# Patient Record
Sex: Female | Born: 1940
Health system: Southern US, Community
[De-identification: ages and names within clinical notes are randomized; demographics above are authoritative.]

## PROBLEM LIST (undated history)

## (undated) DIAGNOSIS — E785 Hyperlipidemia, unspecified: Secondary | ICD-10-CM

## (undated) DIAGNOSIS — M8580 Other specified disorders of bone density and structure, unspecified site: Secondary | ICD-10-CM

## (undated) DIAGNOSIS — C801 Malignant (primary) neoplasm, unspecified: Secondary | ICD-10-CM

## (undated) DIAGNOSIS — H8110 Benign paroxysmal vertigo, unspecified ear: Secondary | ICD-10-CM

## (undated) DIAGNOSIS — M1990 Unspecified osteoarthritis, unspecified site: Secondary | ICD-10-CM

## (undated) DIAGNOSIS — M5020 Other cervical disc displacement, unspecified cervical region: Secondary | ICD-10-CM

## (undated) DIAGNOSIS — I219 Acute myocardial infarction, unspecified: Secondary | ICD-10-CM

## (undated) DIAGNOSIS — I10 Essential (primary) hypertension: Secondary | ICD-10-CM

## (undated) DIAGNOSIS — R112 Nausea with vomiting, unspecified: Secondary | ICD-10-CM

## (undated) DIAGNOSIS — I2510 Atherosclerotic heart disease of native coronary artery without angina pectoris: Secondary | ICD-10-CM

## (undated) DIAGNOSIS — Z9889 Other specified postprocedural states: Secondary | ICD-10-CM

## (undated) DIAGNOSIS — K219 Gastro-esophageal reflux disease without esophagitis: Secondary | ICD-10-CM

## (undated) DIAGNOSIS — M4316 Spondylolisthesis, lumbar region: Secondary | ICD-10-CM

## (undated) HISTORY — DX: Other specified disorders of bone density and structure, unspecified site: M85.80

## (undated) HISTORY — DX: Acute myocardial infarction, unspecified: I21.9

## (undated) HISTORY — PX: BUNIONECTOMY: SHX129

## (undated) HISTORY — DX: Benign paroxysmal vertigo, unspecified ear: H81.10

## (undated) HISTORY — DX: Other cervical disc displacement, unspecified cervical region: M50.20

## (undated) HISTORY — PX: VEIN LIGATION AND STRIPPING: SHX2653

## (undated) HISTORY — PX: CARDIAC CATHETERIZATION: SHX172

## (undated) HISTORY — DX: Hyperlipidemia, unspecified: E78.5

## (undated) HISTORY — DX: Essential (primary) hypertension: I10

---

## 1998-09-12 ENCOUNTER — Other Ambulatory Visit: Admission: RE | Admit: 1998-09-12 | Discharge: 1998-09-12 | Payer: Self-pay | Admitting: Obstetrics and Gynecology

## 1999-09-25 ENCOUNTER — Other Ambulatory Visit: Admission: RE | Admit: 1999-09-25 | Discharge: 1999-09-25 | Payer: Self-pay | Admitting: Obstetrics and Gynecology

## 2000-09-24 ENCOUNTER — Encounter: Payer: Self-pay | Admitting: Specialist

## 2000-09-24 ENCOUNTER — Encounter: Admission: RE | Admit: 2000-09-24 | Discharge: 2000-09-24 | Payer: Self-pay | Admitting: Specialist

## 2000-10-05 ENCOUNTER — Other Ambulatory Visit: Admission: RE | Admit: 2000-10-05 | Discharge: 2000-10-05 | Payer: Self-pay | Admitting: Obstetrics and Gynecology

## 2000-11-19 HISTORY — PX: CERVICAL DISC SURGERY: SHX588

## 2000-11-23 ENCOUNTER — Encounter: Payer: Self-pay | Admitting: Specialist

## 2000-11-23 ENCOUNTER — Encounter (INDEPENDENT_AMBULATORY_CARE_PROVIDER_SITE_OTHER): Payer: Self-pay | Admitting: Specialist

## 2000-11-23 ENCOUNTER — Inpatient Hospital Stay (HOSPITAL_COMMUNITY): Admission: RE | Admit: 2000-11-23 | Discharge: 2000-11-24 | Payer: Self-pay | Admitting: Specialist

## 2001-10-05 ENCOUNTER — Other Ambulatory Visit: Admission: RE | Admit: 2001-10-05 | Discharge: 2001-10-05 | Payer: Self-pay | Admitting: Obstetrics and Gynecology

## 2002-10-16 ENCOUNTER — Other Ambulatory Visit: Admission: RE | Admit: 2002-10-16 | Discharge: 2002-10-16 | Payer: Self-pay | Admitting: Obstetrics and Gynecology

## 2003-10-18 ENCOUNTER — Other Ambulatory Visit: Admission: RE | Admit: 2003-10-18 | Discharge: 2003-10-18 | Payer: Self-pay | Admitting: Obstetrics and Gynecology

## 2003-11-02 ENCOUNTER — Emergency Department (HOSPITAL_COMMUNITY): Admission: AD | Admit: 2003-11-02 | Discharge: 2003-11-02 | Payer: Self-pay | Admitting: Family Medicine

## 2003-12-12 ENCOUNTER — Emergency Department (HOSPITAL_COMMUNITY): Admission: AD | Admit: 2003-12-12 | Discharge: 2003-12-12 | Payer: Self-pay | Admitting: Family Medicine

## 2003-12-16 ENCOUNTER — Inpatient Hospital Stay (HOSPITAL_COMMUNITY): Admission: AD | Admit: 2003-12-16 | Discharge: 2003-12-20 | Payer: Self-pay | Admitting: Internal Medicine

## 2004-10-21 ENCOUNTER — Other Ambulatory Visit: Admission: RE | Admit: 2004-10-21 | Discharge: 2004-10-21 | Payer: Self-pay | Admitting: Obstetrics and Gynecology

## 2004-10-28 ENCOUNTER — Ambulatory Visit: Payer: Self-pay | Admitting: Gastroenterology

## 2004-11-16 ENCOUNTER — Ambulatory Visit: Payer: Self-pay | Admitting: Internal Medicine

## 2004-11-23 ENCOUNTER — Ambulatory Visit: Payer: Self-pay | Admitting: Internal Medicine

## 2004-12-08 ENCOUNTER — Ambulatory Visit: Payer: Self-pay | Admitting: Internal Medicine

## 2004-12-31 ENCOUNTER — Ambulatory Visit: Payer: Self-pay | Admitting: Internal Medicine

## 2005-03-29 ENCOUNTER — Ambulatory Visit: Payer: Self-pay | Admitting: Internal Medicine

## 2005-06-02 ENCOUNTER — Ambulatory Visit: Payer: Self-pay | Admitting: Internal Medicine

## 2005-07-16 ENCOUNTER — Ambulatory Visit: Payer: Self-pay | Admitting: Internal Medicine

## 2005-08-13 ENCOUNTER — Ambulatory Visit: Payer: Self-pay | Admitting: Internal Medicine

## 2005-09-29 ENCOUNTER — Ambulatory Visit: Payer: Self-pay | Admitting: Internal Medicine

## 2005-11-29 ENCOUNTER — Ambulatory Visit: Payer: Self-pay | Admitting: Internal Medicine

## 2006-02-04 ENCOUNTER — Emergency Department (HOSPITAL_COMMUNITY): Admission: EM | Admit: 2006-02-04 | Discharge: 2006-02-05 | Payer: Self-pay | Admitting: *Deleted

## 2006-02-15 ENCOUNTER — Ambulatory Visit: Payer: Self-pay | Admitting: Internal Medicine

## 2006-06-08 ENCOUNTER — Ambulatory Visit: Payer: Self-pay | Admitting: Internal Medicine

## 2006-07-13 ENCOUNTER — Other Ambulatory Visit: Admission: RE | Admit: 2006-07-13 | Discharge: 2006-07-13 | Payer: Self-pay | Admitting: Obstetrics and Gynecology

## 2006-10-05 ENCOUNTER — Ambulatory Visit: Payer: Self-pay | Admitting: Internal Medicine

## 2006-10-05 LAB — CONVERTED CEMR LAB
ALT: 24 units/L (ref 0–40)
AST: 27 units/L (ref 0–37)
Albumin: 3.8 g/dL (ref 3.5–5.2)
Alkaline Phosphatase: 76 units/L (ref 39–117)
BUN: 9 mg/dL (ref 6–23)
Basophils Absolute: 0 10*3/uL (ref 0.0–0.1)
Basophils Relative: 0.5 % (ref 0.0–1.0)
Bilirubin Urine: NEGATIVE
CO2: 29 meq/L (ref 19–32)
Calcium: 9.7 mg/dL (ref 8.4–10.5)
Chloride: 99 meq/L (ref 96–112)
Chol/HDL Ratio, serum: 4.3
Cholesterol: 155 mg/dL (ref 0–200)
Creatinine, Ser: 0.8 mg/dL (ref 0.4–1.2)
Crystals: NEGATIVE
Eosinophil percent: 4.2 % (ref 0.0–5.0)
Epithelial cells, urine: NEGATIVE /lpf
GFR calc non Af Amer: 77 mL/min
Glomerular Filtration Rate, Af Am: 93 mL/min/{1.73_m2}
Glucose, Bld: 109 mg/dL — ABNORMAL HIGH (ref 70–99)
HCT: 43.8 % (ref 36.0–46.0)
HDL: 36.1 mg/dL — ABNORMAL LOW (ref >39.0)
Hemoglobin, Urine: NEGATIVE
Hemoglobin: 14.3 g/dL (ref 12.0–15.0)
Ketones, ur: NEGATIVE mg/dL
LDL Cholesterol: 102 mg/dL — ABNORMAL HIGH (ref 0–99)
Lymphocytes Relative: 29.5 % (ref 12.0–46.0)
MCHC: 32.8 g/dL (ref 30.0–36.0)
MCV: 88.9 fL (ref 78.0–100.0)
Monocytes Absolute: 0.4 10*3/uL (ref 0.2–0.7)
Monocytes Relative: 7.3 % (ref 3.0–11.0)
Mucus, UA: NEGATIVE
Neutro Abs: 3.6 10*3/uL (ref 1.4–7.7)
Neutrophils Relative %: 58.5 % (ref 43.0–77.0)
Nitrite: POSITIVE — AB
Platelets: 501 10*3/uL — ABNORMAL HIGH (ref 150–400)
Potassium: 3.3 meq/L — ABNORMAL LOW (ref 3.5–5.1)
Pro B Natriuretic peptide (BNP): 8 pg/mL (ref 0.0–100.0)
RBC / HPF: NONE SEEN
RBC: 4.92 M/uL (ref 3.87–5.11)
RDW: 13.2 % (ref 11.5–14.6)
Sodium: 136 meq/L (ref 135–145)
Specific Gravity, Urine: 1.015 (ref 1.000–1.03)
TSH: 1.06 microintl units/mL (ref 0.35–5.50)
Total Bilirubin: 1.1 mg/dL (ref 0.3–1.2)
Total Protein, Urine: NEGATIVE mg/dL
Total Protein: 7.4 g/dL (ref 6.0–8.3)
Triglyceride fasting, serum: 85 mg/dL (ref 0–149)
Urine Glucose: NEGATIVE mg/dL
Urobilinogen, UA: 0.2 (ref 0.0–1.0)
VLDL: 17 mg/dL (ref 0–40)
WBC: 6.1 10*3/uL (ref 4.5–10.5)
pH: 7 (ref 5.0–8.0)

## 2006-11-01 ENCOUNTER — Ambulatory Visit (HOSPITAL_BASED_OUTPATIENT_CLINIC_OR_DEPARTMENT_OTHER): Admission: RE | Admit: 2006-11-01 | Discharge: 2006-11-01 | Payer: Self-pay | Admitting: Podiatry

## 2007-01-18 ENCOUNTER — Ambulatory Visit: Payer: Self-pay | Admitting: Internal Medicine

## 2007-03-02 ENCOUNTER — Ambulatory Visit: Payer: Self-pay | Admitting: Pulmonary Disease

## 2007-04-18 ENCOUNTER — Ambulatory Visit: Payer: Self-pay | Admitting: Internal Medicine

## 2007-04-20 ENCOUNTER — Emergency Department (HOSPITAL_COMMUNITY): Admission: EM | Admit: 2007-04-20 | Discharge: 2007-04-20 | Payer: Self-pay | Admitting: Emergency Medicine

## 2007-05-08 ENCOUNTER — Ambulatory Visit: Payer: Self-pay | Admitting: Internal Medicine

## 2007-06-16 ENCOUNTER — Ambulatory Visit: Payer: Self-pay | Admitting: Internal Medicine

## 2007-06-16 LAB — CONVERTED CEMR LAB
ALT: 23 units/L (ref 0–35)
AST: 24 units/L (ref 0–37)
Albumin: 3.8 g/dL (ref 3.5–5.2)
Alkaline Phosphatase: 97 units/L (ref 39–117)
BUN: 9 mg/dL (ref 6–23)
Basophils Absolute: 0.1 10*3/uL (ref 0.0–0.1)
Basophils Relative: 0.8 % (ref 0.0–1.0)
Bilirubin, Direct: 0.1 mg/dL (ref 0.0–0.3)
CO2: 34 meq/L — ABNORMAL HIGH (ref 19–32)
Calcium: 9.8 mg/dL (ref 8.4–10.5)
Chloride: 98 meq/L (ref 96–112)
Creatinine, Ser: 0.7 mg/dL (ref 0.4–1.2)
Eosinophils Absolute: 0.3 10*3/uL (ref 0.0–0.6)
Eosinophils Relative: 4.7 % (ref 0.0–5.0)
GFR calc Af Amer: 108 mL/min
GFR calc non Af Amer: 90 mL/min
Glucose, Bld: 107 mg/dL — ABNORMAL HIGH (ref 70–99)
HCT: 43.4 % (ref 36.0–46.0)
Hemoglobin: 14.8 g/dL (ref 12.0–15.0)
Lymphocytes Relative: 28.1 % (ref 12.0–46.0)
MCHC: 34 g/dL (ref 30.0–36.0)
MCV: 87 fL (ref 78.0–100.0)
Monocytes Absolute: 0.4 10*3/uL (ref 0.2–0.7)
Monocytes Relative: 6.3 % (ref 3.0–11.0)
Neutro Abs: 3.7 10*3/uL (ref 1.4–7.7)
Neutrophils Relative %: 60.1 % (ref 43.0–77.0)
Platelets: 469 10*3/uL — ABNORMAL HIGH (ref 150–400)
Potassium: 3.6 meq/L (ref 3.5–5.1)
RBC: 4.99 M/uL (ref 3.87–5.11)
RDW: 13.2 % (ref 11.5–14.6)
Sodium: 138 meq/L (ref 135–145)
TSH: 1.03 microintl units/mL (ref 0.35–5.50)
Total Bilirubin: 1.1 mg/dL (ref 0.3–1.2)
Total Protein: 7.5 g/dL (ref 6.0–8.3)
WBC: 6.3 10*3/uL (ref 4.5–10.5)

## 2007-08-01 ENCOUNTER — Ambulatory Visit: Payer: Self-pay | Admitting: Internal Medicine

## 2007-08-22 ENCOUNTER — Ambulatory Visit: Payer: Self-pay | Admitting: Internal Medicine

## 2007-09-27 ENCOUNTER — Other Ambulatory Visit: Admission: RE | Admit: 2007-09-27 | Discharge: 2007-09-27 | Payer: Self-pay | Admitting: Obstetrics and Gynecology

## 2007-10-04 ENCOUNTER — Ambulatory Visit: Payer: Self-pay | Admitting: Internal Medicine

## 2007-10-04 LAB — CONVERTED CEMR LAB
ALT: 24 units/L (ref 0–35)
AST: 23 units/L (ref 0–37)
Albumin: 4.1 g/dL (ref 3.5–5.2)
Alkaline Phosphatase: 94 units/L (ref 39–117)
BUN: 8 mg/dL (ref 6–23)
Basophils Absolute: 0 10*3/uL (ref 0.0–0.1)
Basophils Relative: 0.4 % (ref 0.0–1.0)
Bilirubin Urine: NEGATIVE
Bilirubin, Direct: 0.2 mg/dL (ref 0.0–0.3)
CO2: 32 meq/L (ref 19–32)
Calcium: 10 mg/dL (ref 8.4–10.5)
Chloride: 101 meq/L (ref 96–112)
Creatinine, Ser: 0.7 mg/dL (ref 0.4–1.2)
Eosinophils Absolute: 0.3 10*3/uL (ref 0.0–0.6)
Eosinophils Relative: 4.8 % (ref 0.0–5.0)
GFR calc Af Amer: 108 mL/min
GFR calc non Af Amer: 89 mL/min
Glucose, Bld: 103 mg/dL — ABNORMAL HIGH (ref 70–99)
HCT: 42.7 % (ref 36.0–46.0)
Hemoglobin, Urine: NEGATIVE
Hemoglobin: 14.6 g/dL (ref 12.0–15.0)
Ketones, ur: NEGATIVE mg/dL
Leukocytes, UA: NEGATIVE
Lymphocytes Relative: 28.9 % (ref 12.0–46.0)
MCHC: 34.1 g/dL (ref 30.0–36.0)
MCV: 88.5 fL (ref 78.0–100.0)
Monocytes Absolute: 0.4 10*3/uL (ref 0.2–0.7)
Monocytes Relative: 6.1 % (ref 3.0–11.0)
Neutro Abs: 4.2 10*3/uL (ref 1.4–7.7)
Neutrophils Relative %: 59.8 % (ref 43.0–77.0)
Nitrite: NEGATIVE
Platelets: 476 10*3/uL — ABNORMAL HIGH (ref 150–400)
Potassium: 3.9 meq/L (ref 3.5–5.1)
RBC: 4.83 M/uL (ref 3.87–5.11)
RDW: 13.1 % (ref 11.5–14.6)
Sodium: 141 meq/L (ref 135–145)
Specific Gravity, Urine: 1.015 (ref 1.000–1.03)
TSH: 1.11 microintl units/mL (ref 0.35–5.50)
Total Bilirubin: 1.2 mg/dL (ref 0.3–1.2)
Total Protein, Urine: NEGATIVE mg/dL
Total Protein: 8.1 g/dL (ref 6.0–8.3)
Urine Glucose: NEGATIVE mg/dL
Urobilinogen, UA: 0.2 (ref 0.0–1.0)
Vit D, 1,25-Dihydroxy: 31 (ref 30–89)
WBC: 6.9 10*3/uL (ref 4.5–10.5)
pH: 8 (ref 5.0–8.0)

## 2007-10-05 DIAGNOSIS — M899 Disorder of bone, unspecified: Secondary | ICD-10-CM | POA: Insufficient documentation

## 2007-10-05 DIAGNOSIS — E785 Hyperlipidemia, unspecified: Secondary | ICD-10-CM | POA: Insufficient documentation

## 2007-10-05 DIAGNOSIS — I10 Essential (primary) hypertension: Secondary | ICD-10-CM

## 2007-10-05 DIAGNOSIS — M949 Disorder of cartilage, unspecified: Secondary | ICD-10-CM

## 2007-10-05 DIAGNOSIS — J4540 Moderate persistent asthma, uncomplicated: Secondary | ICD-10-CM

## 2007-10-05 DIAGNOSIS — J309 Allergic rhinitis, unspecified: Secondary | ICD-10-CM

## 2007-11-10 DIAGNOSIS — H8110 Benign paroxysmal vertigo, unspecified ear: Secondary | ICD-10-CM | POA: Insufficient documentation

## 2008-01-10 ENCOUNTER — Ambulatory Visit: Payer: Self-pay | Admitting: Internal Medicine

## 2008-01-10 DIAGNOSIS — J069 Acute upper respiratory infection, unspecified: Secondary | ICD-10-CM | POA: Insufficient documentation

## 2008-01-29 ENCOUNTER — Ambulatory Visit: Payer: Self-pay | Admitting: Internal Medicine

## 2008-02-20 ENCOUNTER — Ambulatory Visit: Payer: Self-pay | Admitting: Internal Medicine

## 2008-03-27 ENCOUNTER — Ambulatory Visit: Payer: Self-pay | Admitting: Internal Medicine

## 2008-06-26 ENCOUNTER — Ambulatory Visit: Payer: Self-pay | Admitting: Internal Medicine

## 2008-09-27 ENCOUNTER — Other Ambulatory Visit: Admission: RE | Admit: 2008-09-27 | Discharge: 2008-09-27 | Payer: Self-pay | Admitting: Obstetrics and Gynecology

## 2008-10-09 ENCOUNTER — Ambulatory Visit: Payer: Self-pay | Admitting: Internal Medicine

## 2008-10-10 LAB — CONVERTED CEMR LAB
ALT: 24 units/L (ref 0–35)
AST: 22 units/L (ref 0–37)
Albumin: 3.8 g/dL (ref 3.5–5.2)
Alkaline Phosphatase: 79 units/L (ref 39–117)
BUN: 8 mg/dL (ref 6–23)
Basophils Absolute: 0 10*3/uL (ref 0.0–0.1)
Basophils Relative: 0.4 % (ref 0.0–3.0)
Bilirubin Urine: NEGATIVE
Bilirubin, Direct: 0.2 mg/dL (ref 0.0–0.3)
CO2: 32 meq/L (ref 19–32)
Calcium: 9.5 mg/dL (ref 8.4–10.5)
Chloride: 98 meq/L (ref 96–112)
Cholesterol: 146 mg/dL (ref 0–200)
Creatinine, Ser: 0.7 mg/dL (ref 0.4–1.2)
Eosinophils Absolute: 0.3 10*3/uL (ref 0.0–0.7)
Eosinophils Relative: 4.1 % (ref 0.0–5.0)
GFR calc Af Amer: 108 mL/min
GFR calc non Af Amer: 89 mL/min
Glucose, Bld: 104 mg/dL — ABNORMAL HIGH (ref 70–99)
HCT: 42.7 % (ref 36.0–46.0)
HDL: 39.7 mg/dL (ref >39.0)
Hemoglobin, Urine: NEGATIVE
Hemoglobin: 14.6 g/dL (ref 12.0–15.0)
Ketones, ur: NEGATIVE mg/dL
LDL Cholesterol: 88 mg/dL (ref 0–99)
Leukocytes, UA: NEGATIVE
Lymphocytes Relative: 27.7 % (ref 12.0–46.0)
MCHC: 34.1 g/dL (ref 30.0–36.0)
MCV: 89.6 fL (ref 78.0–100.0)
Monocytes Absolute: 0.3 10*3/uL (ref 0.1–1.0)
Monocytes Relative: 4.9 % (ref 3.0–12.0)
Neutro Abs: 4 10*3/uL (ref 1.4–7.7)
Neutrophils Relative %: 62.9 % (ref 43.0–77.0)
Nitrite: NEGATIVE
Platelets: 421 10*3/uL — ABNORMAL HIGH (ref 150–400)
Potassium: 3.4 meq/L — ABNORMAL LOW (ref 3.5–5.1)
RBC: 4.77 M/uL (ref 3.87–5.11)
RDW: 13 % (ref 11.5–14.6)
Sodium: 139 meq/L (ref 135–145)
Specific Gravity, Urine: <=1.005 (ref 1.000–1.03)
TSH: 0.67 microintl units/mL (ref 0.35–5.50)
Total Bilirubin: 1 mg/dL (ref 0.3–1.2)
Total CHOL/HDL Ratio: 3.7
Total Protein, Urine: NEGATIVE mg/dL
Total Protein: 7.7 g/dL (ref 6.0–8.3)
Triglycerides: 91 mg/dL (ref 0–149)
Urine Glucose: NEGATIVE mg/dL
Urobilinogen, UA: 0.2 (ref 0.0–1.0)
VLDL: 18 mg/dL (ref 0–40)
Vit D, 1,25-Dihydroxy: 38 (ref 30–89)
WBC: 6.3 10*3/uL (ref 4.5–10.5)
pH: 7 (ref 5.0–8.0)

## 2008-12-30 ENCOUNTER — Ambulatory Visit: Payer: Self-pay | Admitting: Internal Medicine

## 2009-01-06 ENCOUNTER — Telehealth (INDEPENDENT_AMBULATORY_CARE_PROVIDER_SITE_OTHER): Payer: Self-pay | Admitting: *Deleted

## 2009-01-28 ENCOUNTER — Ambulatory Visit: Payer: Self-pay | Admitting: Internal Medicine

## 2009-01-31 ENCOUNTER — Telehealth (INDEPENDENT_AMBULATORY_CARE_PROVIDER_SITE_OTHER): Payer: Self-pay | Admitting: *Deleted

## 2009-02-24 ENCOUNTER — Ambulatory Visit: Payer: Self-pay | Admitting: Internal Medicine

## 2009-02-24 DIAGNOSIS — J9811 Atelectasis: Secondary | ICD-10-CM

## 2009-03-24 ENCOUNTER — Ambulatory Visit: Payer: Self-pay | Admitting: Internal Medicine

## 2009-07-21 ENCOUNTER — Ambulatory Visit: Payer: Self-pay | Admitting: Internal Medicine

## 2009-09-17 ENCOUNTER — Telehealth: Payer: Self-pay | Admitting: Internal Medicine

## 2009-09-19 LAB — HM DEXA SCAN

## 2009-10-06 ENCOUNTER — Encounter: Payer: Self-pay | Admitting: Internal Medicine

## 2009-10-06 ENCOUNTER — Ambulatory Visit: Payer: Self-pay | Admitting: Internal Medicine

## 2009-10-21 LAB — HM PAP SMEAR

## 2009-10-27 ENCOUNTER — Ambulatory Visit: Payer: Self-pay | Admitting: Internal Medicine

## 2009-10-27 LAB — CONVERTED CEMR LAB
ALT: 27 units/L (ref 0–35)
AST: 30 units/L (ref 0–37)
Albumin: 4.1 g/dL (ref 3.5–5.2)
Alkaline Phosphatase: 72 units/L (ref 39–117)
BUN: 11 mg/dL (ref 6–23)
Basophils Absolute: 0 10*3/uL (ref 0.0–0.1)
Basophils Relative: 0 % (ref 0.0–3.0)
Bilirubin Urine: NEGATIVE
Bilirubin, Direct: 0.2 mg/dL (ref 0.0–0.3)
CO2: 30 meq/L (ref 19–32)
Calcium: 9.8 mg/dL (ref 8.4–10.5)
Chloride: 95 meq/L — ABNORMAL LOW (ref 96–112)
Cholesterol: 185 mg/dL (ref 0–200)
Creatinine, Ser: 0.7 mg/dL (ref 0.4–1.2)
Eosinophils Absolute: 0.2 10*3/uL (ref 0.0–0.7)
Eosinophils Relative: 3.5 % (ref 0.0–5.0)
GFR calc non Af Amer: 107.29 mL/min (ref >60)
Glucose, Bld: 101 mg/dL — ABNORMAL HIGH (ref 70–99)
HCT: 41.7 % (ref 36.0–46.0)
HDL: 42.9 mg/dL (ref >39.00)
Hemoglobin, Urine: NEGATIVE
Hemoglobin: 14.3 g/dL (ref 12.0–15.0)
Ketones, ur: NEGATIVE mg/dL
LDL Cholesterol: 114 mg/dL — ABNORMAL HIGH (ref 0–99)
Leukocytes, UA: NEGATIVE
Lymphocytes Relative: 20.6 % (ref 12.0–46.0)
Lymphs Abs: 1.3 10*3/uL (ref 0.7–4.0)
MCHC: 34.3 g/dL (ref 30.0–36.0)
MCV: 91 fL (ref 78.0–100.0)
Monocytes Absolute: 0.3 10*3/uL (ref 0.1–1.0)
Monocytes Relative: 4.6 % (ref 3.0–12.0)
Neutro Abs: 4.7 10*3/uL (ref 1.4–7.7)
Neutrophils Relative %: 71.3 % (ref 43.0–77.0)
Nitrite: NEGATIVE
Platelets: 420 10*3/uL — ABNORMAL HIGH (ref 150.0–400.0)
Potassium: 3.2 meq/L — ABNORMAL LOW (ref 3.5–5.1)
RBC: 4.58 M/uL (ref 3.87–5.11)
RDW: 12.9 % (ref 11.5–14.6)
Sodium: 139 meq/L (ref 135–145)
Specific Gravity, Urine: 1.01 (ref 1.000–1.030)
TSH: 0.63 microintl units/mL (ref 0.35–5.50)
Total Bilirubin: 1.1 mg/dL (ref 0.3–1.2)
Total CHOL/HDL Ratio: 4
Total Protein, Urine: NEGATIVE mg/dL
Total Protein: 8.7 g/dL — ABNORMAL HIGH (ref 6.0–8.3)
Triglycerides: 143 mg/dL (ref 0.0–149.0)
Urine Glucose: NEGATIVE mg/dL
Urobilinogen, UA: 1 (ref 0.0–1.0)
VLDL: 28.6 mg/dL (ref 0.0–40.0)
Vit D, 25-Hydroxy: 38 ng/mL (ref 30–89)
WBC: 6.5 10*3/uL (ref 4.5–10.5)
pH: 7.5 (ref 5.0–8.0)

## 2009-11-12 ENCOUNTER — Ambulatory Visit: Payer: Self-pay | Admitting: Internal Medicine

## 2009-12-24 ENCOUNTER — Ambulatory Visit: Payer: Self-pay | Admitting: Internal Medicine

## 2010-01-27 ENCOUNTER — Ambulatory Visit: Payer: Self-pay | Admitting: Internal Medicine

## 2010-05-04 ENCOUNTER — Ambulatory Visit: Payer: Self-pay | Admitting: Internal Medicine

## 2010-05-05 LAB — CONVERTED CEMR LAB
BUN: 10 mg/dL (ref 6–23)
CO2: 30 meq/L (ref 19–32)
Calcium: 9.5 mg/dL (ref 8.4–10.5)
Chloride: 100 meq/L (ref 96–112)
Creatinine, Ser: 0.6 mg/dL (ref 0.4–1.2)
GFR calc non Af Amer: 130.49 mL/min (ref >60)
Glucose, Bld: 87 mg/dL (ref 70–99)
Potassium: 3.8 meq/L (ref 3.5–5.1)
Sodium: 138 meq/L (ref 135–145)

## 2010-08-05 ENCOUNTER — Ambulatory Visit: Payer: Self-pay | Admitting: Internal Medicine

## 2010-10-15 ENCOUNTER — Encounter: Payer: Self-pay | Admitting: Internal Medicine

## 2010-10-23 ENCOUNTER — Encounter: Payer: Self-pay | Admitting: Internal Medicine

## 2010-10-26 ENCOUNTER — Encounter: Payer: Self-pay | Admitting: Internal Medicine

## 2010-10-26 HISTORY — PX: BREAST SURGERY: SHX581

## 2010-11-05 ENCOUNTER — Encounter: Payer: Self-pay | Admitting: Internal Medicine

## 2010-11-05 ENCOUNTER — Ambulatory Visit: Payer: Self-pay | Admitting: Internal Medicine

## 2010-11-06 LAB — CONVERTED CEMR LAB
ALT: 16 units/L (ref 0–35)
AST: 23 units/L (ref 0–37)
Albumin: 4.2 g/dL (ref 3.5–5.2)
Alkaline Phosphatase: 81 units/L (ref 39–117)
BUN: 11 mg/dL (ref 6–23)
Basophils Absolute: 0 10*3/uL (ref 0.0–0.1)
Basophils Relative: 0.3 % (ref 0.0–3.0)
Bilirubin Urine: NEGATIVE
Bilirubin, Direct: 0.1 mg/dL (ref 0.0–0.3)
CO2: 29 meq/L (ref 19–32)
Calcium: 9.9 mg/dL (ref 8.4–10.5)
Chloride: 96 meq/L (ref 96–112)
Cholesterol: 185 mg/dL (ref 0–200)
Creatinine, Ser: 0.8 mg/dL (ref 0.4–1.2)
Eosinophils Absolute: 0.1 10*3/uL (ref 0.0–0.7)
Eosinophils Relative: 1.4 % (ref 0.0–5.0)
GFR calc non Af Amer: 94.4 mL/min (ref >60)
Glucose, Bld: 105 mg/dL — ABNORMAL HIGH (ref 70–99)
HCT: 42 % (ref 36.0–46.0)
HDL: 47.8 mg/dL (ref >39.00)
Hemoglobin, Urine: NEGATIVE
Hemoglobin: 14.7 g/dL (ref 12.0–15.0)
Ketones, ur: NEGATIVE mg/dL
LDL Cholesterol: 121 mg/dL — ABNORMAL HIGH (ref 0–99)
Leukocytes, UA: NEGATIVE
Lymphocytes Relative: 14.9 % (ref 12.0–46.0)
Lymphs Abs: 1.3 10*3/uL (ref 0.7–4.0)
MCHC: 35.1 g/dL (ref 30.0–36.0)
MCV: 90.1 fL (ref 78.0–100.0)
Monocytes Absolute: 0.4 10*3/uL (ref 0.1–1.0)
Monocytes Relative: 5.2 % (ref 3.0–12.0)
Neutro Abs: 6.6 10*3/uL (ref 1.4–7.7)
Neutrophils Relative %: 78.2 % — ABNORMAL HIGH (ref 43.0–77.0)
Nitrite: NEGATIVE
Platelets: 478 10*3/uL — ABNORMAL HIGH (ref 150.0–400.0)
Potassium: 3.6 meq/L (ref 3.5–5.1)
RBC: 4.66 M/uL (ref 3.87–5.11)
RDW: 13.9 % (ref 11.5–14.6)
Sodium: 137 meq/L (ref 135–145)
Specific Gravity, Urine: 1.015 (ref 1.000–1.030)
TSH: 0.97 microintl units/mL (ref 0.35–5.50)
Total Bilirubin: 0.9 mg/dL (ref 0.3–1.2)
Total CHOL/HDL Ratio: 4
Total Protein, Urine: NEGATIVE mg/dL
Total Protein: 7.9 g/dL (ref 6.0–8.3)
Triglycerides: 79 mg/dL (ref 0.0–149.0)
Urine Glucose: NEGATIVE mg/dL
Urobilinogen, UA: 1 (ref 0.0–1.0)
VLDL: 15.8 mg/dL (ref 0.0–40.0)
Vit D, 25-Hydroxy: 58 ng/mL (ref 30–89)
WBC: 8.4 10*3/uL (ref 4.5–10.5)
pH: 6.5 (ref 5.0–8.0)

## 2010-12-30 HISTORY — PX: VULVA /PERINEUM BIOPSY: SHX319

## 2011-01-21 NOTE — Assessment & Plan Note (Signed)
Summary: Primary svc/ refractory rhinitis ? sinusitis   Primary Provider/Referring Provider:  Sherene Sires  CC:  Acute visit.  Pt c/o nasal congestion and sinus pressure.  She states that this has been going on since Nov 2010.  Nasal d/c is green in color.  .  History of Present Illness: 70  yobf never smoker with severe chronic asthma and previous history to suggest marked atopy with IgE level in excess of 10,000 cw abpa.  completed xolair 11/07  December 30, 2008 ov co cough x 3 weeks > thick white mucus, no itching sneezing or wheezing , sob.  >>>changed to Symbicort prednisone taper. and tramadol.   January 28, 2009--Return ov with only minimally improved cough,  Using Mucienx dm and tramadol for cough, helps but comes right back. Is not taking alavert, singulair as recommended by med calendar instructions. Frequent throat clearing, drainage. and cough,  February 24, 2009 no better with round of prednisone and singulair taken in am and using albuterol every 4 hours with only transient relief . rx with singulair and qvar 40 4 puffs bid  in addition to symbicort 160 and better until acute worse 3/31 with sore throat, fever, yellow mucus but improving on as needed list on calendar and not needing rescue rx.  March 24, 2009 ov f/u above changes feeling she's recovering from cold without increase albuterol  rec Switch to the qvar 80  2 puffs first thing  in am and 2 puffs again in pm about 12 hours later when 40 runs out and stay on singulair and symbicort     November 12, 2009----Presents for 2 wks f/u and medication review.  Pt was seen last 2 wks for Allergic Rhinitis with thick yellow drainage from nasal -  doing much better.  Yellow drainage from nasal had resolved.     December 24, 2009 nasal blockage on right transiently better with afrin with recurrent  yellow drainage, no problem with sob.  Pt denies any significant sore throat, dysphagia, itching, sneezing,   fever, chills, sweats,  unintended wt loss, pleuritic or exertional cp, hempoptysis, change in activity tolerance  orthopnea pnd or leg swelling.  Pt also denies any obvious fluctuation in symptoms with weather or environmental change or other alleviating or aggravating factors.     Pt denies any increase in rescue therapy over baseline, denies waking up needing it or having early am exacerbations of coughing/wheezing/ or dyspnea   Current Medications (verified): 1)  Norvasc 10 Mg  Tabs (Amlodipine Besylate) .... One Each Am 2)  Spironolactone-Hctz 25-25 Mg  Tabs (Spironolactone-Hctz) .... Take 2 Tablets By Mouth Once Daily 3)  Clonidine Hcl 0.1 Mg Tabs (Clonidine Hcl) .Marland Kitchen.. 1 Tablet By Mouth Twice A Day 4)  Symbicort 160-4.5 Mcg/act  Aero (Budesonide-Formoterol Fumarate) .... 2 Puffs First Thing  in Am and 2 Puffs Again in Pm About 12 Hours Later 5)  Lipitor 20 Mg  Tabs (Atorvastatin Calcium) .... Take 1/2 Tab By Mouth At Bedtime 6)  Omeprazole 20 Mg  Tbec (Omeprazole) .... Take 1 Tablet By Mouth Once A Day 7)  Calcium 1200-1000 Mg-Unit Chew (Calcium Carbonate-Vit D-Min) .... Once Daily 8)  Rhinocort Aqua 32 Mcg/act Susp (Budesonide) .Marland Kitchen.. 1 Puff Each Nostril Once Daily 9)  Nasonex 50 Mcg/act  Susp (Mometasone Furoate) .Marland Kitchen.. 1 Puff Each Nostril Once Daily 10)  Multivitamins   Tabs (Multiple Vitamin) .... Once Daily 11)  Pepcid 20 Mg Tabs (Famotidine) .Marland Kitchen.. 1 At Bedtime 12)  Singulair 10 Mg  Tabs (Montelukast Sodium) .... Take 1 Tab By Mouth At Bedtime 13)  Qvar 80 Mcg/act Aers (Beclomethasone Dipropionate) .... 2 Puffs Twice Daily 14)  Vitamin D3 1000 Unit Caps (Cholecalciferol) .... Take 1 Capsule By Mouth Once A Day 15)  Dramamine 50 Mg Tabs (Dimenhydrinate) .Marland Kitchen.. 1 Every 8 Hours As Needed 16)  Nasal Saline 0.65 %  Soln (Saline) .... 2 Puffs Every 4 Hrs As Needed 17)  Afrin Nasal Spray 0.05 %  Soln (Oxymetazoline Hcl) .... 2 Puffs Every 12 Hrs As As Needed X 5 Days 18)  Alavert 10 Mg  Tabs (Loratadine) .... Once Daily  As Needed 19)  Proair Hfa 108 (90 Base) Mcg/act Aers (Albuterol Sulfate) .Marland Kitchen.. 1-2 Puffs Every 4-6 Hours As Needed 20)  Mucinex Dm 30-600 Mg  Tb12 (Dextromethorphan-Guaifenesin) .Marland Kitchen.. 1-2 Tablets Every 12 Hrs As Needed 21)  Tramadol Hcl 50 Mg  Tabs (Tramadol Hcl) .Marland Kitchen.. 1 By Mouth Every 4 Hours If Coughing While On Mucinex Dm  Allergies (verified): 1)  ! 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)    -Xolair s  8/05 >  11/07    -Mastered HFA December 30, 2008  ALLERGIC RHINITIS (ICD-477.9) HEALTH MAINTENANCE...................................................................Marland KitchenWert    - Td 10/06    - Pneumovax 10/04 and October 27, 2009     - Colonscopy 10/28/2004    - CPX  10/27/2009    - GYN per [name]       Vital Signs:  Patient profile:   70 year old female Weight:      149 pounds O2 Sat:      96 % on Room air Temp:     98.0 degrees F oral Pulse rate:   96 / minute BP sitting:   130 / 76  (left arm)  Vitals Entered By: Vernie Murders (December 24, 2009 8:54 AM)  O2 Flow:  Room air  Physical Exam  Additional Exam:  GEN: A/Ox3; pleasant , NAD HEENT:  [place]/AT, , EACs-clear, TMs-wnl, NOSE-clear, THROAT-clear upper dentures NECK:  Supple w/ fair ROM; no JVD; normal carotid impulses w/o bruits; no thyromegaly or nodules palpated; no lymphadenopathy. RESP  trace wheezesi. CARD:  RRR, no m/r/g   GI:   Soft & nt; nml bowel sounds; no organomegaly or masses detected. Musco: Warm bil,   no calf tenderness edema, clubbing, pulses intact Neuro: steady gait.   Impression & Recommendations:  Problem # 1:  ALLERGIC RHINITIS (ICD-477.9)  I had an extended discussion with the patient today lasting 15 to 20 minutes of a 25 minute visit on the following issues:  Appropriate use of afrin reviewed. Pcn allergy is rash and swelling, no anaphylaxis so ok to try cephalosporin x 10-20 days with CT of sinus if not better  Her updated medication list for this problem includes:  Rhinocort Aqua 32 Mcg/act Susp (Budesonide) .Marland Kitchen... 1 puff each nostril once daily    Nasonex 50 Mcg/act Susp (Mometasone furoate) .Marland Kitchen... 1 puff each nostril once daily    Nasal Saline 0.65 % Soln (Saline) .Marland Kitchen... 2 puffs every 4 hrs as needed    Afrin Nasal Spray 0.05 % Soln (Oxymetazoline hcl) .Marland Kitchen... 2 puffs every 12 hrs as as needed x 5 days    Alavert 10 Mg Tabs (Loratadine) ..... Once daily as needed  Orders: Est. Patient Level IV (32355)  Problem # 2:  HYPERLIPIDEMIA (ICD-272.4)  Her updated medication list for this problem includes:    Lipitor 20 Mg Tabs (Atorvastatin calcium) .Marland Kitchen... Take 1/2 tab by mouth at bedtime  Labs Reviewed: SGOT: 30 (10/27/2009)   SGPT: 27 (10/27/2009)   HDL:42.90 (10/27/2009), 39.7 (10/09/2008)  LDL:114 (10/27/2009), 88 (73/22/0254)  Chol:185 (10/27/2009), 146 (10/09/2008)  Trig:143.0 (10/27/2009), 91 (10/09/2008)  Orders: Est. Patient Level IV (27062)  Problem # 3:  HYPERTENSION (ICD-401.9) ok on rx  Her updated medication list for this problem includes:    Norvasc 10 Mg Tabs (Amlodipine besylate) ..... One each am    Spironolactone-hctz 25-25 Mg Tabs (Spironolactone-hctz) .Marland Kitchen... Take 2 tablets by mouth once daily    Clonidine Hcl 0.1 Mg Tabs (Clonidine hcl) .Marland Kitchen... 1 tablet by mouth twice a day  Problem # 4:  ASTHMA (ICD-493.90) All goals of asthma met including optimal function and elimination of symptoms with minimum need for rescue therapy. Contingencies discussed  today including the rule of two's.   Medications Added to Medication List This Visit: 1)  Lipitor 20 Mg Tabs (Atorvastatin calcium) .... Take 1/2 tab by mouth at bedtime 2)  Cephalexin 500 Mg Caps (Cephalexin) .... One twice daily for 10 days 3)  Prednisone 10 Mg Tabs (Prednisone) .... 4 each am x 2days, 2x2days, 1x2days and stop  Patient Instructions: 1)  Prednisone 10 mg 4 each am x 2days, 2x2days, 1x2days and stop 2)  Cephalexin 500 twice daily x 10 days if not better call for sinus ct 3)  keep previous appt Prescriptions: PREDNISONE 10 MG  TABS (PREDNISONE) 4 each am x 2days, 2x2days, 1x2days and stop  #14 x 0   Entered and Authorized by:   Nyoka Cowden MD   Signed by:   Nyoka Cowden MD on 12/24/2009   Method used:   Electronically to        [facility] (retail)       [address]       [place], [place]  [id number]       Ph: [phone]       Fax: [phone]   RxID:   [id number] CEPHALEXIN 500 MG CAPS (CEPHALEXIN) one twice daily for 10 days  #20 x 1   Entered and Authorized by:   Nyoka Cowden MD   Signed by:   Nyoka Cowden MD on 12/24/2009   Method used:   Electronically to        [facility] (retail)       [address]       [place], [place]  [address]       Ph: [phone]       Fax: [phone]   RxID:   [id number]

## 2011-01-21 NOTE — Assessment & Plan Note (Signed)
Summary: Primary svc/ ext f/u ov > rx sinusitis:  10-20 d then ? sinus CT   Primary Provider/Referring Provider:  Sherene Sires  CC:  3 month follow up. Pt c/o right nostril congestion with occ green drainage not getting better. Breathing is the same with occ wheezing with relief from inhaler.  History of Present Illness: 70  yobf never smoker with severe chronic asthma and previous history to suggest marked atopy with IgE level in excess of 10,000 cw abpa.  completed xolair 11/07     December 24, 2009 ov  nasal blockage on right transiently better with afrin with recurrent  yellow drainage, no problem with sob. rec prednisone x 6 days and 20 days cephalexin     January 27, 2010 ov all better, no nasal co's or sob or cough.  May 04, 2010 ov  c/o nasal congestion off and on since last seen- afrin and nasal saline help.   no change  August 05, 2010 3 month follow up. Pt c/o right nostril congestion with occ green drainage not getting better. Breathing is the same with occ wheezing with relief from inhaler.  Green drainage always from right side where had surgery on teeth 6 months ago and problems since then. not using much afrin and never saba. Pt denies any significant sore throat, dysphagia, itching, sneezing,  fever, chills, sweats, unintended wt loss, pleuritic or exertional cp, hempoptysis, change in activity tolerance  orthopnea pnd or leg swelling.  Pt also denies any obvious fluctuation in symptoms with weather or environmental change or other alleviating or aggravating factors.        Preventive Screening-Counseling & Management  Alcohol-Tobacco     Smoking Status: never  Current Medications (verified): 1)  Norvasc 10 Mg  Tabs (Amlodipine Besylate) .... One Each Am 2)  Spironolactone-Hctz 25-25 Mg  Tabs (Spironolactone-Hctz) .... Take 2 Tablets By Mouth Once Daily 3)  Clonidine Hcl 0.1 Mg Tabs (Clonidine Hcl) .Marland Kitchen.. 1 Tablet By Mouth Twice A Day 4)  Symbicort 160-4.5 Mcg/act  Aero  (Budesonide-Formoterol Fumarate) .... 2 Puffs First Thing  in Am and 2 Puffs Again in Pm About 12 Hours Later 5)  Lipitor 20 Mg  Tabs (Atorvastatin Calcium) .... Take 1/2 Tab By Mouth At Bedtime 6)  Omeprazole 20 Mg  Tbec (Omeprazole) .... Take 1 Tablet By Mouth Once A Day 7)  Calcium 1200-1000 Mg-Unit Chew (Calcium Carbonate-Vit D-Min) .... Once Daily 8)  Nasonex 50 Mcg/act  Susp (Mometasone Furoate) .Marland Kitchen.. 1 Puff Each Nostril Once Daily 9)  Multivitamins   Tabs (Multiple Vitamin) .... Once Daily 10)  Pepcid 20 Mg Tabs (Famotidine) .Marland Kitchen.. 1 At Bedtime 11)  Singulair 10 Mg  Tabs (Montelukast Sodium) .... Take 1 Tab By Mouth At Bedtime 12)  Qvar 80 Mcg/act Aers (Beclomethasone Dipropionate) .... 2 Puffs Twice Daily 13)  Vitamin D3 1000 Unit Caps (Cholecalciferol) .... Take 1 Capsule By Mouth Once A Day 14)  Dramamine 50 Mg Tabs (Dimenhydrinate) .Marland Kitchen.. 1 Every 8 Hours As Needed 15)  Nasal Saline 0.65 %  Soln (Saline) .... 2 Puffs Every 4 Hrs As Needed 16)  Afrin Nasal Spray 0.05 %  Soln (Oxymetazoline Hcl) .... 2 Puffs Every 12 Hrs As As Needed X 5 Days 17)  Alavert 10 Mg  Tabs (Loratadine) .... Once Daily As Needed 18)  Proair Hfa 108 (90 Base) Mcg/act Aers (Albuterol Sulfate) .Marland Kitchen.. 1-2 Puffs Every 4-6 Hours As Needed 19)  Mucinex Dm 30-600 Mg  Tb12 (Dextromethorphan-Guaifenesin) .Marland Kitchen.. 1-2 Tablets  Every 12 Hrs As Needed 20)  Tramadol Hcl 50 Mg  Tabs (Tramadol Hcl) .Marland Kitchen.. 1 By Mouth Every 4 Hours If Coughing While On Mucinex Dm 21)  Clotrimazole-Betamethasone 1-0.05 % Crea (Clotrimazole-Betamethasone) .... Use As Directed  Allergies (verified): 1)  ! Benadryl 2)  Aspirin (Aspirin) 3)  Codeine Phosphate (Codeine Phosphate) 4)  Penicillin G Potassium (Penicillin G Potassium)  Past History:  Past Medical History: BENIGN POSITIONAL VERTIGO (ICD-386.11) OSTEOPENIA (ICD-733.90)   -  DEXA  08/22/07  AP spine + 1.1,  left femur -1.3, right femur -.8   -  DEXA 10/06/09               + 1.6  left femur -1.6,  right femur -.5  HYPERTENSION (ICD-401.9) HYPERLIPIDEMIA (ICD-272.4)    - Target < 130 ldl pos fm hx, hbp OBESITY    - Target wt  =   178  for BMI < 30  ASTHMA (ICD-493.90) ([name] not active)    -Xolair s  8/05 >  11/07    -Mastered HFA December 30, 2008  ALLERGIC RHINITIS (ICD-477.9) HEALTH MAINTENANCE........................................................................Marland KitchenWert    - Td 10/06    - Pneumovax 10/04 and October 27, 2009     - Colonscopy 10/28/2004    - CPX  10/27/2009    - GYN per [name]       Vital Signs:  Patient profile:   70 year old female Height:      60 inches Weight:      144 pounds BMI:     28.22 O2 Sat:      96 % on Room air Temp:     98.2 degrees F oral Pulse rate:   94 / minute BP sitting:   122 / 80  (right arm) Cuff size:   regular  Vitals Entered By: Zackery Barefoot CMA (August 05, 2010 9:44 AM)  O2 Flow:  Room air CC: 3 month follow up. Pt c/o right nostril congestion with occ green drainage not getting better. Breathing is the same with occ wheezing with relief from inhaler Comments Medications reviewed with patient Verified contact number and pharmacy with patient Zackery Barefoot CMA  August 05, 2010 9:44 AM    Physical Exam  Additional Exam:  wt GEN: A/Ox3; pleasant , NAD wt  150 January 27, 2010 > 147 May 04, 2010 > 144 August 05, 2010  HEENT:  [place]/AT, , EACs-clear, TMs-wnl, NOSE-clear, THROAT-clear upper dentures NECK:  Supple w/ fair ROM; no JVD; normal carotid impulses w/o bruits; no thyromegaly or nodules palpated; no lymphadenopathy. RESP lungs clear bilaterally CARD:  RRR, no m/r/g   GI:   Soft & nt; nml bowel sounds; no organomegaly or masses detected. Musco: Warm bil,  no calf tenderness edema, clubbing, pulses intact     Impression & Recommendations:  Problem # 1:  ASTHMA (ICD-493.90) All goals of asthma met including optimal function and elimination of symptoms with minimum need for rescue therapy. Contingencies  discussed today including the rule of two's.    Each maintenance medication was reviewed in detail including most importantly the difference between maintenance and as needed and under what circumstances the prns are to be used. This was done in the context of a medication calendar review which provided the patient with a user-friendly unambiguous mechanism for medication administration and reconciliation and provides an action plan for all active problems. It is critical that this be shown to every doctor  for modification during the office visit if  necessary so the patient can use it as a working document.   Problem # 2:  ALLERGIC RHINITIS (ICD-477.9)  Her updated medication list for this problem includes:    Nasonex 50 Mcg/act Susp (Mometasone furoate) .Marland Kitchen... 1 puff each nostril once daily    Nasal Saline 0.65 % Soln (Saline) .Marland Kitchen... 2 puffs every 4 hrs as needed    Afrin Nasal Spray 0.05 % Soln (Oxymetazoline hcl) .Marland Kitchen... 2 puffs every 12 hrs as as needed x 5 days    Alavert 10 Mg Tabs (Loratadine) ..... Once daily as needed  Persistent symptoms isolated to Right sinuses after dental surgery so rx with abx then sinus ct if not completely better  Orders: Est. Patient Level IV ([address]109)  Medications Added to Medication List This Visit: 1)  Cefprozil 250 Mg Tabs (Cefprozil) .... One twice daily x 10 days  Patient Instructions: 1)  See calendar for specific medication instructions and bring it back for each and every office visit for every healthcare provider you see.  Without it,  you may not receive the best quality medical care that we feel you deserve.  2)  Antibiotics x 10-20 days, if not better call [facility] at [phone] to schedule CT Sinus full coronal  3)  Return to office in 3 months, sooner if needed with cpx on return Prescriptions: CEFPROZIL 250 MG TABS (CEFPROZIL) one twice daily x 10 days  #20 x 1   Entered and Authorized by:   Nyoka Cowden MD   Signed by:   Nyoka Cowden MD on  08/05/2010   Method used:   Electronically to        [facility] (retail)       [address]       [place], [place]  [id number]       Ph: [phone]       Fax: [phone]   RxID:   [id number]

## 2011-01-21 NOTE — Assessment & Plan Note (Signed)
Summary: Primary svc/ f/u hbp/ asthma/cr   Primary Provider/Referring Provider:  Sherene Sires  CC:  3 month followup.  Pt c/o nasal congestion off and on since last seen- afrin and nasal saline help.  No other complaints today.Marland Kitchen  History of Present Illness: 89  yobf never smoker with severe chronic asthma and previous history to suggest marked atopy with IgE level in excess of 10,000 cw abpa.  completed xolair 11/07     December 24, 2009 ov  nasal blockage on right transiently better with afrin with recurrent  yellow drainage, no problem with sob. rec prednisone x 6 days and 20 days cephalexin     January 27, 2010 ov all better, no nasal co's or sob or cough.  May 04, 2010 3 month followup.  Pt c/o nasal congestion off and on since last seen- afrin and nasal saline help.  No other complaints today.  Pt denies any increase in rescue therapy over baseline, denies waking up needing it or having early am exacerbations of coughing/wheezing/ or dyspnea. Pt also denies any obvious fluctuation in symptoms with weather or environmental change or other alleviating or aggravating factors.          Current Medications (verified): 1)  Norvasc 10 Mg  Tabs (Amlodipine Besylate) .... One Each Am 2)  Spironolactone-Hctz 25-25 Mg  Tabs (Spironolactone-Hctz) .... Take 2 Tablets By Mouth Once Daily 3)  Clonidine Hcl 0.1 Mg Tabs (Clonidine Hcl) .Marland Kitchen.. 1 Tablet By Mouth Twice A Day 4)  Symbicort 160-4.5 Mcg/act  Aero (Budesonide-Formoterol Fumarate) .... 2 Puffs First Thing  in Am and 2 Puffs Again in Pm About 12 Hours Later 5)  Lipitor 20 Mg  Tabs (Atorvastatin Calcium) .... Take 1/2 Tab By Mouth At Bedtime 6)  Omeprazole 20 Mg  Tbec (Omeprazole) .... Take 1 Tablet By Mouth Once A Day 7)  Calcium 1200-1000 Mg-Unit Chew (Calcium Carbonate-Vit D-Min) .... Once Daily 8)  Nasonex 50 Mcg/act  Susp (Mometasone Furoate) .Marland Kitchen.. 1 Puff Each Nostril Once Daily 9)  Multivitamins   Tabs (Multiple Vitamin) .... Once Daily 10)   Pepcid 20 Mg Tabs (Famotidine) .Marland Kitchen.. 1 At Bedtime 11)  Singulair 10 Mg  Tabs (Montelukast Sodium) .... Take 1 Tab By Mouth At Bedtime 12)  Qvar 80 Mcg/act Aers (Beclomethasone Dipropionate) .... 2 Puffs Twice Daily 13)  Vitamin D3 1000 Unit Caps (Cholecalciferol) .... Take 1 Capsule By Mouth Once A Day 14)  Dramamine 50 Mg Tabs (Dimenhydrinate) .Marland Kitchen.. 1 Every 8 Hours As Needed 15)  Nasal Saline 0.65 %  Soln (Saline) .... 2 Puffs Every 4 Hrs As Needed 16)  Afrin Nasal Spray 0.05 %  Soln (Oxymetazoline Hcl) .... 2 Puffs Every 12 Hrs As As Needed X 5 Days 17)  Alavert 10 Mg  Tabs (Loratadine) .... Once Daily As Needed 18)  Proair Hfa 108 (90 Base) Mcg/act Aers (Albuterol Sulfate) .Marland Kitchen.. 1-2 Puffs Every 4-6 Hours As Needed 19)  Mucinex Dm 30-600 Mg  Tb12 (Dextromethorphan-Guaifenesin) .Marland Kitchen.. 1-2 Tablets Every 12 Hrs As Needed 20)  Tramadol Hcl 50 Mg  Tabs (Tramadol Hcl) .Marland Kitchen.. 1 By Mouth Every 4 Hours If Coughing While On Mucinex Dm 21)  Clotrimazole-Betamethasone 1-0.05 % Crea (Clotrimazole-Betamethasone) .... Use As Directed  Allergies (verified): 1)  ! 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)    -Xolair s  8/05 >  11/07    -Mastered HFA December 30, 2008  ALLERGIC RHINITIS (ICD-477.9) HEALTH MAINTENANCE......................................................................Marland KitchenWert    - Td 10/06    - Pneumovax 10/04 and October 27, 2009     - Colonscopy 10/28/2004    - CPX  10/27/2009    - GYN per C  Romine       Vital Signs:  Patient profile:   70 year old female Weight:      147 pounds O2 Sat:      95 % on Room air Temp:     98.0 degrees F oral Pulse rate:   84 / minute BP sitting:   120 / 80  (left arm)  Vitals Entered By: Vernie Murders (May 04, 2010 9:32 AM)  O2 Flow:  Room air  Physical Exam  Additional Exam:  GEN: A/Ox3; pleasant , NAD wt 149 > 150 January 27, 2010 > 147 May 04, 2010  HEENT:  [place]/AT, , [employer] forces training [employer], TMs-wnl, NOSE-clear, THROAT-clear upper dentures NECK:  Supple w/ fair ROM; no JVD; normal carotid impulses w/o bruits; no thyromegaly or nodules palpated; no lymphadenopathy. RESP lungs clear bilaterally CARD:  RRR, no m/r/g   GI:   Soft & nt; nml bowel sounds; no organomegaly or masses detected. Musco: Warm bil,  no calf tenderness edema, clubbing, pulses intact     Sodium                    138 mEq/L                   135-145   Potassium                 3.8 mEq/L                   3.5-5.1   Chloride                  100 mEq/L                   96-112   Carbon Dioxide            30 mEq/L                    19-32   Glucose                   87 mg/dL                    60-45   BUN                       10 mg/dL                    4-09   Creatinine                0.6 mg/dL  0.4-1.2   Calcium                   9.5 mg/dL                   0.4-54.0   GFR                       130.49 mL/min               >60  Impression & Recommendations:  Problem # 1:  ASTHMA (ICD-493.90) All goals of asthma met including optimal function and elimination of symptoms with minimum need for rescue therapy. Contingencies discussed today including the rule of two's.   Each maintenance medication was reviewed in detail including most importantly the difference between maintenance prns and under what circumstances the prns are to be used.  In addition, these two groups (for which the patient should keep up with refills) were distinguished from a third group :   meds that are used only short term with the intent to complete a course of therapy and then not refill them.  The med list was then fully reconciled and reorganized to reflect this important distinction.   Problem # 2:  ALLERGIC RHINITIS (ICD-477.9)  The following medications were removed from the medication list:    Rhinocort Aqua 32 Mcg/act Susp (Budesonide) .Marland Kitchen... 1 puff each nostril once daily Her updated medication list for this problem includes:    Nasonex 50 Mcg/act Susp (Mometasone furoate) .Marland Kitchen... 1 puff each nostril once daily    Nasal Saline 0.65 % Soln (Saline) .Marland Kitchen... 2 puffs every 4 hrs as needed    Afrin Nasal Spray 0.05 % Soln (Oxymetazoline hcl) .Marland Kitchen... 2 puffs every 12 hrs as as needed x 5 days    Alavert 10 Mg Tabs (Loratadine) ..... Once daily as needed  I emphasized that nasal steroids have no immediate benefit in terms of improving symptoms.  To help them reached the target tissue, the patient should use Afrin two puffs every 12 hours applied one min before using the nasal steroids.  Afrin should be stopped after no more than 5 days.  If the symptoms worsen, Afrin can be restarted after 5 days off of therapy to prevent rebound congestion from overuse of Afrin.  I also emphasized that in no way are nasal steroids a concern in terms of "addiction".   See instructions for specific recommendations   Orders: Est. Patient Level III (98119)  Problem # 3:  HYPERTENSION (ICD-401.9)  Her updated medication list for this problem includes:    Norvasc 10 Mg Tabs (Amlodipine besylate) ..... One each am    Spironolactone-hctz 25-25 Mg Tabs (Spironolactone-hctz) .Marland Kitchen... Take 2 tablets by mouth once daily    Clonidine Hcl 0.1 Mg Tabs (Clonidine hcl) .Marland Kitchen... 1 tablet by mouth twice a day  Orders: TLB-BMP (Basic Metabolic Panel-BMET) (80048-METABOL) Est. Patient Level III (14782)  Patient Instructions: 1)  See calendar for specific medication instructions and bring it back for each and every  office visit for every healthcare provider you see.  Without it,  you may not receive the best quality medical care that we feel you deserve.  2)  Return to office in 3 months, sooner if needed

## 2011-01-21 NOTE — Assessment & Plan Note (Signed)
Summary: Primary svc/ f/u hyperlipidemia, hbp, asthma   Primary Provider/Referring Provider:  Sherene Sires  CC:  3 mth rov - breathing doing good.  History of Present Illness: 70  yobf never smoker with severe chronic asthma and previous history to suggest marked atopy with IgE level in excess of 10,000 cw abpa.  completed xolair 11/07  December 30, 2008 ov co cough x 3 weeks > thick white mucus, no itching sneezing or wheezing , sob.  >>>changed to Symbicort prednisone taper. and tramadol.   January 28, 2009--Return ov with only minimally improved cough,  Using Mucienx dm and tramadol for cough, helps but comes right back. Is not taking alavert, singulair as recommended by med calendar instructions. Frequent throat clearing, drainage. and cough,  February 24, 2009 no better with round of prednisone and singulair taken in am and using albuterol every 4 hours with only transient relief . rx with singulair and qvar 40 4 puffs bid  in addition to symbicort 160 and better until acute worse 3/31 with sore throat, fever, yellow mucus but improving on as needed list on calendar and not needing rescue rx.  March 24, 2009 ov f/u above changes feeling she's recovering from cold without increase albuterol  rec Switch to the qvar 80  2 puffs first thing  in am and 2 puffs again in pm about 12 hours later when 40 runs out and stay on singulair and symbicort   December 24, 2009 ov  nasal blockage on right transiently better with afrin with recurrent  yellow drainage, no problem with sob. rec prednisone x 6 days and 20 days cephalexin and no sinus ct   January 27, 2010 ov all better, no nasal co's or sob or cough. Pt denies any significant sore throat, dysphagia, itching, sneezing,  nasal congestion or excess secretions,  fever, chills, sweats, unintended wt loss, pleuritic or exertional cp, hempoptysis, change in activity tolerance  orthopnea pnd or leg swelling           Current Medications  (verified): 1)  Norvasc 10 Mg  Tabs (Amlodipine Besylate) .... One Each Am 2)  Spironolactone-Hctz 25-25 Mg  Tabs (Spironolactone-Hctz) .... Take 2 Tablets By Mouth Once Daily 3)  Clonidine Hcl 0.1 Mg Tabs (Clonidine Hcl) .Marland Kitchen.. 1 Tablet By Mouth Twice A Day 4)  Symbicort 160-4.5 Mcg/act  Aero (Budesonide-Formoterol Fumarate) .... 2 Puffs First Thing  in Am and 2 Puffs Again in Pm About 12 Hours Later 5)  Lipitor 20 Mg  Tabs (Atorvastatin Calcium) .... Take 1/2 Tab By Mouth At Bedtime 6)  Omeprazole 20 Mg  Tbec (Omeprazole) .... Take 1 Tablet By Mouth Once A Day 7)  Calcium 1200-1000 Mg-Unit Chew (Calcium Carbonate-Vit D-Min) .... Once Daily 8)  Rhinocort Aqua 32 Mcg/act Susp (Budesonide) .Marland Kitchen.. 1 Puff Each Nostril Once Daily 9)  Nasonex 50 Mcg/act  Susp (Mometasone Furoate) .Marland Kitchen.. 1 Puff Each Nostril Once Daily 10)  Multivitamins   Tabs (Multiple Vitamin) .... Once Daily 11)  Pepcid 20 Mg Tabs (Famotidine) .Marland Kitchen.. 1 At Bedtime 12)  Singulair 10 Mg  Tabs (Montelukast Sodium) .... Take 1 Tab By Mouth At Bedtime 13)  Qvar 80 Mcg/act Aers (Beclomethasone Dipropionate) .... 2 Puffs Twice Daily 14)  Vitamin D3 1000 Unit Caps (Cholecalciferol) .... Take 1 Capsule By Mouth Once A Day 15)  Dramamine 50 Mg Tabs (Dimenhydrinate) .Marland Kitchen.. 1 Every 8 Hours As Needed 16)  Nasal Saline 0.65 %  Soln (Saline) .... 2 Puffs Every  4 Hrs As Needed 17)  Afrin Nasal Spray 0.05 %  Soln (Oxymetazoline Hcl) .... 2 Puffs Every 12 Hrs As As Needed X 5 Days 18)  Alavert 10 Mg  Tabs (Loratadine) .... Once Daily As Needed 19)  Proair Hfa 108 (90 Base) Mcg/act Aers (Albuterol Sulfate) .Marland Kitchen.. 1-2 Puffs Every 4-6 Hours As Needed 20)  Mucinex Dm 30-600 Mg  Tb12 (Dextromethorphan-Guaifenesin) .Marland Kitchen.. 1-2 Tablets Every 12 Hrs As Needed 21)  Tramadol Hcl 50 Mg  Tabs (Tramadol Hcl) .Marland Kitchen.. 1 By Mouth Every 4 Hours If Coughing While On Mucinex Dm  Allergies (verified): 1)  ! 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)    -Xolair s  8/05 >  11/07    -Mastered HFA December 30, 2008  ALLERGIC RHINITIS (ICD-477.9) HEALTH MAINTENANCE....................................................................Marland KitchenWert    - Td 10/06    - Pneumovax 10/04 and October 27, 2009     - Colonscopy 10/28/2004    - CPX  10/27/2009    - GYN per [name]       Vital Signs:  Patient profile:   70 year old female Height:      60 inches Weight:      150 pounds O2 Sat:      97 % on Room air Temp:     97.8 degrees F oral Pulse rate:   106 / minute BP sitting:   148 / 80  (left arm)  Vitals Entered By: Abigail Miyamoto RN (January 27, 2010 9:27 AM)  O2 Flow:  Room air  Physical Exam  Additional Exam:  GEN: A/Ox3; pleasant , NAD wt 149 > 150 January 27, 2010  HEENT:  [place]/AT, , EACs-clear, TMs-wnl, NOSE-clear, THROAT-clear upper dentures NECK:  Supple w/ fair ROM; no JVD; normal carotid impulses w/o bruits; no thyromegaly or nodules palpated; no lymphadenopathy. RESP  trace wheezesi. CARD:  RRR, no m/r/g   GI:   Soft & nt; nml bowel sounds; no organomegaly or masses detected. Musco: Warm bil,  no calf tenderness edema, clubbing, pulses intact Neuro: steady gait.   Impression & Recommendations:  Problem # 1:  HYPERLIPIDEMIA (ICD-272.4)  ok to refill  Her updated medication list for this problem includes:    Lipitor 20 Mg Tabs (Atorvastatin calcium) .Marland Kitchen... Take 1/2 tab by mouth at bedtime  Labs Reviewed: SGOT: 30 (10/27/2009)   SGPT: 27  (10/27/2009)   HDL:42.90 (10/27/2009), 39.7 (10/09/2008)  LDL:114 (10/27/2009), 88 (16/09/9603)  Chol:185 (10/27/2009), 146 (10/09/2008)  Trig:143.0 (10/27/2009), 91 (10/09/2008)  Orders: Est. Patient Level III (54098)  Problem # 2:  ASTHMA (ICD-493.90) All goals of asthma met including optimal function and elimination of symptoms with minimum need for rescue therapy. Contingencies discussed today including the rule of two's.   Problem # 3:  HYPERTENSION (ICD-401.9)  ok control on rx  Her updated  medication list for this problem includes:    Norvasc 10 Mg Tabs (Amlodipine besylate) ..... One each am    Spironolactone-hctz 25-25 Mg Tabs (Spironolactone-hctz) .Marland Kitchen... Take 2 tablets by mouth once daily    Clonidine Hcl 0.1 Mg Tabs (Clonidine hcl) .Marland Kitchen... 1 tablet by mouth twice a day  Orders: Est. Patient Level III (16109)  Medications Added to Medication List This Visit: 1)  Spironolactone-hctz 25-25 Mg Tabs (Spironolactone-hctz) .... Take 2 tablets by mouth once daily  Patient Instructions: 1)  See calendar for specific medication instructions and bring it back for each and every office visit for every healthcare provider you see.  Without it,  you may not receive the best quality medical care that we feel you deserve. 2)  Return to office in 3 months, sooner if needed  Prescriptions: SPIRONOLACTONE-HCTZ 25-25 MG  TABS (SPIRONOLACTONE-HCTZ) Take 2 tablets by mouth once daily  #68 x 11   Entered and Authorized by:   Nyoka Cowden MD   Signed by:   Nyoka Cowden MD on 01/27/2010   Method used:   Electronically to        [facility] (retail)       [address]       [place], [place]  [address]       Ph: [phone]       Fax: [phone]   RxID:   [id number] NORVASC 10 MG  TABS (AMLODIPINE BESYLATE) one each am  #100 x 3   Entered and Authorized by:   Nyoka Cowden MD   Signed by:   Nyoka Cowden MD on 01/27/2010   Method used:   Electronically  to        [facility] (retail)       [address]       [place], [place]  [id number]       Ph: [phone]       Fax: [phone]   RxID:   [id number] LIPITOR 20 MG  TABS (ATORVASTATIN CALCIUM) Take 1/2 tab by mouth at bedtime  #15 x 11   Entered and Authorized by:   Nyoka Cowden MD   Signed by:   Nyoka Cowden MD on 01/27/2010   Method used:   Electronically to        [facility] (retail)       [address]       [place], [place]  [id number]       Ph: [phone]       Fax: [phone]   RxID:   [id number]

## 2011-01-21 NOTE — Assessment & Plan Note (Signed)
Summary: Primary svc/ cpx     Primary Provider/Referring Provider:  Sherene Sires  CC:  cpx fasting.  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  completed xolair 11/07     December 24, 2009 ov  nasal blockage on right transiently better with afrin with recurrent  yellow drainage, no problem with sob. rec prednisone x 6 days and 20 days cephalexin     January 27, 2010 ov all better, no nasal co's or sob or cough.  May 04, 2010 ov  c/o nasal congestion off and on since last seen- afrin and nasal saline help.   no change  August 05, 2010 3 month follow up. Pt c/o right nostril congestion with occ green drainage not getting better. Breathing is the same with occ wheezing with relief from inhaler.  Green drainage always from right side where had surgery on teeth 6 months ago and problems since then. not using much afrin and never saba. rx x 20 days, resolved s need for sinus ct   November 05, 2010 cpx mild uri but no purulent sputum. Pt denies any significant sore throat, dysphagia, itching, sneezing,  nasal congestion or excess secretions,  fever, chills, sweats, unintended wt loss, pleuritic or exertional cp, hempoptysis, change in activity tolerance  orthopnea pnd or leg swelling       Current Medications (verified): 1)  Norvasc 10 Mg  Tabs (Amlodipine Besylate) .... One Each Am 2)  Spironolactone-Hctz 25-25 Mg  Tabs (Spironolactone-Hctz) .... Take 2 Tablets By Mouth Once Daily 3)  Clonidine Hcl 0.1 Mg Tabs (Clonidine Hcl) .Marland Kitchen.. 1 Tablet By Mouth Twice A Day 4)  Symbicort 160-4.5 Mcg/act  Aero (Budesonide-Formoterol Fumarate) .... 2 Puffs First Thing  in Am and 2 Puffs Again in Pm About 12 Hours Later 5)  Lipitor 20 Mg  Tabs (Atorvastatin Calcium) .... Take 1/2 Tab By Mouth At Bedtime 6)  Omeprazole 20 Mg  Tbec (Omeprazole) .... Take 1 Tablet By Mouth Once A Day 7)  Calcium  1200-1000 Mg-Unit Chew (Calcium Carbonate-Vit D-Min) .... Once Daily 8)  Nasonex 50 Mcg/act  Susp (Mometasone Furoate) .Marland Kitchen.. 1 Puff Each Nostril Once Daily 9)  Multivitamins   Tabs (Multiple Vitamin) .... Once Daily 10)  Pepcid 20 Mg Tabs (Famotidine) .Marland Kitchen.. 1 At Bedtime 11)  Singulair 10 Mg  Tabs (Montelukast Sodium) .... Take 1 Tab By Mouth At Bedtime 12)  Qvar 80 Mcg/act Aers (Beclomethasone Dipropionate) .... 2 Puffs Twice Daily 13)  Vitamin D3 1000 Unit Caps (Cholecalciferol) .... Take 1 Capsule By Mouth Once A Day 14)  Dramamine 50 Mg Tabs (Dimenhydrinate) .Marland Kitchen.. 1 Every 8 Hours As Needed 15)  Nasal Saline 0.65 %  Soln (Saline) .... 2 Puffs Every 4 Hrs As Needed 16)  Afrin Nasal Spray 0.05 %  Soln (Oxymetazoline Hcl) .... 2 Puffs Every 12 Hrs As As Needed X 5 Days 17)  Alavert 10 Mg  Tabs (Loratadine) .... Once Daily As Needed 18)  Proair Hfa 108 (90 Base) Mcg/act Aers (Albuterol Sulfate) .Marland Kitchen.. 1-2 Puffs Every 4-6 Hours As Needed 19)  Mucinex Dm 30-600 Mg  Tb12 (Dextromethorphan-Guaifenesin) .Marland Kitchen.. 1-2 Tablets Every 12 Hrs As Needed 20)  Tramadol Hcl 50 Mg  Tabs (Tramadol Hcl) .Marland Kitchen.. 1 By Mouth Every 4 Hours If Coughing While On Mucinex Dm 21)  Clotrimazole-Betamethasone 1-0.05 % Crea (Clotrimazole-Betamethasone) .... Use As Directed  Allergies (verified):  1)  ! 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)    -Xolair s  8/05 >  11/07    -Mastered HFA December 30, 2008  ALLERGIC RHINITIS  (ICD-477.9) HEALTH MAINTENANCE........................................................................Marland KitchenWert    - Td 10/06    - Pneumovax 10/04 and October 27, 2009     - Colonscopy 10/28/2004    - CPX  November 05, 2010     - GYN per [name]       Vital Signs:  Patient profile:   70 year old female Height:      59 inches Weight:      139 pounds BMI:     28.18 O2 Sat:      96 % on Room air Temp:     98.1 degrees F oral Pulse rate:   83 / minute BP sitting:   118 / 78  (left arm)  Vitals Entered By: Vernie Murders (November 05, 2010 8:52 AM)  O2 Flow:  Room air  Physical Exam  Additional Exam:  wt GEN: A/Ox3; pleasant , NAD wt  150 January 27, 2010 > 147 May 04, 2010 > 144 August 05, 2010 > 139 November 05, 2010  HEENT:  [place]/AT, , [employer], TMs-wnl, NOSE-clear, THROAT-clear upper dentures NECK:  Supple w/ fair ROM; no JVD; normal carotid impulses w/o bruits; no thyromegaly or nodules palpated; no lymphadenopathy. RESP lungs clear bilaterally CARD:  RRR, no m/r/g   GI:   Soft & nt; nml bowel sounds; no organomegaly or masses detected. Musco: Warm bil,  no calf tenderness edema, clubbing, pulses intact Skin No rash Neuro:  nl gait, sensorium GU:  per GYN      Cholesterol               185 mg/dL                   1-610     ATP III Classification            Desirable:  < 200 mg/dL                    Borderline High:  200 - 239 mg/dL               High:  > = 240 mg/dL   Triglycerides             79.0 mg/dL                  9.6-045.4     Normal:  <150 mg/dL     Borderline High:  098 - 199 mg/dL   HDL                       11.91 mg/dL                 >  39.00   VLDL Cholesterol          15.8 mg/dL                  6.2-95.2   LDL Cholesterol      [H]  841 mg/dL                   3-24  CHO/HDL Ratio:  CHD Risk                             4                    Men          Women     1/2 Average Risk     3.4          3.3     Average Risk          5.0          4.4     2X  Average Risk          9.6          7.1     3X Average Risk          15.0          11.0                           Tests: (2) BMP (METABOL)   Sodium                    137 mEq/L                   135-145   Potassium                 3.6 mEq/L                   3.5-5.1   Chloride                  96 mEq/L                    96-112   Carbon Dioxide            29 mEq/L                    19-32   Glucose              [H]  105 mg/dL                   40-10   BUN                       11 mg/dL                    2-72   Creatinine                0.8 mg/dL                   5.3-6.6   Calcium                   9.9 mg/dL                   4.4-03.4   GFR  94.40 mL/min                >60  Tests: (3) CBC Platelet w/Diff (CBCD)   White Cell Count          8.4 K/uL                    4.5-10.5   Red Cell Count            4.66 Mil/uL                 3.87-5.11   Hemoglobin                14.7 g/dL                   16.1-09.6   Hematocrit                42.0 %                      36.0-46.0   MCV                       90.1 fl                     78.0-100.0   MCHC                      35.1 g/dL                   04.5-40.9   RDW                       13.9 %                      11.5-14.6   Platelet Count       [H]  478.0 K/uL                  150.0-400.0   Neutrophil %         [H]  78.2 %                      43.0-77.0   Lymphocyte %              14.9 %                      12.0-46.0   Monocyte %                5.2 %                       3.0-12.0   Eosinophils%              1.4 %                       0.0-5.0   Basophils %               0.3 %                       0.0-3.0   Neutrophill Absolute      6.6 K/uL                    1.4-7.7   Lymphocyte Absolute       1.3  K/uL                    0.7-4.0   Monocyte Absolute         0.4 K/uL                    0.1-1.0  Eosinophils, Absolute                             0.1 K/uL                    0.0-0.7   Basophils Absolute        0.0 K/uL                     0.0-0.1  Tests: (4) Hepatic/Liver Function Panel (HEPATIC)   Total Bilirubin           0.9 mg/dL                   1.6-1.0   Direct Bilirubin          0.1 mg/dL                   9.6-0.4   Alkaline Phosphatase      81 U/L                      39-117   AST                       23 U/L                      0-37   ALT                       16 U/L                      0-35   Total Protein             7.9 g/dL                    5.4-0.9   Albumin                   4.2 g/dL                    8.1-1.9  Tests: (5) TSH (TSH)   FastTSH                   0.97 uIU/mL                 0.35-5.50  Tests: (6) UDip Only (UDIP)   Color                     LT. YELLOW       RANGE:  Yellow;Lt. Yellow   Clarity                   CLEAR                       Clear   Specific Gravity          1.015                       1.000 - 1.030  Urine Ph                  6.5                         5.0-8.0   Protein                   NEGATIVE                    Negative   Urine Glucose             NEGATIVE                    Negative   Ketones                   NEGATIVE                    Negative   Urine Bilirubin           NEGATIVE                    Negative   Blood                     NEGATIVE                    Negative   Urobilinogen              1.0                         0.0 - 1.0   Leukocyte Esterace        NEGATIVE                    Negative   Nitrite                   NEGATIVE                    Negative  CXR  Procedure date:  11/05/2010  Findings:      1. No acute cardiopulmonary disease. 2.  Chronic right middle lobe volume loss.  Impression & Recommendations:  Problem # 1:  ASTHMA (ICD-493.90)  All goals of asthma met including optimal function and elimination of symptoms with minimum need for rescue therapy. Contingencies discussed today including the rule of two's.       Problem # 2:  HYPERLIPIDEMIA (ICD-272.4) Target < 130 due to fm hx and hbp  Her updated medication  list for this problem includes:    Lipitor 20 Mg Tabs (Atorvastatin calcium) .Marland Kitchen... Take 1/2 tab by mouth at bedtime  Labs Reviewed: SGOT: 30 (10/27/2009)   SGPT: 27 (10/27/2009)   HDL:42.90 (10/27/2009), 39.7 (10/09/2008)  LDL:114 (10/27/2009), 88 (16/09/9603)>  121 November 05 2010   Chol:185 (10/27/2009), 146 (10/09/2008)  Trig:143.0 (10/27/2009), 91 (10/09/2008)  Problem # 3:  ABNORMAL LUNG XRAY (ICD-793.1) No change discoid atx rml never smoker with ? ABPA so no further w/u needed, just treat the asthma  Problem # 4:  OSTEOPENIA (ICD-733.90)   Vit d >  58 so no change in rx  repeat  dexa due 10/2011  Problem # 5:  ALLERGIC RHINITIS (ICD-477.9)  Her updated medication list for this problem includes:    Nasonex 50 Mcg/act Susp (Mometasone furoate) .Marland Kitchen... 1 puff each nostril once  daily    Nasal Saline 0.65 % Soln (Saline) .Marland Kitchen... 2 puffs every 4 hrs as needed    Afrin Nasal Spray 0.05 % Soln (Oxymetazoline hcl) .Marland Kitchen... 2 puffs every 12 hrs as as needed x 5 days    Alavert 10 Mg Tabs (Loratadine) ..... Once daily as needed  I emphasized that nasal steroids have no immediate benefit in terms of improving symptoms.  To help them reached the target tissue, the patient should use Afrin two puffs every 12 hours applied one min before using the nasal steroids.  Afrin should be stopped after no more than 5 days.  If the symptoms worsen, Afrin can be restarted after 5 days off of therapy to prevent rebound congestion from overuse of Afrin.  I also emphasized that in no way are nasal steroids a concern in terms of "addiction".     Each maintenance medication was reviewed in detail including most importantly the difference between maintenance and as needed and under what circumstances the prns are to be used. This was done in the context of a medication calendar review which provided the patient with a user-friendly unambiguous mechanism for medication administration and reconciliation and provides an  action plan for all active problems. It is critical that this be shown to every doctor  for modification during the office visit if necessary so the patient can use it as a working document.   Other Orders: EKG w/ Interpretation (93000) T-Vitamin D (25-Hydroxy) (04540-98119) T-2 View CXR (71020TC) Est. Patient 65& > (14782) TLB-Lipid Panel (80061-LIPID) TLB-BMP (Basic Metabolic Panel-BMET) (80048-METABOL) TLB-CBC Platelet - w/Differential (85025-CBCD) TLB-Hepatic/Liver Function Pnl (80076-HEPATIC) TLB-TSH (Thyroid Stimulating Hormone) (84443-TSH) TLB-Udip ONLY (81003-UDIP)  Patient Instructions: 1)  Call [phone] for your results w/in next 3 days - if there's something important  I feel you need to know,  I'll be in touch with you directly.  2)  Return to office in 3 months, sooner if needed

## 2011-02-03 ENCOUNTER — Ambulatory Visit (INDEPENDENT_AMBULATORY_CARE_PROVIDER_SITE_OTHER): Payer: Medicare Other | Admitting: Internal Medicine

## 2011-02-03 ENCOUNTER — Encounter: Payer: Self-pay | Admitting: Internal Medicine

## 2011-02-03 DIAGNOSIS — J45909 Unspecified asthma, uncomplicated: Secondary | ICD-10-CM

## 2011-02-03 DIAGNOSIS — I10 Essential (primary) hypertension: Secondary | ICD-10-CM

## 2011-02-03 DIAGNOSIS — M899 Disorder of bone, unspecified: Secondary | ICD-10-CM

## 2011-02-03 DIAGNOSIS — E785 Hyperlipidemia, unspecified: Secondary | ICD-10-CM

## 2011-02-10 NOTE — Assessment & Plan Note (Signed)
Summary: Pulmonary/ ext acute ov f/u multiple issues, aeab   Primary Provider/Referring Provider:  Sherene Sires  CC:  3 month follow up.  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  completed xolair 11/07     December 24, 2009 ov  nasal blockage on right transiently better with afrin with recurrent  yellow drainage, no problem with sob. rec prednisone x 6 days and 20 days cephalexin     January 27, 2010 ov all better, no nasal co's or sob or cough.  May 04, 2010 ov  c/o nasal congestion off and on since last seen- afrin and nasal saline help.   no change  August 05, 2010 3 month follow up. Pt c/o right nostril congestion with occ green drainage not getting better. Breathing is the same with occ wheezing with relief from inhaler.  Green drainage always from right side where had surgery on teeth 6 months ago and problems since then. not using much afrin and never saba. rx x 20 days, resolved no  need for sinus ct   November 05, 2010 ov  cpx mild uri but no purulent sputum. rec no change , follow med calendar  February 03, 2011 ov cc   wheezing x 4 days so increase proaire to q4h x not  using while sleeping, no purulent sputum. Pt denies any significant sore throat, dysphagia, itching, sneezing,  nasal congestion or excess secretions,  fever, chills, sweats, unintended wt loss, pleuritic or exertional cp, hempoptysis,   orthopnea pnd or leg swelling.  Pt also denies any obvious fluctuation in symptoms with weather or environmental change or other alleviating or aggravating factors.        Current Medications (verified): 1)  Norvasc 10 Mg  Tabs (Amlodipine Besylate) .... One Each Am 2)  Spironolactone-Hctz 25-25 Mg  Tabs (Spironolactone-Hctz) .... Take 2 Tablets By Mouth Once Daily 3)  Clonidine Hcl 0.1 Mg Tabs (Clonidine Hcl) .Marland Kitchen.. 1 Tablet By Mouth Twice A  Day 4)  Symbicort 160-4.5 Mcg/act  Aero (Budesonide-Formoterol Fumarate) .... 2 Puffs First Thing  in Am and 2 Puffs Again in Pm About 12 Hours Later 5)  Lipitor 20 Mg  Tabs (Atorvastatin Calcium) .... Take 1/2 Tab By Mouth At Bedtime 6)  Omeprazole 20 Mg  Tbec (Omeprazole) .... Take 1 Tablet By Mouth Once A Day 7)  Calcium 1200-1000 Mg-Unit Chew (Calcium Carbonate-Vit D-Min) .... Once Daily 8)  Nasonex 50 Mcg/act  Susp (Mometasone Furoate) .Marland Kitchen.. 1 Puff Each Nostril Once Daily 9)  Multivitamins   Tabs (Multiple Vitamin) .... Once Daily 10)  Pepcid 20 Mg Tabs (Famotidine) .Marland Kitchen.. 1 At Bedtime 11)  Singulair 10 Mg  Tabs (Montelukast Sodium) .... Take 1 Tab By Mouth At Bedtime 12)  Qvar 80 Mcg/act Aers (Beclomethasone Dipropionate) .... 2 Puffs Twice Daily 13)  Vitamin D3 1000 Unit Caps (Cholecalciferol) .... Take 1 Capsule By Mouth Once A Day 14)  Dramamine 50 Mg Tabs (Dimenhydrinate) .Marland Kitchen.. 1 Every 8 Hours As Needed 15)  Nasal Saline 0.65 %  Soln (Saline) .... 2 Puffs Every 4 Hrs As Needed 16)  Afrin Nasal Spray 0.05 %  Soln (Oxymetazoline Hcl) .... 2 Puffs Every 12 Hrs As As Needed X 5 Days 17)  Alavert 10 Mg  Tabs (Loratadine) .... Once Daily As Needed 18)  Proair Hfa 108 (90 Base) Mcg/act Aers (  Albuterol Sulfate) .Marland Kitchen.. 1-2 Puffs Every 4-6 Hours As Needed 19)  Mucinex Dm 30-600 Mg  Tb12 (Dextromethorphan-Guaifenesin) .Marland Kitchen.. 1-2 Tablets Every 12 Hrs As Needed 20)  Tramadol Hcl 50 Mg  Tabs (Tramadol Hcl) .Marland Kitchen.. 1 By Mouth Every 4 Hours If Coughing While On Mucinex Dm 21)  Clotrimazole-Betamethasone 1-0.05 % Crea (Clotrimazole-Betamethasone) .... Use As Directed  Allergies (verified): 1)  ! Benadryl 2)  Aspirin (Aspirin) 3)  Codeine Phosphate (Codeine Phosphate) 4)  Penicillin G Potassium (Penicillin G Potassium)  Past History:  Past Medical History: BENIGN POSITIONAL VERTIGO (ICD-386.11) OSTEOPENIA (ICD-733.90)   -  DEXA  08/22/07  AP spine + 1.1,  left femur -1.3, right femur -.8   -  DEXA  10/06/09               + 1.6  left femur -1.6, right femur -.5  HYPERTENSION (ICD-401.9) HYPERLIPIDEMIA (ICD-272.4)    - Target < 130 ldl pos fm hx, hbp OBESITY    - Target wt  =   178  for BMI < 30  ASTHMA (ICD-493.90) ([name] not active)    -Xolair s  8/05 >  11/07    -Mastered HFA December 30, 2008  ALLERGIC RHINITIS (ICD-477.9) HEALTH MAINTENANCE...........................................................................Marland KitchenWert    - Td 10/06    - Pneumovax 10/04 and October 27, 2009     - Colonscopy 10/28/2004    - CPX  November 05, 2010     - GYN per [name]       Vital Signs:  Patient profile:   70 year old female Height:      59 inches Weight:      141.50 pounds BMI:     28.68 O2 Sat:      96 % on Room air Temp:     97.8 degrees F oral Pulse rate:   68 / minute BP sitting:   134 / 76  (right arm) Cuff size:   regular  Vitals Entered By: Gweneth Dimitri RN (February 03, 2011 9:27 AM)  O2 Flow:  Room air CC: 3 month follow up.  wheezing x 4 days. Comments Medications reviewed with patient Daytime contact number verified with patient. Gweneth Dimitri RN  February 03, 2011 9:28 AM    Physical Exam  Additional Exam:  wt GEN: A/Ox3; pleasant , NAD wt  150 January 27, 2010  > 139 November 05, 2010 > 141 February 03, 2011  HEENT:  [place]/AT, , [employer] training [employer], TMs-wnl, NOSE-clear, THROAT-clear upper dentures NECK:  Supple w/ fair ROM; no JVD; normal carotid impulses w/o bruits; no thyromegaly or nodules palpated; no lymphadenopathy. RESP mod reduction in air movement bilaterally with end exp wheeze, mod increase Texp CARD:  RRR, no m/r/g   GI:   Soft & nt; nml bowel sounds; no organomegaly or masses detected. Musco: Warm bil,  no calf tenderness edema, clubbing, pulses intact Skin No rash       Impression & Recommendations:  Problem # 1:  ASTHMA (ICD-493.90) Mild flare despite excellent adherence - add short course prednisone   Each maintenance medication was reviewed  in detail including most importantly the difference between maintenance and as needed and under what circumstances the prns are to be used. This was done in the context of a medication calendar review which provided the patient with a user-friendly unambiguous mechanism for medication administration and reconciliation and provides an action plan for all active problems. It is critical that this be shown to every doctor  for modification  during the office visit if necessary so the patient can use it as a working document.   Problem # 2:  OSTEOPENIA (ICD-733.90) Vit d 58 so ok to elminate the extra Vit d 1000 at this point  Problem # 3:  HYPERTENSION (ICD-401.9)  Her updated medication list for this problem includes:    Norvasc 10 Mg Tabs (Amlodipine besylate) ..... One each am    Spironolactone-hctz 25-25 Mg Tabs (Spironolactone-hctz) .Marland Kitchen... Take 2 tablets by mouth once daily    Clonidine Hcl 0.1 Mg Tabs (Clonidine hcl) .Marland Kitchen... 1 tablet by mouth twice a day     Problem # 4:  HYPERLIPIDEMIA (ICD-272.4)  Her updated medication list for this problem includes:    Lipitor 20 Mg Tabs (Atorvastatin calcium) .Marland Kitchen... Take 1/2 tab by mouth at bedtime   Target LDL < 130 reached   Labs Reviewed: SGOT: 23 (11/05/2010)   SGPT: 16 (11/05/2010)   HDL:47.80 (11/05/2010), 42.90 (10/27/2009)  LDL:121 (11/05/2010), 114 (10/27/2009)  Chol:185 (11/05/2010), 185 (10/27/2009)  Trig:79.0 (11/05/2010), 143.0 (10/27/2009)  Medications Added to Medication List This Visit: 1)  Spironolactone-hctz 25-25 Mg Tabs (Spironolactone-hctz) .... Take 2 tablets by mouth once daily 2)  Lipitor 20 Mg Tabs (Atorvastatin calcium) .... Take 1/2 tab by mouth at bedtime 3)  Omeprazole 20 Mg Tbec (Omeprazole) .... Take 1 tablet by mouth once a day 4)  Prednisone 10 Mg Tabs (Prednisone) .... 4 each am x 2days, 2x2days, 1x2days and stop  Other Orders: Prescription Created Electronically [id number]) Est. Patient Level IV (96295)    Patient  Instructions: 1)  Stop Vit D3 1000  but continue the multivitamin with D and the Calcium with D as per calendar 2)  Prednisone 10 4 each am x 2days, 2x2days, 1x2days and stop  3)  See calendar for specific medication instructions and bring it back for each and every office visit for every healthcare provider you see.  Without it,  you may not receive the best quality medical care that we feel you deserve.  4)  See Tammy NP w/in 3 months with all your medications, even over the counter meds, separated in two separate bags, the ones you take no matter what vs the ones you stop once you feel better and take only as needed.  She will generate for you a new user friendly medication calendar that will keep  [place] all on the same page re: your medication use.  Prescriptions: PREDNISONE 10 MG  TABS (PREDNISONE) 4 each am x 2days, 2x2days, 1x2days and stop  #14 x 0   Entered and Authorized by:   Nyoka Cowden MD   Signed by:   Nyoka Cowden MD on 02/03/2011   Method used:   Electronically to        [facility] (retail)       [address]       [place], [place]  [id number]       Ph: [phone]       Fax: [phone]   RxID:   [id number] OMEPRAZOLE 20 MG  TBEC (OMEPRAZOLE) Take 1 tablet by mouth once a day  #34 x 11   Entered and Authorized by:   Nyoka Cowden MD   Signed by:   Nyoka Cowden MD on 02/03/2011   Method used:   Electronically to        [facility] (retail)       [address]  [place], [place]  [address]       Ph: [phone]       Fax: [phone]   RxID:   [id number] LIPITOR 20 MG  TABS (ATORVASTATIN CALCIUM) Take 1/2 tab by mouth at bedtime  #15 x 11   Entered and Authorized by:   Nyoka Cowden MD   Signed by:   Nyoka Cowden MD on 02/03/2011   Method used:   Electronically to        [facility] (retail)       [address]       [place], [place]  [address]       Ph:  [phone]       Fax: [phone]   RxID:   [id number] SPIRONOLACTONE-HCTZ 25-25 MG  TABS (SPIRONOLACTONE-HCTZ) Take 2 tablets by mouth once daily  #68 x 11   Entered and Authorized by:   Nyoka Cowden MD   Signed by:   Nyoka Cowden MD on 02/03/2011   Method used:   Electronically to        [facility] (retail)       [address]       [place], [place]  [id number]       Ph: [phone]       Fax: [phone]   RxID:   [id number] NORVASC 10 MG  TABS (AMLODIPINE BESYLATE) one each am  #90 x 4   Entered and Authorized by:   Nyoka Cowden MD   Signed by:   Nyoka Cowden MD on 02/03/2011   Method used:   Electronically to        [facility] (retail)       [address]       [place], [place]  [address]       Ph: [phone]       Fax: [phone]   RxID:   [id number]

## 2011-05-04 ENCOUNTER — Encounter: Payer: Self-pay | Admitting: Adult Health

## 2011-05-04 NOTE — Assessment & Plan Note (Signed)
[facility]                             PULMONARY OFFICE NOTE   NAME:BAILEYDajia, Gunnels                    MRN:          [id number]  DATE:06/16/2007                            DOB:          1941/01/02    HISTORY OF PRESENT ILLNESS:  The patient is a 70 year old African-  American female, patient of Dr. Thurston Hole who has a known history of asthma  and hypertension and hyperlipidemia presents today with a 2 week history  of intermittent episodes of lightheadedness.  The patient complains she  feels somewhat dizzy when she turns her head, rolls over in bed or  changes positions.  She has had some mild nausea at times.  She denies  any vomiting, headache, fever, chest pain, palpitations, presyncopal or  syncopal episodes, abdominal pain, urination symptoms or bloody stools.   PAST MEDICAL HISTORY:  Is reviewed.   CURRENT MEDICATIONS:  Reviewed.   PHYSICAL EXAMINATION:  The patient is a pleasant female in no acute  distress.  She is afebrile with stable vital signs. Blood pressure is 134/86.  HEENT:  PERRLA.  EOMI without nystagmus.  Posterior pharynx is clear.  TMs are normal. Nasal mucosa is pink and moist.  NECK:  Supple without adenopathy.  LUNGS:  Sounds are clear.  CARDIAC:  Regular rate and rhythm without murmur, rub or gallop.  ABDOMEN:  Soft and nontender.  EXTREMITIES:  Warm without any edema.  NEURO:  Alert and oriented x3. Cranial nerves II through XII are intact.  Equal strength in upper and lower extremities.  Moves all extremities  well.  Normal hand grip. Negative Romberg.  Head maneuvers with  reproducible symptoms; however, negative  for nystagmus.   IMPRESSION AND PLAN:  Benign positional vertigo.  Patient is to begin  meclizine 25 mg up to 3 times a day as needed.  Increase fluid intake.  Labs including CBC, CMET and TSH are all pending.  Will follow up  accordingly.  The patient will return back to the office as scheduled  with  Dr. Sherene Sires or sooner if needed.  The patient will contact our office for  sooner follow up if symptoms do not improve or worsen.     Rubye Oaks, NP  Electronically Signed      Charlaine Dalton. Sherene Sires, MD, [facility]  Electronically Signed   TP/MedQ  DD: 06/16/2007  DT: 06/16/2007  Job #: [id number]

## 2011-05-04 NOTE — Assessment & Plan Note (Signed)
[facility]                             PULMONARY OFFICE NOTE   Campbell, Cassie Campbell                    MRN:          [id number]  DATE:10/04/2007                            DOB:          1941-03-28    PRIMARY SERVICE COMPREHENSIVE HEALTH CARE EVALUATION:   HISTORY:  A 70 year old black female followed here for hypertension,  moderate obesity, asthma with evidence of ABPA, status post Xolair  therapy from June 2005 through November 2007 and doing fine on a regimen  consisting only of Advair 100/50 mcg b.i.d. and Rhinocort one puff at  night.  She has had problems with intermittent positional vertigo, which  comes and goes but is well-controlled on p.r.n. meclizine, which she  rarely finds the need for now.  She denies any overt sinus, other sinus  or ear complaints.   She also denies any exertional chest pain, orthopnea, PND or leg  swelling.   Presently she is able to exercise up to 30 minutes three times weekly,  mostly walking on a flat surface and rarely short of breath and has no  exertional chest pain, orthopnea, PND, TIA or claudication symptoms.   PAST MEDICAL HISTORY:  1. Hypertension.  2. Benign positional vertigo.  3. Postmenopausal with mild osteopenia.  Most recent bone densitometry      done on September 2.   ALLERGIES:  None known.   MEDICATIONS:  Taken in detail on the work sheet and correct in the  column dated October 04, 2007, and corresponds nicely to the calendar  she carries with her.   SOCIAL HISTORY:  She has never smoked.  She denies alcohol use.  She  works as a Naval architect for a Nurse, adult home.  She has little  patient contact.  She lost her daughter 2 years ago due to breast cancer  but seems to have adjusted to it well.   FAMILY HISTORY:  Positive for heart disease in her father, who died at  age 73.  Mother had stroke and hypertension as well as diabetes.  Her  only sister had diabetes and cirrhosis  related to alcoholism.  There is  no family member with cancer of any kind except in her daughter, who  died of breast cancer.   REVIEW OF SYSTEMS:  Taken in detail on the worksheet.  Significant for  the problems as outlined above.   PHYSICAL EXAMINATION:  This is a robust, pleasant, healthy-appearing,  ambulatory black female in no acute distress.  Blood pressure of 120/80.  Her height is 4 feet 11 inches, which is no  change from baseline.  HEENT:  Unremarkable.  Pharynx is clear.  A limited ocular exam was  normal.  Ear canals clear bilaterally.  NECK:  Supple without cervical adenopathy or tenderness.  Trachea is  midline.  No thyromegaly.  Neck was supple without cervical adenopathy.  Carotid upstrokes were brisk with no bruits.  CHEST:  Completely clear bilaterally to auscultation and percussion.  BREASTS:  Without masses, nipple or skin changes.  Axillary nodes were  negative.  Regular rhythm without murmur, gallop, or rub.  No displacement of PMI.  ABDOMEN:  Soft and benign with no palpable organomegaly, masses or  tenderness.  Femoral pulses were present bilaterally with no bruits.  GENITOURINARY, RECTAL:  Per Aram Beecham P. Romine, M.D.  EXTREMITIES:  Warm without calf tenderness, cyanosis, clubbing or edema.  Pedal pulses were present bilaterally.  NEUROLOGIC:  No focal deficit or pathologic reflexes.  SKIN:  Warm and dry with no lesions.   Chest x-ray was normal, as was EKG.   LAB DATA:  Significant for a normal TSH.  Normal LFTs.  BMET was normal  with a fasting blood sugar of 103.  CBC was normal.  Urinalysis was  unremarkable.   IMPRESSION:  1. Hypertension is adequately controlled on her present regimen.  2. Asthma is under exceptionally good control on her present regimen      with no significant symptoms either at night or with exercise and      no need for rescue therapy.  3. Moderate obesity with target weight of less than 133.  She has not      made much  progress in this regard despite normal TSH and I have      reviewed with her again calorie balance issues today.  4. Health maintenance.  She was due influenza today, having given      tetanus in 2006 and Pneumovax in 2004 and therefore was given a flu      shot.  5. Hyperlipidemia.  Lipid profile is pending.  LFTs are normal on      Lipitor at 20 mg daily with a target LDL of less than 130 based on      hypertension and positive family history.  6. Postmenopausal with osteopenia.  Vitamin D levels are also pending.      At this point I think just using adequate amounts of calcium,      making sure her vitamin D is normal, and getting plenty of exercise      is all she needs.  Her bone density actually shows slight      improvement this year versus two years ago, which is certainly      encouraging.  7. Gynecologic health maintenance per Dr. Meredeth Ide.     Charlaine Dalton. Sherene Sires, MD, [facility]  Electronically Signed    MBW/MedQ  DD: 10/04/2007  DT: 10/05/2007  Job #: [id number]   cc:   Edwena Felty. Romine, M.D.

## 2011-05-04 NOTE — Assessment & Plan Note (Signed)
[facility]                             PULMONARY OFFICE NOTE   NAME:Cassie Campbell, Cassie Campbell                    MRN:          [id number]  DATE:05/08/2007                            DOB:          1941-02-15    HISTORY OF PRESENT ILLNESS:  The patient is a 70 year old African-  American female patient of Dr. Thurston Hole with a known history of asthma and  hypertension who presents for a two week followup to review medications.  Last is that patient's blood pressure had been slightly elevated, and  patient's clonidine was increased up to 1 tablet twice daily.  The  patient returns today reporting that she is tolerating her medication  change without known difficulty.  Her medications are correct with her  medication list.  Patient denies any chest pain, shortness of breath,  orthopnea, PND, or leg swelling.   PAST MEDICAL HISTORY:  Reviewed.   CURRENT MEDICATIONS:  Reviewed.   PHYSICAL EXAMINATION:  GENERAL:  Patient is a pleasant female in no  acute distress.  VITAL SIGNS:  She is afebrile with stable vital signs.  Blood pressure  was 152/90 today.  HEENT:  Unremarkable.  NECK:  Supple without cervical adenopathy.  No JVD.  LUNGS:  Lung sounds are clear.  CARDIAC:  Regular rate and rhythm.  ABDOMEN:  Soft and nontender.  EXTREMITIES:  Warm without any edema.   IMPRESSION/PLAN:  1. Hypertension:  Slightly improved with clonidine change; however,      will continue to monitor closely.  Patient has to follow back up      here in two months with Dr. Sherene Sires or sooner if needed.  2. Asthma:  Currently well compensated.  Patient will continue on the      present regimen.  3. Complex medication regimen:  Patient's medications were reviewed in      detail.  Patient's education was provided.  A computerized      medication calendar was completed for this patient and reviewed in      detail.     Rubye Oaks, NP  Electronically Signed      Cassie Campbell. Sherene Sires,  MD, [facility]  Electronically Signed   TP/MedQ  DD: 05/09/2007  DT: 05/09/2007  Job #: [id number]

## 2011-05-05 ENCOUNTER — Ambulatory Visit (INDEPENDENT_AMBULATORY_CARE_PROVIDER_SITE_OTHER): Payer: Medicare Other | Admitting: Adult Health

## 2011-05-05 ENCOUNTER — Encounter: Payer: Self-pay | Admitting: Adult Health

## 2011-05-05 ENCOUNTER — Other Ambulatory Visit (INDEPENDENT_AMBULATORY_CARE_PROVIDER_SITE_OTHER): Payer: Medicare Other

## 2011-05-05 DIAGNOSIS — R930 Abnormal findings on diagnostic imaging of skull and head, not elsewhere classified: Secondary | ICD-10-CM

## 2011-05-05 DIAGNOSIS — I10 Essential (primary) hypertension: Secondary | ICD-10-CM

## 2011-05-05 DIAGNOSIS — H8110 Benign paroxysmal vertigo, unspecified ear: Secondary | ICD-10-CM

## 2011-05-05 DIAGNOSIS — E785 Hyperlipidemia, unspecified: Secondary | ICD-10-CM

## 2011-05-05 DIAGNOSIS — J309 Allergic rhinitis, unspecified: Secondary | ICD-10-CM

## 2011-05-05 DIAGNOSIS — M899 Disorder of bone, unspecified: Secondary | ICD-10-CM

## 2011-05-05 DIAGNOSIS — J45909 Unspecified asthma, uncomplicated: Secondary | ICD-10-CM

## 2011-05-05 LAB — HEPATIC FUNCTION PANEL
Bilirubin, Direct: 0.1 mg/dL (ref 0.0–0.3)
Total Bilirubin: 0.8 mg/dL (ref 0.3–1.2)

## 2011-05-05 LAB — BASIC METABOLIC PANEL
Calcium: 9.5 mg/dL (ref 8.4–10.5)
Creatinine, Ser: 0.8 mg/dL (ref 0.4–1.2)

## 2011-05-05 NOTE — Patient Instructions (Signed)
Continue on same meds .  Follow med calendar closely and bring to each visit.  I will call with labs  follow up Dr. Sherene Sires  In 3 months and As needed

## 2011-05-07 ENCOUNTER — Encounter: Payer: Self-pay | Admitting: Adult Health

## 2011-05-07 NOTE — Op Note (Signed)
NAMEJAKALA, Cassie Campbell NO.:  [id number]   MEDICAL RECORD NO.:  [id number]          PATIENT TYPE:  AMB   LOCATION:  DSC                          FACILITY:  [facility]   PHYSICIAN:  Ezequiel Kayser. Ajlouny, D.P.M.DATE OF BIRTH:  21-Feb-1941   DATE OF PROCEDURE:  11/01/2006  DATE OF DISCHARGE:  11/01/2006                                 OPERATIVE REPORT   SURGEON:  Ezequiel Kayser. Ajlouny, D.P.M.   ASSISTANT:  None.   PREOPERATIVE DIAGNOSIS:  Hallux varus deformity, right foot.   POSTOPERATIVE DIAGNOSIS:  Hallux varus deformity, right foot.   PROCEDURE:  First metatarsophalangeal joint fusion, internal fixation, right  foot.   ANESTHESIA:  MAC with local.   COMPLICATIONS:  None.   HEMOSTASIS:  Pneumatic ankle tourniquet inflated to 250 mmHg.   DESCRIPTION OF PROCEDURE:  Attention was directed to the first ray, where a  dorsolinear incision was made.  The incision was deepened via sharp and  blunt modalities, taking care to clamp and cauterize all bleeding vessels,  and insuring retraction of all neurovascular structures encountered.  The  deep and superficial fascia were separated medially and laterally for the  length of the incision.  There was extensive scar tissue secondary to her  previous foot surgery, which did require more time and more dissection to  perform the entire procedure.  A linear incision was made over the first  metatarsophalangeal joint the length of the incision, and the periosteum and  the capsule were freed.  Extensive soft tissue was carried out, freeing the  head of the first metatarsal and the base of the proximal phalanx.  The  flexor tendon was repeatedly checked and found to be intact and functional.  Once extensive dissection allowed for exposure of the first metatarsal and  the base of the proximal phalanx, the [facility] first MTP joint fusion  plate system was used.  There are multiple reamers that were used, first at  the head of the  first metatarsal until all the cartilage was removed.  Next,  using the reamers at the base of the proximal phalanx, the cartilage was  removed.  It was noted that there was irregularity to the shape of the first  metatarsal after her previous surgery, and hypertrophic bone and  irregularities were addressed with a power saw and a power rasp to allow for  fixation using the fusion plate.  Next, the first MTP joint fusion plate was  applied, and positioning of the hallux was addressed, allowing for 10  degrees of dorsiflexion and abduction.  Once positioning was found to be  adequate, the plate was temporarily stabilized using 2 K wires.  Next, the  compression screw was used to fixate the first MTP joint from distal medial  to proximal lateral.  The plate was applied and the appropriate screws were  used to fixate the plate.  Fixation was confirmed with intraoperative  radiographs.  Once fixation was completed, the position of the hallux was  checked and found to be adequate.  All temporary K wires were removed.  The  surgical wound was irrigated with  copious amounts of sterile saline.  Because of the extent of the dissection, some scar tissue and irregularities  in the bone, this took more than the usual time to perform the procedure.  The tourniquet was deflated before 2 hours, and the remainder of the case  was performed wet.  Once the fixation was completed, the foot was irrigated  with copious amounts of saline antibiotic solution.  The periosteum and  capsule were reapproximated with 3-0 Vicryl.  Deep closure was accomplished  with 4-0 Vicryl, and skin closure accomplished with 4-0 Monocryl in a  running, subcuticular fashion.  Postoperatively, 0.5% Marcaine plain and  dexamethasone phosphate were infiltrated around the surgical site.  The foot  was dressed with Steri-Strips, Adaptic, 4 x 4's, Kling and Coban.  The  patient was sent to the recovery room with vital signs stable and  capillary  refill time at preoperative levels.  There were no intraoperative  complications.  The patient is to remain completely nonweightbearing.  She  can walk over to one side, which is the left.  No guarantees given.  All  questions answered.           ______________________________  Ezequiel Kayser. Harriet Pho, D.P.M.     MJA/MEDQ  D:  11/07/2006  T:  11/07/2006  Job:  [id number]

## 2011-05-07 NOTE — Assessment & Plan Note (Signed)
[facility]                             PULMONARY OFFICE NOTE   NAME:Cassie Campbell, Cassie Campbell                    MRN:          [id number]  DATE:03/02/2007                            DOB:          01-01-1941    HISTORY OF PRESENT ILLNESS:  The patient is a 70 year old African-  American female patient of Dr. Sherene Sires, who has a known history of chronic  asthma, and presents for a one-week history of nasal congestion,  productive cough of thick, clear, yellow sputum and severe coughing.  Patient denies any hemoptysis, chest pain, orthopnea, PND or leg  swelling.   PAST MEDICAL HISTORY:  Reviewed.   CURRENT MEDICATIONS:  Reviewed.   PHYSICAL EXAM:  Patient is a pleasant female, in no acute distress.  She  is afebrile with stable vital signs, O2 saturation is 94% on room air.  HEENT:  Unremarkable.  NECK:  Supple without adenopathy.  No JVD.  LUNG SOUNDS:  Reveal coarse breath sounds without any wheezing.  CARDIAC:  Regular rate.  ABDOMEN:  Soft and nontender.  EXTREMITIES:  Warm without any edema.   IMPRESSION AND PLAN:  Acute upper respiratory infection with a mild  asthmatic flare.  The patient is to get a Z-Pak, add in Mucinex DM twice  daily and may use Endal HD 8 ounces one teaspoon every 4-6 hours as  needed for cough.  Patient is to return back with Dr. Sherene Sires as scheduled  or sooner, if needed.      Rubye Oaks, NP  Electronically Signed      Charlaine Dalton. Sherene Sires, MD, [facility]  Electronically Signed   TP/MedQ  DD: 03/02/2007  DT: 03/03/2007  Job #: [id number]

## 2011-05-07 NOTE — Discharge Summary (Signed)
NAME:  Cassie Campbell, Cassie Campbell                 ACCOUNT NO.:  [id number]   MEDICAL RECORD NO.:  [id number]                   PATIENT TYPE:  INP   LOCATION:  0354                                 [facility]ACILITY:  [facility]   PHYSICIAN:  Charlaine Dalton. Sherene Sires, M.D. [facility]           DATE OF BIRTH:  20-Apr-1941   DATE OF ADMISSION:  12/16/2003  DATE OF DISCHARGE:  12/20/2003                                 DISCHARGE SUMMARY   FINAL DIAGNOSES:  1. 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     a. Subsegmental atelectasis of the right middle lobe documented this        admission and chronic in nature.     b. Chronic sinusitis also documented this admission.  2. Hypertension.  3. Hyperlipidemia.   HISTORY:  Please see dictated H&P.  This patient is a never smoker admitted  with refractory asthma that had failed to respond to outpatient treatment  including systemic steroids.  She had been supplied a nebulizer [facility] and even the nebulizer was not effective at maintaining her wheeze free  and she came in short of breath at rest.  She was treated aggressively with  IV steroids, round-the-clock bronchodilators, and treated for  rhinitis/sinusitis documented by CT scan with Avelox for which she will  receive a total of 10 days.   At the time of discharge she is wheeze free, ambulating without any dyspnea,  also not requiring any rescue beta-2 therapy on the following regimen:  Norvasc 10 mg one daily, spironolactone/hydrochlorothiazide 25/25 two once  daily, Pravachol 20 mg one at bedtime, Rhinocort two puffs b.i.d., Advair  250/50 one b.i.d., Nexium or omeprazole taken b.i.d. before meals (the  Nexium dose is 40 mg, the omeprazole dose is 20 mg), clonidine 0.1 mg  b.i.d., Singulair 10 mg every evening (empirically), Avelox 400 mg daily to  complete an additional 7 days which will be a total of about 10 days of  therapy, prednisone 40 mg daily to be tapered  off over a 12-day course.  Followup will be in 1 week in the office.  She can use for cough guaifenesin  tablets 600 two every 12 hours, for wheeze she can use Proventil inhaler two  puffs every 4 hours.  I have told her she can use the nebulizer if  absolutely necessary but if she notices an increased need for nebulizer  therapy as the prednisone is decreased she needs to contact my office for  further instructions.                                               Charlaine Dalton. Sherene Sires, M.D. [facility]    MBW/MEDQ  D:  12/20/2003  T:  12/20/2003  Job:  [id number]

## 2011-05-07 NOTE — H&P (Signed)
NAME:  Cassie Campbell, Cassie Campbell                 ACCOUNT NO.:  [id number]   MEDICAL RECORD NO.:  [id number]                   PATIENT TYPE:  INP   LOCATION:  0354                                 FACILITY[facility]   PHYSICIAN:  Charlaine Dalton. Sherene Sires, M.D. [facility]           DATE OF BIRTH:  Mar 24, 1941   DATE OF ADMISSION:  12/16/2003  DATE OF DISCHARGE:                                HISTORY & PHYSICAL   CHIEF COMPLAINT:  Dyspnea.   HISTORY:  Ms. Mooneyham is a 70 year old black female with longstanding asthma  previously controlled on a combination of topical steroids in the form of  Advair, Rhinocort, and rarely needing albuterol. I saw her in the office  today with an asthma exacerbation that, according to our records, has  actually been going on since October 2004, and has been associated with  refractory symptoms of nasal congestion and persistently purulent sputum,  associated with purulent nasal discharge. She did not have any fever or  chest pain. She had been noted to have right middle lobe atelectasis on  chest x-ray on several office visits that had failed to clear with  antibiotic therapy. When she came to see me today I realized that she had  actually been placed on nebulizer therapy by an outpatient clinic and even  this was not controlling her asthma for any more than an hour or two, and  had also been tried on prednisone without any benefit. I therefore  recommended hospitalization to gain control of her airway disease.  Presently, she denies any pleuritic pain, fever, chills, sweats, orthopnea,  PND, or leg swelling.   PAST MEDICAL HISTORY:  1. Hypertension.  2. Benign positional vertigo.  3. Postmenopausal with mild osteopenia by bone dyssymmetry in October 2003.  4. Status post cervical fusion in December 2001.   ALLERGIES:  CODEINE, PENICILLIN, and ASPIRIN.   PRESENT MEDICATIONS:  1. Norvasc 10 mg daily.  2. Spironolactone/hydrochlorothiazide 25/25 two daily.  3. Duoneb  q.i.d.  4. Pravachol 20 mg q.h.s.  5. Rhinocort q.h.s.  6. Advair 250/50 b.i.d.  7. Nexium 40 mg b.i.d.   SOCIAL HISTORY:  She has never smoked. She denies any alcohol use.  She  works as a Naval architect for a Nurse, adult home.   FAMILY HISTORY:  Positive for heart disease in her father at age 38. Her  mother had hypertension and diabetes. One sister has diabetes.  No cancer,  asthma, or atopic disease in her family to her knowledge.  No breast or  colon cancer.   REVIEW OF SYSTEMS:  Taken in detail and essentially negative, except as  noted above.   PHYSICAL EXAMINATION:  GENERAL: This is a slightly anxious, but quite  pleasant ambulatory black female who appears minimally cushingoid.  VITAL SIGNS: Afebrile with a blood pressure in the office of 120/80.  HEENT: Nasal turbinates moderately edematous. Oropharynx reveals minimal  cobblestoning with no evidence of active postnasal drainage.  NECK: Supple without cervical adenopathy  or tenderness. No thyromegaly.  LUNGS: Panexpiratory wheezing bilaterally with marked increased in  expiratory time that only improved somewhat after Xopenex treatment to the  point where she had a late expiratory wheeze but was still wheezing.  CARDIAC: There is a regular rate and rhythm without murmur, rub, gallop, or  increase in __________1-2.  ABDOMEN: Soft, benign with no palpable organomegaly, masses, or tenderness.  EXTREMITIES: Warm without calf tenderness, cyanosis, clubbing, or edema.  NEUROLOGIC: No focal deficits or pathologic reflexes.  SKIN: Warm and dry.   Initial lab data reveals white count 12,000 with eosinophils 1%. Chemistry  profile is unremarkable. BNP is less than 30.  Serum IgE level is 5286.   IMPRESSION AND PLAN:  Apparent refractory asthma in the setting of marked  elevation of IgE with right middle lobe atelectasis strongly suggests the  possibility of allergic bronchopulmonary aspergillosis developing in this  patient  with previously more easily controlled asthma.  Her workup will  include a sinus CT scan and repeat chest x-ray as well as high-dose IV  steroids around the clock and nebulizer treatment to gain control of  airways, inflammation, and bronchospasm.                                               Charlaine Dalton. Sherene Sires, M.D. [facility]    MBW/MEDQ  D:  12/16/2003  T:  12/16/2003  Job:  [id number]

## 2011-05-07 NOTE — Op Note (Signed)
[facility]  Patient:    Cassie Campbell, Cassie Campbell              MRN: [id number] Proc. Date: 11/23/00 Adm. Date:  [id number] Attending:  Lubertha South                           Operative Report  PREOPERATIVE DIAGNOSES:  Herniated nucleus pulposus, C3-4, C5-6 and C6-7. 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  POSTOPERATIVE DIAGNOSES:  Herniated nucleus pulposus, C3-4, C5-6 and C6-7. 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  PROCEDURE:  Anterior cervical diskectomy and fusion C3-4, C5-6 and C6-7 with dense cancellous allograft bone graft at each segment. Anterior cervical plates and screws from C5 to C7 40 mm with six 14 mm screws. Anterior cervical plating with a 30 mm plate at the G2-9 level four screws 14 mm in length each.  SURGEON:  Dr. Vira Browns.  ASSISTANT:  Dr. Ronnell Guadalajara.  ANESTHESIA:  GOT, Dr. Rica Mast.  ESTIMATED BLOOD LOSS:  150 cc.  DRAINS:  Jackson-Pratt left neck 7 mm. Foley to straight drain.  BRIEF HISTORY:  This patient is a 70 year old female whose had a several year history of neck pain radiation into her arms. She has been treated conservatively for apparent disk protrusions at C5-6 and C6-7 who did have evidence of cord compression 2 years ago; however, she had no clinical evidence of radiculopathy and indeed became asymptomatic after conservative treatment involving physical therapy and [place] exercises and traction. Over the last 6 months, she has noticed increasing weakness in the left upper extremity with increasing pain radiation into her left arm and hand. She has noticeable weakness in the left biceps and triceps and left shoulder abduction. MR study has demonstrated another disk protrusion, this at the C3-4 level which is quite large and central causing cord compression followed by gliosis changes. There is also a central disk protrusion  at C5-6 which also is causing cord compression and gliosis and at C6-7 a rather broad based disk protrusion, right side greater than with some extension in the left neuroforamen at C6-7 also associated with cord edema. The patient is brought to the operating room to undergo a 3 level anterior diskectomy with fusion with allograft, bone graft at each segment and plating.  DESCRIPTION OF PROCEDURE:  After adequate general anesthesia, the patient was given preoperative antibiotics and a standard prep with duraprep solution along the anterior neck and left anterior chest proximally. Draped in the usual manner, iodine impregnated Vi-drape. The patient had 5 pound cervical halter traction with transverse roll that was bent at the level of the upper shoulder blades. TED hose both lower extremities. All pressure points well padded.  The incision, a sternocleidomastoid incision, approximately 5 cm in length just medial to the border of the sternocleidomastoid through the skin and subcutaneous layers extending from approximately C7 to C3. Through the skin and subcutaneous layers using a 10 blade scalpel after infiltration with Marcaine 0.5% with 1:200,000 epinephrine. The incision was carried sharply down to the platysma layer. Bleeders controlled using electrocautery. The platysmal layer was then divided using electrocautery. Metzenbaums were then used to careful dissect the layers to the interval between the trachea esophagus and the carotid sheath laterally. Blunt dissection using a finger then used to develop the interval between the carotid sheath and the trachea and esophagus medially to the anterior aspect  of the cervical spine until tibiofascialis layer identified. This layer was then cauterized along the medial border of the longus colli muscle teased across the midline using the Administrator, [employer]. This provided excellent exposure to the anterior cervical spine. The C6-7 level was easily  identified, osteophytes present over its anterior level which had been seen on previous radiograph. The C6-7, C5-6 levels were carefully freed up and exposed anteriorly. Dissection then carried superiorly to the expected C3-4 level. Kidner dissectors were used the carefully free the prevertebral fascia over the anterior aspect of the cervical spine exposing these segments. Spinal needles then with the sheath cut to allow for only exposure of a centimeter of the so, the needle were then inserted to each of the segments and intraoperative radiograph obtained which demonstrated the needle placed at the C3-4 level as well as C5-6 and C6-7 anteriorly.  Under direct observation then each of the needles were removed and a small portion of the disk excised using a 15 blade scalpel at C3-4, C5-6 and C6-7 for continued identification of these levels throughout the remainder of the case. The median border of the longus colli muscle was then carefully freed up using electrocautery as well as Art therapist.  The Cloward self retaining retractor was then placed with the foot of the retractor beneath the border of the longus colli muscle on each side for obtaining self retraction and exposure of the C5-6 and C6-7 levels. Note that the articulating Casper type Cloward retractor was used. This provided excellent exposure.  An incision was made in the anterior aspect of the disk at the C6-7 level using a 15 blade scalpel anterior 1/2 to 2/3 of the disk was incised using pituitary rongeurs, Kerrisons as well as microcurettes. Anterior osteophytes were debrided using Kerrisons as well. The distraction system, casper, was then placed at this segment. A 14 mm screw post were inserted 1 into the vertebral body of C7 and then using a parallel guide, a drill hole made into the C6 level and then another 14 mm screw post placed. Distraction obtained across the C6-7 level then. The operating room microscope was  then draped and brought into the field. Under the microscope, the posterior 1/2 of the disk was excised using microcurettes as well as pituitary rongeurs back to the  posterior aspect of the disk space. The posterior annular material, annulus fibrosis, was then excised as well. The posterior longitudinal ligament disk ruptured material within the spinal canal and subligamentous was then excised using micropituitary rongeurs. Neuroforamen on the left side was decompressed using 1 or 2 mm Kerrisons. There was found to be disk material within the left neuroforamen and the neuroforamen itself was stenotic due to large hypertrophic spurs involving the ______. The end plates were then carefully decorticated and cartilagenous end plates excised using curettes and the high speed bur. The width of the intervertebral disk space measured 5 mm of depth almost 18 mm. A 14 mm width by a depth of 11 mm and a height of 5 mm graft was chosen. Sounding was performed using the sound provided for the dense cancellous bone graft. This was then impacted into place and appeared to be in excellent position and alignment. Could not be moved with a Kocher clamp following its insertion. Next, the screw post was removed at the C7 level and transferred to the C5 level and distraction obtained across this intervertebral disk space.  Bone wax applied to the screw post hole at C7 to obtain hemostasis. Distraction  obtained across this intervertebral disk space and the operating room microscope brought into the field. A 15 blade scalpel used to incise the anterior annular fibers at the C5-6 level with pituitaries as well as Kerrisons and curettes were used to debride the disk space of any vertebral disk material. Care was taken to ensure the disk was removed. In an excision fashion, anterior pressure against the disk was performed in order to prevent any significant continued herniation of disk material. This was quite a  large disk protrusion with cord compression. The disk material was removed entirely back to the posterior longitudinal ligament using the operating room microscope. The posterior longitudinal ligament was excised using a 1 mm Kerrison bilaterally with large disk protrusion removed centrally using small microtitanium nerve hooks as well as the micropituitary rongeurs. There was evidence of some bony proliferation within the disk material that was removed and this too was excised off the cord.  The cord showed a normal appearance following excision of this disk material centrally. It did appear to within its normal position against the posterior aspect of the disk space following the excision of large disk herniation. Curettes were then used carefully to decorticate the end plates of E4-5 both superiorly and inferiorly. The high speed bur was used to further smooth this area. This was then sounded and sounded to a 6 mm height to allow a 17 mm depth so that a graft measuring 11 mm in depth and 11 mm in width at a height of 6 mm was chosen. This was impacted into place without difficulty. Next, the expected height for plate fixation was determined using forceps across the 2 intervertebral disk levels. This measured approximately 24 mm. A 40 mm plate appeared to give the best fit across this intervertebral disk space and it was fitted across the anterior aspect of the disk space after the high speed bur was used to carefully remove any osteophytes over the intervertebral disk space anteriorly at C5-6 and C6-7. The plate then was easily fitted against the anterior aspect of the cervical spine following removal of the screw post and hemostasis in the screw post holes. The plate was then impacted against the anterior aspect of the cervical spine in its proper alignment and rotation. A single retaining screw was placed at the C7 level on the left side. The first screw placed was at the C6 level on  the left side using drill sleeve drilling to 14 mm and then placed the 14 mm self tapping screw on the left side. Next, the remaining screw holes were then each individually drilled at C5 both sides left and right and then self tapping screws placed to a 14 mm depth, then the right side at C6 drilled with a 14 mm drill and then a self tapping screw placed. Finally, at the C7 level first the right side was drilled and the appropriate size 14 mm screw placed and then the retaining screw on the left side removed. This was then drilled and the appropriate screw placed on this side of the C7 level. This provided excellent fixation in the intervertebral disk spaces, C5-6 and C6-7.  Intraoperative radiograph demonstrated the screws in place in good position and alignment; however, the lower portion of the plate and the screws could not be adequately visualized so that further C-arm examination was performed at the end of the case. Exposure was then obtained up more superiorly at the C3-4 levels using hand held Clowards. The longus colli muscle was  carefully freed up using key elevators, bipolar electrocautery used to control bleeders.  Incision was made into the anterior aspect of this disk after replacement of the Cloward self retaining retractor with the foot of the retractor beneath the medial border of the longus colli muscle at C3-4.  The operating room microscope brought onto the field after placement of screw posts 14 mm in depth above C3 and C4 for distraction in the disk space. Under the operating room microscope then the anterior aspect of the disk as well as the posterior aspect of the disk at this segment was excised. A 15 blade scalpel was used to incise the anterior annular fibers. Pituitaries as well as curettes and Kerrisons were used to further debride the anterior two-thirds of the disk all the way back to the posterior aspect of the annulus and this was then excised in its  entirety using 1 mm and 2 mm Kerrisons. A rent in the posterior longitudinal ligament was identified where a large fragment of disk material were excised from the rent area. The disk portion extending over the inferior aspect of the disk space over the superior aspect of ______ 4 was excised using the small titanium micro nerve hook as well as the micropituitary rongeurs. With this then, the central portion of the canal was  adequately decompressed. The patient was given 10 mg of Decadron intraoperatively. The intervertebral disk space was completely debrided of residual disk material present. The cartilagenous end plates were debrided down to bleeding cortical bone both superior and inferior aspect of disk space. The high end of the intervertebral disk space measured at 6 mm and sounded to a 6 mm height with sounds provided. A 6 mm x 11 mm graft was inserted and packed into place at this segment. Distraction was released, the screw post removed, bleeding screw post holes coated with bone wax obtaining hemostasis. The expected plate length was then measured and measured approximately 22 mm.  The plate approximately 30 mm in length was chosen for this intervertebral disk space and it was placed in line properly in the vertical plane. Impacted against the anterior aspect of the cervical spine.  With this held in place then drill holes were placed at the lower left side of C4. A 14 mm drill was used and then the appropriate size screw, self tapping, was placed on the left side. Then the right upper screw was placed at the C3 level. Drilling then placed the appropriate screw on the left side and then right side at C4. Note that all distraction had been removed as well as 5 pounds cervical halter traction had been removed at this point. Examination of the incision demonstrated very minimal bleeding evident with the medial border of the longus colli muscle and this was controlled using  bipolar electrocautery. Irrigation was performed throughout the incision.  A small bleeder which appeared to represent a thyroid vein in the vertex of the superior aspect of the incision was identified, ligation was performed of the vein using 2-0 silk ties both to the lateral aspect of the vein as well as medial aspect. With this then, excellent hemostasis was obtained. A stab incision was made over the anterior left side of the neck and a 7 mm Jackson-Pratt drain was placed to the depth of the incision and sewn into place using 4-0 nylon. This was then placed along the left side of the deep part of the neck and blades.  Intraoperative C-arm fluoro was then used to  ascertain position, alignment and placement of both the AP and lateral plane demonstrating excellent position and alignment, no evidence of retropulsion of any of the grafts and no evidence of screw penetration of the spinal canal.  The patient then underwent closure of the incision by approximating the platysma layer with interrupted 2-0 Vicryl suture, the deep subcu layers with interrupted 2-0 Vicryl sutures, more superficial layers with interrupted 3-0 Vicryl sutures. The skin closed with running subcu stitch of 4-0 Vicryl. Tinctured benzoin and Steri-Strips applied, 4 x 4s affixed to skin with hypofix tape and an Aspen collar applied. Note that at the end of the procedure, a small portion of a needle driver was noted now to be present and it is not known whether this had occurred during the case or prior to the case. Intraoperative radiographs were obtained at the end of the procedure which did not demonstrate any metallic foreign body present. The patient was then reactivated and returned to the recovery room in satisfactory condition. All instrument and sponge counts were correct. DD:  11/23/00 TD:  11/23/00 Job: [id number]

## 2011-05-07 NOTE — Progress Notes (Signed)
Subjective:    Patient ID: Cassie Campbell, female    DOB: Sep 02, 1941, 70 y.o.   MRN: [id number]  HPI 51 yobf never smoker with severe chronic asthma and previous history to suggest marked atopy with IgE level in excess of 10,000 cw abpa. completed xolair 11/07   December 24, 2009 ov nasal blockage on right transiently better with afrin with recurrent yellow drainage, no problem with sob. rec prednisone x 6 days and 20 days cephalexin   January 27, 2010 ov all better, no nasal co's or sob or cough.   May 04, 2010 ov c/o nasal congestion off and on since last seen- afrin and nasal saline help. no change   August 05, 2010 3 month follow up. Pt c/o right nostril congestion with occ green drainage not getting better. Breathing is the same with occ wheezing with relief from inhaler. Green drainage always from right side where had surgery on teeth 6 months ago and problems since then. not using much afrin and never saba. rx x 20 days, resolved no need for sinus ct   November 05, 2010 ov cpx mild uri but no purulent sputum. rec no change , follow med calendar   February 03, 2011 ov cc wheezing x 4 days so increase proaire to q4h x not using while sleeping, no purulent sputum.    05/05/11  Follow up and Med review Pt returns for follow up and med review. She is not fasting today. Says she is doing well We reviewed all her meds and organized her med calendar with pt education  NO ER /hosp visits since last ov. No increased SABA use.    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)  OSTEOPENIA (ICD-733.90)  - DEXA 08/22/07 AP spine + 1.1, left femur -1.3, right femur -.8  - DEXA 10/06/09 + 1.6 left femur -1.6, right femur -.5  HYPERTENSION (ICD-401.9)  HYPERLIPIDEMIA (ICD-272.4)  - Target < 130 ldl pos fm hx, hbp  OBESITY  - Target wt = 178 for BMI < 30  ASTHMA (ICD-493.90) (Cassie Campbell not active)  -Xolair s 8/05 > 11/07  -Mastered HFA December 30, 2008  ALLERGIC  RHINITIS (ICD-477.9)  HEALTH MAINTENANCE...........................................................................Marland KitchenWert  - Td 10/06  - Pneumovax 10/04 and October 27, 2009  - Colonscopy 10/28/2004  - CPX November 05, 2010  - GYN per C Cassie Campbell   MED CALENDAR 05/05/11    Review of Systems Constitutional:   No  weight loss, night sweats,  Fevers, chills, fatigue, or  lassitude.  HEENT:   No headaches,  Difficulty swallowing,  Tooth/dental problems, or  Sore throat,                No sneezing, itching, ear ache, nasal congestion, post nasal drip,   CV:  No chest pain,  Orthopnea, PND, swelling in lower extremities, anasarca, dizziness, palpitations, syncope.   GI  No heartburn, indigestion, abdominal pain, nausea, vomiting, diarrhea, change in bowel habits, loss of appetite, bloody stools.   Resp: .  No excess mucus, no productive cough,  No non-productive cough,  No coughing up of blood.  No change in color of mucus.  No wheezing.  No chest wall deformity  Skin: no rash or lesions.  GU: no dysuria, change in color of urine, no urgency or frequency.  No flank pain, no hematuria   MS:  No joint pain or swelling.  No decreased range of motion.    Psych:  No change in mood or  affect. No depression or anxiety.  No memory loss.          Objective:   Physical Exam GEN: A/Ox3; pleasant , NAD, well nourished   HEENT:  Cassie Campbell/AT,  EACs-clear, TMs-wnl, NOSE-clear, THROAT-clear, no lesions, no postnasal drip or exudate noted.   NECK:  Supple w/ fair ROM; no JVD; normal carotid impulses w/o bruits; no thyromegaly or nodules palpated; no lymphadenopathy.  RESP  Coarse BS w/ no wheezing no  accessory muscle use, no dullness to percussion  CARD:  RRR, no m/r/g  , no peripheral edema, pulses intact, no cyanosis or clubbing.  GI:   Soft & nt; nml bowel sounds; no organomegaly or masses detected.  Musco: Warm bil, no deformities or joint swelling noted.   Neuro: alert, no focal deficits  noted.    Skin: Warm, no lesions or rashes         Assessment & Plan:

## 2011-05-07 NOTE — Assessment & Plan Note (Signed)
[facility]                               PULMONARY OFFICE NOTE   NAME:Cassie Campbell, Krupp                    MRN:          [id number]  DATE:10/05/2006                            DOB:          1941-10-31    HISTORY:  This is a delightful 70 year old black female, never smoker, with  severe chronic asthma with marked elevation of IgE, has improved  dramatically since Xolair was started in August, 2005 and no longer taking  allergy vaccinations as of July, 2007 but continuing on Xolair.  She has  been able to dramatically reduce the number of maintenance medicines she  takes with no breakthrough symptoms of asthma, nor nocturnal awakening, nor  need for short-term beta agonist over the last three months.  Overall, she  is very happy with her care.  She denies any exertional chest pain,  orthopnea, PND, TIA, or claudication symptoms.   PAST MEDICAL HISTORY:  1. Hypertension.  2. Benign positional vertigo, not active over the last year.  3. Postmenopausal with mild osteopenia with most recent bone densitometry      in June, 2006.  4. Status post cervical fusion, December, 2001.  5. Internal hemorrhoids, by most recent colonoscopy dated November, 2005.   Medications taken in detail on the work sheet, corrected, and dated October 05, 2006.  Noting that her Advair dose has been reduced to 100/50 b.i.d.   SOCIAL HISTORY:  She has never smoked.  She denies any alcohol use.  She  works as a Naval architect for a Nurse, adult home.  She has little  patient contact.  She lost her daughter in the last year to breast cancer.   FAMILY HISTORY:  Positive for heart disease in her father, who died at age  8.  Mother had a stroke with hypertension and diabetes.  One sister has  diabetes and cirrhosis related to alcoholism.  There is no family history of  any cancer of any kind, except in her daughter, who developed breast cancer.   REVIEW OF SYSTEMS:  Taken  in detail on the work sheet and essentially  negative except as outlined above.   PHYSICAL EXAMINATION:  GENERAL:  This is a very pleasant, ambulatory black  female in no acute distress.  VITAL SIGNS:  She is afebrile with normal vital signs.  Blood pressure  104/66.  Height of 4 feet 11, which is no change in baseline.  HEENT:  Ocular exam was __________ with funduscopy revealing no significant  retinal or arterial change.  Her oropharynx is clear.  Dentition was intact.  Ear canals clear bilaterally.  NECK:  Supple without cervical adenopathy, tinnitus.  Carotids are brisk  bilaterally without any bruits.  CHEST:  Completely clear bilaterally to auscultation and percussion.  HEART:  Regular rhythm without murmur, rub or gallop.  No displacement of  PMI or increase in P2.  ABDOMEN:  Soft, benign with no palpable organomegaly, mass, or tenderness.  EXTREMITIES:  Warm without calf tenderness, clubbing, cyanosis or edema.  Femoral pulses were present bilaterally, and pedal pulses were symmetric  bilaterally.  NEUROLOGIC:  No focal deficits or pathologic reflexes.  SKIN:  Warm and dry.   Chest x-ray was normal.   Lab studies indicated a normal CBC with no significant eosinophilia.  Chemistry profile was unremarkable.  BNP was only 8.  LDL/cholesterol was  102 with an HDL of 36.  Urinalysis revealed no significant proteinuria.  TSH  was normal.   IMPRESSION:  1. Hypertension is actually over-controlled on her present regimen.  I      have recommended that she reduce the clonidine dose to a half pill      b.i.d. and if not able to break that in half, she should break the      Norvasc in half.  2. Asthma is under exceptional control on her present regimen.  For that      reason, I am going to reduce omeprazole down to 1 q.a.m. since the      omeprazole was only added because of the difficulty in nature of her      asthma control.  3. Obesity with a target weight of 133 pounds.  She  has made only minimum      progression towards goal, but she is not that far off of goal at this      point; therefore, I am going to recommend continued effort towards diet      and exercise.  4. Followup will be in six weeks with a set of PFTs to consider whether or      not Advair is warranted (if her PFTs are normal, we could probably      switch her to QVAR at this point).  5. Health maintenance:  She was due influenza today, which was given.  6. Hyperlipidemia:  Her LDL was at goal on Lipitor 20 mg a day with no      evidence of adverse drug effect on the liver; therefore, continue      present regimen.  7. Postmenopausal osteopenia:  She is already on calcium, vitamin D, and      should be recalled for a repeat bone densitometry in July, 2008.  8. Gynecologic/health maintenance:  Per Dr. Arline Asp Romine.            ______________________________  Charlaine Dalton Sherene Sires, MD, [facility]      MBW/MedQ  DD:  10/06/2006  DT:  10/09/2006  Job #:  [id number]   cc:   Edwena Felty. Romine, M.D.

## 2011-05-07 NOTE — Assessment & Plan Note (Signed)
Controlled on rx  bmet today pending.

## 2011-05-07 NOTE — Assessment & Plan Note (Signed)
Controlled on current regimen Patient's medications were reviewed today and patient education was given. Computerized medication calendar was adjusted/completed Plan:  Cont on current regimen.

## 2011-05-07 NOTE — Assessment & Plan Note (Signed)
At goal  Not fasting . Will check LFTs today.

## 2011-05-07 NOTE — H&P (Signed)
[facility]  Patient:    Cassie Campbell, Cassie Campbell                   MRN: [id number] Adm. Date:  11/23/00 Attending:  Kerrin Champagne, M.D. Dictator:   Alexzandrew L. Julien Girt, P.A.-C. CC:         Charlaine Dalton. [name], M.D. [facility]   History and Physical  DATE OF OFFICE VISIT/HISTORY AND PHYSICAL:  November 14, 2000  CHIEF COMPLAINT:  Neck pain, left shoulder pain, left arm pain with numbness in the fingers.  HISTORY OF PRESENT ILLNESS:  The patient is a 70 year old female who has been seen in evaluation with Dr. Vira Browns for symptoms for several months now. She complains of neck pain that radiates down into her shoulder and left arm. She also has complained of numbness into the fingers of the left hand.  She has been told she has some degenerative disk disease in her neck and had a flare up several years ago, however, this improved with conservative management and medications.  She, over the past several months, has had increasing pain over her neck and has not improved due to conservative measures.  She was therefore sent for an MRI which showed disk herniations and cord deformity at C3-4, C5-6, C6-7 and T2-3 levels.  These are felt to be significant and felt to be the source of her neck and radicular type pain into her left upper arm.  Due to the fact the patient has not improved with conservative measures, it is felt that she could possibly benefit from undergoing surgical intervention.  Risks and benefits of the procedure have been discussed with the patient.  She has elected to proceed with surgery.  ALLERGIES:  ASPIRIN causes throat swelling.  PENICILLIN causes swelling and rash.  INTOLERANCES:  CODEINE causes upset stomach (patient has taken hydrocodone and oxycodone without symptoms).  CIPRO causes nausea.  DARVOCET causes nausea.  PAST MEDICAL HISTORY:  Asthma, hypertension, hypercholesterolemia, post menopausal.  PAST SURGICAL HISTORY:  Bilateral  bunionectomies.  SOCIAL HISTORY:  The patient is married and has two children.  Denies using tobacco products or alcohol products.  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  FAMILY HISTORY:  Mother deceased age 67 with diabetes and hypertension. Father deceased age 22 with heart disease.  REVIEW OF SYSTEMS:  GENERAL:  No fevers, chills.  The patient has intermittent night sweats.  She attributes this to current menopause.  NEUROLOGIC:  No seizures, syncope or paralysis.  RESPIRATORY:  No shortness of breath, productive cough or hemoptysis.  However, her most recent asthma attack has been approximately three weeks ago.  CARDIOVASCULAR:  No chest pain, angina or orthopnea.  GI:  No nausea, vomiting, diarrhea or constipation.  No blood or mucus in the stool.  GU:  No dysuria, hematuria or discharge. MUSCULOSKELETAL:  Pertinent to the neck and shoulder found in history of present illness.  PHYSICAL EXAMINATION:  VITAL SIGNS:  Pulse 80, respirations 16, blood pressure 130/98.  GENERAL:  The patient is a 70 year old female, alert, oriented, in no acute distress.  HEENT:  Normocephalic, atraumatic.  Pupils are round and reactive.  The patient does not wear glasses.  EOMs are intact.  NECK:  Supple, no carotid bruits.  CHEST:  Clear to auscultation and percussion.  No rhonchi or rales.  HEART:  Regular rate and rhythm, no murmurs, S1 and S2 noted.  ABDOMEN:  Soft, nontender, bowel sounds present,  no rebound or guarding.  RECTAL/BREASTS/GENITALIA:  Not done, not pertinent to present illness.  EXTREMITIES:  Significant to that of left upper extremity.  Deep tendon reflexes in the left upper arm, +2 biceps, +1 triceps, +2 brachioradialis.  Cervical radiculopathy symptoms along the C6 and C7 distribution.  She has diminished range of motion in the neck, especially to the left side, lateral bending.  LABORATORY DATA:  X-ray/MRI:   MRI proved to be positive for disk herniations and cord deformities noted at C3-4, C5-6, C6-7, and T2-3.  IMPRESSION: 1. 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 2. Asthma. 3. Hypertension. 4. Hypercholesterolemia.  PLAN:  The patient will be admitted to [facility] to undergo C3-4 anterior cervical fusion with C5-6 and C6-7 anterior cervical diskectomy and fusions with utilizing plates and screws.  The patient has donated two units of autologous blood in preparation for the upcoming surgery. DD:  11/14/00 TD:  11/14/00 Job: [id number]

## 2011-05-07 NOTE — Assessment & Plan Note (Signed)
[facility]                             PULMONARY OFFICE NOTE   NAME:Cassie Campbell, Cassie Campbell                    MRN:          [id number]  DATE:01/18/2007                            DOB:          1941/02/15    PRIMARY SERVICE FOLLOWUP OFFICE VISIT   HISTORY:  A 70 year old black female with chronic asthma, hypertension  in for follow up PFTs, having eliminated Xolair from her regimen because  of expense on October 31, 2006, but initially having no symptoms at all  of any break through coughing, wheezing or need for rescue therapy or  nocturnal symptoms.   For full list of medications please see face sheet dated January 18, 2007, which correlates nicely with medication calendar.   PHYSICAL EXAMINATION:  She is an ambulatory, minimally obese black  female in no acute stress.  Stable vital signs.  Blood pressure 120/76.  HEENT:  Is unremarkable. Oropharynx clear.  LUNGS:  Fields reveal diminished breath sounds bilaterally with no  wheezing.  There is a regular rhythm without murmur, gallop or rub.  ABDOMEN:  Soft, benign.  EXTREMITIES:  Warm without calf tenderness, cyanosis or clubbing.   PFTs reveal no significant change in FEV1 around 1.3 L with a ratio of  57%, still consistent with moderate air flow obstruction.   IMPRESSION:  1. Moderate air flow obstruction secondary to asthma with an      irreversible component with no significant flare-up or limiting      symptoms on her present regimen.  I therefore asked her to continue      all of her medicines as they are and I reviewed them with her in      the context of the medication calendar.  2. Hypertension is well controlled on her present regimen which I did      not change.  3. Followup will be every 3 months, sooner if needed.     Charlaine Dalton. Sherene Sires, MD, [facility]  Electronically Signed    MBW/MedQ  DD: 01/18/2007  DT: 01/18/2007  Job #: [id number]

## 2011-05-07 NOTE — Assessment & Plan Note (Signed)
[name]                             PULMONARY OFFICE NOTE   NAME:Cassie Campbell, Cassie Campbell                    MRN:          [id number]  DATE:04/18/2007                            DOB:          [birth year]    HISTORY:  A 70 year old, black female in for followup evaluation of  asthma and hypertension doing great since her previous visit and no  longer taking Xolair effective November 2007. She has been controlled  with Advair 100/50 b.i.d. with no need for rescue therapy with albuterol  nor significant cough or exacerbations of any kind (she has the  contingency to use Singulair and Pulmicort for exacerbations).   For a full inventory of medications please see face sheet, dated April 18, 2007.   PHYSICAL EXAMINATION:  GENERAL:  She is a pleasant, ambulatory, black  female in no acute distress.  VITAL SIGNS:  Blood pressure 142/100 after taking a half a clonidine  this morning.  HEENT:  Unremarkable. Pharynx clear.  LUNGS:  Lung fields clear bilaterally to auscultation and percussion.  HEART:  Regular rate and rhythm without murmur, gallop or rub.  ABDOMEN:  Soft and benign.  EXTREMITIES:  Warm without calf tenderness, cyanosis, clubbing or edema.   IMPRESSION:  1. Her asthma is under excellent control with combination therapy of      Advair with minimum need for rescue therapy and no significant      breakthrough symptoms. I have recommended no further change in      therapy but did review each of her contingency medications.  2. Hypertension is not well controlled. I recommended increasing      clonidine dose in the morning to a whole pill and continue the h.s.      dose as well.   Followup will be in 6 weeks for a full medication reconciliation and I  will see her back in 3 months from now for general medical care.  Asthma/allergy followup can be p.r.n. henceforth.     Charlaine Dalton. Sherene Sires, MD, [facility]  Electronically Signed    MBW/MedQ  DD:  04/18/2007  DT: 04/19/2007  Job #: [id number]

## 2011-05-11 ENCOUNTER — Encounter: Payer: Self-pay | Admitting: Internal Medicine

## 2011-06-06 ENCOUNTER — Inpatient Hospital Stay (INDEPENDENT_AMBULATORY_CARE_PROVIDER_SITE_OTHER)
Admission: RE | Admit: 2011-06-06 | Discharge: 2011-06-06 | Disposition: A | Discharge status: Home / Self Care | Payer: Medicare Other | Source: Ambulatory Visit | Attending: Family Medicine | Admitting: Family Medicine

## 2011-06-06 DIAGNOSIS — L259 Unspecified contact dermatitis, unspecified cause: Secondary | ICD-10-CM

## 2011-07-28 ENCOUNTER — Other Ambulatory Visit (INDEPENDENT_AMBULATORY_CARE_PROVIDER_SITE_OTHER): Payer: Medicare Other

## 2011-07-28 ENCOUNTER — Other Ambulatory Visit: Payer: Self-pay | Admitting: Internal Medicine

## 2011-07-28 ENCOUNTER — Encounter: Payer: Self-pay | Admitting: Internal Medicine

## 2011-07-28 ENCOUNTER — Ambulatory Visit (INDEPENDENT_AMBULATORY_CARE_PROVIDER_SITE_OTHER): Payer: Medicare Other | Admitting: Internal Medicine

## 2011-07-28 VITALS — BP 120/80 | HR 87 | Temp 98.2°F | Ht 59.0 in | Wt 142.4 lb

## 2011-07-28 DIAGNOSIS — M899 Disorder of bone, unspecified: Secondary | ICD-10-CM

## 2011-07-28 DIAGNOSIS — E785 Hyperlipidemia, unspecified: Secondary | ICD-10-CM

## 2011-07-28 DIAGNOSIS — I10 Essential (primary) hypertension: Secondary | ICD-10-CM

## 2011-07-28 DIAGNOSIS — J45909 Unspecified asthma, uncomplicated: Secondary | ICD-10-CM

## 2011-07-28 DIAGNOSIS — M949 Disorder of cartilage, unspecified: Secondary | ICD-10-CM

## 2011-07-28 DIAGNOSIS — E876 Hypokalemia: Secondary | ICD-10-CM

## 2011-07-28 LAB — LIPID PANEL
Cholesterol: 161 mg/dL (ref 0–200)
LDL Cholesterol: 90 mg/dL (ref 0–99)
Triglycerides: 104 mg/dL (ref 0.0–149.0)

## 2011-07-28 LAB — BASIC METABOLIC PANEL
BUN: 11 mg/dL (ref 6–23)
CO2: 31 mEq/L (ref 19–32)
Chloride: 99 mEq/L (ref 96–112)
Creatinine, Ser: 0.7 mg/dL (ref 0.4–1.2)

## 2011-07-28 LAB — HEPATIC FUNCTION PANEL
ALT: 21 U/L (ref 0–35)
Total Bilirubin: 1.2 mg/dL (ref 0.3–1.2)

## 2011-07-28 MED ORDER — POTASSIUM CHLORIDE CRYS ER 20 MEQ PO TBCR: EXTENDED_RELEASE_TABLET | ORAL | Status: DC

## 2011-07-28 NOTE — Patient Instructions (Signed)
We will call you with results  Try tonic water or quinine as needed for cramps  Return after Nov 17 for CPX

## 2011-07-28 NOTE — Progress Notes (Signed)
Quick Note:  Spoke with pt and notified of results per Dr. Sherene Sires. Pt verbalized understanding and denied any questions. Pt states will come for recheck bmet 08/11/11.   ______

## 2011-07-28 NOTE — Progress Notes (Signed)
Subjective:    Patient ID: Cassie Campbell, female    DOB: [birth year], 70 y.o.   MRN: [id number]  HPI 3 yobf never smoker with severe chronic asthma and previous history to suggest marked atopy with IgE level in excess of 10,000 cw abpa. completed xolair 11/07   December 24, 2009 ov nasal blockage on right transiently better with afrin with recurrent yellow drainage, no problem with sob. rec prednisone x 6 days and 20 days cephalexin   January 27, 2010 ov all better, no nasal co's or sob or cough.   May 04, 2010 ov c/o nasal congestion off and on since last seen- afrin and nasal saline help. no change   August 05, 2010 3 month follow up. Pt c/o right nostril congestion with occ green drainage not getting better. Breathing is the same with occ wheezing with relief from inhaler. Green drainage always from right side where had surgery on teeth 6 months ago and problems since then. not using much afrin and never saba. rx x 20 days, resolved no need for sinus ct   November 05, 2010 ov cpx mild uri but no purulent sputum. rec no change , follow med calendar   February 03, 2011 ov cc wheezing x 4 days so increase proaire to q4h x not using while sleeping, no purulent sputum.    05/05/11  Follow up and Med review Pt returns for follow up and med review. She is not fasting today. Says she is doing well We reviewed all her meds and organized her med calendar with pt education  NO ER /hosp visits since last ov. No increased SABA use.  rec no change rx   07/28/2011 f/u ov/Cassie Campbell cc cramping new onset x sev months a couple of times a week mostly daytime. occ dry  cough p symbicort and qvar  Sleeping ok without nocturnal  or early am exac of resp c/o's or need for noct saba. Pt denies any significant sore throat, dysphagia, itching, sneezing,  nasal congestion or excess/ purulent secretions,  fever, chills, sweats, unintended wt loss, pleuritic or exertional cp, hempoptysis, orthopnea pnd or leg swelling.     Also denies any obvious fluctuation of symptoms with weather or environmental changes or other aggravating or alleviating factors.       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)  OSTEOPENIA (ICD-733.90)  - DEXA 08/22/07 AP spine + 1.1, left femur -1.3, right femur -.8  - DEXA 10/06/09 + 1.6 left femur -1.6, right femur -.5  HYPERTENSION (ICD-401.9)  HYPERLIPIDEMIA (ICD-272.4)  - Target < 130 ldl pos fm hx, hbp  OBESITY  - Target wt = 178 for BMI < 30  ASTHMA (ICD-493.90) ([name] not active)  -Xolair s 8/05 > 11/07  -Mastered HFA December 30, 2008  ALLERGIC RHINITIS (ICD-477.9)  HEALTH MAINTENANCE...........................................................................Marland KitchenWert  - Td 09/2005  - Pneumovax 10/04 and October 27, 2009  - Colonscopy 10/28/2004  - CPX November 05, 2010  - GYN per [name]   MED CALENDAR 05/05/11    Review of Systems Constitutional:   No  weight loss, night sweats,  Fevers, chills, fatigue, or  lassitude.  HEENT:   No headaches,  Difficulty swallowing,  Tooth/dental problems, or  Sore throat,                No sneezing, itching, ear ache, nasal congestion, post nasal drip,   CV:  No chest pain,  Orthopnea, PND, swelling in lower extremities, anasarca, dizziness, palpitations, syncope.  GI  No heartburn, indigestion, abdominal pain, nausea, vomiting, diarrhea, change in bowel habits, loss of appetite, bloody stools.   Resp: .  No excess mucus, no productive cough,  No non-productive cough,  No coughing up of blood.  No change in color of mucus.  No wheezing.  No chest wall deformity  Skin: no rash or lesions.  GU: no dysuria, change in color of urine, no urgency or frequency.  No flank pain, no hematuria   MS:  Cramping as above.    Psych:  No change in mood or affect. No depression or anxiety.  No memory loss.          Objective:   Physical Exam  A/Ox3; pleasant , NAD  wt 150 January 27, 2010 > 139 November 05, 2010  > 141 February 03, 2011 > 142 07/29/2011  HEENT: [place]/AT, , EACs-clear, TMs-wnl, NOSE-clear, THROAT-clear upper dentures  NECK: Supple w/ fair ROM; no JVD; normal carotid impulses w/o bruits; no thyromegaly or nodules palpated; no lymphadenopathy.  RESP mod reduction in air movement bilaterally with end exp wheeze, mod increase Texp  CARD: RRR, no m/r/g  GI: Soft & nt; nml bowel sounds; no organomegaly or masses detected.  Musco: Warm bil, no calf tenderness edema, clubbing, pulses intact  Skin No rash          Assessment & Plan:

## 2011-07-29 NOTE — Assessment & Plan Note (Signed)
All goals of chronic asthma control met including optimal function and elimination of symptoms with minimal need for rescue therapy.  Contingencies discussed in full including contacting this office immediately if not controlling the symptoms using the rule of two's.       Each maintenance medication was reviewed in detail including most importantly the difference between maintenance and as needed and under what circumstances the prns are to be used. This was done in the context of a medication calendar review which provided the patient with a user-friendly unambiguous mechanism for medication administration and reconciliation and provides an action plan for all active problems. It is critical that this be shown to every doctor  for modification during the office visit if necessary so the patient can use it as a working document.     

## 2011-07-29 NOTE — Assessment & Plan Note (Signed)
Due for repeat dexa 11/2011

## 2011-07-29 NOTE — Assessment & Plan Note (Signed)
Diruetic induced cramping with K 2.8 needs to be corrected and rechecked in 2 weeks as she is on a K sparing rx

## 2011-07-29 NOTE — Assessment & Plan Note (Signed)
Adequate control on present rx, reviewed  

## 2011-08-03 ENCOUNTER — Other Ambulatory Visit: Payer: Self-pay | Admitting: Internal Medicine

## 2011-08-11 ENCOUNTER — Other Ambulatory Visit (INDEPENDENT_AMBULATORY_CARE_PROVIDER_SITE_OTHER): Payer: Medicare Other

## 2011-08-11 DIAGNOSIS — E876 Hypokalemia: Secondary | ICD-10-CM

## 2011-08-11 LAB — BASIC METABOLIC PANEL
BUN: 13 mg/dL (ref 6–23)
Calcium: 9.8 mg/dL (ref 8.4–10.5)
Creatinine, Ser: 0.8 mg/dL (ref 0.4–1.2)
GFR: 92.82 mL/min (ref >60.00)
Glucose, Bld: 107 mg/dL — ABNORMAL HIGH (ref 70–99)
Sodium: 139 mEq/L (ref 135–145)

## 2011-08-17 NOTE — Progress Notes (Signed)
Quick Note:  Spoke with pt and notified of results per Dr. [name]. Pt verbalized understanding and denied any questions.  ______ 

## 2011-09-06 ENCOUNTER — Other Ambulatory Visit: Payer: Self-pay | Admitting: *Deleted

## 2011-09-06 MED ORDER — ALBUTEROL SULFATE HFA 108 (90 BASE) MCG/ACT IN AERS: 2.0000 | INHALATION_SPRAY | Freq: Four times a day (QID) | RESPIRATORY_TRACT | Status: DC | PRN

## 2011-10-25 ENCOUNTER — Encounter: Payer: Self-pay | Admitting: Internal Medicine

## 2011-11-09 ENCOUNTER — Other Ambulatory Visit (INDEPENDENT_AMBULATORY_CARE_PROVIDER_SITE_OTHER): Payer: Medicare Other

## 2011-11-09 ENCOUNTER — Ambulatory Visit (INDEPENDENT_AMBULATORY_CARE_PROVIDER_SITE_OTHER): Payer: Medicare Other | Admitting: Internal Medicine

## 2011-11-09 ENCOUNTER — Encounter: Payer: Self-pay | Admitting: Internal Medicine

## 2011-11-09 ENCOUNTER — Ambulatory Visit (INDEPENDENT_AMBULATORY_CARE_PROVIDER_SITE_OTHER)
Admission: RE | Admit: 2011-11-09 | Discharge: 2011-11-09 | Disposition: A | Discharge status: Home / Self Care | Payer: Medicare Other | Source: Ambulatory Visit | Attending: Internal Medicine | Admitting: Internal Medicine

## 2011-11-09 VITALS — BP 140/90 | HR 78 | Temp 97.4°F | Ht 59.0 in | Wt 143.0 lb

## 2011-11-09 DIAGNOSIS — Z23 Encounter for immunization: Secondary | ICD-10-CM

## 2011-11-09 DIAGNOSIS — E785 Hyperlipidemia, unspecified: Secondary | ICD-10-CM

## 2011-11-09 DIAGNOSIS — J309 Allergic rhinitis, unspecified: Secondary | ICD-10-CM

## 2011-11-09 DIAGNOSIS — B379 Candidiasis, unspecified: Secondary | ICD-10-CM

## 2011-11-09 DIAGNOSIS — B372 Candidiasis of skin and nail: Secondary | ICD-10-CM | POA: Insufficient documentation

## 2011-11-09 DIAGNOSIS — J45909 Unspecified asthma, uncomplicated: Secondary | ICD-10-CM

## 2011-11-09 DIAGNOSIS — I10 Essential (primary) hypertension: Secondary | ICD-10-CM

## 2011-11-09 DIAGNOSIS — R930 Abnormal findings on diagnostic imaging of skull and head, not elsewhere classified: Secondary | ICD-10-CM

## 2011-11-09 LAB — BASIC METABOLIC PANEL
Calcium: 9.7 mg/dL (ref 8.4–10.5)
GFR: 92.75 mL/min (ref >60.00)
Glucose, Bld: 94 mg/dL (ref 70–99)
Potassium: 3.4 mEq/L — ABNORMAL LOW (ref 3.5–5.1)
Sodium: 137 mEq/L (ref 135–145)

## 2011-11-09 LAB — URINALYSIS
Nitrite: NEGATIVE
Specific Gravity, Urine: 1.015 (ref 1.000–1.030)
Urine Glucose: NEGATIVE
Urobilinogen, UA: 1 (ref 0.0–1.0)

## 2011-11-09 LAB — LIPID PANEL
Cholesterol: 188 mg/dL (ref 0–200)
HDL: 50 mg/dL (ref >39.00)
Triglycerides: 121 mg/dL (ref 0.0–149.0)
VLDL: 24.2 mg/dL (ref 0.0–40.0)

## 2011-11-09 LAB — TSH: TSH: 1.4 u[IU]/mL (ref 0.35–5.50)

## 2011-11-09 LAB — HEPATIC FUNCTION PANEL
ALT: 34 U/L (ref 0–35)
AST: 30 U/L (ref 0–37)
Albumin: 4.1 g/dL (ref 3.5–5.2)
Alkaline Phosphatase: 65 U/L (ref 39–117)
Total Protein: 8.2 g/dL (ref 6.0–8.3)

## 2011-11-09 MED ORDER — NYSTATIN 100000 UNIT/GM EX POWD: CUTANEOUS | Status: AC

## 2011-11-09 NOTE — Assessment & Plan Note (Signed)
Adequate control on present rx, reviewed  

## 2011-11-09 NOTE — Patient Instructions (Signed)
See calendar for specific medication instructions and bring it back for each and every office visit for every healthcare provider you see.  Without it,  you may not receive the best quality medical care that we feel you deserve.  You will note that the calendar groups together  your maintenance  medications that are timed at particular times of the day.  Think of this as your checklist for what your doctor has instructed you to do until your next evaluation to see what benefit  there is  to staying on a consistent group of medications intended to keep you well.  The other group at the bottom is entirely up to you to use as you see fit  for specific symptoms that may arise between visits that require you to treat them on an as needed basis.  Think of this as your action plan or "what if" list.   Separating the top medications from the bottom group is fundamental to providing you adequate care going forward.    Try off the lipitor to see if aches better and if so can call you in an non-generic form to try  Nystatin powder as needed for rash  Please remember to go to the lab and x-ray department downstairs for your tests - we will call you with the results when they are available.     Please schedule a follow up visit in 3 months but call sooner if needed

## 2011-11-09 NOTE — Assessment & Plan Note (Signed)
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

## 2011-11-09 NOTE — Assessment & Plan Note (Signed)
Adequate control on present rx, reviewed options for muscle aches > try off generic lipitor first to see if helps and if so can either re-challenge with generics or change back to non-generic.

## 2011-11-09 NOTE — Progress Notes (Signed)
Subjective:    Patient ID: Cassie Campbell, female    DOB: [birth year], 70 y.o.   MRN: [id number]  HPI     9 yobf never smoker with severe chronic asthma and previous history to suggest marked atopy with IgE level in excess of 10,000 cw abpa. completed xolair 10/2006  .  05/05/11 Follow up and Med review  Pt returns for follow up and med review. She is not fasting today. Says she is doing well  We reviewed all her meds and organized her med calendar with pt education  NO ER /hosp visits since last ov. No increased SABA use.  rec no change rx   07/28/2011 f/u ov/[name] cc cramping new onset x sev months a couple of times a week mostly daytime. occ dry cough p symbicort and qvar  rec  Try tonic water or quinine as needed for cramps > resolved   11/09/2011  [name]/ [name]  C/o rash under breasts waxes and wanes x months, better with nystatin,  cream but not resolved also aching  in both upper thighs since changed to generic lipitor, all tight feeling in am and better after activity/ stretching. No weakness  Sleeping ok without nocturnal or early am exac of resp c/o's or need for noct saba. Pt denies any significant sore throat, dysphagia, itching, sneezing, nasal congestion or excess/ purulent secretions, fever, chills, sweats, unintended wt loss, pleuritic or exertional cp, hempoptysis, orthopnea pnd or leg swelling.  Also denies any obvious fluctuation of symptoms with weather or environmental changes or other aggravating or alleviating factors.   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)  OSTEOPENIA (ICD-733.90)  - DEXA 08/22/07 AP spine + 1.1, left femur -1.3, right femur -.8  - DEXA 10/06/09 + 1.6 left femur -1.6, right femur -.5  HYPERTENSION (ICD-401.9)  HYPERLIPIDEMIA (ICD-272.4)  - Target < 130 ldl pos fm hx, hbp  OBESITY  - Target wt = 178 for BMI < 30  ASTHMA (ICD-493.90) ([name] not active)  -Xolair s 8/05 > 11/07  -Mastered HFA December 30, 2008    ALLERGIC RHINITIS (ICD-477.9)  HEALTH MAINTENANCE...........................................................................Marland KitchenWert  - Td 09/2005  - Pneumovax 10/04 and October 27, 2009  - Colonscopy 10/28/2004  - CPX 11/09/2011   - GYN per [name]  MED CALENDAR 05/05/11    Review of Systems  Constitutional: Negative for fever, chills, diaphoresis, activity change, appetite change, fatigue and unexpected weight change.  HENT: Negative for hearing loss, ear pain, nosebleeds, congestion, sore throat, facial swelling, rhinorrhea, sneezing, mouth sores, trouble swallowing, neck pain, neck stiffness, dental problem, voice change, postnasal drip, sinus pressure, tinnitus and ear discharge.   Eyes: Negative for photophobia, discharge, itching and visual disturbance.  Respiratory: Negative for apnea, cough, choking, chest tightness, shortness of breath, wheezing and stridor.   Cardiovascular: Negative for chest pain, palpitations and leg swelling.  Gastrointestinal: Negative for nausea, vomiting, abdominal pain, constipation, blood in stool and abdominal distention.  Genitourinary: Negative for dysuria, urgency, frequency, hematuria, flank pain, decreased urine volume and difficulty urinating.  Musculoskeletal: Positive for myalgias. Negative for back pain, joint swelling, arthralgias and gait problem.  Skin: Positive for rash. Negative for color change and pallor.  Neurological: Negative for dizziness, tremors, seizures, syncope, speech difficulty, weakness, light-headedness, numbness and headaches.  Hematological: Negative for adenopathy. Bruises/bleeds easily.  Psychiatric/Behavioral: Negative for confusion, sleep disturbance and agitation. The patient is not nervous/anxious.        Objective:   Physical Exam  Wt 143 11/09/2011  Top set of dentures  HEENT: nl turbinates, and orophanx. Top set of dentures  Nl external ear canals without cough reflex   NECK :  without JVD/Nodes/TM/ nl  carotid upstrokes bilaterally   LUNGS: no acc muscle use, clear to A and P bilaterally without cough on insp or exp maneuvers   CV:  RRR  no s3 ,  II/VI sem no decrease in A2 or increase in P2, no edema   ABD:  soft and nontender with nl excursion in the supine position. No bruits or organomegaly, bowel sounds nl  MS:  warm without deformities, calf tenderness, cyanosis or clubbing  SKIN: warm and dry with classic intertriginous candidiasis both breast folds, mild   NEURO:  alert, approp, no deficits    CXR  11/09/2011 :  Unchanged cardiac silhouette and mediastinal contours. Unchanged ill-defined right infrahilar heterogeneous opacities with associated band like opacity on the lateral radiograph, unchanged compared to the 2009 examination and again suggestive of chronic right middle lobe atelectasis. No new focal parenchymal opacities. No definite pleural effusion or pneumothorax. Unchanged bones including lower cervical ACDF, incompletely evaluated.  IMPRESSION: Chronic right middle lobe atelectasis.      Assessment & Plan:

## 2011-11-09 NOTE — Assessment & Plan Note (Signed)
Try nystatin powder

## 2011-11-16 ENCOUNTER — Telehealth: Payer: Self-pay | Admitting: Internal Medicine

## 2011-11-16 MED ORDER — CLOTRIMAZOLE-BETAMETHASONE 1-0.05 % EX CREA: TOPICAL_CREAM | Freq: Two times a day (BID) | CUTANEOUS | Status: DC

## 2011-11-16 NOTE — Telephone Encounter (Signed)
Per Dr.Nadel, call in Lotrisone Cream, 1 tube, apply twice daily to affected area. Pt is aware and rx sent to [facility] per pt request. Will forward msg to MW so he is aware.

## 2011-11-16 NOTE — Telephone Encounter (Signed)
I spoke with pt and she states she saw MW on 11/09/2011 for rash under her breast and was given nystan powder rx. Pt states it has not helped and is now very itchy and red. Pt is requesting a different rx be called in for her. Will forward to doc of the day since MW is not in the office. Please advise Dr. Kriste Basque, thanks  Allergies  Allergen Reactions  . Codeine Phosphate Other (See Comments)    GI Upset  . Aspirin Swelling and Rash  . Diphenhydramine Hcl Rash  . Penicillins Swelling    REACTION: unspecified

## 2011-12-03 ENCOUNTER — Other Ambulatory Visit: Payer: Self-pay | Admitting: *Deleted

## 2011-12-03 MED ORDER — CLOTRIMAZOLE-BETAMETHASONE 1-0.05 % EX CREA: TOPICAL_CREAM | Freq: Two times a day (BID) | CUTANEOUS | Status: DC

## 2011-12-03 NOTE — Telephone Encounter (Signed)
Received refill fax from [facility] on pt's clotromazole topical cream. This was faxed back x 0 refills by [name] Shin CMA

## 2011-12-07 ENCOUNTER — Encounter: Payer: Self-pay | Admitting: Gastroenterology

## 2012-01-06 ENCOUNTER — Telehealth: Payer: Self-pay | Admitting: Allergy

## 2012-01-06 MED ORDER — CLONIDINE HCL 0.1 MG PO TABS: 0.1000 mg | ORAL_TABLET | Freq: Two times a day (BID) | ORAL | Status: DC

## 2012-01-06 NOTE — Telephone Encounter (Signed)
Addended by: Christen Butter on: 01/06/2012 03:14 PM   Modules accepted: Orders

## 2012-01-06 NOTE — Telephone Encounter (Signed)
[facility] requesting rx for Clonidine 0.1 mg #100 Take 1 bid  Last fill was 11/ 26 /2012   Allergies  Allergen Reactions  . Codeine Phosphate Other (See Comments)    GI Upset  . Aspirin Swelling and Rash  . Diphenhydramine Hcl Rash  . Penicillins Swelling    REACTION: unspecified   Dr Sherene Sires is this ok to fil  thanks

## 2012-01-06 NOTE — Telephone Encounter (Signed)
Accidentally closed encounter

## 2012-01-06 NOTE — Telephone Encounter (Signed)
Ok to refill 

## 2012-01-31 ENCOUNTER — Encounter: Payer: Self-pay | Admitting: Internal Medicine

## 2012-01-31 ENCOUNTER — Ambulatory Visit (INDEPENDENT_AMBULATORY_CARE_PROVIDER_SITE_OTHER): Payer: Medicare Other | Admitting: Internal Medicine

## 2012-01-31 DIAGNOSIS — I10 Essential (primary) hypertension: Secondary | ICD-10-CM

## 2012-01-31 DIAGNOSIS — E785 Hyperlipidemia, unspecified: Secondary | ICD-10-CM

## 2012-01-31 DIAGNOSIS — J45909 Unspecified asthma, uncomplicated: Secondary | ICD-10-CM

## 2012-01-31 NOTE — Assessment & Plan Note (Signed)
All goals of chronic asthma control met including optimal function and elimination of symptoms with minimal need for rescue therapy.  Contingencies discussed in full including contacting this office immediately if not controlling the symptoms using the rule of two's.  Since doing so well on qvar will change the symbicort to 1-2 in am and 0-2 in pm as per calendar       Each maintenance medication was reviewed in detail including most importantly the difference between maintenance and as needed and under what circumstances the prns are to be used. This was done in the context of a medication calendar review which provided the patient with a user-friendly unambiguous mechanism for medication administration and reconciliation and provides an action plan for all active problems. It is critical that this be shown to every doctor  for modification during the office visit if necessary so the patient can use it as a working document.

## 2012-01-31 NOTE — Progress Notes (Signed)
Subjective:    Patient ID: Cassie Campbell, female    DOB: 1941-10-31 .   MRN: [id number]   Brief patient profile:  89 yobf never smoker with severe chronic asthma and previous history to suggest marked atopy with IgE level in excess of 10,000 cw abpa. completed xolair 10/2006      HPI 07/28/2011 f/u ov/[name] cc cramping new onset x sev months a couple of times a week mostly daytime. occ dry cough p symbicort and qvar  rec  Try tonic water or quinine as needed for cramps > resolved   11/09/2011  [name]/ [name]  C/o rash under breasts waxes and wanes x months, better with nystatin,  cream but not resolved also aching  in both upper thighs since changed to generic lipitor, all tight feeling in am and better after activity/ stretching. No weakness rec Try off the lipitor to see if aches better and if so can call you in an non-generic form to try  Nystatin powder as needed for rash  Please remember to go to the lab and x-ray department downstairs for your tests  Follow med calendar  01/31/2012 f/u ov/[name] cc aches and pains min on or off lipitor so resumed rx, rash gone, no sob on symbicort titrated down to 1 bid and no cough  Sleeping ok without nocturnal  or early am exacerbation  of respiratory  c/o's or need for noct saba. Also denies any obvious fluctuation of symptoms with weather or environmental changes or other aggravating or alleviating factors except as outlined above    ROS  At present neg for  any significant sore throat, dysphagia, itching, sneezing,  nasal congestion or excess/ purulent secretions,  fever, chills, sweats, unintended wt loss, pleuritic or exertional cp, hempoptysis, orthopnea pnd or leg swelling.  Also denies presyncope, palpitations, heartburn, abdominal pain, nausea, vomiting, diarrhea  or change in bowel or urinary habits, dysuria,hematuria,  rash, arthralgias, visual complaints, headache, numbness weakness or ataxia.     Past Medical History:  BENIGN  POSITIONAL VERTIGO (ICD-386.11)  OSTEOPENIA (ICD-733.90)  - DEXA 08/22/07 AP spine + 1.1, left femur -1.3, right femur -.8  - DEXA 10/06/09 + 1.6 left femur -1.6, right femur -.5  HYPERTENSION (ICD-401.9)  HYPERLIPIDEMIA (ICD-272.4)  - Target < 130 ldl pos fm hx, hbp  OBESITY  - Target wt = 178 for BMI < 30  ASTHMA (ICD-493.90) ([name] not active)  -Xolair s 8/05 > 11/07  -Mastered HFA December 30, 2008  ALLERGIC RHINITIS (ICD-477.9)  HEALTH MAINTENANCE...........................................................................Marland KitchenWert  - Td 09/2005  - Pneumovax 10/04 and October 27, 2009  - Colonscopy 10/28/2004  - CPX 11/09/2011   - GYN per [name]  MED CALENDAR 05/05/11         Objective:   Physical Exam  Wt 143 11/09/2011 > 01/31/2012 > 142   Top set of dentures  HEENT: nl turbinates, and orophanx. Top set of dentures  Nl external ear canals without cough reflex   NECK :  without JVD/Nodes/TM/ nl carotid upstrokes bilaterally   LUNGS: no acc muscle use, clear to A and P bilaterally without cough on insp or exp maneuvers   CV:  RRR  no s3 ,  II/VI sem no decrease in A2 or increase in P2, no edema   ABD:  soft and nontender with nl excursion in the supine position. No bruits or organomegaly, bowel sounds nl  MS:  warm without deformities, calf tenderness, cyanosis or clubbing  CXR  11/09/2011 : Unchanged cardiac silhouette and mediastinal contours. Unchanged ill-defined right infrahilar heterogeneous opacities with associated band like opacity on the lateral radiograph, unchanged compared to the 2009 examination and again suggestive of chronic right middle lobe atelectasis. No new focal parenchymal opacities. No definite pleural effusion or pneumothorax. Unchanged bones including lower cervical ACDF, incompletely evaluated.  IMPRESSION: Chronic right middle lobe atelectasis.      Assessment & Plan:

## 2012-01-31 NOTE — Assessment & Plan Note (Signed)
Adequate control on present rx, reviewed  

## 2012-01-31 NOTE — Assessment & Plan Note (Signed)
Lab Results  Component Value Date   LDLCALC 114* 11/09/2011    At target on lipitor with tolerable muslce aches > no change rx

## 2012-01-31 NOTE — Patient Instructions (Addendum)

## 2012-02-08 ENCOUNTER — Other Ambulatory Visit: Payer: Self-pay | Admitting: *Deleted

## 2012-02-08 MED ORDER — BECLOMETHASONE DIPROPIONATE 80 MCG/ACT IN AERS: 2.0000 | INHALATION_SPRAY | Freq: Two times a day (BID) | RESPIRATORY_TRACT | Status: DC

## 2012-02-23 ENCOUNTER — Telehealth: Payer: Self-pay | Admitting: Internal Medicine

## 2012-02-23 MED ORDER — SPIRONOLACTONE-HCTZ 25-25 MG PO TABS: 2.0000 | ORAL_TABLET | Freq: Every day | ORAL | Status: DC

## 2012-02-23 NOTE — Telephone Encounter (Signed)
Ok to refill 

## 2012-02-23 NOTE — Telephone Encounter (Signed)
Addended by: Boone Master E on: 02/23/2012 04:13 PM   Modules accepted: Orders

## 2012-02-23 NOTE — Telephone Encounter (Signed)
See prev note

## 2012-02-23 NOTE — Telephone Encounter (Signed)
[facility]   SPIRONOLACTONE/HYDRO 20-25MG   TAKE 2 TABLETS DAILY  #60 x6 Allergies  Allergen Reactions  . Codeine Phosphate Other (See Comments)    GI Upset  . Aspirin Swelling and Rash  . Diphenhydramine Hcl Rash  . Penicillins Swelling    REACTION: unspecified   DR Sherene Sires IS THIS OK TO FILL THANK YOU

## 2012-02-23 NOTE — Telephone Encounter (Signed)
Last seen by MW 2.11.13, follow up in 3 months.  Refills sent to pharmacy.

## 2012-03-10 ENCOUNTER — Encounter: Payer: Self-pay | Admitting: Adult Health

## 2012-03-10 ENCOUNTER — Ambulatory Visit (INDEPENDENT_AMBULATORY_CARE_PROVIDER_SITE_OTHER): Payer: Medicare Other | Admitting: Adult Health

## 2012-03-10 ENCOUNTER — Telehealth: Payer: Self-pay | Admitting: Internal Medicine

## 2012-03-10 ENCOUNTER — Ambulatory Visit (INDEPENDENT_AMBULATORY_CARE_PROVIDER_SITE_OTHER)
Admission: RE | Admit: 2012-03-10 | Discharge: 2012-03-10 | Disposition: A | Discharge status: Home / Self Care | Payer: Medicare Other | Source: Ambulatory Visit | Attending: Adult Health | Admitting: Adult Health

## 2012-03-10 DIAGNOSIS — J45909 Unspecified asthma, uncomplicated: Secondary | ICD-10-CM

## 2012-03-10 DIAGNOSIS — R079 Chest pain, unspecified: Secondary | ICD-10-CM

## 2012-03-10 DIAGNOSIS — K219 Gastro-esophageal reflux disease without esophagitis: Secondary | ICD-10-CM | POA: Insufficient documentation

## 2012-03-10 NOTE — Progress Notes (Signed)
Subjective:    Patient ID: Cassie Campbell, female    DOB: 1941-04-24 .   MRN: [id number]   Brief patient profile:  52 yobf never smoker with severe chronic asthma and previous history to suggest marked atopy with IgE level in excess of 10,000 cw abpa. completed xolair 10/2006      HPI 07/28/2011 f/u ov/[name] cc cramping new onset x sev months a couple of times a week mostly daytime. occ dry cough p symbicort and qvar  rec  Try tonic water or quinine as needed for cramps > resolved   11/09/2011  [name]/ [name]  C/o rash under breasts waxes and wanes x months, better with nystatin,  cream but not resolved also aching  in both upper thighs since changed to generic lipitor, all tight feeling in am and better after activity/ stretching. No weakness rec Try off the lipitor to see if aches better and if so can call you in an non-generic form to try  Nystatin powder as needed for rash  Please remember to go to the lab and x-ray department downstairs for your tests  Follow med calendar  01/31/2012 f/u ov/[name] cc aches and pains min on or off lipitor so resumed rx, rash gone, no sob on symbicort titrated down to 1 bid and no cough  Sleeping ok without nocturnal  or early am exacerbation  of respiratory  c/o's or need for noct saba. Also denies any obvious fluctuation of symptoms with weather or environmental changes or other aggravating or alleviating factors except as outlined above    ROS  At present neg for  any significant sore throat, dysphagia, itching, sneezing,  nasal congestion or excess/ purulent secretions,  fever, chills, sweats, unintended wt loss, pleuritic or exertional cp, hempoptysis, orthopnea pnd or leg swelling.  Also denies presyncope, palpitations, heartburn, abdominal pain, nausea, vomiting, diarrhea  or change in bowel or urinary habits, dysuria,hematuria,  rash, arthralgias, visual complaints, headache, numbness weakness or ataxia.   03/10/2012 Acute  Complains of  tightness in chest, "lump" in the back of her throat, discomfort b/t shoulder blades x2days. Worse w/ lying down. Feels like indigestion , started after eating honeydew. No n/v/d.  No fever , exertional chest pain, radicular pain, jaw or arm pain. No associated sweats, diaphoresis or dyspnea. Breathing is good. Tightness has been constant. Took Alka seltzer without relief. No missed doses of prilosec. No known injury. No back or neck pain. Feels like when she lies down something comes up in her throat, unable to clear it with coughing . Then feels lump stuck in throat. Feels like if she could burp she would feel a lot better. No dysphagia. Feels mildly bloated w/ mild gas. No bloody stools or urinary symptoms. No palpitations, presyncope/syncope. No dizziness.  EKG in office today with no acute process. Reviewed in detail with comparison to 10/2011 EKG w/ Dr. Maple Hudson  . CXR today with no acute process. No wheezing or increased SABA use.  No known family hx of CAD. ? Heart trouble in father but unsure. No recent travel. No calf pain or swelling.     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)  OSTEOPENIA (ICD-733.90)  - DEXA 08/22/07 AP spine + 1.1, left femur -1.3, right femur -.8  - DEXA 10/06/09 + 1.6 left femur -1.6, right femur -.5  HYPERTENSION (ICD-401.9)  HYPERLIPIDEMIA (ICD-272.4)  - Target < 130 ldl pos fm hx, hbp  OBESITY  - Target wt = 178 for BMI <  30  ASTHMA (ICD-493.90) ([name] not active)  -Xolair s 8/05 > 11/07  -Mastered HFA December 30, 2008  ALLERGIC RHINITIS (ICD-477.9)  HEALTH MAINTENANCE...........................................................................Marland KitchenWert  - Td 09/2005  - Pneumovax 10/04 and October 27, 2009  - Colonscopy 10/28/2004  - CPX 11/09/2011   - GYN per [name]  MED CALENDAR 05/05/11         Objective:   Physical Exam  Wt 143 11/09/2011 > 01/31/2012 > 142 >>141 03/10/2012   Top set of dentures  HEENT: nl turbinates, and  orophanx. Top set of dentures  Nl external ear canals without cough reflex   NECK :  without JVD/Nodes/TM/ nl carotid upstrokes bilaterally, no bruits   LUNGS: no acc muscle use, clear to A and P bilaterally without cough on insp or exp maneuvers   CV:  RRR  no s3 , 1- II/VI sem no decrease in A2 or increase in P2, no edema   ABD:  soft and nontender with nl excursion in the supine position. No bruits or organomegaly, bowel sounds nl, no guarding or rebound   MS:  warm without deformities, calf tenderness, cyanosis or clubbing  Neuro: intact w/ no focal deficits noted.       03/10/2012 CXR w/ chronic RML atx , no acute process  EKG w/ NSR no acute process noted.      Assessment & Plan:

## 2012-03-10 NOTE — Patient Instructions (Signed)
Hold Prilosec  Begin Dexilant 60mg  Twice daily  Until samples are gone then restart Prilosec  Pepcid 20mg  At bedtime   GERD diet -strict.  Please contact office for sooner follow up if symptoms do not improve or worsen or seek emergency care  If not improving will need further evaluation -call our office  follow up Dr. Sherene Sires  In 4 weeks and As needed

## 2012-03-10 NOTE — Telephone Encounter (Signed)
Pt c/o chest tightness and dyspnea and is requesting an appt today. Pt will see TP today, MW is out of the office.

## 2012-03-10 NOTE — Assessment & Plan Note (Addendum)
?  GERD flare -workup for atypcial chest tightness w/ neg CXR , nml EKG w/ active symptoms suspcious for GERD flare on prilosec  Will treat aggressively for Reflux along rx and diet measures  Have advised pt that if not improving in next 24 hr or worsens she is to see ER attention.  follow up in office in 4 weeks for follow up , if not resolved in next 1 week, she is to call back for further evaluation and workup   Plan:  Hold Prilosec  Begin Dexilant 60mg  Twice daily  Until samples are gone then restart Prilosec  Pepcid 20mg  At bedtime   GERD diet -strict.  Please contact office for sooner follow up if symptoms do not improve or worsen or seek emergency care  If not improving will need further evaluation -call our office  follow up Dr. Sherene Sires  In 4 weeks and As needed

## 2012-03-20 ENCOUNTER — Other Ambulatory Visit: Payer: Self-pay | Admitting: Internal Medicine

## 2012-03-20 MED ORDER — ATORVASTATIN CALCIUM 20 MG PO TABS: ORAL_TABLET | ORAL | Status: DC

## 2012-04-07 ENCOUNTER — Other Ambulatory Visit: Payer: Self-pay | Admitting: Internal Medicine

## 2012-04-07 MED ORDER — AMLODIPINE BESYLATE 10 MG PO TABS: 10.0000 mg | ORAL_TABLET | Freq: Every day | ORAL | Status: DC

## 2012-04-12 ENCOUNTER — Encounter: Payer: Self-pay | Admitting: Internal Medicine

## 2012-04-12 ENCOUNTER — Ambulatory Visit (INDEPENDENT_AMBULATORY_CARE_PROVIDER_SITE_OTHER): Payer: Medicare Other | Admitting: Internal Medicine

## 2012-04-12 VITALS — BP 112/78 | HR 88 | Temp 98.1°F | Ht 60.0 in | Wt 140.6 lb

## 2012-04-12 DIAGNOSIS — E785 Hyperlipidemia, unspecified: Secondary | ICD-10-CM

## 2012-04-12 DIAGNOSIS — I10 Essential (primary) hypertension: Secondary | ICD-10-CM

## 2012-04-12 DIAGNOSIS — J45909 Unspecified asthma, uncomplicated: Secondary | ICD-10-CM

## 2012-04-12 MED ORDER — ATORVASTATIN CALCIUM 20 MG PO TABS: ORAL_TABLET | ORAL | Status: DC

## 2012-04-12 NOTE — Progress Notes (Signed)
Subjective:    Patient ID: Cassie Campbell, female    DOB: January 27, 1941 .   MRN: [id number]   Brief patient profile:  76 yobf never smoker with severe chronic asthma and previous history to suggest marked atopy with IgE level in excess of 10,000 cw abpa. completed xolair 10/2006      HPI 07/28/2011 f/u ov/Taelynn Mcelhannon cc cramping new onset x sev months a couple of times a week mostly daytime. occ dry cough p symbicort and qvar  rec  Try tonic water or quinine as needed for cramps > resolved   11/09/2011  Renny Remer/ [name]  C/o rash under breasts waxes and wanes x months, better with nystatin,  cream but not resolved also aching  in both upper thighs since changed to generic lipitor, all tight feeling in am and better after activity/ stretching. No weakness rec Try off the lipitor to see if aches better and if so can call you in an non-generic form to try  Nystatin powder as needed for rash  Please remember to go to the lab and x-ray department downstairs for your tests  Follow med calendar  01/31/2012 f/u ov/Sarenity Ramaker cc aches and pains min on or off lipitor so resumed rx, rash gone, no sob on symbicort titrated down to 1 bid and no cough  Sleeping ok without nocturnal  or early am exacerbation  of respiratory  c/o's or need for noct saba. Also denies any obvious fluctuation of symptoms with weather or environmental changes or other aggravating or alleviating factors except as outlined above    ROS  At present neg for  any significant sore throat, dysphagia, itching, sneezing,  nasal congestion or excess/ purulent secretions,  fever, chills, sweats, unintended wt loss, pleuritic or exertional cp, hempoptysis, orthopnea pnd or leg swelling.  Also denies presyncope, palpitations, heartburn, abdominal pain, nausea, vomiting, diarrhea  or change in bowel or urinary habits, dysuria,hematuria,  rash, arthralgias, visual complaints, headache, numbness weakness or ataxia.   03/10/2012 Acute  Complains of  tightness in chest, "lump" in the back of her throat, discomfort b/t shoulder blades x2days. Worse w/ lying down. Feels like indigestion , started after eating honeydew. No n/v/d.  No fever , exertional chest pain, radicular pain, jaw or arm pain. No associated sweats, diaphoresis or dyspnea. Breathing is good. Tightness has been constant. Took Alka seltzer without relief. No missed doses of prilosec. No known injury. No back or neck pain. Feels like when she lies down something comes up in her throat, unable to clear it with coughing . Then feels lump stuck in throat. Feels like if she could burp she would feel a lot better. No dysphagia. Feels mildly bloated w/ mild gas. No bloody stools or urinary symptoms. No palpitations, presyncope/syncope. No dizziness.  EKG in office today with no acute process. Reviewed in detail with comparison to 10/2011 EKG w/ Dr. Maple Hudson  . CXR today with no acute process. No wheezing or increased SABA use.  No known family hx of CAD. ? Heart trouble in father but unsure. No recent travel. No calf pain or swelling.  rec Hold Prilosec  Begin Dexilant 60mg  Twice daily  Until samples are gone then restart Prilosec  Pepcid 20mg  At bedtime   GERD diet -strict.   04/12/2012 f/u ov/Adrin Julian cc chest tightness resolved p above rx, minimal cough on just one symbicort each am with ability to increase to 2 bid but hasn't felt the need since last ov, no sob limiting desired activities,  no overt hb or sinus complaints or daytime saba need.  Sleeping ok without nocturnal  or early am exacerbation  of respiratory  c/o's or need for noct saba. Also denies any obvious fluctuation of symptoms with weather or environmental changes or other aggravating or alleviating factors except as outlined above   ROS  At present neg for  any significant sore throat, dysphagia, dental problems, itching, sneezing,  nasal congestion or excess/ purulent secretions, ear ache,   fever, chills, sweats, unintended wt  loss, pleuritic or exertional cp, hemoptysis, palpitations, orthopnea pnd or leg swelling.  Also denies presyncope, palpitations, heartburn, abdominal pain, anorexia, nausea, vomiting, diarrhea  or change in bowel or urinary habits, change in stools or urine, dysuria,hematuria,  rash, arthralgias, visual complaints, headache, numbness weakness or ataxia or problems with walking or coordination. No noted change in mood/affect or memory.                     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)  OSTEOPENIA (ICD-733.90)  - DEXA 08/22/07 AP spine + 1.1, left femur -1.3, right femur -.8  - DEXA 10/06/09 + 1.6 left femur -1.6, right femur -.5  HYPERTENSION (ICD-401.9)  HYPERLIPIDEMIA (ICD-272.4)  - Target < 130 ldl pos fm hx, hbp  OBESITY  - Target wt = 178 for BMI < 30  ASTHMA (ICD-493.90) ([name] not active)  -Xolair s 8/05 > 11/07  -Mastered HFA December 30, 2008  ALLERGIC RHINITIS (ICD-477.9)  HEALTH MAINTENANCE...........................................................................Marland KitchenWert  - Td 09/2005  - Pneumovax 10/04 and October 27, 2009  - Colonscopy 10/28/2004  - CPX 11/09/2011   - GYN per [name]  MED CALENDAR 05/05/11         Objective:   Physical Exam  Wt 143 11/09/2011 > 01/31/2012 > 142 >>141 03/10/2012 > 04/12/2012  140  Top set of dentures  HEENT: nl turbinates, and orophanx. Top set of dentures  Nl external ear canals without cough reflex   NECK :  without JVD/Nodes/TM/ nl carotid upstrokes bilaterally, no bruits   LUNGS: no acc muscle use, clear to A and P bilaterally without cough on insp or exp maneuvers   CV:  RRR  no s3 , 1- II/VI sem no decrease in A2 or increase in P2, no edema   ABD:  soft and nontender with nl excursion in the supine position. No bruits or organomegaly, bowel sounds nl, no guarding or rebound          03/10/2012 CXR w/ chronic RML atx , no acute process  EKG w/ NSR no acute process noted.        Assessment & Plan:

## 2012-04-12 NOTE — Patient Instructions (Signed)

## 2012-04-13 NOTE — Assessment & Plan Note (Signed)
-  Xolair s 8/05 > 11/07  -Mastered HFA December 30, 2008

## 2012-04-13 NOTE — Assessment & Plan Note (Signed)
Adequate control on present rx, reviewed  

## 2012-06-18 ENCOUNTER — Other Ambulatory Visit: Payer: Self-pay | Admitting: Internal Medicine

## 2012-06-26 ENCOUNTER — Encounter: Payer: Self-pay | Admitting: Internal Medicine

## 2012-06-26 ENCOUNTER — Ambulatory Visit (INDEPENDENT_AMBULATORY_CARE_PROVIDER_SITE_OTHER): Payer: Medicare Other | Admitting: Internal Medicine

## 2012-06-26 VITALS — BP 122/80 | HR 74 | Temp 97.7°F | Ht 59.0 in | Wt 139.8 lb

## 2012-06-26 DIAGNOSIS — J45909 Unspecified asthma, uncomplicated: Secondary | ICD-10-CM

## 2012-06-26 NOTE — Assessment & Plan Note (Signed)
All goals of chronic asthma control met including optimal function and elimination of symptoms with minimal need for rescue therapy.  Contingencies discussed in full including contacting this office immediately if not controlling the symptoms using the rule of two's.       Each maintenance medication was reviewed in detail including most importantly the difference between maintenance and as needed and under what circumstances the prns are to be used. This was done in the context of a medication calendar review which provided the patient with a user-friendly unambiguous mechanism for medication administration and reconciliation and provides an action plan for all active problems. It is critical that this be shown to every doctor  for modification during the office visit if necessary so the patient can use it as a working document.     

## 2012-06-26 NOTE — Progress Notes (Signed)
Subjective:    Patient ID: Cassie Campbell, female    DOB: January 27, 1941 .   MRN: [id number]   Brief patient profile:  76 yobf never smoker with severe chronic asthma and previous history to suggest marked atopy with IgE level in excess of 10,000 cw abpa. completed xolair 10/2006      HPI 07/28/2011 f/u ov/Thales Knipple cc cramping new onset x sev months a couple of times a week mostly daytime. occ dry cough p symbicort and qvar  rec  Try tonic water or quinine as needed for cramps > resolved   11/09/2011  Hersh Minney/ [name]  C/o rash under breasts waxes and wanes x months, better with nystatin,  cream but not resolved also aching  in both upper thighs since changed to generic lipitor, all tight feeling in am and better after activity/ stretching. No weakness rec Try off the lipitor to see if aches better and if so can call you in an non-generic form to try  Nystatin powder as needed for rash  Please remember to go to the lab and x-ray department downstairs for your tests  Follow med calendar  01/31/2012 f/u ov/Mirelle Biskup cc aches and pains min on or off lipitor so resumed rx, rash gone, no sob on symbicort titrated down to 1 bid and no cough  Sleeping ok without nocturnal  or early am exacerbation  of respiratory  c/o's or need for noct saba. Also denies any obvious fluctuation of symptoms with weather or environmental changes or other aggravating or alleviating factors except as outlined above    ROS  At present neg for  any significant sore throat, dysphagia, itching, sneezing,  nasal congestion or excess/ purulent secretions,  fever, chills, sweats, unintended wt loss, pleuritic or exertional cp, hempoptysis, orthopnea pnd or leg swelling.  Also denies presyncope, palpitations, heartburn, abdominal pain, nausea, vomiting, diarrhea  or change in bowel or urinary habits, dysuria,hematuria,  rash, arthralgias, visual complaints, headache, numbness weakness or ataxia.   03/10/2012 Acute  Complains of  tightness in chest, "lump" in the back of her throat, discomfort b/t shoulder blades x2days. Worse w/ lying down. Feels like indigestion , started after eating honeydew. No n/v/d.  No fever , exertional chest pain, radicular pain, jaw or arm pain. No associated sweats, diaphoresis or dyspnea. Breathing is good. Tightness has been constant. Took Alka seltzer without relief. No missed doses of prilosec. No known injury. No back or neck pain. Feels like when she lies down something comes up in her throat, unable to clear it with coughing . Then feels lump stuck in throat. Feels like if she could burp she would feel a lot better. No dysphagia. Feels mildly bloated w/ mild gas. No bloody stools or urinary symptoms. No palpitations, presyncope/syncope. No dizziness.  EKG in office today with no acute process. Reviewed in detail with comparison to 10/2011 EKG w/ Dr. Maple Hudson  . CXR today with no acute process. No wheezing or increased SABA use.  No known family hx of CAD. ? Heart trouble in father but unsure. No recent travel. No calf pain or swelling.  rec Hold Prilosec  Begin Dexilant 60mg  Twice daily  Until samples are gone then restart Prilosec  Pepcid 20mg  At bedtime   GERD diet -strict.   04/12/2012 f/u ov/Matrice Herro cc chest tightness resolved p above rx, minimal cough on just one symbicort each am with ability to increase to 2 bid but hasn't felt the need since last ov, no sob limiting desired activities,  no overt hb or sinus complaints or daytime saba need. rec No change rx See calendar for specific medication instructions  06/26/2012 f/u ov/Andrick Rust cc occcough with occassional clear mucus, occasional itchy sneezy but no need for daytime saba. Continues min use of symbicort on max dose qvar.  No obvious daytime variabilty or assoc   cp or chest tightness, subjective wheeze overt sinus or hb symptoms. No unusual exp hx    Sleeping ok without nocturnal  or early am exacerbation  of respiratory  c/o's or need for  noct saba. Also denies any obvious fluctuation of symptoms with weather or environmental changes or other aggravating or alleviating factors except as outlined above   ROS  At present neg for  any significant sore throat, dysphagia, dental problems, itching, sneezing,  nasal congestion or excess/ purulent secretions, ear ache,   fever, chills, sweats, unintended wt loss, pleuritic or exertional cp, hemoptysis, palpitations, orthopnea pnd or leg swelling.  Also denies presyncope, palpitations, heartburn, abdominal pain, anorexia, nausea, vomiting, diarrhea  or change in bowel or urinary habits, change in stools or urine, dysuria,hematuria,  rash, arthralgias, visual complaints, headache, numbness weakness or ataxia or problems with walking or coordination. No noted change in mood/affect or memory.                     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)  OSTEOPENIA (ICD-733.90)  - DEXA 08/22/07 AP spine + 1.1, left femur -1.3, right femur -.8  - DEXA 10/06/09 + 1.6 left femur -1.6, right femur -.5  HYPERTENSION (ICD-401.9)  HYPERLIPIDEMIA (ICD-272.4)  - Target < 130 ldl pos fm hx, hbp  OBESITY  - Target wt = 178 for BMI < 30  ASTHMA (ICD-493.90) ([name] not active)  -Xolair s 8/05 > 11/07  -Mastered HFA December 30, 2008  ALLERGIC RHINITIS (ICD-477.9)  HEALTH MAINTENANCE...........................................................................Marland KitchenWert  - Td 09/2005  - Pneumovax 10/04 and October 27, 2009  - Colonscopy 10/28/2004  - CPX 11/09/2011   - GYN per [name]  MED CALENDAR 05/05/11         Objective:   Physical Exam  Wt 143 11/09/2011 > 01/31/2012 > 142 >>141 03/10/2012 > 04/12/2012  140 > 06/26/2012  139  Top set of dentures  HEENT: nl turbinates, and orophanx. Top set of dentures  Nl external ear canals without cough reflex   NECK :  without JVD/Nodes/TM/ nl carotid upstrokes bilaterally, no bruits   LUNGS: no acc muscle use, clear to A and P  bilaterally without cough on insp or exp maneuvers   CV:  RRR  no s3 , 1- II/VI sem no decrease in A2 or increase in P2, no edema   ABD:  soft and nontender with nl excursion in the supine position. No bruits or organomegaly, bowel sounds nl, no guarding or rebound          03/10/2012 CXR w/ chronic RML atx , no acute process  EKG w/ NSR no acute process noted.      Assessment & Plan:

## 2012-06-26 NOTE — Patient Instructions (Addendum)

## 2012-07-24 ENCOUNTER — Other Ambulatory Visit: Payer: Self-pay | Admitting: Internal Medicine

## 2012-09-18 ENCOUNTER — Other Ambulatory Visit: Payer: Self-pay | Admitting: Internal Medicine

## 2012-10-03 ENCOUNTER — Other Ambulatory Visit: Payer: Self-pay | Admitting: Internal Medicine

## 2012-10-20 ENCOUNTER — Other Ambulatory Visit: Payer: Self-pay | Admitting: Internal Medicine

## 2012-10-23 ENCOUNTER — Encounter: Payer: Self-pay | Admitting: Internal Medicine

## 2012-11-09 ENCOUNTER — Encounter: Payer: Medicare Other | Admitting: Internal Medicine

## 2012-11-13 ENCOUNTER — Ambulatory Visit (INDEPENDENT_AMBULATORY_CARE_PROVIDER_SITE_OTHER): Payer: Medicare Other | Admitting: Internal Medicine

## 2012-11-13 ENCOUNTER — Encounter: Payer: Self-pay | Admitting: Internal Medicine

## 2012-11-13 ENCOUNTER — Other Ambulatory Visit (INDEPENDENT_AMBULATORY_CARE_PROVIDER_SITE_OTHER): Payer: Medicare Other

## 2012-11-13 ENCOUNTER — Ambulatory Visit (INDEPENDENT_AMBULATORY_CARE_PROVIDER_SITE_OTHER)
Admission: RE | Admit: 2012-11-13 | Discharge: 2012-11-13 | Disposition: A | Discharge status: Home / Self Care | Payer: Medicare Other | Source: Ambulatory Visit | Attending: Internal Medicine | Admitting: Internal Medicine

## 2012-11-13 VITALS — BP 130/84 | HR 93 | Temp 98.1°F | Ht 59.0 in | Wt 135.6 lb

## 2012-11-13 DIAGNOSIS — E785 Hyperlipidemia, unspecified: Secondary | ICD-10-CM

## 2012-11-13 DIAGNOSIS — I10 Essential (primary) hypertension: Secondary | ICD-10-CM

## 2012-11-13 DIAGNOSIS — R930 Abnormal findings on diagnostic imaging of skull and head, not elsewhere classified: Secondary | ICD-10-CM

## 2012-11-13 DIAGNOSIS — J309 Allergic rhinitis, unspecified: Secondary | ICD-10-CM

## 2012-11-13 DIAGNOSIS — Z23 Encounter for immunization: Secondary | ICD-10-CM

## 2012-11-13 DIAGNOSIS — J45909 Unspecified asthma, uncomplicated: Secondary | ICD-10-CM

## 2012-11-13 LAB — CBC WITH DIFFERENTIAL/PLATELET
Eosinophils Absolute: 0.1 10*3/uL (ref 0.0–0.7)
Eosinophils Relative: 1.7 % (ref 0.0–5.0)
HCT: 44.7 % (ref 36.0–46.0)
Lymphs Abs: 1.2 10*3/uL (ref 0.7–4.0)
MCHC: 34 g/dL (ref 30.0–36.0)
MCV: 89 fl (ref 78.0–100.0)
Monocytes Absolute: 0.6 10*3/uL (ref 0.1–1.0)
Neutrophils Relative %: 75.2 % (ref 43.0–77.0)
Platelets: 473 10*3/uL — ABNORMAL HIGH (ref 150.0–400.0)
RDW: 14.3 % (ref 11.5–14.6)
WBC: 7.8 10*3/uL (ref 4.5–10.5)

## 2012-11-13 LAB — LIPID PANEL
Cholesterol: 184 mg/dL (ref 0–200)
HDL: 45.4 mg/dL (ref >39.00)
Total CHOL/HDL Ratio: 4
Triglycerides: 107 mg/dL (ref 0.0–149.0)

## 2012-11-13 LAB — BASIC METABOLIC PANEL
BUN: 13 mg/dL (ref 6–23)
CO2: 27 mEq/L (ref 19–32)
Calcium: 10 mg/dL (ref 8.4–10.5)
Chloride: 97 mEq/L (ref 96–112)
Creatinine, Ser: 0.7 mg/dL (ref 0.4–1.2)
Glucose, Bld: 117 mg/dL — ABNORMAL HIGH (ref 70–99)

## 2012-11-13 LAB — HEPATIC FUNCTION PANEL
Albumin: 4.2 g/dL (ref 3.5–5.2)
Alkaline Phosphatase: 72 U/L (ref 39–117)
Total Protein: 8.6 g/dL — ABNORMAL HIGH (ref 6.0–8.3)

## 2012-11-13 LAB — URINALYSIS
Bilirubin Urine: NEGATIVE
Hgb urine dipstick: NEGATIVE
Ketones, ur: NEGATIVE
Total Protein, Urine: NEGATIVE
Urine Glucose: NEGATIVE
Urobilinogen, UA: 0.2 (ref 0.0–1.0)

## 2012-11-13 MED ORDER — AMLODIPINE BESYLATE 10 MG PO TABS: ORAL_TABLET | ORAL | Status: DC

## 2012-11-13 NOTE — Progress Notes (Signed)
Subjective:    Patient ID: Cassie Campbell, female    DOB: 10-06-1941    MRN: [id number]   Brief patient profile:  39 yobf never smoker with severe chronic asthma and previous history to suggest marked atopy with IgE level in excess of 10,000 c/w abpa  completed xolair 10/2006      HPI 07/28/2011 f/u ov/Cassie Campbell cc cramping new onset x sev months a couple of times a week mostly daytime. occ dry cough p symbicort and qvar  rec  Try tonic water or quinine as needed for cramps > resolved  11/09/2011  Cassie Campbell/ [name]  C/o rash under breasts waxes and wanes x months, better with nystatin,  cream but not resolved also aching  in both upper thighs since changed to generic lipitor, all tight feeling in am and better after activity/ stretching. No weakness rec Try off the lipitor to see if aches better and if so can call you in an non-generic form to try Nystatin powder as needed for rash Follow med calendar     11/13/2012 f/u ov/Cassie Campbell comprehensive yearly eval A) Asthma with rml atx, rarely needs to use symbicort more than once daily on qvar 80 2bid  B) HBP C) Hyperlipidemia D) chronic  rhinitis  No cp, cough, limiting sob.no need for any rescue saba at all   No obvious daytime variabilty or assoc cp or chest tightness, subjective wheeze overt sinus or hb symptoms. No unusual exp hx .   Sleeping ok without nocturnal  or early am exacerbation  of respiratory  c/o's or need for noct saba. Also denies any obvious fluctuation of symptoms with weather or environmental changes or other aggravating or alleviating factors except as outlined above   ROS  The following are not active complaints unless bolded sore throat, dysphagia, dental problems, itching, sneezing,  nasal congestion or excess/ purulent secretions, ear ache,   fever, chills, sweats, unintended wt loss, pleuritic or exertional cp, hemoptysis,  orthopnea pnd or leg swelling, presyncope, palpitations, heartburn, abdominal pain, anorexia,  nausea, vomiting, diarrhea  or change in bowel or urinary habits, change in stools or urine, dysuria,hematuria,  rash, arthralgias, visual complaints, headache, numbness weakness or ataxia or problems with walking or coordination,  change in mood/affect or memory.      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)  OSTEOPENIA (ICD-733.90)  - DEXA 08/22/07 AP spine + 1.1, left femur -1.3, right femur -.8  - DEXA 10/06/09 + 1.6 left femur -1.6, right femur -.5  HYPERTENSION (ICD-401.9)  HYPERLIPIDEMIA (ICD-272.4)  - Target < 130 ldl pos fm hx, hbp  OBESITY  - Target wt = 178 for BMI < 30  ASTHMA (ICD-493.90) ([name] not active)  -Xolair s 8/05 > 10/2006  -Mastered HFA December 30, 2008  ALLERGIC RHINITIS (ICD-477.9)  S/p cervical fusion 2000 ............................................................................ [name] HEALTH MAINTENANCE...........................................................................Marland KitchenWert  - Td 09/2005  - Pneumovax 10/04 and October 27, 2009  - Colonscopy 10/28/2004  - CPX 11/13/2012    - GYN per [name]  MED CALENDAR 05/05/11         Objective:   Physical Exam  Wt 143 11/09/2011 >  04/12/2012  140 > 06/26/2012  139 >  11/13/2012 135  Pleasant amb bf nad  HEENT: top dentures, nl turbinates, and orophanx. Nl external ear canals without cough reflex   NECK :  without JVD/Nodes/TM/ nl carotid upstrokes bilaterally   LUNGS: no acc muscle use, clear to A and P bilaterally without cough on insp  or exp maneuvers   CV:  RRR  no s3 or murmur or increase in P2, no edema   ABD:  soft and nontender with nl excursion in the supine position. No bruits or organomegaly, bowel sounds nl  MS:  warm without deformities, calf tenderness, cyanosis or clubbing  SKIN: warm and dry without lesions    NEURO:  alert, approp, no deficits on motor/cerbellar testing, nl reflexes              CXR  11/13/2012 :  No acute disease. Chronic changes  at the right lung base.         Assessment & Plan:

## 2012-11-13 NOTE — Assessment & Plan Note (Signed)
HYPERLIPIDEMIA (ICD-272.4)  - Target < 130 ldl pos fm hx, hb  Adequate control on present rx, reviewed

## 2012-11-13 NOTE — Assessment & Plan Note (Signed)
Adequate control on present rx, reviewed  

## 2012-11-13 NOTE — Progress Notes (Signed)
Quick Note:  Spoke with pt and notified of results per Dr. [name]. Pt verbalized understanding and denied any questions.  ______ 

## 2012-11-13 NOTE — Assessment & Plan Note (Signed)
ASTHMA (ICD-493.90) ([name] not active)  -Xolair s 8/05 > 11/07  -Mastered HFA December 30, 2008   All goals of chronic asthma control met including optimal function and elimination of symptoms with minimal need for rescue therapy.  Contingencies discussed in full including contacting this office immediately if not controlling the symptoms using the rule of two's.

## 2012-11-13 NOTE — Patient Instructions (Addendum)
Please remember to go to the lab and x-ray department downstairs for your tests - we will call you with the results when they are available.     See calendar for specific medication instructions and bring it back for each and every office visit for every healthcare provider you see.  Without it,  you may not receive the best quality medical care that we feel you deserve.  You will note that the calendar groups together  your maintenance  medications that are timed at particular times of the day.  Think of this as your checklist for what your doctor has instructed you to do until your next evaluation to see what benefit  there is  to staying on a consistent group of medications intended to keep you well.  The other group at the bottom is entirely up to you to use as you see fit  for specific symptoms that may arise between visits that require you to treat them on an as needed basis.  Think of this as your action plan or "what if" list.   Separating the top medications from the bottom group is fundamental to providing you adequate care going forward.    Please schedule a follow up visit in 3 months but call sooner if needed  

## 2012-12-04 ENCOUNTER — Other Ambulatory Visit: Payer: Self-pay | Admitting: Internal Medicine

## 2012-12-21 ENCOUNTER — Other Ambulatory Visit: Payer: Self-pay | Admitting: Internal Medicine

## 2013-01-01 ENCOUNTER — Other Ambulatory Visit: Payer: Self-pay | Admitting: Internal Medicine

## 2013-01-16 ENCOUNTER — Other Ambulatory Visit: Payer: Self-pay | Admitting: Internal Medicine

## 2013-02-12 ENCOUNTER — Telehealth: Payer: Self-pay | Admitting: Internal Medicine

## 2013-02-12 ENCOUNTER — Encounter: Payer: Self-pay | Admitting: Internal Medicine

## 2013-02-12 ENCOUNTER — Ambulatory Visit (INDEPENDENT_AMBULATORY_CARE_PROVIDER_SITE_OTHER): Payer: Medicare Other | Admitting: Internal Medicine

## 2013-02-12 VITALS — BP 138/80 | HR 93 | Temp 98.0°F | Ht 60.0 in | Wt 137.0 lb

## 2013-02-12 DIAGNOSIS — M25559 Pain in unspecified hip: Secondary | ICD-10-CM

## 2013-02-12 DIAGNOSIS — M25551 Pain in right hip: Secondary | ICD-10-CM

## 2013-02-12 DIAGNOSIS — M899 Disorder of bone, unspecified: Secondary | ICD-10-CM

## 2013-02-12 DIAGNOSIS — I10 Essential (primary) hypertension: Secondary | ICD-10-CM

## 2013-02-12 DIAGNOSIS — M949 Disorder of cartilage, unspecified: Secondary | ICD-10-CM

## 2013-02-12 DIAGNOSIS — J45909 Unspecified asthma, uncomplicated: Secondary | ICD-10-CM

## 2013-02-12 MED ORDER — TRAMADOL HCL 50 MG PO TABS: ORAL_TABLET | ORAL | Status: DC

## 2013-02-12 NOTE — Telephone Encounter (Signed)
Pt called back and says she no longer needed to speak to [facility] re: appt for bone density. She says she will keep this appt. Hazel Sams

## 2013-02-12 NOTE — Assessment & Plan Note (Signed)
Adequate control on present rx, reviewed  

## 2013-02-12 NOTE — Progress Notes (Signed)
Subjective:    Patient ID: Cassie Campbell, female    DOB: Feb 04, 1941    MRN: [id number]   Brief patient profile:  38 yobf never smoker with severe chronic asthma and previous history to suggest marked atopy with IgE level in excess of 10,000 c/w abpa  completed xolair 10/2006      HPI 07/28/2011 f/u Cassie Campbell cc cramping new onset x sev months a couple of times a week mostly daytime. occ dry cough p symbicort and qvar  rec  Try tonic water or quinine as needed for cramps > resolved  11/09/2011  Elexis Pollak/ [name]  C/o rash under breasts waxes and wanes x months, better with nystatin,  cream but not resolved also aching  in both upper thighs since changed to generic lipitor, all tight feeling in am and better after activity/ stretching. No weakness rec Try off the lipitor to see if aches better and if so can call you in an non-generic form to try Nystatin powder as needed for rash Follow med calendar     11/13/2012 f/u ov/Cassie Campbell comprehensive yearly eval A) Asthma with rml atx, rarely needs to use symbicort more than once daily on qvar 80 2bid  B) HBP C) Hyperlipidemia D) chronic  Rhinitis  rec No change rx Continue med calendar   02/12/2013 f/u ov/Cassie Campbell Radi cc abrupt R post thigh pain 2/18 worse with wt bearing no h/o injury and no better with tylenol,  Better if stays off it, immediately worse with wt bearing with pain radiating down to mid to lower thigh from post hip.  No numbness/ weakness.   No cp, cough, limiting sob.no need for any rescue saba at all   No obvious daytime variabilty or assoc cp or chest tightness, subjective wheeze overt sinus or hb symptoms. No unusual exp hx .   Sleeping ok without nocturnal  or early am exacerbation  of respiratory  c/o's or need for noct saba. Also denies any obvious fluctuation of symptoms with weather or environmental changes or other aggravating or alleviating factors except as outlined above   ROS  The following are not active complaints  unless bolded sore throat, dysphagia, dental problems, itching, sneezing,  nasal congestion or excess/ purulent secretions, ear ache,   fever, chills, sweats, unintended wt loss, pleuritic or exertional cp, hemoptysis,  orthopnea pnd or leg swelling, presyncope, palpitations, heartburn, abdominal pain, anorexia, nausea, vomiting, diarrhea  or change in bowel or urinary habits, change in stools or urine, dysuria,hematuria,  rash, arthralgias, visual complaints, headache, numbness weakness or ataxia or problems with walking or coordination,  change in mood/affect or memory.      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)  OSTEOPENIA (ICD-733.90)  - DEXA 08/22/07 AP spine + 1.1, left femur -1.3, right femur -.8  - DEXA 10/06/09 + 1.6 left femur -1.6, right femur -.5  HYPERTENSION (ICD-401.9)  HYPERLIPIDEMIA (ICD-272.4)  - Target < 130 ldl pos fm hx, hbp  OBESITY  - Target wt = 178 for BMI < 30  ASTHMA (ICD-493.90) ([name] not active)  -Xolair s 8/05 > 10/2006  -Mastered HFA December 30, 2008  ALLERGIC RHINITIS (ICD-477.9)  S/p cervical fusion 2000 ............................................................................ [name] HEALTH MAINTENANCE...........................................................................Marland KitchenWert  - Td 09/2005  - Pneumovax 10/04 and October 27, 2009  - Colonscopy 10/28/2004  - CPX 11/13/2012    - GYN per [name]  MED CALENDAR 05/05/11         Objective:   Physical Exam  Wt 143 11/09/2011 >  04/12/2012  140 > 06/26/2012  139 >  11/13/2012 135 >  137 02/12/2013   Pleasant amb bf nad  HEENT: top dentures, nl turbinates, and orophanx. Nl external ear canals without cough reflex   NECK :  without JVD/Nodes/TM/ nl carotid upstrokes bilaterally   LUNGS: no acc muscle use, clear to A and P bilaterally without cough on insp or exp maneuvers   CV:  RRR  no s3 or murmur or increase in P2, no edema   ABD:  soft and nontender with nl excursion  in the supine position. No bruits or organomegaly, bowel sounds nl  MS:  warm without deformities, calf tenderness, cyanosis or clubbing  SKIN: warm and dry without lesions    NEURO:  alert, approp, no deficits on motor/cerbellar testing, nl reflexes              CXR  11/13/2012 :  No acute disease. Chronic changes at the right lung base.         Assessment & Plan:

## 2013-02-12 NOTE — Patient Instructions (Addendum)
See calendar for specific medication instructions and bring it back for each and every office visit for every healthcare provider you see.  Without it,  you may not receive the best quality medical care that we feel you deserve.  You will note that the calendar groups together  your maintenance  medications that are timed at particular times of the day.  Think of this as your checklist for what your doctor has instructed you to do until your next evaluation to see what benefit  there is  to staying on a consistent group of medications intended to keep you well.  The other group at the bottom is entirely up to you to use as you see fit  for specific symptoms that may arise between visits that require you to treat them on an as needed basis.  Think of this as your action plan or "what if" list.   Separating the top medications from the bottom group is fundamental to providing you adequate care going forward.    Please schedule a follow up visit in 3 months but call sooner if needed to see Tammy with all medications for new med calendar  Hold lipitor for at least a couple of weeks  Take tramadol for pain  Please see patient coordinator before you leave today  to schedule bone density and referral to [facility]

## 2013-02-13 NOTE — Assessment & Plan Note (Signed)
Unusual pattern ? Referred hip pain > asa allergic so rx tramadol and refer to ortho

## 2013-02-13 NOTE — Assessment & Plan Note (Signed)
ASTHMA (ICD-493.90) ([name] not active)  -Xolair s 8/05 > 11/07  -Mastered HFA December 30, 2008   All goals of chronic asthma control met including optimal function and elimination of symptoms with minimal need for rescue therapy.  Contingencies discussed in full including contacting this office immediately if not controlling the symptoms using the rule of two's.       Each maintenance medication was reviewed in detail including most importantly the difference between maintenance and as needed and under what circumstances the prns are to be used. This was done in the context of a medication calendar review which provided the patient with a user-friendly unambiguous mechanism for medication administration and reconciliation and provides an action plan for all active problems. It is critical that this be shown to every doctor  for modification during the office visit if necessary so the patient can use it as a working document.

## 2013-02-15 ENCOUNTER — Ambulatory Visit (INDEPENDENT_AMBULATORY_CARE_PROVIDER_SITE_OTHER)
Admission: RE | Admit: 2013-02-15 | Discharge: 2013-02-15 | Disposition: A | Discharge status: Home / Self Care | Payer: Medicare Other | Source: Ambulatory Visit | Attending: Internal Medicine | Admitting: Internal Medicine

## 2013-02-15 DIAGNOSIS — M949 Disorder of cartilage, unspecified: Secondary | ICD-10-CM

## 2013-02-16 ENCOUNTER — Encounter: Payer: Self-pay | Admitting: Internal Medicine

## 2013-02-19 NOTE — Progress Notes (Signed)
Quick Note:  Spoke with pt and notified of results per Dr. [name]. Pt verbalized understanding and denied any questions.  ______ 

## 2013-04-03 ENCOUNTER — Other Ambulatory Visit: Payer: Self-pay | Admitting: Internal Medicine

## 2013-04-27 ENCOUNTER — Other Ambulatory Visit: Payer: Self-pay | Admitting: Internal Medicine

## 2013-05-16 ENCOUNTER — Ambulatory Visit (INDEPENDENT_AMBULATORY_CARE_PROVIDER_SITE_OTHER): Payer: Medicare Other | Admitting: Adult Health

## 2013-05-16 ENCOUNTER — Encounter: Payer: Self-pay | Admitting: Adult Health

## 2013-05-16 VITALS — BP 120/82 | HR 92 | Temp 98.1°F | Ht 59.0 in | Wt 141.2 lb

## 2013-05-16 DIAGNOSIS — J45909 Unspecified asthma, uncomplicated: Secondary | ICD-10-CM

## 2013-05-16 DIAGNOSIS — M25559 Pain in unspecified hip: Secondary | ICD-10-CM

## 2013-05-16 DIAGNOSIS — I10 Essential (primary) hypertension: Secondary | ICD-10-CM

## 2013-05-16 DIAGNOSIS — M25551 Pain in right hip: Secondary | ICD-10-CM

## 2013-05-16 NOTE — Assessment & Plan Note (Signed)
Cont w/ ortho follow up  Advised on water exercises

## 2013-05-16 NOTE — Progress Notes (Signed)
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 occ dry cough p symbicort and qvar  rec  Try tonic water or quinine as needed for cramps > resolved  11/09/2011  [name]/ [name]  C/o rash under breasts waxes and wanes x months, better with nystatin,  cream but not resolved also aching  in both upper thighs since changed to generic lipitor, all tight feeling in am and better after activity/ stretching. No weakness rec Try off the lipitor to see if aches better and if so can call you in an non-generic form to try Nystatin powder as needed for rash Follow med calendar     11/13/2012 f/u ov/[name] comprehensive yearly eval A) Asthma with rml atx, rarely needs to use symbicort more than once daily on qvar 80 2bid  B) HBP C) Hyperlipidemia D) chronic  Rhinitis  rec No change rx Continue med calendar   02/12/2013 f/u ov/[name] cc abrupt R post thigh pain 2/18 worse with wt bearing no h/o injury and no better with tylenol,  Better if stays off it, immediately worse with wt bearing with pain radiating down to mid to lower thigh from post hip.  No numbness/ weakness.   05/16/2013 Follow up and Med review  Returns for 3 month follow up and med review.  Last visit with right hip pain, no better off statin rx.  Seen by ortho, advised DJD, tx w/ steroid injection with limited benefit. Hip better w/ rest , worse with excessive walking /exercise. Has follow up with ortho today.  We discussed water exercise programs.   She is doing well with asthma. No SABA use. No steroid use in > 12yrs. No ER /hospital admits.    We reviewed all her meds and organized them into a med  calendar with pt education.  Appears she is taking meds correctly.    ROS Constitutional:   No  weight loss, night sweats,  Fevers, chills, fatigue, or  lassitude.  HEENT:   No headaches,  Difficulty swallowing,  Tooth/dental problems, or  Sore throat,                No sneezing, itching, ear ache, nasal congestion, post nasal drip,   CV:  No chest pain,  Orthopnea, PND, swelling in lower extremities, anasarca, dizziness, palpitations, syncope.   GI  No heartburn, indigestion, abdominal pain, nausea, vomiting, diarrhea, change in bowel habits, loss of appetite, bloody stools.   Resp: No shortness of breath with exertion or at rest.  No excess mucus, no productive cough,  No non-productive cough,  No coughing up of blood.  No change in color of mucus.  No wheezing.  No chest wall deformity  Skin: no rash or lesions.  GU: no dysuria, change in color of urine, no urgency or frequency.  No flank pain, no hematuria   MS:  +right hip pain  Psych:  No change in mood or affect. No depression or anxiety.  No memory loss.          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 [name] HEALTH MAINTENANCE...........................................................................Marland KitchenWert  - Td 09/2005  - Pneumovax 10/04 and October 27, 2009  - Colonscopy 10/28/2004  - CPX 11/13/2012    - GYN per [name]  -Mammogram 2013  -DEXA 2014  MED CALENDAR 05/05/11 , 05/16/2013          Objective:   Physical Exam  Wt 143 11/09/2011 >  04/12/2012  140 > 06/26/2012  139 >  11/13/2012 135 >  137 02/12/2013 >141 05/16/2013   Pleasant amb bf nad  HEENT: top dentures, nl turbinates, and orophanx. Nl external ear canals without cough reflex   NECK :  without JVD/Nodes/TM/ nl carotid upstrokes bilaterally   LUNGS: no acc muscle use, clear to A and P bilaterally without cough on insp or exp maneuvers   CV:  RRR  no s3 or murmur or increase in P2, no edema   ABD:  soft and nontender with nl excursion in the supine position. No bruits or organomegaly, bowel sounds nl  MS:  warm without deformities, calf tenderness, cyanosis or clubbing  SKIN: warm and dry without lesions    NEURO:  alert, approp, no deficits on motor/cerbellar testing, nl reflexes      CXR  11/13/2012 :  No acute disease. Chronic changes at the right lung base.         Assessment & Plan:

## 2013-05-16 NOTE — Patient Instructions (Addendum)
Continue on current regimen-keep up good work  Follow med calendar closely and bring to each visit.  Look into silver sneakers program at local gym for water aerobics/exercise.  Follow up Dr. Sherene Sires  In 3 months and As needed

## 2013-05-16 NOTE — Assessment & Plan Note (Signed)
Controlled on rx  Patient's medications were reviewed today and patient education was given. Computerized medication calendar was adjusted/completed    

## 2013-05-16 NOTE — Assessment & Plan Note (Signed)
Controlled on rx   

## 2013-05-21 ENCOUNTER — Other Ambulatory Visit: Payer: Self-pay | Admitting: Internal Medicine

## 2013-05-23 ENCOUNTER — Encounter (HOSPITAL_COMMUNITY): Payer: Self-pay | Admitting: *Deleted

## 2013-05-23 ENCOUNTER — Emergency Department (HOSPITAL_COMMUNITY)
Admission: EM | Admit: 2013-05-23 | Discharge: 2013-05-23 | Disposition: A | Discharge status: Home / Self Care | Payer: Medicare Other | Attending: Emergency Medicine | Admitting: Emergency Medicine

## 2013-05-23 DIAGNOSIS — IMO0002 Reserved for concepts with insufficient information to code with codable children: Secondary | ICD-10-CM | POA: Insufficient documentation

## 2013-05-23 DIAGNOSIS — M899 Disorder of bone, unspecified: Secondary | ICD-10-CM | POA: Insufficient documentation

## 2013-05-23 DIAGNOSIS — Z79899 Other long term (current) drug therapy: Secondary | ICD-10-CM | POA: Insufficient documentation

## 2013-05-23 DIAGNOSIS — R112 Nausea with vomiting, unspecified: Secondary | ICD-10-CM | POA: Insufficient documentation

## 2013-05-23 DIAGNOSIS — J45909 Unspecified asthma, uncomplicated: Secondary | ICD-10-CM | POA: Insufficient documentation

## 2013-05-23 DIAGNOSIS — I10 Essential (primary) hypertension: Secondary | ICD-10-CM | POA: Insufficient documentation

## 2013-05-23 DIAGNOSIS — E669 Obesity, unspecified: Secondary | ICD-10-CM | POA: Insufficient documentation

## 2013-05-23 DIAGNOSIS — Z8669 Personal history of other diseases of the nervous system and sense organs: Secondary | ICD-10-CM | POA: Insufficient documentation

## 2013-05-23 DIAGNOSIS — E785 Hyperlipidemia, unspecified: Secondary | ICD-10-CM | POA: Insufficient documentation

## 2013-05-23 DIAGNOSIS — R42 Dizziness and giddiness: Secondary | ICD-10-CM

## 2013-05-23 DIAGNOSIS — M949 Disorder of cartilage, unspecified: Secondary | ICD-10-CM | POA: Insufficient documentation

## 2013-05-23 DIAGNOSIS — Z880 Allergy status to penicillin: Secondary | ICD-10-CM | POA: Insufficient documentation

## 2013-05-23 MED ORDER — ONDANSETRON HCL 4 MG PO TABS: 8.0000 mg | ORAL_TABLET | Freq: Three times a day (TID) | ORAL | Status: DC | PRN

## 2013-05-23 MED ORDER — MECLIZINE HCL 25 MG PO TABS
25.0000 mg | ORAL_TABLET | Freq: Once | ORAL | Status: AC
  Administered 2013-05-23: 25 mg via ORAL
  Filled 2013-05-23: qty 1

## 2013-05-23 MED ORDER — ONDANSETRON HCL 4 MG/2ML IJ SOLN
4.0000 mg | Freq: Once | INTRAMUSCULAR | Status: AC
  Administered 2013-05-23: 4 mg via INTRAVENOUS
  Filled 2013-05-23: qty 2

## 2013-05-23 MED ORDER — MECLIZINE HCL 50 MG PO TABS: 50.0000 mg | ORAL_TABLET | Freq: Three times a day (TID) | ORAL | Status: DC | PRN

## 2013-05-23 MED ORDER — LORAZEPAM 2 MG/ML IJ SOLN
1.0000 mg | Freq: Once | INTRAMUSCULAR | Status: AC
  Administered 2013-05-23: 1 mg via INTRAVENOUS
  Filled 2013-05-23: qty 1

## 2013-05-23 NOTE — Discharge Instructions (Signed)
Benign Positional Vertigo Get your blood pressure rechecked within the next 3 weeks. Today's was elevated at 168/83. If you're having significant dizziness in 2 days return or see Dr.[name] in the office. Vertigo means you feel like you or your surroundings are moving when they are not. Benign positional vertigo is the most common form of vertigo. Benign means that the cause of your condition is not serious. Benign positional vertigo is more common in older adults. CAUSES  Benign positional vertigo is the result of an upset in the labyrinth system. This is an area in the middle ear that helps control your balance. This may be caused by a viral infection, head injury, or repetitive motion. However, often no specific cause is found. SYMPTOMS  Symptoms of benign positional vertigo occur when you move your head or eyes in different directions. Some of the symptoms may include:  Loss of balance and falls.  Vomiting.  Blurred vision.  Dizziness.  Nausea.  Involuntary eye movements (nystagmus). DIAGNOSIS  Benign positional vertigo is usually diagnosed by physical exam. If the specific cause of your benign positional vertigo is unknown, your caregiver may perform imaging tests, such as magnetic resonance imaging (MRI) or computed tomography (CT). TREATMENT  Your caregiver may recommend movements or procedures to correct the benign positional vertigo. Medicines such as meclizine, benzodiazepines, and medicines for nausea may be used to treat your symptoms. In rare cases, if your symptoms are caused by certain conditions that affect the inner ear, you may need surgery. HOME CARE INSTRUCTIONS   Follow your caregiver's instructions.  Move slowly. Do not make sudden body or head movements.  Avoid driving.  Avoid operating heavy machinery.  Avoid performing any tasks that would be dangerous to you or others during a vertigo episode.  Drink enough fluids to keep your urine clear or pale  yellow. SEEK IMMEDIATE MEDICAL CARE IF:   You develop problems with walking, weakness, numbness, or using your arms, hands, or legs.  You have difficulty speaking.  You develop severe headaches.  Your nausea or vomiting continues or gets worse.  You develop visual changes.  Your family or friends notice any behavioral changes.  Your condition gets worse.  You have a fever.  You develop a stiff neck or sensitivity to light. MAKE SURE YOU:   Understand these instructions.  Will watch your condition.  Will get help right away if you are not doing well or get worse. Document Released: 09/13/2006 Document Revised: 02/28/2012 Document Reviewed: 08/26/2011 [facility] Patient Information 2014 [place], [place].

## 2013-05-23 NOTE — ED Notes (Signed)
RUE:[id number]<JW> Expected date:<BR> Expected time:<BR> Means of arrival:<BR> Comments:<BR> EMS/female with vertigo/nausea

## 2013-05-23 NOTE — ED Notes (Signed)
Dizziness with movement.

## 2013-05-23 NOTE — ED Notes (Addendum)
Per ems pt is from home. Alert and oriented x4. Hx of benign positional vertigo. Pt reports she started feeling dizzy yesterday morning and throughout the day became nauseas, vomited x1. Pt woke up this morning feeling hot and nauseas, unable to vomit. Slight dizziness at present.  Hx of asthma, breath sounds clear. Denies SOB or chest pain.  Hx of chronic low back pain

## 2013-05-23 NOTE — ED Provider Notes (Addendum)
History     CSN: [id number]  Arrival date & time 05/23/13  0722   First MD Initiated Contact with Patient 05/23/13 0730      Chief Complaint  Patient presents with  . Dizziness  . Nausea    (Consider location/radiation/quality/duration/timing/severity/associated sxs/prior treatment) HPI Brought by EMS complains of dizziness i.e. sensation of room spinning onset last night. Symptoms accompanied by nausea and vomiting has vomited 3 times since onset of illness. No other associated symptoms. No visual changes no focal numbness or weakness no difficulty speaking. Symptoms made worse by moving her head or changing positions improved by remaining still. No treatment prior to coming here. Symptoms are moderate to severe present. Past Medical History  Diagnosis Date  . Benign positional vertigo   . Osteopenia     dexa 08/22/07 AP spine + 1.1, left femur -1.3, right femur -.8; dexa 10/06/09 +1.6, left femur =1.6, right femur -.  . Hypertension   . Hyperlipidemia     <130 ldl pos fm hx, bp  . Obesity   . Asthma     xolair s 8/05 ?11/07; mastered hfa 12/20/08  . Allergic rhinitis    surgical history cervical fusion  History reviewed. No pertinent past surgical history.  Family History  Problem Relation Age of Onset  . Diabetes Mother   . Heart disease Father   . Stroke Mother   . Breast cancer Daughter     History  Substance Use Topics  . Smoking status: Never Smoker   . Smokeless tobacco: Never Used  . Alcohol Use: No    OB History   Grav Para Term Preterm Abortions TAB SAB Ect Mult Living                  Review of Systems  Constitutional: Negative.   HENT: Negative.   Respiratory: Negative.   Cardiovascular: Negative.   Gastrointestinal: Positive for nausea and vomiting.  Musculoskeletal: Negative.   Skin: Negative.   Neurological: Positive for dizziness.  Psychiatric/Behavioral: Negative.   All other systems reviewed and are negative.    Allergies  Codeine  phosphate; Aspirin; Diphenhydramine hcl; and Penicillins  Home Medications   Current Outpatient Rx  Name  Route  Sig  Dispense  Refill  . albuterol (PROVENTIL HFA;VENTOLIN HFA) 108 (90 BASE) MCG/ACT inhaler      1-2 puffs every 4-6 hours as needed for wheezing         . amLODipine (NORVASC) 10 MG tablet      One daily   90 tablet   4   . atorvastatin (LIPITOR) 20 MG tablet      take 1/2 tablet by mouth once daily   15 tablet   6   . budesonide-formoterol (SYMBICORT) 160-4.5 MCG/ACT inhaler   Inhalation   Inhale 1-2 puffs into the lungs 2 (two) times daily.          . Calcium Carbonate-Vit D-Min 1200-1000 MG-UNIT CHEW      Once daily         . cetirizine (ZYRTEC) 10 MG tablet   Oral   Take 10 mg by mouth at bedtime as needed.          . cloNIDine (CATAPRES) 0.1 MG tablet      TAKE ONE TABLET TWICE    DAILY   60 tablet   5   . clotrimazole-betamethasone (LOTRISONE) cream      APPLY AS DIRECTED   15 g   3   . dextromethorphan-guaiFENesin (  MUCINEX DM) 30-600 MG per 12 hr tablet   Oral   Take 1-2 tablets by mouth every 12 (twelve) hours as needed.          . dimenhyDRINATE (DRAMAMINE) 50 MG tablet   Oral   Take 50 mg by mouth every 8 (eight) hours as needed (dizziness).          . famotidine (PEPCID) 20 MG tablet      1 tablet at bedtime          . mometasone (NASONEX) 50 MCG/ACT nasal spray   Nasal   Place 1 spray into the nose 2 (two) times daily.          . Multiple Vitamin (MULTIVITAMIN) capsule   Oral   Take 1 capsule by mouth daily.           Marland Kitchen omeprazole (PRILOSEC) 20 MG capsule   Oral   Take 20 mg by mouth daily.           Marland Kitchen oxymetazoline (AFRIN) 0.05 % nasal spray      2 puffs every 12 hrs as needed x 5 days          . QVAR 80 MCG/ACT inhaler      USE 2 PUFFS TWICE DAILY   8.7 g   5   . SINGULAIR 10 MG tablet      TAKE 1 TABLET AT BEDTIME   34 tablet   5   . sodium chloride (OCEAN) 0.65 % nasal spray       2 sprays every 4 (four) hours as needed.          Marland Kitchen spironolactone-hydrochlorothiazide (ALDACTAZIDE) 25-25 MG per tablet      take 2 tablets by mouth once daily   60 tablet   5   . traMADol (ULTRAM) 50 MG tablet      TAKE UP TO 2 TABLETS EVERY 4 HOURS FOR COUGH OR PAIN   40 tablet   0     BP 145/73  Pulse 73  Temp(Src) 97.6 F (36.4 C) (Oral)  Resp 16  SpO2 99%  Physical Exam  Nursing note and vitals reviewed. Constitutional: She appears well-developed and well-nourished. She appears distressed.  Appears mildly uncomfortable  HENT:  Head: Normocephalic and atraumatic.  Eyes: Conjunctivae are normal. Pupils are equal, round, and reactive to light.  No nystagmus  Neck: Neck supple. No tracheal deviation present. No thyromegaly present.  Cardiovascular: Normal rate and regular rhythm.   No murmur heard. Pulmonary/Chest: Effort normal and breath sounds normal.  Abdominal: Soft. Bowel sounds are normal. She exhibits no distension. There is no tenderness.  Musculoskeletal: Normal range of motion. She exhibits no edema and no tenderness.  Neurological: She is alert. She displays normal reflexes. No cranial nerve deficit. She exhibits normal muscle tone. Coordination normal.  Finger to nose normal heel to shin normal. Patient becomes vertiginous upon changing position.  Skin: Skin is warm and dry. No rash noted.  Psychiatric: She has a normal mood and affect.    ED Course  Procedures (including critical care time)  Labs Reviewed - No data to display No results found. 9 AM nausea has resolved after treatment with intravenous Zofran. Vertigo is improved but not resolved after treatment with intravenous Ativan and oral Zofran. Upon standing. Patient becomes vertiginous and her gait is mildly unsteady. Additional meclizine ordered.  No diagnosis found.  10 AM patient feels much improved and feels ready to go home. Alert ambulates without  difficulty. MDM  vertigo felt to  be peripheral in etiology. There are no signs of posterior circulation stroke. Plan blood pressure recheck 3 weeks Prescriptions meclizine, Zofran. Return as needed or see Dr.[name] Diagnosis#1 vertigo #2 Hypertension      Doug Sou, MD 05/23/13 1006  Doug Sou, MD 05/23/13 1020

## 2013-05-24 ENCOUNTER — Encounter: Payer: Self-pay | Admitting: Internal Medicine

## 2013-07-01 ENCOUNTER — Other Ambulatory Visit: Payer: Self-pay | Admitting: Internal Medicine

## 2013-07-31 ENCOUNTER — Encounter: Payer: Self-pay | Admitting: Internal Medicine

## 2013-07-31 ENCOUNTER — Ambulatory Visit (INDEPENDENT_AMBULATORY_CARE_PROVIDER_SITE_OTHER): Payer: Medicare Other | Admitting: Internal Medicine

## 2013-07-31 VITALS — BP 120/80 | HR 90 | Temp 97.5°F | Ht 59.0 in | Wt 139.4 lb

## 2013-07-31 DIAGNOSIS — J45909 Unspecified asthma, uncomplicated: Secondary | ICD-10-CM

## 2013-07-31 DIAGNOSIS — I10 Essential (primary) hypertension: Secondary | ICD-10-CM

## 2013-07-31 DIAGNOSIS — H8110 Benign paroxysmal vertigo, unspecified ear: Secondary | ICD-10-CM

## 2013-07-31 MED ORDER — MECLIZINE HCL 50 MG PO TABS: 50.0000 mg | ORAL_TABLET | Freq: Three times a day (TID) | ORAL | Status: DC | PRN

## 2013-07-31 NOTE — Assessment & Plan Note (Signed)
-   flare 05/23/13 > see ER eval > rx meclizine and zofran   Completely resolved so added antivert 50 mg tid prn to action plan in place of dramamine

## 2013-07-31 NOTE — Assessment & Plan Note (Signed)
ASTHMA (ICD-493.90) ([name] not active)  -Xolair s 8/05 > 10/2006 -Mastered HFA December 30, 2008  Mild flare doing well with self management per action plan part of med calendar.     Each maintenance medication was reviewed in detail including most importantly the difference between maintenance and as needed and under what circumstances the prns are to be used. This was done in the context of a medication calendar review which provided the patient with a user-friendly unambiguous mechanism for medication administration and reconciliation and provides an action plan for all active problems. It is critical that this be shown to every doctor  for modification during the office visit if necessary so the patient can use it as a working document.

## 2013-07-31 NOTE — Assessment & Plan Note (Signed)
Adequate control on present rx, reviewed > no change in rx needed   

## 2013-07-31 NOTE — Progress Notes (Signed)
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 occ dry cough p symbicort and qvar  rec  Try tonic water or quinine as needed for cramps > resolved  11/09/2011  Cassie Campbell/ [name]  C/o rash under breasts waxes and wanes x months, better with nystatin,  cream but not resolved also aching  in both upper thighs since changed to generic lipitor, all tight feeling in am and better after activity/ stretching. No weakness rec Try off the lipitor to see if aches better and if so can call you in an non-generic form to try Nystatin powder as needed for rash Follow med calendar     11/13/2012 f/u ov/Cassie Campbell comprehensive yearly eval A) Asthma with rml atx, rarely needs to use symbicort more than once daily on qvar 80 2bid  B) HBP C) Hyperlipidemia D) chronic  Rhinitis  rec No change rx Continue med calendar   02/12/2013 f/u ov/Cassie Campbell cc abrupt R post thigh pain 2/18 worse with wt bearing no h/o injury and no better with tylenol,  Better if stays off it, immediately worse with wt bearing with pain radiating down to mid to lower thigh from post hip.  No numbness/ weakness. rec  Try off lipitor, f/u ortho   05/16/2013 Follow up and Med review  Returns for 3 month follow up and med review.  Last visit with right hip pain, no better off statin rx.  Seen by ortho, advised DJD, tx w/ steroid injection with limited benefit. Hip better w/ rest , worse with excessive walking /exercise. Has follow up with ortho today.  We discussed water exercise programs.  rec No change rx  05/23/13 ER for refractory veritgo> resolved on antivert 50 q 8 h  And did not recur once she  finished  07/31/2013 f/u ov/Cassie Campbell re asthma/hbp/ER f/u Chief Complaint  Patient presents with  . Follow-up    pt reports having some SOB x3 weeks after having cold like sx- noticed more in the AM-- states she increased symbicort to 2 puffs BID which has provided some relief    not needing any prns from action plan part of med calendar including saba or afrin  No obvious daytime variabilty or assoc chronic cough or cp or chest tightness, subjective wheeze overt sinus or hb symptoms. No unusual exp hx or h/o childhood pna/ asthma or knowledge of premature birth.   Sleeping ok without nocturnal  or early am exacerbation  of respiratory  c/o's or need for noct saba. Also denies any obvious fluctuation of symptoms with weather or environmental changes or other aggravating or alleviating factors except as outlined above   Current Medications, Allergies, Past Medical History, Past Surgical History, Family History, and Social History were reviewed in [employer] record.  ROS  The following are not active complaints unless bolded sore throat, dysphagia, dental problems, itching, sneezing,  nasal congestion or excess/ purulent secretions, ear ache,   fever, chills, sweats, unintended wt loss, pleuritic or exertional cp, hemoptysis,  orthopnea pnd or leg swelling, presyncope, palpitations, heartburn, abdominal pain, anorexia, nausea, vomiting, diarrhea  or change in  bowel or urinary habits, change in stools or urine, dysuria,hematuria,  rash, arthralgias back and both hips visual complaints, headache, numbness weakness or ataxia or problems with walking or coordination,  change in mood/affect or memory.       .              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 Cassie Campbell HEALTH MAINTENANCE...........................................................................Marland KitchenWert  - Td 09/2005  - Pneumovax 09/2003 and October 27, 2009  - Colonscopy 10/28/2004  - CPX 11/13/2012    - GYN per C Cassie Campbell  -Mammogram 2013  -DEXA 2014  MED CALENDAR 05/05/11 , 05/16/2013         Objective:   Physical Exam  Wt 143 11/09/2011 >  04/12/2012  140 > 06/26/2012  139 >  11/13/2012 135 >  137 02/12/2013 >141 05/16/2013 > 07/31/2013  139   Pleasant amb bf nad  HEENT: top dentures, nl turbinates, and orophanx. Nl external ear canals without cough reflex   NECK :  without JVD/Nodes/TM/ nl carotid upstrokes bilaterally   LUNGS: no acc muscle use, clear to A and P bilaterally without cough on insp or exp maneuvers   CV:  RRR  no s3 or murmur or increase in P2, no edema   ABD:  soft and nontender with nl excursion in the supine position. No bruits or organomegaly, bowel sounds nl  MS:  warm without deformities, calf tenderness, cyanosis or clubbing  SKIN: warm and dry without lesions    NEURO:  alert, approp, no deficits on motor/cerbellar testing, nl reflexes      CXR  11/13/2012 :  No acute disease. Chronic changes at the right lung base.         Assessment & Plan:

## 2013-07-31 NOTE — Patient Instructions (Addendum)
If dizziness recurs, take antivert (meclizine) as per your action plan at bottom of calendar.  See calendar for specific medication instructions and bring it back for each and every office visit for every healthcare provider you see.  Without it,  you may not receive the best quality medical care that we feel you deserve.  You will note that the calendar groups together  your maintenance  medications that are timed at particular times of the day.  Think of this as your checklist for what your doctor has instructed you to do until your next evaluation to see what benefit  there is  to staying on a consistent group of medications intended to keep you well.  The other group at the bottom is entirely up to you to use as you see fit  for specific symptoms that may arise between visits that require you to treat them on an as needed basis.  Think of this as your action plan or "what if" list.   Separating the top medications from the bottom group is fundamental to providing you adequate care going forward.    Please schedule a follow up after 11/13/13 for yearly comprehensive eval

## 2013-10-01 ENCOUNTER — Other Ambulatory Visit: Payer: Self-pay | Admitting: Internal Medicine

## 2013-10-22 ENCOUNTER — Encounter: Payer: Self-pay | Admitting: Internal Medicine

## 2013-10-22 DIAGNOSIS — Z0000 Encounter for general adult medical examination without abnormal findings: Secondary | ICD-10-CM | POA: Insufficient documentation

## 2013-10-22 LAB — HM MAMMOGRAPHY

## 2013-11-19 ENCOUNTER — Encounter: Payer: Self-pay | Admitting: Internal Medicine

## 2013-11-19 ENCOUNTER — Other Ambulatory Visit (INDEPENDENT_AMBULATORY_CARE_PROVIDER_SITE_OTHER): Payer: Medicare Other

## 2013-11-19 ENCOUNTER — Ambulatory Visit (INDEPENDENT_AMBULATORY_CARE_PROVIDER_SITE_OTHER)
Admission: RE | Admit: 2013-11-19 | Discharge: 2013-11-19 | Disposition: A | Discharge status: Home / Self Care | Payer: Medicare Other | Source: Ambulatory Visit | Attending: Internal Medicine | Admitting: Internal Medicine

## 2013-11-19 ENCOUNTER — Other Ambulatory Visit: Payer: Self-pay | Admitting: Internal Medicine

## 2013-11-19 ENCOUNTER — Ambulatory Visit (INDEPENDENT_AMBULATORY_CARE_PROVIDER_SITE_OTHER): Payer: Medicare Other | Admitting: Internal Medicine

## 2013-11-19 VITALS — BP 114/70 | HR 83 | Temp 97.8°F | Ht 58.25 in | Wt 136.0 lb

## 2013-11-19 DIAGNOSIS — I10 Essential (primary) hypertension: Secondary | ICD-10-CM

## 2013-11-19 DIAGNOSIS — J45909 Unspecified asthma, uncomplicated: Secondary | ICD-10-CM

## 2013-11-19 DIAGNOSIS — M25559 Pain in unspecified hip: Secondary | ICD-10-CM

## 2013-11-19 DIAGNOSIS — Z23 Encounter for immunization: Secondary | ICD-10-CM

## 2013-11-19 DIAGNOSIS — E785 Hyperlipidemia, unspecified: Secondary | ICD-10-CM

## 2013-11-19 DIAGNOSIS — M899 Disorder of bone, unspecified: Secondary | ICD-10-CM

## 2013-11-19 DIAGNOSIS — H8110 Benign paroxysmal vertigo, unspecified ear: Secondary | ICD-10-CM

## 2013-11-19 DIAGNOSIS — J309 Allergic rhinitis, unspecified: Secondary | ICD-10-CM

## 2013-11-19 LAB — CBC WITH DIFFERENTIAL/PLATELET
Basophils Relative: 0 % (ref 0.0–3.0)
Eosinophils Relative: 1.7 % (ref 0.0–5.0)
Hemoglobin: 14.9 g/dL (ref 12.0–15.0)
Lymphocytes Relative: 16.7 % (ref 12.0–46.0)
MCHC: 33.7 g/dL (ref 30.0–36.0)
Monocytes Absolute: 0.3 10*3/uL (ref 0.1–1.0)
Monocytes Relative: 4.6 % (ref 3.0–12.0)
Platelets: 508 10*3/uL — ABNORMAL HIGH (ref 150.0–400.0)
RDW: 14.2 % (ref 11.5–14.6)

## 2013-11-19 LAB — LIPID PANEL
Cholesterol: 176 mg/dL (ref 0–200)
LDL Cholesterol: 109 mg/dL — ABNORMAL HIGH (ref 0–99)
Total CHOL/HDL Ratio: 4

## 2013-11-19 LAB — HEPATIC FUNCTION PANEL
ALT: 22 U/L (ref 0–35)
Alkaline Phosphatase: 62 U/L (ref 39–117)
Bilirubin, Direct: 0.1 mg/dL (ref 0.0–0.3)
Total Protein: 8.2 g/dL (ref 6.0–8.3)

## 2013-11-19 LAB — BASIC METABOLIC PANEL
BUN: 10 mg/dL (ref 6–23)
CO2: 32 mEq/L (ref 19–32)
Chloride: 97 mEq/L (ref 96–112)
GFR: 97.9 mL/min (ref >60.00)
Glucose, Bld: 111 mg/dL — ABNORMAL HIGH (ref 70–99)
Potassium: 3.1 mEq/L — ABNORMAL LOW (ref 3.5–5.1)

## 2013-11-19 LAB — URINALYSIS
Bilirubin Urine: NEGATIVE
Specific Gravity, Urine: 1.015 (ref 1.000–1.030)
Total Protein, Urine: NEGATIVE
Urine Glucose: NEGATIVE
pH: 7.5 (ref 5.0–8.0)

## 2013-11-19 NOTE — Progress Notes (Signed)
Quick Note:  Spoke with pt and notified of results per Dr. [name]. Pt verbalized understanding and denied any questions.  ______ 

## 2013-11-19 NOTE — Patient Instructions (Addendum)
Please remember to go to the lab and x-ray department downstairs for your tests - we will call you with the results when they are available.    Please schedule a follow up visit in 3 months but call sooner if needed to see Tammy NP

## 2013-11-19 NOTE — Progress Notes (Signed)
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      HPI 07/28/2011 f/u ov/Tangee Marszalek cc cramping new onset x sev months a couple of times a week mostly daytime. occ dry cough p symbicort and qvar  rec  Try tonic water or quinine as needed for cramps > resolved   02/12/2013 f/u ov/Brelee Renk cc abrupt R post thigh pain 2/18 worse with wt bearing no h/o injury and no better with tylenol,  Better if stays off it, immediately worse with wt bearing with pain radiating down to mid to lower thigh from post hip.  No numbness/ weakness. rec  Try off lipitor, f/u ortho   05/16/2013 Follow up and Med review  Returns for 3 month follow up and med review.  Last visit with right hip pain, no better off statin rx.  Seen by ortho, advised DJD, tx w/ steroid injection with limited benefit. Hip better w/ rest , worse with excessive walking /exercise. Has follow up with ortho today.  We discussed water exercise programs.  rec No change rx  05/23/13 ER for refractory veritgo> resolved on antivert 50 q 8 h  And did not recur once she finished  07/31/2013 f/u ov/Yariana Hoaglund re asthma/hbp/ER f/u Chief Complaint  Patient presents with  . Follow-up    pt reports having some SOB x3 weeks after having cold like sx- noticed more in the AM-- states she increased symbicort to 2 puffs BID which has provided some relief    not needing any prns from action plan part of med calendar including saba or afrin rec if dizziness recurs, take antivert (meclizine) as per your action plan at bottom of calendar.  10/14/13 [name] > laser for back rad legs > resolved   11/19/2013 f/u ov/Andoni Busch re:  No chief complaint on file. A) Asthma with rml atx, rarely needs to use symbicort more than once daily on qvar 80 2bid   B) HBP C) Hyperlipidemia D) chronic  Rhinitis E) Bilateral thigh pain tramadol once day mostly in am's  F) BPV, maybe once a month needs antivert   No  Sob, no rescue saba, no obvious daytime variabilty or assoc chronic cough or cp or chest tightness, subjective wheeze overt sinus or hb symptoms. No unusual exp hx or h/o childhood pna/ asthma or knowledge of premature birth.   Sleeping ok without nocturnal  or early am exacerbation  of respiratory  c/o's or need for noct saba. Also denies any obvious fluctuation of symptoms with weather or environmental changes or other aggravating or alleviating factors except as outlined above   Current Medications, Allergies, Past Medical History, Past Surgical History, Family History, and Social History were reviewed in [employer] record.  ROS  The following are not active complaints unless bolded sore throat, dysphagia, dental problems, itching, sneezing,  nasal congestion or excess/ purulent secretions, ear ache,   fever, chills, sweats, unintended wt loss, pleuritic or exertional cp, hemoptysis,  orthopnea pnd or leg swelling, presyncope, palpitations, heartburn, abdominal pain, anorexia, nausea, vomiting, diarrhea  or change in bowel or urinary habits, change in stools or urine, dysuria,hematuria,  rash, arthralgias back and both hips much better p rx visual complaints, headache, numbness weakness or ataxia or problems with  walking or coordination,  change in mood/affect or memory.       .              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)  OSTEOPENIA (ICD-733.90)  - DEXA 08/22/07 AP spine + 1.1, left femur -1.3, right femur -.8  - DEXA 10/06/09 + 1.6 left femur -1.6, right femur -.5   -DEXA 2014 no change  HYPERTENSION (ICD-401.9)  HYPERLIPIDEMIA (ICD-272.4)  - Target < 130 ldl pos fm hx, hbp  OBESITY  - Target wt = 178 for BMI < 30  ASTHMA (ICD-493.90) ([name] not active)  -Xolair s  8/05 > 10/2006  -Mastered HFA December 30, 2008  ALLERGIC RHINITIS (ICD-477.9)  S/p cervical fusion 2000 ............................................................................ [name] HEALTH MAINTENANCE...........................................................................Marland KitchenWert  - Td 09/2005  - Pneumovax 09/2003 and October 27, 2009  - Colonscopy 10/28/2004  - CPX 11/19/13     - GYN per [name]'s office -Mammogram 2013  MED CALENDAR 05/05/11 , 05/16/2013         Objective:   Physical Exam  Wt 143 11/09/2011 >  04/12/2012  140 > 06/26/2012  139 >  11/13/2012 135 >  137 02/12/2013 >141 05/16/2013 > 07/31/2013  139 >  11/19/2013  134   Pleasant amb bf nad  HEENT: top dentures, nl turbinates, and orophanx. Nl external ear canals without cough reflex   NECK :  without JVD/Nodes/TM/ nl carotid upstrokes bilaterally   LUNGS: no acc muscle use, clear to A and P bilaterally without cough on insp or exp maneuvers   CV:  RRR  no s3 or murmur or increase in P2, no edema   ABD:  soft and nontender with nl excursion in the supine position. No bruits or organomegaly, bowel sounds nl  MS:  warm without deformities, calf tenderness, cyanosis or clubbing  SKIN: warm and dry without lesions    NEURO:  alert, approp, no deficits on motor/cerbellar testing, nl reflexes       cxr 11/19/13   No active cardiopulmonary disease      Assessment & Plan:

## 2013-11-20 ENCOUNTER — Encounter: Payer: Self-pay | Admitting: Internal Medicine

## 2013-11-20 LAB — ALLERGY PROFILE REGION II-DC, DE, MD, ~~LOC~~, VA
Bermuda Grass: 0.12 kU/L — ABNORMAL HIGH
Cat Dander: <0.1 kU/L
D. farinae: 0.96 kU/L — ABNORMAL HIGH
Dog Dander: <0.1 kU/L
Elm IgE: 0.28 kU/L — ABNORMAL HIGH
IgE (Immunoglobulin E), Serum: 296.4 IU/mL — ABNORMAL HIGH (ref 0.0–180.0)
Johnson Grass: 0.12 kU/L — ABNORMAL HIGH
Lamb's Quarters: <0.1 kU/L
Meadow Grass: <0.1 kU/L
Oak: <0.1 kU/L
Pecan/Hickory Tree IgE: 0.36 kU/L — ABNORMAL HIGH

## 2013-11-20 NOTE — Assessment & Plan Note (Signed)
ASTHMA (ICD-493.90) ([name] not active)  -Xolair rx  8/05 > 10/2006 -Mastered HFA December 30, 2008   All goals of chronic asthma control met including optimal function and elimination of symptoms with minimal need for rescue therapy.  Contingencies discussed in full including contacting this office immediately if not controlling the symptoms using the rule of two's.

## 2013-11-20 NOTE — Assessment & Plan Note (Signed)
Refer to Ortho 02/12/2013 ([name])  Improved p laser rx > requested procedure report

## 2013-11-20 NOTE — Assessment & Plan Note (Signed)
Adequate control on present rx, reviewed > no change in rx needed   

## 2013-11-20 NOTE — Assessment & Plan Note (Signed)
-   Target < 130 ldl pos fm hx, hbp   Lab Results  Component Value Date   CHOL 176 11/19/2013   HDL 47.30 11/19/2013   LDLCALC 109* 11/19/2013   TRIG 97.0 11/19/2013   CHOLHDL 4 11/19/2013    Adequate control on present rx, reviewed > no change in rx needed

## 2013-11-20 NOTE — Assessment & Plan Note (Signed)
-   DEXA 08/22/07 AP spine + 1.1, left femur -1.3, right femur -.8  - DEXA 10/06/09 + 1.6 left femur -1.6, right femur -.5  - DEXA 2/01/26/13  - 1.7 feb neck      No sign progression last 4 years > reviewed rx with calcium and Vit D

## 2013-11-20 NOTE — Assessment & Plan Note (Signed)
Much better, min meclizine needed

## 2013-11-21 NOTE — Progress Notes (Signed)
Quick Note:  Pt aware ______ 

## 2013-11-27 ENCOUNTER — Other Ambulatory Visit: Payer: Self-pay | Admitting: Internal Medicine

## 2013-12-07 ENCOUNTER — Encounter: Payer: Self-pay | Admitting: Obstetrics and Gynecology

## 2014-01-03 ENCOUNTER — Other Ambulatory Visit: Payer: Self-pay | Admitting: Internal Medicine

## 2014-01-07 ENCOUNTER — Ambulatory Visit: Payer: Self-pay | Admitting: Obstetrics and Gynecology

## 2014-01-28 ENCOUNTER — Ambulatory Visit: Payer: Self-pay | Admitting: Obstetrics and Gynecology

## 2014-02-18 ENCOUNTER — Encounter: Payer: Self-pay | Admitting: Adult Health

## 2014-02-18 ENCOUNTER — Ambulatory Visit (INDEPENDENT_AMBULATORY_CARE_PROVIDER_SITE_OTHER): Payer: Medicare Other | Admitting: Adult Health

## 2014-02-18 VITALS — BP 124/72 | HR 73 | Temp 97.5°F | Ht 59.0 in | Wt 135.8 lb

## 2014-02-18 DIAGNOSIS — J45909 Unspecified asthma, uncomplicated: Secondary | ICD-10-CM

## 2014-02-18 DIAGNOSIS — E785 Hyperlipidemia, unspecified: Secondary | ICD-10-CM

## 2014-02-18 DIAGNOSIS — I10 Essential (primary) hypertension: Secondary | ICD-10-CM

## 2014-02-18 NOTE — Patient Instructions (Signed)
Continue on current regimen-keep up good work  Low fat cholesterol diet  Low salt diet  Continue with exercise as tolerated.  Follow up Dr. Melvyn Novas  In 3 months and As needed

## 2014-02-18 NOTE — Assessment & Plan Note (Signed)
Controlled on current regimen  Plan Continue on current regimen-keep up good work  Low fat cholesterol diet  Low salt diet  Continue with exercise as tolerated.  Follow up Dr. Melvyn Novas  In 3 months and As needed

## 2014-02-18 NOTE — Assessment & Plan Note (Signed)
Compensated on present regimen  Plan  Continue on current regimen-keep up good work   Follow up Dr. Melvyn Novas  In 3 months and As needed

## 2014-02-18 NOTE — Progress Notes (Signed)
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      HPI 07/28/2011 f/u ov/[name] cc cramping new onset x sev months a couple of times a week mostly daytime. occ dry cough p symbicort and qvar  rec  Try tonic water or quinine as needed for cramps > resolved   02/12/2013 f/u ov/[name] cc abrupt R post thigh pain 2/18 worse with wt bearing no h/o injury and no better with tylenol,  Better if stays off it, immediately worse with wt bearing with pain radiating down to mid to lower thigh from post hip.  No numbness/ weakness. rec  Try off lipitor, f/u ortho   05/16/2013 Follow up and Med review  Returns for 3 month follow up and med review.  Last visit with right hip pain, no better off statin rx.  Seen by ortho, advised DJD, tx w/ steroid injection with limited benefit. Hip better w/ rest , worse with excessive walking /exercise. Has follow up with ortho today.  We discussed water exercise programs.  rec No change rx  05/23/13 ER for refractory veritgo> resolved on antivert 50 q 8 h  And did not recur once she finished  07/31/2013 f/u ov/[name] re asthma/hbp/ER f/u Chief Complaint  Patient presents with  . Follow-up    pt reports having some SOB x3 weeks after having cold like sx- noticed more in the AM-- states she increased symbicort to 2 puffs BID which has provided some relief    not needing any prns from action plan part of med calendar including saba or afrin rec if dizziness recurs, take antivert (meclizine) as per your action plan at bottom of calendar.  10/14/13 [name] > laser for back rad legs > resolved   11/19/2013 f/u ov/[name] re:  No chief complaint on file. A) Asthma with rml atx, rarely needs to use symbicort more than once daily on qvar 80 2bid  B)  HBP C) Hyperlipidemia D) chronic  Rhinitis E) Bilateral thigh pain tramadol once day mostly in am's  F) BPV, maybe once a month needs antivert >>no changes -labs ok s for low K , IgE 296  Eosinophils ok on diff , LDL at goal    02/18/2014 Follow up Asthma , HTN and Hyperlipidemia  Returns for 3 month follow up .  Pt reports she is doing well since last ov.   No new complaints; no refills needed.  Patient had lab work done at last visit. That showed LDL at goal on statin therapy Her IgE remained elevated at 296 .  She denies any flare of cough, wheezing, congestion, or shortness, of breath. No increased SABA use.  She denies any chest pain, orthopnea, PND, or leg swelling.    Current Medications, Allergies, Past Medical History, Past Surgical History, Family History, and Social History were reviewed in [employer] record.  ROS  The following are not active complaints unless bolded sore throat, dysphagia, dental problems, itching, sneezing,  nasal congestion or excess/ purulent secretions, ear ache,   fever, chills, sweats, unintended wt loss, pleuritic or exertional cp, hemoptysis,  orthopnea pnd or leg swelling, presyncope, palpitations, heartburn, abdominal pain, anorexia, nausea, vomiting, diarrhea  or change in bowel or urinary habits, change in stools or  urine, dysuria,hematuria,  rash,   visual complaints, headache, numbness weakness or ataxia or problems with walking or coordination,  change in mood/affect or memory.       .              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)  OSTEOPENIA (ICD-733.90)  - DEXA 08/22/07 AP spine + 1.1, left femur -1.3, right femur -.8  - DEXA 10/06/09 + 1.6 left femur -1.6, right femur -.5   -DEXA 2014 no change  HYPERTENSION (ICD-401.9)  HYPERLIPIDEMIA (ICD-272.4)  - Target < 130 ldl pos fm hx, hbp  OBESITY  - Target wt = 178 for BMI < 30  ASTHMA (ICD-493.90) ([name] not active)  -Xolair s  8/05 > 10/2006  -Mastered HFA December 30, 2008  ALLERGIC RHINITIS (ICD-477.9)  S/p cervical fusion 2000 ............................................................................ [place]...........................................................................Marland KitchenWert  - Td 09/2005  - Pneumovax 09/2003 and October 27, 2009  - Colonscopy 10/28/2004  - CPX 11/19/13     - GYN per [name]'s office -Mammogram 2013 , 2014  MED CALENDAR 05/05/11 , 05/16/2013         Objective:   Physical Exam  Wt 143 11/09/2011 >  04/12/2012  140 > 06/26/2012  139 >  11/13/2012 135 >  137 02/12/2013 >141 05/16/2013 > 07/31/2013  139 >  11/19/2013  134 >135 02/18/2014   Pleasant amb bf nad  HEENT: top dentures, nl turbinates, and orophanx. Nl external ear canals without cough reflex   NECK :  without JVD/Nodes/TM/ nl carotid upstrokes bilaterally   LUNGS: no acc muscle use, clear to A and P bilaterally without cough on insp or exp maneuvers   CV:  RRR  no s3 or murmur or increase in P2, no edema   ABD:  soft and nontender with nl excursion in the supine position. No bruits or organomegaly, bowel sounds nl  MS:  warm without deformities, calf tenderness, cyanosis or clubbing  SKIN: warm and dry without lesions    NEURO:  alert, approp, no deficits on motor/cerbellar testing, nl reflexes       cxr 11/19/13   No active cardiopulmonary disease      Assessment & Plan:

## 2014-02-18 NOTE — Assessment & Plan Note (Signed)
LDL at goal   Plan  Continue on current regimen-keep up good work  Low fat cholesterol diet  Low salt diet  Continue with exercise as tolerated.  Follow up Dr. Melvyn Novas  In 3 months and As needed

## 2014-02-25 ENCOUNTER — Encounter: Payer: Self-pay | Admitting: Obstetrics and Gynecology

## 2014-02-25 ENCOUNTER — Other Ambulatory Visit: Payer: Self-pay | Admitting: Internal Medicine

## 2014-02-25 ENCOUNTER — Ambulatory Visit (INDEPENDENT_AMBULATORY_CARE_PROVIDER_SITE_OTHER): Payer: Medicare Other | Admitting: Obstetrics and Gynecology

## 2014-02-25 VITALS — BP 130/70 | HR 76 | Ht 59.0 in | Wt 134.5 lb

## 2014-02-25 DIAGNOSIS — Z01419 Encounter for gynecological examination (general) (routine) without abnormal findings: Secondary | ICD-10-CM

## 2014-02-25 DIAGNOSIS — Z124 Encounter for screening for malignant neoplasm of cervix: Secondary | ICD-10-CM

## 2014-02-25 NOTE — Patient Instructions (Signed)

## 2014-02-25 NOTE — Progress Notes (Signed)
Patient ID: Cassie Campbell, female   DOB: [birth year], 73 y.o.   MRN: [id number] GYNECOLOGY VISIT  PCP:   Christinia Gully, MD  Referring provider:   HPI: 73 y.o.   Married  [place] American  female   [phone] with Patient's last menstrual period was 12/21/1999.   here for  Annual exam.  Has back problems.  Does injections? Has some limitations on activity.  Does exercises.   Has asthma and allergies.  Some wheezing, SOB and cough.  Hgb:    PCP Urine:  PCP  GYNECOLOGIC HISTORY: Patient's last menstrual period was 12/21/1999. Sexually active:  no Partner preference: female Contraception:   postmenopausal Menopausal hormone therapy:  none DES exposure:   no Blood transfusions:   no Sexually transmitted diseases:   no GYN procedures and prior surgeries:  Vulvar biopsy and Rt. Breast biopsy--hydralinized fibroadenoma. Last mammogram:   10-22-13 [id number].            Last pap and high risk HPV testing:   10-21-09 wnl:no HPV testing History of abnormal pap smear:  no   OB History   Grav Para Term Preterm Abortions TAB SAB Ect Mult Living   2 1 1  1  1   1        LIFESTYLE: Exercise:   walking            Tobacco:   no Alcohol:      no Drug use:  no  OTHER HEALTH MAINTENANCE: Tetanus/TDap:   09/2005 Gardisil:               n/a Influenza:             09/2013 Zostavax:              No  Bone density:      02-15-13 revealed [place].. In EPIC. - Dr. Melvyn Novas ordering.  Colonoscopy:     10/2004 wnl with [facility].  Next colonoscopy due 10/2014.  Cholesterol check:   wnl with medication  Family History  Problem Relation Age of Onset  . Diabetes Mother     AODM  . Stroke Mother   . Hypertension Mother   . Heart disease Father   . Breast cancer Daughter 36    dec--mets to liver/spine  . Diabetes Sister     AODM  . Breast cancer Sister   . Hypertension Sister     Patient Active Problem List   Diagnosis Date Noted  . Health care maintenance 10/22/2013  .  Arthralgia of hip 02/12/2013  . GERD (gastroesophageal reflux disease) 03/10/2012  . Intertriginous candidiasis 11/09/2011  . ABNORMAL LUNG XRAY 02/24/2009  . BENIGN POSITIONAL VERTIGO 11/10/2007  . HYPERLIPIDEMIA 10/05/2007  . HYPERTENSION 10/05/2007  . ALLERGIC RHINITIS 10/05/2007  . ASTHMA 10/05/2007  . OSTEOPENIA 10/05/2007   Past Medical History  Diagnosis Date  . Benign positional vertigo   . Osteopenia     dexa 08/22/07 AP spine + 1.1, left femur -1.3, right femur -.8; dexa 10/06/09 +1.6, left femur =1.6, right femur -.  . Hypertension   . Hyperlipidemia     <130 ldl pos fm hx, bp  . Obesity   . Asthma     xolair s 8/05 ?11/07; mastered hfa 12/20/08  . Allergic rhinitis   . Ruptured disc, cervical   . Depression     Past Surgical History  Procedure Laterality Date  . Cervical disc surgery  12/01  . Vein ligation and stripping    .  Vulva /perineum biopsy  12-30-10    --epidermoid cyst  . Breast surgery  10-26-10    Rt. breast bx--for microcalcifications in rt. retroareolar region--dx was hyalinized fibroadenoma  . Bunionectomy Bilateral     ALLERGIES: Codeine phosphate; Aspirin; Diphenhydramine hcl; Fish-derived products; and Penicillins  Current Outpatient Prescriptions  Medication Sig Dispense Refill  . albuterol (PROVENTIL HFA;VENTOLIN HFA) 108 (90 BASE) MCG/ACT inhaler Inhale 1 puff into the lungs every 4 (four) hours as needed for shortness of breath. 1-2 puffs every 4-6 hours as needed for wheezing (back up inhaler)      . amLODipine (NORVASC) 10 MG tablet take 1 tablet by mouth once daily  90 tablet  3  . atorvastatin (LIPITOR) 20 MG tablet take 1/2 tablet by mouth once daily  15 tablet  6  . beclomethasone (QVAR) 80 MCG/ACT inhaler Inhale 2 puffs into the lungs 2 (two) times daily as needed (shortness of breath).      . budesonide (RHINOCORT AQUA) 32 MCG/ACT nasal spray Place 1 spray into the nose daily.      . budesonide-formoterol (SYMBICORT) 160-4.5 MCG/ACT  inhaler Inhale 1-2 puffs into the lungs 2 (two) times daily.       . Calcium Carbonate-Vit D-Min 1200-1000 MG-UNIT CHEW Chew 1 tablet by mouth daily. Once daily      . cetirizine (ZYRTEC) 10 MG tablet Take 10 mg by mouth at bedtime as needed for allergies.       . cloNIDine (CATAPRES) 0.1 MG tablet take 1 tablet by mouth twice a day  60 tablet  5  . clotrimazole-betamethasone (LOTRISONE) cream Apply 1 application topically 2 (two) times daily as needed (dry hands).      Marland Kitchen dextromethorphan-guaiFENesin (MUCINEX DM) 30-600 MG per 12 hr tablet Take 1-2 tablets by mouth every 12 (twelve) hours as needed (cough).       . famotidine (PEPCID) 20 MG tablet Take 20 mg by mouth at bedtime as needed for heartburn. 1 tablet at bedtime      . meclizine (ANTIVERT) 50 MG tablet Take 1 tablet (50 mg total) by mouth 3 (three) times daily as needed.  12 tablet  11  . mometasone (NASONEX) 50 MCG/ACT nasal spray Place 1 spray into the nose 2 (two) times daily.       . montelukast (SINGULAIR) 10 MG tablet Take 10 mg by mouth at bedtime.      . Multiple Vitamin (MULTIVITAMIN) capsule Take 1 capsule by mouth daily.        Marland Kitchen omeprazole (PRILOSEC) 20 MG capsule Take 20 mg by mouth daily.       Marland Kitchen spironolactone-hydrochlorothiazide (ALDACTAZIDE) 25-25 MG per tablet take 2 tablets by mouth once daily  60 tablet  11  . traMADol (ULTRAM) 50 MG tablet Take 50 mg by mouth every 6 (six) hours as needed (Pain from cough).      . sodium chloride (OCEAN) 0.65 % nasal spray Place 1 spray into the nose as needed for congestion.       No current facility-administered medications for this visit.     ROS:  Pertinent items are noted in HPI.  SOCIAL HISTORY:  Retired.  Married. One daughter deceased from breast cancer age 84 years old.  One remaining daughter - 4 grandchildren.   PHYSICAL EXAMINATION:    BP 130/70  Pulse 76  Ht 4\' 11"  (1.499 m)  Wt 134 lb 8 oz (61.009 kg)  BMI 27.15 kg/m2  LMP 12/21/1999   Wt Readings from Last  3 Encounters:  02/25/14 134 lb 8 oz (61.009 kg)  02/18/14 135 lb 12.8 oz (61.598 kg)  11/19/13 136 lb (61.689 kg)     Ht Readings from Last 3 Encounters:  02/25/14 4\' 11"  (1.499 m)  02/18/14 4\' 11"  (1.499 m)  11/19/13 4' 10.25" (1.48 m)    General appearance: alert, cooperative and appears stated age Head: Normocephalic, without obvious abnormality, atraumatic Neck: no adenopathy, supple, symmetrical, trachea midline and thyroid not enlarged, symmetric, no tenderness/mass/nodules Lungs: clear to auscultation bilaterally Breasts: Inspection negative, No nipple retraction or dimpling, No nipple discharge or bleeding, No axillary or supraclavicular adenopathy, Normal to palpation without dominant masses Heart: regular rate and rhythm Abdomen: soft, non-tender; no masses,  no organomegaly Extremities: extremities normal, atraumatic, no cyanosis or edema Skin: Skin color, texture, turgor normal. No rashes or lesions Lymph nodes: Cervical, supraclavicular, and axillary nodes normal. No abnormal inguinal nodes palpated Neurologic: Grossly normal  Pelvic: External genitalia:  no lesions              Urethra:  normal appearing urethra with no masses, tenderness or lesions              Bartholins and Skenes: normal                 Vagina: normal appearing vagina with normal color and discharge, no lesions              Cervix: normal appearance              Pap and high risk HPV testing done: no.            Bimanual Exam:  Uterus:  uterus is normal size, shape, consistency and nontender                                      Adnexa: normal adnexa in size, nontender and no masses                                      Rectovaginal: Confirms                                      Anus:  normal sphincter tone, no lesions  ASSESSMENT  Normal gynecologic exam. Family history of breast cancer - daughter. Osteopenia.   PLAN  Mammogram recommended yearly.  Pap smear and high risk HPV testing not  indicated.  Counseled on self breast exam, Calcium and vitamin D intake, exercise. Bone density in 2016.  Return annually or prn   An After Visit Summary was printed and given to the patient.

## 2014-02-26 ENCOUNTER — Telehealth: Payer: Self-pay | Admitting: Internal Medicine

## 2014-02-26 NOTE — Telephone Encounter (Signed)
This prescription was to be refilled yesterday. It was printed. I have called this in. Pt is aware.

## 2014-04-16 ENCOUNTER — Encounter: Payer: Self-pay | Admitting: Internal Medicine

## 2014-04-16 ENCOUNTER — Ambulatory Visit (INDEPENDENT_AMBULATORY_CARE_PROVIDER_SITE_OTHER): Payer: Medicare Other | Admitting: Internal Medicine

## 2014-04-16 ENCOUNTER — Other Ambulatory Visit (INDEPENDENT_AMBULATORY_CARE_PROVIDER_SITE_OTHER): Payer: Medicare Other

## 2014-04-16 VITALS — BP 128/72 | HR 89 | Temp 98.4°F | Ht 59.0 in | Wt 134.2 lb

## 2014-04-16 DIAGNOSIS — J45909 Unspecified asthma, uncomplicated: Secondary | ICD-10-CM

## 2014-04-16 DIAGNOSIS — H8110 Benign paroxysmal vertigo, unspecified ear: Secondary | ICD-10-CM

## 2014-04-16 DIAGNOSIS — I10 Essential (primary) hypertension: Secondary | ICD-10-CM

## 2014-04-16 DIAGNOSIS — J309 Allergic rhinitis, unspecified: Secondary | ICD-10-CM

## 2014-04-16 LAB — BASIC METABOLIC PANEL
BUN: 15 mg/dL (ref 6–23)
CHLORIDE: 94 meq/L — AB (ref 96–112)
CO2: 30 meq/L (ref 19–32)
Calcium: 9.8 mg/dL (ref 8.4–10.5)
Creatinine, Ser: 0.8 mg/dL (ref 0.4–1.2)
GFR: 94.87 mL/min (ref >60.00)
GLUCOSE: 111 mg/dL — AB (ref 70–99)
POTASSIUM: 3.3 meq/L — AB (ref 3.5–5.1)
SODIUM: 135 meq/L (ref 135–145)

## 2014-04-16 LAB — CBC WITH DIFFERENTIAL/PLATELET
BASOS ABS: 0 10*3/uL (ref 0.0–0.1)
Basophils Relative: 0.4 % (ref 0.0–3.0)
EOS ABS: 0.4 10*3/uL (ref 0.0–0.7)
Eosinophils Relative: 4.2 % (ref 0.0–5.0)
HEMATOCRIT: 47.3 % — AB (ref 36.0–46.0)
HEMOGLOBIN: 16.1 g/dL — AB (ref 12.0–15.0)
LYMPHS ABS: 2.1 10*3/uL (ref 0.7–4.0)
Lymphocytes Relative: 23.4 % (ref 12.0–46.0)
MCHC: 34.2 g/dL (ref 30.0–36.0)
MCV: 88.6 fl (ref 78.0–100.0)
MONO ABS: 0.4 10*3/uL (ref 0.1–1.0)
MONOS PCT: 4.4 % (ref 3.0–12.0)
NEUTROS ABS: 5.9 10*3/uL (ref 1.4–7.7)
Neutrophils Relative %: 67.6 % (ref 43.0–77.0)
RBC: 5.34 Mil/uL — ABNORMAL HIGH (ref 3.87–5.11)
RDW: 13.8 % (ref 11.5–14.6)
WBC: 8.8 10*3/uL (ref 4.5–10.5)

## 2014-04-16 NOTE — Progress Notes (Signed)
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      HPI 07/28/2011 f/u ov/Cassie Campbell cc cramping new onset x sev months a couple of times a week mostly daytime. occ dry cough p symbicort and qvar  rec  Try tonic water or quinine as needed for cramps > resolved   02/12/2013 f/u ov/Cassie Campbell cc abrupt R post thigh pain 2/18 worse with wt bearing no h/o injury and no better with tylenol,  Better if stays off it, immediately worse with wt bearing with pain radiating down to mid to lower thigh from post hip.  No numbness/ weakness. rec  Try off lipitor, f/u ortho   05/16/2013 Follow up and Med review  Returns for 3 month follow up and med review.  Last visit with right hip pain, no better off statin rx.  Seen by ortho, advised DJD, tx w/ steroid injection with limited benefit. Hip better w/ rest , worse with excessive walking /exercise. Has follow up with ortho today.  We discussed water exercise programs.  rec No change rx  05/23/13 ER for refractory veritgo> resolved on antivert 50 q 8 h  And did not recur once she finished  07/31/2013 f/u ov/Cassie Campbell re asthma/hbp/ER f/u Chief Complaint  Patient presents with  . Follow-up    pt reports having some SOB x3 weeks after having cold like sx- noticed more in the AM-- states she increased symbicort to 2 puffs BID which has provided some relief    not needing any prns from action plan part of med calendar including saba or afrin rec if dizziness recurs, take antivert (meclizine) as per your action plan at bottom of calendar.  10/14/13 Cassie Campbell > laser for back rad legs > resolved   11/19/2013 f/u ov/Cassie Campbell re:  No chief complaint on file. A) Asthma with rml atx, rarely needs to use symbicort more than once daily on qvar 80 2bid  B)  HBP C) Hyperlipidemia D) chronic  Rhinitis E) Bilateral thigh pain tramadol once day mostly in am's  F) BPV, maybe once a month needs antivert >>no changes -labs ok s for low K , IgE 296  Eosinophils ok on diff , LDL at goal    02/18/2014 Follow up Asthma , HTN and Hyperlipidemia  Returns for 3 month follow up .  Pt reports she is doing well since last ov.   No new complaints; no refills needed.  Patient had lab work done at last visit. That showed LDL at goal on statin therapy Her IgE remained elevated at 296 .  rec Continue on current regimen-keep up good work  Low fat cholesterol diet  Low salt diet  Continue with exercise as tolerated.   04/16/2014 acute  ov/Cassie Campbell re: ? vertigo Chief Complaint  Patient presents with  . Acute Visit    Pt c/o vertigo x 3 days. Pt states she had several episodes of N/V/D just on saturday. Denies SOB and CP.   acute onset 04/13/14   30 min p tilting back head in beauty shop acute vertigo/n and v then d better on antivert with bilateral temporal ha/ better with aleve. Overall 90% improved since then p rx with antivert but feels weak and no appetite, mild HA bitermporal worse as day  goes on, better with aleve (not on med list). Using more proair than usual and did not increase symbicort to 160 2bid as prev rec and on med calendar as action plan  No obvious day to day or daytime variabilty or assoc chronic cough or cp or chest tightness, subjective wheeze overt sinus or hb symptoms. No unusual exp hx or h/o childhood pna/ asthma or knowledge of premature birth.  Sleeping ok without nocturnal  or early am exacerbation  of respiratory  c/o's or need for noct saba. Also denies any obvious fluctuation of symptoms with weather or environmental changes or other aggravating or alleviating factors except as outlined above   Current Medications, Allergies, Complete Past Medical History, Past Surgical History, Family History, and Social History were reviewed in  [employer] record.  ROS  The following are not active complaints unless bolded sore throat, dysphagia, dental problems, itching, sneezing,  nasal congestion or excess/ purulent secretions, ear ache,   fever, chills, sweats, unintended wt loss, pleuritic or exertional cp, hemoptysis,  orthopnea pnd or leg swelling, presyncope, palpitations, heartburn, abdominal pain, anorexia, nausea, vomiting, diarrhea  or change in bowel or urinary habits, change in stools or urine, dysuria,hematuria,  rash, arthralgias, visual complaints, headache, numbness weakness or ataxia or problems with walking or coordination,  change in mood/affect or memory.          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)  OSTEOPENIA (ICD-733.90)  - DEXA 08/22/07 AP spine + 1.1, left femur -1.3, right femur -.8  - DEXA 10/06/09 + 1.6 left femur -1.6, right femur -.5   -DEXA 2014 no change  HYPERTENSION (ICD-401.9)  HYPERLIPIDEMIA (ICD-272.4)  - Target < 130 ldl pos fm hx, hbp  OBESITY  - Target wt = 178 for BMI < 30  ASTHMA (ICD-493.90) (Cassie Campbell not active)  -Xolair s 8/05 > 10/2006  -Mastered HFA December 30, 2008  ALLERGIC RHINITIS (ICD-477.9)  S/p cervical fusion 2000 ............................................................................ Cassie Campbell...........................................................................Marland KitchenWert  - Td 09/2005  - Pneumovax 09/2003 and October 27, 2009  - Colonscopy 10/28/2004  - CPX 11/19/13     - GYN per [name]'s office -Mammogram 2013 , 2014  MED CALENDAR 05/05/11 , 05/16/2013         Objective:   Physical Exam  Wt 143 11/09/2011 >  04/12/2012  140 > 06/26/2012  139 >  11/13/2012 135 >  137 02/12/2013 >141 05/16/2013 > 07/31/2013  139 >  11/19/2013  134 >135 02/18/2014 > 04/16/2014 134   Pleasant amb bf nad  HEENT: top dentures, nl turbinates, and orophanx. Nl external ear canals without cough reflex   NECK :  without  JVD/Nodes/TM/ nl carotid upstrokes bilaterally   LUNGS: no acc muscle use, mid exp wheeze bilaterally    CV:  RRR  no s3 or murmur or increase in P2, no edema   ABD:  soft and nontender with nl excursion in the supine position. No bruits or organomegaly, bowel sounds nl  MS:  warm without deformities, calf tenderness, cyanosis or clubbing  SKIN: warm and dry without lesions    NEURO:  alert, approp, no deficits on motor/cerbellar testing, nl reflexes/ no nystagmus       cxr 11/19/13   No active cardiopulmonary disease      Assessment & Plan:

## 2014-04-16 NOTE — Patient Instructions (Addendum)
For headache ok to try tramadol 50 mg every 4 hours as needed and call if not better in a week  For any flare of wheeze or need for proair immediately increase the symbicort to 160 Take 2 puffs first thing in am and then another 2 puffs about 12 hours later until better for at least 5 days then taper back to just one puff each am  See calendar for specific medication instructions and bring it back for each and every office visit for every healthcare provider you see.  Without it,  you may not receive the best quality medical care that we feel you deserve.  You will note that the calendar groups together  your maintenance  medications that are timed at particular times of the day.  Think of this as your checklist for what your doctor has instructed you to do until your next evaluation to see what benefit  there is  to staying on a consistent group of medications intended to keep you well.  The other group at the bottom is entirely up to you to use as you see fit  for specific symptoms that may arise between visits that require you to treat them on an as needed basis.  Think of this as your action plan or "what if" list.   Separating the top medications from the bottom group is fundamental to providing you adequate care going forward.    Marland Kitchen

## 2014-04-17 ENCOUNTER — Other Ambulatory Visit: Payer: Self-pay | Admitting: Internal Medicine

## 2014-04-17 MED ORDER — POTASSIUM CHLORIDE CRYS ER 20 MEQ PO TBCR: 20.0000 meq | EXTENDED_RELEASE_TABLET | Freq: Every day | ORAL | Status: DC

## 2014-04-17 NOTE — Assessment & Plan Note (Signed)
Adequate control on present rx, reviewed > no change in rx needed   

## 2014-04-17 NOTE — Assessment & Plan Note (Signed)
  Recent Labs Lab 04/16/14 1651  NA 135  K 3.3*  CL 94*  CO2 30  BUN 15  CREATININE 0.8  GLUCOSE 111*   Mild persistent hypokalemia despite rx with spironolactone > add kdur 20 meq daily

## 2014-04-17 NOTE — Progress Notes (Signed)
Quick Note:  Spoke with pt and notified of results per Dr. [name]. Pt verbalized understanding and denied any questions.  ______ 

## 2014-04-17 NOTE — Assessment & Plan Note (Signed)
-   flare 05/23/13 > see ER eval > rx meclizine and zofran  - Flare again 04/13/14   The assoc diarrhea suggests this episode was viral and has resolved, nothing further to add at this point  Action plans reviewed on med calendar

## 2014-04-17 NOTE — Assessment & Plan Note (Signed)
ASTHMA (ICD-493.90) ([name] not active)  -Xolair rx  8/05 > 10/2006 -Mastered HFA December 30, 2008  - IgE  296 11/20/2013   Mild flare >   Each maintenance medication was reviewed in detail including most importantly the difference between maintenance and as needed and under what circumstances the prns are to be used. This was done in the context of a medication calendar review which provided the patient with a user-friendly unambiguous mechanism for medication administration and reconciliation and provides an action plan for all active problems. It is critical that this be shown to every doctor  for modification during the office visit if necessary so the patient can use it as a working document.      As prev rec, increase the symbicort to 2 bid until better x 5 d

## 2014-05-07 ENCOUNTER — Ambulatory Visit (INDEPENDENT_AMBULATORY_CARE_PROVIDER_SITE_OTHER): Payer: Medicare Other | Admitting: Internal Medicine

## 2014-05-07 ENCOUNTER — Encounter: Payer: Self-pay | Admitting: Internal Medicine

## 2014-05-07 VITALS — BP 128/70 | HR 84 | Temp 97.8°F | Ht 59.0 in | Wt 134.2 lb

## 2014-05-07 DIAGNOSIS — Z23 Encounter for immunization: Secondary | ICD-10-CM

## 2014-05-07 DIAGNOSIS — I10 Essential (primary) hypertension: Secondary | ICD-10-CM

## 2014-05-07 DIAGNOSIS — J45909 Unspecified asthma, uncomplicated: Secondary | ICD-10-CM

## 2014-05-07 DIAGNOSIS — H8110 Benign paroxysmal vertigo, unspecified ear: Secondary | ICD-10-CM

## 2014-05-07 NOTE — Progress Notes (Signed)
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      History of Present Illness   07/31/2013 f/u ov/Cassie Campbell re asthma/hbp/ER f/u Chief Complaint  Patient presents with  . Follow-up    pt reports having some SOB x3 weeks after having cold like sx- noticed more in the AM-- states she increased symbicort to 2 puffs BID which has provided some relief    not needing any prns from action plan part of med calendar including saba or afrin rec if dizziness recurs, take antivert (meclizine) as per your action plan at bottom of calendar.  10/14/13 [name] > laser for back rad legs > resolved   11/19/2013 f/u ov/Cassie Campbell re:  No chief complaint on file. A) Asthma with rml atx, rarely needs to use symbicort more than once daily on qvar 80 2bid  B) HBP C) Hyperlipidemia D) chronic  Rhinitis E) Bilateral thigh pain tramadol once day mostly in am's  F) BPV, maybe once a month needs antivert >>no changes -labs ok s for low K , IgE 296  Eosinophils ok on diff , LDL at goal    02/18/2014 Follow up Asthma , HTN and Hyperlipidemia  Returns for 3 month follow up .  Pt reports she is doing well since last ov.   No new complaints; no refills needed.  Patient had lab work done at last visit. That showed LDL at goal on statin therapy Her IgE remained elevated at 296 .  rec Continue on current regimen-keep up good work  Low fat cholesterol diet  Low salt diet  Continue with exercise as tolerated.   04/16/2014 acute  ov/Cassie Campbell re: ? vertigo Chief Complaint  Patient presents with  . Acute Visit    Pt c/o vertigo x 3 days. Pt states she had several episodes of N/V/D just on saturday. Denies SOB and CP.   acute onset 04/13/14   30 min p tilting back head in beauty shop acute vertigo/n  and v then d better on antivert with bilateral temporal ha/ better with aleve. Overall 90% improved since then p rx with antivert but feels weak and no appetite, mild HA bitermporal worse as day goes on, better with aleve (not on med list). Using more proair than usual and did not increase symbicort to 160 2bid as prev rec and on med calendar as action plan rec For headache ok to try tramadol 50 mg every 4 hours as needed and call if not better in a week For any flare of wheeze or need for proair immediately increase the symbicort to 160 Take 2 puffs first thing in am and then another 2 puffs about 12 hours later until better for at least 5 days then taper back to just one puff each am Follow med cal   05/07/2014 f/u ov/Cassie Campbell re: asthma/hbp/ha HA resolved, no longer needing tramadol, rare need for meclizine, no need for proair so tapered symbicort back to one puff each am - Not limited by breathing from desired activities      No obvious day to day or daytime variabilty or assoc chronic cough or cp or chest tightness, subjective wheeze overt sinus or hb symptoms. No unusual exp hx or h/o childhood pna/ asthma or knowledge  of premature birth.  Sleeping ok without nocturnal  or early am exacerbation  of respiratory  c/o's or need for noct saba. Also denies any obvious fluctuation of symptoms with weather or environmental changes or other aggravating or alleviating factors except as outlined above   Current Medications, Allergies, Complete Past Medical History, Past Surgical History, Family History, and Social History were reviewed in [employer] record.  ROS  The following are not active complaints unless bolded sore throat, dysphagia, dental problems, itching, sneezing,  nasal congestion or excess/ purulent secretions, ear ache,   fever, chills, sweats, unintended wt loss, pleuritic or exertional cp, hemoptysis,  orthopnea pnd or leg swelling, presyncope, palpitations,  heartburn, abdominal pain, anorexia, nausea, vomiting, diarrhea  or change in bowel or urinary habits, change in stools or urine, dysuria,hematuria,  rash, arthralgias, visual complaints, headache, numbness weakness or ataxia or problems with walking or coordination,  change in mood/affect or memory.          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 [place]...........................................................................Marland KitchenWert  - Td 09/2005  - Pneumovax 09/2003 and October 27, 2009  Prevnar 05/07/2014  - Colonscopy 10/28/2004  - CPX 11/19/13     - GYN per [name]'s office -Mammogram 2013 , 2014  MED CALENDAR 05/05/11 , 05/16/2013         Objective:   Physical Exam  Wt 143 11/09/2011 >  04/12/2012  140 > 06/26/2012  139 >  11/13/2012 135 >  137 02/12/2013 >141 05/16/2013 > 07/31/2013  139 >  11/19/2013  134 >135 02/18/2014 > 04/16/2014 134 > 05/07/2014  134   Pleasant amb bf nad  HEENT: top dentures, nl turbinates, and orophanx. Nl external ear canals without cough reflex   NECK :  without JVD/Nodes/TM/ nl carotid upstrokes bilaterally   LUNGS: no acc muscle use,no significant wheeze     CV:  RRR  no s3 or murmur or increase in P2, no edema   ABD:  soft and nontender with nl excursion in the supine position. No bruits or organomegaly, bowel sounds nl  MS:  warm without deformities, calf tenderness, cyanosis or  clubbing  SKIN: warm and dry without lesions    NEURO:  alert, approp, no deficits on motor/cerbellar testing, nl reflexes/ no nystagmus       cxr 11/19/13   No active cardiopulmonary disease      Assessment & Plan:

## 2014-05-07 NOTE — Patient Instructions (Addendum)
See calendar for specific medication instructions and bring it back for each and every office visit for every healthcare provider you see.  Without it,  you may not receive the best quality medical care that we feel you deserve.  You will note that the calendar groups together  your maintenance  medications that are timed at particular times of the day.  Think of this as your checklist for what your doctor has instructed you to do until your next evaluation to see what benefit  there is  to staying on a consistent group of medications intended to keep you well.  The other group at the bottom is entirely up to you to use as you see fit  for specific symptoms that may arise between visits that require you to treat them on an as needed basis.  Think of this as your action plan or "what if" list.   Separating the top medications from the bottom group is fundamental to providing you adequate care going forward.     See Tammy NP 3 months  with all your medications, even over the counter meds, separated in two separate bags, the ones you take no matter what vs the ones you stop once you feel better and take only as needed when you feel you need them.   Tammy  will generate for you a new user friendly medication calendar that will put us all on the same page re: your medication use.     Without this process, it simply isn't possible to assure that we are providing  your outpatient care  with  the attention to detail we feel you deserve.   If we cannot assure that you're getting that kind of care,  then we cannot manage your problem effectively from this clinic.  Once you have seen Tammy and we are sure that we're all on the same page with your medication use she will arrange follow up with me.  

## 2014-05-08 NOTE — Assessment & Plan Note (Addendum)
ASTHMA (ICD-493.90) ([name] not active)  -Xolair rx  8/05 > 10/2006 -Mastered HFA December 30, 2008  - IgE  296 11/20/2013   All goals of chronic asthma control met including optimal function and elimination of symptoms with minimal need for rescue therapy.  Contingencies discussed in full including contacting this office immediately if not controlling the symptoms using the rule of two's  Prevnar rec > given    Each maintenance medication was reviewed in detail including most importantly the difference between maintenance and as needed and under what circumstances the prns are to be used. This was done in the context of a medication calendar review which provided the patient with a user-friendly unambiguous mechanism for medication administration and reconciliation and provides an action plan for all active problems. It is critical that this be shown to every doctor  for modification during the office visit if necessary so the patient can use it as a working document.    Marland Kitchen

## 2014-05-08 NOTE — Assessment & Plan Note (Signed)
Labs ok except K 3.3 rx with 20 meq KCL > no change rx needed

## 2014-05-08 NOTE — Assessment & Plan Note (Signed)
-   flare 05/23/13 > see ER eval > rx meclizine and zofran  - Flare again 04/13/14   Reviewed prn rx of meclizine from action plan on med calendar

## 2014-07-01 ENCOUNTER — Other Ambulatory Visit: Payer: Self-pay | Admitting: Internal Medicine

## 2014-07-02 ENCOUNTER — Other Ambulatory Visit: Payer: Self-pay | Admitting: Internal Medicine

## 2014-07-05 ENCOUNTER — Other Ambulatory Visit: Payer: Self-pay | Admitting: Obstetrics & Gynecology

## 2014-07-05 ENCOUNTER — Telehealth: Payer: Self-pay | Admitting: Obstetrics and Gynecology

## 2014-07-05 MED ORDER — NYSTATIN-TRIAMCINOLONE 100000-0.1 UNIT/GM-% EX CREA: 1.0000 "application " | TOPICAL_CREAM | Freq: Two times a day (BID) | CUTANEOUS | Status: DC

## 2014-07-05 NOTE — Telephone Encounter (Signed)
Pt has a rash between her thighs. She is requesting a cream to be called into the pharmacy. Pt is aware we do not treat over the phone. Offered an appointment but patient declined. She states her spouse is very ill and she cannot leave the house today.

## 2014-07-05 NOTE — Telephone Encounter (Signed)
Patient calling with c/o redness and itching in inner thigh areas, near skin folds. Ongoing for a few days. Usually has these symptoms in summer with heat and symptoms under her breast. Has expired Mycolog cream at home and requests refill. Declines office visit as husband is ill.  Reviewed with Dr. Sabra Heck, order place. Patient aware, will call for office visit if symptoms not improved.

## 2014-07-15 ENCOUNTER — Other Ambulatory Visit: Payer: Self-pay | Admitting: Internal Medicine

## 2014-07-29 ENCOUNTER — Other Ambulatory Visit: Payer: Self-pay | Admitting: Internal Medicine

## 2014-08-06 ENCOUNTER — Ambulatory Visit (INDEPENDENT_AMBULATORY_CARE_PROVIDER_SITE_OTHER): Payer: Medicare Other | Admitting: Adult Health

## 2014-08-06 ENCOUNTER — Other Ambulatory Visit (INDEPENDENT_AMBULATORY_CARE_PROVIDER_SITE_OTHER): Payer: Medicare Other

## 2014-08-06 ENCOUNTER — Encounter: Payer: Self-pay | Admitting: Adult Health

## 2014-08-06 VITALS — BP 120/78 | HR 72 | Temp 97.5°F | Ht 59.0 in | Wt 131.6 lb

## 2014-08-06 DIAGNOSIS — K219 Gastro-esophageal reflux disease without esophagitis: Secondary | ICD-10-CM

## 2014-08-06 DIAGNOSIS — I10 Essential (primary) hypertension: Secondary | ICD-10-CM

## 2014-08-06 DIAGNOSIS — J45909 Unspecified asthma, uncomplicated: Secondary | ICD-10-CM

## 2014-08-06 LAB — BASIC METABOLIC PANEL
BUN: 9 mg/dL (ref 6–23)
CHLORIDE: 97 meq/L (ref 96–112)
CO2: 29 mEq/L (ref 19–32)
Calcium: 9.8 mg/dL (ref 8.4–10.5)
Creatinine, Ser: 0.7 mg/dL (ref 0.4–1.2)
GFR: 104.09 mL/min (ref >60.00)
Glucose, Bld: 99 mg/dL (ref 70–99)
POTASSIUM: 4.2 meq/L (ref 3.5–5.1)
SODIUM: 136 meq/L (ref 135–145)

## 2014-08-06 NOTE — Progress Notes (Signed)
Subjective:    Patient ID: Cassie Campbell, female    DOB: November 09, 1941    MRN: [id number]   Brief patient profile:  50 yobf never smoker with severe chronic asthma and previous history to suggest marked atopy with IgE level in excess of 10,000 c/w abpa  completed xolair 10/2006.      History of Present Illness   07/31/2013 f/u ov/[name] re asthma/hbp/ER f/u Chief Complaint  Patient presents with  . Follow-up    pt reports having some SOB x3 weeks after having cold like sx- noticed more in the AM-- states she increased symbicort to 2 puffs BID which has provided some relief    not needing any prns from action plan part of med calendar including saba or afrin rec if dizziness recurs, take antivert (meclizine) as per your action plan at bottom of calendar.  10/14/13 [name] > laser for back rad legs > resolved   11/19/2013 f/u ov/[name] re:  No chief complaint on file. A) Asthma with rml atx, rarely needs to use symbicort more than once daily on qvar 80 2bid  B) HBP C) Hyperlipidemia D) chronic  Rhinitis E) Bilateral thigh pain tramadol once day mostly in am's  F) BPV, maybe once a month needs antivert >>no changes -labs ok s for low K , IgE 296  Eosinophils ok on diff , LDL at goal    02/18/2014 Follow up Asthma , HTN and Hyperlipidemia  Returns for 3 month follow up .  Pt reports she is doing well since last ov.   No new complaints; no refills needed.  Patient had lab work done at last visit. That showed LDL at goal on statin therapy Her IgE remained elevated at 296 .  rec Continue on current regimen-keep up good work  Low fat cholesterol diet  Low salt diet  Continue with exercise as tolerated.   04/16/2014 acute  ov/[name] re: ? vertigo Chief Complaint  Patient presents with  . Acute Visit    Pt c/o vertigo x 3 days. Pt states she had several episodes of N/V/D just on saturday. Denies SOB and CP.   acute onset 04/13/14   30 min p tilting back head in beauty shop acute vertigo/n  and v then d better on antivert with bilateral temporal ha/ better with aleve. Overall 90% improved since then p rx with antivert but feels weak and no appetite, mild HA bitermporal worse as day goes on, better with aleve (not on med list). Using more proair than usual and did not increase symbicort to 160 2bid as prev rec and on med calendar as action plan rec For headache ok to try tramadol 50 mg every 4 hours as needed and call if not better in a week For any flare of wheeze or need for proair immediately increase the symbicort to 160 Take 2 puffs first thing in am and then another 2 puffs about 12 hours later until better for at least 5 days then taper back to just one puff each am Follow med cal   05/07/2014 f/u ov/[name] re: asthma/hbp/ha HA resolved, no longer needing tramadol, rare need for meclizine, no need for proair so tapered symbicort back to one puff each am - Not limited by breathing from desired activities     >>no changes   08/06/2014 Follow up Asthma/HTN Returns for 3 month follow up and med review.  Immunizations are utd . Due for colon this fall.  Doing well, feels good overall. Doing well with  meds.  Husband is sick , she is caring for him.  Breathing is doing well , no wheezing or cough. No nocturnal awakening.  No chest pain, orthopnea, edema , n/v/d or bloody stools.   Current Medications, Allergies, Complete Past Medical History, Past Surgical History, Family History, and Social History were reviewed in [employer] record.  ROS  The following are not active complaints unless bolded sore throat, dysphagia, dental problems, itching, sneezing,  nasal congestion or excess/ purulent secretions, ear ache,   fever, chills, sweats, unintended wt loss, pleuritic or exertional cp, hemoptysis,  orthopnea pnd or leg swelling, presyncope, palpitations, heartburn, abdominal pain, anorexia, nausea, vomiting, diarrhea  or change in bowel or urinary habits, change  in stools or urine, dysuria,hematuria,  rash, arthralgias, visual complaints, headache, numbness weakness or ataxia or problems with walking or coordination,  change in mood/affect or memory.          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 Wilkinson...........................................................................Marland KitchenWert  - Td 09/2005  - Pneumovax 09/2003 and October 27, 2009  Prevnar 05/07/2014  - Colonscopy 10/28/2004  - CPX 11/19/13     - GYN per [name]'s office -Mammogram 2013 , 2014  MED CALENDAR 05/05/11 , 05/16/2013 , 08/06/2014         Objective:   Physical Exam  Wt 143 11/09/2011 >  04/12/2012  140 > 06/26/2012  139 >  11/13/2012 135 >  137 02/12/2013 >141 05/16/2013 > 07/31/2013  139 >  11/19/2013  134 >135 02/18/2014 > 04/16/2014 134 > 05/07/2014  134 >131 08/06/2014   Pleasant amb bf nad  HEENT: top dentures, nl turbinates, and orophanx. Nl external ear canals without cough reflex   NECK :  without JVD/Nodes/TM/ nl carotid upstrokes bilaterally   LUNGS: no acc muscle use,no significant wheeze     CV:  RRR  no s3 or murmur or increase in P2, no edema   ABD:  soft and nontender with nl excursion in the supine position. No bruits or organomegaly, bowel sounds nl  MS:  warm without deformities, calf tenderness, cyanosis or clubbing  SKIN: warm and dry without lesions    NEURO:  alert, approp, no deficits  on motor/cerbellar testing, nl reflexes/ no nystagmus       cxr 11/19/13   No active cardiopulmonary disease      Assessment & Plan:

## 2014-08-06 NOTE — Patient Instructions (Signed)
Continue on current regimen-keep up good work  Follow med calendar closely and bring to each visit.  Follow up Dr. Melvyn Novas  In 4  months for physical .

## 2014-08-06 NOTE — Assessment & Plan Note (Signed)
Compensated on current regimen Check bmet

## 2014-08-06 NOTE — Assessment & Plan Note (Signed)
Compensated on current regimen. Continue on GERD diet

## 2014-08-06 NOTE — Addendum Note (Signed)
Addended by: Mathis Bud on: 08/06/2014 10:12 AM   Modules accepted: Orders

## 2014-08-06 NOTE — Assessment & Plan Note (Signed)
Compensated on present regimen.  Continue on current regimen.   

## 2014-08-07 ENCOUNTER — Encounter: Payer: Medicare Other | Admitting: Adult Health

## 2014-08-08 ENCOUNTER — Encounter: Payer: Medicare Other | Admitting: Adult Health

## 2014-08-15 NOTE — Addendum Note (Signed)
Addended by: Parke Poisson E on: 08/15/2014 12:37 PM   Modules accepted: Orders

## 2014-08-27 ENCOUNTER — Encounter: Payer: Self-pay | Admitting: Gastroenterology

## 2014-09-02 ENCOUNTER — Encounter: Payer: Self-pay | Admitting: Gastroenterology

## 2014-09-25 ENCOUNTER — Other Ambulatory Visit: Payer: Self-pay | Admitting: Internal Medicine

## 2014-09-26 NOTE — Telephone Encounter (Signed)
rx printed and faxed to pharm  

## 2014-10-15 ENCOUNTER — Emergency Department (HOSPITAL_COMMUNITY): Payer: Medicare Other

## 2014-10-15 ENCOUNTER — Encounter (HOSPITAL_COMMUNITY): Payer: Self-pay | Admitting: Emergency Medicine

## 2014-10-15 ENCOUNTER — Inpatient Hospital Stay (HOSPITAL_COMMUNITY)
Admission: EM | Admit: 2014-10-15 | Discharge: 2014-10-19 | DRG: 282 | Disposition: A | Discharge status: Home / Self Care | Payer: Medicare Other | Attending: Internal Medicine | Admitting: Internal Medicine

## 2014-10-15 DIAGNOSIS — I214 Non-ST elevation (NSTEMI) myocardial infarction: Secondary | ICD-10-CM | POA: Diagnosis not present

## 2014-10-15 DIAGNOSIS — I25111 Atherosclerotic heart disease of native coronary artery with angina pectoris with documented spasm: Secondary | ICD-10-CM

## 2014-10-15 DIAGNOSIS — E876 Hypokalemia: Secondary | ICD-10-CM | POA: Diagnosis present

## 2014-10-15 DIAGNOSIS — F329 Major depressive disorder, single episode, unspecified: Secondary | ICD-10-CM | POA: Diagnosis present

## 2014-10-15 DIAGNOSIS — I209 Angina pectoris, unspecified: Secondary | ICD-10-CM

## 2014-10-15 DIAGNOSIS — I2510 Atherosclerotic heart disease of native coronary artery without angina pectoris: Secondary | ICD-10-CM | POA: Diagnosis present

## 2014-10-15 DIAGNOSIS — Z885 Allergy status to narcotic agent status: Secondary | ICD-10-CM

## 2014-10-15 DIAGNOSIS — Z79899 Other long term (current) drug therapy: Secondary | ICD-10-CM

## 2014-10-15 DIAGNOSIS — R072 Precordial pain: Secondary | ICD-10-CM

## 2014-10-15 DIAGNOSIS — R079 Chest pain, unspecified: Secondary | ICD-10-CM | POA: Diagnosis not present

## 2014-10-15 DIAGNOSIS — E669 Obesity, unspecified: Secondary | ICD-10-CM | POA: Diagnosis present

## 2014-10-15 DIAGNOSIS — K219 Gastro-esophageal reflux disease without esophagitis: Secondary | ICD-10-CM | POA: Diagnosis present

## 2014-10-15 DIAGNOSIS — I2584 Coronary atherosclerosis due to calcified coronary lesion: Secondary | ICD-10-CM | POA: Diagnosis present

## 2014-10-15 DIAGNOSIS — E785 Hyperlipidemia, unspecified: Secondary | ICD-10-CM | POA: Diagnosis present

## 2014-10-15 DIAGNOSIS — I517 Cardiomegaly: Secondary | ICD-10-CM

## 2014-10-15 DIAGNOSIS — I25110 Atherosclerotic heart disease of native coronary artery with unstable angina pectoris: Secondary | ICD-10-CM | POA: Diagnosis present

## 2014-10-15 DIAGNOSIS — Z880 Allergy status to penicillin: Secondary | ICD-10-CM

## 2014-10-15 DIAGNOSIS — Z0000 Encounter for general adult medical examination without abnormal findings: Secondary | ICD-10-CM

## 2014-10-15 DIAGNOSIS — Z886 Allergy status to analgesic agent status: Secondary | ICD-10-CM

## 2014-10-15 DIAGNOSIS — B372 Candidiasis of skin and nail: Secondary | ICD-10-CM

## 2014-10-15 DIAGNOSIS — Z888 Allergy status to other drugs, medicaments and biological substances status: Secondary | ICD-10-CM

## 2014-10-15 DIAGNOSIS — J45909 Unspecified asthma, uncomplicated: Secondary | ICD-10-CM | POA: Diagnosis present

## 2014-10-15 DIAGNOSIS — Z6826 Body mass index (BMI) 26.0-26.9, adult: Secondary | ICD-10-CM

## 2014-10-15 DIAGNOSIS — I10 Essential (primary) hypertension: Secondary | ICD-10-CM | POA: Diagnosis present

## 2014-10-15 HISTORY — DX: Other specified postprocedural states: R11.2

## 2014-10-15 HISTORY — DX: Atherosclerotic heart disease of native coronary artery without angina pectoris: I25.10

## 2014-10-15 HISTORY — DX: Other specified postprocedural states: Z98.890

## 2014-10-15 LAB — CBC WITH DIFFERENTIAL/PLATELET
BASOS PCT: 0 % (ref 0–1)
Basophils Absolute: 0 10*3/uL (ref 0.0–0.1)
EOS PCT: 2 % (ref 0–5)
Eosinophils Absolute: 0.1 10*3/uL (ref 0.0–0.7)
HEMATOCRIT: 44.8 % (ref 36.0–46.0)
Hemoglobin: 16 g/dL — ABNORMAL HIGH (ref 12.0–15.0)
Lymphocytes Relative: 18 % (ref 12–46)
Lymphs Abs: 1.3 10*3/uL (ref 0.7–4.0)
MCH: 30.6 pg (ref 26.0–34.0)
MCHC: 35.7 g/dL (ref 30.0–36.0)
MCV: 85.7 fL (ref 78.0–100.0)
MONO ABS: 0.5 10*3/uL (ref 0.1–1.0)
Monocytes Relative: 7 % (ref 3–12)
Neutro Abs: 5.4 10*3/uL (ref 1.7–7.7)
Neutrophils Relative %: 73 % (ref 43–77)
Platelets: 384 10*3/uL (ref 150–400)
RBC: 5.23 MIL/uL — ABNORMAL HIGH (ref 3.87–5.11)
RDW: 13.5 % (ref 11.5–15.5)
WBC: 7.4 10*3/uL (ref 4.0–10.5)

## 2014-10-15 LAB — CBC
HCT: 40.9 % (ref 36.0–46.0)
Hemoglobin: 14.4 g/dL (ref 12.0–15.0)
MCH: 30.1 pg (ref 26.0–34.0)
MCHC: 35.2 g/dL (ref 30.0–36.0)
MCV: 85.6 fL (ref 78.0–100.0)
Platelets: 357 10*3/uL (ref 150–400)
RBC: 4.78 MIL/uL (ref 3.87–5.11)
RDW: 13.4 % (ref 11.5–15.5)
WBC: 7.4 10*3/uL (ref 4.0–10.5)

## 2014-10-15 LAB — PROTIME-INR
INR: 1.06 (ref 0.00–1.49)
Prothrombin Time: 14 seconds (ref 11.6–15.2)

## 2014-10-15 LAB — CREATININE, SERUM
CREATININE: 0.57 mg/dL (ref 0.50–1.10)
GFR calc Af Amer: >90 mL/min (ref >90)

## 2014-10-15 LAB — I-STAT CHEM 8, ED
BUN: 10 mg/dL (ref 6–23)
CALCIUM ION: 1.13 mmol/L (ref 1.13–1.30)
CHLORIDE: 100 meq/L (ref 96–112)
Creatinine, Ser: 0.6 mg/dL (ref 0.50–1.10)
GLUCOSE: 112 mg/dL — AB (ref 70–99)
HCT: 50 % — ABNORMAL HIGH (ref 36.0–46.0)
Hemoglobin: 17 g/dL — ABNORMAL HIGH (ref 12.0–15.0)
Potassium: 3.9 mEq/L (ref 3.7–5.3)
Sodium: 137 mEq/L (ref 137–147)
TCO2: 29 mmol/L (ref 0–100)

## 2014-10-15 LAB — I-STAT TROPONIN, ED: TROPONIN I, POC: 0.01 ng/mL (ref 0.00–0.08)

## 2014-10-15 LAB — TROPONIN I
Troponin I: 0.33 ng/mL (ref <0.30)
Troponin I: 2.39 ng/mL (ref <0.30)

## 2014-10-15 LAB — APTT: aPTT: 29 seconds (ref 24–37)

## 2014-10-15 MED ORDER — CLOPIDOGREL BISULFATE 75 MG PO TABS
75.0000 mg | ORAL_TABLET | Freq: Once | ORAL | Status: AC
  Administered 2014-10-15: 75 mg via ORAL
  Filled 2014-10-15: qty 1

## 2014-10-15 MED ORDER — NITROGLYCERIN 0.4 MG SL SUBL: 0.4000 mg | SUBLINGUAL_TABLET | SUBLINGUAL | Status: DC | PRN

## 2014-10-15 MED ORDER — SODIUM CHLORIDE 0.9 % IV SOLN: 250.0000 mL | INTRAVENOUS | Status: DC | PRN

## 2014-10-15 MED ORDER — HEPARIN SODIUM (PORCINE) 5000 UNIT/ML IJ SOLN
5000.0000 [IU] | Freq: Three times a day (TID) | INTRAMUSCULAR | Status: DC
  Administered 2014-10-15: 5000 [IU] via SUBCUTANEOUS
  Filled 2014-10-15 (×4): qty 1

## 2014-10-15 MED ORDER — SODIUM CHLORIDE 0.9 % IJ SOLN: 3.0000 mL | INTRAMUSCULAR | Status: DC | PRN

## 2014-10-15 MED ORDER — ATORVASTATIN CALCIUM 10 MG PO TABS
10.0000 mg | ORAL_TABLET | Freq: Every day | ORAL | Status: DC
  Administered 2014-10-15: 10 mg via ORAL
  Filled 2014-10-15 (×2): qty 1

## 2014-10-15 MED ORDER — SODIUM CHLORIDE 0.9 % IV SOLN
INTRAVENOUS | Status: DC
  Administered 2014-10-15: 13:00:00 via INTRAVENOUS

## 2014-10-15 MED ORDER — ONDANSETRON HCL 4 MG/2ML IJ SOLN
4.0000 mg | Freq: Four times a day (QID) | INTRAMUSCULAR | Status: DC | PRN
  Administered 2014-10-15 – 2014-10-18 (×4): 4 mg via INTRAVENOUS
  Filled 2014-10-15 (×4): qty 2

## 2014-10-15 MED ORDER — MORPHINE SULFATE 2 MG/ML IJ SOLN
2.0000 mg | INTRAMUSCULAR | Status: DC | PRN
  Administered 2014-10-15 – 2014-10-17 (×2): 2 mg via INTRAVENOUS
  Filled 2014-10-15 (×2): qty 1

## 2014-10-15 MED ORDER — SODIUM CHLORIDE 0.9 % IJ SOLN: 3.0000 mL | Freq: Two times a day (BID) | INTRAMUSCULAR | Status: DC

## 2014-10-15 MED ORDER — NITROGLYCERIN 2 % TD OINT
1.0000 [in_us] | TOPICAL_OINTMENT | Freq: Once | TRANSDERMAL | Status: AC
  Administered 2014-10-15: 1 [in_us] via TOPICAL
  Filled 2014-10-15: qty 30

## 2014-10-15 MED ORDER — HEPARIN (PORCINE) IN NACL 100-0.45 UNIT/ML-% IJ SOLN
700.0000 [IU]/h | INTRAMUSCULAR | Status: DC
  Administered 2014-10-15: 700 [IU]/h via INTRAVENOUS
  Filled 2014-10-15: qty 250

## 2014-10-15 MED ORDER — SPIRONOLACTONE-HCTZ 25-25 MG PO TABS
2.0000 | ORAL_TABLET | Freq: Every day | ORAL | Status: DC
  Filled 2014-10-15: qty 2

## 2014-10-15 MED ORDER — PANTOPRAZOLE SODIUM 40 MG PO TBEC
40.0000 mg | DELAYED_RELEASE_TABLET | Freq: Every day | ORAL | Status: DC
  Administered 2014-10-15 – 2014-10-19 (×5): 40 mg via ORAL
  Filled 2014-10-15 (×4): qty 1

## 2014-10-15 MED ORDER — AMLODIPINE BESYLATE 10 MG PO TABS
10.0000 mg | ORAL_TABLET | Freq: Every day | ORAL | Status: DC
  Administered 2014-10-15: 10 mg via ORAL
  Filled 2014-10-15 (×2): qty 1

## 2014-10-15 MED ORDER — ACETAMINOPHEN 650 MG RE SUPP: 650.0000 mg | Freq: Four times a day (QID) | RECTAL | Status: DC | PRN

## 2014-10-15 MED ORDER — SPIRONOLACTONE 25 MG PO TABS
25.0000 mg | ORAL_TABLET | Freq: Every day | ORAL | Status: DC
  Administered 2014-10-15: 25 mg via ORAL
  Filled 2014-10-15 (×2): qty 1

## 2014-10-15 MED ORDER — CLONIDINE HCL 0.1 MG PO TABS
0.1000 mg | ORAL_TABLET | Freq: Two times a day (BID) | ORAL | Status: DC
  Administered 2014-10-15 (×2): 0.1 mg via ORAL
  Filled 2014-10-15 (×4): qty 1

## 2014-10-15 MED ORDER — SODIUM CHLORIDE 0.9 % IV SOLN
Freq: Once | INTRAVENOUS | Status: AC
  Administered 2014-10-15: 09:00:00 via INTRAVENOUS

## 2014-10-15 MED ORDER — HYDROCHLOROTHIAZIDE 25 MG PO TABS
25.0000 mg | ORAL_TABLET | Freq: Every day | ORAL | Status: DC
  Administered 2014-10-15: 25 mg via ORAL
  Filled 2014-10-15 (×2): qty 1

## 2014-10-15 MED ORDER — SODIUM CHLORIDE 0.9 % IJ SOLN
3.0000 mL | Freq: Two times a day (BID) | INTRAMUSCULAR | Status: DC
  Administered 2014-10-18: 23:00:00 3 mL via INTRAVENOUS

## 2014-10-15 MED ORDER — ALBUTEROL SULFATE HFA 108 (90 BASE) MCG/ACT IN AERS: 1.0000 | INHALATION_SPRAY | RESPIRATORY_TRACT | Status: DC | PRN

## 2014-10-15 MED ORDER — NITROGLYCERIN 0.4 MG SL SUBL
0.4000 mg | SUBLINGUAL_TABLET | SUBLINGUAL | Status: DC | PRN
  Administered 2014-10-15: 0.4 mg via SUBLINGUAL
  Filled 2014-10-15: qty 1

## 2014-10-15 MED ORDER — ACETAMINOPHEN 325 MG PO TABS
650.0000 mg | ORAL_TABLET | Freq: Four times a day (QID) | ORAL | Status: DC | PRN
  Administered 2014-10-15 – 2014-10-16 (×2): 650 mg via ORAL
  Filled 2014-10-15 (×2): qty 2

## 2014-10-15 MED ORDER — SODIUM CHLORIDE 0.9 % IV SOLN
INTRAVENOUS | Status: DC
  Administered 2014-10-16: 01:00:00 via INTRAVENOUS

## 2014-10-15 MED ORDER — TRAMADOL HCL 50 MG PO TABS: 50.0000 mg | ORAL_TABLET | Freq: Four times a day (QID) | ORAL | Status: DC | PRN

## 2014-10-15 NOTE — Progress Notes (Signed)
Per cardiology PA, RN to arrange transport to cath lab for procedure around 2 pm- 2:30 pm. Called [facility] to arrange transport who instructed RN to call cath lab in AM to verify time of procedure and arrange transport at that time.

## 2014-10-15 NOTE — Consult Note (Signed)
CARDIOLOGY CONSULT NOTE   Patient ID: Cassie Campbell MRN: [id number], DOB/AGE: 72-Jan-1942   Admit date: 10/15/2014 Date of Consult: 10/15/2014   Primary Physician: Christinia Gully, MD Primary Cardiologist: Dr. Angelena Form  Pt. Profile  73 year old female with past medical history of hypertension, hyperlipidemia, obesity, severe chronic asthma, depression, GERD and positional vertigo presented with trop and borderline elevated trop  Problem List  Past Medical History  Diagnosis Date  . Benign positional vertigo   . Osteopenia     dexa 08/22/07 AP spine + 1.1, left femur -1.3, right femur -.8; dexa 10/06/09 +1.6, left femur =1.6, right femur -.  . Hypertension   . Hyperlipidemia     <130 ldl pos fm hx, bp  . Obesity   . Asthma     xolair s 8/05 ?11/07; mastered hfa 12/20/08  . Allergic rhinitis   . Ruptured disc, cervical   . Depression   . PONV (postoperative nausea and vomiting)     Past Surgical History  Procedure Laterality Date  . Cervical disc surgery  12/01  . Vein ligation and stripping    . Vulva /perineum biopsy  12-30-10    --epidermoid cyst  . Breast surgery  10-26-10    Rt. breast bx--for microcalcifications in rt. retroareolar region--dx was hyalinized fibroadenoma  . Bunionectomy Bilateral      Allergies  Allergies  Allergen Reactions  . Codeine Phosphate Nausea Only  . Aspirin Swelling and Rash  . Diphenhydramine Hcl Rash  . Fish-Derived Products Swelling and Rash  . Penicillins Swelling and Rash    HPI   The patient is a 73 year old female with past medical history of hypertension, hyperlipidemia, obesity, severe chronic asthma, depression, GERD and positional vertigo. She denies any prior cardiac history and has never seen a cardiologist before. She follows with Dr. Melvyn Novas Pulmonology for severe chronic asthma. She denies any recent asthma exacerbation. She states she frequently walk to the nearest supermarket and walk 2 laps around the supermarket for  exercise. She denies any excessively strenuous activity due to her asthma history. The last time she exercised was last Thursday, however she denies any exertional chest discomfort at the time. She denies recent fever, chill, or significant cough. However she does endorse a chronic cough that is related to her asthma. She denies any history of blood clot or aortic dissection. She denies any recent decline in her functional status or has she experienced any lower extremity edema, orthopnea or paroxysmal nocturnal dyspnea.  She went to bed on the night of 10/14/2014 feeling well. Around 3 AM, she woke up with 8 out of 10 substernal chest pressure radiating to the back and L shoulder. She attempted to take Zantac thinking it was related to her GERD, however it did not provide any relief. The chest pain waxed and waned however persisted even after she arrived at [facility]. On arrival, her blood pressure was 127/74. O2 saturation 100% on room air. Heart rate 50 to 60s. Initial CBC and BMET were negative. Initial troponin was negative as well. However repeat troponin I was positive at 0.33. Chest x-ray showed right base infiltrate. EKG showed normal sinus rhythm with heart rate 50s, T wave inversion in lead V3 through V6 and lateral leads as well. Cardiology has been consulted for chest pain. At this time, she still has 2/10 chest pain after nitro patch and morphine.   Inpatient Medications  . amLODipine  10 mg Oral Daily  . atorvastatin  10 mg Oral  q1800  . cloNIDine  0.1 mg Oral BID  . heparin  5,000 Units Subcutaneous 3 times per day  . spironolactone  25 mg Oral Daily   And  . hydrochlorothiazide  25 mg Oral Daily  . pantoprazole  40 mg Oral Daily  . sodium chloride  3 mL Intravenous Q12H    Family History Family History  Problem Relation Age of Onset  . Diabetes Mother     AODM  . Stroke Mother   . Hypertension Mother   . Heart disease Father   . Breast cancer Daughter 36     dec--mets to liver/spine  . Diabetes Sister     AODM  . Breast cancer Sister   . Hypertension Sister      Social History History   Social History  . Marital Status: Married    Spouse Name: N/A    Number of Children: N/A  . Years of Education: N/A   Occupational History  . retired Tour manager    Social History Main Topics  . Smoking status: Never Smoker   . Smokeless tobacco: Never Used  . Alcohol Use: No  . Drug Use: No  . Sexual Activity: No   Other Topics Concern  . Not on file   Social History Narrative  . No narrative on file     Review of Systems  General:  No chills, fever, night sweats or weight changes.  Cardiovascular:  No edema, orthopnea, palpitations, paroxysmal nocturnal dyspnea. +chest pain. Chronic mild dyspnea on exertion, no change, flushing sensation Dermatological: No rash, lesions/masses Respiratory: chronic cough Urologic: No hematuria, dysuria Abdominal:   No vomiting, diarrhea, bright red blood per rectum, melena, or hematemesis. + nausea Neurologic:  No visual changes, wkns, changes in mental status. All other systems reviewed and are otherwise negative except as noted above.  Physical Exam  Blood pressure 112/68, pulse 58, temperature 97.8 F (36.6 C), temperature source Oral, resp. rate 16, height 4\' 11"  (1.499 m), weight 131 lb (59.421 kg), last menstrual period 12/21/1999, SpO2 97.00%.  General: Pleasant, NAD Psych: Normal affect. Neuro: Alert and oriented X 3. Moves all extremities spontaneously. HEENT: Normal  Neck: Supple without bruits or JVD. Lungs:  Resp regular and unlabored. markedly decreased breath sound in all lung field, mild exp wheezing Heart: RRR no s3, s4, or murmurs. Abdomen: Soft, non-tender, non-distended, BS + x 4.  Extremities: No clubbing, cyanosis or edema. DP/PT/Radials 2+ and equal bilaterally.  Labs   Recent Labs  10/15/14 1145  TROPONINI 0.33*   Lab Results  Component Value Date   WBC  7.4 10/15/2014   HGB 14.4 10/15/2014   HCT 40.9 10/15/2014   MCV 85.6 10/15/2014   PLT 357 10/15/2014    Recent Labs Lab 10/15/14 0845 10/15/14 1145  NA 137  --   K 3.9  --   CL 100  --   BUN 10  --   CREATININE 0.60 0.57  GLUCOSE 112*  --    Lab Results  Component Value Date   CHOL 176 11/19/2013   HDL 47.30 11/19/2013   LDLCALC 109* 11/19/2013   TRIG 97.0 11/19/2013   No results found for this basename: DDIMER    Radiology/Studies  Dg Chest Port 1 View  10/15/2014   CLINICAL DATA:  One day history of chest pain; history of asthma and hypertension  EXAM: PORTABLE CHEST - 1 VIEW  COMPARISON:  November 19, 2013  FINDINGS: There is a small area of infiltrate in the  right base region. Elsewhere lungs are clear. Heart size and pulmonary vascularity are normal. No pneumothorax. No adenopathy. There is postoperative change in the lower cervical spine region.  IMPRESSION: Infiltrate right base.   Electronically Signed   By: Lowella Grip M.D.   On: 10/15/2014 08:52    ECG  EKG showed normal sinus rhythm with heart rate 50s, T wave inversion in lead V3 through V6 and lateral leads as well. Cardiology has been consulted for chest pain.  ASSESSMENT AND PLAN  1. Chest pain concerning for unstable angina  - trop 0.33, TWI in lead V3-V6 and lateral leads  - continue to trend trop. Add IV heparin  - although asthma exacerbation/PNA can also cause elevated trop, however per patient, she has not experienced obvious sign of asthma exacerbation. Low suspicion for PE given HR 50s and O2 sat 99-100. Concerned of angina pain  - cardiac risk factor: HTN, HLD, Obesity. Concerned of [place]  - will discuss with Dr. Angelena Form regarding treatment, cath vs stress test.   2. R basilar infiltrate: per primary team. No obvious fever or chill to indicate PNA  3. Hypertension 4. Hyperlipidemia 5. Obesity 6. severe chronic asthma 7. Depression 8. GERD  9. positional vertigo  Signed, Almyra Deforest,  PA-C 10/15/2014, 2:19 PM  I have personally seen and examined this patient with Almyra Deforest, PA-C. I agree with the assessment and plan as outlined above. My exam shows an alert patient, no JVD, scattered wheezes bilaterally, RRR with no murmurs, no edema. Labs reviewed by me. EKG reviewed by me with T wave inversions laterally, anterolaterally. No ST elevation. She is admitted with unstable angina/NSTEMI. Chest pain now controlled with NTG paste. Troponin elevated. Cardiac cath is indicated. Will start IV heparin. Continue NTG paste. ASA allergy. Continue Plavix. No beta blocker with bradycardia. Cath at 2 pm tomorrow at [facility]. I have reviewed the risks and benefits of cath with the patient including bleeding, infection, CVA, MI, contrast nephropathy, death. She agrees to proceed. NPO after clear liquid breakfast.   [name] Hockey 10/15/2014 5:13 PM

## 2014-10-15 NOTE — Progress Notes (Addendum)
ANTICOAGULATION CONSULT NOTE - Initial Consult  Pharmacy Consult for Heparin Indication: chest pain/ACS  Allergies  Allergen Reactions  . Codeine Phosphate Nausea Only  . Aspirin Swelling and Rash  . Diphenhydramine Hcl Rash  . Fish-Derived Products Swelling and Rash  . Penicillins Swelling and Rash    Patient Measurements: Height: 4\' 11"  (149.9 cm) Weight: 131 lb (59.421 kg) IBW/kg (Calculated) : 43.2 Heparin Dosing Weight: 55.6 kg  Vital Signs: Temp: 97.8 F (36.6 C) (10/27 1235) Temp Source: Oral (10/27 1235) BP: 119/67 mmHg (10/27 1444) Pulse Rate: 60 (10/27 1444)  Labs:  Recent Labs  10/15/14 0833 10/15/14 0845 10/15/14 1145  HGB 16.0* 17.0* 14.4  HCT 44.8 50.0* 40.9  PLT 384  --  357  CREATININE  --  0.60 0.57  TROPONINI  --   --  0.33*    Estimated Creatinine Clearance: 49.9 ml/min (by C-G formula based on Cr of 0.57).   Medical History: Past Medical History  Diagnosis Date  . Benign positional vertigo   . Osteopenia     dexa 08/22/07 AP spine + 1.1, left femur -1.3, right femur -.8; dexa 10/06/09 +1.6, left femur =1.6, right femur -.  . Hypertension   . Hyperlipidemia     <130 ldl pos fm hx, bp  . Obesity   . Asthma     xolair s 8/05 ?11/07; mastered hfa 12/20/08  . Allergic rhinitis   . Ruptured disc, cervical   . Depression   . PONV (postoperative nausea and vomiting)      Assessment: 46 yoF with PMH of HTN, HLD, obesity presented with chest pain concerning for unstable angina and borderline elevated troponin.  Cardiology has seen the patient and is recommending IV heparin and cardiac cath.  Starting heparin this evening with plans for cath tomorrow afternoon.  Patient received Plavix (ASA allergy) and NTG paste today.  Patient received heparin SQ 5000 units at 1533 today.  CBC WNL. CrCl~59 ml/min Baseline aPTT and INR ordered.  Goal of Therapy:  Heparin level 0.3-0.7 units/ml Monitor platelets by anticoagulation protocol: Yes   Plan:   1.  Will not bolus heparin due to recent SQ heparin dose.  Start heparin infusion at 700 units/hr. 2.  Check heparin level 8 hours following start of infusion. 3.  Daily HL and CBC while on heparin.  Hershal Coria 10/15/2014,5:26 PM

## 2014-10-15 NOTE — Progress Notes (Signed)
Echocardiogram 2D Echocardiogram has been performed.  Cassie Campbell 10/15/2014, 3:03 PM

## 2014-10-15 NOTE — ED Notes (Signed)
Per pt, chest pain starting at 3-4 am.  Pain radiates to left shoulder.  No increase in belching or nausea.  Pt has hx of htn.  No increase in cough/congestion.  EKG performed in triage.

## 2014-10-15 NOTE — Progress Notes (Signed)
CRITICAL VALUE ALERT  Critical value received: trop 2.39  Date of notification:  10/15/2014  Time of notification:  1934  Critical value read back:Yes.    Nurse who received alert:  Edilia Bo  MD notified (1st page):  Lamar Blinks, NP   Time of first page:  1943  MD notified (2nd page):  Dr. Radford Pax  Time of second page: 2136  Responding MD:  Dr. Radford Pax   Time MD responded:  2136   No new orders received will continue to monitor pt

## 2014-10-15 NOTE — H&P (Signed)
[facility] History and Physical  Cassie Campbell YBO:[id number] DOB: 01-Jan-1941 DOA: 10/15/2014  Referring physician: Emergency Department PCP: Christinia Gully, MD  Specialists:   Chief Complaint: Chest pain  HPI: Cassie Campbell is a 73 y.o. female  With a hx of HTN, HLD, gerd who presents to the ED with complaints of chest pain associated with nausea and feeling flushed overnight with no improvement with zantac and ginger ale. The patient then presented to the ED where ekg was found to be unremarkable. Initial trop was neg. On cxr, a small infiltrate was noted at the right base. Pt denies coughing. Reports feeling at baseline prior to going to bed. Does note some reproduction of chest pain with palpation. Hospitalist consulted for consideration for admission.  Review of Systems: Per above, the remainder of the 10pt ros reviewed and are neg  Past Medical History  Diagnosis Date  . Benign positional vertigo   . Osteopenia     dexa 08/22/07 AP spine + 1.1, left femur -1.3, right femur -.8; dexa 10/06/09 +1.6, left femur =1.6, right femur -.  . Hypertension   . Hyperlipidemia     <130 ldl pos fm hx, bp  . Obesity   . Asthma     xolair s 8/05 ?11/07; mastered hfa 12/20/08  . Allergic rhinitis   . Ruptured disc, cervical   . Depression   . PONV (postoperative nausea and vomiting)    Past Surgical History  Procedure Laterality Date  . Cervical disc surgery  12/01  . Vein ligation and stripping    . Vulva /perineum biopsy  12-30-10    --epidermoid cyst  . Breast surgery  10-26-10    Rt. breast bx--for microcalcifications in rt. retroareolar region--dx was hyalinized fibroadenoma  . Bunionectomy Bilateral    Social History:  reports that she has never smoked. She has never used smokeless tobacco. She reports that she does not drink alcohol or use illicit drugs.  where does patient live--home, ALF, SNF? and with whom if at home?  Can patient participate in ADLs?  Allergies   Allergen Reactions  . Codeine Phosphate Nausea Only  . Aspirin Swelling and Rash  . Diphenhydramine Hcl Rash  . Fish-Derived Products Swelling and Rash  . Penicillins Swelling and Rash    Family History  Problem Relation Age of Onset  . Diabetes Mother     AODM  . Stroke Mother   . Hypertension Mother   . Heart disease Father   . Breast cancer Daughter 36    dec--mets to liver/spine  . Diabetes Sister     AODM  . Breast cancer Sister   . Hypertension Sister     (be sure to complete)  Prior to Admission medications   Medication Sig Start Date End Date Taking? Authorizing Provider  albuterol (PROAIR HFA) 108 (90 BASE) MCG/ACT inhaler Inhale 1-2 puffs into the lungs every 4 (four) hours as needed for wheezing.    Yes Historical Provider, MD  amLODipine (NORVASC) 10 MG tablet take 1 tablet by mouth once daily 11/27/13  Yes Tanda Rockers, MD  atorvastatin (LIPITOR) 20 MG tablet take 1/2 tablet by mouth once daily 07/01/14  Yes Tanda Rockers, MD  beclomethasone (QVAR) 80 MCG/ACT inhaler Inhale 2 puffs into the lungs 2 (two) times daily.    Yes Historical Provider, MD  budesonide-formoterol (SYMBICORT) 160-4.5 MCG/ACT inhaler Inhale 2 puffs into the lungs daily.  12/04/12  Yes Tanda Rockers, MD  Calcium Carbonate-Vit D-Min 1200-1000  MG-UNIT CHEW Chew 1 tablet by mouth daily.    Yes Historical Provider, MD  cetirizine (ZYRTEC) 10 MG tablet Take 10 mg by mouth at bedtime as needed for allergies.    Yes Historical Provider, MD  cloNIDine (CATAPRES) 0.1 MG tablet take 1 tablet by mouth twice a day 07/02/14  Yes Tanda Rockers, MD  clotrimazole-betamethasone (LOTRISONE) cream Apply 1 application topically 2 (two) times daily as needed (itching).    Yes Historical Provider, MD  dextromethorphan-guaiFENesin (MUCINEX DM) 30-600 MG per 12 hr tablet Take 1-2 tablets by mouth every 12 (twelve) hours as needed (cough).    Yes Historical Provider, MD  famotidine (PEPCID) 20 MG tablet Take 20 mg by  mouth at bedtime.    Yes Historical Provider, MD  meclizine (ANTIVERT) 50 MG tablet Take 25-50 mg by mouth 3 (three) times daily as needed for dizziness.  07/31/13  Yes Tanda Rockers, MD  mometasone (NASONEX) 50 MCG/ACT nasal spray Place 1-2 sprays into the nose 2 (two) times daily as needed (allergies).    Yes Historical Provider, MD  montelukast (SINGULAIR) 10 MG tablet Take 10 mg by mouth at bedtime.   Yes Historical Provider, MD  Multiple Vitamin (MULTIVITAMIN) capsule Take 1 capsule by mouth daily.     Yes Historical Provider, MD  omeprazole (PRILOSEC) 20 MG capsule Take 20 mg by mouth daily.    Yes Historical Provider, MD  oxymetazoline (AFRIN) 0.05 % nasal spray Place 2 sprays into both nostrils every 12 (twelve) hours as needed for congestion.    Yes Historical Provider, MD  potassium chloride SA (K-DUR,KLOR-CON) 20 MEQ tablet take 1 tablet by mouth once daily 07/15/14  Yes Tanda Rockers, MD  sodium chloride (OCEAN) 0.65 % nasal spray Place 1 spray into the nose every 4 (four) hours as needed for congestion.    Yes Historical Provider, MD  spironolactone-hydrochlorothiazide (ALDACTAZIDE) 25-25 MG per tablet take 2 tablets by mouth once daily 11/19/13  Yes Tanda Rockers, MD  traMADol (ULTRAM) 50 MG tablet take 1 to 2 tablets by mouth every 6 to 8 hours if needed for pain 09/26/14  Yes Tanda Rockers, MD   Physical Exam: Filed Vitals:   10/15/14 0756 10/15/14 0848 10/15/14 0905  BP: 127/74 151/87 138/68  Pulse: 67 55 59  Temp: 97.5 F (36.4 C)    TempSrc: Oral    Resp: 18 12 9   Height: 4\' 11"  (1.499 m)    Weight: 59.421 kg (131 lb)    SpO2: 100% 98% 97%     General:  Awake, in nad  Eyes: PERRL B  ENT: Membranes moist, dentition fair  Neck: trachea midline, neck supple  Cardiovascular: regular, s1, s2  Respiratory: normal resp effort, no wheezing  Abdomen: soft,nondistended  Skin: normal skin turgor, no abnormal skin lesions seen  Musculoskeletal: perfused, no  clubbing  Psychiatric: mood/affect normal// no auditory/visual hallucinations  Neurologic: cn2-12 grossly intact, strength/sensation intact  Labs on Admission:  Basic Metabolic Panel:  Recent Labs Lab 10/15/14 0845  NA 137  K 3.9  CL 100  GLUCOSE 112*  BUN 10  CREATININE 0.60   Liver Function Tests: No results found for this basename: AST, ALT, ALKPHOS, BILITOT, PROT, ALBUMIN,  in the last 168 hours No results found for this basename: LIPASE, AMYLASE,  in the last 168 hours No results found for this basename: AMMONIA,  in the last 168 hours CBC:  Recent Labs Lab 10/15/14 0833 10/15/14 0845  WBC 7.4  --  NEUTROABS 5.4  --   HGB 16.0* 17.0*  HCT 44.8 50.0*  MCV 85.7  --   PLT 384  --    Cardiac Enzymes: No results found for this basename: CKTOTAL, CKMB, CKMBINDEX, TROPONINI,  in the last 168 hours  BNP (last 3 results) No results found for this basename: PROBNP,  in the last 8760 hours CBG: No results found for this basename: GLUCAP,  in the last 168 hours  Radiological Exams on Admission: Dg Chest Port 1 View  10/15/2014   CLINICAL DATA:  One day history of chest pain; history of asthma and hypertension  EXAM: PORTABLE CHEST - 1 VIEW  COMPARISON:  November 19, 2013  FINDINGS: There is a small area of infiltrate in the right base region. Elsewhere lungs are clear. Heart size and pulmonary vascularity are normal. No pneumothorax. No adenopathy. There is postoperative change in the lower cervical spine region.  IMPRESSION: Infiltrate right base.   Electronically Signed   By: Lowella Grip M.D.   On: 10/15/2014 08:52    EKG: Independently reviewed. NSR  Assessment/Plan Active Problems:   Essential hypertension   GERD (gastroesophageal reflux disease)   1. Chest pain 1. HEART of 5 2. Currently pain free with nitro patch in place 3. Will cycle troponin, check 2d echo, and repeat ekg in am 4. Consider Cardiology consultation 5. Admit to  med-tele 2. HTN 1. Stable, albeit suboptimal 2. Cont home meds for now 3. HLD 1. Cont home statin 4. GERD 1. Will cont on PPI daily 5. DVT prophylaxis 1. Heparin subQ  Code Status: full  Family Communication: Pt in room  Disposition Plan: Pending   Time spent: 80min  Mollee Neer, [facility] Pager [phone]  If 7PM-7AM, please contact night-coverage [email or web address] Password [email or web address] 10/15/2014, 10:55 AM

## 2014-10-15 NOTE — ED Notes (Signed)
MD at bedside. 

## 2014-10-15 NOTE — Progress Notes (Signed)
CRITICAL VALUE ALERT  Critical value received:  Troponin 0.33  Date of notification:  10/15/2014  Time of notification:  12:41  Critical value read back:Yes.    Nurse who received alert:  Sharyon Medicus RN  MD notified (1st page):  Wyline Copas, MD  Time of first page:  12:42  MD notified (2nd page):  Time of second page:  Responding MD:  Wyline Copas, MD  Time MD responded:  12:46

## 2014-10-15 NOTE — ED Provider Notes (Signed)
CSN: [id number]     Arrival date & time 10/15/14  0744 History   First MD Initiated Contact with Patient 10/15/14 [phone]     Chief Complaint  Patient presents with  . Chest Pain     (Consider location/radiation/quality/duration/timing/severity/associated sxs/prior Treatment) HPI Complains of anterior left anterior chest pain radiating to left scapular area onset 3 AM today coming by nausea and shortness of breath. She treated herself with Zantac and ginger ale without relief she denies nausea presently discomfort is mild, pressure-like in quality. No other associated symptoms. Nothing makes symptoms better or worse Past Medical History  Diagnosis Date  . Benign positional vertigo   . Osteopenia     dexa 08/22/07 AP spine + 1.1, left femur -1.3, right femur -.8; dexa 10/06/09 +1.6, left femur =1.6, right femur -.  . Hypertension   . Hyperlipidemia     <130 ldl pos fm hx, bp  . Obesity   . Asthma     xolair s 8/05 ?11/07; mastered hfa 12/20/08  . Allergic rhinitis   . Ruptured disc, cervical   . Depression    Past Surgical History  Procedure Laterality Date  . Cervical disc surgery  12/01  . Vein ligation and stripping    . Vulva /perineum biopsy  12-30-10    --epidermoid cyst  . Breast surgery  10-26-10    Rt. breast bx--for microcalcifications in rt. retroareolar region--dx was hyalinized fibroadenoma  . Bunionectomy Bilateral    Family History  Problem Relation Age of Onset  . Diabetes Mother     AODM  . Stroke Mother   . Hypertension Mother   . Heart disease Father   . Breast cancer Daughter 36    dec--mets to liver/spine  . Diabetes Sister     AODM  . Breast cancer Sister   . Hypertension Sister    History  Substance Use Topics  . Smoking status: Never Smoker   . Smokeless tobacco: Never Used  . Alcohol Use: No   OB History   Grav Para Term Preterm Abortions TAB SAB Ect Mult Living   2 1 1  1  1   1      Review of Systems  Respiratory: Positive for shortness  of breath.   Cardiovascular: Positive for chest pain.  Gastrointestinal: Positive for nausea.      Allergies  Codeine phosphate; Aspirin; Diphenhydramine hcl; Fish-derived products; and Penicillins  Home Medications   Prior to Admission medications   Medication Sig Start Date End Date Taking? Authorizing Provider  albuterol (PROAIR HFA) 108 (90 BASE) MCG/ACT inhaler 1-2 puffs every 4-6 hours as needed    Historical Provider, MD  amLODipine (NORVASC) 10 MG tablet take 1 tablet by mouth once daily 11/27/13   Tanda Rockers, MD  atorvastatin (LIPITOR) 20 MG tablet take 1/2 tablet by mouth once daily 07/01/14   Tanda Rockers, MD  beclomethasone (QVAR) 80 MCG/ACT inhaler Inhale 2 puffs into the lungs 2 (two) times daily as needed (shortness of breath).    Historical Provider, MD  budesonide-formoterol (SYMBICORT) 160-4.5 MCG/ACT inhaler Inhale 1-2 puffs into the lungs 2 (two) times daily.  12/04/12   Tanda Rockers, MD  Calcium Carbonate-Vit D-Min 1200-1000 MG-UNIT CHEW Chew 1 tablet by mouth daily.     Historical Provider, MD  cetirizine (ZYRTEC) 10 MG tablet Take 10 mg by mouth at bedtime as needed for allergies.     Historical Provider, MD  cloNIDine (CATAPRES) 0.1 MG tablet take 1 tablet  by mouth twice a day 07/02/14   Tanda Rockers, MD  clotrimazole-betamethasone (LOTRISONE) cream Use as directed    Historical Provider, MD  dextromethorphan-guaiFENesin [facility] DM) 30-600 MG per 12 hr tablet Take 1-2 tablets by mouth every 12 (twelve) hours as needed (cough).     Historical Provider, MD  famotidine (PEPCID) 20 MG tablet Take 20 mg by mouth at bedtime.     Historical Provider, MD  meclizine (ANTIVERT) 50 MG tablet 1-2 every 8 hours as needed for dizziness 07/31/13   Tanda Rockers, MD  mometasone (NASONEX) 50 MCG/ACT nasal spray Place 1-2 sprays into the nose 2 (two) times daily as needed.     Historical Provider, MD  montelukast (SINGULAIR) 10 MG tablet Take 10 mg by mouth at bedtime.     Historical Provider, MD  Multiple Vitamin (MULTIVITAMIN) capsule Take 1 capsule by mouth daily.      Historical Provider, MD  nystatin-triamcinolone (MYCOLOG II) cream Apply 1 application topically 2 (two) times daily. 07/05/14   Lyman Speller, MD  omeprazole (PRILOSEC) 20 MG capsule Take 20 mg by mouth daily.     Historical Provider, MD  oxymetazoline (AFRIN) 0.05 % nasal spray Place 2 sprays into both nostrils every 12 (twelve) hours as needed (x 5 days).    Historical Provider, MD  potassium chloride SA (K-DUR,KLOR-CON) 20 MEQ tablet take 1 tablet by mouth once daily 07/15/14   Tanda Rockers, MD  sodium chloride (OCEAN) 0.65 % nasal spray Place 1 spray into the nose every 4 (four) hours as needed for congestion.     Historical Provider, MD  spironolactone-hydrochlorothiazide Edmonia Lynch) 25-25 MG per tablet take 2 tablets by mouth once daily 11/19/13   Tanda Rockers, MD  traMADol (ULTRAM) 50 MG tablet take 1 to 2 tablets by mouth every 6 to 8 hours if needed for pain 09/26/14   Tanda Rockers, MD   BP 127/74  Pulse 67  Temp(Src) 97.5 F (36.4 C) (Oral)  Resp 18  Ht 4\' 11"  (1.499 m)  Wt 131 lb (59.421 kg)  BMI 26.44 kg/m2  SpO2 100%  LMP 12/21/1999 Physical Exam  Nursing note and vitals reviewed. Constitutional: She appears well-developed and well-nourished.  HENT:  Head: Normocephalic and atraumatic.  Eyes: Conjunctivae are normal. Pupils are equal, round, and reactive to light.  Neck: Neck supple. No tracheal deviation present. No thyromegaly present.  Cardiovascular: Normal rate and regular rhythm.   No murmur heard. Pulmonary/Chest: Effort normal and breath sounds normal.  Abdominal: Soft. Bowel sounds are normal. She exhibits no distension. There is no tenderness.  Musculoskeletal: Normal range of motion. She exhibits no edema and no tenderness.  Neurological: She is alert. Coordination normal.  Skin: Skin is warm and dry. No rash noted.  Psychiatric: She has a normal  mood and affect.    ED Course  Procedures (including critical care time) Labs Review Labs Reviewed - No data to display  Imaging Review No results found.   EKG Interpretation   Date/Time:  Tuesday October 15 2014 07:51:30 EDT Ventricular Rate:  68 PR Interval:  119 QRS Duration: 96 QT Interval:  409 QTC Calculation: 435 R Axis:   57 Text Interpretation:  Sinus rhythm Borderline short PR interval  Nonspecific T abnrm, anterolateral leads No old tracing to compare  Confirmed by Rose Hippler  MD, Jermarion Poffenberger [phone]) on 10/15/2014 8:14:15 AM     8:45 AM patient pain-free, asymptomatic after treatment with one sublingual nitroglycerin.  Chest x-ray viewed  by me. Results for orders placed during the hospital encounter of 10/15/14  CBC WITH DIFFERENTIAL      Result Value Ref Range   WBC 7.4  4.0 - 10.5 K/uL   RBC 5.23 (*) 3.87 - 5.11 MIL/uL   Hemoglobin 16.0 (*) 12.0 - 15.0 g/dL   HCT 44.8  36.0 - 46.0 %   MCV 85.7  78.0 - 100.0 fL   MCH 30.6  26.0 - 34.0 pg   MCHC 35.7  30.0 - 36.0 g/dL   RDW 13.5  11.5 - 15.5 %   Platelets 384  150 - 400 K/uL   Neutrophils Relative % 73  43 - 77 %   Neutro Abs 5.4  1.7 - 7.7 K/uL   Lymphocytes Relative 18  12 - 46 %   Lymphs Abs 1.3  0.7 - 4.0 K/uL   Monocytes Relative 7  3 - 12 %   Monocytes Absolute 0.5  0.1 - 1.0 K/uL   Eosinophils Relative 2  0 - 5 %   Eosinophils Absolute 0.1  0.0 - 0.7 K/uL   Basophils Relative 0  0 - 1 %   Basophils Absolute 0.0  0.0 - 0.1 K/uL  I-STAT TROPOININ, ED      Result Value Ref Range   Troponin i, poc 0.01  0.00 - 0.08 ng/mL   Comment 3           I-STAT CHEM 8, ED      Result Value Ref Range   Sodium 137  137 - 147 mEq/L   Potassium 3.9  3.7 - 5.3 mEq/L   Chloride 100  96 - 112 mEq/L   BUN 10  6 - 23 mg/dL   Creatinine, Ser 0.60  0.50 - 1.10 mg/dL   Glucose, Bld 112 (*) 70 - 99 mg/dL   Calcium, Ion 1.13  1.13 - 1.30 mmol/L   TCO2 29  0 - 100 mmol/L   Hemoglobin 17.0 (*) 12.0 - 15.0 g/dL   HCT 50.0  (*) 36.0 - 46.0 %   Dg Chest Port 1 View  10/15/2014   CLINICAL DATA:  One day history of chest pain; history of asthma and hypertension  EXAM: PORTABLE CHEST - 1 VIEW  COMPARISON:  November 19, 2013  FINDINGS: There is a small area of infiltrate in the right base region. Elsewhere lungs are clear. Heart size and pulmonary vascularity are normal. No pneumothorax. No adenopathy. There is postoperative change in the lower cervical spine region.  IMPRESSION: Infiltrate right base.   Electronically Signed   By: Lowella Grip M.D.   On: 10/15/2014 08:52    MDM  Cardiac risk factors hypertension hypercholesterolemia Final diagnoses:  None   strongly doubt pneumonia patient does not have cough clear breath sounds normal respiratory rate normal pulse oximetry. Doubt pulmonary embolism chest x-ray findings are right-sided she complains of left-sided chest pain. Pain relieved with nitroglycerin most likely consistent with angina. Chest x-ray findings most consistent with atelectasis Spoke with Dr.Chiu plan 23 hour observation telemetry,, nitrates, Plavix Diagnosis chest pain    Orlie Dakin, MD 10/15/14 1149

## 2014-10-16 ENCOUNTER — Encounter (HOSPITAL_COMMUNITY)
Admission: EM | Disposition: A | Discharge status: Home / Self Care | Payer: Medicare Other | Source: Home / Self Care | Attending: Internal Medicine

## 2014-10-16 DIAGNOSIS — I10 Essential (primary) hypertension: Secondary | ICD-10-CM | POA: Diagnosis present

## 2014-10-16 DIAGNOSIS — Z6826 Body mass index (BMI) 26.0-26.9, adult: Secondary | ICD-10-CM | POA: Diagnosis not present

## 2014-10-16 DIAGNOSIS — K219 Gastro-esophageal reflux disease without esophagitis: Secondary | ICD-10-CM | POA: Diagnosis present

## 2014-10-16 DIAGNOSIS — Z79899 Other long term (current) drug therapy: Secondary | ICD-10-CM | POA: Diagnosis not present

## 2014-10-16 DIAGNOSIS — E785 Hyperlipidemia, unspecified: Secondary | ICD-10-CM | POA: Diagnosis present

## 2014-10-16 DIAGNOSIS — Z885 Allergy status to narcotic agent status: Secondary | ICD-10-CM | POA: Diagnosis not present

## 2014-10-16 DIAGNOSIS — R079 Chest pain, unspecified: Secondary | ICD-10-CM | POA: Diagnosis present

## 2014-10-16 DIAGNOSIS — I214 Non-ST elevation (NSTEMI) myocardial infarction: Secondary | ICD-10-CM | POA: Diagnosis present

## 2014-10-16 DIAGNOSIS — I2584 Coronary atherosclerosis due to calcified coronary lesion: Secondary | ICD-10-CM | POA: Diagnosis present

## 2014-10-16 DIAGNOSIS — E669 Obesity, unspecified: Secondary | ICD-10-CM | POA: Diagnosis present

## 2014-10-16 DIAGNOSIS — I25110 Atherosclerotic heart disease of native coronary artery with unstable angina pectoris: Secondary | ICD-10-CM | POA: Diagnosis present

## 2014-10-16 DIAGNOSIS — Z886 Allergy status to analgesic agent status: Secondary | ICD-10-CM | POA: Diagnosis not present

## 2014-10-16 DIAGNOSIS — I2510 Atherosclerotic heart disease of native coronary artery without angina pectoris: Secondary | ICD-10-CM

## 2014-10-16 DIAGNOSIS — F329 Major depressive disorder, single episode, unspecified: Secondary | ICD-10-CM | POA: Diagnosis present

## 2014-10-16 DIAGNOSIS — Z888 Allergy status to other drugs, medicaments and biological substances status: Secondary | ICD-10-CM | POA: Diagnosis not present

## 2014-10-16 DIAGNOSIS — E876 Hypokalemia: Secondary | ICD-10-CM | POA: Diagnosis present

## 2014-10-16 DIAGNOSIS — Z880 Allergy status to penicillin: Secondary | ICD-10-CM | POA: Diagnosis not present

## 2014-10-16 DIAGNOSIS — J45909 Unspecified asthma, uncomplicated: Secondary | ICD-10-CM | POA: Diagnosis present

## 2014-10-16 HISTORY — PX: LEFT HEART CATHETERIZATION WITH CORONARY ANGIOGRAM: SHX5451

## 2014-10-16 LAB — COMPREHENSIVE METABOLIC PANEL
ALBUMIN: 3.2 g/dL — AB (ref 3.5–5.2)
ALK PHOS: 59 U/L (ref 39–117)
ALT: 18 U/L (ref 0–35)
ANION GAP: 13 (ref 5–15)
AST: 44 U/L — ABNORMAL HIGH (ref 0–37)
BILIRUBIN TOTAL: 0.5 mg/dL (ref 0.3–1.2)
BUN: 10 mg/dL (ref 6–23)
CO2: 25 mEq/L (ref 19–32)
Calcium: 9 mg/dL (ref 8.4–10.5)
Chloride: 100 mEq/L (ref 96–112)
Creatinine, Ser: 0.81 mg/dL (ref 0.50–1.10)
GFR calc Af Amer: 82 mL/min — ABNORMAL LOW (ref >90)
GFR calc non Af Amer: 71 mL/min — ABNORMAL LOW (ref >90)
GLUCOSE: 96 mg/dL (ref 70–99)
POTASSIUM: 3.4 meq/L — AB (ref 3.7–5.3)
Sodium: 138 mEq/L (ref 137–147)
Total Protein: 6.6 g/dL (ref 6.0–8.3)

## 2014-10-16 LAB — CBC
HEMATOCRIT: 36.8 % (ref 36.0–46.0)
Hemoglobin: 12.8 g/dL (ref 12.0–15.0)
MCH: 30.4 pg (ref 26.0–34.0)
MCHC: 34.8 g/dL (ref 30.0–36.0)
MCV: 87.4 fL (ref 78.0–100.0)
Platelets: 357 10*3/uL (ref 150–400)
RBC: 4.21 MIL/uL (ref 3.87–5.11)
RDW: 13.9 % (ref 11.5–15.5)
WBC: 8 10*3/uL (ref 4.0–10.5)

## 2014-10-16 LAB — POCT ACTIVATED CLOTTING TIME: ACTIVATED CLOTTING TIME: 388 s

## 2014-10-16 LAB — HEPARIN LEVEL (UNFRACTIONATED): Heparin Unfractionated: 0.25 IU/mL — ABNORMAL LOW (ref 0.30–0.70)

## 2014-10-16 LAB — D-DIMER, QUANTITATIVE (NOT AT ARMC): D DIMER QUANT: 0.4 ug{FEU}/mL (ref 0.00–0.48)

## 2014-10-16 LAB — PROTIME-INR
INR: 1.12 (ref 0.00–1.49)
Prothrombin Time: 14.5 seconds (ref 11.6–15.2)

## 2014-10-16 LAB — TROPONIN I: Troponin I: 4.71 ng/mL (ref <0.30)

## 2014-10-16 SURGERY — LEFT HEART CATHETERIZATION WITH CORONARY ANGIOGRAM
Anesthesia: LOCAL

## 2014-10-16 MED ORDER — BUDESONIDE-FORMOTEROL FUMARATE 160-4.5 MCG/ACT IN AERO
2.0000 | INHALATION_SPRAY | Freq: Every day | RESPIRATORY_TRACT | Status: DC
  Administered 2014-10-19: 2 via RESPIRATORY_TRACT
  Filled 2014-10-16 (×2): qty 6

## 2014-10-16 MED ORDER — HEPARIN (PORCINE) IN NACL 100-0.45 UNIT/ML-% IJ SOLN
950.0000 [IU]/h | INTRAMUSCULAR | Status: DC
  Administered 2014-10-16: 850 [IU]/h via INTRAVENOUS
  Administered 2014-10-17: 07:00:00 950 [IU]/h via INTRAVENOUS
  Filled 2014-10-16 (×2): qty 250

## 2014-10-16 MED ORDER — BIVALIRUDIN 250 MG IV SOLR
INTRAVENOUS | Status: AC
  Filled 2014-10-16: qty 250

## 2014-10-16 MED ORDER — HEPARIN (PORCINE) IN NACL 100-0.45 UNIT/ML-% IJ SOLN
850.0000 [IU]/h | INTRAMUSCULAR | Status: DC
  Filled 2014-10-16: qty 250

## 2014-10-16 MED ORDER — LIDOCAINE HCL (PF) 1 % IJ SOLN
INTRAMUSCULAR | Status: AC
  Filled 2014-10-16: qty 30

## 2014-10-16 MED ORDER — FAMOTIDINE 20 MG PO TABS
20.0000 mg | ORAL_TABLET | Freq: Every day | ORAL | Status: DC
  Administered 2014-10-16 – 2014-10-18 (×3): 20 mg via ORAL
  Filled 2014-10-16 (×5): qty 1

## 2014-10-16 MED ORDER — POTASSIUM CHLORIDE CRYS ER 20 MEQ PO TBCR
40.0000 meq | EXTENDED_RELEASE_TABLET | Freq: Once | ORAL | Status: AC
  Administered 2014-10-16: 40 meq via ORAL
  Filled 2014-10-16: qty 2

## 2014-10-16 MED ORDER — ATORVASTATIN CALCIUM 80 MG PO TABS
80.0000 mg | ORAL_TABLET | Freq: Every day | ORAL | Status: DC
  Administered 2014-10-16 – 2014-10-18 (×3): 80 mg via ORAL
  Filled 2014-10-16 (×5): qty 1

## 2014-10-16 MED ORDER — TICAGRELOR 90 MG PO TABS
90.0000 mg | ORAL_TABLET | Freq: Two times a day (BID) | ORAL | Status: DC
  Administered 2014-10-17 – 2014-10-19 (×5): 90 mg via ORAL
  Filled 2014-10-16 (×7): qty 1

## 2014-10-16 MED ORDER — HEPARIN SODIUM (PORCINE) 1000 UNIT/ML IJ SOLN
INTRAMUSCULAR | Status: AC
  Filled 2014-10-16: qty 1

## 2014-10-16 MED ORDER — VERAPAMIL HCL 2.5 MG/ML IV SOLN
INTRAVENOUS | Status: AC
  Filled 2014-10-16: qty 4

## 2014-10-16 MED ORDER — MONTELUKAST SODIUM 10 MG PO TABS
10.0000 mg | ORAL_TABLET | Freq: Every day | ORAL | Status: DC
  Administered 2014-10-16 – 2014-10-18 (×3): 10 mg via ORAL
  Filled 2014-10-16 (×5): qty 1

## 2014-10-16 MED ORDER — FENTANYL CITRATE 0.05 MG/ML IJ SOLN
INTRAMUSCULAR | Status: AC
  Filled 2014-10-16: qty 2

## 2014-10-16 MED ORDER — MIDAZOLAM HCL 2 MG/2ML IJ SOLN
INTRAMUSCULAR | Status: AC
  Filled 2014-10-16: qty 2

## 2014-10-16 MED ORDER — NITROGLYCERIN 1 MG/10 ML FOR IR/CATH LAB
INTRA_ARTERIAL | Status: AC
  Filled 2014-10-16: qty 10

## 2014-10-16 MED ORDER — HEPARIN (PORCINE) IN NACL 2-0.9 UNIT/ML-% IJ SOLN
INTRAMUSCULAR | Status: AC
  Filled 2014-10-16: qty 1000

## 2014-10-16 MED ORDER — TICAGRELOR 90 MG PO TABS
ORAL_TABLET | ORAL | Status: AC
  Filled 2014-10-16: qty 2

## 2014-10-16 NOTE — Progress Notes (Signed)
    Subjective:  Denies CP or dyspnea   Objective:  Filed Vitals:   10/15/14 1329 10/15/14 1444 10/15/14 2153 10/16/14 0436  BP: 112/68 119/67 114/66 96/60  Pulse: 58 60 61 59  Temp:   98.3 F (36.8 C) 98.2 F (36.8 C)  TempSrc:   Oral Oral  Resp:   12 14  Height:      Weight:      SpO2: 97%  98% 96%    Intake/Output from previous day:  Intake/Output Summary (Last 24 hours) at 10/16/14 0701 Last data filed at 10/15/14 1900  Gross per 24 hour  Intake 446.33 ml  Output      0 ml  Net 446.33 ml    Physical Exam: Physical exam: Well-developed well-nourished in no acute distress.  Skin is warm and dry.  HEENT is normal.  Neck is supple.  Chest is clear to auscultation with normal expansion.  Cardiovascular exam is regular rate and rhythm.  Abdominal exam nontender or distended. No masses palpated. Extremities show no edema. neuro grossly intact    Lab Results: Basic Metabolic Panel:  Recent Labs  10/15/14 0845 10/15/14 1145 10/16/14 0130  NA 137  --  138  K 3.9  --  3.4*  CL 100  --  100  CO2  --   --  25  GLUCOSE 112*  --  96  BUN 10  --  10  CREATININE 0.60 0.57 0.81  CALCIUM  --   --  9.0   CBC:  Recent Labs  10/15/14 0833  10/15/14 1145 10/16/14 0130  WBC 7.4  --  7.4 8.0  NEUTROABS 5.4  --   --   --   HGB 16.0*  < > 14.4 12.8  HCT 44.8  < > 40.9 36.8  MCV 85.7  --  85.6 87.4  PLT 384  --  357 357  < > = values in this interval not displayed. Cardiac Enzymes:  Recent Labs  10/15/14 1145 10/15/14 1853 10/16/14 0130  TROPONINI 0.33* 2.39* 4.71*     Assessment/Plan:  1 non-ST elevation myocardial infarction-the patient has ruled in. She is pain-free. Plan cardiac catheterization today. The risks and benefits were discussed and she agrees to proceed. Continue Plavix. She has an aspirin allergy. Continue heparin and nitrates. Increase Lipitor to 80 mg daily. We will hold on beta blocker as she has been bradycardic with heart rate in  the high 40s. 2 hypertension-blood pressure is borderline. Hold diuretics and clonidine for now. Follow blood pressure and adjust regimen as needed. Note if heart rate increases off of clonidine would add beta blocker at that point. 3 hyperlipidemia-increase Lipitor to 80 mg daily.  Kirk Ruths 10/16/2014, 7:01 AM

## 2014-10-16 NOTE — CV Procedure (Signed)
Cardiac Catheterization Procedure Note  Name: Cassie Campbell MRN: [id number] DOB: [birth year]  Procedure: Left Heart Cath, Selective Coronary Angiography, attempted unsuccessful PTCA of the RCA due to inability to dilate the mid lesion (heavy calcifications).   Indication: NSTEMI  Medications:  Sedation:  2 mg IV Versed, 175 ml mcg IV Fentanyl  Contrast:  75 ml Omnipaque  Procedural Details: The right wrist was prepped, draped, and anesthetized with 1% lidocaine. Using the modified Seldinger technique, a 5 French Slender sheath was introduced into the right radial artery. 3 mg of verapamil was administered through the sheath, weight-based unfractionated heparin was administered intravenously. A Jackie catheter was used for selective coronary angiography and to measure LV pressure. Catheter exchanges were performed over an exchange length guidewire. There were no immediate procedural complications.  Procedural Findings:  Hemodynamics: AO:  126/60   mmHg LV:  126/6    mmHg LVEDP: 16  mmHg  Coronary angiography: Coronary dominance: right    Left Main:  mildly calcified with no significant obstructive disease.  Left Anterior Descending (LAD):  Heavily calcified throughout its course. There is diffuse 20% disease proximally and 40-50% disease in the midsegment. There is 70% stenosis in multiple areas distally all the way close to the apex  1st diagonal (D1):  Small in size with minor irregularities.  2nd diagonal (D2):  Small in size with minor irregularities.  3rd diagonal (D3):  Small in size with minor irregularities.  Circumflex (LCx):  Normal in size and nondominant. The vessel is moderately calcified with 20-30% stenosis in the midsegment.  1st obtuse marginal:  Small in size with 95% proximal stenosis.  2nd obtuse marginal:  Large in size with minor irregularities.  3rd obtuse marginal:  Normal in size with minor irregularities.   AV groove continuation segment:  Medium in size with minor irregularities.  Right Coronary Artery: Normal in size and dominant. The vessel is heavily calcified especially in the mid and distal segment. There is 20% proximal stenosis. There is 95% heavily calcified stenosis in the midsegment. There is 99% discrete stenosis in the mid to distal segment with TIMI 1 flow throughout the vessel. The distal vessel into the PDA is underfilling and could not be evaluated although there might be some ostial PDA disease.   Left ventriculography: Was not performed. EF is normal by echo.  PCI Note:  Following the diagnostic procedure, the decision was made to proceed with PCI.  Weight-based bivalirudin was given for anticoagulation. Once a therapeutic ACT was achieved, a 6 [place] JR4 guide catheter was inserted.  A run through coronary guidewire was used to cross the lesion.  I could not cross the mid stenosis with a 2.0 balloon. I was able to cross with a 1.2 balloon and inflated to 10 atm. I could not advance the balloon any further and could not advance any other balloons in spite of using a buddy wire and deep seating the guide. There was actually improved flow just with 1 inflation but still with significant disease. I decided to stop the procedure. The patient was having severe pain in the right arm with a very manipulation of the catheter. Radial angiography performed through the sheath at the end showed no evidence of perforation although there is significant tortuosity. I decided that the best option would be to stage atherectomy of the RCA via the femoral artery approach. The patient tolerated the procedure well. There were no immediate procedural complications. A TR band was used for radial hemostasis. The  patient was transferred to the post catheterization recovery area for further monitoring.  PCI Data: Vessel - RCA/Segment - mid Percent Stenosis (pre)  95% TIMI-flow 1  balloon angioplasty only (not successful) Percent Stenosis (post)  90 TIMI-flow (post) 3   Final Conclusions:  1. Significant 2 vessel coronary artery disease. The distal LAD is diffusely diseased and not suitable for revascularization. The culprit for non-ST elevation myocardial infarction is likely the right coronary artery which is heavily calcified. 2. Normal LV systolic function by echo. Mildly elevated left ventricular end-diastolic pressure. 3. Unsuccessful RCA PCI due to inability to dilate the lesion as the result of heavy calcifications.  Recommendations:  Atherectomy of the right coronary artery via the femoral artery. I will resume heparin in 8 hours. The patient was loaded with Brilinta . She is allergic to aspirin.  Kathlyn Sacramento MD, [facility] 10/16/2014, 2:25 PM

## 2014-10-16 NOTE — Progress Notes (Signed)
ANTICOAGULATION CONSULT NOTE - F/u consult  Pharmacy Consult for Heparin Indication: chest pain/ACS  Allergies  Allergen Reactions  . Codeine Phosphate Nausea Only  . Aspirin Swelling and Rash  . Diphenhydramine Hcl Rash  . Fish-Derived Products Swelling and Rash  . Penicillins Swelling and Rash    Patient Measurements: Height: 4\' 11"  (149.9 cm) Weight: 131 lb (59.421 kg) IBW/kg (Calculated) : 43.2 Heparin Dosing Weight: 55.6 kg  Vital Signs: Temp: 98.3 F (36.8 C) (10/27 2153) Temp Source: Oral (10/27 2153) BP: 114/66 mmHg (10/27 2153) Pulse Rate: 61 (10/27 2153)  Labs:  Recent Labs  10/15/14 0833 10/15/14 0845 10/15/14 1145 10/15/14 1743 10/15/14 1853 10/16/14 0130  HGB 16.0* 17.0* 14.4  --   --  12.8  HCT 44.8 50.0* 40.9  --   --  36.8  PLT 384  --  357  --   --  357  APTT  --   --   --  29  --   --   LABPROT  --   --   --  14.0  --   --   INR  --   --   --  1.06  --   --   HEPARINUNFRC  --   --   --   --   --  0.25*  CREATININE  --  0.60 0.57  --   --  0.81  TROPONINI  --   --  0.33*  --  2.39* 4.71*    Estimated Creatinine Clearance: 49.3 ml/min (by C-G formula based on Cr of 0.81).   Medical History: Past Medical History  Diagnosis Date  . Benign positional vertigo   . Osteopenia     dexa 08/22/07 AP spine + 1.1, left femur -1.3, right femur -.8; dexa 10/06/09 +1.6, left femur =1.6, right femur -.  . Hypertension   . Hyperlipidemia     <130 ldl pos fm hx, bp  . Obesity   . Asthma     xolair s 8/05 ?11/07; mastered hfa 12/20/08  . Allergic rhinitis   . Ruptured disc, cervical   . Depression   . PONV (postoperative nausea and vomiting)      Assessment: 58 yoF with PMH of HTN, HLD, obesity presented with chest pain concerning for unstable angina and borderline elevated troponin.  Cardiology has seen the patient and is recommending IV heparin and cardiac cath.  Starting heparin this evening with plans for cath tomorrow afternoon.  Patient received  Plavix (ASA allergy) and NTG paste today.  Patient received heparin SQ 5000 units at 1533 10/27  CBC WNL. CrCl~59 ml/min 10/28 (Today)  1st HL = 0.25 units/ml  No infusion problems or bleeding per RN  Goal of Therapy:  Heparin level 0.3-0.7 units/ml Monitor platelets by anticoagulation protocol: Yes   Plan:  1. Increase heparin drip to 850 units/hr 2. Recheck HL in 8 hours 3.  Daily HL and CBC while on heparin.  Lawana Pai R 10/16/2014,2:46 AM

## 2014-10-16 NOTE — Progress Notes (Signed)
Received pt from [place] from [facility] for a Cath procedure.  Pt alert and denies any discomfort at this time.

## 2014-10-16 NOTE — Progress Notes (Signed)
UR completed 

## 2014-10-16 NOTE — Progress Notes (Signed)
[facility] PROGRESS NOTE  Cassie Campbell DGL:[id number] DOB: 1941/09/10 DOA: 10/15/2014 PCP: Christinia Gully, MD  Assessment/Plan: 1-N-STEMI;  Patient presents with chest pain, pressure. Troponin elevated at 2.39---4.71. On heparin Gtt.  Cardio consulted, plan for Cath today.  Allergy to aspirin.  Continue with statin.   2-HTN; SBP soft. Hold Norvasc, spironolactone, clonidine.  -resume BP medication when BP increases.   3-Asthma; resume home medications.   4-Hypokalemia; replete with oral.     Code Status: full code.  Family Communication: care discussed with patient.  Disposition Plan: remain inpatient, [facility].    Consultants:  Cardiology  Procedures: ECHO; LVEF 55-60%, mild LVH, indeterminate diastolic function, normal LA size.     Antibiotics:  none  HPI/Subjective: Feeling better, chest pain free.   Objective: Filed Vitals:   10/16/14 0436  BP: 96/60  Pulse: 59  Temp: 98.2 F (36.8 C)  Resp: 14    Intake/Output Summary (Last 24 hours) at 10/16/14 0911 Last data filed at 10/15/14 1900  Gross per 24 hour  Intake 446.33 ml  Output      0 ml  Net 446.33 ml   Filed Weights   10/15/14 0756  Weight: 59.421 kg (131 lb)    Exam:   General:  Alert in no distress.   Cardiovascular: S 1, S 2 RRR  Respiratory: CTA  Abdomen: Bs present, soft, NT  Musculoskeletal: no edema.    Data Reviewed: Basic Metabolic Panel:  Recent Labs Lab 10/15/14 0845 10/15/14 1145 10/16/14 0130  NA 137  --  138  K 3.9  --  3.4*  CL 100  --  100  CO2  --   --  25  GLUCOSE 112*  --  96  BUN 10  --  10  CREATININE 0.60 0.57 0.81  CALCIUM  --   --  9.0   Liver Function Tests:  Recent Labs Lab 10/16/14 0130  AST 44*  ALT 18  ALKPHOS 59  BILITOT 0.5  PROT 6.6  ALBUMIN 3.2*   No results found for this basename: LIPASE, AMYLASE,  in the last 168 hours No results found for this basename: AMMONIA,  in the last 168  hours CBC:  Recent Labs Lab 10/15/14 0833 10/15/14 0845 10/15/14 1145 10/16/14 0130  WBC 7.4  --  7.4 8.0  NEUTROABS 5.4  --   --   --   HGB 16.0* 17.0* 14.4 12.8  HCT 44.8 50.0* 40.9 36.8  MCV 85.7  --  85.6 87.4  PLT 384  --  357 357   Cardiac Enzymes:  Recent Labs Lab 10/15/14 1145 10/15/14 1853 10/16/14 0130  TROPONINI 0.33* 2.39* 4.71*   BNP (last 3 results) No results found for this basename: PROBNP,  in the last 8760 hours CBG: No results found for this basename: GLUCAP,  in the last 168 hours  No results found for this or any previous visit (from the past 240 hour(s)).   Studies: Dg Chest Port 1 View  10/15/2014   CLINICAL DATA:  One day history of chest pain; history of asthma and hypertension  EXAM: PORTABLE CHEST - 1 VIEW  COMPARISON:  November 19, 2013  FINDINGS: There is a small area of infiltrate in the right base region. Elsewhere lungs are clear. Heart size and pulmonary vascularity are normal. No pneumothorax. No adenopathy. There is postoperative change in the lower cervical spine region.  IMPRESSION: Infiltrate right base.   Electronically Signed   By: Gwyndolyn Saxon  Jasmine December M.D.   On: 10/15/2014 08:52    Scheduled Meds: . atorvastatin  80 mg Oral q1800  . pantoprazole  40 mg Oral Daily  . sodium chloride  3 mL Intravenous Q12H  . sodium chloride  3 mL Intravenous Q12H   Continuous Infusions: . sodium chloride 75 mL/hr at 10/15/14 1309  . sodium chloride 100 mL/hr at 10/16/14 0116  . heparin 850 Units/hr (10/16/14 0257)    Principal Problem:   NSTEMI (non-ST elevated myocardial infarction) Active Problems:   Essential hypertension   GERD (gastroesophageal reflux disease)   Chest pain    Time spent: 35 minutes.     Niel Hummer A  [facility] Pager [phone]. If 7PM-7AM, please contact night-coverage at [email or web address], password [facility] 10/16/2014, 9:11 AM  LOS: 1 day

## 2014-10-16 NOTE — Progress Notes (Signed)
ANTICOAGULATION CONSULT NOTE - F/u consult  Pharmacy Consult for Heparin Indication: chest pain/ACS  Allergies  Allergen Reactions  . Codeine Phosphate Nausea Only  . Aspirin Swelling and Rash  . Diphenhydramine Hcl Rash  . Fish-Derived Products Swelling and Rash  . Penicillins Swelling and Rash    Patient Measurements: Height: 4\' 11"  (149.9 cm) Weight: 131 lb (59.421 kg) IBW/kg (Calculated) : 43.2 Heparin Dosing Weight: 55.6 kg  Vital Signs: Temp: 98.2 F (36.8 C) (10/28 0436) Temp Source: Oral (10/28 0436) BP: 124/61 mmHg (10/28 1235) Pulse Rate: 67 (10/28 1252)  Labs:  Recent Labs  10/15/14 0833 10/15/14 0845 10/15/14 1145 10/15/14 1743 10/15/14 1853 10/16/14 0130  HGB 16.0* 17.0* 14.4  --   --  12.8  HCT 44.8 50.0* 40.9  --   --  36.8  PLT 384  --  357  --   --  357  APTT  --   --   --  29  --   --   LABPROT  --   --   --  14.0  --  14.5  INR  --   --   --  1.06  --  1.12  HEPARINUNFRC  --   --   --   --   --  0.25*  CREATININE  --  0.60 0.57  --   --  0.81  TROPONINI  --   --  0.33*  --  2.39* 4.71*    Estimated Creatinine Clearance: 49.3 ml/min (by C-G formula based on Cr of 0.81).   Medical History: Past Medical History  Diagnosis Date  . Benign positional vertigo   . Osteopenia     dexa 08/22/07 AP spine + 1.1, left femur -1.3, right femur -.8; dexa 10/06/09 +1.6, left femur =1.6, right femur -.  . Hypertension   . Hyperlipidemia     <130 ldl pos fm hx, bp  . Obesity   . Asthma     xolair s 8/05 ?11/07; mastered hfa 12/20/08  . Allergic rhinitis   . Ruptured disc, cervical   . Depression   . PONV (postoperative nausea and vomiting)    Assessment: 89 yoF with PMH of HTN, HLD, obesity presented with chest pain concerning for unstable angina and borderline elevated troponin.  Cardiology has seen the patient and is recommending IV heparin and cardiac cath.  Starting heparin this evening with plans for cath tomorrow afternoon.  Patient received  Plavix (ASA allergy) and NTG paste today.  Patient received heparin SQ 5000 units at 1533 10/27. Pt was then transferred from [facility] to [facility] for cardiac cath.  Pharmacy was consulted to resume heparin following cath 8 hours post-sheath removal. Sheath removed at 14:14. Prior to cath, patient's heparin level was increased to 850 units/hr from 700 units/hr with a subtherapeutic heparin level of 0.25.  Goal of Therapy:  Heparin level 0.3-0.7 units/ml Monitor platelets by anticoagulation protocol: Yes   Plan:  -Start heparin infusion 850 units/hr at 2215 -Recheck HL in 8 hours -Daily HL and CBC while on heparin.  Sharilyn Sites M 10/16/2014,3:06 PM

## 2014-10-16 NOTE — Progress Notes (Signed)
Pt voided and transported to cath lab for procedure.

## 2014-10-17 ENCOUNTER — Encounter (HOSPITAL_COMMUNITY)
Admission: EM | Disposition: A | Discharge status: Home / Self Care | Payer: Medicare Other | Source: Home / Self Care | Attending: Internal Medicine

## 2014-10-17 DIAGNOSIS — I25111 Atherosclerotic heart disease of native coronary artery with angina pectoris with documented spasm: Secondary | ICD-10-CM

## 2014-10-17 HISTORY — PX: PERCUTANEOUS CORONARY ROTOBLATOR INTERVENTION (PCI-R): SHX5484

## 2014-10-17 LAB — BASIC METABOLIC PANEL
ANION GAP: 12 (ref 5–15)
BUN: 7 mg/dL (ref 6–23)
CALCIUM: 9.3 mg/dL (ref 8.4–10.5)
CO2: 26 meq/L (ref 19–32)
Chloride: 104 mEq/L (ref 96–112)
Creatinine, Ser: 0.63 mg/dL (ref 0.50–1.10)
GFR calc Af Amer: >90 mL/min (ref >90)
GFR, EST NON AFRICAN AMERICAN: 87 mL/min — AB (ref >90)
Glucose, Bld: 93 mg/dL (ref 70–99)
Potassium: 3.6 mEq/L — ABNORMAL LOW (ref 3.7–5.3)
SODIUM: 142 meq/L (ref 137–147)

## 2014-10-17 LAB — HEPARIN LEVEL (UNFRACTIONATED)
HEPARIN UNFRACTIONATED: 0.21 [IU]/mL — AB (ref 0.30–0.70)
HEPARIN UNFRACTIONATED: 0.41 [IU]/mL (ref 0.30–0.70)

## 2014-10-17 LAB — CBC
HCT: 38.2 % (ref 36.0–46.0)
HEMOGLOBIN: 13.2 g/dL (ref 12.0–15.0)
MCH: 29.9 pg (ref 26.0–34.0)
MCHC: 34.6 g/dL (ref 30.0–36.0)
MCV: 86.6 fL (ref 78.0–100.0)
Platelets: 369 10*3/uL (ref 150–400)
RBC: 4.41 MIL/uL (ref 3.87–5.11)
RDW: 14 % (ref 11.5–15.5)
WBC: 7.9 10*3/uL (ref 4.0–10.5)

## 2014-10-17 LAB — POCT ACTIVATED CLOTTING TIME
ACTIVATED CLOTTING TIME: 214 s
ACTIVATED CLOTTING TIME: 422 s
Activated Clotting Time: 129 seconds
Activated Clotting Time: 225 seconds
Activated Clotting Time: 298 seconds

## 2014-10-17 LAB — TROPONIN I: Troponin I: 1.46 ng/mL (ref <0.30)

## 2014-10-17 SURGERY — PERCUTANEOUS CORONARY ROTOBLATOR INTERVENTION (PCI-R)
Anesthesia: LOCAL

## 2014-10-17 MED ORDER — SODIUM CHLORIDE 0.9 % IJ SOLN: 3.0000 mL | Freq: Two times a day (BID) | INTRAMUSCULAR | Status: DC

## 2014-10-17 MED ORDER — TICAGRELOR 90 MG PO TABS: 90.0000 mg | ORAL_TABLET | Freq: Two times a day (BID) | ORAL | Status: DC

## 2014-10-17 MED ORDER — ACETAMINOPHEN 325 MG PO TABS: 650.0000 mg | ORAL_TABLET | ORAL | Status: DC | PRN

## 2014-10-17 MED ORDER — MIDAZOLAM HCL 2 MG/2ML IJ SOLN
INTRAMUSCULAR | Status: AC
  Filled 2014-10-17: qty 2

## 2014-10-17 MED ORDER — TIROFIBAN HCL IV 12.5 MG/250 ML
INTRAVENOUS | Status: AC
  Filled 2014-10-17: qty 250

## 2014-10-17 MED ORDER — SODIUM CHLORIDE 0.9 % IV SOLN
INTRAVENOUS | Status: DC
  Administered 2014-10-17: 07:00:00 via INTRAVENOUS

## 2014-10-17 MED ORDER — FENTANYL CITRATE 0.05 MG/ML IJ SOLN
INTRAMUSCULAR | Status: AC
  Filled 2014-10-17: qty 2

## 2014-10-17 MED ORDER — TIROFIBAN HCL IV 5 MG/100ML
0.1500 ug/kg/min | INTRAVENOUS | Status: AC
  Administered 2014-10-17 – 2014-10-18 (×2): 0.15 ug/kg/min via INTRAVENOUS
  Filled 2014-10-17 (×4): qty 100

## 2014-10-17 MED ORDER — SODIUM CHLORIDE 0.9 % IV SOLN
0.1000 mg/kg/h | Freq: Once | INTRAVENOUS | Status: DC
  Filled 2014-10-17 (×2): qty 250

## 2014-10-17 MED ORDER — ISOSORBIDE MONONITRATE ER 60 MG PO TB24
60.0000 mg | ORAL_TABLET | Freq: Every day | ORAL | Status: DC
  Administered 2014-10-18 – 2014-10-19 (×3): 60 mg via ORAL
  Filled 2014-10-17 (×3): qty 1

## 2014-10-17 MED ORDER — NITROGLYCERIN 1 MG/10 ML FOR IR/CATH LAB
INTRA_ARTERIAL | Status: AC
  Filled 2014-10-17: qty 10

## 2014-10-17 MED ORDER — AMLODIPINE BESYLATE 5 MG PO TABS
5.0000 mg | ORAL_TABLET | Freq: Every day | ORAL | Status: DC
  Administered 2014-10-17 – 2014-10-19 (×3): 5 mg via ORAL
  Filled 2014-10-17 (×3): qty 1

## 2014-10-17 MED ORDER — MORPHINE SULFATE 4 MG/ML IJ SOLN
4.0000 mg | Freq: Once | INTRAMUSCULAR | Status: AC
Start: 2014-10-17 — End: 2014-10-17
  Administered 2014-10-17: 4 mg via INTRAVENOUS
  Filled 2014-10-17: qty 1

## 2014-10-17 MED ORDER — HEPARIN (PORCINE) IN NACL 2-0.9 UNIT/ML-% IJ SOLN
INTRAMUSCULAR | Status: AC
  Filled 2014-10-17: qty 500

## 2014-10-17 MED ORDER — SODIUM CHLORIDE 0.9 % IV SOLN
INTRAVENOUS | Status: DC
  Administered 2014-10-17: 21:00:00 via INTRAVENOUS

## 2014-10-17 MED ORDER — HEPARIN (PORCINE) IN NACL 2-0.9 UNIT/ML-% IJ SOLN
INTRAMUSCULAR | Status: AC
  Filled 2014-10-17: qty 1000

## 2014-10-17 MED ORDER — LIDOCAINE HCL (PF) 1 % IJ SOLN
INTRAMUSCULAR | Status: AC
  Filled 2014-10-17: qty 30

## 2014-10-17 MED ORDER — SODIUM CHLORIDE 0.9 % IJ SOLN: 3.0000 mL | INTRAMUSCULAR | Status: DC | PRN

## 2014-10-17 MED ORDER — TICAGRELOR 90 MG PO TABS
ORAL_TABLET | ORAL | Status: AC
  Filled 2014-10-17: qty 1

## 2014-10-17 MED ORDER — METOPROLOL TARTRATE 25 MG PO TABS
25.0000 mg | ORAL_TABLET | Freq: Two times a day (BID) | ORAL | Status: DC
  Administered 2014-10-18 – 2014-10-19 (×4): 25 mg via ORAL
  Filled 2014-10-17 (×5): qty 1

## 2014-10-17 MED ORDER — BIVALIRUDIN 250 MG IV SOLR
INTRAVENOUS | Status: AC
  Filled 2014-10-17: qty 250

## 2014-10-17 MED ORDER — ONDANSETRON HCL 4 MG/2ML IJ SOLN: 4.0000 mg | Freq: Four times a day (QID) | INTRAMUSCULAR | Status: DC | PRN

## 2014-10-17 MED ORDER — SODIUM CHLORIDE 0.9 % IV SOLN: 250.0000 mL | INTRAVENOUS | Status: DC | PRN

## 2014-10-17 MED ORDER — NITROGLYCERIN IN D5W 200-5 MCG/ML-% IV SOLN
INTRAVENOUS | Status: AC
  Filled 2014-10-17: qty 250

## 2014-10-17 NOTE — Interval H&P Note (Signed)
Cath Lab Visit (complete for each Cath Lab visit)  Clinical Evaluation Leading to the Procedure:   ACS: Yes.    Non-ACS:    Anginal Classification: CCS IV  Anti-ischemic medical therapy: Maximal Therapy (2 or more classes of medications)  Non-Invasive Test Results: No non-invasive testing performed  Prior CABG: No previous CABG      History and Physical Interval Note:  10/17/2014 5:03 PM  Cassie Campbell  has presented today for surgery, with the diagnosis of cad  The various methods of treatment have been discussed with the patient and family. After consideration of risks, benefits and other options for treatment, the patient has consented to  Procedure(s): PERCUTANEOUS CORONARY ROTOBLATOR INTERVENTION (PCI-R) (N/A) as a surgical intervention .  The patient's history has been reviewed, patient examined, no change in status, stable for surgery.  I have reviewed the patient's chart and labs.  Questions were answered to the patient's satisfaction.     KELLY,THOMAS A

## 2014-10-17 NOTE — Progress Notes (Signed)
Patient Name: Cassie Campbell Date of Encounter: 10/17/2014  Principal Problem:   NSTEMI (non-ST elevated myocardial infarction) Active Problems:   Essential hypertension   GERD (gastroesophageal reflux disease)   Chest pain    Patient Profile: 73 year old female with no previous CAD history was admitted 10/27 with chest pain,   SUBJECTIVE: No chest pain, no shortness of breath.  OBJECTIVE Filed Vitals:   10/16/14 2013 10/16/14 2324 10/17/14 0005 10/17/14 0423  BP: 137/71 151/81  163/77  Pulse: 70 70  65  Temp: 98.1 F (36.7 C) 98.6 F (37 C)  98.2 F (36.8 C)  TempSrc: Oral Oral  Oral  Resp: 16 18  20   Height:      Weight:   132 lb 0.9 oz (59.9 kg)   SpO2: 94% 94%  94%    Intake/Output Summary (Last 24 hours) at 10/17/14 0815 Last data filed at 10/17/14 0700  Gross per 24 hour  Intake   1856 ml  Output   2200 ml  Net   -344 ml   Filed Weights   10/15/14 0756 10/17/14 0005  Weight: 131 lb (59.421 kg) 132 lb 0.9 oz (59.9 kg)    PHYSICAL EXAM General: Well developed, well nourished, female in no acute distress. Head: Normocephalic, atraumatic.  Neck: Supple without bruits, JVD not elevated Lungs:  Resp regular and unlabored, slight expiratory wheeze Heart: RRR, S1, S2, no S3, S4, or murmur; no rub. Abdomen: Soft, non-tender, non-distended, BS + x 4.  Extremities: No clubbing, cyanosis, no edema. Right radial Site without ecchymosis or hematoma Neuro: Alert and oriented X 3. Moves all extremities spontaneously. Psych: Normal affect.  LABS: CBC: Recent Labs  10/15/14 0833  10/16/14 0130 10/17/14 0335  WBC 7.4  < > 8.0 7.9  NEUTROABS 5.4  --   --   --   HGB 16.0*  < > 12.8 13.2  HCT 44.8  < > 36.8 38.2  MCV 85.7  < > 87.4 86.6  PLT 384  < > 357 369  < > = values in this interval not displayed. INR: Recent Labs  10/16/14 0130  INR 3.50   Basic Metabolic Panel: Recent Labs  10/16/14 0130 10/17/14 0335  NA 138 142  K 3.4* 3.6*  CL  100 104  CO2 25 26  GLUCOSE 96 93  BUN 10 7  CREATININE 0.81 0.63  CALCIUM 9.0 9.3   Liver Function Tests: Recent Labs  10/16/14 0130  AST 44*  ALT 18  ALKPHOS 59  BILITOT 0.5  PROT 6.6  ALBUMIN 3.2*   Cardiac Enzymes: Recent Labs  10/15/14 1145 10/15/14 1853 10/16/14 0130  TROPONINI 0.33* 2.39* 4.71*    Recent Labs  10/15/14 0842  TROPIPOC 0.01   BNP: Pro B Natriuretic peptide (BNP)  Date/Time Value Ref Range Status  10/05/2006 10:22 AM 8.0  0.0-100.0 pg/mL Final   D-dimer: Recent Labs  10/16/14 0130  DDIMER 0.40   TELE:  Sinus rhythm, no significant ectopy     Radiology/Studies: Dg Chest Port 1 View 10/15/2014   CLINICAL DATA:  One day history of chest pain; history of asthma and hypertension  EXAM: PORTABLE CHEST - 1 VIEW  COMPARISON:  November 19, 2013  FINDINGS: There is a small area of infiltrate in the right base region. Elsewhere lungs are clear. Heart size and pulmonary vascularity are normal. No pneumothorax. No adenopathy. There is postoperative change in the lower cervical spine region.  IMPRESSION: Infiltrate right base.  Electronically Signed   By: Lowella Grip M.D.   On: 10/15/2014 08:52     Current Medications:  . amLODipine  5 mg Oral Daily  . atorvastatin  80 mg Oral q1800  . budesonide-formoterol  2 puff Inhalation Daily  . famotidine  20 mg Oral QHS  . montelukast  10 mg Oral QHS  . pantoprazole  40 mg Oral Daily  . sodium chloride  3 mL Intravenous Q12H  . sodium chloride  3 mL Intravenous Q12H  . ticagrelor  90 mg Oral BID   . sodium chloride 75 mL/hr at 10/15/14 1309  . sodium chloride    . heparin 950 Units/hr (10/17/14 0700)    ASSESSMENT AND PLAN: Principal Problem:   NSTEMI (non-ST elevated myocardial infarction) - s/p cath with RCA heavily calcified, for Rotablator today. Continue current therapy, otherwise stable. Blood pressure medications were discontinued because of hypotension yesterday, SBP greater than 160  today so will restart Norvasc at a lower dose  Otherwise, per IM Active Problems:   Essential hypertension   GERD (gastroesophageal reflux disease)   Chest pain   Signed, Rosaria Ferries , PA-C 8:15 AM 10/17/2014  Patient seen and examined. Agree with assessment and plan. No recurrent chest pain. R Radial site stable. Discussed planned HSRA due to significant RCA calcification with high grade stenosis and need for temporary during procedure today. Pt understands risks/benfits.   Troy Sine, MD, [facility] 10/17/2014 8:54 AM

## 2014-10-17 NOTE — Progress Notes (Signed)
UR completed Philomene Haff K. Gaye Scorza, RN, BSN, [place], CCM  10/17/2014 12:24 PM

## 2014-10-17 NOTE — H&P (View-Only) (Signed)
Patient Name: Cassie Campbell Date of Encounter: 10/17/2014  Principal Problem:   NSTEMI (non-ST elevated myocardial infarction) Active Problems:   Essential hypertension   GERD (gastroesophageal reflux disease)   Chest pain    Patient Profile: 73 year old female with no previous CAD history was admitted 10/27 with chest pain,   SUBJECTIVE: No chest pain, no shortness of breath.  OBJECTIVE Filed Vitals:   10/16/14 2013 10/16/14 2324 10/17/14 0005 10/17/14 0423  BP: 137/71 151/81  163/77  Pulse: 70 70  65  Temp: 98.1 F (36.7 C) 98.6 F (37 C)  98.2 F (36.8 C)  TempSrc: Oral Oral  Oral  Resp: 16 18  20   Height:      Weight:   132 lb 0.9 oz (59.9 kg)   SpO2: 94% 94%  94%    Intake/Output Summary (Last 24 hours) at 10/17/14 0815 Last data filed at 10/17/14 0700  Gross per 24 hour  Intake   1856 ml  Output   2200 ml  Net   -344 ml   Filed Weights   10/15/14 0756 10/17/14 0005  Weight: 131 lb (59.421 kg) 132 lb 0.9 oz (59.9 kg)    PHYSICAL EXAM General: Well developed, well nourished, female in no acute distress. Head: Normocephalic, atraumatic.  Neck: Supple without bruits, JVD not elevated Lungs:  Resp regular and unlabored, slight expiratory wheeze Heart: RRR, S1, S2, no S3, S4, or murmur; no rub. Abdomen: Soft, non-tender, non-distended, BS + x 4.  Extremities: No clubbing, cyanosis, no edema. Right radial Site without ecchymosis or hematoma Neuro: Alert and oriented X 3. Moves all extremities spontaneously. Psych: Normal affect.  LABS: CBC: Recent Labs  10/15/14 0833  10/16/14 0130 10/17/14 0335  WBC 7.4  < > 8.0 7.9  NEUTROABS 5.4  --   --   --   HGB 16.0*  < > 12.8 13.2  HCT 44.8  < > 36.8 38.2  MCV 85.7  < > 87.4 86.6  PLT 384  < > 357 369  < > = values in this interval not displayed. INR: Recent Labs  10/16/14 0130  INR 6.54   Basic Metabolic Panel: Recent Labs  10/16/14 0130 10/17/14 0335  NA 138 142  K 3.4* 3.6*  CL  100 104  CO2 25 26  GLUCOSE 96 93  BUN 10 7  CREATININE 0.81 0.63  CALCIUM 9.0 9.3   Liver Function Tests: Recent Labs  10/16/14 0130  AST 44*  ALT 18  ALKPHOS 59  BILITOT 0.5  PROT 6.6  ALBUMIN 3.2*   Cardiac Enzymes: Recent Labs  10/15/14 1145 10/15/14 1853 10/16/14 0130  TROPONINI 0.33* 2.39* 4.71*    Recent Labs  10/15/14 0842  TROPIPOC 0.01   BNP: Pro B Natriuretic peptide (BNP)  Date/Time Value Ref Range Status  10/05/2006 10:22 AM 8.0  0.0-100.0 pg/mL Final   D-dimer: Recent Labs  10/16/14 0130  DDIMER 0.40   TELE:  Sinus rhythm, no significant ectopy     Radiology/Studies: Dg Chest Port 1 View 10/15/2014   CLINICAL DATA:  One day history of chest pain; history of asthma and hypertension  EXAM: PORTABLE CHEST - 1 VIEW  COMPARISON:  November 19, 2013  FINDINGS: There is a small area of infiltrate in the right base region. Elsewhere lungs are clear. Heart size and pulmonary vascularity are normal. No pneumothorax. No adenopathy. There is postoperative change in the lower cervical spine region.  IMPRESSION: Infiltrate right base.  Electronically Signed   By: Lowella Grip M.D.   On: 10/15/2014 08:52     Current Medications:  . amLODipine  5 mg Oral Daily  . atorvastatin  80 mg Oral q1800  . budesonide-formoterol  2 puff Inhalation Daily  . famotidine  20 mg Oral QHS  . montelukast  10 mg Oral QHS  . pantoprazole  40 mg Oral Daily  . sodium chloride  3 mL Intravenous Q12H  . sodium chloride  3 mL Intravenous Q12H  . ticagrelor  90 mg Oral BID   . sodium chloride 75 mL/hr at 10/15/14 1309  . sodium chloride    . heparin 950 Units/hr (10/17/14 0700)    ASSESSMENT AND PLAN: Principal Problem:   NSTEMI (non-ST elevated myocardial infarction) - s/p cath with RCA heavily calcified, for Rotablator today. Continue current therapy, otherwise stable. Blood pressure medications were discontinued because of hypotension yesterday, SBP greater than 160  today so will restart Norvasc at a lower dose  Otherwise, per IM Active Problems:   Essential hypertension   GERD (gastroesophageal reflux disease)   Chest pain   Signed, Rosaria Ferries , PA-C 8:15 AM 10/17/2014  Patient seen and examined. Agree with assessment and plan. No recurrent chest pain. R Radial site stable. Discussed planned HSRA due to significant RCA calcification with high grade stenosis and need for temporary during procedure today. Pt understands risks/benfits.   Troy Sine, MD, [facility] 10/17/2014 8:54 AM

## 2014-10-17 NOTE — Progress Notes (Signed)
ANTICOAGULATION CONSULT NOTE - Follow Up Consult  Pharmacy Consult for heparin Indication: chest pain/ACS, 2-vessel disease, s/p cath  Allergies  Allergen Reactions  . Codeine Phosphate Nausea Only  . Aspirin Swelling and Rash  . Diphenhydramine Hcl Rash  . Fish-Derived Products Swelling and Rash  . Penicillins Swelling and Rash    Patient Measurements: Height: 4\' 11"  (149.9 cm) Weight: 132 lb 0.9 oz (59.9 kg) IBW/kg (Calculated) : 43.2 Heparin Dosing Weight: 59.9 kg  Vital Signs: Temp: 98 F (36.7 C) (10/29 0833) Temp Source: Oral (10/29 0833) BP: 162/79 mmHg (10/29 0833) Pulse Rate: 74 (10/29 0833)  Labs:  Recent Labs  10/15/14 1145 10/15/14 1743 10/15/14 1853 10/16/14 0130 10/17/14 0335  HGB 14.4  --   --  12.8 13.2  HCT 40.9  --   --  36.8 38.2  PLT 357  --   --  357 369  APTT  --  29  --   --   --   LABPROT  --  14.0  --  14.5  --   INR  --  1.06  --  1.12  --   HEPARINUNFRC  --   --   --  0.25* 0.21*  CREATININE 0.57  --   --  0.81 0.63  TROPONINI 0.33*  --  2.39* 4.71*  --     Estimated Creatinine Clearance: 50.1 ml/min (by C-G formula based on Cr of 0.63).   Medications:  Scheduled:  . amLODipine  5 mg Oral Daily  . atorvastatin  80 mg Oral q1800  . budesonide-formoterol  2 puff Inhalation Daily  . famotidine  20 mg Oral QHS  . montelukast  10 mg Oral QHS  . pantoprazole  40 mg Oral Daily  . sodium chloride  3 mL Intravenous Q12H  . sodium chloride  3 mL Intravenous Q12H  . ticagrelor  90 mg Oral BID   Infusions:  . sodium chloride 75 mL/hr at 10/15/14 1309  . sodium chloride 75 mL/hr at 10/17/14 0700  . heparin 950 Units/hr (10/17/14 0701)    Assessment: 72 yo female with chest pain/ACS, 2-vessel disease, s/p cath is currently on therapeutic heparin.  Heparin level is 0.41.  Goal of Therapy:  Heparin level 0.3-0.7 units/ml Monitor platelets by anticoagulation protocol: Yes   Plan:  - Cont heparin at 950 units/hr -Daily HL and CBC  while on heparin. -f/u after HSRA today   Johnathan Heskett, Tsz-Yin 10/17/2014,9:45 AM

## 2014-10-17 NOTE — Progress Notes (Signed)
Chart reviewed.  [facility] PROGRESS NOTE  Cassie Campbell QAS:[id number] DOB: 05-Apr-1941 DOA: 10/15/2014 PCP: Christinia Gully, MD  Assessment/Plan: 1-N-STEMI;  Cath showing RCA ds. For rotoblade today. No further CO  2-HTN;  3-Asthma; resume home medications.    Consultants:  Cardiology  HPI/Subjective: No complaints  Objective: Filed Vitals:   10/17/14 1612  BP: 177/91  Pulse: 121  Temp: 98 F (36.7 C)  Resp: 18    Intake/Output Summary (Last 24 hours) at 10/17/14 1804 Last data filed at 10/17/14 1500  Gross per 24 hour  Intake   1616 ml  Output   4200 ml  Net  -2584 ml   Filed Weights   10/15/14 0756 10/17/14 0005 10/17/14 0647  Weight: 59.421 kg (131 lb) 59.9 kg (132 lb 0.9 oz) 59.9 kg (132 lb 0.9 oz)    Exam:   General:  Alert in no distress.   Cardiovascular: S 1, S 2 RRR  Respiratory: CTA  Abdomen: Bs present, soft, NT  Musculoskeletal: no edema.    Data Reviewed: Basic Metabolic Panel:  Recent Labs Lab 10/15/14 0845 10/15/14 1145 10/16/14 0130 10/17/14 0335  NA 137  --  138 142  K 3.9  --  3.4* 3.6*  CL 100  --  100 104  CO2  --   --  25 26  GLUCOSE 112*  --  96 93  BUN 10  --  10 7  CREATININE 0.60 0.57 0.81 0.63  CALCIUM  --   --  9.0 9.3   Liver Function Tests:  Recent Labs Lab 10/16/14 0130  AST 44*  ALT 18  ALKPHOS 59  BILITOT 0.5  PROT 6.6  ALBUMIN 3.2*   No results found for this basename: LIPASE, AMYLASE,  in the last 168 hours No results found for this basename: AMMONIA,  in the last 168 hours CBC:  Recent Labs Lab 10/15/14 0833 10/15/14 0845 10/15/14 1145 10/16/14 0130 10/17/14 0335  WBC 7.4  --  7.4 8.0 7.9  NEUTROABS 5.4  --   --   --   --   HGB 16.0* 17.0* 14.4 12.8 13.2  HCT 44.8 50.0* 40.9 36.8 38.2  MCV 85.7  --  85.6 87.4 86.6  PLT 384  --  357 357 369   Cardiac Enzymes:  Recent Labs Lab 10/15/14 1145 10/15/14 1853 10/16/14 0130  TROPONINI 0.33* 2.39* 4.71*   BNP (last 3  results) No results found for this basename: PROBNP,  in the last 8760 hours CBG: No results found for this basename: GLUCAP,  in the last 168 hours  No results found for this or any previous visit (from the past 240 hour(s)).   Studies: No results found.  Scheduled Meds: . amLODipine  5 mg Oral Daily  . atorvastatin  80 mg Oral q1800  . budesonide-formoterol  2 puff Inhalation Daily  . famotidine  20 mg Oral QHS  . montelukast  10 mg Oral QHS  . pantoprazole  40 mg Oral Daily  . sodium chloride  3 mL Intravenous Q12H  . sodium chloride  3 mL Intravenous Q12H  . ticagrelor  90 mg Oral BID   Continuous Infusions: . sodium chloride 75 mL/hr at 10/15/14 1309  . sodium chloride 75 mL/hr at 10/17/14 0700  . heparin 950 Units/hr (10/17/14 0701)    Principal Problem:   NSTEMI (non-ST elevated myocardial infarction) Active Problems:   Essential hypertension   GERD (gastroesophageal reflux disease)   Chest pain  Time spent: 35 minutes.     Delfina Redwood  [facility] Pager [phone]. If 7PM-7AM, please contact night-coverage at [email or web address], password [facility] 10/17/2014, 6:04 PM  LOS: 2 days

## 2014-10-17 NOTE — Care Management Note (Addendum)
  Page 1 of 1   10/17/2014     12:16:11 PM CARE MANAGEMENT NOTE 10/17/2014  Patient:  Cassie Campbell, Cassie Campbell   Account Number:  [id number]  Date Initiated:  10/17/2014  Documentation initiated by:  Lachanda Buczek  Subjective/Objective Assessment:   CP, CAD     Action/Plan:   CM to follow for disposition needs   Anticipated DC Date:  10/17/2014   Anticipated DC Plan:  HOME/SELF CARE  In-house referral  NA      DC Planning Services  CM consult  Medication Assistance      [facility] Choice  NA   Choice offered to / List presented to:  NA           Status of service:  Completed, signed off Medicare Important Message given?  NA - LOS <3 / Initial given by admissions (If response is "NO", the following Medicare IM given date fields will be blank) Date Medicare IM given:   Medicare IM given by:   Date Additional Medicare IM given:   Additional Medicare IM given by:    Discharge Disposition:  HOME/SELF CARE  Per UR Regulation:  Reviewed for med. necessity/level of care/duration of stay  If discussed at [place] of Stay Meetings, dates discussed:    Comments:  Zanaria Morell RN, BSN, MSHL, CCM  Nurse - Case Manager,  ([address])  [phone]  10/292015 Specialty Med Review:  Brilinta - PER PT SHE PAYS $4-$12 FOR GENERIC & $45 FOR BRAND NAME Dispo Plan:  Home / Self care.

## 2014-10-17 NOTE — CV Procedure (Signed)
Cassie Campbell is a 73 y.o. female    [id number]  [id number] LOCATION:  FACILITY: [facility]  PHYSICIAN: Troy Sine, MD, [facility] 1941/10/02   DATE OF PROCEDURE:  10/17/2014    PERCUTANEOUS CORONARY INTERVENTION    HISTORY:    Cassie Campbell is a 73 y.o. female who had undergone diagnostic catheterization and attempt at PTCA of a severely calcified RCA yesterday by Dr. Fletcher Anon.  This attempted intervention was unsuccessful due to severe calcification.  She is now brought back to the laboratory today with plans to undergo high-speed rotational atherectomy of her multiple tandem calcified RCA stenoses   PROCEDURE: Very difficult procedure with planned  high-speed rotational atherectomy of a severely calcified RCA with multiple high-grade stenoses.  Temporary pacemaker insertion, very difficult procedure with inability to perform a rotational atherectomy run due to difficulty in roto wire advancement distal enough to allow for atherectomy, multiple attempts at PTCA were performed to allow for wire transfer with inability of the smallest 1.206 mm balloon to be advanced beyond the stenosis.  Ultimate aborted PTCA procedure.  The patient was brought to the [facility] cardiac catherization labaratory in the postabsorptive state. She was premedicated with Versed 2 mg and fentanyl 50 g. Her right groin was prepped and shaved in usual sterile fashion. Xylocaine 1% was used for local anesthesia.  A 6 French sheath was inserted into right femoral vein and a 7 [place] sheath was inserted into the right femoral artery in anticipation of need for possible multiple burs for the rotational atherectomy procedure.  In anticipation of performing high-speed rotational atherectomy to the RCA,  a temporary pacemaker was advanced via the right femoral vein sheath to the right ventricular apex.  Capture was excellent.  This proved to be an extremely difficult procedure.  Angiomax bolus plus infusion was  administered.  The patient had received Brilinta 180 mg yesterday and 90 mg this morning and received an additional 90 mg in the laboratory.  A 7 [place] guide was inserted.  A Roto floppy wire was advanced down the RCA but this was never able to cross the subtotal acute margin stenosis which proximal to this was severely calcified and arose after an angle in the RCA.  The Roto floppy wire was then removed and a long Prowater wire was inserted and  was able to be advanced distal to the multiple stenoses to the distal RCA.  A 1.256 mm sprinter  balloon was then inserted with plans to advance this to the distal RCA  and then allow for exchange of the [place] for a Roto floppy wire.  However, this small balloon was never able to cross the high-grade 99% calcified stenosis. Multiple dilatations up to 12 atm were made.  Despite this, the balloon could never cross the stenosis.  Consequently, the wire exchange could not be performed.  The balloon and Prowater were then removed.  An extra-support Rota wire was then inserted.  Ultimately, this was successful in crossing the 99% stenosis, but this wire seemed to have difficulty in traversing the multiple tandem distal stenoses and as a result of wire became somewhat kinked external to the body in an attempt to advance the wire more distally.  There also was back up difficulties with the guiding catheter despite being 7 [place].  Although the wire advanced distal enough so that the rotoblator  could be used at the acute margin high-grade stenosis unfortunately, due to a kink in the wire outside the body  the  burr was not able to be passed into the catheter. As a result the wire and 1.25  burr, which had been platformed outside the body prior to entering the catheter had to be withdrawn.  Another Roto extra support wire was then inserted, but this wire was never able to be passed distal enough to allow for the Trudee Kuster to be inserted since the joint in the wire was not distal  enough to allow for burring of the more proximal stenosis.  A Fielder XT wire was then inserted and this was able to cross the stenosis and advanced to the distal RCA.  Another attempt at using the 1.256 mm balloon was made but again this balloon despite the stronger wire was still unable to cross the stenoses.  Multiple additional inflations were made up to 13 atm.  The balloon was then removed.  Another extra support wire was then inserted and advanced next to the previously placed Fielder wire, but due to the very tight stenosis the Roto wire was unable to cross the stenosis with the other wire in place.  The [facility] wire was then removed and the Roto wire was able to be advanced short distance beyond the high-grade stenosis, but never could be advanced distal enough to allow for rotablation.  At this point, there was thrombus noted on the wire on removal despite the  Patient beig on Angiomax .  An ACT had earlier been in excess of 300.  The entire system was then removed.  A 6 [place] hockey-stick guide with sideholes was then inserted to allow for more catheter support.  Patient received an additional half bolus of Angiomax and also was started on Aggrastat.  The Roto wire was then reinserted and although this wire was able to cross the subtotal margin.  It again was unable to be advanced distal enough to allow for rotablation.  This was then removed and the [facility] XT wire was then reinserted and advanced to the very distal RCA.  The smallest balloon available, a 1.206 mm mini trek was then inserted, but again this balloon was not able to cross the stenosis and consequently was not able to be passed distal enough to allow for air exchange with the Roto floppy wire.  Attempts were then made to use a glide liner with support.  This point, been over 2-1/2 hours since the initiation of the procedure; Fluoroscopy time was 30.1 minutes.  After further deliberation with the patient at this point, I made the decision  to abort the procedure.  Since even if the balloon could ultimately cross the stenosis a very long procedure would still be necessary to complete the complex intervention.  Particularly with the patient's contrast load yesterday and today and the fact that she was pain-free.  The procedure was terminated.  She will be treated with Aggrastat and several additional hours of Angiomax in light of her significant thrombus burden during the procedure.  She left the laboratory with stable hemodynamics, chest pain-free.  HEMODYNAMICS:   Central Aorta: 120/76  ANGIOGRAPHY:  The right coronary artery was severely calcified.  There is 40% proximal stenosis.  There was 80% stenosis proximal to the acute margin and then there was a sharp angle with 99%.  Very calcified stenosis.  Beyond the acute margin.  There are tandem 9590 and 80% stenoses prior to giving rise to a small PDA branch.  The distal continuation branch was small caliber and gave rise to a very small PLA system.  Ongoing  multiple PTCA attempts with the 1.25 and 1.20 mm balloon.  There was no significant change noted.  Although the 1.25 mm bur had been advanced up to the catheter and platformed outside the body.  Due to the significant kink in the wire immediately outside the catheter.  The burr was never able to be inserted and rotational atherectomy was never able to be performed inside the body.   IMPRESSION:  Extremely difficult percutaneous coronary intervention with initial plans for high-speed rotational atherectomy to the RCA which ultimately was never able to be performed, and  aborted PTCA attempt of the extremely calcified subtotal RCA at the acute margina and multiple tandem distal stenoses  Troy Sine, MD, [facility] 10/17/2014 8:57 PM

## 2014-10-17 NOTE — Progress Notes (Signed)
ANTICOAGULATION CONSULT NOTE - Follow Up Consult  Pharmacy Consult for Heparin  Indication: chest pain/ACS, 2-vessel disease, s/p cath  Allergies  Allergen Reactions  . Codeine Phosphate Nausea Only  . Aspirin Swelling and Rash  . Diphenhydramine Hcl Rash  . Fish-Derived Products Swelling and Rash  . Penicillins Swelling and Rash    Patient Measurements: Height: 4\' 11"  (149.9 cm) Weight: 132 lb 0.9 oz (59.9 kg) IBW/kg (Calculated) : 43.2  Vital Signs: Temp: 98.2 F (36.8 C) (10/29 0423) Temp Source: Oral (10/29 0423) BP: 163/77 mmHg (10/29 0423) Pulse Rate: 65 (10/29 0423)  Labs:  Recent Labs  10/15/14 1145 10/15/14 1743 10/15/14 1853 10/16/14 0130 10/17/14 0335  HGB 14.4  --   --  12.8 13.2  HCT 40.9  --   --  36.8 38.2  PLT 357  --   --  357 369  APTT  --  29  --   --   --   LABPROT  --  14.0  --  14.5  --   INR  --  1.06  --  1.12  --   HEPARINUNFRC  --   --   --  0.25* 0.21*  CREATININE 0.57  --   --  0.81 0.63  TROPONINI 0.33*  --  2.39* 4.71*  --     Estimated Creatinine Clearance: 50.1 ml/min (by C-G formula based on Cr of 0.63).   Assessment: Sub-therapeutic heparin level, other labs as above, no issues per RN.   Goal of Therapy:  Heparin level 0.3-0.7 units/ml Monitor platelets by anticoagulation protocol: Yes   Plan:  -Increase heparin to 950 units/hr -1230 HL -Daily CBC/HL -Monitor for bleeding  Narda Bonds 10/17/2014,4:32 AM

## 2014-10-18 ENCOUNTER — Telehealth: Payer: Self-pay | Admitting: *Deleted

## 2014-10-18 ENCOUNTER — Encounter (HOSPITAL_COMMUNITY): Payer: Self-pay | Admitting: Physician Assistant

## 2014-10-18 DIAGNOSIS — K219 Gastro-esophageal reflux disease without esophagitis: Secondary | ICD-10-CM

## 2014-10-18 DIAGNOSIS — I2510 Atherosclerotic heart disease of native coronary artery without angina pectoris: Secondary | ICD-10-CM | POA: Diagnosis present

## 2014-10-18 DIAGNOSIS — E785 Hyperlipidemia, unspecified: Secondary | ICD-10-CM

## 2014-10-18 LAB — BASIC METABOLIC PANEL
Anion gap: 16 — ABNORMAL HIGH (ref 5–15)
BUN: 5 mg/dL — ABNORMAL LOW (ref 6–23)
CALCIUM: 8.6 mg/dL (ref 8.4–10.5)
CO2: 21 mEq/L (ref 19–32)
CREATININE: 0.55 mg/dL (ref 0.50–1.10)
Chloride: 104 mEq/L (ref 96–112)
GFR calc non Af Amer: >90 mL/min (ref >90)
GLUCOSE: 133 mg/dL — AB (ref 70–99)
Potassium: 3.3 mEq/L — ABNORMAL LOW (ref 3.7–5.3)
Sodium: 141 mEq/L (ref 137–147)

## 2014-10-18 LAB — CBC
HCT: 36.4 % (ref 36.0–46.0)
Hemoglobin: 12.6 g/dL (ref 12.0–15.0)
MCH: 29.9 pg (ref 26.0–34.0)
MCHC: 34.6 g/dL (ref 30.0–36.0)
MCV: 86.3 fL (ref 78.0–100.0)
PLATELETS: 356 10*3/uL (ref 150–400)
RBC: 4.22 MIL/uL (ref 3.87–5.11)
RDW: 14 % (ref 11.5–15.5)
WBC: 10.1 10*3/uL (ref 4.0–10.5)

## 2014-10-18 LAB — PLATELET COUNT: PLATELETS: 364 10*3/uL (ref 150–400)

## 2014-10-18 MED ORDER — POTASSIUM CHLORIDE CRYS ER 20 MEQ PO TBCR
40.0000 meq | EXTENDED_RELEASE_TABLET | Freq: Once | ORAL | Status: AC
  Administered 2014-10-18: 10:00:00 40 meq via ORAL
  Filled 2014-10-18: qty 2

## 2014-10-18 MED ORDER — PROMETHAZINE HCL 25 MG/ML IJ SOLN
12.5000 mg | Freq: Four times a day (QID) | INTRAMUSCULAR | Status: DC | PRN
  Administered 2014-10-18: 07:00:00 12.5 mg via INTRAVENOUS
  Filled 2014-10-18: qty 1

## 2014-10-18 MED ORDER — ALUM & MAG HYDROXIDE-SIMETH 200-200-20 MG/5ML PO SUSP
30.0000 mL | ORAL | Status: DC | PRN
  Administered 2014-10-18: 20:00:00 30 mL via ORAL
  Filled 2014-10-18: qty 30

## 2014-10-18 MED FILL — Sodium Chloride IV Soln 0.9%: INTRAVENOUS | Qty: 50 | Status: AC

## 2014-10-18 NOTE — Progress Notes (Signed)
[facility] PROGRESS NOTE  Cassie Campbell KDT:[id number] DOB: 08/27/1941 DOA: 10/15/2014 PCP: Christinia Gully, MD  Assessment/Plan: 1-N-STEMI;  Cath showing RCA ds. Unsuccessful intervention. Plans per cardiology  2-HTN;  3-Asthma; resume home medications.  Nausea: likely brillinta  Consultants:  Cardiology  HPI/Subjective: Feels nauseated  Objective: Filed Vitals:   10/18/14 1245  BP: 142/66  Pulse: 65  Temp: 97.9 F (36.6 C)  Resp: 18    Intake/Output Summary (Last 24 hours) at 10/18/14 1540 Last data filed at 10/18/14 1417  Gross per 24 hour  Intake    420 ml  Output   1050 ml  Net   -630 ml   Filed Weights   10/17/14 0005 10/17/14 0647 10/18/14 0020  Weight: 59.9 kg (132 lb 0.9 oz) 59.9 kg (132 lb 0.9 oz) 60.8 kg (134 lb 0.6 oz)    Exam:   General:  uncomfortable appearing  Cardiovascular: S 1, S 2 RRR  Respiratory: CTA  Abdomen: Bs present, soft, NT  Musculoskeletal: no edema.    Data Reviewed: Basic Metabolic Panel:  Recent Labs Lab 10/15/14 0845 10/15/14 1145 10/16/14 0130 10/17/14 0335 10/18/14 0040  NA 137  --  138 142 141  K 3.9  --  3.4* 3.6* 3.3*  CL 100  --  100 104 104  CO2  --   --  25 26 21   GLUCOSE 112*  --  96 93 133*  BUN 10  --  10 7 5*  CREATININE 0.60 0.57 0.81 0.63 0.55  CALCIUM  --   --  9.0 9.3 8.6   Liver Function Tests:  Recent Labs Lab 10/16/14 0130  AST 44*  ALT 18  ALKPHOS 59  BILITOT 0.5  PROT 6.6  ALBUMIN 3.2*   No results found for this basename: LIPASE, AMYLASE,  in the last 168 hours No results found for this basename: AMMONIA,  in the last 168 hours CBC:  Recent Labs Lab 10/15/14 0833 10/15/14 0845 10/15/14 1145 10/16/14 0130 10/17/14 0335 10/18/14 0040  WBC 7.4  --  7.4 8.0 7.9 10.1  NEUTROABS 5.4  --   --   --   --   --   HGB 16.0* 17.0* 14.4 12.8 13.2 12.6  HCT 44.8 50.0* 40.9 36.8 38.2 36.4  MCV 85.7  --  85.6 87.4 86.6 86.3  PLT 384  --  357 357 369 356  364    Cardiac Enzymes:  Recent Labs Lab 10/15/14 1145 10/15/14 1853 10/16/14 0130 10/17/14 2140  TROPONINI 0.33* 2.39* 4.71* 1.46*   BNP (last 3 results) No results found for this basename: PROBNP,  in the last 8760 hours CBG: No results found for this basename: GLUCAP,  in the last 168 hours  No results found for this or any previous visit (from the past 240 hour(s)).   Studies: No results found.  Scheduled Meds: . amLODipine  5 mg Oral Daily  . atorvastatin  80 mg Oral q1800  . budesonide-formoterol  2 puff Inhalation Daily  . famotidine  20 mg Oral QHS  . isosorbide mononitrate  60 mg Oral Daily  . metoprolol tartrate  25 mg Oral BID  . montelukast  10 mg Oral QHS  . pantoprazole  40 mg Oral Daily  . sodium chloride  3 mL Intravenous Q12H  . ticagrelor  90 mg Oral BID   Continuous Infusions: . sodium chloride 150 mL/hr at 10/17/14 2100     Time spent: 15 minutes.   Aurora L  [facility] Pager [phone]. If 7PM-7AM, please contact night-coverage at [email or web address], password [facility] 10/18/2014, 3:40 PM  LOS: 3 days

## 2014-10-18 NOTE — Progress Notes (Signed)
CARDIAC REHAB PHASE I   PRE:  Rate/Rhythm: 69 SR  BP:  Supine: 114/56  Sitting:   Standing:    SaO2:   MODE:  Ambulation: 450 ft   POST:  Rate/Rhythm: 85 SR  BP:  Supine:   Sitting: 133/73  Standing:    SaO2:  0900-1010 Pt walked 450 ft slowly with steady gait. No CP. Did c/o slight dizziness when up but stated she felt better after walk. MI education completed with pt who voiced understanding. Gave brillinta booklet. Discussed CRP 2 and pt gave permission to refer to [place] Phase 2. Pt is trying to follow a healthy diet and [place] for exercise. Gave ex ed for restarting ex slowly. Discussed NTG use. Pt very receptive to ed. We will follow up tomorrow.   Graylon Good, RN BSN  10/18/2014 10:06 AM

## 2014-10-18 NOTE — Telephone Encounter (Signed)
Attempted to call patient to leave a message informing her that her colonoscopy scheduled 10/29/14 has been cancelled. Patient currently in hospital with acute MI and coronary rotoblator procedure. Will need cardiac clearance and at least 3 months from MI prior to scheduling colonoscopy.

## 2014-10-18 NOTE — Progress Notes (Signed)
Patient Name: Cassie Campbell Date of Encounter: 10/18/2014     Principal Problem:   NSTEMI (non-ST elevated myocardial infarction) Active Problems:   Essential hypertension   GERD (gastroesophageal reflux disease)   Chest pain   Hyperlipidemia   Hypertension   CAD (coronary artery disease)    SUBJECTIVE  Was nauseated this AM and feeling better after phenergan. No CP or SOB. Dizziness today after lopressor.   CURRENT MEDS . amLODipine  5 mg Oral Daily  . atorvastatin  80 mg Oral q1800  . bivalirudin (ANGIOMAX) infusion 0.5 mg/mL (Non-ACS indications)  0.1 mg/kg/hr Intravenous Once  . budesonide-formoterol  2 puff Inhalation Daily  . famotidine  20 mg Oral QHS  . isosorbide mononitrate  60 mg Oral Daily  . metoprolol tartrate  25 mg Oral BID  . montelukast  10 mg Oral QHS  . pantoprazole  40 mg Oral Daily  . sodium chloride  3 mL Intravenous Q12H  . ticagrelor  90 mg Oral BID    OBJECTIVE  Filed Vitals:   10/18/14 0200 10/18/14 0300 10/18/14 0400 10/18/14 0436  BP: 121/61 117/66 115/70 119/62  Pulse:    58  Temp:    97.8 F (36.6 C)  TempSrc:    Axillary  Resp:    18  Height:      Weight:      SpO2:    96%    Intake/Output Summary (Last 24 hours) at 10/18/14 0805 Last data filed at 10/18/14 9470  Gross per 24 hour  Intake      0 ml  Output   1650 ml  Net  -1650 ml   Filed Weights   10/17/14 0005 10/17/14 0647 10/18/14 0020  Weight: 132 lb 0.9 oz (59.9 kg) 132 lb 0.9 oz (59.9 kg) 134 lb 0.6 oz (60.8 kg)    PHYSICAL EXAM  General: Pleasant, NAD. Neuro: Alert and oriented X 3. Moves all extremities spontaneously. Psych: Normal affect. HEENT:  Normal  Neck: Supple without bruits or JVD. Lungs:  Resp regular and unlabored, CTA. Heart: RRR no s3, s4, or murmurs. Abdomen: Soft, non-tender, non-distended, BS + x 4.  Extremities: No clubbing, cyanosis or edema. DP/PT/Radials 2+ and equal bilaterally.  Accessory Clinical Findings  CBC  Recent  Labs  10/15/14 0833  10/17/14 0335 10/18/14 0040  WBC 7.4  < > 7.9 10.1  NEUTROABS 5.4  --   --   --   HGB 16.0*  < > 13.2 12.6  HCT 44.8  < > 38.2 36.4  MCV 85.7  < > 86.6 86.3  PLT 384  < > 369 356  364  < > = values in this interval not displayed. Basic Metabolic Panel  Recent Labs  10/17/14 0335 10/18/14 0040  NA 142 141  K 3.6* 3.3*  CL 104 104  CO2 26 21  GLUCOSE 93 133*  BUN 7 5*  CREATININE 0.63 0.55  CALCIUM 9.3 8.6   Liver Function Tests  Recent Labs  10/16/14 0130  AST 44*  ALT 18  ALKPHOS 59  BILITOT 0.5  PROT 6.6  ALBUMIN 3.2*    Cardiac Enzymes  Recent Labs  10/15/14 1853 10/16/14 0130 10/17/14 2140  TROPONINI 2.39* 4.71* 1.46*    D-Dimer  Recent Labs  10/16/14 0130  DDIMER 0.40    TELE  NSR   Radiology/Studies  Dg Chest Port 1 View  10/15/2014   CLINICAL DATA:  One day history of chest pain; history of asthma and  hypertension  EXAM: PORTABLE CHEST - 1 VIEW  COMPARISON:  November 19, 2013  FINDINGS: There is a small area of infiltrate in the right base region. Elsewhere lungs are clear. Heart size and pulmonary vascularity are normal. No pneumothorax. No adenopathy. There is postoperative change in the lower cervical spine region.  IMPRESSION: Infiltrate right base.   Electronically Signed   By: Lowella Grip M.D.   On: 10/15/2014 08:52      Cardiac Catheterization Procedure Note  Name: Cassie Campbell  MRN: [id number]  DOB: 1941/08/30  Procedure: Left Heart Cath, Selective Coronary Angiography, attempted unsuccessful PTCA of the RCA due to inability to dilate the mid lesion (heavy calcifications).  Indication: NSTEMI  Medications:  Sedation: 2 mg IV Versed, 175 ml mcg IV Fentanyl  Contrast: 75 ml Omnipaque  Procedural Details: The right wrist was prepped, draped, and anesthetized with 1% lidocaine. Using the modified Seldinger technique, a 5 French Slender sheath was introduced into the right radial artery. 3 mg of  verapamil was administered through the sheath, weight-based unfractionated heparin was administered intravenously. A Jackie catheter was used for selective coronary angiography and to measure LV pressure. Catheter exchanges were performed over an exchange length guidewire. There were no immediate procedural complications.  Procedural Findings:  Hemodynamics:  AO: 126/60 mmHg  LV: 126/6 mmHg  LVEDP: 16 mmHg  Coronary angiography:  Coronary dominance: right  Left Main: mildly calcified with no significant obstructive disease.  Left Anterior Descending (LAD): Heavily calcified throughout its course. There is diffuse 20% disease proximally and 40-50% disease in the midsegment. There is 70% stenosis in multiple areas distally all the way close to the apex  1st diagonal (D1): Small in size with minor irregularities.  2nd diagonal (D2): Small in size with minor irregularities.  3rd diagonal (D3): Small in size with minor irregularities.  Circumflex (LCx): Normal in size and nondominant. The vessel is moderately calcified with 20-30% stenosis in the midsegment.  1st obtuse marginal: Small in size with 95% proximal stenosis.  2nd obtuse marginal: Large in size with minor irregularities.  3rd obtuse marginal: Normal in size with minor irregularities.  AV groove continuation segment: Medium in size with minor irregularities.  Right Coronary Artery: Normal in size and dominant. The vessel is heavily calcified especially in the mid and distal segment. There is 20% proximal stenosis. There is 95% heavily calcified stenosis in the midsegment. There is 99% discrete stenosis in the mid to distal segment with TIMI 1 flow throughout the vessel. The distal vessel into the PDA is underfilling and could not be evaluated although there might be some ostial PDA disease. Left ventriculography: Was not performed. EF is normal by echo.  PCI Note: Following the diagnostic procedure, the decision was made to proceed with  PCI. Weight-based bivalirudin was given for anticoagulation. Once a therapeutic ACT was achieved, a 6 [place] JR4 guide catheter was inserted. A run through coronary guidewire was used to cross the lesion. I could not cross the mid stenosis with a 2.0 balloon. I was able to cross with a 1.2 balloon and inflated to 10 atm. I could not advance the balloon any further and could not advance any other balloons in spite of using a buddy wire and deep seating the guide. There was actually improved flow just with 1 inflation but still with significant disease. I decided to stop the procedure. The patient was having severe pain in the right arm with a very manipulation of the catheter. Radial angiography performed  through the sheath at the end showed no evidence of perforation although there is significant tortuosity. I decided that the best option would be to stage atherectomy of the RCA via the femoral artery approach. The patient tolerated the procedure well. There were no immediate procedural complications. A TR band was used for radial hemostasis. The patient was transferred to the post catheterization recovery area for further monitoring.  PCI Data:  Vessel - RCA/Segment - mid  Percent Stenosis (pre) 95%  TIMI-flow 1  balloon angioplasty only (not successful)  Percent Stenosis (post) 90  TIMI-flow (post) 3  Final Conclusions:  1. Significant 2 vessel coronary artery disease. The distal LAD is diffusely diseased and not suitable for revascularization. The culprit for non-ST elevation myocardial infarction is likely the right coronary artery which is heavily calcified.  2. Normal LV systolic function by echo. Mildly elevated left ventricular end-diastolic pressure.  3. Unsuccessful RCA PCI due to inability to dilate the lesion as the result of heavy calcifications.  Recommendations:  Atherectomy of the right coronary artery via the femoral artery. I will resume heparin in 8 hours. The patient was loaded with  Brilinta . She is allergic to aspirin.    2D ECHO: 10/15/2014 LV EF: 55% - 60% Study Conclusions - Adverse outcomes: Technically difficult study with limited echo windows. - Left ventricle: The cavity size was normal. Wall thickness was increased in a pattern of mild LVH. Systolic function was normal. The estimated ejection fraction was in the range of 55% to 60%. Wall motion was normal; there were no regional wall motion abnormalities. The study is not technically sufficient to allow evaluation of LV diastolic function. - Mitral valve: Calcified annulus. There was trivial regurgitation. - Left atrium: The atrium was normal in size. - Inferior vena cava: The vessel was normal in size. The respirophasic diameter changes were in the normal range (>= 50%), consistent with normal central venous pressure. Impressions:    Patient seen and examined. Agree with assessment and plan.   Troy Sine, MD, [facility] 10/18/2014 12:03 PM - LVEF 55-60%, mild LVH, indeterminate diastolic function, normal LA size.     ASSESSMENT AND PLAN  73 year old female with past medical history of hypertension, hyperlipidemia, obesity, severe chronic asthma, depression, GERD and positional vertigo presented to [facility] on 10/15/14 with chest pain and NSTEMI  CAD/NSTEMI- pk troponin 4.71 -- s/p LHC on 10/16/14 which revealed  1. Significant 2 vessel CAD. The distal LAD is diffusely diseased and not suitable for revascularization. The culprit for NSTEMI is likely the RCA which is heavily calcified.   2. Normal LV systolic function by echo. Mildly elevated left ventricular end-diastolic pressure.   3. Unsuccessful RCA PCI due to inability to dilate the lesion as the result of heavy calcifications. -- 2d ECHO with LVEF 55-60%, mild LVH, indeterminate diastolic function, normal LA size. -- Continue Brilinta without ASA (due to ASA allergy), Lopressor 103m BID, and statin  -- Apparently has had dizziness in the  past with lopressor and did have some dizziness today after a dose.   HTN; SBP had been soft and Norvasc, spironolactone, clonidine and held. Now BP much improved and she has been resumed on Lopressor and Norvasc -- Would continue to hold clonidine as she will need to continue BB and had some bradycardia during her admission  -- Could add home spiro back today, MD to see  Hypokalemia- will supplement this AM  Dispo- will follow with Dr. MJulianne Handler Given Brilinta RX. Needs care management  consult for Brilinta.   Judy Pimple PA-C  Pager [phone]  Patient seen and examined. Agree with assessment and plan. Long discussion again with patient concerning the complexity of her 2 prior procedures due to severely stenosed calcified lesions. She had  Thrombus during the procedure despite angiomax for which tirofiban was added. Will complete 18 hr infusion of aggrastat today. Now on low dose BB with lopressor at 25 mg bid; HR in the 70's and BP in the 438'O systolic. Will resume oral Imdur since IV NTG is off. Titrate doses as BP and HR allow. Will add ranolazine initially at 500 mg bid with titration to 1000 mg bid as tolerates. ? Medical therapy vs another  future attempt at HSRA/PCI, since vessels do not appear suited for CABG. Continue with P2Y12 inhibitor since apparently cannot take ASA.   Troy Sine, MD, [facility] 10/18/2014 12:03 PM

## 2014-10-18 NOTE — Progress Notes (Signed)
Site area: right groin  Site Prior to Removal:  Level 0  Pressure Applied For 30:00 MINUTES    Minutes Beginning at 0000  Manual:   Yes.    Patient Status During Pull:  Pt alert and oriented X 4  Post Pull Groin Site:  Level 0  Post Pull Instructions Given:  Yes.    Post Pull Pulses Present:  Yes.    Dressing Applied:  Yes.    Comments:  Pt tolerated removal of sheath without complications or distress will continue to monitor patient.

## 2014-10-19 LAB — BASIC METABOLIC PANEL
ANION GAP: 12 (ref 5–15)
BUN: 5 mg/dL — ABNORMAL LOW (ref 6–23)
CALCIUM: 9 mg/dL (ref 8.4–10.5)
CHLORIDE: 106 meq/L (ref 96–112)
CO2: 26 mEq/L (ref 19–32)
CREATININE: 0.62 mg/dL (ref 0.50–1.10)
GFR calc Af Amer: >90 mL/min (ref >90)
GFR, EST NON AFRICAN AMERICAN: 88 mL/min — AB (ref >90)
Glucose, Bld: 93 mg/dL (ref 70–99)
Potassium: 3.5 mEq/L — ABNORMAL LOW (ref 3.7–5.3)
Sodium: 144 mEq/L (ref 137–147)

## 2014-10-19 LAB — CBC
HEMATOCRIT: 35.1 % — AB (ref 36.0–46.0)
Hemoglobin: 11.8 g/dL — ABNORMAL LOW (ref 12.0–15.0)
MCH: 29.2 pg (ref 26.0–34.0)
MCHC: 33.6 g/dL (ref 30.0–36.0)
MCV: 86.9 fL (ref 78.0–100.0)
PLATELETS: 335 10*3/uL (ref 150–400)
RBC: 4.04 MIL/uL (ref 3.87–5.11)
RDW: 14.3 % (ref 11.5–15.5)
WBC: 8.2 10*3/uL (ref 4.0–10.5)

## 2014-10-19 MED ORDER — NITROGLYCERIN 0.4 MG SL SUBL: 0.4000 mg | SUBLINGUAL_TABLET | SUBLINGUAL | Status: DC | PRN

## 2014-10-19 MED ORDER — ATORVASTATIN CALCIUM 80 MG PO TABS: 80.0000 mg | ORAL_TABLET | Freq: Every day | ORAL | Status: DC

## 2014-10-19 MED ORDER — ISOSORBIDE MONONITRATE ER 60 MG PO TB24: 60.0000 mg | ORAL_TABLET | Freq: Every day | ORAL | Status: DC

## 2014-10-19 MED ORDER — TICAGRELOR 90 MG PO TABS: 90.0000 mg | ORAL_TABLET | Freq: Two times a day (BID) | ORAL | Status: DC

## 2014-10-19 MED ORDER — RANOLAZINE ER 500 MG PO TB12: 500.0000 mg | ORAL_TABLET | Freq: Two times a day (BID) | ORAL | Status: DC

## 2014-10-19 MED ORDER — METOPROLOL TARTRATE 25 MG PO TABS: 25.0000 mg | ORAL_TABLET | Freq: Two times a day (BID) | ORAL | Status: DC

## 2014-10-19 NOTE — Discharge Instructions (Signed)
Call [facility] at [phone] if any bleeding, swelling or drainage at cath site.  May shower, no tub baths for 48 hours for groin sticks.  No lifting over 5 pounds for 1 week no driving for 5 days.  Heart Healthy diet.  Take 1 NTG, under your tongue, while sitting.  If no relief of pain may repeat NTG, one tab every 5 minutes up to 3 tablets total over 15 minutes.  If no relief CALL 911.  If you have dizziness/lightheadness  while taking NTG, stop taking and call 911.

## 2014-10-19 NOTE — Progress Notes (Signed)
CARDIAC REHAB PHASE I   PRE:  Rate/Rhythm: 82 SR    BP: sitting 121/68    SaO2:   MODE:  Ambulation: 1000 ft   POST:  Rate/Rhythm: 112 ST    BP: sitting wouldn't register (machine issue)     SaO2:   Tolerated well. Increased distance and pace. Pt sts she was somewhat DOE toward end but denied angina. Reviewed ed. Campbell, Cassie, ACSM 10/19/2014 8:37 AM

## 2014-10-19 NOTE — Progress Notes (Signed)
Patient ID: SERAH NICOLETTI, female   DOB: 06/13/1941, 73 y.o.   MRN: [id number]   Patient Name: Cassie Campbell Date of Encounter: 10/19/2014     Principal Problem:   NSTEMI (non-ST elevated myocardial infarction) Active Problems:   Essential hypertension   GERD (gastroesophageal reflux disease)   Chest pain   Hyperlipidemia   Hypertension   CAD (coronary artery disease)    SUBJECTIVE  No chest pain or sob.   CURRENT MEDS . amLODipine  5 mg Oral Daily  . atorvastatin  80 mg Oral q1800  . budesonide-formoterol  2 puff Inhalation Daily  . famotidine  20 mg Oral QHS  . isosorbide mononitrate  60 mg Oral Daily  . metoprolol tartrate  25 mg Oral BID  . montelukast  10 mg Oral QHS  . pantoprazole  40 mg Oral Daily  . sodium chloride  3 mL Intravenous Q12H  . ticagrelor  90 mg Oral BID    OBJECTIVE  Filed Vitals:   10/18/14 2035 10/18/14 2342 10/19/14 0357 10/19/14 0540  BP: 128/69 120/62 143/76   Pulse:  71 71   Temp:  98.6 F (37 C) 98.2 F (36.8 C)   TempSrc:  Oral Oral   Resp:  16 18   Height:      Weight:    130 lb 11.7 oz (59.3 kg)  SpO2:  96% 95%     Intake/Output Summary (Last 24 hours) at 10/19/14 0724 Last data filed at 10/19/14 0402  Gross per 24 hour  Intake   1960 ml  Output   3000 ml  Net  -1040 ml   Filed Weights   10/17/14 0647 10/18/14 0020 10/19/14 0540  Weight: 132 lb 0.9 oz (59.9 kg) 134 lb 0.6 oz (60.8 kg) 130 lb 11.7 oz (59.3 kg)    PHYSICAL EXAM  General: Pleasant, NAD. Neuro: Alert and oriented X 3. Moves all extremities spontaneously. Psych: Normal affect. HEENT:  Normal  Neck: Supple without bruits or JVD. Lungs:  Resp regular and unlabored, CTA. Heart: RRR no s3, s4, or murmurs. Abdomen: Soft, non-tender, non-distended, BS + x 4.  Extremities: No clubbing, cyanosis or edema. DP/PT/Radials 2+ and equal bilaterally.  Accessory Clinical Findings  CBC  Recent Labs  10/18/14 0040 10/19/14 0518  WBC 10.1 8.2  HGB  12.6 11.8*  HCT 36.4 35.1*  MCV 86.3 86.9  PLT 356  364 786   Basic Metabolic Panel  Recent Labs  10/18/14 0040 10/19/14 0518  NA 141 144  K 3.3* 3.5*  CL 104 106  CO2 21 26  GLUCOSE 133* 93  BUN 5* 5*  CREATININE 0.55 0.62  CALCIUM 8.6 9.0   Liver Function Tests No results found for this basename: AST, ALT, ALKPHOS, BILITOT, PROT, ALBUMIN,  in the last 72 hours No results found for this basename: LIPASE, AMYLASE,  in the last 72 hours Cardiac Enzymes  Recent Labs  10/17/14 2140  TROPONINI 1.46*   BNP No components found with this basename: POCBNP,  D-Dimer No results found for this basename: DDIMER,  in the last 72 hours Hemoglobin A1C No results found for this basename: HGBA1C,  in the last 72 hours Fasting Lipid Panel No results found for this basename: CHOL, HDL, LDLCALC, TRIG, CHOLHDL, LDLDIRECT,  in the last 72 hours Thyroid Function Tests No results found for this basename: TSH, T4TOTAL, FREET3, T3FREE, THYROIDAB,  in the last 72 hours  TELE  nsr  Radiology/Studies  Dg Chest [facility] [address]  10/15/2014   CLINICAL DATA:  One day history of chest pain; history of asthma and hypertension  EXAM: PORTABLE CHEST - 1 VIEW  COMPARISON:  November 19, 2013  FINDINGS: There is a small area of infiltrate in the right base region. Elsewhere lungs are clear. Heart size and pulmonary vascularity are normal. No pneumothorax. No adenopathy. There is postoperative change in the lower cervical spine region.  IMPRESSION: Infiltrate right base.   Electronically Signed   By: Lowella Grip M.D.   On: 10/15/2014 08:52    ASSESSMENT AND PLAN  1. NSTEMI 2. Unsuccessful PCI of RCA due to heavy Ca++ of RCA which was though to be the infarct artery 3. HTN Rec: continue medical therapy as outlined by Dr. Claiborne Billings. Followup Dr. Julianne Handler. Additional revascularization attempts to be decided based on severity of patient's symptoms.   Markos Theil,M.D.  10/19/2014 7:24 AM

## 2014-10-19 NOTE — Discharge Summary (Addendum)
PATIENT DETAILS Name: Cassie Campbell Age: 73 y.o. Sex: female Date of Birth: August 17, 1941 MRN: [id number]. Admitting Physician: Donne Hazel, MD MPN:TIRWERX [name], MD  Admit Date: 10/15/2014 Discharge date: 10/19/2014  Recommendations for Outpatient Follow-up:  Please check CBC/BMET at next follow up  PRIMARY DISCHARGE DIAGNOSIS:  Principal Problem:   NSTEMI (non-ST elevated myocardial infarction) Active Problems:   Essential hypertension   GERD (gastroesophageal reflux disease)   Chest pain   Hyperlipidemia   Hypertension   CAD (coronary artery disease)      PAST MEDICAL HISTORY: Past Medical History  Diagnosis Date  . Benign positional vertigo   . Osteopenia     dexa 08/22/07 AP spine + 1.1, left femur -1.3, right femur -.8; dexa 10/06/09 +1.6, left femur =1.6, right femur -.  . Hypertension   . Hyperlipidemia     <130 ldl pos fm hx, bp  . Obesity   . Asthma     xolair s 8/05 ?11/07; mastered hfa 12/20/08  . Allergic rhinitis   . Ruptured disc, cervical   . Depression   . PONV (postoperative nausea and vomiting)   . CAD (coronary artery disease)     a. 09/2014 NSTEMI s/p LHC with sig 2V dz. dLAD diffusely diseased and not suitable for PCI. unsuccessful RCA PCI d/t heavy calcifications    DISCHARGE MEDICATIONS: Current Discharge Medication List    START taking these medications   Details  isosorbide mononitrate (IMDUR) 60 MG 24 hr tablet Take 1 tablet (60 mg total) by mouth daily. Qty: 60 tablet, Refills: 0    metoprolol tartrate (LOPRESSOR) 25 MG tablet Take 1 tablet (25 mg total) by mouth 2 (two) times daily. Qty: 120 tablet, Refills: 0    nitroGLYCERIN (NITROSTAT) 0.4 MG SL tablet Place 1 tablet (0.4 mg total) under the tongue every 5 (five) minutes as needed for chest pain. Qty: 30 tablet, Refills: 0    ranolazine (RANEXA) 500 MG 12 hr tablet Take 1 tablet (500 mg total) by mouth 2 (two) times daily. Qty: 120 tablet, Refills: 0    ticagrelor  (BRILINTA) 90 MG TABS tablet Take 1 tablet (90 mg total) by mouth 2 (two) times daily. Qty: 120 tablet, Refills: 0      CONTINUE these medications which have CHANGED   Details  atorvastatin (LIPITOR) 80 MG tablet Take 1 tablet (80 mg total) by mouth daily at 6 PM. Qty: 60 tablet, Refills: 0      CONTINUE these medications which have NOT CHANGED   Details  albuterol (PROAIR HFA) 108 (90 BASE) MCG/ACT inhaler Inhale 1-2 puffs into the lungs every 4 (four) hours as needed for wheezing.     amLODipine (NORVASC) 10 MG tablet take 1 tablet by mouth once daily Qty: 90 tablet, Refills: 3    beclomethasone (QVAR) 80 MCG/ACT inhaler Inhale 2 puffs into the lungs 2 (two) times daily.     budesonide-formoterol (SYMBICORT) 160-4.5 MCG/ACT inhaler Inhale 2 puffs into the lungs daily.     Calcium Carbonate-Vit D-Min 1200-1000 MG-UNIT CHEW Chew 1 tablet by mouth daily.     cetirizine (ZYRTEC) 10 MG tablet Take 10 mg by mouth at bedtime as needed for allergies.     clotrimazole-betamethasone (LOTRISONE) cream Apply 1 application topically 2 (two) times daily as needed (itching).     dextromethorphan-guaiFENesin (MUCINEX DM) 30-600 MG per 12 hr tablet Take 1-2 tablets by mouth every 12 (twelve) hours as needed (cough).     famotidine (PEPCID) 20 MG tablet  Take 20 mg by mouth at bedtime.     meclizine (ANTIVERT) 50 MG tablet Take 25-50 mg by mouth 3 (three) times daily as needed for dizziness.     mometasone (NASONEX) 50 MCG/ACT nasal spray Place 1-2 sprays into the nose 2 (two) times daily as needed (allergies).     montelukast (SINGULAIR) 10 MG tablet Take 10 mg by mouth at bedtime.    Multiple Vitamin (MULTIVITAMIN) capsule Take 1 capsule by mouth daily.      omeprazole (PRILOSEC) 20 MG capsule Take 20 mg by mouth daily.     oxymetazoline (AFRIN) 0.05 % nasal spray Place 2 sprays into both nostrils every 12 (twelve) hours as needed for congestion.     sodium chloride (OCEAN) 0.65 % nasal  spray Place 1 spray into the nose every 4 (four) hours as needed for congestion.     traMADol (ULTRAM) 50 MG tablet take 1 to 2 tablets by mouth every 6 to 8 hours if needed for pain Qty: 60 tablet, Refills: 0      STOP taking these medications     cloNIDine (CATAPRES) 0.1 MG tablet      potassium chloride SA (K-DUR,KLOR-CON) 20 MEQ tablet      spironolactone-hydrochlorothiazide (ALDACTAZIDE) 25-25 MG per tablet         ALLERGIES:   Allergies  Allergen Reactions  . Codeine Phosphate Nausea Only  . Aspirin Swelling and Rash  . Diphenhydramine Hcl Rash  . Fish-Derived Products Swelling and Rash  . Penicillins Swelling and Rash    BRIEF HPI:  See H&P, Labs, Consult and Test reports for all details in brief, patient was admitted for chest pain from NSTEMI  CONSULTATIONS:   cardiology  PERTINENT RADIOLOGIC STUDIES: Dg Chest Port 1 View  10/15/2014   CLINICAL DATA:  One day history of chest pain; history of asthma and hypertension  EXAM: PORTABLE CHEST - 1 VIEW  COMPARISON:  November 19, 2013  FINDINGS: There is a small area of infiltrate in the right base region. Elsewhere lungs are clear. Heart size and pulmonary vascularity are normal. No pneumothorax. No adenopathy. There is postoperative change in the lower cervical spine region.  IMPRESSION: Infiltrate right base.   Electronically Signed   By: Lowella Grip M.D.   On: 10/15/2014 08:52     PERTINENT LAB RESULTS: CBC:  Recent Labs  10/18/14 0040 10/19/14 0518  WBC 10.1 8.2  HGB 12.6 11.8*  HCT 36.4 35.1*  PLT 356  364 335   CMET CMP     Component Value Date/Time   NA 144 10/19/2014 0518   K 3.5* 10/19/2014 0518   CL 106 10/19/2014 0518   CO2 26 10/19/2014 0518   GLUCOSE 93 10/19/2014 0518   GLUCOSE 109* 10/05/2006 1022   BUN 5* 10/19/2014 0518   CREATININE 0.62 10/19/2014 0518   CALCIUM 9.0 10/19/2014 0518   PROT 6.6 10/16/2014 0130   ALBUMIN 3.2* 10/16/2014 0130   AST 44* 10/16/2014 0130   ALT  18 10/16/2014 0130   ALKPHOS 59 10/16/2014 0130   BILITOT 0.5 10/16/2014 0130   GFRNONAA 88* 10/19/2014 0518   GFRAA >90 10/19/2014 0518    GFR Estimated Creatinine Clearance: 49.8 ml/min (by C-G formula based on Cr of 0.62). No results found for this basename: LIPASE, AMYLASE,  in the last 72 hours  Recent Labs  10/17/14 2140  TROPONINI 1.46*   No components found with this basename: POCBNP,  No results found for this basename: DDIMER,  in the last 72 hours No results found for this basename: HGBA1C,  in the last 72 hours No results found for this basename: CHOL, HDL, LDLCALC, TRIG, CHOLHDL, LDLDIRECT,  in the last 72 hours No results found for this basename: TSH, T4TOTAL, FREET3, T3FREE, THYROIDAB,  in the last 72 hours No results found for this basename: VITAMINB12, FOLATE, FERRITIN, TIBC, IRON, RETICCTPCT,  in the last 72 hours Coags: No results found for this basename: PT, INR,  in the last 72 hours Microbiology: No results found for this or any previous visit (from the past 240 hour(s)).   BRIEF HOSPITAL COURSE:   Principal Problem:   NSTEMI (non-ST elevated myocardial infarction):admitted with chest pain, troponins were elevated. Cardiology was consulted, patient underwent Cardiac Cath X 2 with unsuccessful PCI.Current recommendations from cardiology are to try maximal medical treatment, with additional revascularization attempts to be decided based on severity of patient's symptoms.Since patient allergic to ASA, started on Brillinta, also on Metoprolol, Imdur and statin. Currently doing well and ambulating in room and hallway without chest pain. Cardiology has seen patient today,and has cleared patient for discharge.Cardiology will arrange for outpatient follow up.  Active Problems:   Essential hypertension:stable. Medication adjusted, see above med list.   GERD (gastroesophageal reflux disease):stable, c/w PPI   Hyperlipidemia:stable, increased Lipitor to 80 mg  daily  TODAY-DAY OF DISCHARGE:  Subjective:   Vonna Brabson today has no headache,no chest abdominal pain,no new weakness tingling or numbness, feels much better wants to go home today.   Objective:   Blood pressure 121/68, pulse 83, temperature 98.3 F (36.8 C), temperature source Oral, resp. rate 18, height 4\' 11"  (1.499 m), weight 59.3 kg (130 lb 11.7 oz), last menstrual period 12/21/1999, SpO2 96.00%.  Intake/Output Summary (Last 24 hours) at 10/19/14 0854 Last data filed at 10/19/14 0841  Gross per 24 hour  Intake   1780 ml  Output   3400 ml  Net  -1620 ml   Filed Weights   10/17/14 0647 10/18/14 0020 10/19/14 0540  Weight: 59.9 kg (132 lb 0.9 oz) 60.8 kg (134 lb 0.6 oz) 59.3 kg (130 lb 11.7 oz)    Exam Awake Alert, Oriented *3, No new F.N deficits, Normal affect [place].AT,PERRAL Supple Neck,No JVD, No cervical lymphadenopathy appriciated.  Symmetrical Chest wall movement, Good air movement bilaterally, CTAB RRR,No Gallops,Rubs or new Murmurs, No Parasternal Heave +ve B.Sounds, Abd Soft, Non tender, No organomegaly appriciated, No rebound -guarding or rigidity. No Cyanosis, Clubbing or edema, No new Rash or bruise  DISCHARGE CONDITION: Stable  DISPOSITION: Home  DISCHARGE INSTRUCTIONS:    Activity:  As tolerated with Full fall precautions use walker/cane & assistance as needed  Diet recommendation: Heart Healthy diet  Discharge Instructions   Amb Referral to Cardiac Rehabilitation    Complete by:  As directed      Call MD for:    Complete by:  As directed   Chest pain     Diet - low sodium heart healthy    Complete by:  As directed      Increase activity slowly    Complete by:  As directed            Follow-up Information   Follow up with Shelva Majestic A, MD. (the office will call with date and time for appt with Dr. Claiborne Billings or his PA/NP )    Specialty:  Cardiology   Contact information:   [address] [place]  [address] [phone]  Follow up with Christinia Gully, MD. Schedule an appointment as soon as possible for a visit in 1 week.   Specialty:  Pulmonary Disease   Contact information:   [address] [address] [phone]      Total Time spent on discharge equals 25 minutes.  SignedOren Binet 10/19/2014 8:54 AM

## 2014-10-21 ENCOUNTER — Encounter (HOSPITAL_COMMUNITY): Payer: Self-pay | Admitting: Physician Assistant

## 2014-10-23 ENCOUNTER — Telehealth: Payer: Self-pay | Admitting: Cardiovascular Disease

## 2014-10-23 DIAGNOSIS — R319 Hematuria, unspecified: Secondary | ICD-10-CM

## 2014-10-23 NOTE — Telephone Encounter (Signed)
Spoke to Pt  Cassie Campbell was discharged Saturday, 10/31 after having a Non-STEMI. She is calling in regards to some symptoms she is experiencing and if it is related to new medications started since MI.   She is having: 1. Brownish/dark reddish color in her urine. Pt reports not having any pain or burning with urination. No difficulty voiding or frequency. Pt stated that she noticed this on Sunday, Monday and Tuesday.  2. Pt is also experiencing some discomfort and irritation in stomach. Pt stated that Prilosec and Pepcid (both currently prescribed to pt) are giving some relief of that.   Pt questioned if she should continue taking Brilinta. Advised pt to continue taking medication and that she should only discontinue if MD states to do so. Informed that MD would be given this information and we would contact her afterwards.

## 2014-10-23 NOTE — Telephone Encounter (Signed)
Ms.Yasin is calling because she just had a heart attack and was release from the hospital this past Saturday and have questions about a mediation that she is taking and she is seeing blood in her Urine . Please Call   Thanks

## 2014-10-23 NOTE — Telephone Encounter (Signed)
Very important that she continue the Brilinta. OK to take the Prilosec and Pepcid twice daily if once daily is insufficient for symptom control. Check UA please

## 2014-10-23 NOTE — Telephone Encounter (Signed)
Pt stated understanding that she should not stop Brilinta, and that she could take Pepcid and Prilosec BID. Pt will come to lab today to do UA. Directions given. Told her will call after resulted and MD has addressed.   Order placed.

## 2014-10-24 ENCOUNTER — Telehealth: Payer: Self-pay | Admitting: *Deleted

## 2014-10-24 DIAGNOSIS — Z79899 Other long term (current) drug therapy: Secondary | ICD-10-CM

## 2014-10-24 LAB — URINALYSIS
Glucose, UA: NEGATIVE mg/dL
HGB URINE DIPSTICK: NEGATIVE
Nitrite: NEGATIVE
Protein, ur: NEGATIVE mg/dL
SPECIFIC GRAVITY, URINE: 1.028 (ref 1.005–1.030)
UROBILINOGEN UA: 0.2 mg/dL (ref 0.0–1.0)
pH: 5.5 (ref 5.0–8.0)

## 2014-10-24 NOTE — Telephone Encounter (Signed)
Patient notified of U/A results and asked to go to the lab for CMET.  She is not feeling up to it today but will go tomorrow.  Order placed.

## 2014-10-24 NOTE — Telephone Encounter (Signed)
-----   Message from Sanda Klein, MD sent at 10/24/2014 10:49 AM EST ----- No sign of blood in urine. Continue Brilinta. The color of her urine may be due to bilirubin (jaundic). Check CMET please

## 2014-10-24 NOTE — Telephone Encounter (Signed)
Agree with Dr Lurline Del recommendation

## 2014-10-25 LAB — COMPREHENSIVE METABOLIC PANEL
ALBUMIN: 3.9 g/dL (ref 3.5–5.2)
ALT: 20 U/L (ref 0–35)
AST: 18 U/L (ref 0–37)
Alkaline Phosphatase: 63 U/L (ref 39–117)
BUN: 9 mg/dL (ref 6–23)
CALCIUM: 9.7 mg/dL (ref 8.4–10.5)
CO2: 24 mEq/L (ref 19–32)
CREATININE: 0.64 mg/dL (ref 0.50–1.10)
Chloride: 102 mEq/L (ref 96–112)
GLUCOSE: 89 mg/dL (ref 70–99)
POTASSIUM: 4.4 meq/L (ref 3.5–5.3)
Sodium: 140 mEq/L (ref 135–145)
Total Bilirubin: 0.6 mg/dL (ref 0.2–1.2)
Total Protein: 7 g/dL (ref 6.0–8.3)

## 2014-10-29 ENCOUNTER — Encounter: Payer: Medicare Other | Admitting: Gastroenterology

## 2014-10-29 ENCOUNTER — Encounter: Payer: Self-pay | Admitting: Cardiovascular Disease

## 2014-10-29 ENCOUNTER — Ambulatory Visit (INDEPENDENT_AMBULATORY_CARE_PROVIDER_SITE_OTHER): Payer: Medicare Other | Admitting: Cardiovascular Disease

## 2014-10-29 VITALS — BP 110/70 | HR 88 | Ht 59.0 in | Wt 128.2 lb

## 2014-10-29 DIAGNOSIS — J4540 Moderate persistent asthma, uncomplicated: Secondary | ICD-10-CM

## 2014-10-29 DIAGNOSIS — I214 Non-ST elevation (NSTEMI) myocardial infarction: Secondary | ICD-10-CM

## 2014-10-29 DIAGNOSIS — I10 Essential (primary) hypertension: Secondary | ICD-10-CM

## 2014-10-29 DIAGNOSIS — I25119 Atherosclerotic heart disease of native coronary artery with unspecified angina pectoris: Secondary | ICD-10-CM

## 2014-10-29 DIAGNOSIS — I2510 Atherosclerotic heart disease of native coronary artery without angina pectoris: Secondary | ICD-10-CM

## 2014-10-29 DIAGNOSIS — E785 Hyperlipidemia, unspecified: Secondary | ICD-10-CM

## 2014-10-29 MED ORDER — METOPROLOL TARTRATE 25 MG PO TABS: ORAL_TABLET | ORAL | Status: DC

## 2014-10-29 MED ORDER — RANOLAZINE ER 1000 MG PO TB12: 1000.0000 mg | ORAL_TABLET | Freq: Two times a day (BID) | ORAL | Status: DC

## 2014-10-29 NOTE — Progress Notes (Signed)
Patient ID: PERNELL DIKES, female   DOB: 01-24-1941, 73 y.o.   MRN: [id number]     HPI: CRAIG WISNEWSKI is a 73 y.o. female who presents to the office today for  follow up cardiology evaluation following her attempted PCI to a severely calcified RCA.  His Bittinger is a 73 year old female who had undergone catheterization by Dr. Velva Harman with class IV symptoms on 10/16/2014.  She was found to have diffusely diseased LAD, which was not suitable for revascularization.  She had ruled in for non-ST segment elevation myocardial infarction, which was felt to be due to a severely calcified almost subtotal RCA with severe diffuse disease.  Calcified disease.  Initial attempt at PCI by Dr. Velva Harman was unsuccessful due to the severe calcification.  She was brought back to the laboratory the following day, and I performed a very long attempt at high-speed rotational atherectomy.  Unfortunately, due to the severely stenosed calcified lesion above the acute margin.  The Roto floppy wire can never be advanced distal enough due to severe disease beyond this site to allow the burn to be inserted.  Multiple attempts were made to utilize wire transfer, but even the smallest catheter was never able to pass the stenosis to allow for the Roto floppy wire to be reinserted in exchange for a field her ex T wire, which was able to cross the stenosis and bands distally.  Consequently, the procedure was aborted.  She was sent home on increased medication regimen with potential plans for possible follow-up evaluation depending upon symptom status.  Since hospital discharge, she has not experienced any significant recurrent chest pain.  During the hospitalization ranolazine was added initially at 500 mg twice a day.  I'll is isosorbide mononitrate, amlodipine, and beta blocker therapy.  Is on aspirin and Brilinta for antiplatelet therapy.   She has a history of asthma and is on Symbicort 160-4.5and Singulair.  She also has a history of  hyperlipidemia, currently on Lipitor 80 mg.  She does have GERD for which he takes Pepcid as well as omeprazole.  Past Medical History  Diagnosis Date  . Benign positional vertigo   . Osteopenia     dexa 08/22/07 AP spine + 1.1, left femur -1.3, right femur -.8; dexa 10/06/09 +1.6, left femur =1.6, right femur -.  . Hypertension   . Hyperlipidemia     <130 ldl pos fm hx, bp  . Obesity   . Asthma     xolair s 8/05 ?11/07; mastered hfa 12/20/08  . Allergic rhinitis   . Ruptured disc, cervical   . Depression   . PONV (postoperative nausea and vomiting)   . CAD (coronary artery disease)     a. 09/2014 NSTEMI s/p LHC with sig 2V dz. dLAD diffusely diseased and not suitable for PCI. unsuccessful RCA PCI d/t heavy calcifications    Past Surgical History  Procedure Laterality Date  . Cervical disc surgery  12/01  . Vein ligation and stripping    . Vulva /perineum biopsy  12-30-10    --epidermoid cyst  . Breast surgery  10-26-10    Rt. breast bx--for microcalcifications in rt. retroareolar region--dx was hyalinized fibroadenoma  . Bunionectomy Bilateral     Allergies  Allergen Reactions  . Codeine Phosphate Nausea Only  . Aspirin Swelling and Rash  . Diphenhydramine Hcl Rash  . Fish-Derived Products Swelling and Rash  . Penicillins Swelling and Rash    Current Outpatient Prescriptions  Medication Sig Dispense Refill  . albuterol (  PROAIR HFA) 108 (90 BASE) MCG/ACT inhaler Inhale 1-2 puffs into the lungs every 4 (four) hours as needed for wheezing.     Marland Kitchen amLODipine (NORVASC) 10 MG tablet take 1 tablet by mouth once daily 90 tablet 3  . atorvastatin (LIPITOR) 80 MG tablet Take 1 tablet (80 mg total) by mouth daily at 6 PM. 60 tablet 0  . beclomethasone (QVAR) 80 MCG/ACT inhaler Inhale 2 puffs into the lungs 2 (two) times daily.     . budesonide-formoterol (SYMBICORT) 160-4.5 MCG/ACT inhaler Inhale 2 puffs into the lungs daily.     . Calcium Carbonate-Vit D-Min 1200-1000 MG-UNIT CHEW  Chew 1 tablet by mouth daily.     . cetirizine (ZYRTEC) 10 MG tablet Take 10 mg by mouth at bedtime as needed for allergies.     . clotrimazole-betamethasone (LOTRISONE) cream Apply 1 application topically 2 (two) times daily as needed (itching).     Marland Kitchen dextromethorphan-guaiFENesin (MUCINEX DM) 30-600 MG per 12 hr tablet Take 1-2 tablets by mouth every 12 (twelve) hours as needed (cough).     . famotidine (PEPCID) 20 MG tablet Take 20 mg by mouth at bedtime.     . isosorbide mononitrate (IMDUR) 60 MG 24 hr tablet Take 1 tablet (60 mg total) by mouth daily. 60 tablet 0  . meclizine (ANTIVERT) 50 MG tablet Take 25-50 mg by mouth 3 (three) times daily as needed for dizziness.     . metoprolol tartrate (LOPRESSOR) 25 MG tablet Take 1 tablet (25 mg total) by mouth 2 (two) times daily. 120 tablet 0  . mometasone (NASONEX) 50 MCG/ACT nasal spray Place 1-2 sprays into the nose 2 (two) times daily as needed (allergies).     . montelukast (SINGULAIR) 10 MG tablet Take 10 mg by mouth at bedtime.    . Multiple Vitamin (MULTIVITAMIN) capsule Take 1 capsule by mouth daily.      . nitroGLYCERIN (NITROSTAT) 0.4 MG SL tablet Place 1 tablet (0.4 mg total) under the tongue every 5 (five) minutes as needed for chest pain. 30 tablet 0  . omeprazole (PRILOSEC) 20 MG capsule Take 20 mg by mouth daily.     Marland Kitchen oxymetazoline (AFRIN) 0.05 % nasal spray Place 2 sprays into both nostrils every 12 (twelve) hours as needed for congestion.     . ranolazine (RANEXA) 500 MG 12 hr tablet Take 1 tablet (500 mg total) by mouth 2 (two) times daily. 120 tablet 0  . sodium chloride (OCEAN) 0.65 % nasal spray Place 1 spray into the nose every 4 (four) hours as needed for congestion.     . ticagrelor (BRILINTA) 90 MG TABS tablet Take 1 tablet (90 mg total) by mouth 2 (two) times daily. 120 tablet 0  . traMADol (ULTRAM) 50 MG tablet take 1 to 2 tablets by mouth every 6 to 8 hours if needed for pain 60 tablet 0   No current  facility-administered medications for this visit.    History   Social History  . Marital Status: Married    Spouse Name: N/A    Number of Children: N/A  . Years of Education: N/A   Occupational History  . retired Tour manager    Social History Main Topics  . Smoking status: Never Smoker   . Smokeless tobacco: Never Used  . Alcohol Use: No  . Drug Use: No  . Sexual Activity: No   Other Topics Concern  . Not on file   Social History Narrative    Family History  Problem Relation Age of Onset  . Diabetes Mother     AODM  . Stroke Mother   . Hypertension Mother   . Heart disease Father   . Breast cancer Daughter 36    dec--mets to liver/spine  . Diabetes Sister     AODM  . Breast cancer Sister   . Hypertension Sister     ROS General: Negative; No fevers, chills, or night sweats HEENT: Negative; No changes in vision or hearing, sinus congestion, difficulty swallowing Pulmonary: positive for asthma; No cough, wheezing, shortness of breath, hemoptysis Cardiovascular: See HPI:  GI: Negative; No nausea, vomiting, diarrhea, or abdominal pain GU: Negative; No dysuria, hematuria, or difficulty voiding Musculoskeletal: Negative; no myalgias, joint pain, or weakness Hematologic: Negative; no easy bruising, bleeding Endocrine: Negative; no heat/cold intolerance; no diabetes, Neuro: Negative; no changes in balance, headaches Skin: Negative; No rashes or skin lesions Psychiatric: Negative; No behavioral problems, depression Sleep: Negative; No snoring,  daytime sleepiness, hypersomnolence, bruxism, restless legs, hypnogognic hallucinations. Other comprehensive 14 point system review is negative   Physical Exam BP 110/70 mmHg  Pulse 88  Ht 4\' 11"  (1.499 m)  Wt 128 lb 3.2 oz (58.151 kg)  BMI 25.88 kg/m2  LMP 12/21/1999 General: Alert, oriented, no distress.  Skin: normal turgor, no rashes, warm and dry HEENT: Normocephalic, atraumatic. Pupils equal round and  reactive to light; sclera anicteric; extraocular muscles intact, No lid lag; Nose without nasal septal hypertrophy; Mouth/Parynx benign; Mallinpatti scale 3 Neck: No JVD, no carotid bruits; normal carotid upstroke Lungs: clear to ausculatation and percussion bilaterally; no wheezing or rales, normal inspiratory and expiratory effort Chest wall: without tenderness to palpitation Heart: PMI not displaced, RRR, s1 s2 normal, 1/6 systolic murmur, No diastolic murmur, no rubs, gallops, thrills, or heaves Abdomen: soft, nontender; no hepatosplenomehaly, BS+; abdominal aorta nontender and not dilated by palpation. Back: no CVA tenderness Pulses: 2+; catheterization site well-healed Musculoskeletal: full range of motion, normal strength, no joint deformities Extremities: Pulses 2+, no clubbing cyanosis or edema, Homan's sign negative  Neurologic: grossly nonfocal; Cranial nerves grossly wnl Psychologic: Normal mood and affect   ECG (independently read by me):sinus rhythm at 88 bpm.  QTc interval 454 ms.  No significant ST-T changes.  LABS:  BMET    Component Value Date/Time   NA 140 10/24/2014 1027   K 4.4 10/24/2014 1027   CL 102 10/24/2014 1027   CO2 24 10/24/2014 1027   GLUCOSE 89 10/24/2014 1027   GLUCOSE 109* 10/05/2006 1022   BUN 9 10/24/2014 1027   CREATININE 0.64 10/24/2014 1027   CREATININE 0.62 10/19/2014 0518   CALCIUM 9.7 10/24/2014 1027   GFRNONAA 88* 10/19/2014 0518   GFRAA >90 10/19/2014 0518     Hepatic Function Panel     Component Value Date/Time   PROT 7.0 10/24/2014 1027   ALBUMIN 3.9 10/24/2014 1027   AST 18 10/24/2014 1027   ALT 20 10/24/2014 1027   ALKPHOS 63 10/24/2014 1027   BILITOT 0.6 10/24/2014 1027   BILIDIR 0.1 11/19/2013 0933     CBC    Component Value Date/Time   WBC 8.2 10/19/2014 0518   RBC 4.04 10/19/2014 0518   HGB 11.8* 10/19/2014 0518   HCT 35.1* 10/19/2014 0518   PLT 335 10/19/2014 0518   MCV 86.9 10/19/2014 0518   MCH 29.2  10/19/2014 0518   MCHC 33.6 10/19/2014 0518   RDW 14.3 10/19/2014 0518   LYMPHSABS 1.3 10/15/2014 0833   MONOABS 0.5 10/15/2014 7353  EOSABS 0.1 10/15/2014 0833   BASOSABS 0.0 10/15/2014 0833     BNP    Component Value Date/Time   PROBNP 8.0 10/05/2006 1022    Lipid Panel     Component Value Date/Time   CHOL 176 11/19/2013 0933   TRIG 97.0 11/19/2013 0933   HDL 47.30 11/19/2013 0933   CHOLHDL 4 11/19/2013 0933   VLDL 19.4 11/19/2013 0933   LDLCALC 109* 11/19/2013 0933     RADIOLOGY: Dg Chest Port 1 View  10/15/2014   CLINICAL DATA:  One day history of chest pain; history of asthma and hypertension  EXAM: PORTABLE CHEST - 1 VIEW  COMPARISON:  November 19, 2013  FINDINGS: There is a small area of infiltrate in the right base region. Elsewhere lungs are clear. Heart size and pulmonary vascularity are normal. No pneumothorax. No adenopathy. There is postoperative change in the lower cervical spine region.  IMPRESSION: Infiltrate right base.   Electronically Signed   By: Lowella Grip M.D.   On: 10/15/2014 08:52      ASSESSMENT AND PLAN: Mrs. Dulcemaria Bula is a 73 year old African-American female who is status post a recent non-ST segment elevation myocardial infarction, which was felt to be due to subtotal high-grade calcified RCA with severe diffuse distal disease beyond her subtotal stenosis.  She also has a severely diffusely diseased mid distal LAD which is not amenable for revascularization.  At the time of her intervention, the smallest balloon catheter that was available was a 1.20 and this was unable to cross the stenosis.  I understand now the catheterization laboratory has the switch catheter which can go down to 0.75 mm, and if the patient develops increasing recurrent symptomatology another attempt at repeat intervention may be undertaken.  However, in the interim she has done well on her increased medical therapy.  I will now.  Further titrate her ranolazine 1000  mg twice a day.  We'll also further titrate her Lopressor to 37.5 mg twice a day.  I did review recent laboratory that was done on 10/15/2014.  Creatinine remained normal at 0.62 following her contrast.  She was mildly anemic with hemoglobin 11.8, hematocrit 35.1. I will see her back in the office in 6-8 weeks for follow-up evaluation.    Further recommendations will be made at that time.  Time spent: 25 minutes   Troy Sine, MD, [facility]  10/29/2014 10:50 AM

## 2014-10-29 NOTE — Patient Instructions (Signed)
Your physician has recommended you make the following change in your medication: increase the Ranexa to 1000 mg twice daily. Increase the metoprolol tart to 37.5 mg (1 & 1/2 tablet) twice daily. The new prescriptions has been sent to your pharmacy.  Your physician recommends that you schedule a follow-up appointment in: 6-8 weeks with Dr. Claiborne Billings.

## 2014-10-30 ENCOUNTER — Ambulatory Visit (INDEPENDENT_AMBULATORY_CARE_PROVIDER_SITE_OTHER): Payer: Medicare Other | Admitting: Internal Medicine

## 2014-10-30 ENCOUNTER — Encounter: Payer: Self-pay | Admitting: Internal Medicine

## 2014-10-30 VITALS — BP 118/70 | HR 76 | Ht 59.0 in | Wt 130.4 lb

## 2014-10-30 DIAGNOSIS — I2510 Atherosclerotic heart disease of native coronary artery without angina pectoris: Secondary | ICD-10-CM

## 2014-10-30 DIAGNOSIS — J4540 Moderate persistent asthma, uncomplicated: Secondary | ICD-10-CM

## 2014-10-30 DIAGNOSIS — I10 Essential (primary) hypertension: Secondary | ICD-10-CM

## 2014-10-30 DIAGNOSIS — E785 Hyperlipidemia, unspecified: Secondary | ICD-10-CM

## 2014-10-30 NOTE — Progress Notes (Signed)
Subjective:    Patient ID: Cassie Campbell, female    DOB: 02-11-1941    MRN: [id number]   Brief patient profile:  64 yobf never smoker with severe chronic asthma and previous history to suggest marked atopy with IgE level in excess of 10,000 c/w abpa  completed xolair 10/2006. Follow in pulmonary clinic also for primary care with hbp/ hyperlipidemia complicated by I 33/82/50      History of Present Illness   07/31/2013 f/u ov/Cassie Campbell re asthma/hbp/ER f/u Chief Complaint  Patient presents with  . Follow-up    pt reports having some SOB x3 weeks after having cold like sx- noticed more in the AM-- states she increased symbicort to 2 puffs BID which has provided some relief    not needing any prns from action plan part of med calendar including saba or afrin rec if dizziness recurs, take antivert (meclizine) as per your action plan at bottom of calendar.  10/14/13 [name] > laser for back rad legs > resolved   11/19/2013 f/u ov/Cassie Campbell re:  No chief complaint on file. A) Asthma with rml atx, rarely needs to use symbicort more than once daily on qvar 80 2bid  B) HBP C) Hyperlipidemia D) chronic  Rhinitis E) Bilateral thigh pain tramadol once day mostly in am's  F) BPV, maybe once a month needs antivert >>no changes -labs ok s for low K , IgE 296  Eosinophils ok on diff , LDL at goal    02/18/2014 Follow up Asthma , HTN and Hyperlipidemia  Returns for 3 month follow up .  Pt reports she is doing well since last ov.   No new complaints; no refills needed.  Patient had lab work done at last visit. That showed LDL at goal on statin therapy Her IgE remained elevated at 296 .  rec Continue on current regimen-keep up good work  Low fat cholesterol diet  Low salt diet  Continue with exercise as tolerated.   04/16/2014 acute  ov/Cassie Campbell re: ? vertigo Chief Complaint  Patient presents with  . Acute Visit    Pt c/o vertigo x 3 days. Pt states she had several episodes of N/V/D just on saturday.  Denies SOB and CP.   acute onset 04/13/14   30 min p tilting back head in beauty shop acute vertigo/n and v then d better on antivert with bilateral temporal ha/ better with aleve. Overall 90% improved since then p rx with antivert but feels weak and no appetite, mild HA bitermporal worse as day goes on, better with aleve (not on med list). Using more proair than usual and did not increase symbicort to 160 2bid as prev rec and on med calendar as action plan rec For headache ok to try tramadol 50 mg every 4 hours as needed and call if not better in a week For any flare of wheeze or need for proair immediately increase the symbicort to 160 Take 2 puffs first thing in am and then another 2 puffs about 12 hours later until better for at least 5 days then taper back to just one puff each am Follow med cal  Admit Date: 10/15/2014 Discharge date: 10/19/2014    PRIMARY DISCHARGE DIAGNOSIS:  NSTEMI (non-ST elevated myocardial infarction)   Essential hypertension  GERD (gastroesophageal reflux disease)  Chest pain  Hyperlipidemia  Hypertension  CAD (coronary artery disease)  10/30/2014 post hosp f/u ov/Cassie Campbell re: asthma in pt with now IHD Chief Complaint  Patient presents with  . HFU  Pt states that she is doing well since hospital d/c and denies any new co's today.   all med changes per cards / Dr Claiborne Billings   Not limited by breathing from desired activities  / housework ok/ walking 30 min 3 x times mall stops at 15 x 5 due to sob/ not cp     No obvious day to day or daytime variabilty or assoc chronic cough or cp or chest tightness, subjective wheeze overt sinus or hb symptoms. No unusual exp hx or h/o childhood pna/ asthma or knowledge of premature birth.  Sleeping ok without nocturnal  or early am exacerbation  of respiratory  c/o's or need for noct saba. Also denies any obvious fluctuation of symptoms with weather or environmental changes or other aggravating or alleviating factors except  as outlined above   Current Medications, Allergies, Complete Past Medical History, Past Surgical History, Family History, and Social History were reviewed in [employer] record.  ROS  The following are not active complaints unless bolded sore throat, dysphagia, dental problems, itching, sneezing,  nasal congestion or excess/ purulent secretions, ear ache,   fever, chills, sweats, unintended wt loss, pleuritic or exertional cp, hemoptysis,  orthopnea pnd or leg swelling, presyncope, palpitations, heartburn, abdominal pain, anorexia, nausea, vomiting, diarrhea  or change in bowel or urinary habits, change in stools or urine, dysuria,hematuria,  rash, arthralgias, visual complaints, headache, numbness weakness or ataxia or problems with walking or coordination,  change in mood/affect or memory.               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 Lindsay...........................................................................Marland KitchenWert  - Td 09/2005  - Pneumovax 09/2003 and October 27, 2009  Prevnar 05/07/2014  - Colonscopy 10/28/2004  - CPX 11/19/13     - GYN per [name]'s office -Mammogram 2013 , 2014  MED CALENDAR 05/05/11 , 05/16/2013 , 08/06/2014         Objective:   Physical Exam  Wt 143 11/09/2011 >  04/12/2012  140 > 06/26/2012  139 >  11/13/2012 135 >  137 02/12/2013 >141 05/16/2013 > 07/31/2013  139  >  11/19/2013  134 >135 02/18/2014 > 04/16/2014 134 > 05/07/2014  134 >131 08/06/2014 > 10/30/2014 130   Pleasant amb bf nad  HEENT: top dentures, nl turbinates, and orophanx. Nl external ear canals without cough reflex   NECK :  without JVD/Nodes/TM/ nl carotid upstrokes bilaterally   LUNGS: no acc muscle use,no significant wheeze     CV:  RRR  no s3 or murmur or increase in P2, no edema   ABD:  soft and nontender with nl excursion in the supine position. No bruits or organomegaly, bowel sounds nl  MS:  warm without deformities, calf tenderness, cyanosis or clubbing  SKIN: warm and dry without lesions    NEURO:  alert, approp, no deficits on motor/cerbellar testing, nl reflexes/ no nystagmus  Assessment & Plan:

## 2014-10-30 NOTE — Patient Instructions (Signed)
See calendar for specific medication instructions and bring it back for each and every office visit for every healthcare provider you see.  Without it,  you may not receive the best quality medical care that we feel you deserve.  You will note that the calendar groups together  your maintenance  medications that are timed at particular times of the day.  Think of this as your checklist for what your doctor has instructed you to do until your next evaluation to see what benefit  there is  to staying on a consistent group of medications intended to keep you well.  The other group at the bottom is entirely up to you to use as you see fit  for specific symptoms that may arise between visits that require you to treat them on an as needed basis.  Think of this as your action plan or "what if" list.   Separating the top medications from the bottom group is fundamental to providing you adequate care going forward.   See Tammy NP in 3 months  with your pillboxes and all your medications, even over the counter meds, separated in two separate bags, the ones you take no matter what vs the ones you stop once you feel better and take only as needed when you feel you need them.   Tammy  will generate for you a new user friendly medication calendar that will put [place] all on the same page re: your medication use.

## 2014-10-31 ENCOUNTER — Telehealth (HOSPITAL_COMMUNITY): Payer: Self-pay | Admitting: *Deleted

## 2014-10-31 NOTE — Assessment & Plan Note (Signed)
HD > target LDL < 70   F/u by Dr Claiborne Billings on increase in lipitor tolerated so far

## 2014-10-31 NOTE — Assessment & Plan Note (Signed)
Adequate control on present rx, reviewed > no change in rx needed   

## 2014-10-31 NOTE — Assessment & Plan Note (Signed)
(  [name] not active)  -Xolair rx  8/05 > 10/2006 -Mastered HFA December 30, 2008  - IgE  296 11/20/2013  - Prevnar rx 05/07/14   All goals of chronic asthma control met including optimal function and elimination of symptoms with minimal need for rescue therapy.  Contingencies discussed in full including contacting this office immediately if not controlling the symptoms using the rule of two's.

## 2014-11-05 ENCOUNTER — Encounter (HOSPITAL_COMMUNITY): Payer: Self-pay | Admitting: Emergency Medicine

## 2014-11-05 ENCOUNTER — Inpatient Hospital Stay (HOSPITAL_COMMUNITY)
Admission: EM | Admit: 2014-11-05 | Discharge: 2014-11-06 | DRG: 195 | Disposition: A | Discharge status: Home / Self Care | Payer: Medicare Other | Attending: Internal Medicine | Admitting: Internal Medicine

## 2014-11-05 ENCOUNTER — Emergency Department (HOSPITAL_COMMUNITY): Payer: Medicare Other

## 2014-11-05 DIAGNOSIS — R0602 Shortness of breath: Secondary | ICD-10-CM

## 2014-11-05 DIAGNOSIS — I10 Essential (primary) hypertension: Secondary | ICD-10-CM | POA: Diagnosis present

## 2014-11-05 DIAGNOSIS — I2510 Atherosclerotic heart disease of native coronary artery without angina pectoris: Secondary | ICD-10-CM | POA: Diagnosis present

## 2014-11-05 DIAGNOSIS — E785 Hyperlipidemia, unspecified: Secondary | ICD-10-CM | POA: Diagnosis present

## 2014-11-05 DIAGNOSIS — M8580 Other specified disorders of bone density and structure, unspecified site: Secondary | ICD-10-CM | POA: Diagnosis present

## 2014-11-05 DIAGNOSIS — E876 Hypokalemia: Secondary | ICD-10-CM | POA: Diagnosis present

## 2014-11-05 DIAGNOSIS — I252 Old myocardial infarction: Secondary | ICD-10-CM | POA: Diagnosis not present

## 2014-11-05 DIAGNOSIS — M25559 Pain in unspecified hip: Secondary | ICD-10-CM | POA: Diagnosis present

## 2014-11-05 DIAGNOSIS — Z79899 Other long term (current) drug therapy: Secondary | ICD-10-CM

## 2014-11-05 DIAGNOSIS — F329 Major depressive disorder, single episode, unspecified: Secondary | ICD-10-CM | POA: Diagnosis present

## 2014-11-05 DIAGNOSIS — J189 Pneumonia, unspecified organism: Principal | ICD-10-CM

## 2014-11-05 DIAGNOSIS — J45909 Unspecified asthma, uncomplicated: Secondary | ICD-10-CM | POA: Diagnosis present

## 2014-11-05 DIAGNOSIS — Y95 Nosocomial condition: Secondary | ICD-10-CM | POA: Diagnosis present

## 2014-11-05 DIAGNOSIS — J4540 Moderate persistent asthma, uncomplicated: Secondary | ICD-10-CM | POA: Diagnosis present

## 2014-11-05 DIAGNOSIS — R0789 Other chest pain: Secondary | ICD-10-CM | POA: Diagnosis present

## 2014-11-05 LAB — COMPREHENSIVE METABOLIC PANEL
ALT: 49 U/L — ABNORMAL HIGH (ref 0–35)
ANION GAP: 16 — AB (ref 5–15)
AST: 41 U/L — ABNORMAL HIGH (ref 0–37)
Albumin: 3.8 g/dL (ref 3.5–5.2)
Alkaline Phosphatase: 70 U/L (ref 39–117)
BILIRUBIN TOTAL: 0.5 mg/dL (ref 0.3–1.2)
BUN: 8 mg/dL (ref 6–23)
CHLORIDE: 100 meq/L (ref 96–112)
CO2: 23 mEq/L (ref 19–32)
Calcium: 9.6 mg/dL (ref 8.4–10.5)
Creatinine, Ser: 0.56 mg/dL (ref 0.50–1.10)
GFR calc non Af Amer: >90 mL/min (ref >90)
GLUCOSE: 116 mg/dL — AB (ref 70–99)
POTASSIUM: 3.3 meq/L — AB (ref 3.7–5.3)
Sodium: 139 mEq/L (ref 137–147)
Total Protein: 7.7 g/dL (ref 6.0–8.3)

## 2014-11-05 LAB — CBC
HCT: 41.3 % (ref 36.0–46.0)
Hemoglobin: 14.2 g/dL (ref 12.0–15.0)
MCH: 30.6 pg (ref 26.0–34.0)
MCHC: 34.4 g/dL (ref 30.0–36.0)
MCV: 89 fL (ref 78.0–100.0)
Platelets: 551 10*3/uL — ABNORMAL HIGH (ref 150–400)
RBC: 4.64 MIL/uL (ref 3.87–5.11)
RDW: 15.2 % (ref 11.5–15.5)
WBC: 9 10*3/uL (ref 4.0–10.5)

## 2014-11-05 LAB — CBC WITH DIFFERENTIAL/PLATELET
Basophils Absolute: 0 10*3/uL (ref 0.0–0.1)
Basophils Relative: 0 % (ref 0–1)
Eosinophils Absolute: 0.2 10*3/uL (ref 0.0–0.7)
Eosinophils Relative: 2 % (ref 0–5)
HEMATOCRIT: 38.5 % (ref 36.0–46.0)
HEMOGLOBIN: 13.2 g/dL (ref 12.0–15.0)
LYMPHS ABS: 1.1 10*3/uL (ref 0.7–4.0)
Lymphocytes Relative: 16 % (ref 12–46)
MCH: 30.5 pg (ref 26.0–34.0)
MCHC: 34.3 g/dL (ref 30.0–36.0)
MCV: 88.9 fL (ref 78.0–100.0)
MONOS PCT: 6 % (ref 3–12)
Monocytes Absolute: 0.4 10*3/uL (ref 0.1–1.0)
NEUTROS ABS: 5.1 10*3/uL (ref 1.7–7.7)
NEUTROS PCT: 76 % (ref 43–77)
Platelets: 541 10*3/uL — ABNORMAL HIGH (ref 150–400)
RBC: 4.33 MIL/uL (ref 3.87–5.11)
RDW: 15 % (ref 11.5–15.5)
WBC: 6.7 10*3/uL (ref 4.0–10.5)

## 2014-11-05 LAB — TROPONIN I: Troponin I: <0.3 ng/mL (ref <0.30)

## 2014-11-05 LAB — CREATININE, SERUM
CREATININE: 0.54 mg/dL (ref 0.50–1.10)
GFR calc Af Amer: >90 mL/min (ref >90)
GFR calc non Af Amer: >90 mL/min (ref >90)

## 2014-11-05 LAB — I-STAT CG4 LACTIC ACID, ED: Lactic Acid, Venous: 1.58 mmol/L (ref 0.5–2.2)

## 2014-11-05 LAB — I-STAT TROPONIN, ED: Troponin i, poc: 0.01 ng/mL (ref 0.00–0.08)

## 2014-11-05 MED ORDER — VANCOMYCIN HCL IN DEXTROSE 1-5 GM/200ML-% IV SOLN
1000.0000 mg | Freq: Once | INTRAVENOUS | Status: AC
  Administered 2014-11-05: 1000 mg via INTRAVENOUS
  Filled 2014-11-05: qty 200

## 2014-11-05 MED ORDER — ISOSORBIDE MONONITRATE ER 60 MG PO TB24
60.0000 mg | ORAL_TABLET | Freq: Every day | ORAL | Status: DC
  Administered 2014-11-05: 60 mg via ORAL
  Filled 2014-11-05: qty 1

## 2014-11-05 MED ORDER — NITROGLYCERIN 0.4 MG SL SUBL: 0.4000 mg | SUBLINGUAL_TABLET | SUBLINGUAL | Status: DC | PRN

## 2014-11-05 MED ORDER — OXYMETAZOLINE HCL 0.05 % NA SOLN
2.0000 | Freq: Two times a day (BID) | NASAL | Status: DC | PRN
  Administered 2014-11-05: 2 via NASAL
  Filled 2014-11-05: qty 15

## 2014-11-05 MED ORDER — TRAMADOL HCL 50 MG PO TABS
100.0000 mg | ORAL_TABLET | Freq: Four times a day (QID) | ORAL | Status: DC | PRN
  Administered 2014-11-05 – 2014-11-06 (×2): 100 mg via ORAL
  Filled 2014-11-05 (×2): qty 2

## 2014-11-05 MED ORDER — TICAGRELOR 90 MG PO TABS
90.0000 mg | ORAL_TABLET | Freq: Two times a day (BID) | ORAL | Status: DC
  Administered 2014-11-05 (×2): 90 mg via ORAL
  Filled 2014-11-05 (×2): qty 1

## 2014-11-05 MED ORDER — DEXTROSE 5 % IV SOLN
1.0000 g | Freq: Two times a day (BID) | INTRAVENOUS | Status: DC
  Administered 2014-11-05: 1 g via INTRAVENOUS
  Filled 2014-11-05 (×2): qty 1

## 2014-11-05 MED ORDER — FAMOTIDINE 20 MG PO TABS
20.0000 mg | ORAL_TABLET | Freq: Every day | ORAL | Status: DC
  Administered 2014-11-05: 20 mg via ORAL
  Filled 2014-11-05: qty 1

## 2014-11-05 MED ORDER — SODIUM CHLORIDE 0.9 % IV SOLN
INTRAVENOUS | Status: AC
  Administered 2014-11-05: 15:00:00 via INTRAVENOUS

## 2014-11-05 MED ORDER — ATORVASTATIN CALCIUM 80 MG PO TABS
80.0000 mg | ORAL_TABLET | Freq: Every day | ORAL | Status: DC
  Administered 2014-11-05: 80 mg via ORAL
  Filled 2014-11-05: qty 1

## 2014-11-05 MED ORDER — HEPARIN SODIUM (PORCINE) 5000 UNIT/ML IJ SOLN
5000.0000 [IU] | Freq: Three times a day (TID) | INTRAMUSCULAR | Status: DC
  Administered 2014-11-05 – 2014-11-06 (×3): 5000 [IU] via SUBCUTANEOUS
  Filled 2014-11-05 (×3): qty 1

## 2014-11-05 MED ORDER — DEXTROSE 5 % IV SOLN
1.0000 g | Freq: Three times a day (TID) | INTRAVENOUS | Status: DC
  Administered 2014-11-05 – 2014-11-06 (×2): 1 g via INTRAVENOUS
  Filled 2014-11-05 (×4): qty 1

## 2014-11-05 MED ORDER — AMLODIPINE BESYLATE 10 MG PO TABS
10.0000 mg | ORAL_TABLET | Freq: Every day | ORAL | Status: DC
  Administered 2014-11-05: 10 mg via ORAL
  Filled 2014-11-05: qty 1

## 2014-11-05 MED ORDER — NITROGLYCERIN 0.4 MG SL SUBL
0.4000 mg | SUBLINGUAL_TABLET | SUBLINGUAL | Status: DC | PRN
  Administered 2014-11-05: 0.4 mg via SUBLINGUAL
  Filled 2014-11-05: qty 1

## 2014-11-05 MED ORDER — IOHEXOL 350 MG/ML SOLN
100.0000 mL | Freq: Once | INTRAVENOUS | Status: AC | PRN
  Administered 2014-11-05: 60 mL via INTRAVENOUS

## 2014-11-05 MED ORDER — VANCOMYCIN HCL 500 MG IV SOLR
500.0000 mg | Freq: Two times a day (BID) | INTRAVENOUS | Status: DC
  Administered 2014-11-05: 500 mg via INTRAVENOUS
  Filled 2014-11-05 (×4): qty 500

## 2014-11-05 MED ORDER — BUDESONIDE-FORMOTEROL FUMARATE 160-4.5 MCG/ACT IN AERO
2.0000 | INHALATION_SPRAY | Freq: Every day | RESPIRATORY_TRACT | Status: DC
  Filled 2014-11-05: qty 6

## 2014-11-05 MED ORDER — IPRATROPIUM-ALBUTEROL 0.5-2.5 (3) MG/3ML IN SOLN
3.0000 mL | Freq: Once | RESPIRATORY_TRACT | Status: AC
  Administered 2014-11-05: 3 mL via RESPIRATORY_TRACT
  Filled 2014-11-05: qty 3

## 2014-11-05 MED ORDER — BECLOMETHASONE DIPROPIONATE 80 MCG/ACT IN AERS
2.0000 | INHALATION_SPRAY | Freq: Two times a day (BID) | RESPIRATORY_TRACT | Status: DC
  Administered 2014-11-05: 2 via RESPIRATORY_TRACT
  Filled 2014-11-05: qty 8.7

## 2014-11-05 MED ORDER — PREDNISONE 20 MG PO TABS
60.0000 mg | ORAL_TABLET | Freq: Every day | ORAL | Status: DC
  Administered 2014-11-05 – 2014-11-06 (×2): 60 mg via ORAL
  Filled 2014-11-05 (×2): qty 3

## 2014-11-05 MED ORDER — RANOLAZINE ER 500 MG PO TB12
1000.0000 mg | ORAL_TABLET | Freq: Two times a day (BID) | ORAL | Status: DC
  Administered 2014-11-05 (×2): 1000 mg via ORAL
  Filled 2014-11-05 (×2): qty 2

## 2014-11-05 MED ORDER — FLUTICASONE PROPIONATE 50 MCG/ACT NA SUSP
2.0000 | Freq: Every day | NASAL | Status: DC
  Filled 2014-11-05: qty 16

## 2014-11-05 MED ORDER — MECLIZINE HCL 25 MG PO TABS: 25.0000 mg | ORAL_TABLET | Freq: Three times a day (TID) | ORAL | Status: DC | PRN

## 2014-11-05 MED ORDER — METOPROLOL TARTRATE 12.5 MG HALF TABLET
37.5000 mg | ORAL_TABLET | Freq: Two times a day (BID) | ORAL | Status: DC
  Administered 2014-11-05: 37.5 mg via ORAL
  Filled 2014-11-05 (×2): qty 1

## 2014-11-05 MED ORDER — ALBUTEROL SULFATE (2.5 MG/3ML) 0.083% IN NEBU: 3.0000 mL | INHALATION_SOLUTION | RESPIRATORY_TRACT | Status: DC | PRN

## 2014-11-05 MED ORDER — DM-GUAIFENESIN ER 30-600 MG PO TB12: 1.0000 | ORAL_TABLET | Freq: Two times a day (BID) | ORAL | Status: DC | PRN

## 2014-11-05 MED ORDER — MONTELUKAST SODIUM 10 MG PO TABS
10.0000 mg | ORAL_TABLET | Freq: Every day | ORAL | Status: DC
  Administered 2014-11-05: 10 mg via ORAL
  Filled 2014-11-05: qty 1

## 2014-11-05 NOTE — H&P (Signed)
[facility] History and Physical  Cassie Campbell OVZ:[id number] DOB: 1941-01-29 DOA: 11/05/2014  Referring physician: ED PCP: Christinia Gully, MD  Specialists: none currently  Chief Complaint: Cough  HPI:  73 y/o ? severe chronic asthma, Htn, Hld, status post cervical surgery 2000-recent admission 1/27-10/31 NSTEMI-nonamenable to PCI.  Last Echo LVEF 41-28%-[facility] diastolic dysfucntion.  Since she was discharged from hospital she's been doing fair and has had a couple of episodes of mild chest pain. She awoke on the morning of 11/17 at 3:15 AM with predominant central chest pain 8/10 relieved by nitroglycerin 1 dose. She also had some shortness of breath. Since discharge she has not had any lower extremity edema and tightness of her clothes and he PND or orthopnea. She also denies fever chills cough or sputum unilateral weakness , dark stool tarry stool or diarrhea.\ She states that her cough and her allergy diatheses has not been significantly worse than previously. She is a never smoker and never drinker finished 12th grade and used to work for a [employer] and retired in 2010. She is fully functional and can do most of her normal activities at home. She does not require a walker or cyst advised She's a G2 P2 She is up-to-date on most of her preventive care except for colonoscopy please postponed as this was around Her last admission.   Chest x-ray two-view showed pneumonia right lower lobe and CT angiogram of the chest showed right middle and lower lobe consolidation.  Potassium 3.3 LFTs within normal limits other than AST and ALT 41 range lactic acid 1.5 CBC within normal limits  Review of Systems:  Negative except as above  Past Medical History  Diagnosis Date  . Benign positional vertigo   . Osteopenia     dexa 08/22/07 AP spine + 1.1, left femur -1.3, right femur -.8; dexa 10/06/09 +1.6, left femur =1.6, right femur -.  . Hypertension   . Hyperlipidemia      <130 ldl pos fm hx, bp  . Obesity   . Asthma     xolair s 8/05 ?11/07; mastered hfa 12/20/08  . Allergic rhinitis   . Ruptured disc, cervical   . Depression   . PONV (postoperative nausea and vomiting)   . CAD (coronary artery disease)     a. 09/2014 NSTEMI s/p LHC with sig 2V dz. dLAD diffusely diseased and not suitable for PCI. unsuccessful RCA PCI d/t heavy calcifications   Past Surgical History  Procedure Laterality Date  . Cervical disc surgery  12/01  . Vein ligation and stripping    . Vulva /perineum biopsy  12-30-10    --epidermoid cyst  . Breast surgery  10-26-10    Rt. breast bx--for microcalcifications in rt. retroareolar region--dx was hyalinized fibroadenoma  . Bunionectomy Bilateral    Social History:  History   Social History Narrative    Allergies  Allergen Reactions  . Codeine Phosphate Nausea Only  . Aspirin Swelling and Rash  . Diphenhydramine Hcl Rash  . Fish-Derived Products Swelling and Rash  . Penicillins Swelling and Rash    Family History  Problem Relation Age of Onset  . Diabetes Mother     AODM  . Stroke Mother   . Hypertension Mother   . Heart disease Father   . Breast cancer Daughter 36    dec--mets to liver/spine  . Diabetes Sister     AODM  . Breast cancer Sister   . Hypertension Sister  Prior to Admission medications   Medication Sig Start Date End Date Taking? Authorizing Provider  amLODipine (NORVASC) 10 MG tablet take 1 tablet by mouth once daily 11/27/13  Yes Tanda Rockers, MD  atorvastatin (LIPITOR) 80 MG tablet Take 1 tablet (80 mg total) by mouth daily at 6 PM. 10/19/14  Yes Shanker Kristeen Mans, MD  beclomethasone (QVAR) 80 MCG/ACT inhaler Inhale 2 puffs into the lungs 2 (two) times daily.    Yes Historical Provider, MD  budesonide-formoterol (SYMBICORT) 160-4.5 MCG/ACT inhaler Inhale 2 puffs into the lungs daily.  12/04/12  Yes Tanda Rockers, MD  Calcium Carbonate-Vit D-Min 1200-1000 MG-UNIT CHEW Chew 1 tablet by mouth  daily.    Yes Historical Provider, MD  cetirizine (ZYRTEC) 10 MG tablet Take 10 mg by mouth at bedtime as needed for allergies.    Yes Historical Provider, MD  dextromethorphan-guaiFENesin (MUCINEX DM) 30-600 MG per 12 hr tablet Take 1-2 tablets by mouth every 12 (twelve) hours as needed (cough).    Yes Historical Provider, MD  famotidine (PEPCID) 20 MG tablet Take 20 mg by mouth at bedtime.    Yes Historical Provider, MD  isosorbide mononitrate (IMDUR) 60 MG 24 hr tablet Take 1 tablet (60 mg total) by mouth daily. 10/19/14  Yes Shanker Kristeen Mans, MD  metoprolol tartrate (LOPRESSOR) 25 MG tablet Take 1 & 1/2 tablet twice a day 10/29/14  Yes Troy Sine, MD  montelukast (SINGULAIR) 10 MG tablet Take 10 mg by mouth at bedtime.   Yes Historical Provider, MD  Multiple Vitamin (MULTIVITAMIN) capsule Take 1 capsule by mouth daily.     Yes Historical Provider, MD  nitroGLYCERIN (NITROSTAT) 0.4 MG SL tablet Place 1 tablet (0.4 mg total) under the tongue every 5 (five) minutes as needed for chest pain. 10/19/14  Yes Shanker Kristeen Mans, MD  omeprazole (PRILOSEC) 20 MG capsule Take 20 mg by mouth daily.    Yes Historical Provider, MD  ranolazine (RANEXA) 1000 MG SR tablet Take 1 tablet (1,000 mg total) by mouth 2 (two) times daily. 10/29/14  Yes Troy Sine, MD  ticagrelor (BRILINTA) 90 MG TABS tablet Take 1 tablet (90 mg total) by mouth 2 (two) times daily. 10/19/14  Yes Shanker Kristeen Mans, MD  albuterol (PROAIR HFA) 108 (90 BASE) MCG/ACT inhaler Inhale 1-2 puffs into the lungs every 4 (four) hours as needed for wheezing.     Historical Provider, MD  clotrimazole-betamethasone (LOTRISONE) cream Apply 1 application topically 2 (two) times daily as needed (itching).     Historical Provider, MD  meclizine (ANTIVERT) 50 MG tablet Take 25-50 mg by mouth 3 (three) times daily as needed for dizziness.  07/31/13   Tanda Rockers, MD  mometasone (NASONEX) 50 MCG/ACT nasal spray Place 1-2 sprays into the nose 2 (two)  times daily as needed (allergies).     Historical Provider, MD  oxymetazoline (AFRIN) 0.05 % nasal spray Place 2 sprays into both nostrils every 12 (twelve) hours as needed for congestion.     Historical Provider, MD  sodium chloride (OCEAN) 0.65 % nasal spray Place 1 spray into the nose every 4 (four) hours as needed for congestion.     Historical Provider, MD  traMADol (ULTRAM) 50 MG tablet take 1 to 2 tablets by mouth every 6 to 8 hours if needed for pain 09/26/14   Tanda Rockers, MD   Physical Exam: Filed Vitals:   11/05/14 0815 11/05/14 0830 11/05/14 0915 11/05/14 0930  BP: 133/78 119/80 130/68  128/68  Pulse: 86 87 77 77  Temp:      TempSrc:      Resp: 13 11 18 14   SpO2: 95% 96% 98% 98%     General:  alert pleasant oriented no apparent distress  Eyes: EOMI NCAT  ENT: no JVD no bruit  Neck: . supple  Cardiovascular: S1-S2 no murmur rub or gallop  Respiratory: clinically clear but wheezes bilaterally but this only. No TVF no TVR  Abdomen: soft nontender nondistended no rebound or guarding  Skin: no lower extremity edema  Musculoskeletal: range of motion intact  Psychiatric: euthymic pleasant  Neurologic: 5/5 power, sensory grossly intact, smile symmetric. Reflexes within normal limits  Labs on Admission:  Basic Metabolic Panel:  Recent Labs Lab 11/05/14 0732  NA 139  K 3.3*  CL 100  CO2 23  GLUCOSE 116*  BUN 8  CREATININE 0.56  CALCIUM 9.6   Liver Function Tests:  Recent Labs Lab 11/05/14 0732  AST 41*  ALT 49*  ALKPHOS 70  BILITOT 0.5  PROT 7.7  ALBUMIN 3.8   No results for input(s): LIPASE, AMYLASE in the last 168 hours. No results for input(s): AMMONIA in the last 168 hours. CBC:  Recent Labs Lab 11/05/14 0732  WBC 6.7  NEUTROABS 5.1  HGB 13.2  HCT 38.5  MCV 88.9  PLT 541*   Cardiac Enzymes: No results for input(s): CKTOTAL, CKMB, CKMBINDEX, TROPONINI in the last 168 hours.  BNP (last 3 results) No results for input(s): PROBNP  in the last 8760 hours. CBG: No results for input(s): GLUCAP in the last 168 hours.  Radiological Exams on Admission: Dg Chest 2 View  11/05/2014   CLINICAL DATA:  Chest pain and shortness of Breath.  EXAM: CHEST  2 VIEW  COMPARISON:  10/15/2014  FINDINGS: The cardiac silhouette, mediastinal and hilar contours are within normal limits. There is right lower lobe airspace consolidation and or atelectasis. This is somewhat concerning given the prior chest x-ray showing similar findings. I would recommend a followup chest CT with contrast to exclude the possibility of an obstructing lesion. The left lung is clear. The bony thorax is intact.  IMPRESSION: Persistent and or recurrent right lower lobe process is worrisome for a postobstructive abnormality. Recommend chest CT with contrast for further evaluation.   Electronically Signed   By: Kalman Jewels M.D.   On: 11/05/2014 08:11   Ct Angio Chest Pe W/cm &/or Wo Cm  11/05/2014   CLINICAL DATA:  73 year old female awoke with severe chest pain and shortness of Breath. Initial encounter. Personal history of myocardial infarction last month.  EXAM: CT ANGIOGRAPHY CHEST WITH CONTRAST  TECHNIQUE: Multidetector CT imaging of the chest was performed using the standard protocol during bolus administration of intravenous contrast. Multiplanar CT image reconstructions and MIPs were obtained to evaluate the vascular anatomy.  CONTRAST:  75mL OMNIPAQUE IOHEXOL 350 MG/ML SOLN  COMPARISON:  Chest radiographs 0746 hr the same day and earlier.  FINDINGS: Adequate contrast bolus timing in the pulmonary arterial tree. Lower lobe respiratory motion artifact.  No focal filling defect identified in the pulmonary arterial tree to suggest the presence of acute pulmonary embolism.  The trachea and carina are patent. Major airways in the left lung are patent. There is opacification of the bronchus intermedius (series 6, image 42) with what appears to be bubbly or sub solid material.  This continues distally and there is associated partial collapse or consolidation at the right lung base which appears to be  in the middle lobe posteriorly. There is additional nodular and confluent opacity in the right lower lobe, with consolidation in the costophrenic angle. Small air bronchograms or less likely tiny cavitary changes within the right middle lobe disease (series 6, image 50).  No definite hilar lymphadenopathy. No mediastinal lymphadenopathy (right peritracheal node measuring 7 mm short axis is within normal limits). No pleural or pericardial effusion.  The right upper rib lobe and left lung are clear except for dependent atelectasis.  Negative thoracic inlet. Coronary artery calcified plaque appears extensive. Negative visible aorta. No cardiomegaly.  Negative visualized liver, spleen, pancreas, adrenal glands, renal upper poles, and bowel in the upper abdomen.  No acute osseous abnormality identified. Chronic appearing and probably degenerative superior endplate deformity of T7. Lower cervical ACDF.  Review of the MIP images confirms the above findings.  IMPRESSION: 1.  No evidence of acute pulmonary embolus. 2. Right middle lobe and lower lobe consolidation / pneumonia with opacified bronchus intermedius and more distal airways. This might be due to aspiration, and difficult to exclude a early necrotizing pneumonia in the middle lobe. No hilar lymphadenopathy or obstructing mass identified. Consider further evaluation with bronchoscopy. 3. Extensive coronary artery calcified plaque.   Electronically Signed   By: Lars Pinks M.D.   On: 11/05/2014 09:34    EKG: Independently reviewed. EKG shows sinus rhythm PR interval 0.12 mild ST depression in V2 V3 but this is not really significant change from her.  Assessment/Plan Principal Problem:   HCAP (healthcare-associated pneumonia)-patient started on cefepime and vancomycin in the emergency room which we will continue. She looks surprisingly good  for someone with the degree of pneumonia that she has. I don't think that she needs speech therapy to see her at this stage. She can possibly transition to by mouth Levaquin by tomorrow morning and attention even go home by then if she does not have any recurrences. Active Problems:   Hyperlipidemia with target LDL less than 70-continue statin, atorvastatin 80 mg   Essential hypertension-continue meds as below see to discussion-continue amlodipine 10 daily   Moderate persistent asthma in adult without complication-Patient has mild wheezing bilaterally symmetric burst of steroids 60 mg for 5 days with suffice.continue Qvar 80 2 puffs twice a day, albuterol inhaler 1-2 puffs every 4 when necessary, Singulair 10 mg daily,  Disorder of bone and cartilage-hold cholecalciferol and calcium replacements for now while in hospital   Arthralgia of hip-monitor   CAD (coronary artery disease)-reasonable to cycle troponins she is just had an NSTEMI-her symptomatology is concerning for maybe microvascular ischemia but she is a known target that is not amenable to stenting so if she spikes troponin or EKG changes in anyway we will reevaluate her-continue metoprolol 37.5 twice a day, Ranexa1000 twice a day, Brilinta 90 twice a day.  We can probably increase her Imdur to 90 or 120 grams she continues to have pain   Grade 1 diastolic dysfunction. Probably notcontributing to her issues right now.   Time spent: 50 minutes Discussed with patient at bedside Full code  [place], [facility] Pager [phone]  If 7PM-7AM, please contact night-coverage [email or web address] Password [email or web address] 11/05/2014, 10:01 AM

## 2014-11-05 NOTE — ED Provider Notes (Signed)
CSN: [id number]     Arrival date & time 11/05/14  4970 History   First MD Initiated Contact with Patient 11/05/14 [phone]     Chief Complaint  Patient presents with  . Chest Pain     (Consider location/radiation/quality/duration/timing/severity/associated sxs/prior Treatment) The history is provided by the patient.  Cassie Campbell is a 73 y.o. female hx of HTN, HL, CAD with recent NSTEMI, history of presenting with chest pain and shortness of breath. Was recently admitted for NSTEMI and patient was cathed and there was stenosis but was unable to place stent due to high calcium in that area. Patient was then managed medically. Left the hospital a week ago. She saw pulmonology afterwards. She was doing well until last night when she woke up short of breath and chest pain. Shortness of breath worse with laying down. She took a nitroglycerin with minimal relief and was given second nitroglycerin by EMS. She states that now pain is 3/10. She is allergic to ASA. She has hx of asthma and gave herself a neb at home. Not on prednisone currently.    Past Medical History  Diagnosis Date  . Benign positional vertigo   . Osteopenia     dexa 08/22/07 AP spine + 1.1, left femur -1.3, right femur -.8; dexa 10/06/09 +1.6, left femur =1.6, right femur -.  . Hypertension   . Hyperlipidemia     <130 ldl pos fm hx, bp  . Obesity   . Asthma     xolair s 8/05 ?11/07; mastered hfa 12/20/08  . Allergic rhinitis   . Ruptured disc, cervical   . Depression   . PONV (postoperative nausea and vomiting)   . CAD (coronary artery disease)     a. 09/2014 NSTEMI s/p LHC with sig 2V dz. dLAD diffusely diseased and not suitable for PCI. unsuccessful RCA PCI d/t heavy calcifications   Past Surgical History  Procedure Laterality Date  . Cervical disc surgery  12/01  . Vein ligation and stripping    . Vulva /perineum biopsy  12-30-10    --epidermoid cyst  . Breast surgery  10-26-10    Rt. breast bx--for  microcalcifications in rt. retroareolar region--dx was hyalinized fibroadenoma  . Bunionectomy Bilateral    Family History  Problem Relation Age of Onset  . Diabetes Mother     AODM  . Stroke Mother   . Hypertension Mother   . Heart disease Father   . Breast cancer Daughter 36    dec--mets to liver/spine  . Diabetes Sister     AODM  . Breast cancer Sister   . Hypertension Sister    History  Substance Use Topics  . Smoking status: Never Smoker   . Smokeless tobacco: Never Used  . Alcohol Use: No   OB History    Gravida Para Term Preterm AB TAB SAB Ectopic Multiple Living   2 1 1  1  1   1      Review of Systems  Respiratory: Positive for shortness of breath.   Cardiovascular: Positive for chest pain.  All other systems reviewed and are negative.     Allergies  Codeine phosphate; Aspirin; Diphenhydramine hcl; Fish-derived products; and Penicillins  Home Medications   Prior to Admission medications   Medication Sig Start Date End Date Taking? Authorizing Provider  amLODipine (NORVASC) 10 MG tablet take 1 tablet by mouth once daily 11/27/13  Yes Tanda Rockers, MD  atorvastatin (LIPITOR) 80 MG tablet Take 1 tablet (80 mg total)  by mouth daily at 6 PM. 10/19/14  Yes Shanker Kristeen Mans, MD  beclomethasone (QVAR) 80 MCG/ACT inhaler Inhale 2 puffs into the lungs 2 (two) times daily.    Yes Historical Provider, MD  budesonide-formoterol (SYMBICORT) 160-4.5 MCG/ACT inhaler Inhale 2 puffs into the lungs daily.  12/04/12  Yes Tanda Rockers, MD  Calcium Carbonate-Vit D-Min 1200-1000 MG-UNIT CHEW Chew 1 tablet by mouth daily.    Yes Historical Provider, MD  cetirizine (ZYRTEC) 10 MG tablet Take 10 mg by mouth at bedtime as needed for allergies.    Yes Historical Provider, MD  dextromethorphan-guaiFENesin (MUCINEX DM) 30-600 MG per 12 hr tablet Take 1-2 tablets by mouth every 12 (twelve) hours as needed (cough).    Yes Historical Provider, MD  famotidine (PEPCID) 20 MG tablet Take 20  mg by mouth at bedtime.    Yes Historical Provider, MD  isosorbide mononitrate (IMDUR) 60 MG 24 hr tablet Take 1 tablet (60 mg total) by mouth daily. 10/19/14  Yes Shanker Kristeen Mans, MD  metoprolol tartrate (LOPRESSOR) 25 MG tablet Take 1 & 1/2 tablet twice a day 10/29/14  Yes Troy Sine, MD  montelukast (SINGULAIR) 10 MG tablet Take 10 mg by mouth at bedtime.   Yes Historical Provider, MD  Multiple Vitamin (MULTIVITAMIN) capsule Take 1 capsule by mouth daily.     Yes Historical Provider, MD  nitroGLYCERIN (NITROSTAT) 0.4 MG SL tablet Place 1 tablet (0.4 mg total) under the tongue every 5 (five) minutes as needed for chest pain. 10/19/14  Yes Shanker Kristeen Mans, MD  omeprazole (PRILOSEC) 20 MG capsule Take 20 mg by mouth daily.    Yes Historical Provider, MD  ranolazine (RANEXA) 1000 MG SR tablet Take 1 tablet (1,000 mg total) by mouth 2 (two) times daily. 10/29/14  Yes Troy Sine, MD  ticagrelor (BRILINTA) 90 MG TABS tablet Take 1 tablet (90 mg total) by mouth 2 (two) times daily. 10/19/14  Yes Shanker Kristeen Mans, MD  albuterol (PROAIR HFA) 108 (90 BASE) MCG/ACT inhaler Inhale 1-2 puffs into the lungs every 4 (four) hours as needed for wheezing.     Historical Provider, MD  clotrimazole-betamethasone (LOTRISONE) cream Apply 1 application topically 2 (two) times daily as needed (itching).     Historical Provider, MD  meclizine (ANTIVERT) 50 MG tablet Take 25-50 mg by mouth 3 (three) times daily as needed for dizziness.  07/31/13   Tanda Rockers, MD  mometasone (NASONEX) 50 MCG/ACT nasal spray Place 1-2 sprays into the nose 2 (two) times daily as needed (allergies).     Historical Provider, MD  oxymetazoline (AFRIN) 0.05 % nasal spray Place 2 sprays into both nostrils every 12 (twelve) hours as needed for congestion.     Historical Provider, MD  sodium chloride (OCEAN) 0.65 % nasal spray Place 1 spray into the nose every 4 (four) hours as needed for congestion.     Historical Provider, MD   traMADol (ULTRAM) 50 MG tablet take 1 to 2 tablets by mouth every 6 to 8 hours if needed for pain 09/26/14   Tanda Rockers, MD   BP 128/68 mmHg  Pulse 77  Temp(Src) 98.3 F (36.8 C) (Oral)  Resp 14  SpO2 98%  LMP 12/21/1999 Physical Exam  Constitutional: She is oriented to person, place, and time.  Chronically ill   HENT:  Head: Normocephalic.  Mouth/Throat: Oropharynx is clear and moist.  Eyes: Conjunctivae are normal. Pupils are equal, round, and reactive to light.  Neck: Normal  range of motion. Neck supple.  Cardiovascular: Normal rate, regular rhythm and normal heart sounds.   Pulmonary/Chest:  Tachypneic, mild wheezing lower lungs bilaterally. No crackles   Abdominal: Soft. Bowel sounds are normal. She exhibits no distension. There is no tenderness. There is no rebound and no guarding.  Musculoskeletal: Normal range of motion. She exhibits no edema or tenderness.  Neurological: She is alert and oriented to person, place, and time. No cranial nerve deficit. Coordination normal.  Skin: Skin is warm and dry.  Psychiatric: She has a normal mood and affect. Her behavior is normal. Judgment and thought content normal.  Nursing note and vitals reviewed.   ED Course  Procedures (including critical care time) Labs Review Labs Reviewed  CBC WITH DIFFERENTIAL - Abnormal; Notable for the following:    Platelets 541 (*)    All other components within normal limits  COMPREHENSIVE METABOLIC PANEL - Abnormal; Notable for the following:    Potassium 3.3 (*)    Glucose, Bld 116 (*)    AST 41 (*)    ALT 49 (*)    Anion gap 16 (*)    All other components within normal limits  I-STAT TROPOININ, ED  I-STAT CG4 LACTIC ACID, ED    Imaging Review Dg Chest 2 View  11/05/2014   CLINICAL DATA:  Chest pain and shortness of Breath.  EXAM: CHEST  2 VIEW  COMPARISON:  10/15/2014  FINDINGS: The cardiac silhouette, mediastinal and hilar contours are within normal limits. There is right lower  lobe airspace consolidation and or atelectasis. This is somewhat concerning given the prior chest x-ray showing similar findings. I would recommend a followup chest CT with contrast to exclude the possibility of an obstructing lesion. The left lung is clear. The bony thorax is intact.  IMPRESSION: Persistent and or recurrent right lower lobe process is worrisome for a postobstructive abnormality. Recommend chest CT with contrast for further evaluation.   Electronically Signed   By: Kalman Jewels M.D.   On: 11/05/2014 08:11   Ct Angio Chest Pe W/cm &/or Wo Cm  11/05/2014   CLINICAL DATA:  73 year old female awoke with severe chest pain and shortness of Breath. Initial encounter. Personal history of myocardial infarction last month.  EXAM: CT ANGIOGRAPHY CHEST WITH CONTRAST  TECHNIQUE: Multidetector CT imaging of the chest was performed using the standard protocol during bolus administration of intravenous contrast. Multiplanar CT image reconstructions and MIPs were obtained to evaluate the vascular anatomy.  CONTRAST:  31mL OMNIPAQUE IOHEXOL 350 MG/ML SOLN  COMPARISON:  Chest radiographs 0746 hr the same day and earlier.  FINDINGS: Adequate contrast bolus timing in the pulmonary arterial tree. Lower lobe respiratory motion artifact.  No focal filling defect identified in the pulmonary arterial tree to suggest the presence of acute pulmonary embolism.  The trachea and carina are patent. Major airways in the left lung are patent. There is opacification of the bronchus intermedius (series 6, image 42) with what appears to be bubbly or sub solid material. This continues distally and there is associated partial collapse or consolidation at the right lung base which appears to be in the middle lobe posteriorly. There is additional nodular and confluent opacity in the right lower lobe, with consolidation in the costophrenic angle. Small air bronchograms or less likely tiny cavitary changes within the right middle lobe  disease (series 6, image 50).  No definite hilar lymphadenopathy. No mediastinal lymphadenopathy (right peritracheal node measuring 7 mm short axis is within normal limits). No pleural  or pericardial effusion.  The right upper rib lobe and left lung are clear except for dependent atelectasis.  Negative thoracic inlet. Coronary artery calcified plaque appears extensive. Negative visible aorta. No cardiomegaly.  Negative visualized liver, spleen, pancreas, adrenal glands, renal upper poles, and bowel in the upper abdomen.  No acute osseous abnormality identified. Chronic appearing and probably degenerative superior endplate deformity of T7. Lower cervical ACDF.  Review of the MIP images confirms the above findings.  IMPRESSION: 1.  No evidence of acute pulmonary embolus. 2. Right middle lobe and lower lobe consolidation / pneumonia with opacified bronchus intermedius and more distal airways. This might be due to aspiration, and difficult to exclude a early necrotizing pneumonia in the middle lobe. No hilar lymphadenopathy or obstructing mass identified. Consider further evaluation with bronchoscopy. 3. Extensive coronary artery calcified plaque.   Electronically Signed   By: Lars Pinks M.D.   On: 11/05/2014 09:34     EKG Interpretation None      MDM   Final diagnoses:  Shortness of breath    KENLEY RETTINGER is a 73 y.o. female here with SOB, chest pain. Mild wheezing. Consider asthma attack vs ACS. Will get labs, CXR. Will give nebs. Will likely need readmission.   10:10 AM CXR showed RML pneumonia. CT showed no PE, + pneumonia. Given vanc/cefepime. Will admit to tele.     Wandra Arthurs, MD 11/05/14 1010

## 2014-11-05 NOTE — Progress Notes (Signed)
UR Completed Lakin Rhine Graves-Bigelow, RN,BSN [phone]  

## 2014-11-05 NOTE — ED Notes (Signed)
Patient woke this am with acute onset of chest pain and shortness of breath at 0315.  Patient used her inhaler, which relieved her shortness of breath.  Patient continues with chest pain upon EMS arrival, was given one SL nitro, pain went from 10/10 to 4/10.  Patient is CAOx3 at this time.  Patient had MI at end of Oct 2015.

## 2014-11-05 NOTE — Care Management Note (Unsigned)
    Page 1 of 1   11/05/2014     2:19:33 PM CARE MANAGEMENT NOTE 11/05/2014  Patient:  Cassie Campbell, Cassie Campbell   Account Number:  [id number]  Date Initiated:  11/05/2014  Documentation initiated by:  GRAVES-BIGELOW,Maeby Vankleeck  Subjective/Objective Assessment:   Pt admitted for cough. Treating PNA with IV abx therapy.     Action/Plan:   CM to monitor for disposition needs.   Anticipated DC Date:  11/07/2014   Anticipated DC Plan:  [place]  CM consult      Choice offered to / List presented to:             Status of service:  In process, will continue to follow Medicare Important Message given?   (If response is "NO", the following Medicare IM given date fields will be blank) Date Medicare IM given:   Medicare IM given by:   Date Additional Medicare IM given:   Additional Medicare IM given by:    Discharge Disposition:    Per UR Regulation:  Reviewed for med. necessity/level of care/duration of stay  If discussed at [place] of Stay Meetings, dates discussed:    Comments:

## 2014-11-05 NOTE — Progress Notes (Addendum)
ANTIBIOTIC CONSULT NOTE - INITIAL  Pharmacy Consult for cefepime Indication: pneumonia  Allergies  Allergen Reactions  . Codeine Phosphate Nausea Only  . Aspirin Swelling and Rash  . Diphenhydramine Hcl Rash  . Fish-Derived Products Swelling and Rash  . Penicillins Swelling and Rash    Patient Measurements:    Body Weight: 59.1 kg   Vital Signs: Temp: 98.3 F (36.8 C) (11/17 0655) Temp Source: Oral (11/17 0655) BP: 128/68 mmHg (11/17 0930) Pulse Rate: 77 (11/17 0930) Intake/Output from previous day:   Intake/Output from this shift:    Labs:  Recent Labs  11/05/14 0732  WBC 6.7  HGB 13.2  PLT 541*  CREATININE 0.56   Estimated Creatinine Clearance: 49.8 mL/min (by C-G formula based on Cr of 0.56). No results for input(s): VANCOTROUGH, VANCOPEAK, VANCORANDOM, GENTTROUGH, GENTPEAK, GENTRANDOM, TOBRATROUGH, TOBRAPEAK, TOBRARND, AMIKACINPEAK, AMIKACINTROU, AMIKACIN in the last 72 hours.   Microbiology: No results found for this or any previous visit (from the past 720 hour(s)).  Medical History: Past Medical History  Diagnosis Date  . Benign positional vertigo   . Osteopenia     dexa 08/22/07 AP spine + 1.1, left femur -1.3, right femur -.8; dexa 10/06/09 +1.6, left femur =1.6, right femur -.  . Hypertension   . Hyperlipidemia     <130 ldl pos fm hx, bp  . Obesity   . Asthma     xolair s 8/05 ?11/07; mastered hfa 12/20/08  . Allergic rhinitis   . Ruptured disc, cervical   . Depression   . PONV (postoperative nausea and vomiting)   . CAD (coronary artery disease)     a. 09/2014 NSTEMI s/p LHC with sig 2V dz. dLAD diffusely diseased and not suitable for PCI. unsuccessful RCA PCI d/t heavy calcifications    Medications:  See medication history Assessment: 73 yo lady to start broad spectrum antibiotics for HCAP.  She is afebrile and WBC normal.  Her CT chest showed RML and RLL consolidation/PNA. Her CrCl ~50 ml/min  Goal of Therapy:  Eradication of  infection  Plan:  Cefepime 1gm IV q12 hours F/u cultures, clinical course and renal function  Thanks for allowing pharmacy to be a part of this patient's care.  Excell Seltzer, PharmD Clinical Pharmacist, [phone] 11/05/2014,9:40 AM   Addendum -Pt received vancomycin 1 g IV x1 in ED, will resume 500 mg IV q12h  -Monitor cultures, duration of therapy   Hughes Better, PharmD, BCPS Clinical Pharmacist Pager: [phone] 11/05/2014 1:26 PM

## 2014-11-05 NOTE — ED Notes (Signed)
Pt leaving for x-ray.  ?

## 2014-11-05 NOTE — Plan of Care (Signed)
Problem: Phase I Progression Outcomes Goal: Pain controlled with appropriate interventions Outcome: Completed/Met Date Met:  11/05/14

## 2014-11-06 DIAGNOSIS — R0789 Other chest pain: Secondary | ICD-10-CM | POA: Diagnosis present

## 2014-11-06 DIAGNOSIS — J4540 Moderate persistent asthma, uncomplicated: Secondary | ICD-10-CM

## 2014-11-06 LAB — HIV ANTIBODY (ROUTINE TESTING W REFLEX): HIV 1&2 Ab, 4th Generation: NONREACTIVE

## 2014-11-06 LAB — TROPONIN I: Troponin I: <0.3 ng/mL (ref <0.30)

## 2014-11-06 MED ORDER — LEVOFLOXACIN 750 MG PO TABS: 750.0000 mg | ORAL_TABLET | Freq: Every day | ORAL | Status: AC

## 2014-11-06 NOTE — Plan of Care (Signed)
Problem: Phase I Progression Outcomes Goal: OOB as tolerated unless otherwise ordered Outcome: Completed/Met Date Met:  11/06/14 Goal: First antibiotic given within 6hrs of admit Outcome: Completed/Met Date Met:  11/06/14 Goal: Confirm chest x-ray completed Outcome: Completed/Met Date Met:  11/06/14 Goal: Code status addressed with pt/family Outcome: Completed/Met Date Met:  11/06/14 Goal: Initial discharge plan identified Outcome: Progressing Goal: Voiding-avoid urinary catheter unless indicated Outcome: Completed/Met Date Met:  11/06/14 Goal: Hemodynamically stable Outcome: Completed/Met Date Met:  11/06/14 Goal: Other Phase I Outcomes/Goals Outcome: Completed/Met Date Met:  11/06/14

## 2014-11-06 NOTE — Plan of Care (Signed)
Problem: Phase II Progression Outcomes Goal: Wean O2 if indicated Outcome: Not Applicable Date Met:  12/90/64

## 2014-11-06 NOTE — Discharge Instructions (Signed)

## 2014-11-06 NOTE — Discharge Summary (Signed)
Physician Discharge Summary  Cassie Campbell:[id number] DOB: [birth year] DOA: 11/05/2014  PCP: Christinia Gully, MD  Admit date: 11/05/2014 Discharge date: 11/06/2014  Time spent: 25 minutes  Recommendations for Outpatient Follow-up:  1. D/c home with PCP follow up in 2 weeks. Recommend repeat CXR in 4-6 weeks to evaluate resolution of PNA.  Discharge Diagnoses:  Principal Problem:   HCAP (healthcare-associated pneumonia)  Active Problems:   Hyperlipidemia with target LDL less than 70   Essential hypertension   Moderate persistent asthma in adult without complication   CAD (coronary artery disease)   Chest pain, atypical   Discharge Condition: fair  Diet recommendation: cardiac  Filed Weights   11/05/14 1400 11/06/14 0500  Weight: 58.599 kg (129 lb 3 oz) 58.559 kg (129 lb 1.6 oz)    History of present illness:  Please refer to admission H&P for details, but in brief, 73 y/o female severe chronic asthma, Htn, Hld, recent admission 1/27-10/31 NSTEMI-nonamenable to PCI. Last Echo LVEF 93-23%-FTDDUKGU diastolic dysfucntion. Since she was discharged from hospital she's been doing well. Has chronic cough which is unchanged.  She awoke on the morning of 11/17 at 3:15 AM with predominant central chest pain 8/10 relieved by nitroglycerin 1 dose. In the ED vitals were stable. EKG unchanged. She had no further chest pain. CXR showed rt lower lobe PNA. A CT angiogram chest done was negative for PNA but shows RML and RLL pneumonia questionable for aspiration. Admitted for HCAP.  Hospital Course:  Healthcare associated PNA  presenting symptoms was pleuritic chest pain. Afebrile and no leucocytosis. Blood cx on admission negative growth  till date.  on empiric vanco and cefepime. No further chest pain and  stable overnight. Can be discharged home on oral Levaquin for total 5 days course. Doubt aspiration clinically. Needs follow up CXR in 4-6 weeks to evaluate resolution.  Atypical  chest pain  pleuritic likely due to PNA. Stable on tele. EKG and serial troponin negative.  CAD with recent NSTEMI  cardiac cath done with unsuccessful PCI. stable. continue meds. Has follow up with Dr Claiborne Billings.  Hypokalemia replenished   Chronic persistent asthma  stable. No wheezing . Resume home meds.   Procedures:  CT angiogram chest  Consultations:  none  Discharge Exam: Filed Vitals:   11/06/14 0500  BP: 118/68  Pulse: 85  Temp: 98.6 F (37 C)  Resp: 15    General:NAD HEENT: moist mucosa Cardiovascular: N S1&S2, no murmurs Chest: clear b/l, no added sounds  ABD: soft, NT, ND,BS+ Ext: warm, no edema   Discharge Instructions You were cared for by a hospitalist during your hospital stay. If you have any questions about your discharge medications or the care you received while you were in the hospital after you are discharged, you can call the unit and asked to speak with the hospitalist on call if the hospitalist that took care of you is not available. Once you are discharged, your primary care physician will handle any further medical issues. Please note that NO REFILLS for any discharge medications will be authorized once you are discharged, as it is imperative that you return to your primary care physician (or establish a relationship with a primary care physician if you do not have one) for your aftercare needs so that they can reassess your need for medications and monitor your lab values.   Current Discharge Medication List    START taking these medications   Details  levofloxacin (LEVAQUIN) 750 MG tablet Take 1 tablet (  750 mg total) by mouth daily. Qty: 4 tablet, Refills: 0      CONTINUE these medications which have NOT CHANGED   Details  amLODipine (NORVASC) 10 MG tablet take 1 tablet by mouth once daily Qty: 90 tablet, Refills: 3    atorvastatin (LIPITOR) 80 MG tablet Take 1 tablet (80 mg total) by mouth daily at 6 PM. Qty: 60 tablet, Refills: 0     beclomethasone (QVAR) 80 MCG/ACT inhaler Inhale 2 puffs into the lungs 2 (two) times daily.     budesonide-formoterol (SYMBICORT) 160-4.5 MCG/ACT inhaler Inhale 2 puffs into the lungs daily.     Calcium Carbonate-Vit D-Min 1200-1000 MG-UNIT CHEW Chew 1 tablet by mouth daily.     cetirizine (ZYRTEC) 10 MG tablet Take 10 mg by mouth at bedtime as needed for allergies.     dextromethorphan-guaiFENesin (MUCINEX DM) 30-600 MG per 12 hr tablet Take 1-2 tablets by mouth every 12 (twelve) hours as needed (cough).     famotidine (PEPCID) 20 MG tablet Take 20 mg by mouth at bedtime.     isosorbide mononitrate (IMDUR) 60 MG 24 hr tablet Take 1 tablet (60 mg total) by mouth daily. Qty: 60 tablet, Refills: 0    metoprolol tartrate (LOPRESSOR) 25 MG tablet Take 1 & 1/2 tablet twice a day Qty: 90 tablet, Refills: 6    montelukast (SINGULAIR) 10 MG tablet Take 10 mg by mouth at bedtime.    Multiple Vitamin (MULTIVITAMIN) capsule Take 1 capsule by mouth daily.      nitroGLYCERIN (NITROSTAT) 0.4 MG SL tablet Place 1 tablet (0.4 mg total) under the tongue every 5 (five) minutes as needed for chest pain. Qty: 30 tablet, Refills: 0    omeprazole (PRILOSEC) 20 MG capsule Take 20 mg by mouth daily.     ranolazine (RANEXA) 1000 MG SR tablet Take 1 tablet (1,000 mg total) by mouth 2 (two) times daily. Qty: 60 tablet, Refills: 6    ticagrelor (BRILINTA) 90 MG TABS tablet Take 1 tablet (90 mg total) by mouth 2 (two) times daily. Qty: 120 tablet, Refills: 0    albuterol (PROAIR HFA) 108 (90 BASE) MCG/ACT inhaler Inhale 1-2 puffs into the lungs every 4 (four) hours as needed for wheezing.     clotrimazole-betamethasone (LOTRISONE) cream Apply 1 application topically 2 (two) times daily as needed (itching).     meclizine (ANTIVERT) 50 MG tablet Take 25-50 mg by mouth 3 (three) times daily as needed for dizziness.     mometasone (NASONEX) 50 MCG/ACT nasal spray Place 1-2 sprays into the nose 2 (two) times  daily as needed (allergies).     oxymetazoline (AFRIN) 0.05 % nasal spray Place 2 sprays into both nostrils every 12 (twelve) hours as needed for congestion.     sodium chloride (OCEAN) 0.65 % nasal spray Place 1 spray into the nose every 4 (four) hours as needed for congestion.     traMADol (ULTRAM) 50 MG tablet take 1 to 2 tablets by mouth every 6 to 8 hours if needed for pain Qty: 60 tablet, Refills: 0       Allergies  Allergen Reactions  . Codeine Phosphate Nausea Only  . Aspirin Swelling and Rash  . Diphenhydramine Hcl Rash  . Fish-Derived Products Swelling and Rash  . Penicillins Swelling and Rash   Follow-up Information    Follow up with Christinia Gully, MD. Schedule an appointment as soon as possible for a visit in 2 weeks.   Specialty:  Pulmonary Disease  Contact information:   [address] [place] [address] [phone]        The results of significant diagnostics from this hospitalization (including imaging, microbiology, ancillary and laboratory) are listed below for reference.    Significant Diagnostic Studies: Dg Chest 2 View  11/05/2014   CLINICAL DATA:  Chest pain and shortness of Breath.  EXAM: CHEST  2 VIEW  COMPARISON:  10/15/2014  FINDINGS: The cardiac silhouette, mediastinal and hilar contours are within normal limits. There is right lower lobe airspace consolidation and or atelectasis. This is somewhat concerning given the prior chest x-ray showing similar findings. I would recommend a followup chest CT with contrast to exclude the possibility of an obstructing lesion. The left lung is clear. The bony thorax is intact.  IMPRESSION: Persistent and or recurrent right lower lobe process is worrisome for a postobstructive abnormality. Recommend chest CT with contrast for further evaluation.   Electronically Signed   By: Kalman Jewels M.D.   On: 11/05/2014 08:11   Ct Angio Chest Pe W/cm &/or Wo Cm  11/05/2014   CLINICAL DATA:  73 year old female awoke  with severe chest pain and shortness of Breath. Initial encounter. Personal history of myocardial infarction last month.  EXAM: CT ANGIOGRAPHY CHEST WITH CONTRAST  TECHNIQUE: Multidetector CT imaging of the chest was performed using the standard protocol during bolus administration of intravenous contrast. Multiplanar CT image reconstructions and MIPs were obtained to evaluate the vascular anatomy.  CONTRAST:  22mL OMNIPAQUE IOHEXOL 350 MG/ML SOLN  COMPARISON:  Chest radiographs 0746 hr the same day and earlier.  FINDINGS: Adequate contrast bolus timing in the pulmonary arterial tree. Lower lobe respiratory motion artifact.  No focal filling defect identified in the pulmonary arterial tree to suggest the presence of acute pulmonary embolism.  The trachea and carina are patent. Major airways in the left lung are patent. There is opacification of the bronchus intermedius (series 6, image 42) with what appears to be bubbly or sub solid material. This continues distally and there is associated partial collapse or consolidation at the right lung base which appears to be in the middle lobe posteriorly. There is additional nodular and confluent opacity in the right lower lobe, with consolidation in the costophrenic angle. Small air bronchograms or less likely tiny cavitary changes within the right middle lobe disease (series 6, image 50).  No definite hilar lymphadenopathy. No mediastinal lymphadenopathy (right peritracheal node measuring 7 mm short axis is within normal limits). No pleural or pericardial effusion.  The right upper rib lobe and left lung are clear except for dependent atelectasis.  Negative thoracic inlet. Coronary artery calcified plaque appears extensive. Negative visible aorta. No cardiomegaly.  Negative visualized liver, spleen, pancreas, adrenal glands, renal upper poles, and bowel in the upper abdomen.  No acute osseous abnormality identified. Chronic appearing and probably degenerative superior  endplate deformity of T7. Lower cervical ACDF.  Review of the MIP images confirms the above findings.  IMPRESSION: 1.  No evidence of acute pulmonary embolus. 2. Right middle lobe and lower lobe consolidation / pneumonia with opacified bronchus intermedius and more distal airways. This might be due to aspiration, and difficult to exclude a early necrotizing pneumonia in the middle lobe. No hilar lymphadenopathy or obstructing mass identified. Consider further evaluation with bronchoscopy. 3. Extensive coronary artery calcified plaque.   Electronically Signed   By: Lars Pinks M.D.   On: 11/05/2014 09:34   Dg Chest Port 1 View  10/15/2014   CLINICAL DATA:  One day history of chest pain; history of asthma and hypertension  EXAM: PORTABLE CHEST - 1 VIEW  COMPARISON:  November 19, 2013  FINDINGS: There is a small area of infiltrate in the right base region. Elsewhere lungs are clear. Heart size and pulmonary vascularity are normal. No pneumothorax. No adenopathy. There is postoperative change in the lower cervical spine region.  IMPRESSION: Infiltrate right base.   Electronically Signed   By: Lowella Grip M.D.   On: 10/15/2014 08:52    Microbiology: Recent Results (from the past 240 hour(s))  Culture, blood (routine x 2) Call MD if unable to obtain prior to antibiotics being given     Status: None (Preliminary result)   Collection Time: 11/05/14  1:52 PM  Result Value Ref Range Status   Specimen Description BLOOD RIGHT ARM  Final   Special Requests BOTTLES DRAWN AEROBIC AND ANAEROBIC 10CC  Final   Culture  Setup Time   Final    11/05/2014 23:21 Performed at [facility]    Culture   Final           BLOOD CULTURE RECEIVED NO GROWTH TO DATE CULTURE WILL BE HELD FOR 5 DAYS BEFORE ISSUING A FINAL NEGATIVE REPORT Performed at [facility]    Report Status PENDING  Incomplete  Culture, blood (routine x 2) Call MD if unable to obtain prior to antibiotics being given     Status: None  (Preliminary result)   Collection Time: 11/05/14  2:05 PM  Result Value Ref Range Status   Specimen Description BLOOD LEFT ARM  Final   Special Requests   Final    BOTTLES DRAWN AEROBIC AND ANAEROBIC 4CC AER,3CC ANA   Culture  Setup Time   Final    11/05/2014 23:20 Performed at [facility]    Culture   Final           BLOOD CULTURE RECEIVED NO GROWTH TO DATE CULTURE WILL BE HELD FOR 5 DAYS BEFORE ISSUING A FINAL NEGATIVE REPORT Performed at [facility]    Report Status PENDING  Incomplete     Labs: Basic Metabolic Panel:  Recent Labs Lab 11/05/14 0732 11/05/14 1352  NA 139  --   K 3.3*  --   CL 100  --   CO2 23  --   GLUCOSE 116*  --   BUN 8  --   CREATININE 0.56 0.54  CALCIUM 9.6  --    Liver Function Tests:  Recent Labs Lab 11/05/14 0732  AST 41*  ALT 49*  ALKPHOS 70  BILITOT 0.5  PROT 7.7  ALBUMIN 3.8   No results for input(s): LIPASE, AMYLASE in the last 168 hours. No results for input(s): AMMONIA in the last 168 hours. CBC:  Recent Labs Lab 11/05/14 0732 11/05/14 1352  WBC 6.7 9.0  NEUTROABS 5.1  --   HGB 13.2 14.2  HCT 38.5 41.3  MCV 88.9 89.0  PLT 541* 551*   Cardiac Enzymes:  Recent Labs Lab 11/05/14 1352 11/05/14 1921 11/06/14 0013  TROPONINI <0.30 <0.30 <0.30   BNP: BNP (last 3 results) No results for input(s): PROBNP in the last 8760 hours. CBG: No results for input(s): GLUCAP in the last 168 hours.     SignedLouellen Molder  [facility] 11/06/2014, 8:39 AM

## 2014-11-09 NOTE — Plan of Care (Signed)
Problem: Discharge Progression Outcomes Goal: Barriers To Progression Addressed/Resolved Outcome: Completed/Met Date Met:  11/06/14 Goal: Discharge plan in place and appropriate Outcome: Completed/Met Date Met:  11/06/14 Goal: O2 sats at patient's baseline Outcome: Completed/Met Date Met:  11/06/14 Goal: Pain controlled with appropriate interventions Outcome: Completed/Met Date Met:  11/06/14 Goal: Hemodynamically stable Outcome: Completed/Met Date Met:  41/93/79 Goal: Complications resolved/controlled Outcome: Completed/Met Date Met:  11/06/14 Goal: Tolerating diet Outcome: Completed/Met Date Met:  11/06/14 Goal: Activity appropriate for discharge plan Outcome: Completed/Met Date Met:  11/06/14 Goal: Vaccine documented on D/C instructions Outcome: Completed/Met Date Met:  11/06/14 Goal: Other Discharge Outcomes/Goals Outcome: Completed/Met Date Met:  11/06/14

## 2014-11-11 LAB — CULTURE, BLOOD (ROUTINE X 2)
CULTURE: NO GROWTH
Culture: NO GROWTH

## 2014-11-27 ENCOUNTER — Other Ambulatory Visit: Payer: Self-pay | Admitting: Internal Medicine

## 2014-11-28 ENCOUNTER — Ambulatory Visit (INDEPENDENT_AMBULATORY_CARE_PROVIDER_SITE_OTHER): Payer: Medicare Other | Admitting: Internal Medicine

## 2014-11-28 ENCOUNTER — Encounter (HOSPITAL_COMMUNITY): Payer: Self-pay | Admitting: Cardiovascular Disease

## 2014-11-28 ENCOUNTER — Ambulatory Visit (INDEPENDENT_AMBULATORY_CARE_PROVIDER_SITE_OTHER)
Admission: RE | Admit: 2014-11-28 | Discharge: 2014-11-28 | Disposition: A | Discharge status: Home / Self Care | Payer: Medicare Other | Source: Ambulatory Visit | Attending: Internal Medicine | Admitting: Internal Medicine

## 2014-11-28 VITALS — BP 120/80 | HR 78 | Ht 59.0 in | Wt 128.0 lb

## 2014-11-28 DIAGNOSIS — J9811 Atelectasis: Secondary | ICD-10-CM

## 2014-11-28 DIAGNOSIS — I10 Essential (primary) hypertension: Secondary | ICD-10-CM

## 2014-11-28 DIAGNOSIS — J4540 Moderate persistent asthma, uncomplicated: Secondary | ICD-10-CM

## 2014-11-28 DIAGNOSIS — I2510 Atherosclerotic heart disease of native coronary artery without angina pectoris: Secondary | ICD-10-CM

## 2014-11-28 MED ORDER — BISOPROLOL FUMARATE 5 MG PO TABS: 5.0000 mg | ORAL_TABLET | Freq: Every day | ORAL | Status: DC

## 2014-11-28 MED ORDER — PREDNISONE 10 MG PO TABS
ORAL_TABLET | ORAL | Status: DC
Start: 2014-11-28 — End: 2014-12-19

## 2014-11-28 NOTE — Progress Notes (Signed)
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 Follow in pulmonary clinic also for primary care with hbp/ hyperlipidemia complicated by MI 80/99/83      History of Present Illness   10/14/13 [name] > laser rx for back pain  rad legs > resolved   11/19/2013 f/u ov/Hamzah Savoca re:  No chief complaint on file. A) Asthma with rml atx, rarely needs to use symbicort more than once daily on qvar 80 2bid  B) HBP C) Hyperlipidemia D) chronic  Rhinitis E) Bilateral thigh pain tramadol once day mostly in am's  F) BPV, maybe once a month needs antivert >>no changes -labs ok s for low K , IgE 296  Eosinophils ok on diff , LDL at goal    02/18/2014 Follow up Asthma , HTN and Hyperlipidemia  Returns for 3 month follow up .  Pt reports she is doing well since last ov.   No new complaints; no refills needed.  Patient had lab work done at last visit. That showed LDL at goal on statin therapy Her IgE remained elevated at 296 .  rec Continue on current regimen-keep up good work  Low fat cholesterol diet  Low salt diet  Continue with exercise as tolerated.   04/16/2014 acute  ov/Jeselle Hiser re: ? vertigo Chief Complaint  Patient presents with  . Acute Visit    Pt c/o vertigo x 3 days. Pt states she had several episodes of N/V/D just on saturday. Denies SOB and CP.   acute onset 04/13/14   30 min p tilting back head in beauty shop acute vertigo/n and v then d better on antivert with bilateral temporal ha/ better with aleve. Overall 90% improved since then p rx with antivert but feels weak and no appetite, mild HA bitermporal worse as day goes on, better with aleve (not on med list). Using more proair than usual and did not increase symbicort to 160 2bid as prev rec and on med calendar  as action plan rec For headache ok to try tramadol 50 mg every 4 hours as needed and call if not better in a week For any flare of wheeze or need for proair immediately increase the symbicort to 160 Take 2 puffs first thing in am and then another 2 puffs about 12 hours later until better for at least 5 days then taper back to just one puff each am Follow med cal  Admit Date: 10/15/2014 Discharge date: 10/19/2014    PRIMARY DISCHARGE DIAGNOSIS:  NSTEMI (non-ST elevated myocardial infarction)   Essential hypertension  GERD (gastroesophageal reflux disease)  Chest pain  Hyperlipidemia  Hypertension  CAD (coronary artery disease)  10/30/2014 post hosp f/u ov/Kadyn Chovan re: asthma in pt with now IHD Chief Complaint  Patient presents with  . HFU    Pt states that she is doing well since hospital d/c and denies any new co's today.   all med changes per cards / Dr Claiborne Billings  Not limited by breathing from desired activities  / housework ok/ walking 30 min 3 x times week at  mall stops at 74m x 5 due to sob/ not cp rec no change rx    11/28/2014 acute ov/Mistey Hoffert re: asthma flare despite qvar and symbicort max doses  Chief Complaint  Patient presents with  . Acute Visit    Pt c/o increased wheezing over the past month- esp worse at night.    Wheezing worse at 3 am, sits up and better p saba  > back to sleep,  Onset was indolent and increased as lopressor dose was increased     No obvious day to day or daytime variabilty or assoc purulent or excess mucus  or cp or chest tightness, subjective wheeze overt sinus or hb symptoms. No unusual exp hx or h/o childhood pna/ asthma or knowledge of premature birth.  Sleeping ok without nocturnal  or early am exacerbation  of respiratory  c/o's or need for noct saba. Also denies any obvious fluctuation of symptoms with weather or environmental changes or other aggravating or alleviating factors except as outlined above   Current Medications, Allergies,  Complete Past Medical History, Past Surgical History, Family History, and Social History were reviewed in [employer] record.  ROS  The following are not active complaints unless bolded sore throat, dysphagia, dental problems, itching, sneezing,  nasal congestion or excess/ purulent secretions, ear ache,   fever, chills, sweats, unintended wt loss, pleuritic or exertional cp, hemoptysis,  orthopnea pnd or leg swelling, presyncope, palpitations, heartburn, abdominal pain, anorexia, nausea, vomiting, diarrhea  or change in bowel or urinary habits, change in stools or urine, dysuria,hematuria,  rash, arthralgias, visual complaints, headache, numbness weakness or ataxia or problems with walking or coordination,  change in mood/affect or memory.               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 [place]...........................................................................Marland KitchenWert  - Td 09/2005  - Pneumovax 09/2003 and October 27, 2009  Prevnar 05/07/2014  - Colonscopy 10/28/2004  - CPX 11/19/13     - GYN per [name]'s office -Mammogram 2013 , 2014  MED CALENDAR 05/05/11 , 05/16/2013 , 08/06/2014         Objective:   Physical Exam  Wt 143 11/09/2011 >  04/12/2012  140 > 06/26/2012  139 >  11/13/2012 135 >  137 02/12/2013 >141 05/16/2013 > 07/31/2013  139 >  11/19/2013  134 >135 02/18/2014 > 04/16/2014 134 >  05/07/2014  134 >131 08/06/2014 > 10/30/2014 130 > 11/28/2014 128   Pleasant amb bf nad  HEENT: top dentures, nl turbinates, and orophanx. Nl external ear canals without cough reflex   NECK :  without JVD/Nodes/TM/ nl carotid upstrokes bilaterally   LUNGS: no acc muscle use,  Mid bilateral exp wheeze    CV:  RRR  no s3 or murmur or increase in P2, no edema   ABD:  soft and nontender with nl excursion in the supine position. No bruits or organomegaly, bowel sounds nl  MS:  warm without deformities, calf tenderness, cyanosis or clubbing  SKIN: warm and dry without lesions    NEURO:  alert, approp, no deficits on motor/cerbellar testing, nl reflexes/ no nystagmus  11/05/14 CTa Right middle lobe and lower lobe consolidation / pneumonia with opacified bronchus intermedius and more distal airways. This might be due to aspiration, and difficult to exclude a early necrotizing pneumonia in the middle lobe. No hilar lymphadenopathy or obstructing mass identified  CXR PA and Lateral:   11/28/2014 :  Persistent right middle lobe/right lower lobe opacity/ no chf     Assessment & Plan:

## 2014-11-28 NOTE — Patient Instructions (Addendum)
Prednisone 10 mg take  4 each am x 2 days,   2 each am x 2 days,  1 each am x 2 days and stop   Stop lopressor  Start bisoprolol @ 5 mg daily but if too strong(bp or pulse too low) take one half daily   Keep aptt to see Tammy - call sooner if needed Late add set up f/u p holidays to consider fob for rml/rll atx

## 2014-11-29 NOTE — Progress Notes (Signed)
Quick Note:  Spoke with pt and notified of results per Dr. [name]. Pt verbalized understanding and denied any questions.  ______ 

## 2014-12-01 NOTE — Assessment & Plan Note (Addendum)
-  Xolair rx  8/05 > 10/2006 -Mastered HFA December 30, 2008  - IgE  296 11/20/2013  - Prevnar rx 05/07/14   DDX of  difficult airways management all start with A and  include Adherence, Ace Inhibitors, Acid Reflux, Active Sinus Disease, Alpha 1 Antitripsin deficiency, Anxiety masquerading as Airways dz,  ABPA,  allergy(esp in young), Aspiration (esp in elderly), Adverse effects of DPI,  Active smokers, plus two Bs  = Bronchiectasis and Beta blocker use..and one C= CHF  Adherence is always the initial "prime suspect" and is a multilayered concern that requires a "trust but verify" approach in every patient - starting with knowing how to use medications, especially inhalers, correctly, keeping up with refills and understanding the fundamental difference between maintenance and prns vs those medications only taken for a very short course and then stopped and not refilled.  - following med calendar well - The proper method of use, as well as anticipated side effects, of a metered-dose inhaler are discussed and demonstrated to the patient. Improved effectiveness after extensive coaching during this visit to a level of approximately  90%  ? Allergy / ABPA > continue ICS plus singulair plus : Prednisone 10 mg take  4 each am x 2 days,   2 each am x 2 days,  1 each am x 2 days and stop   ? BB effect > note worse as lopressor titrated up > change to Bisoprolol (see hbp)  ? chf > not apparent on exam or cxr    Each maintenance medication was reviewed in detail including most importantly the difference between maintenance and as needed and under what circumstances the prns are to be used. This was done in the context of a medication calendar review which provided the patient with a user-friendly unambiguous mechanism for medication administration and reconciliation and provides an action plan for all active problems. It is critical that this be shown to every doctor  for modification during the office visit if  necessary so the patient can use it as a working document.

## 2014-12-01 NOTE — Assessment & Plan Note (Signed)
Strongly prefer in this setting: Bystolic, the most beta -1  selective Beta blocker available in sample form, with bisoprolol the most selective generic choice  on the market.   Try bisoprolol 5 mg daily    Each maintenance medication was reviewed in detail including most importantly the difference between maintenance and as needed and under what circumstances the prns are to be used. This was done in the context of a medication calendar review which provided the patient with a user-friendly unambiguous mechanism for medication administration and reconciliation and provides an action plan for all active problems. It is critical that this be shown to every doctor  for modification during the office visit if necessary so the patient can use it as a working document.

## 2014-12-01 NOTE — Assessment & Plan Note (Signed)
XR- 10/2010 Stable volume loss in the right middle lobe, most  apparent on the lateral view. Mild pectus excavatum deformity > no change 11/09/2011  - See CTa 11/06/14 Right middle lobe and lower lobe consolidation / pneumonia with opacified bronchus intermedius and more distal airways. This might be due to aspiration, and difficult to exclude a early necrotizing pneumonia in the middle lobe. 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

## 2014-12-07 DIAGNOSIS — R079 Chest pain, unspecified: Secondary | ICD-10-CM | POA: Insufficient documentation

## 2014-12-09 ENCOUNTER — Other Ambulatory Visit: Payer: Self-pay | Admitting: Internal Medicine

## 2014-12-19 ENCOUNTER — Encounter: Payer: Self-pay | Admitting: Cardiovascular Disease

## 2014-12-19 ENCOUNTER — Ambulatory Visit (INDEPENDENT_AMBULATORY_CARE_PROVIDER_SITE_OTHER): Payer: Medicare Other | Admitting: Cardiovascular Disease

## 2014-12-19 VITALS — BP 124/70 | HR 77 | Ht 59.0 in | Wt 129.7 lb

## 2014-12-19 DIAGNOSIS — I10 Essential (primary) hypertension: Secondary | ICD-10-CM

## 2014-12-19 DIAGNOSIS — K219 Gastro-esophageal reflux disease without esophagitis: Secondary | ICD-10-CM

## 2014-12-19 DIAGNOSIS — R0789 Other chest pain: Secondary | ICD-10-CM

## 2014-12-19 DIAGNOSIS — I2584 Coronary atherosclerosis due to calcified coronary lesion: Secondary | ICD-10-CM

## 2014-12-19 DIAGNOSIS — Z79899 Other long term (current) drug therapy: Secondary | ICD-10-CM

## 2014-12-19 DIAGNOSIS — I2510 Atherosclerotic heart disease of native coronary artery without angina pectoris: Secondary | ICD-10-CM

## 2014-12-19 DIAGNOSIS — E7800 Pure hypercholesterolemia, unspecified: Secondary | ICD-10-CM

## 2014-12-19 DIAGNOSIS — E785 Hyperlipidemia, unspecified: Secondary | ICD-10-CM

## 2014-12-19 MED ORDER — NITROGLYCERIN 0.4 MG SL SUBL: 0.4000 mg | SUBLINGUAL_TABLET | SUBLINGUAL | Status: DC | PRN

## 2014-12-19 MED ORDER — ISOSORBIDE MONONITRATE ER 60 MG PO TB24: 90.0000 mg | ORAL_TABLET | Freq: Every day | ORAL | Status: DC

## 2014-12-19 MED ORDER — ATORVASTATIN CALCIUM 80 MG PO TABS: 80.0000 mg | ORAL_TABLET | Freq: Every day | ORAL | Status: DC

## 2014-12-19 MED ORDER — RANOLAZINE ER 1000 MG PO TB12
1000.0000 mg | ORAL_TABLET | Freq: Two times a day (BID) | ORAL | Status: DC
Start: 2014-12-19 — End: 2015-06-16

## 2014-12-19 MED ORDER — TICAGRELOR 90 MG PO TABS: 90.0000 mg | ORAL_TABLET | Freq: Two times a day (BID) | ORAL | Status: DC

## 2014-12-19 NOTE — Patient Instructions (Signed)
INCREASE Isosorbide to 90mg  daily.  If you still have exertional pain with the increased Isosorbide you can increase the Zebeta from 5mg  to 7.5mg   Your physician recommends that you return for lab work in: 2 months prior to your visit with Dr. Claiborne Billings.  Your physician recommends that you schedule a follow-up appointment in: 2 months with Dr. Claiborne Billings.

## 2014-12-21 ENCOUNTER — Encounter: Payer: Self-pay | Admitting: Cardiovascular Disease

## 2014-12-21 NOTE — Progress Notes (Signed)
Patient ID: GERTRUDE BUCKS, female   DOB: [birth year], 74 y.o.   MRN: [id number]     HPI: Cassie Campbell is a 74 y.o. female who presents for follow-up cardiology evaluation.  I last saw her approximately 2 months ago.  Ms. Paglia has a history of complex CAD and had undergone catheterization by Dr. Fletcher Anon with class IV symptoms on 10/16/2014.  She was found to have diffusely diseased LAD, which was not suitable for revascularization.  She had ruled in for non-ST segment elevation myocardial infarction, which was felt to be due to a severely calcified almost subtotal RCA with severe diffuse calcified disease.  Initial attempt at PCI by Dr. Fletcher Anon was unsuccessful due to the severe calcification.  She was brought back to the laboratory the following day, and I performed a very long attempt at high-speed rotational atherectomy.  Unfortunately, due to the severely stenosed calcified lesion above the acute margin the Roto floppy wire was never be advanced distal enough due to severe disease beyond this site to allow the burr to be inserted to reach the stenosis.  Multiple attempts were made to utilize wire transfer, but even the smallest catheter was never able to pass the stenosis to allow for the Roto floppy wire to be reinserted in exchange for a Fielder XT wire, which was able to cross the stenosis and advanced distally.  Consequently, the procedure was aborted.  She was sent home on increased medication regimen with potential plans for possible follow-up evaluation depending upon symptom status.  Since hospital discharge, she has not experienced any significant recurrent chest pain.  During the hospitalization ranolazine was added initially at 500 mg twice a day toisosorbide mononitrate, amlodipine, and beta blocker therapy.  She has been treated with aspirin and Brilinta for dual antiplatelet therapy.  I saw her for initial evaluation, since she had been doing well without progressive angina symptoms,  I elected to further titrate her ranolazine to 1000 mg twice a day and further titrated her Lopressor to 37.5 mg twice a day.  Her creatinine remained normal following her contrast load and she was mildly anemic.  She now presents for follow-up evaluation.  She has a history of asthma and is on Symbicort 160-4.5and Singulair.  She also has a history of hyperlipidemia, currently on Lipitor 80 mg.  She does have GERD for which he takes Pepcid as well as omeprazole.  Since I last saw her, she developed some increased wheezing on Lopressor and she now is on bisoprolol 5 mg in place of metoprolol.  She has seen Dr. Melvyn Novas for her severe chronic asthma and in the past.  She also was found to have marked atopy with significant elevation of IgG E levels.  She feels that her breathing has improved with the changed to bisoprolol from Lopressor.   She has noticed rare episodes of chest discomfort but for the most part feels that she has been fairly stable.  Past Medical History  Diagnosis Date  . Benign positional vertigo   . Osteopenia     dexa 08/22/07 AP spine + 1.1, left femur -1.3, right femur -.8; dexa 10/06/09 +1.6, left femur =1.6, right femur -.  . Hypertension   . Hyperlipidemia     <130 ldl pos fm hx, bp  . Obesity   . Asthma     xolair s 8/05 ?11/07; mastered hfa 12/20/08  . Allergic rhinitis   . Ruptured disc, cervical   . Depression   . PONV (postoperative  nausea and vomiting)   . CAD (coronary artery disease)     a. 09/2014 NSTEMI s/p LHC with sig 2V dz. dLAD diffusely diseased and not suitable for PCI. unsuccessful RCA PCI d/t heavy calcifications    Past Surgical History  Procedure Laterality Date  . Cervical disc surgery  12/01  . Vein ligation and stripping    . Vulva /perineum biopsy  12-30-10    --epidermoid cyst  . Breast surgery  10-26-10    Rt. breast bx--for microcalcifications in rt. retroareolar region--dx was hyalinized fibroadenoma  . Bunionectomy Bilateral   . Left heart  catheterization with coronary angiogram N/A 10/16/2014    Procedure: LEFT HEART CATHETERIZATION WITH CORONARY ANGIOGRAM;  Surgeon: Blane Ohara, MD;  Location: [facility] CATH LAB;  Service: Cardiovascular;  Laterality: N/A;  . Percutaneous coronary rotoblator intervention (pci-r) N/A 10/17/2014    Procedure: PERCUTANEOUS CORONARY ROTOBLATOR INTERVENTION (PCI-R);  Surgeon: Troy Sine, MD;  Location: [facility] CATH LAB;  Service: Cardiovascular;  Laterality: N/A;    Allergies  Allergen Reactions  . Codeine Phosphate Nausea Only  . Aspirin Swelling and Rash  . Diphenhydramine Hcl Rash  . Fish-Derived Products Swelling and Rash  . Penicillins Swelling and Rash    Current Outpatient Prescriptions  Medication Sig Dispense Refill  . albuterol (PROAIR HFA) 108 (90 BASE) MCG/ACT inhaler Inhale 1-2 puffs into the lungs every 4 (four) hours as needed for wheezing.     Marland Kitchen amLODipine (NORVASC) 10 MG tablet take 1 tablet by mouth once daily 90 tablet 1  . atorvastatin (LIPITOR) 80 MG tablet Take 1 tablet (80 mg total) by mouth daily at 6 PM. 30 tablet 5  . beclomethasone (QVAR) 80 MCG/ACT inhaler Inhale 2 puffs into the lungs 2 (two) times daily.     . bisoprolol (ZEBETA) 5 MG tablet Take 1 tablet (5 mg total) by mouth daily. 30 tablet 11  . budesonide-formoterol (SYMBICORT) 160-4.5 MCG/ACT inhaler Inhale 2 puffs into the lungs daily.     . Calcium Carbonate-Vit D-Min 1200-1000 MG-UNIT CHEW Chew 1 tablet by mouth daily.     . cetirizine (ZYRTEC) 10 MG tablet Take 10 mg by mouth at bedtime as needed for allergies.     . clotrimazole-betamethasone (LOTRISONE) cream Apply 1 application topically 2 (two) times daily as needed (itching).     Marland Kitchen dextromethorphan-guaiFENesin (MUCINEX DM) 30-600 MG per 12 hr tablet Take 1-2 tablets by mouth every 12 (twelve) hours as needed (cough).     . famotidine (PEPCID) 20 MG tablet Take 20 mg by mouth at bedtime.     . isosorbide mononitrate (IMDUR) 60 MG 24 hr tablet Take 1.5  tablets (90 mg total) by mouth daily. 45 tablet 5  . meclizine (ANTIVERT) 50 MG tablet Take 25-50 mg by mouth 3 (three) times daily as needed for dizziness.     . mometasone (NASONEX) 50 MCG/ACT nasal spray Place 1-2 sprays into the nose 2 (two) times daily as needed (allergies).     . montelukast (SINGULAIR) 10 MG tablet Take 10 mg by mouth at bedtime.    . Multiple Vitamin (MULTIVITAMIN) capsule Take 1 capsule by mouth daily.      . nitroGLYCERIN (NITROSTAT) 0.4 MG SL tablet Place 1 tablet (0.4 mg total) under the tongue every 5 (five) minutes as needed for chest pain. 30 tablet 0  . omeprazole (PRILOSEC) 20 MG capsule Take 20 mg by mouth daily.     Marland Kitchen oxymetazoline (AFRIN) 0.05 % nasal spray Place 2 sprays  into both nostrils every 12 (twelve) hours as needed for congestion.     Marland Kitchen QVAR 80 MCG/ACT inhaler inhale 2 puffs by mouth twice a day 8.7 g 11  . ranolazine (RANEXA) 1000 MG SR tablet Take 1 tablet (1,000 mg total) by mouth 2 (two) times daily. 14 tablet 0  . sodium chloride (OCEAN) 0.65 % nasal spray Place 1 spray into the nose every 4 (four) hours as needed for congestion.     . SYMBICORT 160-4.5 MCG/ACT inhaler inhale 2 puffs FISRT THING IN THE MORNING AND 2 PUFFS APPOX 12 HOURS APART 10.2 g 11  . ticagrelor (BRILINTA) 90 MG TABS tablet Take 1 tablet (90 mg total) by mouth 2 (two) times daily. 24 tablet 0  . traMADol (ULTRAM) 50 MG tablet take 1 to 2 tablets by mouth every 6 to 8 hours if needed for pain 60 tablet 0   No current facility-administered medications for this visit.    History   Social History  . Marital Status: Married    Spouse Name: N/A    Number of Children: N/A  . Years of Education: N/A   Occupational History  . retired Tour manager    Social History Main Topics  . Smoking status: Never Smoker   . Smokeless tobacco: Never Used  . Alcohol Use: No  . Drug Use: No  . Sexual Activity: No   Other Topics Concern  . Not on file   Social History  Narrative    Family History  Problem Relation Age of Onset  . Diabetes Mother     AODM  . Stroke Mother   . Hypertension Mother   . Heart disease Father   . Breast cancer Daughter 36    dec--mets to liver/spine  . Diabetes Sister     AODM  . Breast cancer Sister   . Hypertension Sister     ROS General: Negative; No fevers, chills, or night sweats HEENT: Negative; No changes in vision or hearing, sinus congestion, difficulty swallowing Pulmonary: positive for asthma; No cough, wheezing, shortness of breath, hemoptysis Cardiovascular: See HPI:  GI: Negative; No nausea, vomiting, diarrhea, or abdominal pain GU: Negative; No dysuria, hematuria, or difficulty voiding Musculoskeletal: Negative; no myalgias, joint pain, or weakness Hematologic: Negative; no easy bruising, bleeding Endocrine: Negative; no heat/cold intolerance; no diabetes, Neuro: Negative; no changes in balance, headaches Skin: Negative; No rashes or skin lesions Psychiatric: Negative; No behavioral problems, depression Sleep: Negative; No snoring,  daytime sleepiness, hypersomnolence, bruxism, restless legs, hypnogognic hallucinations. Other comprehensive 14 point system review is negative   Physical Exam BP 124/70 mmHg  Pulse 77  Ht 4\' 11"  (1.499 m)  Wt 129 lb 11.2 oz (58.832 kg)  BMI 26.18 kg/m2  LMP 12/21/1999 General: Alert, oriented, no distress.  Skin: normal turgor, no rashes, warm and dry HEENT: Normocephalic, atraumatic. Pupils equal round and reactive to light; sclera anicteric; extraocular muscles intact, No lid lag; Nose without nasal septal hypertrophy; Mouth/Parynx benign; Mallinpatti scale 3 Neck: No JVD, no carotid bruits; normal carotid upstroke Lungs: clear to ausculatation and percussion bilaterally; no wheezing or rales, normal inspiratory and expiratory effort Chest wall: without tenderness to palpitation Heart: PMI not displaced, RRR, s1 s2 normal, 1/6 systolic murmur, No diastolic  murmur, no rubs, gallops, thrills, or heaves Abdomen: soft, nontender; no hepatosplenomehaly, BS+; abdominal aorta nontender and not dilated by palpation. Back: no CVA tenderness Pulses: 2+; catheterization site well-healed Musculoskeletal: full range of motion, normal strength, no joint deformities Extremities: Pulses  2+, no clubbing cyanosis or edema, Homan's sign negative  Neurologic: grossly nonfocal; Cranial nerves grossly wnl Psychologic: Normal mood and affect  ECG (independently read by me): Normal sinus rhythm at 77 bpm.  Normal intervals.  No significant ST changes.  November 2015 ECG (independently read by me):sinus rhythm at 88 bpm.  QTc interval 454 ms.  No significant ST-T changes.  LABS:  BMET    Component Value Date/Time   NA 139 11/05/2014 0732   K 3.3* 11/05/2014 0732   CL 100 11/05/2014 0732   CO2 23 11/05/2014 0732   GLUCOSE 116* 11/05/2014 0732   GLUCOSE 109* 10/05/2006 1022   BUN 8 11/05/2014 0732   CREATININE 0.54 11/05/2014 1352   CREATININE 0.64 10/24/2014 1027   CALCIUM 9.6 11/05/2014 0732   GFRNONAA >90 11/05/2014 1352   GFRAA >90 11/05/2014 1352     Hepatic Function Panel     Component Value Date/Time   PROT 7.7 11/05/2014 0732   ALBUMIN 3.8 11/05/2014 0732   AST 41* 11/05/2014 0732   ALT 49* 11/05/2014 0732   ALKPHOS 70 11/05/2014 0732   BILITOT 0.5 11/05/2014 0732   BILIDIR 0.1 11/19/2013 0933     CBC    Component Value Date/Time   WBC 9.0 11/05/2014 1352   RBC 4.64 11/05/2014 1352   HGB 14.2 11/05/2014 1352   HCT 41.3 11/05/2014 1352   PLT 551* 11/05/2014 1352   MCV 89.0 11/05/2014 1352   MCH 30.6 11/05/2014 1352   MCHC 34.4 11/05/2014 1352   RDW 15.2 11/05/2014 1352   LYMPHSABS 1.1 11/05/2014 0732   MONOABS 0.4 11/05/2014 0732   EOSABS 0.2 11/05/2014 0732   BASOSABS 0.0 11/05/2014 0732     BNP    Component Value Date/Time   PROBNP 8.0 10/05/2006 1022    Lipid Panel     Component Value Date/Time   CHOL 176  11/19/2013 0933   TRIG 97.0 11/19/2013 0933   HDL 47.30 11/19/2013 0933   CHOLHDL 4 11/19/2013 0933   VLDL 19.4 11/19/2013 0933   LDLCALC 109* 11/19/2013 0933     RADIOLOGY: Dg Chest 2 View  11/28/2014   CLINICAL DATA:  Worsening wheezing since last week. Asthma. Hypertension. Heart catheterization in October.  EXAM: CHEST  2 VIEW  COMPARISON:  Chest x-ray 11/05/2014 and earlier; chest CT 11/05/2014  FINDINGS: The heart size is normal. There is persistent opacity at the right lung base, suspicious for postobstructive atelectasis or infiltrate. The appearance has been persistent over the course of many exams. No pulmonary edema. Mild degenerative changes are seen in the spine. Previous cervical fusion.  IMPRESSION: 1. Persistent right middle lobe/right lower lobe opacity, raising the question of postobstructive infiltrate. 2. Consider bronchoscopy if not already performed, given the long-standing findings.   Electronically Signed   By: Shon Hale M.D.   On: 11/28/2014 18:00      ASSESSMENT AND PLAN: Mrs. Ileigh Mettler is a 74 year old African-American female who is status post a recent non-ST segment elevation myocardial infarction, which was felt to be due to subtotal high-grade calcified RCA with severe diffuse distal disease beyond her subtotal stenosis.  She also has a severely diffusely diseased mid distal LAD which is not amenable for revascularization.  At the time of her intervention, the smallest balloon catheter that was available in the catheterization laboratory was a 1.20 and this was unable to cross the stenosis.  The catheterization laboratory now has the switch catheter which can go down to 0.75 mm,  and if the patient develops increasing recurrent symptomatology another attempt at repeat intervention may be considered.   She has done well with the increased Ranexa to 1000 g twice a day.  She could not tolerate the increased beta blocker dose with metoprolol and now is on very  low-dose bisoprolol at just 5 mg.  I will further titrate her isosorbide mononitrate to 90 mg in the morning.  And it is possible that if she still notes increased symptoms and attempt at further titration of her bisoprolol made.  She is on maximum dose amlodipine at 10 mg.  Presently, she is not wheezing on her Symbicort 160/4.5, Singulair 10 mg, QVAR inhaler.  Her GERD is stable on omeprazole.  There is no bleeding on dual antiplatelet therapy.  Where aggressively treating her lipids with atorvastatin 80 mg to induce plaque regression.  Target LDL is less than 70.  I am recommending a follow-up copy is a metabolic panel and lipid panel and will see her in 2 months for follow-up evaluation.  Time spent: 25 minutes   Troy Sine, MD, [facility]  12/21/2014 11:45 AM

## 2015-01-21 ENCOUNTER — Telehealth: Payer: Self-pay | Admitting: Cardiovascular Disease

## 2015-01-21 MED ORDER — TICAGRELOR 90 MG PO TABS: 90.0000 mg | ORAL_TABLET | Freq: Two times a day (BID) | ORAL | Status: DC

## 2015-01-21 NOTE — Telephone Encounter (Signed)
Pt called in wanting to know if she could have some samples of Ranexa and Brilinta . Please call if we have any  thanks

## 2015-01-21 NOTE — Telephone Encounter (Signed)
Patient aware samples are at the front desk for pick up  No ranexa available.

## 2015-01-28 ENCOUNTER — Ambulatory Visit (INDEPENDENT_AMBULATORY_CARE_PROVIDER_SITE_OTHER)
Admission: RE | Admit: 2015-01-28 | Discharge: 2015-01-28 | Disposition: A | Discharge status: Home / Self Care | Payer: Medicare Other | Source: Ambulatory Visit | Attending: Adult Health | Admitting: Adult Health

## 2015-01-28 ENCOUNTER — Encounter: Payer: Self-pay | Admitting: Adult Health

## 2015-01-28 ENCOUNTER — Ambulatory Visit (INDEPENDENT_AMBULATORY_CARE_PROVIDER_SITE_OTHER): Payer: Medicare Other | Admitting: Adult Health

## 2015-01-28 VITALS — BP 124/74 | HR 76 | Temp 97.9°F | Ht 59.0 in | Wt 131.2 lb

## 2015-01-28 DIAGNOSIS — I10 Essential (primary) hypertension: Secondary | ICD-10-CM

## 2015-01-28 DIAGNOSIS — J189 Pneumonia, unspecified organism: Secondary | ICD-10-CM

## 2015-01-28 DIAGNOSIS — J4540 Moderate persistent asthma, uncomplicated: Secondary | ICD-10-CM

## 2015-01-28 DIAGNOSIS — J9811 Atelectasis: Secondary | ICD-10-CM

## 2015-01-28 MED ORDER — BECLOMETHASONE DIPROPIONATE 80 MCG/ACT IN AERS: 2.0000 | INHALATION_SPRAY | Freq: Two times a day (BID) | RESPIRATORY_TRACT | Status: DC

## 2015-01-28 MED ORDER — MOMETASONE FUROATE 50 MCG/ACT NA SUSP: 2.0000 | Freq: Two times a day (BID) | NASAL | Status: DC | PRN

## 2015-01-28 MED ORDER — BUDESONIDE-FORMOTEROL FUMARATE 160-4.5 MCG/ACT IN AERO
2.0000 | INHALATION_SPRAY | Freq: Two times a day (BID) | RESPIRATORY_TRACT | Status: DC
Start: 2015-01-28 — End: 2015-12-24

## 2015-01-28 NOTE — Assessment & Plan Note (Signed)
Patient is advised on a low-fat, low-cholesterol diet along with low salt. Patient is to continue on her current regimen. Follow-up with cardiology as planned

## 2015-01-28 NOTE — Progress Notes (Signed)
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 Follow in pulmonary clinic also for primary care with hbp/ hyperlipidemia complicated by MI 80/99/83      History of Present Illness   10/14/13 [name] > laser rx for back pain  rad legs > resolved   11/19/2013 f/u ov/[name] re:  No chief complaint on file. A) Asthma with rml atx, rarely needs to use symbicort more than once daily on qvar 80 2bid  B) HBP C) Hyperlipidemia D) chronic  Rhinitis E) Bilateral thigh pain tramadol once day mostly in am's  F) BPV, maybe once a month needs antivert >>no changes -labs ok s for low K , IgE 296  Eosinophils ok on diff , LDL at goal    02/18/2014 Follow up Asthma , HTN and Hyperlipidemia  Returns for 3 month follow up .  Pt reports she is doing well since last ov.   No new complaints; no refills needed.  Patient had lab work done at last visit. That showed LDL at goal on statin therapy Her IgE remained elevated at 296 .  rec Continue on current regimen-keep up good work  Low fat cholesterol diet  Low salt diet  Continue with exercise as tolerated.   04/16/2014 acute  ov/[name] re: ? vertigo Chief Complaint  Patient presents with  . Acute Visit    Pt c/o vertigo x 3 days. Pt states she had several episodes of N/V/D just on saturday. Denies SOB and CP.   acute onset 04/13/14   30 min p tilting back head in beauty shop acute vertigo/n and v then d better on antivert with bilateral temporal ha/ better with aleve. Overall 90% improved since then p rx with antivert but feels weak and no appetite, mild HA bitermporal worse as day goes on, better with aleve (not on med list). Using more proair than usual and did not increase symbicort to 160 2bid as prev rec and on med calendar  as action plan rec For headache ok to try tramadol 50 mg every 4 hours as needed and call if not better in a week For any flare of wheeze or need for proair immediately increase the symbicort to 160 Take 2 puffs first thing in am and then another 2 puffs about 12 hours later until better for at least 5 days then taper back to just one puff each am Follow med cal  Admit Date: 10/15/2014 Discharge date: 10/19/2014    PRIMARY DISCHARGE DIAGNOSIS:  NSTEMI (non-ST elevated myocardial infarction)   Essential hypertension  GERD (gastroesophageal reflux disease)  Chest pain  Hyperlipidemia  Hypertension  CAD (coronary artery disease)  10/30/2014 post hosp f/u ov/[name] re: asthma in pt with now IHD Chief Complaint  Patient presents with  . HFU    Pt states that she is doing well since hospital d/c and denies any new co's today.   all med changes per cards / Dr Claiborne Billings  Not limited by breathing from desired activities  / housework ok/ walking 30 min 3 x times week at  mall stops at 74m x 5 due to sob/ not cp rec no change rx    11/28/2014 acute ov/[name] re: asthma flare despite qvar and symbicort max doses  Chief Complaint  Patient presents with  . Acute Visit    Pt c/o increased wheezing over the past month- esp worse at night.    Wheezing worse at 3 am, sits up and better p saba  > back to sleep,  Onset was indolent and increased as lopressor dose was increased  >>d/c lopressor , rx bisoprolol , pred taper   01/28/2015 Follow up and Med Review    Patient returns for 6 week follow-up and medication review We reviewed all her medications organized them into a medication calendar with patient education . Pt has had 2 hospitalization over last 4 months with NSTEMI in  Oct and HCAP in Nov.  She is starting to feel better , getting back to her baseline.  Last office visit was changed from Lopressor to bisoprolol. Due to wheezing Patient says that she is improved with resolution of  wheezing. Chest x-ray last visit showed a persistent right middle and right lower lobe infiltrate. She denies any hemoptysis, unintentional weight loss, chest pain, orthopnea, PND, leg swelling, or syncope. She is being followed by cardiology for her underlying coronary artery disease.  Current Medications, Allergies, Complete Past Medical History, Past Surgical History, Family History, and Social History were reviewed in [employer] record.  ROS  The following are not active complaints unless bolded sore throat, dysphagia, dental problems, itching, sneezing,  nasal congestion or excess/ purulent secretions, ear ache,   fever, chills, sweats, unintended wt loss, pleuritic or exertional cp, hemoptysis,  orthopnea pnd or leg swelling, presyncope, palpitations, heartburn, abdominal pain, anorexia, nausea, vomiting, diarrhea  or change in bowel or urinary habits, change in stools or urine, dysuria,hematuria,  rash, arthralgias, visual complaints, headache, numbness weakness or ataxia or problems with walking or coordination,  change in mood/affect or memory.               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 [place]...........................................................................Marland KitchenWert  - Td 09/2005  - Pneumovax 09/2003 and October 27, 2009  Prevnar 05/07/2014  - Colonscopy 10/28/2004  - CPX  11/19/13     - GYN per [name]'s office -Mammogram 2013 , 2014  MED CALENDAR 05/05/11 , 05/16/2013 , 08/06/2014 , 01/28/2015         Objective:   Physical Exam  Wt 143 11/09/2011 >  04/12/2012  140 > 06/26/2012  139 >  11/13/2012 135 >  137 02/12/2013 >141 05/16/2013 > 07/31/2013  139 >  11/19/2013  134 >135 02/18/2014 > 04/16/2014 134 > 05/07/2014  134 >131 08/06/2014 > 10/30/2014 130 > 11/28/2014 128 >131   Pleasant amb bf nad  HEENT: top dentures, nl turbinates, and orophanx. Nl external ear canals without cough reflex   NECK :  without JVD/Nodes/TM/ nl carotid upstrokes bilaterally   LUNGS: no acc muscle use, CTA w/ no wheezing    CV:  RRR  no s3 or murmur or increase in P2, no edema   ABD:  soft and nontender with nl excursion in the supine position. No bruits or organomegaly, bowel sounds nl  MS:  warm without deformities, calf tenderness, cyanosis or clubbing  SKIN: warm and dry without lesions    NEURO:  alert, approp, no deficits on motor/cerbellar testing, nl reflexes/ no nystagmus      11/05/14 CTa Right middle lobe and lower lobe consolidation / pneumonia with opacified bronchus intermedius and more distal airways. This might be due to aspiration, and difficult to exclude a early necrotizing pneumonia in the middle lobe. No hilar lymphadenopathy or obstructing mass identified  CXR PA and Lateral:   11/28/2014 :  Persistent right middle lobe/right lower lobe opacity/ no chf     Assessment & Plan:

## 2015-01-28 NOTE — Assessment & Plan Note (Signed)
Blood pressure controlled on current regimen. Wheezing resolved off of nonselective beta blocker. Continue on current regimen

## 2015-01-28 NOTE — Assessment & Plan Note (Signed)
Persistent right middle lobe and right lower lobe atelectasis/consolidation Repeat chest x-ray today. If persistent, consider bronchoscopy

## 2015-01-28 NOTE — Patient Instructions (Signed)
Follow med calendar closely and bring to each visit.  Chest xray today  Low fat cholesterol diet  Keep salt intake low.  Follow up Dr. Melvyn Novas  In 6-8 weeks and As needed

## 2015-01-28 NOTE — Addendum Note (Signed)
Addended by: Parke Poisson E on: 01/28/2015 10:05 AM   Modules accepted: Orders, Medications

## 2015-01-28 NOTE — Assessment & Plan Note (Signed)
Compensated on present regimen Improved control off of nonselective beta blocker Patient's medications were reviewed today and patient education was given. Computerized medication calendar was adjusted/completed   Plan  Continue on current regimen

## 2015-01-28 NOTE — Progress Notes (Signed)
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

## 2015-01-29 ENCOUNTER — Encounter: Payer: Medicare Other | Admitting: Adult Health

## 2015-02-04 ENCOUNTER — Encounter (HOSPITAL_COMMUNITY): Payer: Self-pay

## 2015-02-05 ENCOUNTER — Encounter (HOSPITAL_COMMUNITY)
Admission: RE | Disposition: A | Discharge status: Home / Self Care | Payer: Self-pay | Source: Ambulatory Visit | Attending: Internal Medicine

## 2015-02-05 ENCOUNTER — Ambulatory Visit (HOSPITAL_COMMUNITY)
Admission: RE | Admit: 2015-02-05 | Discharge: 2015-02-05 | Disposition: A | Discharge status: Home / Self Care | Payer: Medicare Other | Source: Ambulatory Visit | Attending: Internal Medicine | Admitting: Internal Medicine

## 2015-02-05 DIAGNOSIS — E669 Obesity, unspecified: Secondary | ICD-10-CM | POA: Diagnosis not present

## 2015-02-05 DIAGNOSIS — K219 Gastro-esophageal reflux disease without esophagitis: Secondary | ICD-10-CM | POA: Diagnosis not present

## 2015-02-05 DIAGNOSIS — I10 Essential (primary) hypertension: Secondary | ICD-10-CM | POA: Diagnosis not present

## 2015-02-05 DIAGNOSIS — M8580 Other specified disorders of bone density and structure, unspecified site: Secondary | ICD-10-CM | POA: Insufficient documentation

## 2015-02-05 DIAGNOSIS — I252 Old myocardial infarction: Secondary | ICD-10-CM | POA: Diagnosis not present

## 2015-02-05 DIAGNOSIS — J4540 Moderate persistent asthma, uncomplicated: Secondary | ICD-10-CM | POA: Diagnosis not present

## 2015-02-05 DIAGNOSIS — I2510 Atherosclerotic heart disease of native coronary artery without angina pectoris: Secondary | ICD-10-CM | POA: Insufficient documentation

## 2015-02-05 DIAGNOSIS — E785 Hyperlipidemia, unspecified: Secondary | ICD-10-CM | POA: Insufficient documentation

## 2015-02-05 DIAGNOSIS — J189 Pneumonia, unspecified organism: Secondary | ICD-10-CM | POA: Diagnosis not present

## 2015-02-05 DIAGNOSIS — J9811 Atelectasis: Secondary | ICD-10-CM | POA: Insufficient documentation

## 2015-02-05 HISTORY — PX: VIDEO BRONCHOSCOPY: SHX5072

## 2015-02-05 SURGERY — VIDEO BRONCHOSCOPY WITHOUT FLUORO
Anesthesia: Moderate Sedation | Laterality: Bilateral

## 2015-02-05 MED ORDER — MIDAZOLAM HCL 10 MG/2ML IJ SOLN
INTRAMUSCULAR | Status: DC | PRN
  Administered 2015-02-05: 2.5 mg via INTRAVENOUS

## 2015-02-05 MED ORDER — SODIUM CHLORIDE 0.9 % IV SOLN
INTRAVENOUS | Status: DC
  Administered 2015-02-05: 07:00:00 via INTRAVENOUS

## 2015-02-05 MED ORDER — PHENYLEPHRINE HCL 0.25 % NA SOLN: 1.0000 | Freq: Four times a day (QID) | NASAL | Status: DC | PRN

## 2015-02-05 MED ORDER — LIDOCAINE HCL 1 % IJ SOLN
INTRAMUSCULAR | Status: DC | PRN
  Administered 2015-02-05: 6 mL via RESPIRATORY_TRACT

## 2015-02-05 MED ORDER — MEPERIDINE HCL 25 MG/ML IJ SOLN
INTRAMUSCULAR | Status: DC | PRN
  Administered 2015-02-05: 50 mg via INTRAVENOUS

## 2015-02-05 MED ORDER — PHENYLEPHRINE HCL 0.25 % NA SOLN
NASAL | Status: DC | PRN
  Administered 2015-02-05: 2 via NASAL

## 2015-02-05 MED ORDER — LIDOCAINE HCL 2 % EX GEL: 1.0000 "application " | Freq: Once | CUTANEOUS | Status: DC

## 2015-02-05 MED ORDER — MEPERIDINE HCL 100 MG/ML IJ SOLN: 100.0000 mg | Freq: Once | INTRAMUSCULAR | Status: DC

## 2015-02-05 MED ORDER — MIDAZOLAM HCL 10 MG/2ML IJ SOLN: 1.0000 mg | Freq: Once | INTRAMUSCULAR | Status: DC

## 2015-02-05 MED ORDER — MEPERIDINE HCL 100 MG/ML IJ SOLN
INTRAMUSCULAR | Status: AC
  Filled 2015-02-05: qty 2

## 2015-02-05 MED ORDER — LIDOCAINE HCL 2 % EX GEL
CUTANEOUS | Status: DC | PRN
  Administered 2015-02-05: 1

## 2015-02-05 MED ORDER — MIDAZOLAM HCL 10 MG/2ML IJ SOLN
INTRAMUSCULAR | Status: AC
  Filled 2015-02-05: qty 4

## 2015-02-05 NOTE — H&P (Signed)
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 Follow in pulmonary clinic also for primary care with hbp/ hyperlipidemia complicated by MI 41/96/22    History of Present Illness  10/14/13 [name] > laser rx for back pain rad legs > resolved   11/19/2013 f/u ov/[name] re:  No chief complaint on file. A) Asthma with rml atx, rarely needs to use symbicort more than once daily on qvar 80 2bid  B) HBP C) Hyperlipidemia D) chronic Rhinitis E) Bilateral thigh pain tramadol once day mostly in am's  F) BPV, maybe once a month needs antivert >>no changes -labs ok s for low K , IgE 296  Eosinophils ok on diff , LDL at goal    02/18/2014 Follow up Asthma , HTN and Hyperlipidemia  Returns for 3 month follow up .  Pt reports she is doing well since last ov.  No new complaints; no refills needed.  Patient had lab work done at last visit. That showed LDL at goal on statin therapy Her IgE remained elevated at 296 .  rec Continue on current regimen-keep up good work  Low fat cholesterol diet  Low salt diet  Continue with exercise as tolerated.   04/16/2014 acute ov/[name] re: ? vertigo Chief Complaint  Patient presents with  . Acute Visit    Pt c/o vertigo x 3 days. Pt states she had several episodes of N/V/D just on saturday. Denies SOB and CP.   acute onset 04/13/14 30 min p tilting back head in beauty shop acute vertigo/n and v then d better on antivert with bilateral temporal ha/ better with aleve. Overall 90% improved since then p rx with antivert but feels weak and no appetite, mild HA bitermporal worse as day goes on, better with aleve (not on med list). Using more proair than usual and did not increase symbicort to 160 2bid as prev rec and on med calendar as action plan rec For headache ok to try tramadol 50 mg every 4 hours as  needed and call if not better in a week For any flare of wheeze or need for proair immediately increase the symbicort to 160 Take 2 puffs first thing in am and then another 2 puffs about 12 hours later until better for at least 5 days then taper back to just one puff each am Follow med cal  Admit Date: 10/15/2014 Discharge date: 10/19/2014   PRIMARY DISCHARGE DIAGNOSIS:  NSTEMI (non-ST elevated myocardial infarction)  Essential hypertension  GERD (gastroesophageal reflux disease)  Chest pain  Hyperlipidemia  Hypertension  CAD (coronary artery disease)  10/30/2014 post hosp f/u ov/[name] re: asthma in pt with now IHD Chief Complaint  Patient presents with  . HFU    Pt states that she is doing well since hospital d/c and denies any new co's today.   all med changes per cards / Dr Claiborne Billings  Not limited by breathing from desired activities / housework ok/ walking 30 min 3 x times week at mall stops at 29m x 5 due to sob/ not cp rec no change rx    11/28/2014 acute ov/[name] re: asthma flare despite qvar and symbicort max doses  Chief Complaint  Patient presents with  . Acute Visit    Pt c/o increased wheezing over the past month- esp worse at night.   Wheezing worse at 3 am, sits up and better  p saba > back to sleep,  Onset was indolent and increased as lopressor dose was increased  >>d/c lopressor , rx bisoprolol , pred taper   01/28/2015 Follow up and Med Review  Patient returns for 6 week follow-up and medication review We reviewed all her medications organized them into a medication calendar with patient education . Pt has had 2 hospitalization over last 4 months with NSTEMI in Oct and HCAP in Nov.  She is starting to feel better , getting back to her baseline.  Last office visit was changed from Lopressor to bisoprolol. Due to wheezing Patient says that she is improved with resolution of wheezing. Chest x-ray last visit showed a persistent right  middle and right lower lobe infiltrate. She denies any hemoptysis, unintentional weight loss, chest pain, orthopnea, PND, leg swelling, or syncope. She is being followed by cardiology for her underlying coronary artery disease.  Current Medications, Allergies, Complete Past Medical History, Past Surgical History, Family History, and Social History were reviewed in [employer] record.  ROS The following are not active complaints unless bolded sore throat, dysphagia, dental problems, itching, sneezing, nasal congestion or excess/ purulent secretions, ear ache, fever, chills, sweats, unintended wt loss, pleuritic or exertional cp, hemoptysis, orthopnea pnd or leg swelling, presyncope, palpitations, heartburn, abdominal pain, anorexia, nausea, vomiting, diarrhea or change in bowel or urinary habits, change in stools or urine, dysuria,hematuria, rash, arthralgias, visual complaints, headache, numbness weakness or ataxia or problems with walking or coordination, change in mood/affect or memory.        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)  OSTEOPENIA (ICD-733.90)  - DEXA 08/22/07 AP spine + 1.1, left femur -1.3, right femur -.8  - DEXA 10/06/09 + 1.6 left femur -1.6, right femur -.5  -DEXA 2014 no change  HYPERTENSION (ICD-401.9)  HYPERLIPIDEMIA (ICD-272.4)  - Target < 130 ldl pos fm hx, hbp  OBESITY  - Target wt = 178 for BMI < 30  ASTHMA (ICD-493.90) ([name] not active)  -Xolair s 8/05 > 10/2006  -Mastered HFA December 30, 2008  ALLERGIC RHINITIS (ICD-477.9)  S/p cervical fusion 2000 ............................................................................ [place]...........................................................................Marland KitchenWert  - Td 09/2005  - Pneumovax 09/2003 and October 27, 2009 Prevnar 05/07/2014  - Colonscopy 10/28/2004  - CPX 11/19/13  - GYN per [name]'s  office -Mammogram 2013 , 2014  MED CALENDAR 05/05/11 , 05/16/2013 , 08/06/2014 , 01/28/2015       Objective:  Physical Exam  Wt 143 11/09/2011 > 04/12/2012 140 > 06/26/2012 139 > 11/13/2012 135 > 137 02/12/2013 >141 05/16/2013 > 07/31/2013 139 > 11/19/2013 134 >135 02/18/2014 > 04/16/2014 134 > 05/07/2014 134 >131 08/06/2014 > 10/30/2014 130 > 11/28/2014 128 >131   Pleasant amb bf nad  HEENT: top dentures, nl turbinates, and orophanx. Nl external ear canals without cough reflex   NECK : without JVD/Nodes/TM/ nl carotid upstrokes bilaterally   LUNGS: no acc muscle use, CTA w/ no wheezing    CV: RRR no s3 or murmur or increase in P2, no edema   ABD: soft and nontender with nl excursion in the supine position. No bruits or organomegaly, bowel sounds nl  MS: warm without deformities, calf tenderness, cyanosis or clubbing  SKIN: warm and dry without lesions   NEURO: alert, approp, no deficits on motor/cerbellar testing, nl reflexes/ no nystagmus   11/05/14 CTa Right middle lobe and lower lobe consolidation / pneumonia with opacified bronchus intermedius and more distal airways. This might be due to aspiration, and  difficult to exclude a early necrotizing pneumonia in the middle lobe. No hilar lymphadenopathy or obstructing mass identified  CXR PA and Lateral: 11/28/2014 : Persistent right middle lobe/right lower lobe opacity/ no chf    Assessment & Plan:              CAD (coronary artery disease) - Tammy S Parrett, NP at 01/28/2015 9:42 AM     Status: Written Related Problem: CAD (coronary artery disease)   Expand All Collapse All   Patient is advised on a low-fat, low-cholesterol diet along with low salt. Patient is to continue on her current regimen. Follow-up with cardiology as planned            Essential hypertension - Melvenia Needles, NP at 01/28/2015 9:42 AM     Status: Written Related Problem: Essential hypertension   Expand  All Collapse All   Blood pressure controlled on current regimen. Wheezing resolved off of nonselective beta blocker. Continue on current regimen            Atelectasis - Tammy S Parrett, NP at 01/28/2015 9:43 AM     Status: Written Related Problem: Atelectasis   Expand All Collapse All   Persistent right middle lobe and right lower lobe atelectasis/consolidation Repeat chest x-ray today. 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   No change hx or exam.   Christinia Gully, MD Pulmonary and [place] [phone] After 5:30 PM or weekends, call [phone]

## 2015-02-05 NOTE — Discharge Instructions (Signed)
Flexible Bronchoscopy, Care After These instructions give you information on caring for yourself after your procedure. Your doctor may also give you more specific instructions. Call your doctor if you have any problems or questions after your procedure. HOME CARE  Do not eat or drink anything for 2 hours after your procedure. If you try to eat or drink before the medicine wears off, food or drink could go into your lungs. You could also burn yourself.  After 2 hours have passed and when you can cough and gag normally, you may eat soft food and drink liquids slowly.  The day after the test, you may eat your normal diet.  You may do your normal activities.  Keep all doctor visits. GET HELP RIGHT AWAY IF:  You get more and more short of breath.  You get light-headed.  You feel like you are going to pass out (faint).  You have chest pain.  You have new problems that worry you.  You cough up more than a little blood.  You cough up more blood than before. MAKE SURE YOU:  Understand these instructions.  Will watch your condition.  Will get help right away if you are not doing well or get worse. Document Released: 10/03/2009 Document Revised: 12/11/2013 Document Reviewed: 08/10/2013 [facility] Patient Information 2015 [place], [place]. This information is not intended to replace advice given to you by your health care provider. Make sure you discuss any questions you have with your health care provider.  Do not eat or drink until after 1020 today 02/05/15

## 2015-02-05 NOTE — Op Note (Signed)
Bronchoscopy Procedure Note  Date of Operation: 02/05/2015   Pre-op Diagnosis: atx RLL  Post-op Diagnosis: same/ no tumor identified nor focal obstruction once airway suctioned free of thick mucoid secretions   Surgeon: Christinia Gully  Anesthesia: Monitored Local Anesthesia with Sedation  Operation: Video Flexible fiberoptic bronchoscopy, diagnostic   Findings:  lots of mucus plugging, diffused airway edema/ collapse on exp  Specimen: BAL RLL  Estimated Blood Loss: none  Complications: none  Indications and History: See updated H and P same date. The risks, benefits, complications, treatment options and expected outcomes were discussed with the patient.  The possibilities of reaction to medication, pulmonary aspiration, perforation of a viscus, bleeding, failure to diagnose a condition and creating a complication requiring transfusion or operation were discussed with the patient who freely signed the consent.    Description of Procedure: The patient was re-examined in the bronchoscopy suite and the site of surgery properly noted/marked.  The patient was identified  and the procedure verified as Flexible Fiberoptic Bronchoscopy.  A Time Out was held and the above information confirmed.   After the induction of topical nasopharyngeal anesthesia, the patient was positioned  and the bronchoscope was passed through the L naris. The vocal cords were visualized and  1% buffered lidocaine 5 ml was topically placed onto the cords. The cords were nl. The scope was then passed into the trachea.  1% buffered lidocaine given topically. Airways inspected bilaterally to the subsegmental level with the following findings:   lots of mucus plugging, diffused airway edema/ collapse on exp, no endobronchial lesions    Interventions:  BAL RLL for cyt/ routine and afb/fungal stain and culture   The Patient was taken to the Endoscopy Recovery area in satisfactory condition.  Attestation: I performed  the procedure.    Christinia Gully, MD Pulmonary and [place] [phone] After 5:30 PM or weekends, call [phone]

## 2015-02-05 NOTE — Progress Notes (Signed)
Video bronchoscopy performed Intervention bronchial washings Pt tolerated well  Elveta Rape David RRT  

## 2015-02-06 ENCOUNTER — Encounter (HOSPITAL_COMMUNITY): Payer: Self-pay | Admitting: Internal Medicine

## 2015-02-06 NOTE — Progress Notes (Signed)
Quick Note:  Spoke with pt and notified of results per Dr. [name]. Pt verbalized understanding and denied any questions.  ______ 

## 2015-02-08 ENCOUNTER — Other Ambulatory Visit: Payer: Self-pay | Admitting: Internal Medicine

## 2015-02-09 ENCOUNTER — Other Ambulatory Visit: Payer: Self-pay | Admitting: Internal Medicine

## 2015-02-09 LAB — CULTURE, RESPIRATORY: SPECIAL REQUESTS: NORMAL

## 2015-02-10 ENCOUNTER — Other Ambulatory Visit: Payer: Self-pay | Admitting: Internal Medicine

## 2015-02-10 MED ORDER — CIPROFLOXACIN HCL 750 MG PO TABS: 750.0000 mg | ORAL_TABLET | Freq: Two times a day (BID) | ORAL | Status: DC

## 2015-02-10 NOTE — Progress Notes (Signed)
Quick Note:  Spoke with pt and notified of results per Dr. [name]. Pt verbalized understanding and denied any questions.  ______ 

## 2015-02-15 ENCOUNTER — Other Ambulatory Visit: Payer: Self-pay | Admitting: Internal Medicine

## 2015-02-18 ENCOUNTER — Ambulatory Visit (INDEPENDENT_AMBULATORY_CARE_PROVIDER_SITE_OTHER)
Admission: RE | Admit: 2015-02-18 | Discharge: 2015-02-18 | Disposition: A | Discharge status: Home / Self Care | Payer: Medicare Other | Source: Ambulatory Visit | Attending: Internal Medicine | Admitting: Internal Medicine

## 2015-02-18 ENCOUNTER — Telehealth: Payer: Self-pay | Admitting: Cardiovascular Disease

## 2015-02-18 ENCOUNTER — Encounter: Payer: Self-pay | Admitting: Internal Medicine

## 2015-02-18 ENCOUNTER — Other Ambulatory Visit: Payer: Self-pay | Admitting: Cardiovascular Disease

## 2015-02-18 ENCOUNTER — Ambulatory Visit (INDEPENDENT_AMBULATORY_CARE_PROVIDER_SITE_OTHER): Payer: Medicare Other | Admitting: Internal Medicine

## 2015-02-18 VITALS — BP 118/60 | HR 71 | Ht 59.0 in | Wt 132.8 lb

## 2015-02-18 DIAGNOSIS — J4540 Moderate persistent asthma, uncomplicated: Secondary | ICD-10-CM

## 2015-02-18 DIAGNOSIS — J189 Pneumonia, unspecified organism: Secondary | ICD-10-CM

## 2015-02-18 DIAGNOSIS — J9811 Atelectasis: Secondary | ICD-10-CM

## 2015-02-18 NOTE — Telephone Encounter (Signed)
°  1. Which medications need to be refilled? Brilinta 90mg   2. Which pharmacy is medication to be sent to?[facility] on [address]  3. Do they need a 30 day or 90 day supply? 2 month supply   4. Would they like a call back once the medication has been sent to the pharmacy? yes

## 2015-02-18 NOTE — Telephone Encounter (Signed)
Rx refill sent to patient pharmacy   

## 2015-02-18 NOTE — Progress Notes (Signed)
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 Follow in pulmonary clinic also for primary care with hbp/ hyperlipidemia complicated by MI 95/18/84 and persistent mucus plug in RML/ RLLL s/p fob 02/05/15 with marked improvement p lavage and no endobrchial lesions, just diffuse airway swelling      History of Present Illness   10/14/13 Cassie Campbell > laser rx for back pain  rad legs > resolved    Admit Date: 10/15/2014 Discharge date: 10/19/2014    PRIMARY DISCHARGE DIAGNOSIS:  NSTEMI (non-ST elevated myocardial infarction)   Essential hypertension  GERD (gastroesophageal reflux disease)  Chest pain  Hyperlipidemia  Hypertension  CAD (coronary artery disease)  11/28/2014 acute ov/Cassie Campbell re: asthma flare despite qvar and symbicort max doses  Chief Complaint  Patient presents with  . Acute Visit    Pt c/o increased wheezing over the past month- esp worse at night.    Wheezing worse at 3 am, sits up and better p saba  > back to sleep,  Onset was indolent and increased as lopressor dose was increased  >>d/c lopressor , rx bisoprolol , pred taper   01/28/2015 Follow up and Med Review    Patient returns for 6 week follow-up and medication review We reviewed all her medications organized them into a medication calendar with patient education . Pt has had 2 hospitalization over last 4 months with NSTEMI in  Oct and HCAP in Nov.  She is starting to feel better , getting back to her baseline.  Last office visit was changed from Lopressor to bisoprolol. Due to wheezing Patient says that she is improved with resolution of wheezing. Chest x-ray last visit showed a persistent right middle and right lower lobe infiltrate. She denies any hemoptysis, unintentional  weight loss, chest pain, orthopnea, PND, leg swelling, or syncope. She is being followed by cardiology for her underlying coronary artery disease. rec Fob next step > 02/05/15 copious thick plugs/ pseudomonas sensitive to cipro rx  750 bid x 10d     02/18/2015 f/u  office visit/ Cassie Campbell  Re abpa with mucus plugs Chief Complaint  Patient presents with  . Follow-up    CXR was done today. Pt states that her breathing is doing well. She rarely uses rescue inhaler. No new co's today.        No obvious day to day or daytime variabilty or assoc chronic cough or cp or chest tightness, subjective wheeze overt sinus or hb symptoms. No unusual exp hx or h/o childhood pna/ asthma or knowledge of premature birth.  Sleeping ok without nocturnal  or early am exacerbation  of respiratory  c/o's or need for noct saba. Also denies any obvious fluctuation of symptoms with weather or environmental changes or other aggravating or alleviating factors except as outlined above   Current Medications, Allergies, Complete Past Medical History, Past Surgical History, Family History, and Social History were reviewed in [employer] record.  ROS  The following are not active complaints unless bolded sore throat, dysphagia, dental problems, itching, sneezing,  nasal congestion or excess/ purulent secretions, ear ache,   fever, chills, sweats, unintended wt loss, pleuritic or exertional cp, hemoptysis,  orthopnea pnd or leg swelling, presyncope, palpitations, heartburn, abdominal pain, anorexia, nausea,  vomiting, diarrhea  or change in bowel or urinary habits, change in stools or urine, dysuria,hematuria,  rash, arthralgias, visual complaints, headache, numbness weakness or ataxia or problems with walking or coordination,  change in mood/affect or memory.                    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)  OSTEOPENIA (ICD-733.90)  - DEXA 08/22/07 AP spine + 1.1, left femur  -1.3, right femur -.8  - DEXA 10/06/09 + 1.6 left femur -1.6, right femur -.5   -DEXA 2014 no change  HYPERTENSION (ICD-401.9)  HYPERLIPIDEMIA (ICD-272.4)  - Target < 130 ldl pos fm hx, hbp  OBESITY  - Target wt = 178 for BMI < 30  ASTHMA (ICD-493.90) (Cassie Campbell not active)  -Xolair s 8/05 > 10/2006  -Mastered HFA December 30, 2008  ALLERGIC RHINITIS (ICD-477.9)  S/p cervical fusion 2000 ............................................................................ Cassie Campbell...........................................................................Marland KitchenWert  - Td 09/2005  - Pneumovax 09/2003 and October 27, 2009  Prevnar 05/07/2014  - Colonscopy 10/28/2004  - CPX 11/19/13     - GYN per C Cassie Campbell's office -Mammogram 2013 , 2014  MED CALENDAR 05/05/11 , 05/16/2013 , 08/06/2014 , 01/28/2015         Objective:   Physical Exam  Wt 143 11/09/2011 >  04/12/2012  140 > 06/26/2012  139 >  11/13/2012 135 >  137 02/12/2013 >141 05/16/2013 > 07/31/2013  139 >  11/19/2013  134 >135 02/18/2014 > 04/16/2014 134 > 05/07/2014  134 >131 08/06/2014 > 10/30/2014 130 > 11/28/2014 128 > 02/18/2015   133   Pleasant amb bf nad  HEENT: top dentures, nl turbinates, and orophanx. Nl external ear canals without cough reflex   NECK :  without JVD/Nodes/TM/ nl carotid upstrokes bilaterally   LUNGS: no acc muscle use, CTA w/ no wheezing    CV:  RRR  no s3 or murmur or increase in P2, no edema   ABD:  soft and nontender with nl excursion in the supine position. No bruits or organomegaly, bowel sounds nl  MS:  warm without deformities, calf tenderness, cyanosis or clubbing  SKIN: warm and dry without lesions    NEURO:  alert, approp, no deficits on motor/cerbellar testing, nl reflexes/ no nystagmus     CXR PA and Lateral:   02/18/2015 :     I personally reviewed images and agree with radiology impression as follows:   No acute cardiopulmonary disease. Right base pleural parenchymal scarring.    Assessment &  Plan:

## 2015-02-18 NOTE — Patient Instructions (Signed)
For cough/ congestion use mucinex dm and the flutter valve as much as possible   See calendar for specific medication instructions and bring it back for each and every office visit for every healthcare provider you see.  Without it,  you may not receive the best quality medical care that we feel you deserve.  You will note that the calendar groups together  your maintenance  medications that are timed at particular times of the day.  Think of this as your checklist for what your doctor has instructed you to do until your next evaluation to see what benefit  there is  to staying on a consistent group of medications intended to keep you well.  The other group at the bottom is entirely up to you to use as you see fit  for specific symptoms that may arise between visits that require you to treat them on an as needed basis.  Think of this as your action plan or "what if" list.   Separating the top medications from the bottom group is fundamental to providing you adequate care going forward.

## 2015-02-18 NOTE — Telephone Encounter (Signed)
Rx was sent in earlier this morning due to Rx request received from pharmacy. Called patient and let her know. Patient voiced understanding.

## 2015-02-19 ENCOUNTER — Encounter: Payer: Self-pay | Admitting: Internal Medicine

## 2015-02-19 LAB — COMPREHENSIVE METABOLIC PANEL
ALK PHOS: 65 U/L (ref 39–117)
ALT: 28 U/L (ref 0–35)
AST: 30 U/L (ref 0–37)
Albumin: 4.2 g/dL (ref 3.5–5.2)
BILIRUBIN TOTAL: 0.8 mg/dL (ref 0.2–1.2)
BUN: 10 mg/dL (ref 6–23)
CO2: 25 mEq/L (ref 19–32)
CREATININE: 0.76 mg/dL (ref 0.50–1.10)
Calcium: 9.6 mg/dL (ref 8.4–10.5)
Chloride: 104 mEq/L (ref 96–112)
Glucose, Bld: 93 mg/dL (ref 70–99)
Potassium: 4.1 mEq/L (ref 3.5–5.3)
Sodium: 140 mEq/L (ref 135–145)
Total Protein: 7.5 g/dL (ref 6.0–8.3)

## 2015-02-19 LAB — LIPID PANEL
CHOL/HDL RATIO: 2.3 ratio
CHOLESTEROL: 159 mg/dL (ref 0–200)
HDL: 70 mg/dL (ref >=46)
LDL Cholesterol: 70 mg/dL (ref 0–99)
TRIGLYCERIDES: 95 mg/dL (ref <150)
VLDL: 19 mg/dL (ref 0–40)

## 2015-02-19 NOTE — Assessment & Plan Note (Signed)
S/p cipro x 10 days completeed 02/16/15 > resolved with min band like atx RLL ant basal segment

## 2015-02-19 NOTE — Assessment & Plan Note (Signed)
XR- 10/2010 Stable volume loss in the right middle lobe vs anterobasal seg RLL, most  apparent on the lateral view. Mild pectus excavatum deformity > no change 11/09/2011  - See CTa 11/06/14 Right middle lobe and lower lobe consolidation / pneumonia with opacified bronchus intermedius and more distal airways. This might be due to aspiration, and difficult to exclude a early necrotizing pneumonia in the middle lobe. No hilar lymphadenopathy or obstructing mass identified - FOB 02/05/15 > copious mucopurulent plugs R > L with neg cytology but mod growth pseudomonas sens to cipro > rec cipro 750 bid x 10 days - 02/18/15  cxr  Repeat much better, nothing on pa view at all now, band like atx RLL ant basal segment  rec rx underlying airways dz

## 2015-02-19 NOTE — Assessment & Plan Note (Signed)
(  [name] not active)  -Xolair rx  8/05 > 10/2006 -Mastered HFA December 30, 2008  - IgE  296 11/20/2013  - Prevnar rx 05/07/14     I had an extended discussion with the patient reviewing all relevant studies completed to date and  lasting 15 to 20 minutes of a 25 minute visit on the following ongoing concerns:    Each maintenance medication was reviewed in detail including most importantly the difference between maintenance and as needed and under what circumstances the prns are to be used. This was done in the context of a medication calendar review which provided the patient with a user-friendly unambiguous mechanism for medication administration and reconciliation and provides an action plan for all active problems. It is critical that this be shown to every doctor  for modification during the office visit if necessary so the patient can use it as a working document.      May re check IgE level if cxr clinical deterioration but for now leave well enough alone

## 2015-02-24 ENCOUNTER — Ambulatory Visit (INDEPENDENT_AMBULATORY_CARE_PROVIDER_SITE_OTHER): Payer: Medicare Other | Admitting: Cardiovascular Disease

## 2015-02-24 ENCOUNTER — Encounter: Payer: Self-pay | Admitting: Cardiovascular Disease

## 2015-02-24 VITALS — BP 122/80 | HR 68 | Ht 59.0 in | Wt 132.1 lb

## 2015-02-24 DIAGNOSIS — R0789 Other chest pain: Secondary | ICD-10-CM

## 2015-02-24 DIAGNOSIS — I2584 Coronary atherosclerosis due to calcified coronary lesion: Secondary | ICD-10-CM

## 2015-02-24 DIAGNOSIS — I2510 Atherosclerotic heart disease of native coronary artery without angina pectoris: Secondary | ICD-10-CM

## 2015-02-24 DIAGNOSIS — I10 Essential (primary) hypertension: Secondary | ICD-10-CM | POA: Diagnosis not present

## 2015-02-24 DIAGNOSIS — E785 Hyperlipidemia, unspecified: Secondary | ICD-10-CM | POA: Diagnosis not present

## 2015-02-24 MED ORDER — TICAGRELOR 90 MG PO TABS: 90.0000 mg | ORAL_TABLET | Freq: Two times a day (BID) | ORAL | Status: DC

## 2015-02-24 MED ORDER — ISOSORBIDE MONONITRATE ER 60 MG PO TB24: ORAL_TABLET | ORAL | Status: DC

## 2015-02-24 NOTE — Progress Notes (Signed)
Patient ID: [name], female   DOB: [birth year], 74 y.o.   MRN: [id number]     HPI: Cassie Campbell is a 74 y.o. female who presents for 3 month follow-up cardiology evaluation.    Cassie Campbell has a history of complex CAD and underwent catheterization by Dr. Fletcher Anon with class IV symptoms on 10/16/2014.  She was found to have diffusely diseased LAD, which was not suitable for revascularization.  She had ruled in for non-ST segment elevation myocardial infarction, felt to be due to a severely calcified almost subtotal RCA with severe diffuse calcified disease.  Initial attempt at PCI by Dr. Fletcher Anon was unsuccessful due to the severe calcification.  She was brought back to the laboratory the following day, and I performed a very long attempt at high-speed rotational atherectomy.  Unfortunately, due to the severely stenosed calcified lesion above the acute margin the Roto floppy wire was never be advanced distal enough due to severe disease beyond this site to allow the burr to be inserted to reach the stenosis.  Multiple attempts were made to utilize wire transfer, but even the smallest catheter was never able to pass the stenosis to allow for the Roto floppy wire to be reinserted in exchange for a Fielder XT wire, which was able to cross the stenosis and advanced distally.  Consequently, the procedure was aborted.  She was sent home on increased medication regimen with potential plans for possible follow-up evaluation depending upon symptom status.  Since hospital discharge, she has not experienced any significant recurrent chest pain.  During the hospitalization ranolazine was added initially at 500 mg twice a day toisosorbide mononitrate, amlodipine, and beta blocker therapy.  She has been treated with aspirin and Brilinta for dual antiplatelet therapy.  I saw her for subsequent evaluation and elected to further titrate her ranolazine to 1000 mg twice a day and further titrated her Lopressor to 37.5  mg twice a day.  Her creatinine remained normal following her contrast load and she was mildly anemic.    She has a history of asthma and is on Symbicort 160-4.5and Singulair.  She also has a history of hyperlipidemia, currently on Lipitor 80 mg.  She does have GERD for which he takes Pepcid as well as omeprazole.  Since I last saw her, she developed some increased wheezing on Lopressor and she now is on bisoprolol 5 mg in place of metoprolol.  She has seen Dr. Melvyn Novas for her severe chronic asthma and in the past.  She also was found to have marked atopy with significant elevation of IgG E levels.  She feels that her breathing has improved with the changed to bisoprolol from Lopressor.   Since I last saw her in December, she has remained fairly stable.  However, last evening she developed recurrent episode of chest pressure around midnight, which last approximately 10-15 minutes and ultimately resolved with 1 sublingual nitroglycerin.  She presents for evaluation.    Past Medical History  Diagnosis Date  . Benign positional vertigo   . Osteopenia     dexa 08/22/07 AP spine + 1.1, left femur -1.3, right femur -.8; dexa 10/06/09 +1.6, left femur =1.6, right femur -.  . Hypertension   . Hyperlipidemia     <130 ldl pos fm hx, bp  . Obesity   . Asthma     xolair s 8/05 ?11/07; mastered hfa 12/20/08  . Allergic rhinitis   . Ruptured disc, cervical   . Depression   . PONV (  postoperative nausea and vomiting)   . CAD (coronary artery disease)     a. 09/2014 NSTEMI s/p LHC with sig 2V dz. dLAD diffusely diseased and not suitable for PCI. unsuccessful RCA PCI d/t heavy calcifications    Past Surgical History  Procedure Laterality Date  . Cervical disc surgery  12/01  . Vein ligation and stripping    . Vulva /perineum biopsy  12-30-10    --epidermoid cyst  . Breast surgery  10-26-10    Rt. breast bx--for microcalcifications in rt. retroareolar region--dx was hyalinized fibroadenoma  . Bunionectomy  Bilateral   . Left heart catheterization with coronary angiogram N/A 10/16/2014    Procedure: LEFT HEART CATHETERIZATION WITH CORONARY ANGIOGRAM;  Surgeon: Blane Ohara, MD;  Location[facility] C[facility] LAB;  Service: Cardiovascular;  Laterality: N/A;  . Percutaneous coronary rotoblator intervention (pci-r) N/A 10/17/2014    Procedure: PERCUTANEOUS CORONARY ROTOBLATOR INTERVENTION (PCI-R);  Surgeon: Troy Sine, MD;  Location: [facility] CATH LAB;  Service: Cardiovascular;  Laterality: N/A;  . Video bronchoscopy Bilateral 02/05/2015    Procedure: VIDEO BRONCHOSCOPY WITHOUT FLUORO;  Surgeon: Tanda Rockers, MD;  Location: [facility] ENDOSCOPY;  Service: Endoscopy;  Laterality: Bilateral;    Allergies  Allergen Reactions  . Codeine Phosphate Nausea Only  . Aspirin Swelling and Rash  . Diphenhydramine Hcl Rash  . Fish-Derived Products Swelling and Rash  . Penicillins Swelling and Rash    Current Outpatient Prescriptions  Medication Sig Dispense Refill  . albuterol (PROAIR HFA) 108 (90 BASE) MCG/ACT inhaler 1-2 every 4-6 hours as needed for wheezing    . amLODipine (NORVASC) 10 MG tablet take 1 tablet by mouth once daily 90 tablet 1  . atorvastatin (LIPITOR) 80 MG tablet Take 1 tablet (80 mg total) by mouth daily at 6 PM. 30 tablet 5  . bisoprolol (ZEBETA) 5 MG tablet Take 1 tablet by mouth daily.  1  . BRILINTA 90 MG TABS tablet TAKE 1 TABLET BY MOUTH TWICE DAILY 180 tablet 2  . Calcium Carbonate-Vit D-Min 1200-1000 MG-UNIT CHEW Chew 1 tablet by mouth daily.     . cetirizine (ZYRTEC) 10 MG tablet Take 10 mg by mouth at bedtime as needed for allergies.     . clotrimazole-betamethasone (LOTRISONE) cream Apply 1 application topically 2 (two) times daily as needed (itching).     Marland Kitchen dextromethorphan-guaiFENesin (MUCINEX DM) 30-600 MG per 12 hr tablet Take 1-2 tablets by mouth every 12 (twelve) hours as needed (cough).     . famotidine (PEPCID) 20 MG tablet Take 20 mg by mouth at bedtime.     . isosorbide  mononitrate (IMDUR) 60 MG 24 hr tablet Take 1.5 tablets (90 mg total) by mouth daily. 45 tablet 5  . meclizine (ANTIVERT) 25 MG tablet TAKE 2 TABLETS BY MOUTH  3 TIMES DAILY AS NEEDED 24 tablet 5  . montelukast (SINGULAIR) 10 MG tablet Take 10 mg by mouth at bedtime.    . Multiple Vitamin (MULTIVITAMIN) capsule Take 1 capsule by mouth daily.      . nitroGLYCERIN (NITROSTAT) 0.4 MG SL tablet Place 1 tablet (0.4 mg total) under the tongue every 5 (five) minutes as needed for chest pain. 30 tablet 0  . omeprazole (PRILOSEC) 20 MG capsule Take 20 mg by mouth daily.     Marland Kitchen oxymetazoline (AFRIN) 0.05 % nasal spray Place 2 sprays into both nostrils every 12 (twelve) hours as needed for congestion.     Marland Kitchen QVAR 80 MCG/ACT inhaler inhale 2 puffs by mouth twice  a day 8.7 g 11  . ranolazine (RANEXA) 1000 MG SR tablet Take 1 tablet (1,000 mg total) by mouth 2 (two) times daily. 14 tablet 0  . sodium chloride (OCEAN) 0.65 % nasal spray Place 1 spray into the nose every 4 (four) hours as needed for congestion.     . SYMBICORT 160-4.5 MCG/ACT inhaler inhale 2 puffs FISRT THING IN THE MORNING AND 2 PUFFS APPOX 12 HOURS APART 10.2 g 11  . traMADol (ULTRAM) 50 MG tablet Take 50 mg by mouth every 4 (four) hours as needed (severe cough or pain).    . budesonide-formoterol (SYMBICORT) 160-4.5 MCG/ACT inhaler Inhale 2 puffs into the lungs 2 (two) times daily. 1 Inhaler 0  . mometasone (NASONEX) 50 MCG/ACT nasal spray Place 2 sprays into the nose 2 (two) times daily as needed. 17 g 0   No current facility-administered medications for this visit.    History   Social History  . Marital Status: Married    Spouse Name: N/A  . Number of Children: N/A  . Years of Education: N/A   Occupational History  . retired Tour manager    Social History Main Topics  . Smoking status: Never Smoker   . Smokeless tobacco: Never Used  . Alcohol Use: No  . Drug Use: No  . Sexual Activity: No   Other Topics Concern  .  Not on file   Social History Narrative    Family History  Problem Relation Age of Onset  . Diabetes Mother     AODM  . Stroke Mother   . Hypertension Mother   . Heart disease Father   . Breast cancer Daughter 36    dec--mets to liver/spine  . Diabetes Sister     AODM  . Breast cancer Sister   . Hypertension Sister     ROS General: Negative; No fevers, chills, or night sweats HEENT: Negative; No changes in vision or hearing, sinus congestion, difficulty swallowing Pulmonary: positive for asthma; No cough, wheezing, shortness of breath, hemoptysis Cardiovascular: See HPI:  GI: Negative; No nausea, vomiting, diarrhea, or abdominal pain GU: Negative; No dysuria, hematuria, or difficulty voiding Musculoskeletal: Negative; no myalgias, joint pain, or weakness Hematologic: Negative; no easy bruising, bleeding Endocrine: Negative; no heat/cold intolerance; no diabetes, Neuro: Negative; no changes in balance, headaches Skin: Negative; No rashes or skin lesions Psychiatric: Negative; No behavioral problems, depression Sleep: Negative; No snoring,  daytime sleepiness, hypersomnolence, bruxism, restless legs, hypnogognic hallucinations. Other comprehensive 14 point system review is negative   Physical Exam BP 122/80 mmHg  Pulse 68  Ht _0  (1.499 m)  Wt 132 lb 1.6 oz (59.92 kg)  BMI 26.67 kg/m2  LMP 12/21/1999 General: Alert, oriented, no distress.  Skin: normal turgor, no rashes, warm and dry HEENT: Normocephalic, atraumatic. Pupils equal round and reactive to light; sclera anicteric; extraocular muscles intact, No lid lag; Nose without nasal septal hypertrophy; Mouth/Parynx benign; Mallinpatti scale 3 Neck: No JVD, no carotid bruits; normal carotid upstroke Lungs: clear to ausculatation and percussion bilaterally; no wheezing or rales, normal inspiratory and expiratory effort Chest wall: without tenderness to palpitation Heart: PMI not displaced, RRR, s1 s2 normal, 1/6  systolic murmur, No diastolic murmur, no rubs, gallops, thrills, or heaves Abdomen: soft, nontender; no hepatosplenomehaly, BS+; abdominal aorta nontender and not dilated by palpation. Back: no CVA tenderness Pulses: 2+; Musculoskeletal: full range of motion, normal strength, no joint deformities Extremities: Pulses 2+, no clubbing cyanosis or edema, Homan's sign negative  Neurologic: grossly  nonfocal; Cranial nerves grossly wnl Psychologic: Normal mood and affect  ECG (independently read by me): Normal sinus rhythm at 68 bpm.  No ectopy.  No signal been ST segment changes.  December 2015 ECG (independently read by me): Normal sinus rhythm at 77 bpm.  Normal intervals.  No significant ST changes.  November 2015 ECG (independently read by me):sinus rhythm at 88 bpm.  QTc interval 454 ms.  No significant ST-T changes.  LABS:  BMET  BMP Latest Ref Rng 02/19/2015 11/05/2014 11/05/2014  Glucose 70 - 99 mg/dL 93 - 116(H)  BUN 6 - 23 mg/dL 10 - 8  Creatinine 0.50 - 1.10 mg/dL 0.76 0.54 0.56  Sodium 135 - 145 mEq/L 140 - 139  Potassium 3.5 - 5.3 mEq/L 4.1 - 3.3(L)  Chloride 96 - 112 mEq/L 104 - 100  CO2 19 - 32 mEq/L 25 - 23  Calcium 8.4 - 10.5 mg/dL 9.6 - 9.6     Hepatic Function Panel   Hepatic Function Latest Ref Rng 02/19/2015 11/05/2014 10/24/2014  Total Protein 6.0 - 8.3 g/dL 7.5 7.7 7.0  Albumin 3.5 - 5.2 g/dL 4.2 3.8 3.9  AST 0 - 37 U/L 30 41(H) 18  ALT 0 - 35 U/L 28 49(H) 20  Alk Phosphatase 39 - 117 U/L 65 70 63  Total Bilirubin 0.2 - 1.2 mg/dL 0.8 0.5 0.6  Bilirubin, Direct 0.0 - 0.3 mg/dL - - -     CBC  CBC Latest Ref Rng 11/05/2014 11/05/2014 10/19/2014  WBC 4.0 - 10.5 K/uL 9.0 6.7 8.2  Hemoglobin 12.0 - 15.0 g/dL 14.2 13.2 11.8(L)  Hematocrit 36.0 - 46.0 % 41.3 38.5 35.1(L)  Platelets 150 - 400 K/uL 551(H) 541(H) 335     BNP    Component Value Date/Time   PROBNP 8.0 10/05/2006 1022    Lipid Panel     Component Value Date/Time   CHOL 159 02/19/2015 0912     TRIG 95 02/19/2015 0912   TRIG 85 10/05/2006 1022   HDL 70 02/19/2015 0912   CHOLHDL 2.3 02/19/2015 0912   CHOLHDL 4.3 CALC 10/05/2006 1022   VLDL 19 02/19/2015 0912   LDLCALC 70 02/19/2015 0912   Lipid Panel     RADIOLOGY: Dg Chest 2 View  02/18/2015   CLINICAL DATA:  Bronchoscopy 02/05/2015.  Asthma.  EXAM: CHEST  2 VIEW  COMPARISON:  01/28/2015.  FINDINGS: Mediastinum and hilar structures are stable. Heart size stable. Previously identified right base atelectasis and/or scarring is less prominent on today's exam. No acute infiltrate. No pleural effusion or pneumothorax. Prior cervical spine fusion.  IMPRESSION: No acute cardiopulmonary disease. Right base pleural parenchymal scarring.   Electronically Signed   By: Marcello Moores  Register   On: 02/18/2015 13:45   Dg Chest 2 View  01/28/2015   CLINICAL DATA:  Follow-up of pneumonia ; currently asymptomatic; history of recent MI  EXAM: CHEST  2 VIEW  COMPARISON:  PA and lateral chest x-ray of November 28, 2014 and 15 October 2014 ; CT scan of the chest of November 05, 2014.  FINDINGS: There is persistently increased density in the anterior aspect of the right lower lobe. This is more conspicuous than on the study of 10 December and 15 October 2014. The right heart border is better demonstrated on today's study which did chest further improvement in the right middle lobe infiltrate. The right upper lobe is clear. The left lung is clear. The heart and pulmonary vascularity are normal. The mediastinum is normal in width.  There is degenerative disc space narrowing at multiple thoracic levels.  IMPRESSION: COPD. Persistently increased density in the anterior aspect of the right lower lobe which is more conspicuous today. Further evaluation with CT scanning of the chest is recommended to exclude a central obstructing lesion.   Electronically Signed   By: David  Martinique   On: 01/28/2015 10:23      ASSESSMENT AND PLAN: Cassie Campbell is a 74 year old  African-American female who sufferedsuffered a recent non-ST segment elevation myocardial infarction in October 2015 which was felt to be due to subtotal high-grade calcified RCA with severe diffuse distal disease beyond her subtotal stenosis.  She also has a severely diffusely diseased mid distal LAD which is not amenable for revascularization.  At the time of her intervention, the smallest balloon catheter that was available in the catheterization laboratory was a 1.20 and this was unable to cross the stenosis.  The catheterization laboratory now has the switch catheter which can go down to 0.75 mm, and if the patient develops increasing recurrent symptomatology another attempt at repeat intervention may be considered.   She has done well with the increased Ranexa to 1000 g twice a day.  She could not tolerate the increased beta blocker dose with metoprolol and now is on very low-dose bisoprolol at just 5 mg.  and I last saw her, I further titrated her isosorbide mononitrate to 90 mg.  She has done exceptionally well, but last evening developed an episode of chest pressure lasting 10 minutes.  I am recommending she take an additional 30 mg dose of isosorbide prior to going to bed, which will be a total dose of 120 mg daily.  She does not have any wheezing on exam today.  Her resting pulse is in the 60s on bisoprolol 5 mg daily.  3.  Her cholesterol is 159, HDL 70, LDL 70, VLDL 19, and triglycerides 95.  On her current dose of atorvastatin 80 mg.  She will continued on her Symbicort.  She is tolerating Brilinta 90 mg twice a day with baby aspirin for dual antiplatelet therapy.  There is no bleeding.  Her CBC is stable.  Her ECG today is stable without ST segment abnormalities.  I will see her in the office in 2-3 months for follow-up evaluation.  If she develops recurrent increasing symptoms of chest pain and soon will be necessary.  Time spent: 25 minutes  Troy Sine, MD, [facility]  02/24/2015 3:23 PM

## 2015-02-24 NOTE — Patient Instructions (Signed)
Your physician has recommended you make the following change in your medication: INCREASE the isosorbide as directed.  Your physician recommends that you schedule a follow-up appointment in: 2 months with Dr Claiborne Billings.

## 2015-03-04 LAB — FUNGUS CULTURE W SMEAR
FUNGAL SMEAR: NONE SEEN
Special Requests: NORMAL

## 2015-03-05 ENCOUNTER — Ambulatory Visit (INDEPENDENT_AMBULATORY_CARE_PROVIDER_SITE_OTHER): Payer: Medicare Other | Admitting: Obstetrics and Gynecology

## 2015-03-05 ENCOUNTER — Ambulatory Visit: Payer: Medicare Other | Admitting: Obstetrics and Gynecology

## 2015-03-05 ENCOUNTER — Encounter: Payer: Self-pay | Admitting: Obstetrics and Gynecology

## 2015-03-05 VITALS — BP 100/60 | HR 76 | Resp 14 | Ht 59.0 in | Wt 133.0 lb

## 2015-03-05 DIAGNOSIS — B373 Candidiasis of vulva and vagina: Secondary | ICD-10-CM

## 2015-03-05 DIAGNOSIS — Z01419 Encounter for gynecological examination (general) (routine) without abnormal findings: Secondary | ICD-10-CM | POA: Diagnosis not present

## 2015-03-05 DIAGNOSIS — Z124 Encounter for screening for malignant neoplasm of cervix: Secondary | ICD-10-CM

## 2015-03-05 DIAGNOSIS — B3731 Acute candidiasis of vulva and vagina: Secondary | ICD-10-CM

## 2015-03-05 MED ORDER — TRIAMCINOLONE ACETONIDE 0.1 % EX CREA: 1.0000 "application " | TOPICAL_CREAM | Freq: Two times a day (BID) | CUTANEOUS | Status: DC

## 2015-03-05 MED ORDER — NYSTATIN 100000 UNIT/GM EX CREA: 1.0000 "application " | TOPICAL_CREAM | Freq: Two times a day (BID) | CUTANEOUS | Status: DC

## 2015-03-05 NOTE — Patient Instructions (Signed)

## 2015-03-05 NOTE — Progress Notes (Signed)
Patient ID: Cassie Campbell, female   DOB: Mar 28, 1941, 74 y.o.   MRN: [id number] 74 y.o. G2P1011 MarriedAfrican AmericanF here for annual exam.    Had an MI in October 2015.  Had this on the same day she was due for a colonoscopy.  Had chest pressure and back pain radiating to her left shoulder.   Had an attempted cardiac catheterization and stent could not be placed.  [place] the mall.  No chest pain currently.     Notes rash under her pannus.  Itching.  Started after her MI and initiation of new medications.   PCP:  Christinia Gully, MD  Patient's last menstrual period was 12/21/1999.          Sexually active: No.  The current method of family planning is post menopausal status.    Exercising: Yes.    walking. Smoker:  no  Health Maintenance: Pap:  10-21-09 wnl History of abnormal Pap:  no MMG:  11-19-14 heterogeneously dense, scattered calcifications in both breasts, benign density and an intrammary node in right breast/normal:[facility].C:[facility].  Next screening 1 year. Colonoscopy:  10/2004 normal with Dr. Fuller Plan.  Patient was due in 10/2014 but Dr. Claiborne Billings (cardiologist) has advised patient not to have procedure.  BMD:   02-15-13 Osteopenia with Dr. Melvyn Novas. TDaP:  09/2005 Screening Labs:  Hb today: PCP, Urine today: PCP   reports that she has never smoked. She has never used smokeless tobacco. She reports that she does not drink alcohol or use illicit drugs.  Past Medical History  Diagnosis Date  . Benign positional vertigo   . Osteopenia     dexa 08/22/07 AP spine + 1.1, left femur -1.3, right femur -.8; dexa 10/06/09 +1.6, left femur =1.6, right femur -.  . Hypertension   . Hyperlipidemia     <130 ldl pos fm hx, bp  . Obesity   . Asthma     xolair s 8/05 ?11/07; mastered hfa 12/20/08  . Allergic rhinitis   . Ruptured disc, cervical   . Depression   . PONV (postoperative nausea and vomiting)   . CAD (coronary artery disease)     a. 09/2014 NSTEMI s/p LHC with sig 2V dz. dLAD  diffusely diseased and not suitable for PCI. unsuccessful RCA PCI d/t heavy calcifications  . Heart attack     09/2014    Past Surgical History  Procedure Laterality Date  . Cervical disc surgery  12/01  . Vein ligation and stripping    . Vulva /perineum biopsy  12-30-10    --epidermoid cyst  . Breast surgery  10-26-10    Rt. breast bx--for microcalcifications in rt. retroareolar region--dx was hyalinized fibroadenoma  . Bunionectomy Bilateral   . Left heart catheterization with coronary angiogram N/A 10/16/2014    Procedure: LEFT HEART CATHETERIZATION WITH CORONARY ANGIOGRAM;  Surgeon: Blane Ohara, MD;  Location: [facility] CATH LAB;  Service: Cardiovascular;  Laterality: N/A;  . Percutaneous coronary rotoblator intervention (pci-r) N/A 10/17/2014    Procedure: PERCUTANEOUS CORONARY ROTOBLATOR INTERVENTION (PCI-R);  Surgeon: Troy Sine, MD;  Location: [facility] CATH LAB;  Service: Cardiovascular;  Laterality: N/A;  . Video bronchoscopy Bilateral 02/05/2015    Procedure: VIDEO BRONCHOSCOPY WITHOUT FLUORO;  Surgeon: Tanda Rockers, MD;  Location: [facility] ENDOSCOPY;  Service: Endoscopy;  Laterality: Bilateral;    Current Outpatient Prescriptions  Medication Sig Dispense Refill  . albuterol (PROAIR HFA) 108 (90 BASE) MCG/ACT inhaler 1-2 every 4-6 hours as needed for wheezing    .  amLODipine (NORVASC) 10 MG tablet take 1 tablet by mouth once daily 90 tablet 1  . atorvastatin (LIPITOR) 80 MG tablet Take 1 tablet (80 mg total) by mouth daily at 6 PM. 30 tablet 5  . bisoprolol (ZEBETA) 5 MG tablet Take 1 tablet by mouth daily.  1  . Calcium Carbonate-Vit D-Min 1200-1000 MG-UNIT CHEW Chew 1 tablet by mouth daily.     . cetirizine (ZYRTEC) 10 MG tablet Take 10 mg by mouth at bedtime as needed for allergies.     . clotrimazole-betamethasone (LOTRISONE) cream Apply 1 application topically 2 (two) times daily as needed (itching).     Marland Kitchen dextromethorphan-guaiFENesin (MUCINEX DM) 30-600 MG per 12 hr tablet Take  1-2 tablets by mouth every 12 (twelve) hours as needed (cough).     . famotidine (PEPCID) 20 MG tablet Take 20 mg by mouth at bedtime.     . isosorbide mononitrate (IMDUR) 60 MG 24 hr tablet Take 1.5 tablets in the morning and 1/2 tablet in the PM. 60 tablet 6  . meclizine (ANTIVERT) 25 MG tablet TAKE 2 TABLETS BY MOUTH  3 TIMES DAILY AS NEEDED 24 tablet 5  . montelukast (SINGULAIR) 10 MG tablet Take 10 mg by mouth at bedtime.    . Multiple Vitamin (MULTIVITAMIN) capsule Take 1 capsule by mouth daily.      . nitroGLYCERIN (NITROSTAT) 0.4 MG SL tablet Place 1 tablet (0.4 mg total) under the tongue every 5 (five) minutes as needed for chest pain. 30 tablet 0  . omeprazole (PRILOSEC) 20 MG capsule Take 20 mg by mouth daily.     Marland Kitchen oxymetazoline (AFRIN) 0.05 % nasal spray Place 2 sprays into both nostrils every 12 (twelve) hours as needed for congestion.     Marland Kitchen QVAR 80 MCG/ACT inhaler inhale 2 puffs by mouth twice a day 8.7 g 11  . ranolazine (RANEXA) 1000 MG SR tablet Take 1 tablet (1,000 mg total) by mouth 2 (two) times daily. 14 tablet 0  . sodium chloride (OCEAN) 0.65 % nasal spray Place 1 spray into the nose every 4 (four) hours as needed for congestion.     . SYMBICORT 160-4.5 MCG/ACT inhaler inhale 2 puffs FISRT THING IN THE MORNING AND 2 PUFFS APPOX 12 HOURS APART 10.2 g 11  . ticagrelor (BRILINTA) 90 MG TABS tablet Take 1 tablet (90 mg total) by mouth 2 (two) times daily. 40 tablet 0  . traMADol (ULTRAM) 50 MG tablet Take 50 mg by mouth every 4 (four) hours as needed (severe cough or pain).    . budesonide-formoterol (SYMBICORT) 160-4.5 MCG/ACT inhaler Inhale 2 puffs into the lungs 2 (two) times daily. 1 Inhaler 0  . mometasone (NASONEX) 50 MCG/ACT nasal spray Place 2 sprays into the nose 2 (two) times daily as needed. 17 g 0   No current facility-administered medications for this visit.    Family History  Problem Relation Age of Onset  . Diabetes Mother     AODM  . Stroke Mother   .  Hypertension Mother   . Heart disease Father   . Breast cancer Daughter 36    dec--mets to liver/spine  . Diabetes Sister     AODM  . Breast cancer Sister   . Hypertension Sister     ROS:  Pertinent items are noted in HPI.  Otherwise, a comprehensive ROS was negative.  Exam:   BP 100/60 mmHg  Pulse 76  Resp 14  Ht 4\' 11"  (1.499 m)  Wt 133 lb (  60.328 kg)  BMI 26.85 kg/m2  LMP 12/21/1999     Height: 4\' 11"  (149.9 cm)  Ht Readings from Last 3 Encounters:  03/05/15 4\' 11"  (1.499 m)  02/24/15 4\' 11"  (1.499 m)  02/18/15 4\' 11"  (1.499 m)    General appearance: alert, cooperative and appears stated age Head: Normocephalic, without obvious abnormality, atraumatic Neck: no adenopathy, supple, symmetrical, trachea midline and thyroid normal to inspection and palpation Lungs: clear to auscultation bilaterally Breasts: normal appearance, no masses or tenderness, Inspection negative, No nipple retraction or dimpling, No nipple discharge or bleeding, No axillary or supraclavicular adenopathy Heart: regular rate and rhythm Abdomen: soft, non-tender; bowel sounds normal; no masses,  no organomegaly Extremities: extremities normal, atraumatic, no cyanosis or edema Skin: Skin color, texture, turgor normal. No rashes or lesions Lymph nodes: Cervical, supraclavicular, and axillary nodes normal. No abnormal inguinal nodes palpated Neurologic: Grossly normal   Pelvic: External genitalia:  Vulva and medial thighs with plaques of erythematous skin, dry texture.              Urethra:  normal appearing urethra with no masses, tenderness or lesions              Bartholins and Skenes: normal                 Vagina: normal appearing vagina with normal color and discharge, no lesions              Cervix: Not identified.               Pap taken: No. Bimanual Exam:  Uterus:  normal size, contour, position, consistency, mobility, non-tender              Adnexa: normal adnexa and no mass, fullness,  tenderness               Rectovaginal: Confirms               Anus:  normal sphincter tone, no lesions  Chaperone was present for exam.  A:  Well Woman visit.  Yeast vulvitis.  Osteopenia.  Recent MI.   P:   Mammogram yearly.  Discussed 3D. Triamcinolone 0.5% cream to area bid plus nystatin cream to area bid.  Mix in 1:1 ratio.  pap smears not indicated.  Discussed physical activity and Ca/Vit D in her diet daily.  return annually or prn

## 2015-03-11 ENCOUNTER — Ambulatory Visit: Payer: Medicare Other | Admitting: Internal Medicine

## 2015-03-12 ENCOUNTER — Ambulatory Visit: Payer: Medicare Other | Admitting: Internal Medicine

## 2015-03-18 ENCOUNTER — Encounter: Payer: Self-pay | Admitting: *Deleted

## 2015-03-21 ENCOUNTER — Telehealth: Payer: Self-pay | Admitting: Internal Medicine

## 2015-03-21 LAB — AFB CULTURE WITH SMEAR (NOT AT ARMC)
Acid Fast Smear: NONE SEEN
Special Requests: NORMAL

## 2015-03-21 NOTE — Telephone Encounter (Signed)
Advised pt that we have received the PA from her pharmacy. Pt requests samples because she is completely out. These will be left at the front desk.  PA phone #: [phone]. Pt ID: [id number] Spoke with Aniceto Boss at [facility]. PA initiated via telephone. It will be reviewed by a pharmacist. We will received approval or denial with in 72 hours. Will route to [name] to follow up on.

## 2015-03-24 ENCOUNTER — Encounter: Payer: Self-pay | Admitting: Gastroenterology

## 2015-03-26 MED ORDER — FLUTICASONE PROPIONATE HFA 110 MCG/ACT IN AERO: 2.0000 | INHALATION_SPRAY | Freq: Two times a day (BID) | RESPIRATORY_TRACT | Status: DC

## 2015-03-26 NOTE — Telephone Encounter (Signed)
She can have qvar sample so she doesn't go s it but ok to change to flovent 110  2 bid hfa

## 2015-03-26 NOTE — Telephone Encounter (Signed)
Received denial on Qvar. Pt must try Flovent Diskus or Flovent HFA.  MW - please advise. Thanks.

## 2015-03-26 NOTE — Telephone Encounter (Signed)
Rx for Flovent has been sent in. Pt is aware of medication change. Nothing further was needed.

## 2015-04-28 ENCOUNTER — Encounter: Payer: Self-pay | Admitting: Cardiovascular Disease

## 2015-04-28 ENCOUNTER — Ambulatory Visit (INDEPENDENT_AMBULATORY_CARE_PROVIDER_SITE_OTHER): Payer: Medicare Other | Admitting: Cardiovascular Disease

## 2015-04-28 VITALS — BP 110/70 | HR 65 | Ht 59.0 in | Wt 132.9 lb

## 2015-04-28 DIAGNOSIS — E785 Hyperlipidemia, unspecified: Secondary | ICD-10-CM | POA: Diagnosis not present

## 2015-04-28 DIAGNOSIS — I4510 Unspecified right bundle-branch block: Secondary | ICD-10-CM | POA: Diagnosis not present

## 2015-04-28 DIAGNOSIS — I2583 Coronary atherosclerosis due to lipid rich plaque: Secondary | ICD-10-CM

## 2015-04-28 DIAGNOSIS — I2584 Coronary atherosclerosis due to calcified coronary lesion: Secondary | ICD-10-CM

## 2015-04-28 DIAGNOSIS — I2510 Atherosclerotic heart disease of native coronary artery without angina pectoris: Secondary | ICD-10-CM | POA: Diagnosis not present

## 2015-04-28 NOTE — Progress Notes (Signed)
Patient ID: Cassie Campbell, female   DOB: 04/26/1941, 74 y.o.   MRN: [id number]     HPI: Cassie Campbell is a 74 y.o. female who presents for 2 month follow-up cardiology evaluation.    Ms. Carder has a history of complex CAD and underwent catheterization by Dr. Arida with class IV symptoms on 10/16/2014.  She was found to have diffusely diseased LAD, which was not suitable for revascularization.  She had ruled in for non-ST segment elevation myocardial infarction, felt to be due to a severely calcified almost subtotal RCA with severe diffuse calcified disease.  Initial attempt at PCI by Dr. Arida was unsuccessful due to the severe calcification.  She was brought back to the laboratory the following day, and I performed a very long attempt at high-speed rotational atherectomy.  Unfortunately, due to the severely stenosed calcified lesion above the acute margin the Roto floppy wire was never be advanced distal enough due to severe disease beyond this site to allow the burr to be inserted to reach the stenosis.  Multiple attempts were made to utilize wire transfer, but even the smallest catheter was never able to pass the stenosis to allow for the Roto floppy wire to be reinserted in exchange for a Fielder XT wire, which was able to cross the stenosis and advanced distally.  Consequently, the procedure was aborted.  She was sent home on increased medication regimen with potential plans for possible follow-up evaluation depending upon symptom status.  Since hospital discharge, she has not experienced any significant recurrent chest pain.  During the hospitalization ranolazine was added initially at 500 mg twice a day toisosorbide mononitrate, amlodipine, and beta blocker therapy.  She has been treated with aspirin and Brilinta for dual antiplatelet therapy.  I saw her for subsequent evaluation and elected to further titrate her ranolazine to 1000 mg twice a day and further titrated her Lopressor to 37.5  mg twice a day.  Her creatinine remained normal following her contrast load and she was mildly anemic.    She has a history of asthma and is on Symbicort 160-4.5 and Singulair.  She  has a history of hyperlipidemia, currently on Lipitor 80 mg.  She does have GERD for which he takes Pepcid as well as omeprazole.  She developed some increased wheezing on Lopressor and was changed to bisoprolol 5 mg in place of metoprolol.  She has seen Dr. [name] for her severe chronic asthma and in the past.  She also was found to have marked atopy with significant elevation of IgG E levels.  She feels that her breathing has improved with the changed to bisoprolol from Lopressor.   When I last saw her in March, she had been remaining stable but the night before had experienced a 10-15 minute episode of chest pain during the evening which responded to nitroglycerin. When I saw her the following day she had been taking isosorbide 90 mg in the morning and I added isosorbide 30 mg at bedtime to her medical regimen  Which also included Ranexa 1000 mg twice a day , amlodipine 10 mg daily, and bisoprolol 5 mg. She denies recurrent chest pain symptomatology since the nocturnal dose of Imdur was added. She has been active. She presents for follow-up evaluation.  Past Medical History  Diagnosis Date  . Benign positional vertigo   . Osteopenia     dexa 08/22/07 AP spine + 1.1, left femur -1.3, right femur -.8; dexa 10/06/09 +1.6, left femur =1.6, right femur -.  .   Hypertension   . Hyperlipidemia     <130 ldl pos fm hx, bp  . Obesity   . Asthma     xolair s 8/05 ?11/07; mastered hfa 12/20/08  . Allergic rhinitis   . Ruptured disc, cervical   . Depression   . PONV (postoperative nausea and vomiting)   . CAD (coronary artery disease)     a. 09/2014 NSTEMI s/p LHC with sig 2V dz. dLAD diffusely diseased and not suitable for PCI. unsuccessful RCA PCI d/t heavy calcifications  . Heart attack     09/2014    Past Surgical History    Procedure Laterality Date  . Cervical disc surgery  12/01  . Vein ligation and stripping    . Vulva /perineum biopsy  12-30-10    --epidermoid cyst  . Breast surgery  10-26-10    Rt. breast bx--for microcalcifications in rt. retroareolar region--dx was hyalinized fibroadenoma  . Bunionectomy Bilateral   . Left heart catheterization with coronary angiogram N/A 10/16/2014    Procedure: LEFT HEART CATHETERIZATION WITH CORONARY ANGIOGRAM;  Surgeon: Michael D Cooper, MD;  Location: [facility] CATH LAB;  Service: Cardiovascular;  Laterality: N/A;  . Percutaneous coronary rotoblator intervention (pci-r) N/A 10/17/2014    Procedure: PERCUTANEOUS CORONARY ROTOBLATOR INTERVENTION (PCI-R);  Surgeon: Thomas A Kelly, MD;  Location: [facility] CATH LAB;  Service: Cardiovascular;  Laterality: N/A;  . Video bronchoscopy Bilateral 02/05/2015    Procedure: VIDEO BRONCHOSCOPY WITHOUT FLUORO;  Surgeon: Michael B [name], MD;  Location: [facility] ENDOSCOPY;  Service: Endoscopy;  Laterality: Bilateral;    Allergies  Allergen Reactions  . Codeine Phosphate Nausea Only  . Aspirin Swelling and Rash  . Diphenhydramine Hcl Rash  . Fish-Derived Products Swelling and Rash  . Penicillins Swelling and Rash    Current Outpatient Prescriptions  Medication Sig Dispense Refill  . albuterol (PROAIR HFA) 108 (90 BASE) MCG/ACT inhaler 1-2 every 4-6 hours as needed for wheezing    . amLODipine (NORVASC) 10 MG tablet take 1 tablet by mouth once daily 90 tablet 1  . atorvastatin (LIPITOR) 80 MG tablet Take 1 tablet (80 mg total) by mouth daily at 6 PM. 30 tablet 5  . bisoprolol (ZEBETA) 5 MG tablet Take 1 tablet by mouth daily.  1  . budesonide-formoterol (SYMBICORT) 160-4.5 MCG/ACT inhaler Inhale 2 puffs into the lungs 2 (two) times daily. 1 Inhaler 0  . Calcium Carbonate-Vit D-Min 1200-1000 MG-UNIT CHEW Chew 1 tablet by mouth daily.     . cetirizine (ZYRTEC) 10 MG tablet Take 10 mg by mouth at bedtime as needed for allergies.     .  clotrimazole-betamethasone (LOTRISONE) cream Apply 1 application topically 2 (two) times daily as needed (itching).     . dextromethorphan-guaiFENesin (MUCINEX DM) 30-600 MG per 12 hr tablet Take 1-2 tablets by mouth every 12 (twelve) hours as needed (cough).     . famotidine (PEPCID) 20 MG tablet Take 20 mg by mouth at bedtime.     . fluticasone (FLOVENT HFA) 110 MCG/ACT inhaler Inhale 2 puffs into the lungs 2 (two) times daily. 1 Inhaler 5  . isosorbide mononitrate (IMDUR) 60 MG 24 hr tablet Take 1.5 tablets in the morning and 1/2 tablet in the PM. 60 tablet 6  . meclizine (ANTIVERT) 25 MG tablet TAKE 2 TABLETS BY MOUTH  3 TIMES DAILY AS NEEDED 24 tablet 5  . mometasone (NASONEX) 50 MCG/ACT nasal spray Place 2 sprays into the nose 2 (two) times daily as needed. 17 g 0  . montelukast (  SINGULAIR) 10 MG tablet Take 10 mg by mouth at bedtime.    . Multiple Vitamin (MULTIVITAMIN) capsule Take 1 capsule by mouth daily.      . nitroGLYCERIN (NITROSTAT) 0.4 MG SL tablet Place 1 tablet (0.4 mg total) under the tongue every 5 (five) minutes as needed for chest pain. 30 tablet 0  . nystatin cream (MYCOSTATIN) Apply 1 application topically 2 (two) times daily. Apply to affected area BID for up to 7 days. 30 g 1  . omeprazole (PRILOSEC) 20 MG capsule Take 20 mg by mouth daily.     . oxymetazoline (AFRIN) 0.05 % nasal spray Place 2 sprays into both nostrils every 12 (twelve) hours as needed for congestion.     . QVAR 80 MCG/ACT inhaler inhale 2 puffs by mouth twice a day 8.7 g 11  . ranolazine (RANEXA) 1000 MG SR tablet Take 1 tablet (1,000 mg total) by mouth 2 (two) times daily. 14 tablet 0  . sodium chloride (OCEAN) 0.65 % nasal spray Place 1 spray into the nose every 4 (four) hours as needed for congestion.     . ticagrelor (BRILINTA) 90 MG TABS tablet Take 1 tablet (90 mg total) by mouth 2 (two) times daily. 40 tablet 0  . traMADol (ULTRAM) 50 MG tablet Take 50 mg by mouth every 4 (four) hours as needed  (severe cough or pain).    . triamcinolone cream (KENALOG) 0.1 % Apply 1 application topically 2 (two) times daily. 30 g 1   No current facility-administered medications for this visit.    History   Social History  . Marital Status: Married    Spouse Name: N/A  . Number of Children: N/A  . Years of Education: N/A   Occupational History  . retired purchasing coordinator    Social History Main Topics  . Smoking status: Never Smoker   . Smokeless tobacco: Never Used  . Alcohol Use: No  . Drug Use: No  . Sexual Activity: No   Other Topics Concern  . Not on file   Social History Narrative    Family History  Problem Relation Age of Onset  . Diabetes Mother     AODM  . Stroke Mother   . Hypertension Mother   . Heart disease Father   . Breast cancer Daughter 36    dec--mets to liver/spine  . Diabetes Sister     AODM  . Breast cancer Sister   . Hypertension Sister     ROS General: Negative; No fevers, chills, or night sweats HEENT: Negative; No changes in vision or hearing, sinus congestion, difficulty swallowing Pulmonary: positive for asthma; No cough, wheezing, shortness of breath, hemoptysis Cardiovascular: See HPI:  GI: Negative; No nausea, vomiting, diarrhea, or abdominal pain GU: Negative; No dysuria, hematuria, or difficulty voiding Musculoskeletal: Negative; no myalgias, joint pain, or weakness Hematologic: Negative; no easy bruising, bleeding Endocrine: Negative; no heat/cold intolerance; no diabetes, Neuro: Negative; no changes in balance, headaches Skin: Negative; No rashes or skin lesions Psychiatric: Negative; No behavioral problems, depression Sleep: Negative; No snoring,  daytime sleepiness, hypersomnolence, bruxism, restless legs, hypnogognic hallucinations. Other comprehensive 14 point system review is negative   Physical Exam BP 110/70 mmHg  Pulse 65  Ht 4' 11" (1.499 m)  Wt 132 lb 14.4 oz (60.283 kg)  BMI 26.83 kg/m2  LMP 12/21/1999    Wt Readings from Last 3 Encounters:  04/28/15 132 lb 14.4 oz (60.283 kg)  03/05/15 133 lb (60.328 kg)  02/24/15 132 lb   1.6 oz (59.92 kg)   General: Alert, oriented, no distress.  Skin: normal turgor, no rashes, warm and dry HEENT: Normocephalic, atraumatic. Pupils equal round and reactive to light; sclera anicteric; extraocular muscles intact, No lid lag; Nose without nasal septal hypertrophy; Mouth/Parynx benign; Mallinpatti scale 3 Neck: No JVD, no carotid bruits; normal carotid upstroke Lungs: clear to ausculatation and percussion bilaterally; no wheezing or rales, normal inspiratory and expiratory effort Chest wall: without tenderness to palpitation Heart: PMI not displaced, RRR, s1 s2 normal, 1/6 systolic murmur, No diastolic murmur, no rubs, gallops, thrills, or heaves Abdomen: soft, nontender; no hepatosplenomehaly, BS+; abdominal aorta nontender and not dilated by palpation. Back: no CVA tenderness Pulses: 2+; Musculoskeletal: full range of motion, normal strength, no joint deformities Extremities: Pulses 2+, no clubbing cyanosis or edema, Homan's sign negative  Neurologic: grossly nonfocal; Cranial nerves grossly wnl Psychologic: Normal mood and affect  ECG (independently read by me):  Normal sinus rhythm at 65 bpm. Right bundle branch block with repolarization changes.  March 2016ECG (independently read by me): Normal sinus rhythm at 68 bpm.  No ectopy.  No signal been ST segment changes.  December 2015 ECG (independently read by me): Normal sinus rhythm at 77 bpm.  Normal intervals.  No significant ST changes.  November 2015 ECG (independently read by me):sinus rhythm at 88 bpm.  QTc interval 454 ms.  No significant ST-T changes.  LABS:  BMET  BMP Latest Ref Rng 02/19/2015 11/05/2014 11/05/2014  Glucose 70 - 99 mg/dL 93 - 116(H)  BUN 6 - 23 mg/dL 10 - 8  Creatinine 0.50 - 1.10 mg/dL 0.76 0.54 0.56  Sodium 135 - 145 mEq/L 140 - 139  Potassium 3.5 - 5.3 mEq/L 4.1 -  3.3(L)  Chloride 96 - 112 mEq/L 104 - 100  CO2 19 - 32 mEq/L 25 - 23  Calcium 8.4 - 10.5 mg/dL 9.6 - 9.6     Hepatic Function Panel   Hepatic Function Latest Ref Rng 02/19/2015 11/05/2014 10/24/2014  Total Protein 6.0 - 8.3 g/dL 7.5 7.7 7.0  Albumin 3.5 - 5.2 g/dL 4.2 3.8 3.9  AST 0 - 37 U/L 30 41(H) 18  ALT 0 - 35 U/L 28 49(H) 20  Alk Phosphatase 39 - 117 U/L 65 70 63  Total Bilirubin 0.2 - 1.2 mg/dL 0.8 0.5 0.6  Bilirubin, Direct 0.0 - 0.3 mg/dL - - -     CBC  CBC Latest Ref Rng 11/05/2014 11/05/2014 10/19/2014  WBC 4.0 - 10.5 K/uL 9.0 6.7 8.2  Hemoglobin 12.0 - 15.0 g/dL 14.2 13.2 11.8(L)  Hematocrit 36.0 - 46.0 % 41.3 38.5 35.1(L)  Platelets 150 - 400 K/uL 551(H) 541(H) 335     BNP    Component Value Date/Time   PROBNP 8.0 10/05/2006 1022    Lipid Panel     Component Value Date/Time   CHOL 159 02/19/2015 0912   TRIG 95 02/19/2015 0912   TRIG 85 10/05/2006 1022   HDL 70 02/19/2015 0912   CHOLHDL 2.3 02/19/2015 0912   CHOLHDL 4.3 CALC 10/05/2006 1022   VLDL 19 02/19/2015 0912   LDLCALC 70 02/19/2015 0912   Lipid Panel     RADIOLOGY: No results found.    ASSESSMENT AND PLAN: Mrs. Caya Soberanis is a 74 year old African-American female who suffered a NSTEMI  in October 2015 which was felt to be due to subtotal high-grade calcified RCA with severe diffuse distal disease beyond her subtotal stenosis.  She also has a severely diffusely diseased mid distal  LAD which is not amenable for revascularization.  At the time of her intervention, the smallest balloon catheter that was available in the catheterization laboratory was a 1.20 and this was unable to cross the stenosis.  The catheterization laboratory now has the switch catheter which can go down to 0.75 mm, and if the patient develops increasing recurrent symptomatology another attempt at repeat intervention may be considered.    When I had last seen her, she had experienced an episode of chest pain at night,  which was nitrate responsive. Since the addition of isosorbide 30 mg at bedtime was added to her 90 mg morning dose. She has not had any recurrent symptomatology.  She continues to be on maximal dose Ranexa 1000 g twice a day.  Her QTc interval is 455.  There is a borderline right bundle branch block.  Her blood pressure today is stable at 110/70.  Her weight is stabe at 132 pounds.  She is not having any recurrent anginal symptomatology or significant wheezing and is tolerating amlodipine 10 mg, bisoprolol 5 mg, in addition to her nitrate and ranolazine regimen. She is tolerating maximal dose atorvastatin at 80 mg and denies myalgias.  LDL is at target at 70.  She will continue her current regimen.  She is not wheezing on her Symbicort, QVAR in addition to Singulair treatment.  As long as she remains stable  I will see her in 6 months for reevaluation but  am available sooner if recurrent symptomatology develops.   Time spent: 25 minutes  Thomas A. Kelly, MD, FACC  04/28/2015 10:48 AM    

## 2015-04-28 NOTE — Patient Instructions (Signed)
Your physician wants you to follow-up in: 6 Months You will receive a reminder letter in the mail two months in advance. If you don't receive a letter, please call our office to schedule the follow-up appointment.  

## 2015-04-29 NOTE — Addendum Note (Signed)
Addended by: Diana Eves on: 04/29/2015 02:27 PM   Modules accepted: Orders

## 2015-05-17 ENCOUNTER — Other Ambulatory Visit: Payer: Self-pay | Admitting: Internal Medicine

## 2015-05-20 ENCOUNTER — Encounter: Payer: Self-pay | Admitting: Internal Medicine

## 2015-05-20 ENCOUNTER — Ambulatory Visit (INDEPENDENT_AMBULATORY_CARE_PROVIDER_SITE_OTHER): Payer: Medicare Other | Admitting: Internal Medicine

## 2015-05-20 VITALS — BP 104/70 | HR 73 | Ht 59.0 in | Wt 133.4 lb

## 2015-05-20 DIAGNOSIS — I10 Essential (primary) hypertension: Secondary | ICD-10-CM | POA: Diagnosis not present

## 2015-05-20 DIAGNOSIS — E785 Hyperlipidemia, unspecified: Secondary | ICD-10-CM | POA: Diagnosis not present

## 2015-05-20 DIAGNOSIS — I2510 Atherosclerotic heart disease of native coronary artery without angina pectoris: Secondary | ICD-10-CM | POA: Diagnosis not present

## 2015-05-20 DIAGNOSIS — Z23 Encounter for immunization: Secondary | ICD-10-CM | POA: Diagnosis not present

## 2015-05-20 DIAGNOSIS — J4540 Moderate persistent asthma, uncomplicated: Secondary | ICD-10-CM

## 2015-05-20 DIAGNOSIS — I2584 Coronary atherosclerosis due to calcified coronary lesion: Secondary | ICD-10-CM | POA: Diagnosis not present

## 2015-05-20 MED ORDER — FLUTICASONE PROPIONATE HFA 110 MCG/ACT IN AERO: 2.0000 | INHALATION_SPRAY | Freq: Two times a day (BID) | RESPIRATORY_TRACT | Status: DC

## 2015-05-20 NOTE — Patient Instructions (Addendum)
Try lipitor 80 mg one half in pm for next few weeks and let Dr Claiborne Billings know if cramping goes away  Change the flovent to the inhaler form =  hfa 110 Take 2 puffs first thing in am and then another 2 puffs about 12 hours later.   Tdap today  See calendar for specific medication instructions and bring it back for each and every office visit for every healthcare provider you see.  Without it,  you may not receive the best quality medical care that we feel you deserve.  You will note that the calendar groups together  your maintenance  medications that are timed at particular times of the day.  Think of this as your checklist for what your doctor has instructed you to do until your next evaluation to see what benefit  there is  to staying on a consistent group of medications intended to keep you well.  The other group at the bottom is entirely up to you to use as you see fit  for specific symptoms that may arise between visits that require you to treat them on an as needed basis.  Think of this as your action plan or "what if" list.   Separating the top medications from the bottom group is fundamental to providing you adequate care going forward.    See Tammy in 3  Months and she will give new calendar then

## 2015-05-20 NOTE — Assessment & Plan Note (Signed)
(  [name] not active)  -Xolair rx  8/05 > 10/2006 -Mastered HFA December 30, 2008  - IgE  296 11/20/2013  - Prevnar rx 05/07/14     I had an extended discussion with the patient reviewing all relevant studies completed to date and  lasting 15 to 20 minutes of a 25 minute visit on the following ongoing concerns:   All goals of chronic asthma control met including optimal function and elimination of symptoms with minimal need for rescue therapy.  Contingencies discussed in full including contacting this office immediately if not controlling the symptoms using the rule of two's.      flovent dpi ok but powder irritating her mouth > rec change to flovent 110 2bid as originally rx'd    Each maintenance medication was reviewed in detail including most importantly the difference between maintenance and as needed and under what circumstances the prns are to be used. This was done in the context of a medication calendar review which provided the patient with a user-friendly unambiguous mechanism for medication administration and reconciliation and provides an action plan for all active problems. It is critical that this be shown to every doctor  for modification during the office visit if necessary so the patient can use it as a working document.

## 2015-05-20 NOTE — Assessment & Plan Note (Signed)
Doing great on bisoprolol, no change needed

## 2015-05-20 NOTE — Progress Notes (Signed)
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 Follow in pulmonary clinic also for primary care with hbp/ hyperlipidemia complicated by MI 65/03/54 and persistent mucus plug in RML/ RLLL s/p fob 02/05/15 with marked improvement p lavage and no endobrchial lesions, just diffuse airway swelling      History of Present Illness   10/14/13 [name] > laser rx for back pain  rad legs > resolved    Admit Date: 10/15/2014 Discharge date: 10/19/2014    PRIMARY DISCHARGE DIAGNOSIS:  NSTEMI (non-ST elevated myocardial infarction)   Essential hypertension  GERD (gastroesophageal reflux disease)  Chest pain  Hyperlipidemia  Hypertension  CAD (coronary artery disease)  11/28/2014 acute ov/[name] re: asthma flare despite qvar and symbicort max doses  Chief Complaint  Patient presents with  . Acute Visit    Pt c/o increased wheezing over the past month- esp worse at night.    Wheezing worse at 3 am, sits up and better p saba  > back to sleep,  Onset was indolent and increased as lopressor dose was increased  >>d/c lopressor , rx bisoprolol , pred taper   01/28/2015 Follow up and Med Review    Patient returns for 6 week follow-up and medication review We reviewed all her medications organized them into a medication calendar with patient education . Pt has had 2 hospitalization over last 4 months with NSTEMI in  Oct and HCAP in Nov.  She is starting to feel better , getting back to her baseline.  Last office visit was changed from Lopressor to bisoprolol. Due to wheezing Patient says that she is improved with resolution of wheezing. Chest x-ray last visit showed a persistent right middle and right lower lobe infiltrate. She denies any hemoptysis, unintentional  weight loss, chest pain, orthopnea, PND, leg swelling, or syncope. She is being followed by cardiology for her underlying coronary artery disease. rec Fob next step > 02/05/15 copious thick plugs/ pseudomonas sensitive to cipro rx  750 bid x 10d     02/18/2015 f/u  office visit/ [name]  Re abpa with mucus plugs Chief Complaint  Patient presents with  . Follow-up    CXR was done today. Pt states that her breathing is doing well. She rarely uses rescue inhaler. No new co's today.    rec For cough/ congestion use mucinex dm and the flutter valve as much as possible     05/20/2015 f/u ov/[name] re:  ABPA/ hbp/ hyperlipidemia  Chief Complaint  Patient presents with  . Follow-up    Pt states that her breathing is doing well.  She has not been bothered by cough. No new co's today.   no rescue/ on symbicort 160 one twice daily and flovent 110 Take 2 puffs first thing in am and then another 2 puffs about 12 hours later.  No need for saba at all / doesn't like the powder form which is not what was ordered  Muscle cramps back of legs when bends over ever since increased lipitor to 80 mg per Dr Claiborne Billings      No obvious day to day or daytime variabilty or assoc chronic cough or cp or chest tightness, subjective wheeze overt sinus or hb symptoms. No unusual exp hx or h/o  childhood pna/ asthma or knowledge of premature birth.  Sleeping ok without nocturnal  or early am exacerbation  of respiratory  c/o's or need for noct saba. Also denies any obvious fluctuation of symptoms with weather or environmental changes or other aggravating or alleviating factors except as outlined above   Current Medications, Allergies, Complete Past Medical History, Past Surgical History, Family History, and Social History were reviewed in [employer] record.  ROS  The following are not active complaints unless bolded sore throat, dysphagia, dental problems, itching, sneezing,  nasal congestion or excess/  purulent secretions, ear ache,   fever, chills, sweats, unintended wt loss, pleuritic or exertional cp, hemoptysis,  orthopnea pnd or leg swelling, presyncope, palpitations, heartburn, abdominal pain, anorexia, nausea, vomiting, diarrhea  or change in bowel or urinary habits, change in stools or urine, dysuria,hematuria,  rash, arthralgias, visual complaints, headache, numbness weakness or ataxia or problems with walking or coordination,  change in mood/affect or memory.                    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 [place]...........................................................................Marland KitchenWert  - Td 05/20/2015  - Pneumovax 09/2003 and October 27, 2009  Prevnar 05/07/2014  - Colonscopy 10/28/2004  - CPX 11/19/13     - GYN per [name]'s office -Mammogram 2013 , 2014  MED CALENDAR 05/05/11 , 05/16/2013 , 08/06/2014 , 01/28/2015         Objective:   Physical Exam  Wt 143 11/09/2011 >  04/12/2012  140 > 06/26/2012  139 >  11/13/2012 135 >  137 02/12/2013 >141 05/16/2013 > 07/31/2013  139 >  11/19/2013  134 >135 02/18/2014 > 04/16/2014 134 > 05/07/2014  134 >131 08/06/2014 > 10/30/2014 130 > 11/28/2014 128 > 02/18/2015   133 > 05/20/2015 133  Pleasant amb bf nad  HEENT: top dentures, nl turbinates, and orophanx. Nl external ear canals without cough reflex   NECK :  without JVD/Nodes/TM/ nl carotid upstrokes  bilaterally   LUNGS: no acc muscle use, CTA w/ no wheezing    CV:  RRR  no s3 or murmur or increase in P2, no edema   ABD:  soft and nontender with nl excursion in the supine position. No bruits or organomegaly, bowel sounds nl  MS:  warm without deformities, calf tenderness, cyanosis or clubbing  SKIN: warm and dry without lesions    NEURO:  alert, approp, no deficits on motor/cerbellar testing, nl reflexes/ no nystagmus     CXR PA and Lateral:   02/18/2015 :     I personally reviewed images and agree with radiology impression as follows:   No acute cardiopulmonary disease. Right base pleural parenchymal scarring.    Assessment & Plan:

## 2015-05-20 NOTE — Assessment & Plan Note (Signed)
IHD > target LDL < 70   F/u by Dr Claiborne Billings    Also pos fm hx, hbp  - 05/20/2015 >  muscle cramping on lipitor 80 with LDL 70 so try lipitor 40 to see if that's the problem

## 2015-06-16 ENCOUNTER — Telehealth: Payer: Self-pay | Admitting: Cardiovascular Disease

## 2015-06-16 MED ORDER — RANOLAZINE ER 1000 MG PO TB12: 1000.0000 mg | ORAL_TABLET | Freq: Two times a day (BID) | ORAL | Status: DC

## 2015-06-16 MED ORDER — TICAGRELOR 90 MG PO TABS: 90.0000 mg | ORAL_TABLET | Freq: Two times a day (BID) | ORAL | Status: DC

## 2015-06-16 NOTE — Telephone Encounter (Signed)
Pt would like some samples of Brilinta and Ranexa please.

## 2015-06-16 NOTE — Telephone Encounter (Signed)
SPOKE TO PATIENT.  SHE IS INFORMED  SAMPLES ARE AVAILABLE.

## 2015-06-20 ENCOUNTER — Telehealth: Payer: Self-pay | Admitting: Cardiovascular Disease

## 2015-06-20 NOTE — Telephone Encounter (Signed)
Pt. Stated she started having welps when she started taking Ranexa , and that in between does the welps decrease and start back when she takes the second dose, I have talked to our [employer] here in the office and her suggestion was to stop taking this medication for 5 days to see if the welps decrease and if she develops cp to restart the med also is cp not relieved to go to the ER, also I informed the pt. To see her PCP Dr. Leonides Schanz for future advice and additional instruction. This may be a reaction to the brilinta , and this will be addressed with Dr. Claiborne Billings

## 2015-06-20 NOTE — Telephone Encounter (Signed)
Cassie Campbell is calling because the Ranexa is causing her break out in big whelps around her waist to the back and inner thighs . Please call   Thanks

## 2015-06-24 ENCOUNTER — Telehealth: Payer: Self-pay | Admitting: Cardiovascular Disease

## 2015-06-24 ENCOUNTER — Other Ambulatory Visit: Payer: Self-pay | Admitting: Obstetrics and Gynecology

## 2015-06-24 DIAGNOSIS — Z79899 Other long term (current) drug therapy: Secondary | ICD-10-CM

## 2015-06-24 DIAGNOSIS — Z7901 Long term (current) use of anticoagulants: Secondary | ICD-10-CM

## 2015-06-24 MED ORDER — CLOPIDOGREL BISULFATE 75 MG PO TABS: 75.0000 mg | ORAL_TABLET | Freq: Every day | ORAL | Status: DC

## 2015-06-24 NOTE — Telephone Encounter (Signed)
Mrs. Fedele is calling because two of her heart medication has broken her out in rash . The medications are Ranexa 1000mg  and the Brilinta 90mg  . Have been itching all weekend. Please call   Thanks

## 2015-06-24 NOTE — Telephone Encounter (Signed)
Dr. Gwenlyn Found advised patient to hold Brilinta for 1 week to allow wash out - no stents - start clopidogrel 75mg  once daily in 1 week (7/11) and have P2Y12 2 weeks after starting medication @ [facility] [place].   Patient advised to stay off Ranexa until able to assess if rash resolves and if so, if she is a responder to plavix.   Will route message to Dr. Claiborne Billings as Juluis Rainier   Letter with instructions mailed to patient.

## 2015-06-24 NOTE — Telephone Encounter (Signed)
Medication refill request: Triamcinolone 0.1 % Last AEX: 03/05/15 with BS Next AEX: 03/31/16 with BS Last MMG (if hormonal medication request): n/a Refill authorized: Please advise

## 2015-06-24 NOTE — Telephone Encounter (Signed)
Returned call to patient. She reports she thinks she is having allergic rxn to [place]. She has been on these medications since her MI October 2015 and had a little rash after this, but didn't attribute it to the medications, thus never notified MD of her concerns. She now has "fine bumps" all over the lower half of her body (waist, buttocks, inner thighs" and is itching a lot. She is allergic benadryl. Her OB gave her a cream to use to help the rash but it did nothing for her. She called on July 1 and was advised to STOP ranexa - she still has rash. She took last Brilinta yesterday July 4 @ 430pm.   Will defer to DOD, Dr. Gwenlyn Found to review and advise on medication change.

## 2015-06-25 ENCOUNTER — Telehealth: Payer: Self-pay | Admitting: Internal Medicine

## 2015-06-25 MED ORDER — PREDNISONE 10 MG PO TABS: ORAL_TABLET | ORAL | Status: DC

## 2015-06-25 MED ORDER — HYDROXYZINE HCL 25 MG PO TABS: ORAL_TABLET | ORAL | Status: DC

## 2015-06-25 NOTE — Telephone Encounter (Signed)
Patient says that she has a itchy, burning rash between thighs near groin and on back of thighs; Patient says that Cardiologist told her to stop taking the [place] and Ranexa.  She stopped taking Ranexa on Friday and the [place] yesterday. They are starting her on Plavix 75mg  daily, starting Monday.  The itching is so bad patient cannot sleep at night. Patient says that Cardiologist told her to take Benadryl, however, she cannot take Benadryl, she is allergic to it. She would like something called in for her to help with the rash and swelling.  [facility] [address].    Allergies  Allergen Reactions  . Codeine Phosphate Nausea Only  . Aspirin Swelling and Rash  . Brilinta [Ticagrelor] Rash    Started on Ranexa & Brilinta 09/2014 - rash - unsure which it is directly related to  . Diphenhydramine Hcl Rash  . Fish-Derived Products Swelling and Rash  . Penicillins Swelling and Rash

## 2015-06-25 NOTE — Telephone Encounter (Signed)
reviewed

## 2015-06-25 NOTE — Telephone Encounter (Signed)
Vistaril 25 mg 1-2 every 4 h prn and Prednisone 10 mg take  4 each am x 2 days,   2 each am x 2 days,  1 each am x 2 days and stop but need to see her 7/8 in the afternoon before closing if not a lot better by then

## 2015-06-25 NOTE — Telephone Encounter (Signed)
Pt aware of recs. RX sent in. Nothing further needed 

## 2015-07-09 ENCOUNTER — Ambulatory Visit: Payer: Medicare Other | Admitting: Internal Medicine

## 2015-07-14 ENCOUNTER — Ambulatory Visit (INDEPENDENT_AMBULATORY_CARE_PROVIDER_SITE_OTHER): Payer: Medicare Other | Admitting: Internal Medicine

## 2015-07-14 ENCOUNTER — Encounter: Payer: Self-pay | Admitting: Internal Medicine

## 2015-07-14 VITALS — BP 102/60 | HR 68 | Ht 59.0 in | Wt 134.8 lb

## 2015-07-14 DIAGNOSIS — B356 Tinea cruris: Secondary | ICD-10-CM

## 2015-07-14 DIAGNOSIS — I2584 Coronary atherosclerosis due to calcified coronary lesion: Secondary | ICD-10-CM

## 2015-07-14 DIAGNOSIS — I2510 Atherosclerotic heart disease of native coronary artery without angina pectoris: Secondary | ICD-10-CM | POA: Diagnosis not present

## 2015-07-14 DIAGNOSIS — J4540 Moderate persistent asthma, uncomplicated: Secondary | ICD-10-CM | POA: Diagnosis not present

## 2015-07-14 MED ORDER — CLOTRIMAZOLE 1 % EX CREA: 1.0000 "application " | TOPICAL_CREAM | Freq: Two times a day (BID) | CUTANEOUS | Status: DC

## 2015-07-14 NOTE — Patient Instructions (Addendum)
Clotrimazole cream twice daily for at least 2 weeks then as needed > if not improving see your dermatologist  See calendar for specific medication instructions and bring it back for each and every office visit for every healthcare provider you see.  Without it,  you may not receive the best quality medical care that we feel you deserve.  You will note that the calendar groups together  your maintenance  medications that are timed at particular times of the day.  Think of this as your checklist for what your doctor has instructed you to do until your next evaluation to see what benefit  there is  to staying on a consistent group of medications intended to keep you well.  The other group at the bottom is entirely up to you to use as you see fit  for specific symptoms that may arise between visits that require you to treat them on an as needed basis.  Think of this as your action plan or "what if" list.   Separating the top medications from the bottom group is fundamental to providing you adequate care going forward.

## 2015-07-14 NOTE — Progress Notes (Signed)
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 Follow in pulmonary clinic also for primary care with hbp/ hyperlipidemia complicated by MI 68/34/19 and persistent mucus plug in RML/ RLLL s/p fob 02/05/15 with marked improvement p lavage and no endobrchial lesions, just diffuse airway swelling      History of Present Illness   10/14/13 Cassie Campbell > laser rx for back pain  rad legs > resolved    Admit Date: 10/15/2014 Discharge date: 10/19/2014    PRIMARY DISCHARGE DIAGNOSIS:  NSTEMI (non-ST elevated myocardial infarction)   Essential hypertension  GERD (gastroesophageal reflux disease)  Chest pain  Hyperlipidemia  Hypertension  CAD (coronary artery disease)  11/28/2014 acute ov/Cassie Campbell re: asthma flare despite qvar and symbicort max doses  Chief Complaint  Patient presents with  . Acute Visit    Pt c/o increased wheezing over the past month- esp worse at night.    Wheezing worse at 3 am, sits up and better p saba  > back to sleep,  Onset was indolent and increased as lopressor dose was increased  >>d/c lopressor , rx bisoprolol , pred taper   01/28/2015 Follow up and Med Review    Patient returns for 6 week follow-up and medication review We reviewed all her medications organized them into a medication calendar with patient education . Pt has had 2 hospitalization over last 4 months with NSTEMI in  Oct and HCAP in Nov.  She is starting to feel better , getting back to her baseline.  Last office visit was changed from Lopressor to bisoprolol. Due to wheezing Patient says that she is improved with resolution of wheezing. Chest x-ray last visit showed a persistent right middle and right lower lobe infiltrate. She denies any hemoptysis, unintentional  weight loss, chest pain, orthopnea, PND, leg swelling, or syncope. She is being followed by cardiology for her underlying coronary artery disease. rec Fob next step > 02/05/15 copious thick plugs/ pseudomonas sensitive to cipro rx  750 bid x 10d          05/20/2015 f/u ov/Cassie Campbell re:  ABPA/ hbp/ hyperlipidemia  Chief Complaint  Patient presents with  . Follow-up    Pt states that her breathing is doing well.  She has not been bothered by cough. No new co's today.   no rescue/ on symbicort 160 one twice daily and flovent 110 Take 2 puffs first thing in am and then another 2 puffs about 12 hours later.  No need for saba at all / doesn't like the powder form which is not what was ordered  Muscle cramps back of legs when bends over ever since increased lipitor to 80 mg per Dr Claiborne Billings  rec Try lipitor 80 mg one half in pm for next few weeks and let Dr Claiborne Billings know if cramping goes away Change the flovent to the inhaler form =  hfa 110 Take 2 puffs first thing in am and then another 2 puffs about 12 hours later.  Tdap today See calendar for specific medication instructions aic symptoms that may arise between visits that require you to treat them on an as needed basis.  Think of this as your action plan or "what if" list.  See Tammy in 3  Months and  she will give new calendar then     07/14/2015 f/u ov/Cassie Campbell re: groin rash/ f/u asthma with elevated IgE Chief Complaint  Patient presents with  . Acute Visit    Pt c/o rash since 06/20/15- itching and painful in between her thighs and groin area. Pred called in on 06/25/15 and this helped minimally.    first started in oct 2015 indololent onset variable severity/ itchy/ always around the groin worse in March 2016 eval gyn rx with triamcinolone/nystatin> some  better then worse off it. Has clotrimazole rx prn per med calendar action plan but did not start it  Asthma well controlled s need for saba      No obvious day to day or daytime variabilty or assoc  sob/ chronic cough or cp or chest tightness, subjective wheeze overt sinus or hb symptoms. No unusual exp hx or h/o childhood pna/ asthma or knowledge of premature birth.  Sleeping ok without nocturnal  or early am exacerbation  of respiratory  c/o's or need for noct saba. Also denies any obvious fluctuation of symptoms with weather or environmental changes or other aggravating or alleviating factors except as outlined above   Current Medications, Allergies, Complete Past Medical History, Past Surgical History, Family History, and Social History were reviewed in [employer] record.  ROS  The following are not active complaints unless bolded sore throat, dysphagia, dental problems, itching, sneezing,  nasal congestion or excess/ purulent secretions, ear ache,   fever, chills, sweats, unintended wt loss, pleuritic or exertional cp, hemoptysis,  orthopnea pnd or leg swelling, presyncope, palpitations, heartburn, abdominal pain, anorexia, nausea, vomiting, diarrhea  or change in bowel or urinary habits, change in stools or urine, dysuria,hematuria, rash, arthralgias, visual complaints, headache, numbness weakness or ataxia or problems with walking or coordination,  change in mood/affect or memory.                    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 [place]...........................................................................Marland KitchenWert  - Td 05/20/2015  - Pneumovax 09/2003 and October 27, 2009  Prevnar 05/07/2014  - Colonscopy 10/28/2004  - CPX 11/19/13     - GYN per [name]'s office -Mammogram 2013 , 2014  MED CALENDAR 05/05/11 , 05/16/2013 , 08/06/2014 , 01/28/2015         Objective:   Physical Exam  Wt 143 11/09/2011 >  04/12/2012  140 > 06/26/2012  139 >  11/13/2012 135 >  137 02/12/2013 >141 05/16/2013 > 07/31/2013  139 >  11/19/2013  134 >135 02/18/2014 > 04/16/2014 134 > 05/07/2014  134 >131 08/06/2014 > 10/30/2014 130 > 11/28/2014 128 > 02/18/2015   133 > 05/20/2015 133 > 07/14/2015   135   Pleasant amb bf nad  HEENT: top dentures, nl turbinates, and orophanx. Nl external ear canals without cough reflex   NECK :  without JVD/Nodes/TM/ nl carotid upstrokes bilaterally   LUNGS: no acc muscle use, CTA w/  no wheezing    CV:  RRR  no s3 or murmur or increase in P2, no edema   ABD:  soft and nontender with nl excursion in the supine position. No bruits or organomegaly, bowel sounds nl  MS:  warm without deformities, calf tenderness, cyanosis or clubbing  SKIN: classic tinear cruris with sharp borders/no satellites and V shape   NEURO:  alert, approp, no deficits on motor/cerbellar testing, nl reflexes/ no nystagmus            Assessment & Plan:

## 2015-07-16 ENCOUNTER — Other Ambulatory Visit (HOSPITAL_COMMUNITY)
Admission: RE | Admit: 2015-07-16 | Discharge: 2015-07-16 | Disposition: A | Discharge status: Home / Self Care | Payer: Medicare Other | Source: Ambulatory Visit | Attending: Cardiovascular Disease | Admitting: Cardiovascular Disease

## 2015-07-16 ENCOUNTER — Inpatient Hospital Stay (HOSPITAL_COMMUNITY): Admission: RE | Admit: 2015-07-16 | Payer: Self-pay | Source: Ambulatory Visit | Admitting: Cardiovascular Disease

## 2015-07-16 DIAGNOSIS — I2510 Atherosclerotic heart disease of native coronary artery without angina pectoris: Secondary | ICD-10-CM | POA: Insufficient documentation

## 2015-07-16 LAB — PLATELET INHIBITION P2Y12: Platelet Function  P2Y12: 197 [PRU] (ref 194–418)

## 2015-07-20 ENCOUNTER — Encounter: Payer: Self-pay | Admitting: Internal Medicine

## 2015-07-20 ENCOUNTER — Other Ambulatory Visit: Payer: Self-pay | Admitting: Cardiovascular Disease

## 2015-07-20 DIAGNOSIS — B356 Tinea cruris: Secondary | ICD-10-CM | POA: Insufficient documentation

## 2015-07-20 NOTE — Assessment & Plan Note (Signed)
Classic pattern, some better with nystatin but this is not candidiasis > rec clotrimazole as per med calendar instructions

## 2015-07-20 NOTE — Assessment & Plan Note (Signed)
(  [name] not active)  -Xolair rx  8/05 > 10/2006 -Mastered HFA December 30, 2008  - IgE  296 11/20/2013  - Prevnar rx 05/07/14   All goals of chronic asthma control met including optimal function and elimination of symptoms with minimal need for rescue therapy.  Contingencies discussed in full including contacting this office immediately if not controlling the symptoms using the rule of two's.     I had an extended discussion with the patient reviewing all relevant studies completed to date and  lasting 15 to 20 minutes of a 25 minute visit      Each maintenance medication was reviewed in detail including most importantly the difference between maintenance and as needed and under what circumstances the prns are to be used. This was done in the context of a medication calendar review which provided the patient with a user-friendly unambiguous mechanism for medication administration and reconciliation and provides an action plan for all active problems. It is critical that this be shown to every doctor  for modification during the office visit if necessary so the patient can use it as a working document.

## 2015-07-21 NOTE — Telephone Encounter (Signed)
REFILL 

## 2015-08-05 ENCOUNTER — Other Ambulatory Visit: Payer: Self-pay | Admitting: Cardiovascular Disease

## 2015-08-06 NOTE — Telephone Encounter (Signed)
Rx(s) sent to pharmacy electronically.  

## 2015-08-20 ENCOUNTER — Encounter: Payer: Medicare Other | Admitting: Adult Health

## 2015-08-26 ENCOUNTER — Encounter: Payer: Self-pay | Admitting: Adult Health

## 2015-08-26 ENCOUNTER — Ambulatory Visit (INDEPENDENT_AMBULATORY_CARE_PROVIDER_SITE_OTHER): Payer: Medicare Other | Admitting: Adult Health

## 2015-08-26 VITALS — BP 114/72 | HR 72 | Temp 97.7°F | Ht 59.0 in | Wt 137.0 lb

## 2015-08-26 DIAGNOSIS — E785 Hyperlipidemia, unspecified: Secondary | ICD-10-CM

## 2015-08-26 DIAGNOSIS — I10 Essential (primary) hypertension: Secondary | ICD-10-CM

## 2015-08-26 DIAGNOSIS — B356 Tinea cruris: Secondary | ICD-10-CM

## 2015-08-26 DIAGNOSIS — J4540 Moderate persistent asthma, uncomplicated: Secondary | ICD-10-CM

## 2015-08-26 DIAGNOSIS — Z23 Encounter for immunization: Secondary | ICD-10-CM

## 2015-08-26 NOTE — Progress Notes (Signed)
Chart and office note reviewed in detail  > agree with a/p as outlined    

## 2015-08-26 NOTE — Assessment & Plan Note (Signed)
Doing on current regimen  No changes

## 2015-08-26 NOTE — Assessment & Plan Note (Signed)
Doing well on current regimen  Low fat cholesterol diet

## 2015-08-26 NOTE — Assessment & Plan Note (Signed)
Controlled on rx   

## 2015-08-26 NOTE — Patient Instructions (Addendum)
Flu shot today  Follow med calendar closely and bring to each visit.  Follow up in 3 month for physical , come fasting.  Continue on low fat cholesterol diet  Low salt diet .

## 2015-08-26 NOTE — Progress Notes (Signed)
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 Follow in pulmonary clinic also for primary care with hbp/ hyperlipidemia complicated by MI 68/34/19 and persistent mucus plug in RML/ RLLL s/p fob 02/05/15 with marked improvement p lavage and no endobrchial lesions, just diffuse airway swelling      History of Present Illness   10/14/13 [name] > laser rx for back pain  rad legs > resolved    Admit Date: 10/15/2014 Discharge date: 10/19/2014    PRIMARY DISCHARGE DIAGNOSIS:  NSTEMI (non-ST elevated myocardial infarction)   Essential hypertension  GERD (gastroesophageal reflux disease)  Chest pain  Hyperlipidemia  Hypertension  CAD (coronary artery disease)  11/28/2014 acute ov/[name] re: asthma flare despite qvar and symbicort max doses  Chief Complaint  Patient presents with  . Acute Visit    Pt c/o increased wheezing over the past month- esp worse at night.    Wheezing worse at 3 am, sits up and better p saba  > back to sleep,  Onset was indolent and increased as lopressor dose was increased  >>d/c lopressor , rx bisoprolol , pred taper   01/28/2015 Follow up and Med Review    Patient returns for 6 week follow-up and medication review We reviewed all her medications organized them into a medication calendar with patient education . Pt has had 2 hospitalization over last 4 months with NSTEMI in  Oct and HCAP in Nov.  She is starting to feel better , getting back to her baseline.  Last office visit was changed from Lopressor to bisoprolol. Due to wheezing Patient says that she is improved with resolution of wheezing. Chest x-ray last visit showed a persistent right middle and right lower lobe infiltrate. She denies any hemoptysis, unintentional  weight loss, chest pain, orthopnea, PND, leg swelling, or syncope. She is being followed by cardiology for her underlying coronary artery disease. rec Fob next step > 02/05/15 copious thick plugs/ pseudomonas sensitive to cipro rx  750 bid x 10d          05/20/2015 f/u ov/[name] re:  ABPA/ hbp/ hyperlipidemia  Chief Complaint  Patient presents with  . Follow-up    Pt states that her breathing is doing well.  She has not been bothered by cough. No new co's today.   no rescue/ on symbicort 160 one twice daily and flovent 110 Take 2 puffs first thing in am and then another 2 puffs about 12 hours later.  No need for saba at all / doesn't like the powder form which is not what was ordered  Muscle cramps back of legs when bends over ever since increased lipitor to 80 mg per Dr Claiborne Billings  rec Try lipitor 80 mg one half in pm for next few weeks and let Dr Claiborne Billings know if cramping goes away Change the flovent to the inhaler form =  hfa 110 Take 2 puffs first thing in am and then another 2 puffs about 12 hours later.  Tdap today See calendar for specific medication instructions aic symptoms that may arise between visits that require you to treat them on an as needed basis.  Think of this as your action plan or "what if" list.  See Tammy in 3  Months and  she will give new calendar then     07/14/2015 f/u ov/[name] re: groin rash/ f/u asthma with elevated IgE Chief Complaint  Patient presents with  . Acute Visit    Pt c/o rash since 06/20/15- itching and painful in between her thighs and groin area. Pred called in on 06/25/15 and this helped minimally.    first started in oct 2015 indololent onset variable severity/ itchy/ always around the groin worse in March 2016 eval gyn rx with triamcinolone/nystatin> some  better then worse off it. Has clotrimazole rx prn per med calendar action plan but did not start it  Asthma well controlled s need for saba  >>clotrimazole rx   08/26/2015 Follow up : Asthma w/ high  IgE/HTN/Hyperlipidemia  Pt returns for 2 month follow up and med review .  We reviewed all her meds and organized them into a med calendar with pt educaiton  Appears to be taking correctly.   Pt has chronic asthma, she is maintained on Symbicort and Flovent. Overall says she is doing well. no increased SABA use. Previously on QVAR but switched to flovent d/t insurance coverage.   Has known HTN and CAD . Follows with cards on reg basis . B/p well controlled.  Now on plavix daily . Denies chest pain or syncope.   Has hyperlidemia . Last lipid panel with good control on lipitor. Discussed low fat cholesterol diet and exercise. .   Would like to get flu shot today . PVX and Prevnar /DTAP utd. Discussed shingle vaccine in future    Had groin rash/candidia last ov , tx/ w/  Clotrimazole  W/ resolution .      Current Medications, Allergies, Complete Past Medical History, Past Surgical History, Family History, and Social History were reviewed in [employer] record.  ROS  The following are not active complaints unless bolded sore throat, dysphagia, dental problems, itching, sneezing,  nasal congestion or excess/ purulent secretions, ear ache,   fever, chills, sweats, unintended wt loss, pleuritic or exertional cp, hemoptysis,  orthopnea pnd or leg swelling, presyncope, palpitations, heartburn, abdominal pain, anorexia, nausea, vomiting, diarrhea  or change in bowel or urinary habits, change in stools or urine, dysuria,hematuria,  , arthralgias, visual complaints, headache, numbness weakness or ataxia or problems with walking or coordination,  change in mood/affect or memory.                    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)  OSTEOPENIA (ICD-733.90)  - DEXA 08/22/07 AP spine + 1.1, left femur -1.3, right femur -.8  - DEXA 10/06/09 + 1.6 left femur -1.6, right femur -.5   -DEXA 2014 no change  HYPERTENSION (ICD-401.9)  HYPERLIPIDEMIA  (ICD-272.4)  - Target < 130 ldl pos fm hx, hbp  OBESITY  - Target wt = 178 for BMI < 30  ASTHMA (ICD-493.90) ([name] not active)  -Xolair s 8/05 > 10/2006  -Mastered HFA December 30, 2008  ALLERGIC RHINITIS (ICD-477.9)  S/p cervical fusion 2000 ............................................................................ [place]...........................................................................Marland KitchenWert  - Td 05/20/2015  - Pneumovax 09/2003 and October 27, 2009  Prevnar 05/07/2014  - Colonscopy 10/28/2004  - CPX 11/19/13     - GYN per [name]'s office -Mammogram 2013 , 2014  MED CALENDAR 05/05/11 , 05/16/2013 , 08/06/2014 , 01/28/2015 , 08/26/2015        Objective:   Physical Exam  Wt 143 11/09/2011 >  04/12/2012  140 > 06/26/2012  139 >  11/13/2012 135 >  137 02/12/2013 >141 05/16/2013 > 07/31/2013  139 >  11/19/2013  134 >135 02/18/2014 > 04/16/2014 134 > 05/07/2014  134 >131 08/06/2014 > 10/30/2014 130 > 11/28/2014 128 > 02/18/2015   133 > 05/20/2015 133 > 07/14/2015   135 >137 08/26/2015   Pleasant amb bf nad  HEENT: top dentures, nl turbinates, and orophanx. Nl external ear canals without cough reflex   NECK :  without JVD/Nodes/TM/ nl carotid upstrokes bilaterally   LUNGS: no acc muscle use, CTA w/ no wheezing    CV:  RRR  no s3 or murmur or increase in P2, no edema   ABD:  soft and nontender with nl excursion in the supine position. No bruits or organomegaly, bowel sounds nl  MS:  warm without deformities, calf tenderness, cyanosis or clubbing  SKIN: clear w/o rash   NEURO:  alert, approp, no deficits on motor/cerbellar testing, nl reflexes/ no nystagmus            Assessment & Plan:

## 2015-08-26 NOTE — Assessment & Plan Note (Deleted)
Resolved w/ clotrimazole

## 2015-08-26 NOTE — Assessment & Plan Note (Signed)
Resolved w/ clotrimazole

## 2015-09-02 NOTE — Addendum Note (Signed)
Addended by: Osa Craver on: 09/02/2015 09:25 AM   Modules accepted: Orders, Medications

## 2015-10-18 ENCOUNTER — Other Ambulatory Visit: Payer: Self-pay | Admitting: Cardiovascular Disease

## 2015-10-27 ENCOUNTER — Ambulatory Visit (INDEPENDENT_AMBULATORY_CARE_PROVIDER_SITE_OTHER): Payer: Medicare Other | Admitting: Cardiovascular Disease

## 2015-10-27 VITALS — BP 130/70 | HR 70 | Ht 59.0 in | Wt 141.8 lb

## 2015-10-27 DIAGNOSIS — E785 Hyperlipidemia, unspecified: Secondary | ICD-10-CM | POA: Diagnosis not present

## 2015-10-27 DIAGNOSIS — Z79899 Other long term (current) drug therapy: Secondary | ICD-10-CM | POA: Diagnosis not present

## 2015-10-27 DIAGNOSIS — I214 Non-ST elevation (NSTEMI) myocardial infarction: Secondary | ICD-10-CM

## 2015-10-27 DIAGNOSIS — I25810 Atherosclerosis of coronary artery bypass graft(s) without angina pectoris: Secondary | ICD-10-CM

## 2015-10-27 DIAGNOSIS — I10 Essential (primary) hypertension: Secondary | ICD-10-CM

## 2015-10-27 DIAGNOSIS — I2510 Atherosclerotic heart disease of native coronary artery without angina pectoris: Secondary | ICD-10-CM

## 2015-10-27 DIAGNOSIS — R0789 Other chest pain: Secondary | ICD-10-CM

## 2015-10-27 MED ORDER — RANOLAZINE ER 500 MG PO TB12: 500.0000 mg | ORAL_TABLET | Freq: Two times a day (BID) | ORAL | Status: DC

## 2015-10-27 NOTE — Patient Instructions (Addendum)
Your physician recommends that you return for lab work TODAY.  Your physician wants you to follow-up in: 6 months or sooner if needed. You will receive a reminder letter in the mail two months in advance. If you don't receive a letter, please call our office to schedule the follow-up appointment.  Your physician has recommended you make the following change in your medication: restart ht e ranexa prescription. This has been sent to your pharmacy.   If you need a refill on your cardiac medications before your next appointment, please call your pharmacy.

## 2015-10-28 ENCOUNTER — Encounter: Payer: Self-pay | Admitting: Cardiovascular Disease

## 2015-10-28 NOTE — Progress Notes (Signed)
Patient ID: Cassie Campbell, female   DOB: 04/19/1941, 74 y.o.   MRN: [id number]     HPI: Cassie Campbell is a 74 y.o. female who presents for 6 month follow-up cardiology evaluation.    Cassie Campbell has a history of complex CAD and underwent catheterization by Dr. Fletcher Anon with class IV symptoms on 10/16/2014.  She was found to have diffusely diseased LAD, which was not suitable for revascularization.  She had ruled in for non-ST segment elevation myocardial infarction, felt to be due to a severely calcified almost subtotal RCA with severe diffuse calcified disease.  Initial attempt at PCI by Dr. Fletcher Anon was unsuccessful due to the severe calcification.  She was brought back to the laboratory the following day, and I performed a very long attempt at high-speed rotational atherectomy.  Unfortunately, due to the severely stenosed calcified lesion above the acute margin the Roto floppy wire was never be advanced distal enough due to severe disease beyond this site to allow the burr to be inserted to reach the stenosis.  Multiple attempts were made to utilize wire transfer, but even the smallest catheter was never able to pass the stenosis to allow for the Roto floppy wire to be reinserted in exchange for a Fielder XT wire, which was able to cross the stenosis and advanced distally.  Consequently, the procedure was aborted.  She was sent home on increased medication regimen with potential plans for possible follow-up evaluation depending upon symptom status.  Since hospital discharge, she has not experienced any significant recurrent anginal chest pain.  During the hospitalization ranolazine was added initially at 500 mg twice a day to isosorbide mononitrate, amlodipine, and beta blocker therapy.  She has been treated with aspirin and Brilinta for dual antiplatelet therapy.  When I saw her for subsequent evaluation I further titrated her ranolazine to 1000 mg twice a day and further titrated her Lopressor to 37.5  mg twice a day.  Her creatinine remained normal following her contrast load and she was mildly anemic.    She has a history of asthma and is on Symbicort 160-4.5 and Singulair.  She  has a history of hyperlipidemia, currently on Lipitor 80 mg.  She does have GERD for which he takes Pepcid as well as omeprazole.  She developed some increased wheezing on Lopressor and was changed to bisoprolol 5 mg in place of metoprolol.  She has seen Dr. Melvyn Novas for her severe chronic asthma and in the past.  She also was found to have marked atopy with significant elevation of IgG E levels.  She feels that her breathing has improved with the changed to bisoprolol from Lopressor.   When I last saw her in March, she had been remaining stable but the night before had experienced a 10-15 minute episode of chest pain during the evening which responded to nitroglycerin. When I saw her the following day she had been taking isosorbide 90 mg in the morning and I added isosorbide 30 mg at bedtime to her medical regimen of Ranexa 1000 mg twice a day , amlodipine 10 mg daily, and bisoprolol 5 mg. She denies recurrent chest pain symptomatology since the nocturnal dose of Imdur was added. She has been active.  Since I last saw her, she apparently developed a rash and at that time was advised to stop both Brilinta as well as Ranexa.  She was started on Plavix.  Her rash ultimately resolved.  Recently, she has noticed some episodes of sharp left-sided chest  discomfort below her breast which seem to occur in the sitting position and is not clearly exertionally precipitated.  She presents for evaluation.  Past Medical History  Diagnosis Date  . Benign positional vertigo   . Osteopenia     dexa 08/22/07 AP spine + 1.1, left femur -1.3, right femur -.8; dexa 10/06/09 +1.6, left femur =1.6, right femur -.  . Hypertension   . Hyperlipidemia     <130 ldl pos fm hx, bp  . Obesity   . Asthma     xolair s 8/05 ?11/07; mastered hfa 12/20/08  .  Allergic rhinitis   . Ruptured disc, cervical   . Depression   . PONV (postoperative nausea and vomiting)   . CAD (coronary artery disease)     a. 09/2014 NSTEMI s/p LHC with sig 2V dz. dLAD diffusely diseased and not suitable for PCI. unsuccessful RCA PCI d/t heavy calcifications  . Heart attack ([place])     09/2014    Past Surgical History  Procedure Laterality Date  . Cervical disc surgery  12/01  . Vein ligation and stripping    . Vulva /perineum biopsy  12-30-10    --epidermoid cyst  . Breast surgery  10-26-10    Rt. breast bx--for microcalcifications in rt. retroareolar region--dx was hyalinized fibroadenoma  . Bunionectomy Bilateral   . Left heart catheterization with coronary angiogram N/A 10/16/2014    Procedure: LEFT HEART CATHETERIZATION WITH CORONARY ANGIOGRAM;  Surgeon: Blane Ohara, MD;  Location: [facility] CATH LAB;  Service: Cardiovascular;  Laterality: N/A;  . Percutaneous coronary rotoblator intervention (pci-r) N/A 10/17/2014    Procedure: PERCUTANEOUS CORONARY ROTOBLATOR INTERVENTION (PCI-R);  Surgeon: Troy Sine, MD;  Location: [facility] C[facility] LAB;  Service: Cardiovascular;  Laterality: N/A;  . Video bronchoscopy Bilateral 02/05/2015    Procedure: VIDEO BRONCHOSCOPY WITHOUT FLUORO;  Surgeon: Tanda Rockers, MD;  Location: [facility] ENDOSCOPY;  Service: Endoscopy;  Laterality: Bilateral;    Allergies  Allergen Reactions  . Codeine Phosphate Nausea Only  . Aspirin Swelling and Rash  . Brilinta [Ticagrelor] Rash    Started on Ranexa & Brilinta 09/2014 - rash - unsure which it is directly related to  . Diphenhydramine Hcl Rash  . Fish-Derived Products Swelling and Rash  . Penicillins Swelling and Rash    Current Outpatient Prescriptions  Medication Sig Dispense Refill  . albuterol (PROAIR HFA) 108 (90 BASE) MCG/ACT inhaler 1-2 every 4-6 hours as needed for wheezing    . amLODipine (NORVASC) 10 MG tablet take 1 tablet by mouth once daily (Patient taking differently: take 1 tablet  by mouth every morning) 90 tablet 1  . atorvastatin (LIPITOR) 80 MG tablet Take 1 tablet (80 mg total) by mouth daily at 6 PM. NEED OV. 30 tablet 2  . bisoprolol (ZEBETA) 5 MG tablet Take 1 tablet by mouth every morning.   1  . Calcium Carbonate-Vit D-Min 1200-1000 MG-UNIT CHEW Chew 1 tablet by mouth every evening.     . cetirizine (ZYRTEC) 10 MG tablet Take 10 mg by mouth at bedtime as needed (itching and sneezing).     . clopidogrel (PLAVIX) 75 MG tablet take 1 tablet by mouth once daily 30 tablet 1  . clotrimazole (LOTRIMIN) 1 % cream Apply 1 application topically 2 (two) times daily. (Patient taking differently: Use as directed for rash) 60 g 11  . clotrimazole-betamethasone (LOTRISONE) cream Apply 1 application topically 2 (two) times daily as needed (itching).     Marland Kitchen dextromethorphan-guaiFENesin (Rupert DM) 30-600 MG  per 12 hr tablet Take 1-2 tablets by mouth every 12 (twelve) hours as needed (cough).     . famotidine (PEPCID) 20 MG tablet Take 20 mg by mouth at bedtime.     . fluticasone (FLONASE) 50 MCG/ACT nasal spray Place 2 sprays into both nostrils 2 (two) times daily.    . fluticasone (FLOVENT HFA) 110 MCG/ACT inhaler Inhale 2 puffs into the lungs 2 (two) times daily. 1 Inhaler 11  . isosorbide mononitrate (IMDUR) 60 MG 24 hr tablet Take 1.5 tablets (73m) by mouth in the morning and 0.5 (380m tablet in the evening. 180 tablet 9  . meclizine (ANTIVERT) 25 MG tablet TAKE 2 TABLETS BY MOUTH  3 TIMES DAILY AS NEEDED (Patient taking differently: TAKE 1- 2 TABLETS BY MOUTH  EVERY 8 HOURS AS NEEDED FOR DIZZINESS) 24 tablet 5  . montelukast (SINGULAIR) 10 MG tablet Take 10 mg by mouth at bedtime.    . Multiple Vitamin (MULTIVITAMIN) capsule Take 1 capsule by mouth every morning.     . Marland Kitchenmeprazole (PRILOSEC) 20 MG capsule Take 20 mg by mouth daily before breakfast.     . oxymetazoline (AFRIN) 0.05 % nasal spray 2 puffs every 12 hours for 5 days as needed for stuffy nose and sinus problems    .  sodium chloride (OCEAN) 0.65 % nasal spray Place 2 sprays into the nose every 4 (four) hours as needed (nasal congestion).     . traMADol (ULTRAM) 50 MG tablet Take 50 mg by mouth every 4 (four) hours as needed (severe cough or pain).    . budesonide-formoterol (SYMBICORT) 160-4.5 MCG/ACT inhaler Inhale 2 puffs into the lungs 2 (two) times daily. 1 Inhaler 0  . ranolazine (RANEXA) 500 MG 12 hr tablet Take 1 tablet (500 mg total) by mouth 2 (two) times daily. 60 tablet 6   No current facility-administered medications for this visit.    Social History   Social History  . Marital Status: Married    Spouse Name: N/A  . Number of Children: N/A  . Years of Education: N/A   Occupational History  . retired [employer] manager  Social History Main Topics  . Smoking status: Never Smoker   . Smokeless tobacco: Never Used  . Alcohol Use: No  . Drug Use: No  . Sexual Activity: No   Other Topics Concern  . Not on file   Social History Narrative    Family History  Problem Relation Age of Onset  . Diabetes Mother     AODM  . Stroke Mother   . Hypertension Mother   . Heart disease Father   . Breast cancer Daughter 36    dec--mets to liver/spine  . Diabetes Sister     AODM  . Breast cancer Sister   . Hypertension Sister     ROS General: Negative; No fevers, chills, or night sweats HEENT: Negative; No changes in vision or hearing, sinus congestion, difficulty swallowing Pulmonary: positive for asthma; No cough, wheezing, shortness of breath, hemoptysis Cardiovascular: See HPI:  GI: Negative; No nausea, vomiting, diarrhea, or abdominal pain GU: Negative; No dysuria, hematuria, or difficulty voiding Musculoskeletal: Negative; no myalgias, joint pain, or weakness Hematologic: Negative; no easy bruising, bleeding Endocrine: Negative; no heat/cold intolerance; no diabetes, Neuro: Negative; no changes in balance, headaches Skin: Negative; No rashes or skin lesions Psychiatric:  Negative; No behavioral problems, depression Sleep: Negative; No snoring,  daytime sleepiness, hypersomnolence, bruxism, restless legs, hypnogognic hallucinations. Other comprehensive 14 point system review  is negative   Physical Exam BP 130/70 mmHg  Pulse 70  Ht _0  (1.499 m)  Wt 141 lb 12.8 oz (64.32 kg)  BMI 28.62 kg/m2  LMP 12/21/1999   Wt Readings from Last 3 Encounters:  10/27/15 141 lb 12.8 oz (64.32 kg)  08/26/15 137 lb (62.143 kg)  07/14/15 134 lb 12.8 oz (61.145 kg)   General: Alert, oriented, no distress.  Skin: normal turgor, no rashes, warm and dry HEENT: Normocephalic, atraumatic. Pupils equal round and reactive to light; sclera anicteric; extraocular muscles intact, No lid lag; Nose without nasal septal hypertrophy; Mouth/Parynx benign; Mallinpatti scale 3 Neck: No JVD, no carotid bruits; normal carotid upstroke Lungs: clear to ausculatation and percussion bilaterally; no wheezing or rales, normal inspiratory and expiratory effort Chest wall: without tenderness to palpitation Heart: PMI not displaced, RRR, s1 s2 normal, 1/6 systolic murmur, No diastolic murmur, no rubs, gallops, thrills, or heaves Abdomen: soft, nontender; no hepatosplenomehaly, BS+; abdominal aorta nontender and not dilated by palpation. Back: no CVA tenderness Pulses: 2+; Musculoskeletal: full range of motion, normal strength, no joint deformities Extremities: Pulses 2+, no clubbing cyanosis or edema, Homan's sign negative  Neurologic: grossly nonfocal; Cranial nerves grossly wnl Psychologic: Normal mood and affect  ECG (independently read by me): Normal sinus rhythm at 70 bpm.  Right bundle-branch block with repolarization changes.  May 2016 ECG (independently read by me):  Normal sinus rhythm at 65 bpm. Right bundle branch block with repolarization changes.  March 2016 ECG (independently read by me): Normal sinus rhythm at 68 bpm.  No ectopy.  No signal been ST segment changes.  December  2015 ECG (independently read by me): Normal sinus rhythm at 77 bpm.  Normal intervals.  No significant ST changes.  November 2015 ECG (independently read by me):sinus rhythm at 88 bpm.  QTc interval 454 ms.  No significant ST-T changes.  LABS:  BMP Latest Ref Rng 02/19/2015 11/05/2014 11/05/2014  Glucose 70 - 99 mg/dL 93 - 116(H)  BUN 6 - 23 mg/dL 10 - 8  Creatinine 0.50 - 1.10 mg/dL 0.76 0.54 0.56  Sodium 135 - 145 mEq/L 140 - 139  Potassium 3.5 - 5.3 mEq/L 4.1 - 3.3(L)  Chloride 96 - 112 mEq/L 104 - 100  CO2 19 - 32 mEq/L 25 - 23  Calcium 8.4 - 10.5 mg/dL 9.6 - 9.6    Hepatic Function Latest Ref Rng 02/19/2015 11/05/2014 10/24/2014  Total Protein 6.0 - 8.3 g/dL 7.5 7.7 7.0  Albumin 3.5 - 5.2 g/dL 4.2 3.8 3.9  AST 0 - 37 U/L 30 41(H) 18  ALT 0 - 35 U/L 28 49(H) 20  Alk Phosphatase 39 - 117 U/L 65 70 63  Total Bilirubin 0.2 - 1.2 mg/dL 0.8 0.5 0.6  Bilirubin, Direct 0.0 - 0.3 mg/dL - - -    CBC Latest Ref Rng 11/05/2014 11/05/2014 10/19/2014  WBC 4.0 - 10.5 K/uL 9.0 6.7 8.2  Hemoglobin 12.0 - 15.0 g/dL 14.2 13.2 11.8(L)  Hematocrit 36.0 - 46.0 % 41.3 38.5 35.1(L)  Platelets 150 - 400 K/uL 551(H) 541(H) 335   Lab Results  Component Value Date   MCV 89.0 11/05/2014   MCV 88.9 11/05/2014   MCV 86.9 10/19/2014   Lab Results  Component Value Date   TSH 1.24 11/19/2013  No results found for: HGBA1C  Lipid Panel     Component Value Date/Time   CHOL 159 02/19/2015 0912   TRIG 95 02/19/2015 0912   TRIG 85 10/05/2006 1022  HDL 70 02/19/2015 0912   CHOLHDL 2.3 02/19/2015 0912   CHOLHDL 4.3 CALC 10/05/2006 1022   VLDL 19 02/19/2015 0912   LDLCALC 70 02/19/2015 0912   RADIOLOGY: No results found.    ASSESSMENT AND PLAN: Mrs. Sarahanne Novakowski is a 74 year old African-American female who suffered a NSTEMI  in October 2015 which was felt to be due to subtotal high-grade calcified RCA with severe diffuse distal disease beyond her subtotal stenosis.  She also has a severely  diffusely diseased mid distal LAD which is not amenable for revascularization.  At the time of her intervention, the smallest balloon catheter that was available in the catheterization laboratory was a 1.20 and this was unable to cross the stenosis.  There is now a switch catheter which can go down to 0.75 mm, and if the patient develops increasing recurrent symptomatology another attempt at repeat intervention may be considered.   When I had seen her recently, I had further titrated her nitrate therapy.  Since I last saw her, she had developed a rash which I suspect is most likely due to Brilinta and not due to Ranexa.  She now is on aspirin and Plavix for dual antiplatelet therapy.  She has experienced some sharp nonexertional chest pain below her left breast which is nonexertional and I suspect is non-ischemic chest pain.  However, with her significant CAD, I feel it is worth reinstituting Ranexa therapy.  I will rechallenge her with samples of 500 mg twice a day to see if she tolerates this without rash development.  Fasting laboratory will be obtained.  As long as she remains stable, I will see her in 6 months for reevaluation or sooner if recurrent problems arise.  Time spent: 25 minutes  Troy Sine, MD, [facility]  10/28/2015 1:59 PM

## 2015-10-31 LAB — COMPREHENSIVE METABOLIC PANEL
ALK PHOS: 86 U/L (ref 33–130)
ALT: 34 U/L — ABNORMAL HIGH (ref 6–29)
AST: 32 U/L (ref 10–35)
Albumin: 4.2 g/dL (ref 3.6–5.1)
BUN: 10 mg/dL (ref 7–25)
CHLORIDE: 102 mmol/L (ref 98–110)
CO2: 26 mmol/L (ref 20–31)
Calcium: 9.8 mg/dL (ref 8.6–10.4)
Creat: 0.65 mg/dL (ref 0.60–0.93)
Glucose, Bld: 83 mg/dL (ref 65–99)
Potassium: 4.2 mmol/L (ref 3.5–5.3)
SODIUM: 136 mmol/L (ref 135–146)
Total Bilirubin: 0.8 mg/dL (ref 0.2–1.2)
Total Protein: 7.5 g/dL (ref 6.1–8.1)

## 2015-10-31 LAB — TSH: TSH: 1.18 u[IU]/mL (ref 0.350–4.500)

## 2015-10-31 LAB — CBC
HCT: 40.4 % (ref 36.0–46.0)
Hemoglobin: 13.5 g/dL (ref 12.0–15.0)
MCH: 28.7 pg (ref 26.0–34.0)
MCHC: 33.4 g/dL (ref 30.0–36.0)
MCV: 85.8 fL (ref 78.0–100.0)
MPV: 8.8 fL (ref 8.6–12.4)
PLATELETS: 438 10*3/uL — AB (ref 150–400)
RBC: 4.71 MIL/uL (ref 3.87–5.11)
RDW: 15 % (ref 11.5–15.5)
WBC: 6.1 10*3/uL (ref 4.0–10.5)

## 2015-10-31 LAB — LIPID PANEL
CHOL/HDL RATIO: 3.3 ratio (ref <=5.0)
Cholesterol: 182 mg/dL (ref 125–200)
HDL: 56 mg/dL (ref >=46)
LDL Cholesterol: 103 mg/dL (ref <130)
Triglycerides: 113 mg/dL (ref <150)
VLDL: 23 mg/dL (ref <30)

## 2015-11-05 ENCOUNTER — Telehealth: Payer: Self-pay | Admitting: Obstetrics and Gynecology

## 2015-11-05 NOTE — Telephone Encounter (Signed)
Spoke with patient. Patient states that she has a vagina boil or cyst that is tender. Denies any fevers or chills. States that area will shrink, but never goes away. Patient declines an appointment for today. Appointment moved up to tomorrow 11/17 at 12:45 pm with Dr.Silva. Agreeable to date and time.  Routing to provider for final review. Patient agreeable to disposition. Will close encounter.

## 2015-11-05 NOTE — Telephone Encounter (Signed)
Patient calling in regards to vaginal boil possible cyst on vagina she said it sometimes becomes tender. It will shrink in size but will not go away. Offered appointment for today patient declined, scheduled her with next available for Dr. Quincy Simmonds 11/07/2015. Hope this is okay Best # [phone]

## 2015-11-06 ENCOUNTER — Ambulatory Visit (INDEPENDENT_AMBULATORY_CARE_PROVIDER_SITE_OTHER): Payer: Medicare Other | Admitting: Obstetrics and Gynecology

## 2015-11-06 ENCOUNTER — Encounter: Payer: Self-pay | Admitting: Obstetrics and Gynecology

## 2015-11-06 VITALS — BP 142/78 | HR 70 | Resp 16 | Ht 59.0 in | Wt 142.6 lb

## 2015-11-06 DIAGNOSIS — I2510 Atherosclerotic heart disease of native coronary artery without angina pectoris: Secondary | ICD-10-CM | POA: Diagnosis not present

## 2015-11-06 DIAGNOSIS — N907 Vulvar cyst: Secondary | ICD-10-CM

## 2015-11-06 NOTE — Progress Notes (Signed)
Patient ID: Cassie Campbell, female   DOB: [birth year], 74 y.o.   MRN: [id number] GYNECOLOGY  VISIT   HPI: 74 y.o.   Married  [place] American  female   [phone] with Patient's last menstrual period was 12/21/1999.   here for left vulvar lesion which she states has been present since August.  Patient states at times it is tender and larger than it is today.   Never goes away.  Can be sore to the touch.  No drainage from it.  Tried to drain it but could not do this.  No fevers.  Wants it removed.  Hx of epidermoid cyst.   On Plavix for the last 3 - 4 months.  Was on another blood thinner but stopped due to a rash.   GYNECOLOGIC HISTORY: Patient's last menstrual period was 12/21/1999. Contraception:Postmenopausal Menopausal hormone therapy: none Last mammogram: 11-19-14 Density Cat.C/benign calcifications both breasts/[facility] Last pap smear: 10-21-09 Negative        OB History    Gravida Para Term Preterm AB TAB SAB Ectopic Multiple Living   2 1 1  1  1   1          Patient Active Problem List   Diagnosis Date Noted  . CAD in native artery 10/28/2015  . Tinea cruris 07/20/2015  . Right bundle branch block 04/28/2015  . Pain in the chest   . Chest pain, atypical 11/06/2014  . HCAP (healthcare-associated pneumonia) 11/05/2014  . CAD (coronary artery disease)   . Chest pain 10/15/2014  . NSTEMI (non-ST elevated myocardial infarction) ([place]) 10/15/2014  . Health care maintenance 10/22/2013  . Arthralgia of hip 02/12/2013  . GERD (gastroesophageal reflux disease) 03/10/2012  . Intertriginous candidiasis 11/09/2011  . Atelectasis 02/24/2009  . BENIGN POSITIONAL VERTIGO 11/10/2007  . Hyperlipidemia with target LDL less than 70 10/05/2007  . Essential hypertension 10/05/2007  . ALLERGIC RHINITIS 10/05/2007  . Moderate persistent asthma in adult without complication [id number]  . Disorder of bone and cartilage 10/05/2007    Past Medical History  Diagnosis Date  . Benign  positional vertigo   . Osteopenia     dexa 08/22/07 AP spine + 1.1, left femur -1.3, right femur -.8; dexa 10/06/09 +1.6, left femur =1.6, right femur -.  . Hypertension   . Hyperlipidemia     <130 ldl pos fm hx, bp  . Obesity   . Asthma     xolair s 8/05 ?11/07; mastered hfa 12/20/08  . Allergic rhinitis   . Ruptured disc, cervical   . Depression   . PONV (postoperative nausea and vomiting)   . CAD (coronary artery disease)     a. 09/2014 NSTEMI s/p LHC with sig 2V dz. dLAD diffusely diseased and not suitable for PCI. unsuccessful RCA PCI d/t heavy calcifications  . Heart attack ([place])     09/2014    Past Surgical History  Procedure Laterality Date  . Cervical disc surgery  12/01  . Vein ligation and stripping    . Vulva /perineum biopsy  12-30-10    --epidermoid cyst  . Breast surgery  10-26-10    Rt. breast bx--for microcalcifications in rt. retroareolar region--dx was hyalinized fibroadenoma  . Bunionectomy Bilateral   . Left heart catheterization with coronary angiogram N/A 10/16/2014    Procedure: LEFT HEART CATHETERIZATION WITH CORONARY ANGIOGRAM;  Surgeon: Blane Ohara, MD;  Location[facility] C[facility] LAB;  Service: Cardiovascular;  Laterality: N/A;  . Percutaneous coronary rotoblator intervention (pci-r) N/A 10/17/2014    Procedure:  PERCUTANEOUS CORONARY ROTOBLATOR INTERVENTION (PCI-R);  Surgeon: Troy Sine, MD;  Location: [facility] CATH LAB;  Service: Cardiovascular;  Laterality: N/A;  . Video bronchoscopy Bilateral 02/05/2015    Procedure: VIDEO BRONCHOSCOPY WITHOUT FLUORO;  Surgeon: Tanda Rockers, MD;  Location: [facility] ENDOSCOPY;  Service: Endoscopy;  Laterality: Bilateral;    Current Outpatient Prescriptions  Medication Sig Dispense Refill  . albuterol (PROAIR HFA) 108 (90 BASE) MCG/ACT inhaler 1-2 every 4-6 hours as needed for wheezing    . amLODipine (NORVASC) 10 MG tablet take 1 tablet by mouth once daily (Patient taking differently: take 1 tablet by mouth every morning) 90 tablet  1  . atorvastatin (LIPITOR) 80 MG tablet Take 1 tablet (80 mg total) by mouth daily at 6 PM. NEED OV. 30 tablet 2  . bisoprolol (ZEBETA) 5 MG tablet Take 1 tablet by mouth every morning.   1  . Calcium Carbonate-Vit D-Min 1200-1000 MG-UNIT CHEW Chew 1 tablet by mouth every evening.     . cetirizine (ZYRTEC) 10 MG tablet Take 10 mg by mouth at bedtime as needed (itching and sneezing).     . clopidogrel (PLAVIX) 75 MG tablet take 1 tablet by mouth once daily 30 tablet 1  . clotrimazole (LOTRIMIN) 1 % cream Apply 1 application topically 2 (two) times daily. (Patient taking differently: Use as directed for rash) 60 g 11  . clotrimazole-betamethasone (LOTRISONE) cream Apply 1 application topically 2 (two) times daily as needed (itching).     Marland Kitchen dextromethorphan-guaiFENesin (MUCINEX DM) 30-600 MG per 12 hr tablet Take 1-2 tablets by mouth every 12 (twelve) hours as needed (cough).     . famotidine (PEPCID) 20 MG tablet Take 20 mg by mouth at bedtime.     . fluticasone (FLONASE) 50 MCG/ACT nasal spray Place 2 sprays into both nostrils 2 (two) times daily.    . fluticasone (FLOVENT HFA) 110 MCG/ACT inhaler Inhale 2 puffs into the lungs 2 (two) times daily. 1 Inhaler 11  . isosorbide mononitrate (IMDUR) 60 MG 24 hr tablet Take 1.5 tablets (90mg ) by mouth in the morning and 0.5 (30mg ) tablet in the evening. 180 tablet 9  . meclizine (ANTIVERT) 25 MG tablet TAKE 2 TABLETS BY MOUTH  3 TIMES DAILY AS NEEDED (Patient taking differently: TAKE 1- 2 TABLETS BY MOUTH  EVERY 8 HOURS AS NEEDED FOR DIZZINESS) 24 tablet 5  . montelukast (SINGULAIR) 10 MG tablet Take 10 mg by mouth at bedtime.    . Multiple Vitamin (MULTIVITAMIN) capsule Take 1 capsule by mouth every morning.     Marland Kitchen omeprazole (PRILOSEC) 20 MG capsule Take 20 mg by mouth daily before breakfast.     . oxymetazoline (AFRIN) 0.05 % nasal spray 2 puffs every 12 hours for 5 days as needed for stuffy nose and sinus problems    . ranolazine (RANEXA) 500 MG 12 hr  tablet Take 1 tablet (500 mg total) by mouth 2 (two) times daily. 60 tablet 6  . sodium chloride (OCEAN) 0.65 % nasal spray Place 2 sprays into the nose every 4 (four) hours as needed (nasal congestion).     . traMADol (ULTRAM) 50 MG tablet Take 50 mg by mouth every 4 (four) hours as needed (severe cough or pain).    . budesonide-formoterol (SYMBICORT) 160-4.5 MCG/ACT inhaler Inhale 2 puffs into the lungs 2 (two) times daily. 1 Inhaler 0   No current facility-administered medications for this visit.     ALLERGIES: Codeine phosphate; Aspirin; Brilinta; Diphenhydramine hcl; Fish-derived products; and Penicillins  Family History  Problem Relation Age of Onset  . Diabetes Mother     AODM  . Stroke Mother   . Hypertension Mother   . Heart disease Father   . Breast cancer Daughter 36    dec--mets to liver/spine  . Diabetes Sister     AODM  . Breast cancer Sister   . Hypertension Sister     Social History   Social History  . Marital Status: Married    Spouse Name: N/A  . Number of Children: N/A  . Years of Education: N/A   Occupational History  . retired Tour manager    Social History Main Topics  . Smoking status: Never Smoker   . Smokeless tobacco: Never Used  . Alcohol Use: No  . Drug Use: No  . Sexual Activity: No   Other Topics Concern  . Not on file   Social History Narrative    ROS:  Pertinent items are noted in HPI.  PHYSICAL EXAMINATION:    BP 142/78 mmHg  Pulse 70  Resp 16  Ht 4\' 11"  (1.499 m)  Wt 142 lb 9.6 oz (64.683 kg)  BMI 28.79 kg/m2  LMP 12/21/1999    General appearance: alert, cooperative and appears stated age   Pelvic: External genitalia:   Left superior vulva with 0.75 cm slightly tender superficial sebaceous cyst.  No erythema of the vulva noted.               Urethra:  normal appearing urethra with no masses, tenderness or lesions              Bartholins and Skenes: normal            Chaperone was present for  exam.  ASSESSMENT  Left sebaceous cyst.  No abscess or cellulitis noted. On Plavix.    PLAN  Counseled regarding sebaceous cysts.  Desires removal so I discussed this office procedure with patient.  She will need to come off her Plavix for the removal which will require local anesthesia, sharp excision, and suturing of the vulva.  Will send a note to Dr. Shelva Majestic, her cardiologist for recommendations for amount of time to be off the Plavix prior to procedure and then when to resume.  Will then contact patient to schedule office procedure.   Do not attempt to drain on own.  This may cause cellulitis of the vulva.   An After Visit Summary was printed and given to the patient.  _15___ minutes face to face time of which over 50% was spent in counseling.

## 2015-11-06 NOTE — Patient Instructions (Signed)
Epidermal Cyst An epidermal cyst is sometimes called a sebaceous cyst, epidermal inclusion cyst, or infundibular cyst. These cysts usually contain a substance that looks "pasty" or "cheesy" and may have a bad smell. This substance is a protein called keratin. Epidermal cysts are usually found on the face, neck, or trunk. They may also occur in the vaginal area or other parts of the genitalia of both men and women. Epidermal cysts are usually small, painless, slow-growing bumps or lumps that move freely under the skin. It is important not to try to pop them. This may cause an infection and lead to tenderness and swelling. CAUSES  Epidermal cysts may be caused by a deep penetrating injury to the skin or a plugged hair follicle, often associated with acne. SYMPTOMS  Epidermal cysts can become inflamed and cause:  Redness.  Tenderness.  Increased temperature of the skin over the bumps or lumps.  Grayish-white, bad smelling material that drains from the bump or lump. DIAGNOSIS  Epidermal cysts are easily diagnosed by your caregiver during an exam. Rarely, a tissue sample (biopsy) may be taken to rule out other conditions that may resemble epidermal cysts. TREATMENT   Epidermal cysts often get better and disappear on their own. They are rarely ever cancerous.  If a cyst becomes infected, it may become inflamed and tender. This may require opening and draining the cyst. Treatment with antibiotics may be necessary. When the infection is gone, the cyst may be removed with minor surgery.  Small, inflamed cysts can often be treated with antibiotics or by injecting steroid medicines.  Sometimes, epidermal cysts become large and bothersome. If this happens, surgical removal in your caregiver's office may be necessary. HOME CARE INSTRUCTIONS  Only take over-the-counter or prescription medicines as directed by your caregiver.  Take your antibiotics as directed. Finish them even if you start to feel  better. SEEK MEDICAL CARE IF:   Your cyst becomes tender, red, or swollen.  Your condition is not improving or is getting worse.  You have any other questions or concerns. MAKE SURE YOU:  Understand these instructions.  Will watch your condition.  Will get help right away if you are not doing well or get worse.   This information is not intended to replace advice given to you by your health care provider. Make sure you discuss any questions you have with your health care provider.   Document Released: 11/06/2004 Document Revised: 02/28/2012 Document Reviewed: 06/14/2011 Elsevier Interactive Patient Education 2016 Elsevier Inc.  

## 2015-11-07 ENCOUNTER — Ambulatory Visit: Payer: Medicare Other | Admitting: Obstetrics and Gynecology

## 2015-11-10 ENCOUNTER — Telehealth: Payer: Self-pay | Admitting: Internal Medicine

## 2015-11-10 MED ORDER — ALBUTEROL SULFATE HFA 108 (90 BASE) MCG/ACT IN AERS: 1.0000 | INHALATION_SPRAY | RESPIRATORY_TRACT | Status: DC | PRN

## 2015-11-10 NOTE — Telephone Encounter (Signed)
Called pt X2. Line would ring then d/c. WCB

## 2015-11-10 NOTE — Telephone Encounter (Signed)
Spoke with pt, requesting refill on proair.  This has been sent.  Nothing further needed.

## 2015-11-16 ENCOUNTER — Other Ambulatory Visit: Payer: Self-pay | Admitting: Internal Medicine

## 2015-11-25 ENCOUNTER — Encounter: Payer: Self-pay | Admitting: Internal Medicine

## 2015-11-25 ENCOUNTER — Ambulatory Visit (INDEPENDENT_AMBULATORY_CARE_PROVIDER_SITE_OTHER)
Admission: RE | Admit: 2015-11-25 | Discharge: 2015-11-25 | Disposition: A | Discharge status: Home / Self Care | Payer: Medicare Other | Source: Ambulatory Visit | Attending: Internal Medicine | Admitting: Internal Medicine

## 2015-11-25 ENCOUNTER — Ambulatory Visit (INDEPENDENT_AMBULATORY_CARE_PROVIDER_SITE_OTHER): Payer: Medicare Other | Admitting: Internal Medicine

## 2015-11-25 VITALS — BP 124/68 | HR 86 | Ht 59.0 in | Wt 143.0 lb

## 2015-11-25 DIAGNOSIS — J9811 Atelectasis: Secondary | ICD-10-CM

## 2015-11-25 DIAGNOSIS — B356 Tinea cruris: Secondary | ICD-10-CM | POA: Diagnosis not present

## 2015-11-25 DIAGNOSIS — J4540 Moderate persistent asthma, uncomplicated: Secondary | ICD-10-CM

## 2015-11-25 DIAGNOSIS — I2510 Atherosclerotic heart disease of native coronary artery without angina pectoris: Secondary | ICD-10-CM

## 2015-11-25 MED ORDER — PREDNISONE 10 MG PO TABS: ORAL_TABLET | ORAL | Status: DC

## 2015-11-25 MED ORDER — BECLOMETHASONE DIPROPIONATE 80 MCG/ACT IN AERS: INHALATION_SPRAY | RESPIRATORY_TRACT | Status: DC

## 2015-11-25 NOTE — Progress Notes (Signed)
Quick Note:  Spoke with pt and notified of results per Dr. [name]. Pt verbalized understanding and denied any questions.  ______ 

## 2015-11-25 NOTE — Patient Instructions (Addendum)
Change flovent to qvar 80 Take 2 puffs first thing in am and then another 2 puffs about 12 hours later.   Prednisone 10 mg take  4 each am x 2 days,   2 each am x 2 days,  1 each am x 2 days and stop   Please remember to go to the xray department downstairs for your tests - we will call you with the results when they are available.   Please schedule a follow up visit in 3 months but call sooner if needed

## 2015-11-26 ENCOUNTER — Other Ambulatory Visit: Payer: Self-pay | Admitting: Cardiovascular Disease

## 2015-11-26 ENCOUNTER — Encounter: Payer: Self-pay | Admitting: Internal Medicine

## 2015-11-26 NOTE — Assessment & Plan Note (Addendum)
(  [name] not active)  -Xolair rx  07/2004 > 10/2006  - IgE  296 11/20/2013  - Prevnar rx 05/07/14  - 11/25/2015  extensive coaching HFA effectiveness =    75%  DDX of  difficult airways management all start with A and  include Adherence, Ace Inhibitors, Acid Reflux, Active Sinus Disease, Alpha 1 Antitripsin deficiency, Anxiety masquerading as Airways dz,  ABPA,  allergy(esp in young), Aspiration (esp in elderly), Adverse effects of meds,  Active smokers, A bunch of PE's (a small clot burden can't cause this syndrome unless there is already severe underlying pulm or vascular dz with poor reserve) plus two Bs  = Bronchiectasis and Beta blocker use..and one C= CHF   Adherence is always the initial "prime suspect" and is a multilayered concern that requires a "trust but verify" approach in every patient - starting with knowing how to use medications, especially inhalers, correctly, keeping up with refills and understanding the fundamental difference between maintenance and prns vs those medications only taken for a very short course and then stopped and not refilled.  - - The proper method of use, as well as anticipated side effects, of a metered-dose inhaler are discussed and demonstrated to the patient. Improved effectiveness after extensive coaching during this visit to a level of approximately 75 % from a baseline of 50% so needs more work to get full benefit of ics in multiple forms = symb and qvar until better controlled, then wean down symbicort if possible  - Formulary restrictions will be an ongoing challenge for the forseable future and I would be happy to pick an alternative if the pt will first  provide me a list of them but pt  will need to return here for training for any new device that is required eg dpi vs hfa vs respimat.    In meantime we can always provide samples so the patient never runs out of any needed respiratory medications. = 3 m of qvar 80 given today then regroup in 3 m  ? ABPA >  if not better controlled refer back to Dr Neldon Mc for ? Arvid Right ?   ? Allergies >  Continue singulair plus Prednisone 10 mg take  4 each am x 2 days,   2 each am x 2 days,  1 each am x 2 days and stop    ? BB doubt on zebeta, which is very Beta specific   I had an extended discussion with the patient reviewing all relevant studies completed to date and  lasting 15 to 20 minutes of a 25 minute visit    Each maintenance medication was reviewed in detail including most importantly the difference between maintenance and prns and under what circumstances the prns are to be triggered using an action plan format that is not reflected in the computer generated alphabetically organized AVS but trather by a customized med calendar that reflects the AVS meds with confirmed 100% correlation.   Please see instructions for details which were reviewed in writing and the patient given a copy highlighting the part that I personally wrote and discussed at today's ov.

## 2015-11-26 NOTE — Telephone Encounter (Signed)
Patient calling the office for samples of medication: ° ° °1.  What medication and dosage are you requesting samples for? Ranexa 500mg ° °2.  Are you currently out of this medication? Yes ° ° ° ° °

## 2015-11-26 NOTE — Telephone Encounter (Signed)
Called patient, advised of samples left up front for her to pickup.

## 2015-11-26 NOTE — Progress Notes (Signed)
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 Follow in pulmonary clinic also for primary care with hbp/ hyperlipidemia complicated by MI [id number] and persistent mucus plug in RML/ RLL s/p fob 02/05/15 with marked improvement p lavage and no endobrchial lesions, just diffuse airway swelling      History of Present Illness   10/14/13 [name] > laser rx for back pain  rad legs > resolved    Admit Date: 10/15/2014 Discharge date: 10/19/2014    PRIMARY DISCHARGE DIAGNOSIS:  NSTEMI (non-ST elevated myocardial infarction)   Essential hypertension  GERD (gastroesophageal reflux disease)  Chest pain  Hyperlipidemia  Hypertension  CAD (coronary artery disease)  11/28/2014 acute ov/Rondell Frick re: asthma flare despite qvar and symbicort max doses  Chief Complaint  Patient presents with  . Acute Visit    Pt c/o increased wheezing over the past month- esp worse at night.    Wheezing worse at 3 am, sits up and better p saba  > back to sleep,  Onset was indolent and increased as lopressor dose was increased  >>d/c lopressor , rx bisoprolol , pred taper   01/28/2015 Follow up and Med Review    Patient returns for 6 week follow-up and medication review We reviewed all her medications organized them into a medication calendar with patient education . Pt has had 2 hospitalization over last 4 months with NSTEMI in  Oct and HCAP in Nov.  She is starting to feel better , getting back to her baseline.  Last office visit was changed from Lopressor to bisoprolol. Due to wheezing Patient says that she is improved with resolution of wheezing. Chest x-ray last visit showed a persistent right middle and right lower lobe infiltrate. She denies any hemoptysis, unintentional  weight loss, chest pain, orthopnea, PND, leg swelling, or syncope. She is being followed by cardiology for her underlying coronary artery disease. 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 Annual Exam    Pt is fasting. She c/o increased wheezing for the past month. She is using albuterol inhaler approx 4 x per day.      Dr Claiborne Billings following lipids.  Since changed qvar to flovent (insurance restriction) more dep on symbicort 160 2 bid and prn saba for chest tightness/ subj wheeze better always p saba rx   No obvious day to day or daytime variability or assoc chronic cough or cp or   overt sinus or hb symptoms. No unusual exp hx or h/o childhood pna/ asthma or knowledge of premature birth.  Sleeping ok without nocturnal  or early am exacerbation  of respiratory  c/o's or need for noct saba. Also denies any obvious fluctuation of symptoms with weather or environmental changes or other aggravating or alleviating factors except as outlined above   Current Medications, Allergies, Complete Past Medical History, Past Surgical History, Family History, and Social History were reviewed in [employer] record.  ROS  The following are not active complaints unless bolded sore throat, dysphagia, dental problems, itching, sneezing,  nasal  congestion or excess/ purulent secretions, ear ache,   fever, chills, sweats, unintended wt loss, classically pleuritic or exertional cp, hemoptysis,  orthopnea pnd or leg swelling, presyncope, palpitations, abdominal pain, anorexia, nausea, vomiting, diarrhea  or change in bowel or bladder habits, change in stools or urine, dysuria,hematuria,  rash, arthralgias, visual complaints, headache, numbness, weakness or ataxia or problems with walking or coordination,  change in  mood/affect or memory.                 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 [place]...........................................................................Marland KitchenWert  - Td 05/20/2015  - Pneumovax 09/2003 and October 27, 2009  Prevnar 05/07/2014  - Colonscopy 10/28/2004  - CPX 11/19/13     - GYN per [name]'s office -Mammogram 2013 , 2014  MED CALENDAR 05/05/11 , 05/16/2013 , 08/06/2014 , 01/28/2015 , 08/26/2015        Objective:   Physical Exam  Wt 143 11/09/2011 >  04/12/2012  140 > 06/26/2012  139 >  11/13/2012 135 >  137 02/12/2013 >141 05/16/2013 > 07/31/2013  139 >  11/19/2013  134 >135 02/18/2014 > 04/16/2014 134 > 05/07/2014  134 >131 08/06/2014 > 10/30/2014 130 > 11/28/2014 128 > 02/18/2015   133 > 05/20/2015 133 > 07/14/2015   135 >137 08/26/2015 > 11/25/2015 143   [facility] amb bf nad/ vital signs reviewed  HEENT: top dentures, nl turbinates, and orophanx. Nl external ear canals without cough reflex   NECK :  without JVD/Nodes/TM/ nl carotid upstrokes bilaterally   LUNGS: no acc muscle use,    bilateral mid exp wheezes diffusely    CV:  RRR  no s3 or murmur or increase in P2, no edema   ABD:  soft and nontender with nl excursion in the supine position. No bruits or organomegaly, bowel sounds nl  MS:  warm without deformities, calf tenderness, cyanosis or clubbing  SKIN: clear w/o rash   NEURO:  alert, approp, no  deficits on motor/cerbellar testing, nl reflexes/ no nystagmus     CXR PA and Lateral:   11/25/2015 :    I personally reviewed images and agree with radiology impression as follows:   Right middle lobe collapse. This has been present since CT of 11/05/2014 and CT of the chest is recommended to exclude and endobronchial lesion              Assessment & Plan:

## 2015-11-26 NOTE — Assessment & Plan Note (Signed)
XR- 10/2010 Stable volume loss in the right middle lobe vs anterobasal seg RLL, most  apparent on the lateral view. Mild pectus excavatum deformity > no change 11/09/2011  - See CTa 11/06/14 Right middle lobe and lower lobe consolidation / pneumonia with opacified bronchus intermedius and more distal airways. This might be due to aspiration, and difficult to exclude a early necrotizing pneumonia in the middle lobe. No hilar lymphadenopathy or obstructing mass identified - FOB 02/05/15 > copious mucopurulent plugs R > L with neg cytology but mod growth pseudomonas sens to cipro > rec cipro 750 bid x 10 days - 02/18/15  cxr  Repeat much better, nothing on pa view at all now, band like atx RLL ant basal segment  cxr reviewed, no change, no further w/u indicated at this point

## 2015-12-01 ENCOUNTER — Telehealth: Payer: Medicare Other | Admitting: Obstetrics and Gynecology

## 2015-12-01 DIAGNOSIS — N907 Vulvar cyst: Secondary | ICD-10-CM

## 2015-12-01 NOTE — Telephone Encounter (Signed)
Routing message to Dr. Quincy Simmonds for review.

## 2015-12-01 NOTE — Telephone Encounter (Signed)
My apologies to the patient.  I have not received a message back from her cardiologist.  Please contact her cardiology office about her Plavix and the time she needs to be off for the excision of the sebaceous cyst.  This is an office procedure.  This excision of sebaceous cyst will also need to be precerted.  Thanks.   [place]

## 2015-12-01 NOTE — Telephone Encounter (Signed)
Patient calling to find out if Dr. Quincy Simmonds has heard from her cardiologist so she can have cyst removed.

## 2015-12-02 ENCOUNTER — Encounter: Payer: Self-pay | Admitting: Internal Medicine

## 2015-12-02 ENCOUNTER — Telehealth: Payer: Self-pay | Admitting: Cardiovascular Disease

## 2015-12-02 NOTE — Telephone Encounter (Signed)
Call to Dr. Evette Georges office. Dr. Claiborne Billings not in today. Requested instructions regarding ASA and Plavix for in office procedure.  To call or route message back.   Call to patient to advise. She is agreeable and will wait to hear from our office regarding scheduling.

## 2015-12-02 NOTE — Telephone Encounter (Signed)
Cassie Campbell is calling because Cassie Campbell is on Aspirin and Plavix and is going to have Excision Sebaceous cyst of the Vulva that will done in office . Wants to know when can she stop the medication and restart the medication . Please call .Marland Kitchen  Thanks

## 2015-12-05 NOTE — Telephone Encounter (Signed)
Will need to hold for 5 days prior to procedure.

## 2015-12-08 ENCOUNTER — Encounter: Payer: Self-pay | Admitting: *Deleted

## 2015-12-08 NOTE — Telephone Encounter (Signed)
Troy Sine, MD at 12/05/2015 6:02 PM     Status: Signed       Expand All Collapse All   Will need to hold for 5 days prior to procedure.       Message from [facility] at Dr. Evette Georges office.  She states that restart of medication is up to Dr. Quincy Simmonds.

## 2015-12-08 NOTE — Telephone Encounter (Signed)
Ok to instruct patient regarding stopping Plavix and ASA 5 days prior to procedure.  OK to schedule office procedure for excision of sebaceous cyst.   Thank you.

## 2015-12-08 NOTE — Telephone Encounter (Signed)
Dr. Quincy Simmonds,  Okay to instruct patient per Dr. Claiborne Billings and schedule procedure?

## 2015-12-08 NOTE — Telephone Encounter (Signed)
Cassie Campbell with Dr. Danice Goltz office. Informed her of Dr. Evette Georges recommendations.

## 2015-12-09 NOTE — Telephone Encounter (Signed)
Spoke with patient. Reviewed benefit for vulvar lesion excisions. Patient understood and agreeable. Ready to schedule. Transferred call to [employer] to schedule.

## 2015-12-09 NOTE — Telephone Encounter (Signed)
Call to patient home address. No answer. No voicemail.

## 2015-12-09 NOTE — Telephone Encounter (Signed)
Spoke with patient after speaking with Diplomatic Services operational officer.   She is given instructions from Dr. Quincy Simmonds.  She states she does not take daily ASA. Confirms use of Plavix. She is scheduled for excision 12/24/15 at 1500 with Dr. Quincy Simmonds.  She will take her last dose of Plavix on 12/19/15. She will call back with any questions or concerns and verbalizes understanding of pre-procedure instructions.   Routing to provider for final review. Patient agreeable to disposition. Will close encounter.    cc Kerry Hough

## 2015-12-09 NOTE — Telephone Encounter (Signed)
Call to patient to review benefits for biopsies. Left voicemail to return call.

## 2015-12-16 ENCOUNTER — Other Ambulatory Visit: Payer: Self-pay | Admitting: Cardiovascular Disease

## 2015-12-16 NOTE — Telephone Encounter (Signed)
Rx request sent to pharmacy.  

## 2015-12-17 ENCOUNTER — Encounter: Payer: Self-pay | Admitting: *Deleted

## 2015-12-21 ENCOUNTER — Other Ambulatory Visit: Payer: Self-pay | Admitting: Internal Medicine

## 2015-12-24 ENCOUNTER — Ambulatory Visit (INDEPENDENT_AMBULATORY_CARE_PROVIDER_SITE_OTHER): Payer: Medicare Other | Admitting: Obstetrics and Gynecology

## 2015-12-24 ENCOUNTER — Encounter: Payer: Self-pay | Admitting: Obstetrics and Gynecology

## 2015-12-24 DIAGNOSIS — N907 Vulvar cyst: Secondary | ICD-10-CM

## 2015-12-24 DIAGNOSIS — L723 Sebaceous cyst: Secondary | ICD-10-CM | POA: Diagnosis not present

## 2015-12-24 NOTE — Progress Notes (Signed)
Subjective:     Patient ID: Cassie Campbell, female   DOB: [birth year], 75 y.o.   MRN: [id number]  HPI:   75 y.o.   Married  [place] American  female   [phone] with Patient's last menstrual period was 12/21/1999.  here for   Cyst Excision - sebaceous cyst of the vulva.   Off Plavix for 5 days in preparation for procedure, direction from cardiologist, Dr. Claiborne Billings.  GYNECOLOGIC HISTORY: Patient's last menstrual period was 12/21/1999. Contraception: post Menopausal  Menopausal hormone therapy: None Last mammogram: 11/19/14 BIRADS2:Benign  Last pap smear: 10/21/09 Neg   Review of Systems     Objective:   Physical Exam 0.75 cm sebaceous cyst of the left superior labia majora lateral to the clitoris.     Procedure - excision of vulvar sebaceous cyst.  Consent for procedure.  Sterile prep with Hibiclens.  Local 1% lidocaine, 6 cc - lot [id number], exp 02/17/17.  Scalpel used to excision the cyst.  To pathology.  2 interrupted sutures of 3/0 vicryl. Good hemostasis.  Minimal EBL.  No complications.    Assessment:     Sebaceous cyst of the vulva.  Removed.     Plan:     Education about sebaceous cysts.  Post excision instructions and precautions given.  Tissue to pathology.  Restart Plavix in 24 hours if bleeding from biopsy site is minimal.  Patient instructed in restarting. Follow up in 10 days.   After visit summary to patient.

## 2015-12-24 NOTE — Progress Notes (Deleted)
Patient ID: Cassie Campbell, female   DOB: [birth year], 75 y.o.   MRN: [id number]

## 2015-12-24 NOTE — Patient Instructions (Signed)
VULVAR  CYST EXCISION POST-PROCEDURE INSTRUCTIONS  1. You may take Ibuprofen, Aleve or Tylenol for pain if needed.    2. You may have a small amount of spotting.  You should wear a mini pad for the next few days.  3. You may use some topical Neosporin ointment if you would like (over the counter is fine).  You may use this when bleeding stops and sutures are dissolved or removed.  4. You need to call if you have redness around the biopsy site, if there is any unusual draining, if the bleeding is heavy, or if you are concerned.  5. Shower or bathe as normal  6. We will call you within one week with results or we will discuss the results at your follow-up appointment if needed.  7.  Call for heavy bleeding, fever, or increasing pain.

## 2015-12-29 ENCOUNTER — Ambulatory Visit (INDEPENDENT_AMBULATORY_CARE_PROVIDER_SITE_OTHER): Payer: Medicare Other | Admitting: Internal Medicine

## 2015-12-29 ENCOUNTER — Encounter: Payer: Self-pay | Admitting: Internal Medicine

## 2015-12-29 VITALS — BP 126/82 | HR 85 | Temp 98.2°F | Ht 59.0 in | Wt 137.0 lb

## 2015-12-29 DIAGNOSIS — J4540 Moderate persistent asthma, uncomplicated: Secondary | ICD-10-CM

## 2015-12-29 LAB — IPS OTHER TISSUE BIOPSY

## 2015-12-29 MED ORDER — DOXYCYCLINE HYCLATE 100 MG PO TABS: 100.0000 mg | ORAL_TABLET | Freq: Two times a day (BID) | ORAL | Status: DC

## 2015-12-29 MED ORDER — PREDNISONE 10 MG PO TABS: ORAL_TABLET | ORAL | Status: DC

## 2015-12-29 MED ORDER — TRAMADOL HCL 50 MG PO TABS: 50.0000 mg | ORAL_TABLET | ORAL | Status: DC | PRN

## 2015-12-29 NOTE — Progress Notes (Signed)
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 Follow in pulmonary clinic also for primary care with hbp/ hyperlipidemia complicated by MI [id number] and persistent mucus plug in RML/ RLL s/p fob 02/05/15 with marked improvement p lavage and no endobrchial lesions, just diffuse airway swelling      History of Present Illness   10/14/13 [name] > laser rx for back pain  rad legs > resolved    Admit Date: 10/15/2014 Discharge date: 10/19/2014    PRIMARY DISCHARGE DIAGNOSIS:  NSTEMI (non-ST elevated myocardial infarction)   Essential hypertension  GERD (gastroesophageal reflux disease)  Chest pain  Hyperlipidemia  Hypertension  CAD (coronary artery disease)  11/28/2014 acute ov/Cassie Campbell re: asthma flare despite qvar and symbicort max doses  Chief Complaint  Patient presents with  . Acute Visit    Pt c/o increased wheezing over the past month- esp worse at night.    Wheezing worse at 3 am, sits up and better p saba  > back to sleep,  Onset was indolent and increased as lopressor dose was increased  >>d/c lopressor , rx bisoprolol , pred taper   01/28/2015 Follow up and Med Review    Patient returns for 6 week follow-up and medication review We reviewed all her medications organized them into a medication calendar with patient education . Pt has had 2 hospitalization over last 4 months with NSTEMI in  Oct and HCAP in Nov.  She is starting to feel better , getting back to her baseline.  Last office visit was changed from Lopressor to bisoprolol. Due to wheezing Patient says that she is improved with resolution of wheezing. Chest x-ray last visit showed a persistent right middle and right lower lobe infiltrate. She denies any hemoptysis, unintentional  weight loss, chest pain, orthopnea, PND, leg swelling, or syncope. She is being followed by cardiology for her underlying coronary artery disease. 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 Annual Exam    Pt is fasting. She c/o increased wheezing for the past month. She is using albuterol inhaler approx 4 x per day.   Dr Claiborne Billings following lipids.  Since changed qvar to flovent (insurance restriction) more dep on symbicort 160 2 bid and prn saba for chest tightness/ subj wheeze better always p saba rx  rec Change flovent to qvar 80 Take 2 puffs first thing in am and then another 2 puffs about 12 hours later.  Prednisone 10 mg take  4 each am x 2 days,   2 each am x 2 days,  1 each am x 2 days and stop      12/29/2015 acute extended ov/Cassie Campbell re: cough to point of ribs sore on qvar 80 2bid Chief Complaint  Patient presents with  . Acute Visit    Pt c/o fever, cough, soreness in ribs x 2-3 wks. Cough is prod with yellow sputum. She also c/o wheezing and increased SOB- using rescue inhaler approx 6 x per day.    no fever since  5 days prior to OV  but mucus is thick and yellow  / rib soreness is gen bilaterally  Cough worse at hs / not using med calendar action plan aggressively / no using tramadol / poor hfa   No obvious  pattersn day to day or daytime variability or assoc cp or   overt sinus or hb symptoms. No unusual exp hx or h/o childhood pna/ asthma or knowledge of premature birth.  Sleeping ok without nocturnal  or early am exacerbation  of respiratory  c/o's or need for noct saba. Also denies any obvious fluctuation of symptoms with weather or environmental changes or other aggravating or alleviating factors except as outlined above   Current Medications, Allergies, Complete Past Medical History, Past Surgical  History, Family History, and Social History were reviewed in [employer] record.  ROS  The following are not active complaints unless bolded sore throat, dysphagia, dental problems, itching, sneezing,  nasal congestion or excess/ purulent secretions, ear ache,   fever, chills, sweats, unintended wt loss, classically pleuritic or exertional cp, hemoptysis,  orthopnea pnd or leg swelling, presyncope, palpitations, abdominal pain, anorexia, nausea, vomiting, diarrhea  or change in bowel or bladder habits, change in stools or urine, dysuria,hematuria,  rash, arthralgias, visual complaints, headache, numbness, weakness or ataxia or problems with walking or coordination,  change in mood/affect or memory.                 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 [place]...........................................................................Marland KitchenWert  - Td 05/20/2015  - Pneumovax 09/2003 and October 27, 2009  Prevnar 05/07/2014  - Colonscopy 10/28/2004  - CPX 11/19/13     - GYN per [name]'s office -Mammogram 2013 , 2014  MED CALENDAR 05/05/11 , 05/16/2013 , 08/06/2014 , 01/28/2015 , 08/26/2015        Objective:   Physical Exam  Wt 143 11/09/2011 >  04/12/2012  140 > 06/26/2012  139 >  11/13/2012 135 >  137 02/12/2013 >141 05/16/2013 > 07/31/2013  139 >  11/19/2013  134 >135 02/18/2014 > 04/16/2014 134 > 05/07/2014  134 >131  08/06/2014 > 10/30/2014 130 > 11/28/2014 128 > 02/18/2015   133 > 05/20/2015 133 > 07/14/2015   135 >137 08/26/2015 > 11/25/2015 143 > 12/29/2015  137   Pleasant amb bf nad/ vital signs reviewed  HEENT: top dentures, nl turbinates, and orophanx. Nl external ear canals without cough reflex   NECK :  without JVD/Nodes/TM/ nl carotid upstrokes bilaterally   LUNGS: no acc muscle use,    bilateral mid exp wheezes diffusely    CV:  RRR  no s3 or murmur or increase in P2, no edema   ABD:  soft and nontender with nl excursion in the supine position. No bruits or organomegaly, bowel sounds nl  MS:  warm without deformities, calf tenderness, cyanosis or clubbing  SKIN: clear w/o rash   NEURO:  alert, approp, no deficits on motor/cerbellar testing, nl reflexes/ no nystagmus  CXR PA and Lateral:   11/25/2015 :    I personally reviewed images and agree with radiology impression as follows:   Right middle lobe collapse. This has been present since CT of 11/05/2014 and CT of the chest is recommended to exclude and endobronchial lesion              Assessment & Plan:

## 2015-12-29 NOTE — Assessment & Plan Note (Signed)
(  [name] not active)  -Xolair rx  07/2004 > 10/2006  - IgE  296 11/20/2013  - Prevnar rx 05/07/14  - 12/29/2015  extensive coaching HFA effectiveness =    75%    Acute flare in setting of rhinitis/ uri/ not using action plans at bottom of med calendar but already on maint rx with qvar 80 2bid and symbi 160 2 bid  rec doxy/ Prednisone 10 mg take  4 each am x 2 days,   2 each am x 2 days,  1 each am x 2 days and stop   For cough use mucinex dm 1200 mg every 12 hours plus flutter then tramadol prn  I had an extended discussion with the patient reviewing all relevant studies completed to date and  lasting 15 to 20 minutes of a 25 minute visit    Each maintenance medication was reviewed in detail including most importantly the difference between maintenance and prns and under what circumstances the prns are to be triggered using an action plan format that is not reflected in the computer generated alphabetically organized AVS but trather by a customized med calendar that reflects the AVS meds with confirmed 100% correlation.   Please see instructions for details which were reviewed in writing and the patient given a copy highlighting the part that I personally wrote and discussed at today's ov.

## 2015-12-29 NOTE — Patient Instructions (Signed)
Doxycycline 100 mg twice daily x 10 days  Prednisone 10 mg take  4 each am x 2 days,   2 each am x 2 days,  1 each am x 2 days and stop   For cough mucinex dm up to 1200 mg every 12 hours and flutter and if needed tramadol up to one every 4 hours   See Tammy NP w/in 4 weeks with all your medications, even over the counter meds, separated in two separate bags, the ones you take no matter what vs the ones you stop once you feel better and take only as needed when you feel you need them.   Tammy  will generate for you a new user friendly medication calendar that will put [place] all on the same page re: your medication use.     Without this process, it simply isn't possible to assure that we are providing  your outpatient care  with  the attention to detail we feel you deserve.   If we cannot assure that you're getting that kind of care,  then we cannot manage your problem effectively from this clinic.  Once you have seen Tammy and we are sure that we're all on the same page with your medication use she will arrange follow up with me.

## 2015-12-30 ENCOUNTER — Telehealth: Payer: Self-pay

## 2015-12-30 NOTE — Telephone Encounter (Signed)
Spoke with patient. Results given as seen below. Patient is agreeable. Aware she will need to keep follow up appointment for 01/02/2016.  Routing to provider for final review. Patient agreeable to disposition. Will close encounter.

## 2015-12-30 NOTE — Telephone Encounter (Signed)
-----   Message from Nunzio Cobbs, MD sent at 12/30/2015 10:56 AM EST ----- Please report to patient that office pathology specimen showed benign sebaceous cyst.  Cc- Marisa Sprinkles

## 2015-12-31 ENCOUNTER — Telehealth: Payer: Self-pay | Admitting: Obstetrics and Gynecology

## 2015-12-31 NOTE — Telephone Encounter (Signed)
I have reviewed this note.  Encounter closed.

## 2015-12-31 NOTE — Telephone Encounter (Signed)
Patient called and cancelled her appointment with Dr. Quincy Simmonds on 01/02/16 for a 10 day recheck. She said she will have to call back to reschedule.

## 2016-01-02 ENCOUNTER — Ambulatory Visit: Payer: Medicare Other | Admitting: Obstetrics and Gynecology

## 2016-01-07 ENCOUNTER — Encounter: Payer: Self-pay | Admitting: Obstetrics and Gynecology

## 2016-01-07 ENCOUNTER — Ambulatory Visit (INDEPENDENT_AMBULATORY_CARE_PROVIDER_SITE_OTHER): Payer: Medicare Other | Admitting: Obstetrics and Gynecology

## 2016-01-07 VITALS — BP 130/78 | HR 76 | Resp 16 | Ht 59.0 in | Wt 137.8 lb

## 2016-01-07 DIAGNOSIS — Z634 Disappearance and death of family member: Secondary | ICD-10-CM

## 2016-01-07 DIAGNOSIS — Z872 Personal history of diseases of the skin and subcutaneous tissue: Secondary | ICD-10-CM | POA: Diagnosis not present

## 2016-01-07 NOTE — Progress Notes (Signed)
Patient ID: Cassie Campbell, female   DOB: Nov 02, 1941, 75 y.o.   MRN: [id number] GYNECOLOGY  VISIT   HPI: 75 y.o.   Married  [place] American  female   [phone] with Patient's last menstrual period was 12/21/1999.   here for 1 week follow up. Patient is back on Plavix. Had excision of sebaceous cyst - benign pathology.  No significant bleeding after procedure.  Back on Plavix after 24 hours following procedure.   Lost her husband after 4 years of marriage.  He died just after her visit her to remove the cyst. Renal failure.  Prostate cancer.  She states she is coping well.  Feel relief that he is not suffering any more.  Has good support system.   GYNECOLOGIC HISTORY: Patient's last menstrual period was 12/21/1999. Contraception:Postmenopausal Menopausal hormone therapy: None Last mammogram: 11-27-15 3D/Density Cat.C/area questions on recent screening exam is no longer identified/Neg/BiRads2:[facility] Last pap smear: 10-21-09 Neg        OB History    Gravida Para Term Preterm AB TAB SAB Ectopic Multiple Living   _0 Patient Active Problem List   Diagnosis Date Noted  . CAD in native artery 10/28/2015  . Tinea cruris 07/20/2015  . Right bundle branch block 04/28/2015  . Pain in the chest   . Chest pain, atypical 11/06/2014  . HCAP (healthcare-associated pneumonia) 11/05/2014  . CAD (coronary artery disease)   . Chest pain 10/15/2014  . NSTEMI (non-ST elevated myocardial infarction) ([place]) 10/15/2014  . Health care maintenance 10/22/2013  . Arthralgia of hip 02/12/2013  . GERD (gastroesophageal reflux disease) 03/10/2012  . Intertriginous candidiasis 11/09/2011  . Atelectasis 02/24/2009  . BENIGN POSITIONAL VERTIGO 11/10/2007  . Hyperlipidemia with target LDL less than 70 10/05/2007  . Essential hypertension 10/05/2007  . ALLERGIC RHINITIS 10/05/2007  . Moderate persistent asthma in adult without complication 70/12/7492  . Disorder of bone and cartilage  10/05/2007    Past Medical History  Diagnosis Date  . Benign positional vertigo   . Osteopenia     dexa 08/22/07 AP spine + 1.1, left femur -1.3, right femur -.8; dexa 10/06/09 +1.6, left femur =1.6, right femur -.  . Hypertension   . Hyperlipidemia     <130 ldl pos fm hx, bp  . Obesity   . Asthma     xolair s 8/05 ?11/07; mastered hfa 12/20/08  . Allergic rhinitis   . Ruptured disc, cervical   . Depression   . PONV (postoperative nausea and vomiting)   . CAD (coronary artery disease)     a. 09/2014 NSTEMI s/p LHC with sig 2V dz. dLAD diffusely diseased and not suitable for PCI. unsuccessful RCA PCI d/t heavy calcifications  . Heart attack ([place])     09/2014    Past Surgical History  Procedure Laterality Date  . Cervical disc surgery  12/01  . Vein ligation and stripping    . Vulva /perineum biopsy  12-30-10    --epidermoid cyst  . Breast surgery  10-26-10    Rt. breast bx--for microcalcifications in rt. retroareolar region--dx was hyalinized fibroadenoma  . Bunionectomy Bilateral   . Left heart catheterization with coronary angiogram N/A 10/16/2014    Procedure: LEFT HEART CATHETERIZATION WITH CORONARY ANGIOGRAM;  Surgeon: Blane Ohara, MD;  Location[facility] CATH LAB;  Service: Cardiovascular;  Laterality: N/A;  . Percutaneous coronary rotoblator intervention (pci-r) N/A 10/17/2014    Procedure:  PERCUTANEOUS CORONARY ROTOBLATOR INTERVENTION (PCI-R);  Surgeon: Troy Sine, MD;  Location[facility] CATH LAB;  Service: Cardiovascular;  Laterality: N/A;  . Video bronchoscopy Bilateral 02/05/2015    Procedure: VIDEO BRONCHOSCOPY WITHOUT FLUORO;  Surgeon: Tanda Rockers, MD;  Location: [facility] ENDOSCOPY;  Service: Endoscopy;  Laterality: Bilateral;    Current Outpatient Prescriptions  Medication Sig Dispense Refill  . albuterol (PROAIR HFA) 108 (90 BASE) MCG/ACT inhaler Inhale 1-2 puffs into the lungs every 4 (four) hours as needed for wheezing. 1-2 every 4-6 hours as needed for wheezing 18 g 5   . amLODipine (NORVASC) 10 MG tablet take 1 tablet by mouth once daily 90 tablet 1  . atorvastatin (LIPITOR) 80 MG tablet Take 1 tablet (80 mg total) by mouth daily at 6 PM. NEED OV. 30 tablet 2  . beclomethasone (QVAR) 80 MCG/ACT inhaler Take 2 puffs first thing in am and then another 2 puffs about 12 hours later.    . bisoprolol (ZEBETA) 5 MG tablet Take 1 tablet by mouth every morning.   1  . Calcium Carbonate-Vit D-Min 1200-1000 MG-UNIT CHEW Chew 1 tablet by mouth every evening.     . cetirizine (ZYRTEC) 10 MG tablet Take 10 mg by mouth at bedtime as needed (itching and sneezing).     . clopidogrel (PLAVIX) 75 MG tablet take 1 tablet by mouth once daily 30 tablet 5  . clotrimazole-betamethasone (LOTRISONE) cream Apply 1 application topically 2 (two) times daily as needed (itching).     . Coenzyme Q10 200 MG capsule Take 200 mg by mouth daily.    Marland Kitchen dextromethorphan-guaiFENesin (MUCINEX DM) 30-600 MG per 12 hr tablet Take 1-2 tablets by mouth every 12 (twelve) hours as needed (cough).     . doxycycline (VIBRA-TABS) 100 MG tablet Take 1 tablet (100 mg total) by mouth 2 (two) times daily. 20 tablet 0  . famotidine (PEPCID) 20 MG tablet Take 20 mg by mouth at bedtime.     . fluticasone (FLONASE) 50 MCG/ACT nasal spray Place 2 sprays into both nostrils 2 (two) times daily.    . isosorbide mononitrate (IMDUR) 60 MG 24 hr tablet Take 1.5 tablets (24m) by mouth in the morning and 0.5 (32m tablet in the evening. 180 tablet 9  . meclizine (ANTIVERT) 25 MG tablet TAKE 2 TABLETS BY MOUTH  3 TIMES DAILY AS NEEDED (Patient taking differently: TAKE 1- 2 TABLETS BY MOUTH  EVERY 8 HOURS AS NEEDED FOR DIZZINESS) 24 tablet 5  . montelukast (SINGULAIR) 10 MG tablet Take 10 mg by mouth at bedtime.    . Multiple Vitamin (MULTIVITAMIN) capsule Take 1 capsule by mouth every morning.     . Marland Kitchenmeprazole (PRILOSEC) 20 MG capsule Take 20 mg by mouth daily before breakfast.     . oxymetazoline (AFRIN) 0.05 % nasal spray 2  puffs every 12 hours for 5 days as needed for stuffy nose and sinus problems    . predniSONE (DELTASONE) 10 MG tablet Take  4 each am x 2 days,   2 each am x 2 days,  1 each am x 2 days and stop 14 tablet 0  . ranolazine (RANEXA) 500 MG 12 hr tablet Take 1 tablet (500 mg total) by mouth 2 (two) times daily. 60 tablet 6  . sodium chloride (OCEAN) 0.65 % nasal spray Place 2 sprays into the nose every 4 (four) hours as needed (nasal congestion).     . SYMBICORT 160-4.5 MCG/ACT inhaler inhale 2 puffs every 12 hours 10.2  Inhaler 2  . traMADol (ULTRAM) 50 MG tablet Take 1 tablet (50 mg total) by mouth every 4 (four) hours as needed (severe cough or pain). 40 tablet 0   No current facility-administered medications for this visit.     ALLERGIES: Codeine phosphate; Aspirin; Brilinta; Diphenhydramine hcl; Fish-derived products; and Penicillins  Family History  Problem Relation Age of Onset  . Diabetes Mother     AODM  . Stroke Mother   . Hypertension Mother   . Heart disease Father   . Breast cancer Daughter 36    dec--mets to liver/spine  . Diabetes Sister     AODM  . Breast cancer Sister   . Hypertension Sister     Social History   Social History  . Marital Status: Married    Spouse Name: N/A  . Number of Children: N/A  . Years of Education: N/A   Occupational History  . retired Tour manager    Social History Main Topics  . Smoking status: Never Smoker   . Smokeless tobacco: Never Used  . Alcohol Use: No  . Drug Use: No  . Sexual Activity: No   Other Topics Concern  . Not on file   Social History Narrative    ROS:  Pertinent items are noted in HPI.  PHYSICAL EXAMINATION:    BP 130/78 mmHg  Pulse 76  Resp 16  Ht _0  (1.499 m)  Wt 137 lb 12.8 oz (62.506 kg)  BMI 27.82 kg/m2  LMP 12/21/1999    General appearance: alert, cooperative and appears stated age   Pelvic: External genitalia:  no lesions.  2 Vicryl sutures in left superior labia majora.   Healthy tissue.               Sutures removed with suture removal kit.  Chaperone was present for exam.  ASSESSMENT  Status post excision of sebaceous cyst.  Doing well.  Bereavement.   PLAN  Discussion of sebaceous cysts.  Bereavement support given and a hug, too! Follow up for annual exam and prn.    An After Visit Summary was printed and given to the patient.  __15____ minutes face to face time of which over 50% was spent in counseling.

## 2016-01-11 ENCOUNTER — Emergency Department (HOSPITAL_COMMUNITY): Payer: Medicare Other

## 2016-01-11 ENCOUNTER — Emergency Department (HOSPITAL_COMMUNITY)
Admission: EM | Admit: 2016-01-11 | Discharge: 2016-01-11 | Disposition: A | Discharge status: Home / Self Care | Payer: Medicare Other | Attending: Emergency Medicine | Admitting: Emergency Medicine

## 2016-01-11 ENCOUNTER — Encounter (HOSPITAL_COMMUNITY): Payer: Self-pay | Admitting: *Deleted

## 2016-01-11 DIAGNOSIS — Z792 Long term (current) use of antibiotics: Secondary | ICD-10-CM | POA: Diagnosis not present

## 2016-01-11 DIAGNOSIS — J45909 Unspecified asthma, uncomplicated: Secondary | ICD-10-CM | POA: Diagnosis not present

## 2016-01-11 DIAGNOSIS — E669 Obesity, unspecified: Secondary | ICD-10-CM | POA: Insufficient documentation

## 2016-01-11 DIAGNOSIS — Z7952 Long term (current) use of systemic steroids: Secondary | ICD-10-CM | POA: Diagnosis not present

## 2016-01-11 DIAGNOSIS — I2510 Atherosclerotic heart disease of native coronary artery without angina pectoris: Secondary | ICD-10-CM | POA: Diagnosis not present

## 2016-01-11 DIAGNOSIS — Z79899 Other long term (current) drug therapy: Secondary | ICD-10-CM | POA: Diagnosis not present

## 2016-01-11 DIAGNOSIS — Y939 Activity, unspecified: Secondary | ICD-10-CM | POA: Insufficient documentation

## 2016-01-11 DIAGNOSIS — I10 Essential (primary) hypertension: Secondary | ICD-10-CM | POA: Insufficient documentation

## 2016-01-11 DIAGNOSIS — X58XXXA Exposure to other specified factors, initial encounter: Secondary | ICD-10-CM | POA: Diagnosis not present

## 2016-01-11 DIAGNOSIS — Z7951 Long term (current) use of inhaled steroids: Secondary | ICD-10-CM | POA: Diagnosis not present

## 2016-01-11 DIAGNOSIS — Y929 Unspecified place or not applicable: Secondary | ICD-10-CM | POA: Insufficient documentation

## 2016-01-11 DIAGNOSIS — R079 Chest pain, unspecified: Secondary | ICD-10-CM | POA: Insufficient documentation

## 2016-01-11 DIAGNOSIS — S46911A Strain of unspecified muscle, fascia and tendon at shoulder and upper arm level, right arm, initial encounter: Secondary | ICD-10-CM | POA: Insufficient documentation

## 2016-01-11 DIAGNOSIS — I509 Heart failure, unspecified: Secondary | ICD-10-CM | POA: Diagnosis not present

## 2016-01-11 DIAGNOSIS — Z9889 Other specified postprocedural states: Secondary | ICD-10-CM | POA: Diagnosis not present

## 2016-01-11 DIAGNOSIS — M8580 Other specified disorders of bone density and structure, unspecified site: Secondary | ICD-10-CM | POA: Diagnosis not present

## 2016-01-11 DIAGNOSIS — T148XXA Other injury of unspecified body region, initial encounter: Secondary | ICD-10-CM

## 2016-01-11 DIAGNOSIS — Z880 Allergy status to penicillin: Secondary | ICD-10-CM | POA: Diagnosis not present

## 2016-01-11 DIAGNOSIS — Y999 Unspecified external cause status: Secondary | ICD-10-CM | POA: Diagnosis not present

## 2016-01-11 DIAGNOSIS — E785 Hyperlipidemia, unspecified: Secondary | ICD-10-CM | POA: Insufficient documentation

## 2016-01-11 DIAGNOSIS — H8110 Benign paroxysmal vertigo, unspecified ear: Secondary | ICD-10-CM | POA: Insufficient documentation

## 2016-01-11 LAB — BASIC METABOLIC PANEL
Anion gap: 13 (ref 5–15)
CO2: 24 mmol/L (ref 22–32)
Calcium: 9.3 mg/dL (ref 8.9–10.3)
Chloride: 105 mmol/L (ref 101–111)
Creatinine, Ser: 0.66 mg/dL (ref 0.44–1.00)
GFR calc Af Amer: >60 mL/min (ref >60)
GLUCOSE: 109 mg/dL — AB (ref 65–99)
POTASSIUM: 3.3 mmol/L — AB (ref 3.5–5.1)
Sodium: 142 mmol/L (ref 135–145)

## 2016-01-11 LAB — CBC
HEMATOCRIT: 37.6 % (ref 36.0–46.0)
Hemoglobin: 12.8 g/dL (ref 12.0–15.0)
MCH: 30.3 pg (ref 26.0–34.0)
MCHC: 34 g/dL (ref 30.0–36.0)
MCV: 89.1 fL (ref 78.0–100.0)
Platelets: 442 10*3/uL — ABNORMAL HIGH (ref 150–400)
RBC: 4.22 MIL/uL (ref 3.87–5.11)
RDW: 16.2 % — AB (ref 11.5–15.5)
WBC: 7.6 10*3/uL (ref 4.0–10.5)

## 2016-01-11 LAB — TROPONIN I: Troponin I: <0.03 ng/mL (ref <0.031)

## 2016-01-11 MED ORDER — METHOCARBAMOL 500 MG PO TABS: 500.0000 mg | ORAL_TABLET | Freq: Two times a day (BID) | ORAL | Status: DC

## 2016-01-11 MED ORDER — METHOCARBAMOL 500 MG PO TABS
500.0000 mg | ORAL_TABLET | Freq: Once | ORAL | Status: AC
  Administered 2016-01-11: 500 mg via ORAL
  Filled 2016-01-11: qty 1

## 2016-01-11 MED ORDER — HYDROCODONE-ACETAMINOPHEN 5-325 MG PO TABS
1.0000 | ORAL_TABLET | Freq: Once | ORAL | Status: AC
  Administered 2016-01-11: 1 via ORAL
  Filled 2016-01-11: qty 1

## 2016-01-11 NOTE — Discharge Instructions (Signed)
Tylenol or Motrin for pain. Try some heating pads. Take Robaxin as prescribed as needed for spasms. Was lifting any heavy objects with right arm. Follow-up with primary care doctor and cardiology.   Chest Wall Pain Chest wall pain is pain in or around the bones and muscles of your chest. Sometimes, an injury causes this pain. Sometimes, the cause may not be known. This pain may take several weeks or longer to get better. HOME CARE INSTRUCTIONS  Pay attention to any changes in your symptoms. Take these actions to help with your pain:   Rest as told by your health care provider.   Avoid activities that cause pain. These include any activities that use your chest muscles or your abdominal and side muscles to lift heavy items.   If directed, apply ice to the painful area:  Put ice in a plastic bag.  Place a towel between your skin and the bag.  Leave the ice on for 20 minutes, 2-3 times per day.  Take over-the-counter and prescription medicines only as told by your health care provider.  Do not use tobacco products, including cigarettes, chewing tobacco, and e-cigarettes. If you need help quitting, ask your health care provider.  Keep all follow-up visits as told by your health care provider. This is important. SEEK MEDICAL CARE IF:  You have a fever.  Your chest pain becomes worse.  You have new symptoms. SEEK IMMEDIATE MEDICAL CARE IF:  You have nausea or vomiting.  You feel sweaty or light-headed.  You have a cough with phlegm (sputum) or you cough up blood.  You develop shortness of breath.   This information is not intended to replace advice given to you by your health care provider. Make sure you discuss any questions you have with your health care provider.   Document Released: 12/06/2005 Document Revised: 08/27/2015 Document Reviewed: 03/03/2015 Elsevier Interactive Patient Education [employer].

## 2016-01-11 NOTE — ED Notes (Signed)
Patient presents stating she has had pain to the right chest for about 2 days.  Travels into her back and shoulder  Similar to what she had in the past

## 2016-01-11 NOTE — ED Provider Notes (Signed)
CSN: [id number]     Arrival date & time 01/11/16  [id number] History   First MD Initiated Contact with Patient 01/11/16 [phone]     Chief Complaint  Patient presents with  . Chest Pain     (Consider location/radiation/quality/duration/timing/severity/associated sxs/prior Treatment) HPI Cassie Campbell is a 75 y.o. female with hx of HTN, asthma, CAD, presents to ED with complaint of right sided chest pain. Patient states her pain started 3 days ago. She states it is in the right lower chest and radiates into the right scapular area of her back. She denies any injuries. She states pain is dull and constant, but is worse when she moves. She denies any shortness of breath, dizziness, nausea, diaphoresis. She denies any pain with deep breathing or coughing. She states she has had some coughing for the last month, but states it is improving. She went to her primary care doctor who gave her "some medicine." She denies any left-sided chest pain. She states this does not feel like her prior MI. She has tried taking some Tylenol which she states does not help. She denies any exertional symptoms. She denies any pain that is worse after eating. No abdominal pain. She has no swelling of extremities. No calf pain or tenderness. No recent travel or surgeries.=  Past Medical History  Diagnosis Date  . Benign positional vertigo   . Osteopenia     dexa 08/22/07 AP spine + 1.1, left femur -1.3, right femur -.8; dexa 10/06/09 +1.6, left femur =1.6, right femur -.  . Hypertension   . Hyperlipidemia     <130 ldl pos fm hx, bp  . Obesity   . Asthma     xolair s 8/05 ?11/07; mastered hfa 12/20/08  . Allergic rhinitis   . Ruptured disc, cervical   . Depression   . PONV (postoperative nausea and vomiting)   . CAD (coronary artery disease)     a. 09/2014 NSTEMI s/p LHC with sig 2V dz. dLAD diffusely diseased and not suitable for PCI. unsuccessful RCA PCI d/t heavy calcifications  . Heart attack ([place])     09/2014   Past  Surgical History  Procedure Laterality Date  . Cervical disc surgery  12/01  . Vein ligation and stripping    . Vulva /perineum biopsy  12-30-10    --epidermoid cyst  . Breast surgery  10-26-10    Rt. breast bx--for microcalcifications in rt. retroareolar region--dx was hyalinized fibroadenoma  . Bunionectomy Bilateral   . Left heart catheterization with coronary angiogram N/A 10/16/2014    Procedure: LEFT HEART CATHETERIZATION WITH CORONARY ANGIOGRAM;  Surgeon: Blane Ohara, MD;  Location: [facility] CATH LAB;  Service: Cardiovascular;  Laterality: N/A;  . Percutaneous coronary rotoblator intervention (pci-r) N/A 10/17/2014    Procedure: PERCUTANEOUS CORONARY ROTOBLATOR INTERVENTION (PCI-R);  Surgeon: Troy Sine, MD;  Location: [facility] CATH LAB;  Service: Cardiovascular;  Laterality: N/A;  . Video bronchoscopy Bilateral 02/05/2015    Procedure: VIDEO BRONCHOSCOPY WITHOUT FLUORO;  Surgeon: Tanda Rockers, MD;  Location: [facility] ENDOSCOPY;  Service: Endoscopy;  Laterality: Bilateral;   Family History  Problem Relation Age of Onset  . Diabetes Mother     AODM  . Stroke Mother   . Hypertension Mother   . Heart disease Father   . Breast cancer Daughter 36    dec--mets to liver/spine  . Diabetes Sister     AODM  . Breast cancer Sister   . Hypertension Sister    Social History  Substance Use Topics  . Smoking status: Never Smoker   . Smokeless tobacco: Never Used  . Alcohol Use: No   OB History    Gravida Para Term Preterm AB TAB SAB Ectopic Multiple Living   2 1 1  1  1   1      Review of Systems  Constitutional: Negative for fever and chills.  Respiratory: Positive for chest tightness. Negative for cough and shortness of breath.   Cardiovascular: Positive for chest pain. Negative for palpitations and leg swelling.  Gastrointestinal: Negative for nausea, vomiting, abdominal pain and diarrhea.  Musculoskeletal: Negative for myalgias, arthralgias, neck pain and neck stiffness.  Skin:  Negative for rash.  Neurological: Negative for dizziness, weakness and headaches.  All other systems reviewed and are negative.     Allergies  Codeine phosphate; Aspirin; Brilinta; Diphenhydramine hcl; Fish-derived products; and Penicillins  Home Medications   Prior to Admission medications   Medication Sig Start Date End Date Taking? Authorizing Provider  albuterol (PROAIR HFA) 108 (90 BASE) MCG/ACT inhaler Inhale 1-2 puffs into the lungs every 4 (four) hours as needed for wheezing. 1-2 every 4-6 hours as needed for wheezing 11/10/15   Tanda Rockers, MD  amLODipine (NORVASC) 10 MG tablet take 1 tablet by mouth once daily 11/17/15   Tanda Rockers, MD  atorvastatin (LIPITOR) 80 MG tablet Take 1 tablet (80 mg total) by mouth daily at 6 PM. NEED OV. 07/21/15   Troy Sine, MD  beclomethasone (QVAR) 80 MCG/ACT inhaler Take 2 puffs first thing in am and then another 2 puffs about 12 hours later. 11/25/15   Tanda Rockers, MD  bisoprolol (ZEBETA) 5 MG tablet Take 1 tablet by mouth every morning.  02/08/15   Historical Provider, MD  Calcium Carbonate-Vit D-Min 1200-1000 MG-UNIT CHEW Chew 1 tablet by mouth every evening.     Historical Provider, MD  cetirizine (ZYRTEC) 10 MG tablet Take 10 mg by mouth at bedtime as needed (itching and sneezing).     Historical Provider, MD  clopidogrel (PLAVIX) 75 MG tablet take 1 tablet by mouth once daily 12/16/15   Troy Sine, MD  clotrimazole-betamethasone (LOTRISONE) cream Apply 1 application topically 2 (two) times daily as needed (itching).     Historical Provider, MD  Coenzyme Q10 200 MG capsule Take 200 mg by mouth daily.    Historical Provider, MD  dextromethorphan-guaiFENesin (MUCINEX DM) 30-600 MG per 12 hr tablet Take 1-2 tablets by mouth every 12 (twelve) hours as needed (cough).     Historical Provider, MD  doxycycline (VIBRA-TABS) 100 MG tablet Take 1 tablet (100 mg total) by mouth 2 (two) times daily. 12/29/15   Tanda Rockers, MD  famotidine  (PEPCID) 20 MG tablet Take 20 mg by mouth at bedtime.     Historical Provider, MD  fluticasone (FLONASE) 50 MCG/ACT nasal spray Place 2 sprays into both nostrils 2 (two) times daily.    Historical Provider, MD  isosorbide mononitrate (IMDUR) 60 MG 24 hr tablet Take 1.5 tablets (90mg ) by mouth in the morning and 0.5 (30mg ) tablet in the evening. 08/06/15   Troy Sine, MD  meclizine (ANTIVERT) 25 MG tablet TAKE 2 TABLETS BY MOUTH  3 TIMES DAILY AS NEEDED Patient taking differently: TAKE 1- 2 TABLETS BY MOUTH  EVERY 8 HOURS AS NEEDED FOR DIZZINESS 02/10/15   Tanda Rockers, MD  montelukast (SINGULAIR) 10 MG tablet Take 10 mg by mouth at bedtime.    Historical Provider, MD  Multiple Vitamin (MULTIVITAMIN) capsule Take 1 capsule by mouth every morning.     Historical Provider, MD  omeprazole (PRILOSEC) 20 MG capsule Take 20 mg by mouth daily before breakfast.     Historical Provider, MD  oxymetazoline (AFRIN) 0.05 % nasal spray 2 puffs every 12 hours for 5 days as needed for stuffy nose and sinus problems    Historical Provider, MD  predniSONE (DELTASONE) 10 MG tablet Take  4 each am x 2 days,   2 each am x 2 days,  1 each am x 2 days and stop 12/29/15   Tanda Rockers, MD  ranolazine (RANEXA) 500 MG 12 hr tablet Take 1 tablet (500 mg total) by mouth 2 (two) times daily. 10/27/15   Troy Sine, MD  sodium chloride (OCEAN) 0.65 % nasal spray Place 2 sprays into the nose every 4 (four) hours as needed (nasal congestion).     Historical Provider, MD  SYMBICORT 160-4.5 MCG/ACT inhaler inhale 2 puffs every 12 hours 12/23/15   Tanda Rockers, MD  traMADol (ULTRAM) 50 MG tablet Take 1 tablet (50 mg total) by mouth every 4 (four) hours as needed (severe cough or pain). 12/29/15   Tanda Rockers, MD   BP 132/81 mmHg  Pulse 88  Temp(Src) 99.2 F (37.3 C) (Oral)  Resp 18  Wt 62.143 kg  SpO2 96%  LMP 12/21/1999 Physical Exam  Constitutional: She is oriented to person, place, and time. She appears well-developed  and well-nourished. No distress.  HENT:  Head: Normocephalic.  Eyes: Conjunctivae are normal.  Neck: Neck supple.  Cardiovascular: Normal rate, regular rhythm and normal heart sounds.   Pulmonary/Chest: Effort normal and breath sounds normal. No respiratory distress. She has no wheezes. She has no rales. She exhibits tenderness.  ttp over right mid chest. No breast abnormality or tenderness. No rashes  Abdominal: Soft. Bowel sounds are normal. She exhibits no distension. There is no tenderness. There is no rebound.  Musculoskeletal: She exhibits no edema.  TTP over periscapular muscles. No swelling, erythema, rashes  Neurological: She is alert and oriented to person, place, and time.  Skin: Skin is warm and dry.  Psychiatric: She has a normal mood and affect. Her behavior is normal.  Nursing note and vitals reviewed.   ED Course  Procedures (including critical care time) Labs Review Labs Reviewed  BASIC METABOLIC PANEL - Abnormal; Notable for the following:    Potassium 3.3 (*)    Glucose, Bld 109 (*)    BUN <5 (*)    All other components within normal limits  CBC - Abnormal; Notable for the following:    RDW 16.2 (*)    Platelets 442 (*)    All other components within normal limits  TROPONIN I    Imaging Review Dg Chest 2 View  01/11/2016  CLINICAL DATA:  75 year old female with chest pain EXAM: CHEST  2 VIEW COMPARISON:  Radiograph dated 11/25/2015 FINDINGS: There is stable appearance of the right middle lobe collapse as linear opacity on the lateral projection. Underlying mass is not excluded. Clinical correlation and follow-up recommended. The lungs are otherwise clear. No pleural effusion or pneumothorax. Stable cardiac silhouette. Cervical spine fixation plate and screws. No acute fracture. 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 No acute cardiopulmonary process. Electronically Signed   By: Anner Crete M.D.   On: 01/11/2016 07:16   I have  personally reviewed and evaluated these images and lab results as part of my  medical decision-making.   EKG Interpretation   Date/Time:  Sunday January 11 2016 06:14:45 EST Ventricular Rate:  80 PR Interval:  126 QRS Duration: 126 QT Interval:  410 QTC Calculation: 472 R Axis:   -77 Text Interpretation:  Normal sinus rhythm Left axis deviation Right bundle  branch block Possible Lateral infarct , age undetermined Abnormal ECG When  compared with ECG of 11/05/2014, Right bundle branch block is now Present  Confirmed by [facility]  MD, DAVID ([id number]) on 01/11/2016 6:29:50 AM      MDM   Final diagnoses:  Chest pain, unspecified chest pain type  Muscle strain    patient with history of MI, unsuccessful stenting in 2015, presents to emergency room complaining of atypical right-sided chest pain and right parascapular area pain. Pain is reproducible with palpation and movement. She denies any associated symptoms including shortness of breath. We will check labs and chest x-ray.  Patient's labs and chest x-ray unremarkable. Pain most likely muscular. Discussed with Dr. Rex Kras who has seen pt and agrees. Will monitor and repeat troponin.   Filed Vitals:   01/11/16 1215 01/11/16 1230 01/11/16 1245 01/11/16 1315  BP: 131/72 134/72 131/67 138/89  Pulse: 70 72 75 73  Temp:      TempSrc:      Resp: 17 13 17 14   Weight:      SpO2: 100% 97% 97% 98%    Repeat trop negative. Pt comfortable going home. Will dc home and have pt follow up closely with cardiology.   Filed Vitals:   01/11/16 1215 01/11/16 1230 01/11/16 1245 01/11/16 1315  BP: 131/72 134/72 131/67 138/89  Pulse: 70 72 75 73  Temp:      TempSrc:      Resp: 17 13 17 14   Weight:      SpO2: 100% 97% 97% 98%     Jeannett Senior, PA-C 01/11/16 [place], MD 01/11/16 1646

## 2016-01-22 ENCOUNTER — Encounter: Payer: Self-pay | Admitting: Physician Assistant

## 2016-01-22 ENCOUNTER — Ambulatory Visit (INDEPENDENT_AMBULATORY_CARE_PROVIDER_SITE_OTHER): Payer: Medicare Other | Admitting: Physician Assistant

## 2016-01-22 VITALS — BP 120/74 | HR 84 | Ht 59.0 in | Wt 139.1 lb

## 2016-01-22 DIAGNOSIS — S46919A Strain of unspecified muscle, fascia and tendon at shoulder and upper arm level, unspecified arm, initial encounter: Secondary | ICD-10-CM | POA: Insufficient documentation

## 2016-01-22 DIAGNOSIS — S46911A Strain of unspecified muscle, fascia and tendon at shoulder and upper arm level, right arm, initial encounter: Secondary | ICD-10-CM

## 2016-01-22 DIAGNOSIS — I10 Essential (primary) hypertension: Secondary | ICD-10-CM

## 2016-01-22 NOTE — Progress Notes (Signed)
Patient ID: Cassie Campbell, female   DOB: [birth year], 75 y.o.   MRN: [id number]    Date:  01/22/2016   ID:  Cassie Campbell, DOB 1941/12/15, MRN [id number]  PCP:  Christinia Gully, MD  Primary Cardiologist:  Claiborne Billings  Chief Complaint  Patient presents with  . Hospitalization Follow-up    no chest pain, no shortness of breath, no edema, no pain or cramping in legs, no lightheadedness or dizziness     History of Present Illness: Cassie Campbell is a 75 y.o. female   Cassie Campbell has a history of complex CAD and underwent catheterization by Dr. Fletcher Anon with class IV symptoms on 10/16/2014. She was found to have diffusely diseased LAD, which was not suitable for revascularization. She had ruled in for non-ST segment elevation myocardial infarction, felt to be due to a severely calcified almost subtotal RCA with severe diffuse calcified disease. Initial attempt at PCI by Dr. Fletcher Anon was unsuccessful due to the severe calcification.  She underwent unsuccessful high-speed rotational atherectomy.  She was sent home on increased medication regimen with potential plans for possible follow-up evaluation depending upon symptom status.  Ranolazine was added initially at 500 mg twice a day  And titrated to 1000 mg twice a day, isosorbide mononitrate which was increased to 90 mg in the morning  30 mg bedtime, amlodipine, and Lopressor 37.5 twice a day. She has been treated with aspirin and Brilinta for dual antiplatelet therapy.  She has a history of asthma and is on Symbicort 160-4.5 and Singulair. She has a history of hyperlipidemia, currently on Lipitor 80 mg. She does have GERD for which he takes Pepcid as well as omeprazole. She developed some increased wheezing on Lopressor and was changed to bisoprolol 5 mg in place of metoprolol. She has seen Dr. Melvyn Novas for her severe chronic asthma and in the past. She also was found to have marked atopy with significant elevation of IgG E levels. She feels that her  breathing has improved with the changed to bisoprolol from Lopressor.     last lipid panel was November 2016  Lipid Panel     Component Value Date/Time   CHOL 182 10/30/2015 0953   TRIG 113 10/30/2015 0953   TRIG 85 10/05/2006 1022   HDL 56 10/30/2015 0953   CHOLHDL 3.3 10/30/2015 0953   CHOLHDL 4.3 CALC 10/05/2006 1022   VLDL 23 10/30/2015 0953   LDLCALC 103 10/30/2015 0953      Cassie Campbell was seen in the emergency room  01/11/2016 with right-sided chest and scapular pain which was reproducible with palpation.   Point was negative 2.   He was mildly hypokalemic.  Labs were otherwise stable.   She presents today for a post ER evaluation. She was to make sure that the pain is not related to her heart.  She's been putting warm compresses on it with some relief. Just reports when she lays on that side helps. She denies nausea, vomiting, fever, anginal chest pain, shortness of breath, orthopnea, dizziness, PND, cough, congestion, abdominal pain, hematochezia, melena, lower extremity edema, claudication.  Wt Readings from Last 3 Encounters:  01/22/16 139 lb 2 oz (63.107 kg)  01/11/16 137 lb (62.143 kg)  01/07/16 137 lb 12.8 oz (62.506 kg)     Past Medical History  Diagnosis Date  . Benign positional vertigo   . Osteopenia     dexa 08/22/07 AP spine + 1.1, left femur -1.3, right femur -.8; dexa 10/06/09 +1.6, left femur =1.6,  right femur -.  . Hypertension   . Hyperlipidemia     <130 ldl pos fm hx, bp  . Obesity   . Asthma     xolair s 8/05 ?11/07; mastered hfa 12/20/08  . Allergic rhinitis   . Ruptured disc, cervical   . Depression   . PONV (postoperative nausea and vomiting)   . CAD (coronary artery disease)     a. 09/2014 NSTEMI s/p LHC with sig 2V dz. dLAD diffusely diseased and not suitable for PCI. unsuccessful RCA PCI d/t heavy calcifications  . Heart attack ([place])     09/2014    Current Outpatient Prescriptions  Medication Sig Dispense Refill  . albuterol (PROAIR HFA)  108 (90 BASE) MCG/ACT inhaler Inhale 1-2 puffs into the lungs every 4 (four) hours as needed for wheezing. 1-2 every 4-6 hours as needed for wheezing 18 g 5  . amLODipine (NORVASC) 10 MG tablet take 1 tablet by mouth once daily 90 tablet 1  . atorvastatin (LIPITOR) 80 MG tablet Take 1 tablet (80 mg total) by mouth daily at 6 PM. NEED OV. 30 tablet 2  . beclomethasone (QVAR) 80 MCG/ACT inhaler Take 2 puffs first thing in am and then another 2 puffs about 12 hours later.    . bisoprolol (ZEBETA) 5 MG tablet Take 1 tablet by mouth every morning.   1  . Calcium Carbonate-Vit D-Min 1200-1000 MG-UNIT CHEW Chew 1 tablet by mouth every evening.     . cetirizine (ZYRTEC) 10 MG tablet Take 10 mg by mouth at bedtime as needed (itching and sneezing).     . clopidogrel (PLAVIX) 75 MG tablet take 1 tablet by mouth once daily 30 tablet 5  . clotrimazole-betamethasone (LOTRISONE) cream Apply 1 application topically 2 (two) times daily as needed (itching).     . Coenzyme Q10 200 MG capsule Take 200 mg by mouth daily.    Marland Kitchen dextromethorphan-guaiFENesin (MUCINEX DM) 30-600 MG per 12 hr tablet Take 1-2 tablets by mouth every 12 (twelve) hours as needed (cough).     . famotidine (PEPCID) 20 MG tablet Take 20 mg by mouth at bedtime.     . fluticasone (FLONASE) 50 MCG/ACT nasal spray Place 2 sprays into both nostrils 2 (two) times daily.    . isosorbide mononitrate (IMDUR) 60 MG 24 hr tablet Take 1.5 tablets (90mg ) by mouth in the morning and 0.5 (30mg ) tablet in the evening. 180 tablet 9  . meclizine (ANTIVERT) 25 MG tablet TAKE 2 TABLETS BY MOUTH  3 TIMES DAILY AS NEEDED (Patient taking differently: TAKE 1- 2 TABLETS BY MOUTH  EVERY 8 HOURS AS NEEDED FOR DIZZINESS) 24 tablet 5  . montelukast (SINGULAIR) 10 MG tablet Take 10 mg by mouth at bedtime.    . Multiple Vitamin (MULTIVITAMIN) capsule Take 1 capsule by mouth every morning.     Marland Kitchen omeprazole (PRILOSEC) 20 MG capsule Take 20 mg by mouth daily before breakfast.     .  oxymetazoline (AFRIN) 0.05 % nasal spray 2 puffs every 12 hours for 5 days as needed for stuffy nose and sinus problems    . ranolazine (RANEXA) 500 MG 12 hr tablet Take 1 tablet (500 mg total) by mouth 2 (two) times daily. 60 tablet 6  . sodium chloride (OCEAN) 0.65 % nasal spray Place 2 sprays into the nose every 4 (four) hours as needed (nasal congestion).     . SYMBICORT 160-4.5 MCG/ACT inhaler inhale 2 puffs every 12 hours 10.2 Inhaler 2  . traMADol (  ULTRAM) 50 MG tablet Take 1 tablet (50 mg total) by mouth every 4 (four) hours as needed (severe cough or pain). 40 tablet 0   No current facility-administered medications for this visit.    Allergies:    Allergies  Allergen Reactions  . Codeine Phosphate Nausea Only  . Aspirin Swelling and Rash  . Brilinta [Ticagrelor] Rash    Started on Ranexa & Brilinta 09/2014 - rash - unsure which it is directly related to  . Diphenhydramine Hcl Rash  . Fish-Derived Products Swelling and Rash  . Penicillins Swelling and Rash    Social History:  The patient  reports that she has never smoked. She has never used smokeless tobacco. She reports that she does not drink alcohol or use illicit drugs.    Family History  Problem Relation Age of Onset  . Diabetes Mother     AODM  . Stroke Mother   . Hypertension Mother   . Heart disease Father   . Breast cancer Daughter 36    dec--mets to liver/spine  . Diabetes Sister     AODM  . Breast cancer Sister   . Hypertension Sister     ROS:  Please see the history of present illness.  All other systems reviewed and negative.   PHYSICAL EXAM: VS:  BP 120/74 mmHg  Pulse 84  Ht 4\' 11"  (1.499 m)  Wt 139 lb 2 oz (63.107 kg)  BMI 28.08 kg/m2  LMP 12/21/1999 Well nourished, well developed, in no acute distress HEENT: Pupils are equal round react to light accommodation extraocular movements are intact.  Neck: no JVDNo cervical lymphadenopathy. Cardiac: Regular rate and rhythm without murmurs rubs or  gallops. Musculoskeletal:  She is very tender medial to the right scapula border. Lungs:  clear to auscultation bilaterally, no wheezing, rhonchi or rales Abd: soft, nontender, positive bowel sounds all quadrants, no hepatosplenomegaly Ext: no lower extremity edema.  2+ radial and dorsalis pedis pulses. Skin: warm and dry Neuro:  Grossly normal   ASSESSMENT AND PLAN:  Problem List Items Addressed This Visit    Muscle strain of scapular region   Essential hypertension - Primary     She is very tender along the medial right scapula border.  Warm compress seems to help.  She has a severe allergy to ASA and will not take NSAIDS.  She does have Tramadol at home which may help.  I reassured her that it was not her heart.  Blood pressures well-controlled.

## 2016-01-22 NOTE — Patient Instructions (Signed)
Medication Instructions:  Your physician recommends that you continue on your current medications as directed. Please refer to the Current Medication list given to you today.   Labwork: -None  Testing/Procedures: -None  Follow-Up: Your physician recommends that you keep your scheduled  follow-up appointment with Dr. Claiborne Billings   Any Other Special Instructions Will Be Listed Below (If Applicable).     If you need a refill on your cardiac medications before your next appointment, please call your pharmacy.

## 2016-01-26 ENCOUNTER — Ambulatory Visit (INDEPENDENT_AMBULATORY_CARE_PROVIDER_SITE_OTHER): Payer: Medicare Other | Admitting: Adult Health

## 2016-01-26 ENCOUNTER — Encounter: Payer: Self-pay | Admitting: Adult Health

## 2016-01-26 VITALS — BP 124/72 | HR 70 | Temp 98.7°F | Ht 59.0 in | Wt 140.0 lb

## 2016-01-26 DIAGNOSIS — J4540 Moderate persistent asthma, uncomplicated: Secondary | ICD-10-CM | POA: Diagnosis not present

## 2016-01-26 DIAGNOSIS — E785 Hyperlipidemia, unspecified: Secondary | ICD-10-CM | POA: Diagnosis not present

## 2016-01-26 DIAGNOSIS — I10 Essential (primary) hypertension: Secondary | ICD-10-CM

## 2016-01-26 MED ORDER — BECLOMETHASONE DIPROPIONATE 80 MCG/ACT IN AERS: 2.0000 | INHALATION_SPRAY | Freq: Two times a day (BID) | RESPIRATORY_TRACT | Status: DC

## 2016-01-26 NOTE — Progress Notes (Signed)
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 Follow in pulmonary clinic also for primary care with hbp/ hyperlipidemia complicated by MI Q000111Q and persistent mucus plug in RML/ RLL s/p fob 02/05/15 with marked improvement p lavage and no endobrchial lesions, just diffuse airway swelling      History of Present Illness   10/14/13 [name] > laser rx for back pain  rad legs > resolved    Admit Date: 10/15/2014 Discharge date: 10/19/2014    PRIMARY DISCHARGE DIAGNOSIS:  NSTEMI (non-ST elevated myocardial infarction)   Essential hypertension  GERD (gastroesophageal reflux disease)  Chest pain  Hyperlipidemia  Hypertension  CAD (coronary artery disease)  11/28/2014 acute ov/[name] re: asthma flare despite qvar and symbicort max doses  Chief Complaint  Patient presents with  . Acute Visit    Pt c/o increased wheezing over the past month- esp worse at night.    Wheezing worse at 3 am, sits up and better p saba  > back to sleep,  Onset was indolent and increased as lopressor dose was increased  >>d/c lopressor , rx bisoprolol , pred taper   01/28/2015 Follow up and Med Review    Patient returns for 6 week follow-up and medication review We reviewed all her medications organized them into a medication calendar with patient education . Pt has had 2 hospitalization over last 4 months with NSTEMI in  Oct and HCAP in Nov.  She is starting to feel better , getting back to her baseline.  Last office visit was changed from Lopressor to bisoprolol. Due to wheezing Patient says that she is improved with resolution of wheezing. Chest x-ray last visit showed a persistent right middle and right lower lobe infiltrate. She denies any hemoptysis, unintentional  weight loss, chest pain, orthopnea, PND, leg swelling, or syncope. She is being followed by cardiology for her underlying coronary artery disease. 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 Annual Exam    Pt is fasting. She c/o increased wheezing for the past month. She is using albuterol inhaler approx 4 x per day.   Dr Claiborne Billings following lipids.  Since changed qvar to flovent (insurance restriction) more dep on symbicort 160 2 bid and prn saba for chest tightness/ subj wheeze better always p saba rx  rec Change flovent to qvar 80 Take 2 puffs first thing in am and then another 2 puffs about 12 hours later.  Prednisone 10 mg take  4 each am x 2 days,   2 each am x 2 days,  1 each am x 2 days and stop      12/29/2015 acute extended ov/[name] re: cough to point of ribs sore on qvar 80 2bid Chief Complaint  Patient presents with  . Acute Visit    Pt c/o fever, cough, soreness in ribs x 2-3 wks. Cough is prod with yellow sputum. She also c/o wheezing and increased SOB- using rescue inhaler approx 6 x per day.    no fever since  5 days prior to OV  but mucus is thick and yellow  / rib soreness is gen bilaterally  Cough worse at hs / not using med calendar action plan aggressively / no using tramadol / poor hfa  >>doxycycline and pred taper .   01/26/2016 Follow up : Chronic Asthma/HTN/CAD and Dysplipidemia  Pt returns for 1 month follow up  We reviewed all her meds and organized them into a med calendar with pt educaiton.  Appears to be taking correctly   Pt was seen in ER recent with shoulder and back pain felt to MSK related with neg tropoinin/EKG.  Follow up with cardiology w/ no changes.   Seen 1 month ago with bronchitis , tx w/ abx and steroids . Much better but still has cough at night mainly .  Tramadol  helps. Wheezing resolved. No chest pain, orthopnea edema or fever. R CXR showed stable RML changes- previous workup with FOB in 01/2015 was neg . Felt to represent scarring /atx after a PNA.  Denies hemoptysis .        Current Medications, Allergies, Complete Past Medical History, Past Surgical History, Family History, and Social History were reviewed in [employer] record.  ROS  The following are not active complaints unless bolded sore throat, dysphagia, dental problems, itching, sneezing,  nasal congestion or excess/ purulent secretions, ear ache,   fever, chills, sweats, unintended wt loss, classically pleuritic or exertional cp, hemoptysis,  orthopnea pnd or leg swelling, presyncope, palpitations, abdominal pain, anorexia, nausea, vomiting, diarrhea  or change in bowel or bladder habits, change in stools or urine, dysuria,hematuria,  rash, arthralgias, visual complaints, headache, numbness, weakness or ataxia or problems with walking or coordination,  change in mood/affect or memory.                 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 [employer]...........................................................................Marland KitchenWert  - Td 05/20/2015  - Pneumovax 09/2003 and October 27, 2009   -Prevnar 2015 - Colonscopy 10/28/2004  - CPX 11/19/13     - GYN per [name]'s office -Mammogram 2013 , 2014  2016  MED CALENDAR 05/05/11 , 05/16/2013 , 08/06/2014 , 01/28/2015 , 08/26/2015 01/26/2016        Objective:   Physical Exam  Wt 143 11/09/2011 >  04/12/2012  140 > 06/26/2012  139 >  11/13/2012 135 >  137 02/12/2013 >141 05/16/2013 > 07/31/2013  139 >  11/19/2013  134 >135 02/18/2014 > 04/16/2014 134 > 05/07/2014  134 >131 08/06/2014 > 10/30/2014 130 > 11/28/2014 128 > 02/18/2015   133 > 05/20/2015 133 > 07/14/2015   135 >137 08/26/2015 > 11/25/2015 143 > 12/29/2015  137 >140.01/26/2016  Filed Vitals:   01/26/16 0859  BP: 124/72  Pulse: 70  Temp: 98.7 F (37.1 C)  TempSrc: Oral  Height: 4\' 11"  (1.499 m)  Weight: 140 lb (63.504 kg)  SpO2: 95%     Pleasant amb bf nad/ vital signs reviewed  HEENT: top dentures, nl  turbinates, and orophanx. Nl external ear canals without cough reflex   NECK :  without JVD/Nodes/TM/ nl carotid upstrokes bilaterally   LUNGS: no acc muscle use,    CTA    CV:  RRR  no s3 or murmur or increase in P2, no edema   ABD:  soft and nontender with nl excursion in the supine position. No bruits or organomegaly, bowel sounds nl  MS:  warm without deformities, calf tenderness, cyanosis or clubbing  SKIN: clear w/o rash   NEURO:  alert, approp, no deficits on motor/cerbellar testing, nl reflexes/ no nystagmus     CXR PA and Lateral:   11/25/2015 :     Right middle lobe collapse. This has been present since CT of 11/05/2014 and CT of the chest is recommended to exclude and endobronchial lesion              Assessment & Plan:

## 2016-01-26 NOTE — Progress Notes (Signed)
Chart and office note reviewed in detail along with available xrays/ labs > agree with a/p as outlined  

## 2016-01-26 NOTE — Patient Instructions (Signed)
Follow med calendar closely and bring to each visit.  Follow up in 3-4 months with Dr. Melvyn Novas  And As needed   Continue on low fat cholesterol diet  Low salt diet .

## 2016-01-26 NOTE — Assessment & Plan Note (Signed)
Controlled on rx   

## 2016-01-26 NOTE — Assessment & Plan Note (Signed)
Recent flare with bronchitis , now resolved  Cont on current regimen  Patient's medications were reviewed today and patient education was given. Computerized medication calendar was adjusted/completed

## 2016-01-26 NOTE — Addendum Note (Signed)
Addended by: Osa Craver on: 01/26/2016 10:09 AM   Modules accepted: Orders

## 2016-01-26 NOTE — Assessment & Plan Note (Signed)
Controlled on rx  Low fat cholesterol diet

## 2016-01-27 NOTE — Addendum Note (Signed)
Addended by: Osa Craver on: 01/27/2016 12:29 PM   Modules accepted: Orders, Medications

## 2016-02-16 ENCOUNTER — Other Ambulatory Visit: Payer: Self-pay | Admitting: Internal Medicine

## 2016-02-18 ENCOUNTER — Other Ambulatory Visit: Payer: Self-pay | Admitting: Cardiovascular Disease

## 2016-02-18 NOTE — Telephone Encounter (Signed)
Rx(s) sent to pharmacy electronically.  

## 2016-02-23 ENCOUNTER — Ambulatory Visit (INDEPENDENT_AMBULATORY_CARE_PROVIDER_SITE_OTHER): Payer: Medicare Other | Admitting: Internal Medicine

## 2016-02-23 ENCOUNTER — Encounter: Payer: Self-pay | Admitting: Internal Medicine

## 2016-02-23 VITALS — BP 120/70 | HR 74 | Ht 59.0 in | Wt 141.4 lb

## 2016-02-23 DIAGNOSIS — I10 Essential (primary) hypertension: Secondary | ICD-10-CM | POA: Diagnosis not present

## 2016-02-23 DIAGNOSIS — J4540 Moderate persistent asthma, uncomplicated: Secondary | ICD-10-CM | POA: Diagnosis not present

## 2016-02-23 DIAGNOSIS — J9811 Atelectasis: Secondary | ICD-10-CM

## 2016-02-23 NOTE — Progress Notes (Signed)
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 Follow in pulmonary clinic also for primary care with hbp/ hyperlipidemia complicated by MI Q000111Q and persistent mucus plug in RML/ RLL s/p fob 02/05/15 with marked improvement p lavage and no endobrchial lesions, just diffuse airway swelling      History of Present Illness   10/14/13 [name] > laser rx for back pain  rad legs > resolved    Admit Date: 10/15/2014 Discharge date: 10/19/2014    PRIMARY DISCHARGE DIAGNOSIS:  NSTEMI (non-ST elevated myocardial infarction)   Essential hypertension  GERD (gastroesophageal reflux disease)  Chest pain  Hyperlipidemia  Hypertension  CAD (coronary artery disease)  11/28/2014 acute ov/Linnet Bottari re: asthma flare despite qvar and symbicort max doses  Chief Complaint  Patient presents with  . Acute Visit    Pt c/o increased wheezing over the past month- esp worse at night.    Wheezing worse at 3 am, sits up and better p saba  > back to sleep,  Onset was indolent and increased as lopressor dose was increased  >>d/c lopressor , rx bisoprolol , pred taper   01/28/2015 Follow up and Med Review    Patient returns for 6 week follow-up and medication review We reviewed all her medications organized them into a medication calendar with patient education . Pt has had 2 hospitalization over last 4 months with NSTEMI in  Oct and HCAP in Nov.  She is starting to feel better , getting back to her baseline.  Last office visit was changed from Lopressor to bisoprolol. Due to wheezing Patient says that she is improved with resolution of wheezing. Chest x-ray last visit showed a persistent right middle and right lower lobe infiltrate. She denies any hemoptysis, unintentional  weight loss, chest pain, orthopnea, PND, leg swelling, or syncope. She is being followed by cardiology for her underlying coronary artery disease. 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 Annual Exam    Pt is fasting. She c/o increased wheezing for the past month. She is using albuterol inhaler approx 4 x per day.   Dr Claiborne Billings following lipids.  Since changed qvar to flovent (insurance restriction) more dep on symbicort 160 2 bid and prn saba for chest tightness/ subj wheeze better always p saba rx  rec Change flovent to qvar 80 Take 2 puffs first thing in am and then another 2 puffs about 12 hours later.  Prednisone 10 mg take  4 each am x 2 days,   2 each am x 2 days,  1 each am x 2 days and stop      12/29/2015 acute extended ov/Quanasia Defino re: cough to point of ribs sore on qvar 80 2bid Chief Complaint  Patient presents with  . Acute Visit    Pt c/o fever, cough, soreness in ribs x 2-3 wks. Cough is prod with yellow sputum. She also c/o wheezing and increased SOB- using rescue inhaler approx 6 x per day.    no fever since  5 days prior to OV  but mucus is thick and yellow  / rib soreness is gen bilaterally  Cough worse at hs / not using med calendar action plan aggressively / no using tramadol / poor hfa  rec Doxycycline 100 mg twice daily x 10 days Prednisone 10 mg take  4 each am x 2 days,   2 each am x 2 days,  1 each am x 2 days and stop  For cough mucinex dm up to 1200 mg every 12 hours and flutter and if needed tramadol up to one every 4 hours    02/23/2016  f/u ov/Mattthew Ziomek re:  Chronic asthma maint rx qvar plus titrate symb presently 160 2bid / abn cxr/ hbp Chief Complaint  Patient presents with  . Follow-up    Pt states that her breathing is back to her normal baseline and she has not had to use albuterol.  Cough has improved some. No new co's today.   Much better now, Not limited by breathing from desired activities  / no noct symptoms or need for any daytime rescue or any meds from action plan   No obvious  pattersn day to day or daytime variability or assoc excess/ purulent sputum or mucus plugs cp or   overt sinus or hb symptoms. No unusual exp hx or h/o childhood pna/ asthma or knowledge of premature birth.  Sleeping ok without nocturnal  or early am exacerbation  of respiratory  c/o's or need for noct saba. Also denies any obvious fluctuation of symptoms with weather or environmental changes or other aggravating or alleviating factors except as outlined above   Current Medications, Allergies, Complete Past Medical History, Past Surgical History, Family History, and Social History were reviewed in [employer] record.  ROS  The following are not active complaints unless bolded sore throat, dysphagia, dental problems, itching, sneezing,  nasal congestion or excess/ purulent secretions, ear ache,   fever, chills, sweats, unintended wt loss, classically pleuritic or exertional cp, hemoptysis,  orthopnea pnd or leg swelling, presyncope, palpitations, abdominal pain, anorexia, nausea, vomiting, diarrhea  or change in bowel or bladder habits, change in stools or urine, dysuria,hematuria,  rash, arthralgias, visual complaints, headache, numbness, weakness or ataxia or problems with walking or coordination,  change in mood/affect or memory.                 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)  OSTEOPENIA (ICD-733.90)  - DEXA 08/22/07 AP spine + 1.1, left femur -1.3, right femur -.8  - DEXA 10/06/09 + 1.6 left femur -1.6, right femur -.5   -DEXA 2014 no change  HYPERTENSION (ICD-401.9)  HYPERLIPIDEMIA (ICD-272.4)  - Target < 130 ldl pos fm hx, hbp  OBESITY  - Target wt = 178 for BMI < 30  ASTHMA (ICD-493.90) ([name] not active)  -Xolair s 8/05 >  10/2006  -Mastered HFA December 30, 2008  ALLERGIC RHINITIS (ICD-477.9)  S/p cervical fusion 2000 ............................................................................ [place]...........................................................................Marland KitchenWert  - Td 05/20/2015  - Pneumovax 09/2003 and October 27, 2009  Prevnar 05/07/2014  - Colonscopy 10/28/2004  - CPX 11/19/13     - GYN per [name]'s office -Mammogram 2013 , 2014  MED CALENDAR 05/05/11 , 05/16/2013 , 08/06/2014 , 01/28/2015 , 08/26/2015        Objective:   Physical Exam  Wt 143 11/09/2011 >  04/12/2012  140 > 06/26/2012  139 >  11/13/2012 135 >  137 02/12/2013 >141 05/16/2013 > 07/31/2013  139 >  11/19/2013  134 >135 02/18/2014 > 04/16/2014 134 > 05/07/2014  134 >131 08/06/2014 > 10/30/2014 130 > 11/28/2014 128 > 02/18/2015   133 > 05/20/2015 133 > 07/14/2015   135 >137 08/26/2015 > 11/25/2015 143 > 12/29/2015  137 > 02/23/2016   131  Pleasant amb bf nad/ vital signs reviewed  HEENT: top dentures, nl turbinates, and orophanx. Nl external ear canals without cough reflex   NECK :  without JVD/Nodes/TM/ nl carotid upstrokes bilaterally   LUNGS: no acc muscle use,    slt distant bs, min end exp wheeze resolves with plm    CV:  RRR  no s3 or murmur or increase in P2, no edema   ABD:  soft and nontender with nl excursion in the supine position. No bruits or organomegaly, bowel sounds nl  MS:  warm without deformities, calf tenderness, cyanosis or clubbing  SKIN: clear w/o rash   NEURO:  alert, approp, no deficits on motor/cerbellar testing, nl reflexes/ no nystagmus      I personally reviewed images and agree with radiology impression as follows:  CXR:  01/11/16 Stable appearance of the right middle lobe collapse. No acute cardiopulmonary process.               Assessment & Plan:

## 2016-02-23 NOTE — Assessment & Plan Note (Signed)
-  Changed lopressor to bisoprolol 11/29/14 due to wheezing > resolved  Adequate control on present rx, reviewed > no change in rx needed

## 2016-02-23 NOTE — Patient Instructions (Signed)
No change in medications - ok to adjust your symbicort as per calendar  See calendar for specific medication instructions and bring it back for each and every office visit for every healthcare provider you see.  Without it,  you may not receive the best quality medical care that we feel you deserve.  You will note that the calendar groups together  your maintenance  medications that are timed at particular times of the day.  Think of this as your checklist for what your doctor has instructed you to do until your next evaluation to see what benefit  there is  to staying on a consistent group of medications intended to keep you well.  The other group at the bottom is entirely up to you to use as you see fit  for specific symptoms that may arise between visits that require you to treat them on an as needed basis.  Think of this as your action plan or "what if" list.   Separating the top medications from the bottom group is fundamental to providing you adequate care going forward.    Please schedule a follow up visit in 3 months but call sooner if needed

## 2016-02-23 NOTE — Assessment & Plan Note (Signed)
-  Xolair rx  07/2004 > 10/2006  - IgE  296 11/20/2013  - Prevnar rx 05/07/14  - 12/29/2015  extensive coaching HFA effectiveness =    75%  All goals of chronic asthma control met including optimal function and elimination of symptoms with minimal need for rescue therapy.  Contingencies discussed in full including contacting this office immediately if not controlling the symptoms using the rule of two's.     I had an extended discussion with the patient reviewing all relevant studies completed to date and  lasting 15 to 20 minutes of a 25 minute visit    Each maintenance medication was reviewed in detail including most importantly the difference between maintenance and prns and under what circumstances the prns are to be triggered using an action plan format that is not reflected in the computer generated alphabetically organized AVS but trather by a customized med calendar that reflects the AVS meds with confirmed 100% correlation.   Please see instructions for details which were reviewed in writing and the patient given a copy highlighting the part that I personally wrote and discussed at today's ov.

## 2016-02-23 NOTE — Assessment & Plan Note (Signed)
XR- 10/2010 Stable volume loss in the right middle lobe vs anterobasal seg RLL, most  apparent on the lateral view. Mild pectus excavatum deformity > no change 11/09/2011  - See CTa 11/06/14 Right middle lobe and lower lobe consolidation / pneumonia with opacified bronchus intermedius and more distal airways. This might be due to aspiration, and difficult to exclude a early necrotizing pneumonia in the middle lobe. No hilar lymphadenopathy or obstructing mass identified - FOB 02/05/15 > copious mucopurulent plugs R > L with neg cytology but mod growth pseudomonas sens to cipro > rec cipro 750 bid x 10 days - 02/18/15  cxr  Repeat much better, nothing on pa view at all now, band like atx RLL ant basal segment - 01/11/16 no change atx RML  rec rx underlying airways dz

## 2016-03-25 ENCOUNTER — Ambulatory Visit (INDEPENDENT_AMBULATORY_CARE_PROVIDER_SITE_OTHER): Payer: Medicare Other | Admitting: Cardiovascular Disease

## 2016-03-25 ENCOUNTER — Encounter: Payer: Self-pay | Admitting: Cardiovascular Disease

## 2016-03-25 VITALS — BP 132/74 | HR 72 | Ht 59.0 in | Wt 143.0 lb

## 2016-03-25 DIAGNOSIS — I2583 Coronary atherosclerosis due to lipid rich plaque: Secondary | ICD-10-CM

## 2016-03-25 DIAGNOSIS — E785 Hyperlipidemia, unspecified: Secondary | ICD-10-CM

## 2016-03-25 DIAGNOSIS — I10 Essential (primary) hypertension: Secondary | ICD-10-CM

## 2016-03-25 DIAGNOSIS — I4510 Unspecified right bundle-branch block: Secondary | ICD-10-CM | POA: Diagnosis not present

## 2016-03-25 DIAGNOSIS — I2510 Atherosclerotic heart disease of native coronary artery without angina pectoris: Secondary | ICD-10-CM | POA: Diagnosis not present

## 2016-03-25 LAB — COMPREHENSIVE METABOLIC PANEL
ALT: 20 U/L (ref 6–29)
AST: 21 U/L (ref 10–35)
Albumin: 4.3 g/dL (ref 3.6–5.1)
Alkaline Phosphatase: 77 U/L (ref 33–130)
BUN: 7 mg/dL (ref 7–25)
CHLORIDE: 102 mmol/L (ref 98–110)
CO2: 28 mmol/L (ref 20–31)
Calcium: 9.7 mg/dL (ref 8.6–10.4)
Creat: 0.76 mg/dL (ref 0.60–0.93)
GLUCOSE: 88 mg/dL (ref 65–99)
POTASSIUM: 4.3 mmol/L (ref 3.5–5.3)
Sodium: 140 mmol/L (ref 135–146)
Total Bilirubin: 0.8 mg/dL (ref 0.2–1.2)
Total Protein: 7.7 g/dL (ref 6.1–8.1)

## 2016-03-25 LAB — LIPID PANEL
CHOL/HDL RATIO: 3.1 ratio (ref <=5.0)
Cholesterol: 198 mg/dL (ref 125–200)
HDL: 63 mg/dL (ref >=46)
LDL Cholesterol: 108 mg/dL (ref <130)
Triglycerides: 133 mg/dL (ref <150)
VLDL: 27 mg/dL (ref <30)

## 2016-03-25 NOTE — Patient Instructions (Signed)
Your physician recommends that you return for lab work.  Your physician wants you to follow-up in: 6 months or sooner if needed. You will receive a reminder letter in the mail two months in advance. If you don't receive a letter, please call our office to schedule the follow-up appointment.  If you need a refill on your cardiac medications before your next appointment, please call your pharmacy.    

## 2016-03-27 ENCOUNTER — Encounter: Payer: Self-pay | Admitting: Cardiovascular Disease

## 2016-03-27 NOTE — Progress Notes (Signed)
Patient ID: Cassie Campbell, female   DOB: [birth year], 75 y.o.   MRN: [id number]     HPI: Cassie Campbell is a 75 y.o. female who presents for 6 month follow-up cardiology evaluation.    Ms. Stanger has a history of complex CAD and underwent catheterization by Dr. Fletcher Anon with class IV symptoms on 10/16/2014.  She was found to have diffusely diseased LAD, which was not suitable for revascularization.  She had ruled in for non-ST segment elevation myocardial infarction, felt to be due to a severely calcified almost subtotal RCA with severe diffuse calcified disease.  Initial attempt at PCI by Dr. Fletcher Anon was unsuccessful due to the severe calcification.  She was brought back to the laboratory the following day, and I performed a very long attempt at high-speed rotational atherectomy.  Unfortunately, due to the severely stenosed calcified lesion above the acute margin the Roto floppy wire was never be advanced distal enough due to severe disease beyond this site to allow the burr to be inserted to reach the stenosis.  Multiple attempts were made to utilize wire transfer, but even the smallest catheter was never able to pass the stenosis to allow for the Roto floppy wire to be reinserted in exchange for a Fielder XT wire, which was able to cross the stenosis and advanced distally.  Consequently, the procedure was aborted.  She was sent home on increased medication regimen with potential plans for possible follow-up evaluation depending upon symptom status.  Since hospital discharge, she has not experienced any significant recurrent anginal chest pain.  During the hospitalization ranolazine was added initially at 500 mg twice a day to isosorbide mononitrate, amlodipine, and beta blocker therapy.  She has been treated with aspirin and Brilinta for dual antiplatelet therapy. When I saw her for subsequent evaluation I further titrated her ranolazine to 1000 mg twice a day and further titrated her Lopressor to 37.5 mg  twice a day.  Her creatinine remained normal following her contrast load and she was mildly anemic.    She has a history of asthma and is on Symbicort 160-4.5 and Singulair.  She  has a history of hyperlipidemia, currently on Lipitor 80 mg.  She does have GERD for which he takes Pepcid as well as omeprazole.  She developed some increased wheezing on Lopressor and was changed to bisoprolol 5 mg in place of metoprolol.  She has seen Dr. Melvyn Novas for her severe chronic asthma and in the past.  She also was found to have marked atopy with significant elevation of IgG E levels.  She feels that her breathing has improved with the changed to bisoprolol from Lopressor.   In March 2016 she had been remaining stable but  had experienced a 10-15 minute episode of chest pain during the evening which responded to nitroglycerin. When I saw her the following day she had been taking isosorbide 90 mg in the morning and I added isosorbide 30 mg at bedtime to her medical regimen of Ranexa 1000 mg twice a day , amlodipine 10 mg daily, and bisoprolol 5 mg. She denies recurrent chest pain symptomatology since the nocturnal dose of Imdur was added.  She developed a rash and  was advised to stop both Brilinta as well as Ranexa.  She was started on Plavix.  Her rash ultimately resolved.    Since I last saw her in November 2016, she has not had any episodes of chest pain.  Her husband passed away in on 2015/12/18.  She  has been more active.  She denies nocturnal symptoms.  She was evaluated in the emergency room in January 2017 with right shoulder discomfort.  She presents  for follow-up evaluation.  Past Medical History  Diagnosis Date  . Benign positional vertigo   . Osteopenia     dexa 08/22/07 AP spine + 1.1, left femur -1.3, right femur -.8; dexa 10/06/09 +1.6, left femur =1.6, right femur -.  . Hypertension   . Hyperlipidemia     <130 ldl pos fm hx, bp  . Obesity   . Asthma     xolair s 8/05 ?11/07; mastered hfa 12/20/08    . Allergic rhinitis   . Ruptured disc, cervical   . Depression   . PONV (postoperative nausea and vomiting)   . CAD (coronary artery disease)     a. 09/2014 NSTEMI s/p LHC with sig 2V dz. dLAD diffusely diseased and not suitable for PCI. unsuccessful RCA PCI d/t heavy calcifications  . Heart attack ([place])     09/2014    Past Surgical History  Procedure Laterality Date  . Cervical disc surgery  12/01  . Vein ligation and stripping    . Vulva /perineum biopsy  12-30-10    --epidermoid cyst  . Breast surgery  10-26-10    Rt. breast bx--for microcalcifications in rt. retroareolar region--dx was hyalinized fibroadenoma  . Bunionectomy Bilateral   . Left heart catheterization with coronary angiogram N/A 10/16/2014    Procedure: LEFT HEART CATHETERIZATION WITH CORONARY ANGIOGRAM;  Surgeon: Blane Ohara, MD;  Location: [facility] CATH LAB;  Service: Cardiovascular;  Laterality: N/A;  . Percutaneous coronary rotoblator intervention (pci-r) N/A 10/17/2014    Procedure: PERCUTANEOUS CORONARY ROTOBLATOR INTERVENTION (PCI-R);  Surgeon: Troy Sine, MD;  Location: [facility] CATH LAB;  Service: Cardiovascular;  Laterality: N/A;  . Video bronchoscopy Bilateral 02/05/2015    Procedure: VIDEO BRONCHOSCOPY WITHOUT FLUORO;  Surgeon: Tanda Rockers, MD;  Location: [facility] ENDOSCOPY;  Service: Endoscopy;  Laterality: Bilateral;    Allergies  Allergen Reactions  . Codeine Phosphate Nausea Only  . Aspirin Swelling and Rash  . Brilinta [Ticagrelor] Rash    Started on Ranexa & Brilinta 09/2014 - rash - unsure which it is directly related to  . Diphenhydramine Hcl Rash  . Fish-Derived Products Swelling and Rash  . Penicillins Swelling and Rash    Current Outpatient Prescriptions  Medication Sig Dispense Refill  . albuterol (PROAIR HFA) 108 (90 BASE) MCG/ACT inhaler Inhale 1-2 puffs into the lungs every 4 (four) hours as needed for wheezing. 1-2 every 4-6 hours as needed for wheezing 18 g 5  . amLODipine (NORVASC) 10  MG tablet Take 10 mg by mouth every morning.    Marland Kitchen atorvastatin (LIPITOR) 80 MG tablet Take one tablet (9m) by mouth daily. 30 tablet 2  . beclomethasone (QVAR) 80 MCG/ACT inhaler Take 2 puffs first thing in am and then another 2 puffs about 12 hours later.    . bisoprolol (ZEBETA) 5 MG tablet Take 1 tablet by mouth every morning.   1  . Calcium Carbonate-Vit D-Min 1200-1000 MG-UNIT CHEW Chew 1 tablet by mouth every evening.     . cetirizine (ZYRTEC) 10 MG tablet Take 10 mg by mouth at bedtime as needed (itching and sneezing).     . clopidogrel (PLAVIX) 75 MG tablet Take 75 mg by mouth every morning.    . clotrimazole-betamethasone (LOTRISONE) cream Apply 1 application topically 2 (two) times daily as needed (itching).     . Coenzyme  Q10 200 MG capsule Take 200 mg by mouth every morning.     Marland Kitchen dextromethorphan-guaiFENesin (MUCINEX DM) 30-600 MG per 12 hr tablet Take 1-2 tablets by mouth every 12 (twelve) hours as needed (cough).     . famotidine (PEPCID) 20 MG tablet Take 20 mg by mouth at bedtime.     . fluticasone (FLONASE) 50 MCG/ACT nasal spray Place 2 sprays into both nostrils 2 (two) times daily.    . isosorbide mononitrate (IMDUR) 60 MG 24 hr tablet Take 1.5 tablets (62m) by mouth in the morning and 0.5 (332m tablet in the evening. 180 tablet 9  . meclizine (ANTIVERT) 25 MG tablet TAKE 2 TABLETS BY MOUTH THREE TIMES DAILY AS NEEDED 24 tablet 0  . montelukast (SINGULAIR) 10 MG tablet Take 10 mg by mouth at bedtime.    . Multiple Vitamin (MULTIVITAMIN) capsule Take 1 capsule by mouth every morning.     . Marland Kitchenmeprazole (PRILOSEC) 20 MG capsule Take 20 mg by mouth daily before breakfast.     . oxymetazoline (AFRIN) 0.05 % nasal spray 2 puffs every 12 hours for 5 days as needed for stuffy nose and sinus problems    . ranolazine (RANEXA) 500 MG 12 hr tablet Take 1 tablet (500 mg total) by mouth 2 (two) times daily. 60 tablet 6  . sodium chloride (OCEAN) 0.65 % nasal spray Place 2 sprays into the  nose every 4 (four) hours as needed (nasal congestion).     . SYMBICORT 160-4.5 MCG/ACT inhaler inhale 2 puffs every 12 hours 10.2 Inhaler 2  . traMADol (ULTRAM) 50 MG tablet Take 1 tablet (50 mg total) by mouth every 4 (four) hours as needed (severe cough or pain). 40 tablet 0   No current facility-administered medications for this visit.    Social History   Social History  . Marital Status: Married    Spouse Name: N/A  . Number of Children: N/A  . Years of Education: N/A   Occupational History  . retired [employer] manager  Social History Main Topics  . Smoking status: Never Smoker   . Smokeless tobacco: Never Used  . Alcohol Use: No  . Drug Use: No  . Sexual Activity: No   Other Topics Concern  . Not on file   Social History Narrative    Family History  Problem Relation Age of Onset  . Diabetes Mother     AODM  . Stroke Mother   . Hypertension Mother   . Heart disease Father   . Breast cancer Daughter 36    dec--mets to liver/spine  . Diabetes Sister     AODM  . Breast cancer Sister   . Hypertension Sister     ROS General: Negative; No fevers, chills, or night sweats HEENT: Negative; No changes in vision or hearing, sinus congestion, difficulty swallowing Pulmonary: positive for asthma; No cough, wheezing, shortness of breath, hemoptysis Cardiovascular: See HPI:  GI: Negative; No nausea, vomiting, diarrhea, or abdominal pain GU: Negative; No dysuria, hematuria, or difficulty voiding Musculoskeletal: Occasional right shoulder discomfort Hematologic: Negative; no easy bruising, bleeding Endocrine: Negative; no heat/cold intolerance; no diabetes, Neuro: Negative; no changes in balance, headaches Skin: Negative; No rashes or skin lesions Psychiatric: Negative; No behavioral problems, depression Sleep: Negative; No snoring,  daytime sleepiness, hypersomnolence, bruxism, restless legs, hypnogognic hallucinations. Other comprehensive 14 point system  review is negative   Physical Exam BP 132/74 mmHg  Pulse 72  Ht 4' 11" (1.499 m)  Wt 143 lb (  64.864 kg)  BMI 28.87 kg/m2  LMP 12/21/1999   Wt Readings from Last 3 Encounters:  03/25/16 143 lb (64.864 kg)  02/23/16 141 lb 6.4 oz (64.139 kg)  01/26/16 140 lb (63.504 kg)   General: Alert, oriented, no distress.  Skin: normal turgor, no rashes, warm and dry HEENT: Normocephalic, atraumatic. Pupils equal round and reactive to light; sclera anicteric; extraocular muscles intact, No lid lag; Nose without nasal septal hypertrophy; Mouth/Parynx benign; Mallinpatti scale 3 Neck: No JVD, no carotid bruits; normal carotid upstroke Lungs: clear to ausculatation and percussion bilaterally; no wheezing or rales, normal inspiratory and expiratory effort Chest wall: without tenderness to palpitation Heart: PMI not displaced, RRR, s1 s2 normal, 1/6 systolic murmur, No diastolic murmur, no rubs, gallops, thrills, or heaves Abdomen: soft, nontender; no hepatosplenomehaly, BS+; abdominal aorta nontender and not dilated by palpation. Back: no CVA tenderness Pulses: 2+; Musculoskeletal: full range of motion, normal strength, no joint deformities Extremities: Pulses 2+, no clubbing cyanosis or edema, Homan's sign negative  Neurologic: grossly nonfocal; Cranial nerves grossly wnl Psychologic: Normal mood and affect  ECG (independently read by me): Normal sinus rhythm at 70 bpm.  Right bundle-branch block with repolarization changes.  November 2016 ECG (independently read by me): Normal sinus rhythm at 70 bpm.  Right bundle-branch block with repolarization changes.  May 2016 ECG (independently read by me):  Normal sinus rhythm at 65 bpm. Right bundle branch block with repolarization changes.  March 2016 ECG (independently read by me): Normal sinus rhythm at 68 bpm.  No ectopy.  No signal been ST segment changes.  December 2015 ECG (independently read by me): Normal sinus rhythm at 77 bpm.  Normal  intervals.  No significant ST changes.  November 2015 ECG (independently read by me):sinus rhythm at 88 bpm.  QTc interval 454 ms.  No significant ST-T changes.  LABS:  BMP Latest Ref Rng 03/25/2016 01/11/2016 10/30/2015  Glucose 65 - 99 mg/dL 88 109(H) 83  BUN 7 - 25 mg/dL 7 <5(L) 10  Creatinine 0.60 - 0.93 mg/dL 0.76 0.66 0.65  Sodium 135 - 146 mmol/L 140 142 136  Potassium 3.5 - 5.3 mmol/L 4.3 3.3(L) 4.2  Chloride 98 - 110 mmol/L 102 105 102  CO2 20 - 31 mmol/L _0 Calcium 8.6 - 10.4 mg/dL 9.7 9.3 9.8    Hepatic Function Latest Ref Rng 03/25/2016 10/30/2015 02/19/2015  Total Protein 6.1 - 8.1 g/dL 7.7 7.5 7.5  Albumin 3.6 - 5.1 g/dL 4.3 4.2 4.2  AST 10 - 35 U/L 21 32 30  ALT 6 - 29 U/L 20 34(H) 28  Alk Phosphatase 33 - 130 U/L 77 86 65  Total Bilirubin 0.2 - 1.2 mg/dL 0.8 0.8 0.8    CBC Latest Ref Rng 01/11/2016 10/30/2015 11/05/2014  WBC 4.0 - 10.5 K/uL 7.6 6.1 9.0  Hemoglobin 12.0 - 15.0 g/dL 12.8 13.5 14.2  Hematocrit 36.0 - 46.0 % 37.6 40.4 41.3  Platelets 150 - 400 K/uL 442(H) 438(H) 551(H)   Lab Results  Component Value Date   MCV 89.1 01/11/2016   MCV 85.8 10/30/2015   MCV 89.0 11/05/2014   Lab Results  Component Value Date   TSH 1.180 10/30/2015  No results found for: HGBA1C  Lipid Panel     Component Value Date/Time   CHOL 198 03/25/2016 1009   TRIG 133 03/25/2016 1009   TRIG 85 10/05/2006 1022   HDL 63 03/25/2016 1009   CHOLHDL 3.1 03/25/2016 1009   CHOLHDL 4.3 CALC  10/05/2006 1022   VLDL 27 03/25/2016 1009   LDLCALC 108 03/25/2016 1009   RADIOLOGY: No results found.    ASSESSMENT AND PLAN: Mrs. Marcedes Tech is a 75 year old African-American female who suffered a NSTEMI  in October 2015 which was felt to be due to subtotal high-grade calcified RCA with severe diffuse distal disease beyond her subtotal stenosis.  She also has a severely diffusely diseased mid distal LAD which is not amenable for revascularization.  At the time of her  intervention, the smallest balloon catheter that was available in the catheterization laboratory was a 1.20 and this was unable to cross the stenosis.  Since that time, we have been titrating her medical regimen.  Is only she remains asymptomatic without any anginal symptoms on amlodipine 10 mg, bisoprolol 5 mg, isosorbide mononitrate 90 mg in the morning and 30 mg at night in addition to her note was seen at just 500 mg twice a day.  Her ECG is unchanged.  She does have asthma and this has been stable on Symbicort in addition to Singulair.  She is on hyperal and see stent with Lipitor 80 mg for hyperlipidemia.  LDL had risen to 103 when last checked 4 months ago.  She is on Plavix 75 mg and is tolerating this well without rash.  I have recommended a complete set of follow-up laboratory be obtained in the fasting state.  When she was seen in the emergency room in January with shoulder discomfort.  Her potassium was low and this will be rechecked.  I will contact her regarding the results of blood work.  She's not having any GERD and is stable on omeprazole.  I will see her in 6 months for cardiology reevaluation.. Time spent: 25 minutes  Troy Sine, MD, [facility]  03/27/2016 9:44 AM

## 2016-03-31 ENCOUNTER — Ambulatory Visit (INDEPENDENT_AMBULATORY_CARE_PROVIDER_SITE_OTHER): Payer: Medicare Other | Admitting: Obstetrics and Gynecology

## 2016-03-31 ENCOUNTER — Encounter: Payer: Self-pay | Admitting: Obstetrics and Gynecology

## 2016-03-31 VITALS — BP 126/78 | HR 70 | Resp 16 | Ht 59.0 in | Wt 144.4 lb

## 2016-03-31 DIAGNOSIS — Z01419 Encounter for gynecological examination (general) (routine) without abnormal findings: Secondary | ICD-10-CM | POA: Diagnosis not present

## 2016-03-31 NOTE — Progress Notes (Signed)
Patient ID: Cassie Campbell, female   DOB: 08/21/1941, 75 y.o.   MRN: [id number] 75 y.o. [id number] Widowed [place] American female here for annual exam.    No concerns today.   No vaginal bleeding or spotting.   Doing OK since the death of her husband.  Has good support system.   PCP:  Christinia Gully, MD  Patient's last menstrual period was 12/21/1999.           Sexually active: No. female The current method of family planning is post menopausal status.    Exercising: Yes.    walks 30 minutes daily and does stretches. Smoker:  no  Health Maintenance: Pap:  10-21-09 Neg History of abnormal Pap:  no MMG:  11-24-15 3D/Density C/benign appearing calcifications noted bilaterally. There's a 0.5cm round focal asymmetry Lt.breast--additional views on 11-27-15 with Diag.Lt.were negative/BiRads2/screening 1year:[facility] Colonoscopy:  10/2004 normal with Dr. Fuller Plan and advised by Cardiologist not to have another one. BMD:   02-15-13  Result  Osteopenia with Dr. Melvyn Novas TDaP:  09/2005.  PCP will do.  Gardasil:   N/A   Screening Labs:  Hb today: PCP or Cardiologist, Urine today: unable to void   reports that she has never smoked. She has never used smokeless tobacco. She reports that she does not drink alcohol or use illicit drugs.  Past Medical History  Diagnosis Date  . Benign positional vertigo   . Osteopenia     dexa 08/22/07 AP spine + 1.1, left femur -1.3, right femur -.8; dexa 10/06/09 +1.6, left femur =1.6, right femur -.  . Hypertension   . Hyperlipidemia     <130 ldl pos fm hx, bp  . Obesity   . Asthma     xolair s 8/05 ?11/07; mastered hfa 12/20/08  . Allergic rhinitis   . Ruptured disc, cervical   . Depression   . PONV (postoperative nausea and vomiting)   . CAD (coronary artery disease)     a. 09/2014 NSTEMI s/p LHC with sig 2V dz. dLAD diffusely diseased and not suitable for PCI. unsuccessful RCA PCI d/t heavy calcifications  . Heart attack ([place])     09/2014    Past Surgical History   Procedure Laterality Date  . Cervical disc surgery  12/01  . Vein ligation and stripping    . Vulva /perineum biopsy  12-30-10    --epidermoid cyst  . Breast surgery  10-26-10    Rt. breast bx--for microcalcifications in rt. retroareolar region--dx was hyalinized fibroadenoma  . Bunionectomy Bilateral   . Left heart catheterization with coronary angiogram N/A 10/16/2014    Procedure: LEFT HEART CATHETERIZATION WITH CORONARY ANGIOGRAM;  Surgeon: Blane Ohara, MD;  Location[facility] CATH LAB;  Service: Cardiovascular;  Laterality: N/A;  . Percutaneous coronary rotoblator intervention (pci-r) N/A 10/17/2014    Procedure: PERCUTANEOUS CORONARY ROTOBLATOR INTERVENTION (PCI-R);  Surgeon: Troy Sine, MD;  Location: [facility] CATH LAB;  Service: Cardiovascular;  Laterality: N/A;  . Video bronchoscopy Bilateral 02/05/2015    Procedure: VIDEO BRONCHOSCOPY WITHOUT FLUORO;  Surgeon: Tanda Rockers, MD;  Location: [facility] ENDOSCOPY;  Service: Endoscopy;  Laterality: Bilateral;    Current Outpatient Prescriptions  Medication Sig Dispense Refill  . albuterol (PROAIR HFA) 108 (90 BASE) MCG/ACT inhaler Inhale 1-2 puffs into the lungs every 4 (four) hours as needed for wheezing. 1-2 every 4-6 hours as needed for wheezing 18 g 5  . amLODipine (NORVASC) 10 MG tablet Take 10 mg by mouth every morning.    Marland Kitchen atorvastatin (LIPITOR)  80 MG tablet Take one tablet (80mg ) by mouth daily. 30 tablet 2  . beclomethasone (QVAR) 80 MCG/ACT inhaler Take 2 puffs first thing in am and then another 2 puffs about 12 hours later.    . bisoprolol (ZEBETA) 5 MG tablet Take 1 tablet by mouth every morning.   1  . Calcium Carbonate-Vit D-Min 1200-1000 MG-UNIT CHEW Chew 1 tablet by mouth every evening.     . cetirizine (ZYRTEC) 10 MG tablet Take 10 mg by mouth at bedtime as needed (itching and sneezing).     . clopidogrel (PLAVIX) 75 MG tablet Take 75 mg by mouth every morning.    . clotrimazole-betamethasone (LOTRISONE) cream Apply 1  application topically 2 (two) times daily as needed (itching).     . Coenzyme Q10 200 MG capsule Take 200 mg by mouth every morning.     Marland Kitchen dextromethorphan-guaiFENesin (MUCINEX DM) 30-600 MG per 12 hr tablet Take 1-2 tablets by mouth every 12 (twelve) hours as needed (cough).     . famotidine (PEPCID) 20 MG tablet Take 20 mg by mouth at bedtime.     . fluticasone (FLONASE) 50 MCG/ACT nasal spray Place 2 sprays into both nostrils 2 (two) times daily.    . isosorbide mononitrate (IMDUR) 60 MG 24 hr tablet Take 1.5 tablets (90mg ) by mouth in the morning and 0.5 (30mg ) tablet in the evening. 180 tablet 9  . meclizine (ANTIVERT) 25 MG tablet TAKE 2 TABLETS BY MOUTH THREE TIMES DAILY AS NEEDED 24 tablet 0  . montelukast (SINGULAIR) 10 MG tablet Take 10 mg by mouth at bedtime.    . Multiple Vitamin (MULTIVITAMIN) capsule Take 1 capsule by mouth every morning.     Marland Kitchen omeprazole (PRILOSEC) 20 MG capsule Take 20 mg by mouth daily before breakfast.     . oxymetazoline (AFRIN) 0.05 % nasal spray 2 puffs every 12 hours for 5 days as needed for stuffy nose and sinus problems    . ranolazine (RANEXA) 500 MG 12 hr tablet Take 1 tablet (500 mg total) by mouth 2 (two) times daily. 60 tablet 6  . sodium chloride (OCEAN) 0.65 % nasal spray Place 2 sprays into the nose every 4 (four) hours as needed (nasal congestion).     . SYMBICORT 160-4.5 MCG/ACT inhaler inhale 2 puffs every 12 hours 10.2 Inhaler 2  . traMADol (ULTRAM) 50 MG tablet Take 1 tablet (50 mg total) by mouth every 4 (four) hours as needed (severe cough or pain). 40 tablet 0   No current facility-administered medications for this visit.    Family History  Problem Relation Age of Onset  . Diabetes Mother     AODM  . Stroke Mother   . Hypertension Mother   . Heart disease Father   . Breast cancer Daughter 36    dec--mets to liver/spine  . Diabetes Sister     AODM  . Breast cancer Sister   . Hypertension Sister     ROS:  Pertinent items are  noted in HPI.  Otherwise, a comprehensive ROS was negative.  Exam:   BP 126/78 mmHg  Pulse 70  Resp 16  Ht 4\' 11"  (1.499 m)  Wt 144 lb 6.4 oz (65.499 kg)  BMI 29.15 kg/m2  LMP 12/21/1999    General appearance: alert, cooperative and appears stated age Head: Normocephalic, without obvious abnormality, atraumatic Neck: no adenopathy, supple, symmetrical, trachea midline and thyroid normal to inspection and palpation Lungs: clear to auscultation bilaterally Breasts: normal appearance, no masses  or tenderness, Inspection negative, No nipple retraction or dimpling, No nipple discharge or bleeding, No axillary or supraclavicular adenopathy Heart: regular rate and rhythm Abdomen: incisions:  No.  , soft, non-tender; no masses, no organomegaly Extremities: extremities normal, atraumatic, no cyanosis or edema Skin: Skin color, texture, turgor normal. No rashes or lesions Lymph nodes: Cervical, supraclavicular, and axillary nodes normal. No abnormal inguinal nodes palpated Neurologic: Grossly normal  Pelvic: External genitalia:  no lesions.  Left vulva with 1 cm               Urethra:  normal appearing urethra with no masses, tenderness or lesions              Bartholins and Skenes: normal                 Vagina: normal appearing vagina with normal color and discharge, no lesions              Cervix: no lesions              Pap taken: Yes.   Bimanual Exam:  Uterus:  normal size, contour, position, consistency, mobility, non-tender              Adnexa: normal adnexa and no mass, fullness, tenderness              Rectal exam: Yes.  .  Confirms.              Anus:  normal sphincter tone, no lesions  Chaperone was present for exam.  Assessment:   Well woman visit with normal exam. FH of breast cancer.  Osteopenia.   Plan: Yearly mammogram recommended after age 12.  Recommended self breast exam.  Pap and HR HPV as above. Discussed Calcium, Vitamin D, regular exercise program including  cardiovascular and weight bearing exercise. Labs performed.  No..    Will do with PCP.  Prescription medication(s) given.  No. Bone density and TDap recommended.  Will do with PCP.  Follow up annually and prn.       After visit summary provided.

## 2016-03-31 NOTE — Patient Instructions (Signed)

## 2016-04-01 LAB — IPS PAP SMEAR ONLY

## 2016-05-12 ENCOUNTER — Telehealth: Payer: Self-pay | Admitting: Internal Medicine

## 2016-05-12 ENCOUNTER — Other Ambulatory Visit: Payer: Self-pay | Admitting: Internal Medicine

## 2016-05-12 DIAGNOSIS — J4540 Moderate persistent asthma, uncomplicated: Secondary | ICD-10-CM

## 2016-05-12 MED ORDER — BECLOMETHASONE DIPROPIONATE 80 MCG/ACT IN AERS: INHALATION_SPRAY | RESPIRATORY_TRACT | Status: DC

## 2016-05-12 NOTE — Telephone Encounter (Signed)
Called spoke with pt. She states she needs a refill on her Qvar sent to [facility] on [address]. I explained to her that I would send Rx today. She voiced understanding and had no further questions. Rx sent. Nothing further needed.

## 2016-05-19 ENCOUNTER — Other Ambulatory Visit: Payer: Self-pay | Admitting: Cardiovascular Disease

## 2016-05-20 NOTE — Telephone Encounter (Signed)
Rx(s) sent to pharmacy electronically.  

## 2016-05-25 ENCOUNTER — Ambulatory Visit: Payer: Medicare Other | Admitting: Internal Medicine

## 2016-06-10 ENCOUNTER — Ambulatory Visit (INDEPENDENT_AMBULATORY_CARE_PROVIDER_SITE_OTHER)
Admission: RE | Admit: 2016-06-10 | Discharge: 2016-06-10 | Disposition: A | Discharge status: Home / Self Care | Payer: Medicare Other | Source: Ambulatory Visit | Attending: Internal Medicine | Admitting: Internal Medicine

## 2016-06-10 ENCOUNTER — Other Ambulatory Visit (INDEPENDENT_AMBULATORY_CARE_PROVIDER_SITE_OTHER): Payer: Medicare Other

## 2016-06-10 ENCOUNTER — Ambulatory Visit (INDEPENDENT_AMBULATORY_CARE_PROVIDER_SITE_OTHER): Payer: Medicare Other | Admitting: Internal Medicine

## 2016-06-10 ENCOUNTER — Encounter: Payer: Self-pay | Admitting: Internal Medicine

## 2016-06-10 VITALS — BP 110/74 | HR 76 | Ht 59.0 in | Wt 148.0 lb

## 2016-06-10 DIAGNOSIS — R0600 Dyspnea, unspecified: Secondary | ICD-10-CM | POA: Diagnosis not present

## 2016-06-10 DIAGNOSIS — I10 Essential (primary) hypertension: Secondary | ICD-10-CM

## 2016-06-10 DIAGNOSIS — J4540 Moderate persistent asthma, uncomplicated: Secondary | ICD-10-CM

## 2016-06-10 DIAGNOSIS — R0609 Other forms of dyspnea: Secondary | ICD-10-CM

## 2016-06-10 LAB — BASIC METABOLIC PANEL
BUN: 9 mg/dL (ref 6–23)
CO2: 33 mEq/L — ABNORMAL HIGH (ref 19–32)
CREATININE: 0.75 mg/dL (ref 0.40–1.20)
Calcium: 10 mg/dL (ref 8.4–10.5)
Chloride: 102 mEq/L (ref 96–112)
GFR: 97.21 mL/min (ref >60.00)
Glucose, Bld: 103 mg/dL — ABNORMAL HIGH (ref 70–99)
POTASSIUM: 4 meq/L (ref 3.5–5.1)
Sodium: 141 mEq/L (ref 135–145)

## 2016-06-10 LAB — NITRIC OXIDE: NITRIC OXIDE: 15

## 2016-06-10 LAB — CBC WITH DIFFERENTIAL/PLATELET
BASOS ABS: 0 10*3/uL (ref 0.0–0.1)
Basophils Relative: 0.3 % (ref 0.0–3.0)
EOS ABS: 0.1 10*3/uL (ref 0.0–0.7)
Eosinophils Relative: 1.4 % (ref 0.0–5.0)
HCT: 40 % (ref 36.0–46.0)
Hemoglobin: 13.5 g/dL (ref 12.0–15.0)
LYMPHS ABS: 1.2 10*3/uL (ref 0.7–4.0)
Lymphocytes Relative: 15.1 % (ref 12.0–46.0)
MCHC: 33.9 g/dL (ref 30.0–36.0)
MCV: 90.1 fl (ref 78.0–100.0)
MONO ABS: 0.6 10*3/uL (ref 0.1–1.0)
MONOS PCT: 7 % (ref 3.0–12.0)
NEUTROS PCT: 76.2 % (ref 43.0–77.0)
Neutro Abs: 6 10*3/uL (ref 1.4–7.7)
PLATELETS: 440 10*3/uL — AB (ref 150.0–400.0)
RBC: 4.44 Mil/uL (ref 3.87–5.11)
RDW: 14.5 % (ref 11.5–15.5)
WBC: 7.9 10*3/uL (ref 4.0–10.5)

## 2016-06-10 LAB — TSH: TSH: 1.49 u[IU]/mL (ref 0.35–4.50)

## 2016-06-10 LAB — BRAIN NATRIURETIC PEPTIDE: PRO B NATRI PEPTIDE: 23 pg/mL (ref 0.0–100.0)

## 2016-06-10 NOTE — Patient Instructions (Addendum)
Please remember to go to the lab and x-ray department downstairs for your tests - we will call you with the results when they are available.  See calendar for specific medication instructions and bring it back for each and every office visit for every healthcare provider you see.  Without it,  you may not receive the best quality medical care that we feel you deserve.  You will note that the calendar groups together  your maintenance  medications that are timed at particular times of the day.  Think of this as your checklist for what your doctor has instructed you to do until your next evaluation to see what benefit  there is  to staying on a consistent group of medications intended to keep you well.  The other group at the bottom is entirely up to you to use as you see fit  for specific symptoms that may arise between visits that require you to treat them on an as needed basis.  Think of this as your action plan or "what if" list.   Separating the top medications from the bottom group is fundamental to providing you adequate care going forward.       Please schedule a follow up office visit in 2 weeks, sooner if needed

## 2016-06-10 NOTE — Progress Notes (Signed)
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 Follow in pulmonary clinic also for primary care with hbp/ hyperlipidemia complicated by MI Q000111Q and persistent mucus plug in RML/ RLL s/p fob 02/05/15 with marked improvement p lavage and no endobrchial lesions, just diffuse airway swelling      History of Present Illness   10/14/13 [name] > laser rx for back pain  rad legs > resolved    Admit Date: 10/15/2014 Discharge date: 10/19/2014    PRIMARY DISCHARGE DIAGNOSIS:  NSTEMI (non-ST elevated myocardial infarction)   Essential hypertension  GERD (gastroesophageal reflux disease)  Chest pain  Hyperlipidemia  Hypertension  CAD (coronary artery disease)  11/28/2014 acute ov/Cassie Campbell re: asthma flare despite qvar and symbicort max doses  Chief Complaint  Patient presents with  . Acute Visit    Pt c/o increased wheezing over the past month- esp worse at night.    Wheezing worse at 3 am, sits up and better p saba  > back to sleep,  Onset was indolent and increased as lopressor dose was increased  >>d/c lopressor , rx bisoprolol , pred taper   01/28/2015 Follow up and Med Review    Patient returns for 6 week follow-up and medication review We reviewed all her medications organized them into a medication calendar with patient education . Pt has had 2 hospitalization over last 4 months with NSTEMI in  Oct and HCAP in Nov.  She is starting to feel better , getting back to her baseline.  Last office visit was changed from Lopressor to bisoprolol. Due to wheezing Patient says that she is improved with resolution of wheezing. Chest x-ray last visit showed a persistent right middle and right lower lobe infiltrate. She denies any hemoptysis, unintentional  weight loss, chest pain, orthopnea, PND, leg swelling, or syncope. She is being followed by cardiology for her underlying coronary artery disease. 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 Annual Exam    Pt is fasting. She c/o increased wheezing for the past month. She is using albuterol inhaler approx 4 x per day.   Dr Claiborne Billings following lipids.  Since changed qvar to flovent (insurance restriction) more dep on symbicort 160 2 bid and prn saba for chest tightness/ subj wheeze better always p saba rx  rec Change flovent to qvar 80 Take 2 puffs first thing in am and then another 2 puffs about 12 hours later.  Prednisone 10 mg take  4 each am x 2 days,   2 each am x 2 days,  1 each am x 2 days and stop      12/29/2015 acute extended ov/Cassie Campbell re: cough to point of ribs sore on qvar 80 2bid Chief Complaint  Patient presents with  . Acute Visit    Pt c/o fever, cough, soreness in ribs x 2-3 wks. Cough is prod with yellow sputum. She also c/o wheezing and increased SOB- using rescue inhaler approx 6 x per day.    no fever since  5 days prior to OV  but mucus is thick and yellow  / rib soreness is gen bilaterally  Cough worse at hs / not using med calendar action plan aggressively / no using tramadol / poor hfa  rec Doxycycline 100 mg twice daily x 10 days Prednisone 10 mg take  4 each am x 2 days,   2 each am x 2 days,  1 each am x 2 days and stop  For cough mucinex dm up to 1200 mg every 12 hours and flutter and if needed tramadol up to one every 4 hours    02/23/2016  f/u ov/Cassie Campbell re:  Chronic asthma maint rx qvar plus titrate symb presently 160 2bid / abn cxr/ hbp Chief Complaint  Patient presents with  . Follow-up    Pt states that her breathing is back to her normal baseline and she has not had to use albuterol.  Cough has improved some. No new co's today.   Much better now, Not limited by breathing from desired activities  / no noct symptoms or need for any daytime rescue or any meds from action plan rec No change in medications - ok to adjust your symbicort as per calendar See calendar for specific medication instructions    06/10/2016  Extended f/u ov/Cassie Campbell re: asthma/ progressive sob  ? Etiology  Chief Complaint  Patient presents with  . Follow-up    She has noticed swelling in her ankles off and off over the past 2 months. Concerned about wt gain- 7 lbs higher today compared to last visit.   indolent onset progressive sob assoc with leg swelling while on amlodipine 10 mg daily Some better p saba but rarely uses  Doe = MMRC2 = can't walk a nl pace on a flat grade s sob but does fine slow and flat eg grocery shopping in [place], worse in heat outdoors     No obvious other  Patterns in  day to day sob  or obvious daytime variability or assoc excess/ purulent sputum or mucus plugs cp or   overt sinus or hb symptoms. No unusual exp hx or h/o childhood pna/ asthma or knowledge of premature birth.  Sleeping ok without nocturnal  or early am exacerbation  of respiratory  c/o's or need for noct saba. Also denies any obvious fluctuation of symptoms with weather or environmental changes or other aggravating or alleviating factors except as outlined above   Current Medications, Allergies, Complete Past Medical History, Past Surgical History, Family History, and Social History were reviewed in [employer] record.  ROS  The following are not active complaints unless bolded sore throat, dysphagia, dental problems, itching, sneezing,  nasal congestion or excess/ purulent secretions, ear ache,   fever, chills, sweats, unintended wt loss, classically pleuritic or exertional cp, hemoptysis,  orthopnea pnd or leg swelling, presyncope, palpitations, abdominal pain, anorexia, nausea, vomiting,  diarrhea  or change in bowel or bladder habits, change in stools or urine, dysuria,hematuria,  rash, arthralgias, visual complaints, headache, numbness, weakness or ataxia or problems with walking or coordination,  change in mood/affect or memory.              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 [place]...........................................................................Marland KitchenWert  - Td 05/20/2015  - Pneumovax 09/2003 and October 27, 2009  Prevnar 05/07/2014  - Colonscopy 10/28/2004  - CPX 11/19/13     - GYN per [name]'s office -Mammogram 2013 , 2014  MED CALENDAR 05/05/11 , 05/16/2013 , 08/06/2014 , 01/28/2015 , 08/26/2015        Objective:   Physical Exam  Wt 143 11/09/2011 >  04/12/2012  140 > 06/26/2012  139 >  11/13/2012 135 >  137 02/12/2013 >141 05/16/2013 > 07/31/2013  139 >  11/19/2013  134 >135 02/18/2014 > 04/16/2014 134 > 05/07/2014  134 >131 08/06/2014 > 10/30/2014 130 > 11/28/2014 128 > 02/18/2015   133 > 05/20/2015 133 > 07/14/2015   135 >137 08/26/2015 > 11/25/2015 143 > 12/29/2015  137 > 02/23/2016   131 > 06/10/2016   148   Pleasant amb bf nad/ vital signs reviewed  HEENT: top dentures, nl turbinates, and orophanx. Nl external ear canals without cough reflex   NECK :  without JVD/Nodes/TM/ nl carotid upstrokes bilaterally   LUNGS: no acc muscle use,  slt distant bs, min end exp wheeze resolves with plm    CV:  RRR  no  s3 or murmur or increase in P2, no significant pitting edema   ABD:  soft and nontender with nl excursion in the supine position. No bruits or organomegaly, bowel sounds nl  MS:  warm without deformities, calf tenderness, cyanosis or clubbing  SKIN: clear w/o rash   NEURO:  alert, approp, no deficits on motor/cerbellar testing, nl reflexes/ no nystagmus    CXR PA and Lateral:   06/10/2016 :    I personally reviewed images and agree with radiology impression as follows:    Bibasilar subsegmental atelectasis and/or scarring. Similar findings noted on multiple prior exams.    Labs ordered/ reviewed:      Chemistry      Component Value Date/Time   NA 141 06/10/2016 1300   K 4.0 06/10/2016 1300   CL 102 06/10/2016 1300   CO2 33* 06/10/2016 1300   BUN 9 06/10/2016 1300   CREATININE 0.75 06/10/2016 1300   CREATININE 0.76 03/25/2016 1009      Component Value Date/Time   CALCIUM 10.0 06/10/2016 1300   ALKPHOS 77 03/25/2016 1009   AST 21 03/25/2016 1009   ALT 20 03/25/2016 1009   BILITOT 0.8 03/25/2016 1009        Lab Results  Component Value Date   WBC 7.9 06/10/2016   HGB 13.5 06/10/2016   HCT 40.0 06/10/2016   MCV 90.1 06/10/2016   PLT 440.0* 06/10/2016     Lab Results  Component Value Date   DDIMER 1.73* 06/10/2016      Lab Results  Component Value Date   TSH 1.49 06/10/2016     Lab Results  Component Value Date   PROBNP 23.0 06/10/2016                      Assessment & Plan:

## 2016-06-11 ENCOUNTER — Ambulatory Visit (INDEPENDENT_AMBULATORY_CARE_PROVIDER_SITE_OTHER)
Admission: RE | Admit: 2016-06-11 | Discharge: 2016-06-11 | Disposition: A | Discharge status: Home / Self Care | Payer: Medicare Other | Source: Ambulatory Visit | Attending: Internal Medicine | Admitting: Internal Medicine

## 2016-06-11 ENCOUNTER — Other Ambulatory Visit: Payer: Self-pay | Admitting: Internal Medicine

## 2016-06-11 DIAGNOSIS — R0600 Dyspnea, unspecified: Secondary | ICD-10-CM | POA: Diagnosis not present

## 2016-06-11 LAB — RESPIRATORY ALLERGY PROFILE REGION II ~~LOC~~
Allergen, D pternoyssinus,d7: 0.41 kU/L — ABNORMAL HIGH
Allergen, Mulberry, t76: <0.1 kU/L
Allergen, Oak,t7: <0.1 kU/L
Alternaria Alternata: <0.1 kU/L
Aspergillus fumigatus, m3: <0.1 kU/L
Box Elder IgE: <0.1 kU/L
Cat Dander: <0.1 kU/L
Cladosporium Herbarum: <0.1 kU/L
Cockroach: <0.1 kU/L
D. farinae: 0.43 kU/L — ABNORMAL HIGH
ELM IGE: 0.2 kU/L — AB
IGE (IMMUNOGLOBULIN E), SERUM: 293 kU/L — AB (ref <115)
Johnson Grass: <0.1 kU/L
Pecan/Hickory Tree IgE: 0.16 kU/L — ABNORMAL HIGH
Penicillium Notatum: 0.13 kU/L — ABNORMAL HIGH
Rough Pigweed  IgE: <0.1 kU/L
Timothy Grass: <0.1 kU/L

## 2016-06-11 LAB — D-DIMER, QUANTITATIVE (NOT AT ARMC): D DIMER QUANT: 1.73 ug{FEU}/mL — AB (ref <0.50)

## 2016-06-11 MED ORDER — MONTELUKAST SODIUM 10 MG PO TABS: 10.0000 mg | ORAL_TABLET | Freq: Every day | ORAL | Status: DC

## 2016-06-11 MED ORDER — IOPAMIDOL (ISOVUE-370) INJECTION 76%
100.0000 mL | Freq: Once | INTRAVENOUS | Status: AC | PRN
  Administered 2016-06-11: 80 mL via INTRAVENOUS

## 2016-06-11 NOTE — Progress Notes (Signed)
Quick Note:  Spoke with pt and notified of results per Dr. [name]. Pt verbalized understanding and denied any questions.  ______ 

## 2016-06-11 NOTE — Assessment & Plan Note (Addendum)
Symptoms are markedly disproportionate to objective findings and not clear this is a lung problem but pt does appear to have difficult airway management issues.   DDX of  difficult airways management almost all start with A and  include Adherence, Ace Inhibitors, Acid Reflux, Active Sinus Disease, Alpha 1 Antitripsin deficiency, Anxiety masquerading as Airways dz,  ABPA,  Allergy(esp in young), Aspiration (esp in elderly), Adverse effects of meds,  Active smokers, A bunch of PE's (a small clot burden can't cause this syndrome unless there is already severe underlying pulm or vascular dz with poor reserve) plus two Bs  = Bronchiectasis and Beta blocker use..and one C= CHF   Adherence is always the initial "prime suspect" and is a multilayered concern that requires a "trust but verify" approach in every patient - starting with knowing how to use medications, especially inhalers, correctly, keeping up with refills and understanding the fundamental difference between maintenance and prns vs those medications only taken for a very short course and then stopped and not refilled.  - appears to be using med calendar approp  ? Allergy/ asthma >  Repeat IgE now but FENO = 15 does not support unaddressed asthma maint on two ics/singulair > no change rx  ? A bunch of PE's > D dimer is up but relatively low > since we don't have an obvious source will need CTa to complete the w/u but will prob be neg for pe  ? Anxiety related to wt gain> dx of exclusion  ? CHF > excluded by bnp   I had an extended discussion with the patient reviewing all relevant studies completed to date and  lasting 25 minutes of a 40  minute extended office visit    Each maintenance medication was reviewed in detail including most importantly the difference between maintenance and prns and under what circumstances the prns are to be triggered using an action plan format that is not reflected in the computer generated alphabetically organized  AVS.    Please see instructions for details which were reviewed in writing and the patient given a copy highlighting the part that I personally wrote and discussed at today's ov.

## 2016-06-11 NOTE — Assessment & Plan Note (Signed)
(  [name] not active)  -Xolair rx  07/2004 > 10/2006  - IgE  296 11/20/2013  - Prevnar rx 05/07/14  - Spirometry 06/10/2016  FEV1 1.06 (80%)  Ratio 57 - NO   06/10/2016  = 15   - 06/10/2016  After extensive coaching HFA effectiveness =    75%   rec repeat IgE but strongly doubt active asthma component here

## 2016-06-11 NOTE — Assessment & Plan Note (Signed)
-  Changed lopressor to bisoprolol 11/29/14 due to wheezing   Reports leg swelling on norvasc but not evident on exam. Probably  needs arb/amlodipine combo so we can use a lower dose of amlodipine and avoid dep edema.

## 2016-06-14 NOTE — Progress Notes (Signed)
Quick Note:  Spoke with pt and notified of results per Dr. [name]. Pt verbalized understanding and denied any questions.  ______ 

## 2016-06-16 ENCOUNTER — Other Ambulatory Visit: Payer: Self-pay | Admitting: Cardiovascular Disease

## 2016-07-06 ENCOUNTER — Other Ambulatory Visit: Payer: Self-pay | Admitting: Internal Medicine

## 2016-07-07 ENCOUNTER — Telehealth: Payer: Self-pay | Admitting: *Deleted

## 2016-07-07 DIAGNOSIS — J4540 Moderate persistent asthma, uncomplicated: Secondary | ICD-10-CM

## 2016-07-07 MED ORDER — FLUTICASONE FUROATE 100 MCG/ACT IN AEPB: 1.0000 | INHALATION_SPRAY | Freq: Every day | RESPIRATORY_TRACT | Status: DC

## 2016-07-07 NOTE — Telephone Encounter (Signed)
Called [phone] for PA on QVAR. Pt ID [id number] Was advised the preferred medication is arnuity ellipta.  Please advise MW thanks

## 2016-07-07 NOTE — Telephone Encounter (Signed)
Patient notified of change in medication. Rx sent to pharmacy. Patient has appointment with Dr. Melvyn Novas tomorrow, will discuss with him at appointment. Nothing further needed.

## 2016-07-07 NOTE — Telephone Encounter (Signed)
arnuity 100 = one puff each am but return before refilling for the first time to be sure it's a good fit for her

## 2016-07-08 ENCOUNTER — Ambulatory Visit (INDEPENDENT_AMBULATORY_CARE_PROVIDER_SITE_OTHER): Payer: Medicare Other | Admitting: Internal Medicine

## 2016-07-08 ENCOUNTER — Encounter: Payer: Self-pay | Admitting: Internal Medicine

## 2016-07-08 VITALS — BP 144/78 | HR 75 | Ht 59.0 in | Wt 147.8 lb

## 2016-07-08 DIAGNOSIS — J4540 Moderate persistent asthma, uncomplicated: Secondary | ICD-10-CM

## 2016-07-08 DIAGNOSIS — I10 Essential (primary) hypertension: Secondary | ICD-10-CM

## 2016-07-08 LAB — NITRIC OXIDE: NITRIC OXIDE: 9

## 2016-07-08 MED ORDER — BECLOMETHASONE DIPROPIONATE 80 MCG/ACT IN AERS: 2.0000 | INHALATION_SPRAY | Freq: Two times a day (BID) | RESPIRATORY_TRACT | Status: DC

## 2016-07-08 MED ORDER — PREDNISONE 10 MG PO TABS: ORAL_TABLET | ORAL | Status: DC

## 2016-07-08 MED ORDER — FLUTICASONE FUROATE 100 MCG/ACT IN AEPB: 1.0000 | INHALATION_SPRAY | Freq: Every day | RESPIRATORY_TRACT | Status: DC

## 2016-07-08 MED ORDER — BISOPROLOL-HYDROCHLOROTHIAZIDE 5-6.25 MG PO TABS: 1.0000 | ORAL_TABLET | Freq: Every day | ORAL | Status: DC

## 2016-07-08 NOTE — Assessment & Plan Note (Signed)
-  Changed lopressor to bisoprolol 11/29/14 due to wheezing    rec change  to bisoprolol /hct due to tendency to leg swelling

## 2016-07-08 NOTE — Patient Instructions (Signed)
Prednisone 10 mg take  4 each am x 2 days,   2 each am x 2 days,  1 each am x 2 days and stop   Try arnuity 100 one each am in place of qvar but if not better need to check your insurance formulary for alternatives  Please schedule a follow up visit in 3 months but call sooner if needed

## 2016-07-08 NOTE — Assessment & Plan Note (Addendum)
-  Xolair rx  07/2004 > 10/2006  - IgE  296 11/20/2013  - Prevnar rx 05/07/14  - Spirometry 06/10/2016  FEV1 1.06 (80%)  Ratio 57 - NO   06/10/2016  = 15   - 06/10/2016  After extensive coaching HFA effectiveness =    75%  - IgE  06/10/16  293   - FENO 07/08/2016  =   9 suggests this is probably not allergic mediated symptom flare despite below concerns > advised to return if not back to baseline back on qvar sample before changes over to arnuity      - 07/08/2016 instructed on dpi > changed to arnuity 100 qam due to insurance and in meantime main flare off qvar > Prednisone 10 mg take  4 each am x 2 days,   2 each am x 2 days,  1 each am x 2 days and stop   I had an extended discussion with the patient reviewing all relevant studies completed to date and  lasting 15 to 20 minutes of a 25 minute visit    Formulary restrictions will be an ongoing challenge for the forseable future and I would be happy to pick an alternative if the pt will first  provide me a list of them but pt  will need to return here for training for any new device that is required eg dpi vs hfa vs respimat.    In meantime we can always provide samples so the patient never runs out of any needed respiratory medications.   Warned re upper airway symptoms which may flare on dpi arnuity  Each maintenance medication was reviewed in detail including most importantly the difference between maintenance and prns and under what circumstances the prns are to be triggered using an action plan format that is not reflected in the computer generated alphabetically organized AVS.    Please see instructions for details which were reviewed in writing and the patient given a copy highlighting the part that I personally wrote and discussed at today's ov.

## 2016-07-08 NOTE — Progress Notes (Signed)
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 Follow in pulmonary clinic also for primary care with hbp/ hyperlipidemia complicated by MI Q000111Q and persistent mucus plug in RML/ RLL s/p fob 02/05/15 with marked improvement p lavage and no endobrchial lesions, just diffuse airway swelling      History of Present Illness   10/14/13 [name] > laser rx for back pain  rad legs > resolved    Admit Date: 10/15/2014 Discharge date: 10/19/2014    PRIMARY DISCHARGE DIAGNOSIS:  NSTEMI (non-ST elevated myocardial infarction)   Essential hypertension  GERD (gastroesophageal reflux disease)  Chest pain  Hyperlipidemia  Hypertension  CAD (coronary artery disease)  11/28/2014 acute ov/[name] re: asthma flare despite qvar and symbicort max doses  Chief Complaint  Patient presents with  . Acute Visit    Pt c/o increased wheezing over the past month- esp worse at night.    Wheezing worse at 3 am, sits up and better p saba  > back to sleep,  Onset was indolent and increased as lopressor dose was increased  >>d/c lopressor , rx bisoprolol , pred taper      07/08/2016  f/u ov/[name] re: asthma/ hbp Chief Complaint  Patient presents with  . Follow-up    Breathing is unchanged.  No new co's today.    some worse in heat p ran out of qvar 5 d prior to OV  But no noct symptoms or need for noct saba  No obvious other  Patterns in  day to day sob  or obvious daytime variability or assoc excess/ purulent sputum or mucus plugs cp or   overt sinus or hb symptoms. No unusual exp hx or h/o childhood pna/ asthma or knowledge of premature birth.  Sleeping ok without nocturnal  or early am exacerbation  of respiratory  c/o's or need for noct saba. Also denies any obvious  fluctuation of symptoms with weather or environmental changes or other aggravating or alleviating factors except as outlined above   Current Medications, Allergies, Complete Past Medical History, Past Surgical History, Family History, and Social History were reviewed in [employer] record.  ROS  The following are not active complaints unless bolded sore throat, dysphagia, dental problems, itching, sneezing,  nasal congestion or excess/ purulent secretions, ear ache,   fever, chills, sweats, unintended wt loss, classically pleuritic or exertional cp, hemoptysis,  orthopnea pnd or leg swelling, presyncope, palpitations, abdominal pain, anorexia, nausea, vomiting, diarrhea  or change in bowel or bladder habits, change in stools or urine, dysuria,hematuria,  rash, arthralgias, visual complaints, headache, numbness, weakness or ataxia or problems with walking or coordination,  change in mood/affect or memory.              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)  OSTEOPENIA (ICD-733.90)  - DEXA 08/22/07 AP spine + 1.1, left femur -1.3, right femur -.8  - DEXA 10/06/09 + 1.6 left femur -1.6, right femur -.5   -DEXA 2014 no change  HYPERTENSION (ICD-401.9)  HYPERLIPIDEMIA (ICD-272.4)  - Target < 130 ldl pos fm hx, hbp  OBESITY  - Target wt = 178 for BMI < 30  ASTHMA (ICD-493.90) ([name] not active)  -Xolair s 8/05 > 10/2006  -Mastered  [facility] December 30, 2008  ALLERGIC RHINITIS (ICD-477.9)  S/p cervical fusion 2000 ............................................................................ [place]...........................................................................Marland KitchenWert  - Td 05/20/2015  - Pneumovax 09/2003 and October 27, 2009  Prevnar 05/07/2014  - Colonscopy 10/28/2004  - CPX 11/19/13     - GYN per [name]'s office -Mammogram 2013 , 2014  MED CALENDAR 05/05/11 , 05/16/2013 , 08/06/2014 , 01/28/2015 , 08/26/2015        Objective:    Physical Exam  Wt 143 11/09/2011 >  04/12/2012  140 > 06/26/2012  139 >  11/13/2012 135 >  137 02/12/2013 >141 05/16/2013 > 07/31/2013  139 >  11/19/2013  134 >135 02/18/2014 > 04/16/2014 134 > 05/07/2014  134 >131 08/06/2014 > 10/30/2014 130 > 11/28/2014 128 > 02/18/2015   133 > 05/20/2015 133 > 07/14/2015   135 >137 08/26/2015 > 11/25/2015 143 > 12/29/2015  137 > 02/23/2016   131 > 06/10/2016   148 > 07/08/2016   148   Pleasant amb bf nad/ vital signs reviewed  HEENT: top dentures, nl turbinates, and orophanx. Nl external ear canals without cough reflex   NECK :  without JVD/Nodes/TM/ nl carotid upstrokes bilaterally   LUNGS: no acc muscle use,  slt distant bs, min end exp wheeze     CV:  RRR  no s3 or murmur or increase in P2, no significant pitting edema   ABD:  soft and nontender with nl excursion in the supine position. No bruits or organomegaly, bowel sounds nl  MS:  warm without deformities, calf tenderness, cyanosis or clubbing  SKIN: clear w/o rash   NEURO:  alert, approp, no deficits on motor/cerbellar testing, nl reflexes/ no nystagmus    CXR PA and Lateral:   06/10/2016 :    I personally reviewed images and agree with radiology impression as follows:    Bibasilar subsegmental atelectasis and/or scarring. Similar findings noted on multiple prior exams.    Labs  reviewed:      Chemistry      Component Value Date/Time   NA 141 06/10/2016 1300   K 4.0 06/10/2016 1300   CL 102 06/10/2016 1300   CO2 33* 06/10/2016 1300   BUN 9 06/10/2016 1300   CREATININE 0.75 06/10/2016 1300   CREATININE 0.76 03/25/2016 1009      Component Value Date/Time   CALCIUM 10.0 06/10/2016 1300   ALKPHOS 77 03/25/2016 1009   AST 21 03/25/2016 1009   ALT 20 03/25/2016 1009   BILITOT 0.8 03/25/2016 1009        Lab Results  Component Value Date   WBC 7.9 06/10/2016   HGB 13.5 06/10/2016   HCT 40.0 06/10/2016   MCV 90.1 06/10/2016   PLT 440.0* 06/10/2016     Lab Results  Component Value Date    DDIMER 1.73* 06/10/2016      Lab Results  Component Value Date   TSH 1.49 06/10/2016     Lab Results  Component Value Date   PROBNP 23.0 06/10/2016                      Assessment & Plan:

## 2016-07-18 ENCOUNTER — Other Ambulatory Visit: Payer: Self-pay | Admitting: Internal Medicine

## 2016-08-13 ENCOUNTER — Other Ambulatory Visit: Payer: Self-pay | Admitting: Internal Medicine

## 2016-08-20 ENCOUNTER — Other Ambulatory Visit: Payer: Self-pay | Admitting: Surgery

## 2016-08-20 DIAGNOSIS — M545 Low back pain: Secondary | ICD-10-CM

## 2016-09-03 ENCOUNTER — Ambulatory Visit
Admission: RE | Admit: 2016-09-03 | Discharge: 2016-09-03 | Disposition: A | Discharge status: Home / Self Care | Payer: Medicare Other | Source: Ambulatory Visit | Attending: Surgery | Admitting: Surgery

## 2016-09-03 DIAGNOSIS — M545 Low back pain: Secondary | ICD-10-CM

## 2016-09-13 ENCOUNTER — Other Ambulatory Visit: Payer: Self-pay | Admitting: Cardiovascular Disease

## 2016-10-04 ENCOUNTER — Encounter: Payer: Self-pay | Admitting: Cardiovascular Disease

## 2016-10-04 ENCOUNTER — Ambulatory Visit (INDEPENDENT_AMBULATORY_CARE_PROVIDER_SITE_OTHER): Payer: Medicare Other | Admitting: Cardiovascular Disease

## 2016-10-04 VITALS — BP 131/80 | HR 68 | Ht 59.0 in | Wt 146.0 lb

## 2016-10-04 DIAGNOSIS — I2510 Atherosclerotic heart disease of native coronary artery without angina pectoris: Secondary | ICD-10-CM | POA: Diagnosis not present

## 2016-10-04 DIAGNOSIS — I2583 Coronary atherosclerosis due to lipid rich plaque: Secondary | ICD-10-CM

## 2016-10-04 DIAGNOSIS — E785 Hyperlipidemia, unspecified: Secondary | ICD-10-CM

## 2016-10-04 DIAGNOSIS — I4510 Unspecified right bundle-branch block: Secondary | ICD-10-CM

## 2016-10-04 DIAGNOSIS — I10 Essential (primary) hypertension: Secondary | ICD-10-CM

## 2016-10-04 LAB — CBC
HEMATOCRIT: 39.9 % (ref 35.0–45.0)
Hemoglobin: 13.7 g/dL (ref 11.7–15.5)
MCH: 30.6 pg (ref 27.0–33.0)
MCHC: 34.3 g/dL (ref 32.0–36.0)
MCV: 89.1 fL (ref 80.0–100.0)
MPV: 8.5 fL (ref 7.5–12.5)
Platelets: 329 10*3/uL (ref 140–400)
RBC: 4.48 MIL/uL (ref 3.80–5.10)
RDW: 14.5 % (ref 11.0–15.0)
WBC: 5.9 10*3/uL (ref 3.8–10.8)

## 2016-10-04 NOTE — Progress Notes (Signed)
Patient ID: Cassie Campbell, female   DOB: 1941/08/12, 75 y.o.   MRN: [id number]     HPI: Cassie Campbell is a 75 y.o. female who presents for 6 month follow-up cardiology evaluation.    Cassie Campbell has a history of complex CAD and underwent catheterization by Dr. Fletcher Anon with class IV symptoms on 10/16/2014.  She was found to have diffusely diseased LAD, which was not suitable for revascularization.  She had ruled in for non-ST segment elevation myocardial infarction, felt to be due to a severely calcified almost subtotal RCA with severe diffuse calcified disease.  Initial attempt at PCI by Dr. Fletcher Anon was unsuccessful due to the severe calcification.  She was brought back to the laboratory the following day, and I performed a very long attempt at high-speed rotational atherectomy.  Unfortunately, due to the severely stenosed calcified lesion above the acute margin the Roto floppy wire was never be advanced distal enough due to severe disease beyond this site to allow the burr to be inserted to reach the stenosis.  Multiple attempts were made to utilize wire transfer, but even the smallest catheter was never able to pass the stenosis to allow for the Roto floppy wire to be reinserted in exchange for a Fielder XT wire, which was able to cross the stenosis and advanced distally.  Consequently, the procedure was aborted.  She was sent home on increased medication regimen with potential plans for possible follow-up evaluation depending upon symptom status.  Since hospital discharge, she has not experienced any significant recurrent anginal chest pain.  During the hospitalization ranolazine was added initially at 500 mg twice a day to isosorbide mononitrate, amlodipine, and beta blocker therapy.  She has been treated with aspirin and Brilinta for dual antiplatelet therapy. When I saw her for subsequent evaluation I further titrated her ranolazine to 1000 mg twice a day and further titrated her Lopressor to 37.5 mg  twice a day.  Her creatinine remained normal following her contrast load and she was mildly anemic.    She has a history of asthma and is on Symbicort 160-4.5 and Singulair.  She  has a history of hyperlipidemia, currently on Lipitor 80 mg.  She does have GERD for which he takes Pepcid as well as omeprazole.  She developed some increased wheezing on Lopressor and was changed to bisoprolol 5 mg in place of metoprolol.  She has seen Dr. Melvyn Novas for her severe chronic asthma and in the past.  She also was found to have marked atopy with significant elevation of IgG E levels.  She feels that her breathing has improved with the changed to bisoprolol from Lopressor.   In March 2016 she had been remaining stable but  had experienced a 10-15 minute episode of chest pain during the evening which responded to nitroglycerin. When I saw her the following day she had been taking isosorbide 90 mg in the morning and I added isosorbide 30 mg at bedtime to her medical regimen of Ranexa 1000 mg twice a day , amlodipine 10 mg daily, and bisoprolol 5 mg. She denies recurrent chest pain symptomatology since the nocturnal dose of Imdur was added.  She developed a rash and  was advised to stop both Brilinta and Ranexa.  She was started on Plavix. Her rash ultimately resolved.     Her husband passed away in on January 01, 2016.  She has been more active.  She denies nocturnal symptoms.  She was evaluated in the emergency room in January 2017  with right shoulder discomfort.  Since I last saw her, she denies any episodes of chest pain or shortness of breath.  She joined the [employer] and is exercising 2 days per week.  She continues to be on amlodipine 10 mg, isosorbide 90 mg in the morning and 30 mg in the evening, and when I last saw her, I reinstituted Ranexa 500 mg twice a day.  She continues to be on double antiplatelet therapy.  She presents  for follow-up evaluation.  Past Medical History:  Diagnosis Date  . Allergic rhinitis   .  Asthma    xolair s 8/05 ?11/07; mastered hfa 12/20/08  . Benign positional vertigo   . CAD (coronary artery disease)    a. 09/2014 NSTEMI s/p LHC with sig 2V dz. dLAD diffusely diseased and not suitable for PCI. unsuccessful RCA PCI d/t heavy calcifications  . Depression   . Heart attack    09/2014  . Hyperlipidemia    <130 ldl pos fm hx, bp  . Hypertension   . Obesity   . Osteopenia    dexa 08/22/07 AP spine + 1.1, left femur -1.3, right femur -.8; dexa 10/06/09 +1.6, left femur =1.6, right femur -.  . PONV (postoperative nausea and vomiting)   . Ruptured disc, cervical     Past Surgical History:  Procedure Laterality Date  . BREAST SURGERY  10-26-10   Rt. breast bx--for microcalcifications in rt. retroareolar region--dx was hyalinized fibroadenoma  . BUNIONECTOMY Bilateral   . CERVICAL DISC SURGERY  12/01  . LEFT HEART CATHETERIZATION WITH CORONARY ANGIOGRAM N/A 10/16/2014   Procedure: LEFT HEART CATHETERIZATION WITH CORONARY ANGIOGRAM;  Surgeon: Blane Ohara, MD;  Location: [facility] CATH LAB;  Service: Cardiovascular;  Laterality: N/A;  . PERCUTANEOUS CORONARY ROTOBLATOR INTERVENTION (PCI-R) N/A 10/17/2014   Procedure: PERCUTANEOUS CORONARY ROTOBLATOR INTERVENTION (PCI-R);  Surgeon: Troy Sine, MD;  Location: [facility] CATH LAB;  Service: Cardiovascular;  Laterality: N/A;  . VEIN LIGATION AND STRIPPING    . VIDEO BRONCHOSCOPY Bilateral 02/05/2015   Procedure: VIDEO BRONCHOSCOPY WITHOUT FLUORO;  Surgeon: Tanda Rockers, MD;  Location: [facility];  Service: Endoscopy;  Laterality: Bilateral;  . VULVA /PERINEUM BIOPSY  12-30-10   --epidermoid cyst    Allergies  Allergen Reactions  . Codeine Phosphate Nausea Only  . Aspirin Swelling and Rash  . Brilinta [Ticagrelor] Rash    Started on Ranexa & Brilinta 09/2014 - rash - unsure which it is directly related to  . Diphenhydramine Hcl Rash  . Fish-Derived Products Swelling and Rash  . Penicillins Swelling and Rash    Current Outpatient  Prescriptions  Medication Sig Dispense Refill  . albuterol (PROAIR HFA) 108 (90 BASE) MCG/ACT inhaler Inhale 1-2 puffs into the lungs every 4 (four) hours as needed for wheezing. 1-2 every 4-6 hours as needed for wheezing 18 g 5  . amLODipine (NORVASC) 10 MG tablet TAKE 1 TABLET BY MOUTH EVERY DAY 90 tablet 1  . ARNUITY ELLIPTA 100 MCG/ACT AEPB INHALE 1 PUFF INTO THE LUNGS DAILY 30 each 11  . atorvastatin (LIPITOR) 80 MG tablet TAKE 1 TABLET(80 MG) BY MOUTH DAILY 90 tablet 2  . bisoprolol-hydrochlorothiazide (ZIAC) 5-6.25 MG tablet Take 1 tablet by mouth daily. 30 tablet 11  . cetirizine (ZYRTEC) 10 MG tablet Take 10 mg by mouth at bedtime as needed (itching and sneezing).     . clopidogrel (PLAVIX) 75 MG tablet TAKE 1 TABLET BY MOUTH EVERY DAY 30 tablet 5  . clotrimazole-betamethasone (LOTRISONE) cream Apply  1 application topically 2 (two) times daily as needed (itching).     . Coenzyme Q10 200 MG capsule Take 200 mg by mouth every morning.     Marland Kitchen dextromethorphan-guaiFENesin (MUCINEX DM) 30-600 MG per 12 hr tablet Take 1-2 tablets by mouth every 12 (twelve) hours as needed (cough).     . famotidine (PEPCID) 20 MG tablet Take 20 mg by mouth at bedtime.     . Fluticasone Furoate (ARNUITY ELLIPTA) 100 MCG/ACT AEPB Inhale 1 Inhaler into the lungs daily. 30 each 0  . isosorbide mononitrate (IMDUR) 60 MG 24 hr tablet TAKE 1 AND 1/2 TABLETS( 90 MG) BY MOUTH EVERY MORNING AND 1/2 TABLET( 30 MG) EVERY EVENING 180 tablet 0  . meclizine (ANTIVERT) 25 MG tablet TAKE 2 TABLETS BY MOUTH THREE TIMES DAILY AS NEEDED 24 tablet 0  . montelukast (SINGULAIR) 10 MG tablet Take 1 tablet (10 mg total) by mouth at bedtime. 30 tablet 5  . Multiple Vitamin (MULTIVITAMIN) capsule Take 1 capsule by mouth every morning.     Marland Kitchen omeprazole (PRILOSEC) 20 MG capsule Take 20 mg by mouth daily before breakfast.     . oxymetazoline (AFRIN) 0.05 % nasal spray 2 puffs every 12 hours for 5 days as needed for stuffy nose and sinus  problems    . RANEXA 500 MG 12 hr tablet TAKE 1 TABLET(500 MG) BY MOUTH TWICE DAILY 60 tablet 3  . sodium chloride (OCEAN) 0.65 % nasal spray Place 2 sprays into the nose every 4 (four) hours as needed (nasal congestion).     . SYMBICORT 160-4.5 MCG/ACT inhaler INHALE 2 PUFFS BY MOUTH EVERY 12 HOURS 10.2 g 5  . traMADol (ULTRAM) 50 MG tablet Take 1 tablet (50 mg total) by mouth every 4 (four) hours as needed (severe cough or pain). 40 tablet 0   No current facility-administered medications for this visit.     Social History   Social History  . Marital status: Married    Spouse name: N/A  . Number of children: N/A  . Years of education: N/A   Occupational History  . retired Tour manager    Social History Main Topics  . Smoking status: Never Smoker  . Smokeless tobacco: Never Used  . Alcohol use No  . Drug use: No  . Sexual activity: No   Other Topics Concern  . Not on file   Social History Narrative  . No narrative on file    Family History  Problem Relation Age of Onset  . Diabetes Mother     AODM  . Stroke Mother   . Hypertension Mother   . Heart disease Father   . Breast cancer Daughter 36    dec--mets to liver/spine  . Diabetes Sister     AODM  . Breast cancer Sister   . Hypertension Sister     ROS General: Negative; No fevers, chills, or night sweats HEENT: Negative; No changes in vision or hearing, sinus congestion, difficulty swallowing Pulmonary: positive for asthma; No cough, wheezing, shortness of breath, hemoptysis Cardiovascular: See HPI:  GI: Negative; No nausea, vomiting, diarrhea, or abdominal pain GU: Negative; No dysuria, hematuria, or difficulty voiding Musculoskeletal: Occasional right shoulder discomfort Hematologic: Negative; no easy bruising, bleeding Endocrine: Negative; no heat/cold intolerance; no diabetes, Neuro: Negative; no changes in balance, headaches Skin: Negative; No rashes or skin lesions Psychiatric: Negative; No  behavioral problems, depression Sleep: Negative; No snoring,  daytime sleepiness, hypersomnolence, bruxism, restless legs, hypnogognic hallucinations. Other comprehensive 14 point  system review is negative   Physical Exam BP 131/80 (BP Location: Right Arm, Patient Position: Sitting, Cuff Size: Normal)   Pulse 68   Ht 4' 11" (1.499 m)   Wt 146 lb (66.2 kg)   LMP 12/21/1999   BMI 29.49 kg/m    Repeat blood pressure by me was 128/80  Wt Readings from Last 3 Encounters:  10/04/16 146 lb (66.2 kg)  07/08/16 147 lb 12.8 oz (67 kg)  06/10/16 148 lb (67.1 kg)   General: Alert, oriented, no distress.  Skin: normal turgor, no rashes, warm and dry HEENT: Normocephalic, atraumatic. Pupils equal round and reactive to light; sclera anicteric; extraocular muscles intact, No lid lag; Nose without nasal septal hypertrophy; Mouth/Parynx benign; Mallinpatti scale 3 Neck: No JVD, no carotid bruits; normal carotid upstroke Lungs: clear to ausculatation and percussion bilaterally; no wheezing or rales, normal inspiratory and expiratory effort Chest wall: without tenderness to palpitation Heart: PMI not displaced, RRR, s1 s2 normal, 1/6 systolic murmur, No diastolic murmur, no rubs, gallops, thrills, or heaves Abdomen: soft, nontender; no hepatosplenomehaly, BS+; abdominal aorta nontender and not dilated by palpation. Back: no CVA tenderness Pulses: 2+; Musculoskeletal: full range of motion, normal strength, no joint deformities Extremities: Pulses 2+, no clubbing cyanosis or edema, Homan's sign negative  Neurologic: grossly nonfocal; Cranial nerves grossly wnl Psychologic: Normal mood and affect  ECG (independently read by me): Normal sinus rhythm at 68 bpm.  The right bundle branch block with repolarization changes.  April 2017 ECG (independently read by me): Normal sinus rhythm at 70 bpm.  Right bundle-branch block with repolarization changes.  November 2016 ECG (independently read by me): Normal  sinus rhythm at 70 bpm.  Right bundle-branch block with repolarization changes.  May 2016 ECG (independently read by me):  Normal sinus rhythm at 65 bpm. Right bundle branch block with repolarization changes.  March 2016 ECG (independently read by me): Normal sinus rhythm at 68 bpm.  No ectopy.  No signal been ST segment changes.  December 2015 ECG (independently read by me): Normal sinus rhythm at 77 bpm.  Normal intervals.  No significant ST changes.  November 2015 ECG (independently read by me):sinus rhythm at 88 bpm.  QTc interval 454 ms.  No significant ST-T changes.  LABS:  BMP Latest Ref Rng & Units 06/10/2016 03/25/2016 01/11/2016  Glucose 70 - 99 mg/dL 103(H) 88 109(H)  BUN 6 - 23 mg/dL 9 7 <5(L)  Creatinine 0.40 - 1.20 mg/dL 0.75 0.76 0.66  Sodium 135 - 145 mEq/L 141 140 142  Potassium 3.5 - 5.1 mEq/L 4.0 4.3 3.3(L)  Chloride 96 - 112 mEq/L 102 102 105  CO2 19 - 32 mEq/L 33(H) 28 24  Calcium 8.4 - 10.5 mg/dL 10.0 9.7 9.3    Hepatic Function Latest Ref Rng & Units 03/25/2016 10/30/2015 02/19/2015  Total Protein 6.1 - 8.1 g/dL 7.7 7.5 7.5  Albumin 3.6 - 5.1 g/dL 4.3 4.2 4.2  AST 10 - 35 U/L 21 32 30  ALT 6 - 29 U/L 20 34(H) 28  Alk Phosphatase 33 - 130 U/L 77 86 65  Total Bilirubin 0.2 - 1.2 mg/dL 0.8 0.8 0.8  Bilirubin, Direct 0.0 - 0.3 mg/dL - - -    CBC Latest Ref Rng & Units 06/10/2016 01/11/2016 10/30/2015  WBC 4.0 - 10.5 K/uL 7.9 7.6 6.1  Hemoglobin 12.0 - 15.0 g/dL 13.5 12.8 13.5  Hematocrit 36.0 - 46.0 % 40.0 37.6 40.4  Platelets 150.0 - 400.0 K/uL 440.0(H) 442(H) 438(H)  Lab Results  Component Value Date   MCV 90.1 06/10/2016   MCV 89.1 01/11/2016   MCV 85.8 10/30/2015   Lab Results  Component Value Date   TSH 1.49 06/10/2016  No results found for: HGBA1C  Lipid Panel     Component Value Date/Time   CHOL 198 03/25/2016 1009   TRIG 133 03/25/2016 1009   TRIG 85 10/05/2006 1022   HDL 63 03/25/2016 1009   CHOLHDL 3.1 03/25/2016 1009   VLDL 27 03/25/2016  1009   LDLCALC 108 03/25/2016 1009   RADIOLOGY: No results found.    ASSESSMENT AND PLAN: Cassie Campbell is a 75 year old African-American female who suffered a NSTEMI  in October 2015 which was felt to be due to subtotal high-grade calcified RCA with severe diffuse distal disease beyond her subtotal stenosis.  She also has a severely diffusely diseased mid distal LAD which is not amenable for revascularization.  At the time of her intervention, the smallest balloon catheter that was available in the catheterization laboratory was a 1.20 and this was unable to cross the stenosis.  Since that time, we have been titrating her medical regimen.  She remains asymptomatic without any anginal symptoms on amlodipine 10 mg, bisoprolol 5 mg, isosorbide mononitrate 90 mg in the morning and 30 mg at night in addition to ranexa 500 mg twice a day.  Her ECG is unchanged.  She does have asthma and this has been stable on Symbicort in addition to Singulair.  She is being aggressively treated with atorvastatin 80 mg for hyperlipidemia  LDL had risen to 103 when last checked 4 months ago.  She is on Plavix 75 mg and is tolerating this well without rash.  I have recommended a complete set of follow-up laboratory be obtained in the fasting state.  If her LDL is still not at goal less than 70, Zetia will be added to her medical regimen.  As long as she remains stable, I will see her in 6 months for cardiology reevaluation.  Time spent: 25 minutes  Troy Sine, MD, [facility]  10/04/2016 6:34 PM

## 2016-10-04 NOTE — Patient Instructions (Addendum)
Your physician wants you to follow-up in: 6 months or sooner if needed. You will receive a reminder letter in the mail two months in advance. If you don't receive a letter, please call our office to schedule the follow-up appointment.  If you need a refill on your cardiac medications before your next appointment, please call your pharmacy.   Your physician recommends that you return for lab work today.

## 2016-10-05 LAB — LIPID PANEL
CHOLESTEROL: 167 mg/dL (ref 125–200)
HDL: 62 mg/dL (ref >=46)
LDL Cholesterol: 82 mg/dL (ref <130)
TRIGLYCERIDES: 117 mg/dL (ref <150)
Total CHOL/HDL Ratio: 2.7 Ratio (ref <=5.0)
VLDL: 23 mg/dL (ref <30)

## 2016-10-05 LAB — COMPREHENSIVE METABOLIC PANEL
ALBUMIN: 3.9 g/dL (ref 3.6–5.1)
ALK PHOS: 69 U/L (ref 33–130)
ALT: 20 U/L (ref 6–29)
AST: 19 U/L (ref 10–35)
BUN: 10 mg/dL (ref 7–25)
CALCIUM: 9.4 mg/dL (ref 8.6–10.4)
CHLORIDE: 100 mmol/L (ref 98–110)
CO2: 27 mmol/L (ref 20–31)
Creat: 0.71 mg/dL (ref 0.60–0.93)
Glucose, Bld: 95 mg/dL (ref 65–99)
POTASSIUM: 3.9 mmol/L (ref 3.5–5.3)
Sodium: 140 mmol/L (ref 135–146)
TOTAL PROTEIN: 7 g/dL (ref 6.1–8.1)
Total Bilirubin: 0.8 mg/dL (ref 0.2–1.2)

## 2016-10-06 ENCOUNTER — Ambulatory Visit (INDEPENDENT_AMBULATORY_CARE_PROVIDER_SITE_OTHER): Payer: Medicare Other | Admitting: Internal Medicine

## 2016-10-06 ENCOUNTER — Encounter: Payer: Self-pay | Admitting: Internal Medicine

## 2016-10-06 VITALS — BP 114/60 | HR 74 | Ht 59.0 in | Wt 146.0 lb

## 2016-10-06 DIAGNOSIS — I10 Essential (primary) hypertension: Secondary | ICD-10-CM

## 2016-10-06 DIAGNOSIS — J4540 Moderate persistent asthma, uncomplicated: Secondary | ICD-10-CM | POA: Diagnosis not present

## 2016-10-06 DIAGNOSIS — Z23 Encounter for immunization: Secondary | ICD-10-CM | POA: Diagnosis not present

## 2016-10-06 NOTE — Patient Instructions (Signed)
Try off arnuity to see if any effects on your breathing, coughing or need for albuterol   See calendar for specific medication instructions and bring it back for each and every office visit for every healthcare provider you see.  Without it,  you may not receive the best quality medical care that we feel you deserve.  You will note that the calendar groups together  your maintenance  medications that are timed at particular times of the day.  Think of this as your checklist for what your doctor has instructed you to do until your next evaluation to see what benefit  there is  to staying on a consistent group of medications intended to keep you well.  The other group at the bottom is entirely up to you to use as you see fit  for specific symptoms that may arise between visits that require you to treat them on an as needed basis.  Think of this as your action plan or "what if" list.   Separating the top medications from the bottom group is fundamental to providing you adequate care going forward.     Please schedule a follow up visit in 3 months but call sooner if needed with all medications for new med calendar to see [facility]

## 2016-10-06 NOTE — Progress Notes (Signed)
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 Follow in pulmonary clinic also for primary care with hbp/ hyperlipidemia complicated by MI [id number] and persistent mucus plug in RML/ RLL s/p fob 02/05/15 with marked improvement p lavage and no endobrchial lesions, just diffuse airway swelling      History of Present Illness   10/14/13 [name] > laser rx for back pain  rad legs > resolved    Admit Date: 10/15/2014 Discharge date: 10/19/2014    PRIMARY DISCHARGE DIAGNOSIS:  NSTEMI (non-ST elevated myocardial infarction)   Essential hypertension  GERD (gastroesophageal reflux disease)  Chest pain  Hyperlipidemia  Hypertension  CAD (coronary artery disease)  11/28/2014 acute ov/Lenvil Swaim re: asthma flare despite qvar and symbicort max doses  Chief Complaint  Patient presents with  . Acute Visit    Pt c/o increased wheezing over the past month- esp worse at night.    Wheezing worse at 3 am, sits up and better p saba  > back to sleep,  Onset was indolent and increased as lopressor dose was increased  >>d/c lopressor , rx bisoprolol , pred taper      07/08/2016  f/u ov/Fredonia Casalino re: asthma/ hbp Chief Complaint  Patient presents with  . Follow-up    Breathing is unchanged.  No new co's today.    some worse in heat p ran out of qvar 5 d prior to OV  But no noct symptoms or need for noct saba rec Prednisone 10 mg take  4 each am x 2 days,   2 each am x 2 days,  1 each am x 2 days and stop  Try arnuity 100 one each am in place of qvar but if not better need to check your insurance formulary for alternatives    10/06/2016  f/u ov/Thresea Doble re: asthma/ hbp on symbicort 160 2bid/ arnuity  Chief Complaint  Patient presents with  . Follow-up    Breathing is  overall doing well and she has not had to use rescue inhaler.      Not limited by breathing from desired activities    No obvious other  Patterns in  day to day sob  or obvious daytime variability or assoc excess/ purulent sputum or mucus plugs cp or   overt sinus or hb symptoms. No unusual exp hx or h/o childhood pna/ asthma or knowledge of premature birth.  Sleeping ok without nocturnal  or early am exacerbation  of respiratory  c/o's or need for noct saba. Also denies any obvious fluctuation of symptoms with weather or environmental changes or other aggravating or alleviating factors except as outlined above   Current Medications, Allergies, Complete Past Medical History, Past Surgical History, Family History, and Social History were reviewed in [employer] record.  ROS  The following are not active complaints unless bolded sore throat, dysphagia, dental problems, itching, sneezing,  nasal congestion or excess/ purulent secretions, ear ache,   fever, chills, sweats, unintended wt loss, classically pleuritic or exertional cp, hemoptysis,  orthopnea pnd or leg swelling, presyncope, palpitations, abdominal pain, anorexia, nausea, vomiting, diarrhea  or change in bowel or bladder habits, change in stools or urine, dysuria,hematuria,  rash, arthralgias, visual complaints, headache, numbness, weakness or ataxia or problems  with walking or coordination,  change in mood/affect or memory.              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)  OSTEOPENIA (ICD-733.90)  - DEXA 08/22/07 AP spine + 1.1, left femur -1.3, right femur -.8  - DEXA 10/06/09 + 1.6 left femur -1.6, right femur -.5   -DEXA 2014 no change  HYPERTENSION (ICD-401.9)  HYPERLIPIDEMIA (ICD-272.4)  - Target < 130 ldl pos fm hx, hbp  OBESITY  - Target wt = 178 for BMI < 30  ASTHMA (ICD-493.90) ([name] not active)  -Xolair s 8/05 > 10/2006  -Mastered HFA December 30, 2008  ALLERGIC  RHINITIS (ICD-477.9)  S/p cervical fusion 2000 ............................................................................ [place]...........................................................................Marland KitchenWert  - Td 05/20/2015  - Pneumovax 09/2003 and October 27, 2009  Prevnar 05/07/2014  - Colonscopy 10/28/2004  - CPX 11/19/13     - GYN per [name]'s office -Mammogram 2013 , 2014  MED CALENDAR 05/05/11 , 05/16/2013 , 08/06/2014 , 01/28/2015 , 08/26/2015        Objective:   Physical Exam  Wt 143 11/09/2011 >  04/12/2012  140 > 06/26/2012  139 >  11/13/2012 135 >  137 02/12/2013 >141 05/16/2013 > 07/31/2013  139 >  11/19/2013  134 >135 02/18/2014 > 04/16/2014 134 > 05/07/2014  134 >131 08/06/2014 > 10/30/2014 130 > 11/28/2014 128 > 02/18/2015   133 > 05/20/2015 133 > 07/14/2015   135 >137 08/26/2015 > 11/25/2015 143 > 12/29/2015  137 > 02/23/2016   131 > 06/10/2016   148 > 07/08/2016   148 > 10/06/2016 146   Pleasant amb bf nad/ vital signs reviewed - Note on arrival 02 sats  95% on RA    HEENT: top dentures, nl turbinates, and orophanx. Nl external ear canals without cough reflex   NECK :  without JVD/Nodes/TM/ nl carotid upstrokes bilaterally   LUNGS: no acc muscle use,  slt distant bs, clear to A and P    CV:  RRR  no s3 or murmur or increase in P2, no significant pitting edema   ABD:  soft and nontender with nl excursion in the supine position. No bruits or organomegaly, bowel sounds nl  MS:  warm without deformities, calf tenderness, cyanosis or clubbing  SKIN: clear w/o rash   NEURO:  alert, approp, no deficits on motor/cerbellar testing, nl reflexes/ no nystagmus    CXR PA and Lateral:   06/10/2016 :    I personally reviewed images and agree with radiology impression as follows:    Bibasilar subsegmental atelectasis and/or scarring. Similar findings noted on multiple prior exams.            Assessment & Plan:

## 2016-10-06 NOTE — Assessment & Plan Note (Signed)
(  [name] not active)  -Xolair rx  07/2004 > 10/2006  - IgE  296 11/20/2013  - Prevnar rx 05/07/14  - Spirometry 06/10/2016  FEV1 1.06 (80%)  Ratio 57 - NO   06/10/2016  = 15   - 06/10/2016  After extensive coaching HFA effectiveness =    75%  - IgE  06/10/16  293  - FENO 07/08/2016  =   9   - 07/08/2016 instructed on dpi > changed to arnuity 100 qam due to insurance > try off 10/06/2016    All goals of chronic asthma control met including optimal function and elimination of symptoms with minimal need for rescue therapy.  Contingencies discussed in full including contacting this office immediately if not controlling the symptoms using the rule of two's.      I had an extended discussion with the patient reviewing all relevant studies completed to date and  lasting 15 to 20 minutes of a 25 minute visit    Each maintenance medication was reviewed in detail including most importantly the difference between maintenance and prns and under what circumstances the prns are to be triggered using an action plan format that is not reflected in the computer generated alphabetically organized AVS but trather by a customized med calendar that reflects the AVS meds with confirmed 100% correlation.   Please see instructions for details which were reviewed in writing and the patient given a copy highlighting the part that I personally wrote and discussed at today's ov.

## 2016-10-06 NOTE — Assessment & Plan Note (Signed)
-  Changed lopressor to bisoprolol 11/29/14 due to wheezing  - changed to bisoprolol /hct due to tendency to leg swelling   Adequate control on present rx, reviewed > no change in rx needed

## 2016-10-13 ENCOUNTER — Telehealth: Payer: Self-pay | Admitting: *Deleted

## 2016-10-13 NOTE — Telephone Encounter (Signed)
-----   Message from Troy Sine, MD sent at 10/10/2016 11:22 AM EDT ----- Labs good

## 2016-10-13 NOTE — Telephone Encounter (Signed)
Patient notified of recent lab results. No changes in therapy was recommended.

## 2016-10-17 ENCOUNTER — Other Ambulatory Visit: Payer: Self-pay | Admitting: Cardiovascular Disease

## 2016-10-27 ENCOUNTER — Encounter (INDEPENDENT_AMBULATORY_CARE_PROVIDER_SITE_OTHER): Payer: Self-pay | Admitting: Specialist

## 2016-10-27 ENCOUNTER — Ambulatory Visit (INDEPENDENT_AMBULATORY_CARE_PROVIDER_SITE_OTHER): Payer: Medicare Other | Admitting: Specialist

## 2016-10-27 VITALS — BP 131/79 | HR 82 | Ht 59.0 in | Wt 146.0 lb

## 2016-10-27 DIAGNOSIS — K219 Gastro-esophageal reflux disease without esophagitis: Secondary | ICD-10-CM

## 2016-10-27 DIAGNOSIS — M4316 Spondylolisthesis, lumbar region: Secondary | ICD-10-CM

## 2016-10-27 DIAGNOSIS — M4726 Other spondylosis with radiculopathy, lumbar region: Secondary | ICD-10-CM

## 2016-10-27 DIAGNOSIS — M48062 Spinal stenosis, lumbar region with neurogenic claudication: Secondary | ICD-10-CM

## 2016-10-27 DIAGNOSIS — I2583 Coronary atherosclerosis due to lipid rich plaque: Secondary | ICD-10-CM

## 2016-10-27 DIAGNOSIS — I2510 Atherosclerotic heart disease of native coronary artery without angina pectoris: Secondary | ICD-10-CM | POA: Diagnosis not present

## 2016-10-27 NOTE — Patient Instructions (Addendum)
Avoid bending, stooping and avoid lifting weights greater than 10 lbs. Avoid prolong standing and walking. Avoid frequent bending and stooping  No lifting greater than 10 lbs. May use ice or moist heat for pain. Weight loss is of benefit. Handicap license is approved. Dr. Romona Curls secretary/Assistant will call to arrange for epidural steroid injection left L4 transforamenal.

## 2016-10-27 NOTE — Progress Notes (Addendum)
Office Visit Note   Patient: Cassie Campbell           Date of Birth: [birth year]           MRN: [id number] Visit Date: 10/27/2016              Requested by: Tanda Rockers, MD [address], [place] [address] PCP: Christinia Gully, MD   Assessment & Plan: Visit Diagnoses:  1. Spinal stenosis of lumbar region with neurogenic claudication   2. Spondylolisthesis, lumbar region   3. Other spondylosis with radiculopathy, lumbar region   4. Coronary artery disease due to lipid rich plaque   5. Gastroesophageal reflux disease without esophagitis   This patient seen today having recurring pain in her back and radiation lower extremity. In 2014 had radiofrequency ablation lumbar facet joints for similar pain with good relief for nearly 2-3 years. Most recent facet blocks however have not relieved her pain so that the discomfort she is experiencing in the left L4 distribution is perhaps secondary to neurogenic pain more than facet mediated discomfort. We'll plan to obtain left-sided transforaminal L4 ESI and hopefully this will relieve her discomfort. If not then consideration may be given to performing decompression and fusion L4-5 spondylolisthesis with severe spinal stenosis.  Plan: Avoid bending, stooping and avoid lifting weights greater than 10 lbs. Avoid prolong standing and walking. Avoid frequent bending and stooping  No lifting greater than 10 lbs. May use ice or moist heat for pain. Weight loss is of benefit. Handicap license is approved. Dr. Romona Curls secretary/Assistant will call to arrange for epidural steroid injection left L4 transforamenal.   Follow-Up Instructions: Return in about 4 weeks (around 11/24/2016) for Follow up of ESI and stensis lumbar spine.   Orders:  Orders Placed This Encounter  Procedures  . Ambulatory referral to Physical Medicine Rehab   No orders of the defined types were placed in this encounter.     Procedures: No procedures  performed   Clinical Data: Findings:  MRI of lumbar spine 08/2016 shows grade 1 Spondylolisthesis L4-5 with severe spine stenosis. Mild lateral recess stenosis L3-4. Severe spondylosis of the facets at the L4-5 and L5-S1 levels.    Subjective: Chief Complaint  Patient presents with  . Lower Back - Pain, Follow-up    Patient is returning today to follow up on lumbar facet injection done on 09/15/2016. Patient states injection helped some with the back pain but the left thigh is still giving her a fit. Complains with some numbness in left leg/foot area. Feels at times her legs want to buckle on her.     Review of Systems  Constitutional: Positive for unexpected weight change. Negative for activity change, appetite change, chills, diaphoresis, fatigue and fever.  HENT: Positive for congestion, postnasal drip, rhinorrhea, sinus pain, sinus pressure and sneezing. Negative for dental problem, drooling, ear discharge, ear pain, facial swelling, hearing loss, mouth sores, nosebleeds, sore throat, tinnitus, trouble swallowing and voice change.   Eyes: Positive for visual disturbance. Negative for photophobia, pain, discharge, redness and itching.  Respiratory: Positive for shortness of breath and wheezing. Negative for apnea, cough, choking, chest tightness and stridor.   Cardiovascular: Negative.   Gastrointestinal: Negative.   Endocrine: Negative for cold intolerance, heat intolerance, polydipsia, polyphagia and polyuria.  Genitourinary: Positive for enuresis. Negative for difficulty urinating, dyspareunia, dysuria, flank pain and frequency.  Musculoskeletal: Positive for back pain and gait problem. Negative for arthralgias, joint swelling, myalgias and neck pain.  Skin:  Negative.   Allergic/Immunologic: Positive for environmental allergies and food allergies.  Neurological: Positive for dizziness, syncope, weakness and numbness. Negative for light-headedness.  Hematological: Negative.    Psychiatric/Behavioral: Positive for dysphoric mood and sleep disturbance. Negative for agitation, behavioral problems, confusion, decreased concentration, hallucinations, self-injury and suicidal ideas. The patient is not nervous/anxious and is not hyperactive.      Objective: Vital Signs: BP 131/79   Pulse 82   Ht 4\' 11"  (1.499 m)   Wt 146 lb (66.2 kg)   LMP 12/21/1999   BMI 29.49 kg/m   Physical Exam  Constitutional: She is oriented to person, place, and time. She appears well-developed and well-nourished.  HENT:  Head: Normocephalic and atraumatic.  Eyes: EOM are normal. Pupils are equal, round, and reactive to light.  Neck: Normal range of motion. Neck supple. No JVD present. No tracheal deviation present.  Pulmonary/Chest: Effort normal and breath sounds normal.  Abdominal: Soft. Bowel sounds are normal.  Musculoskeletal: She exhibits tenderness.  Lymphadenopathy:    She has no cervical adenopathy.  Neurological: She is alert and oriented to person, place, and time. She displays normal reflexes. No cranial nerve deficit or sensory deficit. She exhibits normal muscle tone. Coordination normal.  Skin: Skin is warm and dry.  Psychiatric: She has a normal mood and affect. Her behavior is normal. Thought content normal.    Back Exam   Tenderness  The patient is experiencing tenderness in the lumbar.  Range of Motion  Extension: normal  Flexion: normal   Muscle Strength  Right Quadriceps:  5/5  Left Quadriceps:  5/5  Right Hamstrings:  5/5  Left Hamstrings:  5/5   Tests  Straight leg raise right: negative Straight leg raise left: negative  Reflexes  Patellar: 0/4 Achilles: 0/4 Babinski's sign: normal   Other  Toe Walk: normal Heel Walk: normal Sensation: normal Gait: antalgic   Comments:  Has difficulty raising the left leg and has to ull left leg up to cross left leg over the right. Hip ROM is pain free clinically with  Normal internal  rotation.      Specialty Comments:  No specialty comments available.  Imaging: No results found.   PMFS History: Patient Active Problem List   Diagnosis Date Noted  . Dyspnea 06/10/2016  . Muscle strain of scapular region 01/22/2016  . CAD in native artery 10/28/2015  . Tinea cruris 07/20/2015  . Right bundle branch block 04/28/2015  . Pain in the chest   . Chest pain, atypical 11/06/2014  . HCAP (healthcare-associated pneumonia) 11/05/2014  . CAD (coronary artery disease)   . Chest pain 10/15/2014  . NSTEMI (non-ST elevated myocardial infarction) ([facility]) 10/15/2014  . Health care maintenance 10/22/2013  . Arthralgia of hip 02/12/2013  . GERD (gastroesophageal reflux disease) 03/10/2012  . Intertriginous candidiasis 11/09/2011  . Atelectasis 02/24/2009  . BENIGN POSITIONAL VERTIGO 11/10/2007  . Hyperlipidemia with target LDL less than 70 10/05/2007  . Essential hypertension 10/05/2007  . ALLERGIC RHINITIS 10/05/2007  . Moderate persistent asthma in adult without complication 123456  . Disorder of bone and cartilage 10/05/2007   Past Medical History:  Diagnosis Date  . Allergic rhinitis   . Asthma    xolair s 8/05 ?11/07; mastered hfa 12/20/08  . Benign positional vertigo   . CAD (coronary artery disease)    a. 09/2014 NSTEMI s/p LHC with sig 2V dz. dLAD diffusely diseased and not suitable for PCI. unsuccessful RCA PCI d/t heavy calcifications  . Depression   .  Heart attack    09/2014  . Hyperlipidemia    <130 ldl pos fm hx, bp  . Hypertension   . Obesity   . Osteopenia    dexa 08/22/07 AP spine + 1.1, left femur -1.3, right femur -.8; dexa 10/06/09 +1.6, left femur =1.6, right femur -.  . PONV (postoperative nausea and vomiting)   . Ruptured disc, cervical     Family History  Problem Relation Age of Onset  . Diabetes Mother     AODM  . Stroke Mother   . Hypertension Mother   . Heart disease Father   . Breast cancer Daughter 36    dec--mets to  liver/spine  . Diabetes Sister     AODM  . Breast cancer Sister   . Hypertension Sister     Past Surgical History:  Procedure Laterality Date  . BREAST SURGERY  10-26-10   Rt. breast bx--for microcalcifications in rt. retroareolar region--dx was hyalinized fibroadenoma  . BUNIONECTOMY Bilateral   . CERVICAL DISC SURGERY  12/01  . LEFT HEART CATHETERIZATION WITH CORONARY ANGIOGRAM N/A 10/16/2014   Procedure: LEFT HEART CATHETERIZATION WITH CORONARY ANGIOGRAM;  Surgeon: Blane Ohara, MD;  Location: [facility] CATH LAB;  Service: Cardiovascular;  Laterality: N/A;  . PERCUTANEOUS CORONARY ROTOBLATOR INTERVENTION (PCI-R) N/A 10/17/2014   Procedure: PERCUTANEOUS CORONARY ROTOBLATOR INTERVENTION (PCI-R);  Surgeon: Troy Sine, MD;  Location: [facility] CATH LAB;  Service: Cardiovascular;  Laterality: N/A;  . VEIN LIGATION AND STRIPPING    . VIDEO BRONCHOSCOPY Bilateral 02/05/2015   Procedure: VIDEO BRONCHOSCOPY WITHOUT FLUORO;  Surgeon: Tanda Rockers, MD;  Location: [facility] ENDOSCOPY;  Service: Endoscopy;  Laterality: Bilateral;  . VULVA /PERINEUM BIOPSY  12-30-10   --epidermoid cyst   Social History   Occupational History  . retired Tour manager    Social History Main Topics  . Smoking status: Never Smoker  . Smokeless tobacco: Never Used  . Alcohol use No  . Drug use: No  . Sexual activity: No

## 2016-10-28 ENCOUNTER — Telehealth (INDEPENDENT_AMBULATORY_CARE_PROVIDER_SITE_OTHER): Payer: Self-pay | Admitting: Physical Medicine and Rehabilitation

## 2016-10-28 ENCOUNTER — Telehealth: Payer: Self-pay | Admitting: Cardiovascular Disease

## 2016-10-28 NOTE — Telephone Encounter (Signed)
Called Dr. Evette Georges office and they said it was best if they sent a message to him requesting auth for patient to hold Plavix for 7 days prior to [facility]. They will fax a form to our office when he gives the OK.

## 2016-10-28 NOTE — Telephone Encounter (Signed)
New message      Request for surgical clearance:  What type of surgery is being performed? Epidural steroid injection 1. When is this surgery scheduled? Pending clearance  Are there any medications that need to be held prior to surgery and how long? Hold plavix 7 days prior Name of physician performing surgery?  Dr Ernestina Patches 2. What is your office phone and fax number?  Fax [phone]

## 2016-11-08 ENCOUNTER — Other Ambulatory Visit: Payer: Self-pay | Admitting: Internal Medicine

## 2016-11-14 NOTE — Telephone Encounter (Signed)
Ok to hold plavix 

## 2016-11-15 NOTE — Telephone Encounter (Signed)
Scheduled for 11/23/16 at 130 with driver and will hold Plavix.

## 2016-11-23 ENCOUNTER — Ambulatory Visit (INDEPENDENT_AMBULATORY_CARE_PROVIDER_SITE_OTHER): Payer: Medicare Other | Admitting: Physical Medicine and Rehabilitation

## 2016-11-23 ENCOUNTER — Encounter (INDEPENDENT_AMBULATORY_CARE_PROVIDER_SITE_OTHER): Payer: Self-pay | Admitting: Physical Medicine and Rehabilitation

## 2016-11-23 VITALS — BP 131/79 | HR 85 | Temp 98.0°F

## 2016-11-23 DIAGNOSIS — M48062 Spinal stenosis, lumbar region with neurogenic claudication: Secondary | ICD-10-CM

## 2016-11-23 MED ORDER — LIDOCAINE HCL (PF) 1 % IJ SOLN
0.3300 mL | Freq: Once | INTRAMUSCULAR | Status: AC
  Administered 2016-11-23: 0.3 mL

## 2016-11-23 MED ORDER — METHYLPREDNISOLONE ACETATE 80 MG/ML IJ SUSP
80.0000 mg | Freq: Once | INTRAMUSCULAR | Status: AC
  Administered 2016-11-23: 80 mg

## 2016-11-23 NOTE — Progress Notes (Signed)
Cassie Campbell - 75 y.o. female MRN [id number]  Date of birth: [birth year]  Office Visit Note: Visit Date: 11/23/2016 PCP: Christinia Gully, MD Referred by: Tanda Rockers, MD  Subjective: Chief Complaint  Patient presents with  . Lower Back - Pain   HPI: Cassie Campbell is a 75 year old female with severe osteoarthritis of the lumbar spine. Prior facet joint blocks and radiofrequency ablation did well several years ago. This recent facet block was not very beneficial in new MRI shows worsening stenosis at L4-5. Most of her symptoms are on the left hip and leg. There are mostly radicular at this point.   Numbness lower leg to top of foot. Sharp pains at times into thigh. This happens a lot when she coughs or sneezes. Leg feels weak at times. Mainly when she first gets up.  Plavix d/c'd 11/16/16   ROS Otherwise per HPI.  Assessment & Plan: Visit Diagnoses:  1. Spinal stenosis of lumbar region with neurogenic claudication     Plan: Findings:  Left L4 transforaminal epidural steroid injection with fluoroscopic guidance.    Meds & Orders:  Meds ordered this encounter  Medications  . lidocaine (PF) (XYLOCAINE) 1 % injection 0.3 mL  . methylPREDNISolone acetate (DEPO-MEDROL) injection 80 mg    Orders Placed This Encounter  Procedures  . Epidural Steroid injection    Follow-up: Return for Follow-up with Dr. Louanne Skye as scheduled.   Procedures: No procedures performed  Lumbosacral Transforaminal Epidural Steroid Injection - Infraneural Approach with Fluoroscopic Guidance  Patient: Cassie Campbell      Date of Birth: [birth year] MRN: [id number] PCP: Christinia Gully, MD      Visit Date: 11/23/2016   Universal Protocol:    Date/Time: 12/05/171:53 PM  Consent Given By: the patient  Position: PRONE   Additional Comments: Vital signs were monitored before and after the procedure. Patient was prepped and draped in the usual sterile fashion. The correct patient, procedure, and site  was verified.   Injection Procedure Details:  Procedure Site One Meds Administered:  Meds ordered this encounter  Medications  . lidocaine (PF) (XYLOCAINE) 1 % injection 0.3 mL  . methylPREDNISolone acetate (DEPO-MEDROL) injection 80 mg      Laterality: Left  Location/Site:  L4-L5  Needle size: 22 G  Needle type: Spinal  Needle Placement: Transforaminal  Findings:  -Contrast Used: 1 mL iohexol 180 mg iodine/mL   -Comments: Excellent flow of contrast along the nerve and into the epidural space.  Procedure Details: After squaring off the end-plates of the desired vertebral level to get a true AP view, the C-arm was obliqued to the painful side so that the superior articulating process is positioned about 1/3 the length of the inferior endplate.  The needle was aimed toward the junction of the superior articular process and the transverse process of the inferior vertebrae. The needle's initial entry is in the lower third of the foramen through Kambin's triangle. The soft tissues overlying this target were infiltrated with 2-3 ml. of 1% Lidocaine without Epinephrine.  The spinal needle was then inserted and advanced toward the target using a "trajectory" view along the fluoroscope beam.  Under AP and lateral visualization, the needle was advanced so it did not puncture dura and did not traverse medially beyond the 6 o'clock position of the pedicle. Bi-planar projections were used to confirm position. Aspiration was confirmed to be negative for CSF and/or blood. A 1-2 ml. volume of Isovue-250 was injected and flow of contrast was noted  at each level. Radiographs were obtained for documentation purposes.   After attaining the desired flow of contrast documented above, a 0.5 to 1.0 ml test dose of 0.25% Marcaine was injected into each respective transforaminal space.  The patient was observed for 90 seconds post injection.  After no sensory deficits were reported, and normal lower extremity  motor function was noted,   the above injectate was administered so that equal amounts of the injectate were placed at each foramen (level) into the transforaminal epidural space.   Additional Comments:  The patient tolerated the procedure well Dressing: Band-Aid    Post-procedure details: Patient was observed during the procedure. Post-procedure instructions were reviewed.  Patient left the clinic in stable condition.   Clinical History: No specialty comments available.  She reports that she has never smoked. She has never used smokeless tobacco. No results for input(s): HGBA1C, LABURIC in the last 8760 hours.  Objective:  VS:  HT:    WT:   BMI:     BP:131/79  HR:85bpm  TEMP:98 F (36.7 C)( )  RESP:95 % Physical Exam  Musculoskeletal:  She ambulates without aid with a fairly normal 8. She has good distal strength.    Ortho Exam Imaging: No results found.  Past Medical/Family/Surgical/Social History: Medications & Allergies reviewed per EMR Patient Active Problem List   Diagnosis Date Noted  . Dyspnea 06/10/2016  . Muscle strain of scapular region 01/22/2016  . CAD in native artery 10/28/2015  . Tinea cruris 07/20/2015  . Right bundle branch block 04/28/2015  . Pain in the chest   . Chest pain, atypical 11/06/2014  . HCAP (healthcare-associated pneumonia) 11/05/2014  . CAD (coronary artery disease)   . Chest pain 10/15/2014  . NSTEMI (non-ST elevated myocardial infarction) ([place]) 10/15/2014  . Health care maintenance 10/22/2013  . Arthralgia of hip 02/12/2013  . GERD (gastroesophageal reflux disease) 03/10/2012  . Intertriginous candidiasis 11/09/2011  . Atelectasis 02/24/2009  . BENIGN POSITIONAL VERTIGO 11/10/2007  . Hyperlipidemia with target LDL less than 70 10/05/2007  . Essential hypertension 10/05/2007  . ALLERGIC RHINITIS 10/05/2007  . Moderate persistent asthma in adult without complication 123456  . Disorder of bone and cartilage 10/05/2007    Past Medical History:  Diagnosis Date  . Allergic rhinitis   . Asthma    xolair s 8/05 ?11/07; mastered hfa 12/20/08  . Benign positional vertigo   . CAD (coronary artery disease)    a. 09/2014 NSTEMI s/p LHC with sig 2V dz. dLAD diffusely diseased and not suitable for PCI. unsuccessful RCA PCI d/t heavy calcifications  . Depression   . Heart attack    09/2014  . Hyperlipidemia    <130 ldl pos fm hx, bp  . Hypertension   . Obesity   . Osteopenia    dexa 08/22/07 AP spine + 1.1, left femur -1.3, right femur -.8; dexa 10/06/09 +1.6, left femur =1.6, right femur -.  . PONV (postoperative nausea and vomiting)   . Ruptured disc, cervical    Family History  Problem Relation Age of Onset  . Diabetes Mother     AODM  . Stroke Mother   . Hypertension Mother   . Heart disease Father   . Breast cancer Daughter 36    dec--mets to liver/spine  . Diabetes Sister     AODM  . Breast cancer Sister   . Hypertension Sister    Past Surgical History:  Procedure Laterality Date  . BREAST SURGERY  10-26-10   Rt. breast  bx--for microcalcifications in rt. retroareolar region--dx was hyalinized fibroadenoma  . BUNIONECTOMY Bilateral   . CERVICAL DISC SURGERY  12/01  . LEFT HEART CATHETERIZATION WITH CORONARY ANGIOGRAM N/A 10/16/2014   Procedure: LEFT HEART CATHETERIZATION WITH CORONARY ANGIOGRAM;  Surgeon: Blane Ohara, MD;  Location: [facility] CATH LAB;  Service: Cardiovascular;  Laterality: N/A;  . PERCUTANEOUS CORONARY ROTOBLATOR INTERVENTION (PCI-R) N/A 10/17/2014   Procedure: PERCUTANEOUS CORONARY ROTOBLATOR INTERVENTION (PCI-R);  Surgeon: Troy Sine, MD;  Location[facility] CATH LAB;  Service: Cardiovascular;  Laterality: N/A;  . VEIN LIGATION AND STRIPPING    . VIDEO BRONCHOSCOPY Bilateral 02/05/2015   Procedure: VIDEO BRONCHOSCOPY WITHOUT FLUORO;  Surgeon: Tanda Rockers, MD;  Location: [facility] ENDOSCOPY;  Service: Endoscopy;  Laterality: Bilateral;  . VULVA /PERINEUM BIOPSY  12-30-10   --epidermoid  cyst   Social History   Occupational History  . retired Tour manager    Social History Main Topics  . Smoking status: Never Smoker  . Smokeless tobacco: Never Used  . Alcohol use No  . Drug use: No  . Sexual activity: No

## 2016-11-23 NOTE — Procedures (Signed)
Lumbosacral Transforaminal Epidural Steroid Injection - Infraneural Approach with Fluoroscopic Guidance  Patient: Cassie Campbell      Date of Birth: [birth year] MRN: [id number] PCP: Christinia Gully, MD      Visit Date: 11/23/2016   Universal Protocol:    Date/Time: 12/05/171:53 PM  Consent Given By: the patient  Position: PRONE   Additional Comments: Vital signs were monitored before and after the procedure. Patient was prepped and draped in the usual sterile fashion. The correct patient, procedure, and site was verified.   Injection Procedure Details:  Procedure Site One Meds Administered:  Meds ordered this encounter  Medications  . lidocaine (PF) (XYLOCAINE) 1 % injection 0.3 mL  . methylPREDNISolone acetate (DEPO-MEDROL) injection 80 mg      Laterality: Left  Location/Site:  L4-L5  Needle size: 22 G  Needle type: Spinal  Needle Placement: Transforaminal  Findings:  -Contrast Used: 1 mL iohexol 180 mg iodine/mL   -Comments: Excellent flow of contrast along the nerve and into the epidural space.  Procedure Details: After squaring off the end-plates of the desired vertebral level to get a true AP view, the C-arm was obliqued to the painful side so that the superior articulating process is positioned about 1/3 the length of the inferior endplate.  The needle was aimed toward the junction of the superior articular process and the transverse process of the inferior vertebrae. The needle's initial entry is in the lower third of the foramen through Kambin's triangle. The soft tissues overlying this target were infiltrated with 2-3 ml. of 1% Lidocaine without Epinephrine.  The spinal needle was then inserted and advanced toward the target using a "trajectory" view along the fluoroscope beam.  Under AP and lateral visualization, the needle was advanced so it did not puncture dura and did not traverse medially beyond the 6 o'clock position of the pedicle. Bi-planar projections  were used to confirm position. Aspiration was confirmed to be negative for CSF and/or blood. A 1-2 ml. volume of Isovue-250 was injected and flow of contrast was noted at each level. Radiographs were obtained for documentation purposes.   After attaining the desired flow of contrast documented above, a 0.5 to 1.0 ml test dose of 0.25% Marcaine was injected into each respective transforaminal space.  The patient was observed for 90 seconds post injection.  After no sensory deficits were reported, and normal lower extremity motor function was noted,   the above injectate was administered so that equal amounts of the injectate were placed at each foramen (level) into the transforaminal epidural space.   Additional Comments:  The patient tolerated the procedure well Dressing: Band-Aid    Post-procedure details: Patient was observed during the procedure. Post-procedure instructions were reviewed.  Patient left the clinic in stable condition.

## 2016-11-23 NOTE — Patient Instructions (Signed)

## 2016-12-01 ENCOUNTER — Encounter: Payer: Self-pay | Admitting: *Deleted

## 2016-12-08 ENCOUNTER — Ambulatory Visit (INDEPENDENT_AMBULATORY_CARE_PROVIDER_SITE_OTHER): Payer: Medicare Other | Admitting: Specialist

## 2016-12-11 ENCOUNTER — Other Ambulatory Visit: Payer: Self-pay | Admitting: Cardiovascular Disease

## 2016-12-14 NOTE — Telephone Encounter (Signed)
REFILL 

## 2017-01-07 ENCOUNTER — Encounter: Payer: Medicare Other | Admitting: Adult Health

## 2017-01-11 ENCOUNTER — Ambulatory Visit (INDEPENDENT_AMBULATORY_CARE_PROVIDER_SITE_OTHER): Payer: Medicare Other | Admitting: Adult Health

## 2017-01-11 ENCOUNTER — Encounter: Payer: Self-pay | Admitting: Adult Health

## 2017-01-11 DIAGNOSIS — J4540 Moderate persistent asthma, uncomplicated: Secondary | ICD-10-CM | POA: Diagnosis not present

## 2017-01-11 DIAGNOSIS — M48061 Spinal stenosis, lumbar region without neurogenic claudication: Secondary | ICD-10-CM | POA: Diagnosis not present

## 2017-01-11 DIAGNOSIS — I10 Essential (primary) hypertension: Secondary | ICD-10-CM | POA: Diagnosis not present

## 2017-01-11 DIAGNOSIS — E785 Hyperlipidemia, unspecified: Secondary | ICD-10-CM

## 2017-01-11 MED ORDER — TRAMADOL HCL 50 MG PO TABS: 50.0000 mg | ORAL_TABLET | ORAL | 0 refills | Status: DC | PRN

## 2017-01-11 NOTE — Assessment & Plan Note (Addendum)
Low-fat, low-cholesterol diet Continue on current regimen

## 2017-01-11 NOTE — Assessment & Plan Note (Signed)
Continue on current regimen well-controlled 

## 2017-01-11 NOTE — Assessment & Plan Note (Signed)
Chronic back pain with L4-L5 severe spinal stenosis. Patient continues to have difficulties with pain and mobility along with leg weakness. Patient is to follow back up with Dr. neck for further evaluation and possible treatment options.

## 2017-01-11 NOTE — Assessment & Plan Note (Signed)
Well controlled on her current regimen. No flare off of Qvar.  pLAN Patient's continue on her current regimen. Follow-up in 3-4 months and as needed

## 2017-01-11 NOTE — Progress Notes (Signed)
@Patient  ID: Cassie Campbell, female    DOB: 1941/10/24, 76 y.o.   MRN: [id number]  Chief Complaint  Patient presents with  . 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 Follow in pulmonary clinic also for primary care with hbp/ hyperlipidemia complicated by MI (CAD )  10/15/14 and persistent mucus plug in RML/ RLL s/p fob 02/05/15 with marked improvement p lavage and no endobrchial lesions, just diffuse airway swelling .   TEST  BENIGN POSITIONAL VERTIGO (ICD-386.11)  OSTEOPENIA (ICD-733.90)  - DEXA 08/22/07 AP spine + 1.1, left femur -1.3, right femur -.8  - DEXA 10/06/09 + 1.6 left femur -1.6, right femur -.5   -DEXA 2014 no change  HYPERTENSION (ICD-401.9)  HYPERLIPIDEMIA (ICD-272.4)  - Target < 100 ldl pos fm hx, hbp , MI  CAD -MI 2015 , 09/2014 NSTEMI s/p LHC with sig 2V dz. dLAD diffusely diseased and not suitable for PCI. unsuccessful RCA PCI d/t heavy calcifications OBESITY  - Target wt = 178 for BMI < 30  ASTHMA (ICD-493.90) ([name] not active)  -Xolair s 8/05 > 10/2006  -Mastered HFA December 30, 2008  ALLERGIC RHINITIS (ICD-477.9)  S/p cervical fusion 2000 ............................................................................ [place]...........................................................................[place]  - Td 05/20/2015  - Pneumovax 09/2003 and October 27, 2009  Prevnar 05/07/2014  - Colonscopy 10/28/2004  - CPX 11/19/13     - GYN per [name]'s office -Mammogram 2013 , 2014  MED CALENDAR 05/05/11 , 05/16/2013 , 08/06/2014 , 01/28/2015 , 08/26/2015 01/11/2017   01/11/2017  Follow up : Asthma/HTN/Dyslipidemia/DJD/DDD Patient returns for a three-month follow-up Patient has known severe chronic asthma. She has been well controlled since last visit with no flare  of cough or wheezing. She denies any shortness of breath. Patient says she gets an occasional wheeze but has been very mild. She is not using any of her albuterol. She is currently on Symbicort and Singulair. Previously was on Qvar and trial of Arnuity but these were stopped. She did not flare off these additional ICS  Inhalers.   She has known history of hypertension, hyperlipidemia and coronary artery disease. Blood pressure has been well controlled. She denies any chest pain or anginal type symptoms. Follows with cardiology on a regular basis. We discussed healthy diet and exercise.  Patient does have chronic back pain. She is being followed by orthopedics with Dr. Louanne Skye.   She had an MRI of her lumbar spine in November. This showed L4-L5 with severe spinal stenosis. He did have an epidural steroid injection. However, this did not help with her pain or mobility. Patient is trying to go to the [facility] a couple times a week. But has not seen any improvement in her pain. She has significant pain and some weakness in her legs, especially on the left.   Allergies  Allergen Reactions  . Codeine Phosphate Nausea Only  . Aspirin Swelling and Rash  . Brilinta [Ticagrelor] Rash    Started on Ranexa & Brilinta 09/2014 - rash - unsure which it is directly related to  . Diphenhydramine Hcl Rash  . Fish-Derived Products Swelling and Rash  . Penicillins Swelling and Rash    Immunization History  Administered Date(s) Administered  . Influenza Split 11/09/2011, 11/13/2012, 09/19/2014  . Influenza Whole 10/04/2007, 10/27/2009, 10/20/2010  . Influenza, High  Dose Seasonal PF 10/06/2016  . Influenza,inj,Quad PF,36+ Mos 11/19/2013, 09/19/2014, 08/26/2015  . Pneumococcal Conjugate-13 05/07/2014  . Pneumococcal Polysaccharide-23 09/20/2003, 10/27/2009  . Tdap 09/19/2005, 05/20/2015    Past Medical History:  Diagnosis Date  . Allergic rhinitis   . Asthma    xolair s 8/05 ?11/07; mastered hfa 12/20/08  .  Benign positional vertigo   . CAD (coronary artery disease)    a. 09/2014 NSTEMI s/p LHC with sig 2V dz. dLAD diffusely diseased and not suitable for PCI. unsuccessful RCA PCI d/t heavy calcifications  . Depression   . Heart attack    09/2014  . Hyperlipidemia    <130 ldl pos fm hx, bp  . Hypertension   . Obesity   . Osteopenia    dexa 08/22/07 AP spine + 1.1, left femur -1.3, right femur -.8; dexa 10/06/09 +1.6, left femur =1.6, right femur -.  . PONV (postoperative nausea and vomiting)   . Ruptured disc, cervical     Tobacco History: History  Smoking Status  . Never Smoker  Smokeless Tobacco  . Never Used   Counseling given: Not Answered   Outpatient Encounter Prescriptions as of 01/11/2017  Medication Sig  . albuterol (PROAIR HFA) 108 (90 BASE) MCG/ACT inhaler Inhale 1-2 puffs into the lungs every 4 (four) hours as needed for wheezing. 1-2 every 4-6 hours as needed for wheezing  . amLODipine (NORVASC) 10 MG tablet TAKE 1 TABLET BY MOUTH EVERY DAY  . ARNUITY ELLIPTA 100 MCG/ACT AEPB INHALE 1 PUFF INTO THE LUNGS DAILY  . atorvastatin (LIPITOR) 80 MG tablet TAKE 1 TABLET(80 MG) BY MOUTH DAILY  . bisoprolol-hydrochlorothiazide (ZIAC) 5-6.25 MG tablet Take 1 tablet by mouth daily.  . budesonide-formoterol (SYMBICORT) 160-4.5 MCG/ACT inhaler Inhale 1-2 puffs into the lungs 2 (two) times daily.  . cetirizine (ZYRTEC) 10 MG tablet Take 10 mg by mouth at bedtime as needed (itching and sneezing).   . clopidogrel (PLAVIX) 75 MG tablet TAKE 1 TABLET BY MOUTH EVERY DAY  . clotrimazole-betamethasone (LOTRISONE) cream Apply 1 application topically 2 (two) times daily as needed (itching).   . Coenzyme Q10 200 MG capsule Take 200 mg by mouth every morning.   Marland Kitchen dextromethorphan-guaiFENesin (MUCINEX DM) 30-600 MG per 12 hr tablet Take 1-2 tablets by mouth every 12 (twelve) hours as needed (cough).   . famotidine (PEPCID) 20 MG tablet Take 20 mg by mouth at bedtime.   . Fluticasone Furoate  (ARNUITY ELLIPTA) 100 MCG/ACT AEPB Inhale 1 Inhaler into the lungs daily.  . isosorbide mononitrate (IMDUR) 60 MG 24 hr tablet TAKE 1 AND 1/2 TABLETS( 90 MG) BY MOUTH EVERY MORNING AND 1/2 TABLET( 30 MG) EVERY EVENING  . meclizine (ANTIVERT) 25 MG tablet TAKE 2 TABLETS BY MOUTH THREE TIMES DAILY AS NEEDED  . montelukast (SINGULAIR) 10 MG tablet Take 1 tablet (10 mg total) by mouth at bedtime.  . Multiple Vitamin (MULTIVITAMIN) capsule Take 1 capsule by mouth every morning.   Marland Kitchen omeprazole (PRILOSEC) 20 MG capsule Take 20 mg by mouth daily before breakfast.   . oxymetazoline (AFRIN) 0.05 % nasal spray 2 puffs every 12 hours for 5 days as needed for stuffy nose and sinus problems  . RANEXA 500 MG 12 hr tablet TAKE 1 TABLET(500 MG) BY MOUTH TWICE DAILY  . sodium chloride (OCEAN) 0.65 % nasal spray Place 2 sprays into the nose every 4 (four) hours as needed (nasal congestion).   . traMADol (ULTRAM) 50 MG tablet Take 1 tablet (50 mg total) by  mouth every 4 (four) hours as needed (severe cough or pain).   No facility-administered encounter medications on file as of 01/11/2017.      Review of Systems  Constitutional:   No  weight loss, night sweats,  Fevers, chills, + fatigue, or  lassitude.  HEENT:   No headaches,  Difficulty swallowing,  Tooth/dental problems, or  Sore throat,                No sneezing, itching, ear ache, nasal congestion, post nasal drip,   CV:  No chest pain,  Orthopnea, PND, swelling in lower extremities, anasarca, dizziness, palpitations, syncope.   GI  No heartburn, indigestion, abdominal pain, nausea, vomiting, diarrhea, change in bowel habits, loss of appetite, bloody stools.   Resp: No shortness of breath with exertion or at rest.  No excess mucus, no productive cough,  No non-productive cough,  No coughing up of blood.  No change in color of mucus.  No wheezing.  No chest wall deformity  Skin: no rash or lesions.  GU: no dysuria, change in color of urine, no urgency  or frequency.  No flank pain, no hematuria   MS:  +chronic back hip pain.     Physical Exam  BP 138/70   Pulse 83   Temp 98.1 F (36.7 C) (Oral)   Ht 4\' 11"  (1.499 m)   Wt 146 lb 3.2 oz (66.3 kg)   LMP 12/21/1999   SpO2 96%   BMI 29.53 kg/m   GEN: A/Ox3; pleasant , NAD, elderly    HEENT:  [place]/AT,  EACs-clear, TMs-wnl, NOSE-clear, THROAT-clear, no lesions, no postnasal drip or exudate noted.   NECK:  Supple w/ fair ROM; no JVD; normal carotid impulses w/o bruits; no thyromegaly or nodules palpated; no lymphadenopathy.    RESP  Clear  P & A; w/o, wheezes/ rales/ or rhonchi. no accessory muscle use, no dullness to percussion  CARD:  RRR, no m/r/g, no peripheral edema, pulses intact, no cyanosis or clubbing.  GI:   Soft & nt; nml bowel sounds; no organomegaly or masses detected.   Musco: Warm bil, no deformities or joint swelling noted.   Neuro: alert, no focal deficits noted.    Skin: Warm, no lesions or rashes  Psych:  No change in mood or affect. No depression or anxiety.  No memory loss.  Lab Results:  CBC    Component Value Date/Time   WBC 5.9 10/04/2016 1029   RBC 4.48 10/04/2016 1029   HGB 13.7 10/04/2016 1029   HCT 39.9 10/04/2016 1029   PLT 329 10/04/2016 1029   MCV 89.1 10/04/2016 1029   MCH 30.6 10/04/2016 1029   MCHC 34.3 10/04/2016 1029   RDW 14.5 10/04/2016 1029   LYMPHSABS 1.2 06/10/2016 1300   MONOABS 0.6 06/10/2016 1300   EOSABS 0.1 06/10/2016 1300   BASOSABS 0.0 06/10/2016 1300    BMET    Component Value Date/Time   NA 140 10/04/2016 1029   K 3.9 10/04/2016 1029   CL 100 10/04/2016 1029   CO2 27 10/04/2016 1029   GLUCOSE 95 10/04/2016 1029   GLUCOSE 109 (H) 10/05/2006 1022   BUN 10 10/04/2016 1029   CREATININE 0.71 10/04/2016 1029   CALCIUM 9.4 10/04/2016 1029   GFRNONAA >60 01/11/2016 0638   GFRAA >60 01/11/2016 0638    BNP No results found for: BNP  ProBNP    Component Value Date/Time   PROBNP 23.0 06/10/2016 1300     Imaging: No results found.  Assessment & Plan:   No problem-specific Assessment & Plan notes found for this encounter.     Rexene Edison, NP 01/11/2017

## 2017-01-11 NOTE — Patient Instructions (Signed)
Follow med calendar closely and bring to each visit.  Continue on low fat cholesterol diet  Low salt diet .  Follow up with Dr. Louanne Skye for back issues soon.  Follow up Dr. Melvyn Novas  In 3-4 months and As needed

## 2017-01-11 NOTE — Progress Notes (Signed)
Chart and office note reviewed in detail  > agree with a/p as outlined    

## 2017-01-15 ENCOUNTER — Other Ambulatory Visit: Payer: Self-pay | Admitting: Internal Medicine

## 2017-01-25 NOTE — Addendum Note (Signed)
Addended by: Parke Poisson E on: 01/25/2017 10:18 AM   Modules accepted: Orders

## 2017-02-09 ENCOUNTER — Other Ambulatory Visit: Payer: Self-pay | Admitting: Internal Medicine

## 2017-03-19 ENCOUNTER — Other Ambulatory Visit: Payer: Self-pay | Admitting: Cardiovascular Disease

## 2017-03-21 ENCOUNTER — Other Ambulatory Visit: Payer: Self-pay | Admitting: Cardiovascular Disease

## 2017-03-21 NOTE — Telephone Encounter (Signed)
Rx request sent to pharmacy.  

## 2017-03-21 NOTE — Telephone Encounter (Signed)
Rx has been sent to the pharmacy electronically. ° °

## 2017-04-01 ENCOUNTER — Ambulatory Visit (INDEPENDENT_AMBULATORY_CARE_PROVIDER_SITE_OTHER): Payer: Medicare Other | Admitting: Cardiovascular Disease

## 2017-04-01 ENCOUNTER — Encounter: Payer: Self-pay | Admitting: Cardiovascular Disease

## 2017-04-01 VITALS — BP 110/72 | HR 74 | Ht 59.0 in | Wt 143.0 lb

## 2017-04-01 DIAGNOSIS — Z79899 Other long term (current) drug therapy: Secondary | ICD-10-CM

## 2017-04-01 DIAGNOSIS — I2510 Atherosclerotic heart disease of native coronary artery without angina pectoris: Secondary | ICD-10-CM | POA: Diagnosis not present

## 2017-04-01 DIAGNOSIS — E785 Hyperlipidemia, unspecified: Secondary | ICD-10-CM

## 2017-04-01 DIAGNOSIS — I10 Essential (primary) hypertension: Secondary | ICD-10-CM | POA: Diagnosis not present

## 2017-04-01 DIAGNOSIS — I2583 Coronary atherosclerosis due to lipid rich plaque: Secondary | ICD-10-CM

## 2017-04-01 MED ORDER — EZETIMIBE 10 MG PO TABS: 10.0000 mg | ORAL_TABLET | Freq: Every day | ORAL | 6 refills | Status: DC

## 2017-04-01 NOTE — Progress Notes (Signed)
Patient ID: Cassie Campbell, female   DOB: 1941/04/14, 76 y.o.   MRN: [id number]     HPI: Cassie Campbell is a 76 y.o. female who presents for 6 month follow-up cardiology evaluation.    Cassie Campbell has a history of complex CAD and underwent catheterization by Dr. Fletcher Anon with class IV symptoms on 10/16/2014.  She was found to have diffusely diseased LAD, which was not suitable for revascularization.  She had ruled in for non-ST segment elevation myocardial infarction, felt to be due to a severely calcified almost subtotal RCA with severe diffuse calcified disease.  Initial attempt at PCI by Dr. Fletcher Anon was unsuccessful due to the severe calcification.  She was brought back to the laboratory the following day, and I performed a very long attempt at high-speed rotational atherectomy.  Unfortunately, due to the severely stenosed calcified lesion above the acute margin the Roto floppy wire was never be advanced distal enough due to severe disease beyond this site to allow the burr to be inserted to reach the stenosis.  Multiple attempts were made to utilize wire transfer, but even the smallest catheter was never able to pass the stenosis to allow for the Roto floppy wire to be reinserted in exchange for a Fielder XT wire, which was able to cross the stenosis and advanced distally.  Consequently, the procedure was aborted.  She was sent home on increased medication regimen with potential plans for possible follow-up evaluation depending upon symptom status.  Since hospital discharge, she has not experienced any significant recurrent anginal chest pain.  During the hospitalization ranolazine was added initially at 500 mg twice a day to isosorbide mononitrate, amlodipine, and beta blocker therapy.  She has been treated with aspirin and Brilinta for dual antiplatelet therapy. When I saw her for subsequent evaluation I further titrated her ranolazine to 1000 mg twice a day and further titrated her Lopressor to 37.5 mg  twice a day.  Her creatinine remained normal following her contrast load and she was mildly anemic.    She has a history of asthma and is on Symbicort 160-4.5 and Singulair.  She  has a history of hyperlipidemia, currently on Lipitor 80 mg.  She does have GERD for which he takes Pepcid as well as omeprazole.  She developed some increased wheezing on Lopressor and was changed to bisoprolol 5 mg in place of metoprolol.  She has seen Dr. Melvyn Novas for her severe chronic asthma and in the past.  She also was found to have marked atopy with significant elevation of IgG E levels.  She feels that her breathing has improved with the changed to bisoprolol from Lopressor.   In March 2016 she had been remaining stable but  had experienced a 10-15 minute episode of chest pain during the evening which responded to nitroglycerin. When I saw her the following day she had been taking isosorbide 90 mg in the morning and I added isosorbide 30 mg at bedtime to her medical regimen of Ranexa 1000 mg twice a day , amlodipine 10 mg daily, and bisoprolol 5 mg. She denies recurrent chest pain symptomatology since the nocturnal dose of Imdur was added.  She developed a rash and  was advised to stop both Brilinta and Ranexa.  She was started on Plavix. Her rash ultimately resolved.     Her husband passed away in on January 11, 2016.  She has been more active.  She denies nocturnal symptoms.  She was evaluated in the emergency room in January 2017  with right shoulder discomfort.    Since I last saw her in October 2017 she denies any episodes of chest pain or shortness of breath.  She joined the [employer] and is exercising 2 days per week.  She continues to be on amlodipine 10 mg, isosorbide 90 mg in the morning and 30 mg in the evening, and Ranexa 500 mg twice a day.  She continues to be on double antiplatelet therapy.  She also is on bisoprolol HCT Z4.  Hypertension.  She has a history of asthma and is on Symbicort 160/4.5 , polio, and  Singulair..She continues to be on atorvastatin 80 mg for hyperlipidemia.  He tells me she may ultimately need low back surgery and is under evaluation by Dr. Louanne Skye. She presents  for follow-up evaluation.  Past Medical History:  Diagnosis Date  . Allergic rhinitis   . Asthma    xolair s 8/05 ?11/07; mastered hfa 12/20/08  . Benign positional vertigo   . CAD (coronary artery disease)    a. 09/2014 NSTEMI s/p LHC with sig 2V dz. dLAD diffusely diseased and not suitable for PCI. unsuccessful RCA PCI d/t heavy calcifications  . Depression   . Heart attack    09/2014  . Hyperlipidemia    <130 ldl pos fm hx, bp  . Hypertension   . Obesity   . Osteopenia    dexa 08/22/07 AP spine + 1.1, left femur -1.3, right femur -.8; dexa 10/06/09 +1.6, left femur =1.6, right femur -.  . PONV (postoperative nausea and vomiting)   . Ruptured disc, cervical     Past Surgical History:  Procedure Laterality Date  . BREAST SURGERY  10-26-10   Rt. breast bx--for microcalcifications in rt. retroareolar region--dx was hyalinized fibroadenoma  . BUNIONECTOMY Bilateral   . CERVICAL DISC SURGERY  12/01  . LEFT HEART CATHETERIZATION WITH CORONARY ANGIOGRAM N/A 10/16/2014   Procedure: LEFT HEART CATHETERIZATION WITH CORONARY ANGIOGRAM;  Surgeon: Blane Ohara, MD;  Location: [facility] CATH LAB;  Service: Cardiovascular;  Laterality: N/A;  . PERCUTANEOUS CORONARY ROTOBLATOR INTERVENTION (PCI-R) N/A 10/17/2014   Procedure: PERCUTANEOUS CORONARY ROTOBLATOR INTERVENTION (PCI-R);  Surgeon: Troy Sine, MD;  Location: [facility] CATH LAB;  Service: Cardiovascular;  Laterality: N/A;  . VEIN LIGATION AND STRIPPING    . VIDEO BRONCHOSCOPY Bilateral 02/05/2015   Procedure: VIDEO BRONCHOSCOPY WITHOUT FLUORO;  Surgeon: Tanda Rockers, MD;  Location: [facility] ENDOSCOPY;  Service: Endoscopy;  Laterality: Bilateral;  . VULVA /PERINEUM BIOPSY  12-30-10   --epidermoid cyst    Allergies  Allergen Reactions  . Codeine Phosphate Nausea Only  .  Aspirin Swelling and Rash  . Brilinta [Ticagrelor] Rash    Started on Ranexa & Brilinta 09/2014 - rash - unsure which it is directly related to  . Diphenhydramine Hcl Rash  . Fish-Derived Products Swelling and Rash  . Penicillins Swelling and Rash    Current Outpatient Prescriptions  Medication Sig Dispense Refill  . amLODipine (NORVASC) 10 MG tablet TAKE 1 TABLET BY MOUTH EVERY DAY 90 tablet 3  . atorvastatin (LIPITOR) 80 MG tablet TAKE 1 TABLET(80 MG) BY MOUTH DAILY 90 tablet 2  . bisoprolol-hydrochlorothiazide (ZIAC) 5-6.25 MG tablet Take 1 tablet by mouth daily. 30 tablet 11  . budesonide-formoterol (SYMBICORT) 160-4.5 MCG/ACT inhaler Inhale 1-2 puffs into the lungs 2 (two) times daily.    . cetirizine (ZYRTEC) 10 MG tablet Take 10 mg by mouth at bedtime as needed (itching and sneezing).     . Cholecalciferol (VITAMIN D3) 2000  units TABS Take 2,000 Units by mouth daily.    . clopidogrel (PLAVIX) 75 MG tablet TAKE 1 TABLET BY MOUTH EVERY DAY 90 tablet 3  . clotrimazole-betamethasone (LOTRISONE) cream Apply 1 application topically 2 (two) times daily as needed (itching).     Marland Kitchen dextromethorphan-guaiFENesin (MUCINEX DM) 30-600 MG per 12 hr tablet Take 1-2 tablets by mouth every 12 (twelve) hours as needed (cough).     . famotidine (PEPCID) 20 MG tablet Take 20 mg by mouth at bedtime.     . fluticasone (FLONASE) 50 MCG/ACT nasal spray Place 1-2 sprays into both nostrils daily as needed for allergies or rhinitis.    Marland Kitchen isosorbide mononitrate (IMDUR) 60 MG 24 hr tablet TAKE 1 AND 1/2 TABLETS BY MOUTH EVERY MORNING AND 1/2 TABLET EVERY EVENING 180 tablet 1  . meclizine (ANTIVERT) 25 MG tablet TAKE 2 TABLETS BY MOUTH THREE TIMES DAILY AS NEEDED 24 tablet 0  . montelukast (SINGULAIR) 10 MG tablet Take 1 tablet (10 mg total) by mouth at bedtime. 30 tablet 5  . Multiple Vitamin (MULTIVITAMIN) capsule Take 1 capsule by mouth every morning.     Marland Kitchen omeprazole (PRILOSEC) 20 MG capsule Take 20 mg by mouth  daily before breakfast.     . oxymetazoline (AFRIN) 0.05 % nasal spray 2 puffs every 12 hours for 5 days as needed for stuffy nose and sinus problems    . PROAIR HFA 108 (90 Base) MCG/ACT inhaler INHALE 1 TO 2 PUFFS BY MOUTH EVERY 4 HOURS AS NEEDED FOR WHEEZING 8.5 g 5  . RANEXA 500 MG 12 hr tablet TAKE 1 TABLET(500 MG) BY MOUTH TWICE DAILY 60 tablet 5  . sodium chloride (OCEAN) 0.65 % nasal spray Place 2 sprays into the nose every 4 (four) hours as needed (nasal congestion).     . SYMBICORT 160-4.5 MCG/ACT inhaler INHALE 2 PUFFS BY MOUTH EVERY 12 HOURS 10.2 g 5  . traMADol (ULTRAM) 50 MG tablet Take 1 tablet (50 mg total) by mouth every 4 (four) hours as needed (severe cough or pain). 40 tablet 0   No current facility-administered medications for this visit.     Social History   Social History  . Marital status: Married    Spouse name: N/A  . Number of children: N/A  . Years of education: N/A   Occupational History  . retired Tour manager    Social History Main Topics  . Smoking status: Never Smoker  . Smokeless tobacco: Never Used  . Alcohol use No  . Drug use: No  . Sexual activity: No   Other Topics Concern  . Not on file   Social History Narrative  . No narrative on file    Family History  Problem Relation Age of Onset  . Diabetes Mother     AODM  . Stroke Mother   . Hypertension Mother   . Heart disease Father   . Breast cancer Daughter 36    dec--mets to liver/spine  . Diabetes Sister     AODM  . Breast cancer Sister   . Hypertension Sister     ROS General: Negative; No fevers, chills, or night sweats HEENT: Negative; No changes in vision or hearing, sinus congestion, difficulty swallowing Pulmonary: positive for asthma; No cough, wheezing, shortness of breath, hemoptysis Cardiovascular: See HPI:  GI: Negative; No nausea, vomiting, diarrhea, or abdominal pain GU: Negative; No dysuria, hematuria, or difficulty voiding Musculoskeletal:  Occasional right shoulder discomfort, and low back discomfort Hematologic: Negative; no easy  bruising, bleeding Endocrine: Negative; no heat/cold intolerance; no diabetes, Neuro: Negative; no changes in balance, headaches Skin: Negative; No rashes or skin lesions Psychiatric: Negative; No behavioral problems, depression Sleep: Negative; No snoring,  daytime sleepiness, hypersomnolence, bruxism, restless legs, hypnogognic hallucinations. Other comprehensive 14 point system review is negative   Physical Exam BP 110/72   Pulse 74   Ht '4\' 11"'$  (1.499 m)   Wt 143 lb (64.9 kg)   LMP 12/21/1999   BMI 28.88 kg/m    Repeat blood pressure by me was 118/787  Wt Readings from Last 3 Encounters:  04/01/17 143 lb (64.9 kg)  01/11/17 146 lb 3.2 oz (66.3 kg)  10/27/16 146 lb (66.2 kg)   General: Alert, oriented, no distress.  Skin: normal turgor, no rashes, warm and dry HEENT: Normocephalic, atraumatic. Pupils equal round and reactive to light; sclera anicteric; extraocular muscles intact, No lid lag; Nose without nasal septal hypertrophy; Mouth/Parynx benign; Mallinpatti scale 3 Neck: No JVD, no carotid bruits; normal carotid upstroke Lungs: clear to ausculatation and percussion bilaterally; no wheezing or rales, normal inspiratory and expiratory effort Chest wall: without tenderness to palpitation Heart: PMI not displaced, RRR, s1 s2 normal, 1/6 systolic murmur, No diastolic murmur, no rubs, gallops, thrills, or heaves Abdomen: soft, nontender; no hepatosplenomehaly, BS+; abdominal aorta nontender and not dilated by palpation. Back: no CVA tenderness Pulses: 2+; Musculoskeletal: full range of motion, normal strength, no joint deformities Extremities: Pulses 2+, no clubbing cyanosis or edema, Homan's sign negative  Neurologic: grossly nonfocal; Cranial nerves grossly wnl Psychologic: Normal mood and affect  ECG (independently read by me): Normal sinus rhythm at 74 bpm.  Right bundle-branch  block with repolarization changes.  October 2017 ECG (independently read by me): Normal sinus rhythm at 68 bpm.  The right bundle branch block with repolarization changes.  April 2017 ECG (independently read by me): Normal sinus rhythm at 70 bpm.  Right bundle-branch block with repolarization changes.  November 2016 ECG (independently read by me): Normal sinus rhythm at 70 bpm.  Right bundle-branch block with repolarization changes.  May 2016 ECG (independently read by me):  Normal sinus rhythm at 65 bpm. Right bundle branch block with repolarization changes.  March 2016 ECG (independently read by me): Normal sinus rhythm at 68 bpm.  No ectopy.  No signal been ST segment changes.  December 2015 ECG (independently read by me): Normal sinus rhythm at 77 bpm.  Normal intervals.  No significant ST changes.  November 2015 ECG (independently read by me):sinus rhythm at 88 bpm.  QTc interval 454 ms.  No significant ST-T changes.  LABS:  BMP Latest Ref Rng & Units 10/04/2016 06/10/2016 03/25/2016  Glucose 65 - 99 mg/dL 95 103(H) 88  BUN 7 - 25 mg/dL '10 9 7  '$ Creatinine 0.60 - 0.93 mg/dL 0.71 0.75 0.76  Sodium 135 - 146 mmol/L 140 141 140  Potassium 3.5 - 5.3 mmol/L 3.9 4.0 4.3  Chloride 98 - 110 mmol/L 100 102 102  CO2 20 - 31 mmol/L 27 33(H) 28  Calcium 8.6 - 10.4 mg/dL 9.4 10.0 9.7    Hepatic Function Latest Ref Rng & Units 10/04/2016 03/25/2016 10/30/2015  Total Protein 6.1 - 8.1 g/dL 7.0 7.7 7.5  Albumin 3.6 - 5.1 g/dL 3.9 4.3 4.2  AST 10 - 35 U/L 19 21 32  ALT 6 - 29 U/L 20 20 34(H)  Alk Phosphatase 33 - 130 U/L 69 77 86  Total Bilirubin 0.2 - 1.2 mg/dL 0.8 0.8 0.8  Bilirubin,  Direct 0.0 - 0.3 mg/dL - - -    CBC Latest Ref Rng & Units 10/04/2016 06/10/2016 01/11/2016  WBC 3.8 - 10.8 K/uL 5.9 7.9 7.6  Hemoglobin 11.7 - 15.5 g/dL 13.7 13.5 12.8  Hematocrit 35.0 - 45.0 % 39.9 40.0 37.6  Platelets 140 - 400 K/uL 329 440.0(H) 442(H)   Lab Results  Component Value Date   MCV 89.1  10/04/2016   MCV 90.1 06/10/2016   MCV 89.1 01/11/2016   Lab Results  Component Value Date   TSH 1.49 06/10/2016  No results found for: HGBA1C  Lipid Panel     Component Value Date/Time   CHOL 167 10/04/2016 1029   TRIG 117 10/04/2016 1029   TRIG 85 10/05/2006 1022   HDL 62 10/04/2016 1029   CHOLHDL 2.7 10/04/2016 1029   VLDL 23 10/04/2016 1029   LDLCALC 82 10/04/2016 1029   RADIOLOGY: No results found.  IMPRESSION:  1. Coronary artery disease due to lipid rich plaque   2. Essential hypertension   3. Hyperlipidemia with target LDL less than 70   4. Medication management     ASSESSMENT AND PLAN: Cassie Campbell is a 76 year old African-American female who suffered a NSTEMI  in October 2015 which was felt to be due to subtotal high-grade calcified RCA with severe diffuse distal disease beyond her subtotal stenosis.  She also has a severely diffusely diseased mid distal LAD which is not amenable for revascularization.  At the time of her intervention, the smallest balloon catheter that was available in the catheterization laboratory was a 1.20 and this was unable to cross the stenosis.  Since that time, we have been titrating her medical regimen.  Fortunately, she has responded well to aggressive medical regimen consisting of amlodipine 10 mg, bisoprolol/HCT 5/6.25 mg, isosorbide mononitrate 90 mg in the morning and 30 mg at night in addition to ranexa 500 mg twice a day.  Her ECG is unchanged.  She does have asthma and this has been stable on Symbicort in addition to Singulair.  There is no wheezing on exam.  She is being aggressively treated with atorvastatin 80 mg for hyperlipidemia  LDL had risen to 103.  Discussed with her recent data, particularly with use of the PCS canine inhibitors, which dramatically lower LDL cholesterol.  I discussed with her data that significant lowering of her LDL is associated with reduction in CAD progression and regression  and Zetia 10 mg to her  regimen with plans to recheck laboratory in 2 months.  If her LDL remains elevated, she may be a candidate for Repatha PCSK9 inhibition. She is on Plavix 75 mg and is tolerating this well without rash.  If she will need to undergo low back surgery, we would most likely recommend she undergo a [place] study for preoperative assessment.  As long as she remains stable, I will see her in 6 months for cardiology reevaluation.  Time spent: 25 minutes  Troy Sine, MD, [facility]  04/01/2017 8:32 AM

## 2017-04-01 NOTE — Patient Instructions (Signed)
Your physician recommends that you return for lab work in: 2 months.  Your physician has recommended you make the following change in your medication:   1.) start new ezetimibe 10 mg daily. A new prescription has been sent to your pharmacy.  Your physician wants you to follow-up in: 6 months or sooner if needed. You will receive a reminder letter in the mail two months in advance. If you don't receive a letter, please call our office to schedule the follow-up appointment.  If you need a refill on your cardiac medications before your next appointment, please call your pharmacy.
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
## 2017-04-08 ENCOUNTER — Other Ambulatory Visit: Payer: Self-pay | Admitting: Internal Medicine

## 2017-04-08 ENCOUNTER — Ambulatory Visit (INDEPENDENT_AMBULATORY_CARE_PROVIDER_SITE_OTHER): Payer: Medicare Other | Admitting: Specialist

## 2017-04-09 ENCOUNTER — Other Ambulatory Visit: Payer: Self-pay | Admitting: Cardiovascular Disease

## 2017-04-11 ENCOUNTER — Ambulatory Visit (INDEPENDENT_AMBULATORY_CARE_PROVIDER_SITE_OTHER): Payer: Medicare Other | Admitting: Internal Medicine

## 2017-04-11 ENCOUNTER — Encounter: Payer: Self-pay | Admitting: Internal Medicine

## 2017-04-11 VITALS — BP 148/84 | HR 67 | Ht 59.0 in | Wt 140.6 lb

## 2017-04-11 DIAGNOSIS — M109 Gout, unspecified: Secondary | ICD-10-CM | POA: Insufficient documentation

## 2017-04-11 DIAGNOSIS — J4540 Moderate persistent asthma, uncomplicated: Secondary | ICD-10-CM | POA: Diagnosis not present

## 2017-04-11 DIAGNOSIS — I10 Essential (primary) hypertension: Secondary | ICD-10-CM

## 2017-04-11 DIAGNOSIS — M10072 Idiopathic gout, left ankle and foot: Secondary | ICD-10-CM | POA: Diagnosis not present

## 2017-04-11 MED ORDER — BUDESONIDE-FORMOTEROL FUMARATE 160-4.5 MCG/ACT IN AERO: 2.0000 | INHALATION_SPRAY | Freq: Two times a day (BID) | RESPIRATORY_TRACT | 0 refills | Status: DC

## 2017-04-11 MED ORDER — FUROSEMIDE 20 MG PO TABS
20.0000 mg | ORAL_TABLET | Freq: Every day | ORAL | 11 refills | Status: DC
Start: 2017-04-11 — End: 2018-02-19

## 2017-04-11 MED ORDER — PREDNISONE 10 MG PO TABS: ORAL_TABLET | ORAL | 0 refills | Status: DC

## 2017-04-11 MED ORDER — BISOPROLOL FUMARATE 5 MG PO TABS: 5.0000 mg | ORAL_TABLET | Freq: Every day | ORAL | 11 refills | Status: DC

## 2017-04-11 NOTE — Assessment & Plan Note (Signed)
-  Xolair rx  07/2004 > 10/2006  - IgE  296 11/20/2013  - Prevnar rx 05/07/14  - Spirometry 06/10/2016  FEV1 1.06 (80%)  Ratio 57 - NO   06/10/2016  = 15   - 06/10/2016  After extensive coaching HFA effectiveness =    75%  - IgE  06/10/16  293  - FENO 07/08/2016  =   9   - 07/08/2016 instructed on dpi > changed to arnuity 100 qam due to insurance > try off 10/06/2016   Mild flare in setting of allergic rhinitis flare > rx with max symb 160 = 2bid / flonase/afrin x 5 day and Prednisone 10 mg take  4 each am x 2 days,   2 each am x 2 days,  1 each am x 2 days and stop    I had an extended discussion with the patient reviewing all relevant studies completed to date and  lasting 15 to 20 minutes of a 25 minute visit    Each maintenance medication was reviewed in detail including most importantly the difference between maintenance and prns and under what circumstances the prns are to be triggered using an action plan format that is not reflected in the computer generated alphabetically organized AVS but trather by a customized med calendar that reflects the AVS meds with confirmed 100% correlation.   In addition, Please see AVS for unique instructions that I personally wrote and verbalized to the the pt in detail and then reviewed with pt  by my nurse highlighting any  changes in therapy recommended at today's visit to their plan of care.

## 2017-04-11 NOTE — Progress Notes (Signed)
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 Follow in pulmonary clinic also for primary care with hbp/ hyperlipidemia complicated by MI 78/29/56 and persistent mucus plug in RML/ RLL s/p fob 02/05/15 with marked improvement p lavage and no endobrchial lesions, just diffuse airway swelling      History of Present Illness   10/14/13 [name] > laser rx for back pain  rad legs > resolved    Admit Date: 10/15/2014 Discharge date: 10/19/2014    PRIMARY DISCHARGE DIAGNOSIS:  NSTEMI (non-ST elevated myocardial infarction)   Essential hypertension  GERD (gastroesophageal reflux disease)  Chest pain  Hyperlipidemia  Hypertension  CAD (coronary artery disease)  11/28/2014 acute ov/Anthonee Gelin re: asthma flare despite qvar and symbicort max doses  Chief Complaint  Patient presents with  . Acute Visit    Pt c/o increased wheezing over the past month- esp worse at night.    Wheezing worse at 3 am, sits up and better p saba  > back to sleep,  Onset was indolent and increased as lopressor dose was increased  >>d/c lopressor , rx bisoprolol , pred taper      07/08/2016  f/u ov/Sofia Jaquith re: asthma/ hbp Chief Complaint  Patient presents with  . Follow-up    Breathing is unchanged.  No new co's today.    some worse in heat p ran out of qvar 5 d prior to OV  But no noct symptoms or need for noct saba rec Prednisone 10 mg take  4 each am x 2 days,   2 each am x 2 days,  1 each am x 2 days and stop  Try arnuity 100 one each am in place of qvar but if not better need to check your insurance formulary for alternatives    10/06/2016  f/u ov/Chrishonda Hesch re: asthma/ hbp on symbicort 160 2bid/ arnuity  Chief Complaint  Patient presents with  . Follow-up    Breathing is  overall doing well and she has not had to use rescue inhaler.    Try off arnuity to see if any effects on your breathing, coughing or need for albuterol     04/11/2017  f/u ov/Grabiel Schmutz re: asthma/ hbp/ leg swelling with pain R foot  Chief Complaint  Patient presents with  . Follow-up    Increased cough, wheezing anfd runny nose for the past month.    no change off arnuity/ worse though with nasal symptoms x one month  Wheezing better when using the symb 160 2bid  New left foot pain worse in am on hctz / swelling in both feet on norvasc   No obvious day to day or daytime variability or assoc excess/ purulent sputum or mucus plugs or hemoptysis or cp or chest tightness, subjective wheeze or overt sinus or hb symptoms. No unusual exp hx or h/o childhood pna/ asthma or knowledge of premature birth.  Sleeping ok without nocturnal  or early am exacerbation  of respiratory  c/o's or need for noct saba. Also denies any obvious fluctuation of symptoms with weather or environmental changes or other aggravating or alleviating factors except as outlined above   Current Medications, Allergies, Complete Past Medical History, Past Surgical History, Family History, and Social History were reviewed in [place]  [facility] electronic medical record.  ROS  The following are not active complaints unless bolded sore throat, dysphagia, dental problems, itching, sneezing,  nasal congestion or excess/ purulent secretions, ear ache,   fever, chills, sweats, unintended wt loss, classically pleuritic or exertional cp,  orthopnea pnd or leg swelling, presyncope, palpitations, abdominal pain, anorexia, nausea, vomiting, diarrhea  or change in bowel or bladder habits, change in stools or urine, dysuria,hematuria,  rash, arthralgias, visual complaints, headache, numbness, weakness or ataxia or problems with walking or coordination,  change in mood/affect or memory.                  Past Medical History:  BENIGN POSITIONAL  VERTIGO (ICD-386.11)  OSTEOPENIA (ICD-733.90)  - DEXA 08/22/07 AP spine + 1.1, left femur -1.3, right femur -.8  - DEXA 10/06/09 + 1.6 left femur -1.6, right femur -.5   -DEXA 2014 no change  HYPERTENSION (ICD-401.9)  HYPERLIPIDEMIA (ICD-272.4)  - Target < 130 ldl pos fm hx, hbp  OBESITY  - Target wt = 178 for BMI < 30  ASTHMA (ICD-493.90) ([name] not active)  -Xolair s 8/05 > 10/2006  -Mastered HFA December 30, 2008  ALLERGIC RHINITIS (ICD-477.9)  S/p cervical fusion 2000 ............................................................................ [place]...........................................................................Marland KitchenWert  - Td 05/20/2015  - Pneumovax 09/2003 and October 27, 2009  Prevnar 05/07/2014  - Colonscopy 10/28/2004  - CPX 11/19/13     - GYN per [name]'s office -Mammogram 2013 , 2014  MED CALENDAR 05/05/11 , 05/16/2013 , 08/06/2014 , 01/28/2015 , 08/26/2015        Objective:   Physical Exam  Wt 143 11/09/2011 >  04/12/2012  140 > 06/26/2012  139 >  11/13/2012 135 >  137 02/12/2013 >141 05/16/2013 > 07/31/2013  139 >  11/19/2013  134 >135 02/18/2014 > 04/16/2014 134 > 05/07/2014  134 >131 08/06/2014 > 10/30/2014 130 > 11/28/2014 128 > 02/18/2015   133 > 05/20/2015 133 > 07/14/2015   135 >137 08/26/2015 > 11/25/2015 143 > 12/29/2015  137 > 02/23/2016   131 > 06/10/2016   148 > 07/08/2016   148 > 10/06/2016 146 > 04/11/2017  140   Pleasant amb bf nad/ vital signs reviewed -  .- Note on arrival 02 sats  95% on RA    HEENT: top dentures, nl turbinates, and orophanx. Nl external ear canals without cough reflex   NECK :  without JVD/Nodes/TM/ nl carotid upstrokes bilaterally   LUNGS: no acc muscle use,  slt distant bs with late exp wheeze bilaterally    CV:  RRR  no s3 or murmur or increase in P2, Bilateral trace lower ext pitting edema / warm L dorsum of foot distally   ABD:  soft and nontender with nl excursion in the supine position. No bruits or organomegaly, bowel sounds  nl  MS:  warm without deformities, calf tenderness, cyanosis or clubbing  SKIN: clear w/o rash   NEURO:  alert, approp, no deficits on motor/cerbellar testing, nl reflexes/ no nystagmus    .            Assessment & Plan:

## 2017-04-11 NOTE — Assessment & Plan Note (Signed)
Clinical dx/ developed 04/11/2017 on hctz > rec d/c hctz and Prednisone 10 mg take  4 each am x 2 days,   2 each am x 2 days,  1 each am x 2 days and stop   Rheum f/u prn flare off hctz  see avs for instructions unique to this ov

## 2017-04-11 NOTE — Patient Instructions (Signed)
Stop bisoprolol-hct and instead take bisoprolol 5 mg daily   Start lasix (furosemide) 20 mg daily  Prednisone 10 mg take  4 each am x 2 days,   2 each am x 2 days,  1 each am x 2 days and stop   See Tammy NP w/in 2 weeks or first available thereafter with all your medications, even over the counter meds, separated in two separate bags, the ones you take no matter what vs the ones you stop once you feel better and take only as needed when you feel you need them.   Tammy  will generate for you a new user friendly medication calendar that will put [place] all on the same page re: your medication use.     Without this process, it simply isn't possible to assure that we are providing  your outpatient care  with  the attention to detail we feel you deserve.   If we cannot assure that you're getting that kind of care,  then we cannot manage your problem effectively from this clinic.  Once you have seen Tammy and we are sure that we're all on the same page with your medication use she will arrange follow up with me.

## 2017-04-11 NOTE — Assessment & Plan Note (Signed)
-  Changed lopressor to bisoprolol 11/29/14 due to wheezing  - changed to bisoprolol /hct due to tendency to leg swelling > d/c 04/11/2017 with ?  gout L foot   Will change to bisoprolol 5 mg daily/ try lasix 20 mg daily and f/u in 2 weeks

## 2017-04-11 NOTE — Addendum Note (Signed)
Addended by: Chase Picket A on: 04/11/2017 09:26 AM   Modules accepted: Orders

## 2017-04-13 ENCOUNTER — Encounter: Payer: Self-pay | Admitting: Obstetrics and Gynecology

## 2017-04-13 ENCOUNTER — Ambulatory Visit (INDEPENDENT_AMBULATORY_CARE_PROVIDER_SITE_OTHER): Payer: Medicare Other | Admitting: Obstetrics and Gynecology

## 2017-04-13 VITALS — BP 118/62 | HR 76 | Resp 14 | Ht 59.0 in | Wt 140.0 lb

## 2017-04-13 DIAGNOSIS — Z01419 Encounter for gynecological examination (general) (routine) without abnormal findings: Secondary | ICD-10-CM

## 2017-04-14 ENCOUNTER — Encounter (INDEPENDENT_AMBULATORY_CARE_PROVIDER_SITE_OTHER): Payer: Self-pay | Admitting: Specialist

## 2017-04-14 ENCOUNTER — Ambulatory Visit (INDEPENDENT_AMBULATORY_CARE_PROVIDER_SITE_OTHER): Payer: Medicare Other | Admitting: Specialist

## 2017-04-14 ENCOUNTER — Telehealth: Payer: Self-pay | Admitting: Cardiovascular Disease

## 2017-04-14 VITALS — BP 124/70 | HR 64 | Ht 59.0 in | Wt 140.0 lb

## 2017-04-14 DIAGNOSIS — M48062 Spinal stenosis, lumbar region with neurogenic claudication: Secondary | ICD-10-CM | POA: Diagnosis not present

## 2017-04-14 DIAGNOSIS — M4316 Spondylolisthesis, lumbar region: Secondary | ICD-10-CM

## 2017-04-14 NOTE — Patient Instructions (Signed)
Avoid bending, stooping and avoid lifting weights greater than 10 lbs. Avoid prolong standing and walking. Order for a new walker with wheels. Surgery scheduling secretary Kandice Hams, will call you in the next week to schedule for surgery.  Surgery recommended is a one level lumbar fusion L4-5 and foramenotomy left L5 , fusion would be done with rods, screws and cage with local bone graft and allograft (donor bone graft). Take hydrocodone for for pain. Risk of surgery includes risk of infection 1 in 300 patients, bleeding 1/2% chance you would need a transfusion.   Risk to the nerves is one in 10,000. You will need to use a brace for 3 months and wean from the brace on the 4th month. Expect improved walking and standing tolerance. Expect relief of leg pain but numbness may persist depending on the length and degree of pressure that has been present.

## 2017-04-14 NOTE — Telephone Encounter (Signed)
Left detailed message ok to take medications anytime that is convenient for her to take-per [facility] am-pm, etc

## 2017-04-14 NOTE — Telephone Encounter (Signed)
New message   Pt C/O medication issue:  1. Name of Medication: ezetimibe (ZETIA) 10 MG tablet  2. How are you currently taking this medication (dosage and times per day)? Daily   3. Are you having a reaction (difficulty breathing--STAT)? No   4. What is your medication issue? best time to take in am or pm - due to Lipitor taken in pm .

## 2017-04-14 NOTE — Progress Notes (Addendum)
Office Visit Note   Patient: Cassie Campbell           Date of Birth: 1941-07-29           MRN: [id number] Visit Date: 04/14/2017              Requested by: Tanda Rockers, MD [address], [place] [address] PCP: Christinia Gully, MD   Assessment & Plan: Visit Diagnoses:  1. Spinal stenosis of lumbar region with neurogenic claudication   2. Spondylolisthesis, lumbar region     Plan:Avoid bending, stooping and avoid lifting weights greater than 10 lbs. Avoid prolong standing and walking. Order for a new walker with wheels. Surgery scheduling secretary Kandice Hams, will call you in the next week to schedule for surgery.  Surgery recommended is a one level lumbar fusion L4-5 and foramenotomy left L5 , fusion would be done with rods, screws and cage with local bone graft and allograft (donor bone graft). Take hydrocodone for for pain. Risk of surgery includes risk of infection 1 in 300 patients, bleeding 1/2% chance you would need a transfusion.   Risk to the nerves is one in 10,000. You will need to use a brace for 3 months and wean from the brace on the 4th month. Expect improved walking and standing tolerance. Expect relief of leg pain but numbness may persist depending on the length and degree of pressure that has been present.   Follow-Up Instructions: Return in about 4 weeks (around 05/12/2017).   Orders:  No orders of the defined types were placed in this encounter.  No orders of the defined types were placed in this encounter.     Procedures: No procedures performed   Clinical Data: No additional findings.   Subjective: No chief complaint on file.   76 year old female with back pain greater than left leg pain. Worsening with sitting and bending, also pain in certain positions when in bed, pain when she tries to get upright and  Now with difficulty standing in place for more than 10 minutes and then has to find a chair to sit, better post sitting but  if she sits too long she has pain with transitioning to standing  And with getting out of the car. She in bending over more, and when the pain is bad she notices that she is bending even more. Bowel and bladder is normal. Notices numbness and TIngling along the left lateral calf, sometimes it feels numb like it is cold and then the tingling. Pain with coughing and sneezing and in the left lateral thigh, she sometimes hold the left thigh with coughing till she is finished. She has tried PT and the [facility] and still is experiencing night pain and short distance walking limited due to pain. Leaning on grocery cart, doesn't use the electric cart.     Review of Systems  HENT: Negative.   Eyes: Negative.   Respiratory: Negative.   Cardiovascular: Negative.   Gastrointestinal: Negative.   Endocrine: Negative.   Genitourinary: Negative.   Musculoskeletal: Positive for back pain and gait problem.  Skin: Negative.   Allergic/Immunologic: Negative.   Neurological: Positive for weakness and numbness.  Hematological: Negative.   Psychiatric/Behavioral: Negative.      Objective: Vital Signs: BP 124/70 (BP Location: Left Arm, Patient Position: Sitting, Cuff Size: Normal)   Pulse 64   Ht 4\' 11"  (1.499 m)   Wt 140 lb (63.5 kg)   LMP 12/21/1999   BMI 28.28  kg/m   Physical Exam  Constitutional: She is oriented to person, place, and time. She appears well-developed and well-nourished.  HENT:  Head: Normocephalic and atraumatic.  Eyes: EOM are normal. Pupils are equal, round, and reactive to light.  Neck: Normal range of motion. Neck supple.  Pulmonary/Chest: Effort normal and breath sounds normal.  Abdominal: Soft. Bowel sounds are normal.  Neurological: She is alert and oriented to person, place, and time.  Skin: Skin is warm and dry.  Psychiatric: She has a normal mood and affect. Her behavior is normal. Judgment and thought content normal.    Back Exam   Tenderness  The patient is  experiencing tenderness in the lumbar.  Range of Motion  Extension: abnormal  Flexion: normal  Lateral Bend Right: abnormal  Lateral Bend Left: abnormal  Rotation Right: abnormal  Rotation Left: abnormal   Muscle Strength  Right Quadriceps:  5/5  Left Quadriceps:  5/5  Right Hamstrings:  5/5  Left Hamstrings:  5/5   Tests  Straight leg raise right: negative Straight leg raise left: negative  Reflexes  Patellar: normal Achilles: normal Biceps: normal Babinski's sign: normal   Other  Toe Walk: normal Heel Walk: normal Sensation: normal Gait: abnormal  Erythema: no back redness Scars: absent  Comments:  Left leg foot dorsiflexion weakness 4/5 and left EHL 4/5.      Specialty Comments:  No specialty comments available.  Imaging: No results found.   PMFS History: Patient Active Problem List   Diagnosis Date Noted  . Gout of foot 04/11/2017  . Spinal stenosis of lumbar region 01/11/2017  . Dyspnea 06/10/2016  . Muscle strain of scapular region 01/22/2016  . CAD in native artery 10/28/2015  . Tinea cruris 07/20/2015  . Right bundle branch block 04/28/2015  . Pain in the chest   . Chest pain, atypical 11/06/2014  . HCAP (healthcare-associated pneumonia) 11/05/2014  . CAD (coronary artery disease)   . Chest pain 10/15/2014  . NSTEMI (non-ST elevated myocardial infarction) ([place]) 10/15/2014  . Health care maintenance 10/22/2013  . Arthralgia of hip 02/12/2013  . GERD (gastroesophageal reflux disease) 03/10/2012  . Intertriginous candidiasis 11/09/2011  . Atelectasis 02/24/2009  . BENIGN POSITIONAL VERTIGO 11/10/2007  . Hyperlipidemia with target LDL less than 70 10/05/2007  . Essential hypertension 10/05/2007  . ALLERGIC RHINITIS 10/05/2007  . Moderate persistent asthma in adult without complication 94/76/5465  . Disorder of bone and cartilage 10/05/2007   Past Medical History:  Diagnosis Date  . Allergic rhinitis   . Asthma    xolair s 8/05 ?11/07;  mastered hfa 12/20/08  . Benign positional vertigo   . CAD (coronary artery disease)    a. 09/2014 NSTEMI s/p LHC with sig 2V dz. dLAD diffusely diseased and not suitable for PCI. unsuccessful RCA PCI d/t heavy calcifications  . Depression   . Heart attack ([place])    09/2014  . Hyperlipidemia    <130 ldl pos fm hx, bp  . Hypertension   . Obesity   . Osteopenia    dexa 08/22/07 AP spine + 1.1, left femur -1.3, right femur -.8; dexa 10/06/09 +1.6, left femur =1.6, right femur -.  . PONV (postoperative nausea and vomiting)   . Ruptured disc, cervical     Family History  Problem Relation Age of Onset  . Diabetes Mother     AODM  . Stroke Mother   . Hypertension Mother   . Heart disease Father   . Diabetes Sister  AODM  . Breast cancer Sister   . Hypertension Sister   . Breast cancer Daughter 52    dec--mets to liver/spine    Past Surgical History:  Procedure Laterality Date  . BREAST SURGERY  10-26-10   Rt. breast bx--for microcalcifications in rt. retroareolar region--dx was hyalinized fibroadenoma  . BUNIONECTOMY Bilateral   . CERVICAL DISC SURGERY  12/01  . LEFT HEART CATHETERIZATION WITH CORONARY ANGIOGRAM N/A 10/16/2014   Procedure: LEFT HEART CATHETERIZATION WITH CORONARY ANGIOGRAM;  Surgeon: Blane Ohara, MD;  Location: [facility] CATH LAB;  Service: Cardiovascular;  Laterality: N/A;  . PERCUTANEOUS CORONARY ROTOBLATOR INTERVENTION (PCI-R) N/A 10/17/2014   Procedure: PERCUTANEOUS CORONARY ROTOBLATOR INTERVENTION (PCI-R);  Surgeon: Troy Sine, MD;  Location: [facility] CATH LAB;  Service: Cardiovascular;  Laterality: N/A;  . VEIN LIGATION AND STRIPPING    . VIDEO BRONCHOSCOPY Bilateral 02/05/2015   Procedure: VIDEO BRONCHOSCOPY WITHOUT FLUORO;  Surgeon: Tanda Rockers, MD;  Location: [facility] ENDOSCOPY;  Service: Endoscopy;  Laterality: Bilateral;  . VULVA /PERINEUM BIOPSY  12-30-10   --epidermoid cyst   Social History   Occupational History  . retired Tour manager     Social History Main Topics  . Smoking status: Never Smoker  . Smokeless tobacco: Never Used  . Alcohol use No  . Drug use: No  . Sexual activity: No

## 2017-04-25 ENCOUNTER — Encounter: Payer: Medicare Other | Admitting: Adult Health

## 2017-05-13 ENCOUNTER — Telehealth (INDEPENDENT_AMBULATORY_CARE_PROVIDER_SITE_OTHER): Payer: Self-pay | Admitting: Specialist

## 2017-05-13 NOTE — Telephone Encounter (Signed)
Patient called needing Rx refilled (Hydrocodone) The number to contact patient is [phone]

## 2017-05-13 NOTE — Telephone Encounter (Signed)
Patient called needing Rx refilled (Hydrocodone) --I did not see this on her med list

## 2017-05-17 ENCOUNTER — Telehealth (INDEPENDENT_AMBULATORY_CARE_PROVIDER_SITE_OTHER): Payer: Self-pay

## 2017-05-17 NOTE — Telephone Encounter (Signed)
Hydrocodone refill request.  

## 2017-05-17 NOTE — Telephone Encounter (Signed)
Patient called concerning a Rx refill for Hydrocodone. CB# is [phone]. Thank You

## 2017-05-19 ENCOUNTER — Other Ambulatory Visit (INDEPENDENT_AMBULATORY_CARE_PROVIDER_SITE_OTHER): Payer: Self-pay | Admitting: Specialist

## 2017-05-19 MED ORDER — TRAMADOL HCL 50 MG PO TABS: 50.0000 mg | ORAL_TABLET | ORAL | 0 refills | Status: DC | PRN

## 2017-05-20 ENCOUNTER — Other Ambulatory Visit (INDEPENDENT_AMBULATORY_CARE_PROVIDER_SITE_OTHER): Payer: Self-pay | Admitting: Specialist

## 2017-05-20 MED ORDER — TRAMADOL HCL 50 MG PO TABS: 50.0000 mg | ORAL_TABLET | ORAL | 0 refills | Status: DC | PRN

## 2017-05-20 NOTE — Telephone Encounter (Signed)
Rx for tramadol, more appropriate long term medication for pain.

## 2017-05-20 NOTE — Telephone Encounter (Signed)
I called rx to [facility], I called and lmom for pt that this was done

## 2017-05-23 ENCOUNTER — Other Ambulatory Visit (INDEPENDENT_AMBULATORY_CARE_PROVIDER_SITE_OTHER): Payer: Self-pay | Admitting: Specialist

## 2017-05-23 ENCOUNTER — Telehealth (INDEPENDENT_AMBULATORY_CARE_PROVIDER_SITE_OTHER): Payer: Self-pay | Admitting: Specialist

## 2017-05-23 MED ORDER — HYDROCODONE-ACETAMINOPHEN 5-325 MG PO TABS: 1.0000 | ORAL_TABLET | Freq: Four times a day (QID) | ORAL | 0 refills | Status: DC | PRN

## 2017-05-23 NOTE — Telephone Encounter (Signed)
I called and advised patient that her rx was ready for pick up at the front desk 

## 2017-05-23 NOTE — Telephone Encounter (Signed)
Patient called advised Dr Louanne Skye prescribed Tramadol for her but she advised she can not take it. Patient advised the medicine makes her sick. Patient asked for a refill on (Hydrocodone). Patient advised she did not pick up the Rx for Tramadol

## 2017-05-23 NOTE — Telephone Encounter (Signed)
Patient called advised Dr Louanne Skye prescribed Tramadol for her but she advised she can not take it. Patient advised the medicine makes her sick. Patient asked for a refill on (Hydrocodone). Patient advised she did not pick up the Rx for Tramadol. The number to contact patient is [phone]

## 2017-05-23 NOTE — Telephone Encounter (Signed)
Rx for #50 hydrocodone tablets prescribed, printed and wll sign.

## 2017-05-23 NOTE — Progress Notes (Signed)
Patient is aware her rx is ready for pick up 

## 2017-05-24 DIAGNOSIS — I2583 Coronary atherosclerosis due to lipid rich plaque: Secondary | ICD-10-CM | POA: Diagnosis not present

## 2017-05-24 DIAGNOSIS — I2510 Atherosclerotic heart disease of native coronary artery without angina pectoris: Secondary | ICD-10-CM | POA: Diagnosis not present

## 2017-05-24 DIAGNOSIS — I10 Essential (primary) hypertension: Secondary | ICD-10-CM | POA: Diagnosis not present

## 2017-05-24 DIAGNOSIS — E785 Hyperlipidemia, unspecified: Secondary | ICD-10-CM | POA: Diagnosis not present

## 2017-05-24 DIAGNOSIS — Z79899 Other long term (current) drug therapy: Secondary | ICD-10-CM | POA: Diagnosis not present

## 2017-06-12 ENCOUNTER — Other Ambulatory Visit: Payer: Self-pay | Admitting: Internal Medicine

## 2017-06-21 ENCOUNTER — Encounter: Payer: Medicare Other | Admitting: Adult Health

## 2017-06-24 ENCOUNTER — Telehealth (INDEPENDENT_AMBULATORY_CARE_PROVIDER_SITE_OTHER): Payer: Self-pay

## 2017-06-24 ENCOUNTER — Encounter (INDEPENDENT_AMBULATORY_CARE_PROVIDER_SITE_OTHER): Payer: Self-pay | Admitting: Specialist

## 2017-06-24 ENCOUNTER — Other Ambulatory Visit (INDEPENDENT_AMBULATORY_CARE_PROVIDER_SITE_OTHER): Payer: Self-pay | Admitting: Specialist

## 2017-06-24 DIAGNOSIS — M4316 Spondylolisthesis, lumbar region: Secondary | ICD-10-CM | POA: Insufficient documentation

## 2017-06-24 NOTE — Telephone Encounter (Signed)
Patient left voice mail inquiring about scheduling surgery.  I do not have a surgery order.  Please complete surgery sheet.

## 2017-06-24 NOTE — Telephone Encounter (Signed)
Sheet completed, and I will leave on your door. jen

## 2017-06-27 NOTE — Telephone Encounter (Signed)
Sherrie has surgery sheet

## 2017-07-07 NOTE — Telephone Encounter (Signed)
Patient has been scheduled for surgery

## 2017-07-12 ENCOUNTER — Telehealth: Payer: Self-pay | Admitting: Cardiovascular Disease

## 2017-07-12 NOTE — Telephone Encounter (Signed)
New Message  Patient calling the office for samples of medication:   1.  What medication and dosage are you requesting samples for? Ranexa 500mg    2.  Are you currently out of this medication? Yes

## 2017-07-13 NOTE — Telephone Encounter (Signed)
Spoke with patient and advised that we do not get samples of Ranexa, stated she had refills at pharmacy.

## 2017-07-20 HISTORY — PX: OTHER SURGICAL HISTORY: SHX169

## 2017-07-25 ENCOUNTER — Other Ambulatory Visit: Payer: Self-pay | Admitting: Internal Medicine

## 2017-07-26 ENCOUNTER — Ambulatory Visit (INDEPENDENT_AMBULATORY_CARE_PROVIDER_SITE_OTHER)
Admission: RE | Admit: 2017-07-26 | Discharge: 2017-07-26 | Disposition: A | Discharge status: Home / Self Care | Payer: Medicare HMO | Source: Ambulatory Visit | Attending: Adult Health | Admitting: Adult Health

## 2017-07-26 ENCOUNTER — Ambulatory Visit (INDEPENDENT_AMBULATORY_CARE_PROVIDER_SITE_OTHER): Payer: Medicare HMO | Admitting: Adult Health

## 2017-07-26 ENCOUNTER — Encounter: Payer: Self-pay | Admitting: Adult Health

## 2017-07-26 VITALS — BP 126/68 | HR 71 | Ht 59.0 in | Wt 136.4 lb

## 2017-07-26 DIAGNOSIS — E785 Hyperlipidemia, unspecified: Secondary | ICD-10-CM

## 2017-07-26 DIAGNOSIS — J4540 Moderate persistent asthma, uncomplicated: Secondary | ICD-10-CM | POA: Diagnosis not present

## 2017-07-26 DIAGNOSIS — J45909 Unspecified asthma, uncomplicated: Secondary | ICD-10-CM | POA: Diagnosis not present

## 2017-07-26 DIAGNOSIS — M48061 Spinal stenosis, lumbar region without neurogenic claudication: Secondary | ICD-10-CM

## 2017-07-26 DIAGNOSIS — J302 Other seasonal allergic rhinitis: Secondary | ICD-10-CM | POA: Diagnosis not present

## 2017-07-26 DIAGNOSIS — I10 Essential (primary) hypertension: Secondary | ICD-10-CM | POA: Diagnosis not present

## 2017-07-26 NOTE — Assessment & Plan Note (Signed)
Well controlled. Continue on current regimen

## 2017-07-26 NOTE — Assessment & Plan Note (Signed)
Continue follow with orthopedics

## 2017-07-26 NOTE — Patient Instructions (Signed)
Follow med calendar closely and bring to each visit.  Continue on low fat cholesterol diet  Low salt diet .  Follow up Dr. Melvyn Novas  In 3-4 months and As needed

## 2017-07-26 NOTE — Assessment & Plan Note (Signed)
Fat, low cholesterol diet

## 2017-07-26 NOTE — Assessment & Plan Note (Signed)
Well controlled  Patient's medications were reviewed today and patient education was given. Computerized medication calendar was adjusted/completed  Spirometry remains stable.   Plan  No changes.

## 2017-07-26 NOTE — Progress Notes (Signed)
@Patient  ID: Cassie Campbell, female    DOB: September 18, 1941, 76 y.o.   MRN: [id number]  Chief Complaint  Patient presents with  . 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 Follow in pulmonary clinic also for primary care with hbp/ hyperlipidemia complicated by MI (CAD )  10/15/14 and persistent mucus plug in RML/ RLL s/p fob 02/05/15 with marked improvement p lavage and no endobrchial lesions, just diffuse airway swelling .   TEST  BENIGN POSITIONAL VERTIGO (ICD-386.11)  OSTEOPENIA (ICD-733.90)  - DEXA 08/22/07 AP spine + 1.1, left femur -1.3, right femur -.8  - DEXA 10/06/09 + 1.6 left femur -1.6, right femur -.5  -DEXA 2014 no change  HYPERTENSION (ICD-401.9)  HYPERLIPIDEMIA (ICD-272.4)  - Target <100 ldl pos fm hx, hbp , MI  CAD -MI 2015 , 09/2014 NSTEMI s/p LHC with sig 2V dz. dLAD diffusely diseased and not suitable for PCI. unsuccessful RCA PCI d/t heavy calcifications OBESITY  - Target wt = 178 for BMI <30  ASTHMA (ICD-493.90) ([name] not active)  -Xolair s 8/05 >10/2006  -Mastered HFA December 30, 2008  ALLERGIC RHINITIS (ICD-477.9)  S/p cervical fusion 2000 ............................................................................ [name]...........................................................................Marland KitchenWert  - Td 05/20/2015  - Pneumovax 09/2003 and October 27, 2009 Prevnar 05/07/2014  - Colonscopy 10/28/2004  - CPX 11/19/13  - GYN per [name]'s office -Mammogram 2013 , 2014  MED CALENDAR 05/05/11 , 05/16/2013 , 08/06/2014 , 01/28/2015 , 08/26/2015 01/11/2017   07/26/2017 Follow up : Asthma/HTN [name] review  Patient returns for a 4 -month follow-up. Patient says overall that she is doing well. She has severe chronic asthma. She denies any flare  cough or wheezing. She continues on Symbicort and Singulair. Rare use of SABA . Spirometry today shows Mild airflow obstruction unchanged from 2017.  Tries to be active.  We reviewed all her medications and organized them into a medication count with patient education  Has chronic back pain . She follows with Dr. Louanne Skye with spinal stenosis .  Plans for back surgery later this month.   She has HTN . B/p has been well controlled. Discussed low fat /salt /cholesterol diet.    Allergies  Allergen Reactions  . Codeine Phosphate Nausea Only  . Aspirin Swelling and Rash  . Brilinta [Ticagrelor] Rash    Started Brilinta 09/2014 - rash - unsure which it is directly related to  . Diphenhydramine Hcl Rash  . Fish-Derived Products Swelling and Rash  . Penicillins Swelling and Rash    Immunization History  Administered Date(s) Administered  . Influenza Split 11/09/2011, 11/13/2012, 09/19/2014  . Influenza Whole 10/04/2007, 10/27/2009, 10/20/2010  . Influenza, High Dose Seasonal PF 10/06/2016  . Influenza,inj,Quad PF,36+ Mos 11/19/2013, 09/19/2014, 08/26/2015  . Pneumococcal Conjugate-13 05/07/2014  . Pneumococcal Polysaccharide-23 09/20/2003, 10/27/2009  . Tdap 09/19/2005, 05/20/2015    Past Medical History:  Diagnosis Date  . Allergic rhinitis   . Asthma    xolair s 8/05 ?11/07; mastered hfa 12/20/08  . Benign positional vertigo   . CAD (coronary artery disease)    a. 09/2014 NSTEMI s/p LHC with sig 2V dz. dLAD diffusely diseased and not suitable for PCI. unsuccessful RCA PCI d/t heavy calcifications  . Depression   . Heart attack ([place])    09/2014  . Hyperlipidemia    <  130 ldl pos fm hx, bp  . Hypertension   . Obesity   . Osteopenia    dexa 08/22/07 AP spine + 1.1, left femur -1.3, right femur -.8; dexa 10/06/09 +1.6, left femur =1.6, right femur -.  . PONV (postoperative nausea and vomiting)   . Ruptured disc, cervical     Tobacco History: History  Smoking Status  . Never  Smoker  Smokeless Tobacco  . Never Used   Counseling given: Not Answered   Outpatient Encounter Prescriptions as of 07/26/2017  Medication Sig  . amLODipine (NORVASC) 10 MG tablet TAKE 1 TABLET BY MOUTH EVERY DAY  . atorvastatin (LIPITOR) 80 MG tablet TAKE 1 TABLET(80 MG) BY MOUTH DAILY  . bisoprolol (ZEBETA) 5 MG tablet Take 1 tablet (5 mg total) by mouth daily.  . budesonide-formoterol (SYMBICORT) 160-4.5 MCG/ACT inhaler Inhale 2 puffs into the lungs 2 (two) times daily.  . cetirizine (ZYRTEC) 10 MG tablet Take 10 mg by mouth at bedtime as needed (itching and sneezing).   . Cholecalciferol (VITAMIN D3) 2000 units TABS Take 2,000 Units by mouth daily.  . clopidogrel (PLAVIX) 75 MG tablet TAKE 1 TABLET BY MOUTH EVERY DAY  . clotrimazole-betamethasone (LOTRISONE) cream Apply 1 application topically 2 (two) times daily as needed (itching).   Marland Kitchen dextromethorphan-guaiFENesin (MUCINEX DM) 30-600 MG per 12 hr tablet Take 1-2 tablets by mouth every 12 (twelve) hours as needed (cough).   . famotidine (PEPCID) 20 MG tablet Take 20 mg by mouth at bedtime.   . fluticasone (FLONASE) 50 MCG/ACT nasal spray Place 1-2 sprays into both nostrils daily as needed for allergies or rhinitis.  . furosemide (LASIX) 20 MG tablet Take 1 tablet (20 mg total) by mouth daily.  Marland Kitchen HYDROcodone-acetaminophen (NORCO/VICODIN) 5-325 MG tablet Take 1 tablet by mouth every 6 (six) hours as needed for moderate pain.  . isosorbide mononitrate (IMDUR) 60 MG 24 hr tablet TAKE 1 AND 1/2 TABLETS BY MOUTH EVERY MORNING AND 1/2 TABLET EVERY EVENING  . meclizine (ANTIVERT) 25 MG tablet TAKE 2 TABLETS BY MOUTH THREE TIMES DAILY AS NEEDED  . montelukast (SINGULAIR) 10 MG tablet TAKE 1 TABLET(10 MG) BY MOUTH AT BEDTIME  . Multiple Vitamin (MULTIVITAMIN) capsule Take 1 capsule by mouth every morning.   Marland Kitchen omeprazole (PRILOSEC) 20 MG capsule Take 20 mg by mouth daily before breakfast.   . oxymetazoline (AFRIN) 0.05 % nasal spray 2 puffs every  12 hours for 5 days as needed for stuffy nose and sinus problems  . PROAIR HFA 108 (90 Base) MCG/ACT inhaler INHALE 1 TO 2 PUFFS BY MOUTH EVERY 4 HOURS AS NEEDED FOR WHEEZING  . RANEXA 500 MG 12 hr tablet TAKE 1 TABLET(500 MG) BY MOUTH TWICE DAILY  . sodium chloride (OCEAN) 0.65 % nasal spray Place 2 sprays into the nose every 4 (four) hours as needed (nasal congestion).   . traMADol (ULTRAM) 50 MG tablet Take 1 tablet (50 mg total) by mouth every 4 (four) hours as needed (severe cough or pain).  Marland Kitchen ezetimibe (ZETIA) 10 MG tablet Take 1 tablet (10 mg total) by mouth daily.  . [DISCONTINUED] predniSONE (DELTASONE) 10 MG tablet Take  4 each am x 2 days,   2 each am x 2 days,  1 each am x 2 days and stop (Patient not taking: Reported on 07/26/2017)   No facility-administered encounter medications on file as of 07/26/2017.      Review of Systems  Constitutional:   No  weight loss, night sweats,  Fevers, chills, fatigue, or  lassitude.  HEENT:   No headaches,  Difficulty swallowing,  Tooth/dental problems, or  Sore throat,                No sneezing, itching, ear ache, nasal congestion, post nasal drip,   CV:  No chest pain,  Orthopnea, PND, swelling in lower extremities, anasarca, dizziness, palpitations, syncope.   GI  No heartburn, indigestion, abdominal pain, nausea, vomiting, diarrhea, change in bowel habits, loss of appetite, bloody stools.   Resp: No shortness of breath with exertion or at rest.  No excess mucus, no productive cough,  No non-productive cough,  No coughing up of blood.  No change in color of mucus.  No wheezing.  No chest wall deformity  Skin: no rash or lesions.  GU: no dysuria, change in color of urine, no urgency or frequency.  No flank pain, no hematuria   MS:  No joint pain or swelling.  No decreased range of motion.  + back pain.    Physical Exam  BP 126/68 (BP Location: Left Arm, Cuff Size: Normal)   Pulse 71   Ht 4\' 11"  (1.499 m)   Wt 136 lb 6.4 oz (61.9 kg)    LMP 12/21/1999   SpO2 97%   BMI 27.55 kg/m   GEN: A/Ox3; pleasant , NAD, elderly    HEENT:  [place]/AT,  EACs-clear, TMs-wnl, NOSE-clear, THROAT-clear, no lesions, no postnasal drip or exudate noted.   NECK:  Supple w/ fair ROM; no JVD; normal carotid impulses w/o bruits; no thyromegaly or nodules palpated; no lymphadenopathy.    RESP  Clear  P & A; w/o, wheezes/ rales/ or rhonchi. no accessory muscle use, no dullness to percussion  CARD:  RRR, no m/r/g, no peripheral edema, pulses intact, no cyanosis or clubbing.  GI:   Soft & nt; nml bowel sounds; no organomegaly or masses detected.   Musco: Warm bil, no deformities or joint swelling noted.   Neuro: alert, no focal deficits noted.    Skin: Warm, no lesions or rashes    Lab Results:  CBC  BMET   BNP No results found for: BNP   Imaging: No results found.   Assessment & Plan:   Moderate persistent asthma in adult without complication Well controlled  Patient's medications were reviewed today and patient education was given. Computerized medication calendar was adjusted/completed  Spirometry remains stable.   Plan  No changes.   Hyperlipidemia with target LDL less than 70 Fat, low cholesterol diet  Essential hypertension Well controlled. Continue on current regimen  Allergic rhinitis Doing well. No changes  Spinal stenosis of lumbar region Continue follow with orthopedics     Rexene Edison, NP 07/26/2017

## 2017-07-26 NOTE — Assessment & Plan Note (Signed)
Doing well No changes 

## 2017-07-27 NOTE — Progress Notes (Signed)
Chart and office note reviewed in detail  > agree with a/p as outlined    

## 2017-07-28 ENCOUNTER — Ambulatory Visit (INDEPENDENT_AMBULATORY_CARE_PROVIDER_SITE_OTHER): Payer: Medicare HMO | Admitting: Specialist

## 2017-07-28 ENCOUNTER — Encounter (INDEPENDENT_AMBULATORY_CARE_PROVIDER_SITE_OTHER): Payer: Self-pay | Admitting: Specialist

## 2017-07-28 VITALS — BP 127/71 | Ht 59.0 in | Wt 140.0 lb

## 2017-07-28 DIAGNOSIS — M4316 Spondylolisthesis, lumbar region: Secondary | ICD-10-CM

## 2017-07-28 DIAGNOSIS — M48062 Spinal stenosis, lumbar region with neurogenic claudication: Secondary | ICD-10-CM | POA: Diagnosis not present

## 2017-07-28 DIAGNOSIS — I2510 Atherosclerotic heart disease of native coronary artery without angina pectoris: Secondary | ICD-10-CM | POA: Diagnosis not present

## 2017-07-28 NOTE — Progress Notes (Addendum)
Office Visit Note   Patient: Cassie Campbell           Date of Birth: 1941/09/17           MRN: [id number] Visit Date: 07/28/2017              Requested by: Tanda Rockers, MD [address], [place] [address] PCP: Tanda Rockers, MD   Assessment & Plan: Visit Diagnoses:  1. Coronary artery disease involving native coronary artery of native heart without angina pectoris   2. Spondylolisthesis, lumbar region   3. Spinal stenosis of lumbar region with neurogenic claudication   76 year old female with lumbar spinal stenosis with neurogenic claudication symptoms left leg greater than right. She has been scheduled for  Surgery L4-5 decompression and fusion with pedicle sz 08/15/2017. Her last visit 04/11/2017, earlier that month she had seen her cardiologist Dr. Shelva Majestic had seen her and his note indicates That prior to surgery a Lexiscan myoview cardiac study should be done. Will try to facilitate obtaining the study as she has been in a holding pattern For nearly 4 months and wishes to have the surgery soon.   Plan:Plan:Avoid bending, stooping and avoid lifting weights greater than 10 lbs. Avoid prolong standing and walking. Order for a new walker with wheels. Surgery scheduling secretary Kandice Hams, will call you in the next week to schedule for surgery.  Surgery recommended is a one level lumbar fusion L4-5 and foramenotomy left L5 , fusion would be done with rods, screws and cage with local bone graft and allograft (donor bone graft). Take hydrocodone for for pain. Risk of surgery includes risk of infection 1 in 300 patients, bleeding 1/2% chance you would need a transfusion.   Risk to the nerves is one in 10,000. You will need to use a brace for 3 months and wean from the brace on the 4th month. Expect improved walking and standing tolerance. Expect relief of leg pain but numbness may persist depending on the length and degree of pressure that has been  present. Need approval and clearance for surgery by your cardiologist Dr. Shelva Majestic, he recommended a pre operative lexiscan myoview assessment of the heart prior to surgical intervention  Follow-Up Instructions: Return for 4 weeks for 2 week post op follow up.   Orders:  Orders Placed This Encounter  Procedures  . Ambulatory referral to Cardiology   No orders of the defined types were placed in this encounter.     Procedures: No procedures performed   Clinical Data: No additional findings.   Subjective: Chief Complaint  Patient presents with  . Lower Back - Pre-op Exam    76 year old female with several year history of back pain and radiation into the legs left greater than right. Pain and limitaiton of standing and walking is worsening over the last year to where she has difficulty standing or walking even from the front of the office to the room for exam. Sitting helps decrease the pain and she is taking hydrocodone 2-3 times a day to decrease the pain. No bowel or bladder difficulty, she does has constipation with the use of the hydrocodone. Has night pain with pain into the left leg posteriorly and laterally. She is scheduled for surgical decompression and fusion 8/27.  Has seen her primary care physician, Dr. Melvyn Novas. Last saw her cardiologist, Dr. Shelva Majestic in 03/2017.     Review of Systems  Constitutional: Positive for activity change  and appetite change.  HENT: Positive for facial swelling, postnasal drip, rhinorrhea and sinus pain.   Respiratory: Positive for shortness of breath and wheezing. Negative for apnea, cough and choking.   Cardiovascular: Positive for leg swelling. Negative for chest pain and palpitations.  Gastrointestinal: Positive for constipation. Negative for abdominal distention, abdominal pain, diarrhea, nausea and vomiting.  Genitourinary: Negative for difficulty urinating, dyspareunia, dysuria, enuresis and flank pain.  Musculoskeletal: Positive  for back pain and gait problem.  Skin: Negative for color change, pallor, rash and wound.  Neurological: Positive for weakness. Negative for dizziness, tremors, seizures, syncope, speech difficulty, numbness and headaches.  Hematological: Bruises/bleeds easily.  Psychiatric/Behavioral: Negative for agitation, behavioral problems, confusion, decreased concentration, dysphoric mood, hallucinations, self-injury, sleep disturbance and suicidal ideas. The patient is not nervous/anxious and is not hyperactive.      Objective: Vital Signs: BP 127/71 (BP Location: Left Arm, Patient Position: Sitting)   Ht 4\' 11"  (1.499 m)   Wt 140 lb (63.5 kg)   LMP 12/21/1999   BMI 28.28 kg/m   Physical Exam  Constitutional: She is oriented to person, place, and time. She appears well-developed and well-nourished.  HENT:  Head: Normocephalic and atraumatic.  Eyes: Pupils are equal, round, and reactive to light. EOM are normal.  Neck: Normal range of motion. Neck supple.  Pulmonary/Chest: Effort normal and breath sounds normal.  Abdominal: Soft. Bowel sounds are normal.  Neurological: She is alert and oriented to person, place, and time.  Skin: Skin is warm and dry.  Psychiatric: She has a normal mood and affect. Her behavior is normal. Judgment and thought content normal.    Back Exam   Tenderness  The patient is experiencing tenderness in the lumbar.  Range of Motion  Extension: abnormal  Flexion: normal  Lateral Bend Right: abnormal  Lateral Bend Left: abnormal  Rotation Right: abnormal  Rotation Left: abnormal   Muscle Strength  Right Quadriceps:  5/5  Left Quadriceps:  5/5  Right Hamstrings:  5/5  Left Hamstrings:  5/5   Tests  Straight leg raise right: negative Straight leg raise left: negative  Reflexes  Patellar: abnormal Achilles: abnormal Babinski's sign: normal   Other  Toe Walk: normal Heel Walk: normal Sensation: normal Gait: normal       Specialty Comments:   No specialty comments available.  Imaging: No results found.   PMFS History: Patient Active Problem List   Diagnosis Date Noted  . Gout of foot 04/11/2017  . Spinal stenosis of lumbar region 01/11/2017  . Dyspnea 06/10/2016  . Muscle strain of scapular region 01/22/2016  . CAD in native artery 10/28/2015  . Tinea cruris 07/20/2015  . Right bundle branch block 04/28/2015  . Pain in the chest   . Chest pain, atypical 11/06/2014  . HCAP (healthcare-associated pneumonia) 11/05/2014  . CAD (coronary artery disease)   . Chest pain 10/15/2014  . NSTEMI (non-ST elevated myocardial infarction) ([place]) 10/15/2014  . Health care maintenance 10/22/2013  . Arthralgia of hip 02/12/2013  . GERD (gastroesophageal reflux disease) 03/10/2012  . Intertriginous candidiasis 11/09/2011  . Atelectasis 02/24/2009  . BENIGN POSITIONAL VERTIGO 11/10/2007  . Hyperlipidemia with target LDL less than 70 10/05/2007  . Essential hypertension 10/05/2007  . Allergic rhinitis 10/05/2007  . Moderate persistent asthma in adult without complication 04/88/8916  . Disorder of bone and cartilage 10/05/2007   Past Medical History:  Diagnosis Date  . Allergic rhinitis   . Asthma    xolair s 8/05 ?11/07; mastered hfa 12/20/08  .  Benign positional vertigo   . CAD (coronary artery disease)    a. 09/2014 NSTEMI s/p LHC with sig 2V dz. dLAD diffusely diseased and not suitable for PCI. unsuccessful RCA PCI d/t heavy calcifications  . Depression   . Heart attack ([place])    09/2014  . Hyperlipidemia    <130 ldl pos fm hx, bp  . Hypertension   . Obesity   . Osteopenia    dexa 08/22/07 AP spine + 1.1, left femur -1.3, right femur -.8; dexa 10/06/09 +1.6, left femur =1.6, right femur -.  . PONV (postoperative nausea and vomiting)   . Ruptured disc, cervical     Family History  Problem Relation Age of Onset  . Diabetes Mother        AODM  . Stroke Mother   . Hypertension Mother   . Heart disease Father   . Diabetes  Sister        AODM  . Breast cancer Sister   . Hypertension Sister   . Breast cancer Daughter 54       dec--mets to liver/spine    Past Surgical History:  Procedure Laterality Date  . BREAST SURGERY  10-26-10   Rt. breast bx--for microcalcifications in rt. retroareolar region--dx was hyalinized fibroadenoma  . BUNIONECTOMY Bilateral   . CERVICAL DISC SURGERY  12/01  . LEFT HEART CATHETERIZATION WITH CORONARY ANGIOGRAM N/A 10/16/2014   Procedure: LEFT HEART CATHETERIZATION WITH CORONARY ANGIOGRAM;  Surgeon: Blane Ohara, MD;  Location: [facility] CATH LAB;  Service: Cardiovascular;  Laterality: N/A;  . PERCUTANEOUS CORONARY ROTOBLATOR INTERVENTION (PCI-R) N/A 10/17/2014   Procedure: PERCUTANEOUS CORONARY ROTOBLATOR INTERVENTION (PCI-R);  Surgeon: Troy Sine, MD;  Location: [facility] CATH LAB;  Service: Cardiovascular;  Laterality: N/A;  . VEIN LIGATION AND STRIPPING    . VIDEO BRONCHOSCOPY Bilateral 02/05/2015   Procedure: VIDEO BRONCHOSCOPY WITHOUT FLUORO;  Surgeon: Tanda Rockers, MD;  Location: [facility] ENDOSCOPY;  Service: Endoscopy;  Laterality: Bilateral;  . VULVA /PERINEUM BIOPSY  12-30-10   --epidermoid cyst   Social History   Occupational History  . retired Tour manager    Social History Main Topics  . Smoking status: Never Smoker  . Smokeless tobacco: Never Used  . Alcohol use No  . Drug use: No  . Sexual activity: No

## 2017-07-28 NOTE — Patient Instructions (Addendum)
Plan:Avoid bending, stooping and avoid lifting weights greater than 10 lbs. Avoid prolong standing and walking. Order for a new walker with wheels. Surgery scheduling secretary Kandice Hams, will call you in the next week to schedule for surgery.  Surgery recommended is a one level lumbar fusion L4-5 and foramenotomy left L5 , fusion would be done with rods, screws and cage with local bone graft and allograft (donor bone graft). Take hydrocodone for for pain. Risk of surgery includes risk of infection 1 in 300 patients, bleeding 1/2% chance you would need a transfusion.   Risk to the nerves is one in 10,000. You will need to use a brace for 3 months and wean from the brace on the 4th month. Expect improved walking and standing tolerance. Expect relief of leg pain but numbness may persist depending on the length and degree of pressure that has been present.  Need approval and clearance for surgery by your cardiologist Dr. Shelva Majestic, he recommended a pre operative lexiscan myoview assessment of the heart prior to surgical intervention.

## 2017-08-04 ENCOUNTER — Telehealth (HOSPITAL_COMMUNITY): Payer: Self-pay | Admitting: Specialist

## 2017-08-08 ENCOUNTER — Other Ambulatory Visit: Payer: Self-pay | Admitting: *Deleted

## 2017-08-08 ENCOUNTER — Encounter: Payer: Self-pay | Admitting: Internal Medicine

## 2017-08-08 ENCOUNTER — Encounter: Payer: Medicare HMO | Admitting: Internal Medicine

## 2017-08-08 DIAGNOSIS — I2510 Atherosclerotic heart disease of native coronary artery without angina pectoris: Secondary | ICD-10-CM

## 2017-08-08 DIAGNOSIS — Z01810 Encounter for preprocedural cardiovascular examination: Secondary | ICD-10-CM

## 2017-08-08 NOTE — Telephone Encounter (Signed)
User: Cherie Dark A Date/time: 08/04/17 12:25 PM  Comment: Returning Sherry's call in regards to patient and asked that she call me back for further details.   Context:  Outcome: Left Message  Phone number: [phone] Phone Type:   Comm. type: Telephone Call type: Outgoing  Contact: Samella Parr Relation to patient: Provider

## 2017-08-08 NOTE — Progress Notes (Signed)
This encounter was created in error - please disregard.

## 2017-08-09 ENCOUNTER — Telehealth (HOSPITAL_COMMUNITY): Payer: Self-pay

## 2017-08-09 ENCOUNTER — Other Ambulatory Visit (INDEPENDENT_AMBULATORY_CARE_PROVIDER_SITE_OTHER): Payer: Self-pay | Admitting: Specialist

## 2017-08-09 DIAGNOSIS — M4316 Spondylolisthesis, lumbar region: Secondary | ICD-10-CM

## 2017-08-09 NOTE — Pre-Procedure Instructions (Signed)
Cassie Campbell  08/09/2017      [place], [place] [address] Cassie Campbell [address] Phone: [phone] Fax: [phone]  [id number] [place], [name] [address] Phone: [phone] Fax: [phone]  [facility] [place], [place] [address] Phone: [phone] Fax: [phone]    Your procedure is scheduled on Monday, August 15, 2017  Report to [facility] Admitting Entrance "A" at 5:30 A.M.  Call this number if you have problems the morning of surgery:  [phone]   Remember:  Do not eat food or drink liquids after midnight on August 14, 2017  Follow your doctor's instructions for taking Plavix.   Take these medicines the morning of surgery with A SIP OF WATER:  AmLODipine (NORVASC), Bisoprolol (ZEBETA), Isosorbide mononitrate (IMDUR), Omeprazole (PRILOSEC), RANEXA, and Budesonide-formoterol (SYMBICORT). If needed Fluticasone (FLONASE) for allergies, NitroGLYCERIN (NITROSTAT) for chest pain (notifiy the nurse if you had to take this medicine prior to arrival), and [facility] HFA inhaler for cough or wheezing (bring with you the day of surgery).  7 days before surgery stop taking all Aspirins, Vitamins, Fish oils, and Herbal medications. Also stop all NSAIDS i.e. Advil, Motrin, Aleve, Anaprox, Naproxen, BC and Goody Powders.   Do not wear jewelry, make-up or nail polish.  Do not wear lotions, powders, or perfumes, or deoderant.  Do not shave 48 hours prior to surgery.  Men may shave face and neck.  Do not bring valuables to the hospital.  [facility] is not responsible for any belongings or valuables.  Contacts, dentures or bridgework may not be worn into surgery.  Leave your suitcase in the car.  After surgery it may be brought to  your room.  For patients admitted to the hospital, discharge time will be determined by your treatment team.  Patients discharged the day of surgery will not be allowed to drive home.   Special instructions:  [facility]- Preparing For Surgery  Before surgery, you can play an important role. Because skin is not sterile, your skin needs to be as free of germs as possible. You can reduce the number of germs on your skin by washing with CHG (chlorahexidine gluconate) Soap before surgery.  CHG is an antiseptic cleaner which kills germs and bonds with the skin to continue killing germs even after washing.  Please do not use if you have an allergy to CHG or antibacterial soaps. If your skin becomes reddened/irritated stop using the CHG.  Do not shave (including legs and underarms) for at least 48 hours prior to first CHG shower. It is OK to shave your face.  Please follow these instructions carefully.   1. Shower the NIGHT BEFORE SURGERY and the MORNING OF SURGERY with CHG.   2. If you chose to wash your hair, wash your hair first as usual with your normal shampoo.  3. After you shampoo, rinse your hair and body thoroughly to remove the shampoo.  4. Use CHG as you would any other liquid soap. You can apply CHG directly to the skin and wash gently with a scrungie or a clean washcloth.   5. Apply the CHG Soap to your body ONLY FROM THE NECK DOWN.  Do not use on open wounds or open sores. Avoid contact  with your eyes, ears, mouth and genitals (private parts). Wash genitals (private parts) with your normal soap.  6. Wash thoroughly, paying special attention to the area where your surgery will be performed.  7. Thoroughly rinse your body with warm water from the neck down.  8. DO NOT shower/wash with your normal soap after using and rinsing off the CHG Soap.  9. Pat yourself dry with a CLEAN TOWEL.   10. Wear CLEAN PAJAMAS   11. Place CLEAN SHEETS on your bed the night of your first shower  and DO NOT SLEEP WITH PETS.  Day of Surgery: Do not apply any deodorants/lotions. Please wear clean clothes to the hospital/surgery center.    Please read over the following fact sheets that you were given. Pain Booklet, Coughing and Deep Breathing, Blood Transfusion Information, MRSA Information and Surgical Site Infection Prevention

## 2017-08-09 NOTE — Telephone Encounter (Signed)
Encounter complete. 

## 2017-08-10 ENCOUNTER — Encounter (HOSPITAL_COMMUNITY)
Admission: RE | Admit: 2017-08-10 | Discharge: 2017-08-10 | Disposition: A | Discharge status: Home / Self Care | Payer: Medicare HMO | Source: Ambulatory Visit | Attending: Specialist | Admitting: Specialist

## 2017-08-10 ENCOUNTER — Encounter (HOSPITAL_COMMUNITY): Payer: Self-pay

## 2017-08-10 DIAGNOSIS — Z0183 Encounter for blood typing: Secondary | ICD-10-CM | POA: Diagnosis not present

## 2017-08-10 DIAGNOSIS — I10 Essential (primary) hypertension: Secondary | ICD-10-CM | POA: Diagnosis not present

## 2017-08-10 DIAGNOSIS — K219 Gastro-esophageal reflux disease without esophagitis: Secondary | ICD-10-CM | POA: Diagnosis not present

## 2017-08-10 DIAGNOSIS — R0609 Other forms of dyspnea: Secondary | ICD-10-CM | POA: Diagnosis not present

## 2017-08-10 DIAGNOSIS — E785 Hyperlipidemia, unspecified: Secondary | ICD-10-CM | POA: Diagnosis not present

## 2017-08-10 DIAGNOSIS — I4510 Unspecified right bundle-branch block: Secondary | ICD-10-CM | POA: Diagnosis not present

## 2017-08-10 DIAGNOSIS — Z01812 Encounter for preprocedural laboratory examination: Secondary | ICD-10-CM | POA: Diagnosis not present

## 2017-08-10 DIAGNOSIS — I2510 Atherosclerotic heart disease of native coronary artery without angina pectoris: Secondary | ICD-10-CM | POA: Diagnosis not present

## 2017-08-10 DIAGNOSIS — Z01818 Encounter for other preprocedural examination: Secondary | ICD-10-CM | POA: Diagnosis not present

## 2017-08-10 DIAGNOSIS — R9439 Abnormal result of other cardiovascular function study: Secondary | ICD-10-CM | POA: Diagnosis not present

## 2017-08-10 DIAGNOSIS — J45909 Unspecified asthma, uncomplicated: Secondary | ICD-10-CM | POA: Diagnosis not present

## 2017-08-10 DIAGNOSIS — Z79899 Other long term (current) drug therapy: Secondary | ICD-10-CM | POA: Diagnosis not present

## 2017-08-10 DIAGNOSIS — Z8249 Family history of ischemic heart disease and other diseases of the circulatory system: Secondary | ICD-10-CM | POA: Diagnosis not present

## 2017-08-10 DIAGNOSIS — I252 Old myocardial infarction: Secondary | ICD-10-CM | POA: Diagnosis not present

## 2017-08-10 DIAGNOSIS — Z7902 Long term (current) use of antithrombotics/antiplatelets: Secondary | ICD-10-CM | POA: Diagnosis not present

## 2017-08-10 DIAGNOSIS — Z7951 Long term (current) use of inhaled steroids: Secondary | ICD-10-CM | POA: Diagnosis not present

## 2017-08-10 DIAGNOSIS — Z01810 Encounter for preprocedural cardiovascular examination: Secondary | ICD-10-CM | POA: Diagnosis not present

## 2017-08-10 HISTORY — DX: Gastro-esophageal reflux disease without esophagitis: K21.9

## 2017-08-10 HISTORY — DX: Spondylolisthesis, lumbar region: M43.16

## 2017-08-10 HISTORY — DX: Unspecified osteoarthritis, unspecified site: M19.90

## 2017-08-10 LAB — TYPE AND SCREEN
ABO/RH(D): O POS
Antibody Screen: NEGATIVE

## 2017-08-10 LAB — CBC
HEMATOCRIT: 39.3 % (ref 36.0–46.0)
Hemoglobin: 13.3 g/dL (ref 12.0–15.0)
MCH: 29.7 pg (ref 26.0–34.0)
MCHC: 33.8 g/dL (ref 30.0–36.0)
MCV: 87.7 fL (ref 78.0–100.0)
PLATELETS: 441 10*3/uL — AB (ref 150–400)
RBC: 4.48 MIL/uL (ref 3.87–5.11)
RDW: 14.7 % (ref 11.5–15.5)
WBC: 7 10*3/uL (ref 4.0–10.5)

## 2017-08-10 LAB — HEPATIC FUNCTION PANEL
ALBUMIN: 3.8 g/dL (ref 3.5–5.0)
ALT: 24 U/L (ref 14–54)
AST: 31 U/L (ref 15–41)
Alkaline Phosphatase: 80 U/L (ref 38–126)
BILIRUBIN TOTAL: 0.9 mg/dL (ref 0.3–1.2)
Bilirubin, Direct: 0.1 mg/dL (ref 0.1–0.5)
Indirect Bilirubin: 0.8 mg/dL (ref 0.3–0.9)
TOTAL PROTEIN: 8 g/dL (ref 6.5–8.1)

## 2017-08-10 LAB — BASIC METABOLIC PANEL
Anion gap: 9 (ref 5–15)
BUN: 6 mg/dL (ref 6–20)
CHLORIDE: 104 mmol/L (ref 101–111)
CO2: 26 mmol/L (ref 22–32)
CREATININE: 0.68 mg/dL (ref 0.44–1.00)
Calcium: 9.9 mg/dL (ref 8.9–10.3)
GFR calc Af Amer: >60 mL/min (ref >60)
GFR calc non Af Amer: >60 mL/min (ref >60)
Glucose, Bld: 102 mg/dL — ABNORMAL HIGH (ref 65–99)
POTASSIUM: 4.2 mmol/L (ref 3.5–5.1)
Sodium: 139 mmol/L (ref 135–145)

## 2017-08-10 LAB — ABO/RH: ABO/RH(D): O POS

## 2017-08-10 LAB — SURGICAL PCR SCREEN
MRSA, PCR: NEGATIVE
Staphylococcus aureus: NEGATIVE

## 2017-08-10 NOTE — Progress Notes (Signed)
PCP - Dr. Christinia Gully- [facility]  Cardiologist - Dr. Shelva Majestic- [facility]  Chest x-ray - 07/26/17 (E)  EKG - 04/01/17 (E)  Stress Test - 08/11/17  ECHO - 10/15/14 (E)  Cardiac Cath - 10/16/14 (E)  Sleep Study - Denies CPAP - None  Chart will be given to anesthesia awaiting cardiac clearance. Pt has a stress test scheduled for 08/11/17. Pt also asked to confirm with her doctor about when to stop taking her Plavix.  Pt denies having chest pain, sob, or fever at this time. All instructions explained to the pt, with a verbal understanding of the material. Pt agrees to go over the instructions while at home for a better understanding. The opportunity to ask questions was provided.

## 2017-08-11 ENCOUNTER — Ambulatory Visit (HOSPITAL_COMMUNITY)
Admission: RE | Admit: 2017-08-11 | Discharge: 2017-08-11 | Disposition: A | Discharge status: Home / Self Care | Payer: Medicare HMO | Source: Ambulatory Visit | Attending: Cardiology | Admitting: Cardiology

## 2017-08-11 ENCOUNTER — Telehealth: Payer: Self-pay | Admitting: *Deleted

## 2017-08-11 DIAGNOSIS — Z7902 Long term (current) use of antithrombotics/antiplatelets: Secondary | ICD-10-CM | POA: Diagnosis not present

## 2017-08-11 DIAGNOSIS — Z01810 Encounter for preprocedural cardiovascular examination: Secondary | ICD-10-CM | POA: Diagnosis not present

## 2017-08-11 DIAGNOSIS — R9439 Abnormal result of other cardiovascular function study: Secondary | ICD-10-CM | POA: Insufficient documentation

## 2017-08-11 DIAGNOSIS — Z01818 Encounter for other preprocedural examination: Secondary | ICD-10-CM

## 2017-08-11 DIAGNOSIS — I10 Essential (primary) hypertension: Secondary | ICD-10-CM | POA: Insufficient documentation

## 2017-08-11 DIAGNOSIS — Z79899 Other long term (current) drug therapy: Secondary | ICD-10-CM | POA: Diagnosis not present

## 2017-08-11 DIAGNOSIS — I252 Old myocardial infarction: Secondary | ICD-10-CM | POA: Insufficient documentation

## 2017-08-11 DIAGNOSIS — R0609 Other forms of dyspnea: Secondary | ICD-10-CM | POA: Insufficient documentation

## 2017-08-11 DIAGNOSIS — Z8249 Family history of ischemic heart disease and other diseases of the circulatory system: Secondary | ICD-10-CM | POA: Insufficient documentation

## 2017-08-11 DIAGNOSIS — K219 Gastro-esophageal reflux disease without esophagitis: Secondary | ICD-10-CM | POA: Insufficient documentation

## 2017-08-11 DIAGNOSIS — Z7951 Long term (current) use of inhaled steroids: Secondary | ICD-10-CM | POA: Insufficient documentation

## 2017-08-11 DIAGNOSIS — Z01812 Encounter for preprocedural laboratory examination: Secondary | ICD-10-CM | POA: Diagnosis not present

## 2017-08-11 DIAGNOSIS — E785 Hyperlipidemia, unspecified: Secondary | ICD-10-CM | POA: Insufficient documentation

## 2017-08-11 DIAGNOSIS — I2510 Atherosclerotic heart disease of native coronary artery without angina pectoris: Secondary | ICD-10-CM

## 2017-08-11 DIAGNOSIS — Z0183 Encounter for blood typing: Secondary | ICD-10-CM | POA: Diagnosis not present

## 2017-08-11 DIAGNOSIS — J45909 Unspecified asthma, uncomplicated: Secondary | ICD-10-CM | POA: Insufficient documentation

## 2017-08-11 DIAGNOSIS — I4510 Unspecified right bundle-branch block: Secondary | ICD-10-CM | POA: Insufficient documentation

## 2017-08-11 LAB — MYOCARDIAL PERFUSION IMAGING
CHL CUP NUCLEAR SDS: 3
CHL CUP NUCLEAR SRS: 2
CHL CUP RESTING HR STRESS: 64 {beats}/min
LV dias vol: 62 mL (ref 46–106)
LVSYSVOL: 19 mL
Peak HR: 92 {beats}/min
SSS: 4
TID: 1.25

## 2017-08-11 MED ORDER — TECHNETIUM TC 99M TETROFOSMIN IV KIT
30.9000 | PACK | Freq: Once | INTRAVENOUS | Status: AC | PRN
  Administered 2017-08-11: 30.9 via INTRAVENOUS
  Filled 2017-08-11: qty 31

## 2017-08-11 MED ORDER — TECHNETIUM TC 99M TETROFOSMIN IV KIT
10.8000 | PACK | Freq: Once | INTRAVENOUS | Status: AC | PRN
  Administered 2017-08-11: 10.8 via INTRAVENOUS
  Filled 2017-08-11: qty 11

## 2017-08-11 MED ORDER — REGADENOSON 0.4 MG/5ML IV SOLN
0.4000 mg | Freq: Once | INTRAVENOUS | Status: AC
  Administered 2017-08-11: 0.4 mg via INTRAVENOUS

## 2017-08-11 NOTE — Telephone Encounter (Signed)
Returned the call to [facility] the surgery scheduler to let her know that as soon as it has been read, we will let the patient and her know the results. She verbalized her understanding.

## 2017-08-11 NOTE — Progress Notes (Addendum)
Anesthesia Chart Review:  Pt is a 76 year old female scheduled for L4-5 transforaminal lumbar interbody fusion with Depuy cage, rods, and screws, local and allograft bone graft, Vivigen on 08/15/2017 with Basil Dess, MD  - Pulmonologist is Christinia Gully, MD - Cardiologist is Shelva Majestic, MD who cleared pt for surgery in comment on stress test results.   PMH includes:  CAD (NSTEMI 2015 -> diffusely diseased LAD not suitable for PCI, unsuccessful PCI of RCA), HTN, hyperlipidemia, asthma, post-op N/V, GERD. Never smoker. BMI 28  Medications include: Amlodipine, Lipitor, bisoprolol, Symbicort, Plavix, Zetia, Pepcid, Lasix, Imdur, Prilosec, albuterol, Ranexa. Pt to hold plavix 5 days before surgery.   Preoperative labs reviewed.    CXR 07/26/17: Lower lobe atelectatic change bilaterally. No edema or consolidation. There are foci of coronary artery calcification noted.  EKG 04/06/17: NSR. RBBB.   Nuclear stress test 08/11/17:  1. EF 70%, normal wall motion.  2. Fixed small, mild mid to apical inferior perfusion defect. Given normal wall motion, possible soft tissue attenuation.  No ischemia.  3. Low risk study.  Echo 10/15/14:  - Adverse outcomes: Technically difficult study with limited echowindows. - Left ventricle: The cavity size was normal. Wall thickness wasincreased in a pattern of mild LVH. Systolic function was normal.The estimated ejection fraction was in the range of 55% to 60%.Wall motion was normal; there were no regional wall motion abnormalities. The study is not technically sufficient to allowevaluation of LV diastolic function. - Mitral valve: Calcified annulus. There was trivial regurgitation. - Left atrium: The atrium was normal in size. - Inferior vena cava: The vessel was normal in size. The  respirophasic diameter changes were in the normal range (>= 50%),consistent with normal central venous pressure. - Impressions: LVEF 55-60%, mild LVH, indeterminate diastolic  function, normalLA size.  Cardiac cath 10/16/14:  - LM: mildly calcified  - LAD: heavily calcified throughout. Diffuse 20% proximal, 40-50% mid-vessel, 70% distal. D1, D2, and D3 small in size with minor irregularities - LCX: normal in size and nondominant. Moderately calcified with 20-30% mid-vessel stenosis - RCA: normal in size and dominant. Vessel heavily calcified, especially mid and distal segments. 20% proximal, 95% mid, 99% distal. Distal vessel into the PDA is underfilling and could not be evaluated although there might be some ostial PDA disease - Conclusions:  1. Significant 2 vessel coronary artery disease. The distal LAD is diffusely diseased and not suitable for revascularization. The culprit for non-ST elevation myocardial infarction is likely the RCA which is heavily calcified. 2. Normal LV systolic function by echo. Mildly elevated left ventricular end-diastolic pressure. 3. Unsuccessful RCA PCI due to inability to dilate the lesion as the result of heavy calcifications on 2 attempts (10/28 and 10/17/14)  If no changes, I anticipate pt can proceed with surgery as scheduled.   Willeen Cass, FNP-BC [facility] Short Stay Surgical Center/Anesthesiology Phone: [phone] 08/12/2017 1:16 PM

## 2017-08-11 NOTE — Telephone Encounter (Signed)
The patient came in for a lexiscan stress test today.  She stated that she is due to have spinal surgery on Monday 8/23 and needed to have the test done prior to getting cardiac clearance per Dr. Claiborne Billings.  She would like to know how long she needs to hold her plavix. It was explained that the results needed to be read first to determine if cardiac clearance could be given. She was informed that she will get a call back as soon as the results have been read and we could provide her with more information. She verbalized her understanding.

## 2017-08-11 NOTE — Telephone Encounter (Signed)
Follow up   Cassie Campbell the surgery scheduler called [phone] and stated that she really needs to know the results. I explained to her that they would be provided when ready. She states surgery is Monday and she needs to know asap

## 2017-08-12 NOTE — Telephone Encounter (Signed)
Call was placed to [facility] the surgical scheduler at [place]. Per the DOD, the patient needs to hold Plavix for at least 5 days prior to surgery. Surgery is scheduled for Monday 8/27.  Her stress test results:   The left ventricular ejection fraction is hyperdynamic (>65%).  Nuclear stress EF: 70%.  There was no ST segment deviation noted during stress.  The study is normal.   1. EF 70%, normal wall motion.  2. Fixed small, mild mid to apical inferior perfusion defect. Given normal wall motion, possible soft tissue attenuation.  No ischemia.   Low risk study.   The patient stated that she has held Plavix since yesterday 8/23. Will wait on the return call from [facility] for the next steps and will route to Dr. Claiborne Billings for his surgical clearance recommendations.

## 2017-08-12 NOTE — Telephone Encounter (Signed)
Cassie Campbell at [facility] surgical has been called with a detail message stating that the patient is cleared for surgery, per Dr. Claiborne Billings. The patient has held her Plavix. Monday will be day five. She has been informed to call back with any questions.

## 2017-08-12 NOTE — Telephone Encounter (Signed)
Patient has been called and instructed to continue to hold her Plavix and that per Dr. Claiborne Billings she is cleared for surgery. She verbalized her understanding and appreciation.

## 2017-08-12 NOTE — Telephone Encounter (Signed)
Okay for surgery, hold Plavix for 5 days prior to surgery

## 2017-08-15 ENCOUNTER — Inpatient Hospital Stay (HOSPITAL_COMMUNITY): Payer: Medicare HMO | Admitting: Emergency Medicine

## 2017-08-15 ENCOUNTER — Encounter (HOSPITAL_COMMUNITY): Payer: Self-pay

## 2017-08-15 ENCOUNTER — Inpatient Hospital Stay (HOSPITAL_COMMUNITY): Payer: Medicare HMO

## 2017-08-15 ENCOUNTER — Inpatient Hospital Stay (HOSPITAL_COMMUNITY): Payer: Medicare HMO | Admitting: Anesthesiology

## 2017-08-15 ENCOUNTER — Encounter (HOSPITAL_COMMUNITY)
Admission: RE | Disposition: A | Discharge status: Home Health | Payer: Self-pay | Source: Ambulatory Visit | Attending: Specialist

## 2017-08-15 ENCOUNTER — Inpatient Hospital Stay (HOSPITAL_COMMUNITY)
Admission: RE | Admit: 2017-08-15 | Discharge: 2017-08-18 | DRG: 454 | Disposition: A | Discharge status: Home Health | Payer: Medicare HMO | Source: Ambulatory Visit | Attending: Specialist | Admitting: Specialist

## 2017-08-15 DIAGNOSIS — I2510 Atherosclerotic heart disease of native coronary artery without angina pectoris: Secondary | ICD-10-CM | POA: Diagnosis not present

## 2017-08-15 DIAGNOSIS — E785 Hyperlipidemia, unspecified: Secondary | ICD-10-CM | POA: Diagnosis present

## 2017-08-15 DIAGNOSIS — M48062 Spinal stenosis, lumbar region with neurogenic claudication: Secondary | ICD-10-CM | POA: Diagnosis not present

## 2017-08-15 DIAGNOSIS — Z888 Allergy status to other drugs, medicaments and biological substances status: Secondary | ICD-10-CM

## 2017-08-15 DIAGNOSIS — Z886 Allergy status to analgesic agent status: Secondary | ICD-10-CM

## 2017-08-15 DIAGNOSIS — Z91013 Allergy to seafood: Secondary | ICD-10-CM

## 2017-08-15 DIAGNOSIS — M4316 Spondylolisthesis, lumbar region: Secondary | ICD-10-CM | POA: Diagnosis not present

## 2017-08-15 DIAGNOSIS — D62 Acute posthemorrhagic anemia: Secondary | ICD-10-CM | POA: Diagnosis not present

## 2017-08-15 DIAGNOSIS — I252 Old myocardial infarction: Secondary | ICD-10-CM

## 2017-08-15 DIAGNOSIS — Z7902 Long term (current) use of antithrombotics/antiplatelets: Secondary | ICD-10-CM | POA: Diagnosis not present

## 2017-08-15 DIAGNOSIS — Z8249 Family history of ischemic heart disease and other diseases of the circulatory system: Secondary | ICD-10-CM

## 2017-08-15 DIAGNOSIS — Z7951 Long term (current) use of inhaled steroids: Secondary | ICD-10-CM | POA: Diagnosis not present

## 2017-08-15 DIAGNOSIS — Z79899 Other long term (current) drug therapy: Secondary | ICD-10-CM | POA: Diagnosis not present

## 2017-08-15 DIAGNOSIS — Z885 Allergy status to narcotic agent status: Secondary | ICD-10-CM | POA: Diagnosis not present

## 2017-08-15 DIAGNOSIS — E669 Obesity, unspecified: Secondary | ICD-10-CM | POA: Diagnosis present

## 2017-08-15 DIAGNOSIS — K219 Gastro-esophageal reflux disease without esophagitis: Secondary | ICD-10-CM | POA: Diagnosis present

## 2017-08-15 DIAGNOSIS — J309 Allergic rhinitis, unspecified: Secondary | ICD-10-CM | POA: Diagnosis not present

## 2017-08-15 DIAGNOSIS — Z880 Allergy status to penicillin: Secondary | ICD-10-CM | POA: Diagnosis not present

## 2017-08-15 DIAGNOSIS — I10 Essential (primary) hypertension: Secondary | ICD-10-CM | POA: Diagnosis present

## 2017-08-15 DIAGNOSIS — Z419 Encounter for procedure for purposes other than remedying health state, unspecified: Secondary | ICD-10-CM

## 2017-08-15 DIAGNOSIS — Z981 Arthrodesis status: Secondary | ICD-10-CM

## 2017-08-15 DIAGNOSIS — F329 Major depressive disorder, single episode, unspecified: Secondary | ICD-10-CM | POA: Diagnosis not present

## 2017-08-15 DIAGNOSIS — M4326 Fusion of spine, lumbar region: Secondary | ICD-10-CM | POA: Diagnosis not present

## 2017-08-15 DIAGNOSIS — M48061 Spinal stenosis, lumbar region without neurogenic claudication: Secondary | ICD-10-CM | POA: Diagnosis not present

## 2017-08-15 SURGERY — POSTERIOR LUMBAR FUSION 1 LEVEL
Anesthesia: General | Site: Spine Lumbar | Laterality: Left

## 2017-08-15 MED ORDER — EPHEDRINE SULFATE 50 MG/ML IJ SOLN
INTRAMUSCULAR | Status: DC | PRN
  Administered 2017-08-15 (×3): 10 mg via INTRAVENOUS

## 2017-08-15 MED ORDER — CHLORHEXIDINE GLUCONATE 4 % EX LIQD: 60.0000 mL | Freq: Once | CUTANEOUS | Status: DC

## 2017-08-15 MED ORDER — OXYCODONE HCL 5 MG/5ML PO SOLN: 5.0000 mg | Freq: Once | ORAL | Status: DC | PRN

## 2017-08-15 MED ORDER — SODIUM CHLORIDE 0.9 % IV SOLN
INTRAVENOUS | Status: DC
  Administered 2017-08-15: 16:00:00 via INTRAVENOUS

## 2017-08-15 MED ORDER — METHOCARBAMOL 500 MG PO TABS
500.0000 mg | ORAL_TABLET | Freq: Four times a day (QID) | ORAL | Status: DC | PRN
  Filled 2017-08-15: qty 1

## 2017-08-15 MED ORDER — MECLIZINE HCL 25 MG PO TABS
50.0000 mg | ORAL_TABLET | Freq: Three times a day (TID) | ORAL | Status: DC | PRN
  Administered 2017-08-16: 25 mg via ORAL
  Filled 2017-08-15: qty 2

## 2017-08-15 MED ORDER — SALINE SPRAY 0.65 % NA SOLN
2.0000 | NASAL | Status: DC | PRN
  Filled 2017-08-15: qty 44

## 2017-08-15 MED ORDER — MORPHINE SULFATE (PF) 2 MG/ML IV SOLN: 1.0000 mg | INTRAVENOUS | Status: DC | PRN

## 2017-08-15 MED ORDER — DEXAMETHASONE SODIUM PHOSPHATE 10 MG/ML IJ SOLN
INTRAMUSCULAR | Status: DC | PRN
  Administered 2017-08-15: 10 mg via INTRAVENOUS

## 2017-08-15 MED ORDER — BUPIVACAINE HCL 0.5 % IJ SOLN
INTRAMUSCULAR | Status: DC | PRN
  Administered 2017-08-15: 20 mL

## 2017-08-15 MED ORDER — FENTANYL CITRATE (PF) 250 MCG/5ML IJ SOLN
INTRAMUSCULAR | Status: AC
  Filled 2017-08-15: qty 5

## 2017-08-15 MED ORDER — HYDROCODONE-ACETAMINOPHEN 5-325 MG PO TABS
1.0000 | ORAL_TABLET | ORAL | Status: DC | PRN
  Administered 2017-08-16 – 2017-08-18 (×2): 1 via ORAL
  Filled 2017-08-15 (×2): qty 1

## 2017-08-15 MED ORDER — POLYETHYLENE GLYCOL 3350 17 G PO PACK: 17.0000 g | PACK | Freq: Every day | ORAL | Status: DC | PRN

## 2017-08-15 MED ORDER — ACETAMINOPHEN 650 MG RE SUPP: 650.0000 mg | RECTAL | Status: DC | PRN

## 2017-08-15 MED ORDER — ONDANSETRON HCL 4 MG/2ML IJ SOLN
4.0000 mg | Freq: Four times a day (QID) | INTRAMUSCULAR | Status: DC | PRN
  Administered 2017-08-15 (×2): 4 mg via INTRAVENOUS
  Filled 2017-08-15 (×2): qty 2

## 2017-08-15 MED ORDER — DM-GUAIFENESIN ER 30-600 MG PO TB12
1.0000 | ORAL_TABLET | Freq: Two times a day (BID) | ORAL | Status: DC | PRN
  Administered 2017-08-17: 1 via ORAL
  Filled 2017-08-15: qty 1

## 2017-08-15 MED ORDER — FLEET ENEMA 7-19 GM/118ML RE ENEM: 1.0000 | ENEMA | Freq: Once | RECTAL | Status: DC | PRN

## 2017-08-15 MED ORDER — OXYCODONE HCL 5 MG PO TABS: 5.0000 mg | ORAL_TABLET | Freq: Once | ORAL | Status: DC | PRN

## 2017-08-15 MED ORDER — RANOLAZINE ER 500 MG PO TB12
500.0000 mg | ORAL_TABLET | Freq: Two times a day (BID) | ORAL | Status: DC
  Administered 2017-08-15 – 2017-08-18 (×6): 500 mg via ORAL
  Filled 2017-08-15 (×6): qty 1

## 2017-08-15 MED ORDER — MOMETASONE FURO-FORMOTEROL FUM 200-5 MCG/ACT IN AERO
2.0000 | INHALATION_SPRAY | Freq: Two times a day (BID) | RESPIRATORY_TRACT | Status: DC
  Administered 2017-08-16 – 2017-08-18 (×5): 2 via RESPIRATORY_TRACT
  Filled 2017-08-15: qty 8.8

## 2017-08-15 MED ORDER — NITROGLYCERIN 0.4 MG SL SUBL: 0.4000 mg | SUBLINGUAL_TABLET | SUBLINGUAL | Status: DC | PRN

## 2017-08-15 MED ORDER — VITAMIN D 1000 UNITS PO TABS
2000.0000 [IU] | ORAL_TABLET | Freq: Every day | ORAL | Status: DC
  Administered 2017-08-16 – 2017-08-18 (×3): 2000 [IU] via ORAL
  Filled 2017-08-15 (×5): qty 2

## 2017-08-15 MED ORDER — SUGAMMADEX SODIUM 200 MG/2ML IV SOLN
INTRAVENOUS | Status: AC
  Filled 2017-08-15: qty 2

## 2017-08-15 MED ORDER — LACTATED RINGERS IV SOLN
INTRAVENOUS | Status: DC | PRN
  Administered 2017-08-15 (×2): via INTRAVENOUS

## 2017-08-15 MED ORDER — THROMBIN 20000 UNITS EX SOLR
CUTANEOUS | Status: AC
  Filled 2017-08-15: qty 20000

## 2017-08-15 MED ORDER — PHENYLEPHRINE HCL 10 MG/ML IJ SOLN
INTRAMUSCULAR | Status: DC | PRN
  Administered 2017-08-15 (×2): 80 ug via INTRAVENOUS

## 2017-08-15 MED ORDER — CLOPIDOGREL BISULFATE 75 MG PO TABS
75.0000 mg | ORAL_TABLET | Freq: Every day | ORAL | Status: DC
  Administered 2017-08-16 – 2017-08-18 (×3): 75 mg via ORAL
  Filled 2017-08-15 (×3): qty 1

## 2017-08-15 MED ORDER — DEXAMETHASONE SODIUM PHOSPHATE 10 MG/ML IJ SOLN
INTRAMUSCULAR | Status: AC
Start: 2017-08-15 — End: 2017-08-15
  Filled 2017-08-15: qty 1

## 2017-08-15 MED ORDER — CELECOXIB 200 MG PO CAPS
200.0000 mg | ORAL_CAPSULE | Freq: Two times a day (BID) | ORAL | Status: DC
  Administered 2017-08-15 – 2017-08-18 (×6): 200 mg via ORAL
  Filled 2017-08-15 (×6): qty 1

## 2017-08-15 MED ORDER — ALBUTEROL SULFATE (2.5 MG/3ML) 0.083% IN NEBU: 3.0000 mL | INHALATION_SOLUTION | RESPIRATORY_TRACT | Status: DC | PRN

## 2017-08-15 MED ORDER — SUGAMMADEX SODIUM 200 MG/2ML IV SOLN
INTRAVENOUS | Status: DC | PRN
  Administered 2017-08-15: 130 mg via INTRAVENOUS

## 2017-08-15 MED ORDER — PHENYLEPHRINE 40 MCG/ML (10ML) SYRINGE FOR IV PUSH (FOR BLOOD PRESSURE SUPPORT)
PREFILLED_SYRINGE | INTRAVENOUS | Status: AC
  Filled 2017-08-15: qty 10

## 2017-08-15 MED ORDER — ISOSORBIDE MONONITRATE ER 30 MG PO TB24
30.0000 mg | ORAL_TABLET | Freq: Every day | ORAL | Status: DC
  Administered 2017-08-15 – 2017-08-17 (×3): 30 mg via ORAL
  Filled 2017-08-15 (×3): qty 1

## 2017-08-15 MED ORDER — MENTHOL 3 MG MT LOZG: 1.0000 | LOZENGE | OROMUCOSAL | Status: DC | PRN

## 2017-08-15 MED ORDER — VANCOMYCIN HCL IN DEXTROSE 1-5 GM/200ML-% IV SOLN
1000.0000 mg | INTRAVENOUS | Status: AC
  Administered 2017-08-15: 1000 mg via INTRAVENOUS
  Filled 2017-08-15: qty 200

## 2017-08-15 MED ORDER — ATORVASTATIN CALCIUM 80 MG PO TABS
80.0000 mg | ORAL_TABLET | Freq: Every day | ORAL | Status: DC
  Administered 2017-08-15 – 2017-08-17 (×3): 80 mg via ORAL
  Filled 2017-08-15 (×3): qty 1

## 2017-08-15 MED ORDER — THROMBIN 20000 UNITS EX SOLR
CUTANEOUS | Status: DC | PRN
  Administered 2017-08-15: 20 mL via TOPICAL

## 2017-08-15 MED ORDER — BISOPROLOL FUMARATE 5 MG PO TABS
5.0000 mg | ORAL_TABLET | Freq: Every day | ORAL | Status: DC
  Administered 2017-08-16 – 2017-08-18 (×3): 5 mg via ORAL
  Filled 2017-08-15 (×5): qty 1

## 2017-08-15 MED ORDER — PROPOFOL 10 MG/ML IV BOLUS
INTRAVENOUS | Status: AC
  Filled 2017-08-15: qty 40

## 2017-08-15 MED ORDER — ROCURONIUM BROMIDE 10 MG/ML (PF) SYRINGE
PREFILLED_SYRINGE | INTRAVENOUS | Status: AC
Start: 2017-08-15 — End: 2017-08-15
  Filled 2017-08-15: qty 5

## 2017-08-15 MED ORDER — METHOCARBAMOL 1000 MG/10ML IJ SOLN: 500.0000 mg | Freq: Four times a day (QID) | INTRAVENOUS | Status: DC | PRN

## 2017-08-15 MED ORDER — SODIUM CHLORIDE 0.9 % IV SOLN: 250.0000 mL | INTRAVENOUS | Status: DC

## 2017-08-15 MED ORDER — ONDANSETRON HCL 4 MG/2ML IJ SOLN
INTRAMUSCULAR | Status: AC
  Filled 2017-08-15: qty 2

## 2017-08-15 MED ORDER — CLOTRIMAZOLE 1 % EX CREA
1.0000 "application " | TOPICAL_CREAM | Freq: Two times a day (BID) | CUTANEOUS | Status: DC
  Administered 2017-08-15 – 2017-08-16 (×2): 1 via TOPICAL
  Filled 2017-08-15: qty 15

## 2017-08-15 MED ORDER — PHENYLEPHRINE HCL 10 MG/ML IJ SOLN
INTRAVENOUS | Status: DC | PRN
  Administered 2017-08-15: 20 ug/min via INTRAVENOUS

## 2017-08-15 MED ORDER — BISACODYL 5 MG PO TBEC: 5.0000 mg | DELAYED_RELEASE_TABLET | Freq: Every day | ORAL | Status: DC | PRN

## 2017-08-15 MED ORDER — HYDROMORPHONE HCL 1 MG/ML IJ SOLN: 0.2500 mg | INTRAMUSCULAR | Status: DC | PRN

## 2017-08-15 MED ORDER — PANTOPRAZOLE SODIUM 40 MG PO TBEC
40.0000 mg | DELAYED_RELEASE_TABLET | Freq: Every day | ORAL | Status: DC
  Administered 2017-08-16 – 2017-08-18 (×3): 40 mg via ORAL
  Filled 2017-08-15 (×3): qty 1

## 2017-08-15 MED ORDER — ALBUMIN HUMAN 5 % IV SOLN
INTRAVENOUS | Status: DC | PRN
  Administered 2017-08-15 (×2): via INTRAVENOUS

## 2017-08-15 MED ORDER — HEMOSTATIC AGENTS (NO CHARGE) OPTIME
TOPICAL | Status: DC | PRN
  Administered 2017-08-15: 1 via TOPICAL

## 2017-08-15 MED ORDER — MULTIVITAMINS PO CAPS: 1.0000 | ORAL_CAPSULE | Freq: Every day | ORAL | Status: DC

## 2017-08-15 MED ORDER — MIDAZOLAM HCL 2 MG/2ML IJ SOLN
INTRAMUSCULAR | Status: AC
  Filled 2017-08-15: qty 2

## 2017-08-15 MED ORDER — ALUM & MAG HYDROXIDE-SIMETH 200-200-20 MG/5ML PO SUSP: 30.0000 mL | Freq: Four times a day (QID) | ORAL | Status: DC | PRN

## 2017-08-15 MED ORDER — ACETAMINOPHEN 325 MG PO TABS
650.0000 mg | ORAL_TABLET | ORAL | Status: DC | PRN
  Administered 2017-08-17: 650 mg via ORAL
  Filled 2017-08-15: qty 2

## 2017-08-15 MED ORDER — SODIUM CHLORIDE 0.9% FLUSH: 3.0000 mL | INTRAVENOUS | Status: DC | PRN

## 2017-08-15 MED ORDER — LORATADINE 10 MG PO TABS
10.0000 mg | ORAL_TABLET | Freq: Every day | ORAL | Status: DC
  Administered 2017-08-16 – 2017-08-18 (×3): 10 mg via ORAL
  Filled 2017-08-15 (×3): qty 1

## 2017-08-15 MED ORDER — BUPIVACAINE HCL (PF) 0.5 % IJ SOLN
INTRAMUSCULAR | Status: AC
  Filled 2017-08-15: qty 30

## 2017-08-15 MED ORDER — ONDANSETRON HCL 4 MG/2ML IJ SOLN
INTRAMUSCULAR | Status: DC | PRN
  Administered 2017-08-15: 4 mg via INTRAVENOUS

## 2017-08-15 MED ORDER — FENTANYL CITRATE (PF) 100 MCG/2ML IJ SOLN
INTRAMUSCULAR | Status: DC | PRN
Start: 2017-08-15 — End: 2017-08-15
  Administered 2017-08-15 (×6): 50 ug via INTRAVENOUS

## 2017-08-15 MED ORDER — DOCUSATE SODIUM 100 MG PO CAPS
100.0000 mg | ORAL_CAPSULE | Freq: Two times a day (BID) | ORAL | Status: DC
  Administered 2017-08-15 – 2017-08-18 (×6): 100 mg via ORAL
  Filled 2017-08-15 (×6): qty 1

## 2017-08-15 MED ORDER — FAMOTIDINE 20 MG PO TABS
20.0000 mg | ORAL_TABLET | Freq: Every day | ORAL | Status: DC
  Administered 2017-08-15 – 2017-08-17 (×3): 20 mg via ORAL
  Filled 2017-08-15 (×2): qty 1
  Filled 2017-08-15: qty 2

## 2017-08-15 MED ORDER — SODIUM CHLORIDE 0.9% FLUSH
3.0000 mL | Freq: Two times a day (BID) | INTRAVENOUS | Status: DC
  Administered 2017-08-15: 10 mL via INTRAVENOUS
  Administered 2017-08-16 – 2017-08-18 (×4): 3 mL via INTRAVENOUS

## 2017-08-15 MED ORDER — OXYMETAZOLINE HCL 0.05 % NA SOLN: 2.0000 | Freq: Two times a day (BID) | NASAL | Status: DC | PRN

## 2017-08-15 MED ORDER — PROPOFOL 10 MG/ML IV BOLUS
INTRAVENOUS | Status: DC | PRN
  Administered 2017-08-15: 120 mg via INTRAVENOUS

## 2017-08-15 MED ORDER — AMLODIPINE BESYLATE 10 MG PO TABS
10.0000 mg | ORAL_TABLET | Freq: Every day | ORAL | Status: DC
  Administered 2017-08-16 – 2017-08-18 (×3): 10 mg via ORAL
  Filled 2017-08-15 (×4): qty 1

## 2017-08-15 MED ORDER — FUROSEMIDE 20 MG PO TABS
20.0000 mg | ORAL_TABLET | Freq: Every day | ORAL | Status: DC
  Administered 2017-08-16 – 2017-08-18 (×3): 20 mg via ORAL
  Filled 2017-08-15 (×3): qty 1

## 2017-08-15 MED ORDER — ROPIVACAINE HCL 5 MG/ML IJ SOLN: INTRAMUSCULAR | Status: DC | PRN

## 2017-08-15 MED ORDER — 0.9 % SODIUM CHLORIDE (POUR BTL) OPTIME
TOPICAL | Status: DC | PRN
  Administered 2017-08-15: 1000 mL

## 2017-08-15 MED ORDER — VANCOMYCIN HCL IN DEXTROSE 1-5 GM/200ML-% IV SOLN
1000.0000 mg | Freq: Once | INTRAVENOUS | Status: AC
  Administered 2017-08-15: 1000 mg via INTRAVENOUS
  Filled 2017-08-15: qty 200

## 2017-08-15 MED ORDER — PHENOL 1.4 % MT LIQD: 1.0000 | OROMUCOSAL | Status: DC | PRN

## 2017-08-15 MED ORDER — BUPIVACAINE LIPOSOME 1.3 % IJ SUSP
20.0000 mL | INTRAMUSCULAR | Status: AC
Start: 2017-08-15 — End: 2017-08-15
  Administered 2017-08-15: 20 mL
  Filled 2017-08-15: qty 20

## 2017-08-15 MED ORDER — ONDANSETRON HCL 4 MG PO TABS
4.0000 mg | ORAL_TABLET | Freq: Four times a day (QID) | ORAL | Status: DC | PRN
  Administered 2017-08-16: 4 mg via ORAL
  Filled 2017-08-15: qty 1

## 2017-08-15 MED ORDER — ADULT MULTIVITAMIN W/MINERALS CH
1.0000 | ORAL_TABLET | Freq: Every day | ORAL | Status: DC
  Administered 2017-08-16 – 2017-08-18 (×3): 1 via ORAL
  Filled 2017-08-15 (×3): qty 1

## 2017-08-15 MED ORDER — MONTELUKAST SODIUM 10 MG PO TABS
10.0000 mg | ORAL_TABLET | Freq: Every day | ORAL | Status: DC
  Administered 2017-08-15 – 2017-08-17 (×3): 10 mg via ORAL
  Filled 2017-08-15 (×3): qty 1

## 2017-08-15 MED ORDER — ROCURONIUM BROMIDE 100 MG/10ML IV SOLN
INTRAVENOUS | Status: DC | PRN
  Administered 2017-08-15: 50 mg via INTRAVENOUS
  Administered 2017-08-15 (×4): 10 mg via INTRAVENOUS

## 2017-08-15 MED ORDER — EPHEDRINE 5 MG/ML INJ
INTRAVENOUS | Status: AC
Start: 2017-08-15 — End: 2017-08-15
  Filled 2017-08-15: qty 10

## 2017-08-15 MED ORDER — PROMETHAZINE HCL 25 MG/ML IJ SOLN: 6.2500 mg | INTRAMUSCULAR | Status: DC | PRN

## 2017-08-15 MED ORDER — LIDOCAINE HCL (CARDIAC) 20 MG/ML IV SOLN
INTRAVENOUS | Status: DC | PRN
  Administered 2017-08-15: 60 mg via INTRAVENOUS

## 2017-08-15 MED ORDER — FLUTICASONE PROPIONATE 50 MCG/ACT NA SUSP
2.0000 | Freq: Two times a day (BID) | NASAL | Status: DC | PRN
  Filled 2017-08-15: qty 16

## 2017-08-15 MED ORDER — ISOSORBIDE MONONITRATE ER 60 MG PO TB24
90.0000 mg | ORAL_TABLET | Freq: Every morning | ORAL | Status: DC
  Administered 2017-08-16 – 2017-08-18 (×3): 90 mg via ORAL
  Filled 2017-08-15 (×3): qty 1

## 2017-08-15 MED ORDER — LIDOCAINE 2% (20 MG/ML) 5 ML SYRINGE
INTRAMUSCULAR | Status: AC
Start: 2017-08-15 — End: 2017-08-15
  Filled 2017-08-15: qty 5

## 2017-08-15 MED ORDER — MEPERIDINE HCL 25 MG/ML IJ SOLN: 6.2500 mg | INTRAMUSCULAR | Status: DC | PRN

## 2017-08-15 SURGICAL SUPPLY — 74 items
BENZOIN TINCTURE PRP APPL 2/3 (GAUZE/BANDAGES/DRESSINGS) ×3 IMPLANT
BLADE CLIPPER SURG (BLADE) IMPLANT
BONE VIVIGEN FORMABLE 5.4CC (Bone Implant) ×3 IMPLANT
BUR MATCHSTICK NEURO 3.0 LAGG (BURR) ×3 IMPLANT
BUR RND FLUTED 2.5 (BURR) IMPLANT
BUR SABER RD CUTTING 3.0 (BURR) IMPLANT
BUR SABER RD CUTTING 3.0MM (BURR)
CAGE CONCORDE LIFT 11X21 (Cage) ×2 IMPLANT
CAGE CONCORDE LIFT 11X21MM (Cage) ×1 IMPLANT
CLOSURE STERI-STRIP 1/2X4 (GAUZE/BANDAGES/DRESSINGS) ×1
CLSR STERI-STRIP ANTIMIC 1/2X4 (GAUZE/BANDAGES/DRESSINGS) ×2 IMPLANT
COVER BACK TABLE 80X110 HD (DRAPES) ×6 IMPLANT
COVER MAYO STAND STRL (DRAPES) ×3 IMPLANT
COVER SURGICAL LIGHT HANDLE (MISCELLANEOUS) ×3 IMPLANT
DECANTER SPIKE VIAL GLASS SM (MISCELLANEOUS) ×3 IMPLANT
DERMABOND ADVANCED (GAUZE/BANDAGES/DRESSINGS) ×2
DERMABOND ADVANCED .7 DNX12 (GAUZE/BANDAGES/DRESSINGS) ×1 IMPLANT
DRAPE C-ARM 42X72 X-RAY (DRAPES) ×3 IMPLANT
DRAPE C-ARMOR (DRAPES) ×3 IMPLANT
DRAPE POUCH INSTRU U-SHP 10X18 (DRAPES) ×3 IMPLANT
DRAPE SURG 17X23 STRL (DRAPES) ×12 IMPLANT
DRSG MEPILEX BORDER 4X4 (GAUZE/BANDAGES/DRESSINGS) IMPLANT
DRSG MEPILEX BORDER 4X8 (GAUZE/BANDAGES/DRESSINGS) ×3 IMPLANT
DURAPREP 26ML APPLICATOR (WOUND CARE) ×3 IMPLANT
ELECT BLADE 6.5 EXT (BLADE) IMPLANT
ELECT CAUTERY BLADE 6.4 (BLADE) ×3 IMPLANT
ELECT REM PT RETURN 9FT ADLT (ELECTROSURGICAL) ×3
ELECTRODE REM PT RTRN 9FT ADLT (ELECTROSURGICAL) ×1 IMPLANT
EVACUATOR 1/8 PVC DRAIN (DRAIN) IMPLANT
GAUZE SPONGE 4X4 12PLY STRL (GAUZE/BANDAGES/DRESSINGS) ×3 IMPLANT
GLOVE BIOGEL PI IND STRL 8 (GLOVE) ×1 IMPLANT
GLOVE BIOGEL PI INDICATOR 8 (GLOVE) ×2
GLOVE ECLIPSE 9.0 STRL (GLOVE) ×3 IMPLANT
GLOVE ORTHO TXT STRL SZ7.5 (GLOVE) ×3 IMPLANT
GLOVE SURG 8.5 LATEX PF (GLOVE) ×3 IMPLANT
GOWN STRL REUS W/ TWL LRG LVL3 (GOWN DISPOSABLE) ×1 IMPLANT
GOWN STRL REUS W/TWL 2XL LVL3 (GOWN DISPOSABLE) ×6 IMPLANT
GOWN STRL REUS W/TWL LRG LVL3 (GOWN DISPOSABLE) ×2
KIT BASIN OR (CUSTOM PROCEDURE TRAY) ×3 IMPLANT
KIT POSITION SURG JACKSON T1 (MISCELLANEOUS) ×3 IMPLANT
KIT ROOM TURNOVER OR (KITS) ×3 IMPLANT
NEEDLE 22X1 1/2 (OR ONLY) (NEEDLE) ×3 IMPLANT
NEEDLE ASP BONE MRW 11GX15 (NEEDLE) IMPLANT
NEEDLE BONE MARROW 8GAX6 (NEEDLE) IMPLANT
NEEDLE SPNL 18GX3.5 QUINCKE PK (NEEDLE) ×3 IMPLANT
NS IRRIG 1000ML POUR BTL (IV SOLUTION) ×3 IMPLANT
PACK LAMINECTOMY ORTHO (CUSTOM PROCEDURE TRAY) ×3 IMPLANT
PAD ARMBOARD 7.5X6 YLW CONV (MISCELLANEOUS) ×6 IMPLANT
PATTIES SURGICAL .75X.75 (GAUZE/BANDAGES/DRESSINGS) ×3 IMPLANT
PATTIES SURGICAL 1X1 (DISPOSABLE) ×3 IMPLANT
ROD PRE BENT EXP 40MM (Rod) ×6 IMPLANT
SCREW CORT FIX FEN 5.5X7X40MM (Screw) ×6 IMPLANT
SCREW SET SINGLE INNER (Screw) ×12 IMPLANT
SCREW VIPER 7X45MM (Screw) ×6 IMPLANT
SPONGE LAP 18X18 X RAY DECT (DISPOSABLE) ×3 IMPLANT
SPONGE LAP 4X18 X RAY DECT (DISPOSABLE) ×3 IMPLANT
SPONGE SURGIFOAM ABS GEL 100 (HEMOSTASIS) ×3 IMPLANT
SURGIFLO W/THROMBIN 8M KIT (HEMOSTASIS) ×3 IMPLANT
SUT VIC AB 0 CT1 27 (SUTURE) ×2
SUT VIC AB 0 CT1 27XBRD ANBCTR (SUTURE) ×1 IMPLANT
SUT VIC AB 1 CTX 36 (SUTURE) ×4
SUT VIC AB 1 CTX36XBRD ANBCTR (SUTURE) ×2 IMPLANT
SUT VIC AB 2-0 CT1 27 (SUTURE) ×2
SUT VIC AB 2-0 CT1 TAPERPNT 27 (SUTURE) ×1 IMPLANT
SUT VIC AB 3-0 X1 27 (SUTURE) ×3 IMPLANT
SYR 20CC LL (SYRINGE) ×3 IMPLANT
SYR CONTROL 10ML LL (SYRINGE) ×6 IMPLANT
TAP CANN VIPER2 DL 6.0 (TAP) ×3 IMPLANT
TAP CANN VIPER2 DL 7.0 (TAP) ×3 IMPLANT
TOWEL OR 17X24 6PK STRL BLUE (TOWEL DISPOSABLE) ×3 IMPLANT
TOWEL OR 17X26 10 PK STRL BLUE (TOWEL DISPOSABLE) ×3 IMPLANT
TRAY FOLEY W/METER SILVER 16FR (SET/KITS/TRAYS/PACK) ×3 IMPLANT
WATER STERILE IRR 1000ML POUR (IV SOLUTION) IMPLANT
YANKAUER SUCT BULB TIP NO VENT (SUCTIONS) ×3 IMPLANT

## 2017-08-15 NOTE — Anesthesia Postprocedure Evaluation (Signed)
Anesthesia Post Note  Patient: AVYONNA WAGONER  Procedure(s) Performed: Procedure(s) (LRB): Left L4-5 transforaminal lumbar interbody fusion with Depuy cage, rods and screws, local and allograft bone graft, Vivigen; bilateral decompression/partial hemilaminectomy lumbar five-sacral one (Left)     Patient location during evaluation: PACU Anesthesia Type: General Level of consciousness: awake and alert Pain management: pain level controlled Vital Signs Assessment: post-procedure vital signs reviewed and stable Respiratory status: spontaneous breathing, nonlabored ventilation and respiratory function stable Cardiovascular status: blood pressure returned to baseline and stable Postop Assessment: no signs of nausea or vomiting Anesthetic complications: no    Last Vitals:  Vitals:   08/15/17 1502 08/15/17 1514  BP: 116/61 (!) 114/58  Pulse: 78 76  Resp:  14  Temp:    SpO2: 98% 95%    Last Pain:  Vitals:   08/15/17 1345  TempSrc:   PainSc: Asleep                 Lynda Rainwater

## 2017-08-15 NOTE — Anesthesia Preprocedure Evaluation (Signed)
Anesthesia Evaluation    History of Anesthesia Complications (+) PONV  Airway Mallampati: II  TM Distance: >3 FB Neck ROM: Full    Dental no notable dental hx.    Pulmonary asthma ,    Pulmonary exam normal breath sounds clear to auscultation       Cardiovascular hypertension, + CAD and + Past MI  Normal cardiovascular exam Rhythm:Regular Rate:Normal     Neuro/Psych Depression    GI/Hepatic GERD  ,  Endo/Other    Renal/GU      Musculoskeletal  (+) Arthritis ,   Abdominal   Peds  Hematology   Anesthesia Other Findings   Reproductive/Obstetrics                             Anesthesia Physical Anesthesia Plan  ASA: III  Anesthesia Plan: General   Post-op Pain Management:    Induction: Intravenous  PONV Risk Score and Plan: 4 or greater and Ondansetron, Dexamethasone, Midazolam and Treatment may vary due to age or medical condition  Airway Management Planned: Oral ETT  Additional Equipment:   Intra-op Plan:   Post-operative Plan: Extubation in OR  Informed Consent: I have reviewed the patients History and Physical, chart, labs and discussed the procedure including the risks, benefits and alternatives for the proposed anesthesia with the patient or authorized representative who has indicated his/her understanding and acceptance.   Dental advisory given  Plan Discussed with: CRNA  Anesthesia Plan Comments:         Anesthesia Quick Evaluation

## 2017-08-15 NOTE — Transfer of Care (Signed)
Immediate Anesthesia Transfer of Care Note  Patient: Cassie Campbell  Procedure(s) Performed: Procedure(s): Left L4-5 transforaminal lumbar interbody fusion with Depuy cage, rods and screws, local and allograft bone graft, Vivigen; bilateral decompression/partial hemilaminectomy lumbar five-sacral one (Left)  Patient Location: PACU  Anesthesia Type:General  Level of Consciousness: awake, alert , oriented and sedated  Airway & Oxygen Therapy: Patient Spontanous Breathing and Patient connected to nasal cannula oxygen  Post-op Assessment: Report given to RN, Post -op Vital signs reviewed and stable and Patient moving all extremities  Post vital signs: Reviewed and stable  Last Vitals:  Vitals:   08/15/17 0545  BP: (!) 144/77  Pulse: 77  Resp: 18  Temp: 36.6 C  SpO2: 98%    Last Pain:  Vitals:   08/15/17 0620  TempSrc:   PainSc: 8       Patients Stated Pain Goal: 5 (12/75/17 0017)  Complications: No apparent anesthesia complications

## 2017-08-15 NOTE — Progress Notes (Signed)
I discussed and showed Cassie Campbell areas of lateral recess stenosis at the L5-S1 level that are significant and bilateral. I have added on to the consent the decompression of this additional level with bilateral partial hemilaminectomies at the L5-S1 level in addition to the TLIF at L4-5, she understands, the risks and benefits of this surgery remain Unchanged with likelihood of better outcome of the surgery With the additional level decompressed.

## 2017-08-15 NOTE — Brief Op Note (Signed)
08/15/2017  1:14 PM  PATIENT:  Cassie Campbell  76 y.o. 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  PROCEDURE:  Procedure(s): Left L4-5 transforaminal lumbar interbody fusion with Depuy cage, rods and screws, local and allograft bone graft, Vivigen; bilateral decompression/partial hemilaminectomy lumbar five-sacral one (Left)  SURGEON:  Surgeon(s) and Role:    * Jessy Oto, MD - Primary  PHYSICIAN ASSISTANT: Benjiman Core, PA-C  ANESTHESIA:   local and general  EBL:  Total I/O In: 2500 [I.V.:2000; IV Piggyback:500] Out: 410 [Urine:110; Blood:300]  BLOOD ADMINISTERED:0 CC CELLSAVER  DRAINS: Urinary Catheter (Foley)   LOCAL MEDICATIONS USED:  MARCAINE 0.5% 1:1 EXPAREL 1.3% Amount:30 ml  SPECIMEN:  No Specimen  DISPOSITION OF SPECIMEN:  N/A  COUNTS:  YES  TOURNIQUET:  * No tourniquets in log *  DICTATION: .Dragon Dictation  PLAN OF CARE: Admit to inpatient   PATIENT DISPOSITION:  PACU - hemodynamically stable.   Delay start of Pharmacological VTE agent (>24hrs) due to surgical blood loss or risk of bleeding: yes

## 2017-08-15 NOTE — Discharge Instructions (Addendum)
° ° °  Call if there is increasing drainage, fever greater than 101.5, severe head aches, and worsening nausea or light sensitivity. If shortness of breath, bloody cough or chest tightness or pain go to an emergency room. No lifting greater than 10 lbs. Avoid bending, stooping and twisting. Use brace when sitting and out of bed even to go to bathroom. Walk in house for first 2 weeks then may start to get out slowly increasing distances up to one mile by 4-6 weeks post op. After may shower and change dressing following bathing with shower.When bathing remove the brace shower and replace brace before getting out of the shower. If drainage, keep dry dressing and do not bathe the incision, use an moisture impervious dressing. Please call and return for scheduled follow up appointment 2 weeks from the time of surgery. Take iron twice a day with a meal. Take a stool softner as the iron slows the bowel and will darken the stools.

## 2017-08-15 NOTE — Anesthesia Procedure Notes (Signed)
Procedure Name: Intubation Date/Time: 08/15/2017 7:59 AM Performed by: Scheryl Darter Pre-anesthesia Checklist: Patient identified, Emergency Drugs available, Suction available and Patient being monitored Patient Re-evaluated:Patient Re-evaluated prior to induction Oxygen Delivery Method: Circle System Utilized Preoxygenation: Pre-oxygenation with 100% oxygen Induction Type: IV induction Ventilation: Mask ventilation without difficulty Laryngoscope Size: Miller and 2 Grade View: Grade I Tube type: Oral Number of attempts: 1 Airway Equipment and Method: Stylet Placement Confirmation: ETT inserted through vocal cords under direct vision,  positive ETCO2 and breath sounds checked- equal and bilateral Tube secured with: Tape Dental Injury: Teeth and Oropharynx as per pre-operative assessment

## 2017-08-15 NOTE — H&P (Addendum)
Cassie Campbell is an 76 y.o. female.   Chief Complaint: back pain and leg pain HPI: patient with hx of L4-5 stenosis and above complaint presents for surgical intervention.  Failed conservative treatment.  Progressively worsening symptoms.   Past Medical History:  Diagnosis Date  . Allergic rhinitis   . Arthritis   . Asthma    xolair s 8/05 ?11/07; mastered hfa 12/20/08  . Benign positional vertigo   . CAD (coronary artery disease)    a. 09/2014 NSTEMI s/p LHC with sig 2V dz. dLAD diffusely diseased and not suitable for PCI. unsuccessful RCA PCI d/t heavy calcifications  . Depression   . Dyspnea   . GERD (gastroesophageal reflux disease)   . Heart attack (Melbeta)    09/2014  . Hyperlipidemia    <130 ldl pos fm hx, bp  . Hypertension   . Obesity   . Osteopenia    dexa 08/22/07 AP spine + 1.1, left femur -1.3, right femur -.8; dexa 10/06/09 +1.6, left femur =1.6, right femur -.  . PONV (postoperative nausea and vomiting)   . Ruptured disc, cervical   . 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 BREAST SURGERY  10-26-10   Rt. breast bx--for microcalcifications in rt. retroareolar region--dx was hyalinized fibroadenoma  . BUNIONECTOMY Bilateral   . CARDIAC CATHETERIZATION     2015  . [place] [facility]  12/01  . LEFT HEART CATHETERIZATION WITH CORONARY ANGIOGRAM N/A 10/16/2014   Procedure: LEFT HEART CATHETERIZATION WITH CORONARY ANGIOGRAM;  Surgeon: Blane Ohara, MD;  Location: [facility] CATH LAB;  Service: Cardiovascular;  Laterality: N/A;  . PERCUTANEOUS CORONARY ROTOBLATOR INTERVENTION (PCI-R) N/A 10/17/2014   Procedure: PERCUTANEOUS CORONARY ROTOBLATOR INTERVENTION (PCI-R);  Surgeon: Troy Sine, MD;  Location: [facility] CATH LAB;  Service: Cardiovascular;  Laterality: N/A;  . VEIN LIGATION AND STRIPPING    . VIDEO BRONCHOSCOPY Bilateral 02/05/2015   Procedure: VIDEO BRONCHOSCOPY WITHOUT FLUORO;  Surgeon:  Tanda Rockers, MD;  Location: [facility] ENDOSCOPY;  Service: Endoscopy;  Laterality: Bilateral;  . VULVA /PERINEUM BIOPSY  12-30-10   --epidermoid cyst    Family History  Problem Relation Age of Onset  . Diabetes Mother        AODM  . Stroke Mother   . Hypertension Mother   . Heart disease Father   . Diabetes Sister        AODM  . Breast cancer Sister   . Hypertension Sister   . Breast cancer Daughter 36       dec--mets to liver/spine   Social History:  reports that she has never smoked. She has never used smokeless tobacco. She reports that she does not drink alcohol or use drugs.  Allergies:  Allergies  Allergen Reactions  . Fish-Derived Products Shortness Of Breath, Swelling and Rash  . Penicillins Shortness Of Breath, Swelling and Rash    PATIENT HAS HAD A PCN REACTION WITH IMMEDIATE RASH, FACIAL/TONGUE/THROAT SWELLING, SOB, OR LIGHTHEADEDNESS WITH HYPOTENSION:  #  #  #  YES  #  #  #   Has patient had a PCN reaction causing severe rash involving mucus membranes or skin necrosis: No PATIENT HAS HAD A PCN REACTION THAT REQUIRED HOSPITALIZATION:  #  #  #  YES  #  #  #   Has patient had a PCN reaction occurring within the last 10 years: No    . Aspirin Swelling and  Rash    SWELLING REACTION UNSPECIFIED   . Brilinta [Ticagrelor] Rash    Started Brilinta 09/2014 - rash - unsure which it is directly related to  . Codeine Phosphate Nausea Only  . Diphenhydramine Hcl Rash    Medications Prior to Admission  Medication Sig Dispense Refill  . amLODipine (NORVASC) 10 MG tablet TAKE 1 TABLET BY MOUTH EVERY DAY 90 tablet 3  . atorvastatin (LIPITOR) 80 MG tablet TAKE 1 TABLET(80 MG) BY MOUTH DAILY 90 tablet 2  . bisoprolol (ZEBETA) 5 MG tablet Take 1 tablet (5 mg total) by mouth daily. 30 tablet 11  . budesonide-formoterol (SYMBICORT) 160-4.5 MCG/ACT inhaler Inhale 2 puffs into the lungs 2 (two) times daily. 1 Inhaler 0  . cetirizine (ZYRTEC) 10 MG tablet Take 10 mg by mouth at bedtime as  needed (itching and sneezing).     . Cholecalciferol (VITAMIN D3) 2000 units TABS Take 2,000 Units by mouth daily.    . clopidogrel (PLAVIX) 75 MG tablet TAKE 1 TABLET BY MOUTH EVERY DAY 90 tablet 3  . clotrimazole-betamethasone (LOTRISONE) cream Apply 1 application topically as needed (itching). Use as directed for rash    . dextromethorphan-guaiFENesin (MUCINEX DM) 30-600 MG per 12 hr tablet Take 1-2 tablets by mouth every 12 (twelve) hours as needed for cough (with flutter).     . ezetimibe (ZETIA) 10 MG tablet Take 1 tablet (10 mg total) by mouth daily. 30 tablet 6  . famotidine (PEPCID) 20 MG tablet Take 20 mg by mouth at bedtime.     . fluticasone (FLONASE) 50 MCG/ACT nasal spray Place 2 sprays into both nostrils 2 (two) times daily as needed for allergies or rhinitis.     . furosemide (LASIX) 20 MG tablet Take 1 tablet (20 mg total) by mouth daily. 30 tablet 11  . isosorbide mononitrate (IMDUR) 60 MG 24 hr tablet TAKE 1 AND 1/2 TABLETS BY MOUTH EVERY MORNING AND 1/2 TABLET EVERY EVENING 180 tablet 1  . meclizine (ANTIVERT) 25 MG tablet TAKE 2 TABLETS BY MOUTH THREE TIMES DAILY AS NEEDED (Patient taking differently: TAKE 2 TABLETS BY MOUTH THREE TIMES DAILY AS NEEDED FOR DIZZNESS) 24 tablet 0  . montelukast (SINGULAIR) 10 MG tablet TAKE 1 TABLET(10 MG) BY MOUTH AT BEDTIME 30 tablet 0  . Multiple Vitamin (MULTIVITAMIN) capsule Take 1 capsule by mouth daily.     . naproxen sodium (ANAPROX) 220 MG tablet Take 220 mg by mouth daily as needed (PAIN).    Marland Kitchen omeprazole (PRILOSEC) 20 MG capsule Take 20 mg by mouth daily before breakfast.     . RANEXA 500 MG 12 hr tablet TAKE 1 TABLET(500 MG) BY MOUTH TWICE DAILY 60 tablet 5  . HYDROcodone-acetaminophen (NORCO/VICODIN) 5-325 MG tablet Take 1 tablet by mouth every 6 (six) hours as needed for moderate pain. (Patient not taking: Reported on 08/08/2017) 50 tablet 0  . nitroGLYCERIN (NITROSTAT) 0.4 MG SL tablet Place 0.4 mg under the tongue every 5 (five)  minutes as needed for chest pain (may repeat x3).    Marland Kitchen oxymetazoline (AFRIN) 0.05 % nasal spray Place 2 sprays into both nostrils 2 (two) times daily as needed for congestion. as needed for stuffy nose and sinus problems    . PROAIR HFA 108 (90 Base) MCG/ACT inhaler INHALE 1 TO 2 PUFFS BY MOUTH EVERY 4 HOURS AS NEEDED FOR WHEEZING 8.5 g 5  . sodium chloride (OCEAN) 0.65 % nasal spray Place 2 sprays into the nose every 4 (four) hours as needed (  nasal congestion).       No results found for this or any previous visit (from the past 48 hour(s)). No results found.  Review of Systems  Constitutional: Negative.   HENT: Negative.   Eyes: Negative.   Respiratory: Negative.   Cardiovascular: Negative.   Gastrointestinal: Negative.   Genitourinary: Negative.   Musculoskeletal: Positive for back pain.  Skin: Negative.   Neurological: Positive for tingling.  Psychiatric/Behavioral: Negative.     Blood pressure (!) 144/77, pulse 77, temperature 97.8 F (36.6 C), temperature source Oral, resp. rate 18, weight 138 lb (62.6 kg), last menstrual period 12/21/1999, SpO2 98 %. Physical Exam  Constitutional: She is oriented to person, place, and time. No distress.  HENT:  Head: Normocephalic and atraumatic.  Eyes: Pupils are equal, round, and reactive to light. EOM are normal.  Neck: Normal range of motion.  Respiratory: No respiratory distress.  GI: She exhibits no distension.  Musculoskeletal:  Back Exam   Tenderness  The patient is experiencing tenderness in the lumbar.  Range of Motion  Extension: abnormal  Flexion: normal  Lateral Bend Right: abnormal  Lateral Bend Left: abnormal  Rotation Right: abnormal  Rotation Left: abnormal   Muscle Strength  Right Quadriceps:  5/5  Left Quadriceps:  5/5  Right Hamstrings:  5/5  Left Hamstrings:  5/5   Tests  Straight leg raise right: negative Straight leg raise left: negative  Reflexes  Patellar: normal Achilles: normal Biceps:  normal Babinski's sign: normal   Other  Toe Walk: normal Heel Walk: normal Sensation: normal Gait: abnormal  Erythema: no back redness  Neurological: She is alert and oriented to person, place, and time.  Skin: Skin is warm and dry.  Psychiatric: She has a normal mood and affect.     Assessment/Plan L4-5 and L5-S1stenosis, back pain and leg pain  Will proceed with Left L4-5 transforaminal lumbar interbody fusion with Depuy cage, rods and screws, local and allograft bone graft, Vivigen as scheduled. Bilateral partial hemilaminectomies L5-S1 Surgical procedure along with possible risks and complications discussed.  All questions answered and wishes to proceed.  Benjiman Core, PA-C 08/15/2017, 7:12 AM

## 2017-08-15 NOTE — Progress Notes (Signed)
Orthopedic Tech Progress Note Patient Details:  Cassie Campbell 06/27/1941 [id number] Brace completed by bio-[employer]. Patient ID: CERA RORKE, female   DOB: [birth year], 76 y.o.   MRN: [id number]   Braulio Bosch 08/15/2017, 5:07 PM

## 2017-08-15 NOTE — Progress Notes (Signed)
Orthopedic Tech Progress Note Patient Details:  Cassie Campbell [birth year] [id number]  Patient ID: Cassie Campbell, female   DOB: 1941-10-14, 75 y.o.   MRN: [id number]   Hildred Priest 08/15/2017, 4:29 PM Called in [employer]-tech brace order; spoke with Bella Kennedy

## 2017-08-15 NOTE — Interval H&P Note (Signed)
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 After consideration of risks, benefits and other options for treatment, the patient has consented to  Procedure(s): Left L4-5 transforaminal lumbar interbody fusion with Depuy cage, rods and screws, local and allograft bone graft, Vivigen (N/A) as a surgical intervention .  The patient's history has been reviewed, patient examined, no change in status, stable for surgery.  I have reviewed the patient's chart and labs.  Questions were answered to the patient's satisfaction.     Sharyne Richters

## 2017-08-15 NOTE — Progress Notes (Signed)
Pharmacy Antibiotic Note  Cassie Campbell is a 76 y.o. female admitted on 08/15/2017 with surgical prophylaxis.  Pharmacy has been consulted for vancomycin dosing.  Received vancomycin 1g iv x1 at 0755 today  Plan: - vancomycin 1g iv x1 at 2000 today. Will sign off  Weight: 138 lb (62.6 kg)  Temp (24hrs), Avg:97.7 F (36.5 C), Min:97.6 F (36.4 C), Max:97.8 F (36.6 C)   Recent Labs Lab 08/10/17 0947  WBC 7.0  CREATININE 0.68    Estimated Creatinine Clearance: 49.7 mL/min (by C-G formula based on SCr of 0.68 mg/dL).    Allergies  Allergen Reactions  . Fish-Derived Products Shortness Of Breath, Swelling and Rash  . Penicillins Shortness Of Breath, Swelling and Rash    PATIENT HAS HAD A PCN REACTION WITH IMMEDIATE RASH, FACIAL/TONGUE/THROAT SWELLING, SOB, OR LIGHTHEADEDNESS WITH HYPOTENSION:  #  #  #  YES  #  #  #   Has patient had a PCN reaction causing severe rash involving mucus membranes or skin necrosis: No PATIENT HAS HAD A PCN REACTION THAT REQUIRED HOSPITALIZATION:  #  #  #  YES  #  #  #   Has patient had a PCN reaction occurring within the last 10 years: No    . Aspirin Swelling and Rash    SWELLING REACTION UNSPECIFIED   . Brilinta [Ticagrelor] Rash    Started Brilinta 09/2014 - rash - unsure which it is directly related to  . Codeine Phosphate Nausea Only  . Diphenhydramine Hcl Rash    Thank you for allowing pharmacy to be a part of this patient's care.  Kacey Dysert, Tsz-Yin 08/15/2017 3:25 PM

## 2017-08-15 NOTE — Progress Notes (Signed)
Patient arrived to floor, reports feeling nausea, pain of 5 on a 0-10 scale. Incentive spirometer given and educated. Family at bedside. Will continue to monitor.

## 2017-08-15 NOTE — Op Note (Signed)
08/15/2017  8:59 PM  PATIENT:  Cassie Campbell  76 y.o. 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   EBL:300CC  CEL SAVER RETURNED: [place]    COMPONENTS:  Implant Name Type Inv. Item Serial No. Manufacturer Lot No. LRB No. Used  BONE VIVIGEN FORMABLE 5.4CC - [id number] Bone Implant BONE VIVIGEN FORMABLE 5.4CC [id number] [facility]  Left 1  CAGE CONCORDE LIFT 11X21MM - [id number] Cage CAGE CONCORDE LIFT 11X21MM  [employer] [id number] Left 1  SCREW CORT FIX FEN 5.5X7X40MM - [id number] Screw SCREW CORT FIX FEN 5.5X7X40MM  [employer]  Left 2  SCREW VIPER 7X45MM - [id number] Screw SCREW VIPER 7X45MM  [employer]  Left 2  ROD PRE BENT EXP 40MM - [id number] Rod ROD PRE BENT EXP 40MM  [employer]  Left 2  SCREW SET SINGLE INNER - [id number] Screw SCREW SET SINGLE INNER   [employer]   Left 4     PROCEDURE: The patient was met in the holding area, and the appropriate lumbar levels left  L4-5 identified and not marked with an "X" and my initials but was marked at he time of time out. I had discussion with the patient in the preop holding area regarding a change of consent form.The fusion level was reidentified as  L4-5. Patient  understands the rationale to perform TLIF at L4-5                   level to decompress the bilateral L5-S1 lateral recess and foramenal stenosis. The patient was then transported to OR and was placed under general anestheticwithout difficulty. The patient received appropriate preoperative antibiotic prophylaxis clindamycin '900mg'$  IV for multiple antibiotic allergies.  Nursing staff inserted a Foley catheter under sterile conditions. The patient was then turned to a prone position using the [place] spine frame. PAS. all pressure points well padded the arms at the side to 90 90. Standard prep with DuraPrep solution draped in the usual manner from the lower dorsal spine the mid sacral segment. Iodine Vi-Drape was used and the old incision scar was marked. Time-out procedure was called and correct. Skin in the midline between L3 and L5 was then infiltrated with local anesthesia, marcaine 1/2% 1:1 exparel 1.3% total 20 cc used. Incision was then made  extending from L3-L5  through the skin and subcutaneous layers down to the patient's lumbodorsal fascia and spinous processes. The incision then carried sharply excising the supraspinous ligament and then continuing the lateral aspect of the spinous processes of L5, L3 and L4. Cobb elevator used to carefully elevate the paralumbar muscles off of the posterior elements using electrocautery carefully drilled bleeding and perform dissection of the muscle tissues of the preserving the facet capsule at the L3-4. Continuing the exposure out laterally to expose the lateral margin of the  facet joint line at L4-5 and L3-4. Incision was carried in the midline down to the L5 level area bleeders controlled using electrocautery monopolar electrocautery.  C-arm fluoroscopy was then brought into the field and using C-arm fluoroscopy then a hole made into the medial aspect of the pedicle of L4 using a high speed burr observed in the pedicle using C arm at the 5 oclock position on the  left L4 pedicle nerve probe initial entry was determined on fluoroscopy to be good position alignment so that a 4.6m tap was passed to 30 mm within the left L4 pedicle to a depth of nearly 40 mm observed on C-arm fluoroscopy to be beyond the midpoint of the lumbar vertebra and then position alignment within the left L4 pedicle this was then removed and the pedicle channel probed demonstrating patency no sign of rupture the cortex of the pedicle. Tapping with a 4 mm screw tap then 5 mm tap then a 6 mm tap and then a 7.0 mm tap, a 7.0 mm x 40 mm screw was reserved for later placement on the left side pedicle at the L4 level. C-arm fluoroscopy was then brought into the field and using C-arm fluoroscopy then a hole made into the posterior medial aspect of the pedicle of right L4 observed in the pedicle using ball tipped nerve hook and hockey stick nerve probe initial entry was determined on fluoroscopy to be good position alignment so that 4.058mtap was then used to tap the right L4 pedicle to a depth of nearly 45 mm observed on C-arm fluoroscopy to be beyond the midpoint of the lumbar vertebra and then position alignment within the right L4 pedicle this was then removed and the pedicle channel probed demonstrating patency no sign of rupture the cortex of the pedicle. Tapping with a 5 mm screw tap then tapping with a 6.0 mm tap and then a 7.27m38map, a 7.0 mm x 45 mm screw was placed on the right side at the L4 pedicle level. C-arm fluoroscopy was then brought into the field and using C-arm fluoroscopy then a hole made into the posterior and medial aspect of the left pedicle of L5 observed in the pedicle using ball tipped nerve hook and hockey stick nerve probe initial entry was determined on fluoroscopy to be good position alignment so that a 4.0 mm tap was then used to tap the left L5 pedicle to a depth of nearly 40 mm observed on C-arm Ifluoroscopy to be beyond the posterior one third of the lumbar vertebra and good  position alignment within the left L5 pedicle this was then removed and the pedicle channel probed demonstrating patency no sign of rupture the cortex of the pedicle. Tapping with a 4.0 mm screw tap then a 5.0 mm tap then tapping with a 6.0 mm tap and a 7.0 mm tap, a 7.27mm59m40 mm screw was saved for later placement on the left side at the L5 level. The pedicle channel of L5 on the left probed demonstrating patency no sign of rupture the cortex of the pedicle.C-arm fluoroscopy was then brought into the field and using C-arm fluoroscopy then a hole made into the posterior and medial aspect of the right pedicle of L5 observed in the pedicle using ball tipped nerve hook and hockey stick nerve probe initial entry was determined on fluoroscopy to be good position alignment so that a 4.27mm 56m was then used to tap the right L5 pedicle to a depth of nearly 45  mm observed on C-arm fluoroscopy to be beyond the posterior one third of the lumbar vertebra and good position alignment within the right L5 pedicle this was then removed and the pedicle channel probed demonstrating patency no sign of rupture the cortex of the pedicle. Tapping with a 5 mm screw tap, then a 6.0 mm tap and then a 7.0 mm tap then 7.37m x 45 mm screw was placed on the right side at the L5 level. The pedicle channel of L5 on the right probed demonstrating patency no sign of rupture the cortex of the pedicle. Viper screw for fixation of this level was measured as 7.0 mm x 45 mm screw was inserted.  Flow Seal was used for hemostasis of the left L4 and L5 pedicle screw holes. Insight retractor was inserted at the L5-S1 site. Leksell rongeur and osteotomes used to remove a portion of the inferior aspect of the lamina of L5 40% bilaterally. Leksell rongeur was then used to further remove bone down to the ligamentum flavum and the central portions of the lamina. The inferior 50% of the lamina of L5 bilaterally was resected with osteotomes and kerrisons. The  facets were then exposed out laterally and were hypertrophic.Osteotomes and 329mkerrison used to resect medial aspect of the L5-S1 facets bilaterally resecting approximately 10-15% of the facet bilaterally. Kerrisons were then used to resect central portions of the lamina of L5 and the ligamentum flavum at the L5-S1 level The  Inferior lamina of L5 resected centrally preserving at least 7-8 mm with at the pars level L5 bilaterally.  Note that loope magnification and head light was used for this portion the case. Hypertrophic flava was found to be present. Then carefully the left side decompressed  at the left L5 neuroforamen resect bone over the superior lamina of L5 and decompressing the left L5 foramen and reflected portion ligamentum flavum off the medial aspect of the left L5-S1 facet. Hockey-stick nerve probe could be passed out both the L5 and S1 neuroforamen. The medial aspect of facet at the L5-S1 level was then carefully evaluated and hypertrophic ligamentum flavum resected using Kerrisons decompressing the lateral recess on this right side. This was done such that hockey-stick nerve probe could be used to pass the outer recess demonstrating patency and decompression of the L5 nerve root and S1 nerve roots. Attention then turned to the L4-5.  Spinous processe inferior 40% of L4 and superior 15 % of L5 was then resected down to the base the lamina at each segment.  Leksell rongeur used to resect inferior aspect of the lamina on the left side at the L4 level and partially on the right side at L4 The left medial 40% of the facets of L4-5 were resected in order to decompress the left and right side of the lumbar thecal sac at L4-5 and decompress the bilateral  L4 and L5 neuroforamen. Osteotomes and 26m7mnd 3mm56mrrisons were used for this portion of the decompression. Similarly the left side decompression was carried out but near complete facetectomy was perform on the left at L4-5 to provide for exposure of  the left side L4-5 neuroforamen for ease of placement of TLIF (transforaminal lumbar interbody fusion) at the right L4 level inferior portions of the lamina and pars were also resected first beginning with the Leksell rongeur and osteotomes and then resecting using 2 and 3 mm Kerrison. Continued laminectomy was carried out resecting the central portions of the lamina of L4 and upper L5 performing foraminotomies  on the right side at the L4 and L5 levels. The inferior articular process  L4 was resected on the left side.  A large amount of hypertrophic ligmentum flavum was found impressing on the left lateral recesses at L4-5 and narrowing the respective L4 and L5 neuroforamen.  Loupe magnification and headlight were used during this portion procedure.  Attention then turned to placement of the transforaminal lumbar interbody fusion cage.  Bleeding controlled using bipolar electrocautery thrombin soaked gel cottonoids. Then turned to the left L4-5 level the exposure the posterior lateral aspect this was carried out using a Penfield 4 bipolar electrocautery to control small bleeders present. Derricho retractor used to retract the thecal sac and L4 nerve root a 15 blade scalpel was used to incise posterior lateral aspect of the left L4-5 disc the disc space at this level showed a rather severe narrowing posteriorly was more open anteriorly so that an osteotome again was used to resect a small portion the posterior inferior lip of the vertebral body at L4  in order to gain ease of access into the L4-5 disc space. The space was debrided of degenerative disc material using pituitary along root the entire disc space was then debrided of degenerative disc material using pituitary rongeurs curettage down to bleeding bone endplates. 60m and 10 mm shavers were used to debride the disc space and pituitary ronguers used to remove the loosened debris. This space was then carefully assess using spacers  a 10.087mtrial cage provided  the best fit, the Depuy concorde lift lordotic cage 1139m 14m39m chosen so that the permanent 11mm14me by 21 mm Lordotic concorde lift cage packed with local bone graft was placed into the intervertebral disc space. The lift cage idenified with c-arm in good position and alignment. The Lift cage then elevated to nearly 13mm.97m insertion handle then removed and the cage further packed with vivigen II. The posterior intervertebral disc space was then packed with autogenous local bone graft that been harvested from the central laminectomy and Vivigen II bone graft, allograft. Bleeding controlled using bipolar electrocautery.  Observed on C-arm fluoroscopy to be in good position alignment. The cage at L4-5 was placed anteriorly as best as possible the correct patient's lordosis. With this then the transforaminal lumbar interbody fusion portion of the case was completed bleeders were controlled using bipolar electrocautery thrombin-soaked Gelfoam were appropriate.Decortication of the facet joints carried out bilateral L4-5. These were packed with cancellous local bone graft.  The 2 viper corticofixation screws on the left were each placed and then each fastener carefully aligned  to allow for placement of rods. The left side first quarter inch titanium rod was then carefully contoured using the french benders and a precontoured 40 mm rod. This was then placed into the pedicle screws on the left extending from L4-5 each of the caps were carefully placed loosely tightened. Attention turned to the left side were similarly and then screws were carefully adjusted to allow for a better pattern screws to allow for placement of fixation of the rod a quarter inch 40 mm precontoured titanium rod was then carefully contoured. This was able to be inserted into the left pedicle screw fasteners, Caps onto the L4 fasteners were tightened to 80 foot lbs. Across the left side  L4-5 screw fasteners compression was obtained on the  left side between L4 and L5 compressing between the fasteners and tightening the screw caps 85 pounds. Similarly this was done on the right side at L4-5  obtaining compression and tightened 85 pounds. Irrigation was carried out with copious amounts of saline solution this was done throughout the case. Cell Saver was used during the case. With 300 cc blood loss no cell saver blood was returned to the patient. Hockey stick neuroprobe was used to probe the neuroforamen bilateral L5 and L4 these were determined to be well decompressed. Permanent C-arm images were obtained in AP and lateral plane and oblique planes. Remaining local bone graft was then applied along both lateral posterior lateral region extending from L4 to L5 facet beds.Gelfoam was then removed spinal canal. The lumbodorsal musculature carefully exam debrided of any devitalized tissue following removal of self retaining retractors were the bleeders were controlled using electrocautery and the area dorsal lumbar muscle were then approximated in the midline with interrupted #1 Vicryl sutures loose the dorsal fascia was reattached to the spinous process of L3  superiorly and L5  inferiorly this was done with #1 Vicryl sutures. Subcutaneous layers then approximated using interrupted 0 Vicryl sutures and 2-0 Vicryl sutures. Skin was closed with a running subcutaneous stitch of 4-0 Vicryl Dermabond was applied then MedPlex bandage. All instrument and sponge counts were correct. The patient was then returned to a supine position on her bed reactivated extubated and returned to the recovery room in satisfactory condition.   Benjiman Core, PA-C perform the duties of assistant surgeon during this case. He was present from the beginning of the case to the end of the case assisting in transfer the patient from his stretcher to the OR table and back to the stretcher at the end of the case. Assisted in careful retraction and suction of the laminectomy site delicate  neural structures operating under the operating room microscope. He performed closure of the incision from the fascia to the skin applying the dressing.         Basil Dess 08/15/2017, 8:59 PM

## 2017-08-16 DIAGNOSIS — D62 Acute posthemorrhagic anemia: Secondary | ICD-10-CM | POA: Diagnosis not present

## 2017-08-16 LAB — BASIC METABOLIC PANEL
ANION GAP: 9 (ref 5–15)
BUN: <5 mg/dL — ABNORMAL LOW (ref 6–20)
CALCIUM: 8.6 mg/dL — AB (ref 8.9–10.3)
CHLORIDE: 105 mmol/L (ref 101–111)
CO2: 25 mmol/L (ref 22–32)
CREATININE: 0.65 mg/dL (ref 0.44–1.00)
GFR calc non Af Amer: >60 mL/min (ref >60)
Glucose, Bld: 115 mg/dL — ABNORMAL HIGH (ref 65–99)
Potassium: 3.8 mmol/L (ref 3.5–5.1)
SODIUM: 139 mmol/L (ref 135–145)

## 2017-08-16 LAB — CBC
HCT: 26.5 % — ABNORMAL LOW (ref 36.0–46.0)
HEMOGLOBIN: 8.7 g/dL — AB (ref 12.0–15.0)
MCH: 29.1 pg (ref 26.0–34.0)
MCHC: 32.8 g/dL (ref 30.0–36.0)
MCV: 88.6 fL (ref 78.0–100.0)
PLATELETS: 318 10*3/uL (ref 150–400)
RBC: 2.99 MIL/uL — AB (ref 3.87–5.11)
RDW: 14.7 % (ref 11.5–15.5)
WBC: 12.5 10*3/uL — AB (ref 4.0–10.5)

## 2017-08-16 MED ORDER — FERROUS GLUCONATE 324 (38 FE) MG PO TABS
324.0000 mg | ORAL_TABLET | Freq: Two times a day (BID) | ORAL | Status: DC
  Administered 2017-08-16 – 2017-08-18 (×5): 324 mg via ORAL
  Filled 2017-08-16 (×6): qty 1

## 2017-08-16 NOTE — Care Management Note (Addendum)
Case Management Note  Patient Details  Name: Cassie Campbell MRN: [id number] Date of Birth: 09-27-1941  Subjective/Objective:  Pt underwent:    Left L4-5 transforaminal lumbar interbody fusion with Depuy cage, rods and screws, local and allograft bone graft, Vivigen; bilateral decompression/partial hemilaminectomy lumbar five-sacral one. She is from home alone.                Action/Plan: Pt with orders for [facility] services and DME. No f/u per PT, awaiting OT recommendations. CM following.  Expected Discharge Date:                  Expected Discharge Plan:     In-House Referral:     Discharge planning Services     Post Acute Care Choice:    Choice offered to:     DME Arranged:    DME Agency:     HH Arranged:    HH Agency:     Status of Service:  In process, will continue to follow  If discussed at Long Length of Stay Meetings, dates discussed:    Additional Comments:  Pollie Friar, RN 08/16/2017, 10:28 AM

## 2017-08-16 NOTE — Progress Notes (Signed)
     Subjective: 1 Day Post-Op Procedure(s) (LRB): Left L4-5 transforaminal lumbar interbody fusion with Depuy cage, rods and screws, local and allograft bone graft, Vivigen; bilateral decompression/partial hemilaminectomy lumbar five-sacral one (Left) Awake alert and oriented x 4. Pain in the left leg is relieved. Only ache  In my back. Foley discontinued this AM. Some nausea this Past evening post op.  Patient reports pain as mild.    Objective:   VITALS:  Temp:  [97.6 F (36.4 C)-98.3 F (36.8 C)] 98.2 F (36.8 C) (08/28 0530) Pulse Rate:  [71-83] 83 (08/28 0530) Resp:  [7-20] 20 (08/28 0530) BP: (105-122)/(54-65) 118/59 (08/28 0530) SpO2:  [94 %-100 %] 95 % (08/28 0730) Weight:  [138 lb 14.2 oz (63 kg)] 138 lb 14.2 oz (63 kg) (08/27 2100)  Neurologically intact ABD soft Neurovascular intact Sensation intact distally Intact pulses distally Dorsiflexion/Plantar flexion intact Dressing with one or two dry spots <1/4" blood. No saturation. No swelling or induration or fluctuance.   LABS  Recent Labs  08/16/17 0716  HGB 8.7*  WBC 12.5*  PLT 318    Recent Labs  08/16/17 0716  NA 139  K 3.8  CL 105  CO2 25  BUN <5*  CREATININE 0.65  GLUCOSE 115*   No results for input(s): LABPT, INR in the last 72 hours.   Assessment/Plan: 1 Day Post-Op Procedure(s) (LRB): Left L4-5 transforaminal lumbar interbody fusion with Depuy cage, rods and screws, local and allograft bone graft, Vivigen; bilateral decompression/partial hemilaminectomy lumbar five-sacral one (Left) Anemia of blood loss post op. Hgb 8.6  Advance diet Up with therapy  PT and OT, Surveillance anemia. Will being going home with a neighbor and members of church assisting.   Sharyne Richters 08/16/2017, 8:37 AM Patient ID: Cassie Campbell, female   DOB: [birth year], 76 y.o.   MRN: [id number]

## 2017-08-16 NOTE — Therapy (Signed)
Occupational Therapy Evaluation Patient Details Name: CARMISHA LARUSSO MRN: [id number] DOB: May 29, 1941 Today's Date: 08/16/2017    History of Present Illness Patient s/p spinal surgery.   Clinical Impression   Pt reports being independent in all ADLs and IADLs PTA. Pt is active in her church and is still driving within the community. Pt reports neighbors, family, and some church friends are able to provide assistance as needed. Pt currently min assist with functional mobility and ADLs. Pt may benefit from AE to increase independence with ADLs as pt lives alone. OT will follow acutely to address established goals.      Follow Up Recommendations  No OT follow up;Supervision - Intermittent    Equipment Recommendations  None recommended by OT (Pt reports having 3 in 1 at home. )    Recommendations for Other Services       Precautions / Restrictions Precautions Precautions: Back Precaution Booklet Issued: Yes (comment) Precaution Comments: Reviewed back precautions Required Braces or Orthoses: Spinal Brace Spinal Brace: Lumbar corset Restrictions Weight Bearing Restrictions: No      Mobility Bed Mobility Overal bed mobility: Modified Independent             General bed mobility comments: Pt able to verablize log rolling technique. Modified independent for increased time and effort.   Transfers Overall transfer level: Needs assistance   Transfers: Sit to/from Stand Sit to Stand: Min assist         General transfer comment: Min assist to power up. Pt reports stiffness in knees due in being in bed.     Balance Overall balance assessment: Modified Independent                                         ADL either performed or assessed with clinical judgement   ADL Overall ADL's : Needs assistance/impaired     Grooming: Supervision/safety;Set up;Cueing for compensatory techniques;Standing Grooming Details (indicate cue type and reason): Pt  educated on compensatory technique for oral care.  Upper Body Bathing: Set up;Cueing for compensatory techniques;Sitting;Supervision/ safety Upper Body Bathing Details (indicate cue type and reason): Pt reports she plans to sponge bathe for a while as she does not feel safe getting in/out of her tub. Lower Body Bathing: Set up;Minimal assistance;With adaptive equipment;Cueing for back precautions;Adhering to back precautions;Sitting/lateral leans Lower Body Bathing Details (indicate cue type and reason): Pt unable to bring foot over knee for LB bathing. Upper Body Dressing : Set up;Cueing for compensatory techniques;Sitting;Supervision/safety   Lower Body Dressing: Set up;Minimal assistance;Cueing for back precautions;Adhering to back precautions;With adaptive equipment;Sit to/from stand Lower Body Dressing Details (indicate cue type and reason): Pt unable to bring foot over knee for LB dressing.  Toilet Transfer: Minimal assistance;Ambulation;Comfort height toilet Toilet Transfer Details (indicate cue type and reason): Min assist to boost to standing.  Toileting- Clothing Manipulation and Hygiene: Minimal assistance;With adaptive equipment;Adhering to back precautions;Cueing for compensatory techniques;Cueing for back precautions Toileting - Clothing Manipulation Details (indicate cue type and reason): Min assist to boost into standing. May benefit from AWE for hygiene. Tub/ Shower Transfer: Minimal assistance;Adhering to back precautions;Cueing for safety;Ambulation;Tub bench Tub/Shower Transfer Details (indicate cue type and reason): Pt agreeable to sponge bathing initially.  Functional mobility during ADLs: Supervision/safety General ADL Comments: Pt may benefit from AE to increase independence with ADLs.     Vision         Perception  Praxis      Pertinent Vitals/Pain Pain Assessment: No/denies pain     Hand Dominance Left   Extremity/Trunk Assessment Upper Extremity  Assessment Upper Extremity Assessment: Overall WFL for tasks assessed   Lower Extremity Assessment Lower Extremity Assessment: Overall WFL for tasks assessed   Cervical / Trunk Assessment Cervical / Trunk Assessment: Other exceptions Cervical / Trunk Exceptions: s/p back surgery   Communication Communication Communication: No difficulties   Cognition Arousal/Alertness: Awake/alert Behavior During Therapy: WFL for tasks assessed/performed Overall Cognitive Status: Within Functional Limits for tasks assessed                                     General Comments  Pt min assist for sit to stand. Pt reports feeling stiff from laying bed. Pt unable to bring foot over knee and may benefit from AE to promote independence with LB ADLs.      Exercises     Shoulder Instructions      Home Living Family/patient expects to be discharged to:: Private residence Living Arrangements: Alone Available Help at Discharge: Friend(s);Neighbor;Family Type of Home: House Home Access: Stairs to enter [employer] of Steps: 2 Entrance Stairs-Rails: Can reach both Home Layout: One level     Bathroom Shower/Tub: Tub only   Biochemist, clinical: Handicapped height Bathroom Accessibility: Yes How Accessible: Accessible via walker Home Equipment: [place] - 2 wheels;Cane - single point;Bedside commode   Additional Comments: Pt reports she has all DME but has not been using it PTA.      Prior Functioning/Environment Level of Independence: Independent                 OT Problem List: Decreased knowledge of use of DME or AE;Decreased strength      OT Treatment/Interventions: DME and/or AE instruction;Self-care/ADL training;Therapeutic activities;Patient/family education    OT Goals(Current goals can be found in the care plan section) Acute Rehab OT Goals Patient Stated Goal: to go home OT Goal Formulation: With patient Time For Goal Achievement: 08/30/17 Potential to  Achieve Goals: Good ADL Goals Pt Will Perform Lower Body Bathing: with modified independence;with adaptive equipment;sitting/lateral leans Pt Will Perform Lower Body Dressing: with modified independence;with adaptive equipment;sit to/from stand Pt Will Transfer to Toilet: with modified independence;ambulating;bedside commode (AE as needed) Pt Will Perform Tub/Shower Transfer: with modified independence;ambulating (compensatory technique )  OT Frequency: Min 2X/week   Barriers to D/C:            Co-evaluation              AM-PAC PT "6 Clicks" Daily Activity     Outcome Measure Help from another person eating meals?: None Help from another person taking care of personal grooming?: None Help from another person toileting, which includes using toliet, bedpan, or urinal?: A Little Help from another person bathing (including washing, rinsing, drying)?: A Little Help from another person to put on and taking off regular upper body clothing?: None Help from another person to put on and taking off regular lower body clothing?: A Little 6 Click Score: 21   End of Session Equipment Utilized During Treatment: Gait belt Nurse Communication: Mobility status  Activity Tolerance: Patient tolerated treatment well Patient left: in chair;with call bell/phone within reach  OT Visit Diagnosis: Unsteadiness on feet (R26.81);Other (comment) (AE Education )                Time: 3066047119 OT  Time Calculation (min): 14 min Charges:    G-Codes:     Boykin Peek, OTS [phone]   Boykin Peek 08/16/2017, 4:45 PM

## 2017-08-16 NOTE — Progress Notes (Signed)
OT Note - Addendum    08/16/17 1700  OT Visit Information  Last OT Received On 08/16/17  OT Time Calculation  OT Start Time (ACUTE ONLY) 1559  OT Stop Time (ACUTE ONLY) 1613  OT Time Calculation (min) 14 min  [facility], OT/L  [phone] 08/16/2017

## 2017-08-16 NOTE — Evaluation (Signed)
Physical Therapy Evaluation Patient Details Name: Cassie Campbell MRN: [id number] DOB: Oct 21, 1941 Today's Date: 08/16/2017   History of Present Illness  Patient s/p spinal surgery.  Clinical Impression  Patient seen for mobility assessment s/p spinal surgery. Mobilizing well. Educated patient on precautions, mobility expectations, safety and car transfers. No further acute PT needs. Will sign off. Encouraged continued mobility while in hospital.     Follow Up Recommendations No PT follow up;Supervision/Assistance - 24 hour (initially)    Equipment Recommendations  None recommended by PT    Recommendations for Other Services       Precautions / Restrictions Precautions Precautions: Back Precaution Booklet Issued: Yes (comment) Precaution Comments: provided and reviewed Required Braces or Orthoses: Spinal Brace Spinal Brace: Lumbar corset Restrictions Weight Bearing Restrictions: No      Mobility  Bed Mobility Overal bed mobility: Modified Independent             General bed mobility comments: initial instruction provided then patient able to perform all activity without assist.  Transfers Overall transfer level: Modified independent               General transfer comment: increased time to power up, no physical assist required  Ambulation/Gait Ambulation/Gait assistance: Modified independent (Device/Increase time) Ambulation Distance (Feet): 240 Feet Assistive device: None Gait Pattern/deviations: WFL(Within Functional Limits) Gait velocity: decreased Gait velocity interpretation: Below normal speed for age/gender General Gait Details: steady with ambulation, modestly decreased speed  Stairs Stairs: Yes Stairs assistance: Modified independent (Device/Increase time) Stair Management: One rail Left Number of Stairs: 3 General stair comments: no difficulty with performance  Wheelchair Mobility    Modified Rankin (Stroke Patients Only)        Balance Overall balance assessment: Modified Independent                                           Pertinent Vitals/Pain      Home Living Family/patient expects to be discharged to:: Private residence Living Arrangements: Alone   Type of Home: House Home Access: Stairs to enter Entrance Stairs-Rails: Can reach both Entrance Stairs-Number of Steps: 2 Home Layout: One level Home Equipment: Walker - 2 wheels;Cane - single point      Prior Function Level of Independence: Independent               Hand Dominance   Dominant Hand: Left    Extremity/Trunk Assessment   Upper Extremity Assessment Upper Extremity Assessment: Overall WFL for tasks assessed    Lower Extremity Assessment Lower Extremity Assessment: Overall WFL for tasks assessed       Communication   Communication: No difficulties  Cognition Arousal/Alertness: Awake/alert Behavior During Therapy: WFL for tasks assessed/performed Overall Cognitive Status: Within Functional Limits for tasks assessed                                        General Comments General comments (skin integrity, edema, etc.): educated on car transfers, performed stair negotation, spoke about precautions and mobility expectations. patient receptive    Exercises     Assessment/Plan    PT Assessment Patent does not need any further PT services  PT Problem List         PT Treatment Interventions      PT Goals (Current goals  can be found in the Care Plan section)  Acute Rehab PT Goals Patient Stated Goal: to go home PT Goal Formulation: All assessment and education complete, DC therapy    Frequency     Barriers to discharge        Co-evaluation               AM-PAC PT "6 Clicks" Daily Activity  Outcome Measure Difficulty turning over in bed (including adjusting bedclothes, sheets and blankets)?: A Little Difficulty moving from lying on back to sitting on the side of the  bed? : A Little Difficulty sitting down on and standing up from a chair with arms (e.g., wheelchair, bedside commode, etc,.)?: A Little Help needed moving to and from a bed to chair (including a wheelchair)?: A Little Help needed walking in hospital room?: None Help needed climbing 3-5 steps with a railing? : None 6 Click Score: 20    End of Session Equipment Utilized During Treatment: Back brace Activity Tolerance: Patient tolerated treatment well Patient left: in bed;with call bell/phone within reach Nurse Communication: Mobility status PT Visit Diagnosis: Difficulty in walking, not elsewhere classified (R26.2)    Time: 4782-9562 PT Time Calculation (min) (ACUTE ONLY): 21 min   Charges:   PT Evaluation $PT Eval Low Complexity: 1 Low     PT G Codes:        Alben Deeds, PT DPT  Board Certified Neurologic Specialist [place] 08/16/2017, 9:36 AM

## 2017-08-17 LAB — CBC
HCT: 25.9 % — ABNORMAL LOW (ref 36.0–46.0)
Hemoglobin: 8.3 g/dL — ABNORMAL LOW (ref 12.0–15.0)
MCH: 28.6 pg (ref 26.0–34.0)
MCHC: 32 g/dL (ref 30.0–36.0)
MCV: 89.3 fL (ref 78.0–100.0)
PLATELETS: 298 10*3/uL (ref 150–400)
RBC: 2.9 MIL/uL — ABNORMAL LOW (ref 3.87–5.11)
RDW: 14.9 % (ref 11.5–15.5)
WBC: 10.9 10*3/uL — ABNORMAL HIGH (ref 4.0–10.5)

## 2017-08-17 MED FILL — Heparin Sodium (Porcine) Inj 1000 Unit/ML: INTRAMUSCULAR | Qty: 30 | Status: AC

## 2017-08-17 MED FILL — Sodium Chloride IV Soln 0.9%: INTRAVENOUS | Qty: 1000 | Status: AC

## 2017-08-17 NOTE — Progress Notes (Signed)
     Subjective: 2 Days Post-Op Procedure(s) (LRB): Left L4-5 transforaminal lumbar interbody fusion with Depuy cage, rods and screws, local and allograft bone graft, Vivigen; bilateral decompression/partial hemilaminectomy lumbar five-sacral one (Left) Awake,alert and oriented x 4. Pain is controlled, voiding without difficulty. Using incentive spirometry for low grade  Temp now 36.8 normal. No leg pain.  Patient reports pain as mild.    Objective:   VITALS:  Temp:  [97.9 F (36.6 C)-100.4 F (38 C)] 100.4 F (38 C) (08/29 0745) Pulse Rate:  [81-94] 94 (08/29 0745) Resp:  [15-20] 15 (08/29 0745) BP: (104-126)/(51-62) 126/60 (08/29 0745) SpO2:  [92 %-99 %] 92 % (08/29 0751)  Neurologically intact ABD soft Neurovascular intact Sensation intact distally Intact pulses distally Dorsiflexion/Plantar flexion intact Incision: scant drainage New waffle dressing applied. No drainage, no fluctuance.   LABS  Recent Labs  08/16/17 0716 08/17/17 0640  HGB 8.7* 8.3*  WBC 12.5* 10.9*  PLT 318 298    Recent Labs  08/16/17 0716  NA 139  K 3.8  CL 105  CO2 25  BUN <5*  CREATININE 0.65  GLUCOSE 115*   No results for input(s): LABPT, INR in the last 72 hours.   Assessment/Plan: 2 Days Post-Op Procedure(s) (LRB): Left L4-5 transforaminal lumbar interbody fusion with Depuy cage, rods and screws, local and allograft bone graft, Vivigen; bilateral decompression/partial hemilaminectomy lumbar five-sacral one (Left)  Anemia post surgical blood loss.  Advance diet Up with therapy Plan for discharge tomorrow if she continues to make progress with PT/OT. Lives alone so need to be sure of her independence prior to discharge.  Sharyne Richters 08/17/2017, 8:41 AM Patient ID: Cassie Campbell, female   DOB: [birth year], 76 y.o.   MRN: [id number]

## 2017-08-17 NOTE — Care Management Note (Signed)
Case Management Note  Patient Details  Name: Cassie Campbell MRN: [id number] Date of Birth: 11/27/1941  Subjective/Objective:                    Action/Plan: Plan is for patient to d/c home tomorrow with HH services. CM met with the patient and provided her a list of HH agencies. She selected [facility]. [name] with [facility] notified and accepted the referral.  Pt with orders for walker and 3 in 1. Pt states she already has this DME at home.    Expected Discharge Date:                  Expected Discharge Plan:  Home w Home Health Services  In-House Referral:     Discharge planning Services  CM Consult  Post Acute Care Choice:  Durable Medical Equipment, Home Health (pt states she has all needed DME) Choice offered to:     DME Arranged:    DME Agency:     HH Arranged:  PT, OT HH Agency:  [facility] (now [facility])  Status of Service:  Completed, signed off  If discussed at Long Length of Stay Meetings, dates discussed:    Additional Comments:  Kelli F Willard, RN 08/17/2017, 11:27 AM  

## 2017-08-17 NOTE — Progress Notes (Signed)
Temp of 100.1. Med administered, Incentive spirometer encouraged(1250 achieved on incentive spirometer) and patient is encouraged to sit up in the chair. Will continue to moniotr

## 2017-08-17 NOTE — Progress Notes (Signed)
Patient walked about 140ft with staff. Will continue to monitor

## 2017-08-17 NOTE — Therapy (Signed)
Occupational Therapy Treatment and Discharge Patient Details Name: Cassie Campbell MRN: [id number] DOB: 1941-01-28 Today's Date: 08/17/2017    History of present illness Pt is a 76 y.o. female s/p  Left L4-5 transforaminal lumbar interbody fusion and bilateral decompression/partial hemilaminectomy lumbar five-sacral one (Left). PMH: arthritis, CAD, heart attack, HTN, and hyperlipidemia.    OT comments  Focus of today's session on education for use of AE to complete ADLs with increased indpendence and adherence to back precautions as pt is returning home alone. Pt able to return demonstrate safe use of AE to complete ADLs. Pt agreeable to sponge bathe initially until precautions are cleared by the doctor. Pt has met all acute OT goals and safe to d/c. OT signing off.    Follow Up Recommendations  No OT follow up;Supervision - Intermittent    Equipment Recommendations  None recommended by OT    Recommendations for Other Services      Precautions / Restrictions Precautions Precautions: Back Precaution Booklet Issued: Yes (comment) Precaution Comments: Pt able to verbalize 3/3 precautions Required Braces or Orthoses: Spinal Brace Spinal Brace: Lumbar corset Restrictions Weight Bearing Restrictions: No       Mobility Bed Mobility Overal bed mobility: Modified Independent             General bed mobility comments: Pt able to verablize log rolling technique. Modified independent for increased time and effort.   Transfers Overall transfer level: Needs assistance   Transfers: Sit to/from Stand Sit to Stand: Min guard         General transfer comment: Min guard for safety    Balance Overall balance assessment: Modified Independent                                         ADL either performed or assessed with clinical judgement   ADL Overall ADL's : Needs assistance/impaired             Lower Body Bathing: Set up;Supervison/ safety;Cueing for  compensatory techniques;Cueing for back precautions;Sitting/lateral leans Lower Body Bathing Details (indicate cue type and reason): Pt educated on use of long handle sponge to complete LB bathing. Pt agreeable to sponge bathing as she only has bath tub at home.      Lower Body Dressing: Supervision/safety;Set up;Cueing for safety;Cueing for compensatory techniques;Cueing for back precautions;Sitting/lateral leans Lower Body Dressing Details (indicate cue type and reason): Pt educated on use of sock aide and reacher to don LB clothing. Pt able to don both socks using sock aide.     Toileting- Clothing Manipulation and Hygiene: Supervision/safety;Set up;Cueing for compensatory techniques;Cueing for back precautions;Sit to/from stand Toileting - Clothing Manipulation Details (indicate cue type and reason): Pt educated on use of tongs to complete hygiene after BM adhering to back precautions. Tub/ Shower Transfer: Supervision/safety;Set up Tub/Shower Transfer Details (indicate cue type and reason): Pt agreeable to sponge bathe initially as pt only has tub at home.  Functional mobility during ADLs: Supervision/safety General ADL Comments: Pt educated on use of AE to complete ADLs with increased indpendence and adherence to back precautions.      Vision       Perception     Praxis      Cognition Arousal/Alertness: Awake/alert Behavior During Therapy: WFL for tasks assessed/performed Overall Cognitive Status: Within Functional Limits for tasks assessed  Exercises     Shoulder Instructions       General Comments Pt able to demonstrate use of AE for ADLs while adering to back precautions.     Pertinent Vitals/ Pain       Pain Assessment: No/denies pain  Home Living                                          Prior Functioning/Environment              Frequency  Min 2X/week        Progress Toward  Goals  OT Goals(current goals can now be found in the care plan section)  Progress towards OT goals: Progressing toward goals  Acute Rehab OT Goals Patient Stated Goal: to go home OT Goal Formulation: With patient Time For Goal Achievement: 08/30/17 Potential to Achieve Goals: Good  Plan Discharge plan remains appropriate    Co-evaluation                 AM-PAC PT "6 Clicks" Daily Activity     Outcome Measure   Help from another person eating meals?: None Help from another person taking care of personal grooming?: None Help from another person toileting, which includes using toliet, bedpan, or urinal?: A Little Help from another person bathing (including washing, rinsing, drying)?: A Little Help from another person to put on and taking off regular upper body clothing?: None Help from another person to put on and taking off regular lower body clothing?: A Little 6 Click Score: 21    End of Session Equipment Utilized During Treatment: Gait belt  OT Visit Diagnosis: Unsteadiness on feet (R26.81);Other (comment)   Activity Tolerance Patient tolerated treatment well   Patient Left in bed;with call bell/phone within reach   Nurse Communication Mobility status        Time: 7867-6720 OT Time Calculation (min): 11 min  Charges: OT General Charges $OT Visit: 1 Visit OT Treatments $Self Care/Home Management : 8-22 mins  Boykin Peek, [place] #[phone]   Boykin Peek 08/17/2017, 2:52 PM

## 2017-08-18 ENCOUNTER — Other Ambulatory Visit (INDEPENDENT_AMBULATORY_CARE_PROVIDER_SITE_OTHER): Payer: Self-pay | Admitting: Specialist

## 2017-08-18 ENCOUNTER — Other Ambulatory Visit: Payer: Self-pay | Admitting: Internal Medicine

## 2017-08-18 LAB — CBC
HEMATOCRIT: 26.7 % — AB (ref 36.0–46.0)
Hemoglobin: 8.9 g/dL — ABNORMAL LOW (ref 12.0–15.0)
MCH: 29.4 pg (ref 26.0–34.0)
MCHC: 33.3 g/dL (ref 30.0–36.0)
MCV: 88.1 fL (ref 78.0–100.0)
Platelets: 343 10*3/uL (ref 150–400)
RBC: 3.03 MIL/uL — ABNORMAL LOW (ref 3.87–5.11)
RDW: 15.2 % (ref 11.5–15.5)
WBC: 8.5 10*3/uL (ref 4.0–10.5)

## 2017-08-18 MED ORDER — DOCUSATE SODIUM 100 MG PO CAPS: 100.0000 mg | ORAL_CAPSULE | Freq: Two times a day (BID) | ORAL | 1 refills | Status: DC

## 2017-08-18 MED ORDER — METHOCARBAMOL 500 MG PO TABS: 500.0000 mg | ORAL_TABLET | Freq: Three times a day (TID) | ORAL | 1 refills | Status: DC | PRN

## 2017-08-18 MED ORDER — CELECOXIB 200 MG PO CAPS: 200.0000 mg | ORAL_CAPSULE | Freq: Two times a day (BID) | ORAL | 1 refills | Status: DC

## 2017-08-18 MED ORDER — HYDROCODONE-ACETAMINOPHEN 5-325 MG PO TABS: 1.0000 | ORAL_TABLET | Freq: Four times a day (QID) | ORAL | 0 refills | Status: DC | PRN

## 2017-08-18 MED ORDER — FERROUS GLUCONATE 324 (38 FE) MG PO TABS: 324.0000 mg | ORAL_TABLET | Freq: Two times a day (BID) | ORAL | 1 refills | Status: DC

## 2017-08-18 MED ORDER — CHOLECALCIFEROL 50 MCG (2000 UT) PO TABS: 2000.0000 [IU] | ORAL_TABLET | Freq: Every day | ORAL | 3 refills | Status: AC

## 2017-08-18 NOTE — Telephone Encounter (Signed)
Celecoxib refill request  

## 2017-08-18 NOTE — Progress Notes (Signed)
     Subjective: 3 Days Post-Op Procedure(s) (LRB): Left L4-5 transforaminal lumbar interbody fusion with Depuy cage, rods and screws, local and allograft bone graft, Vivigen; bilateral decompression/partial hemilaminectomy lumbar five-sacral one (Left) Awake, alert. I want to take a shower, I can go home and be with my neighbor until I am more independent. Patient reports pain as mild.    Objective:   VITALS:  Temp:  [98 F (36.7 C)-100.1 F (37.8 C)] 98 F (36.7 C) (08/30 0512) Pulse Rate:  [71-97] 82 (08/30 0512) Resp:  [16-20] 18 (08/30 0512) BP: (94-132)/(55-64) 132/62 (08/30 0512) SpO2:  [92 %-95 %] 95 % (08/30 0512)  Neurologically intact ABD soft Neurovascular intact Sensation intact distally Intact pulses distally Dorsiflexion/Plantar flexion intact Incision: no drainage No cellulitis present   LABS  Recent Labs  08/16/17 0716 08/17/17 0640  HGB 8.7* 8.3*  WBC 12.5* 10.9*  PLT 318 298    Recent Labs  08/16/17 0716  NA 139  K 3.8  CL 105  CO2 25  BUN <5*  CREATININE 0.65  GLUCOSE 115*   No results for input(s): LABPT, INR in the last 72 hours.   Assessment/Plan: 3 Days Post-Op Procedure(s) (LRB): Left L4-5 transforaminal lumbar interbody fusion with Depuy cage, rods and screws, local and allograft bone graft, Vivigen; bilateral decompression/partial hemilaminectomy lumbar five-sacral one (Left) Anemia of post op blood loss, stable.  Advance diet Up with therapy Discharge home with home health  Sharyne Richters 08/18/2017, 8:31 AM Patient ID: Cassie Campbell, female   DOB: 1941-12-07, 76 y.o.   MRN: [id number]

## 2017-08-18 NOTE — Progress Notes (Signed)
Pt discharge education and instructions completed with pt and friend at bedside; both voices understanding and denies any questions. Pt IV removed; pt back incision remains unremarkable and intact. Pt handed her prescription for Norco and to pick up electronically sent prescriptions from preferred pharmacy on file. Pt discharge home with friend to transport her home. Pt transported off unit via wheelchair with belongings and friend to the side. Delia Heady RN

## 2017-08-18 NOTE — Discharge Summary (Signed)
Physician Discharge Summary      Patient ID: Cassie Campbell MRN: [id number] DOB/AGE: Sep 12, 1941 76 y.o.  Admit date: 08/15/2017 Discharge date: 08/18/2017  Admission Diagnoses:  Active Problems:   Postoperative anemia due to acute blood loss   S/P lumbar fusion   Discharge Diagnoses:  Same  Past Medical History:  Diagnosis Date  . Allergic rhinitis   . Arthritis   . Asthma    xolair s 8/05 ?11/07; mastered hfa 12/20/08  . Benign positional vertigo   . CAD (coronary artery disease)    a. 09/2014 NSTEMI s/p LHC with sig 2V dz. dLAD diffusely diseased and not suitable for PCI. unsuccessful RCA PCI d/t heavy calcifications  . Depression   . Dyspnea   . GERD (gastroesophageal reflux disease)   . Heart attack ([place])    09/2014  . Hyperlipidemia    <130 ldl pos fm hx, bp  . Hypertension   . Obesity   . Osteopenia    dexa 08/22/07 AP spine + 1.1, left femur -1.3, right femur -.8; dexa 10/06/09 +1.6, left femur =1.6, right femur -.  . PONV (postoperative nausea and vomiting)   . Ruptured disc, cervical   . 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 female who was admitted 08/15/2017 with a chief complaint of No chief complaint on file. , and found to have a diagnosis of Spondylolisthesis, lumbar region.  She was brought to the operating room on 08/15/2017 and underwent the above named procedures.    She was given perioperative antibiotics:  Anti-infectives    Start     Dose/Rate Route Frequency Ordered Stop   08/15/17 2000  vancomycin (VANCOCIN) IVPB 1000 mg/200 mL premix     1,000 mg 200 mL/hr over 60 Minutes Intravenous   Once 08/15/17 1527 08/15/17 2256   08/15/17 0606  vancomycin (VANCOCIN) IVPB 1000 mg/200 mL premix     1,000 mg 200 mL/hr over 60 Minutes Intravenous On call to O.R. 08/15/17 0606 08/15/17 5462    She recovered in the PACU and was transferred to Neurosurgery floor [facility] [address]. Experienced some nausea post op and this gradually resolved. IVF maintained and she had normal VS. Tolerated IV and po Pain medications. POD#1 foley discontinued and she was fitted with a LSO. Able to tolerate po nourishment and liquids. Hbg 8.7. PT and OT evaluated and started progressive ambulation.POD#2 low grade fever responded to incentive spirometry, Hgb 8.3, started on iron and PT and OT progressed, dressing changed with no sign of infection or drainage. POD#3 dressing remained dry. Hgb 8.9, IVF had been discontinued.  She was able to shower with assistance. Continued with brace, her preoperative leg pain was resolved. Her VSS. She was discharged home on POD#3.    She was given sequential compression devices, early ambulation, and chemoprophylaxis for DVT prophylaxis.  She benefited maximally from their hospital stay and there were no complications.    Recent vital signs:  Vitals:   08/18/17 0930 08/18/17 1335  BP:  (!) 109/59  Pulse:  89  Resp:  20  Temp:  99.3 F (37.4 C)  SpO2: 93% 94%    Recent laboratory studies:  Results for orders placed or  performed during the hospital encounter of 53/97/67  Basic Metabolic Panel  Result Value Ref Range   Sodium 139 135 - 145 mmol/L   Potassium 3.8 3.5 - 5.1 mmol/L   Chloride 105 101 - 111 mmol/L   CO2 25 22 - 32 mmol/L   Glucose, Bld 115 (H) 65 - 99 mg/dL   BUN <5 (L) 6 - 20 mg/dL   Creatinine, Ser 0.65 0.44 - 1.00 mg/dL   Calcium 8.6 (L) 8.9 - 10.3 mg/dL   GFR calc non Af Amer >60 >60 mL/min   GFR calc Af Amer >60 >60 mL/min   Anion gap 9 5 - 15  CBC  Result Value Ref Range   WBC 12.5 (H) 4.0 - 10.5 K/uL   RBC 2.99 (L) 3.87 - 5.11 MIL/uL    Hemoglobin 8.7 (L) 12.0 - 15.0 g/dL   HCT 26.5 (L) 36.0 - 46.0 %   MCV 88.6 78.0 - 100.0 fL   MCH 29.1 26.0 - 34.0 pg   MCHC 32.8 30.0 - 36.0 g/dL   RDW 14.7 11.5 - 15.5 %   Platelets 318 150 - 400 K/uL  CBC  Result Value Ref Range   WBC 10.9 (H) 4.0 - 10.5 K/uL   RBC 2.90 (L) 3.87 - 5.11 MIL/uL   Hemoglobin 8.3 (L) 12.0 - 15.0 g/dL   HCT 25.9 (L) 36.0 - 46.0 %   MCV 89.3 78.0 - 100.0 fL   MCH 28.6 26.0 - 34.0 pg   MCHC 32.0 30.0 - 36.0 g/dL   RDW 14.9 11.5 - 15.5 %   Platelets 298 150 - 400 K/uL  CBC  Result Value Ref Range   WBC 8.5 4.0 - 10.5 K/uL   RBC 3.03 (L) 3.87 - 5.11 MIL/uL   Hemoglobin 8.9 (L) 12.0 - 15.0 g/dL   HCT 26.7 (L) 36.0 - 46.0 %   MCV 88.1 78.0 - 100.0 fL   MCH 29.4 26.0 - 34.0 pg   MCHC 33.3 30.0 - 36.0 g/dL   RDW 15.2 11.5 - 15.5 %   Platelets 343 150 - 400 K/uL    Discharge Medications:   Allergies as of 08/18/2017      Reactions   Fish-derived Products Shortness Of Breath, Swelling, Rash   Penicillins Shortness Of Breath, Swelling, Rash   PATIENT HAS HAD A PCN REACTION WITH IMMEDIATE RASH, FACIAL/TONGUE/THROAT SWELLING, SOB, OR LIGHTHEADEDNESS WITH HYPOTENSION:  #  #  #  YES  #  #  #   Has patient had a PCN reaction causing severe rash involving mucus membranes or skin necrosis: No PATIENT HAS HAD A PCN REACTION THAT REQUIRED HOSPITALIZATION:  #  #  #  YES  #  #  #   Has patient had a PCN reaction occurring within the last 10 years: No   Aspirin Swelling, Rash   SWELLING REACTION UNSPECIFIED    Brilinta [ticagrelor] Rash   Started Brilinta 09/2014 - rash - unsure which it is directly related to   Codeine Phosphate Nausea Only   Diphenhydramine Hcl Rash      Medication List    STOP taking these medications   naproxen sodium 220 MG tablet Commonly known as:  ANAPROX     TAKE these medications   amLODipine 10 MG tablet Commonly known as:  NORVASC TAKE 1 TABLET BY MOUTH EVERY DAY   atorvastatin 80 MG tablet Commonly known as:   LIPITOR TAKE 1 TABLET(80 MG) BY MOUTH DAILY   bisoprolol 5 MG  tablet Commonly known as:  ZEBETA Take 1 tablet (5 mg total) by mouth daily.   budesonide-formoterol 160-4.5 MCG/ACT inhaler Commonly known as:  SYMBICORT Inhale 2 puffs into the lungs 2 (two) times daily.   cetirizine 10 MG tablet Commonly known as:  ZYRTEC Take 10 mg by mouth at bedtime as needed (itching and sneezing).   clopidogrel 75 MG tablet Commonly known as:  PLAVIX TAKE 1 TABLET BY MOUTH EVERY DAY   clotrimazole-betamethasone cream Commonly known as:  LOTRISONE Apply 1 application topically as needed (itching). Use as directed for rash   dextromethorphan-guaiFENesin 30-600 MG 12hr tablet Commonly known as:  MUCINEX DM Take 1-2 tablets by mouth every 12 (twelve) hours as needed for cough (with flutter).   docusate sodium 100 MG capsule Commonly known as:  COLACE Take 1 capsule (100 mg total) by mouth 2 (two) times daily.   ezetimibe 10 MG tablet Commonly known as:  ZETIA Take 1 tablet (10 mg total) by mouth daily.   famotidine 20 MG tablet Commonly known as:  PEPCID Take 20 mg by mouth at bedtime.   ferrous gluconate 324 MG tablet Commonly known as:  FERGON Take 1 tablet (324 mg total) by mouth 2 (two) times daily with a meal.   fluticasone 50 MCG/ACT nasal spray Commonly known as:  FLONASE Place 2 sprays into both nostrils 2 (two) times daily as needed for allergies or rhinitis.   furosemide 20 MG tablet Commonly known as:  LASIX Take 1 tablet (20 mg total) by mouth daily.   HYDROcodone-acetaminophen 5-325 MG tablet Commonly known as:  NORCO/VICODIN Take 1 tablet by mouth every 6 (six) hours as needed for moderate pain. What changed:  Another medication with the same name was added. Make sure you understand how and when to take each.   HYDROcodone-acetaminophen 5-325 MG tablet Commonly known as:  NORCO/VICODIN Take 1-2 tablets by mouth every 6 (six) hours as needed for severe pain  (breakthrough pain). What changed:  You were already taking a medication with the same name, and this prescription was added. Make sure you understand how and when to take each.   isosorbide mononitrate 60 MG 24 hr tablet Commonly known as:  IMDUR TAKE 1 AND 1/2 TABLETS BY MOUTH EVERY MORNING AND 1/2 TABLET EVERY EVENING   meclizine 25 MG tablet Commonly known as:  ANTIVERT TAKE 2 TABLETS BY MOUTH THREE TIMES DAILY AS NEEDED What changed:  See the new instructions.   methocarbamol 500 MG tablet Commonly known as:  ROBAXIN Take 1 tablet (500 mg total) by mouth every 8 (eight) hours as needed for muscle spasms.   multivitamin capsule Take 1 capsule by mouth daily.   nitroGLYCERIN 0.4 MG SL tablet Commonly known as:  NITROSTAT Place 0.4 mg under the tongue every 5 (five) minutes as needed for chest pain (may repeat x3).   omeprazole 20 MG capsule Commonly known as:  PRILOSEC Take 20 mg by mouth daily before breakfast.   oxymetazoline 0.05 % nasal spray Commonly known as:  AFRIN Place 2 sprays into both nostrils 2 (two) times daily as needed for congestion. as needed for stuffy nose and sinus problems   PROAIR HFA 108 (90 Base) MCG/ACT inhaler Generic drug:  albuterol INHALE 1 TO 2 PUFFS BY MOUTH EVERY 4 HOURS AS NEEDED FOR WHEEZING   RANEXA 500 MG 12 hr tablet Generic drug:  ranolazine TAKE 1 TABLET(500 MG) BY MOUTH TWICE DAILY   sodium chloride 0.65 % nasal spray Commonly known as:  OCEAN Place 2 sprays into the nose every 4 (four) hours as needed (nasal congestion).   Vitamin D3 2000 units Tabs Take 2,000 Units by mouth daily. What changed:  Another medication with the same name was added. Make sure you understand how and when to take each.   Cholecalciferol 2000 units Tabs Take 1 tablet (2,000 Units total) by mouth daily. What changed:  You were already taking a medication with the same name, and this prescription was added. Make sure you understand how and when to  take each.            Discharge Care Instructions        Start     Ordered   08/18/17 0000  cholecalciferol 2000 units TABS  Daily     08/18/17 0839   08/18/17 0000  docusate sodium (COLACE) 100 MG capsule  2 times daily     08/18/17 0839   08/18/17 0000  ferrous gluconate (FERGON) 324 MG tablet  2 times daily with meals     08/18/17 0839   08/18/17 0000  HYDROcodone-acetaminophen (NORCO/VICODIN) 5-325 MG tablet  Every 6 hours PRN     08/18/17 0839   08/18/17 0000  methocarbamol (ROBAXIN) 500 MG tablet  Every 8 hours PRN     08/18/17 0839   08/18/17 0000  Call MD / Call 911    Comments:  If you experience chest pain or shortness of breath, CALL 911 and be transported to the hospital emergency room.  If you develope a fever above 101 F, pus (white drainage) or increased drainage or redness at the wound, or calf pain, call your surgeon's office.   08/18/17 0839   08/18/17 0000  Diet - low sodium heart healthy     08/18/17 0839   08/18/17 0000  Constipation Prevention    Comments:  Drink plenty of fluids.  Prune juice may be helpful.  You may use a stool softener, such as Colace (over the counter) 100 mg twice a day.  Use MiraLax (over the counter) for constipation as needed.   08/18/17 0839   08/18/17 0000  Increase activity slowly as tolerated     08/18/17 0839   08/18/17 0000  Discharge instructions    Comments:  Call if there is increasing drainage, fever greater than 101.5, severe head aches, and worsening nausea or light sensitivity. If shortness of breath, bloody cough or chest tightness or pain go to an emergency room. No lifting greater than 10 lbs. Avoid bending, stooping and twisting. Use brace when sitting and out of bed even to go to bathroom. Walk in house for first 2 weeks then may start to get out slowly increasing distances up to one mile by 4-6 weeks post op. After may shower and change dressing following bathing with shower.When bathing remove the brace shower  and replace brace before getting out of the shower. If drainage, keep dry dressing and do not bathe the incision, use an moisture impervious dressing. Please call and return for scheduled follow up appointment 2 weeks from the time of surgery. Take iron twice a day with a meal. Take a stool softner as the iron slows the bowel and will darken the stools.   08/18/17 0839   08/18/17 0000  Driving restrictions    Comments:  No driving for 2 weeks and when you are no longer taking a muscle relaxer or narcotics.   08/18/17 0839   08/18/17 0000  Lifting restrictions    Comments:  No lifting for 12 weeks   08/18/17 0839      Diagnostic Studies: Dg Chest 2 View  Result Date: 07/26/2017 CLINICAL DATA:  Asthma.  Hypertension. EXAM: CHEST  2 VIEW COMPARISON:  Chest radiograph June 10, 2016 and chest CT June 11, 2016 FINDINGS: There are atelectatic changes in both lower lung zones. There is no edema or consolidation. The heart size and pulmonary vascularity are normal. No adenopathy. There is stable mild anterior wedging of a midthoracic vertebral body. There is postoperative change in the lower cervical region. There are foci of calcification in the left anterior descending and right coronary arteries. IMPRESSION: Lower lobe atelectatic change bilaterally. No edema or consolidation. There are foci of coronary artery calcification noted. Electronically Signed   By: Lowella Grip III M.D.   On: 07/26/2017 14:20   Dg Lumbar Spine Complete  Result Date: 08/15/2017 CLINICAL DATA:  Lumbar spine surgery. EXAM: DG C-ARM 61-120 MIN; LUMBAR SPINE - COMPLETE 4+ VIEW COMPARISON:  MRI 09/03/2016. FINDINGS: Lumbar spine numbered as per prior MRI. Patient status post L4-L5 posterior interbody fusion. Posterior pedicle tips appear to course into the superior endplates of L5. Lower lumbar laminectomy defects. None images obtained. 1 minutes 50 seconds fluoroscopy time. IMPRESSION: Postsurgical changes lumbar spine .  Electronically Signed   By: Marcello Moores  Register   On: 08/15/2017 13:24   Dg C-arm 61-120 Min  Result Date: 08/15/2017 CLINICAL DATA:  Lumbar spine surgery. EXAM: DG C-ARM 61-120 MIN; LUMBAR SPINE - COMPLETE 4+ VIEW COMPARISON:  MRI 09/03/2016. FINDINGS: Lumbar spine numbered as per prior MRI. Patient status post L4-L5 posterior interbody fusion. Posterior pedicle tips appear to course into the superior endplates of L5. Lower lumbar laminectomy defects. None images obtained. 1 minutes 50 seconds fluoroscopy time. IMPRESSION: Postsurgical changes lumbar spine . Electronically Signed   By: Marcello Moores  Register   On: 08/15/2017 13:24    Disposition: 01-Home or Self Care  Discharge Instructions    Call MD / Call 911    Complete by:  As directed    If you experience chest pain or shortness of breath, CALL 911 and be transported to the hospital emergency room.  If you develope a fever above 101 F, pus (white drainage) or increased drainage or redness at the wound, or calf pain, call your surgeon's office.   Constipation Prevention    Complete by:  As directed    Drink plenty of fluids.  Prune juice may be helpful.  You may use a stool softener, such as Colace (over the counter) 100 mg twice a day.  Use MiraLax (over the counter) for constipation as needed.   Diet - low sodium heart healthy    Complete by:  As directed    Discharge instructions    Complete by:  As directed    Call if there is increasing drainage, fever greater than 101.5, severe head aches, and worsening nausea or light sensitivity. If shortness of breath, bloody cough or chest tightness or pain go to an emergency room. No lifting greater than 10 lbs. Avoid bending, stooping and twisting. Use brace when sitting and out of bed even to go to bathroom. Walk in house for first 2 weeks then may start to get out slowly increasing distances up to one mile by 4-6 weeks post op. After may shower and change dressing following bathing with  shower.When bathing remove the brace shower and replace brace before getting out of the shower. If drainage, keep dry dressing and do not  bathe the incision, use an moisture impervious dressing. Please call and return for scheduled follow up appointment 2 weeks from the time of surgery. Take iron twice a day with a meal. Take a stool softner as the iron slows the bowel and will darken the stools.   Driving restrictions    Complete by:  As directed    No driving for 2 weeks and when you are no longer taking a muscle relaxer or narcotics.   Increase activity slowly as tolerated    Complete by:  As directed    Lifting restrictions    Complete by:  As directed    No lifting for 12 weeks      Follow-up Information    Jessy Oto, MD In 2 weeks.   Specialty:  Orthopedic Surgery Why:  For wound re-check Contact information: [place] [address] [phone]            Signed: Sharyne Richters 08/18/2017, 8:32 PM

## 2017-08-19 ENCOUNTER — Telehealth (INDEPENDENT_AMBULATORY_CARE_PROVIDER_SITE_OTHER): Payer: Self-pay | Admitting: Specialist

## 2017-08-19 NOTE — Telephone Encounter (Signed)
ok 

## 2017-08-19 NOTE — Telephone Encounter (Signed)
Varney Biles -nurse with [facility] called advised received referral for [facility] services on 08/17/17. The (PT) will see patient for [facility] services tomorrow. The number to contact Varney Biles is [phone]

## 2017-08-24 ENCOUNTER — Telehealth (INDEPENDENT_AMBULATORY_CARE_PROVIDER_SITE_OTHER): Payer: Self-pay | Admitting: Specialist

## 2017-08-24 ENCOUNTER — Telehealth (INDEPENDENT_AMBULATORY_CARE_PROVIDER_SITE_OTHER): Payer: Self-pay | Admitting: Radiology

## 2017-08-24 DIAGNOSIS — M4316 Spondylolisthesis, lumbar region: Secondary | ICD-10-CM | POA: Diagnosis not present

## 2017-08-24 DIAGNOSIS — M1990 Unspecified osteoarthritis, unspecified site: Secondary | ICD-10-CM | POA: Diagnosis not present

## 2017-08-24 DIAGNOSIS — Z4789 Encounter for other orthopedic aftercare: Secondary | ICD-10-CM | POA: Diagnosis not present

## 2017-08-24 DIAGNOSIS — I10 Essential (primary) hypertension: Secondary | ICD-10-CM | POA: Diagnosis not present

## 2017-08-24 DIAGNOSIS — H8110 Benign paroxysmal vertigo, unspecified ear: Secondary | ICD-10-CM | POA: Diagnosis not present

## 2017-08-24 DIAGNOSIS — J45909 Unspecified asthma, uncomplicated: Secondary | ICD-10-CM | POA: Diagnosis not present

## 2017-08-24 NOTE — Telephone Encounter (Signed)
[name] from [name] calling to update to advise physical therapist is actually seeing the patient today for home health services. They were originally were going out on the 1st of this month.

## 2017-08-24 NOTE — Telephone Encounter (Signed)
Chareese from [facility] called asking for verbal orders of 2 x 1 week and 3 x 1 week. CB# [phone]

## 2017-08-24 NOTE — Telephone Encounter (Signed)
I called and gave verbal ok for this to [facility]

## 2017-08-24 NOTE — Telephone Encounter (Signed)
Ok noted  

## 2017-08-26 DIAGNOSIS — M4316 Spondylolisthesis, lumbar region: Secondary | ICD-10-CM | POA: Diagnosis not present

## 2017-08-26 DIAGNOSIS — I10 Essential (primary) hypertension: Secondary | ICD-10-CM | POA: Diagnosis not present

## 2017-08-26 DIAGNOSIS — M1990 Unspecified osteoarthritis, unspecified site: Secondary | ICD-10-CM | POA: Diagnosis not present

## 2017-08-26 DIAGNOSIS — Z4789 Encounter for other orthopedic aftercare: Secondary | ICD-10-CM | POA: Diagnosis not present

## 2017-08-26 DIAGNOSIS — J45909 Unspecified asthma, uncomplicated: Secondary | ICD-10-CM | POA: Diagnosis not present

## 2017-08-26 DIAGNOSIS — H8110 Benign paroxysmal vertigo, unspecified ear: Secondary | ICD-10-CM | POA: Diagnosis not present

## 2017-08-29 DIAGNOSIS — M4316 Spondylolisthesis, lumbar region: Secondary | ICD-10-CM | POA: Diagnosis not present

## 2017-08-29 DIAGNOSIS — Z4789 Encounter for other orthopedic aftercare: Secondary | ICD-10-CM | POA: Diagnosis not present

## 2017-08-29 DIAGNOSIS — M1990 Unspecified osteoarthritis, unspecified site: Secondary | ICD-10-CM | POA: Diagnosis not present

## 2017-08-29 DIAGNOSIS — I10 Essential (primary) hypertension: Secondary | ICD-10-CM | POA: Diagnosis not present

## 2017-08-29 DIAGNOSIS — H8110 Benign paroxysmal vertigo, unspecified ear: Secondary | ICD-10-CM | POA: Diagnosis not present

## 2017-08-29 DIAGNOSIS — J45909 Unspecified asthma, uncomplicated: Secondary | ICD-10-CM | POA: Diagnosis not present

## 2017-08-31 ENCOUNTER — Encounter (INDEPENDENT_AMBULATORY_CARE_PROVIDER_SITE_OTHER): Payer: Self-pay | Admitting: Surgery

## 2017-08-31 ENCOUNTER — Ambulatory Visit (INDEPENDENT_AMBULATORY_CARE_PROVIDER_SITE_OTHER): Payer: Medicare HMO | Admitting: Surgery

## 2017-08-31 ENCOUNTER — Inpatient Hospital Stay (INDEPENDENT_AMBULATORY_CARE_PROVIDER_SITE_OTHER): Payer: Medicare Other | Admitting: Specialist

## 2017-08-31 ENCOUNTER — Ambulatory Visit (INDEPENDENT_AMBULATORY_CARE_PROVIDER_SITE_OTHER): Payer: Medicare HMO

## 2017-08-31 DIAGNOSIS — Z981 Arthrodesis status: Secondary | ICD-10-CM

## 2017-08-31 DIAGNOSIS — Z4789 Encounter for other orthopedic aftercare: Secondary | ICD-10-CM | POA: Diagnosis not present

## 2017-08-31 DIAGNOSIS — H8110 Benign paroxysmal vertigo, unspecified ear: Secondary | ICD-10-CM | POA: Diagnosis not present

## 2017-08-31 DIAGNOSIS — I10 Essential (primary) hypertension: Secondary | ICD-10-CM | POA: Diagnosis not present

## 2017-08-31 DIAGNOSIS — J45909 Unspecified asthma, uncomplicated: Secondary | ICD-10-CM | POA: Diagnosis not present

## 2017-08-31 DIAGNOSIS — M1990 Unspecified osteoarthritis, unspecified site: Secondary | ICD-10-CM | POA: Diagnosis not present

## 2017-08-31 DIAGNOSIS — M4316 Spondylolisthesis, lumbar region: Secondary | ICD-10-CM | POA: Diagnosis not present

## 2017-08-31 NOTE — Progress Notes (Signed)
Post-Op Visit Note   Patient: Cassie Campbell           Date of Birth: 1941/07/08           MRN: [id number] Visit Date: 08/31/2017 PCP: Tanda Rockers, MD   Assessment & Plan:  Chief Complaint:  Chief Complaint  Patient presents with  . Lower Back - Routine Post Op  Patient 2 weeks status post lumbar fusion returns for follow-up visit. States that she is doing very well. Please with her progress up to this point. Not taking a lot of pain medication.   Visit Diagnoses:  1. Status post lumbar spinal fusion     Plan: Patient will follow up in 4 weeks for recheck. Must continue to wear the lumbar brace until 12 weeks postop at least. Avoid bending twisting lifting and driving. Return sooner if needed.  Follow-Up Instructions: Return in about 4 weeks (around 09/28/2017) for dr Louanne Skye schedule.   Orders:  Orders Placed This Encounter  Procedures  . XR Lumbar Spine 2-3 Views   No orders of the defined types were placed in this encounter.   Imaging: Xr Lumbar Spine 2-3 Views  Result Date: 08/31/2017 X-rays lumbar spine show that hardware is intact. Unchanged from operative films.   PMFS History: Patient Active Problem List   Diagnosis Date Noted  . Postoperative anemia due to acute blood loss 08/16/2017    Class: Acute  . S/P lumbar fusion 08/15/2017  . Erroneous encounter - disregard 08/08/2017  . Gout of foot 04/11/2017  . Spinal stenosis of lumbar region 01/11/2017  . Dyspnea 06/10/2016  . Muscle strain of scapular region 01/22/2016  . CAD in native artery 10/28/2015  . Tinea cruris 07/20/2015  . Right bundle branch block 04/28/2015  . Pain in the chest   . Chest pain, atypical 11/06/2014  . HCAP (healthcare-associated pneumonia) 11/05/2014  . CAD (coronary artery disease)   . Chest pain 10/15/2014  . NSTEMI (non-ST elevated myocardial infarction) ([place]) 10/15/2014  . Health care maintenance 10/22/2013  . Arthralgia of hip 02/12/2013  . GERD  (gastroesophageal reflux disease) 03/10/2012  . Intertriginous candidiasis 11/09/2011  . Atelectasis 02/24/2009  . BENIGN POSITIONAL VERTIGO 11/10/2007  . Hyperlipidemia with target LDL less than 70 10/05/2007  . Essential hypertension 10/05/2007  . Allergic rhinitis 10/05/2007  . Moderate persistent asthma in adult without complication 93/57/0177  . Disorder of bone and cartilage 10/05/2007   Past Medical History:  Diagnosis Date  . Allergic rhinitis   . Arthritis   . Asthma    xolair s 8/05 ?11/07; mastered hfa 12/20/08  . Benign positional vertigo   . CAD (coronary artery disease)    a. 09/2014 NSTEMI s/p LHC with sig 2V dz. dLAD diffusely diseased and not suitable for PCI. unsuccessful RCA PCI d/t heavy calcifications  . Depression   . Dyspnea   . GERD (gastroesophageal reflux disease)   . Heart attack ([place])    09/2014  . Hyperlipidemia    <130 ldl pos fm hx, bp  . Hypertension   . Obesity   . Osteopenia    dexa 08/22/07 AP spine + 1.1, left femur -1.3, right femur -.8; dexa 10/06/09 +1.6, left femur =1.6, right femur -.  . PONV (postoperative nausea and vomiting)   . Ruptured disc, cervical   . 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 Diabetes Mother  AODM  . Stroke Mother   . Hypertension Mother   . Heart disease Father   . Diabetes Sister        AODM  . Breast cancer Sister   . Hypertension Sister   . Breast cancer Daughter 7       dec--mets to liver/spine    Past Surgical History:  Procedure Laterality Date  . BREAST SURGERY  10-26-10   Rt. breast bx--for microcalcifications in rt. retroareolar region--dx was hyalinized fibroadenoma  . BUNIONECTOMY Bilateral   . CARDIAC CATHETERIZATION     2015  . [place] SURGERY  12/01  . LEFT HEART CATHETERIZATION WITH CORONARY ANGIOGRAM N/A 10/16/2014   Procedure: LEFT HEART CATHETERIZATION WITH CORONARY ANGIOGRAM;  Surgeon: Blane Ohara, MD;  [facility];  Service: Cardiovascular;  Laterality: N/A;  . PERCUTANEOUS CORONARY ROTOBLATOR INTERVENTION (PCI-R) N/A 10/17/2014   Procedure: PERCUTANEOUS CORONARY ROTOBLATOR INTERVENTION (PCI-R);  Surgeon: Troy Sine, MD;  Location: [facility] CATH LAB;  Service: Cardiovascular;  Laterality: N/A;  . VEIN LIGATION AND STRIPPING    . VIDEO BRONCHOSCOPY Bilateral 02/05/2015   Procedure: VIDEO BRONCHOSCOPY WITHOUT FLUORO;  Surgeon: Tanda Rockers, MD;  Location: [facility] ENDOSCOPY;  Service: Endoscopy;  Laterality: Bilateral;  . VULVA /PERINEUM BIOPSY  12-30-10   --epidermoid cyst   Social History   Occupational History  . retired [employer] manager    Social History Main Topics  . Smoking status: Never Smoker  . Smokeless tobacco: Never Used  . Alcohol use No  . Drug use: No  . Sexual activity: No   Exam Pleasant female alert and oriented in no acute distress. Surgical incision is healing very well. New Steri-Strips applied. Neurovascular intact. No focal motor deficits.

## 2017-09-01 DIAGNOSIS — M4316 Spondylolisthesis, lumbar region: Secondary | ICD-10-CM | POA: Diagnosis not present

## 2017-09-01 DIAGNOSIS — J45909 Unspecified asthma, uncomplicated: Secondary | ICD-10-CM | POA: Diagnosis not present

## 2017-09-01 DIAGNOSIS — H8110 Benign paroxysmal vertigo, unspecified ear: Secondary | ICD-10-CM | POA: Diagnosis not present

## 2017-09-01 DIAGNOSIS — M1990 Unspecified osteoarthritis, unspecified site: Secondary | ICD-10-CM | POA: Diagnosis not present

## 2017-09-01 DIAGNOSIS — I10 Essential (primary) hypertension: Secondary | ICD-10-CM | POA: Diagnosis not present

## 2017-09-01 DIAGNOSIS — Z4789 Encounter for other orthopedic aftercare: Secondary | ICD-10-CM | POA: Diagnosis not present

## 2017-09-05 ENCOUNTER — Telehealth (INDEPENDENT_AMBULATORY_CARE_PROVIDER_SITE_OTHER): Payer: Self-pay | Admitting: Specialist

## 2017-09-05 NOTE — Telephone Encounter (Signed)
Patient called stating that when she take a shower or when she changes clothes, her butterflies are coming off.  She would like to know if they need to stay on and also she would like to know if it is okay to use a shower chair to get her hair washed.  CB#[phone].  Thank you.

## 2017-09-07 NOTE — Telephone Encounter (Signed)
I called and advised her the strips can come off now and she can shower.

## 2017-09-09 ENCOUNTER — Other Ambulatory Visit: Payer: Self-pay | Admitting: Cardiovascular Disease

## 2017-09-24 ENCOUNTER — Other Ambulatory Visit: Payer: Self-pay | Admitting: Internal Medicine

## 2017-09-24 ENCOUNTER — Other Ambulatory Visit: Payer: Self-pay | Admitting: Cardiovascular Disease

## 2017-09-29 ENCOUNTER — Ambulatory Visit (INDEPENDENT_AMBULATORY_CARE_PROVIDER_SITE_OTHER): Payer: Medicare HMO | Admitting: Specialist

## 2017-09-29 ENCOUNTER — Encounter (INDEPENDENT_AMBULATORY_CARE_PROVIDER_SITE_OTHER): Payer: Self-pay | Admitting: Specialist

## 2017-09-29 ENCOUNTER — Ambulatory Visit (INDEPENDENT_AMBULATORY_CARE_PROVIDER_SITE_OTHER): Payer: Medicare HMO

## 2017-09-29 ENCOUNTER — Ambulatory Visit (INDEPENDENT_AMBULATORY_CARE_PROVIDER_SITE_OTHER): Payer: Medicare HMO | Admitting: Surgery

## 2017-09-29 VITALS — BP 144/84 | HR 80 | Ht 59.0 in | Wt 140.0 lb

## 2017-09-29 DIAGNOSIS — Z981 Arthrodesis status: Secondary | ICD-10-CM

## 2017-09-29 NOTE — Patient Instructions (Signed)
Plan: Avoid frequent bending and stooping  No lifting greater than 10 lbs. May use ice or moist heat for pain. Weight loss is of benefit. Handicap license is approved.

## 2017-09-29 NOTE — Progress Notes (Signed)
Post-Op Visit Note   Patient: Cassie Campbell           Date of Birth: April 15, 1941           MRN: [id number] Visit Date: 09/29/2017 PCP: Tanda Rockers, MD   Assessment & Plan: 6 weeks post L4-5 fusion with pedicle screws and rods and cage. Doing well.  Chief Complaint:  Chief Complaint  Patient presents with  . Lower Back - Routine Post Op, Wound Check  Incision is healed. Lumbar muscles with some scar. LE strength is normal. Visit Diagnoses:  1. Status post lumbar spinal fusion     Plan: Avoid frequent bending and stooping  No lifting greater than 10 lbs. May use ice or moist heat for pain. Weight loss is of benefit. Handicap license is approved.    Follow-Up Instructions: No Follow-up on file.   Orders:  Orders Placed This Encounter  Procedures  . XR Lumbar Spine 2-3 Views   No orders of the defined types were placed in this encounter.   Imaging: No results found.  PMFS History: Patient Active Problem List   Diagnosis Date Noted  . Postoperative anemia due to acute blood loss 08/16/2017    Priority: Medium    Class: Acute  . S/P lumbar fusion 08/15/2017  . Erroneous encounter - disregard 08/08/2017  . Gout of foot 04/11/2017  . Spinal stenosis of lumbar region 01/11/2017  . Dyspnea 06/10/2016  . Muscle strain of scapular region 01/22/2016  . CAD in native artery 10/28/2015  . Tinea cruris 07/20/2015  . Right bundle branch block 04/28/2015  . Pain in the chest   . Chest pain, atypical 11/06/2014  . HCAP (healthcare-associated pneumonia) 11/05/2014  . CAD (coronary artery disease)   . Chest pain 10/15/2014  . NSTEMI (non-ST elevated myocardial infarction) ([place]) 10/15/2014  . Health care maintenance 10/22/2013  . Arthralgia of hip 02/12/2013  . GERD (gastroesophageal reflux disease) 03/10/2012  . Intertriginous candidiasis 11/09/2011  . Atelectasis 02/24/2009  . BENIGN POSITIONAL VERTIGO 11/10/2007  . Hyperlipidemia with target LDL less than 70  10/05/2007  . Essential hypertension 10/05/2007  . Allergic rhinitis 10/05/2007  . Moderate persistent asthma in adult without complication 32/20/2542  . Disorder of bone and cartilage 10/05/2007   Past Medical History:  Diagnosis Date  . Allergic rhinitis   . Arthritis   . Asthma    xolair s 8/05 ?11/07; mastered hfa 12/20/08  . Benign positional vertigo   . CAD (coronary artery disease)    a. 09/2014 NSTEMI s/p LHC with sig 2V dz. dLAD diffusely diseased and not suitable for PCI. unsuccessful RCA PCI d/t heavy calcifications  . Depression   . Dyspnea   . GERD (gastroesophageal reflux disease)   . Heart attack ([place])    09/2014  . Hyperlipidemia    <130 ldl pos fm hx, bp  . Hypertension   . Obesity   . Osteopenia    dexa 08/22/07 AP spine + 1.1, left femur -1.3, right femur -.8; dexa 10/06/09 +1.6, left femur =1.6, right femur -.  . PONV (postoperative nausea and vomiting)   . Ruptured disc, cervical   . 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 Diabetes Mother        AODM  . Stroke Mother   . Hypertension Mother   . Heart disease Father   . Diabetes Sister  AODM  . Breast cancer Sister   . Hypertension Sister   . Breast cancer Daughter 60       dec--mets to liver/spine    Past Surgical History:  Procedure Laterality Date  . BREAST SURGERY  10-26-10   Rt. breast bx--for microcalcifications in rt. retroareolar region--dx was hyalinized fibroadenoma  . BUNIONECTOMY Bilateral   . CARDIAC CATHETERIZATION     2015  . [place] SURGERY  12/01  . LEFT HEART CATHETERIZATION WITH CORONARY ANGIOGRAM N/A 10/16/2014   Procedure: LEFT HEART CATHETERIZATION WITH CORONARY ANGIOGRAM;  Surgeon: Blane Ohara, MD;  Location: [facility] CATH LAB;  Service: Cardiovascular;  Laterality: N/A;  . PERCUTANEOUS CORONARY ROTOBLATOR INTERVENTION (PCI-R) N/A 10/17/2014   Procedure: PERCUTANEOUS CORONARY ROTOBLATOR  INTERVENTION (PCI-R);  Surgeon: Troy Sine, MD;  Location: [facility] CATH LAB;  Service: Cardiovascular;  Laterality: N/A;  . VEIN LIGATION AND STRIPPING    . VIDEO BRONCHOSCOPY Bilateral 02/05/2015   Procedure: VIDEO BRONCHOSCOPY WITHOUT FLUORO;  Surgeon: Tanda Rockers, MD;  Location: [facility] ENDOSCOPY;  Service: Endoscopy;  Laterality: Bilateral;  . VULVA /PERINEUM BIOPSY  12-30-10   --epidermoid cyst   Social History   Occupational History  . retired Tour manager    Social History Main Topics  . Smoking status: Never Smoker  . Smokeless tobacco: Never Used  . Alcohol use No  . Drug use: No  . Sexual activity: No

## 2017-10-16 ENCOUNTER — Other Ambulatory Visit: Payer: Self-pay | Admitting: Cardiovascular Disease

## 2017-10-17 NOTE — Telephone Encounter (Signed)
REFILL 

## 2017-11-08 DIAGNOSIS — H524 Presbyopia: Secondary | ICD-10-CM | POA: Diagnosis not present

## 2017-11-08 DIAGNOSIS — H2511 Age-related nuclear cataract, right eye: Secondary | ICD-10-CM | POA: Diagnosis not present

## 2017-11-08 DIAGNOSIS — H5213 Myopia, bilateral: Secondary | ICD-10-CM | POA: Diagnosis not present

## 2017-11-09 ENCOUNTER — Ambulatory Visit (INDEPENDENT_AMBULATORY_CARE_PROVIDER_SITE_OTHER): Payer: Medicare HMO | Admitting: Specialist

## 2017-11-09 ENCOUNTER — Ambulatory Visit (INDEPENDENT_AMBULATORY_CARE_PROVIDER_SITE_OTHER): Payer: Medicare HMO

## 2017-11-09 ENCOUNTER — Encounter (INDEPENDENT_AMBULATORY_CARE_PROVIDER_SITE_OTHER): Payer: Self-pay | Admitting: Specialist

## 2017-11-09 VITALS — BP 139/84 | HR 78 | Ht 59.0 in | Wt 140.0 lb

## 2017-11-09 DIAGNOSIS — Z981 Arthrodesis status: Secondary | ICD-10-CM

## 2017-11-09 NOTE — Patient Instructions (Signed)
Plan: Avoid frequent bending and stooping  No lifting greater than 15-20 lbs. May use ice or moist heat for pain. Weight loss is of benefit. Handicap license is approved. Wean from brace 1/2 days wearing for 2 weeks then stop or ramp up to being out of the the brace 2 hours per week until you are out of the brace 8 hours per day then may discontinue.

## 2017-11-09 NOTE — Progress Notes (Signed)
Post-Op Visit Note   Patient: Cassie Campbell           Date of Birth: March 16, 1941           MRN: [id number] Visit Date: 11/09/2017 PCP: Tanda Rockers, MD   Assessment & Plan:3 month post L4-5 fusion.  Chief Complaint:  Chief Complaint  Patient presents with  . Lower Back - Routine Post Op   Visit Diagnoses:  1. Status post lumbar spinal fusion   Radographs show no motion with Flexion and extension Incision is well healed, Motor normal No pain meds except occasional celebrex.  Plan: Avoid frequent bending and stooping  No lifting greater than 15-20 lbs. May use ice or moist heat for pain. Weight loss is of benefit. Handicap license is approved. Wean from brace 1/2 days wearing for 2 weeks then stop or ramp up to being out of the the brace 2 hours per week until you are out of the brace 8 hours per day then may discontinue.   Follow-Up Instructions: No Follow-up on file.   Orders:  Orders Placed This Encounter  Procedures  . XR Lumbar Spine 2-3 Views   No orders of the defined types were placed in this encounter.   Imaging: No results found.  PMFS History: Patient Active Problem List   Diagnosis Date Noted  . Postoperative anemia due to acute blood loss 08/16/2017    Priority: Medium    Class: Acute  . S/P lumbar fusion 08/15/2017  . Erroneous encounter - disregard 08/08/2017  . Gout of foot 04/11/2017  . Spinal stenosis of lumbar region 01/11/2017  . Dyspnea 06/10/2016  . Muscle strain of scapular region 01/22/2016  . CAD in native artery 10/28/2015  . Tinea cruris 07/20/2015  . Right bundle branch block 04/28/2015  . Pain in the chest   . Chest pain, atypical 11/06/2014  . HCAP (healthcare-associated pneumonia) 11/05/2014  . CAD (coronary artery disease)   . Chest pain 10/15/2014  . NSTEMI (non-ST elevated myocardial infarction) ([place]) 10/15/2014  . Health care maintenance 10/22/2013  . Arthralgia of hip 02/12/2013  . GERD (gastroesophageal  reflux disease) 03/10/2012  . Intertriginous candidiasis 11/09/2011  . Atelectasis 02/24/2009  . BENIGN POSITIONAL VERTIGO 11/10/2007  . Hyperlipidemia with target LDL less than 70 10/05/2007  . Essential hypertension 10/05/2007  . Allergic rhinitis 10/05/2007  . Moderate persistent asthma in adult without complication 43/32/9518  . Disorder of bone and cartilage 10/05/2007   Past Medical History:  Diagnosis Date  . Allergic rhinitis   . Arthritis   . Asthma    xolair s 8/05 ?11/07; mastered hfa 12/20/08  . Benign positional vertigo   . CAD (coronary artery disease)    a. 09/2014 NSTEMI s/p LHC with sig 2V dz. dLAD diffusely diseased and not suitable for PCI. unsuccessful RCA PCI d/t heavy calcifications  . Depression   . Dyspnea   . GERD (gastroesophageal reflux disease)   . Heart attack ([place])    09/2014  . Hyperlipidemia    <130 ldl pos fm hx, bp  . Hypertension   . Obesity   . Osteopenia    dexa 08/22/07 AP spine + 1.1, left femur -1.3, right femur -.8; dexa 10/06/09 +1.6, left femur =1.6, right femur -.  . PONV (postoperative nausea and vomiting)   . Ruptured disc, cervical   . 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 Diabetes  Mother        AODM  . Stroke Mother   . Hypertension Mother   . Heart disease Father   . Diabetes Sister        AODM  . Breast cancer Sister   . Hypertension Sister   . Breast cancer Daughter 105       dec--mets to liver/spine    Past Surgical History:  Procedure Laterality Date  . BREAST SURGERY  10-26-10   Rt. breast bx--for microcalcifications in rt. retroareolar region--dx was hyalinized fibroadenoma  . BUNIONECTOMY Bilateral   . CARDIAC CATHETERIZATION     2015  . [place] SURGERY  12/01  . LEFT HEART CATHETERIZATION WITH CORONARY ANGIOGRAM N/A 10/16/2014   Procedure: LEFT HEART CATHETERIZATION WITH CORONARY ANGIOGRAM;  Surgeon: Blane Ohara, MD;   Location: [facility] CATH LAB;  Service: Cardiovascular;  Laterality: N/A;  . PERCUTANEOUS CORONARY ROTOBLATOR INTERVENTION (PCI-R) N/A 10/17/2014   Procedure: PERCUTANEOUS CORONARY ROTOBLATOR INTERVENTION (PCI-R);  Surgeon: Troy Sine, MD;  Location: [facility] CATH LAB;  Service: Cardiovascular;  Laterality: N/A;  . VEIN LIGATION AND STRIPPING    . VIDEO BRONCHOSCOPY Bilateral 02/05/2015   Procedure: VIDEO BRONCHOSCOPY WITHOUT FLUORO;  Surgeon: Tanda Rockers, MD;  Location: [facility] ENDOSCOPY;  Service: Endoscopy;  Laterality: Bilateral;  . VULVA /PERINEUM BIOPSY  12-30-10   --epidermoid cyst   Social History   Occupational History  . Occupation: retired Tour manager  Tobacco Use  . Smoking status: Never Smoker  . Smokeless tobacco: Never Used  Substance and Sexual Activity  . Alcohol use: No    Alcohol/week: 0.0 oz  . Drug use: No  . Sexual activity: No    Partners: Male    Birth control/protection: Post-menopausal

## 2017-11-16 ENCOUNTER — Encounter: Payer: Self-pay | Admitting: Cardiovascular Disease

## 2017-11-16 ENCOUNTER — Ambulatory Visit (INDEPENDENT_AMBULATORY_CARE_PROVIDER_SITE_OTHER): Payer: Medicare HMO | Admitting: Cardiovascular Disease

## 2017-11-16 VITALS — BP 102/66 | HR 76 | Ht 59.0 in | Wt 135.4 lb

## 2017-11-16 DIAGNOSIS — J4520 Mild intermittent asthma, uncomplicated: Secondary | ICD-10-CM

## 2017-11-16 DIAGNOSIS — I10 Essential (primary) hypertension: Secondary | ICD-10-CM

## 2017-11-16 DIAGNOSIS — I4510 Unspecified right bundle-branch block: Secondary | ICD-10-CM

## 2017-11-16 DIAGNOSIS — I2510 Atherosclerotic heart disease of native coronary artery without angina pectoris: Secondary | ICD-10-CM | POA: Diagnosis not present

## 2017-11-16 DIAGNOSIS — E785 Hyperlipidemia, unspecified: Secondary | ICD-10-CM | POA: Diagnosis not present

## 2017-11-16 NOTE — Progress Notes (Signed)
Patient ID: Cassie Campbell, female   DOB: 07/04/1941, 76 y.o.   MRN: [id number]     HPI: Cassie Campbell is a 76 y.o. female who presents for 7 month follow-up cardiology evaluation.    Cassie Campbell has a history of complex CAD and underwent catheterization by Dr. Fletcher Anon with class IV symptoms on 10/16/2014.  She was found to have diffusely diseased LAD, which was not suitable for revascularization.  She had ruled in for non-ST segment elevation myocardial infarction, felt to be due to a severely calcified almost subtotal RCA with severe diffuse calcified disease.  Initial attempt at PCI by Dr. Fletcher Anon was unsuccessful due to the severe calcification.  She was brought back to the laboratory the following day, and I performed a very long attempt at high-speed rotational atherectomy.  Unfortunately, due to the severely stenosed calcified lesion above the acute margin the Roto floppy wire was never be advanced distal enough due to severe disease beyond this site to allow the burr to be inserted to reach the stenosis.  Multiple attempts were made to utilize wire transfer, but even the smallest catheter was never able to pass the stenosis to allow for the Roto floppy wire to be reinserted in exchange for a Fielder XT wire, which was able to cross the stenosis and advanced distally.  Consequently, the procedure was aborted.  She was sent home on increased medication regimen with potential plans for possible follow-up evaluation depending upon symptom status.  Since hospital discharge, she has not experienced any significant recurrent anginal chest pain.  During the hospitalization ranolazine was added initially at 500 mg twice a day to isosorbide mononitrate, amlodipine, and beta blocker therapy.  She has been treated with aspirin and Brilinta for dual antiplatelet therapy. When I saw her for subsequent evaluation I further titrated her ranolazine to 1000 mg twice a day and further titrated her Lopressor to 37.5 mg  twice a day.  Her creatinine remained normal following her contrast load and she was mildly anemic.    She has a history of asthma and is on Symbicort 160-4.5 and Singulair.  She  has a history of hyperlipidemia, currently on Lipitor 80 mg.  She does have GERD for which he takes Pepcid as well as omeprazole.  She developed some increased wheezing on Lopressor and was changed to bisoprolol 5 mg in place of metoprolol.  She has seen Dr. Melvyn Novas for her severe chronic asthma and in the past.  She also was found to have marked atopy with significant elevation of IgG E levels.  She feels that her breathing has improved with the changed to bisoprolol from Lopressor.   In March 2016 she had been remaining stable but  had experienced a 10-15 minute episode of chest pain during the evening which responded to nitroglycerin. When I saw her the following day she had been taking isosorbide 90 mg in the morning and I added isosorbide 30 mg at bedtime to her medical regimen of Ranexa 1000 mg twice a day , amlodipine 10 mg daily, and bisoprolol 5 mg. She denies recurrent chest pain symptomatology since the nocturnal dose of Imdur was added.  She developed a rash and  was advised to stop both Brilinta and Ranexa.  She was started on Plavix. Her rash ultimately resolved.    Her husband passed away in on 2016/01/09.  She has been more active.  She denies nocturnal symptoms.  She was evaluated in the emergency room in January 2017 with  right shoulder discomfort.    Since I last saw her in April 2018, she underwent low back surgery in August 2018 by Dr. Lelan Pons.  She tolerated surgery well.  She denies chest pain.  She denies change in any of her shortness of breath.  She has a history of mild asthma.  She is unaware of palpitations.  She has continued to be on atorvastatin 80 mg and Zetia 10 mg for hyperlipidemia.  She is on Plavix antiplatelet therapy.  She has continued to be on amlodipine 10 mg, isosorbide 90 mg in the morning  and 30 mg in the evening, bisoprolol 5 mg daily, and addition to Ranexa 500 mg twice a day for her CAD, and blood pressure.  She presents for evaluation.  Past Medical History:  Diagnosis Date  . Allergic rhinitis   . Arthritis   . Asthma    xolair s 8/05 ?11/07; mastered hfa 12/20/08  . Benign positional vertigo   . CAD (coronary artery disease)    a. 09/2014 NSTEMI s/p LHC with sig 2V dz. dLAD diffusely diseased and not suitable for PCI. unsuccessful RCA PCI d/t heavy calcifications  . Depression   . Dyspnea   . GERD (gastroesophageal reflux disease)   . Heart attack ([place])    09/2014  . Hyperlipidemia    <130 ldl pos fm hx, bp  . Hypertension   . Obesity   . Osteopenia    dexa 08/22/07 AP spine + 1.1, left femur -1.3, right femur -.8; dexa 10/06/09 +1.6, left femur =1.6, right femur -.  . PONV (postoperative nausea and vomiting)   . Ruptured disc, cervical   . 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 BREAST SURGERY  10-26-10   Rt. breast bx--for microcalcifications in rt. retroareolar region--dx was hyalinized fibroadenoma  . BUNIONECTOMY Bilateral   . CARDIAC CATHETERIZATION     2015  . [place] SURGERY  12/01  . LEFT HEART CATHETERIZATION WITH CORONARY ANGIOGRAM N/A 10/16/2014   Procedure: LEFT HEART CATHETERIZATION WITH CORONARY ANGIOGRAM;  Surgeon: Blane Ohara, MD;  Location: [facility] CATH LAB;  Service: Cardiovascular;  Laterality: N/A;  . PERCUTANEOUS CORONARY ROTOBLATOR INTERVENTION (PCI-R) N/A 10/17/2014   Procedure: PERCUTANEOUS CORONARY ROTOBLATOR INTERVENTION (PCI-R);  Surgeon: Troy Sine, MD;  Location: [facility] CATH LAB;  Service: Cardiovascular;  Laterality: N/A;  . VEIN LIGATION AND STRIPPING    . VIDEO BRONCHOSCOPY Bilateral 02/05/2015   Procedure: VIDEO BRONCHOSCOPY WITHOUT FLUORO;  Surgeon: Tanda Rockers, MD;  Location: [facility] ENDOSCOPY;  Service: Endoscopy;  Laterality: Bilateral;    . VULVA /PERINEUM BIOPSY  12-30-10   --epidermoid cyst    Allergies  Allergen Reactions  . Fish-Derived Products Shortness Of Breath, Swelling and Rash  . Penicillins Shortness Of Breath, Swelling and Rash    PATIENT HAS HAD A PCN REACTION WITH IMMEDIATE RASH, FACIAL/TONGUE/THROAT SWELLING, SOB, OR LIGHTHEADEDNESS WITH HYPOTENSION:  #  #  #  YES  #  #  #   Has patient had a PCN reaction causing severe rash involving mucus membranes or skin necrosis: No PATIENT HAS HAD A PCN REACTION THAT REQUIRED HOSPITALIZATION:  #  #  #  YES  #  #  #   Has patient had a PCN reaction occurring within the last 10 years: No    . Aspirin Swelling and Rash    SWELLING REACTION UNSPECIFIED   . Brilinta [Ticagrelor] Rash  Started Brilinta 09/2014 - rash - unsure which it is directly related to  . Codeine Phosphate Nausea Only  . Diphenhydramine Hcl Rash    Current Outpatient Medications  Medication Sig Dispense Refill  . amLODipine (NORVASC) 10 MG tablet TAKE 1 TABLET BY MOUTH EVERY DAY 90 tablet 3  . atorvastatin (LIPITOR) 80 MG tablet TAKE 1 TABLET(80 MG) BY MOUTH DAILY 90 tablet 2  . bisoprolol (ZEBETA) 5 MG tablet Take 1 tablet (5 mg total) by mouth daily. 30 tablet 11  . budesonide-formoterol (SYMBICORT) 160-4.5 MCG/ACT inhaler Inhale 2 puffs into the lungs 2 (two) times daily. 1 Inhaler 0  . celecoxib (CELEBREX) 200 MG capsule Take 200 mg by mouth as needed.    . cetirizine (ZYRTEC) 10 MG tablet Take 10 mg by mouth at bedtime as needed (itching and sneezing).     . Cholecalciferol (VITAMIN D3) 2000 units TABS Take 2,000 Units by mouth daily.    . cholecalciferol 2000 units TABS Take 1 tablet (2,000 Units total) by mouth daily. 30 tablet 3  . clopidogrel (PLAVIX) 75 MG tablet TAKE 1 TABLET BY MOUTH EVERY DAY 90 tablet 3  . clotrimazole-betamethasone (LOTRISONE) cream Apply 1 application topically as needed (itching). Use as directed for rash    . dextromethorphan-guaiFENesin (MUCINEX DM) 30-600  MG per 12 hr tablet Take 1-2 tablets by mouth every 12 (twelve) hours as needed for cough (with flutter).     . ezetimibe (ZETIA) 10 MG tablet TAKE 1 TABLET(10 MG) BY MOUTH DAILY 30 tablet 5  . famotidine (PEPCID) 20 MG tablet Take 20 mg by mouth at bedtime.     . fluticasone (FLONASE) 50 MCG/ACT nasal spray Place 2 sprays into both nostrils 2 (two) times daily as needed for allergies or rhinitis.     . furosemide (LASIX) 20 MG tablet Take 1 tablet (20 mg total) by mouth daily. 30 tablet 11  . isosorbide mononitrate (IMDUR) 60 MG 24 hr tablet TAKE 1 AND 1/2 TABLETS BY MOUTH EVERY MORNING AND 1/2 TABLET EVERY EVENING 180 tablet 1  . meclizine (ANTIVERT) 25 MG tablet TAKE 2 TABLETS BY MOUTH THREE TIMES DAILY AS NEEDED (Patient taking differently: TAKE 2 TABLETS BY MOUTH THREE TIMES DAILY AS NEEDED FOR DIZZNESS) 24 tablet 0  . montelukast (SINGULAIR) 10 MG tablet TAKE 1 TABLET(10 MG) BY MOUTH AT BEDTIME 30 tablet 5  . Multiple Vitamin (MULTIVITAMIN) capsule Take 1 capsule by mouth daily.     . nitroGLYCERIN (NITROSTAT) 0.4 MG SL tablet Place 0.4 mg under the tongue every 5 (five) minutes as needed for chest pain (may repeat x3).    Marland Kitchen omeprazole (PRILOSEC) 20 MG capsule Take 20 mg by mouth daily before breakfast.     . oxymetazoline (AFRIN) 0.05 % nasal spray Place 2 sprays into both nostrils 2 (two) times daily as needed for congestion. as needed for stuffy nose and sinus problems    . PROAIR HFA 108 (90 Base) MCG/ACT inhaler INHALE 1 TO 2 PUFFS BY MOUTH EVERY 4 HOURS AS NEEDED FOR WHEEZING 8.5 g 5  . RANEXA 500 MG 12 hr tablet TAKE 1 TABLET(500 MG) BY MOUTH TWICE DAILY 60 tablet 1  . sodium chloride (OCEAN) 0.65 % nasal spray Place 2 sprays into the nose every 4 (four) hours as needed (nasal congestion).      No current facility-administered medications for this visit.     Social History   Socioeconomic History  . Marital status: Married    Spouse name: Not  on file  . Number of children: Not on  file  . Years of education: Not on file  . Highest education level: Not on file  Social Needs  . Financial resource strain: Not on file  . Food insecurity - worry: Not on file  . Food insecurity - inability: Not on file  . Transportation needs - medical: Not on file  . Transportation needs - non-medical: Not on file  Occupational History  . Occupation: retired Tour manager  Tobacco Use  . Smoking status: Never Smoker  . Smokeless tobacco: Never Used  Substance and Sexual Activity  . Alcohol use: No    Alcohol/week: 0.0 oz  . Drug use: No  . Sexual activity: No    Partners: Male    Birth control/protection: Post-menopausal  Other Topics Concern  . Not on file  Social History Narrative  . Not on file    Family History  Problem Relation Age of Onset  . Diabetes Mother        AODM  . Stroke Mother   . Hypertension Mother   . Heart disease Father   . Diabetes Sister        AODM  . Breast cancer Sister   . Hypertension Sister   . Breast cancer Daughter 36       dec--mets to liver/spine    ROS General: Negative; No fevers, chills, or night sweats HEENT: Negative; No changes in vision or hearing, sinus congestion, difficulty swallowing Pulmonary: positive for asthma; No cough, wheezing, shortness of breath, hemoptysis Cardiovascular: See HPI:  GI: Negative; No nausea, vomiting, diarrhea, or abdominal pain GU: Negative; No dysuria, hematuria, or difficulty voiding Musculoskeletal: Occasional right shoulder discomfort, and low back discomfort Hematologic: Negative; no easy bruising, bleeding Endocrine: Negative; no heat/cold intolerance; no diabetes, Neuro: Negative; no changes in balance, headaches Skin: Negative; No rashes or skin lesions Psychiatric: Negative; No behavioral problems, depression Sleep: Negative; No snoring,  daytime sleepiness, hypersomnolence, bruxism, restless legs, hypnogognic hallucinations. Other comprehensive 14 point system review is  negative   Physical Exam BP 102/66   Pulse 76   Ht _0  (1.499 m)   Wt 135 lb 6.4 oz (61.4 kg)   LMP 12/21/1999   BMI 27.35 kg/m    Repeat blood pressure by me was 120/70 supine, 118/72 standing  Wt Readings from Last 3 Encounters:  11/16/17 135 lb 6.4 oz (61.4 kg)  11/09/17 140 lb (63.5 kg)  09/29/17 140 lb (63.5 kg)   General: Alert, oriented, no distress.  Skin: normal turgor, no rashes, warm and dry HEENT: Normocephalic, atraumatic. Pupils equal round and reactive to light; sclera anicteric; extraocular muscles intact;  Nose without nasal septal hypertrophy Mouth/Parynx benign; Mallinpatti scale 3 Neck: No JVD, no carotid bruits; normal carotid upstroke Lungs: clear to ausculatation and percussion; no wheezing or rales Chest wall: without tenderness to palpitation Heart: PMI not displaced, RRR, s1 s2 normal, 1/6 systolic murmur, no diastolic murmur, no rubs, gallops, thrills, or heaves Abdomen: soft, nontender; no hepatosplenomehaly, BS+; abdominal aorta nontender and not dilated by palpation. Back: no CVA tenderness Pulses 2+ Musculoskeletal: full range of motion, normal strength, no joint deformities Extremities: no clubbing cyanosis or edema, Homan's sign negative  Neurologic: grossly nonfocal; Cranial nerves grossly wnl Psychologic: Normal mood and affect   ECG (independently read by me): Normal sinus rhythm at 75 bpm with right bundle branch block.  Left axis deviation.  Q waves in 3 and aVF.  April 2018 ECG (independently read by  me): Normal sinus rhythm at 74 bpm.  Right bundle-branch block with repolarization changes.  October 2017 ECG (independently read by me): Normal sinus rhythm at 68 bpm.  The right bundle branch block with repolarization changes.  April 2017 ECG (independently read by me): Normal sinus rhythm at 70 bpm.  Right bundle-branch block with repolarization changes.  November 2016 ECG (independently read by me): Normal sinus rhythm at 70 bpm.   Right bundle-branch block with repolarization changes.  May 2016 ECG (independently read by me):  Normal sinus rhythm at 65 bpm. Right bundle branch block with repolarization changes.  March 2016 ECG (independently read by me): Normal sinus rhythm at 68 bpm.  No ectopy.  No signal been ST segment changes.  December 2015 ECG (independently read by me): Normal sinus rhythm at 77 bpm.  Normal intervals.  No significant ST changes.  November 2015 ECG (independently read by me):sinus rhythm at 88 bpm.  QTc interval 454 ms.  No significant ST-T changes.  LABS:  BMP Latest Ref Rng & Units 08/16/2017 08/10/2017 10/04/2016  Glucose 65 - 99 mg/dL 115(H) 102(H) 95  BUN 6 - 20 mg/dL <5(L) 6 10  Creatinine 0.44 - 1.00 mg/dL 0.65 0.68 0.71  Sodium 135 - 145 mmol/L 139 139 140  Potassium 3.5 - 5.1 mmol/L 3.8 4.2 3.9  Chloride 101 - 111 mmol/L 105 104 100  CO2 22 - 32 mmol/L _0 Calcium 8.9 - 10.3 mg/dL 8.6(L) 9.9 9.4    Hepatic Function Latest Ref Rng & Units 08/10/2017 10/04/2016 03/25/2016  Total Protein 6.5 - 8.1 g/dL 8.0 7.0 7.7  Albumin 3.5 - 5.0 g/dL 3.8 3.9 4.3  AST 15 - 41 U/L _1 ALT 14 - 54 U/L _2 Alk Phosphatase 38 - 126 U/L 80 69 77  Total Bilirubin 0.3 - 1.2 mg/dL 0.9 0.8 0.8  Bilirubin, Direct 0.1 - 0.5 mg/dL 0.1 - -    CBC Latest Ref Rng & Units 08/18/2017 08/17/2017 08/16/2017  WBC 4.0 - 10.5 K/uL 8.5 10.9(H) 12.5(H)  Hemoglobin 12.0 - 15.0 g/dL 8.9(L) 8.3(L) 8.7(L)  Hematocrit 36.0 - 46.0 % 26.7(L) 25.9(L) 26.5(L)  Platelets 150 - 400 K/uL 343 298 318   Lab Results  Component Value Date   MCV 88.1 08/18/2017   MCV 89.3 08/17/2017   MCV 88.6 08/16/2017   Lab Results  Component Value Date   TSH 1.49 06/10/2016  No results found for: HGBA1C  Lipid Panel     Component Value Date/Time   CHOL 167 10/04/2016 1029   TRIG 117 10/04/2016 1029   TRIG 85 10/05/2006 1022   HDL 62 10/04/2016 1029   CHOLHDL 2.7 10/04/2016 1029   VLDL 23 10/04/2016 1029    LDLCALC 82 10/04/2016 1029   RADIOLOGY: Xr Lumbar Spine 2-3 Views  Result Date: 11/09/2017 Radiographs AP and lateral flexion and extension radiographs of the lumbar spine show hardware post fusion L4-5 in good position and alignment, no motion across the fused segment L4-5.   IMPRESSION:  1. Coronary artery disease involving native coronary artery of native heart without angina pectoris   2. Essential hypertension   3. Hyperlipidemia with target LDL less than 70   4. Right bundle branch block   5. Mild intermittent asthma without complication     ASSESSMENT AND PLAN: Mrs. Lovada Barwick is a 76 year old African-American female who suffered a NSTEMI in October 2015 which was felt to be due to subtotal high-grade calcified RCA with  severe diffuse distal disease beyond her subtotal stenosis.  She also has a severely diffusely diseased mid distal LAD which is not amenable for revascularization.  At the time of her intervention, the smallest balloon catheter that was available in the catheterization laboratory was a 1.20 mm and this was unable to cross the stenosis.  Since that time, we have been titrating her medical regimen.  Fortunately, she has responded well to aggressive medical regimen consisting of amlodipine 10 mg, bisoprolol 5 mg, isosorbide mononitrate 90 mg in the morning and 30 mg at night in addition to ranexa 500 mg twice a day.  .  She is not having any anginal symptoms.  She has chronic right bundle branch block on ECG.  She did have occasional rhonchi on exam and takes Symbicort in addition to Singulair and as needed Pro-air for asthma.  She is on aggressive lipid therapy with atorvastatin 80 mg and Zetia 10 mg with a target LDL less than 70.  If she is unable to reach this level.  She may be a candidate for PCSK inhibition with Repatha based on Fourrier trial data.  She tolerated her low back surgery on 08/15/2017 without cardiovascular compromise.  Her blood pressure today is  stable on her current medical regimen.  I will see her in 6 months for reevaluation.  Time spent: 25 minutes  Troy Sine, MD, [facility]  11/17/2017 7:53 PM

## 2017-11-16 NOTE — Patient Instructions (Signed)

## 2017-11-17 ENCOUNTER — Encounter: Payer: Self-pay | Admitting: Cardiovascular Disease

## 2017-11-23 NOTE — Addendum Note (Signed)
Addended by: Vennie Homans on: 11/23/2017 02:55 PM   Modules accepted: Orders

## 2017-11-23 NOTE — Addendum Note (Signed)
Addended by: Vennie Homans on: 11/23/2017 03:47 PM   Modules accepted: Orders

## 2017-11-24 ENCOUNTER — Ambulatory Visit (INDEPENDENT_AMBULATORY_CARE_PROVIDER_SITE_OTHER): Payer: Medicare HMO | Admitting: Internal Medicine

## 2017-11-24 ENCOUNTER — Encounter: Payer: Self-pay | Admitting: Internal Medicine

## 2017-11-24 ENCOUNTER — Other Ambulatory Visit (INDEPENDENT_AMBULATORY_CARE_PROVIDER_SITE_OTHER): Payer: Medicare HMO

## 2017-11-24 VITALS — BP 110/60 | HR 74 | Ht 59.0 in | Wt 136.0 lb

## 2017-11-24 DIAGNOSIS — I10 Essential (primary) hypertension: Secondary | ICD-10-CM | POA: Diagnosis not present

## 2017-11-24 DIAGNOSIS — J4540 Moderate persistent asthma, uncomplicated: Secondary | ICD-10-CM

## 2017-11-24 LAB — CBC WITH DIFFERENTIAL/PLATELET
BASOS PCT: 0.4 % (ref 0.0–3.0)
Basophils Absolute: 0 10*3/uL (ref 0.0–0.1)
EOS PCT: 5.3 % — AB (ref 0.0–5.0)
Eosinophils Absolute: 0.4 10*3/uL (ref 0.0–0.7)
HCT: 35.7 % — ABNORMAL LOW (ref 36.0–46.0)
Hemoglobin: 12 g/dL (ref 12.0–15.0)
Lymphocytes Relative: 17.9 % (ref 12.0–46.0)
Lymphs Abs: 1.5 10*3/uL (ref 0.7–4.0)
MCHC: 33.7 g/dL (ref 30.0–36.0)
MCV: 87 fl (ref 78.0–100.0)
MONO ABS: 0.5 10*3/uL (ref 0.1–1.0)
MONOS PCT: 6.6 % (ref 3.0–12.0)
NEUTROS PCT: 69.8 % (ref 43.0–77.0)
Neutro Abs: 5.8 10*3/uL (ref 1.4–7.7)
PLATELETS: 506 10*3/uL — AB (ref 150.0–400.0)
RBC: 4.11 Mil/uL (ref 3.87–5.11)
RDW: 14.6 % (ref 11.5–15.5)
WBC: 8.3 10*3/uL (ref 4.0–10.5)

## 2017-11-24 LAB — BASIC METABOLIC PANEL
BUN: 11 mg/dL (ref 6–23)
CHLORIDE: 103 meq/L (ref 96–112)
CO2: 28 mEq/L (ref 19–32)
Calcium: 9.3 mg/dL (ref 8.4–10.5)
Creatinine, Ser: 0.71 mg/dL (ref 0.40–1.20)
GFR: 103.14 mL/min (ref >60.00)
Glucose, Bld: 101 mg/dL — ABNORMAL HIGH (ref 70–99)
POTASSIUM: 3.6 meq/L (ref 3.5–5.1)
SODIUM: 140 meq/L (ref 135–145)

## 2017-11-24 MED ORDER — BUDESONIDE-FORMOTEROL FUMARATE 160-4.5 MCG/ACT IN AERO: 2.0000 | INHALATION_SPRAY | Freq: Two times a day (BID) | RESPIRATORY_TRACT | 0 refills | Status: DC

## 2017-11-24 NOTE — Progress Notes (Signed)
ATC, NA and no VM  

## 2017-11-24 NOTE — Progress Notes (Signed)
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 Follow in pulmonary clinic also for primary care with hbp/ hyperlipidemia complicated by MI 76/16/07 and persistent mucus plug in RML/ RLL s/p fob 02/05/15 with marked improvement p lavage and no endobrchial lesions, just diffuse airway swelling      History of Present Illness   10/14/13 [name] > laser rx for back pain  rad legs > resolved    Admit Date: 10/15/2014 Discharge date: 10/19/2014    PRIMARY DISCHARGE DIAGNOSIS:  NSTEMI (non-ST elevated myocardial infarction)   Essential hypertension  GERD (gastroesophageal reflux disease)  Chest pain  Hyperlipidemia  Hypertension  CAD (coronary artery disease)       04/11/2017  f/u ov/[name] re: asthma/ hbp/ leg swelling with pain R foot  Chief Complaint  Patient presents with  . Follow-up    Increased cough, wheezing anfd runny nose for the past month.    no change off arnuity/ worse though with nasal symptoms x one month  Wheezing better when using the symb 160 2bid  rec Stop bisoprolol-hct and instead take bisoprolol 5 mg daily  Start lasix (furosemide) 20 mg daily Prednisone 10 mg take  4 each am x 2 days,   2 each am x 2 days,  1 each am x 2 days and stop       08/15/17 [name] back surgery     11/24/2017  f/u ov/[name] re:  Asthma/ hbp  Chief Complaint  Patient presents with  . Follow-up    Pt states she is getting over a cold. She is coughing up some clear sputum.  She states when she had the cold she used proair 2 x daily for 5 days.   last week in Nov 2018  bad head/ chest cold s need for pred/abx > back to basline No ex due to back but Not limited by breathing from desired activities  Sleeping fine  No obvious day to day or daytime  variability or assoc excess/ purulent sputum or mucus plugs or hemoptysis or cp or chest tightness, subjective wheeze or overt sinus or hb symptoms. No unusual exposure hx or h/o childhood pna/ asthma or knowledge of premature birth.  Sleeping ok flat without nocturnal  or early am exacerbation  of respiratory  c/o's or need for noct saba. Also denies any obvious fluctuation of symptoms with weather or environmental changes or other aggravating or alleviating factors except as outlined above   Current Allergies, Complete Past Medical History, Past Surgical History, Family History, and Social History were reviewed in [employer] record.  ROS  The following are not active complaints unless bolded Hoarseness, sore throat, dysphagia, dental problems, itching, sneezing,  nasal congestion or discharge of excess mucus or purulent secretions, ear ache,   fever, chills, sweats, unintended wt loss or wt gain, classically pleuritic or exertional cp,  orthopnea pnd or leg swelling, presyncope, palpitations, abdominal pain, anorexia, nausea, vomiting, diarrhea  or change in bowel habits or change in bladder habits, change in stools or change in urine, dysuria, hematuria,  rash, arthralgias, visual complaints, headache, numbness, weakness or ataxia or problems with walking or coordination,  change in mood/affect or memory.  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)  OSTEOPENIA (ICD-733.90)  - DEXA 08/22/07 AP spine + 1.1, left femur -1.3, right femur -.8  - DEXA 10/06/09 + 1.6 left femur -1.6, right femur -.5   -DEXA 2014 no change  HYPERTENSION (ICD-401.9)  HYPERLIPIDEMIA (ICD-272.4)  - Target < 130 ldl pos fm hx, hbp  OBESITY  - Target wt = 178 for BMI < 30  ASTHMA (ICD-493.90) ([name] not active)  -Xolair s 8/05 > 10/2006  -Mastered HFA December 30, 2008  ALLERGIC RHINITIS (ICD-477.9)  S/p cervical fusion 2000  ............................................................................ [facility]...........................................................................[facility] KitchenWert  - Td 05/20/2015  - Pneumovax 09/2003 and October 27, 2009  Prevnar 05/07/2014  - Colonscopy 10/28/2004  - CPX 11/19/13     - GYN per [name]'s office -Mammogram 2013 , 2014  MED CALENDAR 05/05/11 , 05/16/2013 , 08/06/2014 , 01/28/2015 , 08/26/2015        Objective:   Physical Exam  Wt 143 11/09/2011 >  04/12/2012  140 > 06/26/2012  139 >  11/13/2012 135 >  137 02/12/2013 >141 05/16/2013 > 07/31/2013  139 >  11/19/2013  134 >135 02/18/2014 > 04/16/2014 134 > 05/07/2014  134 >131 08/06/2014 > 10/30/2014 130 > 11/28/2014 128 > 02/18/2015   133 > 05/20/2015 133 > 07/14/2015   135 >137 08/26/2015 > 11/25/2015 143 > 12/29/2015  137 > 02/23/2016   131 > 06/10/2016   148 > 07/08/2016   148 > 10/06/2016 146 > 04/11/2017  140 >  11/24/2017  136    amb bf nad   Vital signs reviewed - Note on arrival 02 sats  97% on RA        HEENT: nl  turbinates bilaterally, and oropharynx. Nl external ear canals without cough reflex - top dentures    NECK :  without JVD/Nodes/TM/ nl carotid upstrokes bilaterally   LUNGS: no acc muscle use,  Nl contour chest which is clear to A and P bilaterally without cough on insp or exp maneuvers   CV:  RRR  no s3 or murmur or increase in P2, and no edema   ABD:  soft and nontender with nl inspiratory excursion in the supine position. No bruits or organomegaly appreciated, bowel sounds nl  MS:  Nl gait/ ext warm without deformities, calf tenderness, cyanosis or clubbing No obvious joint restrictions   SKIN: warm and dry without lesions    NEURO:  alert, approp, nl sensorium with  no motor or cerebellar deficits apparent.        Labs ordered/ reviewed:      Chemistry      Component Value Date/Time   NA 140 11/24/2017 0922   K 3.6 11/24/2017 0922   CL 103 11/24/2017 0922   CO2 28 11/24/2017 0922   BUN 11  11/24/2017 0922   CREATININE 0.71 11/24/2017 0922   CREATININE 0.71 10/04/2016 1029      Component Value Date/Time   CALCIUM 9.3 11/24/2017 0922                             Lab Results  Component Value Date   WBC 8.3 11/24/2017   HGB 12.0 11/24/2017   HCT 35.7 (L) 11/24/2017   MCV 87.0 11/24/2017   PLT 506.0 (H) 11/24/2017            Assessment & Plan:

## 2017-11-24 NOTE — Patient Instructions (Addendum)
Please remember to go to the lab department downstairs in the basement  for your tests - we will call you with the results when they are available.      Please schedule a follow up visit in 3 months but call sooner if needed to See Cassie Campbell on return

## 2017-11-25 ENCOUNTER — Ambulatory Visit: Payer: Medicare HMO | Admitting: Internal Medicine

## 2017-11-25 DIAGNOSIS — Z1231 Encounter for screening mammogram for malignant neoplasm of breast: Secondary | ICD-10-CM | POA: Diagnosis not present

## 2017-11-25 DIAGNOSIS — Z803 Family history of malignant neoplasm of breast: Secondary | ICD-10-CM | POA: Diagnosis not present

## 2017-11-27 ENCOUNTER — Encounter: Payer: Self-pay | Admitting: Internal Medicine

## 2017-11-27 NOTE — Assessment & Plan Note (Signed)
Adequate control on present rx, reviewed in detail with pt > no change in rx needed   

## 2017-11-27 NOTE — Assessment & Plan Note (Signed)
-  Xolair rx  07/2004 > 10/2006  - IgE  296 11/20/2013  - Prevnar rx 05/07/14  - Spirometry 06/10/2016  FEV1 1.06 (80%)  Ratio 57 - NO   06/10/2016  = 15   - 06/10/2016  After extensive coaching HFA effectiveness =    75%  - IgE  06/10/16  293  - FENO 07/08/2016  =   9   - 07/08/2016 instructed on dpi > changed to arnuity 100 qam due to insurance > try off 10/06/2016   -  11/24/2017  After extensive coaching HFA effectiveness =    90% from a baseline of 75%   All goals of chronic asthma control met including optimal function and elimination of symptoms with minimal need for rescue therapy.  Contingencies discussed in full including contacting this office immediately if not controlling the symptoms using the rule of two's.       Each maintenance medication was reviewed in detail including most importantly the difference between maintenance and as needed and under what circumstances the prns are to be used. This was done in the context of a medication calendar review which provided the patient with a user-friendly unambiguous mechanism for medication administration and reconciliation and provides an action plan for all active problems. It is critical that this be shown to every doctor  for modification during the office visit if necessary so the patient can use it as a working document.

## 2017-11-29 ENCOUNTER — Emergency Department (HOSPITAL_COMMUNITY): Payer: Medicare HMO

## 2017-11-29 ENCOUNTER — Other Ambulatory Visit: Payer: Self-pay

## 2017-11-29 ENCOUNTER — Emergency Department (HOSPITAL_COMMUNITY)
Admission: EM | Admit: 2017-11-29 | Discharge: 2017-11-29 | Disposition: A | Discharge status: Home / Self Care | Payer: Medicare HMO | Attending: Emergency Medicine | Admitting: Emergency Medicine

## 2017-11-29 ENCOUNTER — Encounter (HOSPITAL_COMMUNITY): Payer: Self-pay | Admitting: Emergency Medicine

## 2017-11-29 DIAGNOSIS — J45909 Unspecified asthma, uncomplicated: Secondary | ICD-10-CM | POA: Diagnosis not present

## 2017-11-29 DIAGNOSIS — E785 Hyperlipidemia, unspecified: Secondary | ICD-10-CM | POA: Diagnosis not present

## 2017-11-29 DIAGNOSIS — I10 Essential (primary) hypertension: Secondary | ICD-10-CM | POA: Diagnosis not present

## 2017-11-29 DIAGNOSIS — I2510 Atherosclerotic heart disease of native coronary artery without angina pectoris: Secondary | ICD-10-CM | POA: Diagnosis not present

## 2017-11-29 DIAGNOSIS — R072 Precordial pain: Secondary | ICD-10-CM | POA: Diagnosis not present

## 2017-11-29 DIAGNOSIS — R0789 Other chest pain: Secondary | ICD-10-CM | POA: Diagnosis not present

## 2017-11-29 DIAGNOSIS — R079 Chest pain, unspecified: Secondary | ICD-10-CM | POA: Diagnosis not present

## 2017-11-29 DIAGNOSIS — Z79899 Other long term (current) drug therapy: Secondary | ICD-10-CM | POA: Insufficient documentation

## 2017-11-29 DIAGNOSIS — I252 Old myocardial infarction: Secondary | ICD-10-CM | POA: Insufficient documentation

## 2017-11-29 DIAGNOSIS — Z7901 Long term (current) use of anticoagulants: Secondary | ICD-10-CM | POA: Insufficient documentation

## 2017-11-29 LAB — CBC
HCT: 34.9 % — ABNORMAL LOW (ref 36.0–46.0)
HEMOGLOBIN: 11.5 g/dL — AB (ref 12.0–15.0)
MCH: 28.7 pg (ref 26.0–34.0)
MCHC: 33 g/dL (ref 30.0–36.0)
MCV: 87 fL (ref 78.0–100.0)
PLATELETS: 447 10*3/uL — AB (ref 150–400)
RBC: 4.01 MIL/uL (ref 3.87–5.11)
RDW: 14.1 % (ref 11.5–15.5)
WBC: 9.2 10*3/uL (ref 4.0–10.5)

## 2017-11-29 LAB — BASIC METABOLIC PANEL
ANION GAP: 8 (ref 5–15)
BUN: 11 mg/dL (ref 6–20)
CALCIUM: 9.2 mg/dL (ref 8.9–10.3)
CO2: 26 mmol/L (ref 22–32)
CREATININE: 0.71 mg/dL (ref 0.44–1.00)
Chloride: 102 mmol/L (ref 101–111)
GFR calc Af Amer: >60 mL/min (ref >60)
GLUCOSE: 124 mg/dL — AB (ref 65–99)
Potassium: 3.1 mmol/L — ABNORMAL LOW (ref 3.5–5.1)
Sodium: 136 mmol/L (ref 135–145)

## 2017-11-29 LAB — I-STAT TROPONIN, ED
TROPONIN I, POC: 0 ng/mL (ref 0.00–0.08)
Troponin i, poc: 0 ng/mL (ref 0.00–0.08)

## 2017-11-29 MED ORDER — NITROGLYCERIN 0.4 MG SL SUBL: 0.4000 mg | SUBLINGUAL_TABLET | SUBLINGUAL | Status: DC | PRN

## 2017-11-29 MED ORDER — POTASSIUM CHLORIDE CRYS ER 20 MEQ PO TBCR
40.0000 meq | EXTENDED_RELEASE_TABLET | Freq: Once | ORAL | Status: AC
  Administered 2017-11-29: 40 meq via ORAL
  Filled 2017-11-29: qty 2

## 2017-11-29 MED ORDER — GI COCKTAIL ~~LOC~~
30.0000 mL | Freq: Once | ORAL | Status: AC
  Administered 2017-11-29: 30 mL via ORAL
  Filled 2017-11-29: qty 30

## 2017-11-29 NOTE — Discharge Instructions (Signed)

## 2017-11-29 NOTE — ED Triage Notes (Signed)
Pt brought to ED by [facility] from home for c/o 4/10 cp, pt having this pain intermittent for the past 3 days, taking nitroglycerin at home no relief today. Pt describe pain as a pressure radiating from center to left side and neck. Prior hx of MI, denies SOB, nausea or vomiting. Pt allergic to aspirin, got total of 2 nitro sl pta by ems.  VS  BP 120/85 HR 80 sPO2 96% RA SR with R BBB and frequent pvc on monitor.

## 2017-11-29 NOTE — ED Provider Notes (Signed)
[facility] EMERGENCY DEPARTMENT Provider Note   CSN: [id number] Arrival date & time: 11/29/17  0257     History   Chief Complaint Chief Complaint  Patient presents with  . Chest Pain    HPI Cassie Campbell is a 76 y.o. female.  The history is provided by the patient.  Chest Pain   This is a new problem. The current episode started more than 2 days ago. The problem occurs daily. The problem has been gradually improving. The pain is present in the substernal region. The pain is moderate. The quality of the pain is described as pressure-like. The pain radiates to the upper back. Associated symptoms include nausea. Pertinent negatives include no fever, no hemoptysis, no lower extremity edema, no shortness of breath and no vomiting. She has tried nitroglycerin for the symptoms. The treatment provided moderate relief. Risk factors include being elderly.  Her past medical history is significant for CAD.  Patient with known history of CAD presents with chest pain for the past 3 days. She reports episodes include chest pressure as well as pain in her upper back Reports this feels similar to prior episodes of anginal chest pain Currently, she just reports mild heartburn She reports her chest pain has improved with nitroglycerin at home but will return She normally does not have chest pain She reports an aspirin allergy There are no other acute complaints  Past Medical History:  Diagnosis Date  . Allergic rhinitis   . Arthritis   . Asthma    xolair s 8/05 ?11/07; mastered hfa 12/20/08  . Benign positional vertigo   . CAD (coronary artery disease)    a. 09/2014 NSTEMI s/p LHC with sig 2V dz. dLAD diffusely diseased and not suitable for PCI. unsuccessful RCA PCI d/t heavy calcifications  . Depression   . Dyspnea   . GERD (gastroesophageal reflux disease)   . Heart attack ([place])    09/2014  . Hyperlipidemia    <130 ldl pos fm hx, bp  . Hypertension   . Obesity     . Osteopenia    dexa 08/22/07 AP spine + 1.1, left femur -1.3, right femur -.8; dexa 10/06/09 +1.6, left femur =1.6, right femur -.  . PONV (postoperative nausea and vomiting)   . Ruptured disc, cervical   . 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 Postoperative anemia due to acute blood loss 08/16/2017    Class: Acute  . S/P lumbar fusion 08/15/2017  . Erroneous encounter - disregard 08/08/2017  . Gout of foot 04/11/2017  . Spinal stenosis of lumbar region 01/11/2017  . Dyspnea 06/10/2016  . Muscle strain of scapular region 01/22/2016  . CAD in native artery 10/28/2015  . Tinea cruris 07/20/2015  . Right bundle branch block 04/28/2015  . Pain in the chest   . Chest pain, atypical 11/06/2014  . HCAP (healthcare-associated pneumonia) 11/05/2014  . CAD (coronary artery disease)   . Chest pain 10/15/2014  . NSTEMI (non-ST elevated myocardial infarction) ([place]) 10/15/2014  . Health care maintenance 10/22/2013  . Arthralgia of hip 02/12/2013  . GERD (gastroesophageal reflux disease) 03/10/2012  . Intertriginous candidiasis 11/09/2011  . Atelectasis 02/24/2009  . BENIGN POSITIONAL VERTIGO 11/10/2007  . Hyperlipidemia with target LDL less than 70 10/05/2007  . Essential hypertension 10/05/2007  . Allergic rhinitis 10/05/2007  . Moderate persistent asthma in adult without complication 40/98/1191  . Disorder  of bone and cartilage 10/05/2007    Past Surgical History:  Procedure Laterality Date  . BREAST SURGERY  10-26-10   Rt. breast bx--for microcalcifications in rt. retroareolar region--dx was hyalinized fibroadenoma  . BUNIONECTOMY Bilateral   . CARDIAC CATHETERIZATION     2015  . [place] SURGERY  12/01  . LEFT HEART CATHETERIZATION WITH CORONARY ANGIOGRAM N/A 10/16/2014   Procedure: LEFT HEART CATHETERIZATION WITH CORONARY ANGIOGRAM;  Surgeon: Blane Ohara, MD;  Location: [facility] CATH LAB;   Service: Cardiovascular;  Laterality: N/A;  . PERCUTANEOUS CORONARY ROTOBLATOR INTERVENTION (PCI-R) N/A 10/17/2014   Procedure: PERCUTANEOUS CORONARY ROTOBLATOR INTERVENTION (PCI-R);  Surgeon: Troy Sine, MD;  Location: [facility] CATH LAB;  Service: Cardiovascular;  Laterality: N/A;  . VEIN LIGATION AND STRIPPING    . VIDEO BRONCHOSCOPY Bilateral 02/05/2015   Procedure: VIDEO BRONCHOSCOPY WITHOUT FLUORO;  Surgeon: Tanda Rockers, MD;  Location: [facility] ENDOSCOPY;  Service: Endoscopy;  Laterality: Bilateral;  . VULVA /PERINEUM BIOPSY  12-30-10   --epidermoid cyst    OB History    Gravida Para Term Preterm AB Living   2 1 1   1 1    SAB TAB Ectopic Multiple Live Births   1               Home Medications    Prior to Admission medications   Medication Sig Start Date End Date Taking? Authorizing Provider  amLODipine (NORVASC) 10 MG tablet TAKE 1 TABLET BY MOUTH EVERY DAY 01/17/17   Tanda Rockers, MD  atorvastatin (LIPITOR) 80 MG tablet TAKE 1 TABLET(80 MG) BY MOUTH DAILY 09/09/17   Troy Sine, MD  bisoprolol (ZEBETA) 5 MG tablet Take 1 tablet (5 mg total) by mouth daily. 04/11/17   Tanda Rockers, MD  budesonide-formoterol (SYMBICORT) 160-4.5 MCG/ACT inhaler Inhale 2 puffs into the lungs 2 (two) times daily. 11/24/17   Tanda Rockers, MD  celecoxib (CELEBREX) 200 MG capsule Take 200 mg by mouth as needed.    [provider]  cetirizine (ZYRTEC) 10 MG tablet Take 10 mg by mouth at bedtime as needed (itching and sneezing).     [provider]  Cholecalciferol (VITAMIN D3) 2000 units TABS Take 2,000 Units by mouth daily.    [provider]  cholecalciferol 2000 units TABS Take 1 tablet (2,000 Units total) by mouth daily. 08/18/17   Jessy Oto, MD  clopidogrel (PLAVIX) 75 MG tablet TAKE 1 TABLET BY MOUTH EVERY DAY 01/17/17   Tanda Rockers, MD  clotrimazole-betamethasone (LOTRISONE) cream Apply 1 application topically as needed (itching). Use as directed for rash     [provider]  dextromethorphan-guaiFENesin (MUCINEX DM) 30-600 MG per 12 hr tablet Take 1-2 tablets by mouth every 12 (twelve) hours as needed for cough (with flutter).     [provider]  ezetimibe (ZETIA) 10 MG tablet TAKE 1 TABLET(10 MG) BY MOUTH DAILY 09/26/17   Troy Sine, MD  famotidine (PEPCID) 20 MG tablet Take 20 mg by mouth at bedtime.     [provider]  fluticasone (FLONASE) 50 MCG/ACT nasal spray Place 2 sprays into both nostrils 2 (two) times daily as needed for allergies or rhinitis.     [provider]  furosemide (LASIX) 20 MG tablet Take 1 tablet (20 mg total) by mouth daily. 04/11/17   Tanda Rockers, MD  isosorbide mononitrate (IMDUR) 60 MG 24 hr tablet TAKE 1 AND 1/2 TABLETS BY MOUTH EVERY MORNING AND 1/2 TABLET EVERY  EVENING 09/26/17   Troy Sine, MD  meclizine (ANTIVERT) 25 MG tablet TAKE 2 TABLETS BY MOUTH THREE TIMES DAILY AS NEEDED Patient taking differently: TAKE 2 TABLETS BY MOUTH THREE TIMES DAILY AS NEEDED FOR DIZZNESS 04/08/17   Tanda Rockers, MD  montelukast (SINGULAIR) 10 MG tablet TAKE 1 TABLET(10 MG) BY MOUTH AT BEDTIME 09/26/17   Tanda Rockers, MD  Multiple Vitamin (MULTIVITAMIN) capsule Take 1 capsule by mouth daily.     [provider]  nitroGLYCERIN (NITROSTAT) 0.4 MG SL tablet Place 0.4 mg under the tongue every 5 (five) minutes as needed for chest pain (may repeat x3).    [provider]  omeprazole (PRILOSEC) 20 MG capsule Take 20 mg by mouth daily before breakfast.     [provider]  oxymetazoline (AFRIN) 0.05 % nasal spray Place 2 sprays into both nostrils 2 (two) times daily as needed for congestion. as needed for stuffy nose and sinus problems    [provider]  PROAIR HFA 108 (90 Base) MCG/ACT inhaler INHALE 1 TO 2 PUFFS BY MOUTH EVERY 4 HOURS AS NEEDED FOR WHEEZING 02/10/17   Tanda Rockers, MD  RANEXA 500 MG 12 hr tablet TAKE 1 TABLET(500 MG) BY MOUTH TWICE DAILY  10/17/17   Troy Sine, MD  sodium chloride (OCEAN) 0.65 % nasal spray Place 2 sprays into the nose every 4 (four) hours as needed (nasal congestion).     [provider]    Family History Family History  Problem Relation Age of Onset  . Diabetes Mother        AODM  . Stroke Mother   . Hypertension Mother   . Heart disease Father   . Diabetes Sister        AODM  . Breast cancer Sister   . Hypertension Sister   . Breast cancer Daughter 2       dec--mets to liver/spine    Social History Social History   Tobacco Use  . Smoking status: Never Smoker  . Smokeless tobacco: Never Used  Substance Use Topics  . Alcohol use: No    Alcohol/week: 0.0 oz  . Drug use: No     Allergies   Fish-derived products; Penicillins; Aspirin; Brilinta [ticagrelor]; Codeine phosphate; and Diphenhydramine hcl   Review of Systems Review of Systems  Constitutional: Negative for fever.  Respiratory: Negative for hemoptysis and shortness of breath.   Cardiovascular: Positive for chest pain. Negative for leg swelling.  Gastrointestinal: Positive for nausea. Negative for vomiting.  All other systems reviewed and are negative.    Physical Exam Updated Vital Signs BP 118/79   Pulse 80   Temp 98.3 F (36.8 C) (Oral)   Resp 10   Ht 1.499 m (4\' 11" )   Wt 61.2 kg (135 lb)   LMP 12/21/1999   SpO2 95%   BMI 27.27 kg/m   Physical Exam CONSTITUTIONAL: Well developed/well nourished, no acute distress HEAD: Normocephalic/atraumatic EYES: EOMI/PERRL ENMT: Mucous membranes moist NECK: supple no meningeal signs SPINE/BACK:entire spine nontender CV: S1/S2 noted, no murmurs/rubs/gallops noted LUNGS: Lungs are clear to auscultation bilaterally, no apparent distress ABDOMEN: soft, nontender, no rebound or guarding, bowel sounds noted throughout abdomen GU:no cva tenderness NEURO: Pt is awake/alert/appropriate, moves all extremitiesx4.  No facial droop.   EXTREMITIES: pulses  normal/equal x4, full ROM, no calf tenderness or edema noted SKIN: warm, color normal PSYCH: no abnormalities of mood noted, alert and oriented to situation   ED Treatments / Results  Labs (all labs ordered are listed, but only abnormal results are displayed) Labs Reviewed  BASIC METABOLIC PANEL - Abnormal; Notable for the following components:      Result Value   Potassium 3.1 (*)    Glucose, Bld 124 (*)    All other components within normal limits  CBC - Abnormal; Notable for the following components:   Hemoglobin 11.5 (*)    HCT 34.9 (*)    Platelets 447 (*)    All other components within normal limits  I-STAT TROPONIN, ED  I-STAT TROPONIN, ED    EKG  EKG Interpretation  Date/Time:  Tuesday November 29 2017 03:06:36 EST Ventricular Rate:  81 PR Interval:    QRS Duration: 131 QT Interval:  421 QTC Calculation: 489 R Axis:   -84 Text Interpretation:  Sinus rhythm RBBB and LAFB No significant change since last tracing Confirmed by Ripley Fraise [phone]) on 11/29/2017 4:09:05 AM       Radiology Dg Chest 2 View  Result Date: 11/29/2017 CLINICAL DATA:  Acute onset of central chest pressure, radiating to the left side and neck. EXAM: CHEST  2 VIEW COMPARISON:  Chest radiograph performed 07/26/2017, and CTA of the chest performed 06/11/2016 FINDINGS: The lungs are well-aerated and clear. There is no evidence of focal opacification, pleural effusion or pneumothorax. The heart is normal in size; the mediastinal contour is within normal limits. No acute osseous abnormalities are seen. Cervical spinal fusion hardware is noted. IMPRESSION: No acute cardiopulmonary process seen. Electronically Signed   By: Garald Balding M.D.   On: 11/29/2017 04:28    Procedures Procedures  Medications Ordered in ED Medications  potassium chloride SA (K-DUR,KLOR-CON) CR tablet 40 mEq (40 mEq Oral Given 11/29/17 0425)  gi cocktail (Maalox,Lidocaine,Donnatal) (30 mLs Oral Given 11/29/17 4008)       Initial Impression / Assessment and Plan / ED Course  I have reviewed the triage vital signs and the nursing notes.  Pertinent labs & imaging results that were available during my care of the patient were reviewed by me and considered in my medical decision making (see chart for details).      4:12 AM Patient in the ED for chest pain over the past 3 days She reports that she is essentially pain-free She has complex medical history including CAD that was not amenable to intervention She will likely benefit from admission and cardiology consultation 5:27 AM Patient reports she feels improved. I discussed my recommendations of admission, monitoring, cardiology consultation After discussion of risks and benefits, patient would like to go home She tells me she is willing to stay around for a repeat troponin at the third hour She  would like to be discharged, and will follow up with her cardiologist Dr. Claiborne Billings as soon as possible 5:48 AM She reports return of symptoms, but thinks it is acid reflux She does not want to try nitroglycerin, she would like to try something else We will give her a GI cocktail 7:03 AM Patient reports all of her symptoms are resolved Her repeat troponin was negative Patient would like to be discharged I again offered admission, but she prefers to go home She will call her cardiologist today for close follow-up Encouraged her to continue all of her home medication We discussed strict ER return precautions Final Clinical Impressions(s) / ED Diagnoses   Final diagnoses:  Precordial pain    ED Discharge Orders    None       Ripley Fraise, MD 11/29/17 [phone]

## 2017-12-10 ENCOUNTER — Other Ambulatory Visit: Payer: Self-pay | Admitting: Internal Medicine

## 2017-12-10 ENCOUNTER — Other Ambulatory Visit: Payer: Self-pay | Admitting: Cardiovascular Disease

## 2018-01-02 ENCOUNTER — Other Ambulatory Visit (INDEPENDENT_AMBULATORY_CARE_PROVIDER_SITE_OTHER): Payer: Self-pay | Admitting: Specialist

## 2018-01-02 NOTE — Telephone Encounter (Signed)
Celecoxib Refill request  

## 2018-01-10 DIAGNOSIS — H2513 Age-related nuclear cataract, bilateral: Secondary | ICD-10-CM | POA: Diagnosis not present

## 2018-01-10 DIAGNOSIS — H25013 Cortical age-related cataract, bilateral: Secondary | ICD-10-CM | POA: Diagnosis not present

## 2018-01-10 DIAGNOSIS — H524 Presbyopia: Secondary | ICD-10-CM | POA: Diagnosis not present

## 2018-01-10 DIAGNOSIS — H43813 Vitreous degeneration, bilateral: Secondary | ICD-10-CM | POA: Diagnosis not present

## 2018-01-15 ENCOUNTER — Other Ambulatory Visit: Payer: Self-pay | Admitting: Internal Medicine

## 2018-02-01 ENCOUNTER — Other Ambulatory Visit: Payer: Self-pay | Admitting: Internal Medicine

## 2018-02-02 HISTORY — PX: CATARACT EXTRACTION: SUR2

## 2018-02-09 ENCOUNTER — Ambulatory Visit (INDEPENDENT_AMBULATORY_CARE_PROVIDER_SITE_OTHER): Payer: Medicare Other | Admitting: Specialist

## 2018-02-09 ENCOUNTER — Ambulatory Visit (INDEPENDENT_AMBULATORY_CARE_PROVIDER_SITE_OTHER): Payer: Self-pay

## 2018-02-09 ENCOUNTER — Encounter (INDEPENDENT_AMBULATORY_CARE_PROVIDER_SITE_OTHER): Payer: Self-pay | Admitting: Specialist

## 2018-02-09 VITALS — BP 119/61 | HR 81 | Ht 59.0 in | Wt 140.0 lb

## 2018-02-09 DIAGNOSIS — Z981 Arthrodesis status: Secondary | ICD-10-CM | POA: Diagnosis not present

## 2018-02-09 NOTE — Patient Instructions (Signed)
Avoid frequent bending and stooping  No lifting greater than 15-20 lbs. May use ice or moist heat for pain. Weight loss is of benefit. Handicap license is approved.

## 2018-02-09 NOTE — Progress Notes (Signed)
Office Visit Note   Patient: Cassie Campbell           Date of Birth: 24-Dec-1940           MRN: [id number] Visit Date: 02/09/2018              Requested by: Tanda Rockers, MD [address], [place] [address] PCP: Tanda Rockers, MD   Assessment & Plan: Visit Diagnoses:  1. Status post lumbar spinal fusion   2. S/P lumbar fusion     Plan:Avoid frequent bending and stooping  No lifting greater than 15-20 lbs. May use ice or moist heat for pain. Weight loss is of benefit. Handicap license is approved.  Follow-Up Instructions: No Follow-up on file.   Orders:  Orders Placed This Encounter  Procedures  . XR Lumbar Spine 2-3 Views   No orders of the defined types were placed in this encounter.     Procedures: No procedures performed   Clinical Data: No additional findings.   Subjective: Chief Complaint  Patient presents with  . Lower Back - Follow-up    HPI  Review of Systems   Objective: Vital Signs: BP 119/61 (BP Location: Left Arm, Patient Position: Sitting)   Pulse 81   Ht 4\' 11"  (1.499 m)   Wt 140 lb (63.5 kg)   LMP 12/21/1999   BMI 28.28 kg/m   Physical Exam  Ortho Exam  Specialty Comments:  No specialty comments available.  Imaging: Xr Lumbar Spine 2-3 Views  Result Date: 02/09/2018 AP and lateral flexion and extension radiographs show no significant motion at the L4-5 level. Hardware and cage intact with the posterior cage at the border of the lower L5 posterior vertebral body. No change in alignment. Difference in measurement in flexion and extension is .7 mm.    PMFS History: Patient Active Problem List   Diagnosis Date Noted  . Postoperative anemia due to acute blood loss 08/16/2017    Priority: Medium    Class: Acute  . S/P lumbar fusion 08/15/2017  . Erroneous encounter - disregard 08/08/2017  . Gout of foot 04/11/2017  . Spinal stenosis of lumbar region 01/11/2017  . Dyspnea 06/10/2016  . Muscle strain of  scapular region 01/22/2016  . CAD in native artery 10/28/2015  . Tinea cruris 07/20/2015  . Right bundle branch block 04/28/2015  . Pain in the chest   . Chest pain, atypical 11/06/2014  . HCAP (healthcare-associated pneumonia) 11/05/2014  . CAD (coronary artery disease)   . Chest pain 10/15/2014  . NSTEMI (non-ST elevated myocardial infarction) ([place]) 10/15/2014  . Health care maintenance 10/22/2013  . Arthralgia of hip 02/12/2013  . GERD (gastroesophageal reflux disease) 03/10/2012  . Intertriginous candidiasis 11/09/2011  . Atelectasis 02/24/2009  . BENIGN POSITIONAL VERTIGO 11/10/2007  . Hyperlipidemia with target LDL less than 70 10/05/2007  . Essential hypertension 10/05/2007  . Allergic rhinitis 10/05/2007  . Moderate persistent asthma in adult without complication 84/13/2440  . Disorder of bone and cartilage 10/05/2007   Past Medical History:  Diagnosis Date  . Allergic rhinitis   . Arthritis   . Asthma    xolair s 8/05 ?11/07; mastered hfa 12/20/08  . Benign positional vertigo   . CAD (coronary artery disease)    a. 09/2014 NSTEMI s/p LHC with sig 2V dz. dLAD diffusely diseased and not suitable for PCI. unsuccessful RCA PCI d/t heavy calcifications  . Depression   . Dyspnea   . GERD (gastroesophageal reflux disease)   .  Heart attack ([place])    09/2014  . Hyperlipidemia    <130 ldl pos fm hx, bp  . Hypertension   . Obesity   . Osteopenia    dexa 08/22/07 AP spine + 1.1, left femur -1.3, right femur -.8; dexa 10/06/09 +1.6, left femur =1.6, right femur -.  . PONV (postoperative nausea and vomiting)   . Ruptured disc, cervical   . 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 Diabetes Mother        AODM  . Stroke Mother   . Hypertension Mother   . Heart disease Father   . Diabetes Sister        AODM  . Breast cancer Sister   . Hypertension Sister   . Breast cancer Daughter 76        dec--mets to liver/spine    Past Surgical History:  Procedure Laterality Date  . BREAST SURGERY  10-26-10   Rt. breast bx--for microcalcifications in rt. retroareolar region--dx was hyalinized fibroadenoma  . BUNIONECTOMY Bilateral   . CARDIAC CATHETERIZATION     2015  . [place] SURGERY  12/01  . LEFT HEART CATHETERIZATION WITH CORONARY ANGIOGRAM N/A 10/16/2014   Procedure: LEFT HEART CATHETERIZATION WITH CORONARY ANGIOGRAM;  Surgeon: Blane Ohara, MD;  Location: [facility] CATH LAB;  Service: Cardiovascular;  Laterality: N/A;  . PERCUTANEOUS CORONARY ROTOBLATOR INTERVENTION (PCI-R) N/A 10/17/2014   Procedure: PERCUTANEOUS CORONARY ROTOBLATOR INTERVENTION (PCI-R);  Surgeon: Troy Sine, MD;  Location: [facility] CATH LAB;  Service: Cardiovascular;  Laterality: N/A;  . VEIN LIGATION AND STRIPPING    . VIDEO BRONCHOSCOPY Bilateral 02/05/2015   Procedure: VIDEO BRONCHOSCOPY WITHOUT FLUORO;  Surgeon: Tanda Rockers, MD;  Location: [facility] ENDOSCOPY;  Service: Endoscopy;  Laterality: Bilateral;  . VULVA /PERINEUM BIOPSY  12-30-10   --epidermoid cyst   Social History   Occupational History  . Occupation: retired Tour manager  Tobacco Use  . Smoking status: Never Smoker  . Smokeless tobacco: Never Used  Substance and Sexual Activity  . Alcohol use: No    Alcohol/week: 0.0 oz  . Drug use: No  . Sexual activity: No    Partners: Male    Birth control/protection: Post-menopausal

## 2018-02-19 ENCOUNTER — Other Ambulatory Visit: Payer: Self-pay | Admitting: Internal Medicine

## 2018-03-08 ENCOUNTER — Encounter: Payer: Self-pay | Admitting: Adult Health

## 2018-03-08 ENCOUNTER — Ambulatory Visit (INDEPENDENT_AMBULATORY_CARE_PROVIDER_SITE_OTHER): Payer: Medicare Other | Admitting: Adult Health

## 2018-03-08 VITALS — BP 122/74 | HR 77 | Ht 59.0 in | Wt 137.4 lb

## 2018-03-08 DIAGNOSIS — J4540 Moderate persistent asthma, uncomplicated: Secondary | ICD-10-CM | POA: Diagnosis not present

## 2018-03-08 DIAGNOSIS — E785 Hyperlipidemia, unspecified: Secondary | ICD-10-CM | POA: Diagnosis not present

## 2018-03-08 DIAGNOSIS — I2510 Atherosclerotic heart disease of native coronary artery without angina pectoris: Secondary | ICD-10-CM

## 2018-03-08 DIAGNOSIS — M48061 Spinal stenosis, lumbar region without neurogenic claudication: Secondary | ICD-10-CM | POA: Diagnosis not present

## 2018-03-08 DIAGNOSIS — Z0000 Encounter for general adult medical examination without abnormal findings: Secondary | ICD-10-CM

## 2018-03-08 DIAGNOSIS — I10 Essential (primary) hypertension: Secondary | ICD-10-CM

## 2018-03-08 DIAGNOSIS — J302 Other seasonal allergic rhinitis: Secondary | ICD-10-CM | POA: Diagnosis not present

## 2018-03-08 MED ORDER — BUDESONIDE-FORMOTEROL FUMARATE 160-4.5 MCG/ACT IN AERO: 2.0000 | INHALATION_SPRAY | Freq: Two times a day (BID) | RESPIRATORY_TRACT | 0 refills | Status: DC

## 2018-03-08 NOTE — Assessment & Plan Note (Signed)
Doing very well since back surgery /pain-free

## 2018-03-08 NOTE — Assessment & Plan Note (Signed)
Controlled on current regimen Low-salt diet

## 2018-03-08 NOTE — Assessment & Plan Note (Signed)
Continue on statin therapy.  Low-fat diet.  Fasting labs with lipid panel

## 2018-03-08 NOTE — Assessment & Plan Note (Signed)
Continue on current regimen follow-up with cardiology next month as planned

## 2018-03-08 NOTE — Assessment & Plan Note (Signed)
Well controlled on current regimen.  No changes.  

## 2018-03-08 NOTE — Addendum Note (Signed)
Addended by: Parke Poisson E on: 03/08/2018 12:28 PM   Modules accepted: Orders

## 2018-03-08 NOTE — Patient Instructions (Signed)
Continue on current regimen .  Continue on low fat cholesterol diet  Low salt diet .  Return for fasting labs.  Set up Bone Density .  Follow up Dr. Melvyn Novas  In 6 months and As needed

## 2018-03-08 NOTE — Assessment & Plan Note (Signed)
Vaccines up to date.  Did discuss shingles vaccine which she will check with her pharmacy about. Mammogram is up-to-date. Check bone density Continue follow-up with GYN Patient request hepatitis C screening as she had been told that she was not a candidate for

## 2018-03-08 NOTE — Progress Notes (Signed)
@Patient  ID: Cassie Campbell, female    DOB: 01-Jun-1941, 77 y.o.   MRN: [id number]  Chief Complaint  Patient presents with  . 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 Follow in pulmonary clinic also for primary care with hbp/ hyperlipidemia complicated by MI (CAD ) 10/15/14 and persistent mucus plug in RML/ RLL s/p fob 02/05/15 with marked improvement p lavage and no endobrchial lesions, just diffuse airway swelling .   TEST  BENIGN POSITIONAL VERTIGO (ICD-386.11)  OSTEOPENIA (ICD-733.90)  - DEXA 08/22/07 AP spine + 1.1, left femur -1.3, right femur -.8  - DEXA 10/06/09 + 1.6 left femur -1.6, right femur -.5  -DEXA 2014 no change  HYPERTENSION (ICD-401.9)  HYPERLIPIDEMIA (ICD-272.4)  - Target <100ldl pos fm hx, hbp , MI  CAD -MI 2015 , 09/2014 NSTEMI s/p LHC with sig 2V dz. dLAD diffusely diseased and not suitable for PCI. unsuccessful RCA PCI d/t heavy calcifications OBESITY  - Target wt = 178 for BMI <30  ASTHMA (ICD-493.90) ([name] not active)  -Xolair s 8/05 >10/2006  -Mastered HFA December 30, 2008  ALLERGIC RHINITIS (ICD-477.9)  S/p cervical fusion 2000 ............................................................................ [place]...........................................................................[place]  - Td 05/20/2015  - Pneumovax 09/2003 and October 27, 2009 Prevnar 05/07/2014  - Colonscopy 10/28/2004  - CPX 11/19/13  - GYN per [name]'s office -Mammogram 2013 , 2014  MED CALENDAR 05/05/11 , 05/16/2013 , 08/06/2014 , 01/28/2015 , 08/26/2015 01/11/2017    03/08/2018 Follow up ; Asthma/HTN/Hyperlidpidemia /CAD Patient returns for a six-month follow-up.  Patient has underlying severe chronic asthma.  She continues on Symbicort and  Singulair. Spirometry August 2018 showed stable lung function with mild airflow obstruction essentially unchanged from 2017 She says overall breathing has been doing the same.  She denies any increased wheezing or albuterol use. Has noticed increased nasal drainage over the last couple weeks. Discussed preventive meds with zyrtec and flonase.   Patient has chronic back pain she is followed by orthopedics for spinal stenosis. She underwent back surgery in August 2018.  Says she is recovering well. She is so excited she is pain free. No having to use any pain meds.   Patient has hypertension.  Blood pressure has been well controlled on Norvasc.and bisoprolol .   Patient has history of coronary disease with previous MI.  She is followed by cardiology. She remains on isosorbide.  And Plavix. Had an episode of chest pain in December went to the emergency room with negative workup. Says it was GERD .   Patient has hyperlipidemia on Lipitor.  She is due for fasting labs.she will return this week.   Patient has GERD on Prilosec and Pepcid.   Immunizations are up-to-date with Prevnar, Pneumovax, Tdap and influenza Discussed shingles vaccine. She will check with her pharmacy .  Mammogram December 2018 was negative. Last colon 2005 , was recommended not have per cardiology . Discussed that she should check to see if it was okay to set up screening .  Due for bone density .       Allergies  Allergen Reactions  . Fish-Derived Products Shortness Of Breath, Swelling and Rash  . Penicillins Shortness Of Breath, Swelling and Rash    PATIENT HAS HAD A PCN REACTION WITH IMMEDIATE RASH, FACIAL/TONGUE/THROAT SWELLING, SOB, OR LIGHTHEADEDNESS WITH HYPOTENSION:  #  #  #  YES  #  #  #   Has patient had a PCN reaction causing severe rash involving mucus membranes or skin necrosis: No PATIENT HAS HAD A PCN REACTION THAT REQUIRED HOSPITALIZATION:  #  #  #  YES  #  #  #   Has patient had a PCN reaction  occurring within the last 10 years: No    . Aspirin Swelling and Rash    SWELLING REACTION UNSPECIFIED   . Brilinta [Ticagrelor] Rash    Started Brilinta 09/2014 - rash - unsure which it is directly related to  . Codeine Phosphate Nausea Only  . Diphenhydramine Hcl Rash    Immunization History  Administered Date(s) Administered  . Influenza Split 11/09/2011, 11/13/2012, 09/19/2014  . Influenza Whole 10/04/2007, 10/27/2009, 10/20/2010  . Influenza, High Dose Seasonal PF 10/06/2016, 10/04/2017  . Influenza,inj,Quad PF,6+ Mos 11/19/2013, 09/19/2014, 08/26/2015  . Pneumococcal Conjugate-13 05/07/2014  . Pneumococcal Polysaccharide-23 09/20/2003, 10/27/2009  . Tdap 09/19/2005, 05/20/2015    Past Medical History:  Diagnosis Date  . Allergic rhinitis   . Arthritis   . Asthma    xolair s 8/05 ?11/07; mastered hfa 12/20/08  . Benign positional vertigo   . CAD (coronary artery disease)    a. 09/2014 NSTEMI s/p LHC with sig 2V dz. dLAD diffusely diseased and not suitable for PCI. unsuccessful RCA PCI d/t heavy calcifications  . Depression   . Dyspnea   . GERD (gastroesophageal reflux disease)   . Heart attack ([place])    09/2014  . Hyperlipidemia    <130 ldl pos fm hx, bp  . Hypertension   . Obesity   . Osteopenia    dexa 08/22/07 AP spine + 1.1, left femur -1.3, right femur -.8; dexa 10/06/09 +1.6, left femur =1.6, right femur -.  . PONV (postoperative nausea and vomiting)   . Ruptured disc, cervical   . 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 amLODipine (NORVASC) 10 MG tablet TAKE 1 TABLET BY MOUTH EVERY DAY  . atorvastatin (LIPITOR) 80 MG tablet TAKE 1 TABLET(80 MG) BY MOUTH DAILY  . bisoprolol (ZEBETA) 5 MG tablet TAKE 1 TABLET(5 MG) BY  MOUTH DAILY  . budesonide-formoterol (SYMBICORT) 160-4.5 MCG/ACT inhaler Inhale 2 puffs into the lungs 2 (two) times daily.  . celecoxib (CELEBREX) 200 MG capsule TAKE ONE CAPSULE BY MOUTH EVERY 12 HOURS  . cetirizine (ZYRTEC) 10 MG tablet Take 10 mg by mouth at bedtime as needed (itching and sneezing).   . Cholecalciferol (VITAMIN D3) 2000 units TABS Take 2,000 Units by mouth daily.  . cholecalciferol 2000 units TABS Take 1 tablet (2,000 Units total) by mouth daily.  . clopidogrel (PLAVIX) 75 MG tablet TAKE 1 TABLET BY MOUTH EVERY DAY  . clotrimazole-betamethasone (LOTRISONE) cream Apply 1 application topically as needed (itching). Use as directed for rash  . dextromethorphan-guaiFENesin (MUCINEX DM) 30-600 MG per 12 hr tablet Take 1-2 tablets by mouth every 12 (twelve) hours as needed for cough (with flutter).   . ezetimibe (ZETIA) 10 MG tablet TAKE 1 TABLET(10 MG) BY MOUTH DAILY  . famotidine (PEPCID) 20 MG tablet Take 20 mg by mouth at bedtime.   . fluticasone (FLONASE) 50 MCG/ACT nasal spray Place 2 sprays into both nostrils 2 (two) times daily  as needed for allergies or rhinitis.   . furosemide (LASIX) 20 MG tablet TAKE 1 TABLET(20 MG) BY MOUTH DAILY  . isosorbide mononitrate (IMDUR) 60 MG 24 hr tablet TAKE 1 AND 1/2 TABLETS BY MOUTH EVERY MORNING AND 1/2 TABLET EVERY EVENING  . meclizine (ANTIVERT) 25 MG tablet TAKE 2 TABLETS BY MOUTH THREE TIMES DAILY AS NEEDED (Patient taking differently: TAKE 2 TABLETS BY MOUTH THREE TIMES DAILY AS NEEDED FOR DIZZNESS)  . montelukast (SINGULAIR) 10 MG tablet TAKE 1 TABLET(10 MG) BY MOUTH AT BEDTIME  . Multiple Vitamin (MULTIVITAMIN) capsule Take 1 capsule by mouth daily.   . nitroGLYCERIN (NITROSTAT) 0.4 MG SL tablet Place 0.4 mg under the tongue every 5 (five) minutes as needed for chest pain (may repeat x3).  Marland Kitchen omeprazole (PRILOSEC) 20 MG capsule Take 20 mg by mouth daily before breakfast.   . oxymetazoline (AFRIN) 0.05 % nasal spray Place 2 sprays  into both nostrils 2 (two) times daily as needed for congestion. as needed for stuffy nose and sinus problems  . PROAIR HFA 108 (90 Base) MCG/ACT inhaler INHALE 1 TO 2 PUFFS BY MOUTH EVERY 4 HOURS AS NEEDED FOR WHEEZING  . RANEXA 500 MG 12 hr tablet TAKE 1 TABLET(500 MG) BY MOUTH TWICE DAILY  . sodium chloride (OCEAN) 0.65 % nasal spray Place 2 sprays into the nose every 4 (four) hours as needed (nasal congestion).   . SYMBICORT 160-4.5 MCG/ACT inhaler INHALE 2 PUFFS BY MOUTH EVERY 12 HOURS  . [DISCONTINUED] celecoxib (CELEBREX) 200 MG capsule Take 200 mg by mouth as needed.   No facility-administered encounter medications on file as of 03/08/2018.      Review of Systems  Constitutional:   No  weight loss, night sweats,  Fevers, chills, fatigue, or  lassitude.  HEENT:   No headaches,  Difficulty swallowing,  Tooth/dental problems, or  Sore throat,                No sneezing, itching, ear ache,  +nasal congestion, post nasal drip,   CV:  No chest pain,  Orthopnea, PND, swelling in lower extremities, anasarca, dizziness, palpitations, syncope.   GI  No heartburn, indigestion, abdominal pain, nausea, vomiting, diarrhea, change in bowel habits, loss of appetite, bloody stools.   Resp:   No excess mucus, no productive cough,  No non-productive cough,  No coughing up of blood.  No change in color of mucus.  No wheezing.  No chest wall deformity  Skin: no rash or lesions.  GU: no dysuria, change in color of urine, no urgency or frequency.  No flank pain, no hematuria   MS:  No joint pain or swelling.  No decreased range of motion.  No back pain.    Physical Exam  BP 122/74 (BP Location: Left Arm, Cuff Size: Normal)   Pulse 77   Ht 4\' 11"  (1.499 m)   Wt 137 lb 6.4 oz (62.3 kg)   LMP 12/21/1999   SpO2 95%   BMI 27.75 kg/m   GEN: A/Ox3; pleasant , NAD, elderly    HEENT:  [place]/AT,  EACs-clear, TMs-wnl, NOSE-clear, THROAT-clear, no lesions, no postnasal drip or exudate noted.   NECK:   Supple w/ fair ROM; no JVD; normal carotid impulses w/o bruits; no thyromegaly or nodules palpated; no lymphadenopathy.    RESP  Clear  P & A; w/o, wheezes/ rales/ or rhonchi. no accessory muscle use, no dullness to percussion  CARD:  RRR, no m/r/g, no peripheral edema, pulses intact, no  cyanosis or clubbing.  GI:   Soft & nt; nml bowel sounds; no organomegaly or masses detected.   Musco: Warm bil, no deformities or joint swelling noted.   Neuro: alert, no focal deficits noted.    Skin: Warm, no lesions or rashes    Lab Results:  CBC    Component Value Date/Time   WBC 9.2 11/29/2017 0308   RBC 4.01 11/29/2017 0308   HGB 11.5 (L) 11/29/2017 0308   HCT 34.9 (L) 11/29/2017 0308   PLT 447 (H) 11/29/2017 0308   MCV 87.0 11/29/2017 0308   MCH 28.7 11/29/2017 0308   MCHC 33.0 11/29/2017 0308   RDW 14.1 11/29/2017 0308   LYMPHSABS 1.5 11/24/2017 0922   MONOABS 0.5 11/24/2017 0922   EOSABS 0.4 11/24/2017 0922   BASOSABS 0.0 11/24/2017 0922    BMET    Component Value Date/Time   NA 136 11/29/2017 0308   K 3.1 (L) 11/29/2017 0308   CL 102 11/29/2017 0308   CO2 26 11/29/2017 0308   GLUCOSE 124 (H) 11/29/2017 0308   GLUCOSE 109 (H) 10/05/2006 1022   BUN 11 11/29/2017 0308   CREATININE 0.71 11/29/2017 0308   CREATININE 0.71 10/04/2016 1029   CALCIUM 9.2 11/29/2017 0308   GFRNONAA >60 11/29/2017 0308   GFRAA >60 11/29/2017 0308    BNP No results found for: BNP  ProBNP    Component Value Date/Time   PROBNP 23.0 06/10/2016 1300    Imaging: Xr Lumbar Spine 2-3 Views  Result Date: 02/09/2018 AP and lateral flexion and extension radiographs show no significant motion at the L4-5 level. Hardware and cage intact with the posterior cage at the border of the lower L5 posterior vertebral body. No change in alignment. Difference in measurement in flexion and extension is .7 mm.    Assessment & Plan:   Allergic rhinitis May use Flonase and Zyrtec as needed  CAD  (coronary artery disease) Continue on current regimen follow-up with cardiology next month as planned  Essential hypertension Controlled on current regimen Low-salt diet  Hyperlipidemia with target LDL less than 70 Continue on statin therapy.  Low-fat diet.  Fasting labs with lipid panel  Moderate persistent asthma in adult without complication Well-controlled on current regimen No changes  Spinal stenosis of lumbar region Doing very well since back surgery /pain-free  Health care maintenance Vaccines up to date.  Did discuss shingles vaccine which she will check with her pharmacy about. Mammogram is up-to-date. Check bone density Continue follow-up with GYN Patient request hepatitis C screening as she had been told that she was not a candidate for      Rexene Edison, NP 03/08/2018

## 2018-03-08 NOTE — Assessment & Plan Note (Signed)
May use Flonase and Zyrtec as needed

## 2018-03-09 ENCOUNTER — Other Ambulatory Visit (INDEPENDENT_AMBULATORY_CARE_PROVIDER_SITE_OTHER): Payer: Medicare Other

## 2018-03-09 DIAGNOSIS — Z0000 Encounter for general adult medical examination without abnormal findings: Secondary | ICD-10-CM | POA: Diagnosis not present

## 2018-03-09 LAB — BASIC METABOLIC PANEL
BUN: 11 mg/dL (ref 6–23)
CALCIUM: 10.1 mg/dL (ref 8.4–10.5)
CO2: 27 meq/L (ref 19–32)
CREATININE: 0.79 mg/dL (ref 0.40–1.20)
Chloride: 100 mEq/L (ref 96–112)
GFR: 91.12 mL/min (ref >60.00)
GLUCOSE: 113 mg/dL — AB (ref 70–99)
Potassium: 3.7 mEq/L (ref 3.5–5.1)
Sodium: 138 mEq/L (ref 135–145)

## 2018-03-09 LAB — LIPID PANEL
CHOLESTEROL: 131 mg/dL (ref 0–200)
HDL: 46.5 mg/dL (ref >39.00)
LDL CALC: 63 mg/dL (ref 0–99)
NonHDL: 84.75
TRIGLYCERIDES: 109 mg/dL (ref 0.0–149.0)
Total CHOL/HDL Ratio: 3
VLDL: 21.8 mg/dL (ref 0.0–40.0)

## 2018-03-09 LAB — CBC WITH DIFFERENTIAL/PLATELET
BASOS ABS: 0 10*3/uL (ref 0.0–0.1)
Basophils Relative: 0.3 % (ref 0.0–3.0)
EOS ABS: 0.5 10*3/uL (ref 0.0–0.7)
Eosinophils Relative: 6.3 % — ABNORMAL HIGH (ref 0.0–5.0)
HCT: 33.6 % — ABNORMAL LOW (ref 36.0–46.0)
Hemoglobin: 11.6 g/dL — ABNORMAL LOW (ref 12.0–15.0)
LYMPHS ABS: 1.6 10*3/uL (ref 0.7–4.0)
Lymphocytes Relative: 20.4 % (ref 12.0–46.0)
MCHC: 34.4 g/dL (ref 30.0–36.0)
MCV: 80.9 fl (ref 78.0–100.0)
MONO ABS: 0.7 10*3/uL (ref 0.1–1.0)
Monocytes Relative: 8.2 % (ref 3.0–12.0)
NEUTROS ABS: 5.2 10*3/uL (ref 1.4–7.7)
NEUTROS PCT: 64.8 % (ref 43.0–77.0)
PLATELETS: 569 10*3/uL — AB (ref 150.0–400.0)
RBC: 4.16 Mil/uL (ref 3.87–5.11)
RDW: 16.5 % — ABNORMAL HIGH (ref 11.5–15.5)
WBC: 8.1 10*3/uL (ref 4.0–10.5)

## 2018-03-09 LAB — HEPATIC FUNCTION PANEL
ALK PHOS: 94 U/L (ref 39–117)
ALT: 15 U/L (ref 0–35)
AST: 20 U/L (ref 0–37)
Albumin: 4.4 g/dL (ref 3.5–5.2)
BILIRUBIN TOTAL: 0.5 mg/dL (ref 0.2–1.2)
Bilirubin, Direct: 0.2 mg/dL (ref 0.0–0.3)
Total Protein: 8.8 g/dL — ABNORMAL HIGH (ref 6.0–8.3)

## 2018-03-09 LAB — TSH: TSH: 2.14 u[IU]/mL (ref 0.35–4.50)

## 2018-03-09 NOTE — Progress Notes (Signed)
Chart and office note reviewed in detail  > agree with a/p as outlined    

## 2018-03-10 LAB — HEPATITIS C ANTIBODY
Hepatitis C Ab: NONREACTIVE
SIGNAL TO CUT-OFF: 0.06 (ref <1.00)

## 2018-03-14 ENCOUNTER — Ambulatory Visit (INDEPENDENT_AMBULATORY_CARE_PROVIDER_SITE_OTHER)
Admission: RE | Admit: 2018-03-14 | Discharge: 2018-03-14 | Disposition: A | Discharge status: Home / Self Care | Payer: Medicare Other | Source: Ambulatory Visit

## 2018-03-14 DIAGNOSIS — Z13820 Encounter for screening for osteoporosis: Secondary | ICD-10-CM

## 2018-03-14 DIAGNOSIS — Z0000 Encounter for general adult medical examination without abnormal findings: Secondary | ICD-10-CM

## 2018-03-18 ENCOUNTER — Other Ambulatory Visit: Payer: Self-pay | Admitting: Internal Medicine

## 2018-03-18 ENCOUNTER — Other Ambulatory Visit: Payer: Self-pay | Admitting: Cardiovascular Disease

## 2018-03-21 NOTE — Progress Notes (Signed)
Called spoke with patient, advised of Bone Density results / recs as stated by TP.  Pt verbalized her understanding and denied any questions.

## 2018-03-30 HISTORY — PX: CATARACT EXTRACTION: SUR2

## 2018-04-20 ENCOUNTER — Ambulatory Visit (INDEPENDENT_AMBULATORY_CARE_PROVIDER_SITE_OTHER): Payer: Medicare Other | Admitting: Obstetrics and Gynecology

## 2018-04-20 ENCOUNTER — Other Ambulatory Visit (HOSPITAL_COMMUNITY)
Admission: RE | Admit: 2018-04-20 | Discharge: 2018-04-20 | Disposition: A | Discharge status: Home / Self Care | Payer: Medicare Other | Source: Ambulatory Visit | Attending: Obstetrics and Gynecology | Admitting: Obstetrics and Gynecology

## 2018-04-20 ENCOUNTER — Ambulatory Visit: Payer: Medicare Other | Admitting: Obstetrics and Gynecology

## 2018-04-20 ENCOUNTER — Other Ambulatory Visit: Payer: Self-pay

## 2018-04-20 ENCOUNTER — Encounter: Payer: Self-pay | Admitting: Obstetrics and Gynecology

## 2018-04-20 VITALS — BP 142/78 | HR 80 | Resp 14 | Ht 59.0 in | Wt 134.0 lb

## 2018-04-20 DIAGNOSIS — Z01419 Encounter for gynecological examination (general) (routine) without abnormal findings: Secondary | ICD-10-CM

## 2018-04-20 NOTE — Progress Notes (Signed)
77 y.o. G11P1011 Married [place] American female here for annual exam.    No vaginal bleeding or spotting.  Occasional constipation which she controls with diet.   Noticing a change in a mole on her left cheek.  Sees Dr. Baltazar Najjar in [facility].  Had bilateral cataract surgery done. lab4   PCP:  Dr. Christinia Gully    Patient's last menstrual period was 12/21/1999.           Sexually active: No.  The current method of family planning is post menopausal status.    Exercising: Yes.    chair exercises Smoker:  no  Health Maintenance: Pap:  03-31-16 Neg History of abnormal Pap:  no MMG:  11/25/17 BIRADS 1 negative/density c Colonoscopy:  10/2004 normal with Dr. Fuller Plan and advised by Cardiologist not to have another one.  Does IFOBs. She will discuss coloscopy with Dr. Claiborne Billings, her cardiologist, in June.  BMD:   03/14/18  Result  Osteopenia.   TDaP:  05/20/15 Gardasil:   n/a HIV: never Hep C: 03/09/18 Negative Screening Labs:   PCP.   reports that she has never smoked. She has never used smokeless tobacco. She reports that she does not drink alcohol or use drugs.  Past Medical History:  Diagnosis Date  . Allergic rhinitis   . Arthritis   . Asthma    xolair s 8/05 ?11/07; mastered hfa 12/20/08  . Benign positional vertigo   . CAD (coronary artery disease)    a. 09/2014 NSTEMI s/p LHC with sig 2V dz. dLAD diffusely diseased and not suitable for PCI. unsuccessful RCA PCI d/t heavy calcifications  . Depression   . Dyspnea   . GERD (gastroesophageal reflux disease)   . Heart attack ([place])    09/2014  . Hyperlipidemia    <130 ldl pos fm hx, bp  . Hypertension   . Obesity   . Osteopenia    dexa 08/22/07 AP spine + 1.1, left femur -1.3, right femur -.8; dexa 10/06/09 +1.6, left femur =1.6, right femur -.  . PONV (postoperative nausea and vomiting)   . Ruptured disc, cervical   . 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 BREAST SURGERY  10-26-10   Rt. breast bx--for microcalcifications in rt. retroareolar region--dx was hyalinized fibroadenoma  . BUNIONECTOMY Bilateral   . CARDIAC CATHETERIZATION     2015  . CATARACT EXTRACTION Right 02/02/2018   Dr Satira Sark  . CATARACT EXTRACTION Left 03/30/2018  . [place] SURGERY  12/01  . LEFT HEART CATHETERIZATION WITH CORONARY ANGIOGRAM N/A 10/16/2014   Procedure: LEFT HEART CATHETERIZATION WITH CORONARY ANGIOGRAM;  Surgeon: Blane Ohara, MD;  Location[facility] CATH LAB;  Service: Cardiovascular;  Laterality: N/A;  . PERCUTANEOUS CORONARY ROTOBLATOR INTERVENTION (PCI-R) N/A 10/17/2014   Procedure: PERCUTANEOUS CORONARY ROTOBLATOR INTERVENTION (PCI-R);  Surgeon: Troy Sine, MD;  Location: [facility] CATH LAB;  Service: Cardiovascular;  Laterality: N/A;  . VEIN LIGATION AND STRIPPING    . VIDEO BRONCHOSCOPY Bilateral 02/05/2015   Procedure: VIDEO BRONCHOSCOPY WITHOUT FLUORO;  Surgeon: Tanda Rockers, MD;  Location: [facility] ENDOSCOPY;  Service: Endoscopy;  Laterality: Bilateral;  . VULVA /PERINEUM BIOPSY  12-30-10   --epidermoid cyst    Current Outpatient Medications  Medication Sig Dispense Refill  . amLODipine (NORVASC) 10 MG tablet TAKE 1 TABLET BY MOUTH EVERY DAY 90 tablet 1  . atorvastatin (LIPITOR) 80 MG tablet TAKE 1 TABLET(80 MG) BY MOUTH  DAILY 90 tablet 2  . bisoprolol (ZEBETA) 5 MG tablet TAKE 1 TABLET(5 MG) BY MOUTH DAILY 30 tablet 3  . budesonide-formoterol (SYMBICORT) 160-4.5 MCG/ACT inhaler Inhale 2 puffs into the lungs 2 (two) times daily. 1 Inhaler 0  . celecoxib (CELEBREX) 200 MG capsule TAKE ONE CAPSULE BY MOUTH EVERY 12 HOURS 40 capsule 0  . cetirizine (ZYRTEC) 10 MG tablet Take 10 mg by mouth at bedtime as needed (itching and sneezing).     . cholecalciferol 2000 units TABS Take 1 tablet (2,000 Units total) by mouth daily. 30 tablet 3  . clopidogrel (PLAVIX) 75 MG tablet TAKE 1 TABLET BY MOUTH EVERY DAY 90 tablet 1  .  clotrimazole-betamethasone (LOTRISONE) cream Apply 1 application topically as needed (itching). Use as directed for rash    . dextromethorphan-guaiFENesin (MUCINEX DM) 30-600 MG per 12 hr tablet Take 1-2 tablets by mouth every 12 (twelve) hours as needed for cough (with flutter).     . ezetimibe (ZETIA) 10 MG tablet TAKE 1 TABLET(10 MG) BY MOUTH DAILY 30 tablet 5  . famotidine (PEPCID) 20 MG tablet Take 20 mg by mouth at bedtime.     . fluticasone (FLONASE) 50 MCG/ACT nasal spray Place 2 sprays into both nostrils 2 (two) times daily as needed for allergies or rhinitis.     . furosemide (LASIX) 20 MG tablet TAKE 1 TABLET(20 MG) BY MOUTH DAILY 30 tablet 3  . isosorbide mononitrate (IMDUR) 60 MG 24 hr tablet TAKE 1 AND 1/2 TABLETS BY MOUTH EVERY MORNING AND 1/2 TABLET EVERY EVENING 180 tablet 1  . meclizine (ANTIVERT) 25 MG tablet TAKE 2 TABLETS BY MOUTH THREE TIMES DAILY AS NEEDED (Patient taking differently: TAKE 2 TABLETS BY MOUTH THREE TIMES DAILY AS NEEDED FOR DIZZNESS) 24 tablet 0  . montelukast (SINGULAIR) 10 MG tablet TAKE 1 TABLET(10 MG) BY MOUTH AT BEDTIME 30 tablet 5  . Multiple Vitamin (MULTIVITAMIN) capsule Take 1 capsule by mouth daily.     . nitroGLYCERIN (NITROSTAT) 0.4 MG SL tablet Place 0.4 mg under the tongue every 5 (five) minutes as needed for chest pain (may repeat x3).    Marland Kitchen omeprazole (PRILOSEC) 20 MG capsule Take 20 mg by mouth daily before breakfast.     . oxymetazoline (AFRIN) 0.05 % nasal spray Place 2 sprays into both nostrils 2 (two) times daily as needed for congestion. as needed for stuffy nose and sinus problems    . prednisoLONE acetate (PRED FORTE) 1 % ophthalmic suspension INT 1 GTT IN OPERATIVE EYE QID  0  . PROAIR HFA 108 (90 Base) MCG/ACT inhaler INHALE 1 TO 2 PUFFS BY MOUTH EVERY 4 HOURS AS NEEDED FOR WHEEZING 8.5 g 5  . RANEXA 500 MG 12 hr tablet TAKE 1 TABLET(500 MG) BY MOUTH TWICE DAILY 60 tablet 5  . sodium chloride (OCEAN) 0.65 % nasal spray Place 2 sprays  into the nose every 4 (four) hours as needed (nasal congestion).     . SYMBICORT 160-4.5 MCG/ACT inhaler INHALE 2 PUFFS BY MOUTH EVERY 12 HOURS 10.2 g 6  . traMADol (ULTRAM) 50 MG tablet Take 50 mg by mouth as needed.     No current facility-administered medications for this visit.     Family History  Problem Relation Age of Onset  . Diabetes Mother        AODM  . Stroke Mother   . Hypertension Mother   . Heart disease Father   . Diabetes Sister  AODM  . Breast cancer Sister   . Hypertension Sister   . Breast cancer Daughter 52       dec--mets to liver/spine    Review of Systems  Constitutional: Negative.   HENT: Negative.   Eyes: Negative.   Respiratory: Negative.   Cardiovascular: Negative.   Gastrointestinal: Negative.   Endocrine: Negative.   Genitourinary: Negative.   Musculoskeletal: Negative.   Skin:       New or changed mole   Allergic/Immunologic: Negative.   Neurological: Negative.   Hematological: Negative.   Psychiatric/Behavioral: Negative.     Exam:   BP (!) 142/78 (BP Location: Right Arm, Patient Position: Sitting, Cuff Size: Normal)   Pulse 80   Resp 14   Ht 4\' 11"  (1.499 m)   Wt 134 lb (60.8 kg)   LMP 12/21/1999   BMI 27.06 kg/m     General appearance: alert, cooperative and appears stated age Head: Normocephalic, without obvious abnormality, atraumatic Neck: no adenopathy, supple, symmetrical, trachea midline and thyroid normal to inspection and palpation Lungs: clear to auscultation bilaterally Breasts: normal appearance, no masses or tenderness, No nipple retraction or dimpling, No nipple discharge or bleeding, No axillary or supraclavicular adenopathy Heart: regular rate and rhythm Abdomen: soft, non-tender; no masses, no organomegaly Extremities: extremities normal, atraumatic, no cyanosis or edema Skin: Skin color, texture, turgor normal. No rashes or lesions Lymph nodes: Cervical, supraclavicular, and axillary nodes normal. No  abnormal inguinal nodes palpated Neurologic: Grossly normal  Pelvic: External genitalia:  no lesions              Urethra:  normal appearing urethra with no masses, tenderness or lesions              Bartholins and Skenes: normal                 Vagina: normal appearing vagina with normal color and discharge, no lesions              Cervix: no lesions              Pap taken: Yes.   Bimanual Exam:  Uterus:  normal size, contour, position, consistency, mobility, non-tender              Adnexa: no mass, fullness, tenderness              Rectal exam: Yes.  .  Confirms.              Anus:  normal sphincter tone, no lesions  Chaperone was present for exam.  Assessment:   Well woman visit with normal exam. FH breast cancer.  Osteopenia.    Hx CAD.  On Plavix.  Change in mole.   Plan: Mammogram screening yearly.  Recommended self breast awareness. Pap done.  Guidelines for Calcium, Vitamin D, regular exercise program including cardiovascular and weight bearing exercise. She will contact her dermatologist for a skin check.  Follow up annually and prn.   After visit summary provided.

## 2018-04-20 NOTE — Patient Instructions (Signed)

## 2018-04-21 ENCOUNTER — Other Ambulatory Visit: Payer: Self-pay | Admitting: Cardiovascular Disease

## 2018-04-21 LAB — CYTOLOGY - PAP: Diagnosis: NEGATIVE

## 2018-04-21 NOTE — Telephone Encounter (Signed)
Rx sent to pharmacy   

## 2018-06-08 ENCOUNTER — Ambulatory Visit: Payer: Medicare Other | Admitting: Internal Medicine

## 2018-06-08 ENCOUNTER — Encounter: Payer: Self-pay | Admitting: Internal Medicine

## 2018-06-08 VITALS — BP 112/60 | HR 77 | Ht 59.0 in | Wt 131.6 lb

## 2018-06-08 DIAGNOSIS — J4540 Moderate persistent asthma, uncomplicated: Secondary | ICD-10-CM | POA: Diagnosis not present

## 2018-06-08 DIAGNOSIS — I10 Essential (primary) hypertension: Secondary | ICD-10-CM | POA: Diagnosis not present

## 2018-06-08 NOTE — Patient Instructions (Addendum)
No change in medications - follow med calendar as written   Please schedule a follow up visit in 4  months but call sooner if needed to see Tammy NP

## 2018-06-08 NOTE — Progress Notes (Signed)
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 Follow in pulmonary clinic also for primary care with hbp/ hyperlipidemia complicated by MI 34/19/37 and persistent mucus plug in RML/ RLL s/p fob 02/05/15 with marked improvement p lavage and no endobrchial lesions, just diffuse airway swelling      History of Present Illness   10/14/13 Cassie Campbell > laser rx for back pain  rad legs > resolved    Admit Date: 10/15/2014 Discharge date: 10/19/2014    PRIMARY DISCHARGE DIAGNOSIS:  NSTEMI (non-ST elevated myocardial infarction)   Essential hypertension  GERD (gastroesophageal reflux disease)  Chest pain  Hyperlipidemia  Hypertension  CAD (coronary artery disease)       04/11/2017  f/u ov/Cassie Campbell re: asthma/ hbp/ leg swelling with pain R foot  Chief Complaint  Patient presents with  . Follow-up    Increased cough, wheezing anfd runny nose for the past month.    no change off arnuity/ worse though with nasal symptoms x one month  Wheezing better when using the symb 160 2bid  rec Stop bisoprolol-hct and instead take bisoprolol 5 mg daily  Start lasix (furosemide) 20 mg daily Prednisone 10 mg take  4 each am x 2 days,   2 each am x 2 days,  1 each am x 2 days and stop       08/15/17 Cassie Campbell back surgery     11/24/2017  f/u ov/Cassie Campbell re:  Asthma/ hbp  Chief Complaint  Patient presents with  . Follow-up    Pt states she is getting over a cold. She is coughing up some clear sputum.  She states when she had the cold she used proair 2 x daily for 5 days.   last week in Nov 2018  bad head/ chest cold s need for pred/abx > back to basline No ex due to back but Not limited by breathing from desired activities  Sleeping fine rec No change rx     06/08/2018   f/u ov/Cassie Campbell re: asthma /hbp/chronic rhinitis  Chief Complaint  Patient presents with  . Follow-up    Breathing is unchanged.  She has not had to use her proair.  She has occ cough with clear sputum.     Dyspnea:  At nl pace can do 20 min  Cough: minimal  Sleep: fine flat SABA use:  None on symb 160 2bid    No obvious day to day or daytime variability or assoc excess/ purulent sputum or mucus plugs or hemoptysis or cp or chest tightness, subjective wheeze or overt sinus or hb symptoms. No unusual exposure hx or h/o childhood pna/ asthma or knowledge of premature birth.  Sleeping  Fine flat  without nocturnal  or early am exacerbation  of respiratory  c/o's or need for noct saba. Also denies any obvious fluctuation of symptoms with weather or environmental changes or other aggravating or alleviating factors except as outlined above   Current Allergies, Complete Past Medical History, Past Surgical History, Family History, and Social History were reviewed in [employer] record.  ROS  The following are not active complaints unless bolded Hoarseness, sore throat, dysphagia, dental problems, itching, sneezing,  nasal congestion or discharge of excess mucus or purulent secretions, ear ache,  fever, chills, sweats, unintended wt loss or wt gain, classically pleuritic or exertional cp,  orthopnea pnd or arm/hand swelling  or leg swelling, presyncope, palpitations, abdominal pain, anorexia, nausea, vomiting, diarrhea  or change in bowel habits or change in bladder habits, change in stools or change in urine, dysuria, hematuria,  rash, arthralgias, visual complaints, headache, numbness, weakness or ataxia or problems with walking or coordination,  change in mood or  memory.        Current Meds  Medication Sig  . amLODipine (NORVASC) 10 MG tablet TAKE 1 TABLET BY MOUTH EVERY DAY  . atorvastatin (LIPITOR) 80 MG tablet TAKE 1 TABLET(80 MG) BY MOUTH DAILY  . bisoprolol (ZEBETA) 5 MG  tablet TAKE 1 TABLET(5 MG) BY MOUTH DAILY  . budesonide-formoterol (SYMBICORT) 160-4.5 MCG/ACT inhaler Inhale 2 puffs into the lungs 2 (two) times daily.  . celecoxib (CELEBREX) 200 MG capsule TAKE ONE CAPSULE BY MOUTH EVERY 12 HOURS  . cetirizine (ZYRTEC) 10 MG tablet Take 10 mg by mouth at bedtime as needed (itching and sneezing).   . cholecalciferol 2000 units TABS Take 1 tablet (2,000 Units total) by mouth daily.  . clopidogrel (PLAVIX) 75 MG tablet TAKE 1 TABLET BY MOUTH EVERY DAY  . clotrimazole-betamethasone (LOTRISONE) cream Apply 1 application topically as needed (itching). Use as directed for rash  . dextromethorphan-guaiFENesin (MUCINEX DM) 30-600 MG per 12 hr tablet Take 1-2 tablets by mouth every 12 (twelve) hours as needed for cough (with flutter).   . ezetimibe (ZETIA) 10 MG tablet TAKE 1 TABLET BY MOUTH EVERY MORNING NEED APPOINTMENT FOR REFILLS  . famotidine (PEPCID) 20 MG tablet Take 20 mg by mouth at bedtime.   . fluticasone (FLONASE) 50 MCG/ACT nasal spray Place 2 sprays into both nostrils 2 (two) times daily as needed for allergies or rhinitis.   . furosemide (LASIX) 20 MG tablet TAKE 1 TABLET(20 MG) BY MOUTH DAILY  . isosorbide mononitrate (IMDUR) 60 MG 24 hr tablet TAKE 1 AND 1/2 TABLETS BY MOUTH EVERY MORNING AND 1/2 TABLET EVERY EVENING  . meclizine (ANTIVERT) 25 MG tablet TAKE 2 TABLETS BY MOUTH THREE TIMES DAILY AS NEEDED (Patient taking differently: TAKE 2 TABLETS BY MOUTH THREE TIMES DAILY AS NEEDED FOR DIZZNESS)  . montelukast (SINGULAIR) 10 MG tablet TAKE 1 TABLET(10 MG) BY MOUTH AT BEDTIME  . Multiple Vitamin (MULTIVITAMIN) capsule Take 1 capsule by mouth daily.   . nitroGLYCERIN (NITROSTAT) 0.4 MG SL tablet Place 0.4 mg under the tongue every 5 (five) minutes as needed for chest pain (may repeat x3).  Marland Kitchen omeprazole (PRILOSEC) 20 MG capsule Take 20 mg by mouth daily before breakfast.   . oxymetazoline (AFRIN) 0.05 % nasal spray Place 2 sprays into both nostrils 2  (two) times daily as needed for congestion. as needed for stuffy nose and sinus problems  . PROAIR HFA 108 (90 Base) MCG/ACT inhaler INHALE 1 TO 2 PUFFS BY MOUTH EVERY 4 HOURS AS NEEDED FOR WHEEZING  . RANEXA 500 MG 12 hr tablet TAKE 1 TABLET(500 MG) BY MOUTH TWICE DAILY  . sodium chloride (OCEAN) 0.65 % nasal spray Place 2 sprays into the nose every 4 (four) hours as needed (nasal congestion).   . SYMBICORT 160-4.5 MCG/ACT inhaler INHALE 2 PUFFS BY MOUTH EVERY 12 HOURS  . traMADol (ULTRAM) 50 MG tablet Take 50 mg by mouth as needed.                         Past  Medical History:  BENIGN POSITIONAL VERTIGO (ICD-386.11)  OSTEOPENIA (ICD-733.90)  - DEXA 08/22/07 AP spine + 1.1, left femur -1.3, right femur -.8  - DEXA 10/06/09 + 1.6 left femur -1.6, right femur -.5   -DEXA 2014 no change  HYPERTENSION (ICD-401.9)  HYPERLIPIDEMIA (ICD-272.4)  - Target < 130 ldl pos fm hx, hbp  OBESITY  - Target wt = 178 for BMI < 30  ASTHMA (ICD-493.90) ([name] not active)  -Xolair s 8/05 > 10/2006  -Mastered HFA December 30, 2008  ALLERGIC RHINITIS (ICD-477.9)  S/p cervical fusion 2000 ............................................................................ Andrews...........................................................................Marland KitchenWert  - Td 05/20/2015  - Pneumovax 09/2003 and October 27, 2009  Prevnar 05/07/2014  - Colonscopy 10/28/2004  - CPX 11/19/13     - GYN per [name]'s office -Mammogram 2013 , 2014         Objective:   Physical Exam  Wt 143 11/09/2011 >  04/12/2012  140 > 06/26/2012  139 >  11/13/2012 135 >  137 02/12/2013 >141 05/16/2013 > 07/31/2013  139 >  11/19/2013  134 >135 02/18/2014 > 04/16/2014 134 > 05/07/2014  134 >131 08/06/2014 > 10/30/2014 130 > 11/28/2014 128 > 02/18/2015   133 > 05/20/2015 133 > 07/14/2015   135 >137 08/26/2015 > 11/25/2015 143 > 12/29/2015  137 > 02/23/2016   131 > 06/10/2016   148 > 07/08/2016   148 > 10/06/2016 146 > 04/11/2017  140 >  11/24/2017   136 > 06/08/2018  96%    amb pleasant bf nad / all smiles  Vital signs reviewed - Note on arrival 02 sats  96% on RA  And bp 112/60     HEENT: nl   turbinates bilaterally, and oropharynx. Nl external ear canals without cough reflex- top dentures    NECK :  without JVD/Nodes/TM/ nl carotid upstrokes bilaterally   LUNGS: no acc muscle use,  Nl contour chest which is clear to A and P bilaterally without cough on insp or exp maneuvers   CV:  RRR  no s3 or murmur or increase in P2, and no edema   ABD:  soft and nontender with nl inspiratory excursion in the supine position. No bruits or organomegaly appreciated, bowel sounds nl  MS:  Nl gait/ ext warm without deformities, calf tenderness, cyanosis or clubbing No obvious joint restrictions   SKIN: warm and dry without lesions    NEURO:  alert, approp, nl sensorium with  no motor or cerebellar deficits apparent.               Assessment & Plan:

## 2018-06-11 ENCOUNTER — Encounter: Payer: Self-pay | Admitting: Internal Medicine

## 2018-06-11 NOTE — Assessment & Plan Note (Signed)
(  [name] not active)  -Xolair rx  07/2004 > 10/2006  - IgE  296 11/20/2013  - Prevnar rx 05/07/14  - Spirometry 06/10/2016  FEV1 1.06 (80%)  Ratio 57 - NO   06/10/2016  = 15   - 06/10/2016  After extensive coaching HFA effectiveness =    75%  - IgE  06/10/16  293  - FENO 07/08/2016  =   9   - 07/08/2016 instructed on dpi > changed to arnuity 100 qam due to insurance > try off 10/06/2016   -  11/24/2017  After extensive coaching HFA effectiveness =    90% from a baseline of 75%   All goals of chronic asthma control met including optimal function and elimination of symptoms with minimal need for rescue therapy.  Contingencies discussed in full including contacting this office immediately if not controlling the symptoms using the rule of two's.

## 2018-06-11 NOTE — Assessment & Plan Note (Signed)
-  Changed lopressor to bisoprolol 11/29/14 due to wheezing  - changed to bisoprolol /hct due to tendency to leg swelling > d/c 04/11/2017 with ?  gout L foot   Adequate control on present rx, reviewed in detail with pt > no change in rx needed  / no gout recurrences off hcts

## 2018-06-15 ENCOUNTER — Encounter: Payer: Self-pay | Admitting: Cardiovascular Disease

## 2018-06-15 ENCOUNTER — Ambulatory Visit: Payer: Medicare Other | Admitting: Cardiovascular Disease

## 2018-06-15 VITALS — BP 128/70 | HR 81 | Ht 59.0 in | Wt 132.0 lb

## 2018-06-15 DIAGNOSIS — I2510 Atherosclerotic heart disease of native coronary artery without angina pectoris: Secondary | ICD-10-CM

## 2018-06-15 DIAGNOSIS — I10 Essential (primary) hypertension: Secondary | ICD-10-CM | POA: Diagnosis not present

## 2018-06-15 DIAGNOSIS — I4510 Unspecified right bundle-branch block: Secondary | ICD-10-CM

## 2018-06-15 DIAGNOSIS — E785 Hyperlipidemia, unspecified: Secondary | ICD-10-CM | POA: Diagnosis not present

## 2018-06-15 DIAGNOSIS — J4520 Mild intermittent asthma, uncomplicated: Secondary | ICD-10-CM

## 2018-06-15 MED ORDER — EZETIMIBE 10 MG PO TABS: 10.0000 mg | ORAL_TABLET | Freq: Every day | ORAL | 3 refills | Status: DC

## 2018-06-15 NOTE — Patient Instructions (Signed)
Medication Instructions:  Your physician recommends that you continue on your current medications as directed. Please refer to the Current Medication list given to you today.  Follow-Up: 6 months with Dr. Claiborne Billings  Any Other Special Instructions Will Be Listed Below (If Applicable).     If you need a refill on your cardiac medications before your next appointment, please call your pharmacy.

## 2018-06-15 NOTE — Progress Notes (Signed)
Patient ID: Cassie Campbell, female   DOB: 07/04/1941, 77 y.o.   MRN: [id number]     HPI: Cassie Campbell is a 77 y.o. female who presents for 7 month follow-up cardiology evaluation.    [name] has a history of complex CAD and underwent catheterization by Dr. Fletcher Anon with class IV symptoms on 10/16/2014.  She was found to have diffusely diseased LAD, which was not suitable for revascularization.  She had ruled in for non-ST segment elevation myocardial infarction, felt to be due to a severely calcified almost subtotal RCA with severe diffuse calcified disease.  Initial attempt at PCI by Dr. Fletcher Anon was unsuccessful due to the severe calcification.  She was brought back to the laboratory the following day, and I performed a very long attempt at high-speed rotational atherectomy.  Unfortunately, due to the severely stenosed calcified lesion above the acute margin the Roto floppy wire was never be advanced distal enough due to severe disease beyond this site to allow the burr to be inserted to reach the stenosis.  Multiple attempts were made to utilize wire transfer, but even the smallest catheter was never able to pass the stenosis to allow for the Roto floppy wire to be reinserted in exchange for a Fielder XT wire, which was able to cross the stenosis and advanced distally.  Consequently, the procedure was aborted.  She was sent home on increased medication regimen with potential plans for possible follow-up evaluation depending upon symptom status.  Since hospital discharge, she has not experienced any significant recurrent anginal chest pain.  During the hospitalization ranolazine was added initially at 500 mg twice a day to isosorbide mononitrate, amlodipine, and beta blocker therapy.  She has been treated with aspirin and Brilinta for dual antiplatelet therapy. When I saw her for subsequent evaluation I further titrated her ranolazine to 1000 mg twice a day and further titrated her Lopressor to 37.5 mg  twice a day.  Her creatinine remained normal following her contrast load and she was mildly anemic.    She has a history of asthma and is on Symbicort 160-4.5 and Singulair.  She  has a history of hyperlipidemia, currently on Lipitor 80 mg.  She does have GERD for which he takes Pepcid as well as omeprazole.  She developed some increased wheezing on Lopressor and was changed to bisoprolol 5 mg in place of metoprolol.  She has seen Dr. Melvyn Novas for her severe chronic asthma and in the past.  She also was found to have marked atopy with significant elevation of IgG E levels.  She feels that her breathing has improved with the changed to bisoprolol from Lopressor.   In March 2016 she had been remaining stable but  had experienced a 10-15 minute episode of chest pain during the evening which responded to nitroglycerin. When I saw her the following day she had been taking isosorbide 90 mg in the morning and I added isosorbide 30 mg at bedtime to her medical regimen of Ranexa 1000 mg twice a day , amlodipine 10 mg daily, and bisoprolol 5 mg. She denies recurrent chest pain symptomatology since the nocturnal dose of Imdur was added.  She developed a rash and  was advised to stop both Brilinta and Ranexa.  She was started on Plavix. Her rash ultimately resolved.    Her husband passed away in on 2016/01/09.  She has been more active.  She denies nocturnal symptoms.  She was evaluated in the emergency room in January 2017 with  right shoulder discomfort.    She underwent low back surgery in August 2018 by Dr. Louanne Skye. She tolerated surgery well.  A preoperative nuclear study showed an EF of 70% with probable apical soft tissue attenuation.  There was no ischemia.  Last saw her in November 2018 and she was doing well without chest pain or shortness of breath.  Although she has a history of mild asthma.   Since I last saw her, she was diagnosed with GERD.  She underwent successful cataract surgery right eye in February and  left eye in April 2019 by Dr. Satira Sark.  He denies any chest pain or palpitations.  She continues to be on isosorbide 90 mg in the morning and 30 mg in the evening, amlodipine 10 mg daily, bisoprolol 5 mg in addition to Ranexa 500 mg twice a day.  She is on high potency statin therapy with atorvastatin 80 mg in addition to Zetia 10 mg for her CAD with target LDL less than 70.  Most recent laboratory on March 09, 2018 showed an LDL of 63.  She is on Symbicort and Singulair for her asthma.  She had recently seen Dr. Melvyn Novas of pulmonary.  She presents for cardiology reevaluation.   Past Medical History:  Diagnosis Date  . Allergic rhinitis   . Arthritis   . Asthma    xolair s 8/05 ?11/07; mastered hfa 12/20/08  . Benign positional vertigo   . CAD (coronary artery disease)    a. 09/2014 NSTEMI s/p LHC with sig 2V dz. dLAD diffusely diseased and not suitable for PCI. unsuccessful RCA PCI d/t heavy calcifications  . Depression   . Dyspnea   . GERD (gastroesophageal reflux disease)   . Heart attack ([place])    09/2014  . Hyperlipidemia    <130 ldl pos fm hx, bp  . Hypertension   . Obesity   . Osteopenia    dexa 08/22/07 AP spine + 1.1, left femur -1.3, right femur -.8; dexa 10/06/09 +1.6, left femur =1.6, right femur -.  . PONV (postoperative nausea and vomiting)   . Ruptured disc, cervical   . 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 BREAST SURGERY  10-26-10   Rt. breast bx--for microcalcifications in rt. retroareolar region--dx was hyalinized fibroadenoma  . BUNIONECTOMY Bilateral   . CARDIAC CATHETERIZATION     2015  . CATARACT EXTRACTION Right 02/02/2018   Dr Satira Sark  . CATARACT EXTRACTION Left 03/30/2018  . [place] SURGERY  12/01  . LEFT HEART CATHETERIZATION WITH CORONARY ANGIOGRAM N/A 10/16/2014   Procedure: LEFT HEART CATHETERIZATION WITH CORONARY ANGIOGRAM;  Surgeon: Blane Ohara, MD;  Location: [facility]  CATH LAB;  Service: Cardiovascular;  Laterality: N/A;  . PERCUTANEOUS CORONARY ROTOBLATOR INTERVENTION (PCI-R) N/A 10/17/2014   Procedure: PERCUTANEOUS CORONARY ROTOBLATOR INTERVENTION (PCI-R);  Surgeon: Troy Sine, MD;  Location: [facility] CATH LAB;  Service: Cardiovascular;  Laterality: N/A;  . VEIN LIGATION AND STRIPPING    . VIDEO BRONCHOSCOPY Bilateral 02/05/2015   Procedure: VIDEO BRONCHOSCOPY WITHOUT FLUORO;  Surgeon: Tanda Rockers, MD;  Location: [facility] ENDOSCOPY;  Service: Endoscopy;  Laterality: Bilateral;  . VULVA /PERINEUM BIOPSY  12-30-10   --epidermoid cyst    Allergies  Allergen Reactions  . Fish-Derived Products Shortness Of Breath, Swelling and Rash  . Penicillins Shortness Of Breath, Swelling and Rash    PATIENT HAS HAD A PCN REACTION WITH IMMEDIATE RASH, FACIAL/TONGUE/THROAT SWELLING, SOB,  OR LIGHTHEADEDNESS WITH HYPOTENSION:  #  #  #  YES  #  #  #   Has patient had a PCN reaction causing severe rash involving mucus membranes or skin necrosis: No PATIENT HAS HAD A PCN REACTION THAT REQUIRED HOSPITALIZATION:  #  #  #  YES  #  #  #   Has patient had a PCN reaction occurring within the last 10 years: No    . Aspirin Swelling and Rash    SWELLING REACTION UNSPECIFIED   . Brilinta [Ticagrelor] Rash    Started Brilinta 09/2014 - rash - unsure which it is directly related to  . Codeine Phosphate Nausea Only  . Diphenhydramine Hcl Rash    Current Outpatient Medications  Medication Sig Dispense Refill  . amLODipine (NORVASC) 10 MG tablet TAKE 1 TABLET BY MOUTH EVERY DAY 90 tablet 1  . atorvastatin (LIPITOR) 80 MG tablet TAKE 1 TABLET(80 MG) BY MOUTH DAILY 90 tablet 2  . bisoprolol (ZEBETA) 5 MG tablet TAKE 1 TABLET(5 MG) BY MOUTH DAILY 30 tablet 3  . budesonide-formoterol (SYMBICORT) 160-4.5 MCG/ACT inhaler Inhale 2 puffs into the lungs 2 (two) times daily. 1 Inhaler 0  . celecoxib (CELEBREX) 200 MG capsule TAKE ONE CAPSULE BY MOUTH EVERY 12 HOURS 40 capsule 0  . cetirizine  (ZYRTEC) 10 MG tablet Take 10 mg by mouth at bedtime as needed (itching and sneezing).     . cholecalciferol 2000 units TABS Take 1 tablet (2,000 Units total) by mouth daily. 30 tablet 3  . clopidogrel (PLAVIX) 75 MG tablet TAKE 1 TABLET BY MOUTH EVERY DAY 90 tablet 1  . clotrimazole-betamethasone (LOTRISONE) cream Apply 1 application topically as needed (itching). Use as directed for rash    . dextromethorphan-guaiFENesin (MUCINEX DM) 30-600 MG per 12 hr tablet Take 1-2 tablets by mouth every 12 (twelve) hours as needed for cough (with flutter).     . famotidine (PEPCID) 20 MG tablet Take 20 mg by mouth at bedtime.     . fluticasone (FLONASE) 50 MCG/ACT nasal spray Place 2 sprays into both nostrils 2 (two) times daily as needed for allergies or rhinitis.     . furosemide (LASIX) 20 MG tablet TAKE 1 TABLET(20 MG) BY MOUTH DAILY 30 tablet 3  . isosorbide mononitrate (IMDUR) 60 MG 24 hr tablet TAKE 1 AND 1/2 TABLETS BY MOUTH EVERY MORNING AND 1/2 TABLET EVERY EVENING 180 tablet 1  . meclizine (ANTIVERT) 25 MG tablet TAKE 2 TABLETS BY MOUTH THREE TIMES DAILY AS NEEDED (Patient taking differently: TAKE 2 TABLETS BY MOUTH THREE TIMES DAILY AS NEEDED FOR DIZZNESS) 24 tablet 0  . montelukast (SINGULAIR) 10 MG tablet TAKE 1 TABLET(10 MG) BY MOUTH AT BEDTIME 30 tablet 5  . Multiple Vitamin (MULTIVITAMIN) capsule Take 1 capsule by mouth daily.     . nitroGLYCERIN (NITROSTAT) 0.4 MG SL tablet Place 0.4 mg under the tongue every 5 (five) minutes as needed for chest pain (may repeat x3).    Marland Kitchen omeprazole (PRILOSEC) 20 MG capsule Take 20 mg by mouth daily before breakfast.     . oxymetazoline (AFRIN) 0.05 % nasal spray Place 2 sprays into both nostrils 2 (two) times daily as needed for congestion. as needed for stuffy nose and sinus problems    . PROAIR HFA 108 (90 Base) MCG/ACT inhaler INHALE 1 TO 2 PUFFS BY MOUTH EVERY 4 HOURS AS NEEDED FOR WHEEZING 8.5 g 5  . RANEXA 500 MG 12 hr tablet TAKE 1 TABLET(500 MG) BY  MOUTH TWICE DAILY 60 tablet 5  . sodium chloride (OCEAN) 0.65 % nasal spray Place 2 sprays into the nose every 4 (four) hours as needed (nasal congestion).     . SYMBICORT 160-4.5 MCG/ACT inhaler INHALE 2 PUFFS BY MOUTH EVERY 12 HOURS 10.2 g 6  . traMADol (ULTRAM) 50 MG tablet Take 50 mg by mouth as needed.    . ezetimibe (ZETIA) 10 MG tablet Take 1 tablet (10 mg total) by mouth daily. 90 tablet 3   No current facility-administered medications for this visit.     Social History   Socioeconomic History  . Marital status: Married    Spouse name: Not on file  . Number of children: Not on file  . Years of education: Not on file  . Highest education level: Not on file  Occupational History  . Occupation: retired Tour manager  Social Needs  . Financial resource strain: Not on file  . Food insecurity:    Worry: Not on file    Inability: Not on file  . Transportation needs:    Medical: Not on file    Non-medical: Not on file  Tobacco Use  . Smoking status: Never Smoker  . Smokeless tobacco: Never Used  Substance and Sexual Activity  . Alcohol use: No    Alcohol/week: 0.0 oz  . Drug use: No  . Sexual activity: Not Currently    Partners: Male    Birth control/protection: Post-menopausal  Lifestyle  . Physical activity:    Days per week: Not on file    Minutes per session: Not on file  . Stress: Not on file  Relationships  . Social connections:    Talks on phone: Not on file    Gets together: Not on file    Attends religious service: Not on file    Active member of club or organization: Not on file    Attends meetings of clubs or organizations: Not on file    Relationship status: Not on file  . Intimate partner violence:    Fear of current or ex partner: Not on file    Emotionally abused: Not on file    Physically abused: Not on file    Forced sexual activity: Not on file  Other Topics Concern  . Not on file  Social History Narrative  . Not on file     Family History  Problem Relation Age of Onset  . Diabetes Mother        AODM  . Stroke Mother   . Hypertension Mother   . Heart disease Father   . Diabetes Sister        AODM  . Breast cancer Sister   . Hypertension Sister   . Breast cancer Daughter 33       dec--mets to liver/spine    ROS General: Negative; No fevers, chills, or night sweats HEENT: This post recent cataract surgery in both eyes in February and April 2019; no sinus congestion, difficulty swallowing Pulmonary: positive for asthma; No cough, wheezing, shortness of breath, hemoptysis Cardiovascular: See HPI:  GI: Negative; No nausea, vomiting, diarrhea, or abdominal pain GU: Negative; No dysuria, hematuria, or difficulty voiding Musculoskeletal: Occasional right shoulder discomfort, and low back discomfort Hematologic: Negative; no easy bruising, bleeding Endocrine: Negative; no heat/cold intolerance; no diabetes, Neuro: Negative; no changes in balance, headaches Skin: Negative; No rashes or skin lesions Psychiatric: Negative; No behavioral problems, depression Sleep: Negative; No snoring,  daytime sleepiness, hypersomnolence, bruxism, restless legs, hypnogognic hallucinations. Other  comprehensive 14 point system review is negative   Physical Exam BP 128/70   Pulse 81   Ht '4\' 11"'$  (1.499 m)   Wt 132 lb (59.9 kg)   LMP 12/21/1999   BMI 26.66 kg/m    Repeat blood pressure by me was 120/70 supine, 118/72 standing  Wt Readings from Last 3 Encounters:  06/15/18 132 lb (59.9 kg)  06/08/18 131 lb 9.6 oz (59.7 kg)  04/20/18 134 lb (60.8 kg)   General: Alert, oriented, no distress.  Skin: normal turgor, no rashes, warm and dry HEENT: Normocephalic, atraumatic. Pupils equal round and reactive to light; sclera anicteric; extraocular muscles intact;  Nose without nasal septal hypertrophy Mouth/Parynx benign; Mallinpatti scale 3 Neck: No JVD, no carotid bruits; normal carotid upstroke Lungs: clear to  ausculatation and percussion; no wheezing or rales Chest wall: without tenderness to palpitation Heart: PMI not displaced, RRR, s1 s2 normal, 1/6 systolic murmur, no diastolic murmur, no rubs, gallops, thrills, or heaves Abdomen: soft, nontender; no hepatosplenomehaly, BS+; abdominal aorta nontender and not dilated by palpation. Back: no CVA tenderness Pulses 2+ Musculoskeletal: full range of motion, normal strength, no joint deformities Extremities: no clubbing cyanosis or edema, Homan's sign negative  Neurologic: grossly nonfocal; Cranial nerves grossly wnl Psychologic: Normal mood and affect  ECG (independently read by me): Normal sinus rhythm 81 bpm.  Right bundle branch block with repolarization changes.  November 16, 2017 ECG (independently read by me): Normal sinus rhythm at 75 bpm with right bundle branch block.  Left axis deviation.  Q waves in 3 and aVF.  April 2018 ECG (independently read by me): Normal sinus rhythm at 74 bpm.  Right bundle-branch block with repolarization changes.  October 2017 ECG (independently read by me): Normal sinus rhythm at 68 bpm.  The right bundle branch block with repolarization changes.  April 2017 ECG (independently read by me): Normal sinus rhythm at 70 bpm.  Right bundle-branch block with repolarization changes.  November 2016 ECG (independently read by me): Normal sinus rhythm at 70 bpm.  Right bundle-branch block with repolarization changes.  May 2016 ECG (independently read by me):  Normal sinus rhythm at 65 bpm. Right bundle branch block with repolarization changes.  March 2016 ECG (independently read by me): Normal sinus rhythm at 68 bpm.  No ectopy.  No signal been ST segment changes.  December 2015 ECG (independently read by me): Normal sinus rhythm at 77 bpm.  Normal intervals.  No significant ST changes.  November 2015 ECG (independently read by me):sinus rhythm at 88 bpm.  QTc interval 454 ms.  No significant ST-T  changes.  LABS:  BMP Latest Ref Rng & Units 03/09/2018 11/29/2017 11/24/2017  Glucose 70 - 99 mg/dL 113(H) 124(H) 101(H)  BUN 6 - 23 mg/dL '11 11 11  '$ Creatinine 0.40 - 1.20 mg/dL 0.79 0.71 0.71  Sodium 135 - 145 mEq/L 138 136 140  Potassium 3.5 - 5.1 mEq/L 3.7 3.1(L) 3.6  Chloride 96 - 112 mEq/L 100 102 103  CO2 19 - 32 mEq/L '27 26 28  '$ Calcium 8.4 - 10.5 mg/dL 10.1 9.2 9.3    Hepatic Function Latest Ref Rng & Units 03/09/2018 08/10/2017 10/04/2016  Total Protein 6.0 - 8.3 g/dL 8.8(H) 8.0 7.0  Albumin 3.5 - 5.2 g/dL 4.4 3.8 3.9  AST 0 - 37 U/L '20 31 19  '$ ALT 0 - 35 U/L '15 24 20  '$ Alk Phosphatase 39 - 117 U/L 94 80 69  Total Bilirubin 0.2 - 1.2 mg/dL 0.5 0.9  0.8  Bilirubin, Direct 0.0 - 0.3 mg/dL 0.2 0.1 -    CBC Latest Ref Rng & Units 03/09/2018 11/29/2017 11/24/2017  WBC 4.0 - 10.5 K/uL 8.1 9.2 8.3  Hemoglobin 12.0 - 15.0 g/dL 11.6(L) 11.5(L) 12.0  Hematocrit 36.0 - 46.0 % 33.6(L) 34.9(L) 35.7(L)  Platelets 150.0 - 400.0 K/uL 569.0(H) 447(H) 506.0(H)   Lab Results  Component Value Date   MCV 80.9 03/09/2018   MCV 87.0 11/29/2017   MCV 87.0 11/24/2017   Lab Results  Component Value Date   TSH 2.14 03/09/2018  No results found for: HGBA1C  Lipid Panel     Component Value Date/Time   CHOL 131 03/09/2018 0909   TRIG 109.0 03/09/2018 0909   TRIG 85 10/05/2006 1022   HDL 46.50 03/09/2018 0909   CHOLHDL 3 03/09/2018 0909   VLDL 21.8 03/09/2018 0909   LDLCALC 63 03/09/2018 0909   RADIOLOGY: No results found.  IMPRESSION:  1. Coronary artery disease involving native coronary artery of native heart without angina pectoris   2. Essential hypertension   3. Hyperlipidemia with target LDL less than 70   4. Right bundle branch block   5. Mild intermittent asthma without complication     ASSESSMENT AND PLAN: Mrs. January Bergthold is a 77 year old African-American female who suffered a NSTEMI in October 2015 which was felt to be due to subtotal high-grade calcified RCA with  severe diffuse distal disease beyond her subtotal stenosis.  She also has a severely diffusely diseased mid distal LAD which is not amenable for revascularization.  At the time of her intervention, the smallest balloon catheter that was available in the catheterization laboratory was a 1.20 mm and this was unable to cross the stenosis.  Since that time, we have been titrating her medical regimen.  Fortunately, she has responded well to aggressive medical regimen consisting of amlodipine 10 mg, bisoprolol 5 mg, isosorbide mononitrate 90 mg in the morning and 30 mg at night in addition to ranexa 500 mg twice a day. She  has chronic right bundle branch block which is stable.  She continues to be asymptomatic with reference to recurrent anginal symptomatology almost 4 years later.  She sees Dr. Melvyn Novas for her asthma.  This has been fairly stable on a regimen of Symbicort, Singulair, at times she has had to take transient prednisone.  Her blood pressure today is stable on her regimen consisting of amlodipine 10 mg, bisoprolol 5 mg, furosemide 20 mg, isosorbide.  She continues to be on antiplatelet therapy with Plavix and denies any bleeding.  Her lipid studies are excellent on her atorvastatin 80 mg and Zetia 10 mg combination with the most recent LDL at 63.  She will continue her current regimen.  I will see her in 6 months for reevaluation.   Time Spent: 25 minutes Troy Sine, MD, [facility]  06/17/2018 1:12 PM

## 2018-06-17 ENCOUNTER — Encounter: Payer: Self-pay | Admitting: Cardiovascular Disease

## 2018-06-19 NOTE — Addendum Note (Signed)
Addended by: Ulice Brilliant T on: 06/19/2018 01:33 PM   Modules accepted: Orders

## 2018-06-20 ENCOUNTER — Other Ambulatory Visit: Payer: Self-pay | Admitting: Cardiovascular Disease

## 2018-06-20 ENCOUNTER — Other Ambulatory Visit (INDEPENDENT_AMBULATORY_CARE_PROVIDER_SITE_OTHER): Payer: Self-pay | Admitting: Specialist

## 2018-06-20 ENCOUNTER — Other Ambulatory Visit: Payer: Self-pay | Admitting: Internal Medicine

## 2018-06-20 NOTE — Telephone Encounter (Signed)
Celecoxib Refill request

## 2018-07-17 ENCOUNTER — Other Ambulatory Visit: Payer: Self-pay | Admitting: Internal Medicine

## 2018-08-08 ENCOUNTER — Other Ambulatory Visit: Payer: Self-pay | Admitting: Internal Medicine

## 2018-08-09 ENCOUNTER — Ambulatory Visit (INDEPENDENT_AMBULATORY_CARE_PROVIDER_SITE_OTHER): Payer: Medicare Other | Admitting: Specialist

## 2018-08-14 ENCOUNTER — Telehealth: Payer: Self-pay

## 2018-08-14 NOTE — Telephone Encounter (Addendum)
Stated PA for Symbicort on covermymeds. Will await response. Will route to [place].  Cheryln Manly (Key: [id number])    [facility] is reviewing your PA request. Typically an electronic response will be received within 72 hours. To check for an update later, open this request from your dashboard. You may close this dialog and return to your dashboard to perform other tasks.

## 2018-08-15 NOTE — Telephone Encounter (Signed)
Per CMM- PA is not needed for symbicort, as this medication is preferred.  [facility] has been made aware of this information. Nothing further is needed.

## 2018-09-14 ENCOUNTER — Other Ambulatory Visit (INDEPENDENT_AMBULATORY_CARE_PROVIDER_SITE_OTHER): Payer: Self-pay | Admitting: Specialist

## 2018-09-14 NOTE — Telephone Encounter (Signed)
Celecoxib refill request  

## 2018-09-16 ENCOUNTER — Other Ambulatory Visit: Payer: Self-pay | Admitting: Internal Medicine

## 2018-09-19 ENCOUNTER — Other Ambulatory Visit: Payer: Self-pay | Admitting: Cardiovascular Disease

## 2018-09-26 ENCOUNTER — Encounter (HOSPITAL_COMMUNITY): Payer: Self-pay | Admitting: *Deleted

## 2018-09-26 ENCOUNTER — Encounter: Payer: Self-pay | Admitting: Cardiology

## 2018-09-26 ENCOUNTER — Telehealth: Payer: Self-pay | Admitting: Cardiovascular Disease

## 2018-09-26 ENCOUNTER — Other Ambulatory Visit: Payer: Self-pay

## 2018-09-26 ENCOUNTER — Observation Stay (HOSPITAL_COMMUNITY): Payer: Medicare Other

## 2018-09-26 ENCOUNTER — Ambulatory Visit (INDEPENDENT_AMBULATORY_CARE_PROVIDER_SITE_OTHER): Payer: Medicare Other | Admitting: Cardiology

## 2018-09-26 ENCOUNTER — Inpatient Hospital Stay (HOSPITAL_COMMUNITY)
Admission: EM | Admit: 2018-09-26 | Discharge: 2018-10-09 | DRG: 229 | Disposition: A | Discharge status: Home Health | Payer: Medicare Other | Attending: Thoracic Surgery (Cardiothoracic Vascular Surgery) | Admitting: Thoracic Surgery (Cardiothoracic Vascular Surgery)

## 2018-09-26 DIAGNOSIS — J4540 Moderate persistent asthma, uncomplicated: Secondary | ICD-10-CM | POA: Diagnosis not present

## 2018-09-26 DIAGNOSIS — I4510 Unspecified right bundle-branch block: Secondary | ICD-10-CM | POA: Diagnosis present

## 2018-09-26 DIAGNOSIS — I4519 Other right bundle-branch block: Secondary | ICD-10-CM | POA: Diagnosis present

## 2018-09-26 DIAGNOSIS — R072 Precordial pain: Secondary | ICD-10-CM | POA: Diagnosis not present

## 2018-09-26 DIAGNOSIS — E669 Obesity, unspecified: Secondary | ICD-10-CM | POA: Diagnosis present

## 2018-09-26 DIAGNOSIS — I10 Essential (primary) hypertension: Secondary | ICD-10-CM | POA: Diagnosis not present

## 2018-09-26 DIAGNOSIS — K219 Gastro-esophageal reflux disease without esophagitis: Secondary | ICD-10-CM | POA: Diagnosis present

## 2018-09-26 DIAGNOSIS — Z885 Allergy status to narcotic agent status: Secondary | ICD-10-CM

## 2018-09-26 DIAGNOSIS — R110 Nausea: Secondary | ICD-10-CM | POA: Diagnosis not present

## 2018-09-26 DIAGNOSIS — Z8249 Family history of ischemic heart disease and other diseases of the circulatory system: Secondary | ICD-10-CM

## 2018-09-26 DIAGNOSIS — D62 Acute posthemorrhagic anemia: Secondary | ICD-10-CM | POA: Diagnosis not present

## 2018-09-26 DIAGNOSIS — I200 Unstable angina: Secondary | ICD-10-CM | POA: Diagnosis not present

## 2018-09-26 DIAGNOSIS — Z888 Allergy status to other drugs, medicaments and biological substances status: Secondary | ICD-10-CM

## 2018-09-26 DIAGNOSIS — E785 Hyperlipidemia, unspecified: Secondary | ICD-10-CM | POA: Diagnosis present

## 2018-09-26 DIAGNOSIS — Z6825 Body mass index (BMI) 25.0-25.9, adult: Secondary | ICD-10-CM

## 2018-09-26 DIAGNOSIS — J309 Allergic rhinitis, unspecified: Secondary | ICD-10-CM | POA: Diagnosis present

## 2018-09-26 DIAGNOSIS — Z951 Presence of aortocoronary bypass graft: Secondary | ICD-10-CM

## 2018-09-26 DIAGNOSIS — Z7902 Long term (current) use of antithrombotics/antiplatelets: Secondary | ICD-10-CM

## 2018-09-26 DIAGNOSIS — Z880 Allergy status to penicillin: Secondary | ICD-10-CM

## 2018-09-26 DIAGNOSIS — I25110 Atherosclerotic heart disease of native coronary artery with unstable angina pectoris: Secondary | ICD-10-CM

## 2018-09-26 DIAGNOSIS — I2510 Atherosclerotic heart disease of native coronary artery without angina pectoris: Secondary | ICD-10-CM | POA: Diagnosis present

## 2018-09-26 DIAGNOSIS — Z886 Allergy status to analgesic agent status: Secondary | ICD-10-CM

## 2018-09-26 DIAGNOSIS — I2584 Coronary atherosclerosis due to calcified coronary lesion: Secondary | ICD-10-CM | POA: Diagnosis present

## 2018-09-26 DIAGNOSIS — Z7951 Long term (current) use of inhaled steroids: Secondary | ICD-10-CM

## 2018-09-26 DIAGNOSIS — M4316 Spondylolisthesis, lumbar region: Secondary | ICD-10-CM | POA: Diagnosis present

## 2018-09-26 DIAGNOSIS — Z9841 Cataract extraction status, right eye: Secondary | ICD-10-CM

## 2018-09-26 DIAGNOSIS — Z9842 Cataract extraction status, left eye: Secondary | ICD-10-CM

## 2018-09-26 DIAGNOSIS — I252 Old myocardial infarction: Secondary | ICD-10-CM

## 2018-09-26 DIAGNOSIS — F329 Major depressive disorder, single episode, unspecified: Secondary | ICD-10-CM | POA: Diagnosis present

## 2018-09-26 DIAGNOSIS — R7303 Prediabetes: Secondary | ICD-10-CM | POA: Diagnosis present

## 2018-09-26 DIAGNOSIS — Z79899 Other long term (current) drug therapy: Secondary | ICD-10-CM

## 2018-09-26 DIAGNOSIS — Z23 Encounter for immunization: Secondary | ICD-10-CM

## 2018-09-26 DIAGNOSIS — R000 Tachycardia, unspecified: Secondary | ICD-10-CM | POA: Diagnosis not present

## 2018-09-26 DIAGNOSIS — J9811 Atelectasis: Secondary | ICD-10-CM | POA: Diagnosis not present

## 2018-09-26 DIAGNOSIS — M8580 Other specified disorders of bone density and structure, unspecified site: Secondary | ICD-10-CM | POA: Diagnosis present

## 2018-09-26 DIAGNOSIS — E8770 Fluid overload, unspecified: Secondary | ICD-10-CM | POA: Diagnosis not present

## 2018-09-26 DIAGNOSIS — Z91013 Allergy to seafood: Secondary | ICD-10-CM

## 2018-09-26 LAB — COMPREHENSIVE METABOLIC PANEL
ALT: 20 U/L (ref 0–44)
AST: 32 U/L (ref 15–41)
Albumin: 3.4 g/dL — ABNORMAL LOW (ref 3.5–5.0)
Alkaline Phosphatase: 93 U/L (ref 38–126)
Anion gap: 10 (ref 5–15)
BUN: 11 mg/dL (ref 8–23)
CO2: 25 mmol/L (ref 22–32)
Calcium: 9.7 mg/dL (ref 8.9–10.3)
Chloride: 101 mmol/L (ref 98–111)
Creatinine, Ser: 0.83 mg/dL (ref 0.44–1.00)
GFR calc Af Amer: >60 mL/min (ref >60)
GFR calc non Af Amer: >60 mL/min (ref >60)
Glucose, Bld: 113 mg/dL — ABNORMAL HIGH (ref 70–99)
Potassium: 4 mmol/L (ref 3.5–5.1)
Sodium: 136 mmol/L (ref 135–145)
Total Bilirubin: 0.6 mg/dL (ref 0.3–1.2)
Total Protein: 9 g/dL — ABNORMAL HIGH (ref 6.5–8.1)

## 2018-09-26 LAB — PROTIME-INR
INR: 1.06
Prothrombin Time: 13.7 seconds (ref 11.4–15.2)

## 2018-09-26 LAB — CBC
HEMATOCRIT: 34.9 % — AB (ref 36.0–46.0)
Hemoglobin: 10.8 g/dL — ABNORMAL LOW (ref 12.0–15.0)
MCH: 26.7 pg (ref 26.0–34.0)
MCHC: 30.9 g/dL (ref 30.0–36.0)
MCV: 86.4 fL (ref 80.0–100.0)
Platelets: 591 10*3/uL — ABNORMAL HIGH (ref 150–400)
RBC: 4.04 MIL/uL (ref 3.87–5.11)
RDW: 17.2 % — AB (ref 11.5–15.5)
WBC: 10.3 10*3/uL (ref 4.0–10.5)
nRBC: 0 % (ref 0.0–0.2)

## 2018-09-26 LAB — TROPONIN I
Troponin I: <0.03 ng/mL (ref <0.03)
Troponin I: <0.03 ng/mL (ref <0.03)

## 2018-09-26 LAB — I-STAT TROPONIN, ED: Troponin i, poc: 0 ng/mL (ref 0.00–0.08)

## 2018-09-26 LAB — MRSA PCR SCREENING: MRSA by PCR: NEGATIVE

## 2018-09-26 MED ORDER — SODIUM CHLORIDE 0.9 % WEIGHT BASED INFUSION
1.0000 mL/kg/h | INTRAVENOUS | Status: DC
  Administered 2018-09-26: 1 mL/kg/h via INTRAVENOUS

## 2018-09-26 MED ORDER — ONDANSETRON HCL 4 MG/2ML IJ SOLN
4.0000 mg | Freq: Four times a day (QID) | INTRAMUSCULAR | Status: DC | PRN
  Administered 2018-09-27 (×2): 4 mg via INTRAVENOUS
  Filled 2018-09-26 (×2): qty 2

## 2018-09-26 MED ORDER — NITROGLYCERIN 0.4 MG SL SUBL
0.4000 mg | SUBLINGUAL_TABLET | SUBLINGUAL | Status: DC | PRN
  Administered 2018-09-26 – 2018-10-01 (×2): 0.4 mg via SUBLINGUAL
  Filled 2018-09-26 (×3): qty 1

## 2018-09-26 MED ORDER — HEPARIN BOLUS VIA INFUSION
3500.0000 [IU] | Freq: Once | INTRAVENOUS | Status: AC
  Administered 2018-09-26: 3500 [IU] via INTRAVENOUS
  Filled 2018-09-26: qty 3500

## 2018-09-26 MED ORDER — SODIUM CHLORIDE 0.9% FLUSH: 3.0000 mL | INTRAVENOUS | Status: DC | PRN

## 2018-09-26 MED ORDER — ALPRAZOLAM 0.25 MG PO TABS
0.2500 mg | ORAL_TABLET | Freq: Two times a day (BID) | ORAL | Status: DC | PRN
  Administered 2018-09-28: 0.25 mg via ORAL
  Filled 2018-09-26: qty 1

## 2018-09-26 MED ORDER — ZOLPIDEM TARTRATE 5 MG PO TABS: 5.0000 mg | ORAL_TABLET | Freq: Every evening | ORAL | Status: DC | PRN

## 2018-09-26 MED ORDER — HEPARIN (PORCINE) IN NACL 100-0.45 UNIT/ML-% IJ SOLN
1100.0000 [IU]/h | INTRAMUSCULAR | Status: DC
  Administered 2018-09-26: 650 [IU]/h via INTRAVENOUS
  Administered 2018-09-28: 950 [IU]/h via INTRAVENOUS
  Administered 2018-09-29 – 2018-09-30 (×2): 1050 [IU]/h via INTRAVENOUS
  Administered 2018-10-01 – 2018-10-03 (×4): 1100 [IU]/h via INTRAVENOUS
  Filled 2018-09-26 (×8): qty 250

## 2018-09-26 MED ORDER — NITROGLYCERIN 0.4 MG SL SUBL
0.4000 mg | SUBLINGUAL_TABLET | SUBLINGUAL | Status: DC | PRN
  Administered 2018-09-26: 0.4 mg via SUBLINGUAL

## 2018-09-26 MED ORDER — ACETAMINOPHEN 325 MG PO TABS: 650.0000 mg | ORAL_TABLET | ORAL | Status: DC | PRN

## 2018-09-26 MED ORDER — CLOPIDOGREL BISULFATE 75 MG PO TABS
75.0000 mg | ORAL_TABLET | Freq: Every day | ORAL | Status: DC
  Administered 2018-09-27: 75 mg via ORAL
  Filled 2018-09-26: qty 1

## 2018-09-26 NOTE — Telephone Encounter (Signed)
Received call directly from operator for patient with chest pain x 2 days. Patient states she had chest pain yesterday which was relieved by NTG but returned later. She states pain is in chest and sometimes goes down left side of her neck, similar to pain she has had in the past. She does not think pain is related to past shoulder or back problems. Denies difficulty breathing. She states she has not taken any of her medications yet this morning. She does not want to go to the hospital. I advised her to go ahead and take her Ranexa, Imdur, and SL NTG if needed and that I will work on getting her an appointment if APP feels that is appropriate. She verbalized understanding and agreement and is aware I will call her back.  I spoke with Kerin Ransom, PA who advised patient may be scheduled to see him at 1:30 today. Patient aware of plan and she thanked me for the call.

## 2018-09-26 NOTE — H&P (Addendum)
Cardiology Admission History and Physical:   Patient ID: Cassie Campbell MRN: [id number]; DOB: 01/22/1941   Admission date: 09/26/2018  Primary Care Provider: Tanda Rockers, MD Primary Cardiologist: Dr Claiborne Billings  Chief Complaint:  Chest arm and back pain  Patient Profile:   Cassie Campbell is a 77 y.o. female with known CAD who was seen in the office 09/26/18 with chest pain.   History of Present Illness:   Cassie Campbell is a pleasant 77 y/o widowed female, lives alone, followed by Dr Claiborne Billings with a history of complex CAD, s/p unsuccessful attempt at PCI Oct 2015. She has been doing well on medical rx since. She has asthma and is followed by [facility]. She has treated HLD and a chronic RBBB. Today she is seen as an add on with complaints of chest pain. The patient describes two days of intermittent mid sternal chest fullness, Lt shoulder pain, and sub scapular pain. This is worse after eating and with exertion. She took NTG twice today earlier with relief but her symptoms recurred. While getting ready to come to the office today she broke out into a cold sweat.    Past Medical History:  Diagnosis Date  . Allergic rhinitis   . Arthritis   . Asthma    xolair s 8/05 ?11/07; mastered hfa 12/20/08  . Benign positional vertigo   . CAD (coronary artery disease)    a. 09/2014 NSTEMI s/p LHC with sig 2V dz. dLAD diffusely diseased and not suitable for PCI. unsuccessful RCA PCI d/t heavy calcifications  . Depression   . Dyspnea   . GERD (gastroesophageal reflux disease)   . Heart attack ([place])    09/2014  . Hyperlipidemia    <130 ldl pos fm hx, bp  . Hypertension   . Obesity   . Osteopenia    dexa 08/22/07 AP spine + 1.1, left femur -1.3, right femur -.8; dexa 10/06/09 +1.6, left femur =1.6, right femur -.  . PONV (postoperative nausea and vomiting)   . Ruptured disc, cervical   . 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 BREAST SURGERY  10-26-10   Rt. breast bx--for microcalcifications in rt. retroareolar region--dx was hyalinized fibroadenoma  . BUNIONECTOMY Bilateral   . CARDIAC CATHETERIZATION     2015  . CATARACT EXTRACTION Right 02/02/2018   Dr Satira Sark  . CATARACT EXTRACTION Left 03/30/2018  . [place] SURGERY  12/01  . LEFT HEART CATHETERIZATION WITH CORONARY ANGIOGRAM N/A 10/16/2014   Procedure: LEFT HEART CATHETERIZATION WITH CORONARY ANGIOGRAM;  Surgeon: Blane Ohara, MD;  Location[facility] LAB;  Service: Cardiovascular;  Laterality: N/A;  . PERCUTANEOUS CORONARY ROTOBLATOR INTERVENTION (PCI-R) N/A 10/17/2014   Procedure: PERCUTANEOUS CORONARY ROTOBLATOR INTERVENTION (PCI-R);  Surgeon: Troy Sine, MD;  Location: [facility] CATH LAB;  Service: Cardiovascular;  Laterality: N/A;  . VEIN LIGATION AND STRIPPING    . VIDEO BRONCHOSCOPY Bilateral 02/05/2015   Procedure: VIDEO BRONCHOSCOPY WITHOUT FLUORO;  Surgeon: Tanda Rockers, MD;  Location: [facility] ENDOSCOPY;  Service: Endoscopy;  Laterality: Bilateral;  . VULVA /PERINEUM BIOPSY  12-30-10   --epidermoid cyst     Medications Prior to Admission: Prior to Admission medications   Medication Sig Start Date End Date Taking? Authorizing Provider  amLODipine (NORVASC) 10 MG tablet TAKE 1 TABLET BY MOUTH EVERY DAY 08/08/18   Tanda Rockers, MD  atorvastatin (LIPITOR) 80 MG tablet TAKE 1  TABLET(80 MG) BY MOUTH DAILY 06/20/18   Troy Sine, MD  bisoprolol (ZEBETA) 5 MG tablet TAKE 1 TABLET(5 MG) BY MOUTH DAILY 07/17/18   Tanda Rockers, MD  budesonide-formoterol [facility]) 160-4.5 MCG/ACT inhaler Inhale 2 puffs into the lungs 2 (two) times daily. 03/08/18   Parrett, Fonnie Mu, NP  celecoxib (CELEBREX) 200 MG capsule TAKE ONE CAPSULE BY MOUTH EVERY 12 HOURS 09/14/18   Jessy Oto, MD  cetirizine (ZYRTEC) 10 MG tablet Take 10 mg by mouth at bedtime as needed (itching and sneezing).     [provider]  cholecalciferol 2000  units TABS Take 1 tablet (2,000 Units total) by mouth daily. 08/18/17   Jessy Oto, MD  clopidogrel (PLAVIX) 75 MG tablet TAKE 1 TABLET BY MOUTH EVERY DAY 07/17/18   Tanda Rockers, MD  clotrimazole-betamethasone (LOTRISONE) cream Apply 1 application topically as needed (itching). Use as directed for rash    [provider]  dextromethorphan-guaiFENesin (MUCINEX DM) 30-600 MG per 12 hr tablet Take 1-2 tablets by mouth every 12 (twelve) hours as needed for cough (with flutter).     [provider]  ezetimibe (ZETIA) 10 MG tablet Take 1 tablet (10 mg total) by mouth daily. 06/15/18   Troy Sine, MD  famotidine (PEPCID) 20 MG tablet Take 20 mg by mouth at bedtime.     [provider]  fluticasone (FLONASE) 50 MCG/ACT nasal spray Place 2 sprays into both nostrils 2 (two) times daily as needed for allergies or rhinitis.     [provider]  furosemide (LASIX) 20 MG tablet TAKE 1 TABLET(20 MG) BY MOUTH DAILY 07/17/18   Tanda Rockers, MD  isosorbide mononitrate (IMDUR) 60 MG 24 hr tablet TAKE 1 AND 1/2 TABLETS BY MOUTH EVERY MORNING AND 1/2 TABLET EVERY EVENING 09/19/18   Troy Sine, MD  meclizine (ANTIVERT) 25 MG tablet TAKE 2 TABLETS BY MOUTH THREE TIMES DAILY AS NEEDED Patient taking differently: TAKE 2 TABLETS BY MOUTH THREE TIMES DAILY AS NEEDED FOR DIZZNESS 04/08/17   Tanda Rockers, MD  montelukast (SINGULAIR) 10 MG tablet TAKE 1 TABLET(10 MG) BY MOUTH AT BEDTIME 07/17/18   Tanda Rockers, MD  Multiple Vitamin (MULTIVITAMIN) capsule Take 1 capsule by mouth daily.     [provider]  nitroGLYCERIN (NITROSTAT) 0.4 MG SL tablet Place 0.4 mg under the tongue every 5 (five) minutes as needed for chest pain (may repeat x3).    [provider]  omeprazole (PRILOSEC) 20 MG capsule Take 20 mg by mouth daily before breakfast.     [provider]  oxymetazoline (AFRIN) 0.05 % nasal spray Place 2 sprays into both nostrils 2 (two) times  daily as needed for congestion. as needed for stuffy nose and sinus problems    [provider]  PROAIR HFA 108 (90 Base) MCG/ACT inhaler INHALE 1 TO 2 PUFFS BY MOUTH EVERY 4 HOURS AS NEEDED FOR WHEEZING 02/10/17   Tanda Rockers, MD  ranolazine (RANEXA) 500 MG 12 hr tablet TAKE 1 TABLET(500 MG) BY MOUTH TWICE DAILY 06/20/18   Troy Sine, MD  sodium chloride (OCEAN) 0.65 % nasal spray Place 2 sprays into the nose every 4 (four) hours as needed (nasal congestion).     [provider]  SYMBICORT 160-4.5 MCG/ACT inhaler INHALE 2 PUFFS BY MOUTH EVERY 12 HOURS 09/18/18   Tanda Rockers, MD     Allergies:    Allergies  Allergen Reactions  . Fish-Derived  Products Shortness Of Breath, Swelling and Rash  . Penicillins Shortness Of Breath, Swelling and Rash    PATIENT HAS HAD A PCN REACTION WITH IMMEDIATE RASH, FACIAL/TONGUE/THROAT SWELLING, SOB, OR LIGHTHEADEDNESS WITH HYPOTENSION:  #  #  #  YES  #  #  #   Has patient had a PCN reaction causing severe rash involving mucus membranes or skin necrosis: No PATIENT HAS HAD A PCN REACTION THAT REQUIRED HOSPITALIZATION:  #  #  #  YES  #  #  #   Has patient had a PCN reaction occurring within the last 10 years: No    . Aspirin Swelling and Rash    SWELLING REACTION UNSPECIFIED   . Brilinta [Ticagrelor] Rash    Started Brilinta 09/2014 - rash - unsure which it is directly related to  . Codeine Phosphate Nausea Only  . Diphenhydramine Hcl Rash    Social History:   Social History   Socioeconomic History  . Marital status: Married    Spouse name: Not on file  . Number of children: Not on file  . Years of education: Not on file  . Highest education level: Not on file  Occupational History  . Occupation: retired Tour manager  Social Needs  . Financial resource strain: Not on file  . Food insecurity:    Worry: Not on file    Inability: Not on file  . Transportation needs:    Medical: Not on file    Non-medical: Not  on file  Tobacco Use  . Smoking status: Never Smoker  . Smokeless tobacco: Never Used  Substance and Sexual Activity  . Alcohol use: No    Alcohol/week: 0.0 standard drinks  . Drug use: No  . Sexual activity: Not Currently    Partners: Male    Birth control/protection: Post-menopausal  Lifestyle  . Physical activity:    Days per week: Not on file    Minutes per session: Not on file  . Stress: Not on file  Relationships  . Social connections:    Talks on phone: Not on file    Gets together: Not on file    Attends religious service: Not on file    Active member of club or organization: Not on file    Attends meetings of clubs or organizations: Not on file    Relationship status: Not on file  . Intimate partner violence:    Fear of current or ex partner: Not on file    Emotionally abused: Not on file    Physically abused: Not on file    Forced sexual activity: Not on file  Other Topics Concern  . Not on file  Social History Narrative  . Not on file    Family History:   The patient's family history includes Breast cancer in her sister; Breast cancer (age of onset: 7) in her daughter; Diabetes in her mother and sister; Heart disease in her father; Hypertension in her mother and sister; Stroke in her mother.    ROS:  Please see the history of present illness.  All other ROS reviewed and negative.     Physical Exam/Data:   Vitals:   09/26/18 1444 09/26/18 1450 09/26/18 1451  BP:  102/71   Pulse:  87 86  Resp:  12 17  SpO2:  96% 95%  Weight: 56.7 kg    Height: 4\' 11"  (1.499 m)     No intake or output data in the 24 hours ending 09/26/18 [place]  09/26/18 1444  Weight: 56.7 kg   Body mass index is 25.25 kg/m.  General:  Well nourished, well developed, in no acute distress HEENT: normal Lymph: no adenopathy Neck: noJVD Endocrine:  No thryomegaly Vascular: No carotid bruits; FA pulses 2+ bilaterally without bruits  Cardiac:  normal S1, S2; RRR; no  murmur  Lungs:  clear to auscultation bilaterally, no wheezing, rhonchi or rales  Abd: soft, nontender, no hepatomegaly  Ext: no edema Musculoskeletal:  No deformities, BUE and BLE strength normal and equal Skin: warm and dry  Neuro:  CNs 2-12 intact, no focal abnormalities noted Psych:  Normal affect    EKG:  The ECG that was done 09/26/18 was personally reviewed and demonstrates  NSR, RBBB (old)  Laboratory Data:  ChemistryNo results for input(s): NA, K, CL, CO2, GLUCOSE, BUN, CREATININE, CALCIUM, GFRNONAA, GFRAA, ANIONGAP in the last 168 hours.  No results for input(s): PROT, ALBUMIN, AST, ALT, ALKPHOS, BILITOT in the last 168 hours. HematologyNo results for input(s): WBC, RBC, HGB, HCT, MCV, MCH, MCHC, RDW, PLT in the last 168 hours. Cardiac EnzymesNo results for input(s): TROPONINI in the last 168 hours. No results for input(s): TROPIPOC in the last 168 hours.  BNPNo results for input(s): BNP, PROBNP in the last 168 hours.  DDimer No results for input(s): DDIMER in the last 168 hours.  Radiology/Studies:  No results found.  Assessment and Plan:   Unstable angina (HCC) Pt presents today to the office as an add on with a history of 48 hours of intermittent nitrate responsive chest pain.  CAD (coronary artery disease) 09/2014 NSTEMI s/p LHC with sig 2V dz. dLAD diffusely diseased and not suitable for PCI. unsuccessful RCA PCI d/t heavy calcifications  Essential hypertension Controlled  Moderate persistent asthma in adult without complication Followed by Dr Melvyn Novas  Hyperlipidemia with target LDL less than 70 LDL 63 March 2019 on high dose statin Rx   Severity of Illness: The appropriate patient status for this patient is OBSERVATION. Observation status is judged to be reasonable and necessary in order to provide the required intensity of service to ensure the patient's safety. The patient's presenting symptoms, physical exam findings, and initial radiographic and  laboratory data in the context of their medical condition is felt to place them at decreased risk for further clinical deterioration. Furthermore, it is anticipated that the patient will be medically stable for discharge from the hospital within 2 midnights of admission. The following factors support the patient status of observation.   " The patient's presenting symptoms include chest pain. " The physical exam findings include normal. " The initial radiographic and laboratory data are  pending.     For questions or updates, please contact [place] Please consult [email or web address] for contact info under        Signed, Kerin Ransom, PA-C  09/26/2018 3:00 PM   Agree with note written by Kerin Ransom [facility]  Patient with known CAD now with recurrent accelerated angina nitrate responsive.  She is having some chest pain today rating to her shoulder.  I agree with EMS transport to [facility] and hospitalization with cardiac catheterization.    Quay Burow 09/26/2018 3:54 PM

## 2018-09-26 NOTE — Progress Notes (Signed)
ANTICOAGULATION CONSULT NOTE - Initial Consult  Pharmacy Consult for Heparin Indication: chest pain/ACS  Allergies  Allergen Reactions  . Fish-Derived Products Shortness Of Breath, Swelling and Rash  . Penicillins Shortness Of Breath, Swelling and Rash    PATIENT HAS HAD A PCN REACTION WITH IMMEDIATE RASH, FACIAL/TONGUE/THROAT SWELLING, SOB, OR LIGHTHEADEDNESS WITH HYPOTENSION:  #  #  #  YES  #  #  #   Has patient had a PCN reaction causing severe rash involving mucus membranes or skin necrosis: No PATIENT HAS HAD A PCN REACTION THAT REQUIRED HOSPITALIZATION:  #  #  #  YES  #  #  #   Has patient had a PCN reaction occurring within the last 10 years: No    . Aspirin Swelling and Rash    SWELLING REACTION UNSPECIFIED   . Brilinta [Ticagrelor] Rash    Started Brilinta 09/2014 - rash - unsure which it is directly related to  . Codeine Phosphate Nausea Only  . Diphenhydramine Hcl Rash    Patient Measurements: Height: 4\' 11"  (149.9 cm) Weight: 125 lb (56.7 kg) IBW/kg (Calculated) : 43.2 Heparin Dosing Weight: 54.8 kg  Vital Signs: BP: 102/71 (10/08 1450) Pulse Rate: 86 (10/08 1451)  Labs: No results for input(s): HGB, HCT, PLT, APTT, LABPROT, INR, HEPARINUNFRC, HEPRLOWMOCWT, CREATININE, CKTOTAL, CKMB, TROPONINI in the last 72 hours.  CrCl cannot be calculated (Patient's most recent lab result is older than the maximum 21 days allowed.).   Medical History: Past Medical History:  Diagnosis Date  . Allergic rhinitis   . Arthritis   . Asthma    xolair s 8/05 ?11/07; mastered hfa 12/20/08  . Benign positional vertigo   . CAD (coronary artery disease)    a. 09/2014 NSTEMI s/p LHC with sig 2V dz. dLAD diffusely diseased and not suitable for PCI. unsuccessful RCA PCI d/t heavy calcifications  . Depression   . Dyspnea   . GERD (gastroesophageal reflux disease)   . Heart attack ([place])    09/2014  . Hyperlipidemia    <130 ldl pos fm hx, bp  . Hypertension   . Obesity   .  Osteopenia    dexa 08/22/07 AP spine + 1.1, left femur -1.3, right femur -.8; dexa 10/06/09 +1.6, left femur =1.6, right femur -.  . PONV (postoperative nausea and vomiting)   . Ruptured disc, cervical   . 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 Pharmacy consulted to start Heparin for ACS.   The patient was not on anticoagulation PTA. No recent surgeries or CVA w/in 3 months noted. Hgb 10.8, plts 591.   Goal of Therapy:  Heparin level 0.3-0.7 units/ml Monitor platelets by anticoagulation protocol: Yes   Plan:  - Heparin 3500 unit bolus x 1 - Start Heparin at a drip rate of 650 units/hr (6.5 ml/hr) - Will continue to monitor for any signs/symptoms of bleeding and will follow up with heparin level in 8 hours   Thank you for allowing pharmacy to be a part of this patient's care.  Alycia Rossetti, PharmD, BCPS Clinical Pharmacist Pager: [phone] Clinical phone for 09/26/2018: [phone] Please check AMION for all [place] numbers 09/26/2018 3:16 PM

## 2018-09-26 NOTE — ED Provider Notes (Signed)
MSE was initiated and I personally evaluated the patient and placed orders (if any) at  3:09 PM on September 26, 2018.  Cassie Campbell is a 77 y.o. female with a history of CAD, MI, hypertension, hyperlipidemia, asthma and GERD, who presents from her cardiologist's office for evaluation of left-sided chest pain for the past 2 days, she was seen by Kerin Ransom and Dr. Alvester Chou in the office and they plan to admit for chest pain work-up and possible cath.  She has had 2 days of intermittent left-sided chest pain with associated diaphoresis and some nausea but no vomiting.  She reports feeling headed with exertion yesterday but denies any shortness of breath, chest pain is not pleuritic in nature.  It is present at rest and with exertion.  She reports history of GERD as well and tried some Maalox without improvement she did take nitroglycerin yesterday with some improvement but symptoms returned.  Her outpatient cardiology note from today patient was brought over via EMS with plans for admission and likely heart cath.  Physical Exam  Constitutional: She is oriented to person, place, and time. She appears well-developed and well-nourished. No distress.  HENT:  Mouth/Throat: Oropharynx is clear and moist.  Eyes: Pupils are equal, round, and reactive to light. Right eye exhibits no discharge. Left eye exhibits no discharge.  Neck: Neck supple.  Cardiovascular: Normal rate, regular rhythm, normal heart sounds and intact distal pulses. Exam reveals no gallop and no friction rub.  No murmur heard. Respiratory: Effort normal and breath sounds normal. No respiratory distress. She has no wheezes. She has no rales. She exhibits no tenderness.  Respirations equal and unlabored, patient able to speak in full sentences, lungs clear to auscultation bilaterally  GI: Soft. Bowel sounds are normal. She exhibits no distension. There is no tenderness. There is no rebound and no guarding.  Abdomen soft, nondistended,  nontender to palpation in all quadrants without guarding or peritoneal signs  Musculoskeletal: She exhibits no edema.  Neurological: She is alert and oriented to person, place, and time.  Skin: Skin is warm. She is diaphoretic.     Patient is being direct admitted by Cardiology, and they will continue patients evaluation and care.   Jacqlyn Larsen, PA-C 09/26/18 1515    Dorie Rank, MD 09/27/18 [phone]

## 2018-09-26 NOTE — Assessment & Plan Note (Signed)
Controlled.  

## 2018-09-26 NOTE — ED Triage Notes (Signed)
Pt in via [place] [facility] from [place] today for mid radiating to R shoulder  CP onset Sunday intermittently, hx of MI in 2015, pt takes Plavix daily, pt denies SOB, c/o nausea with CP intermittently with diaphoresis, denies symptoms at this time, A&O x4

## 2018-09-26 NOTE — ED Notes (Signed)
Pt now back from XRAY  

## 2018-09-26 NOTE — Progress Notes (Signed)
09/26/2018 Cassie Campbell   03-Dec-1941  [id number]  Primary Physician Melvyn Novas Christena Deem, MD Primary Cardiologist: Dr Claiborne Billings  HPI:  Pleasant 77 y/o widowed female, lives alone, followed by Dr Claiborne Billings with a history of complex CAD, s/p unsuccessful attempt at PCI Oct 2015. She has been doing well on medical rx since. She has asthma and is followed by [facility]. She has treated HLD and a chronic RBBB. Today she is seen as an add on with complaints of chest pain. The patient describes two days of intermittent mid sternal chest fullness, Lt shoulder pain, and sub scapular pain. This is worse after eating and with exertion. She took NTG twice today earlier with relief but her symptoms recurred. While getting ready to come to the office today she broke out into a cold sweat.    Current Outpatient Medications  Medication Sig Dispense Refill  . amLODipine (NORVASC) 10 MG tablet TAKE 1 TABLET BY MOUTH EVERY DAY 90 tablet 1  . atorvastatin (LIPITOR) 80 MG tablet TAKE 1 TABLET(80 MG) BY MOUTH DAILY 90 tablet 1  . bisoprolol (ZEBETA) 5 MG tablet TAKE 1 TABLET(5 MG) BY MOUTH DAILY 90 tablet 0  . budesonide-formoterol (SYMBICORT) 160-4.5 MCG/ACT inhaler Inhale 2 puffs into the lungs 2 (two) times daily. 1 Inhaler 0  . celecoxib (CELEBREX) 200 MG capsule TAKE ONE CAPSULE BY MOUTH EVERY 12 HOURS 40 capsule 0  . cetirizine (ZYRTEC) 10 MG tablet Take 10 mg by mouth at bedtime as needed (itching and sneezing).     . cholecalciferol 2000 units TABS Take 1 tablet (2,000 Units total) by mouth daily. 30 tablet 3  . clopidogrel (PLAVIX) 75 MG tablet TAKE 1 TABLET BY MOUTH EVERY DAY 90 tablet 0  . clotrimazole-betamethasone (LOTRISONE) cream Apply 1 application topically as needed (itching). Use as directed for rash    . dextromethorphan-guaiFENesin (MUCINEX DM) 30-600 MG per 12 hr tablet Take 1-2 tablets by mouth every 12 (twelve) hours as needed for cough (with flutter).     . ezetimibe (ZETIA) 10 MG tablet  Take 1 tablet (10 mg total) by mouth daily. 90 tablet 3  . famotidine (PEPCID) 20 MG tablet Take 20 mg by mouth at bedtime.     . fluticasone (FLONASE) 50 MCG/ACT nasal spray Place 2 sprays into both nostrils 2 (two) times daily as needed for allergies or rhinitis.     . furosemide (LASIX) 20 MG tablet TAKE 1 TABLET(20 MG) BY MOUTH DAILY 90 tablet 0  . isosorbide mononitrate (IMDUR) 60 MG 24 hr tablet TAKE 1 AND 1/2 TABLETS BY MOUTH EVERY MORNING AND 1/2 TABLET EVERY EVENING 180 tablet 3  . meclizine (ANTIVERT) 25 MG tablet TAKE 2 TABLETS BY MOUTH THREE TIMES DAILY AS NEEDED (Patient taking differently: TAKE 2 TABLETS BY MOUTH THREE TIMES DAILY AS NEEDED FOR DIZZNESS) 24 tablet 0  . montelukast (SINGULAIR) 10 MG tablet TAKE 1 TABLET(10 MG) BY MOUTH AT BEDTIME 90 tablet 0  . Multiple Vitamin (MULTIVITAMIN) capsule Take 1 capsule by mouth daily.     . nitroGLYCERIN (NITROSTAT) 0.4 MG SL tablet Place 0.4 mg under the tongue every 5 (five) minutes as needed for chest pain (may repeat x3).    Marland Kitchen omeprazole (PRILOSEC) 20 MG capsule Take 20 mg by mouth daily before breakfast.     . oxymetazoline (AFRIN) 0.05 % nasal spray Place 2 sprays into both nostrils 2 (two) times daily as needed for congestion. as needed for stuffy nose and sinus problems    .  PROAIR HFA 108 (90 Base) MCG/ACT inhaler INHALE 1 TO 2 PUFFS BY MOUTH EVERY 4 HOURS AS NEEDED FOR WHEEZING 8.5 g 5  . ranolazine (RANEXA) 500 MG 12 hr tablet TAKE 1 TABLET(500 MG) BY MOUTH TWICE DAILY 60 tablet 5  . sodium chloride (OCEAN) 0.65 % nasal spray Place 2 sprays into the nose every 4 (four) hours as needed (nasal congestion).     . SYMBICORT 160-4.5 MCG/ACT inhaler INHALE 2 PUFFS BY MOUTH EVERY 12 HOURS 10.2 g 0   No current facility-administered medications for this visit.     Allergies  Allergen Reactions  . Fish-Derived Products Shortness Of Breath, Swelling and Rash  . Penicillins Shortness Of Breath, Swelling and Rash    PATIENT HAS HAD A  PCN REACTION WITH IMMEDIATE RASH, FACIAL/TONGUE/THROAT SWELLING, SOB, OR LIGHTHEADEDNESS WITH HYPOTENSION:  #  #  #  YES  #  #  #   Has patient had a PCN reaction causing severe rash involving mucus membranes or skin necrosis: No PATIENT HAS HAD A PCN REACTION THAT REQUIRED HOSPITALIZATION:  #  #  #  YES  #  #  #   Has patient had a PCN reaction occurring within the last 10 years: No    . Aspirin Swelling and Rash    SWELLING REACTION UNSPECIFIED   . Brilinta [Ticagrelor] Rash    Started Brilinta 09/2014 - rash - unsure which it is directly related to  . Codeine Phosphate Nausea Only  . Diphenhydramine Hcl Rash    Past Medical History:  Diagnosis Date  . Allergic rhinitis   . Arthritis   . Asthma    xolair s 8/05 ?11/07; mastered hfa 12/20/08  . Benign positional vertigo   . CAD (coronary artery disease)    a. 09/2014 NSTEMI s/p LHC with sig 2V dz. dLAD diffusely diseased and not suitable for PCI. unsuccessful RCA PCI d/t heavy calcifications  . Depression   . Dyspnea   . GERD (gastroesophageal reflux disease)   . Heart attack ([place])    09/2014  . Hyperlipidemia    <130 ldl pos fm hx, bp  . Hypertension   . Obesity   . Osteopenia    dexa 08/22/07 AP spine + 1.1, left femur -1.3, right femur -.8; dexa 10/06/09 +1.6, left femur =1.6, right femur -.  . PONV (postoperative nausea and vomiting)   . Ruptured disc, cervical   . 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 Marital status: Married    Spouse name: Not on file  . Number of children: Not on file  . Years of education: Not on file  . Highest education level: Not on file  Occupational History  . Occupation: retired Tour manager  Social Needs  . Financial resource strain: Not on file  . Food insecurity:    Worry: Not on file    Inability: Not on file  . Transportation needs:    Medical: Not on file    Non-medical: Not on file  Tobacco Use   . Smoking status: Never Smoker  . Smokeless tobacco: Never Used  Substance and Sexual Activity  . Alcohol use: No    Alcohol/week: 0.0 standard drinks  . Drug use: No  . Sexual activity: Not Currently    Partners: Male    Birth control/protection: Post-menopausal  Lifestyle  . Physical activity:    Days per week: Not on file  Minutes per session: Not on file  . Stress: Not on file  Relationships  . Social connections:    Talks on phone: Not on file    Gets together: Not on file    Attends religious service: Not on file    Active member of club or organization: Not on file    Attends meetings of clubs or organizations: Not on file    Relationship status: Not on file  . Intimate partner violence:    Fear of current or ex partner: Not on file    Emotionally abused: Not on file    Physically abused: Not on file    Forced sexual activity: Not on file  Other Topics Concern  . Not on file  Social History Narrative  . Not on file     Family History  Problem Relation Age of Onset  . Diabetes Mother        AODM  . Stroke Mother   . Hypertension Mother   . Heart disease Father   . Diabetes Sister        AODM  . Breast cancer Sister   . Hypertension Sister   . Breast cancer Daughter 24       dec--mets to liver/spine     Review of Systems: General: negative for chills, fever, night sweats or weight changes.  Cardiovascular: negative for dyspnea on exertion, edema, orthopnea, palpitations, paroxysmal nocturnal dyspnea or shortness of breath Dermatological: negative for rash Respiratory: negative for cough or wheezing Urologic: negative for hematuria Abdominal: negative for nausea, vomiting, diarrhea, bright red blood per rectum, melena, or hematemesis Neurologic: negative for visual changes, syncope, or dizziness All other systems reviewed and are otherwise negative except as noted above.    Blood pressure 112/72, pulse 84, height 4\' 11"  (1.499 m), weight 125 lb 8  oz (56.9 kg), last menstrual period 12/21/1999.  General appearance: alert, cooperative and no distress Neck: no carotid bruit and no JVD Lungs: clear to auscultation bilaterally Heart: regular rate and rhythm Abdomen: soft, non-tender; bowel sounds normal; no masses,  no organomegaly Extremities: no edema Pulses: good radial pulses Skin: warm and dry Neurologic: Grossly normal  EKG NSR, RBBB  ASSESSMENT AND PLAN:   Unstable angina (HCC) Pt presents today to the office as an add on with a history of 48 hours of intermittent nitrate responsive chest pain.  CAD (coronary artery disease) 09/2014 NSTEMI s/p LHC with sig 2V dz. dLAD diffusely diseased and not suitable for PCI. unsuccessful RCA PCI d/t heavy calcifications  Essential hypertension Controlled  Moderate persistent asthma in adult without complication Followed by Dr Melvyn Novas  Hyperlipidemia with target LDL less than 70 LDL 63 March 2019 on high dose statin Rx   PLAN  Pt seen by Dr Gwenlyn Found and myself in the office today. Will admit via EMS for medical Rx- IV NTG and Heparin-and consideration of cath.   Kerin Ransom PA-C 09/26/2018 1:44 PM

## 2018-09-26 NOTE — Assessment & Plan Note (Signed)
LDL 63 March 2019 on high dose statin Rx

## 2018-09-26 NOTE — Assessment & Plan Note (Signed)
Followed by Dr [name] 

## 2018-09-26 NOTE — Assessment & Plan Note (Signed)
Pt presents today to the office as an add on with a history of 48 hours of intermittent nitrate responsive chest pain.

## 2018-09-26 NOTE — Assessment & Plan Note (Signed)
09/2014 NSTEMI s/p LHC with sig 2V dz. dLAD diffusely diseased and not suitable for PCI. unsuccessful RCA PCI d/t heavy calcifications

## 2018-09-26 NOTE — Telephone Encounter (Signed)
New Message:   Pt c/o of Chest Pain: STAT if CP now or developed within 24 hours  1. Are you having CP right now? Chest Tightness   2. Are you experiencing any other symptoms (ex. SOB, nausea, vomiting, sweating)? No   3. How long have you been experiencing CP? 2 Days   4. Is your CP continuous or coming and going? Coming and Going   5. Have you taken Nitroglycerin? Yes- yesterday ?

## 2018-09-26 NOTE — H&P (View-Only) (Signed)
09/26/2018 Cassie Campbell   1941/01/11  [id number]  Primary Physician Melvyn Novas Christena Deem, MD Primary Cardiologist: Dr Claiborne Billings  HPI:  Pleasant 77 y/o widowed female, lives alone, followed by Dr Claiborne Billings with a history of complex CAD, s/p unsuccessful attempt at PCI Oct 2015. She has been doing well on medical rx since. She has asthma and is followed by [facility]. She has treated HLD and a chronic RBBB. Today she is seen as an add on with complaints of chest pain. The patient describes two days of intermittent mid sternal chest fullness, Lt shoulder pain, and sub scapular pain. This is worse after eating and with exertion. She took NTG twice today earlier with relief but her symptoms recurred. While getting ready to come to the office today she broke out into a cold sweat.    Current Outpatient Medications  Medication Sig Dispense Refill  . amLODipine (NORVASC) 10 MG tablet TAKE 1 TABLET BY MOUTH EVERY DAY 90 tablet 1  . atorvastatin (LIPITOR) 80 MG tablet TAKE 1 TABLET(80 MG) BY MOUTH DAILY 90 tablet 1  . bisoprolol (ZEBETA) 5 MG tablet TAKE 1 TABLET(5 MG) BY MOUTH DAILY 90 tablet 0  . budesonide-formoterol (SYMBICORT) 160-4.5 MCG/ACT inhaler Inhale 2 puffs into the lungs 2 (two) times daily. 1 Inhaler 0  . celecoxib (CELEBREX) 200 MG capsule TAKE ONE CAPSULE BY MOUTH EVERY 12 HOURS 40 capsule 0  . cetirizine (ZYRTEC) 10 MG tablet Take 10 mg by mouth at bedtime as needed (itching and sneezing).     . cholecalciferol 2000 units TABS Take 1 tablet (2,000 Units total) by mouth daily. 30 tablet 3  . clopidogrel (PLAVIX) 75 MG tablet TAKE 1 TABLET BY MOUTH EVERY DAY 90 tablet 0  . clotrimazole-betamethasone (LOTRISONE) cream Apply 1 application topically as needed (itching). Use as directed for rash    . dextromethorphan-guaiFENesin (MUCINEX DM) 30-600 MG per 12 hr tablet Take 1-2 tablets by mouth every 12 (twelve) hours as needed for cough (with flutter).     . ezetimibe (ZETIA) 10 MG tablet  Take 1 tablet (10 mg total) by mouth daily. 90 tablet 3  . famotidine (PEPCID) 20 MG tablet Take 20 mg by mouth at bedtime.     . fluticasone (FLONASE) 50 MCG/ACT nasal spray Place 2 sprays into both nostrils 2 (two) times daily as needed for allergies or rhinitis.     . furosemide (LASIX) 20 MG tablet TAKE 1 TABLET(20 MG) BY MOUTH DAILY 90 tablet 0  . isosorbide mononitrate (IMDUR) 60 MG 24 hr tablet TAKE 1 AND 1/2 TABLETS BY MOUTH EVERY MORNING AND 1/2 TABLET EVERY EVENING 180 tablet 3  . meclizine (ANTIVERT) 25 MG tablet TAKE 2 TABLETS BY MOUTH THREE TIMES DAILY AS NEEDED (Patient taking differently: TAKE 2 TABLETS BY MOUTH THREE TIMES DAILY AS NEEDED FOR DIZZNESS) 24 tablet 0  . montelukast (SINGULAIR) 10 MG tablet TAKE 1 TABLET(10 MG) BY MOUTH AT BEDTIME 90 tablet 0  . Multiple Vitamin (MULTIVITAMIN) capsule Take 1 capsule by mouth daily.     . nitroGLYCERIN (NITROSTAT) 0.4 MG SL tablet Place 0.4 mg under the tongue every 5 (five) minutes as needed for chest pain (may repeat x3).    Marland Kitchen omeprazole (PRILOSEC) 20 MG capsule Take 20 mg by mouth daily before breakfast.     . oxymetazoline (AFRIN) 0.05 % nasal spray Place 2 sprays into both nostrils 2 (two) times daily as needed for congestion. as needed for stuffy nose and sinus problems    .  PROAIR HFA 108 (90 Base) MCG/ACT inhaler INHALE 1 TO 2 PUFFS BY MOUTH EVERY 4 HOURS AS NEEDED FOR WHEEZING 8.5 g 5  . ranolazine (RANEXA) 500 MG 12 hr tablet TAKE 1 TABLET(500 MG) BY MOUTH TWICE DAILY 60 tablet 5  . sodium chloride (OCEAN) 0.65 % nasal spray Place 2 sprays into the nose every 4 (four) hours as needed (nasal congestion).     . SYMBICORT 160-4.5 MCG/ACT inhaler INHALE 2 PUFFS BY MOUTH EVERY 12 HOURS 10.2 g 0   No current facility-administered medications for this visit.     Allergies  Allergen Reactions  . Fish-Derived Products Shortness Of Breath, Swelling and Rash  . Penicillins Shortness Of Breath, Swelling and Rash    PATIENT HAS HAD A  PCN REACTION WITH IMMEDIATE RASH, FACIAL/TONGUE/THROAT SWELLING, SOB, OR LIGHTHEADEDNESS WITH HYPOTENSION:  #  #  #  YES  #  #  #   Has patient had a PCN reaction causing severe rash involving mucus membranes or skin necrosis: No PATIENT HAS HAD A PCN REACTION THAT REQUIRED HOSPITALIZATION:  #  #  #  YES  #  #  #   Has patient had a PCN reaction occurring within the last 10 years: No    . Aspirin Swelling and Rash    SWELLING REACTION UNSPECIFIED   . Brilinta [Ticagrelor] Rash    Started Brilinta 09/2014 - rash - unsure which it is directly related to  . Codeine Phosphate Nausea Only  . Diphenhydramine Hcl Rash    Past Medical History:  Diagnosis Date  . Allergic rhinitis   . Arthritis   . Asthma    xolair s 8/05 ?11/07; mastered hfa 12/20/08  . Benign positional vertigo   . CAD (coronary artery disease)    a. 09/2014 NSTEMI s/p LHC with sig 2V dz. dLAD diffusely diseased and not suitable for PCI. unsuccessful RCA PCI d/t heavy calcifications  . Depression   . Dyspnea   . GERD (gastroesophageal reflux disease)   . Heart attack ([place])    09/2014  . Hyperlipidemia    <130 ldl pos fm hx, bp  . Hypertension   . Obesity   . Osteopenia    dexa 08/22/07 AP spine + 1.1, left femur -1.3, right femur -.8; dexa 10/06/09 +1.6, left femur =1.6, right femur -.  . PONV (postoperative nausea and vomiting)   . Ruptured disc, cervical   . 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 Marital status: Married    Spouse name: Not on file  . Number of children: Not on file  . Years of education: Not on file  . Highest education level: Not on file  Occupational History  . Occupation: retired Tour manager  Social Needs  . Financial resource strain: Not on file  . Food insecurity:    Worry: Not on file    Inability: Not on file  . Transportation needs:    Medical: Not on file    Non-medical: Not on file  Tobacco Use   . Smoking status: Never Smoker  . Smokeless tobacco: Never Used  Substance and Sexual Activity  . Alcohol use: No    Alcohol/week: 0.0 standard drinks  . Drug use: No  . Sexual activity: Not Currently    Partners: Male    Birth control/protection: Post-menopausal  Lifestyle  . Physical activity:    Days per week: Not on file  Minutes per session: Not on file  . Stress: Not on file  Relationships  . Social connections:    Talks on phone: Not on file    Gets together: Not on file    Attends religious service: Not on file    Active member of club or organization: Not on file    Attends meetings of clubs or organizations: Not on file    Relationship status: Not on file  . Intimate partner violence:    Fear of current or ex partner: Not on file    Emotionally abused: Not on file    Physically abused: Not on file    Forced sexual activity: Not on file  Other Topics Concern  . Not on file  Social History Narrative  . Not on file     Family History  Problem Relation Age of Onset  . Diabetes Mother        AODM  . Stroke Mother   . Hypertension Mother   . Heart disease Father   . Diabetes Sister        AODM  . Breast cancer Sister   . Hypertension Sister   . Breast cancer Daughter 3       dec--mets to liver/spine     Review of Systems: General: negative for chills, fever, night sweats or weight changes.  Cardiovascular: negative for dyspnea on exertion, edema, orthopnea, palpitations, paroxysmal nocturnal dyspnea or shortness of breath Dermatological: negative for rash Respiratory: negative for cough or wheezing Urologic: negative for hematuria Abdominal: negative for nausea, vomiting, diarrhea, bright red blood per rectum, melena, or hematemesis Neurologic: negative for visual changes, syncope, or dizziness All other systems reviewed and are otherwise negative except as noted above.    Blood pressure 112/72, pulse 84, height 4\' 11"  (1.499 m), weight 125 lb 8  oz (56.9 kg), last menstrual period 12/21/1999.  General appearance: alert, cooperative and no distress Neck: no carotid bruit and no JVD Lungs: clear to auscultation bilaterally Heart: regular rate and rhythm Abdomen: soft, non-tender; bowel sounds normal; no masses,  no organomegaly Extremities: no edema Pulses: good radial pulses Skin: warm and dry Neurologic: Grossly normal  EKG NSR, RBBB  ASSESSMENT AND PLAN:   Unstable angina (HCC) Pt presents today to the office as an add on with a history of 48 hours of intermittent nitrate responsive chest pain.  CAD (coronary artery disease) 09/2014 NSTEMI s/p LHC with sig 2V dz. dLAD diffusely diseased and not suitable for PCI. unsuccessful RCA PCI d/t heavy calcifications  Essential hypertension Controlled  Moderate persistent asthma in adult without complication Followed by Dr Melvyn Novas  Hyperlipidemia with target LDL less than 70 LDL 63 March 2019 on high dose statin Rx   PLAN  Pt seen by Dr Gwenlyn Found and myself in the office today. Will admit via EMS for medical Rx- IV NTG and Heparin-and consideration of cath.   Kerin Ransom PA-C 09/26/2018 1:44 PM

## 2018-09-27 ENCOUNTER — Ambulatory Visit (HOSPITAL_COMMUNITY): Admission: RE | Admit: 2018-09-27 | Payer: Medicare Other | Source: Ambulatory Visit | Admitting: Cardiovascular Disease

## 2018-09-27 ENCOUNTER — Observation Stay (HOSPITAL_BASED_OUTPATIENT_CLINIC_OR_DEPARTMENT_OTHER): Payer: Medicare Other

## 2018-09-27 ENCOUNTER — Encounter (HOSPITAL_COMMUNITY): Payer: Self-pay | Admitting: Cardiovascular Disease

## 2018-09-27 ENCOUNTER — Encounter (HOSPITAL_COMMUNITY)
Admission: EM | Disposition: A | Discharge status: Home Health | Payer: Self-pay | Source: Home / Self Care | Attending: Cardiology

## 2018-09-27 DIAGNOSIS — I5030 Unspecified diastolic (congestive) heart failure: Secondary | ICD-10-CM | POA: Diagnosis not present

## 2018-09-27 DIAGNOSIS — I25110 Atherosclerotic heart disease of native coronary artery with unstable angina pectoris: Principal | ICD-10-CM

## 2018-09-27 HISTORY — PX: LEFT HEART CATH AND CORONARY ANGIOGRAPHY: CATH118249

## 2018-09-27 LAB — TROPONIN I: Troponin I: <0.03 ng/mL (ref <0.03)

## 2018-09-27 LAB — CBC
HEMATOCRIT: 32 % — AB (ref 36.0–46.0)
HEMOGLOBIN: 10.1 g/dL — AB (ref 12.0–15.0)
MCH: 26.8 pg (ref 26.0–34.0)
MCHC: 31.6 g/dL (ref 30.0–36.0)
MCV: 84.9 fL (ref 80.0–100.0)
NRBC: 0 % (ref 0.0–0.2)
Platelets: 518 10*3/uL — ABNORMAL HIGH (ref 150–400)
RBC: 3.77 MIL/uL — ABNORMAL LOW (ref 3.87–5.11)
RDW: 17.5 % — AB (ref 11.5–15.5)
WBC: 9.4 10*3/uL (ref 4.0–10.5)

## 2018-09-27 LAB — HEPARIN LEVEL (UNFRACTIONATED)
Heparin Unfractionated: 0.12 IU/mL — ABNORMAL LOW (ref 0.30–0.70)
Heparin Unfractionated: 0.16 IU/mL — ABNORMAL LOW (ref 0.30–0.70)

## 2018-09-27 LAB — POCT ACTIVATED CLOTTING TIME: Activated Clotting Time: 114 seconds

## 2018-09-27 LAB — ECHOCARDIOGRAM COMPLETE
HEIGHTINCHES: 59 in
Weight: 2009.6 oz

## 2018-09-27 SURGERY — LEFT HEART CATH AND CORONARY ANGIOGRAPHY
Anesthesia: LOCAL

## 2018-09-27 MED ORDER — MIDAZOLAM HCL 2 MG/2ML IJ SOLN
INTRAMUSCULAR | Status: DC | PRN
  Administered 2018-09-27: 1 mg via INTRAVENOUS

## 2018-09-27 MED ORDER — SODIUM CHLORIDE 0.9% FLUSH: 3.0000 mL | INTRAVENOUS | Status: DC | PRN

## 2018-09-27 MED ORDER — ATORVASTATIN CALCIUM 80 MG PO TABS
80.0000 mg | ORAL_TABLET | Freq: Every day | ORAL | Status: DC
  Administered 2018-09-27 – 2018-10-08 (×12): 80 mg via ORAL
  Filled 2018-09-27 (×13): qty 1

## 2018-09-27 MED ORDER — ATORVASTATIN CALCIUM 80 MG PO TABS: 80.0000 mg | ORAL_TABLET | Freq: Every day | ORAL | Status: DC

## 2018-09-27 MED ORDER — PANTOPRAZOLE SODIUM 40 MG PO TBEC
40.0000 mg | DELAYED_RELEASE_TABLET | Freq: Every day | ORAL | Status: DC
  Administered 2018-09-27 – 2018-10-03 (×7): 40 mg via ORAL
  Filled 2018-09-27 (×7): qty 1

## 2018-09-27 MED ORDER — MOMETASONE FURO-FORMOTEROL FUM 200-5 MCG/ACT IN AERO
2.0000 | INHALATION_SPRAY | Freq: Two times a day (BID) | RESPIRATORY_TRACT | Status: DC
  Administered 2018-09-27 – 2018-10-09 (×21): 2 via RESPIRATORY_TRACT
  Filled 2018-09-27: qty 8.8

## 2018-09-27 MED ORDER — VERAPAMIL HCL 2.5 MG/ML IV SOLN
INTRAVENOUS | Status: AC
  Filled 2018-09-27: qty 2

## 2018-09-27 MED ORDER — SODIUM CHLORIDE 0.9% FLUSH
3.0000 mL | Freq: Two times a day (BID) | INTRAVENOUS | Status: DC
  Administered 2018-09-27 – 2018-10-03 (×3): 3 mL via INTRAVENOUS

## 2018-09-27 MED ORDER — RANOLAZINE ER 500 MG PO TB12
500.0000 mg | ORAL_TABLET | Freq: Two times a day (BID) | ORAL | Status: DC
  Administered 2018-09-27: 500 mg via ORAL
  Filled 2018-09-27: qty 1

## 2018-09-27 MED ORDER — HEPARIN SODIUM (PORCINE) 1000 UNIT/ML IJ SOLN
INTRAMUSCULAR | Status: AC
  Filled 2018-09-27: qty 1

## 2018-09-27 MED ORDER — ACETAMINOPHEN 325 MG PO TABS
650.0000 mg | ORAL_TABLET | ORAL | Status: DC | PRN
  Administered 2018-09-28 – 2018-10-01 (×2): 650 mg via ORAL
  Filled 2018-09-27 (×2): qty 2

## 2018-09-27 MED ORDER — HEPARIN (PORCINE) IN NACL 1000-0.9 UT/500ML-% IV SOLN
INTRAVENOUS | Status: AC
  Filled 2018-09-27: qty 500

## 2018-09-27 MED ORDER — FENTANYL CITRATE (PF) 100 MCG/2ML IJ SOLN
INTRAMUSCULAR | Status: AC
  Filled 2018-09-27: qty 2

## 2018-09-27 MED ORDER — HEPARIN (PORCINE) IN NACL 1000-0.9 UT/500ML-% IV SOLN
INTRAVENOUS | Status: DC | PRN
  Administered 2018-09-27 (×2): 500 mL

## 2018-09-27 MED ORDER — IOHEXOL 350 MG/ML SOLN
INTRAVENOUS | Status: DC | PRN
  Administered 2018-09-27: 90 mL

## 2018-09-27 MED ORDER — BISOPROLOL FUMARATE 5 MG PO TABS
5.0000 mg | ORAL_TABLET | Freq: Every day | ORAL | Status: DC
  Administered 2018-09-27 – 2018-10-03 (×7): 5 mg via ORAL
  Filled 2018-09-27 (×7): qty 1

## 2018-09-27 MED ORDER — SODIUM CHLORIDE 0.9 % IV SOLN: INTRAVENOUS | Status: AC

## 2018-09-27 MED ORDER — SODIUM CHLORIDE 0.9 % IV SOLN
250.0000 mL | INTRAVENOUS | Status: DC | PRN
  Administered 2018-09-27: 250 mL via INTRAVENOUS

## 2018-09-27 MED ORDER — MIDAZOLAM HCL 2 MG/2ML IJ SOLN
INTRAMUSCULAR | Status: AC
  Filled 2018-09-27: qty 2

## 2018-09-27 MED ORDER — LIDOCAINE HCL (PF) 1 % IJ SOLN
INTRAMUSCULAR | Status: AC
  Filled 2018-09-27: qty 30

## 2018-09-27 MED ORDER — LIDOCAINE HCL (PF) 1 % IJ SOLN
INTRAMUSCULAR | Status: DC | PRN
  Administered 2018-09-27: 2 mL

## 2018-09-27 MED ORDER — FENTANYL CITRATE (PF) 100 MCG/2ML IJ SOLN
INTRAMUSCULAR | Status: DC | PRN
  Administered 2018-09-27: 25 ug via INTRAVENOUS

## 2018-09-27 MED ORDER — ONDANSETRON HCL 4 MG/2ML IJ SOLN
4.0000 mg | Freq: Four times a day (QID) | INTRAMUSCULAR | Status: DC | PRN
  Administered 2018-09-27 – 2018-10-03 (×4): 4 mg via INTRAVENOUS
  Filled 2018-09-27 (×4): qty 2

## 2018-09-27 MED ORDER — RANOLAZINE ER 500 MG PO TB12
1000.0000 mg | ORAL_TABLET | Freq: Two times a day (BID) | ORAL | Status: DC
  Administered 2018-09-27 – 2018-10-03 (×13): 1000 mg via ORAL
  Filled 2018-09-27 (×13): qty 2

## 2018-09-27 MED ORDER — EZETIMIBE 10 MG PO TABS
10.0000 mg | ORAL_TABLET | Freq: Every day | ORAL | Status: DC
  Administered 2018-09-27 – 2018-10-09 (×12): 10 mg via ORAL
  Filled 2018-09-27 (×12): qty 1

## 2018-09-27 MED ORDER — FAMOTIDINE 20 MG PO TABS
20.0000 mg | ORAL_TABLET | Freq: Every day | ORAL | Status: DC
  Administered 2018-09-27 – 2018-10-03 (×7): 20 mg via ORAL
  Filled 2018-09-27 (×7): qty 1

## 2018-09-27 SURGICAL SUPPLY — 8 items
CATH INFINITI 5FR MULTPACK ANG (CATHETERS) ×2 IMPLANT
KIT HEART LEFT (KITS) ×2 IMPLANT
PACK CARDIAC CATHETERIZATION (CUSTOM PROCEDURE TRAY) ×2 IMPLANT
SHEATH PINNACLE 5F 10CM (SHEATH) ×2 IMPLANT
SYR MEDRAD MARK V 150ML (SYRINGE) ×2 IMPLANT
TRANSDUCER W/STOPCOCK (MISCELLANEOUS) ×2 IMPLANT
TUBING CIL FLEX 10 FLL-RA (TUBING) ×2 IMPLANT
WIRE EMERALD 3MM-J .035X150CM (WIRE) ×2 IMPLANT

## 2018-09-27 NOTE — Progress Notes (Signed)
[place] for Heparin Indication: chest pain/ACS  Allergies  Allergen Reactions  . Fish-Derived Products Shortness Of Breath, Swelling and Rash  . Penicillins Shortness Of Breath, Swelling and Rash    PATIENT HAS HAD A PCN REACTION WITH IMMEDIATE RASH, FACIAL/TONGUE/THROAT SWELLING, SOB, OR LIGHTHEADEDNESS WITH HYPOTENSION:  #  #  #  YES  #  #  #   Has patient had a PCN reaction causing severe rash involving mucus membranes or skin necrosis: No PATIENT HAS HAD A PCN REACTION THAT REQUIRED HOSPITALIZATION:  #  #  #  YES  #  #  #   Has patient had a PCN reaction occurring within the last 10 years: No    . Aspirin Swelling and Rash    SWELLING REACTION UNSPECIFIED   . Brilinta [Ticagrelor] Rash    Started Brilinta 09/2014 - rash - unsure which it is directly related to  . Codeine Phosphate Nausea Only  . Diphenhydramine Hcl Rash    Patient Measurements: Height: 4\' 11"  (149.9 cm) Weight: 124 lb 9.6 oz (56.5 kg) IBW/kg (Calculated) : 43.2 Heparin Dosing Weight: 54.8 kg  Vital Signs: Temp: 97.9 F (36.6 C) (10/08 2335) Temp Source: Oral (10/08 2335) BP: 126/71 (10/08 2335) Pulse Rate: 81 (10/08 2335)  Labs: Recent Labs    09/26/18 1456 09/26/18 1457 09/26/18 2059 09/27/18 0001  HGB  --  10.8*  --   --   HCT  --  34.9*  --   --   PLT  --  591*  --   --   LABPROT 13.7  --   --   --   INR 1.06  --   --   --   HEPARINUNFRC  --   --   --  0.12*  CREATININE 0.83  --   --   --   TROPONINI <0.03  --  <0.03  --     Estimated Creatinine Clearance: 44.1 mL/min (by C-G formula based on SCr of 0.83 mg/dL).   Medical History: Past Medical History:  Diagnosis Date  . Allergic rhinitis   . Arthritis   . Asthma    xolair s 8/05 ?11/07; mastered hfa 12/20/08  . Benign positional vertigo   . CAD (coronary artery disease)    a. 09/2014 NSTEMI s/p LHC with sig 2V dz. dLAD diffusely diseased and not suitable for PCI. unsuccessful RCA PCI d/t  heavy calcifications  . Depression   . Dyspnea   . GERD (gastroesophageal reflux disease)   . Heart attack ([place])    09/2014  . Hyperlipidemia    <130 ldl pos fm hx, bp  . Hypertension   . Obesity   . Osteopenia    dexa 08/22/07 AP spine + 1.1, left femur -1.3, right femur -.8; dexa 10/06/09 +1.6, left femur =1.6, right femur -.  . PONV (postoperative nausea and vomiting)   . Ruptured disc, cervical   . 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 Pharmacy consulted to start Heparin for ACS.   The patient was not on anticoagulation PTA. No recent surgeries or CVA w/in 3 months noted. Hgb 10.8, plts 591.    10/9 AM update: initial heparin level is low at 0.12, plan for cath   Goal of Therapy:  Heparin level 0.3-0.7 units/ml Monitor platelets by  anticoagulation protocol: Yes   Plan:  Inc heparin to 800 units/hr 1000 heparin level Likely cath today  Narda Bonds, PharmD, [place] Pharmacist Phone: [phone]

## 2018-09-27 NOTE — Progress Notes (Signed)
Progress Note  Patient Name: Cassie Campbell Date of Encounter: 09/27/2018  Primary Cardiologist: Shelva Majestic, MD   Subjective   Symptoms in the office yesterday were pretty concerning. SSCP with radiation to the neck and dyspnea. Nitrate responsive. She is feeling much better today. Currently CP free and denies any symptoms overnight. Awaiting cath today.   Inpatient Medications    Scheduled Meds: . clopidogrel  75 mg Oral Q breakfast   Continuous Infusions: . sodium chloride 1 mL/kg/hr (09/26/18 2026)  . heparin 800 Units/hr (09/27/18 0156)   PRN Meds: acetaminophen, ALPRAZolam, nitroGLYCERIN, ondansetron (ZOFRAN) IV, sodium chloride flush, zolpidem   Vital Signs    Vitals:   09/26/18 2035 09/26/18 2335 09/27/18 0430 09/27/18 0751  BP: 122/68 126/71 116/77 120/78  Pulse: 81 81 74 68  Resp: 16   14  Temp: (!) 97.4 F (36.3 C) 97.9 F (36.6 C) 97.8 F (36.6 C) 98.3 F (36.8 C)  TempSrc: Oral Oral Oral Oral  SpO2:  95% 97% 96%  Weight:   57 kg   Height:        Intake/Output Summary (Last 24 hours) at 09/27/2018 0929 Last data filed at 09/27/2018 0530 Gross per 24 hour  Intake 649.1 ml  Output 750 ml  Net -100.9 ml   Filed Weights   09/26/18 1444 09/26/18 1841 09/27/18 0430  Weight: 56.7 kg 56.5 kg 57 kg    Telemetry    SR, RBBB - Personally Reviewed  ECG    SR RBBB 86 bpm - Personally Reviewed  Physical Exam   GEN: elderly BF in No acute distress.   Neck: No JVD Cardiac: RRR, no murmurs, rubs, or gallops.  Respiratory: expiratory rhonchi bilaterally but otherwise with inspiration  GI: Soft, nontender, non-distended  MS: No edema; No deformity. Neuro:  Nonfocal  Psych: Normal affect   Labs    Chemistry Recent Labs  Lab 09/26/18 1456  NA 136  K 4.0  CL 101  CO2 25  GLUCOSE 113*  BUN 11  CREATININE 0.83  CALCIUM 9.7  PROT 9.0*  ALBUMIN 3.4*  AST 32  ALT 20  ALKPHOS 93  BILITOT 0.6  GFRNONAA >60  GFRAA >60  ANIONGAP 10      Hematology Recent Labs  Lab 09/26/18 1457 09/27/18 0349  WBC 10.3 9.4  RBC 4.04 3.77*  HGB 10.8* 10.1*  HCT 34.9* 32.0*  MCV 86.4 84.9  MCH 26.7 26.8  MCHC 30.9 31.6  RDW 17.2* 17.5*  PLT 591* 518*    Cardiac Enzymes Recent Labs  Lab 09/26/18 1456 09/26/18 2059 09/27/18 0349  TROPONINI <0.03 <0.03 <0.03    Recent Labs  Lab 09/26/18 1507  TROPIPOC 0.00     BNPNo results for input(s): BNP, PROBNP in the last 168 hours.   DDimer No results for input(s): DDIMER in the last 168 hours.   Radiology    Dg Chest 2 View  Result Date: 09/26/2018 CLINICAL DATA:  Chest pain EXAM: CHEST - 2 VIEW COMPARISON:  11/29/2017 FINDINGS: Heart size and vascularity normal. Chronic right middle lobe atelectasis unchanged from prior studies. No acute infiltrate or effusion. Mild bibasilar scarring unchanged. IMPRESSION: No acute abnormality. Bibasilar scarring and right middle lobe chronic atelectasis. Electronically Signed   By: Franchot Gallo M.D.   On: 09/26/2018 16:04    Cardiac Studies   Diagnostic LHC 10/16/14 Coronary angiography: Coronary dominance: right    Left Main:  mildly calcified with no significant obstructive disease.  Left Anterior Descending (LAD):  Heavily calcified throughout its course. There is diffuse 20% disease proximally and 40-50% disease in the midsegment. There is 70% stenosis in multiple areas distally all the way close to the apex  1st diagonal (D1):  Small in size with minor irregularities.  2nd diagonal (D2):  Small in size with minor irregularities.  3rd diagonal (D3):  Small in size with minor irregularities.  Circumflex (LCx):  Normal in size and nondominant. The vessel is moderately calcified with 20-30% stenosis in the midsegment.  1st obtuse marginal:  Small in size with 95% proximal stenosis.  2nd obtuse marginal:  Large in size with minor irregularities.  3rd obtuse marginal:  Normal in size with minor irregularities.             AV groove continuation segment: Medium in size with minor irregularities.  Right Coronary Artery: Normal in size and dominant. The vessel is heavily calcified especially in the mid and distal segment. There is 20% proximal stenosis. There is 95% heavily calcified stenosis in the midsegment. There is 99% discrete stenosis in the mid to distal segment with TIMI 1 flow throughout the vessel. The distal vessel into the PDA is underfilling and could not be evaluated although there might be some ostial PDA disease.  PCI Data: Vessel - RCA/Segment - mid Percent Stenosis (pre)  95% TIMI-flow 1  balloon angioplasty only (not successful) Percent Stenosis (post) 90 TIMI-flow (post) 3   Final Conclusions:  1. Significant 2 vessel coronary artery disease. The distal LAD is diffusely diseased and not suitable for revascularization. The culprit for non-ST elevation myocardial infarction is likely the right coronary artery which is heavily calcified. 2. Normal LV systolic function by echo. Mildly elevated left ventricular end-diastolic pressure. 3. Unsuccessful RCA PCI due to inability to dilate the lesion as the result of heavy calcifications.   Failed RCA PCI Attempt 10/17/14  IMPRESSION:  Extremely difficult percutaneous coronary intervention with initial plans for high-speed rotational atherectomy to the RCA which ultimately was never able to be performed, and  aborted PTCA attempt of the extremely calcified subtotal RCA at the acute margina and multiple tandem distal stenoses    Patient Profile     77 y.o. female w/ a history of complex CAD, s/p unsuccessful attempt at PCI Oct 2015 (medically managed), ASA allergy, HTN, chronic RBBB and asthma, directly admitted from our office 09/26/18 for active CP c/w unstable angina.   Assessment & Plan    1. CAD w/ Unstable Angina: h/o NSTEMI in 2015 secondary to occluded RCA, but failed PCI attempt. Also found to have mild-moderate diffuse LAD  disease at time of cath. Medical management. Now with unstable angina. Currently CP free with IV heparin. Troponins negative x 3, ruling out MI. EKG shows SR and chronic RBBB. Plan for repeat LHC today to reassess coronaries. BP and HR well controlled. Continue IV heparin until cath and Plavix. No ASA due to allergy. I will reorder home BB and home statin (most recent lipid panel showed controlled LDL at 63 mg/dL).    For questions or updates, please contact [place] Please consult [email or web address] for contact info under        Signed, Lyda Jester, PA-C  09/27/2018, 9:29 AM

## 2018-09-27 NOTE — Progress Notes (Signed)
[facility] for Heparin Indication: chest pain/ACS  Allergies  Allergen Reactions  . Fish-Derived Products Shortness Of Breath, Swelling and Rash  . Penicillins Shortness Of Breath, Swelling and Rash    PATIENT HAS HAD A PCN REACTION WITH IMMEDIATE RASH, FACIAL/TONGUE/THROAT SWELLING, SOB, OR LIGHTHEADEDNESS WITH HYPOTENSION:  #  #  #  YES  #  #  #   Has patient had a PCN reaction causing severe rash involving mucus membranes or skin necrosis: No PATIENT HAS HAD A PCN REACTION THAT REQUIRED HOSPITALIZATION:  #  #  #  YES  #  #  #   Has patient had a PCN reaction occurring within the last 10 years: No    . Aspirin Swelling and Rash    SWELLING REACTION UNSPECIFIED   . Brilinta [Ticagrelor] Rash    Started Brilinta 09/2014 - rash - unsure which it is directly related to  . Codeine Phosphate Nausea Only  . Diphenhydramine Hcl Rash    Patient Measurements: Height: 4\' 11"  (149.9 cm) Weight: 125 lb 9.6 oz (57 kg) IBW/kg (Calculated) : 43.2 Heparin Dosing Weight: 54.8 kg  Vital Signs: Temp: 98.3 F (36.8 C) (10/09 0751) Temp Source: Oral (10/09 0751) BP: 138/64 (10/09 1550) Pulse Rate: 58 (10/09 1550)  Labs: Recent Labs    09/26/18 1456 09/26/18 1457 09/26/18 2059 09/27/18 0001 09/27/18 0349 09/27/18 1026  HGB  --  10.8*  --   --  10.1*  --   HCT  --  34.9*  --   --  32.0*  --   PLT  --  591*  --   --  518*  --   LABPROT 13.7  --   --   --   --   --   INR 1.06  --   --   --   --   --   HEPARINUNFRC  --   --   --  0.12*  --  0.16*  CREATININE 0.83  --   --   --   --   --   TROPONINI <0.03  --  <0.03  --  <0.03  --     Estimated Creatinine Clearance: 44.3 mL/min (by C-G formula based on SCr of 0.83 mg/dL).   Medical History: Past Medical History:  Diagnosis Date  . Allergic rhinitis   . Arthritis   . Asthma    xolair s 8/05 ?11/07; mastered hfa 12/20/08  . Benign positional vertigo   . CAD (coronary artery disease)    a.  09/2014 NSTEMI s/p LHC with sig 2V dz. dLAD diffusely diseased and not suitable for PCI. unsuccessful RCA PCI d/t heavy calcifications  . Depression   . Dyspnea   . GERD (gastroesophageal reflux disease)   . Heart attack ([place])    09/2014  . Hyperlipidemia    <130 ldl pos fm hx, bp  . Hypertension   . Obesity   . Osteopenia    dexa 08/22/07 AP spine + 1.1, left femur -1.3, right femur -.8; dexa 10/06/09 +1.6, left femur =1.6, right femur -.  . PONV (postoperative nausea and vomiting)   . Ruptured disc, cervical   . 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 Pharmacy consulted to start Heparin for ACS.   The patient was not on anticoagulation PTA.  No recent surgeries or CVA w/in 3 months noted. CBC stable.  Heparin level was low at 0.16 prior to cath. New orders to resume heparin post cath and CVTS will consulted for possible surgical intervention of severe multivessel CAD.   Goal of Therapy:  Heparin level 0.3-0.7 units/ml Monitor platelets by anticoagulation protocol: Yes   Plan:  Restart heparin at 950 units/hr 8h heparin level Monitor daily heparin level and CBC, s/sx bleeding Follow up surgical plans  Erin Hearing PharmD., BCPS Clinical Pharmacist 09/27/2018 4:30 PM

## 2018-09-27 NOTE — Progress Notes (Signed)
[facility] for Heparin Indication: chest pain/ACS  Allergies  Allergen Reactions  . Fish-Derived Products Shortness Of Breath, Swelling and Rash  . Penicillins Shortness Of Breath, Swelling and Rash    PATIENT HAS HAD A PCN REACTION WITH IMMEDIATE RASH, FACIAL/TONGUE/THROAT SWELLING, SOB, OR LIGHTHEADEDNESS WITH HYPOTENSION:  #  #  #  YES  #  #  #   Has patient had a PCN reaction causing severe rash involving mucus membranes or skin necrosis: No PATIENT HAS HAD A PCN REACTION THAT REQUIRED HOSPITALIZATION:  #  #  #  YES  #  #  #   Has patient had a PCN reaction occurring within the last 10 years: No    . Aspirin Swelling and Rash    SWELLING REACTION UNSPECIFIED   . Brilinta [Ticagrelor] Rash    Started Brilinta 09/2014 - rash - unsure which it is directly related to  . Codeine Phosphate Nausea Only  . Diphenhydramine Hcl Rash    Patient Measurements: Height: 4\' 11"  (149.9 cm) Weight: 125 lb 9.6 oz (57 kg) IBW/kg (Calculated) : 43.2 Heparin Dosing Weight: 54.8 kg  Vital Signs: Temp: 98.3 F (36.8 C) (10/09 0751) Temp Source: Oral (10/09 0751) BP: 120/78 (10/09 0751) Pulse Rate: 68 (10/09 0751)  Labs: Recent Labs    09/26/18 1456 09/26/18 1457 09/26/18 2059 09/27/18 0001 09/27/18 0349 09/27/18 1026  HGB  --  10.8*  --   --  10.1*  --   HCT  --  34.9*  --   --  32.0*  --   PLT  --  591*  --   --  518*  --   LABPROT 13.7  --   --   --   --   --   INR 1.06  --   --   --   --   --   HEPARINUNFRC  --   --   --  0.12*  --  0.16*  CREATININE 0.83  --   --   --   --   --   TROPONINI <0.03  --  <0.03  --  <0.03  --     Estimated Creatinine Clearance: 44.3 mL/min (by C-G formula based on SCr of 0.83 mg/dL).   Medical History: Past Medical History:  Diagnosis Date  . Allergic rhinitis   . Arthritis   . Asthma    xolair s 8/05 ?11/07; mastered hfa 12/20/08  . Benign positional vertigo   . CAD (coronary artery disease)    a.  09/2014 NSTEMI s/p LHC with sig 2V dz. dLAD diffusely diseased and not suitable for PCI. unsuccessful RCA PCI d/t heavy calcifications  . Depression   . Dyspnea   . GERD (gastroesophageal reflux disease)   . Heart attack ([place])    09/2014  . Hyperlipidemia    <130 ldl pos fm hx, bp  . Hypertension   . Obesity   . Osteopenia    dexa 08/22/07 AP spine + 1.1, left femur -1.3, right femur -.8; dexa 10/06/09 +1.6, left femur =1.6, right femur -.  . PONV (postoperative nausea and vomiting)   . Ruptured disc, cervical   . Spondylolisthesis at L4-L5 level    With Neurogenic Claudication    Assessment: 4 YOF with history of CAD s/p unsuccessful attempt at PCI in Oct '15 managed medically who presented on 10/8 with CP. Pharmacy consulted to start Heparin for ACS.   The patient was not on anticoagulation PTA.  No recent surgeries or CVA w/in 3 months noted. CBC stable.   Heparin level is low at 0.16 after rate increase this AM, plan for cath later today. Per RN, no issues with infusion or bleeding and drip has not yet been turned off for cath.\  Goal of Therapy:  Heparin level 0.3-0.7 units/ml Monitor platelets by anticoagulation protocol: Yes   Plan:  Increase heparin to 950 units/hr 8h heparin level vs. Cath Monitor daily heparin level and CBC, s/sx bleeding Cath scheduled for today  Elicia Lamp, PharmD, BCPS Clinical Pharmacist Clinical phone [phone] Please check AMION for all [place] contact numbers 09/27/2018 12:17 PM

## 2018-09-27 NOTE — Interval H&P Note (Signed)
Cath Lab Visit (complete for each Cath Lab visit)  Clinical Evaluation Leading to the Procedure:   ACS: No.  Non-ACS:    Anginal Classification: CCS IV  Anti-ischemic medical therapy: Maximal Therapy (2 or more classes of medications)  Non-Invasive Test Results: No non-invasive testing performed  Prior CABG: No previous CABG      History and Physical Interval Note:  09/27/2018 2:56 PM  Cassie Campbell  has presented today for surgery, with the diagnosis of cp  The various methods of treatment have been discussed with the patient and family. After consideration of risks, benefits and other options for treatment, the patient has consented to  Procedure(s): LEFT HEART CATH AND CORONARY ANGIOGRAPHY (N/A) as a surgical intervention .  The patient's history has been reviewed, patient examined, no change in status, stable for surgery.  I have reviewed the patient's chart and labs.  Questions were answered to the patient's satisfaction.     Shelva Majestic

## 2018-09-28 ENCOUNTER — Other Ambulatory Visit: Payer: Self-pay | Admitting: *Deleted

## 2018-09-28 ENCOUNTER — Other Ambulatory Visit: Payer: Self-pay

## 2018-09-28 ENCOUNTER — Encounter (HOSPITAL_COMMUNITY): Payer: Self-pay | Admitting: General Practice

## 2018-09-28 ENCOUNTER — Inpatient Hospital Stay (HOSPITAL_COMMUNITY): Payer: Medicare Other

## 2018-09-28 DIAGNOSIS — D62 Acute posthemorrhagic anemia: Secondary | ICD-10-CM | POA: Diagnosis not present

## 2018-09-28 DIAGNOSIS — M4316 Spondylolisthesis, lumbar region: Secondary | ICD-10-CM | POA: Diagnosis present

## 2018-09-28 DIAGNOSIS — I4519 Other right bundle-branch block: Secondary | ICD-10-CM | POA: Diagnosis present

## 2018-09-28 DIAGNOSIS — R072 Precordial pain: Secondary | ICD-10-CM | POA: Diagnosis present

## 2018-09-28 DIAGNOSIS — I4510 Unspecified right bundle-branch block: Secondary | ICD-10-CM | POA: Diagnosis not present

## 2018-09-28 DIAGNOSIS — Z01810 Encounter for preprocedural cardiovascular examination: Secondary | ICD-10-CM | POA: Diagnosis not present

## 2018-09-28 DIAGNOSIS — J309 Allergic rhinitis, unspecified: Secondary | ICD-10-CM | POA: Diagnosis present

## 2018-09-28 DIAGNOSIS — Z8249 Family history of ischemic heart disease and other diseases of the circulatory system: Secondary | ICD-10-CM | POA: Diagnosis not present

## 2018-09-28 DIAGNOSIS — F329 Major depressive disorder, single episode, unspecified: Secondary | ICD-10-CM | POA: Diagnosis present

## 2018-09-28 DIAGNOSIS — I2584 Coronary atherosclerosis due to calcified coronary lesion: Secondary | ICD-10-CM | POA: Diagnosis not present

## 2018-09-28 DIAGNOSIS — R7303 Prediabetes: Secondary | ICD-10-CM | POA: Diagnosis present

## 2018-09-28 DIAGNOSIS — J9811 Atelectasis: Secondary | ICD-10-CM | POA: Diagnosis not present

## 2018-09-28 DIAGNOSIS — M8580 Other specified disorders of bone density and structure, unspecified site: Secondary | ICD-10-CM | POA: Diagnosis present

## 2018-09-28 DIAGNOSIS — I252 Old myocardial infarction: Secondary | ICD-10-CM | POA: Diagnosis not present

## 2018-09-28 DIAGNOSIS — E8770 Fluid overload, unspecified: Secondary | ICD-10-CM | POA: Diagnosis not present

## 2018-09-28 DIAGNOSIS — Z7902 Long term (current) use of antithrombotics/antiplatelets: Secondary | ICD-10-CM | POA: Diagnosis not present

## 2018-09-28 DIAGNOSIS — R000 Tachycardia, unspecified: Secondary | ICD-10-CM | POA: Diagnosis not present

## 2018-09-28 DIAGNOSIS — Z9841 Cataract extraction status, right eye: Secondary | ICD-10-CM | POA: Diagnosis not present

## 2018-09-28 DIAGNOSIS — Z9842 Cataract extraction status, left eye: Secondary | ICD-10-CM | POA: Diagnosis not present

## 2018-09-28 DIAGNOSIS — I10 Essential (primary) hypertension: Secondary | ICD-10-CM | POA: Diagnosis present

## 2018-09-28 DIAGNOSIS — R110 Nausea: Secondary | ICD-10-CM | POA: Diagnosis not present

## 2018-09-28 DIAGNOSIS — I25110 Atherosclerotic heart disease of native coronary artery with unstable angina pectoris: Secondary | ICD-10-CM | POA: Diagnosis present

## 2018-09-28 DIAGNOSIS — E669 Obesity, unspecified: Secondary | ICD-10-CM | POA: Diagnosis present

## 2018-09-28 DIAGNOSIS — I2510 Atherosclerotic heart disease of native coronary artery without angina pectoris: Secondary | ICD-10-CM

## 2018-09-28 DIAGNOSIS — J4540 Moderate persistent asthma, uncomplicated: Secondary | ICD-10-CM | POA: Diagnosis present

## 2018-09-28 DIAGNOSIS — K219 Gastro-esophageal reflux disease without esophagitis: Secondary | ICD-10-CM | POA: Diagnosis present

## 2018-09-28 DIAGNOSIS — E785 Hyperlipidemia, unspecified: Secondary | ICD-10-CM | POA: Diagnosis present

## 2018-09-28 DIAGNOSIS — Z23 Encounter for immunization: Secondary | ICD-10-CM | POA: Diagnosis present

## 2018-09-28 DIAGNOSIS — I200 Unstable angina: Secondary | ICD-10-CM | POA: Diagnosis not present

## 2018-09-28 LAB — BASIC METABOLIC PANEL
Anion gap: 8 (ref 5–15)
BUN: 5 mg/dL — AB (ref 8–23)
CHLORIDE: 106 mmol/L (ref 98–111)
CO2: 24 mmol/L (ref 22–32)
CREATININE: 0.6 mg/dL (ref 0.44–1.00)
Calcium: 9 mg/dL (ref 8.9–10.3)
GFR calc Af Amer: >60 mL/min (ref >60)
GFR calc non Af Amer: >60 mL/min (ref >60)
GLUCOSE: 85 mg/dL (ref 70–99)
Potassium: 3.7 mmol/L (ref 3.5–5.1)
SODIUM: 138 mmol/L (ref 135–145)

## 2018-09-28 LAB — CBC
HCT: 33 % — ABNORMAL LOW (ref 36.0–46.0)
HEMOGLOBIN: 9.9 g/dL — AB (ref 12.0–15.0)
MCH: 26.6 pg (ref 26.0–34.0)
MCHC: 30 g/dL (ref 30.0–36.0)
MCV: 88.7 fL (ref 80.0–100.0)
Platelets: 467 10*3/uL — ABNORMAL HIGH (ref 150–400)
RBC: 3.72 MIL/uL — ABNORMAL LOW (ref 3.87–5.11)
RDW: 17.4 % — ABNORMAL HIGH (ref 11.5–15.5)
WBC: 8.4 10*3/uL (ref 4.0–10.5)
nRBC: 0 % (ref 0.0–0.2)

## 2018-09-28 LAB — HEPARIN LEVEL (UNFRACTIONATED)
HEPARIN UNFRACTIONATED: 0.35 [IU]/mL (ref 0.30–0.70)
Heparin Unfractionated: 0.25 IU/mL — ABNORMAL LOW (ref 0.30–0.70)

## 2018-09-28 MED ORDER — ENSURE ENLIVE PO LIQD: 237.0000 mL | Freq: Two times a day (BID) | ORAL | Status: DC

## 2018-09-28 NOTE — Progress Notes (Signed)
[facility] for Heparin Indication: chest pain/ACS  Allergies  Allergen Reactions  . Fish-Derived Products Shortness Of Breath, Swelling and Rash  . Penicillins Shortness Of Breath, Swelling and Rash    PATIENT HAS HAD A PCN REACTION WITH IMMEDIATE RASH, FACIAL/TONGUE/THROAT SWELLING, SOB, OR LIGHTHEADEDNESS WITH HYPOTENSION:  #  #  #  YES  #  #  #   Has patient had a PCN reaction causing severe rash involving mucus membranes or skin necrosis: No PATIENT HAS HAD A PCN REACTION THAT REQUIRED HOSPITALIZATION:  #  #  #  YES  #  #  #   Has patient had a PCN reaction occurring within the last 10 years: No    . Aspirin Swelling and Rash    SWELLING REACTION UNSPECIFIED   . Brilinta [Ticagrelor] Rash    Started Brilinta 09/2014 - rash - unsure which it is directly related to  . Codeine Phosphate Nausea Only  . Diphenhydramine Hcl Rash    Patient Measurements: Height: 4\' 11"  (149.9 cm) Weight: 124 lb 12.8 oz (56.6 kg) IBW/kg (Calculated) : 43.2 Heparin Dosing Weight: 54.8 kg  Vital Signs: Temp: 98.2 F (36.8 C) (10/10 0535) Temp Source: Oral (10/10 0535) BP: 124/74 (10/10 0535) Pulse Rate: 76 (10/10 0535)  Labs: Recent Labs    09/26/18 1456  09/26/18 1457 09/26/18 2059 09/27/18 0001 09/27/18 0349 09/27/18 1026 09/28/18 0413  HGB  --    < > 10.8*  --   --  10.1*  --  9.9*  HCT  --   --  34.9*  --   --  32.0*  --  33.0*  PLT  --   --  591*  --   --  518*  --  467*  LABPROT 13.7  --   --   --   --   --   --   --   INR 1.06  --   --   --   --   --   --   --   HEPARINUNFRC  --   --   --   --  0.12*  --  0.16* 0.25*  CREATININE 0.83  --   --   --   --   --   --  0.60  TROPONINI <0.03  --   --  <0.03  --  <0.03  --   --    < > = values in this interval not displayed.    Estimated Creatinine Clearance: 45.9 mL/min (by C-G formula based on SCr of 0.6 mg/dL).   Medical History: Past Medical History:  Diagnosis Date  . Allergic rhinitis    . Arthritis   . Asthma    xolair s 8/05 ?11/07; mastered hfa 12/20/08  . Benign positional vertigo   . CAD (coronary artery disease)    a. 09/2014 NSTEMI s/p LHC with sig 2V dz. dLAD diffusely diseased and not suitable for PCI. unsuccessful RCA PCI d/t heavy calcifications  . Depression   . Dyspnea   . GERD (gastroesophageal reflux disease)   . Heart attack ([place])    09/2014  . Hyperlipidemia    <130 ldl pos fm hx, bp  . Hypertension   . Obesity   . Osteopenia    dexa 08/22/07 AP spine + 1.1, left femur -1.3, right femur -.8; dexa 10/06/09 +1.6, left femur =1.6, right femur -.  . PONV (postoperative nausea and vomiting)   . Ruptured disc, cervical   .  Spondylolisthesis at L4-L5 level    With Neurogenic Claudication    Assessment: 75 YOF with history of CAD s/p unsuccessful attempt at PCI in Oct '15 managed medically who presented on 10/8 with CP. Pharmacy consulted to start Heparin for ACS.   The patient was not on anticoagulation PTA. No recent surgeries or CVA w/in 3 months noted. CBC stable.  10/9: Heparin level was low at 0.16 prior to cath. New orders to resume heparin post cath and CVTS will consulted for possible surgical intervention of severe multivessel CAD.   10/10 AM update: heparin level remains low after re-start s/p cath, no issues per RN  Goal of Therapy:  Heparin level 0.3-0.7 units/ml Monitor platelets by anticoagulation protocol: Yes   Plan:  Inc heparin to 1050 units/hr 1400 HL Monitor daily heparin level and CBC, s/sx bleeding Follow up [place], PharmD, [place] Pharmacist Phone: [phone]

## 2018-09-28 NOTE — Plan of Care (Signed)
  Problem: Education: Goal: Knowledge of General Education information will improve Description: Including pain rating scale, medication(s)/side effects and non-pharmacologic comfort measures Outcome: Progressing   Problem: Health Behavior/Discharge Planning: Goal: Ability to manage health-related needs will improve Outcome: Progressing   Problem: Clinical Measurements: Goal: Diagnostic test results will improve Outcome: Progressing   

## 2018-09-28 NOTE — Progress Notes (Signed)
[facility] for Heparin Indication: chest pain/ACS  Allergies  Allergen Reactions  . Fish-Derived Products Shortness Of Breath, Swelling and Rash  . Penicillins Shortness Of Breath, Swelling and Rash    PATIENT HAS HAD A PCN REACTION WITH IMMEDIATE RASH, FACIAL/TONGUE/THROAT SWELLING, SOB, OR LIGHTHEADEDNESS WITH HYPOTENSION:  #  #  #  YES  #  #  #   Has patient had a PCN reaction causing severe rash involving mucus membranes or skin necrosis: No PATIENT HAS HAD A PCN REACTION THAT REQUIRED HOSPITALIZATION:  #  #  #  YES  #  #  #   Has patient had a PCN reaction occurring within the last 10 years: No    . Aspirin Swelling and Rash    SWELLING REACTION UNSPECIFIED   . Brilinta [Ticagrelor] Rash    Started Brilinta 09/2014 - rash - unsure which it is directly related to  . Codeine Phosphate Nausea Only  . Diphenhydramine Hcl Rash    Patient Measurements: Height: 4\' 11"  (149.9 cm) Weight: 124 lb 12.8 oz (56.6 kg) IBW/kg (Calculated) : 43.2 Heparin Dosing Weight: 54.8 kg  Vital Signs: Temp: 98.1 F (36.7 C) (10/10 0750) Temp Source: Oral (10/10 0750) BP: 139/78 (10/10 0750) Pulse Rate: 70 (10/10 0750)  Labs: Recent Labs    09/26/18 1456  09/26/18 1457 09/26/18 2059  09/27/18 0349 09/27/18 1026 09/28/18 0413 09/28/18 1413  HGB  --    < > 10.8*  --   --  10.1*  --  9.9*  --   HCT  --   --  34.9*  --   --  32.0*  --  33.0*  --   PLT  --   --  591*  --   --  518*  --  467*  --   LABPROT 13.7  --   --   --   --   --   --   --   --   INR 1.06  --   --   --   --   --   --   --   --   HEPARINUNFRC  --   --   --   --    < >  --  0.16* 0.25* 0.35  CREATININE 0.83  --   --   --   --   --   --  0.60  --   TROPONINI <0.03  --   --  <0.03  --  <0.03  --   --   --    < > = values in this interval not displayed.    Estimated Creatinine Clearance: 45.9 mL/min (by C-G formula based on SCr of 0.6 mg/dL).   Medical History: Past Medical  History:  Diagnosis Date  . Allergic rhinitis   . Arthritis   . Asthma    xolair s 8/05 ?11/07; mastered hfa 12/20/08  . Benign positional vertigo   . CAD (coronary artery disease)    a. 09/2014 NSTEMI s/p LHC with sig 2V dz. dLAD diffusely diseased and not suitable for PCI. unsuccessful RCA PCI d/t heavy calcifications  . Depression   . Dyspnea   . GERD (gastroesophageal reflux disease)   . Heart attack ([place])    09/2014  . Hyperlipidemia    <130 ldl pos fm hx, bp  . Hypertension   . Obesity   . Osteopenia    dexa 08/22/07 AP spine + 1.1, left femur -1.3, right  femur -.8; dexa 10/06/09 +1.6, left femur =1.6, right femur -.  . PONV (postoperative nausea and vomiting)   . Ruptured disc, cervical   . Spondylolisthesis at L4-L5 level    With Neurogenic Claudication    Assessment: 15 YOF with history of CAD s/p unsuccessful attempt at PCI in Oct '15 managed medically who presented on 10/8 with CP. Pharmacy consulted to start Heparin for ACS.   The patient was not on anticoagulation PTA. No recent surgeries or CVA w/in 3 months noted. CBC stable.  Heparin resumed post-cath. CVTS will consulted for possible surgical intervention of severe multivessel CAD.   Heparin level therapeutic.   Goal of Therapy:  Heparin level 0.3-0.7 units/ml Monitor platelets by anticoagulation protocol: Yes   Plan:  Continue heparin at 1050 units/hr Monitor daily heparin level and CBC, s/sx bleeding Follow up [place], PharmD, BCPS Clinical Pharmacist Clinical phone [phone] Please check AMION for all [place] contact numbers 09/28/2018 3:17 PM

## 2018-09-28 NOTE — Consult Note (Signed)
Reason for Consult:2 vessel CAD Referring Physician: Dr. [name] is an 77 y.o. female.  HPI: 77 yo woman presented with a cc/o CP  Mrs. Cassie Campbell is a 77 yo woman with known CAD with an MI in 2015. Attempted PCI- unsuccessful, treated medically. Also has a history of hypertension, hyperlipidemia, GRED, obesity, depression, asthma, arthritis and DDD. She was in her usual state of health until _0 15  . CATARACT EXTRACTION Right 02/02/2018   Dr Satira Sark  . CATARACT  EXTRACTION Left 03/30/2018  . [place] SURGERY  12/01  . LEFT HEART CATH AND CORONARY ANGIOGRAPHY N/A 09/27/2018   Procedure: LEFT HEART CATH AND CORONARY ANGIOGRAPHY;  Surgeon: Troy Sine, MD;  Location: [facility];  Service: Cardiovascular;  Laterality: N/A;  . LEFT HEART CATHETERIZATION WITH CORONARY ANGIOGRAM N/A 10/16/2014   Procedure: LEFT HEART CATHETERIZATION WITH CORONARY ANGIOGRAM;  Surgeon: Blane Ohara, MD;  Location: [facility] CATH LAB;  Service: Cardiovascular;  Laterality: N/A;  . PERCUTANEOUS CORONARY ROTOBLATOR INTERVENTION (PCI-R) N/A 10/17/2014   Procedure: PERCUTANEOUS CORONARY ROTOBLATOR INTERVENTION (PCI-R);  Surgeon: Troy Sine, MD;  Location: [facility] CATH LAB;  Service: Cardiovascular;  Laterality: N/A;  . VEIN LIGATION AND STRIPPING    . VIDEO BRONCHOSCOPY Bilateral 02/05/2015   Procedure: VIDEO BRONCHOSCOPY WITHOUT FLUORO;  Surgeon: Tanda Rockers, MD;  Location: [facility];  Service: Endoscopy;  Laterality: Bilateral;  . VULVA /PERINEUM BIOPSY  12-30-10   --epidermoid cyst    Family History  Problem Relation Age of Onset  . Diabetes Mother        AODM  . Stroke Mother   . Hypertension Mother   . Heart disease Father   . Diabetes Sister        AODM  . Breast cancer Sister   . Hypertension Sister   . Breast cancer Daughter 11  dec--mets to liver/spine    Social History:  reports that she has never smoked. She has never used smokeless tobacco. She reports that she does not drink alcohol or use drugs.  Allergies:  Allergies  Allergen Reactions  . Fish-Derived Products Shortness Of Breath, Swelling and Rash  . Penicillins Shortness Of Breath, Swelling and Rash    PATIENT HAS HAD A PCN REACTION WITH IMMEDIATE RASH, FACIAL/TONGUE/THROAT SWELLING, SOB, OR LIGHTHEADEDNESS WITH HYPOTENSION:  #  #  #  YES  #  #  #   Has patient had a PCN reaction causing severe rash involving mucus membranes or skin necrosis: No PATIENT HAS HAD A PCN REACTION  THAT REQUIRED HOSPITALIZATION:  #  #  #  YES  #  #  #   Has patient had a PCN reaction occurring within the last 10 years: No    . Aspirin Swelling and Rash    SWELLING REACTION UNSPECIFIED   . Brilinta [Ticagrelor] Rash    Started Brilinta 09/2014 - rash - unsure which it is directly related to  . Codeine Phosphate Nausea Only  . Diphenhydramine Hcl Rash    Medications:  Scheduled: . atorvastatin  80 mg Oral q1800  . bisoprolol  5 mg Oral Daily  . ezetimibe  10 mg Oral Daily  . famotidine  20 mg Oral QHS  . feeding supplement (ENSURE ENLIVE)  237 mL Oral BID BM  . mometasone-formoterol  2 puff Inhalation BID  . pantoprazole  40 mg Oral Daily  . ranolazine  1,000 mg Oral BID  . sodium chloride flush  3 mL Intravenous Q12H    Results for orders placed or performed during the hospital encounter of 09/26/18 (from the past 48 hour(s))  MRSA PCR Screening     Status: None   Collection Time: 09/26/18  6:36 PM  Result Value Ref Range   MRSA by PCR NEGATIVE NEGATIVE    Comment:        The GeneXpert MRSA Assay (FDA approved for NASAL specimens only), is one component of a comprehensive MRSA colonization surveillance program. It is not intended to diagnose MRSA infection nor to guide or monitor treatment for MRSA infections. Performed at [facility], [place] [address] [place], [place] [address]   Troponin I     Status: None   Collection Time: 09/26/18  8:59 PM  Result Value Ref Range   Troponin I <0.03 <0.03 ng/mL    Comment: Performed at [facility] [address], [place], [place] [address]  Heparin level (unfractionated)     Status: Abnormal   Collection Time: 09/27/18 12:01 AM  Result Value Ref Range   Heparin Unfractionated 0.12 (L) 0.30 - 0.70 IU/mL    Comment: (NOTE) If heparin results are below expected values, and patient dosage has  been confirmed, suggest follow up testing of antithrombin III levels. Performed at [facility], [place] [address] [place], [place] [address]   Troponin I     Status: None   Collection Time: 09/27/18  3:49 AM  Result Value Ref Range   Troponin I <0.03 <0.03 ng/mL    Comment: Performed at [facility] [address], [place] [address]  CBC     Status: Abnormal   Collection Time: 09/27/18  3:49 AM  Result Value Ref Range   WBC 9.4 4.0 - 10.5 K/uL   RBC 3.77 (L) 3.87 - 5.11 MIL/uL   Hemoglobin 10.1 (L) 12.0 -  15.0 g/dL   HCT 32.0 (L) 36.0 - 46.0 %   MCV 84.9 80.0 - 100.0 fL   MCH 26.8 26.0 - 34.0 pg   MCHC 31.6 30.0 - 36.0 g/dL   RDW 17.5 (H) 11.5 - 15.5 %   Platelets 518 (H) 150 - 400 K/uL   nRBC 0.0 0.0 - 0.2 %    Comment: Performed at [facility] [address], [place], [place] [address]  Heparin level (unfractionated)     Status: Abnormal   Collection Time: 09/27/18 10:26 AM  Result Value Ref Range   Heparin Unfractionated 0.16 (L) 0.30 - 0.70 IU/mL    Comment: (NOTE) If heparin results are below expected values, and patient dosage has  been confirmed, suggest follow up testing of antithrombin III levels. Performed at [facility], [place] [address], [place], [place] [address]   POCT Activated clotting time     Status: None   Collection Time: 09/27/18  3:19 PM  Result Value Ref Range   Activated Clotting Time 114 seconds  CBC     Status: Abnormal   Collection Time: 09/28/18  4:13 AM  Result Value Ref Range   WBC 8.4 4.0 - 10.5 K/uL   RBC 3.72 (L) 3.87 - 5.11 MIL/uL   Hemoglobin 9.9 (L) 12.0 - 15.0 g/dL   HCT 33.0 (L) 36.0 - 46.0 %   MCV 88.7 80.0 - 100.0 fL   MCH 26.6 26.0 - 34.0 pg   MCHC 30.0 30.0 - 36.0 g/dL   RDW 17.4 (H) 11.5 - 15.5 %   Platelets 467 (H) 150 - 400 K/uL   nRBC 0.0 0.0 - 0.2 %    Comment: Performed at [facility], [place] [address], [place], [place] [address]  Basic metabolic panel     Status: Abnormal   Collection Time: 09/28/18  4:13 AM  Result Value Ref Range   Sodium 138 135 - 145 mmol/L   Potassium 3.7 3.5 - 5.1 mmol/L    Chloride 106 98 - 111 mmol/L   CO2 24 22 - 32 mmol/L   Glucose, Bld 85 70 - 99 mg/dL   BUN 5 (L) 8 - 23 mg/dL   Creatinine, Ser 0.60 0.44 - 1.00 mg/dL   Calcium 9.0 8.9 - 10.3 mg/dL   GFR calc non Af Amer >60 >60 mL/min   GFR calc Af Amer >60 >60 mL/min    Comment: (NOTE) The eGFR has been calculated using the CKD EPI equation. This calculation has not been validated in all clinical situations. eGFR's persistently <60 mL/min signify possible Chronic Kidney Disease.    Anion gap 8 5 - 15    Comment: Performed at [facility] [address], [place], [place] [address]  Heparin level (unfractionated)     Status: Abnormal   Collection Time: 09/28/18  4:13 AM  Result Value Ref Range   Heparin Unfractionated 0.25 (L) 0.30 - 0.70 IU/mL    Comment: (NOTE) If heparin results are below expected values, and patient dosage has  been confirmed, suggest follow up testing of antithrombin III levels. Performed at [facility], [place] [address], [place], [place] [address]   Heparin level (unfractionated)     Status: None   Collection Time: 09/28/18  2:13 PM  Result Value Ref Range   Heparin Unfractionated 0.35 0.30 - 0.70 IU/mL    Comment: (NOTE) If heparin results are below expected values, and patient dosage has  been confirmed,  suggest follow up testing of antithrombin III levels. Performed at [facility], [place] [address], [place], [place] [address]      Review of Systems  Constitutional: Positive for malaise/fatigue.  Eyes: Negative for blurred vision and double vision.  Respiratory: Positive for sputum production.   Cardiovascular: Positive for chest pain.  Genitourinary: Negative for dysuria.  Musculoskeletal: Positive for back pain, joint pain and neck pain.  Neurological: Negative for dizziness, speech change and focal weakness.   Blood pressure 139/78, pulse 70, temperature 98.1 F (36.7 C), temperature source Oral, resp. rate 18, height '4\' 11"'$  (1.499 m),  weight 56.6 kg, last menstrual period 12/21/1999, SpO2 96 %. Physical Exam  Vitals reviewed. Constitutional: She is oriented to person, place, and time. She appears well-developed and well-nourished. No distress.  HENT:  Head: Normocephalic and atraumatic.  Mouth/Throat: No oropharyngeal exudate.  Eyes: Conjunctivae and EOM are normal. No scleral icterus.  Neck: Neck supple. No thyromegaly present.  Cardiovascular: Normal rate, regular rhythm and normal heart sounds. Exam reveals no gallop and no friction rub.  No murmur heard. Respiratory: Effort normal and breath sounds normal. No respiratory distress. She has no wheezes. She has no rales.  GI: Soft. She exhibits no distension. There is no tenderness.  Musculoskeletal: She exhibits no edema.  Lymphadenopathy:    She has no cervical adenopathy.  Neurological: She is alert and oriented to person, place, and time. No cranial nerve deficit. She exhibits normal muscle tone.  Skin: Skin is warm and dry.    CARDIAC CATH Conclusion     Acute Mrg lesion is 95% stenosed.  Mid RCA to Dist RCA lesion is 100% stenosed.  Prox RCA to Mid RCA lesion is 50% stenosed.  Mid RCA lesion is 80% stenosed.  Mid LM to Dist LM lesion is 25% stenosed.  Ost LAD lesion is 40% stenosed.  Prox LAD-1 lesion is 95% stenosed.  Prox LAD-2 lesion is 50% stenosed.  Mid LAD-1 lesion is 90% stenosed.  Mid LAD-2 lesion is 80% stenosed.  Dist LAD lesion is 80% stenosed.  The left ventricular ejection fraction is 50-55% by visual estimate.  LV end diastolic pressure is low.   Severe coronary calcification involving the LAD, circumflex, and RCA.  There is smooth 20 to 25% narrowing in the distal left main.  The LAD has 40% proximal calcified stenosis.  After the first septal perforating artery and be in the region of the first diagonal vessel the LAD is severely calcified with 95% stenosis.  There is diffuse disease in the mid segment with 50%  narrowing followed by 90% stenosis with distal 80% stenoses.  Calcified left circumflex vessel without high-grade obstructive disease.  Calcified RCA with 50% proximal 80% mid and total occlusion prior to the acute margin.  There is extensive left-to-right collateralization to the distal RCA via the left circulation.  Low normal global LV function with an ejection fraction at 50 to 55%.  There is small focal region of mild mid anterolateral posterior basal inferior hypo-contractility.  LVEDP  8 mm Hg.  RECOMMENDATION: Patient has severe coronary calcification.  Will review with colleagues.  Recommend surgical consultation for consideration of CABG revascularization surgery.  If patient is turned down surgically, consider atherectomy of proximal to mid LAD but there is diffuse distal LAD disease.  Will increase medical regimen and maximize ranolazine to 1000 mg twice a day.  Will hold Plavix but if turned down for surgery resume DAPT. Aggressive lipid-lowering therapy with target LDL  less than 70.   I personally reviewed the cath images. They show severe 2 vessel CAD with diffusely diseased target vessels.  Assessment/Plan: 77 yo woman with known CAD who presented with unstable coronary syndrome, but ruled out for MI. Cath shows severe 2 vessel CAD with preserved LV function.   This is a difficult situation with no definitive best option given the poor quality of her target vessels. CABG can be done, but intermediate to long term results are likely to be poor due to severity of disease. There is a significant chance CABG will be ineffective in relieving angina even in the short term.  She has had vein stripping. I have ordered vein mapping to see if there is any usable conduit in the legs.  Will discuss with Cardiology.  Cassie Campbell 09/28/2018, 5:29 PM

## 2018-09-28 NOTE — Progress Notes (Signed)
Progress Note  Patient Name: Cassie Campbell Date of Encounter: 09/28/2018  Primary Cardiologist: Shelva Majestic, MD   Subjective   Pt doing well today. Awaiting surgical consultation. Denies chest pain or palpitations.  Inpatient Medications    Scheduled Meds: . atorvastatin  80 mg Oral q1800  . bisoprolol  5 mg Oral Daily  . ezetimibe  10 mg Oral Daily  . famotidine  20 mg Oral QHS  . mometasone-formoterol  2 puff Inhalation BID  . pantoprazole  40 mg Oral Daily  . ranolazine  1,000 mg Oral BID  . sodium chloride flush  3 mL Intravenous Q12H   Continuous Infusions: . sodium chloride 10 mL/hr at 09/28/18 0313  . heparin 1,050 Units/hr (09/28/18 0603)   PRN Meds: sodium chloride, acetaminophen, ALPRAZolam, nitroGLYCERIN, ondansetron (ZOFRAN) IV, sodium chloride flush, sodium chloride flush, zolpidem   Vital Signs    Vitals:   09/28/18 0034 09/28/18 0532 09/28/18 0535 09/28/18 0750  BP: 119/70  124/74 139/78  Pulse: 68  76 70  Resp: 18     Temp: 98.7 F (37.1 C)  98.2 F (36.8 C) 98.1 F (36.7 C)  TempSrc: Oral  Oral Oral  SpO2: 95%  93% 97%  Weight:  56.6 kg    Height:        Intake/Output Summary (Last 24 hours) at 09/28/2018 0854 Last data filed at 09/28/2018 0533 Gross per 24 hour  Intake 1265.05 ml  Output 1950 ml  Net -684.95 ml   Filed Weights   09/26/18 1841 09/27/18 0430 09/28/18 0532  Weight: 56.5 kg 57 kg 56.6 kg   Physical Exam   General: Elderly, NAD Skin: Warm, dry, intact  Head: Normocephalic, atraumatic,  clear, moist mucus membranes. Neck: Negative for carotid bruits. No JVD Lungs:Clear to ausculation bilaterally. No wheezes, rales, or rhonchi. Breathing is unlabored. Cardiovascular: RRR with S1 S2. No murmurs, rubs, gallops, or LV heave appreciated. Abdomen: Soft, non-tender, non-distended with normoactive bowel sounds. No obvious abdominal masses. MSK: Strength and tone appear normal for age. 5/5 in all  extremities Extremities: No edema. No clubbing or cyanosis. DP/PT pulses 2+ bilaterally Neuro: Alert and oriented. No focal deficits. No facial asymmetry. MAE spontaneously. Psych: Responds to questions appropriately with normal affect.    Labs    Chemistry Recent Labs  Lab 09/26/18 1456 09/28/18 0413  NA 136 138  K 4.0 3.7  CL 101 106  CO2 25 24  GLUCOSE 113* 85  BUN 11 5*  CREATININE 0.83 0.60  CALCIUM 9.7 9.0  PROT 9.0*  --   ALBUMIN 3.4*  --   AST 32  --   ALT 20  --   ALKPHOS 93  --   BILITOT 0.6  --   GFRNONAA >60 >60  GFRAA >60 >60  ANIONGAP 10 8     Hematology Recent Labs  Lab 09/26/18 1457 09/27/18 0349 09/28/18 0413  WBC 10.3 9.4 8.4  RBC 4.04 3.77* 3.72*  HGB 10.8* 10.1* 9.9*  HCT 34.9* 32.0* 33.0*  MCV 86.4 84.9 88.7  MCH 26.7 26.8 26.6  MCHC 30.9 31.6 30.0  RDW 17.2* 17.5* 17.4*  PLT 591* 518* 467*    Cardiac Enzymes Recent Labs  Lab 09/26/18 1456 09/26/18 2059 09/27/18 0349  TROPONINI <0.03 <0.03 <0.03    Recent Labs  Lab 09/26/18 1507  TROPIPOC 0.00     BNPNo results for input(s): BNP, PROBNP in the last 168 hours.   DDimer No results for input(s): DDIMER in  the last 168 hours.   Radiology    Dg Chest 2 View  Result Date: 09/26/2018 CLINICAL DATA:  Chest pain EXAM: CHEST - 2 VIEW COMPARISON:  11/29/2017 FINDINGS: Heart size and vascularity normal. Chronic right middle lobe atelectasis unchanged from prior studies. No acute infiltrate or effusion. Mild bibasilar scarring unchanged. IMPRESSION: No acute abnormality. Bibasilar scarring and right middle lobe chronic atelectasis. Electronically Signed   By: Franchot Gallo M.D.   On: 09/26/2018 16:04   Telemetry    09/28/18 NSR - Personally Reviewed  ECG    No new tracing as of 09/28/18 - Personally Reviewed  Cardiac Studies   Diagnostic LHC 10/16/14 Coronary angiography: Coronary dominance:right    Left Main: mildly calcified with no significant obstructive  disease.  Left Anterior Descending (LAD): Heavily calcified throughout its course. There is diffuse 20% disease proximally and 40-50% disease in the midsegment. There is 70% stenosis in multiple areas distally all the way close to the apex  1st diagonal (D1): Small in size with minor irregularities.  2nd diagonal (D2): Small in size with minor irregularities.  3rd diagonal (D3): Small in size with minor irregularities.  Circumflex (LCx): Normal in size and nondominant. The vessel is moderately calcified with 20-30% stenosis in the midsegment.  1st obtuse marginal: Small in size with 95% proximal stenosis.  2nd obtuse marginal: Large in size with minor irregularities.  3rd obtuse marginal: Normal in size with minor irregularities.  AV groove continuation segment: Medium in size with minor irregularities.  Right Coronary Artery: Normal in size and dominant. The vessel is heavily calcified especially in the mid and distal segment. There is 20% proximal stenosis. There is 95% heavily calcified stenosis in the midsegment. There is 99% discrete stenosis in the mid to distal segment with TIMI 1 flow throughout the vessel. The distal vessel into the PDA is underfilling and could not be evaluated although there might be some ostial PDA disease.  PCI Data: Vessel - RCA/Segment - mid Percent Stenosis (pre) 95% TIMI-flow 1 balloon angioplasty only (not successful) Percent Stenosis (post) 90 TIMI-flow (post) 3   Final Conclusions:  1. Significant 2 vessel coronary artery disease. The distal LAD is diffusely diseased and not suitable for revascularization. The culprit for non-ST elevation myocardial infarction is likely the right coronary artery which is heavily calcified. 2. Normal LV systolic function by echo. Mildly elevated left ventricular end-diastolic pressure. 3. Unsuccessful RCA PCI due to inability to dilate the lesion as the result of heavy  calcifications.   Failed RCA PCI Attempt 10/17/14  IMPRESSION:  Extremely difficult percutaneous coronary intervention with initial plans for high-speed rotational atherectomy to the RCA which ultimately was never able to be performed, and aborted PTCA attempt of the extremely calcified subtotal RCA at the acute margina and multiple tandem distal stenoses  Patient Profile     77 y.o. female with a history of complex CAD, s/p unsuccessful attempt at PCI Oct 2015 (medically managed), ASA allergy, HTN, chronic RBBB and asthma, directly admitted from our office 09/26/18 for active CP c/w unstable angina.   Assessment & Plan    1. CAD with unstable angina: -Pt with a h/o NSTEMI in 2015 with occluded RCA, however failed PCI attempt, also with mild to moderate diffuse LAD disease with recommendations for medical management>>presented to [facility] with unstable angina -She underwent cardiac cath on 09/27/18 which showed severe coronary calcification with recommendations for surgical consultation for consideration of CABG revascularization surgery. Per cath note, if patient is turned  down surgically, consider atherectomy of proximal to mid LAD but there is diffuse distal LAD disease.   -Medical regimen increased with maximized ranolazine to 1000 mg twice a day.   -Plavix on hold>>>but if turned down for surgery resume DAPT -Continue IV Hep gtt per pharmacy for now>>no s/s of active bleeding  -Aggressive lipid-lowering therapy with target LDL less than 70>>Lipitor increased to 80mg  daily, along with Zetia   -Denies chest pain today, resting comfortably with questions concerning possible CABG   Signed, Kathyrn Drown NP-C [place] Pager: [phone] 09/28/2018, 8:54 AM     For questions or updates, please contact   Please consult [email or web address] for contact info under Cardiology/STEMI.

## 2018-09-28 NOTE — H&P (View-Only) (Signed)
Reason for Consult:2 vessel CAD Referring Physician: Dr. [name] is an 77 y.o. female.  HPI: 77 yo woman presented with a cc/o CP  Cassie Campbell is a 77 yo woman with known CAD with an MI in 2015. Attempted PCI- unsuccessful, treated medically. Also has a history of hypertension, hyperlipidemia, GRED, obesity, depression, asthma, arthritis and DDD. She was in her usual state of health until _0 15  . CATARACT EXTRACTION Right 02/02/2018   Dr Satira Sark  . CATARACT  EXTRACTION Left 03/30/2018  . [place] SURGERY  12/01  . LEFT HEART CATH AND CORONARY ANGIOGRAPHY N/A 09/27/2018   Procedure: LEFT HEART CATH AND CORONARY ANGIOGRAPHY;  Surgeon: Troy Sine, MD;  Location: [facility];  Service: Cardiovascular;  Laterality: N/A;  . LEFT HEART CATHETERIZATION WITH CORONARY ANGIOGRAM N/A 10/16/2014   Procedure: LEFT HEART CATHETERIZATION WITH CORONARY ANGIOGRAM;  Surgeon: Blane Ohara, MD;  Location: [facility] CATH LAB;  Service: Cardiovascular;  Laterality: N/A;  . PERCUTANEOUS CORONARY ROTOBLATOR INTERVENTION (PCI-R) N/A 10/17/2014   Procedure: PERCUTANEOUS CORONARY ROTOBLATOR INTERVENTION (PCI-R);  Surgeon: Troy Sine, MD;  Location: [facility] CATH LAB;  Service: Cardiovascular;  Laterality: N/A;  . VEIN LIGATION AND STRIPPING    . VIDEO BRONCHOSCOPY Bilateral 02/05/2015   Procedure: VIDEO BRONCHOSCOPY WITHOUT FLUORO;  Surgeon: Tanda Rockers, MD;  Location: [facility];  Service: Endoscopy;  Laterality: Bilateral;  . VULVA /PERINEUM BIOPSY  12-30-10   --epidermoid cyst    Family History  Problem Relation Age of Onset  . Diabetes Mother        AODM  . Stroke Mother   . Hypertension Mother   . Heart disease Father   . Diabetes Sister        AODM  . Breast cancer Sister   . Hypertension Sister   . Breast cancer Daughter 11  dec--mets to liver/spine    Social History:  reports that she has never smoked. She has never used smokeless tobacco. She reports that she does not drink alcohol or use drugs.  Allergies:  Allergies  Allergen Reactions  . Fish-Derived Products Shortness Of Breath, Swelling and Rash  . Penicillins Shortness Of Breath, Swelling and Rash    PATIENT HAS HAD A PCN REACTION WITH IMMEDIATE RASH, FACIAL/TONGUE/THROAT SWELLING, SOB, OR LIGHTHEADEDNESS WITH HYPOTENSION:  #  #  #  YES  #  #  #   Has patient had a PCN reaction causing severe rash involving mucus membranes or skin necrosis: No PATIENT HAS HAD A PCN REACTION  THAT REQUIRED HOSPITALIZATION:  #  #  #  YES  #  #  #   Has patient had a PCN reaction occurring within the last 10 years: No    . Aspirin Swelling and Rash    SWELLING REACTION UNSPECIFIED   . Brilinta [Ticagrelor] Rash    Started Brilinta 09/2014 - rash - unsure which it is directly related to  . Codeine Phosphate Nausea Only  . Diphenhydramine Hcl Rash    Medications:  Scheduled: . atorvastatin  80 mg Oral q1800  . bisoprolol  5 mg Oral Daily  . ezetimibe  10 mg Oral Daily  . famotidine  20 mg Oral QHS  . feeding supplement (ENSURE ENLIVE)  237 mL Oral BID BM  . mometasone-formoterol  2 puff Inhalation BID  . pantoprazole  40 mg Oral Daily  . ranolazine  1,000 mg Oral BID  . sodium chloride flush  3 mL Intravenous Q12H    Results for orders placed or performed during the hospital encounter of 09/26/18 (from the past 48 hour(s))  MRSA PCR Screening     Status: None   Collection Time: 09/26/18  6:36 PM  Result Value Ref Range   MRSA by PCR NEGATIVE NEGATIVE    Comment:        The GeneXpert MRSA Assay (FDA approved for NASAL specimens only), is one component of a comprehensive MRSA colonization surveillance program. It is not intended to diagnose MRSA infection nor to guide or monitor treatment for MRSA infections. Performed at [facility], [place] [address] [place], [place] [address]   Troponin I     Status: None   Collection Time: 09/26/18  8:59 PM  Result Value Ref Range   Troponin I <0.03 <0.03 ng/mL    Comment: Performed at [facility] [address], [place], [place] [address]  Heparin level (unfractionated)     Status: Abnormal   Collection Time: 09/27/18 12:01 AM  Result Value Ref Range   Heparin Unfractionated 0.12 (L) 0.30 - 0.70 IU/mL    Comment: (NOTE) If heparin results are below expected values, and patient dosage has  been confirmed, suggest follow up testing of antithrombin III levels. Performed at [facility], [place] [address] [place], [place] [address]   Troponin I     Status: None   Collection Time: 09/27/18  3:49 AM  Result Value Ref Range   Troponin I <0.03 <0.03 ng/mL    Comment: Performed at [facility] [address], [place] [address]  CBC     Status: Abnormal   Collection Time: 09/27/18  3:49 AM  Result Value Ref Range   WBC 9.4 4.0 - 10.5 K/uL   RBC 3.77 (L) 3.87 - 5.11 MIL/uL   Hemoglobin 10.1 (L) 12.0 -  15.0 g/dL   HCT 32.0 (L) 36.0 - 46.0 %   MCV 84.9 80.0 - 100.0 fL   MCH 26.8 26.0 - 34.0 pg   MCHC 31.6 30.0 - 36.0 g/dL   RDW 17.5 (H) 11.5 - 15.5 %   Platelets 518 (H) 150 - 400 K/uL   nRBC 0.0 0.0 - 0.2 %    Comment: Performed at [facility] [address], [place], [place] [address]  Heparin level (unfractionated)     Status: Abnormal   Collection Time: 09/27/18 10:26 AM  Result Value Ref Range   Heparin Unfractionated 0.16 (L) 0.30 - 0.70 IU/mL    Comment: (NOTE) If heparin results are below expected values, and patient dosage has  been confirmed, suggest follow up testing of antithrombin III levels. Performed at [facility], [place] [address], [place], [place] [address]   POCT Activated clotting time     Status: None   Collection Time: 09/27/18  3:19 PM  Result Value Ref Range   Activated Clotting Time 114 seconds  CBC     Status: Abnormal   Collection Time: 09/28/18  4:13 AM  Result Value Ref Range   WBC 8.4 4.0 - 10.5 K/uL   RBC 3.72 (L) 3.87 - 5.11 MIL/uL   Hemoglobin 9.9 (L) 12.0 - 15.0 g/dL   HCT 33.0 (L) 36.0 - 46.0 %   MCV 88.7 80.0 - 100.0 fL   MCH 26.6 26.0 - 34.0 pg   MCHC 30.0 30.0 - 36.0 g/dL   RDW 17.4 (H) 11.5 - 15.5 %   Platelets 467 (H) 150 - 400 K/uL   nRBC 0.0 0.0 - 0.2 %    Comment: Performed at [facility], [place] [address], [place], [place] [address]  Basic metabolic panel     Status: Abnormal   Collection Time: 09/28/18  4:13 AM  Result Value Ref Range   Sodium 138 135 - 145 mmol/L   Potassium 3.7 3.5 - 5.1 mmol/L    Chloride 106 98 - 111 mmol/L   CO2 24 22 - 32 mmol/L   Glucose, Bld 85 70 - 99 mg/dL   BUN 5 (L) 8 - 23 mg/dL   Creatinine, Ser 0.60 0.44 - 1.00 mg/dL   Calcium 9.0 8.9 - 10.3 mg/dL   GFR calc non Af Amer >60 >60 mL/min   GFR calc Af Amer >60 >60 mL/min    Comment: (NOTE) The eGFR has been calculated using the CKD EPI equation. This calculation has not been validated in all clinical situations. eGFR's persistently <60 mL/min signify possible Chronic Kidney Disease.    Anion gap 8 5 - 15    Comment: Performed at [facility] [address], [place], [place] [address]  Heparin level (unfractionated)     Status: Abnormal   Collection Time: 09/28/18  4:13 AM  Result Value Ref Range   Heparin Unfractionated 0.25 (L) 0.30 - 0.70 IU/mL    Comment: (NOTE) If heparin results are below expected values, and patient dosage has  been confirmed, suggest follow up testing of antithrombin III levels. Performed at [facility], [place] [address], [place], [place] [address]   Heparin level (unfractionated)     Status: None   Collection Time: 09/28/18  2:13 PM  Result Value Ref Range   Heparin Unfractionated 0.35 0.30 - 0.70 IU/mL    Comment: (NOTE) If heparin results are below expected values, and patient dosage has  been confirmed,  suggest follow up testing of antithrombin III levels. Performed at [facility], [place] [address] [place], [place] [address]      Review of Systems  Constitutional: Positive for malaise/fatigue.  Eyes: Negative for blurred vision and double vision.  Respiratory: Positive for sputum production.   Cardiovascular: Positive for chest pain.  Genitourinary: Negative for dysuria.  Musculoskeletal: Positive for back pain, joint pain and neck pain.  Neurological: Negative for dizziness, speech change and focal weakness.   Blood pressure 139/78, pulse 70, temperature 98.1 F (36.7 C), temperature source Oral, resp. rate 18, height '4\' 11"'$  (1.499 m),  weight 56.6 kg, last menstrual period 12/21/1999, SpO2 96 %. Physical Exam  Vitals reviewed. Constitutional: She is oriented to person, place, and time. She appears well-developed and well-nourished. No distress.  HENT:  Head: Normocephalic and atraumatic.  Mouth/Throat: No oropharyngeal exudate.  Eyes: Conjunctivae and EOM are normal. No scleral icterus.  Neck: Neck supple. No thyromegaly present.  Cardiovascular: Normal rate, regular rhythm and normal heart sounds. Exam reveals no gallop and no friction rub.  No murmur heard. Respiratory: Effort normal and breath sounds normal. No respiratory distress. She has no wheezes. She has no rales.  GI: Soft. She exhibits no distension. There is no tenderness.  Musculoskeletal: She exhibits no edema.  Lymphadenopathy:    She has no cervical adenopathy.  Neurological: She is alert and oriented to person, place, and time. No cranial nerve deficit. She exhibits normal muscle tone.  Skin: Skin is warm and dry.    CARDIAC CATH Conclusion     Acute Mrg lesion is 95% stenosed.  Mid RCA to Dist RCA lesion is 100% stenosed.  Prox RCA to Mid RCA lesion is 50% stenosed.  Mid RCA lesion is 80% stenosed.  Mid LM to Dist LM lesion is 25% stenosed.  Ost LAD lesion is 40% stenosed.  Prox LAD-1 lesion is 95% stenosed.  Prox LAD-2 lesion is 50% stenosed.  Mid LAD-1 lesion is 90% stenosed.  Mid LAD-2 lesion is 80% stenosed.  Dist LAD lesion is 80% stenosed.  The left ventricular ejection fraction is 50-55% by visual estimate.  LV end diastolic pressure is low.   Severe coronary calcification involving the LAD, circumflex, and RCA.  There is smooth 20 to 25% narrowing in the distal left main.  The LAD has 40% proximal calcified stenosis.  After the first septal perforating artery and be in the region of the first diagonal vessel the LAD is severely calcified with 95% stenosis.  There is diffuse disease in the mid segment with 50%  narrowing followed by 90% stenosis with distal 80% stenoses.  Calcified left circumflex vessel without high-grade obstructive disease.  Calcified RCA with 50% proximal 80% mid and total occlusion prior to the acute margin.  There is extensive left-to-right collateralization to the distal RCA via the left circulation.  Low normal global LV function with an ejection fraction at 50 to 55%.  There is small focal region of mild mid anterolateral posterior basal inferior hypo-contractility.  LVEDP  8 mm Hg.  RECOMMENDATION: Patient has severe coronary calcification.  Will review with colleagues.  Recommend surgical consultation for consideration of CABG revascularization surgery.  If patient is turned down surgically, consider atherectomy of proximal to mid LAD but there is diffuse distal LAD disease.  Will increase medical regimen and maximize ranolazine to 1000 mg twice a day.  Will hold Plavix but if turned down for surgery resume DAPT. Aggressive lipid-lowering therapy with target LDL  less than 70.   I personally reviewed the cath images. They show severe 2 vessel CAD with diffusely diseased target vessels.  Assessment/Plan: 77 yo woman with known CAD who presented with unstable coronary syndrome, but ruled out for MI. Cath shows severe 2 vessel CAD with preserved LV function.   This is a difficult situation with no definitive best option given the poor quality of her target vessels. CABG can be done, but intermediate to long term results are likely to be poor due to severity of disease. There is a significant chance CABG will be ineffective in relieving angina even in the short term.  She has had vein stripping. I have ordered vein mapping to see if there is any usable conduit in the legs.  Will discuss with Cardiology.  [name] 09/28/2018, 5:29 PM

## 2018-09-28 NOTE — Progress Notes (Signed)
Pre-op Cardiac Surgery  Carotid Findings:  Findings suggest 1-39% internal carotid artery stenosis bilaterally. Vertebral arteries are patent with antegrade flow.  Upper Extremity Right Left  Brachial Pressures 145-Triphasic 141-Triphasic  Radial Waveforms Triphasic Triphasic  Ulnar Waveforms Triphasic Triphasic  Palmar Arch (Allen's Test) Signal obliterates with radial compression, decreases <50% with ulnar compression. Signal obliterates with radial compression, is unaffected with ulnar compression.    Lower  Extremity Right Left  Dorsalis Pedis Triphasic Triphasic  Posterior Tibial Triphasic Triphasic   Bilateral lower extremity pedal artery waveforms are within normal limits at rest.   Incidental finding: there is a heterogenous area in the left supraclavicular area measuring 1.5cm. This is suggestive of a possible lymph node versus unknown etiology. Further imaging may be warranted if clinically indicated.  09/28/2018 12:16 PM Cassie Campbell, MHA, RVT, RDCS, RDMS

## 2018-09-29 ENCOUNTER — Inpatient Hospital Stay (HOSPITAL_COMMUNITY): Payer: Medicare Other

## 2018-09-29 DIAGNOSIS — Z01810 Encounter for preprocedural cardiovascular examination: Secondary | ICD-10-CM

## 2018-09-29 LAB — CBC
HCT: 30.3 % — ABNORMAL LOW (ref 36.0–46.0)
Hemoglobin: 9.7 g/dL — ABNORMAL LOW (ref 12.0–15.0)
MCH: 27.5 pg (ref 26.0–34.0)
MCHC: 32 g/dL (ref 30.0–36.0)
MCV: 85.8 fL (ref 80.0–100.0)
NRBC: 0 % (ref 0.0–0.2)
PLATELETS: 460 10*3/uL — AB (ref 150–400)
RBC: 3.53 MIL/uL — ABNORMAL LOW (ref 3.87–5.11)
RDW: 17.6 % — AB (ref 11.5–15.5)
WBC: 8.5 10*3/uL (ref 4.0–10.5)

## 2018-09-29 LAB — HEPARIN LEVEL (UNFRACTIONATED): HEPARIN UNFRACTIONATED: 0.38 [IU]/mL (ref 0.30–0.70)

## 2018-09-29 NOTE — Progress Notes (Signed)
Nutrition Brief Note  Patient identified on the Malnutrition Screening Tool (MST) Report.  PTA pateint had a decreased appetite for 2-3 weeks, but would still eat 3 meals per day. She has lost some weight, but not a significant amount for the time frame. Patient reports good intake since admission. She is consuming 100% of meals. Suspect intake of meals is meeting nutrition needs; RD will d/c Ensure Enlive.   Nutrition focused physical exam completed.  No muscle or subcutaneous fat depletion noticed.   Wt Readings from Last 15 Encounters:  09/29/18 57.2 kg  09/26/18 56.9 kg  06/15/18 59.9 kg  06/08/18 59.7 kg  04/20/18 60.8 kg  03/08/18 62.3 kg  02/09/18 63.5 kg  11/29/17 61.2 kg  11/24/17 61.7 kg  11/16/17 61.4 kg  11/09/17 63.5 kg  09/29/17 63.5 kg  08/15/17 63 kg  08/11/17 62.6 kg  08/10/17 61.6 kg    Body mass index is 25.47 kg/m. Patient meets criteria for normal weight based on current BMI.   Current diet order is heart healthy, patient is consuming approximately 100% of meals at this time. Labs and medications reviewed.   No nutrition interventions warranted at this time. If nutrition issues arise, please consult RD.    Molli Barrows, RD, LDN, [place] Pager [phone] After Hours Pager [phone]

## 2018-09-29 NOTE — Progress Notes (Signed)
ANTICOAGULATION CONSULT NOTE - Follow Up Consult  Pharmacy Consult for Heparin Indication: chest pain/ACS, CAD  Allergies  Allergen Reactions  . Fish-Derived Products Shortness Of Breath, Swelling and Rash  . Penicillins Shortness Of Breath, Swelling and Rash    PATIENT HAS HAD A PCN REACTION WITH IMMEDIATE RASH, FACIAL/TONGUE/THROAT SWELLING, SOB, OR LIGHTHEADEDNESS WITH HYPOTENSION:  #  #  #  YES  #  #  #   Has patient had a PCN reaction causing severe rash involving mucus membranes or skin necrosis: No PATIENT HAS HAD A PCN REACTION THAT REQUIRED HOSPITALIZATION:  #  #  #  YES  #  #  #   Has patient had a PCN reaction occurring within the last 10 years: No    . Aspirin Swelling and Rash    SWELLING REACTION UNSPECIFIED   . Brilinta [Ticagrelor] Rash    Started Brilinta 09/2014 - rash - unsure which it is directly related to  . Codeine Phosphate Nausea Only  . Diphenhydramine Hcl Rash    Patient Measurements: Height: 4\' 11"  (149.9 cm) Weight: 126 lb 1.6 oz (57.2 kg) IBW/kg (Calculated) : 43.2 Heparin Dosing Weight: 55 kg  Vital Signs: Temp: 98.7 F (37.1 C) (10/11 0934) Temp Source: Oral (10/11 0934) BP: 136/75 (10/11 1506) Pulse Rate: 79 (10/11 0437)  Labs: Recent Labs    09/26/18 2059  09/27/18 0349  09/28/18 0413 09/28/18 1413 09/29/18 0542 09/29/18 1337  HGB  --    < > 10.1*  --  9.9*  --  9.7*  --   HCT  --   --  32.0*  --  33.0*  --  30.3*  --   PLT  --   --  518*  --  467*  --  460*  --   HEPARINUNFRC  --    < >  --    < > 0.25* 0.35  --  0.38  CREATININE  --   --   --   --  0.60  --   --   --   TROPONINI <0.03  --  <0.03  --   --   --   --   --    < > = values in this interval not displayed.    Estimated Creatinine Clearance: 46.1 mL/min (by C-G formula based on SCr of 0.6 mg/dL).  Assessment:  77 yr old female continues on IV heparin for CAD. History of CAD s/p unsuccessful attempt at PCI in Oct '15 managed medically who presented on 10/8 with CP.  S/p cardiac cath on 10/10.  TCTS consulted for consideration for CABG vs atherectomy.     Heparin level is therapeutic (0.38) on 1050 units/hr    Plavix held for possible surgery, last dose 10/9.    Not currently on Aspirin.  Hx swelling/rash noted in allergies.  Goal of Therapy:  Heparin level 0.3-0.7 units/ml Monitor platelets by anticoagulation protocol: Yes   Plan:   Continue heparin drip at 1050 units/hr  Daily heparin level and CBC while on heparin.  Follow up plans.  Arty Baumgartner, [place] Pager: [phone] or phone: [phone] 09/29/2018,3:44 PM

## 2018-09-29 NOTE — Progress Notes (Signed)
Lower extremityies GSV mapping completed.      Cassie Campbell (RDMS RVT) 09/29/18 12:45 PM

## 2018-09-29 NOTE — Progress Notes (Signed)
Progress Note  Patient Name: Cassie Campbell Date of Encounter: 09/29/2018  Primary Cardiologist: Shelva Majestic, MD   Subjective   Pt doing well today. Denies chest pain or palpitations. Had vein mapping this AM.  Inpatient Medications    Scheduled Meds: . atorvastatin  80 mg Oral q1800  . bisoprolol  5 mg Oral Daily  . ezetimibe  10 mg Oral Daily  . famotidine  20 mg Oral QHS  . feeding supplement (ENSURE ENLIVE)  237 mL Oral BID BM  . mometasone-formoterol  2 puff Inhalation BID  . pantoprazole  40 mg Oral Daily  . ranolazine  1,000 mg Oral BID  . sodium chloride flush  3 mL Intravenous Q12H   Continuous Infusions: . sodium chloride 10 mL/hr at 09/28/18 1900  . heparin 1,050 Units/hr (09/29/18 0150)   PRN Meds: sodium chloride, acetaminophen, ALPRAZolam, nitroGLYCERIN, ondansetron (ZOFRAN) IV, sodium chloride flush, sodium chloride flush, zolpidem   Vital Signs    Vitals:   09/28/18 2155 09/29/18 0017 09/29/18 0437 09/29/18 0934  BP:  119/68 109/69 118/74  Pulse:  68 79   Resp:      Temp:  98.2 F (36.8 C) 97.8 F (36.6 C) 98.7 F (37.1 C)  TempSrc:  Oral Oral Oral  SpO2: 95% 96% 93%   Weight:   57.2 kg   Height:        Intake/Output Summary (Last 24 hours) at 09/29/2018 1114 Last data filed at 09/29/2018 0647 Gross per 24 hour  Intake 799.57 ml  Output 1300 ml  Net -500.43 ml   Filed Weights   09/27/18 0430 09/28/18 0532 09/29/18 0437  Weight: 57 kg 56.6 kg 57.2 kg   Physical Exam   General: Elderly, NAD Skin: Warm, dry, intact  Head: Normocephalic, atraumatic,  clear, moist mucus membranes. Neck: Negative for carotid bruits. No JVD Lungs:Clear to ausculation bilaterally. No wheezes, rales, or rhonchi. Breathing is unlabored. Cardiovascular: RRR with S1 S2. No murmurs, rubs, gallops, or LV heave appreciated. Abdomen: Soft, non-tender, non-distended with normoactive bowel sounds. No obvious abdominal masses. MSK: Strength and tone appear  normal for age. 5/5 in all extremities Extremities: No edema. No clubbing or cyanosis. DP/PT pulses 2+ bilaterally Neuro: Alert and oriented. No focal deficits. No facial asymmetry. MAE spontaneously. Psych: Responds to questions appropriately with normal affect.    Labs    Chemistry Recent Labs  Lab 09/26/18 1456 09/28/18 0413  NA 136 138  K 4.0 3.7  CL 101 106  CO2 25 24  GLUCOSE 113* 85  BUN 11 5*  CREATININE 0.83 0.60  CALCIUM 9.7 9.0  PROT 9.0*  --   ALBUMIN 3.4*  --   AST 32  --   ALT 20  --   ALKPHOS 93  --   BILITOT 0.6  --   GFRNONAA >60 >60  GFRAA >60 >60  ANIONGAP 10 8     Hematology Recent Labs  Lab 09/27/18 0349 09/28/18 0413 09/29/18 0542  WBC 9.4 8.4 8.5  RBC 3.77* 3.72* 3.53*  HGB 10.1* 9.9* 9.7*  HCT 32.0* 33.0* 30.3*  MCV 84.9 88.7 85.8  MCH 26.8 26.6 27.5  MCHC 31.6 30.0 32.0  RDW 17.5* 17.4* 17.6*  PLT 518* 467* 460*    Cardiac Enzymes Recent Labs  Lab 09/26/18 1456 09/26/18 2059 09/27/18 0349  TROPONINI <0.03 <0.03 <0.03    Recent Labs  Lab 09/26/18 1507  TROPIPOC 0.00     BNPNo results for input(s): BNP, PROBNP  in the last 168 hours.   DDimer No results for input(s): DDIMER in the last 168 hours.   Radiology    No results found. Telemetry    09/29/18 NSR - Personally Reviewed  ECG    No new tracing as of 09/28/18 - Personally Reviewed  Cardiac Studies   Diagnostic LHC 10/16/14 Coronary angiography: Coronary dominance:right    Left Main: mildly calcified with no significant obstructive disease.  Left Anterior Descending (LAD): Heavily calcified throughout its course. There is diffuse 20% disease proximally and 40-50% disease in the midsegment. There is 70% stenosis in multiple areas distally all the way close to the apex  1st diagonal (D1): Small in size with minor irregularities.  2nd diagonal (D2): Small in size with minor irregularities.  3rd diagonal (D3): Small in size with minor  irregularities.  Circumflex (LCx): Normal in size and nondominant. The vessel is moderately calcified with 20-30% stenosis in the midsegment.  1st obtuse marginal: Small in size with 95% proximal stenosis.  2nd obtuse marginal: Large in size with minor irregularities.  3rd obtuse marginal: Normal in size with minor irregularities.  AV groove continuation segment: Medium in size with minor irregularities.  Right Coronary Artery: Normal in size and dominant. The vessel is heavily calcified especially in the mid and distal segment. There is 20% proximal stenosis. There is 95% heavily calcified stenosis in the midsegment. There is 99% discrete stenosis in the mid to distal segment with TIMI 1 flow throughout the vessel. The distal vessel into the PDA is underfilling and could not be evaluated although there might be some ostial PDA disease.  PCI Data: Vessel - RCA/Segment - mid Percent Stenosis (pre) 95% TIMI-flow 1 balloon angioplasty only (not successful) Percent Stenosis (post) 90 TIMI-flow (post) 3   Final Conclusions:  1. Significant 2 vessel coronary artery disease. The distal LAD is diffusely diseased and not suitable for revascularization. The culprit for non-ST elevation myocardial infarction is likely the right coronary artery which is heavily calcified. 2. Normal LV systolic function by echo. Mildly elevated left ventricular end-diastolic pressure. 3. Unsuccessful RCA PCI due to inability to dilate the lesion as the result of heavy calcifications.   Failed RCA PCI Attempt 10/17/14  IMPRESSION:  Extremely difficult percutaneous coronary intervention with initial plans for high-speed rotational atherectomy to the RCA which ultimately was never able to be performed, and aborted PTCA attempt of the extremely calcified subtotal RCA at the acute margina and multiple tandem distal stenoses  Patient Profile     77 y.o. female with a history of complex CAD,  s/p unsuccessful attempt at PCI Oct 2015 (medically managed), ASA allergy, HTN, chronic RBBB and asthma, directly admitted from our office 09/26/18 for active CP c/w unstable angina.   Assessment & Plan    1. CAD with unstable angina: -cardiac cath on 09/27/18: severe coronary calcification with recommendations for surgical consultation for consideration of CABG revascularization surgery. Per cath note, if patient is turned down surgically, consider atherectomy of proximal to mid LAD but there is diffuse distal LAD disease.   -Medical regimen: on bisoprolol, maximized ranolazine to 1000 mg twice a day.   -Plavix on hold>>>but if turned down for surgery resume DAPT -Continue IV Hep gtt per pharmacy for now>>no s/s of active bleeding  -Aggressive lipid-lowering therapy with target LDL less than 70>>Lipitor increased to 80mg  daily, along with Zetia   -awaiting decision on whether CABG is the preferred option. Appreciate CT surgery input. Had vein mapping today.  Inactive  issues: -hypertension: well controlled -asthma -aspirin allergy: on clopidogrel prior to admission  Please consult [email or web address] for contact info under Cardiology/STEMI.  Buford Dresser, MD, PhD [facility]  [address], [place], [place] [address] [phone]

## 2018-09-30 LAB — CBC
HCT: 31.7 % — ABNORMAL LOW (ref 36.0–46.0)
Hemoglobin: 9.9 g/dL — ABNORMAL LOW (ref 12.0–15.0)
MCH: 26.9 pg (ref 26.0–34.0)
MCHC: 31.2 g/dL (ref 30.0–36.0)
MCV: 86.1 fL (ref 80.0–100.0)
PLATELETS: 494 10*3/uL — AB (ref 150–400)
RBC: 3.68 MIL/uL — AB (ref 3.87–5.11)
RDW: 17.7 % — AB (ref 11.5–15.5)
WBC: 9.2 10*3/uL (ref 4.0–10.5)
nRBC: 0 % (ref 0.0–0.2)

## 2018-09-30 LAB — HEPARIN LEVEL (UNFRACTIONATED): HEPARIN UNFRACTIONATED: 0.3 [IU]/mL (ref 0.30–0.70)

## 2018-09-30 NOTE — Progress Notes (Signed)
Progress Note  Patient Name: Cassie Campbell Date of Encounter: 09/30/2018  Primary Cardiologist: Shelva Majestic, MD   Subjective   LE venous dopplers pending - await recommendations from CT surgery as to whether we will proceed with CABG vs PCI attempt. No further chest pain overnight.  Inpatient Medications    Scheduled Meds: . atorvastatin  80 mg Oral q1800  . bisoprolol  5 mg Oral Daily  . ezetimibe  10 mg Oral Daily  . famotidine  20 mg Oral QHS  . mometasone-formoterol  2 puff Inhalation BID  . pantoprazole  40 mg Oral Daily  . ranolazine  1,000 mg Oral BID  . sodium chloride flush  3 mL Intravenous Q12H   Continuous Infusions: . sodium chloride 10 mL/hr at 09/29/18 0606  . heparin 1,050 Units/hr (09/30/18 0212)   PRN Meds: sodium chloride, acetaminophen, ALPRAZolam, nitroGLYCERIN, ondansetron (ZOFRAN) IV, sodium chloride flush, sodium chloride flush, zolpidem   Vital Signs    Vitals:   09/30/18 0025 09/30/18 0424 09/30/18 0817 09/30/18 0939  BP: 123/66 127/77  136/72  Pulse: 75 79  76  Resp: 15 17    Temp: 98.3 F (36.8 C) 98 F (36.7 C)  98.1 F (36.7 C)  TempSrc: Oral Oral  Oral  SpO2: 94% 95% 95% 99%  Weight:      Height:        Intake/Output Summary (Last 24 hours) at 09/30/2018 1019 Last data filed at 09/30/2018 0940 Gross per 24 hour  Intake 996.46 ml  Output 1900 ml  Net -903.54 ml   Filed Weights   09/27/18 0430 09/28/18 0532 09/29/18 0437  Weight: 57 kg 56.6 kg 57.2 kg   Physical Exam   General appearance: alert and no distress Neck: no carotid bruit, no JVD and thyroid not enlarged, symmetric, no tenderness/mass/nodules Lungs: clear to auscultation bilaterally Heart: regular rate and rhythm, S1, S2 normal, no murmur, click, rub or gallop Abdomen: soft, non-tender; bowel sounds normal; no masses,  no organomegaly Extremities: extremities normal, atraumatic, no cyanosis or edema Pulses: 2+ and symmetric Skin: Skin color,  texture, turgor normal. No rashes or lesions Neurologic: Grossly normal Psych: Pleasant   Labs    Chemistry Recent Labs  Lab 09/26/18 1456 09/28/18 0413  NA 136 138  K 4.0 3.7  CL 101 106  CO2 25 24  GLUCOSE 113* 85  BUN 11 5*  CREATININE 0.83 0.60  CALCIUM 9.7 9.0  PROT 9.0*  --   ALBUMIN 3.4*  --   AST 32  --   ALT 20  --   ALKPHOS 93  --   BILITOT 0.6  --   GFRNONAA >60 >60  GFRAA >60 >60  ANIONGAP 10 8     Hematology Recent Labs  Lab 09/28/18 0413 09/29/18 0542 09/30/18 0431  WBC 8.4 8.5 9.2  RBC 3.72* 3.53* 3.68*  HGB 9.9* 9.7* 9.9*  HCT 33.0* 30.3* 31.7*  MCV 88.7 85.8 86.1  MCH 26.6 27.5 26.9  MCHC 30.0 32.0 31.2  RDW 17.4* 17.6* 17.7*  PLT 467* 460* 494*    Cardiac Enzymes Recent Labs  Lab 09/26/18 1456 09/26/18 2059 09/27/18 0349  TROPONINI <0.03 <0.03 <0.03    Recent Labs  Lab 09/26/18 1507  TROPIPOC 0.00     BNPNo results for input(s): BNP, PROBNP in the last 168 hours.   DDimer No results for input(s): DDIMER in the last 168 hours.   Radiology    No results found.  Telemetry    Sinus rhythm - Personally Reviewed  ECG    Deferred  Cardiac Studies   Diagnostic LHC 10/16/14 Coronary angiography: Coronary dominance:right    Left Main: mildly calcified with no significant obstructive disease.  Left Anterior Descending (LAD): Heavily calcified throughout its course. There is diffuse 20% disease proximally and 40-50% disease in the midsegment. There is 70% stenosis in multiple areas distally all the way close to the apex  1st diagonal (D1): Small in size with minor irregularities.  2nd diagonal (D2): Small in size with minor irregularities.  3rd diagonal (D3): Small in size with minor irregularities.  Circumflex (LCx): Normal in size and nondominant. The vessel is moderately calcified with 20-30% stenosis in the midsegment.  1st obtuse marginal: Small in size with 95% proximal stenosis.  2nd obtuse  marginal: Large in size with minor irregularities.  3rd obtuse marginal: Normal in size with minor irregularities.  AV groove continuation segment: Medium in size with minor irregularities.  Right Coronary Artery: Normal in size and dominant. The vessel is heavily calcified especially in the mid and distal segment. There is 20% proximal stenosis. There is 95% heavily calcified stenosis in the midsegment. There is 99% discrete stenosis in the mid to distal segment with TIMI 1 flow throughout the vessel. The distal vessel into the PDA is underfilling and could not be evaluated although there might be some ostial PDA disease.  PCI Data: Vessel - RCA/Segment - mid Percent Stenosis (pre) 95% TIMI-flow 1 balloon angioplasty only (not successful) Percent Stenosis (post) 90 TIMI-flow (post) 3   Final Conclusions:  1. Significant 2 vessel coronary artery disease. The distal LAD is diffusely diseased and not suitable for revascularization. The culprit for non-ST elevation myocardial infarction is likely the right coronary artery which is heavily calcified. 2. Normal LV systolic function by echo. Mildly elevated left ventricular end-diastolic pressure. 3. Unsuccessful RCA PCI due to inability to dilate the lesion as the result of heavy calcifications.   Failed RCA PCI Attempt 10/17/14  IMPRESSION:  Extremely difficult percutaneous coronary intervention with initial plans for high-speed rotational atherectomy to the RCA which ultimately was never able to be performed, and aborted PTCA attempt of the extremely calcified subtotal RCA at the acute margina and multiple tandem distal stenoses  Patient Profile     77 y.o. female with a history of complex CAD, s/p unsuccessful attempt at PCI Oct 2015 (medically managed), ASA allergy, HTN, chronic RBBB and asthma, directly admitted from our office 09/26/18 for active CP c/w unstable angina.   Assessment & Plan    1. CAD with  unstable angina: -cardiac cath on 09/27/18: severe coronary calcification with recommendations for surgical consultation for consideration of CABG revascularization surgery. Per cath note, if patient is turned down surgically, consider atherectomy of proximal to mid LAD but there is diffuse distal LAD disease.   -Medical regimen: on bisoprolol, maximized ranolazine to 1000 mg twice a day.   -Plavix on hold -Continue IV Hep gtt per pharmacy for now>>no s/s of active bleeding  -Aggressive lipid-lowering therapy with target LDL less than 70>>Lipitor increased to 80mg  daily, along with Zetia   -Per the patient- she was told plan per Dr. Roxan Hockey was for CABG next Wednesday   Inactive issues: -hypertension: well controlled -anemia: stable H/H at 9/32. -asthma -aspirin allergy: on clopidogrel prior to admission - Plavix on hold for CABG.   Pixie Casino, MD, [facility], [place] Director of the [facility]  Lip[facility]  [facility] Diplomate of the American Board of Clinical Lipidology Attending Cardiologist  Direct Dial: [phone]  Fax: [phone]  Website:  [email or web address]

## 2018-09-30 NOTE — Progress Notes (Addendum)
ANTICOAGULATION CONSULT NOTE - Follow Up Consult  Pharmacy Consult for Heparin Indication: chest pain/ACS, CAD  Allergies  Allergen Reactions  . Fish-Derived Products Shortness Of Breath, Swelling and Rash  . Penicillins Shortness Of Breath, Swelling and Rash    PATIENT HAS HAD A PCN REACTION WITH IMMEDIATE RASH, FACIAL/TONGUE/THROAT SWELLING, SOB, OR LIGHTHEADEDNESS WITH HYPOTENSION:  #  #  #  YES  #  #  #   Has patient had a PCN reaction causing severe rash involving mucus membranes or skin necrosis: No PATIENT HAS HAD A PCN REACTION THAT REQUIRED HOSPITALIZATION:  #  #  #  YES  #  #  #   Has patient had a PCN reaction occurring within the last 10 years: No    . Aspirin Swelling and Rash    SWELLING REACTION UNSPECIFIED   . Brilinta [Ticagrelor] Rash    Started Brilinta 09/2014 - rash - unsure which it is directly related to  . Codeine Phosphate Nausea Only  . Diphenhydramine Hcl Rash    Patient Measurements: Height: 4\' 11"  (149.9 cm) Weight: 126 lb 1.6 oz (57.2 kg) IBW/kg (Calculated) : 43.2 Heparin Dosing Weight: 55 kg  Vital Signs: Temp: 98.1 F (36.7 C) (10/12 0939) Temp Source: Oral (10/12 0939) BP: 136/72 (10/12 0939) Pulse Rate: 76 (10/12 0939)  Labs: Recent Labs    09/28/18 0413 09/28/18 1413 09/29/18 0542 09/29/18 1337 09/30/18 0431  HGB 9.9*  --  9.7*  --  9.9*  HCT 33.0*  --  30.3*  --  31.7*  PLT 467*  --  460*  --  494*  HEPARINUNFRC 0.25* 0.35  --  0.38 0.30  CREATININE 0.60  --   --   --   --     Estimated Creatinine Clearance: 46.1 mL/min (by C-G formula based on SCr of 0.6 mg/dL).  Assessment: 77 yr old female continues on IV heparin for CAD. History of CAD s/p unsuccessful attempt at PCI in Oct '15 managed medically who presented on 10/8 with CP. S/p cardiac cath on 10/10.  TCTS consulted for consideration for CABG vs atherectomy.  Heparin level is therapeutic:0.30, no infusion issues reported; no overt bruising or bleeding documented, will  increase rate ~ 1unit/kg/hr to prevent level from becoming subtherapeutic Plavix held for possible surgery, last dose 10/9. Not currently on Aspirin.  Hx swelling/rash noted in allergies.  Goal of Therapy:  Heparin level 0.3-0.7 units/ml Monitor platelets by anticoagulation protocol: Yes   Plan:  Increase heparin drip to 1100 units/hr Daily heparin level and CBC while on heparin. Follow up plans.  Georga Bora, PharmD Clinical Pharmacist 09/30/2018 12:26 PM Please check AMION for all [place] numbers

## 2018-10-01 ENCOUNTER — Other Ambulatory Visit: Payer: Self-pay

## 2018-10-01 LAB — CBC
HCT: 30.5 % — ABNORMAL LOW (ref 36.0–46.0)
HEMOGLOBIN: 9.6 g/dL — AB (ref 12.0–15.0)
MCH: 27 pg (ref 26.0–34.0)
MCHC: 31.5 g/dL (ref 30.0–36.0)
MCV: 85.9 fL (ref 80.0–100.0)
PLATELETS: 466 10*3/uL — AB (ref 150–400)
RBC: 3.55 MIL/uL — AB (ref 3.87–5.11)
RDW: 17.6 % — ABNORMAL HIGH (ref 11.5–15.5)
WBC: 8.4 10*3/uL (ref 4.0–10.5)
nRBC: 0 % (ref 0.0–0.2)

## 2018-10-01 LAB — HEPARIN LEVEL (UNFRACTIONATED): HEPARIN UNFRACTIONATED: 0.37 [IU]/mL (ref 0.30–0.70)

## 2018-10-01 MED ORDER — MAGNESIUM HYDROXIDE 400 MG/5ML PO SUSP
30.0000 mL | Freq: Every day | ORAL | Status: DC | PRN
  Administered 2018-10-01: 30 mL via ORAL
  Filled 2018-10-01: qty 30

## 2018-10-01 NOTE — Progress Notes (Signed)
ANTICOAGULATION CONSULT NOTE - Follow Up Consult  Pharmacy Consult for Heparin Indication: chest pain/ACS, CAD  Allergies  Allergen Reactions  . Fish-Derived Products Shortness Of Breath, Swelling and Rash  . Penicillins Shortness Of Breath, Swelling and Rash    PATIENT HAS HAD A PCN REACTION WITH IMMEDIATE RASH, FACIAL/TONGUE/THROAT SWELLING, SOB, OR LIGHTHEADEDNESS WITH HYPOTENSION:  #  #  #  YES  #  #  #   Has patient had a PCN reaction causing severe rash involving mucus membranes or skin necrosis: No PATIENT HAS HAD A PCN REACTION THAT REQUIRED HOSPITALIZATION:  #  #  #  YES  #  #  #   Has patient had a PCN reaction occurring within the last 10 years: No    . Aspirin Swelling and Rash    SWELLING REACTION UNSPECIFIED   . Brilinta [Ticagrelor] Rash    Started Brilinta 09/2014 - rash - unsure which it is directly related to  . Codeine Phosphate Nausea Only  . Diphenhydramine Hcl Rash    Patient Measurements: Height: 4\' 11"  (149.9 cm) Weight: 125 lb 12.8 oz (57.1 kg) IBW/kg (Calculated) : 43.2 Heparin Dosing Weight: 55 kg  Vital Signs: Temp: 98.3 F (36.8 C) (10/13 0434) Temp Source: Oral (10/13 0434) BP: 106/67 (10/13 0434) Pulse Rate: 74 (10/13 0434)  Labs: Recent Labs    09/29/18 0542 09/29/18 1337 09/30/18 0431 10/01/18 0348  HGB 9.7*  --  9.9* 9.6*  HCT 30.3*  --  31.7* 30.5*  PLT 460*  --  494* 466*  HEPARINUNFRC  --  0.38 0.30 0.37    Estimated Creatinine Clearance: 46.1 mL/min (by C-G formula based on SCr of 0.6 mg/dL).  Assessment: 77 yr old female continues on IV heparin for CAD. History of CAD s/p unsuccessful attempt at PCI in Oct '15 managed medically who presented on 10/8 with CP. S/p cardiac cath on 10/10.  TCTS consulted for consideration for CABG vs atherectomy.  On Heparin for ACS/CP. No AC or recent surgeries. Last heparin level therapeutic at 0.37. Hgb 9.6, plts 466.  Goal of Therapy:  Heparin level 0.3-0.7 units/ml Monitor platelets by  anticoagulation protocol: Yes   Plan:  Continue heparin drip at 1,100 units/hr Daily heparin level and CBC while on heparin. Follow up plans  Elenor Quinones, PharmD, BCPS Clinical Pharmacist Phone number [phone] 10/01/2018 9:50 AM

## 2018-10-01 NOTE — Progress Notes (Signed)
Pt reports 3/10 upper back pain and right shoulder blade pain similar to pain on admission.  IV heparin infusing.  BP 121/69.  HR 67.  1 SL NTG to patient per orders.  Will obtain EKG.

## 2018-10-01 NOTE — Progress Notes (Signed)
Progress Note  Patient Name: Cassie Campbell Date of Encounter: 10/01/2018  Primary Cardiologist: Shelva Majestic, MD   Subjective   C/o back pain overnight-  EKG personally reviewed, shows NSR with RBBB, no ischemic changes. Plan for CABG Wednesday per Dr. Roxan Hockey.  Inpatient Medications    Scheduled Meds: . atorvastatin  80 mg Oral q1800  . bisoprolol  5 mg Oral Daily  . ezetimibe  10 mg Oral Daily  . famotidine  20 mg Oral QHS  . mometasone-formoterol  2 puff Inhalation BID  . pantoprazole  40 mg Oral Daily  . ranolazine  1,000 mg Oral BID  . sodium chloride flush  3 mL Intravenous Q12H   Continuous Infusions: . sodium chloride 10 mL/hr at 09/29/18 0606  . heparin 1,100 Units/hr (10/01/18 0145)   PRN Meds: sodium chloride, acetaminophen, ALPRAZolam, nitroGLYCERIN, ondansetron (ZOFRAN) IV, sodium chloride flush, sodium chloride flush, zolpidem   Vital Signs    Vitals:   09/30/18 2351 10/01/18 0150 10/01/18 0434 10/01/18 0841  BP: 127/73 121/69 106/67   Pulse: 69  74   Resp: 20  20   Temp: 98.2 F (36.8 C)  98.3 F (36.8 C)   TempSrc: Oral  Oral   SpO2: 94%  95% 93%  Weight:   57.1 kg   Height:        Intake/Output Summary (Last 24 hours) at 10/01/2018 0946 Last data filed at 10/01/2018 0145 Gross per 24 hour  Intake 688 ml  Output 1750 ml  Net -1062 ml   Filed Weights   09/28/18 0532 09/29/18 0437 10/01/18 0434  Weight: 56.6 kg 57.2 kg 57.1 kg   Physical Exam   General appearance: alert and no distress Neck: no carotid bruit, no JVD and thyroid not enlarged, symmetric, no tenderness/mass/nodules Lungs: clear to auscultation bilaterally Heart: regular rate and rhythm, S1, S2 normal, no murmur, click, rub or gallop Abdomen: soft, non-tender; bowel sounds normal; no masses,  no organomegaly Extremities: extremities normal, atraumatic, no cyanosis or edema Pulses: 2+ and symmetric Skin: Skin color, texture, turgor normal. No rashes or  lesions Neurologic: Grossly normal Psych: Pleasant   Labs    Chemistry Recent Labs  Lab 09/26/18 1456 09/28/18 0413  NA 136 138  K 4.0 3.7  CL 101 106  CO2 25 24  GLUCOSE 113* 85  BUN 11 5*  CREATININE 0.83 0.60  CALCIUM 9.7 9.0  PROT 9.0*  --   ALBUMIN 3.4*  --   AST 32  --   ALT 20  --   ALKPHOS 93  --   BILITOT 0.6  --   GFRNONAA >60 >60  GFRAA >60 >60  ANIONGAP 10 8     Hematology Recent Labs  Lab 09/29/18 0542 09/30/18 0431 10/01/18 0348  WBC 8.5 9.2 8.4  RBC 3.53* 3.68* 3.55*  HGB 9.7* 9.9* 9.6*  HCT 30.3* 31.7* 30.5*  MCV 85.8 86.1 85.9  MCH 27.5 26.9 27.0  MCHC 32.0 31.2 31.5  RDW 17.6* 17.7* 17.6*  PLT 460* 494* 466*    Cardiac Enzymes Recent Labs  Lab 09/26/18 1456 09/26/18 2059 09/27/18 0349  TROPONINI <0.03 <0.03 <0.03    Recent Labs  Lab 09/26/18 1507  TROPIPOC 0.00     BNPNo results for input(s): BNP, PROBNP in the last 168 hours.   DDimer No results for input(s): DDIMER in the last 168 hours.   Radiology    No results found.   Telemetry    Sinus rhythm -  Personally Reviewed  ECG    Sinus with RBBB - personally reviewed  Cardiac Studies   Diagnostic LHC 10/16/14 Coronary angiography: Coronary dominance:right    Left Main: mildly calcified with no significant obstructive disease.  Left Anterior Descending (LAD): Heavily calcified throughout its course. There is diffuse 20% disease proximally and 40-50% disease in the midsegment. There is 70% stenosis in multiple areas distally all the way close to the apex  1st diagonal (D1): Small in size with minor irregularities.  2nd diagonal (D2): Small in size with minor irregularities.  3rd diagonal (D3): Small in size with minor irregularities.  Circumflex (LCx): Normal in size and nondominant. The vessel is moderately calcified with 20-30% stenosis in the midsegment.  1st obtuse marginal: Small in size with 95% proximal stenosis.  2nd obtuse marginal:  Large in size with minor irregularities.  3rd obtuse marginal: Normal in size with minor irregularities.  AV groove continuation segment: Medium in size with minor irregularities.  Right Coronary Artery: Normal in size and dominant. The vessel is heavily calcified especially in the mid and distal segment. There is 20% proximal stenosis. There is 95% heavily calcified stenosis in the midsegment. There is 99% discrete stenosis in the mid to distal segment with TIMI 1 flow throughout the vessel. The distal vessel into the PDA is underfilling and could not be evaluated although there might be some ostial PDA disease.  PCI Data: Vessel - RCA/Segment - mid Percent Stenosis (pre) 95% TIMI-flow 1 balloon angioplasty only (not successful) Percent Stenosis (post) 90 TIMI-flow (post) 3   Final Conclusions:  1. Significant 2 vessel coronary artery disease. The distal LAD is diffusely diseased and not suitable for revascularization. The culprit for non-ST elevation myocardial infarction is likely the right coronary artery which is heavily calcified. 2. Normal LV systolic function by echo. Mildly elevated left ventricular end-diastolic pressure. 3. Unsuccessful RCA PCI due to inability to dilate the lesion as the result of heavy calcifications.   Failed RCA PCI Attempt 10/17/14  IMPRESSION:  Extremely difficult percutaneous coronary intervention with initial plans for high-speed rotational atherectomy to the RCA which ultimately was never able to be performed, and aborted PTCA attempt of the extremely calcified subtotal RCA at the acute margina and multiple tandem distal stenoses  Patient Profile     77 y.o. female with a history of complex CAD, s/p unsuccessful attempt at PCI Oct 2015 (medically managed), ASA allergy, HTN, chronic RBBB and asthma, directly admitted from our office 09/26/18 for active CP c/w unstable angina.   Assessment & Plan    1. CAD with unstable  angina: -cardiac cath on 09/27/18: severe coronary calcification with recommendations for surgical consultation for consideration of CABG revascularization surgery. Per cath note, if patient is turned down surgically, consider atherectomy of proximal to mid LAD but there is diffuse distal LAD disease.   -Medical regimen: on bisoprolol, maximized ranolazine to 1000 mg twice a day.   -Plavix on hold -Continue IV Hep gtt per pharmacy for now>>no s/s of active bleeding  -Aggressive lipid-lowering therapy with target LDL less than 70>>Lipitor increased to 80mg  daily, along with Zetia   -Per the patient- she was told plan per Dr. Roxan Hockey was for CABG next Wednesday  - Chest to back pain overnight - improved with SL nitro, inhaler. Monitor.  Inactive issues: -hypertension: well controlled -anemia: stable H/H at 9/32. -asthma -aspirin allergy: on clopidogrel prior to admission  Pixie Casino, MD, [facility], [place]  Director of the [place] of the American Board of Clinical Lipidology Attending Cardiologist  Direct Dial: [phone]  Fax: [phone]  Website:  [email or web address]

## 2018-10-02 ENCOUNTER — Inpatient Hospital Stay (HOSPITAL_COMMUNITY): Payer: Medicare Other

## 2018-10-02 DIAGNOSIS — E785 Hyperlipidemia, unspecified: Secondary | ICD-10-CM

## 2018-10-02 DIAGNOSIS — I10 Essential (primary) hypertension: Secondary | ICD-10-CM

## 2018-10-02 DIAGNOSIS — I4510 Unspecified right bundle-branch block: Secondary | ICD-10-CM

## 2018-10-02 LAB — BASIC METABOLIC PANEL
Anion gap: 8 (ref 5–15)
BUN: 6 mg/dL — AB (ref 8–23)
CHLORIDE: 102 mmol/L (ref 98–111)
CO2: 28 mmol/L (ref 22–32)
Calcium: 9 mg/dL (ref 8.9–10.3)
Creatinine, Ser: 0.62 mg/dL (ref 0.44–1.00)
GFR calc Af Amer: >60 mL/min (ref >60)
GFR calc non Af Amer: >60 mL/min (ref >60)
Glucose, Bld: 93 mg/dL (ref 70–99)
Potassium: 4 mmol/L (ref 3.5–5.1)
Sodium: 138 mmol/L (ref 135–145)

## 2018-10-02 LAB — CBC
HEMATOCRIT: 31.7 % — AB (ref 36.0–46.0)
Hemoglobin: 9.8 g/dL — ABNORMAL LOW (ref 12.0–15.0)
MCH: 27.1 pg (ref 26.0–34.0)
MCHC: 30.9 g/dL (ref 30.0–36.0)
MCV: 87.8 fL (ref 80.0–100.0)
Platelets: 460 10*3/uL — ABNORMAL HIGH (ref 150–400)
RBC: 3.61 MIL/uL — ABNORMAL LOW (ref 3.87–5.11)
RDW: 17.7 % — ABNORMAL HIGH (ref 11.5–15.5)
WBC: 8.4 10*3/uL (ref 4.0–10.5)
nRBC: 0 % (ref 0.0–0.2)

## 2018-10-02 LAB — PULMONARY FUNCTION TEST
FEF 25-75 PRE: 0.52 L/s
FEF2575-%Pred-Pre: 46 %
FEV1-%Pred-Pre: 76 %
FEV1-PRE: 0.95 L
FEV1FVC-%Pred-Pre: 79 %
FEV6-%PRED-PRE: 100 %
FEV6-Pre: 1.55 L
FEV6FVC-%Pred-Pre: 105 %
FVC-%PRED-PRE: 96 %
FVC-Pre: 1.57 L
PRE FEV1/FVC RATIO: 60 %
Pre FEV6/FVC Ratio: 99 %

## 2018-10-02 LAB — HEPARIN LEVEL (UNFRACTIONATED): Heparin Unfractionated: 0.39 IU/mL (ref 0.30–0.70)

## 2018-10-02 NOTE — Progress Notes (Signed)
Discussed sternal precautions, IS (1250 mL), mobility, and d/c planning with pt. She sts she can stay with her neighbor at d/c (has done this previously). Good reception. Gave her [facility] booklet and careguide. She will practice IS but to notify RN if it causes angina. Will hold ambulation. She has been up in room.  [place], ACSM 9:51 AM 10/02/2018

## 2018-10-02 NOTE — Progress Notes (Signed)
ANTICOAGULATION CONSULT NOTE - Follow Up Consult  Pharmacy Consult for Heparin Indication: chest pain/ACS, CAD  Allergies  Allergen Reactions  . Fish-Derived Products Shortness Of Breath, Swelling and Rash  . Penicillins Shortness Of Breath, Swelling and Rash    PATIENT HAS HAD A PCN REACTION WITH IMMEDIATE RASH, FACIAL/TONGUE/THROAT SWELLING, SOB, OR LIGHTHEADEDNESS WITH HYPOTENSION:  #  #  #  YES  #  #  #   Has patient had a PCN reaction causing severe rash involving mucus membranes or skin necrosis: No PATIENT HAS HAD A PCN REACTION THAT REQUIRED HOSPITALIZATION:  #  #  #  YES  #  #  #   Has patient had a PCN reaction occurring within the last 10 years: No    . Aspirin Swelling and Rash    SWELLING REACTION UNSPECIFIED   . Brilinta [Ticagrelor] Rash    Started Brilinta 09/2014 - rash - unsure which it is directly related to  . Codeine Phosphate Nausea Only  . Diphenhydramine Hcl Rash    Patient Measurements: Height: 4\' 11"  (149.9 cm) Weight: 126 lb (57.2 kg) IBW/kg (Calculated) : 43.2 Heparin Dosing Weight: 55 kg  Vital Signs: Temp: 98.1 F (36.7 C) (10/14 0805) Temp Source: Oral (10/14 0805) BP: 114/74 (10/14 0805) Pulse Rate: 71 (10/14 0805)  Labs: Recent Labs    09/30/18 0431 10/01/18 0348 10/02/18 0332  HGB 9.9* 9.6* 9.8*  HCT 31.7* 30.5* 31.7*  PLT 494* 466* 460*  HEPARINUNFRC 0.30 0.37 0.39  CREATININE  --   --  0.62    Estimated Creatinine Clearance: 46.1 mL/min (by C-G formula based on SCr of 0.62 mg/dL).  Assessment: 77 yr old female continues on IV heparin for CAD. History of CAD s/p unsuccessful attempt at PCI in Oct '15 managed medically who presented on 10/8 with CP. S/p cardiac cath on 10/10.  TCTS consulted for consideration for CABG vs atherectomy.  On Heparin for ACS/CP. No AC or recent surgeries.  Heparin level remains therapeutic at 0.39. Hgb 9.8, plts 460.  Goal of Therapy:  Heparin level 0.3-0.7 units/ml Monitor platelets by  anticoagulation protocol: Yes   Plan:  Continue heparin drip at 1,100 units/hr Daily heparin level and CBC while on heparin. Follow up plans  Rober Minion, PharmD., MS Clinical Pharmacist Pager:  [phone] Thank you for allowing pharmacy to be part of this patients care team. 10/02/2018 8:37 AM

## 2018-10-02 NOTE — Plan of Care (Signed)
  Problem: Education: Goal: Knowledge of General Education information will improve Description Including pain rating scale, medication(s)/side effects and non-pharmacologic comfort measures Outcome: Completed/Met   Problem: Clinical Measurements: Goal: Respiratory complications will improve Outcome: Completed/Met

## 2018-10-02 NOTE — Progress Notes (Addendum)
Progress Note  Patient Name: Cassie Campbell Date of Encounter: 10/02/2018  Primary Cardiologist: Shelva Majestic, MD   Subjective   No chest pain, no dyspnea, no complaints.  Inpatient Medications    Scheduled Meds: . atorvastatin  80 mg Oral q1800  . bisoprolol  5 mg Oral Daily  . ezetimibe  10 mg Oral Daily  . famotidine  20 mg Oral QHS  . mometasone-formoterol  2 puff Inhalation BID  . pantoprazole  40 mg Oral Daily  . ranolazine  1,000 mg Oral BID  . sodium chloride flush  3 mL Intravenous Q12H   Continuous Infusions: . sodium chloride 10 mL/hr at 09/29/18 0606  . heparin 1,100 Units/hr (10/02/18 0101)   PRN Meds: sodium chloride, acetaminophen, ALPRAZolam, magnesium hydroxide, nitroGLYCERIN, ondansetron (ZOFRAN) IV, sodium chloride flush, sodium chloride flush, zolpidem   Vital Signs    Vitals:   10/02/18 0005 10/02/18 0526 10/02/18 0805 10/02/18 0806  BP: 115/73 111/74 114/74   Pulse: 72 75 71   Resp:  17 18   Temp: 98.2 F (36.8 C) 98 F (36.7 C) 98.1 F (36.7 C)   TempSrc: Oral Oral Oral   SpO2: 97% 95% 97% 98%  Weight:  57.2 kg    Height:        Intake/Output Summary (Last 24 hours) at 10/02/2018 1147 Last data filed at 10/01/2018 2100 Gross per 24 hour  Intake 790.5 ml  Output 1600 ml  Net -809.5 ml   Filed Weights   09/29/18 0437 10/01/18 0434 10/02/18 0526  Weight: 57.2 kg 57.1 kg 57.2 kg    Telemetry    SR RBBB - Personally Reviewed  ECG    No new - Personally Reviewed  Physical Exam   GEN: No acute distress.   Neck: No JVD Cardiac: RRR, no murmurs, rubs, or gallops.  Respiratory: Clear to auscultation bilaterally except for occ wheeze.Marland Kitchen GI: Soft, nontender, non-distended  MS: No edema; No deformity. Neuro:  Nonfocal  Psych: Normal affect   Labs    Chemistry Recent Labs  Lab 09/26/18 1456 09/28/18 0413 10/02/18 0332  NA 136 138 138  K 4.0 3.7 4.0  CL 101 106 102  CO2 25 24 28   GLUCOSE 113* 85 93  BUN 11 5* 6*    CREATININE 0.83 0.60 0.62  CALCIUM 9.7 9.0 9.0  PROT 9.0*  --   --   ALBUMIN 3.4*  --   --   AST 32  --   --   ALT 20  --   --   ALKPHOS 93  --   --   BILITOT 0.6  --   --   GFRNONAA >60 >60 >60  GFRAA >60 >60 >60  ANIONGAP 10 8 8      Hematology Recent Labs  Lab 09/30/18 0431 10/01/18 0348 10/02/18 0332  WBC 9.2 8.4 8.4  RBC 3.68* 3.55* 3.61*  HGB 9.9* 9.6* 9.8*  HCT 31.7* 30.5* 31.7*  MCV 86.1 85.9 87.8  MCH 26.9 27.0 27.1  MCHC 31.2 31.5 30.9  RDW 17.7* 17.6* 17.7*  PLT 494* 466* 460*    Cardiac Enzymes Recent Labs  Lab 09/26/18 1456 09/26/18 2059 09/27/18 0349  TROPONINI <0.03 <0.03 <0.03    Recent Labs  Lab 09/26/18 1507  TROPIPOC 0.00     BNPNo results for input(s): BNP, PROBNP in the last 168 hours.   DDimer No results for input(s): DDIMER in the last 168 hours.   Radiology    No results found.  Cardiac Studies   Cardiac cath 09/27/18  Acute Mrg lesion is 95% stenosed.  Mid RCA to Dist RCA lesion is 100% stenosed.  Prox RCA to Mid RCA lesion is 50% stenosed.  Mid RCA lesion is 80% stenosed.  Mid LM to Dist LM lesion is 25% stenosed.  Ost LAD lesion is 40% stenosed.  Prox LAD-1 lesion is 95% stenosed.  Prox LAD-2 lesion is 50% stenosed.  Mid LAD-1 lesion is 90% stenosed.  Mid LAD-2 lesion is 80% stenosed.  Dist LAD lesion is 80% stenosed.  The left ventricular ejection fraction is 50-55% by visual estimate.  LV end diastolic pressure is low.   Severe coronary calcification involving the LAD, circumflex, and RCA.  There is smooth 20 to 25% narrowing in the distal left main.  The LAD has 40% proximal calcified stenosis.  After the first septal perforating artery and be in the region of the first diagonal vessel the LAD is severely calcified with 95% stenosis.  There is diffuse disease in the mid segment with 50% narrowing followed by 90% stenosis with distal 80% stenoses.  Calcified left circumflex vessel without  high-grade obstructive disease.  Calcified RCA with 50% proximal 80% mid and total occlusion prior to the acute margin.  There is extensive left-to-right collateralization to the distal RCA via the left circulation.  Low normal global LV function with an ejection fraction at 50 to 55%.  There is small focal region of mild mid anterolateral posterior basal inferior hypo-contractility.  LVEDP  8 mm Hg.  RECOMMENDATION: Patient has severe coronary calcification.  Will review with colleagues.  Recommend surgical consultation for consideration of CABG revascularization surgery.  If patient is turned down surgically, consider atherectomy of proximal to mid LAD but there is diffuse distal LAD disease.  Will increase medical regimen and maximize ranolazine to 1000 mg twice a day.  Will hold Plavix but if turned down for surgery resume DAPT. Aggressive lipid-lowering therapy with target LDL less than 70.  CONSULT note Dr. Roxan Hockey  77 yo woman with known CAD who presented with unstable coronary syndrome, but ruled out for MI. Cath shows severe 2 vessel CAD with preserved LV function.   This is a difficult situation with no definitive best option given the poor quality of her target vessels. CABG can be done, but intermediate to long term results are likely to be poor due to severity of disease. There is a significant chance CABG will be ineffective in relieving angina even in the short term.  She has had vein stripping. I have ordered vein mapping to see if there is any usable conduit in the legs.  Patient Profile     77 y.o. female with a history of complex CAD, s/p unsuccessful attempt at PCI Oct 2015(medically managed), ASA allergy, HTN, chronic RBBB and asthma, directly admitted from our office 09/26/18 for active CP c/w unstable angina.   Assessment & Plan    CAD with unstable angina Dr. Roxan Hockey is working up for possible CABG-  PFTs ordered, vein mapping is ordered.   plavix on  hold for washout, on IV heparin--on ranexa, 1000mg  BID, bisoprolol   Negative MI  HTN controlled,   ASA allergy   Anemia Hgb at 9.8 was 10.8 on admit  HLD on statin.    For questions or updates, please contact [place] Please consult [email or web address] for contact info under        Signed, Cecilie Kicks, NP  10/02/2018, 11:47 AM    ----------------------------------------------------------------------------------------------  History and all data above reviewed.  Patient examined.  I agree with the findings as above.  ESSENCE MERLE is doing well today, chest pain free and no acute issues.   Constitutional: No acute distress ENMT: normal dentition, moist mucous membranes Cardiovascular: regular rhythm, normal rate, no murmurs. Radial pulses normal bilaterally. No jugular venous distention.  Respiratory: clear to auscultation bilaterally GI : normal bowel sounds, soft and nontender. No distention.   MSK: extremities warm, well perfused. No edema.  NEURO: grossly nonfocal exam, moves all extremities. PSYCH: alert and oriented x 3, normal mood and affect.   All available labs, radiology testing, previous records reviewed. Agree with documented assessment and plan of my colleague as stated above with the following additions or changes:  Principal Problem:   CAD (coronary artery disease) Active Problems:   Hyperlipidemia with target LDL less than 70   Essential hypertension   Right bundle branch block   Unstable angina ([place])   Plan: We will continue to monitor Mrs. Mel Almond and await decision on CABG from Dr. Leonarda Salon team, after pre-CABG workup is complete. No other changes to medical therapy.   Elouise Munroe, MD [facility] 2:19 PM  10/02/2018

## 2018-10-03 ENCOUNTER — Encounter (HOSPITAL_COMMUNITY): Payer: Self-pay | Admitting: Anesthesiology

## 2018-10-03 LAB — SURGICAL PCR SCREEN
MRSA, PCR: NEGATIVE
Staphylococcus aureus: NEGATIVE

## 2018-10-03 LAB — URINALYSIS, ROUTINE W REFLEX MICROSCOPIC
Bilirubin Urine: NEGATIVE
GLUCOSE, UA: NEGATIVE mg/dL
Hgb urine dipstick: NEGATIVE
KETONES UR: NEGATIVE mg/dL
LEUKOCYTES UA: NEGATIVE
NITRITE: NEGATIVE
PH: 6 (ref 5.0–8.0)
Protein, ur: NEGATIVE mg/dL
Specific Gravity, Urine: 1.008 (ref 1.005–1.030)

## 2018-10-03 LAB — BLOOD GAS, ARTERIAL
ACID-BASE DEFICIT: 0.8 mmol/L (ref 0.0–2.0)
BICARBONATE: 23.1 mmol/L (ref 20.0–28.0)
DRAWN BY: 249101
FIO2: 21
O2 Saturation: 94.1 %
PATIENT TEMPERATURE: 98.6
PH ART: 7.418 (ref 7.350–7.450)
pCO2 arterial: 36.4 mmHg (ref 32.0–48.0)
pO2, Arterial: 76.9 mmHg — ABNORMAL LOW (ref 83.0–108.0)

## 2018-10-03 LAB — HEPARIN LEVEL (UNFRACTIONATED): HEPARIN UNFRACTIONATED: 0.45 [IU]/mL (ref 0.30–0.70)

## 2018-10-03 MED ORDER — NITROGLYCERIN IN D5W 200-5 MCG/ML-% IV SOLN
2.0000 ug/min | INTRAVENOUS | Status: DC
  Filled 2018-10-03: qty 250

## 2018-10-03 MED ORDER — LEVOFLOXACIN IN D5W 500 MG/100ML IV SOLN
500.0000 mg | INTRAVENOUS | Status: AC
  Administered 2018-10-04: 500 mg via INTRAVENOUS
  Filled 2018-10-03: qty 100

## 2018-10-03 MED ORDER — METOPROLOL TARTRATE 12.5 MG HALF TABLET
12.5000 mg | ORAL_TABLET | Freq: Once | ORAL | Status: AC
  Administered 2018-10-04: 12.5 mg via ORAL
  Filled 2018-10-03: qty 1

## 2018-10-03 MED ORDER — TRANEXAMIC ACID (OHS) BOLUS VIA INFUSION
15.0000 mg/kg | INTRAVENOUS | Status: AC
  Administered 2018-10-04: 847.5 mg via INTRAVENOUS
  Filled 2018-10-03: qty 848

## 2018-10-03 MED ORDER — BISACODYL 5 MG PO TBEC
5.0000 mg | DELAYED_RELEASE_TABLET | Freq: Once | ORAL | Status: AC
  Administered 2018-10-03: 5 mg via ORAL
  Filled 2018-10-03: qty 1

## 2018-10-03 MED ORDER — POTASSIUM CHLORIDE 2 MEQ/ML IV SOLN
80.0000 meq | INTRAVENOUS | Status: DC
  Filled 2018-10-03: qty 40

## 2018-10-03 MED ORDER — DIAZEPAM 2 MG PO TABS
2.0000 mg | ORAL_TABLET | Freq: Once | ORAL | Status: DC
  Filled 2018-10-03: qty 1

## 2018-10-03 MED ORDER — SODIUM CHLORIDE 0.9 % IV SOLN
INTRAVENOUS | Status: DC
  Filled 2018-10-03: qty 30

## 2018-10-03 MED ORDER — DOPAMINE-DEXTROSE 3.2-5 MG/ML-% IV SOLN
0.0000 ug/kg/min | INTRAVENOUS | Status: DC
  Filled 2018-10-03: qty 250

## 2018-10-03 MED ORDER — DEXMEDETOMIDINE HCL IN NACL 400 MCG/100ML IV SOLN
0.1000 ug/kg/h | INTRAVENOUS | Status: AC
  Administered 2018-10-04: .5 ug/kg/h via INTRAVENOUS
  Filled 2018-10-03: qty 100

## 2018-10-03 MED ORDER — TRANEXAMIC ACID 1000 MG/10ML IV SOLN
1.5000 mg/kg/h | INTRAVENOUS | Status: AC
  Administered 2018-10-04: 1.5 mg/kg/h via INTRAVENOUS
  Filled 2018-10-03: qty 25

## 2018-10-03 MED ORDER — PHENYLEPHRINE HCL-NACL 20-0.9 MG/250ML-% IV SOLN
30.0000 ug/min | INTRAVENOUS | Status: AC
  Administered 2018-10-04: 30 ug/min via INTRAVENOUS
  Filled 2018-10-03: qty 250

## 2018-10-03 MED ORDER — MAGNESIUM SULFATE 50 % IJ SOLN
40.0000 meq | INTRAMUSCULAR | Status: DC
  Filled 2018-10-03: qty 9.85

## 2018-10-03 MED ORDER — EPINEPHRINE PF 1 MG/ML IJ SOLN
0.0000 ug/min | INTRAVENOUS | Status: DC
  Filled 2018-10-03: qty 4

## 2018-10-03 MED ORDER — CHLORHEXIDINE GLUCONATE CLOTH 2 % EX PADS
6.0000 | MEDICATED_PAD | Freq: Once | CUTANEOUS | Status: AC
  Administered 2018-10-03: 6 via TOPICAL

## 2018-10-03 MED ORDER — VANCOMYCIN HCL 10 G IV SOLR
1250.0000 mg | INTRAVENOUS | Status: AC
  Administered 2018-10-04: 1250 mg via INTRAVENOUS
  Filled 2018-10-03: qty 1250

## 2018-10-03 MED ORDER — CHLORHEXIDINE GLUCONATE CLOTH 2 % EX PADS
6.0000 | MEDICATED_PAD | Freq: Once | CUTANEOUS | Status: AC
  Administered 2018-10-04: 6 via TOPICAL

## 2018-10-03 MED ORDER — INSULIN REGULAR(HUMAN) IN NACL 100-0.9 UT/100ML-% IV SOLN
INTRAVENOUS | Status: AC
  Administered 2018-10-04: 2.2 [IU]/h via INTRAVENOUS
  Filled 2018-10-03: qty 100

## 2018-10-03 MED ORDER — TRANEXAMIC ACID (OHS) PUMP PRIME SOLUTION
2.0000 mg/kg | INTRAVENOUS | Status: DC
  Filled 2018-10-03: qty 1.13

## 2018-10-03 MED ORDER — CHLORHEXIDINE GLUCONATE 0.12 % MT SOLN
15.0000 mL | Freq: Once | OROMUCOSAL | Status: AC
  Administered 2018-10-04: 15 mL via OROMUCOSAL
  Filled 2018-10-03: qty 15

## 2018-10-03 MED ORDER — NOREPINEPHRINE 4 MG/250ML-% IV SOLN
0.0000 ug/min | INTRAVENOUS | Status: DC
  Filled 2018-10-03: qty 250

## 2018-10-03 MED ORDER — MILRINONE LACTATE IN DEXTROSE 20-5 MG/100ML-% IV SOLN
0.3000 ug/kg/min | INTRAVENOUS | Status: DC
  Filled 2018-10-03: qty 100

## 2018-10-03 MED ORDER — TEMAZEPAM 15 MG PO CAPS: 15.0000 mg | ORAL_CAPSULE | Freq: Once | ORAL | Status: DC | PRN

## 2018-10-03 MED ORDER — PLASMA-LYTE 148 IV SOLN
INTRAVENOUS | Status: AC
  Administered 2018-10-04: 500 mL
  Filled 2018-10-03: qty 2.5

## 2018-10-03 NOTE — Progress Notes (Signed)
ANTICOAGULATION CONSULT NOTE - Follow Up Consult  Pharmacy Consult for Heparin Indication: chest pain/ACS, CAD  Allergies  Allergen Reactions  . Fish-Derived Products Shortness Of Breath, Swelling and Rash  . Penicillins Shortness Of Breath, Swelling and Rash    PATIENT HAS HAD A PCN REACTION WITH IMMEDIATE RASH, FACIAL/TONGUE/THROAT SWELLING, SOB, OR LIGHTHEADEDNESS WITH HYPOTENSION:  #  #  #  YES  #  #  #   Has patient had a PCN reaction causing severe rash involving mucus membranes or skin necrosis: No PATIENT HAS HAD A PCN REACTION THAT REQUIRED HOSPITALIZATION:  #  #  #  YES  #  #  #   Has patient had a PCN reaction occurring within the last 10 years: No    . Aspirin Swelling and Rash    SWELLING REACTION UNSPECIFIED   . Brilinta [Ticagrelor] Rash    Started Brilinta 09/2014 - rash - unsure which it is directly related to  . Codeine Phosphate Nausea Only  . Diphenhydramine Hcl Rash    Patient Measurements: Height: 4\' 11"  (149.9 cm) Weight: 124 lb 9.6 oz (56.5 kg) IBW/kg (Calculated) : 43.2 Heparin Dosing Weight: 55 kg  Vital Signs: Temp: 98.1 F (36.7 C) (10/15 0743) Temp Source: Oral (10/15 0743) BP: 124/74 (10/15 0743) Pulse Rate: 74 (10/15 0743)  Labs: Recent Labs    10/01/18 0348 10/02/18 0332 10/03/18 0503  HGB 9.6* 9.8*  --   HCT 30.5* 31.7*  --   PLT 466* 460*  --   HEPARINUNFRC 0.37 0.39 0.45  CREATININE  --  0.62  --     Estimated Creatinine Clearance: 45.8 mL/min (by C-G formula based on SCr of 0.62 mg/dL).  Assessment: 77 yr old female continues on IV heparin for CAD. History of CAD s/p unsuccessful attempt at PCI in Oct '15 managed medically who presented on 10/8 with CP. S/p cardiac cath on 10/10.  Noted plans for CABG 10/16.  Pt continues on Heparin for CAD. Heparin level remains therapeutic (0.45). No bleeding noted.  Goal of Therapy:  Heparin level 0.3-0.7 units/ml Monitor platelets by anticoagulation protocol: Yes   Plan:  Continue  heparin drip at 1100 units/hr Daily heparin level and CBC while on heparin.  Sherlon Handing, PharmD, BCPS Clinical pharmacist  **Pharmacist phone directory can now be found on [place] [facility] [email or web address] ([email or web address]).  Listed under [place]. Thank you for allowing pharmacy to be part of this patients care team. 10/03/2018 10:08 AM

## 2018-10-03 NOTE — Progress Notes (Signed)
Progress Note  Patient Name: Cassie Campbell Date of Encounter: 10/03/2018  Primary Cardiologist: Cassie Majestic, MD   Subjective   No chest pain, no dyspnea, no complaints. Planned for CABG tomorrow morning.  Inpatient Medications    Scheduled Meds: . atorvastatin  80 mg Oral q1800  . bisoprolol  5 mg Oral Daily  . ezetimibe  10 mg Oral Daily  . famotidine  20 mg Oral QHS  . mometasone-formoterol  2 puff Inhalation BID  . pantoprazole  40 mg Oral Daily  . ranolazine  1,000 mg Oral BID  . sodium chloride flush  3 mL Intravenous Q12H   Continuous Infusions: . sodium chloride 10 mL/hr at 09/29/18 0606  . heparin 1,100 Units/hr (10/03/18 0538)   PRN Meds: sodium chloride, acetaminophen, ALPRAZolam, magnesium hydroxide, nitroGLYCERIN, ondansetron (ZOFRAN) IV, sodium chloride flush, sodium chloride flush, zolpidem   Vital Signs    Vitals:   10/03/18 0022 10/03/18 0436 10/03/18 0743 10/03/18 0914  BP: 124/72 121/77 124/74   Pulse: 69 74 74   Resp: 14 16 16    Temp: 98.4 F (36.9 C) 98.2 F (36.8 C) 98.1 F (36.7 C)   TempSrc: Oral Oral Oral   SpO2:  97% 98% 98%  Weight:  56.5 kg    Height:        Intake/Output Summary (Last 24 hours) at 10/03/2018 0959 Last data filed at 10/03/2018 0938 Gross per 24 hour  Intake 1646.7 ml  Output 2600 ml  Net -953.3 ml   Filed Weights   10/01/18 0434 10/02/18 0526 10/03/18 0436  Weight: 57.1 kg 57.2 kg 56.5 kg    Telemetry    SR RBBB - Personally Reviewed  ECG    No new - Personally Reviewed  Physical Exam   Constitutional: No acute distress ENMT: moist mucous membranes Cardiovascular: regular rhythm, normal rate, no murmurs. Radial pulses normal bilaterally. No jugular venous distention.  Respiratory: clear to auscultation bilaterally  GI : normal bowel sounds, soft and nontender. No distention.   MSK: extremities warm, well perfused. No edema.  NEURO: grossly nonfocal exam, moves all extremities. PSYCH: alert  and oriented x 3, normal mood and affect.   Labs    Chemistry Recent Labs  Lab 09/26/18 1456 09/28/18 0413 10/02/18 0332  NA 136 138 138  K 4.0 3.7 4.0  CL 101 106 102  CO2 25 24 28   GLUCOSE 113* 85 93  BUN 11 5* 6*  CREATININE 0.83 0.60 0.62  CALCIUM 9.7 9.0 9.0  PROT 9.0*  --   --   ALBUMIN 3.4*  --   --   AST 32  --   --   ALT 20  --   --   ALKPHOS 93  --   --   BILITOT 0.6  --   --   GFRNONAA >60 >60 >60  GFRAA >60 >60 >60  ANIONGAP 10 8 8      Hematology Recent Labs  Lab 09/30/18 0431 10/01/18 0348 10/02/18 0332  WBC 9.2 8.4 8.4  RBC 3.68* 3.55* 3.61*  HGB 9.9* 9.6* 9.8*  HCT 31.7* 30.5* 31.7*  MCV 86.1 85.9 87.8  MCH 26.9 27.0 27.1  MCHC 31.2 31.5 30.9  RDW 17.7* 17.6* 17.7*  PLT 494* 466* 460*    Cardiac Enzymes Recent Labs  Lab 09/26/18 1456 09/26/18 2059 09/27/18 0349  TROPONINI <0.03 <0.03 <0.03    Recent Labs  Lab 09/26/18 1507  TROPIPOC 0.00     BNPNo results for input(s): BNP,  PROBNP in the last 168 hours.   DDimer No results for input(s): DDIMER in the last 168 hours.   Radiology    No results found.  Cardiac Studies   Cardiac cath 09/27/18  Acute Mrg lesion is 95% stenosed.  Mid RCA to Dist RCA lesion is 100% stenosed.  Prox RCA to Mid RCA lesion is 50% stenosed.  Mid RCA lesion is 80% stenosed.  Mid LM to Dist LM lesion is 25% stenosed.  Ost LAD lesion is 40% stenosed.  Prox LAD-1 lesion is 95% stenosed.  Prox LAD-2 lesion is 50% stenosed.  Mid LAD-1 lesion is 90% stenosed.  Mid LAD-2 lesion is 80% stenosed.  Dist LAD lesion is 80% stenosed.  The left ventricular ejection fraction is 50-55% by visual estimate.  LV end diastolic pressure is low.   Severe coronary calcification involving the LAD, circumflex, and RCA.  There is smooth 20 to 25% narrowing in the distal left main.  The LAD has 40% proximal calcified stenosis.  After the first septal perforating artery and be in the region of the first  diagonal vessel the LAD is severely calcified with 95% stenosis.  There is diffuse disease in the mid segment with 50% narrowing followed by 90% stenosis with distal 80% stenoses.  Calcified left circumflex vessel without high-grade obstructive disease.  Calcified RCA with 50% proximal 80% mid and total occlusion prior to the acute margin.  There is extensive left-to-right collateralization to the distal RCA via the left circulation.  Low normal global LV function with an ejection fraction at 50 to 55%.  There is small focal region of mild mid anterolateral posterior basal inferior hypo-contractility.  LVEDP  8 mm Hg.  RECOMMENDATION: Patient has severe coronary calcification.  Will review with colleagues.  Recommend surgical consultation for consideration of CABG revascularization surgery.  If patient is turned down surgically, consider atherectomy of proximal to mid LAD but there is diffuse distal LAD disease.  Will increase medical regimen and maximize ranolazine to 1000 mg twice a day.  Will hold Plavix but if turned down for surgery resume DAPT. Aggressive lipid-lowering therapy with target LDL less than 70.  CONSULT note Dr. Roxan Campbell  77 yo woman with known CAD who presented with unstable coronary syndrome, but ruled out for MI. Cath shows severe 2 vessel CAD with preserved LV function.   This is a difficult situation with no definitive best option given the poor quality of her target vessels. CABG can be done, but intermediate to long term results are likely to be poor due to severity of disease. There is a significant chance CABG will be ineffective in relieving angina even in the short term.  She has had vein stripping. I have ordered vein mapping to see if there is any usable conduit in the legs.  Patient Profile     77 y.o. female with a history of complex CAD, s/p unsuccessful attempt at PCI Oct 2015(medically managed), ASA allergy, HTN, chronic RBBB and asthma, directly  admitted from our office 09/26/18 for active CP c/w unstable angina.  Assessment & Plan  Principal Problem:   CAD (coronary artery disease) Active Problems:   Hyperlipidemia with target LDL less than 70   Essential hypertension   Right bundle branch block   Unstable angina (Cassie Campbell)  1. CAD with unstable angina Dr. Roxan Campbell has spoken with the patient and plans for CABG in the morning.  No current chest pain. On heparin IV infusion. Hemoglobin stable. Has aspirin listed as an  allergy with swelling and rash. On ranolazine for chest pain.   2. HTN  Well controlled currently, on bisoprolol 5 mg daily.  3. Anemia  Stable, on heparin IV without change in Hb.  4. HLD On atorvastatin 80mg  daily and ezetimibe 10 mg daily.  For questions or updates, please contact [place] Please consult [email or web address] for contact info under     Signed, Elouise Munroe, MD  10/03/2018, 9:59 AM

## 2018-10-03 NOTE — Progress Notes (Signed)
6 Days Post-Op Procedure(s) (LRB): LEFT HEART CATH AND CORONARY ANGIOGRAPHY (N/A) Subjective: No complaints this Am Wants to proceed with high risk CABG Objective: Vital signs in last 24 hours: Temp:  [98 F (36.7 C)-98.4 F (36.9 C)] 98 F (36.7 C) (10/15 1153) Pulse Rate:  [69-79] 71 (10/15 1153) Cardiac Rhythm: Normal sinus rhythm;Bundle branch block (10/15 0700) Resp:  [14-18] 16 (10/15 1153) BP: (112-158)/(72-88) 122/79 (10/15 1153) SpO2:  [95 %-98 %] 96 % (10/15 1153) Weight:  [56.5 kg] 56.5 kg (10/15 0436)  Hemodynamic parameters for last 24 hours:    Intake/Output from previous day: 10/14 0701 - 10/15 0700 In: 1406.7 [P.O.:900; I.V.:506.7] Out: 2600 [Urine:2600] Intake/Output this shift: Total I/O In: 240 [P.O.:240] Out: -   General appearance: alert, cooperative and no distress Neurologic: intact Heart: regular rate and rhythm Lungs: clear to auscultation bilaterally Abdomen: normal findings: soft, non-tender  Lab Results: Recent Labs    10/01/18 0348 10/02/18 0332  WBC 8.4 8.4  HGB 9.6* 9.8*  HCT 30.5* 31.7*  PLT 466* 460*   BMET:  Recent Labs    10/02/18 0332  NA 138  K 4.0  CL 102  CO2 28  GLUCOSE 93  BUN 6*  CREATININE 0.62  CALCIUM 9.0    PT/INR: No results for input(s): LABPROT, INR in the last 72 hours. ABG    Component Value Date/Time   TCO2 29 10/15/2014 0845   CBG (last 3)  No results for input(s): GLUCAP in the last 72 hours.  Assessment/Plan: S/P Procedure(s) (LRB): LEFT HEART CATH AND CORONARY ANGIOGRAPHY (N/A) -77 yo woman with severe 2 vessel CAD involving LAD and RCA. Some calcification in circumflex but hemodynamically significant stenosis. Presented with unstable chest pain.   Has had bilateral vein stripping but there does appear to be possible usable vein in left thigh on vein mapping.  She wishes to proceed with CABG understanding the increased risk for graft failure due to diffusely diseased target vessels and  the high risk nature of the procedure.   I have discussed the indications, risks, benefits and alternatives with her. She understands the risks include but are not limited to death, MI, stroke, DVT, PE, bleeding, possible need for transfusion, infection, early or late graft failure, respiratory or renal failure, gastrointestinal complications, cardiac arrhythmias, as well as the possibility of other unforeseeable complications.  She accepts the risks and wishes to proceed.  Plan high risk CABG tomorrow a.m.  We will plan to harvest vein from left thigh.    LOS: 5 days    [name] 10/03/2018

## 2018-10-03 NOTE — Care Management Note (Signed)
Case Management Note  Patient Details  Name: Cassie Campbell MRN: [id number] Date of Birth: February 10, 1941  Subjective/Objective: Pt presented for Chest Pain- post cath plan for CABG 10-04-18. PTA Independent from home alone. Patient has support of neighbor and her POA is her best friend. Pt states she will probably go to her neighbors post procedure.      Action/Plan: CM will continue to monitor for additional needs post procedure.   Expected Discharge Date:                  Expected Discharge Plan:  [place]  In-House Referral:  NA  Discharge planning Services  CM Consult  Post Acute Care Choice:    Choice offered to:     DME Arranged:    DME Agency:     HH Arranged:    HH Agency:     Status of Service:  In process, will continue to follow  If discussed at Long Length of Stay Meetings, dates discussed:    Additional Comments:  Bethena Roys, RN 10/03/2018, 10:30 AM

## 2018-10-03 NOTE — Anesthesia Preprocedure Evaluation (Addendum)
Anesthesia Evaluation  Patient identified by MRN, date of birth, ID band Patient awake    Reviewed: Allergy & Precautions, NPO status , Patient's Chart, lab work & pertinent test results  History of Anesthesia Complications (+) PONV  Airway Mallampati: II  TM Distance: >3 FB Neck ROM: Full    Dental  (+) Edentulous Upper, Dental Advisory Given   Pulmonary asthma ,    breath sounds clear to auscultation       Cardiovascular hypertension, Pt. on medications + CAD and + Past MI   Rhythm:Regular Rate:Normal     Neuro/Psych Depression  Neuromuscular disease    GI/Hepatic Neg liver ROS, GERD  Medicated,  Endo/Other    Renal/GU negative Renal ROS     Musculoskeletal   Abdominal Normal abdominal exam  (+)   Peds  Hematology   Anesthesia Other Findings - HLD  Reproductive/Obstetrics                            Lab Results  Component Value Date   WBC 8.4 10/02/2018   HGB 9.8 (L) 10/02/2018   HCT 31.7 (L) 10/02/2018   MCV 87.8 10/02/2018   PLT 460 (H) 10/02/2018   Lab Results  Component Value Date   CREATININE 0.62 10/02/2018   BUN 6 (L) 10/02/2018   NA 138 10/02/2018   K 4.0 10/02/2018   CL 102 10/02/2018   CO2 28 10/02/2018   Lab Results  Component Value Date   INR 1.06 09/26/2018   INR 1.12 10/16/2014   INR 1.06 10/15/2014   EKG: NSR, incomplete RBBB   Echo: - Left ventricle: The cavity size was normal. Systolic function was   normal. The estimated ejection fraction was in the range of 60%   to 65%. Wall motion was normal; there were no regional wall   motion abnormalities. There was an increased relative   contribution of atrial contraction to ventricular filling.   Doppler parameters are consistent with abnormal left ventricular   relaxation (grade 1 diastolic dysfunction). - Atrial septum: A patent foramen ovale cannot be excluded. - Pulmonary arteries: Systolic pressure  could not be accurately   estimated. - Recommendations: Agitated saline contrast study to rule out PFO.  Anesthesia Physical Anesthesia Plan  ASA: IV  Anesthesia Plan: General   Post-op Pain Management:    Induction: Intravenous  PONV Risk Score and Plan: Treatment may vary due to age or medical condition  Airway Management Planned: Oral ETT  Additional Equipment: Arterial line, CVP, PA Cath, TEE and Ultrasound Guidance Line Placement  Intra-op Plan:   Post-operative Plan: Post-operative intubation/ventilation  Informed Consent: I have reviewed the patients History and Physical, chart, labs and discussed the procedure including the risks, benefits and alternatives for the proposed anesthesia with the patient or authorized representative who has indicated his/her understanding and acceptance.   Dental advisory given  Plan Discussed with: CRNA  Anesthesia Plan Comments:        Anesthesia Quick Evaluation

## 2018-10-03 NOTE — Plan of Care (Signed)
  Problem: Coping: Goal: Level of anxiety will decrease Outcome: Progressing   Problem: Pain Managment: Goal: General experience of comfort will improve Outcome: Progressing   Problem: Skin Integrity: Goal: Risk for impaired skin integrity will decrease Outcome: Progressing   

## 2018-10-04 ENCOUNTER — Inpatient Hospital Stay (HOSPITAL_COMMUNITY): Payer: Medicare Other

## 2018-10-04 ENCOUNTER — Inpatient Hospital Stay (HOSPITAL_COMMUNITY): Payer: Medicare Other | Admitting: Certified Registered Nurse Anesthetist

## 2018-10-04 ENCOUNTER — Encounter (HOSPITAL_COMMUNITY)
Admission: EM | Disposition: A | Discharge status: Home Health | Payer: Self-pay | Source: Home / Self Care | Attending: Cardiology

## 2018-10-04 DIAGNOSIS — I2510 Atherosclerotic heart disease of native coronary artery without angina pectoris: Secondary | ICD-10-CM | POA: Diagnosis present

## 2018-10-04 DIAGNOSIS — Z951 Presence of aortocoronary bypass graft: Secondary | ICD-10-CM

## 2018-10-04 HISTORY — PX: CORONARY ARTERY BYPASS GRAFT: SHX141

## 2018-10-04 HISTORY — PX: TEE WITHOUT CARDIOVERSION: SHX5443

## 2018-10-04 LAB — POCT I-STAT, CHEM 8
BUN: 8 mg/dL (ref 8–23)
BUN: 7 mg/dL — ABNORMAL LOW (ref 8–23)
BUN: 8 mg/dL (ref 8–23)
BUN: 7 mg/dL — AB (ref 8–23)
BUN: 7 mg/dL — ABNORMAL LOW (ref 8–23)
BUN: 5 mg/dL — ABNORMAL LOW (ref 8–23)
CALCIUM ION: 1.02 mmol/L — AB (ref 1.15–1.40)
CALCIUM ION: 1.07 mmol/L — AB (ref 1.15–1.40)
CHLORIDE: 99 mmol/L (ref 98–111)
CHLORIDE: 103 mmol/L (ref 98–111)
CHLORIDE: 105 mmol/L (ref 98–111)
CREATININE: 0.4 mg/dL — AB (ref 0.44–1.00)
Calcium, Ion: 1.26 mmol/L (ref 1.15–1.40)
Calcium, Ion: 1.01 mmol/L — ABNORMAL LOW (ref 1.15–1.40)
Calcium, Ion: 1.25 mmol/L (ref 1.15–1.40)
Calcium, Ion: 1.17 mmol/L (ref 1.15–1.40)
Chloride: 103 mmol/L (ref 98–111)
Chloride: 104 mmol/L (ref 98–111)
Chloride: 107 mmol/L (ref 98–111)
Creatinine, Ser: 0.5 mg/dL (ref 0.44–1.00)
Creatinine, Ser: 0.6 mg/dL (ref 0.44–1.00)
Creatinine, Ser: 0.5 mg/dL (ref 0.44–1.00)
Creatinine, Ser: 0.5 mg/dL (ref 0.44–1.00)
Creatinine, Ser: 0.5 mg/dL (ref 0.44–1.00)
GLUCOSE: 76 mg/dL (ref 70–99)
GLUCOSE: 146 mg/dL — AB (ref 70–99)
Glucose, Bld: 168 mg/dL — ABNORMAL HIGH (ref 70–99)
Glucose, Bld: 92 mg/dL (ref 70–99)
Glucose, Bld: 113 mg/dL — ABNORMAL HIGH (ref 70–99)
Glucose, Bld: 125 mg/dL — ABNORMAL HIGH (ref 70–99)
HCT: 20 % — ABNORMAL LOW (ref 36.0–46.0)
HCT: 22 % — ABNORMAL LOW (ref 36.0–46.0)
HCT: 26 % — ABNORMAL LOW (ref 36.0–46.0)
HEMATOCRIT: 28 % — AB (ref 36.0–46.0)
HEMATOCRIT: 26 % — AB (ref 36.0–46.0)
HEMATOCRIT: 29 % — AB (ref 36.0–46.0)
HEMOGLOBIN: 8.8 g/dL — AB (ref 12.0–15.0)
Hemoglobin: 9.5 g/dL — ABNORMAL LOW (ref 12.0–15.0)
Hemoglobin: 6.8 g/dL — CL (ref 12.0–15.0)
Hemoglobin: 7.5 g/dL — ABNORMAL LOW (ref 12.0–15.0)
Hemoglobin: 8.8 g/dL — ABNORMAL LOW (ref 12.0–15.0)
Hemoglobin: 9.9 g/dL — ABNORMAL LOW (ref 12.0–15.0)
POTASSIUM: 3.6 mmol/L (ref 3.5–5.1)
POTASSIUM: 3.8 mmol/L (ref 3.5–5.1)
Potassium: 4.1 mmol/L (ref 3.5–5.1)
Potassium: 4.4 mmol/L (ref 3.5–5.1)
Potassium: 4.1 mmol/L (ref 3.5–5.1)
Potassium: 4.4 mmol/L (ref 3.5–5.1)
SODIUM: 138 mmol/L (ref 135–145)
SODIUM: 141 mmol/L (ref 135–145)
Sodium: 139 mmol/L (ref 135–145)
Sodium: 139 mmol/L (ref 135–145)
Sodium: 137 mmol/L (ref 135–145)
Sodium: 140 mmol/L (ref 135–145)
TCO2: 27 mmol/L (ref 22–32)
TCO2: 26 mmol/L (ref 22–32)
TCO2: 27 mmol/L (ref 22–32)
TCO2: 25 mmol/L (ref 22–32)
TCO2: 22 mmol/L (ref 22–32)
TCO2: 24 mmol/L (ref 22–32)

## 2018-10-04 LAB — POCT I-STAT 3, ART BLOOD GAS (G3+)
ACID-BASE DEFICIT: 4 mmol/L — AB (ref 0.0–2.0)
Acid-base deficit: 3 mmol/L — ABNORMAL HIGH (ref 0.0–2.0)
Acid-base deficit: 1 mmol/L (ref 0.0–2.0)
Acid-base deficit: 2 mmol/L (ref 0.0–2.0)
Acid-base deficit: 1 mmol/L (ref 0.0–2.0)
BICARBONATE: 25.1 mmol/L (ref 20.0–28.0)
BICARBONATE: 22.6 mmol/L (ref 20.0–28.0)
Bicarbonate: 24.3 mmol/L (ref 20.0–28.0)
Bicarbonate: 26.6 mmol/L (ref 20.0–28.0)
Bicarbonate: 26.4 mmol/L (ref 20.0–28.0)
Bicarbonate: 24.2 mmol/L (ref 20.0–28.0)
Bicarbonate: 25.4 mmol/L (ref 20.0–28.0)
O2 SAT: 99 %
O2 Saturation: 100 %
O2 Saturation: 97 %
O2 Saturation: 99 %
O2 Saturation: 99 %
O2 Saturation: 99 %
O2 Saturation: 97 %
PCO2 ART: 45.5 mmHg (ref 32.0–48.0)
PCO2 ART: 43.2 mmHg (ref 32.0–48.0)
PH ART: 7.35 (ref 7.350–7.450)
PH ART: 7.274 — AB (ref 7.350–7.450)
PH ART: 7.338 — AB (ref 7.350–7.450)
PH ART: 7.336 — AB (ref 7.350–7.450)
PO2 ART: 155 mmHg — AB (ref 83.0–108.0)
Patient temperature: 36.2
TCO2: 27 mmol/L (ref 22–32)
TCO2: 24 mmol/L (ref 22–32)
TCO2: 26 mmol/L (ref 22–32)
TCO2: 28 mmol/L (ref 22–32)
TCO2: 28 mmol/L (ref 22–32)
TCO2: 26 mmol/L (ref 22–32)
TCO2: 27 mmol/L (ref 22–32)
pCO2 arterial: 61.1 mmHg — ABNORMAL HIGH (ref 32.0–48.0)
pCO2 arterial: 57 mmHg — ABNORMAL HIGH (ref 32.0–48.0)
pCO2 arterial: 48.5 mmHg — ABNORMAL HIGH (ref 32.0–48.0)
pCO2 arterial: 44.7 mmHg (ref 32.0–48.0)
pCO2 arterial: 47.1 mmHg (ref 32.0–48.0)
pH, Arterial: 7.327 — ABNORMAL LOW (ref 7.350–7.450)
pH, Arterial: 7.2 — ABNORMAL LOW (ref 7.350–7.450)
pH, Arterial: 7.341 — ABNORMAL LOW (ref 7.350–7.450)
pO2, Arterial: 355 mmHg — ABNORMAL HIGH (ref 83.0–108.0)
pO2, Arterial: 130 mmHg — ABNORMAL HIGH (ref 83.0–108.0)
pO2, Arterial: 107 mmHg (ref 83.0–108.0)
pO2, Arterial: 130 mmHg — ABNORMAL HIGH (ref 83.0–108.0)
pO2, Arterial: 131 mmHg — ABNORMAL HIGH (ref 83.0–108.0)
pO2, Arterial: 100 mmHg (ref 83.0–108.0)

## 2018-10-04 LAB — GLUCOSE, CAPILLARY
GLUCOSE-CAPILLARY: 106 mg/dL — AB (ref 70–99)
GLUCOSE-CAPILLARY: 97 mg/dL (ref 70–99)
Glucose-Capillary: 66 mg/dL — ABNORMAL LOW (ref 70–99)
Glucose-Capillary: 157 mg/dL — ABNORMAL HIGH (ref 70–99)

## 2018-10-04 LAB — CBC
HCT: 29.6 % — ABNORMAL LOW (ref 36.0–46.0)
HCT: 31.1 % — ABNORMAL LOW (ref 36.0–46.0)
HCT: 30.9 % — ABNORMAL LOW (ref 36.0–46.0)
HEMOGLOBIN: 9.7 g/dL — AB (ref 12.0–15.0)
HEMOGLOBIN: 9.9 g/dL — AB (ref 12.0–15.0)
Hemoglobin: 9.9 g/dL — ABNORMAL LOW (ref 12.0–15.0)
MCH: 28.1 pg (ref 26.0–34.0)
MCH: 28 pg (ref 26.0–34.0)
MCH: 28.2 pg (ref 26.0–34.0)
MCHC: 32.8 g/dL (ref 30.0–36.0)
MCHC: 31.8 g/dL (ref 30.0–36.0)
MCHC: 32 g/dL (ref 30.0–36.0)
MCV: 85.8 fL (ref 80.0–100.0)
MCV: 88.1 fL (ref 80.0–100.0)
MCV: 88 fL (ref 80.0–100.0)
NRBC: 0 % (ref 0.0–0.2)
NRBC: 0 % (ref 0.0–0.2)
PLATELETS: 447 10*3/uL — AB (ref 150–400)
Platelets: 247 10*3/uL (ref 150–400)
Platelets: 228 10*3/uL (ref 150–400)
RBC: 3.45 MIL/uL — AB (ref 3.87–5.11)
RBC: 3.53 MIL/uL — AB (ref 3.87–5.11)
RBC: 3.51 MIL/uL — ABNORMAL LOW (ref 3.87–5.11)
RDW: 17.9 % — ABNORMAL HIGH (ref 11.5–15.5)
RDW: 17.4 % — ABNORMAL HIGH (ref 11.5–15.5)
RDW: 17.3 % — AB (ref 11.5–15.5)
WBC: 8.1 10*3/uL (ref 4.0–10.5)
WBC: 15.2 10*3/uL — AB (ref 4.0–10.5)
WBC: 11.9 10*3/uL — ABNORMAL HIGH (ref 4.0–10.5)
nRBC: 0 % (ref 0.0–0.2)

## 2018-10-04 LAB — POCT I-STAT 4, (NA,K, GLUC, HGB,HCT)
Glucose, Bld: 110 mg/dL — ABNORMAL HIGH (ref 70–99)
HEMATOCRIT: 28 % — AB (ref 36.0–46.0)
HEMOGLOBIN: 9.5 g/dL — AB (ref 12.0–15.0)
POTASSIUM: 3.8 mmol/L (ref 3.5–5.1)
Sodium: 143 mmol/L (ref 135–145)

## 2018-10-04 LAB — PLATELET COUNT: Platelets: 262 10*3/uL (ref 150–400)

## 2018-10-04 LAB — PROTIME-INR
INR: 1.41
PROTHROMBIN TIME: 17.1 s — AB (ref 11.4–15.2)

## 2018-10-04 LAB — CREATININE, SERUM
CREATININE: 0.61 mg/dL (ref 0.44–1.00)
GFR calc Af Amer: >60 mL/min (ref >60)

## 2018-10-04 LAB — HEPARIN LEVEL (UNFRACTIONATED): HEPARIN UNFRACTIONATED: 0.34 [IU]/mL (ref 0.30–0.70)

## 2018-10-04 LAB — HEMOGLOBIN A1C
HEMOGLOBIN A1C: 5.9 % — AB (ref 4.8–5.6)
MEAN PLASMA GLUCOSE: 122.63 mg/dL

## 2018-10-04 LAB — PREPARE RBC (CROSSMATCH)

## 2018-10-04 LAB — HEMOGLOBIN AND HEMATOCRIT, BLOOD
HEMATOCRIT: 22.6 % — AB (ref 36.0–46.0)
HEMOGLOBIN: 7.4 g/dL — AB (ref 12.0–15.0)

## 2018-10-04 LAB — MAGNESIUM: Magnesium: 2.9 mg/dL — ABNORMAL HIGH (ref 1.7–2.4)

## 2018-10-04 LAB — APTT: APTT: 28 s (ref 24–36)

## 2018-10-04 SURGERY — CORONARY ARTERY BYPASS GRAFTING (CABG)
Anesthesia: General | Site: Chest

## 2018-10-04 MED ORDER — PROPOFOL 10 MG/ML IV BOLUS
INTRAVENOUS | Status: DC | PRN
  Administered 2018-10-04: 90 mg via INTRAVENOUS

## 2018-10-04 MED ORDER — ADULT MULTIVITAMIN W/MINERALS CH
1.0000 | ORAL_TABLET | Freq: Every day | ORAL | Status: DC
  Administered 2018-10-05 – 2018-10-09 (×5): 1 via ORAL
  Filled 2018-10-04 (×5): qty 1

## 2018-10-04 MED ORDER — PHENYLEPHRINE 40 MCG/ML (10ML) SYRINGE FOR IV PUSH (FOR BLOOD PRESSURE SUPPORT)
PREFILLED_SYRINGE | INTRAVENOUS | Status: AC
  Filled 2018-10-04: qty 10

## 2018-10-04 MED ORDER — FENTANYL CITRATE (PF) 250 MCG/5ML IJ SOLN
INTRAMUSCULAR | Status: DC | PRN
  Administered 2018-10-04: 50 ug via INTRAVENOUS
  Administered 2018-10-04: 150 ug via INTRAVENOUS
  Administered 2018-10-04 (×2): 100 ug via INTRAVENOUS
  Administered 2018-10-04: 50 ug via INTRAVENOUS
  Administered 2018-10-04: 150 ug via INTRAVENOUS
  Administered 2018-10-04: 100 ug via INTRAVENOUS
  Administered 2018-10-04: 150 ug via INTRAVENOUS
  Administered 2018-10-04: 50 ug via INTRAVENOUS
  Administered 2018-10-04: 100 ug via INTRAVENOUS
  Administered 2018-10-04: 150 ug via INTRAVENOUS
  Administered 2018-10-04: 100 ug via INTRAVENOUS

## 2018-10-04 MED ORDER — NITROGLYCERIN IN D5W 200-5 MCG/ML-% IV SOLN: 0.0000 ug/min | INTRAVENOUS | Status: DC

## 2018-10-04 MED ORDER — ACETAMINOPHEN 160 MG/5ML PO SOLN: 1000.0000 mg | Freq: Four times a day (QID) | ORAL | Status: DC

## 2018-10-04 MED ORDER — DEXMEDETOMIDINE HCL IN NACL 200 MCG/50ML IV SOLN
0.0000 ug/kg/h | INTRAVENOUS | Status: DC
  Filled 2018-10-04: qty 50

## 2018-10-04 MED ORDER — MIDAZOLAM HCL 5 MG/5ML IJ SOLN
INTRAMUSCULAR | Status: DC | PRN
  Administered 2018-10-04: 2 mg via INTRAVENOUS
  Administered 2018-10-04 (×3): 1 mg via INTRAVENOUS

## 2018-10-04 MED ORDER — ALBUMIN HUMAN 5 % IV SOLN
250.0000 mL | INTRAVENOUS | Status: AC | PRN
  Administered 2018-10-04: 12.5 g via INTRAVENOUS

## 2018-10-04 MED ORDER — MORPHINE SULFATE (PF) 2 MG/ML IV SOLN: 1.0000 mg | INTRAVENOUS | Status: AC | PRN

## 2018-10-04 MED ORDER — EPHEDRINE SULFATE-NACL 50-0.9 MG/10ML-% IV SOSY
PREFILLED_SYRINGE | INTRAVENOUS | Status: DC | PRN
  Administered 2018-10-04 (×3): 10 mg via INTRAVENOUS

## 2018-10-04 MED ORDER — PHENYLEPHRINE HCL-NACL 20-0.9 MG/250ML-% IV SOLN
0.0000 ug/min | INTRAVENOUS | Status: DC
  Filled 2018-10-04: qty 250

## 2018-10-04 MED ORDER — INSULIN REGULAR BOLUS VIA INFUSION
0.0000 [IU] | Freq: Three times a day (TID) | INTRAVENOUS | Status: DC
  Filled 2018-10-04: qty 10

## 2018-10-04 MED ORDER — 0.9 % SODIUM CHLORIDE (POUR BTL) OPTIME
TOPICAL | Status: DC | PRN
  Administered 2018-10-04: 6000 mL

## 2018-10-04 MED ORDER — SODIUM CHLORIDE 0.9% FLUSH: 3.0000 mL | INTRAVENOUS | Status: DC | PRN

## 2018-10-04 MED ORDER — ORAL CARE MOUTH RINSE
15.0000 mL | Freq: Two times a day (BID) | OROMUCOSAL | Status: DC
  Administered 2018-10-04 – 2018-10-09 (×8): 15 mL via OROMUCOSAL

## 2018-10-04 MED ORDER — MIDAZOLAM HCL 10 MG/2ML IJ SOLN
INTRAMUSCULAR | Status: AC
  Filled 2018-10-04: qty 2

## 2018-10-04 MED ORDER — VASOPRESSIN 20 UNIT/ML IV SOLN
INTRAVENOUS | Status: AC
  Filled 2018-10-04: qty 1

## 2018-10-04 MED ORDER — EPHEDRINE 5 MG/ML INJ
INTRAVENOUS | Status: AC
  Filled 2018-10-04: qty 10

## 2018-10-04 MED ORDER — PANTOPRAZOLE SODIUM 40 MG PO TBEC
40.0000 mg | DELAYED_RELEASE_TABLET | Freq: Every day | ORAL | Status: DC
  Administered 2018-10-06: 40 mg via ORAL
  Filled 2018-10-04: qty 1

## 2018-10-04 MED ORDER — POTASSIUM CHLORIDE 10 MEQ/50ML IV SOLN
10.0000 meq | INTRAVENOUS | Status: AC
  Administered 2018-10-04 (×3): 10 meq via INTRAVENOUS

## 2018-10-04 MED ORDER — LACTATED RINGERS IV SOLN
INTRAVENOUS | Status: DC | PRN
  Administered 2018-10-04: 08:00:00 via INTRAVENOUS

## 2018-10-04 MED ORDER — SODIUM CHLORIDE 0.9 % IV SOLN
INTRAVENOUS | Status: DC
  Administered 2018-10-04: 15:00:00 via INTRAVENOUS

## 2018-10-04 MED ORDER — VANCOMYCIN HCL IN DEXTROSE 1-5 GM/200ML-% IV SOLN
1000.0000 mg | Freq: Once | INTRAVENOUS | Status: AC
  Administered 2018-10-04: 1000 mg via INTRAVENOUS
  Filled 2018-10-04: qty 200

## 2018-10-04 MED ORDER — ROCURONIUM BROMIDE 50 MG/5ML IV SOSY
PREFILLED_SYRINGE | INTRAVENOUS | Status: AC
  Filled 2018-10-04: qty 5

## 2018-10-04 MED ORDER — ONDANSETRON HCL 4 MG/2ML IJ SOLN
4.0000 mg | Freq: Four times a day (QID) | INTRAMUSCULAR | Status: DC | PRN
  Administered 2018-10-05 – 2018-10-06 (×4): 4 mg via INTRAVENOUS
  Filled 2018-10-04 (×4): qty 2

## 2018-10-04 MED ORDER — ACETAMINOPHEN 160 MG/5ML PO SOLN: 650.0000 mg | Freq: Once | ORAL | Status: AC

## 2018-10-04 MED ORDER — POTASSIUM CHLORIDE 10 MEQ/50ML IV SOLN
10.0000 meq | INTRAVENOUS | Status: DC | PRN
  Filled 2018-10-04: qty 50

## 2018-10-04 MED ORDER — SODIUM CHLORIDE 0.9% FLUSH
3.0000 mL | Freq: Two times a day (BID) | INTRAVENOUS | Status: DC
  Administered 2018-10-05 – 2018-10-06 (×2): 3 mL via INTRAVENOUS

## 2018-10-04 MED ORDER — ROCURONIUM BROMIDE 10 MG/ML (PF) SYRINGE
PREFILLED_SYRINGE | INTRAVENOUS | Status: DC | PRN
  Administered 2018-10-04 (×3): 50 mg via INTRAVENOUS

## 2018-10-04 MED ORDER — METOPROLOL TARTRATE 5 MG/5ML IV SOLN
2.5000 mg | INTRAVENOUS | Status: DC | PRN
  Administered 2018-10-04: 5 mg via INTRAVENOUS

## 2018-10-04 MED ORDER — DOCUSATE SODIUM 100 MG PO CAPS
200.0000 mg | ORAL_CAPSULE | Freq: Every day | ORAL | Status: DC
  Administered 2018-10-05 – 2018-10-07 (×3): 200 mg via ORAL
  Filled 2018-10-04 (×4): qty 2

## 2018-10-04 MED ORDER — METOPROLOL TARTRATE 25 MG/10 ML ORAL SUSPENSION: 12.5000 mg | Freq: Two times a day (BID) | ORAL | Status: DC

## 2018-10-04 MED ORDER — SODIUM CHLORIDE 0.9% FLUSH
10.0000 mL | Freq: Two times a day (BID) | INTRAVENOUS | Status: DC
  Administered 2018-10-05: 10 mL
  Administered 2018-10-05: 20 mL

## 2018-10-04 MED ORDER — DEXTROSE 50 % IV SOLN
14.0000 mL | Freq: Once | INTRAVENOUS | Status: AC
  Administered 2018-10-04: 14 mL via INTRAVENOUS

## 2018-10-04 MED ORDER — LEVOFLOXACIN IN D5W 750 MG/150ML IV SOLN
750.0000 mg | INTRAVENOUS | Status: AC
  Administered 2018-10-05: 750 mg via INTRAVENOUS
  Filled 2018-10-04: qty 150

## 2018-10-04 MED ORDER — EPINEPHRINE PF 1 MG/10ML IJ SOSY
PREFILLED_SYRINGE | INTRAMUSCULAR | Status: AC
  Filled 2018-10-04: qty 10

## 2018-10-04 MED ORDER — MONTELUKAST SODIUM 10 MG PO TABS
10.0000 mg | ORAL_TABLET | Freq: Every day | ORAL | Status: DC
  Administered 2018-10-05 – 2018-10-08 (×4): 10 mg via ORAL
  Filled 2018-10-04 (×4): qty 1

## 2018-10-04 MED ORDER — FAMOTIDINE IN NACL 20-0.9 MG/50ML-% IV SOLN
20.0000 mg | Freq: Two times a day (BID) | INTRAVENOUS | Status: AC
  Administered 2018-10-04 (×2): 20 mg via INTRAVENOUS
  Filled 2018-10-04: qty 50

## 2018-10-04 MED ORDER — TRAMADOL HCL 50 MG PO TABS
50.0000 mg | ORAL_TABLET | ORAL | Status: DC | PRN
  Administered 2018-10-05: 50 mg via ORAL
  Filled 2018-10-04: qty 1

## 2018-10-04 MED ORDER — CHLORHEXIDINE GLUCONATE 0.12 % MT SOLN
15.0000 mL | Freq: Two times a day (BID) | OROMUCOSAL | Status: DC
  Administered 2018-10-04 – 2018-10-09 (×7): 15 mL via OROMUCOSAL
  Filled 2018-10-04 (×6): qty 15

## 2018-10-04 MED ORDER — SODIUM CHLORIDE 0.45 % IV SOLN
INTRAVENOUS | Status: DC | PRN
  Administered 2018-10-04: 20 mL/h via INTRAVENOUS

## 2018-10-04 MED ORDER — DEXTROSE 50 % IV SOLN
INTRAVENOUS | Status: AC
  Filled 2018-10-04: qty 50

## 2018-10-04 MED ORDER — HEMOSTATIC AGENTS (NO CHARGE) OPTIME
TOPICAL | Status: DC | PRN
  Administered 2018-10-04 (×2): 1 via TOPICAL

## 2018-10-04 MED ORDER — LACTATED RINGERS IV SOLN
INTRAVENOUS | Status: DC
  Administered 2018-10-04: 15:00:00 via INTRAVENOUS

## 2018-10-04 MED ORDER — MORPHINE SULFATE (PF) 2 MG/ML IV SOLN
2.0000 mg | INTRAVENOUS | Status: DC | PRN
  Administered 2018-10-04 (×2): 2 mg via INTRAVENOUS
  Administered 2018-10-05: 4 mg via INTRAVENOUS
  Filled 2018-10-04: qty 2
  Filled 2018-10-04 (×2): qty 1

## 2018-10-04 MED ORDER — MAGNESIUM SULFATE 4 GM/100ML IV SOLN
4.0000 g | Freq: Once | INTRAVENOUS | Status: AC
  Administered 2018-10-04: 4 g via INTRAVENOUS
  Filled 2018-10-04: qty 100

## 2018-10-04 MED ORDER — SODIUM CHLORIDE 0.9% FLUSH: 10.0000 mL | INTRAVENOUS | Status: DC | PRN

## 2018-10-04 MED ORDER — PROTAMINE SULFATE 10 MG/ML IV SOLN
INTRAVENOUS | Status: DC | PRN
  Administered 2018-10-04: 30 mg via INTRAVENOUS
  Administered 2018-10-04: 150 mg via INTRAVENOUS

## 2018-10-04 MED ORDER — BISACODYL 10 MG RE SUPP: 10.0000 mg | Freq: Every day | RECTAL | Status: DC

## 2018-10-04 MED ORDER — LIDOCAINE 2% (20 MG/ML) 5 ML SYRINGE
INTRAMUSCULAR | Status: AC
  Filled 2018-10-04: qty 5

## 2018-10-04 MED ORDER — ACETAMINOPHEN 650 MG RE SUPP
650.0000 mg | Freq: Once | RECTAL | Status: AC
  Administered 2018-10-04: 650 mg via RECTAL

## 2018-10-04 MED ORDER — CHLORHEXIDINE GLUCONATE 0.12 % MT SOLN
15.0000 mL | OROMUCOSAL | Status: AC
  Administered 2018-10-04: 15 mL via OROMUCOSAL

## 2018-10-04 MED ORDER — SODIUM CHLORIDE 0.9 % IV SOLN: 250.0000 mL | INTRAVENOUS | Status: DC

## 2018-10-04 MED ORDER — METOPROLOL TARTRATE 12.5 MG HALF TABLET
12.5000 mg | ORAL_TABLET | Freq: Two times a day (BID) | ORAL | Status: DC
  Administered 2018-10-04: 12.5 mg via ORAL
  Filled 2018-10-04: qty 1

## 2018-10-04 MED ORDER — SODIUM BICARBONATE 8.4 % IV SOLN
50.0000 meq | Freq: Once | INTRAVENOUS | Status: AC
  Administered 2018-10-04: 50 meq via INTRAVENOUS

## 2018-10-04 MED ORDER — INSULIN REGULAR(HUMAN) IN NACL 100-0.9 UT/100ML-% IV SOLN: INTRAVENOUS | Status: DC

## 2018-10-04 MED ORDER — HEPARIN SODIUM (PORCINE) 1000 UNIT/ML IJ SOLN
INTRAMUSCULAR | Status: DC | PRN
  Administered 2018-10-04: 2000 [IU] via INTRAVENOUS
  Administered 2018-10-04: 18000 [IU] via INTRAVENOUS

## 2018-10-04 MED ORDER — BISACODYL 5 MG PO TBEC
10.0000 mg | DELAYED_RELEASE_TABLET | Freq: Every day | ORAL | Status: DC
  Administered 2018-10-05 – 2018-10-06 (×2): 10 mg via ORAL
  Filled 2018-10-04 (×2): qty 2

## 2018-10-04 MED ORDER — PROPOFOL 10 MG/ML IV BOLUS
INTRAVENOUS | Status: AC
  Filled 2018-10-04: qty 20

## 2018-10-04 MED ORDER — MIDAZOLAM HCL 2 MG/2ML IJ SOLN: 2.0000 mg | INTRAMUSCULAR | Status: DC | PRN

## 2018-10-04 MED ORDER — ALBUMIN HUMAN 5 % IV SOLN
INTRAVENOUS | Status: DC | PRN
  Administered 2018-10-04 (×2): via INTRAVENOUS

## 2018-10-04 MED ORDER — LACTATED RINGERS IV SOLN: INTRAVENOUS | Status: DC

## 2018-10-04 MED ORDER — ROCURONIUM BROMIDE 50 MG/5ML IV SOSY
PREFILLED_SYRINGE | INTRAVENOUS | Status: AC
  Filled 2018-10-04: qty 10

## 2018-10-04 MED ORDER — PHENYLEPHRINE 40 MCG/ML (10ML) SYRINGE FOR IV PUSH (FOR BLOOD PRESSURE SUPPORT)
PREFILLED_SYRINGE | INTRAVENOUS | Status: DC | PRN
  Administered 2018-10-04: 240 ug via INTRAVENOUS
  Administered 2018-10-04: 80 ug via INTRAVENOUS
  Administered 2018-10-04: 240 ug via INTRAVENOUS

## 2018-10-04 MED ORDER — LACTATED RINGERS IV SOLN: 500.0000 mL | Freq: Once | INTRAVENOUS | Status: DC | PRN

## 2018-10-04 MED ORDER — CHLORHEXIDINE GLUCONATE CLOTH 2 % EX PADS
6.0000 | MEDICATED_PAD | Freq: Every day | CUTANEOUS | Status: DC
  Administered 2018-10-04 – 2018-10-05 (×2): 6 via TOPICAL

## 2018-10-04 MED ORDER — INSULIN ASPART 100 UNIT/ML ~~LOC~~ SOLN: 0.0000 [IU] | SUBCUTANEOUS | Status: DC

## 2018-10-04 MED ORDER — ACETAMINOPHEN 500 MG PO TABS
1000.0000 mg | ORAL_TABLET | Freq: Four times a day (QID) | ORAL | Status: DC
  Administered 2018-10-04 – 2018-10-09 (×17): 1000 mg via ORAL
  Filled 2018-10-04 (×17): qty 2

## 2018-10-04 MED ORDER — ALBUTEROL SULFATE (2.5 MG/3ML) 0.083% IN NEBU: 2.5000 mg | INHALATION_SOLUTION | RESPIRATORY_TRACT | Status: DC | PRN

## 2018-10-04 MED ORDER — MULTIVITAMINS PO CAPS: 1.0000 | ORAL_CAPSULE | Freq: Every day | ORAL | Status: DC

## 2018-10-04 MED ORDER — FENTANYL CITRATE (PF) 250 MCG/5ML IJ SOLN
INTRAMUSCULAR | Status: AC
  Filled 2018-10-04: qty 25

## 2018-10-04 MED ORDER — SODIUM CHLORIDE 0.9 % IJ SOLN
OROMUCOSAL | Status: DC | PRN
  Administered 2018-10-04: 4 mL via TOPICAL

## 2018-10-04 SURGICAL SUPPLY — 91 items
ADAPTER CARDIOPLEGIA (MISCELLANEOUS) ×2 IMPLANT
BAG DECANTER FOR FLEXI CONT (MISCELLANEOUS) ×4 IMPLANT
BANDAGE ELASTIC 4 VELCRO ST LF (GAUZE/BANDAGES/DRESSINGS) ×2 IMPLANT
BANDAGE ELASTIC 6 VELCRO ST LF (GAUZE/BANDAGES/DRESSINGS) ×2 IMPLANT
BASKET HEART  (ORDER IN 25'S) (MISCELLANEOUS) ×1
BASKET HEART (ORDER IN 25'S) (MISCELLANEOUS) ×1
BASKET HEART (ORDER IN 25S) (MISCELLANEOUS) ×2 IMPLANT
BLADE STERNUM SYSTEM 6 (BLADE) ×4 IMPLANT
BNDG GAUZE ELAST 4 BULKY (GAUZE/BANDAGES/DRESSINGS) ×4 IMPLANT
CANISTER SUCT 3000ML PPV (MISCELLANEOUS) ×4 IMPLANT
CANNULA EZ GLIDE AORTIC 21FR (CANNULA) ×4 IMPLANT
CANNULA GUNDRY RCSP 15FR (MISCELLANEOUS) ×2 IMPLANT
CATH CPB KIT HENDRICKSON (MISCELLANEOUS) ×4 IMPLANT
CATH ROBINSON RED A/P 18FR (CATHETERS) ×6 IMPLANT
CATH THORACIC 36FR (CATHETERS) ×4 IMPLANT
CATH THORACIC 36FR RT ANG (CATHETERS) ×4 IMPLANT
CLIP VESOCCLUDE MED 24/CT (CLIP) IMPLANT
CLIP VESOCCLUDE SM WIDE 24/CT (CLIP) ×6 IMPLANT
CRADLE DONUT ADULT HEAD (MISCELLANEOUS) ×4 IMPLANT
DERMABOND ADVANCED (GAUZE/BANDAGES/DRESSINGS) ×6
DERMABOND ADVANCED .7 DNX12 (GAUZE/BANDAGES/DRESSINGS) IMPLANT
DRAPE CARDIOVASCULAR INCISE (DRAPES) ×2
DRAPE SLUSH/WARMER DISC (DRAPES) ×4 IMPLANT
DRAPE SRG 135X102X78XABS (DRAPES) ×2 IMPLANT
DRSG AQUACEL AG ADV 3.5X14 (GAUZE/BANDAGES/DRESSINGS) ×2 IMPLANT
ELECT REM PT RETURN 9FT ADLT (ELECTROSURGICAL) ×8
ELECTRODE REM PT RTRN 9FT ADLT (ELECTROSURGICAL) ×4 IMPLANT
FELT TEFLON 1X6 (MISCELLANEOUS) ×6 IMPLANT
GAUZE SPONGE 4X4 12PLY STRL (GAUZE/BANDAGES/DRESSINGS) ×8 IMPLANT
GAUZE SPONGE 4X4 12PLY STRL LF (GAUZE/BANDAGES/DRESSINGS) ×4 IMPLANT
GLOVE BIOGEL M 6.5 STRL (GLOVE) ×18 IMPLANT
GLOVE BIOGEL M STER SZ 6 (GLOVE) ×6 IMPLANT
GLOVE SURG SIGNA 7.5 PF LTX (GLOVE) ×12 IMPLANT
GOWN STRL REUS W/ TWL LRG LVL3 (GOWN DISPOSABLE) ×8 IMPLANT
GOWN STRL REUS W/ TWL XL LVL3 (GOWN DISPOSABLE) ×4 IMPLANT
GOWN STRL REUS W/TWL LRG LVL3 (GOWN DISPOSABLE) ×18
GOWN STRL REUS W/TWL XL LVL3 (GOWN DISPOSABLE) ×4
HEMOSTAT POWDER SURGIFOAM 1G (HEMOSTASIS) ×8 IMPLANT
HEMOSTAT SURGICEL 2X14 (HEMOSTASIS) ×4 IMPLANT
INSERT FOGARTY XLG (MISCELLANEOUS) IMPLANT
KIT BASIN OR (CUSTOM PROCEDURE TRAY) ×4 IMPLANT
KIT SUCTION CATH 14FR (SUCTIONS) ×8 IMPLANT
KIT TURNOVER KIT B (KITS) ×4 IMPLANT
KIT VASOVIEW 6 PRO VH 2400 (KITS) ×2 IMPLANT
MARKER GRAFT CORONARY BYPASS (MISCELLANEOUS) ×12 IMPLANT
NS IRRIG 1000ML POUR BTL (IV SOLUTION) ×22 IMPLANT
PACK E OPEN HEART (SUTURE) ×4 IMPLANT
PACK OPEN HEART (CUSTOM PROCEDURE TRAY) ×4 IMPLANT
PAD ARMBOARD 7.5X6 YLW CONV (MISCELLANEOUS) ×8 IMPLANT
PAD ELECT DEFIB RADIOL ZOLL (MISCELLANEOUS) ×4 IMPLANT
PENCIL BUTTON HOLSTER BLD 10FT (ELECTRODE) ×4 IMPLANT
PUNCH AORTIC ROT 4.0MM RCL 40 (MISCELLANEOUS) ×2 IMPLANT
PUNCH AORTIC ROTATE 4.0MM (MISCELLANEOUS) IMPLANT
PUNCH AORTIC ROTATE 4.5MM 8IN (MISCELLANEOUS) IMPLANT
PUNCH AORTIC ROTATE 5MM 8IN (MISCELLANEOUS) IMPLANT
SENSOR MYOCARDIAL TEMP (MISCELLANEOUS) ×2 IMPLANT
SPECIMEN JAR SMALL (MISCELLANEOUS) ×4 IMPLANT
SPONGE LAP 18X18 X RAY DECT (DISPOSABLE) ×2 IMPLANT
SUT BONE WAX W31G (SUTURE) ×4 IMPLANT
SUT ETHIBOND NAB MH 2-0 36IN (SUTURE) ×2 IMPLANT
SUT MNCRL AB 4-0 PS2 18 (SUTURE) ×4 IMPLANT
SUT PROLENE 3 0 SH DA (SUTURE) ×4 IMPLANT
SUT PROLENE 4 0 RB 1 (SUTURE) ×8
SUT PROLENE 4 0 SH DA (SUTURE) IMPLANT
SUT PROLENE 4-0 RB1 .5 CRCL 36 (SUTURE) IMPLANT
SUT PROLENE 5 0 C 1 36 (SUTURE) ×4 IMPLANT
SUT PROLENE 6 0 C 1 30 (SUTURE) ×8 IMPLANT
SUT PROLENE 7 0 BV1 MDA (SUTURE) ×6 IMPLANT
SUT PROLENE 8 0 BV175 6 (SUTURE) IMPLANT
SUT STEEL 6MS V (SUTURE) ×4 IMPLANT
SUT STEEL STERNAL CCS#1 18IN (SUTURE) IMPLANT
SUT STEEL SZ 6 DBL 3X14 BALL (SUTURE) ×4 IMPLANT
SUT VIC AB 1 CTX 36 (SUTURE) ×4
SUT VIC AB 1 CTX36XBRD ANBCTR (SUTURE) ×4 IMPLANT
SUT VIC AB 2-0 CT1 27 (SUTURE) ×6
SUT VIC AB 2-0 CT1 TAPERPNT 27 (SUTURE) IMPLANT
SUT VIC AB 2-0 CTX 27 (SUTURE) IMPLANT
SUT VIC AB 3-0 SH 27 (SUTURE)
SUT VIC AB 3-0 SH 27X BRD (SUTURE) IMPLANT
SUT VIC AB 3-0 X1 27 (SUTURE) IMPLANT
SUT VICRYL 4-0 PS2 18IN ABS (SUTURE) IMPLANT
SYSTEM SAHARA CHEST DRAIN ATS (WOUND CARE) ×4 IMPLANT
TAPE CLOTH SURG 4X10 WHT LF (GAUZE/BANDAGES/DRESSINGS) ×2 IMPLANT
TAPE PAPER 2X10 WHT MICROPORE (GAUZE/BANDAGES/DRESSINGS) ×2 IMPLANT
TOWEL GREEN STERILE (TOWEL DISPOSABLE) ×6 IMPLANT
TOWEL GREEN STERILE FF (TOWEL DISPOSABLE) ×4 IMPLANT
TRAY FOLEY SLVR 16FR TEMP STAT (SET/KITS/TRAYS/PACK) ×4 IMPLANT
TUBE FEEDING 8FR 16IN STR KANG (MISCELLANEOUS) ×4 IMPLANT
TUBING INSUFFLATION (TUBING) ×4 IMPLANT
UNDERPAD 30X30 (UNDERPADS AND DIAPERS) ×4 IMPLANT
WATER STERILE IRR 1000ML POUR (IV SOLUTION) ×8 IMPLANT

## 2018-10-04 NOTE — Progress Notes (Signed)
RT note: rapid wean protocol initiated at 17:25 currently tolerating well.

## 2018-10-04 NOTE — Progress Notes (Signed)
Patient ID: Cassie Campbell, female   DOB: 23-Feb-1941, 77 y.o.   MRN: [id number] EVENING ROUNDS NOTE :     [address]       [place] [address]             [phone]                 Day of Surgery Procedure(s) (LRB): CORONARY ARTERY BYPASS GRAFTING (CABG) times 2 using left  Internal mammary artery to LAD, left greater saphenous vein - open harvest. (N/A) TRANSESOPHAGEAL ECHOCARDIOGRAM (TEE) (N/A)  Total Length of Stay:  LOS: 6 days  BP 116/79 (BP Location: Right Arm)   Pulse 89   Temp (!) 97 F (36.1 C) (Core)   Resp (!) 22   Ht 4\' 11"  (1.499 m)   Wt 56.4 kg   LMP 12/21/1999   SpO2 99%   BMI 25.13 kg/m   .Intake/Output      10/16 0701 - 10/17 0700   P.O.    I.V. (mL/kg) 2106.1 (37.3)   Blood 354   IV Piggyback 729.1   Total Intake(mL/kg) 3189.1 (56.5)   Urine (mL/kg/hr) 1730 (2.4)   Blood 900   Chest Tube 130   Total Output 2760   Net +429.1         . sodium chloride 20 mL/hr at 10/04/18 1900  . [START ON 10/05/2018] sodium chloride    . sodium chloride 10 mL/hr at 10/04/18 1459  . albumin human 12.5 g (10/04/18 1746)  . dexmedetomidine (PRECEDEX) IV infusion Stopped (10/04/18 1703)  . famotidine (PEPCID) IV Stopped (10/04/18 1455)  . insulin 0.4 mL/hr at 10/04/18 1900  . lactated ringers    . lactated ringers 10 mL/hr at 10/04/18 1900  . lactated ringers 10 mL/hr at 10/04/18 1900  . [START ON 10/05/2018] levofloxacin (LEVAQUIN) IV    . nitroGLYCERIN Stopped (10/04/18 1531)  . phenylephrine (NEO-SYNEPHRINE) Adult infusion 6 mcg/min (10/04/18 1900)  . potassium chloride    . vancomycin       Lab Results  Component Value Date   WBC 15.2 (H) 10/04/2018   HGB 9.5 (L) 10/04/2018   HCT 28.0 (L) 10/04/2018   PLT 247 10/04/2018   GLUCOSE 110 (H) 10/04/2018   CHOL 131 03/09/2018   TRIG 109.0 03/09/2018   HDL 46.50 03/09/2018   LDLCALC 63 03/09/2018   ALT 20 09/26/2018   AST 32 09/26/2018   NA 143 10/04/2018   K 3.8 10/04/2018   CL  105 10/04/2018   CREATININE 0.50 10/04/2018   BUN 7 (L) 10/04/2018   CO2 28 10/02/2018   TSH 2.14 03/09/2018   INR 1.41 10/04/2018   HGBA1C 5.9 (H) 10/04/2018   Stable postop extubated   Grace Isaac MD  Beeper [phone] Office [phone] 10/04/2018 7:51 PM

## 2018-10-04 NOTE — Progress Notes (Signed)
Hendrickson MD notified about ABG results. Orders received to increase RR to 16 and give one AMP of sodium bicarbonate. Will repeat ABG in an hour and continue to monitor closely.   Scheryl Darter RN

## 2018-10-04 NOTE — Brief Op Note (Signed)
09/26/2018 - 10/04/2018  3:26 PM  PATIENT:  Cassie Campbell  77 y.o. female  PRE-OPERATIVE DIAGNOSIS:  CAD  POST-OPERATIVE DIAGNOSIS:  CAD  PROCEDURE: TRANSESOPHAGEAL ECHOCARDIOGRAM (TEE),  MEDIAN STERNOTOMY for CORONARY ARTERY BYPASS GRAFTING (CABG) x 2   LIMA to LAD with ENDARTERECTOMY,   SVG to PDA  using LEFT THIGH GREATER SAPHENOUS VEIN HARVEST OPEN and LEFT INTERNAL MAMMARY ARTERY   SURGEON:  Surgeon(s) and Role:    [name], MD - Primary  PHYSICIAN ASSISTANT: Lars Pinks PA-C  ASSISTANTS: Vernie Murders RNFA  ANESTHESIA:   general  EBL:  900 mL   BLOOD ADMINISTERED:Two CC PRBC  DRAINS: Chest tubes placed in the mediastinal and pleural spaces    SPECIMEN:  Source of Specimen:  Mediastinal lymph node  DISPOSITION OF SPECIMEN:  PATHOLOGY  COUNTS CORRECT:  YES  DICTATION: .Dragon Dictation  PLAN OF CARE: Admit to inpatient   PATIENT DISPOSITION:  ICU - intubated and hemodynamically stable.   Delay start of Pharmacological VTE agent (>24hrs) due to surgical blood loss or risk of bleeding: yes  BASELINE WEIGHT: 56 kg  FINDINGS: preserved LV function. LIMA good quality, vein fair. Poor quality diffusely diseased targets NOT A CANDIDATE FOR REDO

## 2018-10-04 NOTE — Anesthesia Procedure Notes (Signed)
Procedure Name: Intubation Date/Time: 10/04/2018 8:53 AM Performed by: Alain Marion, CRNA Pre-anesthesia Checklist: Patient identified, Emergency Drugs available, Suction available, Patient being monitored and Timeout performed Patient Re-evaluated:Patient Re-evaluated prior to induction Oxygen Delivery Method: Circle system utilized Preoxygenation: Pre-oxygenation with 100% oxygen Induction Type: IV induction Ventilation: Mask ventilation without difficulty and Oral airway inserted - appropriate to patient size Laryngoscope Size: Sabra Heck and 2 Grade View: Grade I Tube type: Subglottic suction tube Tube size: 7.5 mm Number of attempts: 1 Airway Equipment and Method: Stylet Placement Confirmation: ETT inserted through vocal cords under direct vision,  positive ETCO2 and breath sounds checked- equal and bilateral Secured at: 22 cm Tube secured with: Tape Dental Injury: Teeth and Oropharynx as per pre-operative assessment

## 2018-10-04 NOTE — Anesthesia Procedure Notes (Signed)
Central Venous Catheter Insertion Performed by: Belinda Block, MD, anesthesiologist Start/End10/16/2019 7:50 AM, 10/04/2018 8:05 AM Position: Trendelenburg Lidocaine 1% used for infiltration and patient sedated Hand hygiene performed , maximum sterile barriers used  and Seldinger technique used Central line was placed.MAC introducer Procedure performed using ultrasound guided technique. Ultrasound Notes:anatomy identified and image(s) printed for medical record Attempts: 1 Following insertion, line sutured. Post procedure assessment: blood return through all ports  Patient tolerated the procedure well with no immediate complications.

## 2018-10-04 NOTE — Anesthesia Procedure Notes (Signed)
Arterial Line Insertion Start/End10/16/2019 7:54 AM Performed by: Alain Marion, CRNA, CRNA  Patient location: Pre-op. Preanesthetic checklist: patient identified, IV checked, site marked, risks and benefits discussed, surgical consent, monitors and equipment checked, pre-op evaluation, timeout performed and anesthesia consent Lidocaine 1% used for infiltration Left, radial was placed Catheter size: 20 G Hand hygiene performed  and maximum sterile barriers used   Attempts: 1 Procedure performed without using ultrasound guided technique. Ultrasound Notes:anatomy identified, needle tip was noted to be adjacent to the nerve/plexus identified and no ultrasound evidence of intravascular and/or intraneural injection Following insertion, dressing applied and Biopatch. Post procedure assessment: normal  Patient tolerated the procedure well with no immediate complications.

## 2018-10-04 NOTE — Transfer of Care (Signed)
Immediate Anesthesia Transfer of Care Note  Patient: Cassie Campbell  Procedure(s) Performed: CORONARY ARTERY BYPASS GRAFTING (CABG) times 2 using left  Internal mammary artery to LAD, left greater saphenous vein - open harvest. (N/A Chest) TRANSESOPHAGEAL ECHOCARDIOGRAM (TEE) (N/A )  Patient Location: SICU/2H Anesthesia Type:General  Level of Consciousness: Patient remains intubated per anesthesia plan  Airway & Oxygen Therapy: Patient remains intubated per anesthesia plan and Patient placed on Ventilator (see vital sign flow sheet for setting)  Post-op Assessment: Report given to RN and Post -op Vital signs reviewed and stable  Post vital signs: Reviewed and stable  Last Vitals:  Vitals Value Taken Time  BP 107/69 10/04/2018  2:15 PM  Temp 35.7 C 10/04/2018  2:18 PM  Pulse 72 10/04/2018  2:18 PM  Resp 14 10/04/2018  2:18 PM  SpO2 99 % 10/04/2018  2:18 PM  Vitals shown include unvalidated device data.  Last Pain:  Vitals:   10/04/18 0555  TempSrc: Oral  PainSc:       Patients Stated Pain Goal: 0 (73/42/87 6811)  Complications: No apparent anesthesia complications

## 2018-10-04 NOTE — Procedures (Signed)
Extubation Procedure Note  Patient Details:   Name: Cassie Campbell DOB: [birth year] MRN: [id number]   Airway Documentation:    Vent end date: 10/04/18 Vent end time: 1815   Evaluation  O2 sats: stable throughout Complications: No apparent complications Patient did tolerate procedure well. Bilateral Breath Sounds: Clear   Yes   Patient extubated to 2L nasal cannula.  Positive cuff leak noted.  No evidence of stridor.  Patient able to speak post extubation.  Sats currently 99%.  Vitals are stable.  NIF of -28, VC of 900L.  No complications noted.   Philomena Doheny 10/04/2018, 6:21 PM

## 2018-10-04 NOTE — Op Note (Signed)
NAME: DASHANTI, BURR MEDICAL RECORD [id number] ACCOUNT [id number] DATE OF BIRTH:12-Mar-1941 FACILITY: [facility] LOCATION: [facility] PHYSICIAN:Oiva Dibari Chaya Jan, MD  OPERATIVE REPORT  DATE OF PROCEDURE:  10/04/2018  PREOPERATIVE DIAGNOSIS:  Severe 3-vessel coronary disease with stable coronary syndrome.  POSTOPERATIVE DIAGNOSIS:  Severe 3-vessel coronary disease with stable coronary syndrome.  PROCEDURES:  Median sternotomy, extracorporeal circulation, Coronary artery bypass grafting x 2  Left internal mammary artery to left anterior descending with endarterectomy  Saphenous vein graft to posterior descending.  SURGEON:  Modesto Charon, MD  ASSISTANT:  Lars Pinks, PA-C.  ANESTHESIA:  General.  FINDINGS:  Transesophageal echo revealed preserved left ventricular function pre and post-bypass with no significant valvular pathology.  Vein fair quality.  Mammary good quality. Diffusely diseased, poor quality target vessels.  CLINICAL NOTE:  The patient is a 77 year old woman with known coronary artery disease who presented with an unstable coronary syndrome.  At catheterization, she was found to have severe 2-vessel coronary disease with a totally occluded right coronary and  a severely, diffusely diseased LAD.  She was offered coronary artery bypass grafting albeit with a high risk for early and/or late graft failure.  The indications, risks, benefits, and alternatives were discussed in detail with the patient.  She understood  and accepted the risks and agreed to proceed.  DESCRIPTION OF PROCEDURE:  The patient was brought to the operating room on 10/04/2018.  In the preoperative holding area,  Anesthesia placed a Swan-Ganz catheter and an arterial blood pressure monitoring line.  In the operating room, she was anesthetized  and intubated.  Intravenous antibiotics were administered.  A Foley catheter was placed.  Transesophageal echocardiography was performed by Dr. Smith Robert.   Findings as noted above.  The chest, abdomen and legs were prepped and draped in the usual sterile  fashion.  An incision was made in the medial aspect of the left leg at the level of the knee.  An attempt was made to isolate the greater saphenous vein at that level.  A small vein was found, but attempts to follow this endoscopically were unsuccessful.   Therefore, an incision was made in the groin.  The greater saphenous vein was identified and was harvested from the left thigh in an open technique with bridged incisions.  The saphenous vein was a fair quality conduit.  Simultaneously with the vein  harvest, a median sternotomy was performed and the left internal mammary artery was harvested using standard technique.  This was relatively difficult due to the patient's body habitus, but the mammary artery had excellent flow when divided distally.   Heparin 2000 units was administered during the vessel harvest.  The remainder full heparin dose was given prior to opening the pericardium.  The pericardium was opened and the ascending aorta was inspected.  There was no evidence of atherosclerotic disease.  After confirming adequate anticoagulation with ACT measurement, the aorta was cannulated via concentric 2-0 Ethibond pledgeted  pursestring sutures.  A dual stage venous cannula was placed via pursestring suture in the right atrial appendage.  Cardiopulmonary bypass was initiated.  Flows were maintained per protocol.  The patient was cooled to 34 degrees Celsius.  The coronary  arteries were inspected.  These were all diffusely diseased and heavily calcified vessels.  Anastomotic sites were chosen.  The conduits were inspected and cut to length.  A retrograde cardioplegia cannula was placed via pursestring suture in the right  atrium and directed into the coronary sinus.  An antegrade cardioplegia cannula was placed in  the ascending aorta.  A foam pad was placed in the pericardium to insulate the heart.  A  temperature probe was placed in the myocardial septum.  The aorta was cross clamped.  The left ventricle was emptied via the aortic root vent.  Cardiac arrest was achieved with a combination of cold antegrade and retrograde blood cardioplegia and topical iced saline.  One liter of cardioplegia was  administered.  An initial 500 mL of cardioplegia was given antegrade.  There was a rapid diastolic arrest.  With retrograde cardioplegia, there was septal cooling to 10 degrees Celsius.  The inferior wall of the heart was exposed.  An arteriotomy was made in the posterior descending.  This was a diffusely diseased poor quality target.  The vein was fair quality.  It was anastomosed end-to-side with a running 7-0 Prolene suture.  There  was calcified plaque that became dislodged with attempts to pass the suture.  At completion of the anastomosis, a probe did pass distally and with cardioplegia administration, there was satisfactory flow and hemostasis.  The anterior wall then was exposed and arteriotomy was made in the LAD.  There was a spiral calcified plaque in the LAD.  A limited endarterectomy was performed.  There was residual significant plaque distally as well as proximally.  The internal  mammary artery was brought through a window in the pericardium.  The distal limb was bevelled.  An end-to-side anastomosis was performed with a running 7-0 Prolene suture.  Distally, it is very difficult to determine the site of the lumen and retrograde  cardioplegia was administered to help identify the distal lumen as sutures were placed in this area.  At the completion of the mammary to LAD anastomosis, the bulldog clamp was briefly removed.  Septal rewarming was noted.  The bulldog clamps were  placed.  The mammary pedicle was tacked to the epicardial surface of the heart with 6-0 Prolene sutures.  The cardioplegia cannula was removed from the ascending aorta.  The vein graft was cut to length.  The proximal  anastomosis for the vein graft was performed to a 4.0 mm punch aortotomy with a running 6-0 Prolene suture.  At the completion of the proximal  anastomosis, the patient was placed in Trendelenburg position.  Lidocaine was administered.  The aortic root was deaired and the aortic crossclamp was removed.  The total crossclamp time was 66 minutes.  The patient spontaneously resumed sinus rhythm and  did not require defibrillation.  While rewarming was completed, all proximal and distal anastomoses were inspected for hemostasis.  Epicardial pacing wires were placed on the right ventricle and right atrium.  When the patient had rewarmed to a core temperature of 37 degrees Celsius,  she was weaned from cardiopulmonary bypass on the first attempt.  She was in sinus rhythm and did not require inotropic support.  Post-bypass transesophageal echocardiography showed preserved left ventricular function with good wall motion in all  segments.  The initial cardiac index was greater than 2 liters per minute per meter squared.  A test dose of protamine was administered and was well tolerated.  The atrial and aortic cannulae were removed.  The remainder of the protamine was administered without incident.  The chest was irrigated with warm saline.  Hemostasis was achieved.  The  pericardium was not closed.  The left pleural and mediastinal chest tubes were placed in separate subcostal incisions.  The sternum was closed with a combination of single and double heavy gauge stainless steel wires.  Pectoralis fascia, subcutaneous  tissue and skin were closed in standard fashion.  All sponge, needle and instrument counts were correct at the end of the procedure.    The patient was taken from the operating room to the Surgical Intensive Care Unit in critical but stable condition.  AN/NUANCE  D:10/04/2018 T:10/04/2018 JOB:[id number]

## 2018-10-04 NOTE — Progress Notes (Signed)
  Echocardiogram Echocardiogram Transesophageal has been performed.  Merida Alcantar L Androw 10/04/2018, 9:49 AM

## 2018-10-04 NOTE — Interval H&P Note (Signed)
History and Physical Interval Note:  10/04/2018 8:17 AM  Cassie Campbell  has presented today for surgery, with the diagnosis of CAD  The various methods of treatment have been discussed with the patient and family. After consideration of risks, benefits and other options for treatment, the patient has consented to  Procedure(s): CORONARY ARTERY BYPASS GRAFTING (CABG) (N/A) TRANSESOPHAGEAL ECHOCARDIOGRAM (TEE) (N/A) as a surgical intervention .  The patient's history has been reviewed, patient examined, no change in status, stable for surgery.  I have reviewed the patient's chart and labs.  Questions were answered to the patient's satisfaction.     [name]

## 2018-10-04 NOTE — Progress Notes (Signed)
Progress Note  Patient Name: Cassie Campbell Date of Encounter: 10/04/2018  Primary Cardiologist: Shelva Majestic, MD   Subjective   No chest pain, no dyspnea, no complaints. Planned for CABG tomorrow morning.  Inpatient Medications    Scheduled Meds: . [MAR Hold] atorvastatin  80 mg Oral q1800  . [MAR Hold] bisoprolol  5 mg Oral Daily  . diazepam  2 mg Oral Once  . [MAR Hold] ezetimibe  10 mg Oral Daily  . [MAR Hold] famotidine  20 mg Oral QHS  . magnesium sulfate  40 mEq Other To OR  . [MAR Hold] mometasone-formoterol  2 puff Inhalation BID  . [MAR Hold] pantoprazole  40 mg Oral Daily  . potassium chloride  80 mEq Other To OR  . [MAR Hold] ranolazine  1,000 mg Oral BID  . [MAR Hold] sodium chloride flush  3 mL Intravenous Q12H  . tranexamic acid  2 mg/kg Intracatheter To OR   Continuous Infusions: . [MAR Hold] sodium chloride 10 mL/hr at 09/29/18 0606  . dexmedetomidine    . DOPamine    . epinephrine    . heparin 30,000 units/NS 1000 mL solution for CELLSAVER    . heparin 1,100 Units/hr (10/03/18 2137)  . milrinone    . nitroGLYCERIN    . norepinephrine (LEVOPHED) Adult infusion     PRN Meds: [MAR Hold] sodium chloride, 0.9 % irrigation (POUR BTL), [MAR Hold] acetaminophen, [MAR Hold] ALPRAZolam, hemostatic agents, [MAR Hold] magnesium hydroxide, [MAR Hold] nitroGLYCERIN, [MAR Hold] ondansetron (ZOFRAN) IV, [MAR Hold] sodium chloride flush, [MAR Hold] sodium chloride flush, Surgifoam 1 Gm with 0.9% sodium chloride (4 ml) topical solution, temazepam, [MAR Hold] zolpidem   Vital Signs    Vitals:   10/03/18 1949 10/03/18 2043 10/03/18 2349 10/04/18 0555  BP:   108/67 123/76  Pulse:   75 73  Resp:    15  Temp: 98 F (36.7 C)  98.2 F (36.8 C) 97.9 F (36.6 C)  TempSrc: Oral   Oral  SpO2: 100% 96%  96%  Weight:    56.4 kg  Height:        Intake/Output Summary (Last 24 hours) at 10/04/2018 1152 Last data filed at 10/04/2018 1115 Gross per 24 hour  Intake  1920 ml  Output 105 ml  Net 1815 ml   Filed Weights   10/02/18 0526 10/03/18 0436 10/04/18 0555  Weight: 57.2 kg 56.5 kg 56.4 kg    Telemetry    n/a - Personally Reviewed  ECG    No new - Personally Reviewed  Physical Exam   In operating room this morning.  Labs    Chemistry Recent Labs  Lab 09/28/18 0413 10/02/18 0332  NA 138 138  K 3.7 4.0  CL 106 102  CO2 24 28  GLUCOSE 85 93  BUN 5* 6*  CREATININE 0.60 0.62  CALCIUM 9.0 9.0  GFRNONAA >60 >60  GFRAA >60 >60  ANIONGAP 8 8     Hematology Recent Labs  Lab 10/01/18 0348 10/02/18 0332 10/04/18 0329  WBC 8.4 8.4 8.1  RBC 3.55* 3.61* 3.45*  HGB 9.6* 9.8* 9.7*  HCT 30.5* 31.7* 29.6*  MCV 85.9 87.8 85.8  MCH 27.0 27.1 28.1  MCHC 31.5 30.9 32.8  RDW 17.6* 17.7* 17.9*  PLT 466* 460* 447*    Cardiac Enzymes No results for input(s): TROPONINI in the last 168 hours.  No results for input(s): TROPIPOC in the last 168 hours.   BNPNo results for input(s): BNP, PROBNP  in the last 168 hours.   DDimer No results for input(s): DDIMER in the last 168 hours.   Radiology    No results found.  Cardiac Studies   Cardiac cath 09/27/18  Acute Mrg lesion is 95% stenosed.  Mid RCA to Dist RCA lesion is 100% stenosed.  Prox RCA to Mid RCA lesion is 50% stenosed.  Mid RCA lesion is 80% stenosed.  Mid LM to Dist LM lesion is 25% stenosed.  Ost LAD lesion is 40% stenosed.  Prox LAD-1 lesion is 95% stenosed.  Prox LAD-2 lesion is 50% stenosed.  Mid LAD-1 lesion is 90% stenosed.  Mid LAD-2 lesion is 80% stenosed.  Dist LAD lesion is 80% stenosed.  The left ventricular ejection fraction is 50-55% by visual estimate.  LV end diastolic pressure is low.   Severe coronary calcification involving the LAD, circumflex, and RCA.  There is smooth 20 to 25% narrowing in the distal left main.  The LAD has 40% proximal calcified stenosis.  After the first septal perforating artery and be in the region of the  first diagonal vessel the LAD is severely calcified with 95% stenosis.  There is diffuse disease in the mid segment with 50% narrowing followed by 90% stenosis with distal 80% stenoses.  Calcified left circumflex vessel without high-grade obstructive disease.  Calcified RCA with 50% proximal 80% mid and total occlusion prior to the acute margin.  There is extensive left-to-right collateralization to the distal RCA via the left circulation.  Low normal global LV function with an ejection fraction at 50 to 55%.  There is small focal region of mild mid anterolateral posterior basal inferior hypo-contractility.  LVEDP  8 mm Hg.  RECOMMENDATION: Patient has severe coronary calcification.  Will review with colleagues.  Recommend surgical consultation for consideration of CABG revascularization surgery.  If patient is turned down surgically, consider atherectomy of proximal to mid LAD but there is diffuse distal LAD disease.  Will increase medical regimen and maximize ranolazine to 1000 mg twice a day.  Will hold Plavix but if turned down for surgery resume DAPT. Aggressive lipid-lowering therapy with target LDL less than 70.  Patient Profile     77 y.o. female with a history of complex CAD, s/p unsuccessful attempt at PCI Oct 2015(medically managed), ASA allergy, HTN, chronic RBBB and asthma, directly admitted from our office 09/26/18 for active CP c/w unstable angina.  Assessment & Plan  Principal Problem:   CAD (coronary artery disease) Active Problems:   Hyperlipidemia with target LDL less than 70   Essential hypertension   Right bundle branch block   Unstable angina ([place])  1. CAD with unstable angina  She is in the operating room this morning for CABG  2. HTN  Well controlled currently, on bisoprolol 5 mg daily.  3. Anemia  Stable, on heparin IV pre-OR without change in Hb.  4. HLD On atorvastatin 80mg  daily and ezetimibe 10 mg daily.  For questions or updates, please contact  [place] Please consult [email or web address] for contact info under     Signed, Elouise Munroe, MD  10/04/2018, 11:52 AM

## 2018-10-05 ENCOUNTER — Encounter (HOSPITAL_COMMUNITY): Payer: Self-pay | Admitting: Thoracic Surgery (Cardiothoracic Vascular Surgery)

## 2018-10-05 ENCOUNTER — Inpatient Hospital Stay (HOSPITAL_COMMUNITY): Payer: Medicare Other

## 2018-10-05 LAB — MAGNESIUM
MAGNESIUM: 2.1 mg/dL (ref 1.7–2.4)
Magnesium: 2.5 mg/dL — ABNORMAL HIGH (ref 1.7–2.4)

## 2018-10-05 LAB — POCT I-STAT, CHEM 8
BUN: 4 mg/dL — AB (ref 8–23)
CALCIUM ION: 1.26 mmol/L (ref 1.15–1.40)
Chloride: 98 mmol/L (ref 98–111)
Creatinine, Ser: 0.5 mg/dL (ref 0.44–1.00)
Glucose, Bld: 160 mg/dL — ABNORMAL HIGH (ref 70–99)
HCT: 29 % — ABNORMAL LOW (ref 36.0–46.0)
Hemoglobin: 9.9 g/dL — ABNORMAL LOW (ref 12.0–15.0)
Potassium: 4.2 mmol/L (ref 3.5–5.1)
SODIUM: 136 mmol/L (ref 135–145)
TCO2: 24 mmol/L (ref 22–32)

## 2018-10-05 LAB — GLUCOSE, CAPILLARY
GLUCOSE-CAPILLARY: 105 mg/dL — AB (ref 70–99)
GLUCOSE-CAPILLARY: 126 mg/dL — AB (ref 70–99)
GLUCOSE-CAPILLARY: 151 mg/dL — AB (ref 70–99)
GLUCOSE-CAPILLARY: 152 mg/dL — AB (ref 70–99)
Glucose-Capillary: 105 mg/dL — ABNORMAL HIGH (ref 70–99)
Glucose-Capillary: 120 mg/dL — ABNORMAL HIGH (ref 70–99)

## 2018-10-05 LAB — CREATININE, SERUM
CREATININE: 0.68 mg/dL (ref 0.44–1.00)
GFR calc Af Amer: >60 mL/min (ref >60)

## 2018-10-05 LAB — CBC
HCT: 29.1 % — ABNORMAL LOW (ref 36.0–46.0)
HEMATOCRIT: 29.9 % — AB (ref 36.0–46.0)
HEMOGLOBIN: 9.2 g/dL — AB (ref 12.0–15.0)
Hemoglobin: 9.5 g/dL — ABNORMAL LOW (ref 12.0–15.0)
MCH: 27.9 pg (ref 26.0–34.0)
MCH: 28.1 pg (ref 26.0–34.0)
MCHC: 31.8 g/dL (ref 30.0–36.0)
MCHC: 31.6 g/dL (ref 30.0–36.0)
MCV: 87.9 fL (ref 80.0–100.0)
MCV: 89 fL (ref 80.0–100.0)
Platelets: 229 10*3/uL (ref 150–400)
Platelets: 214 10*3/uL (ref 150–400)
RBC: 3.4 MIL/uL — ABNORMAL LOW (ref 3.87–5.11)
RBC: 3.27 MIL/uL — ABNORMAL LOW (ref 3.87–5.11)
RDW: 17.8 % — AB (ref 11.5–15.5)
RDW: 18.1 % — AB (ref 11.5–15.5)
WBC: 10.3 10*3/uL (ref 4.0–10.5)
WBC: 12.2 10*3/uL — AB (ref 4.0–10.5)
nRBC: 0 % (ref 0.0–0.2)
nRBC: 0 % (ref 0.0–0.2)

## 2018-10-05 LAB — BASIC METABOLIC PANEL
Anion gap: 9 (ref 5–15)
BUN: <5 mg/dL — ABNORMAL LOW (ref 8–23)
CALCIUM: 8.5 mg/dL — AB (ref 8.9–10.3)
CO2: 22 mmol/L (ref 22–32)
Chloride: 107 mmol/L (ref 98–111)
Creatinine, Ser: 0.68 mg/dL (ref 0.44–1.00)
GFR calc Af Amer: >60 mL/min (ref >60)
Glucose, Bld: 112 mg/dL — ABNORMAL HIGH (ref 70–99)
Potassium: 4.2 mmol/L (ref 3.5–5.1)
Sodium: 138 mmol/L (ref 135–145)

## 2018-10-05 MED ORDER — ENOXAPARIN SODIUM 40 MG/0.4ML ~~LOC~~ SOLN
40.0000 mg | Freq: Every day | SUBCUTANEOUS | Status: DC
  Administered 2018-10-05 – 2018-10-08 (×4): 40 mg via SUBCUTANEOUS
  Filled 2018-10-05 (×4): qty 0.4

## 2018-10-05 MED ORDER — CLOPIDOGREL BISULFATE 75 MG PO TABS
75.0000 mg | ORAL_TABLET | Freq: Every day | ORAL | Status: DC
  Administered 2018-10-05 – 2018-10-09 (×5): 75 mg via ORAL
  Filled 2018-10-05 (×5): qty 1

## 2018-10-05 MED ORDER — INSULIN ASPART 100 UNIT/ML ~~LOC~~ SOLN
0.0000 [IU] | SUBCUTANEOUS | Status: DC
  Administered 2018-10-05 (×2): 2 [IU] via SUBCUTANEOUS

## 2018-10-05 MED ORDER — MECLIZINE HCL 25 MG PO TABS: 50.0000 mg | ORAL_TABLET | Freq: Three times a day (TID) | ORAL | Status: DC | PRN

## 2018-10-05 MED ORDER — BISOPROLOL FUMARATE 5 MG PO TABS
5.0000 mg | ORAL_TABLET | Freq: Every day | ORAL | Status: DC
  Administered 2018-10-05 – 2018-10-07 (×3): 5 mg via ORAL
  Filled 2018-10-05 (×3): qty 1

## 2018-10-05 MED ORDER — FLUTICASONE PROPIONATE 50 MCG/ACT NA SUSP
2.0000 | Freq: Two times a day (BID) | NASAL | Status: DC | PRN
  Filled 2018-10-05: qty 16

## 2018-10-05 MED FILL — Heparin Sodium (Porcine) Inj 1000 Unit/ML: INTRAMUSCULAR | Qty: 30 | Status: AC

## 2018-10-05 MED FILL — Magnesium Sulfate Inj 50%: INTRAMUSCULAR | Qty: 10 | Status: AC

## 2018-10-05 MED FILL — Potassium Chloride Inj 2 mEq/ML: INTRAVENOUS | Qty: 40 | Status: AC

## 2018-10-05 NOTE — Discharge Instructions (Signed)
Discharge Instructions:  1. You may shower, please wash incisions daily with soap and water and keep dry.  If you wish to cover wounds with dressing you may do so but please keep clean and change daily.  No tub baths or swimming until incisions have completely healed.  If your incisions become red or develop any drainage please call our office at [phone]  2. No Driving until cleared by [facility] office and you are no longer using narcotic pain medications  3. Monitor your weight daily.. Please use the same scale and weigh at same time... If you gain 5-10 lbs in 48 hours with associated lower extremity swelling, please contact our office at [phone]  4. Fever of 101.5 for at least 24 hours with no source, please contact our office at [phone]  5. Activity- up as tolerated, please walk at least 3 times per day.  Avoid strenuous activity, no lifting, pushing, or pulling with your arms over 8-10 lbs for a minimum of 6 weeks  6. If any questions or concerns arise, please do not hesitate to contact our office at [phone]

## 2018-10-05 NOTE — Anesthesia Postprocedure Evaluation (Signed)
Anesthesia Post Note  Patient: JAMYA STARRY  Procedure(s) Performed: CORONARY ARTERY BYPASS GRAFTING (CABG) times 2 using left  Internal mammary artery to LAD, left greater saphenous vein - open harvest. (N/A Chest) TRANSESOPHAGEAL ECHOCARDIOGRAM (TEE) (N/A )     Patient location during evaluation: SICU Anesthesia Type: General Level of consciousness: awake Pain management: pain level controlled Vital Signs Assessment: post-procedure vital signs reviewed and stable Respiratory status: spontaneous breathing Cardiovascular status: stable Postop Assessment: no apparent nausea or vomiting Anesthetic complications: no Comments: No acute complaints.     Last Vitals:  Vitals:   10/05/18 1000 10/05/18 1100  BP: 110/62 101/66  Pulse: 88 82  Resp: 16 16  Temp:    SpO2: 100% 100%    Last Pain:  Vitals:   10/05/18 0800  TempSrc: Core  PainSc: 0-No pain                 Effie Berkshire

## 2018-10-05 NOTE — Discharge Summary (Signed)
Physician Discharge Summary       [address]       [place],Cassie [address]             [phone]    Patient ID: Cassie Campbell MRN: [id number] DOB/AGE: 1941/06/22 77 y.o.  Admit date: 09/26/2018 Discharge date: 10/09/2018  Admission Diagnoses: 1. Unstable angina ([place]) 2. History of CAD (coronary artery disease), history of NSTEMI  Discharge Diagnoses:  1. S/P CABG x 2 2. Right bundle branch block 3. ABL anemia 4. History of Hyperlipidemia  5. History of Essential hypertension 6. History of obesity 7. History of GERD (gastroesophageal reflux disease) 8. History of Ruptured disc, cervical 9. History of osteopenia 10. History of Spondylolisthesis at L4-L5 level   Procedure (s):  LEFT HEART CATH AND CORONARY ANGIOGRAPHY by Dr. Claiborne Billings on 09/27/2018:  Conclusion     Acute Mrg lesion is 95% stenosed.  Mid RCA to Dist RCA lesion is 100% stenosed.  Prox RCA to Mid RCA lesion is 50% stenosed.  Mid RCA lesion is 80% stenosed.  Mid LM to Dist LM lesion is 25% stenosed.  Ost LAD lesion by Dr.  is 40% stenosed.  Prox LAD-1 lesion is 95% stenosed.  Prox LAD-2 lesion is 50% stenosed.  Mid LAD-1 lesion is 90% stenosed.  Mid LAD-2 lesion is 80% stenosed.  Dist LAD lesion is 80% stenosed.  The left ventricular ejection fraction is 50-55% by visual estimate.  LV end diastolic pressure is low.   Severe coronary calcification involving the LAD, circumflex, and RCA.  There is smooth 20 to 25% narrowing in the distal left main.  The LAD has 40% proximal calcified stenosis.  After the first septal perforating artery and be in the region of the first diagonal vessel the LAD is severely calcified with 95% stenosis.  There is diffuse disease in the mid segment with 50% narrowing followed by 90% stenosis with distal 80% stenoses.  Calcified left circumflex vessel without high-grade obstructive disease.  Calcified RCA with 50% proximal 80% mid and total  occlusion prior to the acute margin.  There is extensive left-to-right collateralization to the distal RCA via the left circulation.  Low normal global LV function with an ejection fraction at 50 to 55%.  There is small focal region of mild mid anterolateral posterior basal inferior hypo-contractility.  LVEDP  8 mm Hg.     Median sternotomy, extracorporeal circulation, coronary artery bypass grafting x2 (left internal mammary artery to left anterior descending with endarterectomy and saphenous vein graft to posterior descending by Dr. Roxan Hockey on 10/04/2018.  History of Presenting Illness: Cassie Campbell is a 77 yo woman with known CAD with an MI in 57. Attempted PCI- unsuccessful, treated medically. Also has a history of hypertension, hyperlipidemia, GRED, obesity, depression, asthma, arthritis and DDD. She was in her usual state of health until Sunday when she had onset of pain in her right shoulder blade, then a profound heaviness in her chest and pain in the left neck and shoulder. Waxed and waned. Went to Cardiology on Tuesday and was admitted to the hospital. R/o for MI. Yesterday underwent cardiac catheterization which revealed severe 2 vessel CAD involving RCA and LAD. Pain free since admission.  Was on Plavix prior to admission. Vein mapping was done and she apparently had a small left thigh greater saphenous vein. After discussion with cardiology, it was decided patient would undergo CABG. Potential risks, benefits, and complications of the surgery were discussed with the patient and she  agreed to proceed with surgery. Pre operative carotid duplex US showed no significant internal carotid artery stenosis bilaterally. Patient underwent a CABG x 2 with LAD endarterectomy on 10/04/2018.   Brief Hospital Course:  The patient was extubated the evening of surgery without difficulty. She had a RBBB post op which was present pre op. She remained afebrile and hemodynamically stable. Cassie Campbell, a  line, chest tubes, and foley were removed early in the post operative course. Lopressor was started and titrated accordingly. She was restarted on Plavix 75 mg daily. She was volume over loaded and diuresed. She had ABL anemia. She did not require a post op transfusion. Last H and H was 10/32. She was weaned off the insulin drip.  The patient's glucose remained well controlled. The patient's HGA1C pre op was 5.9. 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 She will need further surveillance of her HGA1C with her medical doctor after discharge.  The patient was felt surgically stable for transfer from the ICU to PCTU for further convalescence . She continues to progress with cardiac rehab. She was ambulating on room air. She has been tolerating a diet and has had a bowel movement. Epicardial pacing wires were removed on 10/08/2018. Chest tube sutures will be removed the day of discharge. The patient is felt surgically stable for discharge today.   Latest Vital Signs: Blood pressure 139/79, pulse 88, temperature 97.9 F (36.6 C), temperature source Oral, resp. rate 17, height 4\' 11"  (1.499 m), weight 57.4 kg, last menstrual period 12/21/1999, SpO2 98 %.   Physical Exam: General appearance: alert, cooperative and no distress Heart: regular rate and rhythm Lungs: minor ronchi/wheeze, improves with cough Abdomen: benign Extremities: trace edema Wound: incis healing well Discharge Condition: Stable and discharged to home.  Recent laboratory studies:  Lab Results  Component Value Date   WBC 13.5 (H) 10/07/2018   HGB 10.0 (L) 10/07/2018   HCT 32.7 (L) 10/07/2018   MCV 88.1 10/07/2018   PLT 290 10/07/2018   Lab Results  Component Value Date   NA 139 10/07/2018   K 3.9 10/07/2018   CL 101 10/07/2018   CO2 29 10/07/2018   CREATININE 0.64 10/07/2018   GLUCOSE 123 (H) 10/07/2018      Diagnostic Studies: Dg Chest 2 View  Result Date: 10/07/2018 CLINICAL DATA:  77 year old female with a history of CABG  EXAM: CHEST - 2 VIEW COMPARISON:  10/06/2018 FINDINGS: Cardiomediastinal silhouette unchanged in size and contour. Surgical changes of median sternotomy and CABG. Blunting of the bilateral costophrenic angle with right greater than left opacity obscuring the hemidiaphragm. Meniscus on the lateral view. No pneumothorax. Epicardial pacing leads in the upper abdomen. Interval removal of right IJ sheath. No displaced fracture. IMPRESSION: Small right greater than left pleural effusion with associated atelectasis. No evidence of edema. Surgical changes of median sternotomy and CABG. Interval removal of right IJ sheath, with epicardial pacing leads persisting. Electronically Signed   By: Corrie Mckusick D.O.   On: 10/07/2018 08:45   Dg Chest 2 View  Result Date: 09/26/2018 CLINICAL DATA:  Chest pain EXAM: CHEST - 2 VIEW COMPARISON:  11/29/2017 FINDINGS: Heart size and vascularity normal. Chronic right middle lobe atelectasis unchanged from prior studies. No acute infiltrate or effusion. Mild bibasilar scarring unchanged. IMPRESSION: No acute abnormality. Bibasilar scarring and right middle lobe chronic atelectasis. Electronically Signed   By: Franchot Gallo M.D.   On: 09/26/2018 16:04   Dg Chest Port 1 View  Result Date: 10/06/2018 CLINICAL DATA:  Status post coronary bypass grafting EXAM: PORTABLE CHEST 1 VIEW COMPARISON:  10/05/2018 FINDINGS: Cardiac shadow is mildly enlarged but stable. The Swan-Ganz catheter has been removed in the interval. Mediastinal drain and left thoracostomy catheter have also been removed in the interval. No definitive pneumothorax is seen. Right basilar atelectasis and small effusion are noted stable from the prior exam. IMPRESSION: No pneumothorax following chest tube removal. Right basilar atelectasis and associated effusion. Electronically Signed   By: Inez Catalina M.D.   On: 10/06/2018 08:39   Dg Chest Port 1 View  Result Date: 10/05/2018 CLINICAL DATA:  Chest tube.  CABG.  EXAM: PORTABLE CHEST 1 VIEW COMPARISON:  10/04/2018. FINDINGS: Interim removal of endotracheal tube and NG tube. Mediastinal drainage catheter, Swan-Ganz catheter, left chest tube in stable position. Prior CABG. Right lower lobe atelectasis and consolidation. Prior cervicothoracic spine fusion. IMPRESSION: 1.  Interim removal of endotracheal tube and NG tube. 2. Remaining lines and tubes including left chest tube in stable position. 3.  Right lower lobe atelectasis. Electronically Signed   By: Marcello Moores  Register   On: 10/05/2018 09:31   Dg Chest Port 1 View  Result Date: 10/04/2018 CLINICAL DATA:  Coronary artery disease.  Status post CABG today. EXAM: PORTABLE CHEST 1 VIEW COMPARISON:  09/26/2018 FINDINGS: Endotracheal tube in good position 4 cm above the carina. NG tube tip loops into the fundus of the stomach and then back into the distal esophagus. Swan-Ganz catheter tip in the right main pulmonary artery. Left chest tube and mediastinal tube in place. No pneumothorax. Minimal atelectasis at the right lung base. Lungs are otherwise clear. Vascularity is normal. No acute bone abnormality. IMPRESSION: NG tube tip loops back into the distal esophagus from the fundus of the stomach. Other support apparatus appears in good position. Minimal atelectasis at the right lung base. Critical Value/emergent results were called by telephone at the time of interpretation on 10/04/2018 at 2:49 pm to [facility], [place], who verbally acknowledged these results. Electronically Signed   By: Lorriane Shire M.D.   On: 10/04/2018 14:50       Discharge Instructions    Discharge patient   Complete by:  As directed    After home care arrangements made   Discharge disposition:  01-Home or Self Care   Discharge patient date:  10/09/2018      Discharge Medications: Allergies as of 10/09/2018      Reactions   Fish-derived Products Shortness Of Breath, Swelling, Rash   Penicillins Shortness Of Breath, Swelling, Rash   PATIENT  HAS HAD A PCN REACTION WITH IMMEDIATE RASH, FACIAL/TONGUE/THROAT SWELLING, SOB, OR LIGHTHEADEDNESS WITH HYPOTENSION:  #  #  #  YES  #  #  #   Has patient had a PCN reaction causing severe rash involving mucus membranes or skin necrosis: No PATIENT HAS HAD A PCN REACTION THAT REQUIRED HOSPITALIZATION:  #  #  #  YES  #  #  #   Has patient had a PCN reaction occurring within the last 10 years: No   Aspirin Swelling, Rash   SWELLING REACTION UNSPECIFIED    Brilinta [ticagrelor] Rash   Started Brilinta 09/2014 - rash - unsure which it is directly related to   Codeine Phosphate Nausea Only   Diphenhydramine Hcl Rash      Medication List    STOP taking these medications   amLODipine 10 MG tablet Commonly known as:  NORVASC   celecoxib 200 MG capsule Commonly known as:  CELEBREX  ezetimibe 10 MG tablet Commonly known as:  ZETIA   famotidine 20 MG tablet Commonly known as:  PEPCID   furosemide 20 MG tablet Commonly known as:  LASIX   isosorbide mononitrate 60 MG 24 hr tablet Commonly known as:  IMDUR   nitroGLYCERIN 0.4 MG SL tablet Commonly known as:  NITROSTAT   omeprazole 20 MG capsule Commonly known as:  PRILOSEC   ranolazine 500 MG 12 hr tablet Commonly known as:  RANEXA   sodium chloride 0.65 % nasal spray Commonly known as:  OCEAN     TAKE these medications   acetaminophen 500 MG tablet Commonly known as:  TYLENOL Take 1 tablet (500 mg total) by mouth every 6 (six) hours as needed for up to 5 days.   atorvastatin 80 MG tablet Commonly known as:  LIPITOR TAKE 1 TABLET(80 MG) BY MOUTH DAILY   bisoprolol 5 MG tablet Commonly known as:  ZEBETA TAKE 1 TABLET(5 MG) BY MOUTH DAILY   cetirizine 10 MG tablet Commonly known as:  ZYRTEC Take 10 mg by mouth at bedtime as needed (itching and sneezing).   Cholecalciferol 2000 units Tabs Take 1 tablet (2,000 Units total) by mouth daily.   clopidogrel 75 MG tablet Commonly known as:  PLAVIX Take 1 tablet (75 mg  total) by mouth daily.   clotrimazole-betamethasone cream Commonly known as:  LOTRISONE Apply 1 application topically as needed (itching). Use as directed for rash   dextromethorphan-guaiFENesin 30-600 MG 12hr tablet Commonly known as:  MUCINEX DM Take 1-2 tablets by mouth every 12 (twelve) hours as needed for cough (with flutter).   fluticasone 50 MCG/ACT nasal spray Commonly known as:  FLONASE Place 2 sprays into both nostrils 2 (two) times daily as needed for allergies or rhinitis.   meclizine 25 MG tablet Commonly known as:  ANTIVERT TAKE 2 TABLETS BY MOUTH THREE TIMES DAILY AS NEEDED What changed:  See the new instructions.   montelukast 10 MG tablet Commonly known as:  SINGULAIR TAKE 1 TABLET(10 MG) BY MOUTH AT BEDTIME   multivitamin capsule Take 1 capsule by mouth daily.   oxymetazoline 0.05 % nasal spray Commonly known as:  AFRIN Place 2 sprays into both nostrils 2 (two) times daily as needed for congestion. as needed for stuffy nose and sinus problems   PROAIR HFA 108 (90 Base) MCG/ACT inhaler Generic drug:  albuterol INHALE 1 TO 2 PUFFS BY MOUTH EVERY 4 HOURS AS NEEDED FOR WHEEZING   SYMBICORT 160-4.5 MCG/ACT inhaler Generic drug:  budesonide-formoterol INHALE 2 PUFFS BY MOUTH EVERY 12 HOURS What changed:  Another medication with the same name was removed. Continue taking this medication, and follow the directions you see here.   traMADol 50 MG tablet Commonly known as:  ULTRAM Take 1 tablet (50 mg total) by mouth every 6 (six) hours as needed for up to 5 days for moderate pain.      The patient has been discharged on:   1.Beta Blocker:  Yes [ x  ]                              No   [   ]                              If No, reason:  2.Ace Inhibitor/ARB: Yes [   ]  No  [ n   ]                                     If No, reason:BP controlled, low to start, mat be able as outpatient  3.Statin:   Yes [ x  ]                   No  [   ]                  If No, reason:  4.Ecasa:  Yes  [   ]                  No   [ x  ]                  If No, reason:Allergy  Follow Up Appointments: Follow-up Information    [name], MD. Go on 11/14/2018.   Specialty:  Cardiothoracic Surgery Why:  PA/LAT CXR to be taken (at [facility] which is in the same building as Dr. Leonarda Salon office) on 11/14/2018 at 9:30 am;Appointment time is at 10:00 am Contact information: [place] [address] [phone]        Almyra Deforest, [place] Follow up on 10/16/2018.   Specialties:  Cardiology, Radiology Why:  Please arrive 15 minutes early for your 3pm post-hospital cardiology follow-up appointment Contact information: [address] [place] [address] [phone]           Signed: Gaspar Bidding 10/09/2018, 7:46 AM

## 2018-10-05 NOTE — Progress Notes (Signed)
1 Day Post-Op Procedure(s) (LRB): CORONARY ARTERY BYPASS GRAFTING (CABG) times 2 using left  Internal mammary artery to LAD, left greater saphenous vein - open harvest. (N/A) TRANSESOPHAGEAL ECHOCARDIOGRAM (TEE) (N/A) Subjective: No complaints this AM Denies pain and nausea  Objective: Vital signs in last 24 hours: Temp:  [96.6 F (35.9 C)-99.1 F (37.3 C)] 99.1 F (37.3 C) (10/17 0700) Pulse Rate:  [71-90] 84 (10/17 0700) Cardiac Rhythm: Normal sinus rhythm (10/17 0800) Resp:  [12-22] 14 (10/17 0700) BP: (83-134)/(53-87) 114/64 (10/17 0700) SpO2:  [92 %-100 %] 99 % (10/17 0700) Arterial Line BP: (74-140)/(49-78) 74/60 (10/17 0700) FiO2 (%):  [40 %-50 %] 40 % (10/16 1745) Weight:  [60.6 kg] 60.6 kg (10/17 0600)  Hemodynamic parameters for last 24 hours: PAP: (32-51)/(14-24) 42/18 CO:  [3.1 L/min-4 L/min] 3.7 L/min CI:  [2.1 L/min/m2-2.7 L/min/m2] 2.5 L/min/m2  Intake/Output from previous day: 10/16 0701 - 10/17 0700 In: 3879.9 [I.V.:2539.7; Blood:354; IV Piggyback:986.2] Out: 6195 [Urine:2380; Blood:900; Chest Tube:310] Intake/Output this shift: No intake/output data recorded.  General appearance: alert, cooperative and no distress Neurologic: intact Heart: regular rate and rhythm Lungs: diminished breath sounds bibasilar Abdomen: normal findings: soft, non-tender  Lab Results: Recent Labs    10/04/18 2057 10/04/18 2101 10/05/18 0407  WBC 11.9*  --  10.3  HGB 9.9* 9.9* 9.5*  HCT 30.9* 29.0* 29.9*  PLT 228  --  229   BMET:  Recent Labs    10/04/18 2101 10/05/18 0407  NA 141 138  K 4.4 4.2  CL 107 107  CO2  --  22  GLUCOSE 125* 112*  BUN 5* <5*  CREATININE 0.50 0.68  CALCIUM  --  8.5*    PT/INR:  Recent Labs    10/04/18 1415  LABPROT 17.1*  INR 1.41   ABG    Component Value Date/Time   PHART 7.336 (L) 10/04/2018 1925   HCO3 25.4 10/04/2018 1925   TCO2 24 10/04/2018 2101   ACIDBASEDEF 1.0 10/04/2018 1925   O2SAT 97.0 10/04/2018 1925   CBG  (last 3)  Recent Labs    10/04/18 1853 10/04/18 1953 10/05/18 0009  GLUCAP 97 120* 105*    Assessment/Plan: S/P Procedure(s) (LRB): CORONARY ARTERY BYPASS GRAFTING (CABG) times 2 using left  Internal mammary artery to LAD, left greater saphenous vein - open harvest. (N/A) TRANSESOPHAGEAL ECHOCARDIOGRAM (TEE) (N/A) -POD # 1. Looks great CV- s/p CABG with endarterectomy of LAD- poor targets- continue ASA, restart plavix  ECG with RBBB - was present preop  Good hemodynamics- dc swan and A line  RESP_ IS, bronchodilators  RENAL- creatinine and lytes OK  Weight up about 4 kg- will hold off on diuresis for now  ENDO- CBG well controlled- SSi Q4 PRN  Anemia acute secondary to ABL on chronic- transfused intraop, Hgb Ok this AM  SCD + enoxaparin for DVT prophylaxis  GI- advance diet as tolerated  Dc chest tubes  Cardiac rehab    LOS: 7 days    [name] 10/05/2018

## 2018-10-05 NOTE — Progress Notes (Signed)
      [address]       [place] [address]             [phone]      Resting comfortably  BP 121/64 (BP Location: Right Arm)   Pulse 81   Temp 98.1 F (36.7 C) (Oral)   Resp 16   Ht 4\' 11"  (1.499 m)   Wt 60.6 kg   LMP 12/21/1999   SpO2 100%   BMI 26.98 kg/m   Intake/Output Summary (Last 24 hours) at 10/05/2018 2116 Last data filed at 10/05/2018 2000 Gross per 24 hour  Intake 1339.31 ml  Output 765 ml  Net 574.31 ml   Creatinine 0.5, K= 4.2, Hct=29 CBG well controlled  Remo Lipps C. Roxan Hockey, MD [facility] [phone]

## 2018-10-06 ENCOUNTER — Inpatient Hospital Stay (HOSPITAL_COMMUNITY): Payer: Medicare Other

## 2018-10-06 ENCOUNTER — Telehealth: Payer: Self-pay | Admitting: Physician Assistant

## 2018-10-06 LAB — GLUCOSE, CAPILLARY
GLUCOSE-CAPILLARY: 117 mg/dL — AB (ref 70–99)
GLUCOSE-CAPILLARY: 75 mg/dL (ref 70–99)
GLUCOSE-CAPILLARY: 118 mg/dL — AB (ref 70–99)
GLUCOSE-CAPILLARY: 127 mg/dL — AB (ref 70–99)
GLUCOSE-CAPILLARY: 117 mg/dL — AB (ref 70–99)
Glucose-Capillary: 110 mg/dL — ABNORMAL HIGH (ref 70–99)
Glucose-Capillary: 121 mg/dL — ABNORMAL HIGH (ref 70–99)

## 2018-10-06 LAB — CBC
HCT: 29.5 % — ABNORMAL LOW (ref 36.0–46.0)
Hemoglobin: 9 g/dL — ABNORMAL LOW (ref 12.0–15.0)
MCH: 27 pg (ref 26.0–34.0)
MCHC: 30.5 g/dL (ref 30.0–36.0)
MCV: 88.6 fL (ref 80.0–100.0)
NRBC: 0.2 % (ref 0.0–0.2)
Platelets: 239 10*3/uL (ref 150–400)
RBC: 3.33 MIL/uL — AB (ref 3.87–5.11)
RDW: 17.8 % — AB (ref 11.5–15.5)
WBC: 12.6 10*3/uL — ABNORMAL HIGH (ref 4.0–10.5)

## 2018-10-06 LAB — BASIC METABOLIC PANEL
ANION GAP: 8 (ref 5–15)
BUN: <5 mg/dL — ABNORMAL LOW (ref 8–23)
CALCIUM: 9 mg/dL (ref 8.9–10.3)
CO2: 24 mmol/L (ref 22–32)
Chloride: 101 mmol/L (ref 98–111)
Creatinine, Ser: 0.53 mg/dL (ref 0.44–1.00)
Glucose, Bld: 113 mg/dL — ABNORMAL HIGH (ref 70–99)
Potassium: 3.9 mmol/L (ref 3.5–5.1)
SODIUM: 133 mmol/L — AB (ref 135–145)

## 2018-10-06 MED ORDER — SODIUM CHLORIDE 0.9 % IV SOLN: 250.0000 mL | INTRAVENOUS | Status: DC | PRN

## 2018-10-06 MED ORDER — ALUM & MAG HYDROXIDE-SIMETH 200-200-20 MG/5ML PO SUSP: 15.0000 mL | Freq: Four times a day (QID) | ORAL | Status: DC | PRN

## 2018-10-06 MED ORDER — SODIUM CHLORIDE 0.9% FLUSH: 3.0000 mL | INTRAVENOUS | Status: DC | PRN

## 2018-10-06 MED ORDER — SODIUM CHLORIDE 0.9% FLUSH
3.0000 mL | Freq: Two times a day (BID) | INTRAVENOUS | Status: DC
  Administered 2018-10-06 – 2018-10-08 (×5): 3 mL via INTRAVENOUS

## 2018-10-06 MED ORDER — POTASSIUM CHLORIDE CRYS ER 10 MEQ PO TBCR
20.0000 meq | EXTENDED_RELEASE_TABLET | Freq: Every day | ORAL | Status: DC
  Administered 2018-10-06 – 2018-10-09 (×4): 20 meq via ORAL
  Filled 2018-10-06 (×6): qty 2

## 2018-10-06 MED ORDER — ALBUTEROL SULFATE (2.5 MG/3ML) 0.083% IN NEBU
2.5000 mg | INHALATION_SOLUTION | Freq: Four times a day (QID) | RESPIRATORY_TRACT | Status: AC
  Administered 2018-10-06 (×3): 2.5 mg via RESPIRATORY_TRACT
  Filled 2018-10-06 (×4): qty 3

## 2018-10-06 MED ORDER — MAGNESIUM HYDROXIDE 400 MG/5ML PO SUSP: 30.0000 mL | Freq: Every day | ORAL | Status: DC | PRN

## 2018-10-06 MED ORDER — GUAIFENESIN ER 600 MG PO TB12
600.0000 mg | ORAL_TABLET | Freq: Two times a day (BID) | ORAL | Status: AC
  Administered 2018-10-06 – 2018-10-08 (×5): 600 mg via ORAL
  Filled 2018-10-06 (×5): qty 1

## 2018-10-06 MED ORDER — MOVING RIGHT ALONG BOOK
Freq: Once | Status: AC
  Administered 2018-10-06: 21:00:00
  Filled 2018-10-06: qty 1

## 2018-10-06 MED ORDER — INSULIN ASPART 100 UNIT/ML ~~LOC~~ SOLN
0.0000 [IU] | Freq: Three times a day (TID) | SUBCUTANEOUS | Status: DC
  Administered 2018-10-06 – 2018-10-07 (×2): 2 [IU] via SUBCUTANEOUS

## 2018-10-06 MED ORDER — FUROSEMIDE 40 MG PO TABS
40.0000 mg | ORAL_TABLET | Freq: Every day | ORAL | Status: DC
  Administered 2018-10-06 – 2018-10-09 (×4): 40 mg via ORAL
  Filled 2018-10-06 (×4): qty 1

## 2018-10-06 NOTE — Progress Notes (Signed)
Report called to receiving Rn 4E. Patient with no complaints at the current time. Will transfer via [place] with Rn.

## 2018-10-06 NOTE — Telephone Encounter (Signed)
TOC Patient-  Please call Patient-  Pt has an appointment on 10-16-18 with Almyra Deforest.

## 2018-10-06 NOTE — Progress Notes (Signed)
2 Days Post-Op Procedure(s) (LRB): CORONARY ARTERY BYPASS GRAFTING (CABG) times 2 using left  Internal mammary artery to LAD, left greater saphenous vein - open harvest. (N/A) TRANSESOPHAGEAL ECHOCARDIOGRAM (TEE) (N/A) Subjective: No complaints this AM, denies pain, nausea yesterday improved today  Objective: Vital signs in last 24 hours: Temp:  [97.8 F (36.6 C)-99.5 F (37.5 C)] 97.8 F (36.6 C) (10/18 0746) Pulse Rate:  [77-88] 85 (10/18 0600) Cardiac Rhythm: Bundle branch block (10/18 0400) Resp:  [14-25] 14 (10/18 0600) BP: (101-133)/(58-101) 107/61 (10/18 0500) SpO2:  [95 %-100 %] 96 % (10/18 0600) Arterial Line BP: (124)/(57) 124/57 (10/17 0900) Weight:  [59.3 kg] 59.3 kg (10/18 0433)  Hemodynamic parameters for last 24 hours: PAP: (37-38)/(10-18) 38/10 CO:  [3.7 L/min] 3.7 L/min CI:  [2.5 L/min/m2] 2.5 L/min/m2  Intake/Output from previous day: 10/17 0701 - 10/18 0700 In: 852.3 [I.V.:702.2; IV Piggyback:150.1] Out: 405 [Urine:365; Chest Tube:40] Intake/Output this shift: No intake/output data recorded.  General appearance: alert, cooperative and no distress Neurologic: intact Heart: regular rate and rhythm Lungs: diminished breath sounds bibasilar and wheezes bilaterally Abdomen: normal findings: soft, non-tender  Lab Results: Recent Labs    10/05/18 1532 10/05/18 1534 10/06/18 0338  WBC 12.2*  --  12.6*  HGB 9.2* 9.9* 9.0*  HCT 29.1* 29.0* 29.5*  PLT 214  --  239   BMET:  Recent Labs    10/05/18 0407  10/05/18 1534 10/06/18 0338  NA 138  --  136 133*  K 4.2  --  4.2 3.9  CL 107  --  98 101  CO2 22  --   --  24  GLUCOSE 112*  --  160* 113*  BUN <5*  --  4* <5*  CREATININE 0.68   < > 0.50 0.53  CALCIUM 8.5*  --   --  9.0   < > = values in this interval not displayed.    PT/INR:  Recent Labs    10/04/18 1415  LABPROT 17.1*  INR 1.41   ABG    Component Value Date/Time   PHART 7.336 (L) 10/04/2018 1925   HCO3 25.4 10/04/2018 1925   TCO2  24 10/05/2018 1534   ACIDBASEDEF 1.0 10/04/2018 1925   O2SAT 97.0 10/04/2018 1925   CBG (last 3)  Recent Labs    10/05/18 2351 10/06/18 0344 10/06/18 0748  GLUCAP 121* 75 118*    Assessment/Plan: S/P Procedure(s) (LRB): CORONARY ARTERY BYPASS GRAFTING (CABG) times 2 using left  Internal mammary artery to LAD, left greater saphenous vein - open harvest. (N/A) TRANSESOPHAGEAL ECHOCARDIOGRAM (TEE) (N/A) Plan for transfer to step-down: see transfer orders  CV- s/p CABG, poor target vessels- on Plavix, ASA allergy  Continue bisoprolol, lipitor  RESP- has some right basilar atelectasis  Change duoneb to QID, continue dulera and singulair  Add mucinex and flutter valve  RENAL- creatinine and lytes Ok  Start PO lasix  ENDO- CBG well controlled - change to AC/HS  Anemia secondary to ABL- improved post transusion, follow  Dc central line and Foley  Continue cardiac rehab   LOS: 8 days    [name] 10/06/2018

## 2018-10-06 NOTE — Plan of Care (Signed)
  Problem: Coping: Goal: Level of anxiety will decrease Outcome: Progressing   Problem: Pain Managment: Goal: General experience of comfort will improve Outcome: Progressing   Problem: Skin Integrity: Goal: Risk for impaired skin integrity will decrease Outcome: Progressing   Problem: Respiratory: Goal: Respiratory status will improve Outcome: Progressing   Problem: Urinary Elimination: Goal: Ability to achieve and maintain adequate renal perfusion and functioning will improve Outcome: Progressing

## 2018-10-06 NOTE — Progress Notes (Signed)
CARDIAC REHAB PHASE I   Offered to walk with pt. Pt declining stating she is very nauseous. RN made aware. Encouraged pt to ambulate again later today. Will continue to follow.  [id number] Rufina Falco, RN BSN 10/06/2018 1:37 PM

## 2018-10-06 NOTE — Progress Notes (Signed)
Pt received from 2H. VSS. Telemetry applied. Pt and family oriented to room and unit. Will continue to monitor.  Alp Goldwater, RN  

## 2018-10-06 NOTE — Telephone Encounter (Signed)
LMTCB to discuss upcoming appointment.

## 2018-10-06 NOTE — Telephone Encounter (Signed)
Current admit 

## 2018-10-07 ENCOUNTER — Inpatient Hospital Stay (HOSPITAL_COMMUNITY): Payer: Medicare Other

## 2018-10-07 DIAGNOSIS — Z951 Presence of aortocoronary bypass graft: Secondary | ICD-10-CM

## 2018-10-07 LAB — CBC
HCT: 32.7 % — ABNORMAL LOW (ref 36.0–46.0)
Hemoglobin: 10 g/dL — ABNORMAL LOW (ref 12.0–15.0)
MCH: 27 pg (ref 26.0–34.0)
MCHC: 30.6 g/dL (ref 30.0–36.0)
MCV: 88.1 fL (ref 80.0–100.0)
NRBC: 0.1 % (ref 0.0–0.2)
PLATELETS: 290 10*3/uL (ref 150–400)
RBC: 3.71 MIL/uL — ABNORMAL LOW (ref 3.87–5.11)
RDW: 17.9 % — AB (ref 11.5–15.5)
WBC: 13.5 10*3/uL — AB (ref 4.0–10.5)

## 2018-10-07 LAB — TYPE AND SCREEN
ABO/RH(D): O POS
Antibody Screen: NEGATIVE
UNIT DIVISION: 0
UNIT DIVISION: 0
UNIT DIVISION: 0
Unit division: 0
Unit division: 0
Unit division: 0

## 2018-10-07 LAB — BPAM RBC
BLOOD PRODUCT EXPIRATION DATE: 201911172359
BLOOD PRODUCT EXPIRATION DATE: 201911172359
BLOOD PRODUCT EXPIRATION DATE: 201911172359
BLOOD PRODUCT EXPIRATION DATE: 201911182359
BLOOD PRODUCT EXPIRATION DATE: 201911182359
Blood Product Expiration Date: 201911172359
ISSUE DATE / TIME: 201910160920
ISSUE DATE / TIME: 201910160920
ISSUE DATE / TIME: 201910161137
ISSUE DATE / TIME: 201910161137
UNIT TYPE AND RH: 5100
UNIT TYPE AND RH: 5100
Unit Type and Rh: 5100
Unit Type and Rh: 5100
Unit Type and Rh: 5100
Unit Type and Rh: 5100

## 2018-10-07 LAB — BASIC METABOLIC PANEL
ANION GAP: 9 (ref 5–15)
BUN: 5 mg/dL — ABNORMAL LOW (ref 8–23)
CALCIUM: 9.2 mg/dL (ref 8.9–10.3)
CO2: 29 mmol/L (ref 22–32)
Chloride: 101 mmol/L (ref 98–111)
Creatinine, Ser: 0.64 mg/dL (ref 0.44–1.00)
GFR calc Af Amer: >60 mL/min (ref >60)
Glucose, Bld: 123 mg/dL — ABNORMAL HIGH (ref 70–99)
Potassium: 3.9 mmol/L (ref 3.5–5.1)
Sodium: 139 mmol/L (ref 135–145)

## 2018-10-07 LAB — GLUCOSE, CAPILLARY: Glucose-Capillary: 140 mg/dL — ABNORMAL HIGH (ref 70–99)

## 2018-10-07 MED ORDER — IPRATROPIUM-ALBUTEROL 0.5-2.5 (3) MG/3ML IN SOLN: 3.0000 mL | Freq: Two times a day (BID) | RESPIRATORY_TRACT | Status: DC

## 2018-10-07 MED ORDER — IPRATROPIUM-ALBUTEROL 0.5-2.5 (3) MG/3ML IN SOLN
3.0000 mL | Freq: Four times a day (QID) | RESPIRATORY_TRACT | Status: DC
  Administered 2018-10-07: 3 mL via RESPIRATORY_TRACT
  Filled 2018-10-07: qty 3

## 2018-10-07 MED ORDER — IPRATROPIUM-ALBUTEROL 0.5-2.5 (3) MG/3ML IN SOLN
3.0000 mL | RESPIRATORY_TRACT | Status: DC | PRN
  Administered 2018-10-07 – 2018-10-09 (×2): 3 mL via RESPIRATORY_TRACT
  Filled 2018-10-07 (×2): qty 3

## 2018-10-07 MED ORDER — BISOPROLOL FUMARATE 5 MG PO TABS
10.0000 mg | ORAL_TABLET | Freq: Every day | ORAL | Status: DC
  Administered 2018-10-08 – 2018-10-09 (×2): 10 mg via ORAL
  Filled 2018-10-07 (×2): qty 2

## 2018-10-07 MED ORDER — METOCLOPRAMIDE HCL 5 MG/ML IJ SOLN
10.0000 mg | Freq: Four times a day (QID) | INTRAMUSCULAR | Status: AC
  Administered 2018-10-07 (×3): 10 mg via INTRAVENOUS
  Filled 2018-10-07 (×3): qty 2

## 2018-10-07 NOTE — Progress Notes (Addendum)
      [address]       [place] [address]             [phone]      3 Days Post-Op Procedure(s) (LRB): CORONARY ARTERY BYPASS GRAFTING (CABG) times 2 using left  Internal mammary artery to LAD, left greater saphenous vein - open harvest. (N/A) TRANSESOPHAGEAL ECHOCARDIOGRAM (TEE) (N/A)   Subjective:  Patient doing okay.  She did not walk with staff yesterday due to nausea.  I educated the patient on the importance of ambulation several times per day.  She is willing to try today.  No BM  Objective: Vital signs in last 24 hours: Temp:  [98.2 F (36.8 C)-101 F (38.3 C)] 98.4 F (36.9 C) (10/19 0419) Pulse Rate:  [85-103] 103 (10/19 0806) Cardiac Rhythm: Normal sinus rhythm;Sinus tachycardia (10/19 0806) Resp:  [16-27] 22 (10/19 0806) BP: (111-131)/(52-80) 122/71 (10/19 0806) SpO2:  [80 %-98 %] 94 % (10/19 0806) Weight:  [57.7 kg] 57.7 kg (10/19 0419)  Intake/Output from previous day: 10/18 0701 - 10/19 0700 In: 1239.5 [P.O.:1201; I.V.:38.5] Out: -   General appearance: alert, cooperative and no distress Heart: regular rate and rhythm Lungs: diminished breath sounds bibasilar Abdomen: soft, non-tender; bowel sounds normal; no masses,  no organomegaly Extremities: extremities normal, atraumatic, no cyanosis or edema Wound: clean and dry, + ecchymosis LLE  Lab Results: Recent Labs    10/06/18 0338 10/07/18 0425  WBC 12.6* 13.5*  HGB 9.0* 10.0*  HCT 29.5* 32.7*  PLT 239 290   BMET:  Recent Labs    10/06/18 0338 10/07/18 0425  NA 133* 139  K 3.9 3.9  CL 101 101  CO2 24 29  GLUCOSE 113* 123*  BUN <5* 5*  CREATININE 0.53 0.64  CALCIUM 9.0 9.2    PT/INR:  Recent Labs    10/04/18 1415  LABPROT 17.1*  INR 1.41   ABG    Component Value Date/Time   PHART 7.336 (L) 10/04/2018 1925   HCO3 25.4 10/04/2018 1925   TCO2 24 10/05/2018 1534   ACIDBASEDEF 1.0 10/04/2018 1925   O2SAT 97.0 10/04/2018 1925   CBG (last 3)  Recent Labs   10/06/18 1659 10/06/18 2059 10/07/18 0615  GLUCAP 110* 117* 140*    Assessment/Plan: S/P Procedure(s) (LRB): CORONARY ARTERY BYPASS GRAFTING (CABG) times 2 using left  Internal mammary artery to LAD, left greater saphenous vein - open harvest. (N/A) TRANSESOPHAGEAL ECHOCARDIOGRAM (TEE) (N/A)  1. CV- Sinus Tach with PVCS- will increase home Bisoprolol to 10 mg daily 2. Pulm- worsening atelectasis in right base, nebulizers were stopped, will reorder Duonebs, continue inhalers 3. Renal- creatinine has been stable, continue Lasix, potassium 4. GI- nausea, improved, has not yet moved bowels, will give Reglan for 4 doses 5. CBGs controlled, d/c SSIP and CBGS 6. Dispo- patient stable, increase BB, will restart home Duonebs, continue inhalers with for underlying asthma, continue diuretics, needs to walk, continue current care   LOS: 9 days    Erin Barrett 10/07/2018  I have seen and examined the patient and agree with the assessment and plan as outlined.  Looks quite good and reports feeling better.  Just walked.  Rexene Alberts, MD 10/07/2018 11:16 AM

## 2018-10-07 NOTE — Progress Notes (Signed)
Patient ambulated in hallway with nursing staff and rolling walker. Back in room in bed call bell with in reach will monitor patient. Margeart Allender, Bettina Gavia RN

## 2018-10-07 NOTE — Progress Notes (Signed)
Encouraged patient ambulation in hallway patient declined at this time. Will continue to encourage. Kaysa Roulhac, Bettina Gavia RN

## 2018-10-07 NOTE — Progress Notes (Signed)
Patient did not want to walk this morning. Will walk later.

## 2018-10-07 NOTE — Progress Notes (Signed)
CARDIAC REHAB PHASE I   PRE:  Rate/Rhythm: 109 ST  BP:  Sitting: 119/84      SaO2: 95 RA  MODE:  Ambulation: 350 ft 119 peak HR  POST:  Rate/Rhythm: 105 ST  BP:  Sitting: 97/74    SaO2: 96 RA   Pt ambulated 367ft in hallway assist of one with front wheel walker. Pt denies SOB or pain. Feels improvement compared to walks yesterday. Pt returned to recliner, call bell and phone within reach. Will continue to follow.  [phone] Rufina Falco, RN BSN 10/07/2018 10:50 AM

## 2018-10-08 MED ORDER — INFLUENZA VAC SPLIT HIGH-DOSE 0.5 ML IM SUSY
0.5000 mL | PREFILLED_SYRINGE | INTRAMUSCULAR | Status: AC
  Administered 2018-10-09: 0.5 mL via INTRAMUSCULAR
  Filled 2018-10-08 (×2): qty 0.5

## 2018-10-08 NOTE — Progress Notes (Addendum)
      [address]       [place] [address]             [phone]      4 Days Post-Op Procedure(s) (LRB): CORONARY ARTERY BYPASS GRAFTING (CABG) times 2 using left  Internal mammary artery to LAD, left greater saphenous vein - open harvest. (N/A) TRANSESOPHAGEAL ECHOCARDIOGRAM (TEE) (N/A)   Subjective:  No new complaints.  Feeling better.  She ambulated yesterday.  + BM  Objective: Vital signs in last 24 hours: Temp:  [98.2 F (36.8 C)-98.5 F (36.9 C)] 98.5 F (36.9 C) (10/20 0330) Pulse Rate:  [96-110] 98 (10/20 0330) Cardiac Rhythm: Normal sinus rhythm;Bundle branch block (10/20 0700) Resp:  [16-30] 30 (10/20 0447) BP: (109-131)/(74-79) 131/79 (10/20 0330) SpO2:  [92 %-96 %] 96 % (10/20 0330) Weight:  [57.5 kg] 57.5 kg (10/20 0447)  Intake/Output from previous day: 10/19 0701 - 10/20 0700 In: 1620 [P.O.:1620] Out: -   General appearance: alert, cooperative and no distress Heart: regular rate and rhythm Lungs: clear to auscultation bilaterally Abdomen: soft, non-tender; bowel sounds normal; no masses,  no organomegaly Extremities: edema trace Wound: ecchymosis LLE  Lab Results: Recent Labs    10/06/18 0338 10/07/18 0425  WBC 12.6* 13.5*  HGB 9.0* 10.0*  HCT 29.5* 32.7*  PLT 239 290   BMET:  Recent Labs    10/06/18 0338 10/07/18 0425  NA 133* 139  K 3.9 3.9  CL 101 101  CO2 24 29  GLUCOSE 113* 123*  BUN <5* 5*  CREATININE 0.53 0.64  CALCIUM 9.0 9.2    PT/INR: No results for input(s): LABPROT, INR in the last 72 hours. ABG    Component Value Date/Time   PHART 7.336 (L) 10/04/2018 1925   HCO3 25.4 10/04/2018 1925   TCO2 24 10/05/2018 1534   ACIDBASEDEF 1.0 10/04/2018 1925   O2SAT 97.0 10/04/2018 1925   CBG (last 3)  Recent Labs    10/06/18 1659 10/06/18 2059 10/07/18 0615  GLUCAP 110* 117* 140*    Assessment/Plan: S/P Procedure(s) (LRB): CORONARY ARTERY BYPASS GRAFTING (CABG) times 2 using left  Internal mammary  artery to LAD, left greater saphenous vein - open harvest. (N/A) TRANSESOPHAGEAL ECHOCARDIOGRAM (TEE) (N/A)  1. CV- NSR with PVC- continue Bisoprolol at 10 mg daily 2. Pulm- no acute issues, continue IS 3. Renal- creatinine WNL, weight is stable, continue Lasix 40 mg daily 4. GI- nausea resolved, reglan doses completed, she has moved her bowels 5. Deconditioning- patient getting assistance from neighbor at discharge, will make H/H arrangements 6. Dispo- patient stable, will d/c EPW today, nausea issues resolved, continue to ambulate possibly ready for d/c in 24-48 hours   LOS: 10 days    Erin Barrett 10/08/2018  I have seen and examined the patient and agree with the assessment and plan as outlined.  Rexene Alberts, MD 10/08/2018 6:30 PM

## 2018-10-08 NOTE — Progress Notes (Signed)
10/08/2018 10:20 AM  EPW D/C'd per order and per protocol.  Ends intact. Pt. Tolerated well.  Advised bedrest x1hr.  Call bell in reach.  Vital signs collected per protocol. Carney Corners

## 2018-10-09 ENCOUNTER — Ambulatory Visit: Payer: Medicare Other | Admitting: Adult Health

## 2018-10-09 MED ORDER — TRAMADOL HCL 50 MG PO TABS: 50.0000 mg | ORAL_TABLET | Freq: Four times a day (QID) | ORAL | 0 refills | Status: AC | PRN

## 2018-10-09 MED ORDER — ACETAMINOPHEN 500 MG PO TABS: 500.0000 mg | ORAL_TABLET | Freq: Four times a day (QID) | ORAL | 0 refills | Status: AC | PRN

## 2018-10-09 MED ORDER — CLOPIDOGREL BISULFATE 75 MG PO TABS: 75.0000 mg | ORAL_TABLET | Freq: Every day | ORAL | 1 refills | Status: DC

## 2018-10-09 MED FILL — traMADol HCL 50 MG TABS: 50 | 5 days supply | Qty: 30 | Fill #0

## 2018-10-09 MED FILL — CLOPIDOGREL 75 MG TABLET: 75 | 30 days supply | Qty: 30 | Fill #0 | Status: TO

## 2018-10-09 NOTE — Progress Notes (Signed)
[address]       [place] [address]             [phone]      5 Days Post-Op Procedure(s) (LRB): CORONARY ARTERY BYPASS GRAFTING (CABG) times 2 using left  Internal mammary artery to LAD, left greater saphenous vein - open harvest. (N/A) TRANSESOPHAGEAL ECHOCARDIOGRAM (TEE) (N/A) Subjective: Feels ok, some mild cough/sputum- feels breathing status is stable for her( asthma)  Objective: Vital signs in last 24 hours: Temp:  [97.9 F (36.6 C)-98 F (36.7 C)] 97.9 F (36.6 C) (10/21 0355) Pulse Rate:  [88-98] 88 (10/21 0355) Cardiac Rhythm: Normal sinus rhythm;Sinus tachycardia (10/21 0355) Resp:  [17-34] 17 (10/21 0355) BP: (132-139)/(65-79) 139/79 (10/21 0355) SpO2:  [94 %-98 %] 98 % (10/21 0355) Weight:  [57.4 kg] 57.4 kg (10/21 0416)  Hemodynamic parameters for last 24 hours:    Intake/Output from previous day: 10/20 0701 - 10/21 0700 In: 483 [P.O.:480; I.V.:3] Out: -  Intake/Output this shift: No intake/output data recorded.  General appearance: alert, cooperative and no distress Heart: regular rate and rhythm Lungs: minor ronchi/wheeze, improves with cough Abdomen: benign Extremities: trace edema Wound: incis healing well  Lab Results: Recent Labs    10/07/18 0425  WBC 13.5*  HGB 10.0*  HCT 32.7*  PLT 290   BMET:  Recent Labs    10/07/18 0425  NA 139  K 3.9  CL 101  CO2 29  GLUCOSE 123*  BUN 5*  CREATININE 0.64  CALCIUM 9.2    PT/INR: No results for input(s): LABPROT, INR in the last 72 hours. ABG    Component Value Date/Time   PHART 7.336 (L) 10/04/2018 1925   HCO3 25.4 10/04/2018 1925   TCO2 24 10/05/2018 1534   ACIDBASEDEF 1.0 10/04/2018 1925   O2SAT 97.0 10/04/2018 1925   CBG (last 3)  Recent Labs    10/06/18 1659 10/06/18 2059 10/07/18 0615  GLUCAP 110* 117* 140*    Meds Scheduled Meds: . acetaminophen  1,000 mg Oral Q6H   Or  . acetaminophen (TYLENOL) oral liquid 160 mg/5 mL  1,000 mg Per Tube  Q6H  . atorvastatin  80 mg Oral q1800  . bisacodyl  10 mg Oral Daily   Or  . bisacodyl  10 mg Rectal Daily  . bisoprolol  10 mg Oral Daily  . chlorhexidine  15 mL Mouth Rinse BID  . clopidogrel  75 mg Oral Daily  . docusate sodium  200 mg Oral Daily  . enoxaparin (LOVENOX) injection  40 mg Subcutaneous QHS  . ezetimibe  10 mg Oral Daily  . furosemide  40 mg Oral Daily  . Influenza vac split quadrivalent PF  0.5 mL Intramuscular Tomorrow-1000  . mouth rinse  15 mL Mouth Rinse q12n4p  . mometasone-formoterol  2 puff Inhalation BID  . montelukast  10 mg Oral QHS  . multivitamin with minerals  1 tablet Oral Daily  . potassium chloride  20 mEq Oral Daily  . sodium chloride flush  3 mL Intravenous Q12H   Continuous Infusions: . sodium chloride     PRN Meds:.sodium chloride, alum & mag hydroxide-simeth, fluticasone, ipratropium-albuterol, magnesium hydroxide, meclizine, metoprolol tartrate, sodium chloride flush, traMADol  Xrays Dg Chest 2 View  Result Date: 10/07/2018 CLINICAL DATA:  77 year old female with a history of CABG EXAM: CHEST - 2 VIEW COMPARISON:  10/06/2018 FINDINGS: Cardiomediastinal silhouette unchanged in size and contour. Surgical changes of median sternotomy and CABG. Blunting of  the bilateral costophrenic angle with right greater than left opacity obscuring the hemidiaphragm. Meniscus on the lateral view. No pneumothorax. Epicardial pacing leads in the upper abdomen. Interval removal of right IJ sheath. No displaced fracture. IMPRESSION: Small right greater than left pleural effusion with associated atelectasis. No evidence of edema. Surgical changes of median sternotomy and CABG. Interval removal of right IJ sheath, with epicardial pacing leads persisting. Electronically Signed   By: Corrie Mckusick D.O.   On: 10/07/2018 08:45    Assessment/Plan: S/P Procedure(s) (LRB): CORONARY ARTERY BYPASS GRAFTING (CABG) times 2 using left  Internal mammary artery to LAD, left greater  saphenous vein - open harvest. 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

## 2018-10-09 NOTE — Telephone Encounter (Signed)
Current admit 

## 2018-10-09 NOTE — Progress Notes (Signed)
10/09/2018 12:10 PM Discharge AVS meds taken today and those due this evening reviewed.  Follow-up appointments and when to call md reviewed.  D/C IV and TELE.  Questions and concerns addressed.   D/C home per orders. Carney Corners

## 2018-10-09 NOTE — Progress Notes (Signed)
10/09/2018 1000 Chest tube sutures removed without difficulty.   Carney Corners

## 2018-10-09 NOTE — Care Management Important Message (Signed)
Important Message  Patient Details  Name: Cassie Campbell MRN: [id number] Date of Birth: 1941-04-29   Medicare Important Message Given:  Yes    Charlotta Lapaglia P Vinh Sachs 10/09/2018, 1:40 PM

## 2018-10-09 NOTE — Progress Notes (Signed)
   10/09/18 1100  Clinical Encounter Type  Visited With Patient and family together  Visit Type Initial  Referral From Other (Comment)  Spiritual Encounters  Spiritual Needs Emotional  Stress Factors  Patient Stress Factors Other (Comment)  Family Stress Factors Other (Comment)   While doing rounds on floor PT and family asked me to enter. PT was getting ready to be discharged. I offered spiritual care with ministry of presence and words of encouragement.   Chaplain Fidel Levy  [phone]

## 2018-10-09 NOTE — Progress Notes (Signed)
CARDIAC REHAB PHASE I   PRE:  Rate/Rhythm: 98 SR  BP:  Supine:   Sitting: 134/84  Standing:    SaO2: 95%RA  MODE:  Ambulation: 350 ft   POST:  Rate/Rhythm: 118 ST  BP:  Supine:   Sitting: 150/83  Standing:    SaO2: 95%RA 0820-0905 Pt walked 350 ft on RA with rolling walker with steady gait. Walked beside for safety. Stopped once to rest. Education completed with pt who voiced understanding. Discussed staying in the tube, IS, ex ed and heart healthy diet. Discussed CRP 2 and referred to [place]. Pt does not think she needs walker for home use. Pt stated she has seen discharge video.   Graylon Good, RN BSN  10/09/2018 8:59 AM

## 2018-10-09 NOTE — Care Management Note (Signed)
Case Management Note Original Note Created   Bethena Roys, RN 10/03/2018, 10:30 AM   Patient Details  Name: Cassie Campbell MRN: [id number] Date of Birth: March 28, 1941  Subjective/Objective: Pt presented for Chest Pain- post cath plan for CABG 10-04-18. PTA Independent from home alone. Patient has support of neighbor and her POA is her best friend. Pt states she will probably go to her neighbors post procedure.      Action/Plan: CM will continue to monitor for additional needs post procedure.   Expected Discharge Date:  10/09/18               Expected Discharge Plan:  [place]  In-House Referral:  NA  Discharge planning Services  CM Consult  Post Acute Care Choice:  Home Health Choice offered to:  Patient  DME Arranged:  N/A DME Agency:  NA  HH Arranged:  RN [place] Agency:  [facility] [employer]  Status of Service:  Completed, signed off  If discussed at [employer], dates discussed:    Discharge Disposition: home/home health   Additional Comments:  10/09/18- 1020- Raymona Boss RN, CM- pt for transition home today, order placed for [facility]- spoke with pt at bedside- choice offered for [facility] agency- per pt she has used [facility] in past would like to use them again for [facility] needs- per pt she does not have any DME needs. Pt is going to have neighbors assist on return home, address, phone # and PCP confirmed in epic. Referral called to [place] with [facility]. They will f/u with pt for [facility] services.   Dawayne Patricia, RN 10/09/2018, 10:18 AM Transitions of Care Unit 4E- RN Case Manager [phone]

## 2018-10-09 NOTE — Progress Notes (Signed)
    CARDIOLOGY RECOMMENDATIONS:  Discharge is anticipated in the next 48 hours. Recommendations for medications and follow up:  Discharge Medications: Continue medications as they are currently listed in the [facility] Note on Plavix monotherapy because of ASA allergy.   Follow Up: The patient's Primary Cardiologist is Shelva Majestic, MD  She has follow-up scheduled with Almyra Deforest, PA on 10/16/2018 on Dr. Evette Georges APP team  Signed,  Candee Furbish, MD  10:30 AM 10/09/2018  [facility]

## 2018-10-10 ENCOUNTER — Telehealth (HOSPITAL_COMMUNITY): Payer: Self-pay

## 2018-10-10 NOTE — Telephone Encounter (Signed)
Patient contacted regarding discharge from [facility] on 10/09/18.  Patient understands to follow up with provider Almyra Deforest, PA on 10/16/18 at 3:00 at [facility]. Patient understands discharge instructions? Yes Patient understands medications and regiment? Yes Patient understands to bring all medications to this visit? Yes

## 2018-10-10 NOTE — Telephone Encounter (Signed)
Pt's insurance is active and benefits verified through [facility] - $20.00 co-pay, no deductible, out of pocket amount of $6,700/$950.71 has been met, no co-insurance, and no pre-authorization is required. Passport/reference [phone]  Will make initial call to pt in regards to Cardiac Rehab. If interested, pt will need to complete follow up appt. Once completed, pt will be contacted for scheduling.

## 2018-10-10 NOTE — Telephone Encounter (Signed)
Attempted to contact pt 2x in regards to Cardiac Rehab - "Call could not be completed at this time".

## 2018-10-11 ENCOUNTER — Telehealth: Payer: Self-pay | Admitting: Cardiovascular Disease

## 2018-10-11 MED FILL — Sodium Bicarbonate IV Soln 8.4%: INTRAVENOUS | Qty: 50 | Status: AC

## 2018-10-11 MED FILL — Lidocaine HCl(Cardiac) IV PF Soln Pref Syr 100 MG/5ML (2%): INTRAVENOUS | Qty: 5 | Status: AC

## 2018-10-11 MED FILL — Heparin Sodium (Porcine) Inj 1000 Unit/ML: INTRAMUSCULAR | Qty: 10 | Status: AC

## 2018-10-11 MED FILL — Mannitol IV Soln 20%: INTRAVENOUS | Qty: 500 | Status: AC

## 2018-10-11 MED FILL — Sodium Chloride IV Soln 0.9%: INTRAVENOUS | Qty: 2000 | Status: AC

## 2018-10-11 MED FILL — Albumin, Human Inj 5%: INTRAVENOUS | Qty: 250 | Status: AC

## 2018-10-11 MED FILL — Electrolyte-R (PH 7.4) Solution: INTRAVENOUS | Qty: 4000 | Status: AC

## 2018-10-11 NOTE — Telephone Encounter (Signed)
New Message    Cassie Campbell is calling from [facility] to obtain orders to be able to see patient in the home post hospital after CABG.  Two week one  Three week one Two week two One week one  3 PRN's.   Please call to discuss.

## 2018-10-11 NOTE — Telephone Encounter (Signed)
Routed to Dr. Claiborne Billings to review and advise on orders

## 2018-10-11 NOTE — Telephone Encounter (Signed)
Can you please ask Claiborne Billings about this... Thanks.

## 2018-10-12 NOTE — Telephone Encounter (Signed)
Okay for home health to see patient

## 2018-10-13 NOTE — Telephone Encounter (Signed)
Attempted to return call to [place] but received error memo that "call cannot be completed as dialed" x3

## 2018-10-16 ENCOUNTER — Ambulatory Visit: Payer: Medicare Other | Admitting: Physician Assistant

## 2018-10-16 ENCOUNTER — Encounter: Payer: Self-pay | Admitting: Physician Assistant

## 2018-10-16 VITALS — BP 142/70 | HR 100 | Ht 59.0 in | Wt 121.6 lb

## 2018-10-16 DIAGNOSIS — I25810 Atherosclerosis of coronary artery bypass graft(s) without angina pectoris: Secondary | ICD-10-CM

## 2018-10-16 DIAGNOSIS — R7303 Prediabetes: Secondary | ICD-10-CM

## 2018-10-16 DIAGNOSIS — I10 Essential (primary) hypertension: Secondary | ICD-10-CM | POA: Diagnosis not present

## 2018-10-16 DIAGNOSIS — E785 Hyperlipidemia, unspecified: Secondary | ICD-10-CM

## 2018-10-16 MED ORDER — BISOPROLOL FUMARATE 10 MG PO TABS: 10.0000 mg | ORAL_TABLET | Freq: Every day | ORAL | 1 refills | Status: DC

## 2018-10-16 NOTE — Progress Notes (Signed)
Cardiology Office Note    Date:  10/16/2018   ID:  Cassie, Campbell [birth year], MRN [id number]  PCP:  Tanda Rockers, MD  Cardiologist:  Dr. Claiborne Billings   Chief Complaint  Patient presents with  . Hospitalization Follow-up    seen for Dr. Claiborne Billings, post cabg    History of Present Illness:  Cassie Campbell is a 77 y.o. female with PMH of CAD, HLD, HTN, LBBB, prediabetes and GERD.  She had unsuccessful attempt at the PCI in October 2015.  She has been doing well on medical therapy since.  She was last seen by Kerin Ransom on 09/26/2018, she complained of chest pain at the time.  Patient was admitted directly from the office.  She ended up having cardiac catheterization on 09/27/2018 which showed a severe three-vessel disease involving LAD, left circumflex and RCA.  EF was 50 to 55%.  Plavix was held and bypass surgery was recommended.  Echocardiogram obtained on the same day showed EF 60 to 65%, grade 1 DD, perforated foramen ovale could not be excluded.  Pre-CABG Doppler showed mild carotid disease bilaterally.  She eventually underwent CABG x2 with LIMA to LAD with endarterectomy and SVG to PDA.  Patient was extubated in the evening of the surgery without difficulty.  Postop, she was restarted on Plavix 75 mg daily.  He was not placed on aspirin due to history of aspirin allergy.  He had normal postop anemia and volume overload requiring diuresis.  Hemoglobin A1c preop was 5.9 which places her in the prediabetes range.  Patient presents today for cardiology office visit.  She is still fatigued.  However, sternal wound is well-healed.  She denies any further anginal symptoms.  She does have some chest soreness and a tingling sensation near the incision area.  Otherwise she has no lower extremity edema, orthopnea or PND.  Her heart rate is borderline elevated.  I will increase her bisoprolol to 10 mg daily.  Yes EKG   Past Medical History:  Diagnosis Date  . Allergic rhinitis   . Arthritis     . Asthma    xolair s 8/05 ?11/07; mastered hfa 12/20/08  . Benign positional vertigo   . CAD (coronary artery disease)    a. 09/2014 NSTEMI s/p LHC with sig 2V dz. dLAD diffusely diseased and not suitable for PCI. unsuccessful RCA PCI d/t heavy calcifications  . Depression   . Dyspnea   . GERD (gastroesophageal reflux disease)   . Heart attack ([place])    09/2014  . Hyperlipidemia    <130 ldl pos fm hx, bp  . Hypertension   . Obesity   . Osteopenia    dexa 08/22/07 AP spine + 1.1, left femur -1.3, right femur -.8; dexa 10/06/09 +1.6, left femur =1.6, right femur -.  . PONV (postoperative nausea and vomiting)   . Ruptured disc, cervical   . 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 BREAST SURGERY  10-26-10   Rt. breast bx--for microcalcifications in rt. retroareolar region--dx was hyalinized fibroadenoma  . BUNIONECTOMY Bilateral   . CARDIAC CATHETERIZATION     2015  . CATARACT EXTRACTION Right 02/02/2018   Dr Satira Sark  . CATARACT EXTRACTION Left 03/30/2018  . [place] SUR[facility]ERY  12/01  . CORONARY ARTERY BYPASS GRAFT N/A 10/04/2018   Procedure: CORONARY ARTERY BYPASS GRAFTING (CABG) times 2 using left  Internal mammary artery to LAD, left  greater saphenous vein - open harvest.;  Surgeon: [name], MD;  Location: [facility];  Service: Open Heart Surgery;  Laterality: N/A;  . LEFT HEART CATH AND CORONARY ANGIOGRAPHY N/A 09/27/2018   Procedure: LEFT HEART CATH AND CORONARY ANGIOGRAPHY;  Surgeon: Troy Sine, MD;  Location: [facility];  Service: Cardiovascular;  Laterality: N/A;  . LEFT HEART CATHETERIZATION WITH CORONARY ANGIOGRAM N/A 10/16/2014   Procedure: LEFT HEART CATHETERIZATION WITH CORONARY ANGIOGRAM;  Surgeon: Blane Ohara, MD;  Location: [facility] CATH LAB;  Service: Cardiovascular;  Laterality: N/A;  . PERCUTANEOUS CORONARY ROTOBLATOR INTERVENTION (PCI-R) N/A 10/17/2014    Procedure: PERCUTANEOUS CORONARY ROTOBLATOR INTERVENTION (PCI-R);  Surgeon: Troy Sine, MD;  Location: [facility] CATH LAB;  Service: Cardiovascular;  Laterality: N/A;  . TEE WITHOUT CARDIOVERSION N/A 10/04/2018   Procedure: TRANSESOPHAGEAL ECHOCARDIOGRAM (TEE);  Surgeon: [name], MD;  Location: [facility];  Service: Open Heart Surgery;  Laterality: N/A;  . VEIN LIGATION AND STRIPPING    . VIDEO BRONCHOSCOPY Bilateral 02/05/2015   Procedure: VIDEO BRONCHOSCOPY WITHOUT FLUORO;  Surgeon: Tanda Rockers, MD;  Location: [facility];  Service: Endoscopy;  Laterality: Bilateral;  . VULVA /PERINEUM BIOPSY  12-30-10   --epidermoid cyst    Current Medications: Outpatient Medications Prior to Visit  Medication Sig Dispense Refill  . atorvastatin (LIPITOR) 80 MG tablet TAKE 1 TABLET(80 MG) BY MOUTH DAILY 90 tablet 1  . cetirizine (ZYRTEC) 10 MG tablet Take 10 mg by mouth at bedtime as needed (itching and sneezing).     . cholecalciferol 2000 units TABS Take 1 tablet (2,000 Units total) by mouth daily. 30 tablet 3  . clopidogrel (PLAVIX) 75 MG tablet Take 1 tablet (75 mg total) by mouth daily. 30 tablet 1  . clotrimazole-betamethasone (LOTRISONE) cream Apply 1 application topically as needed (itching). Use as directed for rash    . dextromethorphan-guaiFENesin (MUCINEX DM) 30-600 MG per 12 hr tablet Take 1-2 tablets by mouth every 12 (twelve) hours as needed for cough (with flutter).     . fluticasone (FLONASE) 50 MCG/ACT nasal spray Place 2 sprays into both nostrils 2 (two) times daily as needed for allergies or rhinitis.     Marland Kitchen meclizine (ANTIVERT) 25 MG tablet TAKE 2 TABLETS BY MOUTH THREE TIMES DAILY AS NEEDED (Patient taking differently: TAKE 2 TABLETS BY MOUTH THREE TIMES DAILY AS NEEDED FOR DIZZNESS) 24 tablet 0  . montelukast (SINGULAIR) 10 MG tablet TAKE 1 TABLET(10 MG) BY MOUTH AT BEDTIME 90 tablet 0  . Multiple Vitamin (MULTIVITAMIN) capsule Take 1 capsule by mouth daily.     Marland Kitchen oxymetazoline  (AFRIN) 0.05 % nasal spray Place 2 sprays into both nostrils 2 (two) times daily as needed for congestion. as needed for stuffy nose and sinus problems    . PROAIR HFA 108 (90 Base) MCG/ACT inhaler INHALE 1 TO 2 PUFFS BY MOUTH EVERY 4 HOURS AS NEEDED FOR WHEEZING 8.5 g 5  . SYMBICORT 160-4.5 MCG/ACT inhaler INHALE 2 PUFFS BY MOUTH EVERY 12 HOURS 10.2 g 0  . bisoprolol (ZEBETA) 5 MG tablet TAKE 1 TABLET(5 MG) BY MOUTH DAILY 90 tablet 0   No facility-administered medications prior to visit.      Allergies:   Fish-derived products; Penicillins; Aspirin; Brilinta [ticagrelor]; Codeine phosphate; and Diphenhydramine hcl   Social History   Socioeconomic History  . Marital status: Married    Spouse name: Not on file  . Number of children: Not on file  . Years of education: Not  on file  . Highest education level: Not on file  Occupational History  . Occupation: retired Tour manager  Social Needs  . Financial resource strain: Not on file  . Food insecurity:    Worry: Not on file    Inability: Not on file  . Transportation needs:    Medical: Not on file    Non-medical: Not on file  Tobacco Use  . Smoking status: Never Smoker  . Smokeless tobacco: Never Used  Substance and Sexual Activity  . Alcohol use: No    Alcohol/week: 0.0 standard drinks  . Drug use: No  . Sexual activity: Not Currently    Partners: Male    Birth control/protection: Post-menopausal  Lifestyle  . Physical activity:    Days per week: Not on file    Minutes per session: Not on file  . Stress: Not on file  Relationships  . Social connections:    Talks on phone: Not on file    Gets together: Not on file    Attends religious service: Not on file    Active member of club or organization: Not on file    Attends meetings of clubs or organizations: Not on file    Relationship status: Not on file  Other Topics Concern  . Not on file  Social History Narrative  . Not on file     Family History:  The  patient's family history includes Breast cancer in her sister; Breast cancer (age of onset: 23) in her daughter; Diabetes in her mother and sister; Heart disease in her father; Hypertension in her mother and sister; Stroke in her mother.   ROS:   Please see the history of present illness.    ROS All other systems reviewed and are negative.   PHYSICAL EXAM:   VS:  BP (!) 142/70   Pulse 100   Ht 4\' 11"  (1.499 m)   Wt 121 lb 9.6 oz (55.2 kg)   LMP 12/21/1999   BMI 24.56 kg/m    GEN: Well nourished, well developed, in no acute distress  HEENT: normal  Neck: no JVD, carotid bruits, or masses Cardiac: RRR; no murmurs, rubs, or gallops,no edema  Respiratory:  clear to auscultation bilaterally, normal work of breathing GI: soft, nontender, nondistended, + BS MS: no deformity or atrophy  Skin: warm and dry, no rash Neuro:  Alert and Oriented x 3, Strength and sensation are intact Psych: euthymic mood, full affect  Wt Readings from Last 3 Encounters:  10/16/18 121 lb 9.6 oz (55.2 kg)  10/09/18 126 lb 8 oz (57.4 kg)  09/26/18 125 lb 8 oz (56.9 kg)      Studies/Labs Reviewed:   EKG:  EKG is ordered today.  The ekg ordered today demonstrates normal sinus rhythm, right bundle branch block.  Recent Labs: 03/09/2018: TSH 2.14 09/26/2018: ALT 20 10/05/2018: Magnesium 2.1 10/07/2018: BUN 5; Creatinine, Ser 0.64; Hemoglobin 10.0; Platelets 290; Potassium 3.9; Sodium 139   Lipid Panel    Component Value Date/Time   CHOL 131 03/09/2018 0909   TRIG 109.0 03/09/2018 0909   TRIG 85 10/05/2006 1022   HDL 46.50 03/09/2018 0909   CHOLHDL 3 03/09/2018 0909   VLDL 21.8 03/09/2018 0909   LDLCALC 63 03/09/2018 0909    Additional studies/ records that were reviewed today include:   Cath 09/27/2018  Acute Mrg lesion is 95% stenosed.  Mid RCA to Dist RCA lesion is 100% stenosed.  Prox RCA to Mid RCA lesion is 50% stenosed.  Mid RCA lesion is 80% stenosed.  Mid LM to Dist LM lesion is  25% stenosed.  Ost LAD lesion is 40% stenosed.  Prox LAD-1 lesion is 95% stenosed.  Prox LAD-2 lesion is 50% stenosed.  Mid LAD-1 lesion is 90% stenosed.  Mid LAD-2 lesion is 80% stenosed.  Dist LAD lesion is 80% stenosed.  The left ventricular ejection fraction is 50-55% by visual estimate.  LV end diastolic pressure is low.   Severe coronary calcification involving the LAD, circumflex, and RCA.  There is smooth 20 to 25% narrowing in the distal left main.  The LAD has 40% proximal calcified stenosis.  After the first septal perforating artery and be in the region of the first diagonal vessel the LAD is severely calcified with 95% stenosis.  There is diffuse disease in the mid segment with 50% narrowing followed by 90% stenosis with distal 80% stenoses.  Calcified left circumflex vessel without high-grade obstructive disease.  Calcified RCA with 50% proximal 80% mid and total occlusion prior to the acute margin.  There is extensive left-to-right collateralization to the distal RCA via the left circulation.  Low normal global LV function with an ejection fraction at 50 to 55%.  There is small focal region of mild mid anterolateral posterior basal inferior hypo-contractility.  LVEDP  8 mm Hg.  RECOMMENDATION: Patient has severe coronary calcification.  Will review with colleagues.  Recommend surgical consultation for consideration of CABG revascularization surgery.  If patient is turned down surgically, consider atherectomy of proximal to mid LAD but there is diffuse distal LAD disease.  Will increase medical regimen and maximize ranolazine to 1000 mg twice a day.  Will hold Plavix but if turned down for surgery resume DAPT. Aggressive lipid-lowering therapy with target LDL less than 70.   Echo 09/27/2018 LV EF: 60% -   65% Study Conclusions  - Left ventricle: The cavity size was normal. Systolic function was   normal. The estimated ejection fraction was in the range of  60%   to 65%. Wall motion was normal; there were no regional wall   motion abnormalities. There was an increased relative   contribution of atrial contraction to ventricular filling.   Doppler parameters are consistent with abnormal left ventricular   relaxation (grade 1 diastolic dysfunction). - Atrial septum: A patent foramen ovale cannot be excluded. - Pulmonary arteries: Systolic pressure could not be accurately   estimated. - Recommendations: Agitated saline contrast study to rule out PFO.  Recommendations:  Agitated saline contrast study to rule out PFO.    ASSESSMENT:    1. Coronary artery disease involving coronary bypass graft of native heart without angina pectoris   2. Hyperlipidemia with target LDL less than 70   3. Essential hypertension   4. Prediabetes      PLAN:  In order of problems listed above:  1. CAD s/p CABG: Continue Plavix, not on aspirin due to allergy.  Denies any chest discomfort.  Sternal wound well-healed.  She can start on cardiac rehab after she is seen by cardiothoracic surgery on follow-up.  2. Hypertension: Blood pressure stable.  Heart rate is borderline elevated, will increase bisoprolol to 10 mg daily.  3. Hyperlipidemia: On Lipitor 80 mg daily.  Previous lab work showed a very well-controlled cholesterol  4. Prediabetes: Followed by primary care provider.  Last hemoglobin A1c was 5.9.    Medication Adjustments/Labs and Tests Ordered: Current medicines are reviewed at length with the patient today.  Concerns regarding medicines are outlined  above.  Medication changes, Labs and Tests ordered today are listed in the Patient Instructions below. Patient Instructions  Medication Instructions:  Increase Bisoprolol to 10 mg daily.  If you need a refill on your cardiac medications before your next appointment, please call your pharmacy.   Lab work: Your physician recommends that you return for a FASTING lipid profile and hepatic function  panel in 3 months.  If you have labs (blood work) drawn today and your tests are completely normal, you will receive your results only by: Marland Kitchen MyChart Message (if you have MyChart) OR . A paper copy in the mail If you have any lab test that is abnormal or we need to change your treatment, we will call you to review the results.  Testing/Procedures: none  Follow-Up: At [facility], you and your health needs are our priority.  As part of our continuing mission to provide you with exceptional heart care, we have created designated Provider Care Teams.  These Care Teams include your primary Cardiologist (physician) and Advanced Practice Providers (APPs -  Physician Assistants and Nurse Practitioners) who all work together to provide you with the care you need, when you need it. You will need a follow up appointment in 3 months with Shelva Majestic, MD or one of the following Advanced Practice Providers on your designated Care Team:  [place], [place] . Fabian Sharp, PA-C  Any Other Special Instructions Will Be Listed Below (If Applicable). none      Hilbert Corrigan, [place]  10/16/2018 5:37 PM    [facility] [address], [place], [place]  [address] Phone: [phone]; Fax: [phone]

## 2018-10-16 NOTE — Patient Instructions (Signed)
Medication Instructions:  Increase Bisoprolol to 10 mg daily.  If you need a refill on your cardiac medications before your next appointment, please call your pharmacy.   Lab work: Your physician recommends that you return for a FASTING lipid profile and hepatic function panel in 3 months.  If you have labs (blood work) drawn today and your tests are completely normal, you will receive your results only by: Marland Kitchen MyChart Message (if you have MyChart) OR . A paper copy in the mail If you have any lab test that is abnormal or we need to change your treatment, we will call you to review the results.  Testing/Procedures: none  Follow-Up: At [facility], you and your health needs are our priority.  As part of our continuing mission to provide you with exceptional heart care, we have created designated Provider Care Teams.  These Care Teams include your primary Cardiologist (physician) and Advanced Practice Providers (APPs -  Physician Assistants and Nurse Practitioners) who all work together to provide you with the care you need, when you need it. You will need a follow up appointment in 3 months with Shelva Majestic, MD or one of the following Advanced Practice Providers on your designated Care Team:  [place], [place] . Fabian Sharp, PA-C  Any Other Special Instructions Will Be Listed Below (If Applicable). none

## 2018-10-18 NOTE — Telephone Encounter (Signed)
Attempt to return call x 2-"call cannot be completed as dialed".

## 2018-10-20 ENCOUNTER — Telehealth (HOSPITAL_COMMUNITY): Payer: Self-pay

## 2018-10-20 NOTE — Telephone Encounter (Signed)
Attempted to call patient in regards to Cardiac Rehab - LM on VM 

## 2018-10-27 ENCOUNTER — Telehealth (HOSPITAL_COMMUNITY): Payer: Self-pay

## 2018-10-27 ENCOUNTER — Other Ambulatory Visit: Payer: Self-pay | Admitting: Internal Medicine

## 2018-10-27 NOTE — Telephone Encounter (Signed)
Called and spoke with patient in regards to Cardiac Rehab - pt is unsure if she would be able to afford Cardiac Rehab. Went over Cardiac Rehab Maintenance and what Cardiac Rehab Maintenance is compared to Phase II and the cost. Patient would like to think on this and follow up next week.

## 2018-10-30 ENCOUNTER — Other Ambulatory Visit: Payer: Self-pay | Admitting: *Deleted

## 2018-10-30 DIAGNOSIS — I2510 Atherosclerotic heart disease of native coronary artery without angina pectoris: Secondary | ICD-10-CM

## 2018-10-31 ENCOUNTER — Encounter: Payer: Self-pay | Admitting: Thoracic Surgery (Cardiothoracic Vascular Surgery)

## 2018-10-31 ENCOUNTER — Other Ambulatory Visit: Payer: Self-pay | Admitting: Thoracic Surgery (Cardiothoracic Vascular Surgery)

## 2018-10-31 ENCOUNTER — Other Ambulatory Visit: Payer: Self-pay | Admitting: *Deleted

## 2018-10-31 ENCOUNTER — Ambulatory Visit (INDEPENDENT_AMBULATORY_CARE_PROVIDER_SITE_OTHER): Payer: Self-pay | Admitting: Thoracic Surgery (Cardiothoracic Vascular Surgery)

## 2018-10-31 ENCOUNTER — Ambulatory Visit
Admission: RE | Admit: 2018-10-31 | Discharge: 2018-10-31 | Disposition: A | Discharge status: Home / Self Care | Payer: Medicare Other | Source: Ambulatory Visit | Attending: Thoracic Surgery (Cardiothoracic Vascular Surgery) | Admitting: Thoracic Surgery (Cardiothoracic Vascular Surgery)

## 2018-10-31 VITALS — BP 124/72 | HR 89 | Resp 18 | Ht 59.0 in | Wt 118.8 lb

## 2018-10-31 DIAGNOSIS — I2510 Atherosclerotic heart disease of native coronary artery without angina pectoris: Secondary | ICD-10-CM

## 2018-10-31 DIAGNOSIS — C801 Malignant (primary) neoplasm, unspecified: Principal | ICD-10-CM

## 2018-10-31 DIAGNOSIS — Z951 Presence of aortocoronary bypass graft: Secondary | ICD-10-CM

## 2018-10-31 DIAGNOSIS — C771 Secondary and unspecified malignant neoplasm of intrathoracic lymph nodes: Secondary | ICD-10-CM

## 2018-10-31 MED ORDER — OXYCODONE HCL 5 MG PO TABS: 5.0000 mg | ORAL_TABLET | Freq: Four times a day (QID) | ORAL | 0 refills | Status: DC | PRN

## 2018-10-31 MED ORDER — CELECOXIB 200 MG PO CAPS: 200.0000 mg | ORAL_CAPSULE | Freq: Two times a day (BID) | ORAL | 1 refills | Status: DC

## 2018-10-31 NOTE — Progress Notes (Signed)
[address]       [name] [address]             [phone]    HPI: Cassie Campbell returns for a scheduled follow-up visit  Cassie Campbell is a 77 year old woman with a history of hypertension, hyperlipidemia, esophageal reflux, pression, asthma, arthritis, and degenerative disc disease.  She presented with unstable angina and was found to have severe two-vessel coronary disease.  She underwent coronary bypass grafting x2 on 10/04/2018 and required an endarterectomy of the LAD.  Postoperatively she did better than expected given the severity of her coronary disease.  On postoperative day #5.  Incidentally during her surgery lymph node was present in the mediastinum.  That was sent for pathology.  Unfortunately it showed metastatic adenocarcinoma.  Incisional pain in her chest.  She complains of pain in her left thigh.  She did have vein harvested from that site but the pain is more on the outside and the inside.  She had been on Celebrex prior to her surgery but was not discharged on that medication.  Tramadol was not effective.  She does not have any chest pain.  Past Medical History:  Diagnosis Date  . Allergic rhinitis   . Arthritis   . Asthma    xolair s 8/05 ?11/07; mastered hfa 12/20/08  . Benign positional vertigo   . CAD (coronary artery disease)    a. 09/2014 NSTEMI s/p LHC with sig 2V dz. dLAD diffusely diseased and not suitable for PCI. unsuccessful RCA PCI d/t heavy calcifications  . Depression   . Dyspnea   . GERD (gastroesophageal reflux disease)   . Heart attack ([place])    09/2014  . Hyperlipidemia    <130 ldl pos fm hx, bp  . Hypertension   . Obesity   . Osteopenia    dexa 08/22/07 AP spine + 1.1, left femur -1.3, right femur -.8; dexa 10/06/09 +1.6, left femur =1.6, right femur -.  . PONV (postoperative nausea and vomiting)   . Ruptured disc, cervical   . 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 atorvastatin (LIPITOR) 80 MG tablet TAKE 1 TABLET(80 MG) BY MOUTH DAILY 90 tablet 1  . bisoprolol (ZEBETA) 10 MG tablet Take 1 tablet (10 mg total) by mouth daily. 90 tablet 1  . cholecalciferol 2000 units TABS Take 1 tablet (2,000 Units total) by mouth daily. 30 tablet 3  . clopidogrel (PLAVIX) 75 MG tablet Take 1 tablet (75 mg total) by mouth daily. 30 tablet 1  . clotrimazole-betamethasone (LOTRISONE) cream Apply 1 application topically as needed (itching). Use as directed for rash    . dextromethorphan-guaiFENesin (MUCINEX DM) 30-600 MG per 12 hr tablet Take 1-2 tablets by mouth every 12 (twelve) hours as needed for cough (with flutter).     . meclizine (ANTIVERT) 25 MG tablet TAKE 2 TABLETS BY MOUTH THREE TIMES DAILY AS NEEDED (Patient taking differently: TAKE 2 TABLETS BY MOUTH THREE TIMES DAILY AS NEEDED FOR DIZZNESS) 24 tablet 0  . montelukast (SINGULAIR) 10 MG tablet TAKE 1 TABLET(10 MG) BY MOUTH AT BEDTIME 90 tablet 0  . Multiple Vitamin (MULTIVITAMIN) capsule Take 1 capsule by mouth daily.     Marland Kitchen PROAIR HFA 108 (90 Base) MCG/ACT inhaler INHALE 1 TO 2 PUFFS BY MOUTH EVERY 4 HOURS AS NEEDED FOR WHEEZING 8.5 g 5  . SYMBICORT 160-4.5 MCG/ACT inhaler  INHALE 2 PUFFS BY MOUTH EVERY 12 HOURS 10.2 g 11  . celecoxib (CELEBREX) 200 MG capsule Take 1 capsule (200 mg total) by mouth 2 (two) times daily. 60 capsule 1  . oxyCODONE (OXY IR/ROXICODONE) 5 MG immediate release tablet Take 1 tablet (5 mg total) by mouth every 6 (six) hours as needed for severe pain. 25 tablet 0   No current facility-administered medications for this visit.     Physical Exam BP 124/72 (BP Location: Right Arm, Patient Position: Sitting, Cuff Size: Normal)   Pulse 89   Resp 18   Ht 4\' 11"  (1.499 m)   Wt 118 lb 12.8 oz (53.9 kg)   LMP 12/21/1999   SpO2 96% Comment: RA  BMI 23.12 kg/m  77 year old woman in no acute distress Alert and oriented x3 with no focal  deficits Lungs clear with equal breath sounds bilaterally Cardiac regular rate and rhythm Sternal incision with minimal erythema at the top sternum stable Leg incision well-healed tender to palpation in the left thigh  Diagnostic Tests: CHEST - 2 VIEW  COMPARISON:  Radiographs of October 07, 2018.  FINDINGS: Stable cardiomediastinal silhouette. No pneumothorax or pleural effusion is noted. Both lungs are clear. The visualized skeletal structures are unremarkable.  IMPRESSION: No active cardiopulmonary disease.   Electronically Signed   By: Marijo Conception, M.D.   On: 10/31/2018 13:36 1. Lymph node, biopsy, mediastinal - LYMPH NODE WITH METASTATIC ADENOCARCINOMA. - SEE MICROSCOPIC DESCRIPTION 2. Plaque, coronary artery - CALCIFIED ATHEROSCLEROTIC PLAQUE. Microscopic Comment 1. The mediastinal lymph node is mostly replaced by metastatic adenocarcinoma associated with necrosis and fibrosis. The adenocarcinoma focally extends into the attached adipose tissue and is characterized by glands and vague papillae with malignant features. Immunohistochemistry shows positivity with cytokeratin 7, PAX-8 and patchy positivity with estrogen receptor. The tumor is negative with cytokeratin 20, TTF-1 and Napsin-A. The differential includes a gynecologic primary. Called to Dr. Roxan Hockey on 10/09/18. (JDP:kh 10/09/18) Claudette Laws MD Pathologist, Electronic Signature  Impression: Cassie Campbell is a 77 year old woman with multiple cardiac risk factors and known coronary disease.  She presented with unstable angina.  She underwent coronary artery bypass grafting x2 with a LAD endarterectomy on 10/04/2018.  Her postoperative course was unremarkable and she went home on day 5.  From a cardiac standpoint she is doing well.  She is not having any anginal pain.  She is not having any incisional pain in her chest.  She does complain of pain in her left thigh.  The pain is primarily lateral and I  suspect due to degenerative disc disease.  She does have a history of that.  She was taking tramadol for that with no relief.  She says it is waking her up at night.  She was taking Celebrex prior to surgery on her restart that.  She also is requesting a stronger narcotics so I am going to give her oxycodone 5 mg p.o. every 6 hours as needed for up to 7 days, 25 tablets, no refills.  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  Stains suggest a possible GYN source.  She has no history of malignancy.  I am going to refer her to oncology and will go ahead and order a PET/CT to see if that will shed light on the primary or any other sites of involvement.  It should help guide her initial diagnostic work-up.  Plan: PET/CT  Referral to oncology  Celebrex and oxycodone for pain  Return in 1 month for follow-up  [name], MD [facility] [phone]

## 2018-11-03 ENCOUNTER — Telehealth (HOSPITAL_COMMUNITY): Payer: Self-pay

## 2018-11-03 NOTE — Telephone Encounter (Signed)
Called to follow up with patient in regards to Cardiac Rehab - pt stated she has been weighing out her options and stated she will not be able to afford either program. Closed referral.

## 2018-11-07 ENCOUNTER — Telehealth: Payer: Self-pay | Admitting: *Deleted

## 2018-11-07 NOTE — Telephone Encounter (Signed)
Called and rescheduled the appt from 11/27 to 11/25.

## 2018-11-13 ENCOUNTER — Encounter: Payer: Self-pay | Admitting: Obstetrics

## 2018-11-13 ENCOUNTER — Ambulatory Visit (HOSPITAL_COMMUNITY)
Admission: RE | Admit: 2018-11-13 | Discharge: 2018-11-13 | Disposition: A | Discharge status: Home / Self Care | Payer: Medicare Other | Source: Ambulatory Visit | Attending: Thoracic Surgery (Cardiothoracic Vascular Surgery) | Admitting: Thoracic Surgery (Cardiothoracic Vascular Surgery)

## 2018-11-13 ENCOUNTER — Inpatient Hospital Stay: Payer: Medicare Other | Attending: Obstetrics

## 2018-11-13 ENCOUNTER — Inpatient Hospital Stay (HOSPITAL_BASED_OUTPATIENT_CLINIC_OR_DEPARTMENT_OTHER): Payer: Medicare Other | Admitting: Obstetrics

## 2018-11-13 VITALS — BP 133/84 | HR 87 | Temp 98.3°F | Resp 18 | Ht 59.0 in | Wt 119.5 lb

## 2018-11-13 DIAGNOSIS — C801 Malignant (primary) neoplasm, unspecified: Secondary | ICD-10-CM

## 2018-11-13 DIAGNOSIS — R599 Enlarged lymph nodes, unspecified: Secondary | ICD-10-CM

## 2018-11-13 DIAGNOSIS — C771 Secondary and unspecified malignant neoplasm of intrathoracic lymph nodes: Secondary | ICD-10-CM | POA: Diagnosis present

## 2018-11-13 DIAGNOSIS — R591 Generalized enlarged lymph nodes: Secondary | ICD-10-CM

## 2018-11-13 DIAGNOSIS — R978 Other abnormal tumor markers: Secondary | ICD-10-CM | POA: Diagnosis not present

## 2018-11-13 DIAGNOSIS — R590 Localized enlarged lymph nodes: Secondary | ICD-10-CM

## 2018-11-13 DIAGNOSIS — C773 Secondary and unspecified malignant neoplasm of axilla and upper limb lymph nodes: Secondary | ICD-10-CM | POA: Diagnosis not present

## 2018-11-13 LAB — BUN & CREATININE (CHCC)
BUN: 9 mg/dL (ref 8–23)
CREATININE: 0.7 mg/dL (ref 0.44–1.00)

## 2018-11-13 LAB — GLUCOSE, CAPILLARY: Glucose-Capillary: 105 mg/dL — ABNORMAL HIGH (ref 70–99)

## 2018-11-13 MED ORDER — FLUDEOXYGLUCOSE F - 18 (FDG) INJECTION
5.8000 | Freq: Once | INTRAVENOUS | Status: AC | PRN
  Administered 2018-11-13: 5.8 via INTRAVENOUS

## 2018-11-13 NOTE — Patient Instructions (Signed)
1. We will order some bloodwork and get a scan 2. Return in one week to review these results and make plan

## 2018-11-13 NOTE — Progress Notes (Signed)
[place] at [facility] Note: New Patient FIRST VISIT   Consult was requested by Dr. 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 Lymphadenopathy  . 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 TBD o .  HPI: Cassie Campbell  is a very nice 77 y.o. P2  She is followed closely by cardiology G and was recently taken by cardiothoracic surgery after being admitted with chest pain to the emergency room.  She had approximately 1 week admission prior to the decision being made for double bypass surgery 10/04/18.  During that procedure lymphadenopathy was noted and a lymph node was removed from the mediastinum.  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  Immunohistochemistry staining shows positive CK 7, PAX 8, and patchy estrogen receptor positivity.  Negative CK 20, TTF-1, and naps and a.  Differential includes a gynecologic primary.  Given these findings she was referred to [place] for management and recommendations by her cardiothoracic surgeon, Dr. Roxan Hockey.  She denies post menopausal bleeding.  She has noticed about a 5 pound weight loss unintentionally in the past 6 months.  She has decreased appetite and early satiety.  She states she has normal bowel movements and normal urination.  She denies pelvic pain.  In the interim between her bypass surgery and her referral here she had a PET scan performed today and is unaware of the results as of yet. PET is showing   Irregular soft tissue lesion involving the wall of the gallbladder fundus and inferior liver markedly hypermetabolic. Primary gallbladder neoplasm is suspected although metastatic liver lesion with gallbladder wall involvement cannot be excluded on this study  Bulky upper abdominal lymphadenopathy with  confluent soft tissue attenuation in the hepato duodenal ligament involving the head of pancreas. This may all reflect lymphadenopathy although pancreatic head mass cannot be excluded   Hypermetabolic lymphadenopathy in the left supraclavicular region, mediastinum, right hilum, and abdomen, compatible with metastatic disease.   Large hypermetabolic bony lesion posterior left pelvis.   Tiny asymmetric focus of increased FDG uptake in the left oropharynx, indeterminate  Imported EPIC Oncologic History:   No history exists.    Measurement of disease:  TBD CEA, CA125, CA19-9 . Marland Kitchen  Radiology: 11/13/2018 - Nm Pet Image Initial (pi) Skull Base To Thigh CLINICAL DATA:  Initial treatment strategy for adenocarcinoma of unknown origin. EXAM: NUCLEAR MEDICINE PET SKULL BASE TO THIGH TECHNIQUE: 5.8 mCi F-18 FDG was injected intravenously. Full-ring PET imaging was performed from the skull base to thigh after the radiotracer. CT data was obtained and used for attenuation correction and anatomic localization. Fasting blood glucose: 105 mg/dl COMPARISON:  None. FINDINGS: Mediastinal blood pool activity: SUV max 1.7 NECK: Trace asymmetric increased uptake noted left oropharynx with SUV max = 3.6 Incidental CT findings: none CHEST: 12 mm short axis left supraclavicular lymph node demonstrates SUV max = 8.4. Hypermetabolic mediastinal lymphadenopathy evident with 11 mm short axis retroesophageal lymph node demonstrating SUV max = 7.9. Uptake in the inferior right hilum suggest lymphadenopathy. And a 11 mm short axis para-aortic node (92/4) demonstrates SUV max = 8.1. Uptake is identified in the region of the median sternotomy, potentially related to prior surgery. Incidental CT findings: Atherosclerotic calcification is noted in the wall of the thoracic aorta. Coronary artery calcification is evident. Pericardial calcification evident the along the left  ventricle. Atelectasis noted along the right major fissure. No  pleural effusion. ABDOMEN/PELVIS: 3.4 x 4.2 cm lesion along the fundal wall of the gallbladder is markedly hypermetabolic with SUV max = 10.6. Bulky upper abdominal lymphadenopathy is hypermetabolic. 20 mm short axis left para-aortic lymph node demonstrates SUV max = 14.1. Lymphadenopathy is identified in the hepato duodenal ligament which blends imperceptibly with the head of pancreas on this noncontrast study. Hypermetabolism in this region demonstrates SUV max = 13.6. Incidental CT findings: There is abdominal aortic atherosclerosis without aneurysm. Tiny layering stones are seen in the gallbladder lumen. SKELETON: 3.9 x 4.2 cm destructive mass posterior left acetabulum and ischial tuberosity demonstrates SUV max = 20.6. Incidental CT findings: None. IMPRESSION: 1. Irregular soft tissue lesion involving the wall of the gallbladder fundus and inferior liver is markedly hypermetabolic. Primary gallbladder neoplasm is suspected although metastatic liver lesion with gallbladder wall involvement cannot be excluded on this study. 2. Bulky upper abdominal lymphadenopathy with confluent soft tissue attenuation in the hepato duodenal ligament involving the head of pancreas. This may all reflect lymphadenopathy although pancreatic head mass cannot be excluded on the noncontrast CT images obtained for attenuation correction. Contrast infused CT scan of the abdomen and pelvis is recommended to further evaluate as primary pancreatic adenocarcinoma cannot be excluded. If the patient is able to reproducibly breath hold, MRI of the abdomen without and with contrast could be considered. 3. Hypermetabolic lymphadenopathy in the left supraclavicular region, mediastinum, right hilum, and abdomen, compatible with metastatic disease. 4. Large hypermetabolic bony lesion posterior left pelvis. 5. Tiny asymmetric focus of increased FDG uptake in the left oropharynx, indeterminate. 6. Cholelithiasis. 7.  Aortic Atherosclerois  (ICD10-170.0) Electronically Signed   By: Misty Stanley M.D.   On: 11/13/2018 13:12  .  Marland Kitchen   Outpatient Encounter Medications as of 11/13/2018  Medication Sig  . atorvastatin (LIPITOR) 80 MG tablet TAKE 1 TABLET(80 MG) BY MOUTH DAILY  . bisoprolol (ZEBETA) 10 MG tablet Take 1 tablet (10 mg total) by mouth daily.  . celecoxib (CELEBREX) 200 MG capsule Take 1 capsule (200 mg total) by mouth 2 (two) times daily.  . cholecalciferol 2000 units TABS Take 1 tablet (2,000 Units total) by mouth daily.  . clopidogrel (PLAVIX) 75 MG tablet Take 1 tablet (75 mg total) by mouth daily.  . clotrimazole-betamethasone (LOTRISONE) cream Apply 1 application topically as needed (itching). Use as directed for rash  . dextromethorphan-guaiFENesin (MUCINEX DM) 30-600 MG per 12 hr tablet Take 1-2 tablets by mouth every 12 (twelve) hours as needed for cough (with flutter).   . meclizine (ANTIVERT) 25 MG tablet TAKE 2 TABLETS BY MOUTH THREE TIMES DAILY AS NEEDED (Patient taking differently: TAKE 2 TABLETS BY MOUTH THREE TIMES DAILY AS NEEDED FOR DIZZNESS)  . montelukast (SINGULAIR) 10 MG tablet TAKE 1 TABLET(10 MG) BY MOUTH AT BEDTIME  . Multiple Vitamin (MULTIVITAMIN) capsule Take 1 capsule by mouth daily.   Marland Kitchen oxyCODONE (OXY IR/ROXICODONE) 5 MG immediate release tablet Take 1 tablet (5 mg total) by mouth every 6 (six) hours as needed for severe pain.  Marland Kitchen PROAIR HFA 108 (90 Base) MCG/ACT inhaler INHALE 1 TO 2 PUFFS BY MOUTH EVERY 4 HOURS AS NEEDED FOR WHEEZING  . SYMBICORT 160-4.5 MCG/ACT inhaler INHALE 2 PUFFS BY MOUTH EVERY 12 HOURS   No facility-administered encounter medications on file as of 11/13/2018.    Allergies  Allergen Reactions  . Fish-Derived Products Shortness Of Breath, Swelling and Rash  . Penicillins Shortness Of Breath, Swelling  and Rash    PATIENT HAS HAD A PCN REACTION WITH IMMEDIATE RASH, FACIAL/TONGUE/THROAT SWELLING, SOB, OR LIGHTHEADEDNESS WITH HYPOTENSION:  #  #  #  YES  #  #  #   Has patient  had a PCN reaction causing severe rash involving mucus membranes or skin necrosis: No PATIENT HAS HAD A PCN REACTION THAT REQUIRED HOSPITALIZATION:  #  #  #  YES  #  #  #   Has patient had a PCN reaction occurring within the last 10 years: No    . Aspirin Swelling and Rash    SWELLING REACTION UNSPECIFIED   . Brilinta [Ticagrelor] Rash    Started Brilinta 09/2014 - rash - unsure which it is directly related to  . Codeine Phosphate Nausea Only  . Diphenhydramine Hcl Rash    Past Medical History:  Diagnosis Date  . Allergic rhinitis   . Arthritis   . Asthma    xolair s 8/05 ?11/07; mastered hfa 12/20/08  . Benign positional vertigo   . CAD (coronary artery disease)    a. 09/2014 NSTEMI s/p LHC with sig 2V dz. dLAD diffusely diseased and not suitable for PCI. unsuccessful RCA PCI d/t heavy calcifications  . GERD (gastroesophageal reflux disease)   . Heart attack ([place])    09/2014  . Hyperlipidemia    <130 ldl pos fm hx, bp  . Hypertension   . Osteopenia    dexa 08/22/07 AP spine + 1.1, left femur -1.3, right femur -.8; dexa 10/06/09 +1.6, left femur =1.6, right femur -.  . PONV (postoperative nausea and vomiting)   . Ruptured disc, cervical   . 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 BREAST SURGERY  10-26-10   Rt. breast bx--for microcalcifications in rt. retroareolar region--dx was hyalinized fibroadenoma  . BUNIONECTOMY Bilateral   . CARDIAC CATHETERIZATION     2015  . CATARACT EXTRACTION Right 02/02/2018   Dr Satira Sark  . CATARACT EXTRACTION Left 03/30/2018  . [place] SURGERY  12/01  . CORONARY ARTERY BYPASS GRAFT N/A 10/04/2018   Procedure: CORONARY ARTERY BYPASS GRAFTING (CABG) times 2 using left  Internal mammary artery to LAD, left greater saphenous vein - open harvest.;  Surgeon: [name], MD;  Location: [facility];  Service: Open Heart Surgery;  Laterality: N/A;  . LEFT HEART CATH  AND CORONARY ANGIOGRAPHY N/A 09/27/2018   Procedure: LEFT HEART CATH AND CORONARY ANGIOGRAPHY;  Surgeon: Troy Sine, MD;  Location: [facility];  Service: Cardiovascular;  Laterality: N/A;  . LEFT HEART CATHETERIZATION WITH CORONARY ANGIOGRAM N/A 10/16/2014   Procedure: LEFT HEART CATHETERIZATION WITH CORONARY ANGIOGRAM;  Surgeon: Blane Ohara, MD;  Location: [facility] CATH LAB;  Service: Cardiovascular;  Laterality: N/A;  . Left L4-5 transforaminal lumbar interbody fusion with Depuy cage, rods and screws, local and allograft bone graft, Vivigen; bilateral decompression/partial hemilaminectomy lumbar five-sacral one  07/2017  . PERCUTANEOUS CORONARY ROTOBLATOR INTERVENTION (PCI-R) N/A 10/17/2014   Procedure: PERCUTANEOUS CORONARY ROTOBLATOR INTERVENTION (PCI-R);  Surgeon: Troy Sine, MD;  Location: [facility] CATH LAB;  Service: Cardiovascular;  Laterality: N/A;  . TEE WITHOUT CARDIOVERSION N/A 10/04/2018   Procedure: TRANSESOPHAGEAL ECHOCARDIOGRAM (TEE);  Surgeon: [name], MD;  Location: [facility];  Service: Open Heart Surgery;  Laterality: N/A;  . VEIN LIGATION AND STRIPPING Left   . VIDEO BRONCHOSCOPY Bilateral 02/05/2015   Procedure: VIDEO BRONCHOSCOPY WITHOUT FLUORO;  Surgeon: Legrand Como  Mckinley Jewel, MD;  Location: [facility]OS[facility];  Service: Endoscopy;  Laterality: Bilateral;  . VULVA /PERINEUM BIOPSY  12-30-10   --epidermoid cyst        Past Gynecological History:   GYNECOLOGIC HISTORY:  . Patient's last menstrual period was 12/21/1999. 77yo . Menarche: 77 years old . P 2 . Contraceptive none . HRT none  . Last Pap on record May 2019- for malignancy Family Hx:  Family History  Problem Relation Age of Onset  . Diabetes Mother        AODM  . Stroke Mother   . Hypertension Mother   . Heart disease Father   . Diabetes Sister        AODM  . Hypertension Sister   . Breast cancer Daughter 31       dec--mets to liver/spine   Social Hx:  Marland Kitchen Tobacco use: None . Alcohol use:  None . Illicit Drug use: None . Illicit IV Drug use: None    Review of Systems: Review of Systems  Constitutional: Positive for appetite change and unexpected weight change.  Respiratory: Positive for shortness of breath.   All other systems reviewed and are negative. +early satiety  Vitals:  Vitals:   11/13/18 1503  BP: 133/84  Pulse: 87  Resp: 18  Temp: 98.3 F (36.8 C)  SpO2: 96%   Vitals:   11/13/18 1503  Weight: 119 lb 8 oz (54.2 kg)  Height: 4\' 11"  (1.499 m)   Body mass index is 24.14 kg/m.  Physical Exam: General :  Well developed, 77 y.o., female in no apparent distress HEENT:  Normocephalic/atraumatic, symmetric, EOMI, eyelids normal Neck:   Supple, no masses.  Lymphatics:  No cervical/ submandibular/ supraclavicular/ infraclavicular/ inguinal adenopathy Respiratory:  Respirations unlabored, no use of accessory muscles CV:   Deferred Breast:  Deferred Musculoskeletal: No CVA tenderness, normal muscle strength. Abdomen:   Soft, non-tender and nondistended. No evidence of hernia. No masses. Extremities:  No lymphedema, no erythema, non-tender. Skin:   Normal inspection Neuro/Psych:  No focal motor deficit, no abnormal mental status. Normal gait. Normal affect. Alert and oriented to person, place, and time  Genito Urinary: Vulva: Normal external female genitalia.  Bladder/urethra: Urethral meatus normal in size and location. No lesions or   masses, well supported bladder Speculum exam: Vagina: No lesion, no discharge, no bleeding. Cervix: Normal appearing, no lesions. Bimanual exam:  Uterus: Normal size, mobile.  Adnexal region: No masses. Rectovaginal:  Good tone, no masses, no cul de sac nodularity, no parametrial involvement or nodularity.   Assessment  Adenocarcinoma unknown primary suspect GYN origin. ECOG PERFORMANCE STATUS: 0 - Asymptomatic  Plan  1. Complexity of visit ? This is a new problem and  additional workup is planned ? Including CT  imaging and tumor markers ? Data reviewed ?  I independently reviewed the images and the radiology reports and discussed my interpretation in the presence of patient and her friend today ? There is obvious adenopathy throughout on the PET scan.  I also see the area of concern in the right upper quadrant/pancreatic region.  In addition I see the bony lesion that is an area of concern ? I reviewed her referring doctor's office notes and I have summarized in the HPI ? History was obtained from the patient in the chart ? We reviewed she has yet to have any tumor markers ? This is an acute illness that may pose a threat to life if left untreated  ? Management is pending  further work-up 2. Origin of adenocarcinoma ? Immunohistochemistry staining is pointing to a GYN primary; my suspicion based on reviewing the imaging is this may be a primary peritoneal CA. ? Additional imaging may help differentiate other possible etiologies. ? We may need to offer diagnostic laparoscopy for further assessment pending the CT imaging. 3. Sites of disease ? Today we discussed the concept of metastatic disease.  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 ? We briefly discussed general prognosis with stage IV ? There is no obvious pelvic mass to explain the findings ? My suspicion is this may be a primary peritoneal CA; the question is whether assessment with a diagnostic laparoscopy will help differentiate and determine treatment recommendations ? I am somewhat concerned about the question of a pancreatic lesion and so the CT scan hopefully will give [place] a noninvasive assessment of this possibility ? Question bony lesion which will need to be worked up down the road; certainly does not seem to be symptomatic to any degree if this is the case 4. Management o Again depending on the CT scan results we may offer diagnostic laparoscopy o Primary management is going to be chemotherapy and I informed the patient of  this today.  However until we have finished with her from a surgical standpoint we will hold off on the referral. o I will see her back next week to go over the tumor markers and imaging o Also recommended genetic counseling.  She did have a daughter passed away from breast cancer at a young age. She agrees and  we will make a referral.  Face to face time with patient was 80 minutes. Over 50% of this time was spent on counseling and coordination of care.   Cassie Piggs, MD Gynecologic Oncologist 11/13/2018, 5:24 PM    Cc: Erasmo Leventhal, MD (Referring Cardiothoracic surgeon) Tanda Rockers, MD (PCP)

## 2018-11-14 ENCOUNTER — Telehealth: Payer: Self-pay | Admitting: *Deleted

## 2018-11-14 ENCOUNTER — Encounter: Payer: Medicare Other | Admitting: Thoracic Surgery (Cardiothoracic Vascular Surgery)

## 2018-11-14 LAB — CA 125: CANCER ANTIGEN (CA) 125: 111 U/mL — AB (ref 0.0–38.1)

## 2018-11-14 LAB — CANCER ANTIGEN 19-9: CAN 19-9: 3326 U/mL — AB (ref 0–35)

## 2018-11-14 NOTE — Telephone Encounter (Signed)
Called and schedule the patient for a genetics appt, patient declined the appt. Patient stated "I have no living relatives, just my daughter. I don't worth doing." Advised the patient that I would notify Dr. Gerarda Fraction and they could discuss more at her next appt.

## 2018-11-15 ENCOUNTER — Ambulatory Visit: Payer: Medicare Other | Admitting: Obstetrics

## 2018-11-17 ENCOUNTER — Other Ambulatory Visit (HOSPITAL_COMMUNITY)
Admission: RE | Admit: 2018-11-17 | Discharge: 2018-11-17 | Disposition: A | Discharge status: Home / Self Care | Payer: Medicare Other | Source: Ambulatory Visit | Attending: Obstetrics | Admitting: Obstetrics

## 2018-11-17 ENCOUNTER — Ambulatory Visit (HOSPITAL_COMMUNITY)
Admission: RE | Admit: 2018-11-17 | Discharge: 2018-11-17 | Disposition: A | Discharge status: Home / Self Care | Payer: Medicare Other | Source: Ambulatory Visit | Attending: Obstetrics | Admitting: Obstetrics

## 2018-11-17 ENCOUNTER — Encounter (HOSPITAL_COMMUNITY): Payer: Self-pay | Admitting: Radiology

## 2018-11-17 DIAGNOSIS — R590 Localized enlarged lymph nodes: Secondary | ICD-10-CM | POA: Diagnosis present

## 2018-11-17 DIAGNOSIS — R599 Enlarged lymph nodes, unspecified: Secondary | ICD-10-CM | POA: Diagnosis present

## 2018-11-17 MED ORDER — SODIUM CHLORIDE (PF) 0.9 % IJ SOLN
INTRAMUSCULAR | Status: AC
  Filled 2018-11-17: qty 50

## 2018-11-17 MED ORDER — IOHEXOL 300 MG/ML  SOLN
100.0000 mL | Freq: Once | INTRAMUSCULAR | Status: AC | PRN
  Administered 2018-11-17: 100 mL via INTRAVENOUS

## 2018-11-19 ENCOUNTER — Other Ambulatory Visit: Payer: Self-pay | Admitting: Internal Medicine

## 2018-11-19 NOTE — Progress Notes (Signed)
[facility] Note: New Patient FIRST VISIT   Consult was originally requested by Dr. 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 Other abnormal tumor markers    GYN Oncologic Summary 1. TBD o .  HPI: Ms. Cassie Campbell  is a very nice 77 y.o. P2  She is followed closely by cardiology and was recently taken by cardiothoracic surgery after being admitted with chest pain to the emergency room.  She had approximately 1 week admission prior to the decision being made for double bypass surgery 10/04/18.  During that procedure lymphadenopathy was noted and a lymph node was removed from the mediastinum.  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  Immunohistochemistry staining shows positive CK 7, PAX 8, and patchy estrogen receptor positivity.  Negative CK 20, TTF-1, and naps and a.  Differential includes a gynecologic primary.  Given these findings she was referred to [place] for management and recommendations by her cardiothoracic surgeon, Dr. Roxan Hockey.  She denies post menopausal bleeding.  She has noticed about a 5 pound weight loss unintentionally in the past 6 months.  She has decreased appetite and early satiety.  She states she has normal bowel movements and normal urination.  She denies pelvic pain.  Between her bypass surgery and her referral here she had a PET scan performed today and is unaware of the results as of yet. PET is showing   Irregular soft tissue lesion involving the wall of the gallbladder fundus and inferior liver markedly hypermetabolic. Primary gallbladder neoplasm is suspected although metastatic liver lesion with gallbladder wall involvement cannot be excluded on this study  Bulky upper abdominal lymphadenopathy with confluent soft tissue attenuation in the hepato duodenal  ligament involving the head of pancreas. This may all reflect lymphadenopathy although pancreatic head mass cannot be excluded   Hypermetabolic lymphadenopathy in the left supraclavicular region, mediastinum, right hilum, and abdomen, compatible with metastatic disease.   Large hypermetabolic bony lesion posterior left pelvis.   Tiny asymmetric focus of increased FDG uptake in the left oropharynx, indeterminate   Since her last visit I had a CT performed and called pathology to re-review her slides. Her tissue is being sent out for CA19-9 to look at GI/pancreatic origin.    Imported EPIC Oncologic History:   No history exists.    Measurement of disease:   Recent Labs    11/13/18 1608  CAN125 111.0*  CEA  was not done? But was ordered  CA19-9 = 3326  Radiology: 11/13/2018 - Nm Pet Image Initial (pi) Skull Base To Thigh CLINICAL DATA:  Initial treatment strategy for adenocarcinoma of unknown origin. EXAM: NUCLEAR MEDICINE PET SKULL BASE TO THIGH TECHNIQUE: 5.8 mCi F-18 FDG was injected intravenously. Full-ring PET imaging was performed from the skull base to thigh after the radiotracer. CT data was obtained and used for attenuation correction and anatomic localization. Fasting blood glucose: 105 mg/dl COMPARISON:  None. FINDINGS: Mediastinal blood pool activity: SUV max 1.7 NECK: Trace asymmetric increased uptake noted left oropharynx with SUV max = 3.6 Incidental CT findings: none CHEST: 12 mm short axis left supraclavicular lymph node demonstrates SUV max = 8.4. Hypermetabolic mediastinal lymphadenopathy evident with 11 mm short axis retroesophageal lymph node demonstrating SUV max = 7.9. Uptake in the inferior right hilum suggest lymphadenopathy. And a 11 mm short axis para-aortic node (92/4) demonstrates SUV max =  8.1. Uptake is identified in the region of the median sternotomy, potentially related to prior surgery. Incidental CT findings: Atherosclerotic calcification is noted in the  wall of the thoracic aorta. Coronary artery calcification is evident. Pericardial calcification evident the along the left ventricle. Atelectasis noted along the right major fissure. No pleural effusion. ABDOMEN/PELVIS: 3.4 x 4.2 cm lesion along the fundal wall of the gallbladder is markedly hypermetabolic with SUV max = 00.9. Bulky upper abdominal lymphadenopathy is hypermetabolic. 20 mm short axis left para-aortic lymph node demonstrates SUV max = 14.1. Lymphadenopathy is identified in the hepato duodenal ligament which blends imperceptibly with the head of pancreas on this noncontrast study. Hypermetabolism in this region demonstrates SUV max = 13.6. Incidental CT findings: There is abdominal aortic atherosclerosis without aneurysm. Tiny layering stones are seen in the gallbladder lumen. SKELETON: 3.9 x 4.2 cm destructive mass posterior left acetabulum and ischial tuberosity demonstrates SUV max = 20.6. Incidental CT findings: None. IMPRESSION: 1. Irregular soft tissue lesion involving the wall of the gallbladder fundus and inferior liver is markedly hypermetabolic. Primary gallbladder neoplasm is suspected although metastatic liver lesion with gallbladder wall involvement cannot be excluded on this study. 2. Bulky upper abdominal lymphadenopathy with confluent soft tissue attenuation in the hepato duodenal ligament involving the head of pancreas. This may all reflect lymphadenopathy although pancreatic head mass cannot be excluded on the noncontrast CT images obtained for attenuation correction. Contrast infused CT scan of the abdomen and pelvis is recommended to further evaluate as primary pancreatic adenocarcinoma cannot be excluded. If the patient is able to reproducibly breath hold, MRI of the abdomen without and with contrast could be considered. 3. Hypermetabolic lymphadenopathy in the left supraclavicular region, mediastinum, right hilum, and abdomen, compatible with metastatic disease. 4. Large  hypermetabolic bony lesion posterior left pelvis. 5. Tiny asymmetric focus of increased FDG uptake in the left oropharynx, indeterminate. 6. Cholelithiasis. 7.  Aortic Atherosclerois (ICD10-170.0) Electronically Signed   By: Misty Stanley M.D.   On: 11/13/2018 13:12  Ct Chest W Contrast Result Date: 11/17/2018 CLINICAL DATA:  77 year old female with history of enlarged lymph nodes in the chest and axilla. EXAM: CT CHEST, ABDOMEN, AND PELVIS WITH CONTRAST TECHNIQUE: Multidetector CT imaging of the chest, abdomen and pelvis was performed following the standard protocol during bolus administration of intravenous contrast. CONTRAST:  170mL OMNIPAQUE IOHEXOL 300 MG/ML  SOLN COMPARISON:  PET-CT 11/13/2018. FINDINGS: CT CHEST FINDINGS Cardiovascular: Heart size is borderline enlarged. There is no significant pericardial fluid, thickening or pericardial calcification. There is aortic atherosclerosis, as well as atherosclerosis of the great vessels of the mediastinum and the coronary arteries, including calcified atherosclerotic plaque in the left main, left anterior descending, left circumflex and right coronary arteries. Status post median sternotomy for CABG including LIMA to the LAD. Calcifications of the aortic valve. Mediastinum/Nodes: Mildly enlarged posterior mediastinal lymph node adjacent to the descending thoracic aorta on the left side (axial image 44 of series 2) measuring 15 mm in short axis. Additional nonenlarged right para-aortic lymph node (axial image 25 of series 2), corresponding to a focus of hypermetabolism on the recent PET-CT. Mildly enlarged left supraclavicular lymph node measuring 12 mm in short axis (axial image 6 of series 2) corresponding to an additional focus of hypermetabolism on the prior PET-CT. Esophagus is unremarkable in appearance. No axillary lymphadenopathy. Lungs/Pleura: Complete atelectasis of the right middle lobe. Mild diffuse bronchial wall thickening with patchy areas of  thickening of the peribronchovascular interstitium and some regional architectural distortion, most  pronounced in the right lower lobe, most compatible with areas of mild post infectious or inflammatory scarring. No acute consolidative airspace disease. No pleural effusions. No suspicious appearing pulmonary nodules or masses are noted. Musculoskeletal: Median sternotomy wires. Chronic appearing compression fracture of superior endplate of T7 with 08-14% loss of anterior vertebral body height. There are no aggressive appearing lytic or blastic lesions noted in the visualized portions of the skeleton. CT ABDOMEN PELVIS FINDINGS Hepatobiliary: No suspicious cystic or solid hepatic lesions. Gallbladder is grossly abnormal, with a large avidly enhancing soft tissue mass which measures approximately 5.2 x 3.5 x 3.6 cm (axial image 68 of series 2 and coronal image 35 of series 5), highly concerning for primary neoplasm of the gallbladder. Pancreas: No definite pancreatic mass. No pancreatic ductal dilatation. No pancreatic or peripancreatic fluid or inflammatory changes. Spleen: Unremarkable. Adrenals/Urinary Tract: Subcentimeter low-attenuation lesions in the left kidney are too small to definitively characterize, but are statistically likely to represent tiny cysts. Right kidney and bilateral adrenal glands are normal in appearance. No hydroureteronephrosis. Urinary bladder is normal in appearance. Stomach/Bowel: Normal appearance of the stomach. No pathologic dilatation of small bowel or colon. Normal appendix. Vascular/Lymphatic: Aortic atherosclerosis, without evidence of aneurysm or dissection in the abdominal or pelvic vasculature. Filling defect in the left gonadal vein (axial image 84 of series 2), compatible with nonocclusive gonadal vein thrombosis. Heterogeneously enhancing upper abdominal lymph nodes, largest of which include a hepatoduodenal ligament lymph node measuring 4.6 x 3.4 x 2.8 cm (axial image 60 of  series 2 and coronal image 50 of series 5), and a gastrohepatic ligament lymph node 4.6 x 3.2 x 2.1 cm (axial image 56 of series 2 and coronal image 48 of series 5). Bulky retroperitoneal lymphadenopathy is also noted, with the largest of these retroperitoneal lymph nodes measuring up to 22 mm in short axis in the high left para-aortic nodal station (axial image 67 of series 2). No definite pelvic lymphadenopathy. Reproductive: Uterus and ovaries are unremarkable in appearance. Other: No significant volume of ascites.  No pneumoperitoneum. Musculoskeletal: Large lytic lesion with adjacent enhancing soft tissue component in the left ischium (axial image 107 of series 2 and coronal image 80 of series 5). Status post PLIF at L4-L5 with interbody cages at L4-L5 interspace. IMPRESSION: 1. 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 This is associated with extensive upper abdominal and retroperitoneal lymphadenopathy, as well as metastatic lymphadenopathy in the posterior mediastinum and left supraclavicular region. There is also a metastatic lesion to the left ischium. 2. 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 3. Aortic atherosclerosis, in addition to left main and 3 vessel coronary artery disease. Status post median sternotomy for CABG including LIMA to the LAD. 4. There are calcifications of the aortic valve. Echocardiographic correlation for evaluation of potential valvular dysfunction may be warranted if clinically indicated. Electronically Signed   By: Vinnie Langton M.D.   On: 11/17/2018 16:20  Ct Abdomen Pelvis W Contrast Result Date: 11/17/2018 CLINICAL DATA:  77 year old female with history of enlarged lymph nodes in the chest and axilla. EXAM: CT CHEST, ABDOMEN, AND PELVIS WITH CONTRAST TECHNIQUE: Multidetector CT imaging of the chest, abdomen and pelvis was performed following the standard protocol during bolus administration of intravenous  contrast. CONTRAST:  132mL OMNIPAQUE IOHEXOL 300 MG/ML  SOLN COMPARISON:  PET-CT 11/13/2018. FINDINGS: CT CHEST FINDINGS Cardiovascular: Heart size is borderline enlarged. There is no significant pericardial fluid, thickening or pericardial calcification. There is aortic atherosclerosis,  as well as atherosclerosis of the great vessels of the mediastinum and the coronary arteries, including calcified atherosclerotic plaque in the left main, left anterior descending, left circumflex and right coronary arteries. Status post median sternotomy for CABG including LIMA to the LAD. Calcifications of the aortic valve. Mediastinum/Nodes: Mildly enlarged posterior mediastinal lymph node adjacent to the descending thoracic aorta on the left side (axial image 44 of series 2) measuring 15 mm in short axis. Additional nonenlarged right para-aortic lymph node (axial image 25 of series 2), corresponding to a focus of hypermetabolism on the recent PET-CT. Mildly enlarged left supraclavicular lymph node measuring 12 mm in short axis (axial image 6 of series 2) corresponding to an additional focus of hypermetabolism on the prior PET-CT. Esophagus is unremarkable in appearance. No axillary lymphadenopathy. Lungs/Pleura: Complete atelectasis of the right middle lobe. Mild diffuse bronchial wall thickening with patchy areas of thickening of the peribronchovascular interstitium and some regional architectural distortion, most pronounced in the right lower lobe, most compatible with areas of mild post infectious or inflammatory scarring. No acute consolidative airspace disease. No pleural effusions. No suspicious appearing pulmonary nodules or masses are noted. Musculoskeletal: Median sternotomy wires. Chronic appearing compression fracture of superior endplate of T7 with 98-92% loss of anterior vertebral body height. There are no aggressive appearing lytic or blastic lesions noted in the visualized portions of the skeleton. CT ABDOMEN  PELVIS FINDINGS Hepatobiliary: No suspicious cystic or solid hepatic lesions. Gallbladder is grossly abnormal, with a large avidly enhancing soft tissue mass which measures approximately 5.2 x 3.5 x 3.6 cm (axial image 68 of series 2 and coronal image 35 of series 5), highly concerning for primary neoplasm of the gallbladder. Pancreas: No definite pancreatic mass. No pancreatic ductal dilatation. No pancreatic or peripancreatic fluid or inflammatory changes. Spleen: Unremarkable. Adrenals/Urinary Tract: Subcentimeter low-attenuation lesions in the left kidney are too small to definitively characterize, but are statistically likely to represent tiny cysts. Right kidney and bilateral adrenal glands are normal in appearance. No hydroureteronephrosis. Urinary bladder is normal in appearance. Stomach/Bowel: Normal appearance of the stomach. No pathologic dilatation of small bowel or colon. Normal appendix. Vascular/Lymphatic: Aortic atherosclerosis, without evidence of aneurysm or dissection in the abdominal or pelvic vasculature. Filling defect in the left gonadal vein (axial image 84 of series 2), compatible with nonocclusive gonadal vein thrombosis. Heterogeneously enhancing upper abdominal lymph nodes, largest of which include a hepatoduodenal ligament lymph node measuring 4.6 x 3.4 x 2.8 cm (axial image 60 of series 2 and coronal image 50 of series 5), and a gastrohepatic ligament lymph node 4.6 x 3.2 x 2.1 cm (axial image 56 of series 2 and coronal image 48 of series 5). Bulky retroperitoneal lymphadenopathy is also noted, with the largest of these retroperitoneal lymph nodes measuring up to 22 mm in short axis in the high left para-aortic nodal station (axial image 67 of series 2). No definite pelvic lymphadenopathy. Reproductive: Uterus and ovaries are unremarkable in appearance. Other: No significant volume of ascites.  No pneumoperitoneum. Musculoskeletal: Large lytic lesion with adjacent enhancing soft tissue  component in the left ischium (axial image 107 of series 2 and coronal image 80 of series 5). Status post PLIF at L4-L5 with interbody cages at L4-L5 interspace. IMPRESSION: 1. 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 This is associated with extensive upper abdominal and retroperitoneal lymphadenopathy, as well as metastatic lymphadenopathy in the posterior mediastinum and left supraclavicular region. There is also a metastatic lesion to  the left ischium. 2. 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 3. Aortic atherosclerosis, in addition to left main and 3 vessel coronary artery disease. Status post median sternotomy for CABG including LIMA to the LAD. 4. There are calcifications of the aortic valve. Echocardiographic correlation for evaluation of potential valvular dysfunction may be warranted if clinically indicated. Electronically Signed   By: Vinnie Langton M.D.   On: 11/17/2018 16:20    Outpatient Encounter Medications as of 11/20/2018  Medication Sig  . albuterol (PROAIR HFA) 108 (90 Base) MCG/ACT inhaler INHALE 1 TO 2 PUFFS BY MOUTH EVERY 4 HOURS AS NEEDED FOR WHEEZING  . atorvastatin (LIPITOR) 80 MG tablet TAKE 1 TABLET(80 MG) BY MOUTH DAILY  . bisoprolol (ZEBETA) 10 MG tablet Take 1 tablet (10 mg total) by mouth daily.  . celecoxib (CELEBREX) 200 MG capsule Take 1 capsule (200 mg total) by mouth 2 (two) times daily.  . cholecalciferol 2000 units TABS Take 1 tablet (2,000 Units total) by mouth daily.  . clopidogrel (PLAVIX) 75 MG tablet Take 1 tablet (75 mg total) by mouth daily.  . clotrimazole-betamethasone (LOTRISONE) cream Apply 1 application topically as needed (itching). Use as directed for rash  . dextromethorphan-guaiFENesin (MUCINEX DM) 30-600 MG per 12 hr tablet Take 1-2 tablets by mouth every 12 (twelve) hours as needed for cough (with flutter).   . meclizine (ANTIVERT) 25 MG tablet TAKE 2 TABLETS BY MOUTH THREE TIMES  DAILY AS NEEDED (Patient taking differently: TAKE 2 TABLETS BY MOUTH THREE TIMES DAILY AS NEEDED FOR DIZZNESS)  . montelukast (SINGULAIR) 10 MG tablet TAKE 1 TABLET(10 MG) BY MOUTH AT BEDTIME  . Multiple Vitamin (MULTIVITAMIN) capsule Take 1 capsule by mouth daily.   Marland Kitchen oxyCODONE (OXY IR/ROXICODONE) 5 MG immediate release tablet Take 1 tablet (5 mg total) by mouth every 6 (six) hours as needed for severe pain.  . SYMBICORT 160-4.5 MCG/ACT inhaler INHALE 2 PUFFS BY MOUTH EVERY 12 HOURS  . [DISCONTINUED] montelukast (SINGULAIR) 10 MG tablet TAKE 1 TABLET(10 MG) BY MOUTH AT BEDTIME  . [DISCONTINUED] PROAIR HFA 108 (90 Base) MCG/ACT inhaler INHALE 1 TO 2 PUFFS BY MOUTH EVERY 4 HOURS AS NEEDED FOR WHEEZING   No facility-administered encounter medications on file as of 11/20/2018.    Allergies  Allergen Reactions  . Fish-Derived Products Shortness Of Breath, Swelling and Rash  . Penicillins Shortness Of Breath, Swelling and Rash    PATIENT HAS HAD A PCN REACTION WITH IMMEDIATE RASH, FACIAL/TONGUE/THROAT SWELLING, SOB, OR LIGHTHEADEDNESS WITH HYPOTENSION:  #  #  #  YES  #  #  #   Has patient had a PCN reaction causing severe rash involving mucus membranes or skin necrosis: No PATIENT HAS HAD A PCN REACTION THAT REQUIRED HOSPITALIZATION:  #  #  #  YES  #  #  #   Has patient had a PCN reaction occurring within the last 10 years: No    . Aspirin Swelling and Rash    SWELLING REACTION UNSPECIFIED   . Brilinta [Ticagrelor] Rash    Started Brilinta 09/2014 - rash - unsure which it is directly related to  . Codeine Phosphate Nausea Only  . Diphenhydramine Hcl Rash    Past Medical History:  Diagnosis Date  . Allergic rhinitis   . Arthritis   . Asthma    xolair s 8/05 ?11/07; mastered hfa 12/20/08  . Benign positional vertigo   . CAD (coronary artery disease)    a. 09/2014 NSTEMI s/p LHC with sig  2V dz. dLAD diffusely diseased and not suitable for PCI. unsuccessful RCA PCI d/t heavy calcifications  .  GERD (gastroesophageal reflux disease)   . Heart attack ([place])    09/2014  . Hyperlipidemia    <130 ldl pos fm hx, bp  . Hypertension   . Osteopenia    dexa 08/22/07 AP spine + 1.1, left femur -1.3, right femur -.8; dexa 10/06/09 +1.6, left femur =1.6, right femur -.  . PONV (postoperative nausea and vomiting)   . Ruptured disc, cervical   . 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 BREAST SURGERY  10-26-10   Rt. breast bx--for microcalcifications in rt. retroareolar region--dx was hyalinized fibroadenoma  . BUNIONECTOMY Bilateral   . CARDIAC CATHETERIZATION     2015  . CATARACT EXTRACTION Right 02/02/2018   Dr Satira Sark  . CATARACT EXTRACTION Left 03/30/2018  . [place] SURGERY  12/01  . CORONARY ARTERY BYPASS GRAFT N/A 10/04/2018   Procedure: CORONARY ARTERY BYPASS GRAFTING (CABG) times 2 using left  Internal mammary artery to LAD, left greater saphenous vein - open harvest.;  Surgeon: [name], MD;  Location: [facility];  Service: Open Heart Surgery;  Laterality: N/A;  . LEFT HEART CATH AND CORONARY ANGIOGRAPHY N/A 09/27/2018   Procedure: LEFT HEART CATH AND CORONARY ANGIOGRAPHY;  Surgeon: Troy Sine, MD;  Location: [facility];  Service: Cardiovascular;  Laterality: N/A;  . LEFT HEART CATHETERIZATION WITH CORONARY ANGIOGRAM N/A 10/16/2014   Procedure: LEFT HEART CATHETERIZATION WITH CORONARY ANGIOGRAM;  Surgeon: Blane Ohara, MD;  Location[facility] C[facility] LAB;  Service: Cardiovascular;  Laterality: N/A;  . Left L4-5 transforaminal lumbar interbody fusion with Depuy cage, rods and screws, local and allograft bone graft, Vivigen; bilateral decompression/partial hemilaminectomy lumbar five-sacral one  07/2017  . PERCUTANEOUS CORONARY ROTOBLATOR INTERVENTION (PCI-R) N/A 10/17/2014   Procedure: PERCUTANEOUS CORONARY ROTOBLATOR INTERVENTION (PCI-R);  Surgeon: Troy Sine, MD;  Location: [facility] CATH  LAB;  Service: Cardiovascular;  Laterality: N/A;  . TEE WITHOUT CARDIOVERSION N/A 10/04/2018   Procedure: TRANSESOPHAGEAL ECHOCARDIOGRAM (TEE);  Surgeon: [name], MD;  Location: [facility];  Service: Open Heart Surgery;  Laterality: N/A;  . VEIN LIGATION AND STRIPPING Left   . VIDEO BRONCHOSCOPY Bilateral 02/05/2015   Procedure: VIDEO BRONCHOSCOPY WITHOUT FLUORO;  Surgeon: Tanda Rockers, MD;  Location: [facility];  Service: Endoscopy;  Laterality: Bilateral;  . VULVA /PERINEUM BIOPSY  12-30-10   --epidermoid cyst        Past Gynecological History:   GYNECOLOGIC HISTORY:  . Patient's last menstrual period was 12/21/1999. 77yo . Menarche: 77 years old . P 2 . Contraceptive none . HRT none  . Last Pap on record May 2019- for malignancy Family Hx:  Family History  Problem Relation Age of Onset  . Diabetes Mother        AODM  . Stroke Mother   . Hypertension Mother   . Heart disease Father   . Diabetes Sister        AODM  . Hypertension Sister   . Breast cancer Daughter 44       dec--mets to liver/spine   Social Hx:  Marland Kitchen Tobacco use: None . Alcohol use: None . Illicit Drug use: None . Illicit IV Drug use: None    Review of Systems: Review of Systems  All other systems reviewed and are negative. +early satiety  Vitals:  Vitals:  11/20/18 1211  BP: 136/74  Pulse: 88  Resp: 14  Temp: 98 F (36.7 C)  SpO2: 98%   Vitals:   11/20/18 1211  Weight: 121 lb (54.9 kg)  Height: 4\' 11"  (1.499 m)   Body mass index is 24.44 kg/m.  Physical Exam: General :  Well developed, 77 y.o., female in no apparent distress HEENT:  Normocephalic/atraumatic, symmetric, EOMI, eyelids normal Neck:   No visible masses.  Respiratory:  Respirations unlabored, no use of accessory muscles CV:   Deferred Breast:  Deferred Musculoskeletal: Normal muscle strength. Abdomen:  No visible masses or protrusion Extremities:  No visible edema or deformities Skin:   Normal  inspection Neuro/Psych:  No focal motor deficit, no abnormal mental status. Normal gait. Normal affect. Alert and oriented to person, place, and time   Genito Urinary: on 11/13/18 Vulva: Normal external female genitalia.  Bladder/urethra: Urethral meatus normal in size and location. No lesions or   masses, well supported bladder Speculum exam: Vagina: No lesion, no discharge, no bleeding. Cervix: Normal appearing, no lesions. Bimanual exam:  Uterus: Normal size, mobile.  Adnexal region: No masses. Rectovaginal:  Good tone, no masses, no cul de sac nodularity, no parametrial involvement or nodularity.   Assessment  Adenocarcinoma unknown primary  ECOG PERFORMANCE STATUS: 0 - Asymptomatic  Plan  1. Origin of adenocarcinoma ? Immunohistochemistry staining is pointing to a GYN primary; my suspicion would be a primary peritoneal CA if it is GYN in origin ? HOWEVER on review of the CT I am less inclined to think this is peritoneal.  ? The main bulk of disease is sitting in the RUQ by the liver and not near the expected location of an omental cake. ? Pathology has sent a sample off to test for CA19-9  ? 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 Sites of disease ? 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 ? We briefly discussed general prognosis with stage IV ? Question bony lesion which will need to be worked up down the road; certainly does not seem to be symptomatic to any degree if this is the case 3. Management o Primary management is likely going to be chemotherapy and I informed the patient of this on her last visit o I will refer her onto Medical Oncology o Last visit I recommended genetic counseling.  She did have a daughter passed away from breast cancer at a young age. She initially agreed to testing but told the office she wanted to cancel. i. I defer continued counseling on genetics to Medical  Oncology. 4. Referral will be made to Dr. Burr Medico who will plan to present her case at GI conference.  Face to face time with patient was 15 minutes. Over 50% of this time was spent on counseling and coordination of care.   Mart Piggs, MD Gynecologic Oncologist 11/20/2018, 12:44 PM    Cc: Erasmo Leventhal, MD (Referring Cardiothoracic surgeon) Tanda Rockers, MD (PCP) Truitt Merle, MD (Medical Oncology)

## 2018-11-20 ENCOUNTER — Telehealth: Payer: Self-pay | Admitting: Hematology

## 2018-11-20 ENCOUNTER — Telehealth: Payer: Self-pay | Admitting: Oncology

## 2018-11-20 ENCOUNTER — Encounter: Payer: Self-pay | Admitting: Obstetrics

## 2018-11-20 ENCOUNTER — Inpatient Hospital Stay: Payer: Medicare Other | Attending: Obstetrics | Admitting: Obstetrics

## 2018-11-20 ENCOUNTER — Telehealth: Payer: Self-pay | Admitting: Internal Medicine

## 2018-11-20 VITALS — BP 136/74 | HR 88 | Temp 98.0°F | Resp 14 | Ht 59.0 in | Wt 121.0 lb

## 2018-11-20 DIAGNOSIS — R634 Abnormal weight loss: Secondary | ICD-10-CM | POA: Diagnosis not present

## 2018-11-20 DIAGNOSIS — R6881 Early satiety: Secondary | ICD-10-CM | POA: Diagnosis not present

## 2018-11-20 DIAGNOSIS — C771 Secondary and unspecified malignant neoplasm of intrathoracic lymph nodes: Secondary | ICD-10-CM | POA: Diagnosis not present

## 2018-11-20 DIAGNOSIS — R978 Other abnormal tumor markers: Secondary | ICD-10-CM | POA: Diagnosis not present

## 2018-11-20 DIAGNOSIS — C801 Malignant (primary) neoplasm, unspecified: Secondary | ICD-10-CM | POA: Insufficient documentation

## 2018-11-20 DIAGNOSIS — K828 Other specified diseases of gallbladder: Secondary | ICD-10-CM

## 2018-11-20 MED ORDER — ALBUTEROL SULFATE HFA 108 (90 BASE) MCG/ACT IN AERS: INHALATION_SPRAY | RESPIRATORY_TRACT | 3 refills | Status: DC

## 2018-11-20 NOTE — Patient Instructions (Signed)
1. Referral will be made to Dr. Burr Medico 2. Followup here if needed or recommended by other providers.

## 2018-11-20 NOTE — Telephone Encounter (Signed)
Called Cassie Campbell and advised her of appointment with Dr. Burr Medico tomorrow, 11/21/18 at 3:15 pm.  She verbalized understanding and agreement.

## 2018-11-20 NOTE — Telephone Encounter (Signed)
Pt has been scheduled to see Dr. Burr Medico on 12/3 at 315pm. Pt has been notified of the appt date and time.

## 2018-11-20 NOTE — Telephone Encounter (Signed)
Called spoke with patient about pharmacy for refill to be sent to. Asked if she had a follow up, patient stated she had to cancel it, so would like to reschedule her follow up with Dr. Melvyn Novas.  Refill sent to [facility] and appt made

## 2018-11-21 ENCOUNTER — Inpatient Hospital Stay (HOSPITAL_BASED_OUTPATIENT_CLINIC_OR_DEPARTMENT_OTHER): Payer: Medicare Other | Admitting: Hematology

## 2018-11-21 ENCOUNTER — Encounter: Payer: Self-pay | Admitting: Hematology

## 2018-11-21 ENCOUNTER — Other Ambulatory Visit: Payer: Self-pay | Admitting: Internal Medicine

## 2018-11-21 ENCOUNTER — Telehealth: Payer: Self-pay

## 2018-11-21 VITALS — BP 147/83 | HR 97 | Temp 98.5°F | Resp 16 | Ht 59.0 in | Wt 120.8 lb

## 2018-11-21 DIAGNOSIS — I2510 Atherosclerotic heart disease of native coronary artery without angina pectoris: Secondary | ICD-10-CM | POA: Diagnosis not present

## 2018-11-21 DIAGNOSIS — R978 Other abnormal tumor markers: Secondary | ICD-10-CM | POA: Diagnosis not present

## 2018-11-21 DIAGNOSIS — Z7189 Other specified counseling: Secondary | ICD-10-CM | POA: Insufficient documentation

## 2018-11-21 DIAGNOSIS — C7951 Secondary malignant neoplasm of bone: Secondary | ICD-10-CM | POA: Diagnosis not present

## 2018-11-21 DIAGNOSIS — I10 Essential (primary) hypertension: Secondary | ICD-10-CM | POA: Diagnosis not present

## 2018-11-21 DIAGNOSIS — C23 Malignant neoplasm of gallbladder: Secondary | ICD-10-CM | POA: Diagnosis not present

## 2018-11-21 MED ORDER — LIDOCAINE-PRILOCAINE 2.5-2.5 % EX CREA: TOPICAL_CREAM | CUTANEOUS | 3 refills | Status: DC

## 2018-11-21 MED ORDER — PROCHLORPERAZINE MALEATE 10 MG PO TABS: 10.0000 mg | ORAL_TABLET | Freq: Four times a day (QID) | ORAL | 1 refills | Status: DC | PRN

## 2018-11-21 MED ORDER — ONDANSETRON HCL 8 MG PO TABS: 8.0000 mg | ORAL_TABLET | Freq: Two times a day (BID) | ORAL | 1 refills | Status: DC | PRN

## 2018-11-21 NOTE — Progress Notes (Signed)
[place]  [phone] [phone]  Clinic New Consult Note   Patient Care Team: Tanda Rockers, MD as PCP - General (Pulmonary Disease) Troy Sine, MD as PCP - Cardiology (Cardiology) 11/21/2018  REFERRAL PHYSICIAN: Dr. 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 female is here because of her recently diagnosed metastatic adenocarcinoma.  She was referred by GYN oncologist Dr. Gerarda Fraction.  She has no history of coronary artery disease, non-STEMI in 2015, presented to her cardiologist office on September 27, 2018 with persistent chest pain.  She was admitted to hospital directly, and underwent cardiac catheterization, which showed severe three-vessel disease, EF was 50 to 55%.  She underwent bypass surgery X2 By Dr. Roxan Hockey.  Heart surgery, mediastinal adenopathy was noticed, and lymph node biopsy was obtained.  Unfortunately the pathology showed metastatic adenocarcinoma. The IHC studies positive for CK7, PAX8, and patchy positivity for estrogen receptor.  The abdomen favors GYN primary.  She was referred to see GI and oncologist Dr. Gerarda Fraction.  She underwent PET scan and CT chest, abdomen and pelvis with contrast, which showed diffuse adenopathy from supraclavicular to abdomen, hypermetabolic mass in the gallbladder, and large hypermetabolic bony lesion posterior left pelvis.  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  She has recovered well from her open heart surgery, she lives alone, able to do everything for herself at home.  She has mild dyspnea on exertion, such as walking for about 100 feet, no orthopnea, leg edema.  She has no chest pain since her surgery.  Has any abdominal discomfort before or after the surgery, no nausea, change of bowel habits, or weight loss. She feels well  overall.  The review of systems otherwise negative.  She is widowed, has 2 daughters, and 1 died from metastatic breast cancer many years ago.  The other daughter is not very close to her, and is " not very reliable" per patient. She came in with a friend today.   MEDICAL HISTORY:  Past Medical History:  Diagnosis Date  . Allergic rhinitis   . Arthritis   . Asthma    xolair s 8/05 ?11/07; mastered hfa 12/20/08  . Benign positional vertigo   . CAD (coronary artery disease)    a. 09/2014 NSTEMI s/p LHC with sig 2V dz. dLAD diffusely diseased and not suitable for PCI. unsuccessful RCA PCI d/t heavy calcifications  . GERD (gastroesophageal reflux disease)   . Heart attack ([place])    09/2014  . Hyperlipidemia    <130 ldl pos fm hx, bp  . Hypertension   . Osteopenia    dexa 08/22/07 AP spine + 1.1, left femur -1.3, right femur -.8; dexa 10/06/09 +1.6, left femur =1.6, right femur -.  . PONV (postoperative nausea and vomiting)   . Ruptured disc, cervical   . 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 BREAST SURGERY  10-26-10   Rt. breast bx--for microcalcifications in rt. retroareolar region--dx was hyalinized fibroadenoma  . BUNIONECTOMY Bilateral   . CARDIAC CATHETERIZATION     2015  . CATARACT EXTRACTION Right 02/02/2018   Dr Satira Sark  . CATARACT EXTRACTION Left 03/30/2018  . [place] SURGERY  12/01  . CORONARY ARTERY BYPASS GRAFT N/A 10/04/2018   Procedure: CORONARY ARTERY  BYPASS GRAFTING (CABG) times 2 using left  Internal mammary artery to LAD, left greater saphenous vein - open harvest.;  Surgeon: [name], MD;  Location: [facility];  Service: Open Heart Surgery;  Laterality: N/A;  . LEFT HEART CATH AND CORONARY ANGIOGRAPHY N/A 09/27/2018   Procedure: LEFT HEART CATH AND CORONARY ANGIOGRAPHY;  Surgeon: Troy Sine, MD;  Location: [place] [facility];  Service:  Cardiovascular;  Laterality: N/A;  . LEFT HEART CATHETERIZATION WITH CORONARY ANGIOGRAM N/A 10/16/2014   Procedure: LEFT HEART CATHETERIZATION WITH CORONARY ANGIOGRAM;  Surgeon: Blane Ohara, MD;  Location[facility] C[facility] LAB;  Service: Cardiovascular;  Laterality: N/A;  . Left L4-5 transforaminal lumbar interbody fusion with Depuy cage, rods and screws, local and allograft bone graft, Vivigen; bilateral decompression/partial hemilaminectomy lumbar five-sacral one  07/2017  . PERCUTANEOUS CORONARY ROTOBLATOR INTERVENTION (PCI-R) N/A 10/17/2014   Procedure: PERCUTANEOUS CORONARY ROTOBLATOR INTERVENTION (PCI-R);  Surgeon: Troy Sine, MD;  Location: [facility] CATH LAB;  Service: Cardiovascular;  Laterality: N/A;  . TEE WITHOUT CARDIOVERSION N/A 10/04/2018   Procedure: TRANSESOPHAGEAL ECHOCARDIOGRAM (TEE);  Surgeon: [name], MD;  Location: [place];  Service: Open Heart Surgery;  Laterality: N/A;  . VEIN LIGATION AND STRIPPING Left   . VIDEO BRONCHOSCOPY Bilateral 02/05/2015   Procedure: VIDEO BRONCHOSCOPY WITHOUT FLUORO;  Surgeon: Tanda Rockers, MD;  Location: [facility];  Service: Endoscopy;  Laterality: Bilateral;  . VULVA /PERINEUM BIOPSY  12-30-10   --epidermoid cyst    SOCIAL HISTORY: Social History   Socioeconomic History  . Marital status: Widowed    Spouse name: Not on file  . Number of children: Not on file  . Years of education: Not on file  . Highest education level: Not on file  Occupational History  . Occupation: retired Tour manager  Social Needs  . Financial resource strain: Not on file  . Food insecurity:    Worry: Not on file    Inability: Not on file  . Transportation needs:    Medical: Not on file    Non-medical: Not on file  Tobacco Use  . Smoking status: Never Smoker  . Smokeless tobacco: Never Used  Substance and Sexual Activity  . Alcohol use: No    Alcohol/week: 0.0 standard drinks  . Drug use: No  . Sexual activity: Not Currently     Partners: Male    Birth control/protection: Post-menopausal  Lifestyle  . Physical activity:    Days per week: Not on file    Minutes per session: Not on file  . Stress: Not on file  Relationships  . Social connections:    Talks on phone: Not on file    Gets together: Not on file    Attends religious service: Not on file    Active member of club or organization: Not on file    Attends meetings of clubs or organizations: Not on file    Relationship status: Not on file  . Intimate partner violence:    Fear of current or ex partner: Not on file    Emotionally abused: Not on file    Physically abused: Not on file    Forced sexual activity: Not on file  Other Topics Concern  . Not on file  Social History Narrative  . Not on file    FAMILY HISTORY: Family History  Problem Relation Age of Onset  . Diabetes Mother        AODM  . Stroke Mother   . Hypertension Mother   .  Heart disease Father   . Diabetes Sister        AODM  . Hypertension Sister   . Breast cancer Daughter 43       dec--mets to liver/spine    ALLERGIES:  is allergic to fish-derived products; penicillins; aspirin; brilinta [ticagrelor]; codeine phosphate; and diphenhydramine hcl.  MEDICATIONS:  Current Outpatient Medications  Medication Sig Dispense Refill  . albuterol (PROAIR HFA) 108 (90 Base) MCG/ACT inhaler INHALE 1 TO 2 PUFFS BY MOUTH EVERY 4 HOURS AS NEEDED FOR WHEEZING 8.5 g 3  . atorvastatin (LIPITOR) 80 MG tablet TAKE 1 TABLET(80 MG) BY MOUTH DAILY 90 tablet 1  . bisoprolol (ZEBETA) 10 MG tablet Take 1 tablet (10 mg total) by mouth daily. 90 tablet 1  . celecoxib (CELEBREX) 200 MG capsule Take 1 capsule (200 mg total) by mouth 2 (two) times daily. 60 capsule 1  . cholecalciferol 2000 units TABS Take 1 tablet (2,000 Units total) by mouth daily. 30 tablet 3  . clopidogrel (PLAVIX) 75 MG tablet Take 1 tablet (75 mg total) by mouth daily. 30 tablet 1  . clotrimazole-betamethasone (LOTRISONE) cream Apply  1 application topically as needed (itching). Use as directed for rash    . dextromethorphan-guaiFENesin (MUCINEX DM) 30-600 MG per 12 hr tablet Take 1-2 tablets by mouth every 12 (twelve) hours as needed for cough (with flutter).     . meclizine (ANTIVERT) 25 MG tablet TAKE 2 TABLETS BY MOUTH THREE TIMES DAILY AS NEEDED (Patient taking differently: TAKE 2 TABLETS BY MOUTH THREE TIMES DAILY AS NEEDED FOR DIZZNESS) 24 tablet 0  . montelukast (SINGULAIR) 10 MG tablet TAKE 1 TABLET(10 MG) BY MOUTH AT BEDTIME 90 tablet 0  . montelukast (SINGULAIR) 10 MG tablet TAKE 1 TABLET(10 MG) BY MOUTH AT BEDTIME 90 tablet 0  . Multiple Vitamin (MULTIVITAMIN) capsule Take 1 capsule by mouth daily.     Marland Kitchen oxyCODONE (OXY IR/ROXICODONE) 5 MG immediate release tablet Take 1 tablet (5 mg total) by mouth every 6 (six) hours as needed for severe pain. 25 tablet 0  . SYMBICORT 160-4.5 MCG/ACT inhaler INHALE 2 PUFFS BY MOUTH EVERY 12 HOURS 10.2 g 11   No current facility-administered medications for this visit.     REVIEW OF SYSTEMS:   Constitutional: Denies fevers, chills or abnormal night sweats Eyes: Denies blurriness of vision, double vision or watery eyes Ears, nose, mouth, throat, and face: Denies mucositis or sore throat Respiratory: Denies cough, or wheezing. (+) dyspnea on moderate exertion  Cardiovascular: Denies palpitation, chest discomfort or lower extremity swelling Gastrointestinal:  Denies nausea, heartburn or change in bowel habits Skin: Denies abnormal skin rashes Lymphatics: Denies new lymphadenopathy or easy bruising Neurological:Denies numbness, tingling or new weaknesses. She has mild discomfort at left thigh due to her vein surgery for CABG  Behavioral/Psych: Mood is stable, no new changes  All other systems were reviewed with the patient and are negative.  PHYSICAL EXAMINATION: ECOG PERFORMANCE STATUS: 1 - Symptomatic but completely ambulatory  Vitals:   11/21/18 1524  BP: (!) 147/83    Pulse: 97  Resp: 16  Temp: 98.5 F (36.9 C)  SpO2: 100%   Filed Weights   11/21/18 1524  Weight: 120 lb 12.8 oz (54.8 kg)    GENERAL:alert, no distress and comfortable SKIN: skin color, texture, turgor are normal, no rashes or significant lesions EYES: normal, conjunctiva are pink and non-injected, sclera clear OROPHARYNX:no exudate, no erythema and lips, buccal mucosa, and tongue normal  NECK: supple, thyroid normal size,  non-tender, without nodularity LYMPH:  no palpable lymphadenopathy in the cervical, axillary or inguinal LUNGS: clear to auscultation and percussion with normal breathing effort HEART: regular rate & rhythm and no murmurs and no lower extremity edema ABDOMEN:abdomen soft, mild tenderness at RUQ, no organomegaly or ascites, and normal bowel sounds Musculoskeletal:no cyanosis of digits and no clubbing  PSYCH: alert & oriented x 3 with fluent speech NEURO: no focal motor/sensory deficits  LABORATORY DATA:  I have reviewed the data as listed CBC Latest Ref Rng & Units 10/07/2018 10/06/2018 10/05/2018  WBC 4.0 - 10.5 K/uL 13.5(H) 12.6(H) -  Hemoglobin 12.0 - 15.0 g/dL 10.0(L) 9.0(L) 9.9(L)  Hematocrit 36.0 - 46.0 % 32.7(L) 29.5(L) 29.0(L)  Platelets 150 - 400 K/uL 290 239 -   CMP Latest Ref Rng & Units 11/13/2018 10/07/2018 10/06/2018  Glucose 70 - 99 mg/dL - 123(H) 113(H)  BUN 8 - 23 mg/dL 9 5(L) <5(L)  Creatinine 0.44 - 1.00 mg/dL 0.70 0.64 0.53  Sodium 135 - 145 mmol/L - 139 133(L)  Potassium 3.5 - 5.1 mmol/L - 3.9 3.9  Chloride 98 - 111 mmol/L - 101 101  CO2 22 - 32 mmol/L - 29 24  Calcium 8.9 - 10.3 mg/dL - 9.2 9.0  Total Protein 6.5 - 8.1 g/dL - - -  Total Bilirubin 0.3 - 1.2 mg/dL - - -  Alkaline Phos 38 - 126 U/L - - -  AST 15 - 41 U/L - - -  ALT 0 - 44 U/L - - -     RADIOGRAPHIC STUDIES: I have personally reviewed the radiological images as listed and agreed with the findings in the report. Dg Chest 2 View  Result Date:  10/31/2018 CLINICAL DATA:  Status post coronary artery bypass graft. EXAM: CHEST - 2 VIEW COMPARISON:  Radiographs of October 07, 2018. FINDINGS: Stable cardiomediastinal silhouette. No pneumothorax or pleural effusion is noted. Both lungs are clear. The visualized skeletal structures are unremarkable. IMPRESSION: No active cardiopulmonary disease. Electronically Signed   By: Marijo Conception, M.D.   On: 10/31/2018 13:36   Ct Chest W Contrast  Result Date: 11/17/2018 CLINICAL DATA:  77 year old female with history of enlarged lymph nodes in the chest and axilla. EXAM: CT CHEST, ABDOMEN, AND PELVIS WITH CONTRAST TECHNIQUE: Multidetector CT imaging of the chest, abdomen and pelvis was performed following the standard protocol during bolus administration of intravenous contrast. CONTRAST:  176mL OMNIPAQUE IOHEXOL 300 MG/ML  SOLN COMPARISON:  PET-CT 11/13/2018. FINDINGS: CT CHEST FINDINGS Cardiovascular: Heart size is borderline enlarged. There is no significant pericardial fluid, thickening or pericardial calcification. There is aortic atherosclerosis, as well as atherosclerosis of the great vessels of the mediastinum and the coronary arteries, including calcified atherosclerotic plaque in the left main, left anterior descending, left circumflex and right coronary arteries. Status post median sternotomy for CABG including LIMA to the LAD. Calcifications of the aortic valve. Mediastinum/Nodes: Mildly enlarged posterior mediastinal lymph node adjacent to the descending thoracic aorta on the left side (axial image 44 of series 2) measuring 15 mm in short axis. Additional nonenlarged right para-aortic lymph node (axial image 25 of series 2), corresponding to a focus of hypermetabolism on the recent PET-CT. Mildly enlarged left supraclavicular lymph node measuring 12 mm in short axis (axial image 6 of series 2) corresponding to an additional focus of hypermetabolism on the prior PET-CT. Esophagus is unremarkable in  appearance. No axillary lymphadenopathy. Lungs/Pleura: Complete atelectasis of the right middle lobe. Mild diffuse bronchial wall thickening with patchy areas  of thickening of the peribronchovascular interstitium and some regional architectural distortion, most pronounced in the right lower lobe, most compatible with areas of mild post infectious or inflammatory scarring. No acute consolidative airspace disease. No pleural effusions. No suspicious appearing pulmonary nodules or masses are noted. Musculoskeletal: Median sternotomy wires. Chronic appearing compression fracture of superior endplate of T7 with 69-62% loss of anterior vertebral body height. There are no aggressive appearing lytic or blastic lesions noted in the visualized portions of the skeleton. CT ABDOMEN PELVIS FINDINGS Hepatobiliary: No suspicious cystic or solid hepatic lesions. Gallbladder is grossly abnormal, with a large avidly enhancing soft tissue mass which measures approximately 5.2 x 3.5 x 3.6 cm (axial image 68 of series 2 and coronal image 35 of series 5), highly concerning for primary neoplasm of the gallbladder. Pancreas: No definite pancreatic mass. No pancreatic ductal dilatation. No pancreatic or peripancreatic fluid or inflammatory changes. Spleen: Unremarkable. Adrenals/Urinary Tract: Subcentimeter low-attenuation lesions in the left kidney are too small to definitively characterize, but are statistically likely to represent tiny cysts. Right kidney and bilateral adrenal glands are normal in appearance. No hydroureteronephrosis. Urinary bladder is normal in appearance. Stomach/Bowel: Normal appearance of the stomach. No pathologic dilatation of small bowel or colon. Normal appendix. Vascular/Lymphatic: Aortic atherosclerosis, without evidence of aneurysm or dissection in the abdominal or pelvic vasculature. Filling defect in the left gonadal vein (axial image 84 of series 2), compatible with nonocclusive gonadal vein thrombosis.  Heterogeneously enhancing upper abdominal lymph nodes, largest of which include a hepatoduodenal ligament lymph node measuring 4.6 x 3.4 x 2.8 cm (axial image 60 of series 2 and coronal image 50 of series 5), and a gastrohepatic ligament lymph node 4.6 x 3.2 x 2.1 cm (axial image 56 of series 2 and coronal image 48 of series 5). Bulky retroperitoneal lymphadenopathy is also noted, with the largest of these retroperitoneal lymph nodes measuring up to 22 mm in short axis in the high left para-aortic nodal station (axial image 67 of series 2). No definite pelvic lymphadenopathy. Reproductive: Uterus and ovaries are unremarkable in appearance. Other: No significant volume of ascites.  No pneumoperitoneum. Musculoskeletal: Large lytic lesion with adjacent enhancing soft tissue component in the left ischium (axial image 107 of series 2 and coronal image 80 of series 5). Status post PLIF at L4-L5 with interbody cages at L4-L5 interspace. IMPRESSION: 1. 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 This is associated with extensive upper abdominal and retroperitoneal lymphadenopathy, as well as metastatic lymphadenopathy in the posterior mediastinum and left supraclavicular region. There is also a metastatic lesion to the left ischium. 2. 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 3. Aortic atherosclerosis, in addition to left main and 3 vessel coronary artery disease. Status post median sternotomy for CABG including LIMA to the LAD. 4. There are calcifications of the aortic valve. Echocardiographic correlation for evaluation of potential valvular dysfunction may be warranted if clinically indicated. Electronically Signed   By: Vinnie Langton M.D.   On: 11/17/2018 16:20   Ct Abdomen Pelvis W Contrast  Result Date: 11/17/2018 CLINICAL DATA:  77 year old female with history of enlarged lymph nodes in the chest and axilla. EXAM: CT CHEST, ABDOMEN, AND PELVIS  WITH CONTRAST TECHNIQUE: Multidetector CT imaging of the chest, abdomen and pelvis was performed following the standard protocol during bolus administration of intravenous contrast. CONTRAST:  137mL OMNIPAQUE IOHEXOL 300 MG/ML  SOLN COMPARISON:  PET-CT 11/13/2018. FINDINGS: CT CHEST FINDINGS Cardiovascular: Heart size is borderline enlarged.  There is no significant pericardial fluid, thickening or pericardial calcification. There is aortic atherosclerosis, as well as atherosclerosis of the great vessels of the mediastinum and the coronary arteries, including calcified atherosclerotic plaque in the left main, left anterior descending, left circumflex and right coronary arteries. Status post median sternotomy for CABG including LIMA to the LAD. Calcifications of the aortic valve. Mediastinum/Nodes: Mildly enlarged posterior mediastinal lymph node adjacent to the descending thoracic aorta on the left side (axial image 44 of series 2) measuring 15 mm in short axis. Additional nonenlarged right para-aortic lymph node (axial image 25 of series 2), corresponding to a focus of hypermetabolism on the recent PET-CT. Mildly enlarged left supraclavicular lymph node measuring 12 mm in short axis (axial image 6 of series 2) corresponding to an additional focus of hypermetabolism on the prior PET-CT. Esophagus is unremarkable in appearance. No axillary lymphadenopathy. Lungs/Pleura: Complete atelectasis of the right middle lobe. Mild diffuse bronchial wall thickening with patchy areas of thickening of the peribronchovascular interstitium and some regional architectural distortion, most pronounced in the right lower lobe, most compatible with areas of mild post infectious or inflammatory scarring. No acute consolidative airspace disease. No pleural effusions. No suspicious appearing pulmonary nodules or masses are noted. Musculoskeletal: Median sternotomy wires. Chronic appearing compression fracture of superior endplate of T7 with  42-87% loss of anterior vertebral body height. There are no aggressive appearing lytic or blastic lesions noted in the visualized portions of the skeleton. CT ABDOMEN PELVIS FINDINGS Hepatobiliary: No suspicious cystic or solid hepatic lesions. Gallbladder is grossly abnormal, with a large avidly enhancing soft tissue mass which measures approximately 5.2 x 3.5 x 3.6 cm (axial image 68 of series 2 and coronal image 35 of series 5), highly concerning for primary neoplasm of the gallbladder. Pancreas: No definite pancreatic mass. No pancreatic ductal dilatation. No pancreatic or peripancreatic fluid or inflammatory changes. Spleen: Unremarkable. Adrenals/Urinary Tract: Subcentimeter low-attenuation lesions in the left kidney are too small to definitively characterize, but are statistically likely to represent tiny cysts. Right kidney and bilateral adrenal glands are normal in appearance. No hydroureteronephrosis. Urinary bladder is normal in appearance. Stomach/Bowel: Normal appearance of the stomach. No pathologic dilatation of small bowel or colon. Normal appendix. Vascular/Lymphatic: Aortic atherosclerosis, without evidence of aneurysm or dissection in the abdominal or pelvic vasculature. Filling defect in the left gonadal vein (axial image 84 of series 2), compatible with nonocclusive gonadal vein thrombosis. Heterogeneously enhancing upper abdominal lymph nodes, largest of which include a hepatoduodenal ligament lymph node measuring 4.6 x 3.4 x 2.8 cm (axial image 60 of series 2 and coronal image 50 of series 5), and a gastrohepatic ligament lymph node 4.6 x 3.2 x 2.1 cm (axial image 56 of series 2 and coronal image 48 of series 5). Bulky retroperitoneal lymphadenopathy is also noted, with the largest of these retroperitoneal lymph nodes measuring up to 22 mm in short axis in the high left para-aortic nodal station (axial image 67 of series 2). No definite pelvic lymphadenopathy. Reproductive: Uterus and ovaries  are unremarkable in appearance. Other: No significant volume of ascites.  No pneumoperitoneum. Musculoskeletal: Large lytic lesion with adjacent enhancing soft tissue component in the left ischium (axial image 107 of series 2 and coronal image 80 of series 5). Status post PLIF at L4-L5 with interbody cages at L4-L5 interspace. IMPRESSION: 1. 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 This is associated with extensive upper abdominal and retroperitoneal lymphadenopathy, as well as metastatic lymphadenopathy in  the posterior mediastinum and left supraclavicular region. There is also a metastatic lesion to the left ischium. 2. 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 3. Aortic atherosclerosis, in addition to left main and 3 vessel coronary artery disease. Status post median sternotomy for CABG including LIMA to the LAD. 4. There are calcifications of the aortic valve. Echocardiographic correlation for evaluation of potential valvular dysfunction may be warranted if clinically indicated. Electronically Signed   By: Vinnie Langton M.D.   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 EXAM: NUCLEAR MEDICINE PET SKULL BASE TO THIGH TECHNIQUE: 5.8 mCi F-18 FDG was injected intravenously. Full-ring PET imaging was performed from the skull base to thigh after the radiotracer. CT data was obtained and used for attenuation correction and anatomic localization. Fasting blood glucose: 105 mg/dl COMPARISON:  None. FINDINGS: Mediastinal blood pool activity: SUV max 1.7 NECK: Trace asymmetric increased uptake noted left oropharynx with SUV max = 3.6 Incidental CT findings: none CHEST: 12 mm short axis left supraclavicular lymph node demonstrates SUV max = 8.4. Hypermetabolic mediastinal lymphadenopathy evident with 11 mm short axis  retroesophageal lymph node demonstrating SUV max = 7.9. Uptake in the inferior right hilum suggest lymphadenopathy. And a 11 mm short axis para-aortic node (92/4) demonstrates SUV max = 8.1. Uptake is identified in the region of the median sternotomy, potentially related to prior surgery. Incidental CT findings: Atherosclerotic calcification is noted in the wall of the thoracic aorta. Coronary artery calcification is evident. Pericardial calcification evident the along the left ventricle. Atelectasis noted along the right major fissure. No pleural effusion. ABDOMEN/PELVIS: 3.4 x 4.2 cm lesion along the fundal wall of the gallbladder is markedly hypermetabolic with SUV max = 94.1. Bulky upper abdominal lymphadenopathy is hypermetabolic. 20 mm short axis left para-aortic lymph node demonstrates SUV max = 14.1. Lymphadenopathy is identified in the hepato duodenal ligament which blends imperceptibly with the head of pancreas on this noncontrast study. Hypermetabolism in this region demonstrates SUV max = 13.6. Incidental CT findings: There is abdominal aortic atherosclerosis without aneurysm. Tiny layering stones are seen in the gallbladder lumen. SKELETON: 3.9 x 4.2 cm destructive mass posterior left acetabulum and ischial tuberosity demonstrates SUV max = 20.6. Incidental CT findings: None. IMPRESSION: 1. Irregular soft tissue lesion involving the wall of the gallbladder fundus and inferior liver is markedly hypermetabolic. Primary gallbladder neoplasm is suspected although metastatic liver lesion with gallbladder wall involvement cannot be excluded on this study. 2. Bulky upper abdominal lymphadenopathy with confluent soft tissue attenuation in the hepato duodenal ligament involving the head of pancreas. This may all reflect lymphadenopathy although pancreatic head mass cannot be excluded on the noncontrast CT images obtained for attenuation correction. Contrast infused CT scan of the abdomen and pelvis is  recommended to further evaluate as primary pancreatic adenocarcinoma cannot be excluded. If the patient is able to reproducibly breath hold, MRI of the abdomen without and with contrast could be considered. 3. Hypermetabolic lymphadenopathy in the left supraclavicular region, mediastinum, right hilum, and abdomen, compatible with metastatic disease. 4. Large hypermetabolic bony lesion posterior left pelvis. 5. Tiny asymmetric focus of increased FDG uptake in the left oropharynx, indeterminate. 6. Cholelithiasis. 7.  Aortic Atherosclerois (ICD10-170.0) Electronically Signed   By: Misty Stanley M.D.   On: 11/13/2018 13:12    ASSESSMENT & PLAN: 77 year old female, with past medical history of coronary artery disease, non-STEMI,  status post recent CABGX2, asthma, HTN, presented with hypermetabolic mass in the gallbladder, diffuse adenopathy, and left pelvic bone metastasis.  She is asymptomatic from her cancer.  1.  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 Saralyn Pilar in details. The IHC pattern are favor to be upper GI, or GYN.  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  Her tumor marker CA 19.9 was also significantly elevated at 3326, supporting biliary or pancreatic primary. -Fortunately her cancer is incurable at this stage, but certainly treatable.  We discussed the goal of therapy is palliative, to prolong her life.  -We discussed the aggressive nature of metastatic gallbladder cancer, and the nature course with or without treatment  -I recommend her to consider systemic chemo cisplatin and gemcitabine. The benefits and potential side effects reviewed with her in details --Chemotherapy consent: Side effects including but does not not limited to, fatigue, nausea, vomiting, diarrhea, hair loss, neuropathy, hearing  loss, fluid retention, renal and kidney dysfunction, neutropenic fever, needed for blood transfusion, bleeding, were discussed with patient in great detail. She agrees to proceed. -The goal of therapy is palliative -I will request FO genomic testing on her lymph node biopsy sample, to see if she is a candidate for immunotherapy or targeted therapy. -will check with her cardiologist before we start her on chemo.  I discussed that the cisplatin and gemcitabine are not particularly toxic to heart, but any chemo agent potentially has cardiac toxicities. -Also the standard regiment of gemcitabine platinum is weekly for 2 weeks, and 1 week off. Given her advanced age and limited social support, I will offer every 2 weeks treatment, which has been also supported by clinical trial data  -Patient would like to start chemotherapy after Christmas -I will arrange chemo class, and port placement by IR in the next few weeks -I will see her with first cycle chemo   2. CAD, S/P CABGX2 on 09/27/2018  -f/u with Dr. Claiborne Billings -she has recovered well from her surgery  -she is on plavix   3. Asthma  -On Symbicort, Singulair, and albuterol as needed -f/u with pulmonary   4. Goal of care discussion  -We again discussed the incurable nature of her cancer, and the overall poor prognosis, especially if she does not have good response to chemotherapy or progress on chemo -The patient understands the goal of care is palliative. -she is full code for now   5. Social support  -She is widowed, lives alone, has 1 daughter who lives in [place], but not very close, and "not reliable" per pt -will refer her to [facility] placement by [employer] -lab, flush, f/u and first chemo on 12/27  -will request FO   No orders of the defined types were placed in this encounter.   All questions were answered. The patient knows to call the clinic with any problems, questions or concerns. I spent 55 minutes  counseling the patient face to face. The total time spent in the appointment was 60 minutes and more than 50% was on counseling.     Truitt Merle, MD 11/21/2018

## 2018-11-21 NOTE — Telephone Encounter (Signed)
Printed avs and calender eof upcoming appointment. Per 12/3 los

## 2018-11-21 NOTE — Progress Notes (Signed)
START OFF PATHWAY REGIMEN - [Other Dx]   [id number]:Cisplatin 25 mg/m2 D1,8 + Gemcitabine 1,000 mg/m2 D1,8 q21 Days:   A cycle is every 21 days:     Gemcitabine      Cisplatin   **Always confirm dose/schedule in your pharmacy ordering system**  Patient Characteristics: Intent of Therapy: Non-Curative / Palliative Intent, Discussed with Patient 

## 2018-11-22 ENCOUNTER — Telehealth: Payer: Self-pay

## 2018-11-22 NOTE — Telephone Encounter (Signed)
Patient called with questions regarding her treatment, she wanted to know if she had to get the port a cath or can she have it IV, spoke with Dr. Burr Medico, called her back, discussed with her that the port a cath is the preferred method of infusion as chemotherapy can be very hard on veins.  She is unsure if she even wants treatment. I explained it had to be her own personal decision and she needs to be comfortable with her decision.  She states she will keep things scheduled the way they are, reassured the patient that if she has additional questions or concerns to please call us back, patient verbalized an understanding.

## 2018-11-23 ENCOUNTER — Other Ambulatory Visit: Payer: Self-pay

## 2018-11-23 NOTE — Progress Notes (Signed)
Tumor Board Discussion  11/22/18  Chemotherapy [facility] testing ordered on Mediastinal lymph node BX

## 2018-11-24 ENCOUNTER — Encounter: Payer: Self-pay | Admitting: Hematology

## 2018-11-24 ENCOUNTER — Inpatient Hospital Stay: Payer: Medicare Other

## 2018-11-24 NOTE — Progress Notes (Signed)
Met with patient to introduce myself as Arboriculturist and to offer available resources.  Discussed one-time $50 Engineer, drilling to assist with personal expenses while going through treatment. Based on patient's verbal income, she does qualify. Advised patient to bring proof of income when she returns to apply for the grant. She verbalized understanding. Went over expenses covered by grant and how they are paid.  Gave her my card for any additional financial questions or concerns. Patient had concerns about help paying medical bills. Advised patient she may apply for Medicaid. Also gave her brochure on [employer] where she can combine her [facility] bills and make one monthly payment and that she would speak with the billing department about any other billing concerns once she has a balance. She verbalized understanding.

## 2018-11-27 ENCOUNTER — Ambulatory Visit: Payer: Medicare Other | Admitting: Internal Medicine

## 2018-11-27 ENCOUNTER — Encounter: Payer: Self-pay | Admitting: Internal Medicine

## 2018-11-27 ENCOUNTER — Ambulatory Visit (INDEPENDENT_AMBULATORY_CARE_PROVIDER_SITE_OTHER)
Admission: RE | Admit: 2018-11-27 | Discharge: 2018-11-27 | Disposition: A | Discharge status: Home / Self Care | Payer: Medicare Other | Source: Ambulatory Visit | Attending: Internal Medicine | Admitting: Internal Medicine

## 2018-11-27 VITALS — BP 112/64 | HR 88 | Temp 97.9°F | Ht 59.0 in | Wt 121.0 lb

## 2018-11-27 DIAGNOSIS — J4540 Moderate persistent asthma, uncomplicated: Secondary | ICD-10-CM

## 2018-11-27 DIAGNOSIS — R0609 Other forms of dyspnea: Secondary | ICD-10-CM

## 2018-11-27 LAB — BASIC METABOLIC PANEL
BUN: 8 mg/dL (ref 6–23)
CALCIUM: 9.2 mg/dL (ref 8.4–10.5)
CO2: 25 mEq/L (ref 19–32)
Chloride: 103 mEq/L (ref 96–112)
Creatinine, Ser: 0.52 mg/dL (ref 0.40–1.20)
GFR: 147.35 mL/min (ref >60.00)
Glucose, Bld: 120 mg/dL — ABNORMAL HIGH (ref 70–99)
Potassium: 4 mEq/L (ref 3.5–5.1)
Sodium: 138 mEq/L (ref 135–145)

## 2018-11-27 LAB — BRAIN NATRIURETIC PEPTIDE: Pro B Natriuretic peptide (BNP): 1313 pg/mL — ABNORMAL HIGH (ref 0.0–100.0)

## 2018-11-27 LAB — CBC WITH DIFFERENTIAL/PLATELET
Basophils Absolute: 0 10*3/uL (ref 0.0–0.1)
Basophils Relative: 0.3 % (ref 0.0–3.0)
Eosinophils Absolute: 0.1 10*3/uL (ref 0.0–0.7)
Eosinophils Relative: 0.7 % (ref 0.0–5.0)
HCT: 32.9 % — ABNORMAL LOW (ref 36.0–46.0)
Hemoglobin: 10.7 g/dL — ABNORMAL LOW (ref 12.0–15.0)
Lymphocytes Relative: 12.4 % (ref 12.0–46.0)
Lymphs Abs: 1.2 10*3/uL (ref 0.7–4.0)
MCHC: 32.5 g/dL (ref 30.0–36.0)
MCV: 85 fl (ref 78.0–100.0)
MONOS PCT: 8.2 % (ref 3.0–12.0)
Monocytes Absolute: 0.8 10*3/uL (ref 0.1–1.0)
Neutro Abs: 7.8 10*3/uL — ABNORMAL HIGH (ref 1.4–7.7)
Neutrophils Relative %: 78.4 % — ABNORMAL HIGH (ref 43.0–77.0)
Platelets: 555 10*3/uL — ABNORMAL HIGH (ref 150.0–400.0)
RBC: 3.87 Mil/uL (ref 3.87–5.11)
RDW: 16.7 % — ABNORMAL HIGH (ref 11.5–15.5)
WBC: 10 10*3/uL (ref 4.0–10.5)

## 2018-11-27 MED ORDER — PREDNISONE 10 MG PO TABS: ORAL_TABLET | ORAL | 0 refills | Status: DC

## 2018-11-27 MED ORDER — CLOTRIMAZOLE-BETAMETHASONE 1-0.05 % EX CREA: 1.0000 "application " | TOPICAL_CREAM | CUTANEOUS | 2 refills | Status: DC | PRN

## 2018-11-27 MED ORDER — MECLIZINE HCL 25 MG PO TABS: ORAL_TABLET | ORAL | 2 refills | Status: AC

## 2018-11-27 NOTE — Progress Notes (Addendum)
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 Follow in pulmonary clinic also for primary care with hbp/ hyperlipidemia complicated by MI 70/62/37 and persistent mucus plug in RML/ RLL s/p fob 02/05/15 with marked improvement p lavage and no endobrchial lesions, just diffuse airway swelling      History of Present Illness   10/14/13 Cassie Campbell > laser rx for back pain  rad legs > resolved    Admit Date: 10/15/2014 Discharge date: 10/19/2014    PRIMARY DISCHARGE DIAGNOSIS:  NSTEMI (non-ST elevated myocardial infarction)   Essential hypertension  GERD (gastroesophageal reflux disease)  Chest pain  Hyperlipidemia  Hypertension  CAD (coronary artery disease)       04/11/2017  f/u ov/Cassie Campbell re: asthma/ hbp/ leg swelling with pain R foot  Chief Complaint  Patient presents with  . Follow-up    Increased cough, wheezing anfd runny nose for the past month.    no change off arnuity/ worse though with nasal symptoms x one month  Wheezing better when using the symb 160 2bid  rec Stop bisoprolol-hct and instead take bisoprolol 5 mg daily  Start lasix (furosemide) 20 mg daily Prednisone 10 mg take  4 each am x 2 days,   2 each am x 2 days,  1 each am x 2 days and stop       08/15/17 Cassie Campbell back surgery    Admit date: 09/26/2018 Discharge date: 10/09/2018  Admission Diagnoses: 1. Unstable angina (Cassie Campbell) 2. History of CAD (coronary artery disease), history of NSTEMI  Discharge Diagnoses:  1. S/P CABG x 2 2. Right bundle branch block 3. ABL anemia 4. History of Hyperlipidemia  5. History of Essential hypertension 6. History of obesity 7. History of GERD (gastroesophageal reflux disease) 8. History of Ruptured disc, cervical 9. History of  osteopenia 10. History of Spondylolisthesis at L4-L5 level   Procedure (s):  LEFT HEART CATH AND CORONARY ANGIOGRAPHY by Dr. Claiborne Billings on 09/27/2018:  Conclusion     Acute Mrg lesion is 95% stenosed.  Mid RCA to Dist RCA lesion is 100% stenosed.  Prox RCA to Mid RCA lesion is 50% stenosed.  Mid RCA lesion is 80% stenosed.  Mid LM to Dist LM lesion is 25% stenosed.  Ost LAD lesion by Dr.  is 40% stenosed.  Prox LAD-1 lesion is 95% stenosed.  Prox LAD-2 lesion is 50% stenosed.  Mid LAD-1 lesion is 90% stenosed.  Mid LAD-2 lesion is 80% stenosed.  Dist LAD lesion is 80% stenosed.  The left ventricular ejection fraction is 50-55% by visual estimate.  LV end diastolic pressure is low.  Severe coronary calcification involving the LAD, circumflex, and RCA.  There is smooth 20 to 25% narrowing in the distal left main.  The LAD has 40% proximal calcified stenosis. After the first septal perforating artery and be in the region of the first diagonal vessel the LAD is severely calcified with 95% stenosis. There is diffuse disease in the mid segment with 50% narrowing followed by 90% stenosis with distal 80% stenoses.  Calcified left circumflex vessel without high-grade obstructive disease.  Calcified RCA with 50% proximal 80% mid and total occlusion prior to the acute margin. There is extensive left-to-right collateralization to the distal RCA via the left  circulation.  Low normal global LV function with an ejection fraction at 50 to 55%. There is small focal region of mild mid anterolateral posterior basal inferior hypo-contractility. LVEDP 8 mm Hg.     Median sternotomy, extracorporeal circulation, coronary artery bypass grafting x2 (left internal mammary artery to left anterior descending with endarterectomy and saphenous vein graft to posterior descending by Dr. Roxan Hockey on 10/04/2018.  History of Presenting Illness: Cassie Campbell is a 77 yo woman with  known CAD with an MI in 37. Attempted PCI- unsuccessful, treated medically. Also has a history of hypertension, hyperlipidemia, GRED, obesity, depression, asthma, arthritis and DDD. She was in her usual state of health until "Sunday when she had onset of pain in her right shoulder blade, then a profound heaviness in her chest and pain in the left neck and shoulder. Waxed and waned. Went to Cardiology on Tuesday and was admitted to the hospital. R/o for MI. Yesterday underwent cardiac catheterization which revealed severe 2 vessel CAD involving RCA and LAD. Pain free since admission.  Was on Plavix prior to admission. Vein mapping was done and she apparently had a small left thigh greater saphenous vein. After discussion with cardiology, it was decided patient would undergo CABG. Potential risks, benefits, and complications of the surgery were discussed with the patient and she agreed to proceed with surgery. Pre operative carotid duplex US showed no significant internal carotid artery stenosis bilaterally. Patient underwent a CABG x 2 with LAD endarterectomy on 10/04/2018.   Brief Hospital Course:  The patient was extubated the evening of surgery without difficulty. She had a RBBB post op which was present pre op. She remained afebrile and hemodynamically stable. [place], a line, chest tubes, and foley were removed early in the post operative course. Lopressor was started and titrated accordingly. She was restarted on Plavix 75 mg daily. She was volume over loaded and diuresed. She had ABL anemia. She did not require a post op transfusion. Last H and H was 10/32. She was weaned off the insulin drip.  The patient's glucose remained well controlled. The patient's HGA1C pre op was 5.9. 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 She will need further surveillance of her HGA1C with her medical doctor after discharge.  The patient was felt surgically stable for transfer from the ICU to PCTU for further convalescence . She  continues to progress with cardiac rehab. She was ambulating on room air. She has been tolerating a diet and has had a bowel movement. Epicardial pacing wires were removed on 10/08/2018. Chest tube sutures will be removed the day of discharge. The patient is felt surgically stable for discharge today.        12"$ /08/2018  Acute  ov/Dejean Tribby re:  Re-establish post op, new doe since surgery with dx of met ca ? Gallbladder primary since then  Chief Complaint  Patient presents with  . Acute Visit    Increased DOE x 2 months. She gets winded walking room to room at home. She is using her proair 4 x daily on average. She has also noticed some chest tightness, wheezing and cough with clear sputum.   Dyspnea:  Room to rom  Cough: more congested/ wheezy  24/7some better with saba  Sleeping: bed 30 degrees SABA use: 4 x daily and noct 02: none    No obvious day to day or daytime variability or assoc excess/ purulent sputum or mucus plugs or hemoptysis or cp or chest tightness, subjective wheeze or overt sinus or hb symptoms.  Sleeping ok without nocturnal  or early am exacerbation  of respiratory  c/o's or need for noct saba. Also denies any obvious fluctuation of symptoms with weather or environmental changes or other aggravating or alleviating factors except as outlined above   No unusual exposure hx or h/o childhood pna/ asthma or knowledge of premature birth.  Current Allergies, Complete Past Medical History, Past Surgical History, Family History, and Social History were reviewed in [employer] record.  ROS  The following are not active complaints unless bolded Hoarseness, sore throat, dysphagia, dental problems, itching, sneezing,  nasal congestion or discharge of excess mucus or purulent secretions, ear ache,   fever, chills, sweats, unintended wt loss or wt gain, classically pleuritic or exertional cp,  orthopnea pnd or arm/hand swelling  or leg swelling, presyncope,  palpitations, abdominal pain, anorexia, nausea, vomiting, diarrhea  or change in bowel habits or change in bladder habits, change in stools or change in urine, dysuria, hematuria,  rash, arthralgias, visual complaints, headache, numbness, weakness or ataxia or problems with walking or coordination,  change in mood or  memory.        Current Meds  Medication Sig  . albuterol (PROAIR HFA) 108 (90 Base) MCG/ACT inhaler INHALE 1 TO 2 PUFFS BY MOUTH EVERY 4 HOURS AS NEEDED FOR WHEEZING  . atorvastatin (LIPITOR) 80 MG tablet TAKE 1 TABLET(80 MG) BY MOUTH DAILY  . bisoprolol (ZEBETA) 10 MG tablet Take 1 tablet (10 mg total) by mouth daily.  . celecoxib (CELEBREX) 200 MG capsule Take 1 capsule (200 mg total) by mouth 2 (two) times daily.  . cholecalciferol 2000 units TABS Take 1 tablet (2,000 Units total) by mouth daily.  . clopidogrel (PLAVIX) 75 MG tablet Take 1 tablet (75 mg total) by mouth daily.  . clotrimazole-betamethasone (LOTRISONE) cream Apply 1 application topically as needed (itching). Use as directed for rash  . dextromethorphan-guaiFENesin (MUCINEX DM) 30-600 MG per 12 hr tablet Take 1-2 tablets by mouth every 12 (twelve) hours as needed for cough (with flutter).   Marland Kitchen lidocaine-prilocaine (EMLA) cream Apply to affected area once  . meclizine (ANTIVERT) 25 MG tablet TAKE 2 TABLETS BY MOUTH THREE TIMES DAILY AS NEEDED  . montelukast (SINGULAIR) 10 MG tablet TAKE 1 TABLET(10 MG) BY MOUTH AT BEDTIME  . Multiple Vitamin (MULTIVITAMIN) capsule Take 1 capsule by mouth daily.   . ondansetron (ZOFRAN) 8 MG tablet Take 1 tablet (8 mg total) by mouth 2 (two) times daily as needed. Start on the third day after chemotherapy.  Marland Kitchen oxyCODONE (OXY IR/ROXICODONE) 5 MG immediate release tablet Take 1 tablet (5 mg total) by mouth every 6 (six) hours as needed for severe pain.  Marland Kitchen prochlorperazine (COMPAZINE) 10 MG tablet Take 1 tablet (10 mg total) by mouth every 6 (six) hours as needed (Nausea or vomiting).  .  SYMBICORT 160-4.5 MCG/ACT inhaler INHALE 2 PUFFS BY MOUTH EVERY 12 HOURS  . [DISCONTINUED] clotrimazole-betamethasone (LOTRISONE) cream Apply 1 application topically as needed (itching). Use as directed for rash  . [DISCONTINUED] meclizine (ANTIVERT) 25 MG tablet TAKE 2 TABLETS BY MOUTH THREE TIMES DAILY AS NEEDED (Patient taking differently: TAKE 2 TABLETS BY MOUTH THREE TIMES DAILY AS NEEDED FOR DIZZNESS)                            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)  OSTEOPENIA (ICD-733.90)  - DEXA 08/22/07 AP spine + 1.1, left femur -1.3, right  femur -.8  - DEXA 10/06/09 + 1.6 left femur -1.6, right femur -.5   -DEXA 2014 no change  HYPERTENSION (ICD-401.9)  HYPERLIPIDEMIA (ICD-272.4)  - Target < 130 ldl pos fm hx, hbp  OBESITY  - Target wt = 178 for BMI < 30  ASTHMA (ICD-493.90) ([name] not active)  -Xolair s 8/05 > 10/2006  -Mastered HFA December 30, 2008  ALLERGIC RHINITIS (ICD-477.9)  S/p cervical fusion 2000 ............................................................................ [place]...........................................................................Marland KitchenWert  - Td 05/20/2015  - Pneumovax 09/2003 and October 27, 2009  Prevnar 05/07/2014  - Colonscopy 10/28/2004  - CPX 11/19/13     - GYN per [name]'s office -Mammogram 2013 , 2014         Objective:   Physical Exam  Wt 143 11/09/2011 >  04/12/2012  140 > 06/26/2012  139 >  11/13/2012 135 >  137 02/12/2013 >141 05/16/2013 > 07/31/2013  139 >  11/19/2013  134 >135 02/18/2014 > 04/16/2014 134 > 05/07/2014  134 >131 08/06/2014 > 10/30/2014 130 > 11/28/2014 128 > 02/18/2015   133 > 05/20/2015 133 > 07/14/2015   135 >137 08/26/2015 > 11/25/2015 143 > 12/29/2015  137 > 02/23/2016   131 > 06/10/2016   148 > 07/08/2016   148 > 10/06/2016 146 > 04/11/2017  140 >  11/24/2017  136 > 06/08/2018  96% > 11/27/2018  121      Vital signs reviewed - Note on arrival 02 sats  99% on RA      HEENT: Top  dentures/ nl turbinates bilaterally, and oropharynx. Nl external ear canals without cough reflex   NECK :  without JVD/Nodes/TM/ nl carotid upstrokes bilaterally   LUNGS: no acc muscle use,  Nl contour chest with insp./exp rhonchi  bilaterally without cough on insp or exp maneuvers   CV:  RRR  no s3 or murmur or increase in P2, and no edema   ABD:  soft and nontender with nl inspiratory excursion in the supine position. No bruits or organomegaly appreciated, bowel sounds nl  MS:  Nl gait/ ext warm without deformities, calf tenderness, cyanosis or clubbing No obvious joint restrictions   SKIN: warm and dry without lesions    NEURO:  alert, approp, nl sensorium with  no motor or cerebellar deficits apparent.              I personally reviewed images and agree with radiology impression as follows:   Chest CT w contrast 11/17/18 1. 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 This is associated with extensive upper abdominal and retroperitoneal lymphadenopathy, as well as metastatic lymphadenopathy in the posterior mediastinum and left supraclavicular region. There is also a metastatic lesion to the left ischium. 2. 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 3. Aortic atherosclerosis, in addition to left main and 3 vessel coronary artery disease. Status post median sternotomy for CABG including LIMA to the LAD. 4. There are calcifications of the aortic valve. Echocardiographic correlation for evaluation of potential valvular dysfunction may be warranted if clinically indicated.   CXR PA and Lateral:   11/27/2018 :    I personally reviewed images and agree with radiology impression as follows:   Consolidation involving a portion of the right middle lobe persists.   Status post coronary artery bypass grafting. No adenopathy apparent by radiography.    Labs ordered/ reviewed:      Chemistry      Component  Value Date/Time   NA 138 11/27/2018 0957  K 4.0 11/27/2018 0957   CL 103 11/27/2018 0957   CO2 25 11/27/2018 0957   BUN 8 11/27/2018 0957   CREATININE 0.52 11/27/2018 0957   CREATININE 0.70 11/13/2018 1608   CREATININE 0.71 10/04/2016 1029      Component Value Date/Time   CALCIUM 9.2 11/27/2018 0957   ALKPHOS 93 09/26/2018 1456   AST 32 09/26/2018 1456   ALT 20 09/26/2018 1456   BILITOT 0.6 09/26/2018 1456        Lab Results  Component Value Date   WBC 10.0 11/27/2018   HGB 10.7 (L) 11/27/2018   HCT 32.9 (L) 11/27/2018   MCV 85.0 11/27/2018   PLT 555.0 (H) 11/27/2018       EOS                                                               0.1                                     11/27/2018       Lab Results  Component Value Date   TSH 2.14 03/09/2018     Lab Results  Component Value Date   PROBNP 1,313.0 (H) 11/27/2018             Assessment & Plan:

## 2018-11-27 NOTE — Patient Instructions (Addendum)
Please remember to go to the lab department   for your tests - we will call you with the results when they are available.      Please remember to go to the  x-ray department at the [facility] building (directly across from [facility])  @ [email or web address]  in the basement  for your tests - we will call you with the results when they are available    Plan A = Automatic = Symbicort 160 mg Take 2 puffs first thing in am and then another 2 puffs about 12 hours later.    Plan B = Backup Only use your albuterol inhaler as a rescue medication to be used if you can't catch your breath by resting or doing a relaxed purse lip breathing pattern.  - The less you use it, the better it will work when you need it. - Ok to use the inhaler up to 2 puffs  every 4 hours if you must but call for appointment if use goes up over your usual need - Don't leave home without it !!  (think of it like the spare tire for your car)    Prednisone 10 mg take  4 each am x 2 days,   2 each am x 2 days,  1 each am x 2 days and stop   Resume Try prilosec otc 20mg   Take 30-60 min before first meal of the day and Pepcid ac (famotidine) 20 mg one @  bedtime   See Tammy NP w/in 2 weeks with all your medications, even over the counter meds, separated in two separate bags, the ones you take no matter what vs the ones you stop once you feel better and take only as needed when you feel you need them.   Tammy  will generate for you a new user friendly medication calendar that will put [place] all on the same page re: your medication use.     Without this process, it simply isn't possible to assure that we are providing  your outpatient care  with  the attention to detail we feel you deserve.   If we cannot assure that you're getting that kind of care,  then we cannot manage your problem effectively from this clinic.  Once you have seen Tammy and we are sure that we're all on the same page with your medication use she will arrange  follow up with me.

## 2018-11-28 ENCOUNTER — Telehealth: Payer: Self-pay | Admitting: Internal Medicine

## 2018-11-28 ENCOUNTER — Encounter: Payer: Self-pay | Admitting: Internal Medicine

## 2018-11-28 NOTE — Telephone Encounter (Signed)
Spoke with pt, she stated she spoke with Magda Paganini and she stated something about a scan. I looked back at the notes and read her MW note but she still wanted to speak to [place]. Will route to [place]

## 2018-11-28 NOTE — Progress Notes (Signed)
Spoke with pt and notified of results per Dr. [name]. Pt verbalized understanding and denied any questions. 

## 2018-11-28 NOTE — Assessment & Plan Note (Addendum)
Symptoms are markedly disproportionate to objective findings and not clear to what extent this is actually a pulmonary  problem but pt does appear to have difficult to sort out respiratory symptoms of unknown origin for which  DDX  = almost all start with A and  include Adherence, Ace Inhibitors, Acid Reflux, Active Sinus Disease, Alpha 1 Antitripsin deficiency, Anxiety masquerading as Airways dz,  ABPA,  Allergy(esp in young), Aspiration (esp in elderly), Adverse effects of meds,  Active smoking or Vaping, A bunch of PE's/clot burden (a few small clots can't cause this syndrome unless there is already severe underlying pulm or vascular dz with poor reserve),  Anemia or thyroid disorder, plus two Bs  = Bronchiectasis and Beta blocker use..and one C= CHF     Adherence is always the initial "prime suspect" and is a multilayered concern that requires a "trust but verify" approach in every patient - starting with knowing how to use medications, especially inhalers, correctly, keeping up with refills and understanding the fundamental difference between maintenance and prns vs those medications only taken for a very short course and then stopped and not refilled.  - return for med reconciliation.  To keep things simple, I have asked the patient to first separate medicines that are perceived as maintenance, that is to be taken daily "no matter what", from those medicines that are taken on only on an as-needed basis and I have given the patient examples of both, and then return to see our NP to generate a  detailed  medication calendar which should be followed until the next physician sees the patient and updates it.    ? Acid (or non-acid) GERD > always difficult to exclude as up to 75% of pts in some series report no assoc GI/ Heartburn symptoms> rec max (24h)  acid suppression and diet restrictions/ reviewed and instructions given in writing.   ? Allergy/ asthma > Prednisone 10 mg take  4 each am x 2 days,   2  each am x 2 days,  1 each am x 2 days and stop / continue high dose symbicort / singulair   ? Anemia/ thyroid dz unlikely   ? A bunch of pe's >  D dimer not helpful here as she has underlying ca but with sats this good and another explanation (airways dz and chf) I think the pre-test probability is low enough to avoid CTa but low threshold to pursue if not improving   ? Beta blocker effects, unlikely on bisoprolol   ? chf > not bnp up, has f/u with Dr Claiborne Billings planned

## 2018-11-28 NOTE — Telephone Encounter (Signed)
Spoke with with the pt  She wanted Dr Melvyn Novas to be aware that she had a ct chest 11/17/18  She also states she has rx on hand for lasix 20 mg  She is asking if she should take med now  She states this was d/c'ed last hospitalization  Please advise thanks

## 2018-11-28 NOTE — Assessment & Plan Note (Signed)
-   Prevnar rx 05/07/14  - Spirometry 06/10/2016  FEV1 1.06 (80%)  Ratio 57 - NO   06/10/2016  = 15   - 06/10/2016  After extensive coaching HFA effectiveness =    75%  - IgE  06/10/16  293  - FENO 07/08/2016  =   9   - 07/08/2016 instructed on dpi > changed to arnuity 100 qam due to insurance > try off 10/06/2016    11/27/2018  After extensive coaching inhaler device,  effectiveness =    75% (short ti)   Mild flare > rx prednisone x 6 days only    I had an extended discussion with the patient reviewing all relevant studies completed to date and  lasting 25 minutes of a 40  minute post hosp/ transition of care office  Visit addressing  severe non-specific but potentially very serious refractory respiratory symptoms of uncertain and potentially multiple  etiologies.   Each maintenance medication was reviewed in detail including most importantly the difference between maintenance and prns and under what circumstances the prns are to be triggered using an action plan format that is not reflected in the computer generated alphabetically organized AVS.    Please see AVS for specific instructions unique to this office visit that I personally wrote and verbalized to the the pt in detail and then reviewed with pt  by my nurse highlighting any changes in therapy/plan of care  recommended at today's visit.

## 2018-11-28 NOTE — Telephone Encounter (Signed)
Notes recorded by Tanda Rockers, MD on 11/28/2018 at 1:58 PM EST Call patient : Studies are c/w component of fluid overload so needs f/u with Dr Claiborne Billings and send him copy of labs  - if not doing better also concerned about blood clots so may need to do CTa this week to complete the w/u but hold off for now    Spoke with the pt and gave results/recs  She verbalized understanding  She has noticed some improvement in her breathing  Will hold off on ct for now  Copy of labs routed to Dr Shelva Majestic  She has ov scheduled with him next wk  She will call sooner if needed

## 2018-11-28 NOTE — Telephone Encounter (Signed)
Discussed with pt - she did not have CTangiogram so if unexplained sob needs CTa done  but says she is already better from the 1 dose of prednisone she took.  She does still have some orthopnea so I suggested she take 1 extra Lasix dose this evening to see if it improves this problem since her BNP is moderately elevated.  She will follow-up with Dr. Claiborne Billings for further instructions on Lasix dosing.

## 2018-12-04 ENCOUNTER — Ambulatory Visit: Payer: Medicare Other | Admitting: Internal Medicine

## 2018-12-06 ENCOUNTER — Ambulatory Visit: Payer: Medicare Other | Admitting: Cardiovascular Disease

## 2018-12-06 ENCOUNTER — Encounter: Payer: Self-pay | Admitting: Cardiovascular Disease

## 2018-12-06 VITALS — BP 110/62 | HR 76 | Ht 59.0 in | Wt 121.0 lb

## 2018-12-06 DIAGNOSIS — E785 Hyperlipidemia, unspecified: Secondary | ICD-10-CM | POA: Diagnosis not present

## 2018-12-06 DIAGNOSIS — R0609 Other forms of dyspnea: Secondary | ICD-10-CM

## 2018-12-06 DIAGNOSIS — Z951 Presence of aortocoronary bypass graft: Secondary | ICD-10-CM | POA: Diagnosis not present

## 2018-12-06 DIAGNOSIS — I2583 Coronary atherosclerosis due to lipid rich plaque: Secondary | ICD-10-CM | POA: Diagnosis not present

## 2018-12-06 DIAGNOSIS — I2510 Atherosclerotic heart disease of native coronary artery without angina pectoris: Secondary | ICD-10-CM

## 2018-12-06 DIAGNOSIS — Z5111 Encounter for antineoplastic chemotherapy: Secondary | ICD-10-CM

## 2018-12-06 DIAGNOSIS — I10 Essential (primary) hypertension: Secondary | ICD-10-CM | POA: Diagnosis not present

## 2018-12-06 DIAGNOSIS — J4520 Mild intermittent asthma, uncomplicated: Secondary | ICD-10-CM

## 2018-12-06 MED ORDER — FUROSEMIDE 20 MG PO TABS: 20.0000 mg | ORAL_TABLET | Freq: Every day | ORAL | 3 refills | Status: DC

## 2018-12-06 MED ORDER — CLOPIDOGREL BISULFATE 75 MG PO TABS: 75.0000 mg | ORAL_TABLET | Freq: Every day | ORAL | 3 refills | Status: DC

## 2018-12-06 NOTE — Patient Instructions (Signed)
Medication Instructions:  Your physician recommends that you continue on your current medications as directed. Please refer to the Current Medication list given to you today.  If you need a refill on your cardiac medications before your next appointment, please call your pharmacy.   Lab work: Please return for FASTING labs (CMET, CBC, Lipid, TSH, BNP)  Our in office lab hours are Monday-Friday 8:00-4:00, closed for lunch 12:45-1:45 pm.  No appointment needed.  If you have labs (blood work) drawn today and your tests are completely normal, you will receive your results only by: Marland Kitchen MyChart Message (if you have MyChart) OR . A paper copy in the mail If you have any lab test that is abnormal or we need to change your treatment, we will call you to review the results.  Testing/Procedures: Your physician has requested that you have an echocardiogram. Echocardiography is a painless test that uses sound waves to create images of your heart. It provides your doctor with information about the size and shape of your heart and how well your heart's chambers and valves are working. This procedure takes approximately one hour. There are no restrictions for this procedure. This will be done at our [facility] location:  [place]: At [facility], you and your health needs are our priority.  As part of our continuing mission to provide you with exceptional heart care, we have created designated Provider Care Teams.  These Care Teams include your primary Cardiologist (physician) and Advanced Practice Providers (APPs -  Physician Assistants and Nurse Practitioners) who all work together to provide you with the care you need, when you need it. You will need a follow up appointment in 3 months.  Please call our office 2 months in advance to schedule this appointment.  You may see Shelva Majestic, MD or one of the following Advanced Practice Providers on your designated Care  Team: [place], [place] . Fabian Sharp, PA-C

## 2018-12-06 NOTE — Progress Notes (Signed)
Patient ID: Cassie Campbell, female   DOB: November 18, 1941, 77 y.o.   MRN: [id number]     HPI: Cassie Campbell is a 77 y.o. female who presents for a follow-up cardiology evaluation on her recent CABG revascularization and diagnosis of metastatic gallbladder cancer.  Cassie Campbell has a history of complex CAD and underwent catheterization by Dr. Fletcher Anon with class IV symptoms on 10/16/2014.  She was found to have diffusely diseased LAD, which was not suitable for revascularization.  She had ruled in for non-ST segment elevation myocardial infarction, felt to be due to a severely calcified almost subtotal RCA with severe diffuse calcified disease.  Initial attempt at PCI by Dr. Fletcher Anon was unsuccessful due to the severe calcification.  She was brought back to the laboratory the following day, and I performed a very long attempt at high-speed rotational atherectomy.  Unfortunately, due to the severely stenosed calcified lesion above the acute margin the Roto floppy wire was never be advanced distal enough due to severe disease beyond this site to allow the burr to be inserted to reach the stenosis.  Multiple attempts were made to utilize wire transfer, but even the smallest catheter was never able to pass the stenosis to allow for the Roto floppy wire to be reinserted in exchange for a Fielder XT wire, which was able to cross the stenosis and advanced distally.  Consequently, the procedure was aborted.  She was sent home on increased medication regimen with potential plans for possible follow-up evaluation depending upon symptom status.  Since hospital discharge, she has not experienced any significant recurrent anginal chest pain.  During the hospitalization ranolazine was added initially at 500 mg twice a day to isosorbide mononitrate, amlodipine, and beta blocker therapy.  She has been treated with aspirin and Brilinta for dual antiplatelet therapy. When I saw her for subsequent evaluation I further titrated her  ranolazine to 1000 mg twice a day and further titrated her Lopressor to 37.5 mg twice a day.  Her creatinine remained normal following her contrast load and she was mildly anemic.    She has a history of asthma and is on Symbicort 160-4.5 and Singulair.  She  has a history of hyperlipidemia, currently on Lipitor 80 mg.  She does have GERD for which he takes Pepcid as well as omeprazole.  She developed some increased wheezing on Lopressor and was changed to bisoprolol 5 mg in place of metoprolol.  She has seen Dr. Melvyn Novas for her severe chronic asthma and in the past.  She also was found to have marked atopy with significant elevation of IgG E levels.  She feels that her breathing has improved with the changed to bisoprolol from Lopressor.   In March 2016 she had been remaining stable but  had experienced a 10-15 minute episode of chest pain during the evening which responded to nitroglycerin. When I saw her the following day she had been taking isosorbide 90 mg in the morning and I added isosorbide 30 mg at bedtime to her medical regimen of Ranexa 1000 mg twice a day , amlodipine 10 mg daily, and bisoprolol 5 mg. She denies recurrent chest pain symptomatology since the nocturnal dose of Imdur was added.  She developed a rash and  was advised to stop both Brilinta and Ranexa.  She was started on Plavix. Her rash ultimately resolved.    Her husband passed away in on Jan 13, 2016.  She has been more active.  She denies nocturnal symptoms.  She was evaluated  in the emergency room in January 2017 with right shoulder discomfort.    She underwent low back surgery in August 2018 by Dr. Louanne Skye. She tolerated surgery well.  A preoperative nuclear study showed an EF of 70% with probable apical soft tissue attenuation.  There was no ischemia.  Last saw her in November 2018 and she was doing well without chest pain or shortness of breath.  Although she has a history of mild asthma.   She was diagnosed with GERD.  She  underwent successful cataract surgery right eye in February and left eye in April 2019 by Dr. Satira Sark.  Since I last saw her in June 2019 she was seen by Kerin Ransom on September 26, 2018 with a history suggestive of recurrent unstable angina.  She was hospitalized and on September 27, 2018 repeat cardiac catheterization by me was performed.  This revealed severe coronary calcification involving the LAD circumflex and RCA.  There was smooth 20 to 25% distal left main narrowing.  The LAD had 40% proximal calcified stenosis.  The LAD was severely calcified with 95% stenosis in the region of the first diagonal and there was diffuse disease in the midsegment of 50% followed by 90% mid stenosis and 80% distal stenosis.  The circumflex was calcified without high-grade obstructive disease.  The RCA was calcified and was totally occluded just prior to the acute margin.  There was extensive left-to-right collateralization to the distal RCA via the left circulation.  There was low normal LV function with an EF of 50 to 55% with a small region of focal mid mild anterolateral and posterior basal inferior hypocontractility.  Due to the severe calcification CABG revascularization surgery was recommended.  She underwent CABG surgery x2 with her LIMA to her LAD and SVG to the PDA.  However, during surgery she was instantly found to have a mediastinal lymph node which was sent for pathology and unfortunately showed metastatic adenocarcinoma.  She is now being followed by Dr. 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  PET and CT imaging of her chest abdomen and pelvis with contrast showed diffuse adenopathy from supraclavicular to the abdomen with a hypermetabolic mass in the gallbladder and large hypermetabolic bony lesion in the posterior left pelvis.  She is scheduled to have a Port-A-Cath placed on December 20 and Plavix has been held for this procedure.  She is to start chemotherapy on  December 15, 2018.  She was recently evaluated by Dr. Melvyn Novas on November 27, 2018.  Laboratory on that day showed significant proBNP elevation at 1313.  She was started on furosemide and is breathing better.  She presents now for cardiology evaluation.   Past Medical History:  Diagnosis Date  . Allergic rhinitis   . Arthritis   . Asthma    xolair s 8/05 ?11/07; mastered hfa 12/20/08  . Benign positional vertigo   . CAD (coronary artery disease)    a. 09/2014 NSTEMI s/p LHC with sig 2V dz. dLAD diffusely diseased and not suitable for PCI. unsuccessful RCA PCI d/t heavy calcifications  . GERD (gastroesophageal reflux disease)   . Heart attack ([place])    09/2014  . Hyperlipidemia    <130 ldl pos fm hx, bp  . Hypertension   . Osteopenia    dexa 08/22/07 AP spine + 1.1, left femur -1.3, right femur -.8; dexa 10/06/09 +1.6, left femur =1.6, right femur -.  . PONV (postoperative nausea and vomiting)   . Ruptured  disc, cervical   . 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 BREAST SURGERY  10-26-10   Rt. breast bx--for microcalcifications in rt. retroareolar region--dx was hyalinized fibroadenoma  . BUNIONECTOMY Bilateral   . CARDIAC CATHETERIZATION     2015  . CATARACT EXTRACTION Right 02/02/2018   Dr Satira Sark  . CATARACT EXTRACTION Left 03/30/2018  . [place] SURGERY  12/01  . CORONARY ARTERY BYPASS GRAFT N/A 10/04/2018   Procedure: CORONARY ARTERY BYPASS GRAFTING (CABG) times 2 using left  Internal mammary artery to LAD, left greater saphenous vein - open harvest.;  Surgeon: [name], MD;  Location: [place];  Service: Open Heart Surgery;  Laterality: N/A;  . IR IMAGING GUIDED PORT INSERTION  12/08/2018  . LEFT HEART CATH AND CORONARY ANGIOGRAPHY N/A 09/27/2018   Procedure: LEFT HEART CATH AND CORONARY ANGIOGRAPHY;  Surgeon: Troy Sine, MD;  Location: [place] [facility];  Service: Cardiovascular;   Laterality: N/A;  . LEFT HEART CATHETERIZATION WITH CORONARY ANGIOGRAM N/A 10/16/2014   Procedure: LEFT HEART CATHETERIZATION WITH CORONARY ANGIOGRAM;  Surgeon: Blane Ohara, MD;  Location: [facility] CATH LAB;  Service: Cardiovascular;  Laterality: N/A;  . Left L4-5 transforaminal lumbar interbody fusion with Depuy cage, rods and screws, local and allograft bone graft, Vivigen; bilateral decompression/partial hemilaminectomy lumbar five-sacral one  07/2017  . PERCUTANEOUS CORONARY ROTOBLATOR INTERVENTION (PCI-R) N/A 10/17/2014   Procedure: PERCUTANEOUS CORONARY ROTOBLATOR INTERVENTION (PCI-R);  Surgeon: Troy Sine, MD;  Location: [facility] CATH LAB;  Service: Cardiovascular;  Laterality: N/A;  . TEE WITHOUT CARDIOVERSION N/A 10/04/2018   Procedure: TRANSESOPHAGEAL ECHOCARDIOGRAM (TEE);  Surgeon: [name], MD;  Location: [place];  Service: Open Heart Surgery;  Laterality: N/A;  . VEIN LIGATION AND STRIPPING Left   . VIDEO BRONCHOSCOPY Bilateral 02/05/2015   Procedure: VIDEO BRONCHOSCOPY WITHOUT FLUORO;  Surgeon: Tanda Rockers, MD;  Location: [facility];  Service: Endoscopy;  Laterality: Bilateral;  . VULVA /PERINEUM BIOPSY  12-30-10   --epidermoid cyst    Allergies  Allergen Reactions  . Fish-Derived Products Shortness Of Breath, Swelling and Rash  . Penicillins Shortness Of Breath, Swelling and Rash    PATIENT HAS HAD A PCN REACTION WITH IMMEDIATE RASH, FACIAL/TONGUE/THROAT SWELLING, SOB, OR LIGHTHEADEDNESS WITH HYPOTENSION:  #  #  #  YES  #  #  #   Has patient had a PCN reaction causing severe rash involving mucus membranes or skin necrosis: No PATIENT HAS HAD A PCN REACTION THAT REQUIRED HOSPITALIZATION:  #  #  #  YES  #  #  #   Has patient had a PCN reaction occurring within the last 10 years: No    . Aspirin Swelling and Rash    SWELLING REACTION UNSPECIFIED   . Brilinta [Ticagrelor] Rash    Started Brilinta 09/2014 - rash - unsure which it is directly related to  . Codeine  Phosphate Nausea Only  . Diphenhydramine Hcl Rash    Current Outpatient Medications  Medication Sig Dispense Refill  . albuterol (PROAIR HFA) 108 (90 Base) MCG/ACT inhaler INHALE 1 TO 2 PUFFS BY MOUTH EVERY 4 HOURS AS NEEDED FOR WHEEZING 8.5 g 3  . atorvastatin (LIPITOR) 80 MG tablet TAKE 1 TABLET(80 MG) BY MOUTH DAILY 90 tablet 1  . bisoprolol (ZEBETA) 10 MG tablet Take 1 tablet (10 mg total) by mouth daily. 90 tablet 1  . celecoxib (CELEBREX) 200 MG capsule  Take 1 capsule (200 mg total) by mouth 2 (two) times daily. 60 capsule 1  . cholecalciferol 2000 units TABS Take 1 tablet (2,000 Units total) by mouth daily. 30 tablet 3  . clopidogrel (PLAVIX) 75 MG tablet Take 1 tablet (75 mg total) by mouth daily. 90 tablet 3  . clotrimazole-betamethasone (LOTRISONE) cream Apply 1 application topically as needed (itching). Use as directed for rash 30 g 2  . dextromethorphan-guaiFENesin (MUCINEX DM) 30-600 MG per 12 hr tablet Take 1-2 tablets by mouth every 12 (twelve) hours as needed for cough (with flutter).     Marland Kitchen lidocaine-prilocaine (EMLA) cream Apply to affected area once 30 g 3  . meclizine (ANTIVERT) 25 MG tablet TAKE 2 TABLETS BY MOUTH THREE TIMES DAILY AS NEEDED 24 tablet 2  . montelukast (SINGULAIR) 10 MG tablet TAKE 1 TABLET(10 MG) BY MOUTH AT BEDTIME 90 tablet 0  . Multiple Vitamin (MULTIVITAMIN) capsule Take 1 capsule by mouth daily.     . ondansetron (ZOFRAN) 8 MG tablet Take 1 tablet (8 mg total) by mouth 2 (two) times daily as needed. Start on the third day after chemotherapy. 30 tablet 1  . oxyCODONE (OXY IR/ROXICODONE) 5 MG immediate release tablet Take 1 tablet (5 mg total) by mouth every 6 (six) hours as needed for severe pain. 25 tablet 0  . prochlorperazine (COMPAZINE) 10 MG tablet Take 1 tablet (10 mg total) by mouth every 6 (six) hours as needed (Nausea or vomiting). 30 tablet 1  . SYMBICORT 160-4.5 MCG/ACT inhaler INHALE 2 PUFFS BY MOUTH EVERY 12 HOURS 10.2 g 11  . furosemide  (LASIX) 20 MG tablet Take 1 tablet (20 mg total) by mouth daily. 90 tablet 3   No current facility-administered medications for this visit.     Social History   Socioeconomic History  . Marital status: Widowed    Spouse name: Not on file  . Number of children: Not on file  . Years of education: Not on file  . Highest education level: Not on file  Occupational History  . Occupation: retired Tour manager  Social Needs  . Financial resource strain: Not on file  . Food insecurity:    Worry: Not on file    Inability: Not on file  . Transportation needs:    Medical: Not on file    Non-medical: Not on file  Tobacco Use  . Smoking status: Never Smoker  . Smokeless tobacco: Never Used  Substance and Sexual Activity  . Alcohol use: No    Alcohol/week: 0.0 standard drinks  . Drug use: No  . Sexual activity: Not Currently    Partners: Male    Birth control/protection: Post-menopausal  Lifestyle  . Physical activity:    Days per week: Not on file    Minutes per session: Not on file  . Stress: Not on file  Relationships  . Social connections:    Talks on phone: Not on file    Gets together: Not on file    Attends religious service: Not on file    Active member of club or organization: Not on file    Attends meetings of clubs or organizations: Not on file    Relationship status: Not on file  . Intimate partner violence:    Fear of current or ex partner: Not on file    Emotionally abused: Not on file    Physically abused: Not on file    Forced sexual activity: Not on file  Other Topics Concern  .  Not on file  Social History Narrative  . Not on file    Family History  Problem Relation Age of Onset  . Diabetes Mother        AODM  . Stroke Mother   . Hypertension Mother   . Heart disease Father   . Diabetes Sister        AODM  . Hypertension Sister   . Breast cancer Daughter 12       dec--mets to liver/spine    ROS General: Negative; No fevers, chills,  or night sweats HEENT: This post recent cataract surgery in both eyes in February and April 2019; no sinus congestion, difficulty swallowing Pulmonary: positive for asthma; No cough, wheezing, shortness of breath, hemoptysis Cardiovascular: See HPI:  GI: Negative; No nausea, vomiting, diarrhea, or abdominal pain GU: Negative; No dysuria, hematuria, or difficulty voiding Musculoskeletal: Occasional right shoulder discomfort, and low back discomfort Hematologic/Oncologic: Recently diagnosed metastatic adenocarcinoma, 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 Other comprehensive 14 point system review is negative   Physical Exam BP 110/62   Pulse 76   Ht 4' 11"  (1.499 m)   Wt 121 lb (54.9 kg)   LMP 12/21/1999   BMI 24.44 kg/m    Repeat blood pressure by me 112/66.  Wt Readings from Last 3 Encounters:  12/08/18 119 lb (54 kg)  12/06/18 121 lb (54.9 kg)  11/27/18 121 lb (54.9 kg)   General: Alert, oriented, no distress.  Skin: normal turgor, no rashes, warm and dry HEENT: Normocephalic, atraumatic. Pupils equal round and reactive to light; sclera anicteric; extraocular muscles intact; Nose without nasal septal hypertrophy Mouth/Parynx benign; Mallinpatti scale 3 Neck: No JVD, no carotid bruits; normal carotid upstroke Lungs: clear to ausculatation and percussion; no wheezing or rales Chest wall: without tenderness to palpitation Heart: PMI not displaced, RRR, s1 s2 normal, 1/6 systolic murmur, no diastolic murmur, no rubs, gallops, thrills, or heaves Abdomen: Mild right upper quadrant tenderness soft, no hepatosplenomehaly, BS+; abdominal aorta nontender and not dilated  by palpation. Back: no CVA tenderness Pulses 2+ Musculoskeletal: full range of motion, normal strength, no joint deformities Extremities: no clubbing cyanosis or edema, Homan's sign negative  Neurologic: grossly nonfocal; Cranial nerves grossly wnl Psychologic: Normal mood and affect  ECG (independently read by me): Sinus rhythm at 76 bpm.  QS complex anteroseptally.  Lateral T wave abnormality.    June 2019 ECG (independently read by me): Normal sinus rhythm 81 bpm.  Right bundle branch block with repolarization changes.  November 16, 2017 ECG (independently read by me): Normal sinus rhythm at 75 bpm with right bundle branch block.  Left axis deviation.  Q waves in 3 and aVF.  April 2018 ECG (independently read by me): Normal sinus rhythm at 74 bpm.  Right bundle-branch block with repolarization changes.  October 2017 ECG (independently read by me): Normal sinus rhythm at 68 bpm.  The right bundle branch block with repolarization changes.  April 2017 ECG (independently read by me): Normal sinus rhythm at 70 bpm.  Right bundle-branch block with repolarization changes.  November 2016 ECG (independently read by me): Normal sinus rhythm at 70 bpm.  Right bundle-branch block with repolarization changes.  May 2016 ECG (independently read by me):  Normal sinus rhythm at 65 bpm. Right bundle branch  block with repolarization changes.  March 2016 ECG (independently read by me): Normal sinus rhythm at 68 bpm.  No ectopy.  No signal been ST segment changes.  December 2015 ECG (independently read by me): Normal sinus rhythm at 77 bpm.  Normal intervals.  No significant ST changes.  November 2015 ECG (independently read by me):sinus rhythm at 88 bpm.  QTc interval 454 ms.  No significant ST-T changes.  LABS:  BMP Latest Ref Rng & Units 12/07/2018 11/27/2018 11/13/2018  Glucose 65 - 99 mg/dL 118(H) 120(H) -  BUN 8 - 27 mg/dL 8 8 9   Creatinine 0.57 - 1.00 mg/dL 0.76 0.52 0.70  BUN/Creat Ratio 12  - 28 11(L) - -  Sodium 134 - 144 mmol/L 140 138 -  Potassium 3.5 - 5.2 mmol/L 3.7 4.0 -  Chloride 96 - 106 mmol/L 97 103 -  CO2 20 - 29 mmol/L 21 25 -  Calcium 8.7 - 10.3 mg/dL 9.6 9.2 -    Hepatic Function Latest Ref Rng & Units 12/07/2018 09/26/2018 03/09/2018  Total Protein 6.0 - 8.5 g/dL 7.9 9.0(H) 8.8(H)  Albumin 3.5 - 4.8 g/dL 3.5 3.4(L) 4.4  AST 0 - 40 IU/L 33 32 20  ALT 0 - 32 IU/L 37(H) 20 15  Alk Phosphatase 39 - 117 IU/L 130(H) 93 94  Total Bilirubin 0.0 - 1.2 mg/dL 1.0 0.6 0.5  Bilirubin, Direct 0.0 - 0.3 mg/dL - - 0.2    CBC Latest Ref Rng & Units 12/08/2018 12/07/2018 11/27/2018  WBC 4.0 - 10.5 K/uL 9.6 11.9(H) 10.0  Hemoglobin 12.0 - 15.0 g/dL 10.1(L) 10.1(L) 10.7(L)  Hematocrit 36.0 - 46.0 % 34.0(L) 30.7(L) 32.9(L)  Platelets 150 - 400 K/uL 580(H) 607(H) 555.0(H)   Lab Results  Component Value Date   MCV 88.1 12/08/2018   MCV 82 12/07/2018   MCV 85.0 11/27/2018   Lab Results  Component Value Date   TSH 1.880 12/07/2018   Lab Results  Component Value Date   HGBA1C 5.9 (H) 10/04/2018    Lipid Panel     Component Value Date/Time   CHOL 145 12/07/2018 0859   TRIG 101 12/07/2018 0859   TRIG 85 10/05/2006 1022   HDL 47 12/07/2018 0859   CHOLHDL 3.1 12/07/2018 0859   CHOLHDL 3 03/09/2018 0909   VLDL 21.8 03/09/2018 0909   LDLCALC 78 12/07/2018 0859   RADIOLOGY: Dg Chest 2 View  Result Date: 11/27/2018 CLINICAL DATA:  Shortness of breath. Known underlying carcinoma of unknown primary EXAM: CHEST - 2 VIEW COMPARISON:  Chest radiograph October 31, 2018 and chest CT November 17, 2018 FINDINGS: There Is consolidation involving a portion of the right middle lobe, better seen on lateral view. Lungs elsewhere are clear. The heart is upper normal in size with pulmonary vascularity normal. Patient is status post coronary artery bypass grafting. There is slight anterior wedging of a midthoracic vertebral body, stable. Postoperative change noted in lower cervical  spine. No blastic or lytic bone lesions are evident. No adenopathy is apparent radiography. IMPRESSION: Consolidation involving a portion of the right middle lobe persists. An obstructing endobronchial lesion must be of concern given persistence of this finding. This finding may warrant consideration for possible bronchoscopy. Lungs elsewhere clear. Stable cardiac silhouette. Status post coronary artery bypass grafting. No adenopathy apparent by radiography. Electronically Signed   By: Lowella Grip III M.D.   On: 11/27/2018 13:04   Ct Chest W Contrast  Result Date: 11/17/2018 CLINICAL DATA:  77 year old female with history of enlarged  lymph nodes in the chest and axilla. EXAM: CT CHEST, ABDOMEN, AND PELVIS WITH CONTRAST TECHNIQUE: Multidetector CT imaging of the chest, abdomen and pelvis was performed following the standard protocol during bolus administration of intravenous contrast. CONTRAST:  112m OMNIPAQUE IOHEXOL 300 MG/ML  SOLN COMPARISON:  PET-CT 11/13/2018. FINDINGS: CT CHEST FINDINGS Cardiovascular: Heart size is borderline enlarged. There is no significant pericardial fluid, thickening or pericardial calcification. There is aortic atherosclerosis, as well as atherosclerosis of the great vessels of the mediastinum and the coronary arteries, including calcified atherosclerotic plaque in the left main, left anterior descending, left circumflex and right coronary arteries. Status post median sternotomy for CABG including LIMA to the LAD. Calcifications of the aortic valve. Mediastinum/Nodes: Mildly enlarged posterior mediastinal lymph node adjacent to the descending thoracic aorta on the left side (axial image 44 of series 2) measuring 15 mm in short axis. Additional nonenlarged right para-aortic lymph node (axial image 25 of series 2), corresponding to a focus of hypermetabolism on the recent PET-CT. Mildly enlarged left supraclavicular lymph node measuring 12 mm in short axis (axial image 6 of  series 2) corresponding to an additional focus of hypermetabolism on the prior PET-CT. Esophagus is unremarkable in appearance. No axillary lymphadenopathy. Lungs/Pleura: Complete atelectasis of the right middle lobe. Mild diffuse bronchial wall thickening with patchy areas of thickening of the peribronchovascular interstitium and some regional architectural distortion, most pronounced in the right lower lobe, most compatible with areas of mild post infectious or inflammatory scarring. No acute consolidative airspace disease. No pleural effusions. No suspicious appearing pulmonary nodules or masses are noted. Musculoskeletal: Median sternotomy wires. Chronic appearing compression fracture of superior endplate of T7 with 192-42%loss of anterior vertebral body height. There are no aggressive appearing lytic or blastic lesions noted in the visualized portions of the skeleton. CT ABDOMEN PELVIS FINDINGS Hepatobiliary: No suspicious cystic or solid hepatic lesions. Gallbladder is grossly abnormal, with a large avidly enhancing soft tissue mass which measures approximately 5.2 x 3.5 x 3.6 cm (axial image 68 of series 2 and coronal image 35 of series 5), highly concerning for primary neoplasm of the gallbladder. Pancreas: No definite pancreatic mass. No pancreatic ductal dilatation. No pancreatic or peripancreatic fluid or inflammatory changes. Spleen: Unremarkable. Adrenals/Urinary Tract: Subcentimeter low-attenuation lesions in the left kidney are too small to definitively characterize, but are statistically likely to represent tiny cysts. Right kidney and bilateral adrenal glands are normal in appearance. No hydroureteronephrosis. Urinary bladder is normal in appearance. Stomach/Bowel: Normal appearance of the stomach. No pathologic dilatation of small bowel or colon. Normal appendix. Vascular/Lymphatic: Aortic atherosclerosis, without evidence of aneurysm or dissection in the abdominal or pelvic vasculature. Filling  defect in the left gonadal vein (axial image 84 of series 2), compatible with nonocclusive gonadal vein thrombosis. Heterogeneously enhancing upper abdominal lymph nodes, largest of which include a hepatoduodenal ligament lymph node measuring 4.6 x 3.4 x 2.8 cm (axial image 60 of series 2 and coronal image 50 of series 5), and a gastrohepatic ligament lymph node 4.6 x 3.2 x 2.1 cm (axial image 56 of series 2 and coronal image 48 of series 5). Bulky retroperitoneal lymphadenopathy is also noted, with the largest of these retroperitoneal lymph nodes measuring up to 22 mm in short axis in the high left para-aortic nodal station (axial image 67 of series 2). No definite pelvic lymphadenopathy. Reproductive: Uterus and ovaries are unremarkable in appearance. Other: No significant volume of ascites.  No pneumoperitoneum. Musculoskeletal: Large lytic lesion with adjacent enhancing soft  tissue component in the left ischium (axial image 107 of series 2 and coronal image 80 of series 5). Status post PLIF at L4-L5 with interbody cages at L4-L5 interspace. IMPRESSION: 1. 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 This is associated with extensive upper abdominal and retroperitoneal lymphadenopathy, as well as metastatic lymphadenopathy in the posterior mediastinum and left supraclavicular region. There is also a metastatic lesion to the left ischium. 2. 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 3. Aortic atherosclerosis, in addition to left main and 3 vessel coronary artery disease. Status post median sternotomy for CABG including LIMA to the LAD. 4. There are calcifications of the aortic valve. Echocardiographic correlation for evaluation of potential valvular dysfunction may be warranted if clinically indicated. Electronically Signed   By: Vinnie Langton M.D.   On: 11/17/2018 16:20   Ct Abdomen Pelvis W Contrast  Result Date: 11/17/2018 CLINICAL DATA:   77 year old female with history of enlarged lymph nodes in the chest and axilla. EXAM: CT CHEST, ABDOMEN, AND PELVIS WITH CONTRAST TECHNIQUE: Multidetector CT imaging of the chest, abdomen and pelvis was performed following the standard protocol during bolus administration of intravenous contrast. CONTRAST:  180m OMNIPAQUE IOHEXOL 300 MG/ML  SOLN COMPARISON:  PET-CT 11/13/2018. FINDINGS: CT CHEST FINDINGS Cardiovascular: Heart size is borderline enlarged. There is no significant pericardial fluid, thickening or pericardial calcification. There is aortic atherosclerosis, as well as atherosclerosis of the great vessels of the mediastinum and the coronary arteries, including calcified atherosclerotic plaque in the left main, left anterior descending, left circumflex and right coronary arteries. Status post median sternotomy for CABG including LIMA to the LAD. Calcifications of the aortic valve. Mediastinum/Nodes: Mildly enlarged posterior mediastinal lymph node adjacent to the descending thoracic aorta on the left side (axial image 44 of series 2) measuring 15 mm in short axis. Additional nonenlarged right para-aortic lymph node (axial image 25 of series 2), corresponding to a focus of hypermetabolism on the recent PET-CT. Mildly enlarged left supraclavicular lymph node measuring 12 mm in short axis (axial image 6 of series 2) corresponding to an additional focus of hypermetabolism on the prior PET-CT. Esophagus is unremarkable in appearance. No axillary lymphadenopathy. Lungs/Pleura: Complete atelectasis of the right middle lobe. Mild diffuse bronchial wall thickening with patchy areas of thickening of the peribronchovascular interstitium and some regional architectural distortion, most pronounced in the right lower lobe, most compatible with areas of mild post infectious or inflammatory scarring. No acute consolidative airspace disease. No pleural effusions. No suspicious appearing pulmonary nodules or masses are  noted. Musculoskeletal: Median sternotomy wires. Chronic appearing compression fracture of superior endplate of T7 with 160-73%loss of anterior vertebral body height. There are no aggressive appearing lytic or blastic lesions noted in the visualized portions of the skeleton. CT ABDOMEN PELVIS FINDINGS Hepatobiliary: No suspicious cystic or solid hepatic lesions. Gallbladder is grossly abnormal, with a large avidly enhancing soft tissue mass which measures approximately 5.2 x 3.5 x 3.6 cm (axial image 68 of series 2 and coronal image 35 of series 5), highly concerning for primary neoplasm of the gallbladder. Pancreas: No definite pancreatic mass. No pancreatic ductal dilatation. No pancreatic or peripancreatic fluid or inflammatory changes. Spleen: Unremarkable. Adrenals/Urinary Tract: Subcentimeter low-attenuation lesions in the left kidney are too small to definitively characterize, but are statistically likely to represent tiny cysts. Right kidney and bilateral adrenal glands are normal in appearance. No hydroureteronephrosis. Urinary bladder is normal in appearance. Stomach/Bowel: Normal appearance of the stomach. No pathologic  dilatation of small bowel or colon. Normal appendix. Vascular/Lymphatic: Aortic atherosclerosis, without evidence of aneurysm or dissection in the abdominal or pelvic vasculature. Filling defect in the left gonadal vein (axial image 84 of series 2), compatible with nonocclusive gonadal vein thrombosis. Heterogeneously enhancing upper abdominal lymph nodes, largest of which include a hepatoduodenal ligament lymph node measuring 4.6 x 3.4 x 2.8 cm (axial image 60 of series 2 and coronal image 50 of series 5), and a gastrohepatic ligament lymph node 4.6 x 3.2 x 2.1 cm (axial image 56 of series 2 and coronal image 48 of series 5). Bulky retroperitoneal lymphadenopathy is also noted, with the largest of these retroperitoneal lymph nodes measuring up to 22 mm in short axis in the high left  para-aortic nodal station (axial image 67 of series 2). No definite pelvic lymphadenopathy. Reproductive: Uterus and ovaries are unremarkable in appearance. Other: No significant volume of ascites.  No pneumoperitoneum. Musculoskeletal: Large lytic lesion with adjacent enhancing soft tissue component in the left ischium (axial image 107 of series 2 and coronal image 80 of series 5). Status post PLIF at L4-L5 with interbody cages at L4-L5 interspace. IMPRESSION: 1. 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 This is associated with extensive upper abdominal and retroperitoneal lymphadenopathy, as well as metastatic lymphadenopathy in the posterior mediastinum and left supraclavicular region. There is also a metastatic lesion to the left ischium. 2. 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 3. Aortic atherosclerosis, in addition to left main and 3 vessel coronary artery disease. Status post median sternotomy for CABG including LIMA to the LAD. 4. There are calcifications of the aortic valve. Echocardiographic correlation for evaluation of potential valvular dysfunction may be warranted if clinically indicated. Electronically Signed   By: Vinnie Langton M.D.   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 EXAM: NUCLEAR MEDICINE PET SKULL BASE TO THIGH TECHNIQUE: 5.8 mCi F-18 FDG was injected intravenously. Full-ring PET imaging was performed from the skull base to thigh after the radiotracer. CT data was obtained and used for attenuation correction and anatomic localization. Fasting blood glucose: 105 mg/dl COMPARISON:  None. FINDINGS: Mediastinal blood pool activity: SUV max 1.7 NECK: Trace asymmetric increased uptake noted left oropharynx with SUV max = 3.6 Incidental CT findings: none CHEST: 12 mm short axis left  supraclavicular lymph node demonstrates SUV max = 8.4. Hypermetabolic mediastinal lymphadenopathy evident with 11 mm short axis retroesophageal lymph node demonstrating SUV max = 7.9. Uptake in the inferior right hilum suggest lymphadenopathy. And a 11 mm short axis para-aortic node (92/4) demonstrates SUV max = 8.1. Uptake is identified in the region of the median sternotomy, potentially related to prior surgery. Incidental CT findings: Atherosclerotic calcification is noted in the wall of the thoracic aorta. Coronary artery calcification is evident. Pericardial calcification evident the along the left ventricle. Atelectasis noted along the right major fissure. No pleural effusion. ABDOMEN/PELVIS: 3.4 x 4.2 cm lesion along the fundal wall of the gallbladder is markedly hypermetabolic with SUV max = 45.6. Bulky upper abdominal lymphadenopathy is hypermetabolic. 20 mm short axis left para-aortic lymph node demonstrates SUV max = 14.1. Lymphadenopathy is identified in the hepato duodenal ligament which blends imperceptibly with the head of pancreas on this noncontrast study. Hypermetabolism in this region demonstrates SUV max = 13.6. Incidental CT findings: There is abdominal aortic atherosclerosis without aneurysm. Tiny  layering stones are seen in the gallbladder lumen. SKELETON: 3.9 x 4.2 cm destructive mass posterior left acetabulum and ischial tuberosity demonstrates SUV max = 20.6. Incidental CT findings: None. IMPRESSION: 1. Irregular soft tissue lesion involving the wall of the gallbladder fundus and inferior liver is markedly hypermetabolic. Primary gallbladder neoplasm is suspected although metastatic liver lesion with gallbladder wall involvement cannot be excluded on this study. 2. Bulky upper abdominal lymphadenopathy with confluent soft tissue attenuation in the hepato duodenal ligament involving the head of pancreas. This may all reflect lymphadenopathy although pancreatic head mass cannot be excluded on  the noncontrast CT images obtained for attenuation correction. Contrast infused CT scan of the abdomen and pelvis is recommended to further evaluate as primary pancreatic adenocarcinoma cannot be excluded. If the patient is able to reproducibly breath hold, MRI of the abdomen without and with contrast could be considered. 3. Hypermetabolic lymphadenopathy in the left supraclavicular region, mediastinum, right hilum, and abdomen, compatible with metastatic disease. 4. Large hypermetabolic bony lesion posterior left pelvis. 5. Tiny asymmetric focus of increased FDG uptake in the left oropharynx, indeterminate. 6. Cholelithiasis. 7.  Aortic Atherosclerois (ICD10-170.0) Electronically Signed   By: Misty Stanley M.D.   On: 11/13/2018 13:12   Ir Imaging Guided Port Insertion  Result Date: 12/08/2018 CLINICAL DATA:  Gallbladder carcinoma, access for chemotherapy EXAM: RIGHT INTERNAL JUGULAR SINGLE LUMEN POWER PORT CATHETER INSERTION Date:  12/08/2018 12/08/2018 3:18 pm Radiologist:  Jerilynn Mages. Daryll Brod, MD Guidance:  Ultrasound and fluoroscopic MEDICATIONS: 1 g vancomycin; The antibiotic was administered within an appropriate time interval prior to skin puncture. ANESTHESIA/SEDATION: Versed 2.0 mg IV; Fentanyl 100 mcg IV; Moderate Sedation Time:  32 minutes The patient was continuously monitored during the procedure by the interventional radiology nurse under my direct supervision. FLUOROSCOPY TIME:  0 minutes, 24 seconds (2 mGy) COMPLICATIONS: None immediate. CONTRAST:  None. PROCEDURE: Informed consent was obtained from the patient following explanation of the procedure, risks, benefits and alternatives. The patient understands, agrees and consents for the procedure. All questions were addressed. A time out was performed. Maximal barrier sterile technique utilized including caps, mask, sterile gowns, sterile gloves, large sterile drape, hand hygiene, and 2% chlorhexidine scrub. Under sterile conditions and local  anesthesia, right internal jugular micropuncture venous access was performed. Access was performed with ultrasound. Images were obtained for documentation of the patent right internal jugular vein. A guide wire was inserted followed by a transitional dilator. This allowed insertion of a guide wire and catheter into the IVC. Measurements were obtained from the SVC / RA junction back to the right IJ venotomy site. In the right infraclavicular chest, a subcutaneous pocket was created over the second anterior rib. This was done under sterile conditions and local anesthesia. 1% lidocaine with epinephrine was utilized for this. A 2.5 cm incision was made in the skin. Blunt dissection was performed to create a subcutaneous pocket over the right pectoralis major muscle. The pocket was flushed with saline vigorously. There was adequate hemostasis. The port catheter was assembled and checked for leakage. The port catheter was secured in the pocket with two retention sutures. The tubing was tunneled subcutaneously to the right venotomy site and inserted into the SVC/RA junction through a valved peel-away sheath. Position was confirmed with fluoroscopy. Images were obtained for documentation. The patient tolerated the procedure well. No immediate complications. Incisions were closed in a two layer fashion with 4 - 0 Vicryl suture. Dermabond was applied to the skin. The port catheter was accessed, blood  was aspirated followed by saline and heparin flushes. Needle was removed. A dry sterile dressing was applied. IMPRESSION: Ultrasound and fluoroscopically guided right internal jugular single lumen power port catheter insertion. Tip in the SVC/RA junction. Catheter ready for use. Electronically Signed   By: Jerilynn Mages.  Shick M.D.   On: 12/08/2018 15:23    IMPRESSION:  1. Coronary artery disease due to lipid rich plaque   2. S/P CABG (coronary artery bypass graft)   3. Hyperlipidemia with target LDL less than 70   4. Essential  hypertension   5. DOE (dyspnea on exertion)   6. Encounter for chemotherapy management   7. Mild intermittent asthma without complication     ASSESSMENT AND PLAN: Cassie Campbell is a 77 year old African-American female who suffered a NSTEMI in October 2015 which was felt to be due to subtotal high-grade calcified RCA with severe diffuse distal disease beyond her subtotal stenosis.  She also has a severely diffusely diseased mid distal LAD which is not amenable for revascularization.  At the time of her intervention, the smallest balloon catheter that was available in the catheterization laboratory was a 1.20 mm and this was unable to cross the stenosis.  She has been on medical therapy but in October developed recurrent angina consistent with unstable symptoms necessitating repeat catheterization.  She had severely calcified CAD as noted above and CABG revascularization surgery was recommended.  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  She is to initiate chemotherapy next week in anticipation of her planned Port-A-Cath later this week her Plavix has been on hold.  Presently she is not having any anginal symptomatology.  Her blood pressure is controlled on bisoprolol 10 mg in addition to her recently re-started furosemide.  Presently there is no wheezing on Singulair, Symbicort, and she has albuterol to take as needed.  She had seen Dr. Melvyn Novas last week and laboratory was consistent with volume overload with an elevated proBNP 1313.  She has diuresed with initiation of furosemide and is breathing well.  I have recommended she continue furosemide and have renewed her prescription.  I will recheck laboratory consisting of a CMET, CBC, fasting lipid panel, TSH, and BNP.  I am scheduling her for an echo Doppler study following  revascularization to reassess systolic and diastolic function and prior to initiating chemotherapy with cisplatin and gemcitabine.  She continues to be on atorvastatin for hyperlipidemia with target LDL less than 70.  I will see her in 3 months for reevaluation.   Time Spent: 30 minutes Troy Sine, MD, [facility]  12/11/2018 11:05 AM

## 2018-12-07 ENCOUNTER — Other Ambulatory Visit: Payer: Self-pay | Admitting: Student

## 2018-12-08 ENCOUNTER — Ambulatory Visit (HOSPITAL_COMMUNITY)
Admission: RE | Admit: 2018-12-08 | Discharge: 2018-12-08 | Disposition: A | Discharge status: Home / Self Care | Payer: Medicare Other | Source: Ambulatory Visit | Attending: Hematology | Admitting: Hematology

## 2018-12-08 ENCOUNTER — Other Ambulatory Visit: Payer: Self-pay

## 2018-12-08 ENCOUNTER — Other Ambulatory Visit: Payer: Self-pay | Admitting: Hematology

## 2018-12-08 ENCOUNTER — Encounter (HOSPITAL_COMMUNITY): Payer: Self-pay

## 2018-12-08 DIAGNOSIS — Z955 Presence of coronary angioplasty implant and graft: Secondary | ICD-10-CM | POA: Insufficient documentation

## 2018-12-08 DIAGNOSIS — Z823 Family history of stroke: Secondary | ICD-10-CM | POA: Insufficient documentation

## 2018-12-08 DIAGNOSIS — K219 Gastro-esophageal reflux disease without esophagitis: Secondary | ICD-10-CM | POA: Diagnosis not present

## 2018-12-08 DIAGNOSIS — Z951 Presence of aortocoronary bypass graft: Secondary | ICD-10-CM | POA: Insufficient documentation

## 2018-12-08 DIAGNOSIS — M8580 Other specified disorders of bone density and structure, unspecified site: Secondary | ICD-10-CM | POA: Diagnosis not present

## 2018-12-08 DIAGNOSIS — Z7951 Long term (current) use of inhaled steroids: Secondary | ICD-10-CM | POA: Diagnosis not present

## 2018-12-08 DIAGNOSIS — Z8249 Family history of ischemic heart disease and other diseases of the circulatory system: Secondary | ICD-10-CM | POA: Diagnosis not present

## 2018-12-08 DIAGNOSIS — Z79899 Other long term (current) drug therapy: Secondary | ICD-10-CM | POA: Insufficient documentation

## 2018-12-08 DIAGNOSIS — E785 Hyperlipidemia, unspecified: Secondary | ICD-10-CM | POA: Diagnosis not present

## 2018-12-08 DIAGNOSIS — Z803 Family history of malignant neoplasm of breast: Secondary | ICD-10-CM | POA: Diagnosis not present

## 2018-12-08 DIAGNOSIS — C23 Malignant neoplasm of gallbladder: Secondary | ICD-10-CM | POA: Diagnosis not present

## 2018-12-08 DIAGNOSIS — Z885 Allergy status to narcotic agent status: Secondary | ICD-10-CM | POA: Diagnosis not present

## 2018-12-08 DIAGNOSIS — M1990 Unspecified osteoarthritis, unspecified site: Secondary | ICD-10-CM | POA: Insufficient documentation

## 2018-12-08 DIAGNOSIS — Z886 Allergy status to analgesic agent status: Secondary | ICD-10-CM | POA: Insufficient documentation

## 2018-12-08 DIAGNOSIS — Z880 Allergy status to penicillin: Secondary | ICD-10-CM | POA: Diagnosis not present

## 2018-12-08 DIAGNOSIS — Z888 Allergy status to other drugs, medicaments and biological substances status: Secondary | ICD-10-CM | POA: Diagnosis not present

## 2018-12-08 DIAGNOSIS — I2510 Atherosclerotic heart disease of native coronary artery without angina pectoris: Secondary | ICD-10-CM | POA: Diagnosis not present

## 2018-12-08 DIAGNOSIS — I10 Essential (primary) hypertension: Secondary | ICD-10-CM | POA: Diagnosis not present

## 2018-12-08 DIAGNOSIS — Z808 Family history of malignant neoplasm of other organs or systems: Secondary | ICD-10-CM | POA: Diagnosis not present

## 2018-12-08 HISTORY — PX: IR IMAGING GUIDED PORT INSERTION: IMG5740

## 2018-12-08 LAB — COMPREHENSIVE METABOLIC PANEL
A/G RATIO: 0.8 — AB (ref 1.2–2.2)
ALT: 37 IU/L — ABNORMAL HIGH (ref 0–32)
AST: 33 IU/L (ref 0–40)
Albumin: 3.5 g/dL (ref 3.5–4.8)
Alkaline Phosphatase: 130 IU/L — ABNORMAL HIGH (ref 39–117)
BUN/Creatinine Ratio: 11 — ABNORMAL LOW (ref 12–28)
BUN: 8 mg/dL (ref 8–27)
Bilirubin Total: 1 mg/dL (ref 0.0–1.2)
CO2: 21 mmol/L (ref 20–29)
Calcium: 9.6 mg/dL (ref 8.7–10.3)
Chloride: 97 mmol/L (ref 96–106)
Creatinine, Ser: 0.76 mg/dL (ref 0.57–1.00)
GFR calc Af Amer: 88 mL/min/{1.73_m2} (ref >59)
GFR, EST NON AFRICAN AMERICAN: 76 mL/min/{1.73_m2} (ref >59)
Globulin, Total: 4.4 g/dL (ref 1.5–4.5)
Glucose: 118 mg/dL — ABNORMAL HIGH (ref 65–99)
POTASSIUM: 3.7 mmol/L (ref 3.5–5.2)
Sodium: 140 mmol/L (ref 134–144)
Total Protein: 7.9 g/dL (ref 6.0–8.5)

## 2018-12-08 LAB — CBC
HCT: 34 % — ABNORMAL LOW (ref 36.0–46.0)
Hematocrit: 30.7 % — ABNORMAL LOW (ref 34.0–46.6)
Hemoglobin: 10.1 g/dL — ABNORMAL LOW (ref 11.1–15.9)
Hemoglobin: 10.1 g/dL — ABNORMAL LOW (ref 12.0–15.0)
MCH: 27 pg (ref 26.6–33.0)
MCH: 26.2 pg (ref 26.0–34.0)
MCHC: 32.9 g/dL (ref 31.5–35.7)
MCHC: 29.7 g/dL — ABNORMAL LOW (ref 30.0–36.0)
MCV: 82 fL (ref 79–97)
MCV: 88.1 fL (ref 80.0–100.0)
Platelets: 607 10*3/uL — ABNORMAL HIGH (ref 150–450)
Platelets: 580 10*3/uL — ABNORMAL HIGH (ref 150–400)
RBC: 3.74 x10E6/uL — ABNORMAL LOW (ref 3.77–5.28)
RBC: 3.86 MIL/uL — AB (ref 3.87–5.11)
RDW: 14.5 % (ref 12.3–15.4)
RDW: 16 % — ABNORMAL HIGH (ref 11.5–15.5)
WBC: 11.9 10*3/uL — ABNORMAL HIGH (ref 3.4–10.8)
WBC: 9.6 10*3/uL (ref 4.0–10.5)
nRBC: 0 % (ref 0.0–0.2)

## 2018-12-08 LAB — PROTIME-INR
INR: 1.07
Prothrombin Time: 13.8 seconds (ref 11.4–15.2)

## 2018-12-08 LAB — TSH: TSH: 1.88 u[IU]/mL (ref 0.450–4.500)

## 2018-12-08 LAB — LIPID PANEL
Chol/HDL Ratio: 3.1 ratio (ref 0.0–4.4)
Cholesterol, Total: 145 mg/dL (ref 100–199)
HDL: 47 mg/dL (ref >39)
LDL Calculated: 78 mg/dL (ref 0–99)
Triglycerides: 101 mg/dL (ref 0–149)
VLDL CHOLESTEROL CAL: 20 mg/dL (ref 5–40)

## 2018-12-08 LAB — BRAIN NATRIURETIC PEPTIDE: BNP: 1223.7 pg/mL — ABNORMAL HIGH (ref 0.0–100.0)

## 2018-12-08 LAB — APTT: APTT: 27 s (ref 24–36)

## 2018-12-08 MED ORDER — LIDOCAINE-EPINEPHRINE (PF) 2 %-1:200000 IJ SOLN
INTRAMUSCULAR | Status: AC | PRN
  Administered 2018-12-08: 10 mL

## 2018-12-08 MED ORDER — LIDOCAINE-EPINEPHRINE (PF) 2 %-1:200000 IJ SOLN
INTRAMUSCULAR | Status: AC | PRN
  Administered 2018-12-08: 5 mL

## 2018-12-08 MED ORDER — MIDAZOLAM HCL 2 MG/2ML IJ SOLN
INTRAMUSCULAR | Status: AC | PRN
  Administered 2018-12-08 (×2): 1 mg via INTRAVENOUS

## 2018-12-08 MED ORDER — HEPARIN SOD (PORK) LOCK FLUSH 100 UNIT/ML IV SOLN
INTRAVENOUS | Status: AC
  Filled 2018-12-08: qty 5

## 2018-12-08 MED ORDER — LIDOCAINE-EPINEPHRINE (PF) 2 %-1:200000 IJ SOLN
INTRAMUSCULAR | Status: AC
  Filled 2018-12-08: qty 20

## 2018-12-08 MED ORDER — VANCOMYCIN HCL IN DEXTROSE 1-5 GM/200ML-% IV SOLN
1000.0000 mg | Freq: Once | INTRAVENOUS | Status: AC
  Administered 2018-12-08: 1000 mg via INTRAVENOUS

## 2018-12-08 MED ORDER — VANCOMYCIN HCL IN DEXTROSE 1-5 GM/200ML-% IV SOLN
INTRAVENOUS | Status: AC
  Administered 2018-12-08: 1000 mg via INTRAVENOUS
  Filled 2018-12-08: qty 200

## 2018-12-08 MED ORDER — HEPARIN SOD (PORK) LOCK FLUSH 100 UNIT/ML IV SOLN
INTRAVENOUS | Status: AC | PRN
  Administered 2018-12-08: 500 [IU] via INTRAVENOUS

## 2018-12-08 MED ORDER — SODIUM CHLORIDE 0.9 % IV SOLN
INTRAVENOUS | Status: DC
  Administered 2018-12-08: 14:00:00 via INTRAVENOUS

## 2018-12-08 MED ORDER — FENTANYL CITRATE (PF) 100 MCG/2ML IJ SOLN
INTRAMUSCULAR | Status: AC
  Filled 2018-12-08: qty 2

## 2018-12-08 MED ORDER — FENTANYL CITRATE (PF) 100 MCG/2ML IJ SOLN
INTRAMUSCULAR | Status: AC | PRN
  Administered 2018-12-08 (×2): 50 ug via INTRAVENOUS

## 2018-12-08 MED ORDER — MIDAZOLAM HCL 2 MG/2ML IJ SOLN
INTRAMUSCULAR | Status: AC
  Filled 2018-12-08: qty 4

## 2018-12-08 NOTE — H&P (Signed)
Chief Complaint: Patient was seen in consultation today for port placement  Referring Physician(s): Feng,Yan  Supervising Physician: Daryll Brod  Patient Status: [facility] - Out-pt  History of Present Illness: Cassie Campbell is a 77 y.o. 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 Burr Medico who requests port placement today in IR for initiation of palliative chemotherapy.  Patient states she feels ok overall, is having some more fatigue and dyspnea with exertion after undergoing bypass 2 months ago. She follows up regularly with her cardiologist and pulmonologist. She also has had a poor appetite and estimates that she has lost 10-12 lbs in the past month or two due to just not being hungry. She states understanding of requested procedure and wishes to proceed.   Past Medical History:  Diagnosis Date  . Allergic rhinitis   . Arthritis   . Asthma    xolair s 8/05 ?11/07; mastered hfa 12/20/08  . Benign positional vertigo   . CAD (coronary artery disease)    a. 09/2014 NSTEMI s/p LHC with sig 2V dz. dLAD diffusely diseased and not suitable for PCI. unsuccessful RCA PCI d/t heavy calcifications  . GERD (gastroesophageal reflux disease)   . Heart attack ([place])    09/2014  . Hyperlipidemia    <130 ldl pos fm hx, bp  . Hypertension   . Osteopenia    dexa 08/22/07 AP spine + 1.1, left femur -1.3, right femur -.8; dexa 10/06/09 +1.6, left femur =1.6, right femur -.  . PONV (postoperative nausea and vomiting)   . Ruptured disc, cervical   . 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 BREAST SURGERY  10-26-10   Rt. breast bx--for microcalcifications in rt. retroareolar region--dx was hyalinized fibroadenoma  . BUNIONECTOMY Bilateral   . CARDIAC CATHETERIZATION      2015  . CATARACT EXTRACTION Right 02/02/2018   Dr Satira Sark  . CATARACT EXTRACTION Left 03/30/2018  . [facility]ERY  12/01  . CORONARY ARTERY BYPASS GRAFT N/A 10/04/2018   Procedure: CORONARY ARTERY BYPASS GRAFTING (CABG) times 2 using left  Internal mammary artery to LAD, left greater saphenous vein - open harvest.;  Surgeon: [name], MD;  Location: [facility];  Service: Open Heart Surgery;  Laterality: N/A;  . LEFT HEART CATH AND CORONARY ANGIOGRAPHY N/A 09/27/2018   Procedure: LEFT HEART CATH AND CORONARY ANGIOGRAPHY;  Surgeon: Troy Sine, MD;  Location: [place] CV LAB;  Service: Cardiovascular;  Laterality: N/A;  . LEFT HEART CATHETERIZATION WITH CORONARY ANGIOGRAM N/A 10/16/2014   Procedure: LEFT HEART CATHETERIZATION WITH CORONARY ANGIOGRAM;  Surgeon: Blane Ohara, MD;  Location: [facility] CATH LAB;  Service: Cardiovascular;  Laterality: N/A;  . Left L4-5 transforaminal lumbar interbody fusion with Depuy cage, rods and screws, local and allograft bone graft, Vivigen; bilateral decompression/partial hemilaminectomy lumbar five-sacral one  07/2017  . PERCUTANEOUS CORONARY ROTOBLATOR INTERVENTION (PCI-R) N/A 10/17/2014   Procedure: PERCUTANEOUS CORONARY ROTOBLATOR INTERVENTION (PCI-R);  Surgeon: Troy Sine, MD;  Location: [facility] CATH LAB;  Service: Cardiovascular;  Laterality: N/A;  . TEE WITHOUT CARDIOVERSION N/A 10/04/2018   Procedure: TRANSESOPHAGEAL ECHOCARDIOGRAM (TEE);  Surgeon: [name], MD;  Location: [place];  Service: Open Heart Surgery;  Laterality: N/A;  . VEIN LIGATION AND STRIPPING Left   . VIDEO BRONCHOSCOPY  Bilateral 02/05/2015   Procedure: VIDEO BRONCHOSCOPY WITHOUT FLUORO;  Surgeon: Tanda Rockers, MD;  Location: [facility] ENDOSCOPY;  Service: Endoscopy;  Laterality: Bilateral;  . VULVA /PERINEUM BIOPSY  12-30-10   --epidermoid cyst    Allergies: Fish-derived products; Penicillins; Aspirin; Brilinta [ticagrelor]; Codeine phosphate; and  Diphenhydramine hcl  Medications: Prior to Admission medications   Medication Sig Start Date End Date Taking? Authorizing Provider  albuterol (PROAIR HFA) 108 (90 Base) MCG/ACT inhaler INHALE 1 TO 2 PUFFS BY MOUTH EVERY 4 HOURS AS NEEDED FOR WHEEZING 11/20/18  Yes Tanda Rockers, MD  atorvastatin (LIPITOR) 80 MG tablet TAKE 1 TABLET(80 MG) BY MOUTH DAILY 06/20/18  Yes Troy Sine, MD  bisoprolol (ZEBETA) 10 MG tablet Take 1 tablet (10 mg total) by mouth daily. 10/16/18  Yes Almyra Deforest, PA  cholecalciferol 2000 units TABS Take 1 tablet (2,000 Units total) by mouth daily. 08/18/17  Yes Jessy Oto, MD  clotrimazole-betamethasone (LOTRISONE) cream Apply 1 application topically as needed (itching). Use as directed for rash 11/27/18  Yes Tanda Rockers, MD  dextromethorphan-guaiFENesin [facility] DM) 30-600 MG per 12 hr tablet Take 1-2 tablets by mouth every 12 (twelve) hours as needed for cough (with flutter).    Yes [provider]  furosemide (LASIX) 20 MG tablet Take 1 tablet (20 mg total) by mouth daily. 12/06/18 03/06/19 Yes Troy Sine, MD  meclizine (ANTIVERT) 25 MG tablet TAKE 2 TABLETS BY MOUTH THREE TIMES DAILY AS NEEDED 11/27/18  Yes Tanda Rockers, MD  montelukast (SINGULAIR) 10 MG tablet TAKE 1 TABLET(10 MG) BY MOUTH AT BEDTIME 11/21/18  Yes Tanda Rockers, MD  Multiple Vitamin (MULTIVITAMIN) capsule Take 1 capsule by mouth daily.    Yes [provider]  SYMBICORT 160-4.5 MCG/ACT inhaler INHALE 2 PUFFS BY MOUTH EVERY 12 HOURS 10/27/18  Yes Tanda Rockers, MD  celecoxib (CELEBREX) 200 MG capsule Take 1 capsule (200 mg total) by mouth 2 (two) times daily. 10/31/18   [name], MD  clopidogrel (PLAVIX) 75 MG tablet Take 1 tablet (75 mg total) by mouth daily. 12/06/18   Troy Sine, MD  lidocaine-prilocaine (EMLA) cream Apply to affected area once 11/21/18   Truitt Merle, MD  ondansetron (ZOFRAN) 8 MG tablet Take 1 tablet (8 mg total) by mouth 2 (two) times  daily as needed. Start on the third day after chemotherapy. 11/21/18   Truitt Merle, MD  oxyCODONE (OXY IR/ROXICODONE) 5 MG immediate release tablet Take 1 tablet (5 mg total) by mouth every 6 (six) hours as needed for severe pain. 10/31/18   [name], MD  prochlorperazine (COMPAZINE) 10 MG tablet Take 1 tablet (10 mg total) by mouth every 6 (six) hours as needed (Nausea or vomiting). 11/21/18   Truitt Merle, MD     Family History  Problem Relation Age of Onset  . Diabetes Mother        AODM  . Stroke Mother   . Hypertension Mother   . Heart disease Father   . Diabetes Sister        AODM  . Hypertension Sister   . Breast cancer Daughter 81       dec--mets to liver/spine    Social History   Socioeconomic History  . Marital status: Widowed    Spouse name: Not on file  . Number of children: Not on file  . Years of education: Not on file  . Highest education level: Not on file  Occupational History  .  Occupation: retired Tour manager  Social Needs  . Financial resource strain: Not on file  . Food insecurity:    Worry: Not on file    Inability: Not on file  . Transportation needs:    Medical: Not on file    Non-medical: Not on file  Tobacco Use  . Smoking status: Never Smoker  . Smokeless tobacco: Never Used  Substance and Sexual Activity  . Alcohol use: No    Alcohol/week: 0.0 standard drinks  . Drug use: No  . Sexual activity: Not Currently    Partners: Male    Birth control/protection: Post-menopausal  Lifestyle  . Physical activity:    Days per week: Not on file    Minutes per session: Not on file  . Stress: Not on file  Relationships  . Social connections:    Talks on phone: Not on file    Gets together: Not on file    Attends religious service: Not on file    Active member of club or organization: Not on file    Attends meetings of clubs or organizations: Not on file    Relationship status: Not on file  Other Topics Concern  . Not on file    Social History Narrative  . Not on file     Review of Systems: A 12 point ROS discussed and pertinent positives are indicated in the HPI above.  All other systems are negative.  Review of Systems  Constitutional: Positive for appetite change and fatigue. Negative for chills and fever.  Respiratory: Positive for shortness of breath (with exertion). Negative for cough.   Cardiovascular: Negative for chest pain and leg swelling.  Gastrointestinal: Negative for abdominal pain, blood in stool, diarrhea, nausea and vomiting.  Genitourinary: Negative for dysuria and hematuria.  Skin: Negative for rash.  Neurological: Negative for dizziness, syncope and headaches.  Psychiatric/Behavioral: Negative for confusion.    Vital Signs: LMP 12/21/1999   Physical Exam Vitals signs reviewed.  Constitutional:      General: She is not in acute distress.    Appearance: Normal appearance.  HENT:     Head: Normocephalic and atraumatic.  Cardiovascular:     Rate and Rhythm: Normal rate and regular rhythm.  Pulmonary:     Effort: Pulmonary effort is normal.     Breath sounds: Normal breath sounds.  Abdominal:     General: There is no distension.     Palpations: Abdomen is soft.     Tenderness: There is no abdominal tenderness.  Skin:    General: Skin is warm and dry.  Neurological:     Mental Status: She is alert and oriented to person, place, and time.  Psychiatric:        Mood and Affect: Mood normal.        Behavior: Behavior normal.        Thought Content: Thought content normal.        Judgment: Judgment normal.      MD Evaluation Airway: WNL(full top denture) Heart: WNL Abdomen: WNL Chest/ Lungs: WNL ASA  Classification: 2 Mallampati/Airway Score: One   Imaging: Dg Chest 2 View  Result Date: 11/27/2018 CLINICAL DATA:  Shortness of breath. Known underlying carcinoma of unknown primary EXAM: CHEST - 2 VIEW COMPARISON:  Chest radiograph October 31, 2018 and chest CT November 17, 2018 FINDINGS: There Is consolidation involving a portion of the right middle lobe, better seen on lateral view. Lungs elsewhere are clear. The heart is upper normal in size  with pulmonary vascularity normal. Patient is status post coronary artery bypass grafting. There is slight anterior wedging of a midthoracic vertebral body, stable. Postoperative change noted in lower cervical spine. No blastic or lytic bone lesions are evident. No adenopathy is apparent radiography. IMPRESSION: Consolidation involving a portion of the right middle lobe persists. An obstructing endobronchial lesion must be of concern given persistence of this finding. This finding may warrant consideration for possible bronchoscopy. Lungs elsewhere clear. Stable cardiac silhouette. Status post coronary artery bypass grafting. No adenopathy apparent by radiography. Electronically Signed   By: Lowella Grip III M.D.   On: 11/27/2018 13:04   Ct Chest W Contrast  Result Date: 11/17/2018 CLINICAL DATA:  77 year old female with history of enlarged lymph nodes in the chest and axilla. EXAM: CT CHEST, ABDOMEN, AND PELVIS WITH CONTRAST TECHNIQUE: Multidetector CT imaging of the chest, abdomen and pelvis was performed following the standard protocol during bolus administration of intravenous contrast. CONTRAST:  160mL OMNIPAQUE IOHEXOL 300 MG/ML  SOLN COMPARISON:  PET-CT 11/13/2018. FINDINGS: CT CHEST FINDINGS Cardiovascular: Heart size is borderline enlarged. There is no significant pericardial fluid, thickening or pericardial calcification. There is aortic atherosclerosis, as well as atherosclerosis of the great vessels of the mediastinum and the coronary arteries, including calcified atherosclerotic plaque in the left main, left anterior descending, left circumflex and right coronary arteries. Status post median sternotomy for CABG including LIMA to the LAD. Calcifications of the aortic valve. Mediastinum/Nodes: Mildly enlarged posterior  mediastinal lymph node adjacent to the descending thoracic aorta on the left side (axial image 44 of series 2) measuring 15 mm in short axis. Additional nonenlarged right para-aortic lymph node (axial image 25 of series 2), corresponding to a focus of hypermetabolism on the recent PET-CT. Mildly enlarged left supraclavicular lymph node measuring 12 mm in short axis (axial image 6 of series 2) corresponding to an additional focus of hypermetabolism on the prior PET-CT. Esophagus is unremarkable in appearance. No axillary lymphadenopathy. Lungs/Pleura: Complete atelectasis of the right middle lobe. Mild diffuse bronchial wall thickening with patchy areas of thickening of the peribronchovascular interstitium and some regional architectural distortion, most pronounced in the right lower lobe, most compatible with areas of mild post infectious or inflammatory scarring. No acute consolidative airspace disease. No pleural effusions. No suspicious appearing pulmonary nodules or masses are noted. Musculoskeletal: Median sternotomy wires. Chronic appearing compression fracture of superior endplate of T7 with 76-72% loss of anterior vertebral body height. There are no aggressive appearing lytic or blastic lesions noted in the visualized portions of the skeleton. CT ABDOMEN PELVIS FINDINGS Hepatobiliary: No suspicious cystic or solid hepatic lesions. Gallbladder is grossly abnormal, with a large avidly enhancing soft tissue mass which measures approximately 5.2 x 3.5 x 3.6 cm (axial image 68 of series 2 and coronal image 35 of series 5), highly concerning for primary neoplasm of the gallbladder. Pancreas: No definite pancreatic mass. No pancreatic ductal dilatation. No pancreatic or peripancreatic fluid or inflammatory changes. Spleen: Unremarkable. Adrenals/Urinary Tract: Subcentimeter low-attenuation lesions in the left kidney are too small to definitively characterize, but are statistically likely to represent tiny cysts.  Right kidney and bilateral adrenal glands are normal in appearance. No hydroureteronephrosis. Urinary bladder is normal in appearance. Stomach/Bowel: Normal appearance of the stomach. No pathologic dilatation of small bowel or colon. Normal appendix. Vascular/Lymphatic: Aortic atherosclerosis, without evidence of aneurysm or dissection in the abdominal or pelvic vasculature. Filling defect in the left gonadal vein (axial image 84 of series 2), compatible with nonocclusive gonadal  vein thrombosis. Heterogeneously enhancing upper abdominal lymph nodes, largest of which include a hepatoduodenal ligament lymph node measuring 4.6 x 3.4 x 2.8 cm (axial image 60 of series 2 and coronal image 50 of series 5), and a gastrohepatic ligament lymph node 4.6 x 3.2 x 2.1 cm (axial image 56 of series 2 and coronal image 48 of series 5). Bulky retroperitoneal lymphadenopathy is also noted, with the largest of these retroperitoneal lymph nodes measuring up to 22 mm in short axis in the high left para-aortic nodal station (axial image 67 of series 2). No definite pelvic lymphadenopathy. Reproductive: Uterus and ovaries are unremarkable in appearance. Other: No significant volume of ascites.  No pneumoperitoneum. Musculoskeletal: Large lytic lesion with adjacent enhancing soft tissue component in the left ischium (axial image 107 of series 2 and coronal image 80 of series 5). Status post PLIF at L4-L5 with interbody cages at L4-L5 interspace. IMPRESSION: 1. 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 This is associated with extensive upper abdominal and retroperitoneal lymphadenopathy, as well as metastatic lymphadenopathy in the posterior mediastinum and left supraclavicular region. There is also a metastatic lesion to the left ischium. 2. 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 3. Aortic atherosclerosis, in addition to left main and 3 vessel coronary artery disease.  Status post median sternotomy for CABG including LIMA to the LAD. 4. There are calcifications of the aortic valve. Echocardiographic correlation for evaluation of potential valvular dysfunction may be warranted if clinically indicated. Electronically Signed   By: Vinnie Langton M.D.   On: 11/17/2018 16:20   Ct Abdomen Pelvis W Contrast  Result Date: 11/17/2018 CLINICAL DATA:  77 year old female with history of enlarged lymph nodes in the chest and axilla. EXAM: CT CHEST, ABDOMEN, AND PELVIS WITH CONTRAST TECHNIQUE: Multidetector CT imaging of the chest, abdomen and pelvis was performed following the standard protocol during bolus administration of intravenous contrast. CONTRAST:  141mL OMNIPAQUE IOHEXOL 300 MG/ML  SOLN COMPARISON:  PET-CT 11/13/2018. FINDINGS: CT CHEST FINDINGS Cardiovascular: Heart size is borderline enlarged. There is no significant pericardial fluid, thickening or pericardial calcification. There is aortic atherosclerosis, as well as atherosclerosis of the great vessels of the mediastinum and the coronary arteries, including calcified atherosclerotic plaque in the left main, left anterior descending, left circumflex and right coronary arteries. Status post median sternotomy for CABG including LIMA to the LAD. Calcifications of the aortic valve. Mediastinum/Nodes: Mildly enlarged posterior mediastinal lymph node adjacent to the descending thoracic aorta on the left side (axial image 44 of series 2) measuring 15 mm in short axis. Additional nonenlarged right para-aortic lymph node (axial image 25 of series 2), corresponding to a focus of hypermetabolism on the recent PET-CT. Mildly enlarged left supraclavicular lymph node measuring 12 mm in short axis (axial image 6 of series 2) corresponding to an additional focus of hypermetabolism on the prior PET-CT. Esophagus is unremarkable in appearance. No axillary lymphadenopathy. Lungs/Pleura: Complete atelectasis of the right middle lobe. Mild  diffuse bronchial wall thickening with patchy areas of thickening of the peribronchovascular interstitium and some regional architectural distortion, most pronounced in the right lower lobe, most compatible with areas of mild post infectious or inflammatory scarring. No acute consolidative airspace disease. No pleural effusions. No suspicious appearing pulmonary nodules or masses are noted. Musculoskeletal: Median sternotomy wires. Chronic appearing compression fracture of superior endplate of T7 with 85-27% loss of anterior vertebral body height. There are no aggressive appearing lytic or blastic lesions noted in the visualized  portions of the skeleton. CT ABDOMEN PELVIS FINDINGS Hepatobiliary: No suspicious cystic or solid hepatic lesions. Gallbladder is grossly abnormal, with a large avidly enhancing soft tissue mass which measures approximately 5.2 x 3.5 x 3.6 cm (axial image 68 of series 2 and coronal image 35 of series 5), highly concerning for primary neoplasm of the gallbladder. Pancreas: No definite pancreatic mass. No pancreatic ductal dilatation. No pancreatic or peripancreatic fluid or inflammatory changes. Spleen: Unremarkable. Adrenals/Urinary Tract: Subcentimeter low-attenuation lesions in the left kidney are too small to definitively characterize, but are statistically likely to represent tiny cysts. Right kidney and bilateral adrenal glands are normal in appearance. No hydroureteronephrosis. Urinary bladder is normal in appearance. Stomach/Bowel: Normal appearance of the stomach. No pathologic dilatation of small bowel or colon. Normal appendix. Vascular/Lymphatic: Aortic atherosclerosis, without evidence of aneurysm or dissection in the abdominal or pelvic vasculature. Filling defect in the left gonadal vein (axial image 84 of series 2), compatible with nonocclusive gonadal vein thrombosis. Heterogeneously enhancing upper abdominal lymph nodes, largest of which include a hepatoduodenal ligament  lymph node measuring 4.6 x 3.4 x 2.8 cm (axial image 60 of series 2 and coronal image 50 of series 5), and a gastrohepatic ligament lymph node 4.6 x 3.2 x 2.1 cm (axial image 56 of series 2 and coronal image 48 of series 5). Bulky retroperitoneal lymphadenopathy is also noted, with the largest of these retroperitoneal lymph nodes measuring up to 22 mm in short axis in the high left para-aortic nodal station (axial image 67 of series 2). No definite pelvic lymphadenopathy. Reproductive: Uterus and ovaries are unremarkable in appearance. Other: No significant volume of ascites.  No pneumoperitoneum. Musculoskeletal: Large lytic lesion with adjacent enhancing soft tissue component in the left ischium (axial image 107 of series 2 and coronal image 80 of series 5). Status post PLIF at L4-L5 with interbody cages at L4-L5 interspace. IMPRESSION: 1. 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 This is associated with extensive upper abdominal and retroperitoneal lymphadenopathy, as well as metastatic lymphadenopathy in the posterior mediastinum and left supraclavicular region. There is also a metastatic lesion to the left ischium. 2. 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 3. Aortic atherosclerosis, in addition to left main and 3 vessel coronary artery disease. Status post median sternotomy for CABG including LIMA to the LAD. 4. There are calcifications of the aortic valve. Echocardiographic correlation for evaluation of potential valvular dysfunction may be warranted if clinically indicated. Electronically Signed   By: Vinnie Langton M.D.   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 EXAM: NUCLEAR MEDICINE PET SKULL BASE TO THIGH TECHNIQUE: 5.8 mCi F-18 FDG was injected intravenously. Full-ring PET imaging was performed from  the skull base to thigh after the radiotracer. CT data was obtained and used for attenuation correction and anatomic localization. Fasting blood glucose: 105 mg/dl COMPARISON:  None. FINDINGS: Mediastinal blood pool activity: SUV max 1.7 NECK: Trace asymmetric increased uptake noted left oropharynx with SUV max = 3.6 Incidental CT findings: none CHEST: 12 mm short axis left supraclavicular lymph node demonstrates SUV max = 8.4. Hypermetabolic mediastinal lymphadenopathy evident with 11 mm short axis retroesophageal lymph node demonstrating SUV max = 7.9. Uptake in the inferior right hilum suggest lymphadenopathy. And a 11 mm short axis para-aortic node (92/4) demonstrates SUV max = 8.1. Uptake is identified in the region  of the median sternotomy, potentially related to prior surgery. Incidental CT findings: Atherosclerotic calcification is noted in the wall of the thoracic aorta. Coronary artery calcification is evident. Pericardial calcification evident the along the left ventricle. Atelectasis noted along the right major fissure. No pleural effusion. ABDOMEN/PELVIS: 3.4 x 4.2 cm lesion along the fundal wall of the gallbladder is markedly hypermetabolic with SUV max = 52.7. Bulky upper abdominal lymphadenopathy is hypermetabolic. 20 mm short axis left para-aortic lymph node demonstrates SUV max = 14.1. Lymphadenopathy is identified in the hepato duodenal ligament which blends imperceptibly with the head of pancreas on this noncontrast study. Hypermetabolism in this region demonstrates SUV max = 13.6. Incidental CT findings: There is abdominal aortic atherosclerosis without aneurysm. Tiny layering stones are seen in the gallbladder lumen. SKELETON: 3.9 x 4.2 cm destructive mass posterior left acetabulum and ischial tuberosity demonstrates SUV max = 20.6. Incidental CT findings: None. IMPRESSION: 1. Irregular soft tissue lesion involving the wall of the gallbladder fundus and inferior liver is markedly  hypermetabolic. Primary gallbladder neoplasm is suspected although metastatic liver lesion with gallbladder wall involvement cannot be excluded on this study. 2. Bulky upper abdominal lymphadenopathy with confluent soft tissue attenuation in the hepato duodenal ligament involving the head of pancreas. This may all reflect lymphadenopathy although pancreatic head mass cannot be excluded on the noncontrast CT images obtained for attenuation correction. Contrast infused CT scan of the abdomen and pelvis is recommended to further evaluate as primary pancreatic adenocarcinoma cannot be excluded. If the patient is able to reproducibly breath hold, MRI of the abdomen without and with contrast could be considered. 3. Hypermetabolic lymphadenopathy in the left supraclavicular region, mediastinum, right hilum, and abdomen, compatible with metastatic disease. 4. Large hypermetabolic bony lesion posterior left pelvis. 5. Tiny asymmetric focus of increased FDG uptake in the left oropharynx, indeterminate. 6. Cholelithiasis. 7.  Aortic Atherosclerois (ICD10-170.0) Electronically Signed   By: Misty Stanley M.D.   On: 11/13/2018 13:12    Labs:  CBC: Recent Labs    10/07/18 0425 11/27/18 0957 12/07/18 0859 12/08/18 1311  WBC 13.5* 10.0 11.9* 9.6  HGB 10.0* 10.7* 10.1* 10.1*  HCT 32.7* 32.9* 30.7* 34.0*  PLT 290 555.0* 607* 580*    COAGS: Recent Labs    09/26/18 1456 10/04/18 1415 12/08/18 1311  INR 1.06 1.41 1.07  APTT  --  28 27    BMP: Recent Labs    10/06/18 0338 10/07/18 0425 11/13/18 1608 11/27/18 0957 12/07/18 0859  NA 133* 139  --  138 140  K 3.9 3.9  --  4.0 3.7  CL 101 101  --  103 97  CO2 24 29  --  25 21  GLUCOSE 113* 123*  --  120* 118*  BUN <5* 5* 9 8 8   CALCIUM 9.0 9.2  --  9.2 9.6  CREATININE 0.53 0.64 0.70 0.52 0.76  GFRNONAA >60 >60 >60  --  76  GFRAA >60 >60 >60  --  88    LIVER FUNCTION TESTS: Recent Labs    03/09/18 0909 09/26/18 1456 12/07/18 0859  BILITOT  0.5 0.6 1.0  AST 20 32 33  ALT 15 20 37*  ALKPHOS 94 93 130*  PROT 8.8* 9.0* 7.9  ALBUMIN 4.4 3.4* 3.5    TUMOR MARKERS: No results for input(s): AFPTM, CEA, CA199, CHROMGRNA in the last 8760 hours.  Assessment and Plan:  Patient with recently diagnosed stage IV gallbladder cancer followed by Dr. Burr Medico who requests port placement today in  IR for initiation of palliative chemotherapy.   Patient has been NPO since 7 am today, last dose of Plavix 12/13, she took her bisoprolol only this morning. Afebrile, WBC 9.6, hgb 10.1, plt 580, INR 1.07.  Risks and benefits of image guided port-a-catheter placement was discussed with the patient including, but not limited to bleeding, infection, pneumothorax, or fibrin sheath development and need for additional procedures.  All of the patient's questions were answered, patient is agreeable to proceed.  Consent signed and in chart.  Thank you for this interesting consult.  I greatly enjoyed meeting Cassie Campbell and look forward to participating in their care.  A copy of this report was sent to the requesting provider on this date.  Electronically Signed: Joaquim Nam, PA-C 12/08/2018, 1:59 PM   I spent a total of  30 Minutes  in face to face in clinical consultation, greater than 50% of which was counseling/coordinating care for port placement.

## 2018-12-08 NOTE — Procedures (Signed)
Gallbladder ca  S/p RT IJ POWER PORT  TIP SVCRA NO COMP STABLE EBL MIN READY FOR USE FULL REPORT IN PACS

## 2018-12-08 NOTE — Discharge Instructions (Signed)
Do not use EMLA cream on the skin glue until it has healed. Use ice in a zip lock bag over the port to help numb it before your first treatment.     Implanted [facility] Guide An implanted port is a device that is placed under the skin. It is usually placed in the chest. The device can be used to give IV medicine, to take blood, or for dialysis. You may have an implanted port if:  You need IV medicine that would be irritating to the small veins in your hands or arms.  You need IV medicines, such as antibiotics, for a long period of time.  You need IV nutrition for a long period of time.  You need dialysis. Having a port means that your health care provider will not need to use the veins in your arms for these procedures. You may have fewer limitations when using a port than you would if you used other types of long-term IVs, and you will likely be able to return to normal activities after your incision heals. An implanted port has two main parts:  Reservoir. The reservoir is the part where a needle is inserted to give medicines or draw blood. The reservoir is round. After it is placed, it appears as a small, raised area under your skin.  Catheter. The catheter is a thin, flexible tube that connects the reservoir to a vein. Medicine that is inserted into the reservoir goes into the catheter and then into the vein. How is my port accessed? To access your port:  A numbing cream may be placed on the skin over the port site.  Your health care provider will put on a mask and sterile gloves.  The skin over your port will be cleaned carefully with a germ-killing soap and allowed to dry.  Your health care provider will gently pinch the port and insert a needle into it.  Your health care provider will check for a blood return to make sure the port is in the vein and is not clogged.  If your port needs to remain accessed to get medicine continuously (constant infusion), your health care  provider will place a clear bandage (dressing) over the needle site. The dressing and needle will need to be changed every week, or as told by your health care provider. What is flushing? Flushing helps keep the port from getting clogged. Follow instructions from your health care provider about how and when to flush the port. Ports are usually flushed with saline solution or a medicine called heparin. The need for flushing will depend on how the port is used:  If the port is only used from time to time to give medicines or draw blood, the port may need to be flushed: ? Before and after medicines have been given. ? Before and after blood has been drawn. ? As part of routine maintenance. Flushing may be recommended every 4-6 weeks.  If a constant infusion is running, the port may not need to be flushed.  Throw away any syringes in a disposal container that is meant for sharp items (sharps container). You can buy a sharps container from a pharmacy, or you can make one by using an empty hard plastic bottle with a cover. How long will my port stay implanted? The port can stay in for as long as your health care provider thinks it is needed. When it is time for the port to come out, a surgery will be done to  remove it. The surgery will be similar to the procedure that was done to put the port in. Follow these instructions at home:   Flush your port as told by your health care provider.  If you need an infusion over several days, follow instructions from your health care provider about how to take care of your port site. Make sure you: ? Wash your hands with soap and water before you change your dressing. If soap and water are not available, use alcohol-based hand sanitizer. ? Change your dressing as told by your health care provider. ? Place any used dressings or infusion bags into a plastic bag. Throw that bag in the trash. ? Keep the dressing that covers the needle clean and dry. Do not get it  wet. ? Do not use scissors or sharp objects near the tube. ? Keep the tube clamped, unless it is being used.  Check your port site every day for signs of infection. Check for: ? Redness, swelling, or pain. ? Fluid or blood. ? Pus or a bad smell.  Protect the skin around the port site. ? Avoid wearing bra straps that rub or irritate the site. ? Protect the skin around your port from seat belts. Place a soft pad over your chest if needed.  Bathe or shower as told by your health care provider. The site may get wet as long as you are not actively receiving an infusion.  Return to your normal activities as told by your health care provider. Ask your health care provider what activities are safe for you.  Carry a medical alert card or wear a medical alert bracelet at all times. This will let health care providers know that you have an implanted port in case of an emergency. Get help right away if:  You have redness, swelling, or pain at the port site.  You have fluid or blood coming from your port site.  You have pus or a bad smell coming from the port site.  You have a fever. Summary  Implanted ports are usually placed in the chest for long-term IV access.  Follow instructions from your health care provider about flushing the port and changing bandages (dressings).  Take care of the area around your port by avoiding clothing that puts pressure on the area, and by watching for signs of infection.  Protect the skin around your port from seat belts. Place a soft pad over your chest if needed.  Get help right away if you have a fever or you have redness, swelling, pain, drainage, or a bad smell at the port site. This information is not intended to replace advice given to you by your health care provider. Make sure you discuss any questions you have with your health care provider. Document Released: 12/06/2005 Document Revised: 01/08/2017 Document Reviewed: 01/08/2017 Elsevier  Interactive Patient Education  2019 [place].    Moderate Conscious Sedation, Adult, Care After These instructions provide you with information about caring for yourself after your procedure. Your health care provider may also give you more specific instructions. Your treatment has been planned according to current medical practices, but problems sometimes occur. Call your health care provider if you have any problems or questions after your procedure. What can I expect after the procedure? After your procedure, it is common:  To feel sleepy for several hours.  To feel clumsy and have poor balance for several hours.  To have poor judgment for several hours.  To vomit if you  eat too soon. Follow these instructions at home: For at least 24 hours after the procedure:   Do not: ? Participate in activities where you could fall or become injured. ? Drive. ? Use heavy machinery. ? Drink alcohol. ? Take sleeping pills or medicines that cause drowsiness. ? Make important decisions or sign legal documents. ? Take care of children on your own.  Rest. Eating and drinking  Follow the diet recommended by your health care provider.  If you vomit: ? Drink water, juice, or soup when you can drink without vomiting. ? Make sure you have little or no nausea before eating solid foods. General instructions  Have a responsible adult stay with you until you are awake and alert.  Take over-the-counter and prescription medicines only as told by your health care provider.  If you smoke, do not smoke without supervision.  Keep all follow-up visits as told by your health care provider. This is important. Contact a health care provider if:  You keep feeling nauseous or you keep vomiting.  You feel light-headed.  You develop a rash.  You have a fever. Get help right away if:  You have trouble breathing. This information is not intended to replace advice given to you by your health  care provider. Make sure you discuss any questions you have with your health care provider. Document Released: 09/26/2013 Document Revised: 05/10/2016 Document Reviewed: 03/27/2016 Elsevier Interactive Patient Education  2019 [employer].

## 2018-12-11 ENCOUNTER — Other Ambulatory Visit: Payer: Self-pay

## 2018-12-11 ENCOUNTER — Encounter: Payer: Self-pay | Admitting: Cardiovascular Disease

## 2018-12-11 ENCOUNTER — Ambulatory Visit (HOSPITAL_COMMUNITY): Payer: Medicare Other | Attending: Cardiovascular Disease

## 2018-12-11 DIAGNOSIS — Z5111 Encounter for antineoplastic chemotherapy: Secondary | ICD-10-CM | POA: Diagnosis not present

## 2018-12-11 DIAGNOSIS — I2510 Atherosclerotic heart disease of native coronary artery without angina pectoris: Secondary | ICD-10-CM | POA: Insufficient documentation

## 2018-12-11 DIAGNOSIS — Z951 Presence of aortocoronary bypass graft: Secondary | ICD-10-CM | POA: Diagnosis present

## 2018-12-11 DIAGNOSIS — I2583 Coronary atherosclerosis due to lipid rich plaque: Secondary | ICD-10-CM

## 2018-12-11 DIAGNOSIS — R0609 Other forms of dyspnea: Secondary | ICD-10-CM

## 2018-12-11 NOTE — Progress Notes (Signed)
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 Lymph node, biopsy, mediastinal - LYMPH NODE WITH METASTATIC ADENOCARCINOMA. - SEE MICROSCOPIC DESCRIPTION 2. Plaque, coronary artery - CALCIFIED ATHEROSCLEROTIC PLAQUE.    11/13/2018 Cancer Staging    Staging form: Gallbladder, AJCC 8th Edition - Clinical stage from 11/13/2018: Stage IVB (cTX, cN2, pM1) - Signed by Truitt Merle, MD on 12/15/2018    11/17/2018 Imaging    11/17/2018 CT CAP IMPRESSION: 1. 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 This is associated with extensive upper abdominal and retroperitoneal lymphadenopathy, as well as metastatic lymphadenopathy in the posterior mediastinum and left supraclavicular region. There is also a metastatic lesion to the left ischium. 2. 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 3. Aortic atherosclerosis, in addition to left main and 3 vessel coronary artery disease. Status post median sternotomy for CABG including LIMA to the LAD. 4. There are calcifications of the aortic valve. Echocardiographic correlation for evaluation of potential valvular dysfunction may be warranted if clinically indicated.     11/21/2018 Initial Diagnosis    Gallbladder cancer ([place])    11/27/2018 Pathology Results    11/27/2018 CA19-9  immunohistochemical stain Per request, a CA19-9 immunohistochemical stain was performed at an outside institution revealing positive staining in the tumor cells.     12/14/2018 -  Chemotherapy    The patient had palonosetron (ALOXI) injection 0.25 mg, 0.25 mg, Intravenous,  Once, 0 of 2 cycles CISplatin (PLATINOL) 38 mg in sodium chloride 0.9 % 250 mL chemo infusion, 25 mg/m2 = 38 mg, Intravenous,  Once, 0 of 2 cycles gemcitabine (GEMZAR) 1,520 mg in sodium chloride 0.9 % 100 mL chemo infusion, 1,000 mg/m2 = 1,520 mg, Intravenous,  Once, 0 of 2 cycles fosaprepitant (EMEND) 150 mg, dexamethasone (DECADRON) 12 mg in sodium chloride 0.9 % 145 mL IVPB, , Intravenous,  Once, 0 of 2 cycles  for chemotherapy treatment.      CURRENT THERAPY Pending chemo    INTERVAL HISTORY: BAYLEY YARBOROUGH is a 77 y.o. female who is here for follow-up. She is expected to start chemo today.  Today, she is here with her friend. She is doing well but complaining of stable  left leg ache. She uses a cane to ambulate. The pain started after a vein was taken out from her left leg during a heart surgery. She previously tried Tramadol, but it made her nauseated. She is allergic to codeine, but can tolerate hydrocodone as she tires it before a surgery previously. She denies hearing difficulties.    Pertinent positives and negatives of review of systems are listed and detailed within the above HPI.  REVIEW OF SYSTEMS:   Constitutional: Denies fevers, chills or abnormal weight loss Eyes: Denies blurriness of vision Ears, nose, mouth, throat, and face: Denies mucositis or sore throat Respiratory: Denies cough, dyspnea or wheezes Cardiovascular: Denies palpitation, chest discomfort or lower extremity  swelling Gastrointestinal:  Denies nausea, heartburn or change in bowel habits Skin: Denies abnormal skin rashes Lymphatics: Denies new lymphadenopathy or easy bruising Neurological:Denies numbness, tingling or new  weaknesses Behavioral/Psych: Mood is stable, no new changes  MSK: (+) left leg aching pain  All other systems were reviewed with the patient and are negative.  MEDICAL HISTORY:  Past Medical History:  Diagnosis Date  . Allergic rhinitis   . Arthritis   . Asthma    xolair s 8/05 ?11/07; mastered hfa 12/20/08  . Benign positional vertigo   . CAD (coronary artery disease)    a. 09/2014 NSTEMI s/p LHC with sig 2V dz. dLAD diffusely diseased and not suitable for PCI. unsuccessful RCA PCI d/t heavy calcifications  . GERD (gastroesophageal reflux disease)   . Heart attack ([place])    09/2014  . Hyperlipidemia    <130 ldl pos fm hx, bp  . Hypertension   . Osteopenia    dexa 08/22/07 AP spine + 1.1, left femur -1.3, right femur -.8; dexa 10/06/09 +1.6, left femur =1.6, right femur -.  . PONV (postoperative nausea and vomiting)   . Ruptured disc, cervical   . 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 BREAST SURGERY  10-26-10   Rt. breast bx--for microcalcifications in rt. retroareolar region--dx was hyalinized fibroadenoma  . BUNIONECTOMY Bilateral   . CARDIAC CATHETERIZATION     2015  . CATARACT EXTRACTION Right 02/02/2018   Dr Satira Sark  . CATARACT EXTRACTION Left 03/30/2018  . [place] SURGERY  12/01  . CORONARY ARTERY BYPASS GRAFT N/A 10/04/2018   Procedure: CORONARY ARTERY BYPASS GRAFTING (CABG) times 2 using left  Internal mammary artery to LAD, left greater saphenous vein - open harvest.;  Surgeon: [name], MD;  Location: [place];  Service: Open Heart Surgery;  Laterality: N/A;  . IR IMAGING GUIDED PORT INSERTION  12/08/2018  . LEFT HEART CATH AND CORONARY ANGIOGRAPHY N/A 09/27/2018   Procedure: LEFT HEART CATH AND CORONARY ANGIOGRAPHY;  Surgeon: Troy Sine, MD;  Location: [place] [facility];  Service: Cardiovascular;  Laterality: N/A;  . LEFT HEART CATHETERIZATION  WITH CORONARY ANGIOGRAM N/A 10/16/2014   Procedure: LEFT HEART CATHETERIZATION WITH CORONARY ANGIOGRAM;  Surgeon: Blane Ohara, MD;  Location: [facility] CATH LAB;  Service: Cardiovascular;  Laterality: N/A;  . Left L4-5 transforaminal lumbar interbody fusion with Depuy cage, rods and screws, local and allograft bone graft, Vivigen; bilateral decompression/partial hemilaminectomy lumbar five-sacral one  07/2017  . PERCUTANEOUS CORONARY ROTOBLATOR INTERVENTION (PCI-R) N/A 10/17/2014   Procedure: PERCUTANEOUS CORONARY ROTOBLATOR INTERVENTION (PCI-R);  Surgeon: Troy Sine, MD;  Location: [facility] CATH LAB;  Service: Cardiovascular;  Laterality: N/A;  . TEE WITHOUT CARDIOVERSION N/A 10/04/2018   Procedure: TRANSESOPHAGEAL ECHOCARDIOGRAM (TEE);  Surgeon: [name], MD;  Location: [employer];  Service: Open Heart Surgery;  Laterality: N/A;  . VEIN LIGATION AND STRIPPING Left   . VIDEO BRONCHOSCOPY Bilateral 02/05/2015   Procedure: VIDEO BRONCHOSCOPY WITHOUT FLUORO;  Surgeon: Tanda Rockers, MD;  Location: [facility];  Service: Endoscopy;  Laterality: Bilateral;  . VULVA /PERINEUM BIOPSY  12-30-10   --epidermoid cyst    I have reviewed the social history and family history with the patient and they are unchanged from previous note.  ALLERGIES:  is allergic to fish-derived products; penicillins; aspirin; brilinta [ticagrelor]; codeine phosphate; and diphenhydramine hcl.  MEDICATIONS:  Current Outpatient Medications  Medication Sig Dispense Refill  . albuterol (PROAIR HFA) 108 (90 Base) MCG/ACT inhaler INHALE 1 TO 2 PUFFS BY MOUTH EVERY 4 HOURS AS NEEDED FOR WHEEZING 8.5 g 3  . atorvastatin (LIPITOR) 80 MG tablet TAKE 1 TABLET(80 MG) BY MOUTH DAILY 90 tablet 1  . bisoprolol (ZEBETA) 10 MG tablet Take 1 tablet (10 mg total) by mouth daily. 90 tablet 1  . celecoxib (CELEBREX) 200 MG capsule Take 1 capsule (200 mg total) by mouth 2 (two) times daily. 60 capsule 1  . cholecalciferol 2000 units TABS Take  1 tablet (2,000 Units total) by mouth daily. 30 tablet 3  . clopidogrel (PLAVIX) 75 MG tablet Take 1 tablet (75 mg total) by mouth daily. 90 tablet 3  . clotrimazole-betamethasone (LOTRISONE) cream Apply 1 application topically as needed (itching). Use as directed for rash 30 g 2  . dextromethorphan-guaiFENesin (MUCINEX DM) 30-600 MG per 12 hr tablet Take 1-2 tablets by mouth every 12 (twelve) hours as needed for cough (with flutter).     . furosemide (LASIX) 20 MG tablet Take 2 tablets (40 mg total) by mouth daily. 90 tablet 3  . lidocaine-prilocaine (EMLA) cream Apply to affected area once 30 g 3  . meclizine (ANTIVERT) 25 MG tablet TAKE 2 TABLETS BY MOUTH THREE TIMES DAILY AS NEEDED 24 tablet 2  . montelukast (SINGULAIR) 10 MG tablet TAKE 1 TABLET(10 MG) BY MOUTH AT BEDTIME 90 tablet 0  . Multiple Vitamin (MULTIVITAMIN) capsule Take 1 capsule by mouth daily.     . ondansetron (ZOFRAN) 8 MG tablet Take 1 tablet (8 mg total) by mouth 2 (two) times daily as needed. Start on the third day after chemotherapy. 30 tablet 1  . oxyCODONE (OXY IR/ROXICODONE) 5 MG immediate release tablet Take 1 tablet (5 mg total) by mouth every 6 (six) hours as needed for severe pain. 25 tablet 0  . prochlorperazine (COMPAZINE) 10 MG tablet Take 1 tablet (10 mg total) by mouth every 6 (six) hours as needed (Nausea or vomiting). 30 tablet 1  . SYMBICORT 160-4.5 MCG/ACT inhaler INHALE 2 PUFFS BY MOUTH EVERY 12 HOURS 10.2 g 11   No current facility-administered medications for this visit.     PHYSICAL EXAMINATION: ECOG PERFORMANCE STATUS: 1 - Symptomatic but completely ambulatory  Vitals:   12/14/18 1135  BP: (!) 102/56  Pulse: 87  Resp: 17  Temp: (!) 96.9 F (36.1 C)  SpO2: 100%   Filed Weights   12/14/18 1135  Weight: 120 lb 14.4 oz (54.8 kg)    GENERAL:alert, no distress and comfortable (+) uses cane  SKIN: skin color, texture, turgor are normal, no rashes or significant lesions EYES: normal,  Conjunctiva are pink and non-injected, sclera clear OROPHARYNX:no exudate, no erythema and lips, buccal mucosa, and tongue normal  NECK: supple, thyroid normal size, non-tender, without nodularity LYMPH:  no palpable lymphadenopathy in the cervical, axillary or inguinal LUNGS: clear to auscultation and percussion with normal breathing effort HEART: regular rate & rhythm and no murmurs and no lower extremity edema ABDOMEN:abdomen soft, non-tender and normal bowel sounds Musculoskeletal:no cyanosis of digits and no clubbing  NEURO: alert & oriented x 3 with fluent speech, no focal motor/sensory deficits  LABORATORY DATA:  I have reviewed the data as listed CBC Latest Ref Rng & Units 12/08/2018 12/07/2018 11/27/2018  WBC 4.0 - 10.5 K/uL 9.6 11.9(H) 10.0  Hemoglobin 12.0 - 15.0 g/dL 10.1(L) 10.1(L) 10.7(L)  Hematocrit 36.0 - 46.0 % 34.0(L) 30.7(L) 32.9(L)  Platelets 150 - 400  K/uL 580(H) 607(H) 555.0(H)     CMP Latest Ref Rng & Units 12/14/2018 12/07/2018 11/27/2018  Glucose 70 - 99 mg/dL 114(H) 118(H) 120(H)  BUN 8 - 23 mg/dL 11 8 8   Creatinine 0.44 - 1.00 mg/dL 0.68 0.76 0.52  Sodium 135 - 145 mmol/L 140 140 138  Potassium 3.5 - 5.1 mmol/L 2.5(LL) 3.7 4.0  Chloride 98 - 111 mmol/L 100 97 103  CO2 22 - 32 mmol/L 31 21 25   Calcium 8.9 - 10.3 mg/dL 8.9 9.6 9.2  Total Protein 6.5 - 8.1 g/dL 7.8 7.9 -  Total Bilirubin 0.3 - 1.2 mg/dL 0.7 1.0 -  Alkaline Phos 38 - 126 U/L 110 130(H) -  AST 15 - 41 U/L 30 33 -  ALT 0 - 44 U/L 29 37(H) -    PATHOLOGY 11/27/2018 CA19-9 immunohistochemical stain Per request, a CA19-9 immunohistochemical stain was performed at an outside institution revealing positive staining in the tumor cells.  10/04/2018 Surgical Pathology Diagnosis 1. Lymph node, biopsy, mediastinal - LYMPH NODE WITH METASTATIC ADENOCARCINOMA. - SEE MICROSCOPIC DESCRIPTION 2. Plaque, coronary artery - CALCIFIED ATHEROSCLEROTIC PLAQUE. Microscopic Comment 1. The mediastinal lymph  node is mostly replaced by metastatic adenocarcinoma associated with necrosis and fibrosis. The adenocarcinoma focally extends into the attached adipose tissue and is characterized by glands and vague papillae with malignant features. Immunohistochemistry shows positivity with cytokeratin 7, PAX-8 and patchy positivity with estrogen receptor. The tumor is negative with cytokeratin 20, TTF-1 and Napsin-A. The differential includes a gynecologic primary.l Information Specimen(s) Obtained: 1. Lymph node, biopsy, mediastinal 2. Plaque, coronary artery Specimen Clinical Information 1. CAD (cm) 2. LAD (adc) Gross 1. Received fresh is a 1.3 cm firm nodule with gray white nodular cut surfaces. Touch preparations are made on one slides, and the specimen is bisected and entirely submitted in one block. 2. Received fresh is a 0.6 x 0.5 x 0.2 cm aggregate of yellow pink firm tissue/material, submitted in one block following decalcification. (SSW:kh 10/04/18) Stain(s) used in Diagnosis: The following stain(s) were used in diagnosing the case: CDX-2, CK-7, CK 20, PAX 8, Napsin-A, Thyroid Transcription Factor -1, ER - NOACIS. The control(s) stained appropriately.   RADIOGRAPHIC STUDIES: I have personally reviewed the radiological images as listed and agreed with the findings in the report. No results found.   11/17/2018 CT CAP IMPRESSION: 1. 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 This is associated with extensive upper abdominal and retroperitoneal lymphadenopathy, as well as metastatic lymphadenopathy in the posterior mediastinum and left supraclavicular region. There is also a metastatic lesion to the left ischium. 2. 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 3. Aortic atherosclerosis, in addition to left main and 3 vessel coronary artery disease. Status post median sternotomy for CABG including LIMA to the LAD. 4. There are  calcifications of the aortic valve. Echocardiographic correlation for evaluation of potential valvular dysfunction may be warranted if clinically indicated.   ASSESSMENT & PLAN:  BRETTE CAST is a 77 y.o. female with history of  1.  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  -Her PET scan showed hypermetabolic gallbladder mass, with extensive lymph nodes metastasis and left pelvic bone lesion.   -Plan to treat with first line chemo cisplatin and gemcitabine every 2 weeks.  -she is complaining of left leg aching pain since her cardiac surgery. The pain is stable, but slightly affects her ability to ambulate. She is  allergic to codeine, but can tolerate hydrocodone. I will prescribe NOR[place] today.  Patient feels her pain is probably related to the vein surgery for her CABF, although her significant left pelvic bone metastasis probably contributed to her pain also.  Discussed palliative radiation if pain gets worse. -Chemo consent was obtained last time.  She has participated the chemo class.  I educated her about warning symptoms like fever, fatigue, bruising, low appetite, or dehydration. She knows to call for any concerns.  -She saw her cardiologist last week, and had a repeated echocardiogram earlier this week.  It showed new onset low EF 40-45%. I will use lasix during her chemo due to her CHF  -first cycle tomorrow  -f/u in 2 weeks   2. CAD, S/P CABGX2 on 09/27/2018, EF 40-45%  -Currently on Plavix. -f/u with Dr. Claiborne Billings -Her recent echo showed EF 40 to 45%, I discussed with Dr. Evette Georges partner who is on-call today, and will add lasix during her chemo   3. Asthma -Currently on Symbicort, Singulair, and albuterol as needed -f/u with pulmonary  4. Goal of care discussion  -We again discussed the incurable nature of her cancer, and the overall poor prognosis, especially if she does not have good response to  chemotherapy or progress on chemo -The patient understands the goal of care is palliative. -she is full code for now   5. Social support  -She is widowed, lives alone, has 1 daughter who lives in [place], but not very close, and "not reliable" per pt -I previously referred to SW  6. Sever hypokalemia  -secondary to lasix  -she will take KCL 31meq 3 tabs today then 1 tab twice daily, and get IV KCL if needed tomorrow  -will check K and Mag tomorrow   Plan  -I prescribed [place] for her left thigh pain  -I discussed her cardiac issue with Dr. Evette Georges on-call partner today, she will f/u with Dr. Claiborne Billings closely during her chemo  -Labs today, first cycle chemo tomorrow  -f/u in 2 weeks before cycle 2   No problem-specific Assessment & Plan notes found for this encounter.   No orders of the defined types were placed in this encounter.  All questions were answered. The patient knows to call the clinic with any problems, questions or concerns. No barriers to learning was detected. I spent 20 minutes counseling the patient face to face. The total time spent in the appointment was 25 minutes and more than 50% was on counseling and review of test results  I, Noor Dweik am acting as scribe for Dr. Truitt Merle.  I have reviewed the above documentation for accuracy and completeness, and I agree with the above.     Truitt Merle, MD 12/14/2018

## 2018-12-12 ENCOUNTER — Other Ambulatory Visit: Payer: Self-pay | Admitting: *Deleted

## 2018-12-12 DIAGNOSIS — R7989 Other specified abnormal findings of blood chemistry: Secondary | ICD-10-CM

## 2018-12-12 DIAGNOSIS — R0609 Other forms of dyspnea: Principal | ICD-10-CM

## 2018-12-12 DIAGNOSIS — Z79899 Other long term (current) drug therapy: Secondary | ICD-10-CM

## 2018-12-12 MED ORDER — FUROSEMIDE 20 MG PO TABS: 40.0000 mg | ORAL_TABLET | Freq: Every day | ORAL | 3 refills | Status: DC

## 2018-12-14 ENCOUNTER — Telehealth: Payer: Self-pay | Admitting: Cardiovascular Disease

## 2018-12-14 ENCOUNTER — Telehealth: Payer: Self-pay

## 2018-12-14 ENCOUNTER — Inpatient Hospital Stay: Payer: Medicare Other

## 2018-12-14 ENCOUNTER — Telehealth: Payer: Self-pay | Admitting: Hematology

## 2018-12-14 ENCOUNTER — Inpatient Hospital Stay (HOSPITAL_BASED_OUTPATIENT_CLINIC_OR_DEPARTMENT_OTHER): Payer: Medicare Other | Admitting: Hematology

## 2018-12-14 ENCOUNTER — Encounter: Payer: Self-pay | Admitting: Hematology

## 2018-12-14 ENCOUNTER — Other Ambulatory Visit: Payer: Self-pay

## 2018-12-14 VITALS — BP 102/56 | HR 87 | Temp 96.9°F | Resp 17 | Ht 59.0 in | Wt 120.9 lb

## 2018-12-14 DIAGNOSIS — C7951 Secondary malignant neoplasm of bone: Secondary | ICD-10-CM | POA: Diagnosis not present

## 2018-12-14 DIAGNOSIS — I2510 Atherosclerotic heart disease of native coronary artery without angina pectoris: Secondary | ICD-10-CM

## 2018-12-14 DIAGNOSIS — E876 Hypokalemia: Secondary | ICD-10-CM | POA: Diagnosis not present

## 2018-12-14 DIAGNOSIS — C23 Malignant neoplasm of gallbladder: Secondary | ICD-10-CM

## 2018-12-14 DIAGNOSIS — R978 Other abnormal tumor markers: Secondary | ICD-10-CM

## 2018-12-14 DIAGNOSIS — Z95828 Presence of other vascular implants and grafts: Secondary | ICD-10-CM

## 2018-12-14 LAB — CMP (CANCER CENTER ONLY)
ALT: 29 U/L (ref 0–44)
AST: 30 U/L (ref 15–41)
Albumin: 2.6 g/dL — ABNORMAL LOW (ref 3.5–5.0)
Alkaline Phosphatase: 110 U/L (ref 38–126)
Anion gap: 9 (ref 5–15)
BUN: 11 mg/dL (ref 8–23)
CO2: 31 mmol/L (ref 22–32)
Calcium: 8.9 mg/dL (ref 8.9–10.3)
Chloride: 100 mmol/L (ref 98–111)
Creatinine: 0.68 mg/dL (ref 0.44–1.00)
GFR, Est AFR Am: >60 mL/min (ref >60)
GFR, Estimated: >60 mL/min (ref >60)
Glucose, Bld: 114 mg/dL — ABNORMAL HIGH (ref 70–99)
Potassium: 2.5 mmol/L — CL (ref 3.5–5.1)
SODIUM: 140 mmol/L (ref 135–145)
Total Bilirubin: 0.7 mg/dL (ref 0.3–1.2)
Total Protein: 7.8 g/dL (ref 6.5–8.1)

## 2018-12-14 LAB — CBC WITH DIFFERENTIAL (CANCER CENTER ONLY)
Abs Immature Granulocytes: 0.02 10*3/uL (ref 0.00–0.07)
Basophils Absolute: 0 10*3/uL (ref 0.0–0.1)
Basophils Relative: 0 %
Eosinophils Absolute: 0.2 10*3/uL (ref 0.0–0.5)
Eosinophils Relative: 3 %
HEMATOCRIT: 28.7 % — AB (ref 36.0–46.0)
Hemoglobin: 9 g/dL — ABNORMAL LOW (ref 12.0–15.0)
Immature Granulocytes: 0 %
Lymphocytes Relative: 12 %
Lymphs Abs: 0.9 10*3/uL (ref 0.7–4.0)
MCH: 26.7 pg (ref 26.0–34.0)
MCHC: 31.4 g/dL (ref 30.0–36.0)
MCV: 85.2 fL (ref 80.0–100.0)
Monocytes Absolute: 0.4 10*3/uL (ref 0.1–1.0)
Monocytes Relative: 5 %
Neutro Abs: 6 10*3/uL (ref 1.7–7.7)
Neutrophils Relative %: 80 %
Platelet Count: 508 10*3/uL — ABNORMAL HIGH (ref 150–400)
RBC: 3.37 MIL/uL — ABNORMAL LOW (ref 3.87–5.11)
RDW: 15.5 % (ref 11.5–15.5)
WBC Count: 7.6 10*3/uL (ref 4.0–10.5)
nRBC: 0 % (ref 0.0–0.2)

## 2018-12-14 LAB — FERRITIN: Ferritin: 13 ng/mL (ref 11–307)

## 2018-12-14 LAB — IRON AND TIBC
Iron: 29 ug/dL — ABNORMAL LOW (ref 41–142)
Saturation Ratios: 9 % — ABNORMAL LOW (ref 21–57)
TIBC: 320 ug/dL (ref 236–444)
UIBC: 291 ug/dL (ref 120–384)

## 2018-12-14 LAB — CEA (IN HOUSE-CHCC): CEA (CHCC-In House): 52.59 ng/mL — ABNORMAL HIGH (ref 0.00–5.00)

## 2018-12-14 MED ORDER — HYDROCODONE-ACETAMINOPHEN 5-325 MG PO TABS: 1.0000 | ORAL_TABLET | Freq: Four times a day (QID) | ORAL | 0 refills | Status: DC | PRN

## 2018-12-14 MED ORDER — POTASSIUM CHLORIDE CRYS ER 20 MEQ PO TBCR: 20.0000 meq | EXTENDED_RELEASE_TABLET | Freq: Two times a day (BID) | ORAL | 2 refills | Status: DC

## 2018-12-14 MED ORDER — SODIUM CHLORIDE 0.9% FLUSH
10.0000 mL | INTRAVENOUS | Status: DC | PRN
  Administered 2018-12-14: 10 mL via INTRAVENOUS
  Filled 2018-12-14: qty 10

## 2018-12-14 NOTE — Patient Instructions (Signed)

## 2018-12-14 NOTE — Telephone Encounter (Signed)
Left message for pt to call to schedule follow up appointment.

## 2018-12-14 NOTE — Telephone Encounter (Signed)
Spoke with patient informed her per Dr. Burr Medico Potassium from today's blood work is low, Dr. Burr Medico is sending in potassium to your pharmacy [facility], instructed her to take 3 of the 20 meq tablets today, then in the morning take one tablet prior to coming for her infusion.  Patient verbalized an understanding and agreed with the plan.

## 2018-12-14 NOTE — Telephone Encounter (Signed)
Phone call was made to Dr. Burr Medico regarding Ms. Avans's new echo.  There is a reduction in her systolic function compared with 09/2018.  She is scheduled to get her first chemo treatment tomorrow.  This will require 3 L of fluid to be administered.  After discussion with Dr. Burr Medico, they will plan to give her IV Lasix.  She will continue to take her home dose as scheduled.  We will not make any other changes to her cardiac medications at this time.  We will arrange for her to be seen in clinic next week.  Nadyne Gariepy C. Oval Linsey, MD, [facility] 12/14/2018 12:38 PM

## 2018-12-14 NOTE — Progress Notes (Signed)
Met with patient and person with her who brought proof of income for the one-time $700 [place].  Patient approved for the grant. Gave a copy of the approval letter as well and expense sheet along with the Outpatient pharmacy information. Discussed the expense sheet and how the expenses are paid. She verbalized understanding.  She has my card for any additional financial questions or concerns.

## 2018-12-14 NOTE — Telephone Encounter (Signed)
Left voice message for Dr. Claiborne Billings to call Dr. Burr Medico back on her cell phone.

## 2018-12-14 NOTE — Telephone Encounter (Signed)
Printed calendar and avs. °

## 2018-12-15 ENCOUNTER — Inpatient Hospital Stay: Payer: Medicare Other

## 2018-12-15 ENCOUNTER — Other Ambulatory Visit: Payer: Self-pay

## 2018-12-15 ENCOUNTER — Ambulatory Visit (HOSPITAL_BASED_OUTPATIENT_CLINIC_OR_DEPARTMENT_OTHER): Payer: Medicare Other | Admitting: Medical

## 2018-12-15 ENCOUNTER — Encounter: Payer: Self-pay | Admitting: Hematology

## 2018-12-15 ENCOUNTER — Other Ambulatory Visit: Payer: Self-pay | Admitting: Hematology

## 2018-12-15 VITALS — BP 109/68 | HR 77 | Temp 98.2°F | Resp 17

## 2018-12-15 DIAGNOSIS — Z7189 Other specified counseling: Secondary | ICD-10-CM

## 2018-12-15 DIAGNOSIS — R978 Other abnormal tumor markers: Secondary | ICD-10-CM | POA: Diagnosis not present

## 2018-12-15 DIAGNOSIS — K219 Gastro-esophageal reflux disease without esophagitis: Secondary | ICD-10-CM

## 2018-12-15 DIAGNOSIS — R079 Chest pain, unspecified: Secondary | ICD-10-CM

## 2018-12-15 DIAGNOSIS — E876 Hypokalemia: Secondary | ICD-10-CM

## 2018-12-15 DIAGNOSIS — C23 Malignant neoplasm of gallbladder: Secondary | ICD-10-CM

## 2018-12-15 LAB — BASIC METABOLIC PANEL - CANCER CENTER ONLY
Anion gap: 10 (ref 5–15)
BUN: 12 mg/dL (ref 8–23)
CO2: 29 mmol/L (ref 22–32)
CREATININE: 0.69 mg/dL (ref 0.44–1.00)
Calcium: 8.8 mg/dL — ABNORMAL LOW (ref 8.9–10.3)
Chloride: 101 mmol/L (ref 98–111)
Glucose, Bld: 102 mg/dL — ABNORMAL HIGH (ref 70–99)
Potassium: 3.3 mmol/L — ABNORMAL LOW (ref 3.5–5.1)
Sodium: 140 mmol/L (ref 135–145)

## 2018-12-15 LAB — MAGNESIUM: Magnesium: 1.4 mg/dL — CL (ref 1.7–2.4)

## 2018-12-15 MED ORDER — SODIUM CHLORIDE 0.9 % IV SOLN
Freq: Once | INTRAVENOUS | Status: AC
  Administered 2018-12-15: 15:00:00 via INTRAVENOUS
  Filled 2018-12-15: qty 500

## 2018-12-15 MED ORDER — SODIUM CHLORIDE 0.9% FLUSH
10.0000 mL | INTRAVENOUS | Status: DC | PRN
  Administered 2018-12-15: 10 mL
  Filled 2018-12-15: qty 10

## 2018-12-15 MED ORDER — SODIUM CHLORIDE 0.9 % IV SOLN
Freq: Once | INTRAVENOUS | Status: AC
  Administered 2018-12-15: 13:00:00 via INTRAVENOUS
  Filled 2018-12-15: qty 5

## 2018-12-15 MED ORDER — PALONOSETRON HCL INJECTION 0.25 MG/5ML
INTRAVENOUS | Status: AC
  Filled 2018-12-15: qty 5

## 2018-12-15 MED ORDER — FUROSEMIDE 10 MG/ML IJ SOLN
INTRAMUSCULAR | Status: AC
  Filled 2018-12-15: qty 2

## 2018-12-15 MED ORDER — MAGNESIUM OXIDE 400 (241.3 MG) MG PO TABS: 400.0000 mg | ORAL_TABLET | Freq: Every day | ORAL | 1 refills | Status: DC

## 2018-12-15 MED ORDER — FUROSEMIDE 10 MG/ML IJ SOLN
20.0000 mg | Freq: Once | INTRAMUSCULAR | Status: AC
  Administered 2018-12-15: 20 mg via INTRAVENOUS

## 2018-12-15 MED ORDER — SODIUM CHLORIDE 0.9 % IV SOLN
Freq: Once | INTRAVENOUS | Status: AC
  Administered 2018-12-15: 10:00:00 via INTRAVENOUS
  Filled 2018-12-15: qty 250

## 2018-12-15 MED ORDER — HEPARIN SOD (PORK) LOCK FLUSH 100 UNIT/ML IV SOLN
500.0000 [IU] | Freq: Once | INTRAVENOUS | Status: AC | PRN
  Administered 2018-12-15: 500 [IU]
  Filled 2018-12-15: qty 5

## 2018-12-15 MED ORDER — POTASSIUM CHLORIDE 2 MEQ/ML IV SOLN
Freq: Once | INTRAVENOUS | Status: AC
  Administered 2018-12-15: 11:00:00 via INTRAVENOUS
  Filled 2018-12-15: qty 10

## 2018-12-15 MED ORDER — ALUM & MAG HYDROXIDE-SIMETH 200-200-20 MG/5ML PO SUSP
ORAL | Status: AC
  Filled 2018-12-15: qty 30

## 2018-12-15 MED ORDER — SODIUM CHLORIDE 0.9 % IV SOLN
25.0000 mg/m2 | Freq: Once | INTRAVENOUS | Status: AC
  Administered 2018-12-15: 38 mg via INTRAVENOUS
  Filled 2018-12-15: qty 38

## 2018-12-15 MED ORDER — ALUM & MAG HYDROXIDE-SIMETH 200-200-20 MG/5ML PO SUSP
30.0000 mL | Freq: Once | ORAL | Status: AC
  Administered 2018-12-15: 30 mL via ORAL

## 2018-12-15 MED ORDER — SODIUM CHLORIDE 0.9 % IV SOLN
1000.0000 mg/m2 | Freq: Once | INTRAVENOUS | Status: AC
  Administered 2018-12-15: 1520 mg via INTRAVENOUS
  Filled 2018-12-15: qty 39.98

## 2018-12-15 MED ORDER — LIDOCAINE VISCOUS HCL 2 % MT SOLN
15.0000 mL | Freq: Once | OROMUCOSAL | Status: AC
  Administered 2018-12-15: 15 mL via ORAL
  Filled 2018-12-15: qty 15

## 2018-12-15 MED ORDER — PALONOSETRON HCL INJECTION 0.25 MG/5ML
0.2500 mg | Freq: Once | INTRAVENOUS | Status: AC
  Administered 2018-12-15: 0.25 mg via INTRAVENOUS

## 2018-12-15 NOTE — Progress Notes (Signed)
Patient verbalized relief from pain in chest.

## 2018-12-15 NOTE — Patient Instructions (Signed)
[facility] Discharge Instructions for Patients Receiving Chemotherapy  Today you received the following chemotherapy agents Gemcitabine and Cisplatin.  To help prevent nausea and vomiting after your treatment, we encourage you to take your nausea medication as directed.  If you develop nausea and vomiting that is not controlled by your nausea medication, call the clinic.   BELOW ARE SYMPTOMS THAT SHOULD BE REPORTED IMMEDIATELY:  *FEVER GREATER THAN 100.5 F  *CHILLS WITH OR WITHOUT FEVER  NAUSEA AND VOMITING THAT IS NOT CONTROLLED WITH YOUR NAUSEA MEDICATION  *UNUSUAL SHORTNESS OF BREATH  *UNUSUAL BRUISING OR BLEEDING  TENDERNESS IN MOUTH AND THROAT WITH OR WITHOUT PRESENCE OF ULCERS  *URINARY PROBLEMS  *BOWEL PROBLEMS  UNUSUAL RASH Items with * indicate a potential emergency and should be followed up as soon as possible.  Feel free to call the clinic should you have any questions or concerns. The clinic phone number is [phone].  Please show the [place] at check-in to the Emergency Department and triage nurse.  Gemcitabine injection What is this medicine? GEMCITABINE (jem SYE ta been) is a chemotherapy drug. This medicine is used to treat many types of cancer like breast cancer, lung cancer, pancreatic cancer, and ovarian cancer. This medicine may be used for other purposes; ask your health care provider or pharmacist if you have questions. COMMON BRAND NAME(S): Gemzar, Infugem What should I tell my health care provider before I take this medicine? They need to know if you have any of these conditions: -blood disorders -infection -kidney disease -liver disease -lung or breathing disease, like asthma -recent or ongoing radiation therapy -an unusual or allergic reaction to gemcitabine, other chemotherapy, other medicines, foods, dyes, or preservatives -pregnant or trying to get pregnant -breast-feeding How should I use this medicine? This  drug is given as an infusion into a vein. It is administered in a hospital or clinic by a specially trained health care professional. Talk to your pediatrician regarding the use of this medicine in children. Special care may be needed. Overdosage: If you think you have taken too much of this medicine contact a poison control center or emergency room at once. NOTE: This medicine is only for you. Do not share this medicine with others. What if I miss a dose? It is important not to miss your dose. Call your doctor or health care professional if you are unable to keep an appointment. What may interact with this medicine? -medicines to increase blood counts like filgrastim, pegfilgrastim, sargramostim -some other chemotherapy drugs like cisplatin -vaccines Talk to your doctor or health care professional before taking any of these medicines: -acetaminophen -aspirin -ibuprofen -ketoprofen -naproxen This list may not describe all possible interactions. Give your health care provider a list of all the medicines, herbs, non-prescription drugs, or dietary supplements you use. Also tell them if you smoke, drink alcohol, or use illegal drugs. Some items may interact with your medicine. What should I watch for while using this medicine? Visit your doctor for checks on your progress. This drug may make you feel generally unwell. This is not uncommon, as chemotherapy can affect healthy cells as well as cancer cells. Report any side effects. Continue your course of treatment even though you feel ill unless your doctor tells you to stop. In some cases, you may be given additional medicines to help with side effects. Follow all directions for their use. Call your doctor or health care professional for advice if you get a fever, chills or sore  throat, or other symptoms of a cold or flu. Do not treat yourself. This drug decreases your body's ability to fight infections. Try to avoid being around people who are  sick. This medicine may increase your risk to bruise or bleed. Call your doctor or health care professional if you notice any unusual bleeding. Be careful brushing and flossing your teeth or using a toothpick because you may get an infection or bleed more easily. If you have any dental work done, tell your dentist you are receiving this medicine. Avoid taking products that contain aspirin, acetaminophen, ibuprofen, naproxen, or ketoprofen unless instructed by your doctor. These medicines may hide a fever. Do not become pregnant while taking this medicine or for 6 months after stopping it. Women should inform their doctor if they wish to become pregnant or think they might be pregnant. Men should not father a child while taking this medicine and for 3 months after stopping it. There is a potential for serious side effects to an unborn child. Talk to your health care professional or pharmacist for more information. Do not breast-feed an infant while taking this medicine or for at least 1 week after stopping it. Men should inform their doctors if they wish to father a child. This medicine may lower sperm counts. Talk with your doctor or health care professional if you are concerned about your fertility. What side effects may I notice from receiving this medicine? Side effects that you should report to your doctor or health care professional as soon as possible: -allergic reactions like skin rash, itching or hives, swelling of the face, lips, or tongue -breathing problems -pain, redness, or irritation at site where injected -signs and symptoms of a dangerous change in heartbeat or heart rhythm like chest pain; dizziness; fast or irregular heartbeat; palpitations; feeling faint or lightheaded, falls; breathing problems -signs of decreased platelets or bleeding - bruising, pinpoint red spots on the skin, black, tarry stools, blood in the urine -signs of decreased red blood cells - unusually weak or tired,  feeling faint or lightheaded, falls -signs of infection - fever or chills, cough, sore throat, pain or difficulty passing urine -signs and symptoms of kidney injury like trouble passing urine or change in the amount of urine -signs and symptoms of liver injury like dark yellow or brown urine; general ill feeling or flu-like symptoms; light-colored stools; loss of appetite; nausea; right upper belly pain; unusually weak or tired; yellowing of the eyes or skin -swelling of ankles, feet, hands Side effects that usually do not require medical attention (report to your doctor or health care professional if they continue or are bothersome): -constipation -diarrhea -hair loss -loss of appetite -nausea -rash -vomiting This list may not describe all possible side effects. Call your doctor for medical advice about side effects. You may report side effects to FDA at 1-800-FDA-1088. Where should I keep my medicine? This drug is given in a hospital or clinic and will not be stored at home. NOTE: This sheet is a summary. It may not cover all possible information. If you have questions about this medicine, talk to your doctor, pharmacist, or health care provider.  2019 Elsevier/Gold Standard (2018-03-01 18:06:11)  Cisplatin injection What is this medicine? CISPLATIN (SIS pla tin) is a chemotherapy drug. It targets fast dividing cells, like cancer cells, and causes these cells to die. This medicine is used to treat many types of cancer like bladder, ovarian, and testicular cancers. This medicine may be used for other purposes; ask  your health care provider or pharmacist if you have questions. COMMON BRAND NAME(S): Platinol, Platinol -AQ What should I tell my health care provider before I take this medicine? They need to know if you have any of these conditions: -blood disorders -hearing problems -kidney disease -recent or ongoing radiation therapy -an unusual or allergic reaction to cisplatin,  carboplatin, other chemotherapy, other medicines, foods, dyes, or preservatives -pregnant or trying to get pregnant -breast-feeding How should I use this medicine? This drug is given as an infusion into a vein. It is administered in a hospital or clinic by a specially trained health care professional. Talk to your pediatrician regarding the use of this medicine in children. Special care may be needed. Overdosage: If you think you have taken too much of this medicine contact a poison control center or emergency room at once. NOTE: This medicine is only for you. Do not share this medicine with others. What if I miss a dose? It is important not to miss a dose. Call your doctor or health care professional if you are unable to keep an appointment. What may interact with this medicine? -dofetilide -foscarnet -medicines for seizures -medicines to increase blood counts like filgrastim, pegfilgrastim, sargramostim -probenecid -pyridoxine used with altretamine -rituximab -some antibiotics like amikacin, gentamicin, neomycin, polymyxin B, streptomycin, tobramycin -sulfinpyrazone -vaccines -zalcitabine Talk to your doctor or health care professional before taking any of these medicines: -acetaminophen -aspirin -ibuprofen -ketoprofen -naproxen This list may not describe all possible interactions. Give your health care provider a list of all the medicines, herbs, non-prescription drugs, or dietary supplements you use. Also tell them if you smoke, drink alcohol, or use illegal drugs. Some items may interact with your medicine. What should I watch for while using this medicine? Your condition will be monitored carefully while you are receiving this medicine. You will need important blood work done while you are taking this medicine. This drug may make you feel generally unwell. This is not uncommon, as chemotherapy can affect healthy cells as well as cancer cells. Report any side effects. Continue  your course of treatment even though you feel ill unless your doctor tells you to stop. In some cases, you may be given additional medicines to help with side effects. Follow all directions for their use. Call your doctor or health care professional for advice if you get a fever, chills or sore throat, or other symptoms of a cold or flu. Do not treat yourself. This drug decreases your body's ability to fight infections. Try to avoid being around people who are sick. This medicine may increase your risk to bruise or bleed. Call your doctor or health care professional if you notice any unusual bleeding. Be careful brushing and flossing your teeth or using a toothpick because you may get an infection or bleed more easily. If you have any dental work done, tell your dentist you are receiving this medicine. Avoid taking products that contain aspirin, acetaminophen, ibuprofen, naproxen, or ketoprofen unless instructed by your doctor. These medicines may hide a fever. Do not become pregnant while taking this medicine. Women should inform their doctor if they wish to become pregnant or think they might be pregnant. There is a potential for serious side effects to an unborn child. Talk to your health care professional or pharmacist for more information. Do not breast-feed an infant while taking this medicine. Drink fluids as directed while you are taking this medicine. This will help protect your kidneys. Call your doctor or health care professional  if you get diarrhea. Do not treat yourself. What side effects may I notice from receiving this medicine? Side effects that you should report to your doctor or health care professional as soon as possible: -allergic reactions like skin rash, itching or hives, swelling of the face, lips, or tongue -signs of infection - fever or chills, cough, sore throat, pain or difficulty passing urine -signs of decreased platelets or bleeding - bruising, pinpoint red spots on the  skin, black, tarry stools, nosebleeds -signs of decreased red blood cells - unusually weak or tired, fainting spells, lightheadedness -breathing problems -changes in hearing -gout pain -low blood counts - This drug may decrease the number of white blood cells, red blood cells and platelets. You may be at increased risk for infections and bleeding. -nausea and vomiting -pain, swelling, redness or irritation at the injection site -pain, tingling, numbness in the hands or feet -problems with balance, movement -trouble passing urine or change in the amount of urine Side effects that usually do not require medical attention (report to your doctor or health care professional if they continue or are bothersome): -changes in vision -loss of appetite -metallic taste in the mouth or changes in taste This list may not describe all possible side effects. Call your doctor for medical advice about side effects. You may report side effects to FDA at 1-800-FDA-1088. Where should I keep my medicine? This drug is given in a hospital or clinic and will not be stored at home. NOTE: This sheet is a summary. It may not cover all possible information. If you have questions about this medicine, talk to your doctor, pharmacist, or health care provider.  2019 Elsevier/Gold Standard (2008-03-12 14:40:54)

## 2018-12-15 NOTE — Progress Notes (Signed)
GI cocktail ordered by Sandi Mealy, PA.

## 2018-12-15 NOTE — Progress Notes (Signed)
Patient c/o pain mid chest which she describes as indigestion. Patient currently has post Cisplatin hydration fluid infusing. It has been infusing for approximately 45 minutes. Sandi Mealy, PA notified and ordered an EKG. Sandi Mealy, PA to infusion to assess patient.

## 2018-12-15 NOTE — Telephone Encounter (Signed)
Spoke with pt, she does not want to see the PA or NP she only wants to see dr Claiborne Billings. She does not understand why she has to be seen after chemo has been started. She knows about her low EF% on echo but still does not think there is a need to be seen. Aware dr Evette Georges schedule is full and will need to discuss with him on his return to the office. Pt agreed with this plan.

## 2018-12-18 ENCOUNTER — Telehealth: Payer: Self-pay | Admitting: *Deleted

## 2018-12-18 ENCOUNTER — Telehealth: Payer: Self-pay | Admitting: Cardiovascular Disease

## 2018-12-18 ENCOUNTER — Encounter: Payer: Medicare Other | Admitting: Adult Health

## 2018-12-18 DIAGNOSIS — C23 Malignant neoplasm of gallbladder: Secondary | ICD-10-CM

## 2018-12-18 NOTE — Telephone Encounter (Signed)
New Message   Pt c/o Shortness Of Breath: STAT if SOB developed within the last 24 hours or pt is noticeably SOB on the phone  1. Are you currently SOB (can you hear that pt is SOB on the phone)? No  2. How long have you been experiencing SOB? 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 3 months   3. Are you SOB when sitting or when up moving around? Moving around   4. Are you currently experiencing any other symptoms? No

## 2018-12-18 NOTE — Telephone Encounter (Signed)
Noted, thank you

## 2018-12-18 NOTE — Telephone Encounter (Signed)
TCT patient to follow up with her after her 1st treatment with Ciplatin and Gemzar. Spoke with patient. She states she is having some nausea but states it is relieved with compazine.  Denies any other symptoms/side effects from chemo. She is aware of her upcoming appts.

## 2018-12-18 NOTE — Telephone Encounter (Signed)
-----   Message from Georgianne Fick, RN sent at 12/15/2018  5:42 PM EST ----- Regarding: Dr. Burr Medico First time Cisplatin/Gemzar Patient received first time cisplatin/Gemzar and tolerated these well.

## 2018-12-18 NOTE — Telephone Encounter (Signed)
Returned call to patient, she states her SOB has not changed since last OV but needs appt to discuss her echo per Dr. Oval Linsey and oncology.  She is doing okay other than being sick from chemotherapy that was started Friday.    appt made with Dr. Claiborne Billings Monday 1/6 at 1040 am

## 2018-12-18 NOTE — Progress Notes (Signed)
  Oncology Nurse Navigator Documentation  Navigator Location: [facility] (12/18/18 1124)  Called patient to touch base after her initial chemo infusion on 12/15/18. Patient lives alone and is supported by her neighbor who takes her to txs and checks in on her.  Patient stated that she is experiencing nausea and some mild SOB on exhertion. She plans to make an appointment with her cardiologist soon. Patient also stated that she had some arm and feet swelling yesterday that has resolved.  I educated patient on the use of her prochlorperazine and ondansetron. Patient encouraged to take a nausea medication 30 minutes prior to eating her most substantial meal of the day. I also made patient aware of [facility]'s traige nurses and symptom management clinic. She agreed to call clinic with any questions or concerns. I plan to meet with her on 1/10//19 during her next infusion. She is also aware that I can be contacted for support. Referral to social work to assess for potential supports.

## 2018-12-19 ENCOUNTER — Ambulatory Visit: Payer: Self-pay | Admitting: Thoracic Surgery (Cardiothoracic Vascular Surgery)

## 2018-12-21 ENCOUNTER — Other Ambulatory Visit: Payer: Medicare Other

## 2018-12-21 ENCOUNTER — Ambulatory Visit: Payer: Medicare Other | Admitting: Adult Health

## 2018-12-21 ENCOUNTER — Ambulatory Visit: Payer: Medicare Other

## 2018-12-22 NOTE — Progress Notes (Signed)
I was asked to see this patient as she was receiving post hydration after completion of cisplatin.  She reported that she was having mid chest pain which she described as indigestion.  An EKG was completed and was compared to an EKG from 12/06/2018.  The EKG showed normal sinus rhythm at 77 bpm with a possible septal infarct of undetermined age.  Also noted was ST and T wave abnormalities with consideration given for inferior lateral ischemia.  This was compared to her previous EKG and was found to be stable.  The patient was given a GI cocktail with relief of her symptoms following dosing.  Sandi Mealy, MHS, PA-C Physician Assistant

## 2018-12-25 ENCOUNTER — Encounter: Payer: Self-pay | Admitting: Cardiovascular Disease

## 2018-12-25 ENCOUNTER — Ambulatory Visit: Payer: Medicare Other | Admitting: Cardiovascular Disease

## 2018-12-25 VITALS — BP 114/74 | HR 78 | Ht 59.0 in | Wt 117.4 lb

## 2018-12-25 DIAGNOSIS — I10 Essential (primary) hypertension: Secondary | ICD-10-CM

## 2018-12-25 DIAGNOSIS — Z951 Presence of aortocoronary bypass graft: Secondary | ICD-10-CM | POA: Diagnosis not present

## 2018-12-25 DIAGNOSIS — I255 Ischemic cardiomyopathy: Secondary | ICD-10-CM | POA: Diagnosis not present

## 2018-12-25 DIAGNOSIS — C799 Secondary malignant neoplasm of unspecified site: Secondary | ICD-10-CM

## 2018-12-25 DIAGNOSIS — I2510 Atherosclerotic heart disease of native coronary artery without angina pectoris: Secondary | ICD-10-CM | POA: Diagnosis not present

## 2018-12-25 DIAGNOSIS — I2583 Coronary atherosclerosis due to lipid rich plaque: Secondary | ICD-10-CM

## 2018-12-25 NOTE — Progress Notes (Signed)
Patient ID: Cassie Campbell, female   DOB: Oct 09, 1941, 78 y.o.   MRN: [id number]     HPI: Cassie Campbell is a 78 y.o. female who presents for a one month  follow-up cardiology evaluation.  Ms. Cassie Campbell has a history of complex CAD and underwent catheterization by Dr. Fletcher Anon with class IV symptoms on 10/16/2014.  She was found to have diffusely diseased LAD, which was not suitable for revascularization.  She had ruled in for non-ST segment elevation myocardial infarction, felt to be due to a severely calcified almost subtotal RCA with severe diffuse calcified disease.  Initial attempt at PCI by Dr. Fletcher Anon was unsuccessful due to the severe calcification.  She was brought back to the laboratory the following day, and I performed a very long attempt at high-speed rotational atherectomy.  Unfortunately, due to the severely stenosed calcified lesion above the acute margin the Roto floppy wire was never be advanced distal enough due to severe disease beyond this site to allow the burr to be inserted to reach the stenosis.  Multiple attempts were made to utilize wire transfer, but even the smallest catheter was never able to pass the stenosis to allow for the Roto floppy wire to be reinserted in exchange for a Fielder XT wire, which was able to cross the stenosis and advanced distally.  Consequently, the procedure was aborted.  She was sent home on increased medication regimen with potential plans for possible follow-up evaluation depending upon symptom status.  Since hospital discharge, she has not experienced any significant recurrent anginal chest pain.  During the hospitalization ranolazine was added initially at 500 mg twice a day to isosorbide mononitrate, amlodipine, and beta blocker therapy.  She has been treated with aspirin and Brilinta for dual antiplatelet therapy. When I saw her for subsequent evaluation I further titrated her ranolazine to 1000 mg twice a day and further titrated her Lopressor to 37.5  mg twice a day.  Her creatinine remained normal following her contrast load and she was mildly anemic.    She has a history of asthma and is on Symbicort 160-4.5 and Singulair.  She  has a history of hyperlipidemia, currently on Lipitor 80 mg.  She does have GERD for which he takes Pepcid as well as omeprazole.  She developed some increased wheezing on Lopressor and was changed to bisoprolol 5 mg in place of metoprolol.  She has seen Dr. Melvyn Novas for her severe chronic asthma and in the past.  She also was found to have marked atopy with significant elevation of IgG E levels.  She feels that her breathing has improved with the changed to bisoprolol from Lopressor.   In March 2016 she had been remaining stable but  had experienced a 10-15 minute episode of chest pain during the evening which responded to nitroglycerin. When I saw her the following day she had been taking isosorbide 90 mg in the morning and I added isosorbide 30 mg at bedtime to her medical regimen of Ranexa 1000 mg twice a day , amlodipine 10 mg daily, and bisoprolol 5 mg. She denies recurrent chest pain symptomatology since the nocturnal dose of Imdur was added.  She developed a rash and  was advised to stop both Brilinta and Ranexa.  She was started on Plavix. Her rash ultimately resolved.    Her husband passed away in on Dec 21, 2015.  She has been more active.  She denies nocturnal symptoms.  She was evaluated in the emergency room in January 2017 with  right shoulder discomfort.    She underwent low back surgery in August 2018 by Dr. Louanne Skye. She tolerated surgery well.  A preoperative nuclear study showed an EF of 70% with probable apical soft tissue attenuation.  There was no ischemia.  Last saw her in November 2018 and she was doing well without chest pain or shortness of breath.  Although she has a history of mild asthma.   She was diagnosed with GERD.  She underwent successful cataract surgery right eye in February and left eye in April  2019 by Dr. Satira Sark.  Since I  saw her in June 2019 she was seen by Kerin Ransom on September 26, 2018 with a history suggestive of recurrent unstable angina.  She was hospitalized and on September 27, 2018 repeat cardiac catheterization by me was performed.  This revealed severe coronary calcification involving the LAD circumflex and RCA.  There was smooth 20 to 25% distal left main narrowing.  The LAD had 40% proximal calcified stenosis.  The LAD was severely calcified with 95% stenosis in the region of the first diagonal and there was diffuse disease in the midsegment of 50% followed by 90% mid stenosis and 80% distal stenosis.  The circumflex was calcified without high-grade obstructive disease.  The RCA was calcified and was totally occluded just prior to the acute margin.  There was extensive left-to-right collateralization to the distal RCA via the left circulation.  There was low normal LV function with an EF of 50 to 55% with a small region of focal mid mild anterolateral and posterior basal inferior hypocontractility.  Due to the severe calcification CABG revascularization surgery was recommended.  She underwent CABG surgery x2 with her LIMA to her LAD and SVG to the PDA.  However, during surgery she was instantly found to have a mediastinal lymph node which was sent for pathology and unfortunately showed metastatic adenocarcinoma.  She is now being followed by Dr. 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  PET and CT imaging of her chest abdomen and pelvis with contrast showed diffuse adenopathy from supraclavicular to the abdomen with a hypermetabolic mass in the gallbladder and large hypermetabolic bony lesion in the posterior left pelvis.    I last saw her on December 06, 2018 in follow-up of her CABG revascularization and oncologic assessments.  She underwent successful Port-A-Cath placement on December 20 and Plavix was held for this procedure.  I recommended  that she undergo an echo Doppler study prior to initiating chemotherapy.  Her echo Doppler study was done on December 11, 2018 and showed an EF of 40 to 45% status post revascularization with residual akinesis of the mid apical anteroseptal, inferoseptal and apical wall.  She had grade 1 diastolic dysfunction.  Tissue Doppler was consistent with high ventricular filling pressures.  She had mildly increased PA pressure 38 mm and abnormal global longitudinal strain pattern.  On December 15, 2018 she initiated her first course of chemotherapy.  She had some mild nausea but otherwise felt well.  She is feeling better.  She is scheduled to have a follow-up oncology evaluation later this week.  From a cardiac perspective, she denies any chest pain or shortness of breath.  She is now able to walk from room to room without her previous dyspnea or chest discomfort.  She feels improved.  She presents for evaluation.  Past Medical History:  Diagnosis Date  . Allergic rhinitis   . Arthritis   . Asthma  xolair s 8/05 ?11/07; mastered hfa 12/20/08  . Benign positional vertigo   . CAD (coronary artery disease)    a. 09/2014 NSTEMI s/p LHC with sig 2V dz. dLAD diffusely diseased and not suitable for PCI. unsuccessful RCA PCI d/t heavy calcifications  . GERD (gastroesophageal reflux disease)   . Heart attack ([place])    09/2014  . Hyperlipidemia    <130 ldl pos fm hx, bp  . Hypertension   . Osteopenia    dexa 08/22/07 AP spine + 1.1, left femur -1.3, right femur -.8; dexa 10/06/09 +1.6, left femur =1.6, right femur -.  . PONV (postoperative nausea and vomiting)   . Ruptured disc, cervical   . 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 BREAST SURGERY  10-26-10   Rt. breast bx--for microcalcifications in rt. retroareolar region--dx was hyalinized fibroadenoma  . BUNIONECTOMY Bilateral   . CARDIAC CATHETERIZATION     2015  . CATARACT  EXTRACTION Right 02/02/2018   Dr Satira Sark  . CATARACT EXTRACTION Left 03/30/2018  . [place] SURGERY  12/01  . CORONARY ARTERY BYPASS GRAFT N/A 10/04/2018   Procedure: CORONARY ARTERY BYPASS GRAFTING (CABG) times 2 using left  Internal mammary artery to LAD, left greater saphenous vein - open harvest.;  Surgeon: [name], MD;  Location: [place] [facility];  Service: Open Heart Surgery;  Laterality: N/A;  . IR IMAGING GUIDED PORT INSERTION  12/08/2018  . LEFT HEART CATH AND CORONARY ANGIOGRAPHY N/A 09/27/2018   Procedure: LEFT HEART CATH AND CORONARY ANGIOGRAPHY;  Surgeon: Troy Sine, MD;  Location: [facility];  Service: Cardiovascular;  Laterality: N/A;  . LEFT HEART CATHETERIZATION WITH CORONARY ANGIOGRAM N/A 10/16/2014   Procedure: LEFT HEART CATHETERIZATION WITH CORONARY ANGIOGRAM;  Surgeon: Blane Ohara, MD;  Location: [facility] CATH LAB;  Service: Cardiovascular;  Laterality: N/A;  . Left L4-5 transforaminal lumbar interbody fusion with Depuy cage, rods and screws, local and allograft bone graft, Vivigen; bilateral decompression/partial hemilaminectomy lumbar five-sacral one  07/2017  . PERCUTANEOUS CORONARY ROTOBLATOR INTERVENTION (PCI-R) N/A 10/17/2014   Procedure: PERCUTANEOUS CORONARY ROTOBLATOR INTERVENTION (PCI-R);  Surgeon: Troy Sine, MD;  [facility] C[facility] LAB;  Service: Cardiovascular;  Laterality: N/A;  . TEE WITHOUT CARDIOVERSION N/A 10/04/2018   Procedure: TRANSESOPHAGEAL ECHOCARDIOGRAM (TEE);  Surgeon: [name], MD;  Location: [place];  Service: Open Heart Surgery;  Laterality: N/A;  . VEIN LIGATION AND STRIPPING Left   . VIDEO BRONCHOSCOPY Bilateral 02/05/2015   Procedure: VIDEO BRONCHOSCOPY WITHOUT FLUORO;  Surgeon: Tanda Rockers, MD;  Location: [facility] ENDOSCOPY;  Service: Endoscopy;  Laterality: Bilateral;  . VULVA /PERINEUM BIOPSY  12-30-10   --epidermoid cyst    Allergies  Allergen Reactions  . Fish-Derived Products Shortness Of Breath, Swelling  and Rash  . Penicillins Shortness Of Breath, Swelling and Rash    PATIENT HAS HAD A PCN REACTION WITH IMMEDIATE RASH, FACIAL/TONGUE/THROAT SWELLING, SOB, OR LIGHTHEADEDNESS WITH HYPOTENSION:  #  #  #  YES  #  #  #   Has patient had a PCN reaction causing severe rash involving mucus membranes or skin necrosis: No PATIENT HAS HAD A PCN REACTION THAT REQUIRED HOSPITALIZATION:  #  #  #  YES  #  #  #   Has patient had a PCN reaction occurring within the last 10 years: No    . Aspirin Swelling and Rash    SWELLING REACTION  UNSPECIFIED   . Brilinta [Ticagrelor] Rash    Started Brilinta 09/2014 - rash - unsure which it is directly related to  . Codeine Phosphate Nausea Only  . Diphenhydramine Hcl Rash    Current Outpatient Medications  Medication Sig Dispense Refill  . albuterol (PROAIR HFA) 108 (90 Base) MCG/ACT inhaler INHALE 1 TO 2 PUFFS BY MOUTH EVERY 4 HOURS AS NEEDED FOR WHEEZING 8.5 g 3  . atorvastatin (LIPITOR) 80 MG tablet TAKE 1 TABLET(80 MG) BY MOUTH DAILY 90 tablet 1  . bisoprolol (ZEBETA) 10 MG tablet Take 1 tablet (10 mg total) by mouth daily. 90 tablet 1  . celecoxib (CELEBREX) 200 MG capsule Take 1 capsule (200 mg total) by mouth 2 (two) times daily. 60 capsule 1  . cholecalciferol 2000 units TABS Take 1 tablet (2,000 Units total) by mouth daily. 30 tablet 3  . clopidogrel (PLAVIX) 75 MG tablet Take 1 tablet (75 mg total) by mouth daily. 90 tablet 3  . clotrimazole-betamethasone (LOTRISONE) cream Apply 1 application topically as needed (itching). Use as directed for rash 30 g 2  . dextromethorphan-guaiFENesin (MUCINEX DM) 30-600 MG per 12 hr tablet Take 1-2 tablets by mouth every 12 (twelve) hours as needed for cough (with flutter).     . furosemide (LASIX) 20 MG tablet Take 2 tablets (40 mg total) by mouth daily. 90 tablet 3  . HYDROcodone-acetaminophen (NORCO/VICODIN) 5-325 MG tablet Take 1 tablet by mouth every 6 (six) hours as needed for moderate pain. 30 tablet 0  .  lidocaine-prilocaine (EMLA) cream Apply to affected area once 30 g 3  . magnesium oxide (MAG-OX) 400 (241.3 Mg) MG tablet Take 1 tablet (400 mg total) by mouth daily. 30 tablet 1  . meclizine (ANTIVERT) 25 MG tablet TAKE 2 TABLETS BY MOUTH THREE TIMES DAILY AS NEEDED 24 tablet 2  . montelukast (SINGULAIR) 10 MG tablet TAKE 1 TABLET(10 MG) BY MOUTH AT BEDTIME 90 tablet 0  . Multiple Vitamin (MULTIVITAMIN) capsule Take 1 capsule by mouth daily.     . ondansetron (ZOFRAN) 8 MG tablet Take 1 tablet (8 mg total) by mouth 2 (two) times daily as needed. Start on the third day after chemotherapy. 30 tablet 1  . oxyCODONE (OXY IR/ROXICODONE) 5 MG immediate release tablet Take 1 tablet (5 mg total) by mouth every 6 (six) hours as needed for severe pain. 25 tablet 0  . potassium chloride SA (K-DUR,KLOR-CON) 20 MEQ tablet Take 1 tablet (20 mEq total) by mouth 2 (two) times daily. 60 tablet 2  . prochlorperazine (COMPAZINE) 10 MG tablet Take 1 tablet (10 mg total) by mouth every 6 (six) hours as needed (Nausea or vomiting). 30 tablet 1  . SYMBICORT 160-4.5 MCG/ACT inhaler INHALE 2 PUFFS BY MOUTH EVERY 12 HOURS 10.2 g 11   No current facility-administered medications for this visit.     Social History   Socioeconomic History  . Marital status: Widowed    Spouse name: Not on file  . Number of children: Not on file  . Years of education: Not on file  . Highest education level: Not on file  Occupational History  . Occupation: retired Tour manager  Social Needs  . Financial resource strain: Not on file  . Food insecurity:    Worry: Not on file    Inability: Not on file  . Transportation needs:    Medical: Not on file    Non-medical: Not on file  Tobacco Use  . Smoking status: Never Smoker  .  Smokeless tobacco: Never Used  Substance and Sexual Activity  . Alcohol use: No    Alcohol/week: 0.0 standard drinks  . Drug use: No  . Sexual activity: Not Currently    Partners: Male    Birth  control/protection: Post-menopausal  Lifestyle  . Physical activity:    Days per week: Not on file    Minutes per session: Not on file  . Stress: Not on file  Relationships  . Social connections:    Talks on phone: Not on file    Gets together: Not on file    Attends religious service: Not on file    Active member of club or organization: Not on file    Attends meetings of clubs or organizations: Not on file    Relationship status: Not on file  . Intimate partner violence:    Fear of current or ex partner: Not on file    Emotionally abused: Not on file    Physically abused: Not on file    Forced sexual activity: Not on file  Other Topics Concern  . Not on file  Social History Narrative  . Not on file    Family History  Problem Relation Age of Onset  . Diabetes Mother        AODM  . Stroke Mother   . Hypertension Mother   . Heart disease Father   . Diabetes Sister        AODM  . Hypertension Sister   . Breast cancer Daughter 68       dec--mets to liver/spine    ROS General: Negative; No fevers, chills, or night sweats HEENT: This post recent cataract surgery in both eyes in February and April 2019; no sinus congestion, difficulty swallowing Pulmonary: positive for asthma; No cough, wheezing, shortness of breath, hemoptysis Cardiovascular: See HPI:  GI: Negative; No nausea, vomiting, diarrhea, or abdominal pain GU: Negative; No dysuria, hematuria, or difficulty voiding Musculoskeletal: Occasional right shoulder discomfort, and low back discomfort Hematologic/Oncologic: Recently diagnosed metastatic adenocarcinoma, 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 Other comprehensive 14 point system review is negative   Physical Exam BP 114/74   Pulse 78   Ht 4' 11" (1.499 m)   Wt 117 lb 6.4 oz (53.3 kg)   LMP 12/21/1999   BMI 23.71 kg/m    Repeat blood pressure by me was 118/68  Wt Readings from Last 3 Encounters:  12/25/18 117 lb 6.4 oz (53.3 kg)  12/14/18 120 lb 14.4 oz (54.8 kg)  12/08/18 119 lb (54 kg)    General: Alert, oriented, no distress.  Skin: normal turgor, no rashes, warm and dry HEENT: Normocephalic, atraumatic. Pupils equal round and reactive to light; sclera anicteric; extraocular muscles intact;  Nose without nasal septal hypertrophy Mouth/Parynx benign; Mallinpatti scale Neck: No JVD, no carotid bruits; normal carotid upstroke Lungs: clear to ausculatation and percussion; no wheezing or rales Chest wall: without tenderness to palpitation Heart: PMI not displaced, RRR, s1 s2 normal, 1/6 systolic murmur, no diastolic murmur, no rubs, gallops, thrills, or heaves Abdomen: Minimal right upper quadrant tenderness, if any soft, nontender; no hepatosplenomehaly, BS+; abdominal aorta nontender and not dilated by palpation. Back: no CVA tenderness Pulses 2+ Musculoskeletal: full range of motion, normal strength, no joint deformities Extremities: no clubbing cyanosis  or edema, Homan's sign negative  Neurologic: grossly nonfocal; Cranial nerves grossly wnl Psychologic: Normal mood and affect  ECG (independently read by me): Normal sinus rhythm at 78 bpm.  Biatrial enlargement.  QS complex V1 through V3.  Anterolateral T wave abnormality.  December 2019 ECG (independently read by me): Sinus rhythm at 76 bpm.  QS complex anteroseptally.  Lateral T wave abnormality.    June 2019 ECG (independently read by me): Normal sinus rhythm 81 bpm.  Right bundle branch block with repolarization changes.  November 16, 2017 ECG (independently read by me): Normal sinus rhythm at 75 bpm with right bundle branch block.  Left  axis deviation.  Q waves in 3 and aVF.  April 2018 ECG (independently read by me): Normal sinus rhythm at 74 bpm.  Right bundle-branch block with repolarization changes.  October 2017 ECG (independently read by me): Normal sinus rhythm at 68 bpm.  The right bundle branch block with repolarization changes.  April 2017 ECG (independently read by me): Normal sinus rhythm at 70 bpm.  Right bundle-branch block with repolarization changes.  November 2016 ECG (independently read by me): Normal sinus rhythm at 70 bpm.  Right bundle-branch block with repolarization changes.  May 2016 ECG (independently read by me):  Normal sinus rhythm at 65 bpm. Right bundle branch block with repolarization changes.  March 2016 ECG (independently read by me): Normal sinus rhythm at 68 bpm.  No ectopy.  No signal been ST segment changes.  December 2015 ECG (independently read by me): Normal sinus rhythm at 77 bpm.  Normal intervals.  No significant ST changes.  November 2015 ECG (independently read by me):sinus rhythm at 88 bpm.  QTc interval 454 ms.  No significant ST-T changes.  LABS:  BMP Latest Ref Rng & Units 12/15/2018 12/14/2018 12/07/2018  Glucose 70 - 99 mg/dL 102(H) 114(H) 118(H)  BUN 8 - 23 mg/dL _0 Creatinine 0.44 - 1.00 mg/dL 0.69 0.68 0.76  BUN/Creat Ratio 12 - 28 - - 11(L)  Sodium 135 - 145 mmol/L 140 140 140  Potassium 3.5 - 5.1 mmol/L 3.3(L) 2.5(LL) 3.7  Chloride 98 - 111 mmol/L 101 100 97  CO2 22 - 32 mmol/L _1 Calcium 8.9 - 10.3 mg/dL 8.8(L) 8.9 9.6    Hepatic Function Latest Ref Rng & Units 12/14/2018 12/07/2018 09/26/2018  Total Protein 6.5 - 8.1 g/dL 7.8 7.9 9.0(H)  Albumin 3.5 - 5.0 g/dL 2.6(L) 3.5 3.4(L)  AST 15 - 41 U/L 30 33 32  ALT 0 - 44 U/L 29 37(H) 20  Alk Phosphatase 38 - 126 U/L 110 130(H) 93  Total Bilirubin 0.3 - 1.2 mg/dL 0.7 1.0 0.6  Bilirubin, Direct 0.0 - 0.3 mg/dL - - -    CBC Latest Ref Rng & Units 12/14/2018 12/08/2018 12/07/2018  WBC 4.0 - 10.5  K/uL 7.6 9.6 11.9(H)  Hemoglobin 12.0 - 15.0 g/dL 9.0(L) 10.1(L) 10.1(L)  Hematocrit 36.0 - 46.0 % 28.7(L) 34.0(L) 30.7(L)  Platelets 150 - 400 K/uL 508(H) 580(H) 607(H)   Lab Results  Component Value Date   MCV 85.2 12/14/2018   MCV 88.1 12/08/2018   MCV 82 12/07/2018   Lab Results  Component Value Date   TSH 1.880 12/07/2018   Lab Results  Component Value Date   HGBA1C 5.9 (H) 10/04/2018    Lipid Panel     Component Value Date/Time   CHOL 145 12/07/2018 0859   TRIG 101 12/07/2018 0859   TRIG 85 10/05/2006 1022  HDL 47 12/07/2018 0859   CHOLHDL 3.1 12/07/2018 0859   CHOLHDL 3 03/09/2018 0909   VLDL 21.8 03/09/2018 0909   LDLCALC 78 12/07/2018 0859   RADIOLOGY: Ir Imaging Guided Port Insertion  Result Date: 12/08/2018 CLINICAL DATA:  Gallbladder carcinoma, access for chemotherapy EXAM: RIGHT INTERNAL JUGULAR SINGLE LUMEN POWER PORT CATHETER INSERTION Date:  12/08/2018 12/08/2018 3:18 pm Radiologist:  Jerilynn Mages. Daryll Brod, MD Guidance:  Ultrasound and fluoroscopic MEDICATIONS: 1 g vancomycin; The antibiotic was administered within an appropriate time interval prior to skin puncture. ANESTHESIA/SEDATION: Versed 2.0 mg IV; Fentanyl 100 mcg IV; Moderate Sedation Time:  32 minutes The patient was continuously monitored during the procedure by the interventional radiology nurse under my direct supervision. FLUOROSCOPY TIME:  0 minutes, 24 seconds (2 mGy) COMPLICATIONS: None immediate. CONTRAST:  None. PROCEDURE: Informed consent was obtained from the patient following explanation of the procedure, risks, benefits and alternatives. The patient understands, agrees and consents for the procedure. All questions were addressed. A time out was performed. Maximal barrier sterile technique utilized including caps, mask, sterile gowns, sterile gloves, large sterile drape, hand hygiene, and 2% chlorhexidine scrub. Under sterile conditions and local anesthesia, right internal jugular micropuncture  venous access was performed. Access was performed with ultrasound. Images were obtained for documentation of the patent right internal jugular vein. A guide wire was inserted followed by a transitional dilator. This allowed insertion of a guide wire and catheter into the IVC. Measurements were obtained from the SVC / RA junction back to the right IJ venotomy site. In the right infraclavicular chest, a subcutaneous pocket was created over the second anterior rib. This was done under sterile conditions and local anesthesia. 1% lidocaine with epinephrine was utilized for this. A 2.5 cm incision was made in the skin. Blunt dissection was performed to create a subcutaneous pocket over the right pectoralis major muscle. The pocket was flushed with saline vigorously. There was adequate hemostasis. The port catheter was assembled and checked for leakage. The port catheter was secured in the pocket with two retention sutures. The tubing was tunneled subcutaneously to the right venotomy site and inserted into the SVC/RA junction through a valved peel-away sheath. Position was confirmed with fluoroscopy. Images were obtained for documentation. The patient tolerated the procedure well. No immediate complications. Incisions were closed in a two layer fashion with 4 - 0 Vicryl suture. Dermabond was applied to the skin. The port catheter was accessed, blood was aspirated followed by saline and heparin flushes. Needle was removed. A dry sterile dressing was applied. IMPRESSION: Ultrasound and fluoroscopically guided right internal jugular single lumen power port catheter insertion. Tip in the SVC/RA junction. Catheter ready for use. Electronically Signed   By: Jerilynn Mages.  Shick M.D.   On: 12/08/2018 15:23    IMPRESSION:  1. Essential hypertension   2. Coronary artery disease due to lipid rich plaque   3. S/P CABG (coronary artery bypass graft)   4. Ischemic cardiomyopathy   5. Metastatic adenocarcinoma [facility])     ASSESSMENT AND  PLAN: Mrs. Crissa Sowder is a 78 year old African-American female who suffered a NSTEMI in October 2015 which was felt to be due to subtotal high-grade calcified RCA with severe diffuse distal disease beyond her subtotal stenosis.  She also has a severely diffusely diseased mid distal LAD which is not amenable for revascularization.  At the time of her intervention, the smallest balloon catheter that was available in the catheterization laboratory was a 1.20 mm and this was unable to cross  the stenosis.  She has been on medical therapy but in October developed recurrent angina consistent with unstable symptoms necessitating repeat catheterization.  She had severely calcified CAD  and unsuccessful CABG revascularization surgery.  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  I reviewed her echo Doppler study which was done prior to initiating chemotherapy.  This showed an EF of 40 to 45%.  His residual akinesis of the mid apical anteroseptal, inferoseptal, and apical myocardium with anterior hypokinesis.  She had grade 1 diastolic dysfunction and abnormal tissue Doppler consistent with high ventricular filling pressures.  Systolic peak pressure was elevated at 38 mm.  Global strain was abnormal.  She initiated chemotherapy on December 27 and tolerated her initial treatment fairly well.  She will be evaluated by Dr. Annamaria Boots at the end of the week for reassessment.  Clinically she has been without recurrent anginal symptomatology.  Her shortness of breath has improved.  She is now ambulating at home without difficulty.  Her ECG today remains stable.  I will see her in 3 months for reevaluation or sooner if problems arise.  Time spent: 25 minutes Troy Sine, MD, [facility]  12/27/2018 7:18 PM

## 2018-12-25 NOTE — Patient Instructions (Signed)
Medication Instructions:  The current medical regimen is effective;  continue present plan and medications.  If you need a refill on your cardiac medications before your next appointment, please call your pharmacy.   Follow-Up: At [facility], you and your health needs are our priority.  As part of our continuing mission to provide you with exceptional heart care, we have created designated Provider Care Teams.  These Care Teams include your primary Cardiologist (physician) and Advanced Practice Providers (APPs -  Physician Assistants and Nurse Practitioners) who all work together to provide you with the care you need, when you need it. You will need a follow up appointment in 3 months.  You may see Thomas Kelly, MD or one of the following Advanced Practice Providers on your designated Care Team: Hao Meng, PA-C . Angela Duke, PA-C    

## 2018-12-27 ENCOUNTER — Encounter: Payer: Self-pay | Admitting: Cardiovascular Disease

## 2018-12-27 NOTE — Progress Notes (Signed)
[place]   [phone] [phone]   Clinic Follow up Note   Patient Care Team: Tanda Rockers, MD as PCP - General (Pulmonary Disease) Troy Sine, MD as PCP - Cardiology (Cardiology)  Date of Service:  12/29/2018  CHIEF COMPLAINT: F/u on metastatic gallbladder cancer  SUMMARY OF ONCOLOGIC HISTORY: Oncology History   Cancer Staging Gallbladder cancer [facility]) Staging form: Gallbladder, AJCC 8th Edition - Clinical stage from 11/13/2018: Stage IVB (cTX, cN2, pM1) - Signed by Truitt Merle, MD on 12/15/2018       Gallbladder cancer ([place])   10/04/2018 Pathology Results    10/04/2018 Surgical Pathology Diagnosis 1. Lymph node, biopsy, mediastinal - LYMPH NODE WITH METASTATIC ADENOCARCINOMA. - SEE MICROSCOPIC DESCRIPTION 2. Plaque, coronary artery - CALCIFIED ATHEROSCLEROTIC PLAQUE.    11/13/2018 Cancer Staging    Staging form: Gallbladder, AJCC 8th Edition - Clinical stage from 11/13/2018: Stage IVB (cTX, cN2, pM1) - Signed by Truitt Merle, MD on 12/15/2018    11/17/2018 Imaging    11/17/2018 CT CAP IMPRESSION: 1. 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 This is associated with extensive upper abdominal and retroperitoneal lymphadenopathy, as well as metastatic lymphadenopathy in the posterior mediastinum and left supraclavicular region. There is also a metastatic lesion to the left ischium. 2. 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 3. Aortic atherosclerosis, in addition to left main and 3 vessel coronary artery disease. Status post median sternotomy for CABG including LIMA to the LAD. 4. There are calcifications of the aortic valve. Echocardiographic correlation for evaluation of potential valvular dysfunction may be warranted if clinically indicated.     11/21/2018 Initial Diagnosis    Gallbladder cancer ([place])    11/27/2018 Pathology Results    11/27/2018 CA19-9  immunohistochemical stain Per request, a CA19-9 immunohistochemical stain was performed at an outside institution revealing positive staining in the tumor cells.     12/15/2018 -  Chemotherapy     first line chemo cisplatin and gemcitabine every 2 weeks on 12/15/18      CURRENT THERAPY:   first line chemo cisplatin and gemcitabine every 2 weeks on 12/15/18  INTERVAL HISTORY:  Cassie [facility] is here for a follow up and cycle 2 treatment. She presents to the clinic today with her family member. She notes after cycle 1 she was only nauseous for a few days. She also notes 1 episode of mild epistaxis which stopped after pressure was applied. She notes only 3 episodes of diarrhea in 2 weeks.  She notes she can tolerate 1 tab of oral potassium but with 2 tabs she get significant headaches.     REVIEW OF SYSTEMS:   Constitutional: Denies fevers, chills or abnormal weight loss (+) occasional headaches  Eyes: Denies blurriness of vision Ears, nose, mouth, throat, and face: Denies mucositis or sore throat Respiratory: Denies cough, dyspnea or wheezes Cardiovascular: Denies palpitation, chest discomfort or lower extremity swelling Gastrointestinal:  Denies nausea, heartburn or change in bowel habits Skin: Denies abnormal skin rashes Lymphatics: Denies new lymphadenopathy or easy bruising Neurological:Denies numbness, tingling or new weaknesses Behavioral/Psych: Mood is stable, no new changes  All other systems were reviewed with the patient and are negative.  MEDICAL HISTORY:  Past Medical History:  Diagnosis Date  . Allergic rhinitis   . Arthritis   . Asthma    xolair s 8/05 ?11/07; mastered hfa 12/20/08  . Benign positional vertigo   . CAD (coronary artery disease)  a. 09/2014 NSTEMI s/p LHC with sig 2V dz. dLAD diffusely diseased and not suitable for PCI. unsuccessful RCA PCI d/t heavy calcifications  . GERD (gastroesophageal reflux disease)   . Heart attack ([place])    09/2014  .  Hyperlipidemia    <130 ldl pos fm hx, bp  . Hypertension   . Osteopenia    dexa 08/22/07 AP spine + 1.1, left femur -1.3, right femur -.8; dexa 10/06/09 +1.6, left femur =1.6, right femur -.  . PONV (postoperative nausea and vomiting)   . Ruptured disc, cervical   . 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 BREAST SURGERY  10-26-10   Rt. breast bx--for microcalcifications in rt. retroareolar region--dx was hyalinized fibroadenoma  . BUNIONECTOMY Bilateral   . CARDIAC CATHETERIZATION     2015  . CATARACT EXTRACTION Right 02/02/2018   Dr Satira Sark  . CATARACT EXTRACTION Left 03/30/2018  . [place] SURGERY  12/01  . CORONARY ARTERY BYPASS GRAFT N/A 10/04/2018   Procedure: CORONARY ARTERY BYPASS GRAFTING (CABG) times 2 using left  Internal mammary artery to LAD, left greater saphenous vein - open harvest.;  Surgeon: [name], MD;  Location: [facility];  Service: Open Heart Surgery;  Laterality: N/A;  . IR IMAGING GUIDED PORT INSERTION  12/08/2018  . LEFT HEART CATH AND CORONARY ANGIOGRAPHY N/A 09/27/2018   Procedure: LEFT HEART CATH AND CORONARY ANGIOGRAPHY;  Surgeon: Troy Sine, MD;  Location: [facility];  Service: Cardiovascular;  Laterality: N/A;  . LEFT HEART CATHETERIZATION WITH CORONARY ANGIOGRAM N/A 10/16/2014   Procedure: LEFT HEART CATHETERIZATION WITH CORONARY ANGIOGRAM;  Surgeon: Blane Ohara, MD;  Location: [facility] C[facility] LAB;  Service: Cardiovascular;  Laterality: N/A;  . Left L4-5 transforaminal lumbar interbody fusion with Depuy cage, rods and screws, local and allograft bone graft, Vivigen; bilateral decompression/partial hemilaminectomy lumbar five-sacral one  07/2017  . PERCUTANEOUS CORONARY ROTOBLATOR INTERVENTION (PCI-R) N/A 10/17/2014   Procedure: PERCUTANEOUS CORONARY ROTOBLATOR INTERVENTION (PCI-R);  Surgeon: Troy Sine, MD;  Location[facility] C[facility] LAB;   Service: Cardiovascular;  Laterality: N/A;  . TEE WITHOUT CARDIOVERSION N/A 10/04/2018   Procedure: TRANSESOPHAGEAL ECHOCARDIOGRAM (TEE);  Surgeon: [name], MD;  Location: [facility];  Service: Open Heart Surgery;  Laterality: N/A;  . VEIN LIGATION AND STRIPPING Left   . VIDEO BRONCHOSCOPY Bilateral 02/05/2015   Procedure: VIDEO BRONCHOSCOPY WITHOUT FLUORO;  Surgeon: Tanda Rockers, MD;  Location: [facility] ENDOS[facility];  Service: Endoscopy;  Laterality: Bilateral;  . VULVA /PERINEUM BIOPSY  12-30-10   --epidermoid cyst    I have reviewed the social history and family history with the patient and they are unchanged from previous note.  ALLERGIES:  is allergic to fish-derived products; penicillins; aspirin; brilinta [ticagrelor]; codeine phosphate; and diphenhydramine hcl.  MEDICATIONS:  Current Outpatient Medications  Medication Sig Dispense Refill  . albuterol (PROAIR HFA) 108 (90 Base) MCG/ACT inhaler INHALE 1 TO 2 PUFFS BY MOUTH EVERY 4 HOURS AS NEEDED FOR WHEEZING 8.5 g 3  . atorvastatin (LIPITOR) 80 MG tablet TAKE 1 TABLET(80 MG) BY MOUTH DAILY 90 tablet 1  . bisoprolol (ZEBETA) 10 MG tablet Take 1 tablet (10 mg total) by mouth daily. 90 tablet 1  . celecoxib (CELEBREX) 200 MG capsule Take 1 capsule (200 mg total) by mouth 2 (two) times daily. 60 capsule 1  . cholecalciferol 2000 units TABS Take 1 tablet (2,000 Units total) by  mouth daily. 30 tablet 3  . clopidogrel (PLAVIX) 75 MG tablet Take 1 tablet (75 mg total) by mouth daily. 90 tablet 3  . clotrimazole-betamethasone (LOTRISONE) cream Apply 1 application topically as needed (itching). Use as directed for rash 30 g 2  . dextromethorphan-guaiFENesin (MUCINEX DM) 30-600 MG per 12 hr tablet Take 1-2 tablets by mouth every 12 (twelve) hours as needed for cough (with flutter).     . furosemide (LASIX) 20 MG tablet Take 2 tablets (40 mg total) by mouth daily. 90 tablet 3  . HYDROcodone-acetaminophen (NORCO/VICODIN) 5-325 MG tablet Take 1  tablet by mouth every 6 (six) hours as needed for moderate pain. 30 tablet 0  . lidocaine-prilocaine (EMLA) cream Apply to affected area once 30 g 3  . magnesium oxide (MAG-OX) 400 (241.3 Mg) MG tablet Take 1 tablet (400 mg total) by mouth daily. 30 tablet 1  . meclizine (ANTIVERT) 25 MG tablet TAKE 2 TABLETS BY MOUTH THREE TIMES DAILY AS NEEDED 24 tablet 2  . montelukast (SINGULAIR) 10 MG tablet TAKE 1 TABLET(10 MG) BY MOUTH AT BEDTIME 90 tablet 0  . Multiple Vitamin (MULTIVITAMIN) capsule Take 1 capsule by mouth daily.     . ondansetron (ZOFRAN) 8 MG tablet Take 1 tablet (8 mg total) by mouth 2 (two) times daily as needed. Start on the third day after chemotherapy. 30 tablet 1  . oxyCODONE (OXY IR/ROXICODONE) 5 MG immediate release tablet Take 1 tablet (5 mg total) by mouth every 6 (six) hours as needed for severe pain. 25 tablet 0  . potassium chloride SA (K-DUR,KLOR-CON) 20 MEQ tablet Take 1 tablet (20 mEq total) by mouth 2 (two) times daily. 60 tablet 2  . prochlorperazine (COMPAZINE) 10 MG tablet Take 1 tablet (10 mg total) by mouth every 6 (six) hours as needed (Nausea or vomiting). 30 tablet 1  . SYMBICORT 160-4.5 MCG/ACT inhaler INHALE 2 PUFFS BY MOUTH EVERY 12 HOURS 10.2 g 11   No current facility-administered medications for this visit.    Facility-Administered Medications Ordered in Other Visits  Medication Dose Route Frequency Provider Last Rate Last Dose  . heparin lock flush 100 unit/mL  500 Units Intracatheter Once PRN Truitt Merle, MD      . sodium chloride flush (NS) 0.9 % injection 10 mL  10 mL Intracatheter PRN Truitt Merle, MD        PHYSICAL EXAMINATION: ECOG PERFORMANCE STATUS: 1 - Symptomatic but completely ambulatory  Vitals:   12/29/18 0917  BP: 110/71  Pulse: 82  Resp: 18  Temp: 97.6 F (36.4 C)  SpO2: 100%   Filed Weights   12/29/18 0917  Weight: 117 lb 6.4 oz (53.3 kg)    GENERAL:alert, no distress and comfortable SKIN: skin color, texture, turgor are  normal, no rashes or significant lesions EYES: normal, Conjunctiva are pink and non-injected, sclera clear OROPHARYNX:no exudate, no erythema and lips, buccal mucosa, and tongue normal  NECK: supple, thyroid normal size, non-tender, without nodularity LYMPH:  no palpable lymphadenopathy in the cervical, axillary or inguinal LUNGS: clear to auscultation and percussion with normal breathing effort HEART: regular rate & rhythm and no murmurs and no lower extremity edema ABDOMEN:abdomen soft, non-tender and normal bowel sounds Musculoskeletal:no cyanosis of digits and no clubbing  NEURO: alert & oriented x 3 with fluent speech, no focal motor/sensory deficits  LABORATORY DATA:  I have reviewed the data as listed CBC Latest Ref Rng & Units 12/29/2018 12/14/2018 12/08/2018  WBC 4.0 - 10.5 K/uL 6.6 7.6 9.6  Hemoglobin 12.0 - 15.0 g/dL 10.1(L) 9.0(L) 10.1(L)  Hematocrit 36.0 - 46.0 % 32.5(L) 28.7(L) 34.0(L)  Platelets 150 - 400 K/uL 497(H) 508(H) 580(H)     CMP Latest Ref Rng & Units 12/29/2018 12/15/2018 12/14/2018  Glucose 70 - 99 mg/dL 103(H) 102(H) 114(H)  BUN 8 - 23 mg/dL 8 12 11   Creatinine 0.44 - 1.00 mg/dL 0.69 0.69 0.68  Sodium 135 - 145 mmol/L 138 140 140  Potassium 3.5 - 5.1 mmol/L 3.9 3.3(L) 2.5(LL)  Chloride 98 - 111 mmol/L 101 101 100  CO2 22 - 32 mmol/L 26 29 31   Calcium 8.9 - 10.3 mg/dL 9.5 8.8(L) 8.9  Total Protein 6.5 - 8.1 g/dL 8.5(H) - 7.8  Total Bilirubin 0.3 - 1.2 mg/dL 0.5 - 0.7  Alkaline Phos 38 - 126 U/L 124 - 110  AST 15 - 41 U/L 43(H) - 30  ALT 0 - 44 U/L 31 - 29      RADIOGRAPHIC STUDIES: I have personally reviewed the radiological images as listed and agreed with the findings in the report. No results found.   ASSESSMENT & PLAN:  KENDALL ARNELL is a 78 y.o. 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  -Her initial PET scan showed hypermetabolic gallbladder mass, with extensive lymph nodes metastasis and left pelvic bone lesion.   -Given metastatic disease, her cancer is not curable but still treatable.  -She started first line chemo cisplatin and gemcitabine every 2 weeks on 12/15/18.  -She tolerated first cycle moderately well with mild diarrhea and nausea and 1 episode of epistaxis. Will continue treatment as long as she can tolerate or until disease progression.  -Will repeat scan after 3 months of treatment.  -Labs reviewed, Hg at 10.1, PLT at 497K. CMP is still pending.  -wil consider zometa every 3 months for her bone met -F/u in 2 weeks    2. CAD, S/P CABGX2 on 09/27/2018, EF 40-45%  -Currently on Plavix. -f/u with Dr. Claiborne Billings -she receives lasix during her chemo   3. Asthma -Currently on Symbicort, Singulair, and albuterol as needed -f/u with pulmonary -Stable   4.Goal of care discussion -The patient understands the goal of care is palliative. -she is full code for now   5. Social support  -She is widowed, lives alone, has 1 daughter who lives in [place], but not very close, and "not reliable" per pt -I previously referred to SW  6. Sever hypokalemia  -secondary to lasix  -On KCL 91mq twice daily. Will BID she develops significant headaches. Her KCL is 3.9 today, will reduce her KCL to once daily   7. Mild Anemia  -Secondary to chemo  -Hg at 10.1 today (12/29/2018) -I recommend her to start oral iron ferrous sulfate with Orange juice.  -Will monitor   8. Weight loss  -She loss about 35 pounds after heart surgery, stable lately.  -I dicussed increasing calorie intake in diet and with nutritional supplements or protein supplement.  -I will refer her to dietician  -I discussed the option of appetite stimulant, she would like to work on her diet first.   Plan  -Labs reviewed and adequate to proceed with cycle 2 Cisplatin/Gemcitabine  today and continue every 2 weeks -Lab, flush, f/u and Cis/gem in 2 weeks  -will discuss Zometa with her on next visit    No  problem-specific Assessment & Plan notes found for this encounter.   No orders of the defined types were placed in this encounter.  All questions were answered. The patient knows to call the clinic with any problems, questions or concerns. No barriers to learning was detected. I spent 20 minutes counseling the patient face to face. The total time spent in the appointment was 25 minutes and more than 50% was on counseling and review of test results     Truitt Merle, MD 12/29/2018   I, Joslyn Devon, am acting as scribe for Truitt Merle, MD.   I have reviewed the above documentation for accuracy and completeness, and I agree with the above.

## 2018-12-29 ENCOUNTER — Inpatient Hospital Stay (HOSPITAL_BASED_OUTPATIENT_CLINIC_OR_DEPARTMENT_OTHER): Payer: Medicare Other | Admitting: Hematology

## 2018-12-29 ENCOUNTER — Telehealth: Payer: Self-pay

## 2018-12-29 ENCOUNTER — Inpatient Hospital Stay: Payer: Medicare Other | Attending: Obstetrics

## 2018-12-29 ENCOUNTER — Inpatient Hospital Stay: Payer: Medicare Other

## 2018-12-29 ENCOUNTER — Encounter: Payer: Self-pay | Admitting: Hematology

## 2018-12-29 VITALS — BP 110/71 | HR 82 | Temp 97.6°F | Resp 18 | Ht 59.0 in | Wt 117.4 lb

## 2018-12-29 DIAGNOSIS — J45909 Unspecified asthma, uncomplicated: Secondary | ICD-10-CM | POA: Insufficient documentation

## 2018-12-29 DIAGNOSIS — E876 Hypokalemia: Secondary | ICD-10-CM | POA: Insufficient documentation

## 2018-12-29 DIAGNOSIS — Z7189 Other specified counseling: Secondary | ICD-10-CM

## 2018-12-29 DIAGNOSIS — Z452 Encounter for adjustment and management of vascular access device: Secondary | ICD-10-CM | POA: Insufficient documentation

## 2018-12-29 DIAGNOSIS — Z5111 Encounter for antineoplastic chemotherapy: Secondary | ICD-10-CM | POA: Diagnosis present

## 2018-12-29 DIAGNOSIS — D649 Anemia, unspecified: Secondary | ICD-10-CM

## 2018-12-29 DIAGNOSIS — C7951 Secondary malignant neoplasm of bone: Secondary | ICD-10-CM | POA: Insufficient documentation

## 2018-12-29 DIAGNOSIS — C23 Malignant neoplasm of gallbladder: Secondary | ICD-10-CM

## 2018-12-29 DIAGNOSIS — I2510 Atherosclerotic heart disease of native coronary artery without angina pectoris: Secondary | ICD-10-CM

## 2018-12-29 DIAGNOSIS — R197 Diarrhea, unspecified: Secondary | ICD-10-CM | POA: Insufficient documentation

## 2018-12-29 DIAGNOSIS — C771 Secondary and unspecified malignant neoplasm of intrathoracic lymph nodes: Secondary | ICD-10-CM | POA: Insufficient documentation

## 2018-12-29 DIAGNOSIS — Z95828 Presence of other vascular implants and grafts: Secondary | ICD-10-CM

## 2018-12-29 LAB — CMP (CANCER CENTER ONLY)
ALT: 31 U/L (ref 0–44)
AST: 43 U/L — ABNORMAL HIGH (ref 15–41)
Albumin: 3.1 g/dL — ABNORMAL LOW (ref 3.5–5.0)
Alkaline Phosphatase: 124 U/L (ref 38–126)
Anion gap: 11 (ref 5–15)
BUN: 8 mg/dL (ref 8–23)
CO2: 26 mmol/L (ref 22–32)
Calcium: 9.5 mg/dL (ref 8.9–10.3)
Chloride: 101 mmol/L (ref 98–111)
Creatinine: 0.69 mg/dL (ref 0.44–1.00)
GFR, Est AFR Am: >60 mL/min (ref >60)
GFR, Estimated: >60 mL/min (ref >60)
Glucose, Bld: 103 mg/dL — ABNORMAL HIGH (ref 70–99)
Potassium: 3.9 mmol/L (ref 3.5–5.1)
Sodium: 138 mmol/L (ref 135–145)
Total Bilirubin: 0.5 mg/dL (ref 0.3–1.2)
Total Protein: 8.5 g/dL — ABNORMAL HIGH (ref 6.5–8.1)

## 2018-12-29 LAB — CBC WITH DIFFERENTIAL (CANCER CENTER ONLY)
ABS IMMATURE GRANULOCYTES: 0.02 10*3/uL (ref 0.00–0.07)
Basophils Absolute: 0 10*3/uL (ref 0.0–0.1)
Basophils Relative: 1 %
Eosinophils Absolute: 0.1 10*3/uL (ref 0.0–0.5)
Eosinophils Relative: 1 %
HCT: 32.5 % — ABNORMAL LOW (ref 36.0–46.0)
Hemoglobin: 10.1 g/dL — ABNORMAL LOW (ref 12.0–15.0)
Immature Granulocytes: 0 %
Lymphocytes Relative: 20 %
Lymphs Abs: 1.3 10*3/uL (ref 0.7–4.0)
MCH: 26.2 pg (ref 26.0–34.0)
MCHC: 31.1 g/dL (ref 30.0–36.0)
MCV: 84.2 fL (ref 80.0–100.0)
MONO ABS: 0.5 10*3/uL (ref 0.1–1.0)
Monocytes Relative: 7 %
NEUTROS ABS: 4.7 10*3/uL (ref 1.7–7.7)
Neutrophils Relative %: 71 %
Platelet Count: 497 10*3/uL — ABNORMAL HIGH (ref 150–400)
RBC: 3.86 MIL/uL — ABNORMAL LOW (ref 3.87–5.11)
RDW: 16.2 % — ABNORMAL HIGH (ref 11.5–15.5)
WBC Count: 6.6 10*3/uL (ref 4.0–10.5)
nRBC: 0 % (ref 0.0–0.2)

## 2018-12-29 MED ORDER — PALONOSETRON HCL INJECTION 0.25 MG/5ML
INTRAVENOUS | Status: AC
  Filled 2018-12-29: qty 5

## 2018-12-29 MED ORDER — SODIUM CHLORIDE 0.9 % IV SOLN
1000.0000 mg/m2 | Freq: Once | INTRAVENOUS | Status: AC
  Administered 2018-12-29: 1520 mg via INTRAVENOUS
  Filled 2018-12-29: qty 39.98

## 2018-12-29 MED ORDER — HEPARIN SOD (PORK) LOCK FLUSH 100 UNIT/ML IV SOLN
500.0000 [IU] | Freq: Once | INTRAVENOUS | Status: AC | PRN
  Administered 2018-12-29: 500 [IU]
  Filled 2018-12-29: qty 5

## 2018-12-29 MED ORDER — PALONOSETRON HCL INJECTION 0.25 MG/5ML
0.2500 mg | Freq: Once | INTRAVENOUS | Status: AC
  Administered 2018-12-29: 0.25 mg via INTRAVENOUS

## 2018-12-29 MED ORDER — SODIUM CHLORIDE 0.9% FLUSH
10.0000 mL | INTRAVENOUS | Status: DC | PRN
  Administered 2018-12-29: 10 mL
  Filled 2018-12-29: qty 10

## 2018-12-29 MED ORDER — SODIUM CHLORIDE 0.9% FLUSH
10.0000 mL | INTRAVENOUS | Status: DC | PRN
  Administered 2018-12-29: 10 mL via INTRAVENOUS
  Filled 2018-12-29: qty 10

## 2018-12-29 MED ORDER — FUROSEMIDE 10 MG/ML IJ SOLN
20.0000 mg | Freq: Once | INTRAMUSCULAR | Status: AC
  Administered 2018-12-29: 20 mg via INTRAVENOUS

## 2018-12-29 MED ORDER — FUROSEMIDE 10 MG/ML IJ SOLN
INTRAMUSCULAR | Status: AC
  Filled 2018-12-29: qty 2

## 2018-12-29 MED ORDER — FUROSEMIDE 10 MG/ML IJ SOLN: 20.0000 mg | Freq: Once | INTRAMUSCULAR | Status: DC

## 2018-12-29 MED ORDER — POTASSIUM CHLORIDE 2 MEQ/ML IV SOLN
Freq: Once | INTRAVENOUS | Status: AC
  Administered 2018-12-29: 11:00:00 via INTRAVENOUS
  Filled 2018-12-29: qty 10

## 2018-12-29 MED ORDER — SODIUM CHLORIDE 0.9 % IV SOLN
Freq: Once | INTRAVENOUS | Status: AC
  Administered 2018-12-29: 10:00:00 via INTRAVENOUS
  Filled 2018-12-29: qty 250

## 2018-12-29 MED ORDER — SODIUM CHLORIDE 0.9 % IV SOLN
25.0000 mg/m2 | Freq: Once | INTRAVENOUS | Status: AC
  Administered 2018-12-29: 38 mg via INTRAVENOUS
  Filled 2018-12-29: qty 38

## 2018-12-29 MED ORDER — SODIUM CHLORIDE 0.9 % IV SOLN
Freq: Once | INTRAVENOUS | Status: AC
  Administered 2018-12-29: 13:00:00 via INTRAVENOUS
  Filled 2018-12-29: qty 5

## 2018-12-29 NOTE — Progress Notes (Signed)
  Oncology Nurse Navigator Documentation     Met with patient in infusion room to assess for navigation needs. Patient reports treatments are going well and she now knows what to expect on the days surrounding tx. Patient is concerned about weight loss. Referral to nutrition placed and message sent to Dory Peru RD requesting that patient be seen on a chemo day to avoid additional appointment trip. Patient verbalized that she can call with questions or concerns.

## 2018-12-29 NOTE — Telephone Encounter (Signed)
Spoke with [employer]. And stated patient is already sch. For 2 appointments. Per 1/10 no los

## 2018-12-29 NOTE — Patient Instructions (Signed)
[facility] Discharge Instructions for Patients Receiving Chemotherapy  Today you received the following chemotherapy agents :  Gemcitabine,  Cisplatin.  To help prevent nausea and vomiting after your treatment, we encourage you to take your nausea medication as prescribed.   If you develop nausea and vomiting that is not controlled by your nausea medication, call the clinic.   BELOW ARE SYMPTOMS THAT SHOULD BE REPORTED IMMEDIATELY:  *FEVER GREATER THAN 100.5 F  *CHILLS WITH OR WITHOUT FEVER  NAUSEA AND VOMITING THAT IS NOT CONTROLLED WITH YOUR NAUSEA MEDICATION  *UNUSUAL SHORTNESS OF BREATH  *UNUSUAL BRUISING OR BLEEDING  TENDERNESS IN MOUTH AND THROAT WITH OR WITHOUT PRESENCE OF ULCERS  *URINARY PROBLEMS  *BOWEL PROBLEMS  UNUSUAL RASH Items with * indicate a potential emergency and should be followed up as soon as possible.  Feel free to call the clinic should you have any questions or concerns. The clinic phone number is [phone].  Please show the CHEMO ALERT CARD at check-in to the Emergency Department and triage nurse.   

## 2018-12-30 ENCOUNTER — Telehealth: Payer: Self-pay | Admitting: *Deleted

## 2018-12-30 NOTE — Telephone Encounter (Signed)
Telephone call to patient to advise per Dr. Burr Medico patient should only take potassium once a day until she is rechecked. Patient verbalized an understanding.

## 2019-01-05 NOTE — Progress Notes (Signed)
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 Lymph node, biopsy, mediastinal - LYMPH NODE WITH METASTATIC ADENOCARCINOMA. - SEE MICROSCOPIC DESCRIPTION 2. Plaque, coronary artery - CALCIFIED ATHEROSCLEROTIC PLAQUE.    10/14/2018 Miscellaneous    Foundation One:  MSI stable tumor mutation burden 3Muts/mb ERBB2 amplification CCNE1 amplification TP53 mutation(+) CDK6 amplification  HGF amplification     11/13/2018 Cancer Staging    Staging form: Gallbladder, AJCC 8th Edition - Clinical stage from 11/13/2018: Stage IVB (cTX, cN2, pM1) - Signed by Truitt Merle, MD on 12/15/2018    11/17/2018 Imaging    11/17/2018 CT CAP IMPRESSION: 1. 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 This is associated with extensive upper abdominal and retroperitoneal lymphadenopathy, as well as metastatic lymphadenopathy in the posterior mediastinum and left supraclavicular region. There is also a metastatic lesion to the left ischium. 2. 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 3. Aortic atherosclerosis, in addition to left main and 3 vessel coronary artery disease. Status post median sternotomy for CABG including LIMA to the LAD. 4. There are calcifications of the aortic valve. Echocardiographic correlation for evaluation of potential  valvular dysfunction may be warranted if clinically indicated.     11/21/2018 Initial Diagnosis    Gallbladder cancer ([place])    11/27/2018 Pathology Results    11/27/2018 CA19-9 immunohistochemical stain Per request, a CA19-9 immunohistochemical stain was performed at an outside institution revealing positive staining in the tumor cells.     12/15/2018 -  Chemotherapy     first line chemo cisplatin and gemcitabine every 2 weeks on 12/15/18     CURRENT THERAPY first linechemo cisplatin and gemcitabineevery 2 weeks on 12/15/18   INTERVAL HISTORY: Cassie Campbell is a 78 y.o. female who is here for follow-up. Today, she is here with her family member. She is doing well  She takes nausea medications as needed and her eating have improved. She denies fever or abdominal pain. She also denies bleeding.   Pertinent positives and negatives of review of systems are listed and detailed within the above HPI.  REVIEW OF SYSTEMS:   Constitutional: Denies fevers, chills or abnormal weight loss (+) eating better Eyes: Denies blurriness of vision Ears, nose, mouth, throat, and face: Denies mucositis or sore throat Respiratory: Denies cough, dyspnea or wheezes Cardiovascular: Denies palpitation, chest discomfort or lower extremity swelling Gastrointestinal:  Denies nausea, heartburn or change in bowel habits Skin: Denies abnormal skin rashes Lymphatics: Denies new lymphadenopathy or easy bruising Neurological:Denies numbness, tingling or new weaknesses Behavioral/Psych: Mood is stable, no new changes  All other systems were reviewed with the patient and are negative.  MEDICAL HISTORY:  Past Medical History:  Diagnosis Date  . Allergic rhinitis   . Arthritis   . Asthma    xolair s 8/05 ?11/07; mastered hfa 12/20/08  . Benign positional vertigo   . CAD (coronary artery disease)  a. 09/2014 NSTEMI s/p LHC with sig 2V dz. dLAD diffusely diseased and not suitable for PCI. unsuccessful RCA  PCI d/t heavy calcifications  . GERD (gastroesophageal reflux disease)   . Heart attack ([place])    09/2014  . Hyperlipidemia    <130 ldl pos fm hx, bp  . Hypertension   . Osteopenia    dexa 08/22/07 AP spine + 1.1, left femur -1.3, right femur -.8; dexa 10/06/09 +1.6, left femur =1.6, right femur -.  . PONV (postoperative nausea and vomiting)   . Ruptured disc, cervical   . 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 BREAST SURGERY  10-26-10   Rt. breast bx--for microcalcifications in rt. retroareolar region--dx was hyalinized fibroadenoma  . BUNIONECTOMY Bilateral   . CARDIAC CATHETERIZATION     2015  . CATARACT EXTRACTION Right 02/02/2018   Dr Satira Sark  . CATARACT EXTRACTION Left 03/30/2018  . [place] SURGERY  12/01  . CORONARY ARTERY BYPASS GRAFT N/A 10/04/2018   Procedure: CORONARY ARTERY BYPASS GRAFTING (CABG) times 2 using left  Internal mammary artery to LAD, left greater saphenous vein - open harvest.;  Surgeon: [name], MD;  Location: [facility];  Service: Open Heart Surgery;  Laterality: N/A;  . IR IMAGING GUIDED PORT INSERTION  12/08/2018  . LEFT HEART CATH AND CORONARY ANGIOGRAPHY N/A 09/27/2018   Procedure: LEFT HEART CATH AND CORONARY ANGIOGRAPHY;  Surgeon: Troy Sine, MD;  Location: [place] CV LAB;  Service: Cardiovascular;  Laterality: N/A;  . LEFT HEART CATHETERIZATION WITH CORONARY ANGIOGRAM N/A 10/16/2014   Procedure: LEFT HEART CATHETERIZATION WITH CORONARY ANGIOGRAM;  Surgeon: Blane Ohara, MD;  Location[facility] CATH LAB;  Service: Cardiovascular;  Laterality: N/A;  . Left L4-5 transforaminal lumbar interbody fusion with Depuy cage, rods and screws, local and allograft bone graft, Vivigen; bilateral decompression/partial hemilaminectomy lumbar five-sacral one  07/2017  . PERCUTANEOUS CORONARY ROTOBLATOR INTERVENTION (PCI-R) N/A 10/17/2014   Procedure:  PERCUTANEOUS CORONARY ROTOBLATOR INTERVENTION (PCI-R);  Surgeon: Troy Sine, MD;  Location: [facility] CATH LAB;  Service: Cardiovascular;  Laterality: N/A;  . TEE WITHOUT CARDIOVERSION N/A 10/04/2018   Procedure: TRANSESOPHAGEAL ECHOCARDIOGRAM (TEE);  Surgeon: [name], MD;  Location: [place];  Service: Open Heart Surgery;  Laterality: N/A;  . VEIN LIGATION AND STRIPPING Left   . VIDEO BRONCHOSCOPY Bilateral 02/05/2015   Procedure: VIDEO BRONCHOSCOPY WITHOUT FLUORO;  Surgeon: Tanda Rockers, MD;  Location: [facility] ENDOSCOPY;  Service: Endoscopy;  Laterality: Bilateral;  . VULVA /PERINEUM BIOPSY  12-30-10   --epidermoid cyst    I have reviewed the social history and family history with the patient and they are unchanged from previous note.  ALLERGIES:  is allergic to fish-derived products; penicillins; aspirin; brilinta [ticagrelor]; codeine phosphate; and diphenhydramine hcl.  MEDICATIONS:  Current Outpatient Medications  Medication Sig Dispense Refill  . albuterol (PROAIR HFA) 108 (90 Base) MCG/ACT inhaler INHALE 1 TO 2 PUFFS BY MOUTH EVERY 4 HOURS AS NEEDED FOR WHEEZING 8.5 g 3  . atorvastatin (LIPITOR) 80 MG tablet TAKE 1 TABLET(80 MG) BY MOUTH DAILY 90 tablet 1  . bisoprolol (ZEBETA) 10 MG tablet Take 1 tablet (10 mg total) by mouth daily. 90 tablet 1  . celecoxib (CELEBREX) 200 MG capsule Take 1 capsule (200 mg total) by mouth 2 (two) times daily. 60 capsule 1  . cholecalciferol 2000 units TABS Take 1 tablet (2,000 Units total) by  mouth daily. 30 tablet 3  . clopidogrel (PLAVIX) 75 MG tablet Take 1 tablet (75 mg total) by mouth daily. 90 tablet 3  . clotrimazole-betamethasone (LOTRISONE) cream Apply 1 application topically as needed (itching). Use as directed for rash 30 g 2  . dextromethorphan-guaiFENesin (MUCINEX DM) 30-600 MG per 12 hr tablet Take 1-2 tablets by mouth every 12 (twelve) hours as needed for cough (with flutter).     . furosemide (LASIX) 20 MG tablet Take 2 tablets (40  mg total) by mouth daily. 90 tablet 3  . HYDROcodone-acetaminophen (NORCO/VICODIN) 5-325 MG tablet Take 1 tablet by mouth every 6 (six) hours as needed for moderate pain. 30 tablet 0  . lidocaine-prilocaine (EMLA) cream Apply to affected area once 30 g 3  . magnesium oxide (MAG-OX) 400 (241.3 Mg) MG tablet Take 1 tablet (400 mg total) by mouth daily. 30 tablet 1  . meclizine (ANTIVERT) 25 MG tablet TAKE 2 TABLETS BY MOUTH THREE TIMES DAILY AS NEEDED 24 tablet 2  . montelukast (SINGULAIR) 10 MG tablet TAKE 1 TABLET(10 MG) BY MOUTH AT BEDTIME 90 tablet 0  . Multiple Vitamin (MULTIVITAMIN) capsule Take 1 capsule by mouth daily.     . ondansetron (ZOFRAN) 8 MG tablet Take 1 tablet (8 mg total) by mouth 2 (two) times daily as needed. Start on the third day after chemotherapy. 30 tablet 1  . oxyCODONE (OXY IR/ROXICODONE) 5 MG immediate release tablet Take 1 tablet (5 mg total) by mouth every 6 (six) hours as needed for severe pain. 25 tablet 0  . potassium chloride SA (K-DUR,KLOR-CON) 20 MEQ tablet Take 1 tablet (20 mEq total) by mouth 2 (two) times daily. 60 tablet 2  . prochlorperazine (COMPAZINE) 10 MG tablet Take 1 tablet (10 mg total) by mouth every 6 (six) hours as needed (Nausea or vomiting). 30 tablet 1  . SYMBICORT 160-4.5 MCG/ACT inhaler INHALE 2 PUFFS BY MOUTH EVERY 12 HOURS 10.2 g 11   No current facility-administered medications for this visit.     PHYSICAL EXAMINATION: ECOG PERFORMANCE STATUS: 1 - Symptomatic but completely ambulatory  Vitals:   01/11/19 1343  BP: 114/71  Pulse: 92  Resp: 18  Temp: 97.6 F (36.4 C)  SpO2: 100%   Filed Weights   01/11/19 1343  Weight: 115 lb 11.2 oz (52.5 kg)    GENERAL:alert, no distress and comfortable SKIN: skin color, texture, turgor are normal, no rashes or significant lesions EYES: normal, Conjunctiva are pink and non-injected, sclera clear OROPHARYNX:no exudate, no erythema and lips, buccal mucosa, and tongue normal  NECK: supple,  thyroid normal size, non-tender, without nodularity LYMPH:  no palpable lymphadenopathy in the cervical, axillary or inguinal LUNGS: clear to auscultation and percussion with normal breathing effort HEART: regular rate & rhythm and no murmurs and no lower extremity edema ABDOMEN:abdomen soft, non-tender and normal bowel sounds Musculoskeletal:no cyanosis of digits and no clubbing  NEURO: alert & oriented x 3 with fluent speech, no focal motor/sensory deficits  LABORATORY DATA:  I have reviewed the data as listed CBC Latest Ref Rng & Units 01/11/2019 12/29/2018 12/14/2018  WBC 4.0 - 10.5 K/uL 6.4 6.6 7.6  Hemoglobin 12.0 - 15.0 g/dL 10.4(L) 10.1(L) 9.0(L)  Hematocrit 36.0 - 46.0 % 33.3(L) 32.5(L) 28.7(L)  Platelets 150 - 400 K/uL 339 497(H) 508(H)     CMP Latest Ref Rng & Units 01/11/2019 12/29/2018 12/15/2018  Glucose 70 - 99 mg/dL 131(H) 103(H) 102(H)  BUN 8 - 23 mg/dL _0 Creatinine 0.44 -  1.00 mg/dL 0.72 0.69 0.69  Sodium 135 - 145 mmol/L 140 138 140  Potassium 3.5 - 5.1 mmol/L 4.5 3.9 3.3(L)  Chloride 98 - 111 mmol/L 103 101 101  CO2 22 - 32 mmol/L _0 Calcium 8.9 - 10.3 mg/dL 9.7 9.5 8.8(L)  Total Protein 6.5 - 8.1 g/dL 8.5(H) 8.5(H) -  Total Bilirubin 0.3 - 1.2 mg/dL 0.3 0.5 -  Alkaline Phos 38 - 126 U/L 143(H) 124 -  AST 15 - 41 U/L 32 43(H) -  ALT 0 - 44 U/L 25 31 -      RADIOGRAPHIC STUDIES: I have personally reviewed the radiological images as listed and agreed with the findings in the report. No results found.   ASSESSMENT & PLAN:  ZYKIRIA BRUENING is a 78 y.o. 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 She was otherwise asymptomatic  -she has been on first line cisplatin and gemcitabineevery 2 weeks (due to her advanced age, and CHF) , started 12/15/2018. Tolerating well. -Labs reviewed, CBC showed Hg 10.4. CMP showed AKP 143.  Adequate for  treatment, will proceed cycle 3 today, and continue every 2 weeks -She is clinically stable and doing well. -Discussed her foundation 1 genomic testing results today, which showed MSI stable disease, low tumor mutation burden, no targetable mutation such as FGFR fusion or IDH mutation, but (+) HER2 amplification, may try Herceptin in the future. -plan to repeat staging scan after cycle 5 or 6  -will monitor CA19.9 every 4 weeks   2. CAD, S/P CABGX2 on 09/27/2018, EF 40-45% -Currently on Plavix. She also takes lasix during chemo. -f/u with Dr. Claiborne Billings  3. Asthma -Currently on Symbicort, Singulair, and albuterol as needed -f/u with pulmonary  4.Goal of care discussion -The patient understands the goal of care is palliative. -she is full code for now   5. Hhypokalemia  -Likely secondary to lasix. Currently on KCL 47mq once daily -Labs reviewed, CMP showed K 4.5  6. Mild Anemia  -Secondary to chemo and her recent open heart surgery  -Labs reviewed, CBC showed Hg 10.4, improved   7. Weight loss  -f/u with dietician  Plan  -Lab reviewed, adequate for treatment, will proceed cycle 3 cisplatin and carboplatin tomorrow, and continue every 2 weeks  -f/u in 2 weeks with labs  No problem-specific Assessment & Plan notes found for this encounter.   Orders Placed This Encounter  Procedures  . CA 19.9    Standing Status:   Standing    Number of Occurrences:   50    Standing Expiration Date:   01/12/2024   All questions were answered. The patient knows to call the clinic with any problems, questions or concerns. No barriers to learning was detected. I spent 20 minutes counseling the patient face to face. The total time spent in the appointment was 25 minutes and more than 50% was on counseling and review of test results  I, Noor Dweik am acting as scribe for Dr. YTruitt Merle  I have reviewed the above documentation for accuracy and completeness, and I agree with the above.     YTruitt Merle MD 01/11/2019

## 2019-01-09 ENCOUNTER — Encounter (HOSPITAL_COMMUNITY): Payer: Self-pay | Admitting: Hematology

## 2019-01-09 ENCOUNTER — Ambulatory Visit: Payer: Medicare Other | Admitting: Adult Health

## 2019-01-09 ENCOUNTER — Encounter: Payer: Self-pay | Admitting: Adult Health

## 2019-01-09 DIAGNOSIS — K219 Gastro-esophageal reflux disease without esophagitis: Secondary | ICD-10-CM

## 2019-01-09 DIAGNOSIS — I10 Essential (primary) hypertension: Secondary | ICD-10-CM | POA: Diagnosis not present

## 2019-01-09 DIAGNOSIS — J4540 Moderate persistent asthma, uncomplicated: Secondary | ICD-10-CM

## 2019-01-09 DIAGNOSIS — I2510 Atherosclerotic heart disease of native coronary artery without angina pectoris: Secondary | ICD-10-CM

## 2019-01-09 DIAGNOSIS — E785 Hyperlipidemia, unspecified: Secondary | ICD-10-CM

## 2019-01-09 DIAGNOSIS — C23 Malignant neoplasm of gallbladder: Secondary | ICD-10-CM

## 2019-01-09 NOTE — Patient Instructions (Signed)
Continue on current regimen .  Continue on low fat cholesterol diet  Low salt diet . High protein .  Follow up with Oncology as planned.  Follow med calendar closely and bring to each visit .  Follow up Dr. Melvyn Novas  In 3 months and As needed

## 2019-01-09 NOTE — Assessment & Plan Note (Signed)
Recently diagnosed metastatic gallbladder cancer.  Patient amazingly seems to be doing very well undergoing current treatment.  To continue follow-up with oncology.

## 2019-01-09 NOTE — Progress Notes (Signed)
@Patient  ID: Lucille Passy, female    DOB: 09/06/1941, 78 y.o.   MRN: [id number]  Chief Complaint  Patient presents with  . 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 Follow in pulmonary clinic also for primary care with hbp/ hyperlipidemia complicated by MI (CAD ) 10/15/14 and persistent mucus plug in RML/ RLL s/p fob 02/05/15 with marked improvement p lavage and no endobrchial lesions, just diffuse airway swelling .   TEST  BENIGN POSITIONAL VERTIGO (ICD-386.11)  OSTEOPENIA (ICD-733.90)  - DEXA 08/22/07 AP spine + 1.1, left femur -1.3, right femur -.8  - DEXA 10/06/09 + 1.6 left femur -1.6, right femur -.5  -DEXA 2014 no change  HYPERTENSION (ICD-401.9)  HYPERLIPIDEMIA (ICD-272.4)  - Target <100ldl pos fm hx, hbp , MI  CAD -MI 2015 , 09/2014 NSTEMI s/p LHC with sig 2V dz. dLAD diffusely diseased and not suitable for PCI. unsuccessful RCA PCI d/t heavy calcifications OBESITY  - Target wt = 178 for BMI <30  ASTHMA (ICD-493.90) ([name] not active)  -Xolair s 8/05 >10/2006  -Mastered HFA December 30, 2008  ALLERGIC RHINITIS (ICD-477.9)  S/p cervical fusion 2000 ............................................................................ [place]...........................................................................Marland KitchenWert  - Td 05/20/2015  - Pneumovax 09/2003 and October 27, 2009 Prevnar 05/07/2014  - Colonscopy 10/28/2004  - CPX 11/19/13  - GYN per [name]'s office -Mammogram 2013 , 2014 , 2018 MED CALENDAR 05/05/11 , 05/16/2013 , 08/06/2014 , 01/28/2015 , 08/26/2015 01/11/2017, 01/09/2019    01/09/2019 Follow up: Asthma, HTN, Hyperlipidemia , CAD, Med Review  Pt returns for a 1 month follow up . She has known Asthma , last visit with flare , treated with prednisone  taper . Says she is feeling better. Breathing is back to baseline .   She was also seen by cardiology , was having more fluid retention. Labs showed elevated BNP . Lasix was increased 40mg  daily .  Echo showed decreased EF at 40 to 45% with akinesis at the mid apical anteroseptal and inferoseptal and apical myocardium.  Hypokinesis of the anterior myocardium.  Grade 1 diastolic dysfunction. She says that her breathing has improved and her lower extremity swelling is decreased.  Patient has a chronic back pain she is followed by orthopedics for spinal stenosis.  She underwent back surgery in 2018.  Says she has been doing well with no back pain. Had some left hip pain but has resolved since starting chemo. Says she is getting around better now .   She has well-controlled hypertension.  She remains on Norvasc and bisoprolol.  Denies any known issues.  We discussed a healthy diet and exercise.  She has a known history of coronary disease and previous MI in 2015 .   She developed chest pain in October 2019.  She was subsequently admitted underwent cardiac cath revealing obstructive disease.  She underwent a CABG x2 with LAD endarterectomy.  Postop she had some volume overload that improved with diuresis.  She did have some postop anemia that was mild and did not require transfusion.  During surgery patient was found to have an enlarged mediastinal lymph node.  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 Patient is currently being followed by oncology.  She is undergoing chemotherapy cisplatin and gemcitabine   Patient has hyperlipidemia with LDL at goal.  Remains on Lipitor.  Patient has acid reflux.  She is on Prilosec and Pepcid. No flare of sx .      Allergies  Allergen Reactions  . Fish-Derived Products Shortness Of Breath, Swelling and Rash  . Penicillins Shortness Of Breath, Swelling and Rash    PATIENT HAS HAD A PCN REACTION WITH IMMEDIATE RASH, FACIAL/TONGUE/THROAT SWELLING, SOB, OR LIGHTHEADEDNESS WITH HYPOTENSION:  #  #  #  YES  #  #  #   Has patient had a PCN reaction causing severe rash involving mucus membranes or skin necrosis: No PATIENT HAS HAD A PCN REACTION THAT REQUIRED HOSPITALIZATION:  #  #  #  YES  #  #  #   Has patient had a PCN reaction occurring within the last 10 years: No    . Aspirin Swelling and Rash    SWELLING REACTION UNSPECIFIED   . Brilinta [Ticagrelor] Rash    Started Brilinta 09/2014 - rash - unsure which it is directly related to  . Codeine Phosphate Nausea Only  . Diphenhydramine Hcl Rash    Immunization History  Administered Date(s) Administered  . Influenza Split 11/09/2011, 11/13/2012, 09/19/2014  . Influenza Whole 10/04/2007, 10/27/2009, 10/20/2010  . Influenza, High Dose Seasonal PF 10/06/2016, 10/04/2017, 10/09/2018  . Influenza,inj,Quad PF,6+ Mos 11/19/2013, 09/19/2014, 08/26/2015  . Pneumococcal Conjugate-13 05/07/2014  . Pneumococcal Polysaccharide-23 09/20/2003, 10/27/2009  . Tdap 09/19/2005, 05/20/2015    Past Medical History:  Diagnosis Date  . Allergic rhinitis   . Arthritis   . Asthma    xolair s 8/05 ?11/07; mastered hfa 12/20/08  . Benign positional vertigo   . CAD (coronary artery disease)    a. 09/2014 NSTEMI s/p LHC with sig 2V dz. dLAD diffusely diseased and not suitable for PCI. unsuccessful RCA PCI d/t heavy calcifications  . GERD (gastroesophageal reflux disease)   . Heart attack ([place])    09/2014  . Hyperlipidemia    <130 ldl pos fm hx, bp  . Hypertension   . Osteopenia    dexa 08/22/07 AP spine + 1.1, left femur -1.3, right femur -.8; dexa 10/06/09 +1.6, left femur =1.6, right femur -.  . PONV (postoperative nausea and vomiting)   . Ruptured disc,  cervical   . 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 albuterol (PROAIR HFA) 108 (90 Base) MCG/ACT inhaler INHALE 1 TO 2 PUFFS BY MOUTH EVERY 4 HOURS AS NEEDED FOR WHEEZING 8.5 g 3  . atorvastatin (LIPITOR) 80 MG tablet TAKE 1 TABLET(80 MG) BY MOUTH DAILY 90 tablet 1  . bisoprolol (ZEBETA) 10 MG tablet Take 1 tablet (10 mg total) by mouth daily. 90 tablet 1  . celecoxib (CELEBREX) 200 MG capsule Take 1 capsule (200 mg total) by mouth 2 (two) times daily. 60 capsule 1  . cholecalciferol 2000 units TABS Take 1 tablet (2,000 Units total) by mouth daily. 30 tablet 3  . clopidogrel (PLAVIX) 75 MG tablet Take 1 tablet (75 mg total) by mouth daily. 90 tablet 3  . clotrimazole-betamethasone (LOTRISONE) cream Apply 1 application topically as  needed (itching). Use as directed for rash 30 g 2  . dextromethorphan-guaiFENesin (MUCINEX DM) 30-600 MG per 12 hr tablet Take 1-2 tablets by mouth every 12 (twelve) hours as needed for cough (with flutter).     . furosemide (LASIX) 20 MG tablet Take 2 tablets (40 mg total) by mouth daily. 90 tablet 3  . HYDROcodone-acetaminophen (NORCO/VICODIN) 5-325 MG tablet Take 1 tablet by mouth every 6 (six) hours as needed for moderate pain. 30 tablet 0  . lidocaine-prilocaine (EMLA) cream Apply to affected area once 30 g 3  . magnesium oxide (MAG-OX) 400 (241.3 Mg) MG tablet Take 1 tablet (400 mg total) by mouth daily. 30 tablet 1  . meclizine (ANTIVERT) 25 MG tablet TAKE 2 TABLETS BY MOUTH THREE TIMES DAILY AS NEEDED 24 tablet 2  . montelukast (SINGULAIR) 10 MG tablet TAKE 1 TABLET(10 MG) BY MOUTH AT BEDTIME 90 tablet 0  . Multiple Vitamin (MULTIVITAMIN) capsule Take 1 capsule by mouth daily.     . ondansetron (ZOFRAN) 8 MG  tablet Take 1 tablet (8 mg total) by mouth 2 (two) times daily as needed. Start on the third day after chemotherapy. 30 tablet 1  . oxyCODONE (OXY IR/ROXICODONE) 5 MG immediate release tablet Take 1 tablet (5 mg total) by mouth every 6 (six) hours as needed for severe pain. 25 tablet 0  . potassium chloride SA (K-DUR,KLOR-CON) 20 MEQ tablet Take 1 tablet (20 mEq total) by mouth 2 (two) times daily. 60 tablet 2  . prochlorperazine (COMPAZINE) 10 MG tablet Take 1 tablet (10 mg total) by mouth every 6 (six) hours as needed (Nausea or vomiting). 30 tablet 1  . SYMBICORT 160-4.5 MCG/ACT inhaler INHALE 2 PUFFS BY MOUTH EVERY 12 HOURS 10.2 g 11   No facility-administered medications prior to visit.      Review of Systems:   Constitutional:   No  , night sweats,  Fevers, chills,  +fatigue, or  lassitude.  HEENT:   No headaches,  Difficulty swallowing,  Tooth/dental problems, or  Sore throat,                No sneezing, itching, ear ache, nasal congestion, post nasal drip,   CV:  No chest pain,  Orthopnea, PND, swelling in lower extremities, anasarca, dizziness, palpitations, syncope.   GI  No heartburn, indigestion, abdominal pain, nausea, vomiting, diarrhea, change in bowel habits,   bloody stools.   Resp:  No excess mucus, no productive cough,  No non-productive cough,  No coughing up of blood.  No change in color of mucus.  No wheezing.  No chest wall deformity  Skin: no rash or lesions.  GU: no dysuria, change in color of urine, no urgency or frequency.  No flank pain, no hematuria   MS:  No joint pain or swelling.  No decreased range of motion.  No back pain.    Physical Exam  BP 118/62 (BP Location: Left Arm, Cuff Size: Normal)   Pulse 72   Ht 4\' 11"  (1.499 m)   Wt 115 lb 9.6 oz (52.4 kg)   LMP 12/21/1999   SpO2 98%   BMI 23.35 kg/m   GEN: A/Ox3; pleasant , NAD, elderly thin   HEENT:  [place]/AT,  EACs-clear, TMs-wnl, NOSE-clear, THROAT-clear, no lesions, no postnasal drip or  exudate noted.   NECK:  Supple w/ fair ROM; no JVD; normal carotid impulses w/o bruits; no thyromegaly or nodules palpated; no lymphadenopathy.    RESP  Clear  P & A;  w/o, wheezes/ rales/ or rhonchi. no accessory muscle use, no dullness to percussion  CARD:  RRR, no m/r/g, no peripheral edema, pulses intact, no cyanosis or clubbing.  GI:   Soft & nt; nml bowel sounds; no organomegaly or masses detected.   Musco: Warm bil, no deformities or joint swelling noted.   Neuro: alert, no focal deficits noted.    Skin: Warm, no lesions or rashes    Lab Results:  CBC    Component Value Date/Time   WBC 6.6 12/29/2018 0915   WBC 9.6 12/08/2018 1311   RBC 3.86 (L) 12/29/2018 0915   HGB 10.1 (L) 12/29/2018 0915   HGB 10.1 (L) 12/07/2018 0859   HCT 32.5 (L) 12/29/2018 0915   HCT 30.7 (L) 12/07/2018 0859   PLT 497 (H) 12/29/2018 0915   PLT 607 (H) 12/07/2018 0859   MCV 84.2 12/29/2018 0915   MCV 82 12/07/2018 0859   MCH 26.2 12/29/2018 0915   MCHC 31.1 12/29/2018 0915   RDW 16.2 (H) 12/29/2018 0915   RDW 14.5 12/07/2018 0859   LYMPHSABS 1.3 12/29/2018 0915   MONOABS 0.5 12/29/2018 0915   EOSABS 0.1 12/29/2018 0915   BASOSABS 0.0 12/29/2018 0915    BMET    Component Value Date/Time   NA 138 12/29/2018 0915   NA 140 12/07/2018 0859   K 3.9 12/29/2018 0915   CL 101 12/29/2018 0915   CO2 26 12/29/2018 0915   GLUCOSE 103 (H) 12/29/2018 0915   GLUCOSE 109 (H) 10/05/2006 1022   BUN 8 12/29/2018 0915   BUN 8 12/07/2018 0859   CREATININE 0.69 12/29/2018 0915   CREATININE 0.71 10/04/2016 1029   CALCIUM 9.5 12/29/2018 0915   GFRNONAA >60 12/29/2018 0915   GFRAA >60 12/29/2018 0915    BNP    Component Value Date/Time   BNP 1,223.7 (H) 12/07/2018 0859    ProBNP    Component Value Date/Time   PROBNP 1,313.0 (H) 11/27/2018 0957    Imaging: No results found.  alum & mag hydroxide-simeth (MAALOX/MYLANTA) 200-200-20 MG/5ML suspension 30 mL    Date Action Dose Route User    12/15/2018 1626 Given 30 mL Oral Wendall Mola M, RN    sodium chloride 0.9 % 500 mL with potassium chloride 20 mEq, magnesium sulfate 2 g infusion    Date Action Dose Route User   12/15/2018 1717 Rate/Dose Verify (none) (none) Georgianne Fick, RN   12/15/2018 1525 Rate/Dose Verify (none) (none) Georgianne Fick, RN   12/15/2018 1522 New Bag/Given (none) Intravenous Wendall Mola M, RN    CISplatin (PLATINOL) 38 mg in sodium chloride 0.9 % 250 mL chemo infusion    Date Action Dose Route User   12/15/2018 1519 Rate/Dose Verify (none) (none) Georgianne Fick, RN   12/15/2018 1420 Rate/Dose Change (none) (none) Georgianne Fick, RN   12/15/2018 1420 New Bag/Given 38 mg Intravenous Wendall Mola M, RN    CISplatin (PLATINOL) 38 mg in sodium chloride 0.9 % 250 mL chemo infusion    Date Action Dose Route User   12/29/2018 1451 Rate/Dose Change (none) (none) Jarvis Morgan, RN   12/29/2018 1451 New Bag/Given 38 mg Intravenous Otis Brace M, RN    dextrose 5 % and 0.45% NaCl 1,000 mL with potassium chloride 20 mEq, magnesium sulfate 12 mEq infusion    Date Action Dose Route User   12/15/2018 1236 Rate/Dose Verify (none) (none) Tami Lin, RN   12/15/2018 1040 Rate/Dose Verify (none) (none) Tami Lin,  RN   12/15/2018 1036 New Bag/Given (none) Intravenous Wendall Mola M, RN    dextrose 5 % and 0.45% NaCl 1,000 mL with potassium chloride 20 mEq, magnesium sulfate 12 mEq infusion    Date Action Dose Route User   12/29/2018 1728 Rate/Dose Change (none) (none) Jarvis Morgan, RN   12/29/2018 1554 Rate/Dose Change (none) (none) Jarvis Morgan, RN   12/29/2018 1552 Restarted (none) Intravenous Jarvis Morgan, RN   12/29/2018 1238 Rate/Dose Change (none) (none) Jarvis Morgan, RN   12/29/2018 1238 Rate/Dose Change (none) (none) Jarvis Morgan, RN    fosaprepitant (EMEND) 150 mg, dexamethasone (DECADRON) 12 mg in sodium chloride 0.9 % 145 mL IVPB    Date  Action Dose Route User   12/15/2018 1246 New Bag/Given (none) Intravenous Wendall Mola M, RN    fosaprepitant (EMEND) 150 mg, dexamethasone (DECADRON) 12 mg in sodium chloride 0.9 % 145 mL IVPB    Date Action Dose Route User   12/29/2018 1316 New Bag/Given (none) Intravenous Jarvis Morgan, RN    furosemide (LASIX) injection 20 mg    Date Action Dose Route User   12/15/2018 1240 Given 20 mg Intravenous Wendall Mola M, RN    furosemide (LASIX) injection 20 mg    Date Action Dose Route User   12/29/2018 1311 Given 20 mg Intravenous Jarvis Morgan, RN    gemcitabine (GEMZAR) 1,520 mg in sodium chloride 0.9 % 250 mL chemo infusion    Date Action Dose Route User   12/15/2018 1415 Rate/Dose Verify (none) (none) Georgianne Fick, RN   12/15/2018 1413 Restarted (none) (none) Georgianne Fick, RN   12/15/2018 1343 Rate/Dose Change (none) (none) Georgianne Fick, RN   12/15/2018 1342 New Bag/Given 1520 mg Intravenous Wendall Mola M, RN    gemcitabine (GEMZAR) 1,520 mg in sodium chloride 0.9 % 250 mL chemo infusion    Date Action Dose Route User   12/29/2018 1442 Restarted (none) (none) Jarvis Morgan, RN   12/29/2018 1436 Rate/Dose Verify (none) (none) Jarvis Morgan, RN   12/29/2018 1409 Rate/Dose Change (none) (none) Jarvis Morgan, RN   12/29/2018 1409 New Bag/Given 1520 mg Intravenous Jarvis Morgan, RN    heparin lock flush 100 unit/mL    Date Action Dose Route User   12/15/2018 1736 Given 500 Units Intracatheter Alfonse Flavors C, RN    heparin lock flush 100 unit/mL    Date Action Dose Route User   12/29/2018 1743 Given 500 Units Intracatheter Glennon Hamilton, Windy Carina, RN    lidocaine (XYLOCAINE) 2 % viscous mouth solution 15 mL    Date Action Dose Route User   12/15/2018 1626 Given 15 mL Oral Wendall Mola M, RN    palonosetron (ALOXI) injection 0.25 mg    Date Action Dose Route User   12/15/2018 1238 Given 0.25 mg Intravenous Tami Lin, RN    palonosetron  (ALOXI) injection 0.25 mg    Date Action Dose Route User   12/29/2018 1247 Given 0.25 mg Intravenous Jarvis Morgan, RN    0.9 %  sodium chloride infusion    Date Action Dose Route User   12/15/2018 1010 Rate/Dose Verify (none) (none) Georgianne Fick, RN   12/15/2018 1009 New Bag/Given (none) Intravenous Wendall Mola M, RN    0.9 %  sodium chloride infusion    Date Action Dose Route User   12/29/2018 1729 Restarted (none) (none) Jarvis Morgan, RN   12/29/2018 1554 Rate/Dose Change (  none) (none) Jarvis Morgan, RN   12/29/2018 1554 Rate/Dose Change (none) (none) Jarvis Morgan, RN   12/29/2018 1554 Rate/Dose Change (none) (none) Jarvis Morgan, RN   12/29/2018 1551 Rate/Dose Change (none) (none) Jarvis Morgan, RN    sodium chloride flush (NS) 0.9 % injection 10 mL    Date Action Dose Route User   12/14/2018 1055 Given 10 mL Intravenous Bunton, Lori H, LPN    sodium chloride flush (NS) 0.9 % injection 10 mL    Date Action Dose Route User   12/15/2018 1736 Given 10 mL Intracatheter Georgianne Fick, RN    sodium chloride flush (NS) 0.9 % injection 10 mL    Date Action Dose Route User   12/29/2018 0903 Given 10 mL Intravenous Kelli Hope, LPN    sodium chloride flush (NS) 0.9 % injection 10 mL    Date Action Dose Route User   12/29/2018 1743 Given 10 mL Intracatheter Jarvis Morgan, RN      PFT Results Latest Ref Rng & Units 10/02/2018  FVC-Pre L 1.57  FVC-Predicted Pre % 96  Pre FEV1/FVC % % 60  FEV1-Pre L 0.95  FEV1-Predicted Pre % 76    Lab Results  Component Value Date   NITRICOXIDE 9 07/08/2016        Assessment & Plan:   Moderate persistent asthma in adult without complication Recent flare now improved.  Patient's medications were reviewed today and patient education was given. Computerized medication calendar was adjusted/completed   Plan  Patient Instructions  Continue on current regimen .  Continue on low fat cholesterol diet  Low salt diet .  High protein .  Follow up with Oncology as planned.  Follow med calendar closely and bring to each visit .  Follow up Dr. Melvyn Novas  In 3 months and As needed        Essential hypertension Well-controlled on current regimen no changes  Hyperlipidemia with target LDL less than 70 Continue on current regimen LDL is near her goal  GERD (gastroesophageal reflux disease) Doing well therapy no changes  CAD (coronary artery disease) Coronary disease status post CABG October 2019.  Patient is doing very well since surgery.  She continue to follow-up with cardiology.  Gallbladder cancer [facility]) Recently diagnosed metastatic gallbladder cancer.  Patient amazingly seems to be doing very well undergoing current treatment.  To continue follow-up with oncology.      Rexene Edison, NP 01/09/2019

## 2019-01-09 NOTE — Assessment & Plan Note (Signed)
Doing well therapy no changes

## 2019-01-09 NOTE — Assessment & Plan Note (Signed)
Recent flare now improved.  Patient's medications were reviewed today and patient education was given. Computerized medication calendar was adjusted/completed   Plan  Patient Instructions  Continue on current regimen .  Continue on low fat cholesterol diet  Low salt diet . High protein .  Follow up with Oncology as planned.  Follow med calendar closely and bring to each visit .  Follow up Dr. Melvyn Novas  In 3 months and As needed

## 2019-01-09 NOTE — Assessment & Plan Note (Signed)
Continue on current regimen LDL is near her goal

## 2019-01-09 NOTE — Assessment & Plan Note (Signed)
Coronary disease status post CABG October 2019.  Patient is doing very well since surgery.  She continue to follow-up with cardiology.

## 2019-01-09 NOTE — Assessment & Plan Note (Signed)
Well-controlled on current regimen no changes 

## 2019-01-09 NOTE — Progress Notes (Signed)
Chart and office note reviewed in detail  > agree with a/p as outlined    

## 2019-01-11 ENCOUNTER — Inpatient Hospital Stay: Payer: Medicare Other | Admitting: Nutrition

## 2019-01-11 ENCOUNTER — Inpatient Hospital Stay (HOSPITAL_BASED_OUTPATIENT_CLINIC_OR_DEPARTMENT_OTHER): Payer: Medicare Other | Admitting: Hematology

## 2019-01-11 ENCOUNTER — Inpatient Hospital Stay: Payer: Medicare Other

## 2019-01-11 ENCOUNTER — Encounter: Payer: Self-pay | Admitting: Hematology

## 2019-01-11 ENCOUNTER — Telehealth: Payer: Self-pay

## 2019-01-11 VITALS — BP 114/71 | HR 92 | Temp 97.6°F | Resp 18 | Ht 59.0 in | Wt 115.7 lb

## 2019-01-11 DIAGNOSIS — C7951 Secondary malignant neoplasm of bone: Secondary | ICD-10-CM

## 2019-01-11 DIAGNOSIS — C23 Malignant neoplasm of gallbladder: Secondary | ICD-10-CM

## 2019-01-11 DIAGNOSIS — C771 Secondary and unspecified malignant neoplasm of intrathoracic lymph nodes: Secondary | ICD-10-CM | POA: Diagnosis not present

## 2019-01-11 DIAGNOSIS — E876 Hypokalemia: Secondary | ICD-10-CM

## 2019-01-11 DIAGNOSIS — Z5111 Encounter for antineoplastic chemotherapy: Secondary | ICD-10-CM | POA: Diagnosis not present

## 2019-01-11 DIAGNOSIS — D649 Anemia, unspecified: Secondary | ICD-10-CM

## 2019-01-11 DIAGNOSIS — I2510 Atherosclerotic heart disease of native coronary artery without angina pectoris: Secondary | ICD-10-CM | POA: Diagnosis not present

## 2019-01-11 DIAGNOSIS — J45909 Unspecified asthma, uncomplicated: Secondary | ICD-10-CM

## 2019-01-11 LAB — CBC WITH DIFFERENTIAL (CANCER CENTER ONLY)
Abs Immature Granulocytes: 0.01 10*3/uL (ref 0.00–0.07)
Basophils Absolute: 0 10*3/uL (ref 0.0–0.1)
Basophils Relative: 0 %
EOS PCT: 2 %
Eosinophils Absolute: 0.1 10*3/uL (ref 0.0–0.5)
HCT: 33.3 % — ABNORMAL LOW (ref 36.0–46.0)
Hemoglobin: 10.4 g/dL — ABNORMAL LOW (ref 12.0–15.0)
Immature Granulocytes: 0 %
Lymphocytes Relative: 20 %
Lymphs Abs: 1.3 10*3/uL (ref 0.7–4.0)
MCH: 26.9 pg (ref 26.0–34.0)
MCHC: 31.2 g/dL (ref 30.0–36.0)
MCV: 86 fL (ref 80.0–100.0)
Monocytes Absolute: 0.5 10*3/uL (ref 0.1–1.0)
Monocytes Relative: 8 %
Neutro Abs: 4.4 10*3/uL (ref 1.7–7.7)
Neutrophils Relative %: 70 %
Platelet Count: 339 10*3/uL (ref 150–400)
RBC: 3.87 MIL/uL (ref 3.87–5.11)
RDW: 18.1 % — ABNORMAL HIGH (ref 11.5–15.5)
WBC Count: 6.4 10*3/uL (ref 4.0–10.5)
nRBC: 0 % (ref 0.0–0.2)

## 2019-01-11 LAB — CMP (CANCER CENTER ONLY)
ALT: 25 U/L (ref 0–44)
AST: 32 U/L (ref 15–41)
Albumin: 3.4 g/dL — ABNORMAL LOW (ref 3.5–5.0)
Alkaline Phosphatase: 143 U/L — ABNORMAL HIGH (ref 38–126)
Anion gap: 10 (ref 5–15)
BUN: 13 mg/dL (ref 8–23)
CO2: 27 mmol/L (ref 22–32)
CREATININE: 0.72 mg/dL (ref 0.44–1.00)
Calcium: 9.7 mg/dL (ref 8.9–10.3)
Chloride: 103 mmol/L (ref 98–111)
GFR, Est AFR Am: >60 mL/min (ref >60)
GFR, Estimated: >60 mL/min (ref >60)
Glucose, Bld: 131 mg/dL — ABNORMAL HIGH (ref 70–99)
Potassium: 4.5 mmol/L (ref 3.5–5.1)
Sodium: 140 mmol/L (ref 135–145)
Total Bilirubin: 0.3 mg/dL (ref 0.3–1.2)
Total Protein: 8.5 g/dL — ABNORMAL HIGH (ref 6.5–8.1)

## 2019-01-11 MED ORDER — SODIUM CHLORIDE 0.9% FLUSH
10.0000 mL | Freq: Once | INTRAVENOUS | Status: AC | PRN
  Administered 2019-01-11: 10 mL
  Filled 2019-01-11: qty 10

## 2019-01-11 MED ORDER — HEPARIN SOD (PORK) LOCK FLUSH 100 UNIT/ML IV SOLN
500.0000 [IU] | Freq: Once | INTRAVENOUS | Status: AC | PRN
  Administered 2019-01-11: 500 [IU]
  Filled 2019-01-11: qty 5

## 2019-01-11 NOTE — Progress Notes (Signed)
78 year old female diagnosed with gallbladder cancer.  Past medical history includes hypertension, hyperlipidemia, GERD, and CAD.  Medications include Lipitor, Lasix, magnesium oxide, multivitamin, Zofran, and Compazine.  Labs include glucose 131 and albumin 3.4.  Height: 4 feet 11 inches. Weight: 115.7 pounds. Usual body weight: 140 pounds in February 2019. BMI: 23.37.  Patient reports she has tried oral nutrition supplements but they gave her diarrhea. 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 She reports she is having to diarrhea as result of her chemotherapy. She is looking for ways to add protein to her diet. Nutrition focused physical exam was deferred.  Nutrition diagnosis:  Unintended weight loss related to inadequate oral intake as evidenced by 18% weight loss over 11 months.  Intervention: Educated patient on strategies for increasing calories and protein and small frequent meals and snacks. Reviewed high-protein foods. Provided samples of clear liquid oral nutrition supplements and coupons. Provided fact sheets. Educated patient on strategies for improving diarrhea. Questions were answered.  Teach back method used.  Contact information provided.  Monitoring, evaluation, goals: Patient will tolerate increased calories and protein to minimize weight loss.  Next visit: Friday, March 6 during infusion.  **Disclaimer: This note was dictated with voice recognition software. Similar sounding words can inadvertently be transcribed and this note may contain transcription errors which may not have been corrected upon publication of note.**

## 2019-01-11 NOTE — Telephone Encounter (Signed)
Printed avs and calender of upcoming appointment. Per 12/3 los 

## 2019-01-12 ENCOUNTER — Other Ambulatory Visit: Payer: Self-pay | Admitting: Hematology

## 2019-01-12 ENCOUNTER — Inpatient Hospital Stay: Payer: Medicare Other

## 2019-01-12 VITALS — BP 124/74 | HR 88 | Temp 97.4°F | Resp 18

## 2019-01-12 DIAGNOSIS — Z5111 Encounter for antineoplastic chemotherapy: Secondary | ICD-10-CM | POA: Diagnosis not present

## 2019-01-12 DIAGNOSIS — C23 Malignant neoplasm of gallbladder: Secondary | ICD-10-CM

## 2019-01-12 DIAGNOSIS — Z7189 Other specified counseling: Secondary | ICD-10-CM

## 2019-01-12 MED ORDER — SODIUM CHLORIDE 0.9 % IV SOLN
25.0000 mg/m2 | Freq: Once | INTRAVENOUS | Status: AC
  Administered 2019-01-12: 38 mg via INTRAVENOUS
  Filled 2019-01-12: qty 38

## 2019-01-12 MED ORDER — FUROSEMIDE 10 MG/ML IJ SOLN
INTRAMUSCULAR | Status: AC
  Filled 2019-01-12: qty 2

## 2019-01-12 MED ORDER — FUROSEMIDE 10 MG/ML IJ SOLN
20.0000 mg | Freq: Once | INTRAMUSCULAR | Status: AC
  Administered 2019-01-12: 20 mg via INTRAMUSCULAR

## 2019-01-12 MED ORDER — PALONOSETRON HCL INJECTION 0.25 MG/5ML
INTRAVENOUS | Status: AC
  Filled 2019-01-12: qty 5

## 2019-01-12 MED ORDER — SODIUM CHLORIDE 0.9% FLUSH
10.0000 mL | INTRAVENOUS | Status: DC | PRN
  Administered 2019-01-12: 10 mL
  Filled 2019-01-12: qty 10

## 2019-01-12 MED ORDER — SODIUM CHLORIDE 0.9 % IV SOLN
Freq: Once | INTRAVENOUS | Status: AC
  Administered 2019-01-12: 11:00:00 via INTRAVENOUS
  Filled 2019-01-12: qty 5

## 2019-01-12 MED ORDER — POTASSIUM CHLORIDE 2 MEQ/ML IV SOLN
Freq: Once | INTRAVENOUS | Status: AC
  Administered 2019-01-12: 10:00:00 via INTRAVENOUS
  Filled 2019-01-12: qty 10

## 2019-01-12 MED ORDER — SODIUM CHLORIDE 0.9 % IV SOLN
1000.0000 mg/m2 | Freq: Once | INTRAVENOUS | Status: AC
  Administered 2019-01-12: 1520 mg via INTRAVENOUS
  Filled 2019-01-12: qty 39.98

## 2019-01-12 MED ORDER — SODIUM CHLORIDE 0.9 % IV SOLN
Freq: Once | INTRAVENOUS | Status: AC
  Administered 2019-01-12: 10:00:00 via INTRAVENOUS
  Filled 2019-01-12: qty 250

## 2019-01-12 MED ORDER — PALONOSETRON HCL INJECTION 0.25 MG/5ML
0.2500 mg | Freq: Once | INTRAVENOUS | Status: AC
  Administered 2019-01-12: 0.25 mg via INTRAVENOUS

## 2019-01-12 MED ORDER — HEPARIN SOD (PORK) LOCK FLUSH 100 UNIT/ML IV SOLN
500.0000 [IU] | Freq: Once | INTRAVENOUS | Status: AC | PRN
  Administered 2019-01-12: 500 [IU]
  Filled 2019-01-12: qty 5

## 2019-01-15 ENCOUNTER — Other Ambulatory Visit: Payer: Self-pay | Admitting: Cardiovascular Disease

## 2019-01-16 NOTE — Addendum Note (Signed)
Addended by: Parke Poisson E on: 01/16/2019 10:21 AM   Modules accepted: Orders

## 2019-01-23 NOTE — Progress Notes (Signed)
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 Lymph node, biopsy, mediastinal - LYMPH NODE WITH METASTATIC ADENOCARCINOMA. - SEE MICROSCOPIC DESCRIPTION 2. Plaque, coronary artery - CALCIFIED ATHEROSCLEROTIC PLAQUE.    10/14/2018 Miscellaneous    Foundation One:  MSI stable tumor mutation burden 3Muts/mb ERBB2 amplification CCNE1 amplification TP53 mutation(+) CDK6 amplification  HGF amplification     11/13/2018 Cancer Staging    Staging form: Gallbladder, AJCC 8th Edition - Clinical stage from 11/13/2018: Stage IVB (cTX, cN2, pM1) - Signed by Truitt Merle, MD on 12/15/2018    11/17/2018 Imaging    11/17/2018 CT CAP IMPRESSION: 1. 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 This is associated with extensive upper abdominal and retroperitoneal lymphadenopathy, as well as metastatic lymphadenopathy in the posterior mediastinum and left supraclavicular region. There is also a metastatic lesion to the left ischium. 2. 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 3. Aortic atherosclerosis, in addition to left main and 3 vessel coronary artery disease. Status post median sternotomy for CABG including LIMA to the LAD. 4. There are calcifications of the aortic valve. Echocardiographic correlation for evaluation of potential  valvular dysfunction may be warranted if clinically indicated.     11/21/2018 Initial Diagnosis    Gallbladder cancer ([place])    11/27/2018 Pathology Results    11/27/2018 CA19-9 immunohistochemical stain Per request, a CA19-9 immunohistochemical stain was performed at an outside institution revealing positive staining in the tumor cells.     12/15/2018 -  Chemotherapy     first line chemo cisplatin and gemcitabine every 2 weeks on 12/15/18     CURRENT THERAPY   first linechemo cisplatin and gemcitabineevery 2 weekson 12/15/18  INTERVAL HISTORY: Cassie Campbell is a 78 y.o. female who is here for follow-up. Today, she is here with a family member. She is doing well and is tolerating chemotherapy well. She only felt tired for 1 day after chemotherapy. She describes mild diarrhea, but takes imodium, and has mild SOB. She is snacking frequently and has gained 2 lbs. Denies nausea. She is able to move around at home.    Pertinent positives and negatives of review of systems are listed and detailed within the above HPI.  REVIEW OF SYSTEMS:   Constitutional: Denies fevers, chills or abnormal weight loss Eyes: Denies blurriness of vision Ears, nose, mouth, throat, and face: Denies mucositis or sore throat Respiratory: Denies cough, wheezes, (+) mild SOB Cardiovascular: Denies palpitation, chest discomfort or lower extremity swelling Gastrointestinal:  Denies nausea, heartburn, (+) mild diarrhea  Skin: Denies abnormal skin rashes Lymphatics: Denies new lymphadenopathy or easy bruising Neurological:Denies numbness, tingling or new weaknesses Behavioral/Psych: Mood is stable, no new changes  All other systems were reviewed with the patient and are negative.  MEDICAL HISTORY:  Past Medical History:  Diagnosis Date  . Allergic rhinitis   . Arthritis   . Asthma  xolair s 8/05 ?11/07; mastered hfa 12/20/08  . Benign positional vertigo   . CAD (coronary artery disease)    a. 09/2014  NSTEMI s/p LHC with sig 2V dz. dLAD diffusely diseased and not suitable for PCI. unsuccessful RCA PCI d/t heavy calcifications  . GERD (gastroesophageal reflux disease)   . Heart attack ([place])    09/2014  . Hyperlipidemia    <130 ldl pos fm hx, bp  . Hypertension   . Osteopenia    dexa 08/22/07 AP spine + 1.1, left femur -1.3, right femur -.8; dexa 10/06/09 +1.6, left femur =1.6, right femur -.  . PONV (postoperative nausea and vomiting)   . Ruptured disc, cervical   . 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 BREAST SURGERY  10-26-10   Rt. breast bx--for microcalcifications in rt. retroareolar region--dx was hyalinized fibroadenoma  . BUNIONECTOMY Bilateral   . CARDIAC CATHETERIZATION     2015  . CATARACT EXTRACTION Right 02/02/2018   Dr Satira Sark  . CATARACT EXTRACTION Left 03/30/2018  . [place] SURGERY  12/01  . CORONARY ARTERY BYPASS GRAFT N/A 10/04/2018   Procedure: CORONARY ARTERY BYPASS GRAFTING (CABG) times 2 using left  Internal mammary artery to LAD, left greater saphenous vein - open harvest.;  Surgeon: [name], MD;  Location: [place];  Service: Open Heart Surgery;  Laterality: N/A;  . IR IMAGING GUIDED PORT INSERTION  12/08/2018  . LEFT HEART CATH AND CORONARY ANGIOGRAPHY N/A 09/27/2018   Procedure: LEFT HEART CATH AND CORONARY ANGIOGRAPHY;  Surgeon: Troy Sine, MD;  Location: [facility];  Service: Cardiovascular;  Laterality: N/A;  . LEFT HEART CATHETERIZATION WITH CORONARY ANGIOGRAM N/A 10/16/2014   Procedure: LEFT HEART CATHETERIZATION WITH CORONARY ANGIOGRAM;  Surgeon: Blane Ohara, MD;  Location[facility] LAB;  Service: Cardiovascular;  Laterality: N/A;  . Left L4-5 transforaminal lumbar interbody fusion with Depuy cage, rods and screws, local and allograft bone graft, Vivigen; bilateral decompression/partial hemilaminectomy lumbar five-sacral  one  07/2017  . PERCUTANEOUS CORONARY ROTOBLATOR INTERVENTION (PCI-R) N/A 10/17/2014   Procedure: PERCUTANEOUS CORONARY ROTOBLATOR INTERVENTION (PCI-R);  Surgeon: Troy Sine, MD;  Location: [facility] CATH LAB;  Service: Cardiovascular;  Laterality: N/A;  . TEE WITHOUT CARDIOVERSION N/A 10/04/2018   Procedure: TRANSESOPHAGEAL ECHOCARDIOGRAM (TEE);  Surgeon: [name], MD;  Location: [facility];  Service: Open Heart Surgery;  Laterality: N/A;  . VEIN LIGATION AND STRIPPING Left   . VIDEO BRONCHOSCOPY Bilateral 02/05/2015   Procedure: VIDEO BRONCHOSCOPY WITHOUT FLUORO;  Surgeon: Tanda Rockers, MD;  Location: [facility] ENDOSCOPY;  Service: Endoscopy;  Laterality: Bilateral;  . VULVA /PERINEUM BIOPSY  12-30-10   --epidermoid cyst    I have reviewed the social history and family history with the patient and they are unchanged from previous note.  ALLERGIES:  is allergic to fish-derived products; penicillins; aspirin; brilinta [ticagrelor]; codeine phosphate; and diphenhydramine hcl.  MEDICATIONS:  Current Outpatient Medications  Medication Sig Dispense Refill  . albuterol (PROAIR HFA) 108 (90 Base) MCG/ACT inhaler INHALE 1 TO 2 PUFFS BY MOUTH EVERY 4 HOURS AS NEEDED FOR WHEEZING 8.5 g 3  . atorvastatin (LIPITOR) 80 MG tablet TAKE 1 TABLET(80 MG) BY MOUTH DAILY 90 tablet 1  . bisoprolol (ZEBETA) 10 MG tablet Take 1 tablet (10 mg total) by mouth daily. 90 tablet 1  . celecoxib (CELEBREX) 200 MG capsule Take 200 mg by mouth 2 (  two) times daily as needed.    . cetirizine (ZYRTEC) 10 MG tablet Take 10 mg by mouth at bedtime as needed for allergies.    . cholecalciferol 2000 units TABS Take 1 tablet (2,000 Units total) by mouth daily. 30 tablet 3  . clopidogrel (PLAVIX) 75 MG tablet Take 1 tablet (75 mg total) by mouth daily. 90 tablet 3  . dextromethorphan-guaiFENesin (MUCINEX DM) 30-600 MG per 12 hr tablet Take 1-2 tablets by mouth every 12 (twelve) hours as needed for cough (with flutter).     .  famotidine (PEPCID) 20 MG tablet Take 20 mg by mouth at bedtime.    . ferrous sulfate 325 (65 FE) MG tablet Take 325 mg by mouth 2 (two) times daily with a meal.    . fluticasone (FLONASE) 50 MCG/ACT nasal spray Place 1-2 sprays into both nostrils 2 (two) times daily as needed for allergies or rhinitis.    . furosemide (LASIX) 20 MG tablet Take 2 tablets (40 mg total) by mouth daily. 90 tablet 3  . HYDROcodone-acetaminophen (NORCO/VICODIN) 5-325 MG tablet Take 1 tablet by mouth every 6 (six) hours as needed for moderate pain. 30 tablet 0  . lidocaine-prilocaine (EMLA) cream Apply to affected area once 30 g 3  . magnesium oxide (MAG-OX) 400 (241.3 Mg) MG tablet Take 1 tablet (400 mg total) by mouth daily. 30 tablet 1  . meclizine (ANTIVERT) 25 MG tablet TAKE 2 TABLETS BY MOUTH THREE TIMES DAILY AS NEEDED 24 tablet 2  . montelukast (SINGULAIR) 10 MG tablet TAKE 1 TABLET(10 MG) BY MOUTH AT BEDTIME 90 tablet 0  . Multiple Vitamin (MULTIVITAMIN) capsule Take 1 capsule by mouth daily.     Marland Kitchen omeprazole (PRILOSEC) 20 MG capsule Take 20 mg by mouth daily.    . ondansetron (ZOFRAN) 8 MG tablet Take 1 tablet (8 mg total) by mouth 2 (two) times daily as needed. Start on the third day after chemotherapy. 30 tablet 1  . potassium chloride SA (K-DUR,KLOR-CON) 20 MEQ tablet Take 1 tablet (20 mEq total) by mouth 2 (two) times daily. 60 tablet 2  . prochlorperazine (COMPAZINE) 10 MG tablet Take 1 tablet (10 mg total) by mouth every 6 (six) hours as needed (Nausea or vomiting). 30 tablet 1  . SYMBICORT 160-4.5 MCG/ACT inhaler INHALE 2 PUFFS BY MOUTH EVERY 12 HOURS 10.2 g 11   No current facility-administered medications for this visit.    Facility-Administered Medications Ordered in Other Visits  Medication Dose Route Frequency Provider Last Rate Last Dose  . sodium chloride flush (NS) 0.9 % injection 10 mL  10 mL Intracatheter PRN Truitt Merle, MD   10 mL at 01/25/19 0950    PHYSICAL EXAMINATION: ECOG PERFORMANCE  STATUS: 1 - Symptomatic but completely ambulatory  Vitals:   01/25/19 1008  BP: 121/69  Pulse: 78  Resp: 18  Temp: 97.9 F (36.6 C)  SpO2: 98%   Filed Weights   01/25/19 1008  Weight: 117 lb (53.1 kg)    GENERAL:alert, no distress and comfortable SKIN: skin color, texture, turgor are normal, no rashes or significant lesions EYES: normal, Conjunctiva are pink and non-injected, sclera clear OROPHARYNX:no exudate, no erythema and lips, buccal mucosa, and tongue normal  NECK: supple, thyroid normal size, non-tender, without nodularity LYMPH:  no palpable lymphadenopathy in the cervical, axillary or inguinal LUNGS: clear to auscultation and percussion with normal breathing effort HEART: regular rate & rhythm and no murmurs and no lower extremity edema ABDOMEN:abdomen soft, non-tender and normal bowel  sounds Musculoskeletal:no cyanosis of digits and no clubbing  NEURO: alert & oriented x 3 with fluent speech, no focal motor/sensory deficits  LABORATORY DATA:  I have reviewed the data as listed CBC Latest Ref Rng & Units 01/25/2019 01/11/2019 12/29/2018  WBC 4.0 - 10.5 K/uL 5.6 6.4 6.6  Hemoglobin 12.0 - 15.0 g/dL 10.5(L) 10.4(L) 10.1(L)  Hematocrit 36.0 - 46.0 % 33.2(L) 33.3(L) 32.5(L)  Platelets 150 - 400 K/uL 262 339 497(H)     CMP Latest Ref Rng & Units 01/25/2019 01/11/2019 12/29/2018  Glucose 70 - 99 mg/dL 104(H) 131(H) 103(H)  BUN 8 - 23 mg/dL _0 Creatinine 0.44 - 1.00 mg/dL 0.73 0.72 0.69  Sodium 135 - 145 mmol/L 140 140 138  Potassium 3.5 - 5.1 mmol/L 4.3 4.5 3.9  Chloride 98 - 111 mmol/L 103 103 101  CO2 22 - 32 mmol/L _1 Calcium 8.9 - 10.3 mg/dL 9.6 9.7 9.5  Total Protein 6.5 - 8.1 g/dL 8.1 8.5(H) 8.5(H)  Total Bilirubin 0.3 - 1.2 mg/dL 0.6 0.3 0.5  Alkaline Phos 38 - 126 U/L 121 143(H) 124  AST 15 - 41 U/L 23 32 43(H)  ALT 0 - 44 U/L _2 RADIOGRAPHIC STUDIES: I have personally reviewed the radiological images as listed and agreed with the  findings in the report. No results found.   ASSESSMENT & PLAN:  Cassie Campbell is a 79 y.o. 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 She was otherwise asymptomatic  -she has been on first line cisplatin and gemcitabineevery 2 weeks (due to her advanced age, and CHF), started 12/15/2018. Tolerating well. - Her Foundation One genomic testing results showed MSI stable disease, low tumor mutation burden, no targetable mutation such as FGFR fusion or IDH mutation, but (+) HER2 amplification, may try Herceptin in the future. -will monitor CA19.9 every 4 weeks  -Patient is tolerating chemotherapy very well, with only mild fatigue.  Lab reviewed, adequate for treatment, will proceed to cycle 4 chemo today -Plan to repeat restaging PET or CT scan after 6 cycle of treatment.  2. CAD, S/P CABGX2 on 09/27/2018, EF 40-45% -Currently on Plavix. She also takes lasix during chemo. -f/u with Dr. Claiborne Billings -stable, she appears to be euvolemic   3. Asthma -Currently on Symbicort, Singulair, and albuterol as needed -f/u with pulmonary, stable   4.Goal of care discussion -The patient understands the goal of care is palliative. -she is full code for now   5. Hypokalemia  -Likely secondary to lasix. Currently on KCL 66mq once daily -Labs reviewed, K 4.3 today   6. Mild Anemia  -Secondary to chemo and her recent open heart surgery  -Labs reviewed, hemoglobin 10.5, stable.   Plan  -Labs reviewed, adequate for treatment, proceed with chemotherapy tomorrow, and continue every 2 weeks  -Repeat scans in 3-4 weeks, will order PET   -F/u in 02/08/2019  No problem-specific Assessment & Plan notes found for this encounter.   No orders of the defined types were placed in this encounter.  All questions were answered. The patient knows to call the clinic with any problems, questions or concerns.  No barriers to learning was detected. I spent 20 minutes counseling the patient face to face. The total time spent in the appointment was 25 minutes and more than 50% was on counseling and review of test results  I, Manson Allan am acting as scribe for Dr. Truitt Merle.  I have reviewed the above documentation for accuracy and completeness, and I agree with the above.     Truitt Merle, MD 01/25/2019

## 2019-01-25 ENCOUNTER — Inpatient Hospital Stay (HOSPITAL_BASED_OUTPATIENT_CLINIC_OR_DEPARTMENT_OTHER): Payer: Medicare Other | Admitting: Hematology

## 2019-01-25 ENCOUNTER — Inpatient Hospital Stay: Payer: Medicare Other | Attending: Hematology

## 2019-01-25 ENCOUNTER — Encounter: Payer: Self-pay | Admitting: Hematology

## 2019-01-25 ENCOUNTER — Inpatient Hospital Stay: Payer: Medicare Other

## 2019-01-25 VITALS — BP 121/69 | HR 78 | Temp 97.9°F | Resp 18 | Ht 59.0 in | Wt 117.0 lb

## 2019-01-25 DIAGNOSIS — C23 Malignant neoplasm of gallbladder: Secondary | ICD-10-CM

## 2019-01-25 DIAGNOSIS — D6481 Anemia due to antineoplastic chemotherapy: Secondary | ICD-10-CM | POA: Diagnosis not present

## 2019-01-25 DIAGNOSIS — J45909 Unspecified asthma, uncomplicated: Secondary | ICD-10-CM | POA: Insufficient documentation

## 2019-01-25 DIAGNOSIS — C7951 Secondary malignant neoplasm of bone: Secondary | ICD-10-CM | POA: Insufficient documentation

## 2019-01-25 DIAGNOSIS — E876 Hypokalemia: Secondary | ICD-10-CM

## 2019-01-25 DIAGNOSIS — Z5111 Encounter for antineoplastic chemotherapy: Secondary | ICD-10-CM | POA: Insufficient documentation

## 2019-01-25 DIAGNOSIS — Z452 Encounter for adjustment and management of vascular access device: Secondary | ICD-10-CM | POA: Diagnosis not present

## 2019-01-25 DIAGNOSIS — I2510 Atherosclerotic heart disease of native coronary artery without angina pectoris: Secondary | ICD-10-CM | POA: Insufficient documentation

## 2019-01-25 DIAGNOSIS — Z7189 Other specified counseling: Secondary | ICD-10-CM

## 2019-01-25 LAB — CBC WITH DIFFERENTIAL (CANCER CENTER ONLY)
Abs Immature Granulocytes: 0.02 10*3/uL (ref 0.00–0.07)
BASOS ABS: 0 10*3/uL (ref 0.0–0.1)
Basophils Relative: 1 %
Eosinophils Absolute: 0.1 10*3/uL (ref 0.0–0.5)
Eosinophils Relative: 3 %
HCT: 33.2 % — ABNORMAL LOW (ref 36.0–46.0)
Hemoglobin: 10.5 g/dL — ABNORMAL LOW (ref 12.0–15.0)
Immature Granulocytes: 0 %
LYMPHS ABS: 1.3 10*3/uL (ref 0.7–4.0)
Lymphocytes Relative: 23 %
MCH: 27.3 pg (ref 26.0–34.0)
MCHC: 31.6 g/dL (ref 30.0–36.0)
MCV: 86.5 fL (ref 80.0–100.0)
Monocytes Absolute: 0.5 10*3/uL (ref 0.1–1.0)
Monocytes Relative: 9 %
NEUTROS PCT: 64 %
NRBC: 0.4 % — AB (ref 0.0–0.2)
Neutro Abs: 3.7 10*3/uL (ref 1.7–7.7)
Platelet Count: 262 10*3/uL (ref 150–400)
RBC: 3.84 MIL/uL — ABNORMAL LOW (ref 3.87–5.11)
RDW: 20.7 % — ABNORMAL HIGH (ref 11.5–15.5)
WBC Count: 5.6 10*3/uL (ref 4.0–10.5)

## 2019-01-25 LAB — CMP (CANCER CENTER ONLY)
ALT: 18 U/L (ref 0–44)
AST: 23 U/L (ref 15–41)
Albumin: 3.4 g/dL — ABNORMAL LOW (ref 3.5–5.0)
Alkaline Phosphatase: 121 U/L (ref 38–126)
Anion gap: 8 (ref 5–15)
BUN: 10 mg/dL (ref 8–23)
CO2: 29 mmol/L (ref 22–32)
Calcium: 9.6 mg/dL (ref 8.9–10.3)
Chloride: 103 mmol/L (ref 98–111)
Creatinine: 0.73 mg/dL (ref 0.44–1.00)
GFR, Estimated: >60 mL/min (ref >60)
Glucose, Bld: 104 mg/dL — ABNORMAL HIGH (ref 70–99)
Potassium: 4.3 mmol/L (ref 3.5–5.1)
Sodium: 140 mmol/L (ref 135–145)
Total Bilirubin: 0.6 mg/dL (ref 0.3–1.2)
Total Protein: 8.1 g/dL (ref 6.5–8.1)

## 2019-01-25 MED ORDER — SODIUM CHLORIDE 0.9% FLUSH
10.0000 mL | INTRAVENOUS | Status: DC | PRN
  Administered 2019-01-25: 10 mL
  Filled 2019-01-25: qty 10

## 2019-01-25 MED ORDER — HEPARIN SOD (PORK) LOCK FLUSH 100 UNIT/ML IV SOLN
500.0000 [IU] | Freq: Once | INTRAVENOUS | Status: AC | PRN
  Administered 2019-01-25: 500 [IU]
  Filled 2019-01-25: qty 5

## 2019-01-26 ENCOUNTER — Telehealth: Payer: Self-pay | Admitting: Hematology

## 2019-01-26 ENCOUNTER — Inpatient Hospital Stay: Payer: Medicare Other

## 2019-01-26 VITALS — BP 136/77 | HR 90 | Temp 98.1°F | Resp 18

## 2019-01-26 DIAGNOSIS — C23 Malignant neoplasm of gallbladder: Secondary | ICD-10-CM

## 2019-01-26 DIAGNOSIS — Z5111 Encounter for antineoplastic chemotherapy: Secondary | ICD-10-CM | POA: Diagnosis not present

## 2019-01-26 DIAGNOSIS — Z7189 Other specified counseling: Secondary | ICD-10-CM

## 2019-01-26 LAB — CANCER ANTIGEN 19-9: CA 19-9: 359 U/mL — ABNORMAL HIGH (ref 0–35)

## 2019-01-26 MED ORDER — SODIUM CHLORIDE 0.9 % IV SOLN
Freq: Once | INTRAVENOUS | Status: DC
  Filled 2019-01-26: qty 250

## 2019-01-26 MED ORDER — SODIUM CHLORIDE 0.9 % IV SOLN
1000.0000 mg/m2 | Freq: Once | INTRAVENOUS | Status: AC
  Administered 2019-01-26: 1520 mg via INTRAVENOUS
  Filled 2019-01-26: qty 39.98

## 2019-01-26 MED ORDER — PALONOSETRON HCL INJECTION 0.25 MG/5ML
INTRAVENOUS | Status: AC
  Filled 2019-01-26: qty 5

## 2019-01-26 MED ORDER — SODIUM CHLORIDE 0.9 % IV SOLN
Freq: Once | INTRAVENOUS | Status: AC
  Administered 2019-01-26: 12:00:00 via INTRAVENOUS
  Filled 2019-01-26: qty 250

## 2019-01-26 MED ORDER — ZOLEDRONIC ACID 4 MG/100ML IV SOLN
4.0000 mg | Freq: Once | INTRAVENOUS | Status: AC
  Administered 2019-01-26: 4 mg via INTRAVENOUS
  Filled 2019-01-26: qty 100

## 2019-01-26 MED ORDER — FUROSEMIDE 10 MG/ML IJ SOLN: 20.0000 mg | Freq: Once | INTRAMUSCULAR | Status: DC

## 2019-01-26 MED ORDER — SODIUM CHLORIDE 0.9 % IV SOLN
25.0000 mg/m2 | Freq: Once | INTRAVENOUS | Status: AC
  Administered 2019-01-26: 38 mg via INTRAVENOUS
  Filled 2019-01-26: qty 38

## 2019-01-26 MED ORDER — POTASSIUM CHLORIDE 2 MEQ/ML IV SOLN
Freq: Once | INTRAVENOUS | Status: AC
  Administered 2019-01-26: 10:00:00 via INTRAVENOUS
  Filled 2019-01-26: qty 10

## 2019-01-26 MED ORDER — FUROSEMIDE 10 MG/ML IJ SOLN
INTRAMUSCULAR | Status: AC
  Filled 2019-01-26: qty 2

## 2019-01-26 MED ORDER — PALONOSETRON HCL INJECTION 0.25 MG/5ML
0.2500 mg | Freq: Once | INTRAVENOUS | Status: AC
  Administered 2019-01-26: 0.25 mg via INTRAVENOUS

## 2019-01-26 MED ORDER — FUROSEMIDE 10 MG/ML IJ SOLN
20.0000 mg | Freq: Once | INTRAMUSCULAR | Status: AC
  Administered 2019-01-26: 20 mg via INTRAVENOUS

## 2019-01-26 MED ORDER — HEPARIN SOD (PORK) LOCK FLUSH 100 UNIT/ML IV SOLN
500.0000 [IU] | Freq: Once | INTRAVENOUS | Status: AC | PRN
  Administered 2019-01-26: 500 [IU]
  Filled 2019-01-26: qty 5

## 2019-01-26 MED ORDER — SODIUM CHLORIDE 0.9% FLUSH
10.0000 mL | INTRAVENOUS | Status: DC | PRN
  Administered 2019-01-26: 10 mL
  Filled 2019-01-26: qty 10

## 2019-01-26 MED ORDER — SODIUM CHLORIDE 0.9 % IV SOLN
Freq: Once | INTRAVENOUS | Status: AC
  Administered 2019-01-26: 12:00:00 via INTRAVENOUS
  Filled 2019-01-26: qty 5

## 2019-01-26 NOTE — Patient Instructions (Signed)
[facility] Discharge Instructions for Patients Receiving Chemotherapy  Today you received the following chemotherapy agents Gemcitabine and Cisplatin. Zometa  To help prevent nausea and vomiting after your treatment, we encourage you to take your nausea medication as directed.  If you develop nausea and vomiting that is not controlled by your nausea medication, call the clinic.   BELOW ARE SYMPTOMS THAT SHOULD BE REPORTED IMMEDIATELY:  *FEVER GREATER THAN 100.5 F  *CHILLS WITH OR WITHOUT FEVER  NAUSEA AND VOMITING THAT IS NOT CONTROLLED WITH YOUR NAUSEA MEDICATION  *UNUSUAL SHORTNESS OF BREATH  *UNUSUAL BRUISING OR BLEEDING  TENDERNESS IN MOUTH AND THROAT WITH OR WITHOUT PRESENCE OF ULCERS  *URINARY PROBLEMS  *BOWEL PROBLEMS  UNUSUAL RASH Items with * indicate a potential emergency and should be followed up as soon as possible.  Feel free to call the clinic should you have any questions or concerns. The clinic phone number is [phone].  Please show the [facility] at check-in to the Emergency Department and triage nurse.  Zoledronic Acid injection (Hypercalcemia, Oncology) What is this medicine? ZOLEDRONIC ACID (ZOE le dron ik AS id) lowers the amount of calcium loss from bone. It is used to treat too much calcium in your blood from cancer. It is also used to prevent complications of cancer that has spread to the bone. This medicine may be used for other purposes; ask your health care provider or pharmacist if you have questions. COMMON BRAND NAME(S): Zometa What should I tell my health care provider before I take this medicine? They need to know if you have any of these conditions: -aspirin-sensitive asthma -cancer, especially if you are receiving medicines used to treat cancer -dental disease or wear dentures -infection -kidney disease -receiving corticosteroids like dexamethasone or prednisone -an unusual or allergic reaction to zoledronic  acid, other medicines, foods, dyes, or preservatives -pregnant or trying to get pregnant -breast-feeding How should I use this medicine? This medicine is for infusion into a vein. It is given by a health care professional in a hospital or clinic setting. Talk to your pediatrician regarding the use of this medicine in children. Special care may be needed. Overdosage: If you think you have taken too much of this medicine contact a poison control center or emergency room at once. NOTE: This medicine is only for you. Do not share this medicine with others. What if I miss a dose? It is important not to miss your dose. Call your doctor or health care professional if you are unable to keep an appointment. What may interact with this medicine? -certain antibiotics given by injection -NSAIDs, medicines for pain and inflammation, like ibuprofen or naproxen -some diuretics like bumetanide, furosemide -teriparatide -thalidomide This list may not describe all possible interactions. Give your health care provider a list of all the medicines, herbs, non-prescription drugs, or dietary supplements you use. Also tell them if you smoke, drink alcohol, or use illegal drugs. Some items may interact with your medicine. What should I watch for while using this medicine? Visit your doctor or health care professional for regular checkups. It may be some time before you see the benefit from this medicine. Do not stop taking your medicine unless your doctor tells you to. Your doctor may order blood tests or other tests to see how you are doing. Women should inform their doctor if they wish to become pregnant or think they might be pregnant. There is a potential for serious side effects to an unborn child.  Talk to your health care professional or pharmacist for more information. You should make sure that you get enough calcium and vitamin D while you are taking this medicine. Discuss the foods you eat and the vitamins you  take with your health care professional. Some people who take this medicine have severe bone, joint, and/or muscle pain. This medicine may also increase your risk for jaw problems or a broken thigh bone. Tell your doctor right away if you have severe pain in your jaw, bones, joints, or muscles. Tell your doctor if you have any pain that does not go away or that gets worse. Tell your dentist and dental surgeon that you are taking this medicine. You should not have major dental surgery while on this medicine. See your dentist to have a dental exam and fix any dental problems before starting this medicine. Take good care of your teeth while on this medicine. Make sure you see your dentist for regular follow-up appointments. What side effects may I notice from receiving this medicine? Side effects that you should report to your doctor or health care professional as soon as possible: -allergic reactions like skin rash, itching or hives, swelling of the face, lips, or tongue -anxiety, confusion, or depression -breathing problems -changes in vision -eye pain -feeling faint or lightheaded, falls -jaw pain, especially after dental work -mouth sores -muscle cramps, stiffness, or weakness -redness, blistering, peeling or loosening of the skin, including inside the mouth -trouble passing urine or change in the amount of urine Side effects that usually do not require medical attention (report to your doctor or health care professional if they continue or are bothersome): -bone, joint, or muscle pain -constipation -diarrhea -fever -hair loss -irritation at site where injected -loss of appetite -nausea, vomiting -stomach upset -trouble sleeping -trouble swallowing -weak or tired This list may not describe all possible side effects. Call your doctor for medical advice about side effects. You may report side effects to FDA at 1-800-FDA-1088. Where should I keep my medicine? This drug is given in a  hospital or clinic and will not be stored at home. NOTE: This sheet is a summary. It may not cover all possible information. If you have questions about this medicine, talk to your doctor, pharmacist, or health care provider.  2019 Elsevier/Gold Standard (2014-05-04 14:19:39)

## 2019-01-26 NOTE — Telephone Encounter (Signed)
Per 02/06 los,PET scan already scheduled.

## 2019-01-29 ENCOUNTER — Telehealth: Payer: Self-pay | Admitting: Cardiovascular Disease

## 2019-01-29 MED ORDER — FUROSEMIDE 20 MG PO TABS: 40.0000 mg | ORAL_TABLET | Freq: Every day | ORAL | 3 refills | Status: DC

## 2019-01-29 NOTE — Telephone Encounter (Signed)
New message   Pt c/o medication issue:  1. Name of Medication: furosemide (LASIX) 20 MG tablet  2. How are you currently taking this medication (dosage and times per day)?2 times a day  3. Are you having a reaction (difficulty breathing--STAT)? No   4. What is your medication issue? Prescription needs to be rewritten to 2 day for a 90 day supply

## 2019-01-29 NOTE — Telephone Encounter (Signed)
SPOKE TO PATIENT.  MEDICATION WAS INCREASE ON 12/12/18 - SEE LABS  PATIENT HAS NOT  RECHECKED  "BNP"  PER [facility] LABS - PATIENT AWARE  AND WILL DO SO WHEN SHE RECEIVE MAILED  LABSLIP.   PATIENT AWARE  -RN ESENT RX OF LASIX 40 MG ( TAKING 2 TABLETS OF 20 MG) DAILY

## 2019-02-06 LAB — BRAIN NATRIURETIC PEPTIDE: BNP: 423.6 pg/mL — AB (ref 0.0–100.0)

## 2019-02-06 NOTE — Progress Notes (Signed)
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 Lymph node, biopsy, mediastinal - LYMPH NODE WITH METASTATIC ADENOCARCINOMA. - SEE MICROSCOPIC DESCRIPTION 2. Plaque, coronary artery - CALCIFIED ATHEROSCLEROTIC PLAQUE.    10/14/2018 Miscellaneous    Foundation One:  MSI stable tumor mutation burden 3Muts/mb ERBB2 amplification CCNE1 amplification TP53 mutation(+) CDK6 amplification  HGF amplification     11/13/2018 Cancer Staging    Staging form: Gallbladder, AJCC 8th Edition - Clinical stage from 11/13/2018: Stage IVB (cTX, cN2, pM1) - Signed by Truitt Merle, MD on 12/15/2018    11/17/2018 Imaging    11/17/2018 CT CAP IMPRESSION: 1. 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 This is associated with extensive upper abdominal and retroperitoneal lymphadenopathy, as well as metastatic lymphadenopathy in the posterior mediastinum and left supraclavicular region. There is also a metastatic lesion to the left ischium. 2. 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 3. Aortic atherosclerosis, in addition to left main and 3 vessel coronary artery disease. Status post median sternotomy for CABG including LIMA to the LAD. 4. There are calcifications of the aortic valve. Echocardiographic correlation for evaluation of potential  valvular dysfunction may be warranted if clinically indicated.     11/21/2018 Initial Diagnosis    Gallbladder cancer ([place])    11/27/2018 Pathology Results    11/27/2018 CA19-9 immunohistochemical stain Per request, a CA19-9 immunohistochemical stain was performed at an outside institution revealing positive staining in the tumor cells.     12/15/2018 -  Chemotherapy     first line chemo cisplatin and gemcitabine every 2 weeks on 12/15/18     CURRENT THERAPY  first linechemo cisplatin and gemcitabineevery 2 weekson 12/15/18  INTERVAL HISTORY: Cassie Campbell is a 78 y.o. female who is here for follow-up. Today, she is here with a friend and is doing well. She does not have any complains of chemotherapy. She had some concerns with transportation for chemotherapy tomorrow due to the weather. She experienced some sob yesterday. She is currently taking Lasix. Denies swelling of legs, any pain, or nausea. She has a dental appointment due to pain in a dental implant  Pertinent positives and negatives of review of systems are listed and detailed within the above HPI.  REVIEW OF SYSTEMS:  Constitutional: Denies fevers, chills or abnormal weight loss, (+) dental implant pain  Eyes: Denies blurriness of vision Ears, nose, mouth, throat, and face: Denies mucositis or sore throat Respiratory: Denies cough, dyspnea or wheezes Cardiovascular: Denies palpitation, chest discomfort or lower extremity swelling Gastrointestinal:  Denies nausea, heartburn or change in bowel habits Skin: Denies abnormal skin rashes Lymphatics: Denies new lymphadenopathy or easy bruising Neurological:Denies numbness, tingling or new weaknesses Behavioral/Psych: Mood is stable, no new changes  All other systems were reviewed with the patient and are negative.  MEDICAL HISTORY:  Past Medical History:  Diagnosis Date  . Allergic rhinitis   . Arthritis   .  Asthma    xolair s 8/05 ?11/07; mastered hfa 12/20/08  .  Benign positional vertigo   . CAD (coronary artery disease)    a. 09/2014 NSTEMI s/p LHC with sig 2V dz. dLAD diffusely diseased and not suitable for PCI. unsuccessful RCA PCI d/t heavy calcifications  . GERD (gastroesophageal reflux disease)   . Heart attack ([place])    09/2014  . Hyperlipidemia    <130 ldl pos fm hx, bp  . Hypertension   . Osteopenia    dexa 08/22/07 AP spine + 1.1, left femur -1.3, right femur -.8; dexa 10/06/09 +1.6, left femur =1.6, right femur -.  . PONV (postoperative nausea and vomiting)   . Ruptured disc, cervical   . 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 BREAST SURGERY  10-26-10   Rt. breast bx--for microcalcifications in rt. retroareolar region--dx was hyalinized fibroadenoma  . BUNIONECTOMY Bilateral   . CARDIAC CATHETERIZATION     2015  . CATARACT EXTRACTION Right 02/02/2018   Dr Satira Sark  . CATARACT EXTRACTION Left 03/30/2018  . [place] SURGERY  12/01  . CORONARY ARTERY BYPASS GRAFT N/A 10/04/2018   Procedure: CORONARY ARTERY BYPASS GRAFTING (CABG) times 2 using left  Internal mammary artery to LAD, left greater saphenous vein - open harvest.;  Surgeon: [name], MD;  Location: [place];  Service: Open Heart Surgery;  Laterality: N/A;  . IR IMAGING GUIDED PORT INSERTION  12/08/2018  . LEFT HEART CATH AND CORONARY ANGIOGRAPHY N/A 09/27/2018   Procedure: LEFT HEART CATH AND CORONARY ANGIOGRAPHY;  Surgeon: Troy Sine, MD;  Location: [place] CV [facility];  Service: Cardiovascular;  Laterality: N/A;  . LEFT HEART CATHETERIZATION WITH CORONARY ANGIOGRAM N/A 10/16/2014   Procedure: LEFT HEART CATHETERIZATION WITH CORONARY ANGIOGRAM;  Surgeon: Blane Ohara, MD;  Location[facility] C[facility] LAB;  Service: Cardiovascular;  Laterality: N/A;  . Left L4-5 transforaminal lumbar interbody fusion with Depuy cage, rods and screws, local and allograft bone graft,  Vivigen; bilateral decompression/partial hemilaminectomy lumbar five-sacral one  07/2017  . PERCUTANEOUS CORONARY ROTOBLATOR INTERVENTION (PCI-R) N/A 10/17/2014   Procedure: PERCUTANEOUS CORONARY ROTOBLATOR INTERVENTION (PCI-R);  Surgeon: Troy Sine, MD;  Location: [facility] C[facility] LAB;  Service: Cardiovascular;  Laterality: N/A;  . TEE WITHOUT CARDIOVERSION N/A 10/04/2018   Procedure: TRANSESOPHAGEAL ECHOCARDIOGRAM (TEE);  Surgeon: [name], MD;  Location: [place];  Service: Open Heart Surgery;  Laterality: N/A;  . VEIN LIGATION AND STRIPPING Left   . VIDEO BRONCHOSCOPY Bilateral 02/05/2015   Procedure: VIDEO BRONCHOSCOPY WITHOUT FLUORO;  Surgeon: Tanda Rockers, MD;  Location: [facility];  Service: Endoscopy;  Laterality: Bilateral;  . VULVA /PERINEUM BIOPSY  12-30-10   --epidermoid cyst    I have reviewed the social history and family history with the patient and they are unchanged from previous note.  ALLERGIES:  is allergic to fish-derived products; penicillins; aspirin; brilinta [ticagrelor]; codeine phosphate; and diphenhydramine hcl.  MEDICATIONS:  Current Outpatient Medications  Medication Sig Dispense Refill  . albuterol (PROAIR HFA) 108 (90 Base) MCG/ACT inhaler INHALE 1 TO 2 PUFFS BY MOUTH EVERY 4 HOURS AS NEEDED FOR WHEEZING 8.5 g 3  . atorvastatin (LIPITOR) 80 MG tablet TAKE 1 TABLET(80 MG) BY MOUTH DAILY 90 tablet 1  . bisoprolol (ZEBETA) 10 MG tablet Take 1 tablet (10 mg total) by mouth daily. 90 tablet 1  . celecoxib (CELEBREX) 200 MG capsule Take 200  mg by mouth 2 (two) times daily as needed.    . cetirizine (ZYRTEC) 10 MG tablet Take 10 mg by mouth at bedtime as needed for allergies.    . cholecalciferol 2000 units TABS Take 1 tablet (2,000 Units total) by mouth daily. 30 tablet 3  . clopidogrel (PLAVIX) 75 MG tablet Take 1 tablet (75 mg total) by mouth daily. 90 tablet 3  . dextromethorphan-guaiFENesin (MUCINEX DM) 30-600 MG per 12 hr tablet Take 1-2 tablets by  mouth every 12 (twelve) hours as needed for cough (with flutter).     . famotidine (PEPCID) 20 MG tablet Take 20 mg by mouth at bedtime.    . ferrous sulfate 325 (65 FE) MG tablet Take 325 mg by mouth 2 (two) times daily with a meal.    . fluticasone (FLONASE) 50 MCG/ACT nasal spray Place 1-2 sprays into both nostrils 2 (two) times daily as needed for allergies or rhinitis.    . furosemide (LASIX) 20 MG tablet Take 2 tablets (40 mg total) by mouth daily. 180 tablet 3  . HYDROcodone-acetaminophen (NORCO/VICODIN) 5-325 MG tablet Take 1 tablet by mouth every 6 (six) hours as needed for moderate pain. 30 tablet 0  . lidocaine-prilocaine (EMLA) cream Apply to affected area once 30 g 3  . magnesium oxide (MAG-OX) 400 (241.3 Mg) MG tablet Take 1 tablet (400 mg total) by mouth daily. 90 tablet 1  . meclizine (ANTIVERT) 25 MG tablet TAKE 2 TABLETS BY MOUTH THREE TIMES DAILY AS NEEDED 24 tablet 2  . montelukast (SINGULAIR) 10 MG tablet TAKE 1 TABLET(10 MG) BY MOUTH AT BEDTIME 90 tablet 0  . Multiple Vitamin (MULTIVITAMIN) capsule Take 1 capsule by mouth daily.     Marland Kitchen omeprazole (PRILOSEC) 20 MG capsule Take 20 mg by mouth daily.    . ondansetron (ZOFRAN) 8 MG tablet Take 1 tablet (8 mg total) by mouth 2 (two) times daily as needed. Start on the third day after chemotherapy. 30 tablet 1  . potassium chloride SA (K-DUR,KLOR-CON) 20 MEQ tablet Take 1 tablet (20 mEq total) by mouth 2 (two) times daily. 60 tablet 2  . prochlorperazine (COMPAZINE) 10 MG tablet Take 1 tablet (10 mg total) by mouth every 6 (six) hours as needed (Nausea or vomiting). 30 tablet 1  . SYMBICORT 160-4.5 MCG/ACT inhaler INHALE 2 PUFFS BY MOUTH EVERY 12 HOURS 10.2 g 11   No current facility-administered medications for this visit.     PHYSICAL EXAMINATION: ECOG PERFORMANCE STATUS: 0 - Asymptomatic  Vitals:   02/08/19 1303  BP: 135/76  Pulse: 84  Resp: 17  Temp: 97.7 F (36.5 C)  SpO2: 100%   Filed Weights   02/08/19 1303    Weight: 120 lb 6.4 oz (54.6 kg)    GENERAL:alert, no distress and comfortable SKIN: skin color, texture, turgor are normal, no rashes or significant lesions EYES: normal, Conjunctiva are pink and non-injected, sclera clear OROPHARYNX:no exudate, no erythema and lips, buccal mucosa, and tongue normal  NECK: supple, thyroid normal size, non-tender, without nodularity LYMPH:  no palpable lymphadenopathy in the cervical, axillary or inguinal LUNGS: normal breathing effort, (+) mild statter crackling in the bottom of lungs  HEART: regular rate & rhythm and no murmurs and no lower extremity edema ABDOMEN:abdomen soft, non-tender and normal bowel sounds Musculoskeletal:no cyanosis of digits and no clubbing  NEURO: alert & oriented x 3 with fluent speech, no focal motor/sensory deficits  LABORATORY DATA:  I have reviewed the data as listed CBC Latest Ref  Rng & Units 02/08/2019 01/25/2019 01/11/2019  WBC 4.0 - 10.5 K/uL 6.5 5.6 6.4  Hemoglobin 12.0 - 15.0 g/dL 10.3(L) 10.5(L) 10.4(L)  Hematocrit 36.0 - 46.0 % 33.1(L) 33.2(L) 33.3(L)  Platelets 150 - 400 K/uL 262 262 339     CMP Latest Ref Rng & Units 02/08/2019 01/25/2019 01/11/2019  Glucose 70 - 99 mg/dL 82 104(H) 131(H)  BUN 8 - 23 mg/dL 10 10 13   Creatinine 0.44 - 1.00 mg/dL 0.80 0.73 0.72  Sodium 135 - 145 mmol/L 141 140 140  Potassium 3.5 - 5.1 mmol/L 3.5 4.3 4.5  Chloride 98 - 111 mmol/L 100 103 103  CO2 22 - 32 mmol/L 31 29 27   Calcium 8.9 - 10.3 mg/dL 7.9(L) 9.6 9.7  Total Protein 6.5 - 8.1 g/dL 7.9 8.1 8.5(H)  Total Bilirubin 0.3 - 1.2 mg/dL 0.3 0.6 0.3  Alkaline Phos 38 - 126 U/L 103 121 143(H)  AST 15 - 41 U/L 23 23 32  ALT 0 - 44 U/L 15 18 25       RADIOGRAPHIC STUDIES: I have personally reviewed the radiological images as listed and agreed with the findings in the report. No results found.   ASSESSMENT & PLAN:  Cassie Campbell is a 78 y.o. 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 Tolerating well. - Her Foundation One genomic testing results showed MSI stable disease, low tumor mutation burden,notargetable mutation such asFGFR fusion or IDH mutation, but (+) HER2 amplification, may try Herceptin in the future. -will monitor CA19.9 every 4 weeks. It has been trending down significantly, she is likely having good response to chemo.  -Repeat restaging PET or CT scan after 6 cycle of treatment scheduled on 02/20/2019. -Patient is tolerating chemotherapy very well, with only mild fatigue.   - Lab reviewed, mild anemia stable, normal WBC and platelet count, CMP unremarkable, adequate for treatment -Return tomorrow for cycle 4 cisplatin and gemcitabine.   2. CAD, S/P CABGX2 on 09/27/2018, EF 40-45% -Currently on Plavix. She also takes lasix during chemo. -f/u with Dr. Claiborne Billings -stable, she appears to be euvolemic   3. Asthma -Currently on Symbicort, Singulair, and albuterol as needed -f/u with pulmonary, stable   4.Goal of care discussion -The patient understands the goal of care is palliative. -she is full code for now  5.Hypokalemia -Likely secondary to lasix. Currently on KCL 79mq once daily -Labs reviewed, K normal  6. Mild Anemia -Secondary to chemoand her recent open heart surgery -Labs reviewed, H/H stable, no need blood transfixion    Plan  -Lab reviewed, adequate for treatment, she will return tomorrow for cycle 4 cisplatin and gemcitabine.  -I spoke with AGinette Ottoand she arranged a transportation for her to come in for treatment tomorrow morning  - I refilled magnesium oxide  - F/u in 2 weeks, with restaging PET a few days before    No problem-specific Assessment & Plan notes found for this encounter.   No  orders of the defined types were placed in this encounter.  All questions were answered. The patient knows to call the clinic with any problems, questions or concerns. No barriers to learning was detected. I spent 20 minutes counseling the patient face to face. The total time spent in the appointment was 25 minutes and more than  50% was on counseling and review of test results  I, Manson Allan am acting as scribe for Dr. Truitt Merle.  I have reviewed the above documentation for accuracy and completeness, and I agree with the above.     Truitt Merle, MD 02/08/2019

## 2019-02-08 ENCOUNTER — Inpatient Hospital Stay: Payer: Medicare Other

## 2019-02-08 ENCOUNTER — Encounter: Payer: Self-pay | Admitting: Hematology

## 2019-02-08 ENCOUNTER — Inpatient Hospital Stay (HOSPITAL_BASED_OUTPATIENT_CLINIC_OR_DEPARTMENT_OTHER): Payer: Medicare Other | Admitting: Hematology

## 2019-02-08 VITALS — BP 135/76 | HR 84 | Temp 97.7°F | Resp 17 | Ht 59.0 in | Wt 120.4 lb

## 2019-02-08 DIAGNOSIS — C23 Malignant neoplasm of gallbladder: Secondary | ICD-10-CM

## 2019-02-08 DIAGNOSIS — I2510 Atherosclerotic heart disease of native coronary artery without angina pectoris: Secondary | ICD-10-CM

## 2019-02-08 DIAGNOSIS — C7951 Secondary malignant neoplasm of bone: Secondary | ICD-10-CM | POA: Diagnosis not present

## 2019-02-08 DIAGNOSIS — D6481 Anemia due to antineoplastic chemotherapy: Secondary | ICD-10-CM

## 2019-02-08 DIAGNOSIS — J45909 Unspecified asthma, uncomplicated: Secondary | ICD-10-CM

## 2019-02-08 DIAGNOSIS — E876 Hypokalemia: Secondary | ICD-10-CM

## 2019-02-08 DIAGNOSIS — Z5111 Encounter for antineoplastic chemotherapy: Secondary | ICD-10-CM | POA: Diagnosis not present

## 2019-02-08 DIAGNOSIS — Z95828 Presence of other vascular implants and grafts: Secondary | ICD-10-CM

## 2019-02-08 LAB — CBC WITH DIFFERENTIAL (CANCER CENTER ONLY)
Abs Immature Granulocytes: 0.02 10*3/uL (ref 0.00–0.07)
Basophils Absolute: 0 10*3/uL (ref 0.0–0.1)
Basophils Relative: 1 %
Eosinophils Absolute: 0.1 10*3/uL (ref 0.0–0.5)
Eosinophils Relative: 2 %
HCT: 33.1 % — ABNORMAL LOW (ref 36.0–46.0)
Hemoglobin: 10.3 g/dL — ABNORMAL LOW (ref 12.0–15.0)
Immature Granulocytes: 0 %
Lymphocytes Relative: 25 %
Lymphs Abs: 1.6 10*3/uL (ref 0.7–4.0)
MCH: 27.3 pg (ref 26.0–34.0)
MCHC: 31.1 g/dL (ref 30.0–36.0)
MCV: 87.8 fL (ref 80.0–100.0)
Monocytes Absolute: 0.7 10*3/uL (ref 0.1–1.0)
Monocytes Relative: 11 %
Neutro Abs: 4 10*3/uL (ref 1.7–7.7)
Neutrophils Relative %: 61 %
Platelet Count: 262 10*3/uL (ref 150–400)
RBC: 3.77 MIL/uL — AB (ref 3.87–5.11)
RDW: 22.6 % — ABNORMAL HIGH (ref 11.5–15.5)
WBC: 6.5 10*3/uL (ref 4.0–10.5)
nRBC: 0.5 % — ABNORMAL HIGH (ref 0.0–0.2)

## 2019-02-08 LAB — CMP (CANCER CENTER ONLY)
ALBUMIN: 3.3 g/dL — AB (ref 3.5–5.0)
ALK PHOS: 103 U/L (ref 38–126)
ALT: 15 U/L (ref 0–44)
ANION GAP: 10 (ref 5–15)
AST: 23 U/L (ref 15–41)
BUN: 10 mg/dL (ref 8–23)
CO2: 31 mmol/L (ref 22–32)
Calcium: 7.9 mg/dL — ABNORMAL LOW (ref 8.9–10.3)
Chloride: 100 mmol/L (ref 98–111)
Creatinine: 0.8 mg/dL (ref 0.44–1.00)
GFR, Est AFR Am: >60 mL/min (ref >60)
GFR, Estimated: >60 mL/min (ref >60)
GLUCOSE: 82 mg/dL (ref 70–99)
Potassium: 3.5 mmol/L (ref 3.5–5.1)
SODIUM: 141 mmol/L (ref 135–145)
Total Bilirubin: 0.3 mg/dL (ref 0.3–1.2)
Total Protein: 7.9 g/dL (ref 6.5–8.1)

## 2019-02-08 MED ORDER — HEPARIN SOD (PORK) LOCK FLUSH 100 UNIT/ML IV SOLN
500.0000 [IU] | Freq: Once | INTRAVENOUS | Status: AC | PRN
  Administered 2019-02-08: 500 [IU]
  Filled 2019-02-08: qty 5

## 2019-02-08 MED ORDER — HEPARIN SOD (PORK) LOCK FLUSH 100 UNIT/ML IV SOLN
500.0000 [IU] | Freq: Once | INTRAVENOUS | Status: DC
  Filled 2019-02-08: qty 5

## 2019-02-08 MED ORDER — MAGNESIUM OXIDE 400 (241.3 MG) MG PO TABS: 400.0000 mg | ORAL_TABLET | Freq: Every day | ORAL | 1 refills | Status: DC

## 2019-02-08 MED ORDER — SODIUM CHLORIDE 0.9% FLUSH
10.0000 mL | Freq: Once | INTRAVENOUS | Status: DC | PRN
  Filled 2019-02-08: qty 10

## 2019-02-08 MED ORDER — SODIUM CHLORIDE 0.9% FLUSH
10.0000 mL | INTRAVENOUS | Status: DC | PRN
  Administered 2019-02-08: 10 mL via INTRAVENOUS
  Filled 2019-02-08: qty 10

## 2019-02-09 ENCOUNTER — Inpatient Hospital Stay: Payer: Medicare Other

## 2019-02-09 ENCOUNTER — Telehealth: Payer: Self-pay | Admitting: Hematology

## 2019-02-09 VITALS — BP 124/81 | HR 87 | Temp 98.2°F | Resp 17

## 2019-02-09 DIAGNOSIS — C23 Malignant neoplasm of gallbladder: Secondary | ICD-10-CM

## 2019-02-09 DIAGNOSIS — Z7189 Other specified counseling: Secondary | ICD-10-CM

## 2019-02-09 DIAGNOSIS — Z5111 Encounter for antineoplastic chemotherapy: Secondary | ICD-10-CM | POA: Diagnosis not present

## 2019-02-09 MED ORDER — SODIUM CHLORIDE 0.9 % IV SOLN
25.0000 mg/m2 | Freq: Once | INTRAVENOUS | Status: AC
  Administered 2019-02-09: 38 mg via INTRAVENOUS
  Filled 2019-02-09: qty 38

## 2019-02-09 MED ORDER — POTASSIUM CHLORIDE 2 MEQ/ML IV SOLN
Freq: Once | INTRAVENOUS | Status: AC
  Administered 2019-02-09: 09:00:00 via INTRAVENOUS
  Filled 2019-02-09: qty 10

## 2019-02-09 MED ORDER — FUROSEMIDE 10 MG/ML IJ SOLN
20.0000 mg | Freq: Once | INTRAMUSCULAR | Status: AC
  Administered 2019-02-09: 20 mg via INTRAVENOUS

## 2019-02-09 MED ORDER — SODIUM CHLORIDE 0.9 % IV SOLN
1000.0000 mg/m2 | Freq: Once | INTRAVENOUS | Status: AC
  Administered 2019-02-09: 1520 mg via INTRAVENOUS
  Filled 2019-02-09: qty 39.98

## 2019-02-09 MED ORDER — HEPARIN SOD (PORK) LOCK FLUSH 100 UNIT/ML IV SOLN
500.0000 [IU] | Freq: Once | INTRAVENOUS | Status: AC | PRN
  Administered 2019-02-09: 500 [IU]
  Filled 2019-02-09: qty 5

## 2019-02-09 MED ORDER — PALONOSETRON HCL INJECTION 0.25 MG/5ML
INTRAVENOUS | Status: AC
  Filled 2019-02-09: qty 5

## 2019-02-09 MED ORDER — PALONOSETRON HCL INJECTION 0.25 MG/5ML
0.2500 mg | Freq: Once | INTRAVENOUS | Status: AC
  Administered 2019-02-09: 0.25 mg via INTRAVENOUS

## 2019-02-09 MED ORDER — SODIUM CHLORIDE 0.9 % IV SOLN
Freq: Once | INTRAVENOUS | Status: AC
  Administered 2019-02-09: 08:00:00 via INTRAVENOUS
  Filled 2019-02-09: qty 250

## 2019-02-09 MED ORDER — SODIUM CHLORIDE 0.9% FLUSH
10.0000 mL | INTRAVENOUS | Status: DC | PRN
  Administered 2019-02-09: 10 mL
  Filled 2019-02-09: qty 10

## 2019-02-09 MED ORDER — SODIUM CHLORIDE 0.9 % IV SOLN
Freq: Once | INTRAVENOUS | Status: AC
  Administered 2019-02-09: 11:00:00 via INTRAVENOUS
  Filled 2019-02-09: qty 5

## 2019-02-09 MED ORDER — FUROSEMIDE 10 MG/ML IJ SOLN
INTRAMUSCULAR | Status: AC
  Filled 2019-02-09: qty 2

## 2019-02-09 MED ORDER — SODIUM CHLORIDE 0.9 % IV SOLN
Freq: Once | INTRAVENOUS | Status: AC
  Administered 2019-02-09: 11:00:00 via INTRAVENOUS
  Filled 2019-02-09: qty 250

## 2019-02-09 NOTE — Patient Instructions (Signed)
[facility] Discharge Instructions for Patients Receiving Chemotherapy  Today you received the following chemotherapy agents Gemzar and Cisplatin  To help prevent nausea and vomiting after your treatment, we encourage you to take your nausea medication as directed   If you develop nausea and vomiting that is not controlled by your nausea medication, call the clinic.   BELOW ARE SYMPTOMS THAT SHOULD BE REPORTED IMMEDIATELY:  *FEVER GREATER THAN 100.5 F  *CHILLS WITH OR WITHOUT FEVER  NAUSEA AND VOMITING THAT IS NOT CONTROLLED WITH YOUR NAUSEA MEDICATION  *UNUSUAL SHORTNESS OF BREATH  *UNUSUAL BRUISING OR BLEEDING  TENDERNESS IN MOUTH AND THROAT WITH OR WITHOUT PRESENCE OF ULCERS  *URINARY PROBLEMS  *BOWEL PROBLEMS  UNUSUAL RASH Items with * indicate a potential emergency and should be followed up as soon as possible.  Feel free to call the clinic should you have any questions or concerns. The clinic phone number is [phone].  Please show the CHEMO ALERT CARD at check-in to the Emergency Department and triage nurse.   

## 2019-02-09 NOTE — Telephone Encounter (Signed)
No los per 2/21.

## 2019-02-17 ENCOUNTER — Other Ambulatory Visit: Payer: Self-pay | Admitting: Internal Medicine

## 2019-02-20 ENCOUNTER — Ambulatory Visit (HOSPITAL_COMMUNITY)
Admission: RE | Admit: 2019-02-20 | Discharge: 2019-02-20 | Disposition: A | Discharge status: Home / Self Care | Payer: Medicare Other | Source: Ambulatory Visit | Attending: Hematology | Admitting: Hematology

## 2019-02-20 DIAGNOSIS — C23 Malignant neoplasm of gallbladder: Secondary | ICD-10-CM | POA: Diagnosis not present

## 2019-02-20 LAB — GLUCOSE, CAPILLARY: Glucose-Capillary: 104 mg/dL — ABNORMAL HIGH (ref 70–99)

## 2019-02-20 MED ORDER — FLUDEOXYGLUCOSE F - 18 (FDG) INJECTION
6.3900 | Freq: Once | INTRAVENOUS | Status: AC | PRN
  Administered 2019-02-20: 6.39 via INTRAVENOUS

## 2019-02-21 NOTE — Progress Notes (Signed)
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 Lymph node, biopsy, mediastinal - LYMPH NODE WITH METASTATIC ADENOCARCINOMA. - SEE MICROSCOPIC DESCRIPTION 2. Plaque, coronary artery - CALCIFIED ATHEROSCLEROTIC PLAQUE.    10/14/2018 Miscellaneous    Foundation One:  MSI stable tumor mutation burden 3Muts/mb ERBB2 amplification CCNE1 amplification TP53 mutation(+) CDK6 amplification  HGF amplification     11/13/2018 Cancer Staging    Staging form: Gallbladder, AJCC 8th Edition - Clinical stage from 11/13/2018: Stage IVB (cTX, cN2, pM1) - Signed by Truitt Merle, MD on 12/15/2018    11/17/2018 Imaging    11/17/2018 CT CAP IMPRESSION: 1. 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 This is associated with extensive upper abdominal and retroperitoneal lymphadenopathy, as well as metastatic lymphadenopathy in the posterior mediastinum and left supraclavicular region. There is also a metastatic lesion to the left ischium. 2. 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 3. Aortic atherosclerosis, in addition to left main and 3 vessel coronary artery disease. Status post median sternotomy for CABG including LIMA to the LAD. 4. There are calcifications of the aortic valve. Echocardiographic correlation for evaluation of  potential valvular dysfunction may be warranted if clinically indicated.     11/21/2018 Initial Diagnosis    Gallbladder cancer ([place])    11/27/2018 Pathology Results    11/27/2018 CA19-9 immunohistochemical stain Per request, a CA19-9 immunohistochemical stain was performed at an outside institution revealing positive staining in the tumor cells.     12/15/2018 -  Chemotherapy     first line chemo cisplatin and gemcitabine every 2 weeks on 12/15/18    02/20/2019 PET scan    Restaging PET scan:  IMPRESSION: 1. Positive response to therapy at all primary and metastatic sites. Persistent primary and metastatic lesions do have intense hypermetabolic activity albeit reduced. 2. Decrease in size and hypermetabolic activity of mass lesion in the gallbladder fundus. 3. Interval decrease in size and metabolic activity of periportal adenopathy. 4. Decrease in size and metabolic activity of periaortic lymph nodes. 5. Interval decrease in size and metabolic activity of destructive lesion in the LEFT inferior pubic ramus with new sclerosis. 6. No new or progressive disease.      CURRENT THERAPY:   first linechemo cisplatin and gemcitabineevery 2 weekson 12/15/18   INTERVAL HISTORY:  Cassie Campbell is here for a follow up of metastatic gallbladder cancer. She is accompanied by a friend today.   She notes that she feels fatigued and slightly winded for 1-2 days following treatment. She is able to continue with her daily activities.   Since her last visit to the office, she underwent a restaging PET scan on 02/20/2019 that showed: Positive response to therapy at all primary and metastatic sites. Persistent primary and metastatic lesions do have intense hypermetabolic activity albeit reduced. Decrease in size and hypermetabolic activity of mass lesion in the gallbladder fundus.  Interval decrease in size and metabolic activity of periportal adenopathy. Decrease in size and metabolic activity  of periaortic lymph nodes. Interval decrease in size and metabolic activity of destructive lesion in the LEFT inferior pubic ramus with new sclerosis. No new or progressive disease.  She reports fatigue, slightly winded. she denies any other symptoms. Pertinent positives are listed and detailed within the above HPI.   REVIEW OF SYSTEMS:   Constitutional: Denies fevers, chills or abnormal weight loss (+) fatigue, (+) slightly winded Eyes: Denies blurriness of vision Ears, nose, mouth, throat, and face: Denies mucositis or sore throat Respiratory: Denies cough, dyspnea or wheezes Cardiovascular: Denies palpitation, chest discomfort or lower extremity swelling Gastrointestinal:  Denies nausea, heartburn or change in bowel habits Skin: Denies abnormal skin rashes Lymphatics: Denies new lymphadenopathy or easy bruising Neurological:Denies numbness, tingling or new weaknesses Behavioral/Psych: Mood is stable, no new changes  All other systems were reviewed with the patient and are negative.  MEDICAL HISTORY:  Past Medical History:  Diagnosis Date  . Allergic rhinitis   . Arthritis   . Asthma    xolair s 8/05 ?11/07; mastered hfa 12/20/08  . Benign positional vertigo   . CAD (coronary artery disease)    a. 09/2014 NSTEMI s/p LHC with sig 2V dz. dLAD diffusely diseased and not suitable for PCI. unsuccessful RCA PCI d/t heavy calcifications  . GERD (gastroesophageal reflux disease)   . Heart attack ([place])    09/2014  . Hyperlipidemia    <130 ldl pos fm hx, bp  . Hypertension   . Osteopenia    dexa 08/22/07 AP spine + 1.1, left femur -1.3, right femur -.8; dexa 10/06/09 +1.6, left femur =1.6, right femur -.  . PONV (postoperative nausea and vomiting)   . Ruptured disc, cervical   . 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 BREAST SURGERY  10-26-10   Rt. breast bx--for microcalcifications in  rt. retroareolar region--dx was hyalinized fibroadenoma  . BUNIONECTOMY Bilateral   . CARDIAC CATHETERIZATION     2015  . CATARACT EXTRACTION Right 02/02/2018   Dr Satira Sark  . CATARACT EXTRACTION Left 03/30/2018  . [place] SURGERY  12/01  . CORONARY ARTERY BYPASS GRAFT N/A 10/04/2018   Procedure: CORONARY ARTERY BYPASS GRAFTING (CABG) times 2 using left  Internal mammary artery to LAD, left greater saphenous vein - open harvest.;  Surgeon: [name], MD;  Location: [place];  Service: Open Heart Surgery;  Laterality: N/A;  . IR IMAGING GUIDED PORT INSERTION  12/08/2018  . LEFT HEART CATH AND CORONARY ANGIOGRAPHY N/A 09/27/2018   Procedure: LEFT HEART CATH AND CORONARY ANGIOGRAPHY;  Surgeon: Troy Sine, MD;  Location: [place] CV LAB;  Service: Cardiovascular;  Laterality: N/A;  . LEFT HEART CATHETERIZATION WITH CORONARY ANGIOGRAM N/A 10/16/2014   Procedure: LEFT HEART CATHETERIZATION WITH CORONARY ANGIOGRAM;  Surgeon: Blane Ohara, MD;  Location[facility] CATH LAB;  Service: Cardiovascular;  Laterality: N/A;  . Left L4-5 transforaminal lumbar interbody fusion with Depuy cage, rods and screws, local and allograft bone graft, Vivigen; bilateral decompression/partial hemilaminectomy lumbar five-sacral one  07/2017  . PERCUTANEOUS CORONARY ROTOBLATOR INTERVENTION (PCI-R) N/A 10/17/2014   Procedure: PERCUTANEOUS CORONARY ROTOBLATOR INTERVENTION (PCI-R);  Surgeon: Troy Sine, MD;  Location: [facility] CATH LAB;  Service: Cardiovascular;  Laterality: N/A;  . TEE WITHOUT CARDIOVERSION N/A 10/04/2018   Procedure: TRANSESOPHAGEAL ECHOCARDIOGRAM (TEE);  Surgeon: Modesto Charon  C, MD;  Location: [place];  Service: Open Heart Surgery;  Laterality: N/A;  . VEIN LIGATION AND STRIPPING Left   . VIDEO BRONCHOSCOPY Bilateral 02/05/2015   Procedure: VIDEO BRONCHOSCOPY WITHOUT FLUORO;  Surgeon: Tanda Rockers, MD;  Location: [facility];  Service: Endoscopy;  Laterality: Bilateral;  . VULVA  /PERINEUM BIOPSY  12-30-10   --epidermoid cyst    I have reviewed the social history and family history with the patient and they are unchanged from previous note.  ALLERGIES:  is allergic to fish-derived products; penicillins; aspirin; brilinta [ticagrelor]; codeine phosphate; and diphenhydramine hcl.  MEDICATIONS:  Current Outpatient Medications  Medication Sig Dispense Refill  . albuterol (PROAIR HFA) 108 (90 Base) MCG/ACT inhaler INHALE 1 TO 2 PUFFS BY MOUTH EVERY 4 HOURS AS NEEDED FOR WHEEZING 8.5 g 3  . atorvastatin (LIPITOR) 80 MG tablet TAKE 1 TABLET(80 MG) BY MOUTH DAILY 90 tablet 1  . bisoprolol (ZEBETA) 10 MG tablet Take 1 tablet (10 mg total) by mouth daily. 90 tablet 1  . celecoxib (CELEBREX) 200 MG capsule Take 200 mg by mouth 2 (two) times daily as needed.    . cetirizine (ZYRTEC) 10 MG tablet Take 10 mg by mouth at bedtime as needed for allergies.    . cholecalciferol 2000 units TABS Take 1 tablet (2,000 Units total) by mouth daily. 30 tablet 3  . clopidogrel (PLAVIX) 75 MG tablet Take 1 tablet (75 mg total) by mouth daily. 90 tablet 3  . dextromethorphan-guaiFENesin (MUCINEX DM) 30-600 MG per 12 hr tablet Take 1-2 tablets by mouth every 12 (twelve) hours as needed for cough (with flutter).     . famotidine (PEPCID) 20 MG tablet Take 20 mg by mouth at bedtime.    . ferrous sulfate 325 (65 FE) MG tablet Take 325 mg by mouth 2 (two) times daily with a meal.    . fluticasone (FLONASE) 50 MCG/ACT nasal spray Place 1-2 sprays into both nostrils 2 (two) times daily as needed for allergies or rhinitis.    . furosemide (LASIX) 20 MG tablet Take 2 tablets (40 mg total) by mouth daily. 180 tablet 3  . HYDROcodone-acetaminophen (NORCO/VICODIN) 5-325 MG tablet Take 1 tablet by mouth every 6 (six) hours as needed for moderate pain. 30 tablet 0  . lidocaine-prilocaine (EMLA) cream Apply to affected area once 30 g 3  . magnesium oxide (MAG-OX) 400 (241.3 Mg) MG tablet Take 1 tablet (400 mg  total) by mouth daily. 90 tablet 1  . meclizine (ANTIVERT) 25 MG tablet TAKE 2 TABLETS BY MOUTH THREE TIMES DAILY AS NEEDED 24 tablet 2  . montelukast (SINGULAIR) 10 MG tablet TAKE 1 TABLET(10 MG) BY MOUTH AT BEDTIME 90 tablet 0  . Multiple Vitamin (MULTIVITAMIN) capsule Take 1 capsule by mouth daily.     Marland Kitchen omeprazole (PRILOSEC) 20 MG capsule Take 20 mg by mouth daily.    . ondansetron (ZOFRAN) 8 MG tablet Take 1 tablet (8 mg total) by mouth 2 (two) times daily as needed. Start on the third day after chemotherapy. 30 tablet 1  . potassium chloride SA (K-DUR,KLOR-CON) 20 MEQ tablet Take 1 tablet (20 mEq total) by mouth 2 (two) times daily. 60 tablet 2  . prochlorperazine (COMPAZINE) 10 MG tablet Take 1 tablet (10 mg total) by mouth every 6 (six) hours as needed (Nausea or vomiting). 30 tablet 1  . SYMBICORT 160-4.5 MCG/ACT inhaler INHALE 2 PUFFS BY MOUTH EVERY 12 HOURS 10.2 g 11   No current facility-administered medications for  this visit.     PHYSICAL EXAMINATION: ECOG PERFORMANCE STATUS: 1 - Symptomatic but completely ambulatory  Vitals:   02/22/19 1304  BP: 123/71  Pulse: 81  Resp: 18  Temp: 98.2 F (36.8 C)  SpO2: 100%   Filed Weights   02/22/19 1304  Weight: 120 lb 6.4 oz (54.6 kg)    GENERAL:alert, no distress and comfortable SKIN: skin color, texture, turgor are normal, no rashes or significant lesions EYES: normal, Conjunctiva are pink and non-injected, sclera clear OROPHARYNX:no exudate, no erythema and lips, buccal mucosa, and tongue normal  NECK: supple, thyroid normal size, non-tender, without nodularity LYMPH:  no palpable lymphadenopathy in the cervical, axillary or inguinal LUNGS: clear to auscultation and percussion with normal breathing effort HEART: regular rate & rhythm and no murmurs and no lower extremity edema ABDOMEN:abdomen soft, non-tender and normal bowel sounds Musculoskeletal:no cyanosis of digits and no clubbing  NEURO: alert & oriented x 3 with  fluent speech, no focal motor/sensory deficits  LABORATORY DATA:  I have reviewed the data as listed CBC Latest Ref Rng & Units 02/22/2019 02/08/2019 01/25/2019  WBC 4.0 - 10.5 K/uL 5.6 6.5 5.6  Hemoglobin 12.0 - 15.0 g/dL 10.9(L) 10.3(L) 10.5(L)  Hematocrit 36.0 - 46.0 % 34.2(L) 33.1(L) 33.2(L)  Platelets 150 - 400 K/uL 252 262 262     CMP Latest Ref Rng & Units 02/22/2019 02/08/2019 01/25/2019  Glucose 70 - 99 mg/dL 80 82 104(H)  BUN 8 - 23 mg/dL 12 10 10   Creatinine 0.44 - 1.00 mg/dL 0.80 0.80 0.73  Sodium 135 - 145 mmol/L 138 141 140  Potassium 3.5 - 5.1 mmol/L 4.2 3.5 4.3  Chloride 98 - 111 mmol/L 104 100 103  CO2 22 - 32 mmol/L 25 31 29   Calcium 8.9 - 10.3 mg/dL 8.5(L) 7.9(L) 9.6  Total Protein 6.5 - 8.1 g/dL 8.2(H) 7.9 8.1  Total Bilirubin 0.3 - 1.2 mg/dL 0.3 0.3 0.6  Alkaline Phos 38 - 126 U/L 104 103 121  AST 15 - 41 U/L 22 23 23   ALT 0 - 44 U/L 15 15 18       RADIOGRAPHIC STUDIES: I have personally reviewed the radiological images as listed and agreed with the findings in the report. No results found.   ASSESSMENT & PLAN:  ABISAI COBLE is a 78 y.o. 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 Tolerating well. -[employer] testing results showed MSI stable disease, low tumor mutation burden,notargetable mutation such asFGFR fusion or IDH mutation, but (+) HER2 amplification, may try Herceptin in the future. -will monitor CA19.9 every 4 weeks. It has been trending down significantly, she is likely having good response to chemo.  -Restaging PET scan on 02/20/2019 showed excellent partial response, no new or progressive disease.  I reviewed the images and compared to previous one with pt in person today  -Patient  tolerating chemotherapy very well with mild fatigue for 1-2 days following treatment.  -Labs reviewed, mild anemia stable and slightly improving. Nl WBC and PLT count. CMP unremarkable, adequate for treatment. -Due to the patient tolerating chemotherapy well, then the patient will be able to follow up every other treatment instead of every 2 weeks.  -We will arrange morning transportation for the patient for her Friday treatments.   2. CAD, S/P CABGX2 on  09/27/2018, EF 40-45% -Currently on Plavix. She also takes lasix during chemo. -f/u with Dr. Claiborne Billings -stable,she appears to be euvolemic  3. Asthma -Currently on Symbicort, Singulair, and albuterol as needed -f/u with pulmonary, stable  4.Goal of care discussion -The patient understands the goal of care is palliative. -she is full code for now  5.Hypokalemia -Likely secondary to lasix. Currently on KCL 1mq once daily -K 4.2 today   6. Mild Anemia -Secondary to chemoand her recent open heart surgery -Labs reviewed, H/H stable, no need blood transfusion    Plan  -restaging PET reviewed, she has had excellent partial response.   Lab reviewed, adequate for treatment, she will return tomorrow for cycle 4 cisplatin and gemcitabine.  -I spoke with AGinette Ottoand she arranged a transportation for her to come in for treatment tomorrow morning  -she will continue chemo every 2 weeks -I will see her every 4 weeks, the day before chemo     No problem-specific Assessment & Plan notes found for this encounter.   No orders of the defined types were placed in this encounter.  All questions were answered. The patient knows to call the clinic with any problems, questions or concerns. No barriers to learning was detected. I spent 25 minutes counseling the patient face to face. The total time spent in the appointment was 30 minutes and more than 50% was on counseling and review of test results     YTruitt Merle MD 02/22/2019     I, Soijett Blue am acting as scribe for Dr. YTruitt Merle  I have reviewed the above documentation for accuracy and completeness, and I agree with the above.

## 2019-02-22 ENCOUNTER — Telehealth: Payer: Self-pay | Admitting: Hematology

## 2019-02-22 ENCOUNTER — Inpatient Hospital Stay: Payer: Medicare Other

## 2019-02-22 ENCOUNTER — Inpatient Hospital Stay: Payer: Medicare Other | Attending: Obstetrics | Admitting: Hematology

## 2019-02-22 ENCOUNTER — Encounter: Payer: Self-pay | Admitting: Hematology

## 2019-02-22 VITALS — BP 123/71 | HR 81 | Temp 98.2°F | Resp 18 | Ht 59.0 in | Wt 120.4 lb

## 2019-02-22 DIAGNOSIS — I2510 Atherosclerotic heart disease of native coronary artery without angina pectoris: Secondary | ICD-10-CM

## 2019-02-22 DIAGNOSIS — C23 Malignant neoplasm of gallbladder: Secondary | ICD-10-CM

## 2019-02-22 DIAGNOSIS — Z452 Encounter for adjustment and management of vascular access device: Secondary | ICD-10-CM | POA: Diagnosis not present

## 2019-02-22 DIAGNOSIS — D6481 Anemia due to antineoplastic chemotherapy: Secondary | ICD-10-CM | POA: Diagnosis not present

## 2019-02-22 DIAGNOSIS — C7951 Secondary malignant neoplasm of bone: Secondary | ICD-10-CM | POA: Diagnosis not present

## 2019-02-22 DIAGNOSIS — E876 Hypokalemia: Secondary | ICD-10-CM

## 2019-02-22 DIAGNOSIS — Z5111 Encounter for antineoplastic chemotherapy: Secondary | ICD-10-CM | POA: Insufficient documentation

## 2019-02-22 DIAGNOSIS — Z95828 Presence of other vascular implants and grafts: Secondary | ICD-10-CM

## 2019-02-22 DIAGNOSIS — J45909 Unspecified asthma, uncomplicated: Secondary | ICD-10-CM

## 2019-02-22 LAB — COMPREHENSIVE METABOLIC PANEL
ALT: 15 U/L (ref 0–44)
AST: 22 U/L (ref 15–41)
Albumin: 3.3 g/dL — ABNORMAL LOW (ref 3.5–5.0)
Alkaline Phosphatase: 104 U/L (ref 38–126)
Anion gap: 9 (ref 5–15)
BUN: 12 mg/dL (ref 8–23)
CO2: 25 mmol/L (ref 22–32)
Calcium: 8.5 mg/dL — ABNORMAL LOW (ref 8.9–10.3)
Chloride: 104 mmol/L (ref 98–111)
Creatinine, Ser: 0.8 mg/dL (ref 0.44–1.00)
GFR calc Af Amer: >60 mL/min (ref >60)
GFR calc non Af Amer: >60 mL/min (ref >60)
Glucose, Bld: 80 mg/dL (ref 70–99)
Potassium: 4.2 mmol/L (ref 3.5–5.1)
SODIUM: 138 mmol/L (ref 135–145)
Total Bilirubin: 0.3 mg/dL (ref 0.3–1.2)
Total Protein: 8.2 g/dL — ABNORMAL HIGH (ref 6.5–8.1)

## 2019-02-22 LAB — CBC WITH DIFFERENTIAL (CANCER CENTER ONLY)
Abs Immature Granulocytes: 0.03 10*3/uL (ref 0.00–0.07)
Basophils Absolute: 0 10*3/uL (ref 0.0–0.1)
Basophils Relative: 1 %
Eosinophils Absolute: 0.1 10*3/uL (ref 0.0–0.5)
Eosinophils Relative: 2 %
HCT: 34.2 % — ABNORMAL LOW (ref 36.0–46.0)
Hemoglobin: 10.9 g/dL — ABNORMAL LOW (ref 12.0–15.0)
Immature Granulocytes: 1 %
Lymphocytes Relative: 25 %
Lymphs Abs: 1.4 10*3/uL (ref 0.7–4.0)
MCH: 28.6 pg (ref 26.0–34.0)
MCHC: 31.9 g/dL (ref 30.0–36.0)
MCV: 89.8 fL (ref 80.0–100.0)
Monocytes Absolute: 0.5 10*3/uL (ref 0.1–1.0)
Monocytes Relative: 9 %
Neutro Abs: 3.5 10*3/uL (ref 1.7–7.7)
Neutrophils Relative %: 62 %
Platelet Count: 252 10*3/uL (ref 150–400)
RBC: 3.81 MIL/uL — ABNORMAL LOW (ref 3.87–5.11)
RDW: 23.7 % — ABNORMAL HIGH (ref 11.5–15.5)
WBC Count: 5.6 10*3/uL (ref 4.0–10.5)
nRBC: 0 % (ref 0.0–0.2)

## 2019-02-22 MED ORDER — HEPARIN SOD (PORK) LOCK FLUSH 100 UNIT/ML IV SOLN
500.0000 [IU] | Freq: Once | INTRAVENOUS | Status: AC
  Administered 2019-02-22: 500 [IU] via INTRAVENOUS
  Filled 2019-02-22: qty 5

## 2019-02-22 MED ORDER — HEPARIN SOD (PORK) LOCK FLUSH 100 UNIT/ML IV SOLN
250.0000 [IU] | Freq: Once | INTRAVENOUS | Status: DC | PRN
  Filled 2019-02-22: qty 5

## 2019-02-22 MED ORDER — SODIUM CHLORIDE 0.9% FLUSH
10.0000 mL | INTRAVENOUS | Status: DC | PRN
  Administered 2019-02-22: 10 mL via INTRAVENOUS
  Filled 2019-02-22: qty 10

## 2019-02-22 NOTE — Telephone Encounter (Signed)
Received referral from [facility] - left voicemail for patient regarding the transportation program and helping her get to her appointment tomorrow morning.

## 2019-02-22 NOTE — Telephone Encounter (Signed)
Scheduled appt per 3/5 los. ° °Printed calendar and avs. °

## 2019-02-22 NOTE — Telephone Encounter (Signed)
Patient called me back and I have set her up with transportation to all of her appointments every other Friday. She has transportation to her appointments on Thursdays.

## 2019-02-23 ENCOUNTER — Inpatient Hospital Stay: Payer: Medicare Other

## 2019-02-23 ENCOUNTER — Encounter: Payer: Medicare Other | Admitting: Nutrition

## 2019-02-23 ENCOUNTER — Inpatient Hospital Stay: Payer: Medicare Other | Admitting: Nutrition

## 2019-02-23 VITALS — BP 131/73 | HR 82 | Temp 98.3°F | Resp 18

## 2019-02-23 DIAGNOSIS — Z7189 Other specified counseling: Secondary | ICD-10-CM

## 2019-02-23 DIAGNOSIS — Z5111 Encounter for antineoplastic chemotherapy: Secondary | ICD-10-CM | POA: Diagnosis not present

## 2019-02-23 DIAGNOSIS — C23 Malignant neoplasm of gallbladder: Secondary | ICD-10-CM

## 2019-02-23 LAB — CANCER ANTIGEN 19-9: CA 19-9: 307 U/mL — ABNORMAL HIGH (ref 0–35)

## 2019-02-23 MED ORDER — SODIUM CHLORIDE 0.9 % IV SOLN
Freq: Once | INTRAVENOUS | Status: AC
  Administered 2019-02-23: 09:00:00 via INTRAVENOUS
  Filled 2019-02-23: qty 250

## 2019-02-23 MED ORDER — FUROSEMIDE 10 MG/ML IJ SOLN
INTRAMUSCULAR | Status: AC
  Filled 2019-02-23: qty 2

## 2019-02-23 MED ORDER — PALONOSETRON HCL INJECTION 0.25 MG/5ML
0.2500 mg | Freq: Once | INTRAVENOUS | Status: AC
  Administered 2019-02-23: 0.25 mg via INTRAVENOUS

## 2019-02-23 MED ORDER — SODIUM CHLORIDE 0.9 % IV SOLN
Freq: Once | INTRAVENOUS | Status: AC
  Administered 2019-02-23: 11:00:00 via INTRAVENOUS
  Filled 2019-02-23: qty 5

## 2019-02-23 MED ORDER — PALONOSETRON HCL INJECTION 0.25 MG/5ML
INTRAVENOUS | Status: AC
  Filled 2019-02-23: qty 5

## 2019-02-23 MED ORDER — POTASSIUM CHLORIDE 2 MEQ/ML IV SOLN
Freq: Once | INTRAVENOUS | Status: AC
  Administered 2019-02-23: 09:00:00 via INTRAVENOUS
  Filled 2019-02-23: qty 10

## 2019-02-23 MED ORDER — SODIUM CHLORIDE 0.9 % IV SOLN
25.0000 mg/m2 | Freq: Once | INTRAVENOUS | Status: AC
  Administered 2019-02-23: 38 mg via INTRAVENOUS
  Filled 2019-02-23: qty 38

## 2019-02-23 MED ORDER — SODIUM CHLORIDE 0.9% FLUSH
10.0000 mL | INTRAVENOUS | Status: DC | PRN
  Administered 2019-02-23: 10 mL
  Filled 2019-02-23: qty 10

## 2019-02-23 MED ORDER — FUROSEMIDE 10 MG/ML IJ SOLN
20.0000 mg | Freq: Once | INTRAMUSCULAR | Status: AC
  Administered 2019-02-23: 20 mg via INTRAVENOUS

## 2019-02-23 MED ORDER — SODIUM CHLORIDE 0.9 % IV SOLN
1000.0000 mg/m2 | Freq: Once | INTRAVENOUS | Status: AC
  Administered 2019-02-23: 1520 mg via INTRAVENOUS
  Filled 2019-02-23: qty 39.98

## 2019-02-23 MED ORDER — HEPARIN SOD (PORK) LOCK FLUSH 100 UNIT/ML IV SOLN
500.0000 [IU] | Freq: Once | INTRAVENOUS | Status: AC | PRN
  Administered 2019-02-23: 500 [IU]
  Filled 2019-02-23: qty 5

## 2019-02-23 NOTE — Patient Instructions (Signed)
[facility] Discharge Instructions for Patients Receiving Chemotherapy  Today you received the following chemotherapy agents Gemzar and Cisplatin  To help prevent nausea and vomiting after your treatment, we encourage you to take your nausea medication as directed   If you develop nausea and vomiting that is not controlled by your nausea medication, call the clinic.   BELOW ARE SYMPTOMS THAT SHOULD BE REPORTED IMMEDIATELY:  *FEVER GREATER THAN 100.5 F  *CHILLS WITH OR WITHOUT FEVER  NAUSEA AND VOMITING THAT IS NOT CONTROLLED WITH YOUR NAUSEA MEDICATION  *UNUSUAL SHORTNESS OF BREATH  *UNUSUAL BRUISING OR BLEEDING  TENDERNESS IN MOUTH AND THROAT WITH OR WITHOUT PRESENCE OF ULCERS  *URINARY PROBLEMS  *BOWEL PROBLEMS  UNUSUAL RASH Items with * indicate a potential emergency and should be followed up as soon as possible.  Feel free to call the clinic should you have any questions or concerns. The clinic phone number is [phone].  Please show the CHEMO ALERT CARD at check-in to the Emergency Department and triage nurse.   

## 2019-02-23 NOTE — Progress Notes (Signed)
Nutrition follow-up completed with patient during infusion for gallbladder cancer. Patient pleased with weight gain. Weight improved to 120.4 pounds March 5 up from 115.7 pounds January 23. PET scan showed positive response to therapy. Albumin noted to be 3.3. Patient states she is drinking a clear liquid oral nutrition supplement without difficulty. She denies nausea, vomiting, constipation, and diarrhea.  Nutrition diagnosis: Unintended weight loss improved.  Intervention: Patient educated to continue strategies for increased oral intake throughout the day. Recommended patient continue clear liquid oral nutrition supplements. Provided coupons. Questions were answered.  Teach back method used.  Monitoring, evaluation and goals: Patient will continue to tolerate oral diet to minimize weight loss.  Next visit: Friday, April 3 during infusion.  **Disclaimer: This note was dictated with voice recognition software. Similar sounding words can inadvertently be transcribed and this note may contain transcription errors which may not have been corrected upon publication of note.**

## 2019-02-26 MED ORDER — OCTREOTIDE ACETATE 20 MG IM KIT
PACK | INTRAMUSCULAR | Status: AC
  Filled 2019-02-26: qty 1

## 2019-03-03 MED ORDER — ACETAMINOPHEN 325 MG PO TABS
ORAL_TABLET | ORAL | Status: AC
  Filled 2019-03-03: qty 2

## 2019-03-03 MED ORDER — DIPHENHYDRAMINE HCL 25 MG PO CAPS
ORAL_CAPSULE | ORAL | Status: AC
  Filled 2019-03-03: qty 1

## 2019-03-03 MED ORDER — FUROSEMIDE 10 MG/ML IJ SOLN
INTRAMUSCULAR | Status: AC
  Filled 2019-03-03: qty 2

## 2019-03-03 MED ORDER — METHYLPREDNISOLONE SODIUM SUCC 40 MG IJ SOLR
INTRAMUSCULAR | Status: AC
  Filled 2019-03-03: qty 1

## 2019-03-06 ENCOUNTER — Telehealth: Payer: Self-pay | Admitting: Oncology

## 2019-03-06 NOTE — Telephone Encounter (Signed)
Cassie Campbell left a message asking if chemo will be canceled on Friday due to the "virus."  Called her back and advised her that chemo has not been canceled.  Reviewed her schedule for this week with lab/flush on 03/08/19 and chemotherapy on 3/20 at 8:30.  She verbalized agreement and understanding.

## 2019-03-08 ENCOUNTER — Other Ambulatory Visit: Payer: Self-pay

## 2019-03-08 ENCOUNTER — Ambulatory Visit: Payer: Medicare Other | Admitting: Hematology

## 2019-03-08 ENCOUNTER — Inpatient Hospital Stay: Payer: Medicare Other

## 2019-03-08 DIAGNOSIS — C23 Malignant neoplasm of gallbladder: Secondary | ICD-10-CM

## 2019-03-08 DIAGNOSIS — Z5111 Encounter for antineoplastic chemotherapy: Secondary | ICD-10-CM | POA: Diagnosis not present

## 2019-03-08 LAB — CBC WITH DIFFERENTIAL (CANCER CENTER ONLY)
ABS IMMATURE GRANULOCYTES: 0.02 10*3/uL (ref 0.00–0.07)
Basophils Absolute: 0 10*3/uL (ref 0.0–0.1)
Basophils Relative: 0 %
Eosinophils Absolute: 0.1 10*3/uL (ref 0.0–0.5)
Eosinophils Relative: 2 %
HCT: 35.4 % — ABNORMAL LOW (ref 36.0–46.0)
Hemoglobin: 11.1 g/dL — ABNORMAL LOW (ref 12.0–15.0)
Immature Granulocytes: 0 %
Lymphocytes Relative: 19 %
Lymphs Abs: 1.2 10*3/uL (ref 0.7–4.0)
MCH: 28.9 pg (ref 26.0–34.0)
MCHC: 31.4 g/dL (ref 30.0–36.0)
MCV: 92.2 fL (ref 80.0–100.0)
MONO ABS: 0.6 10*3/uL (ref 0.1–1.0)
Monocytes Relative: 9 %
NEUTROS ABS: 4.5 10*3/uL (ref 1.7–7.7)
Neutrophils Relative %: 70 %
Platelet Count: 211 10*3/uL (ref 150–400)
RBC: 3.84 MIL/uL — ABNORMAL LOW (ref 3.87–5.11)
RDW: 24.6 % — ABNORMAL HIGH (ref 11.5–15.5)
WBC Count: 6.4 10*3/uL (ref 4.0–10.5)
nRBC: 0.3 % — ABNORMAL HIGH (ref 0.0–0.2)

## 2019-03-08 LAB — CMP (CANCER CENTER ONLY)
ALK PHOS: 96 U/L (ref 38–126)
ALT: 15 U/L (ref 0–44)
AST: 23 U/L (ref 15–41)
Albumin: 3.6 g/dL (ref 3.5–5.0)
Anion gap: 11 (ref 5–15)
BILIRUBIN TOTAL: 0.3 mg/dL (ref 0.3–1.2)
BUN: 10 mg/dL (ref 8–23)
CO2: 24 mmol/L (ref 22–32)
CREATININE: 0.82 mg/dL (ref 0.44–1.00)
Calcium: 9.7 mg/dL (ref 8.9–10.3)
Chloride: 105 mmol/L (ref 98–111)
GFR, Est AFR Am: >60 mL/min (ref >60)
GFR, Estimated: >60 mL/min (ref >60)
Glucose, Bld: 82 mg/dL (ref 70–99)
Potassium: 4.9 mmol/L (ref 3.5–5.1)
Sodium: 140 mmol/L (ref 135–145)
Total Protein: 8.3 g/dL — ABNORMAL HIGH (ref 6.5–8.1)

## 2019-03-08 MED ORDER — HEPARIN SOD (PORK) LOCK FLUSH 100 UNIT/ML IV SOLN
500.0000 [IU] | Freq: Once | INTRAVENOUS | Status: AC | PRN
  Administered 2019-03-08: 500 [IU]
  Filled 2019-03-08: qty 5

## 2019-03-08 MED ORDER — SODIUM CHLORIDE 0.9% FLUSH
10.0000 mL | Freq: Once | INTRAVENOUS | Status: AC | PRN
  Administered 2019-03-08: 10 mL
  Filled 2019-03-08: qty 10

## 2019-03-09 ENCOUNTER — Other Ambulatory Visit: Payer: Self-pay

## 2019-03-09 ENCOUNTER — Inpatient Hospital Stay: Payer: Medicare Other

## 2019-03-09 VITALS — BP 138/70 | HR 70 | Temp 98.5°F | Resp 18

## 2019-03-09 DIAGNOSIS — Z7189 Other specified counseling: Secondary | ICD-10-CM

## 2019-03-09 DIAGNOSIS — Z5111 Encounter for antineoplastic chemotherapy: Secondary | ICD-10-CM | POA: Diagnosis not present

## 2019-03-09 DIAGNOSIS — C23 Malignant neoplasm of gallbladder: Secondary | ICD-10-CM

## 2019-03-09 MED ORDER — SODIUM CHLORIDE 0.9 % IV SOLN
1000.0000 mg/m2 | Freq: Once | INTRAVENOUS | Status: AC
  Administered 2019-03-09: 1520 mg via INTRAVENOUS
  Filled 2019-03-09: qty 39.98

## 2019-03-09 MED ORDER — FUROSEMIDE 10 MG/ML IJ SOLN
INTRAMUSCULAR | Status: AC
  Filled 2019-03-09: qty 2

## 2019-03-09 MED ORDER — SODIUM CHLORIDE 0.9 % IV SOLN
Freq: Once | INTRAVENOUS | Status: AC
  Administered 2019-03-09: 11:00:00 via INTRAVENOUS
  Filled 2019-03-09: qty 5

## 2019-03-09 MED ORDER — SODIUM CHLORIDE 0.9 % IV SOLN
Freq: Once | INTRAVENOUS | Status: AC
  Administered 2019-03-09: 09:00:00 via INTRAVENOUS
  Filled 2019-03-09: qty 250

## 2019-03-09 MED ORDER — FUROSEMIDE 10 MG/ML IJ SOLN
20.0000 mg | Freq: Once | INTRAMUSCULAR | Status: AC
  Administered 2019-03-09: 20 mg via INTRAVENOUS

## 2019-03-09 MED ORDER — HEPARIN SOD (PORK) LOCK FLUSH 100 UNIT/ML IV SOLN
500.0000 [IU] | Freq: Once | INTRAVENOUS | Status: AC | PRN
  Administered 2019-03-09: 500 [IU]
  Filled 2019-03-09: qty 5

## 2019-03-09 MED ORDER — PALONOSETRON HCL INJECTION 0.25 MG/5ML
INTRAVENOUS | Status: AC
  Filled 2019-03-09: qty 5

## 2019-03-09 MED ORDER — POTASSIUM CHLORIDE 2 MEQ/ML IV SOLN
Freq: Once | INTRAVENOUS | Status: AC
  Administered 2019-03-09: 09:00:00 via INTRAVENOUS
  Filled 2019-03-09: qty 10

## 2019-03-09 MED ORDER — SODIUM CHLORIDE 0.9% FLUSH
10.0000 mL | INTRAVENOUS | Status: DC | PRN
  Administered 2019-03-09: 10 mL
  Filled 2019-03-09: qty 10

## 2019-03-09 MED ORDER — PALONOSETRON HCL INJECTION 0.25 MG/5ML
0.2500 mg | Freq: Once | INTRAVENOUS | Status: AC
  Administered 2019-03-09: 0.25 mg via INTRAVENOUS

## 2019-03-09 MED ORDER — SODIUM CHLORIDE 0.9 % IV SOLN
25.0000 mg/m2 | Freq: Once | INTRAVENOUS | Status: AC
  Administered 2019-03-09: 38 mg via INTRAVENOUS
  Filled 2019-03-09: qty 38

## 2019-03-09 NOTE — Patient Instructions (Addendum)
[facility] Discharge Instructions for Patients Receiving Chemotherapy  Today you received the following chemotherapy agents Gemzar and Cisplatin   To help prevent nausea and vomiting after your treatment, we encourage you to take your nausea medication as directed.    If you develop nausea and vomiting that is not controlled by your nausea medication, call the clinic.   BELOW ARE SYMPTOMS THAT SHOULD BE REPORTED IMMEDIATELY:  *FEVER GREATER THAN 100.5 F  *CHILLS WITH OR WITHOUT FEVER  NAUSEA AND VOMITING THAT IS NOT CONTROLLED WITH YOUR NAUSEA MEDICATION  *UNUSUAL SHORTNESS OF BREATH  *UNUSUAL BRUISING OR BLEEDING  TENDERNESS IN MOUTH AND THROAT WITH OR WITHOUT PRESENCE OF ULCERS  *URINARY PROBLEMS  *BOWEL PROBLEMS  UNUSUAL RASH Items with * indicate a potential emergency and should be followed up as soon as possible.  Feel free to call the clinic should you have any questions or concerns. The clinic phone number is [phone].  Please show the CHEMO ALERT CARD at check-in to the Emergency Department and triage nurse.  Coronavirus (COVID-19) Are you at risk?  Are you at risk for the Coronavirus (COVID-19)?  To be considered HIGH RISK for Coronavirus (COVID-19), you have to meet the following criteria:  . Traveled to [place], [place], [place], [place] or [place]; or in the [place] to [place], [place], [place], or [place]; and have fever, cough, and shortness of breath within the last 2 weeks of travel OR . Been in close contact with a person diagnosed with COVID-19 within the last 2 weeks and have fever, cough, and shortness of breath . IF YOU DO NOT MEET THESE CRITERIA, YOU ARE CONSIDERED LOW RISK FOR COVID-19.  What to do if you are HIGH RISK for COVID-19?  . If you are having a medical emergency, call 911. . Seek medical care right away. Before you go to a doctor's office, urgent care or emergency department, call ahead and tell them  about your recent travel, contact with someone diagnosed with COVID-19, and your symptoms. You should receive instructions from your physician's office regarding next steps of care.  . When you arrive at healthcare provider, tell the healthcare staff immediately you have returned from visiting [place], [place], [place], [place] or [place]; or traveled in the [place] to [place], [place], [place], or [place]; in the last two weeks or you have been in close contact with a person diagnosed with COVID-19 in the last 2 weeks.   . Tell the health care staff about your symptoms: fever, cough and shortness of breath. . After you have been seen by a medical provider, you will be either: o Tested for (COVID-19) and discharged home on quarantine except to seek medical care if symptoms worsen, and asked to  - Stay home and avoid contact with others until you get your results (4-5 days)  - Avoid travel on public transportation if possible (such as bus, train, or airplane) or o Sent to the Emergency Department by EMS for evaluation, COVID-19 testing, and possible admission depending on your condition and test results.  What to do if you are LOW RISK for COVID-19?  Reduce your risk of any infection by using the same precautions used for avoiding the common cold or flu:  . Wash your hands often with soap and warm water for at least 20 seconds.  If soap and water are not readily available, use an alcohol-based hand sanitizer with at least 60% alcohol.  .   sneezing, cover your mouth and nose by coughing or sneezing into the elbow areas of your shirt or coat, into a tissue or into your sleeve (not your hands). . Avoid shaking hands with others and consider head nods or verbal greetings only. . Avoid touching your eyes, nose, or mouth with unwashed hands.  . Avoid close contact with people who are sick. . Avoid places or events with large numbers of people in one location, like concerts or sporting  events. . Carefully consider travel plans you have or are making. . If you are planning any travel outside or inside the [place], visit the CDC's Travelers' Health webpage for the latest health notices. . If you have some symptoms but not all symptoms, continue to monitor at home and seek medical attention if your symptoms worsen. . If you are having a medical emergency, call 911.   [place] / e-Visit: [email or web address]         [facility]: [facility]: [phone]                   [facility]: [phone]

## 2019-03-12 ENCOUNTER — Ambulatory Visit: Payer: Medicare Other | Admitting: Cardiovascular Disease

## 2019-03-19 ENCOUNTER — Encounter: Payer: Self-pay | Admitting: General Practice

## 2019-03-19 NOTE — Progress Notes (Signed)
[place] Team contacted patient to assess for food insecurity and other psychosocial needs during current COVID19 pandemic.  Cassie Campbell reports that she has family and church members checking on her daily, with no needs at this time.  Support Team member encouraged patient to call if changes occur or they have any other questions/concerns.    [place], [place], [facility] Pager [phone] Voicemail [phone]

## 2019-03-20 NOTE — Progress Notes (Signed)
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 Lymph node, biopsy, mediastinal - LYMPH NODE WITH METASTATIC ADENOCARCINOMA. - SEE MICROSCOPIC DESCRIPTION 2. Plaque, coronary artery - CALCIFIED ATHEROSCLEROTIC PLAQUE.    10/14/2018 Miscellaneous    Foundation One:  MSI stable tumor mutation burden 3Muts/mb ERBB2 amplification CCNE1 amplification TP53 mutation(+) CDK6 amplification  HGF amplification     11/13/2018 Cancer Staging    Staging form: Gallbladder, AJCC 8th Edition - Clinical stage from 11/13/2018: Stage IVB (cTX, cN2, pM1) - Signed by Truitt Merle, MD on 12/15/2018    11/17/2018 Imaging    11/17/2018 CT CAP IMPRESSION: 1. 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 This is associated with extensive upper abdominal and retroperitoneal lymphadenopathy, as well as metastatic lymphadenopathy in the posterior mediastinum and left supraclavicular region. There is also a metastatic lesion to the left ischium. 2. 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 3. Aortic atherosclerosis, in addition to left main and 3 vessel coronary artery disease. Status post median sternotomy for CABG including LIMA to the LAD. 4. There are calcifications of the aortic valve. Echocardiographic correlation for evaluation of  potential valvular dysfunction may be warranted if clinically indicated.     11/21/2018 Initial Diagnosis    Gallbladder cancer ([place])    11/27/2018 Pathology Results    11/27/2018 CA19-9 immunohistochemical stain Per request, a CA19-9 immunohistochemical stain was performed at an outside institution revealing positive staining in the tumor cells.     12/15/2018 -  Chemotherapy     first line chemo cisplatin and gemcitabine every 2 weeks on 12/15/18    02/20/2019 PET scan    Restaging PET scan:  IMPRESSION: 1. Positive response to therapy at all primary and metastatic sites. Persistent primary and metastatic lesions do have intense hypermetabolic activity albeit reduced. 2. Decrease in size and hypermetabolic activity of mass lesion in the gallbladder fundus. 3. Interval decrease in size and metabolic activity of periportal adenopathy. 4. Decrease in size and metabolic activity of periaortic lymph nodes. 5. Interval decrease in size and metabolic activity of destructive lesion in the LEFT inferior pubic ramus with new sclerosis. 6. No new or progressive disease.      CURRENT THERAPY:   first linechemo cisplatin and gemcitabineevery 2 weekson 12/15/18   INTERVAL HISTORY:  JAQUELINNE Campbell is here for a follow up and ongoing treatment. She presents to the clinic today by herself. She notes she doing well and denies any issues. She does note fatigue after chemo that lasts 2-3 days. She notes she is still able to take care of herself at home and be active at home. She is overall tolerating chemo. I reviewed her medication list. She take antiemetics for 2 days after infusion which has helped her.     REVIEW OF SYSTEMS:   Constitutional: Denies fevers, chills or abnormal weight loss Eyes: Denies blurriness  of vision Ears, nose, mouth, throat, and face: Denies mucositis or sore throat Respiratory: Denies cough, dyspnea or wheezes Cardiovascular: Denies palpitation, chest  discomfort or lower extremity swelling Gastrointestinal:  Denies nausea, heartburn or change in bowel habits Skin: Denies abnormal skin rashes Lymphatics: Denies new lymphadenopathy or easy bruising Neurological:Denies numbness, tingling or new weaknesses Behavioral/Psych: Mood is stable, no new changes  All other systems were reviewed with the patient and are negative.  MEDICAL HISTORY:  Past Medical History:  Diagnosis Date  . Allergic rhinitis   . Arthritis   . Asthma    xolair s 8/05 ?11/07; mastered hfa 12/20/08  . Benign positional vertigo   . CAD (coronary artery disease)    a. 09/2014 NSTEMI s/p LHC with sig 2V dz. dLAD diffusely diseased and not suitable for PCI. unsuccessful RCA PCI d/t heavy calcifications  . GERD (gastroesophageal reflux disease)   . Heart attack ([place])    09/2014  . Hyperlipidemia    <130 ldl pos fm hx, bp  . Hypertension   . Osteopenia    dexa 08/22/07 AP spine + 1.1, left femur -1.3, right femur -.8; dexa 10/06/09 +1.6, left femur =1.6, right femur -.  . PONV (postoperative nausea and vomiting)   . Ruptured disc, cervical   . 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 BREAST SURGERY  10-26-10   Rt. breast bx--for microcalcifications in rt. retroareolar region--dx was hyalinized fibroadenoma  . BUNIONECTOMY Bilateral   . CARDIAC CATHETERIZATION     2015  . CATARACT EXTRACTION Right 02/02/2018   Dr Satira Sark  . CATARACT EXTRACTION Left 03/30/2018  . [place] SURGERY  12/01  . CORONARY ARTERY BYPASS GRAFT N/A 10/04/2018   Procedure: CORONARY ARTERY BYPASS GRAFTING (CABG) times 2 using left  Internal mammary artery to LAD, left greater saphenous vein - open harvest.;  Surgeon: [name], MD;  Location: [place];  Service: Open Heart Surgery;  Laterality: N/A;  . IR IMAGING GUIDED PORT INSERTION  12/08/2018  . LEFT HEART CATH AND CORONARY  ANGIOGRAPHY N/A 09/27/2018   Procedure: LEFT HEART CATH AND CORONARY ANGIOGRAPHY;  Surgeon: Troy Sine, MD;  Location: [place] [facility];  Service: Cardiovascular;  Laterality: N/A;  . LEFT HEART CATHETERIZATION WITH CORONARY ANGIOGRAM N/A 10/16/2014   Procedure: LEFT HEART CATHETERIZATION WITH CORONARY ANGIOGRAM;  Surgeon: Blane Ohara, MD;  Location: [facility] CATH LAB;  Service: Cardiovascular;  Laterality: N/A;  . Left L4-5 transforaminal lumbar interbody fusion with Depuy cage, rods and screws, local and allograft bone graft, Vivigen; bilateral decompression/partial hemilaminectomy lumbar five-sacral one  07/2017  . PERCUTANEOUS CORONARY ROTOBLATOR INTERVENTION (PCI-R) N/A 10/17/2014   Procedure: PERCUTANEOUS CORONARY ROTOBLATOR INTERVENTION (PCI-R);  Surgeon: Troy Sine, MD;  Location: [facility] CATH LAB;  Service: Cardiovascular;  Laterality: N/A;  . TEE WITHOUT CARDIOVERSION N/A 10/04/2018   Procedure: TRANSESOPHAGEAL ECHOCARDIOGRAM (TEE);  Surgeon: [name], MD;  Location: [place];  Service: Open Heart Surgery;  Laterality: N/A;  . VEIN LIGATION AND STRIPPING Left   . VIDEO BRONCHOSCOPY Bilateral 02/05/2015   Procedure: VIDEO BRONCHOSCOPY WITHOUT FLUORO;  Surgeon: Tanda Rockers, MD;  Location: [facility] ENDOSCOPY;  Service: Endoscopy;  Laterality: Bilateral;  . VULVA /PERINEUM BIOPSY  12-30-10   --epidermoid cyst    I have reviewed the social history and family history with the patient and they are unchanged from previous note.  ALLERGIES:  is  allergic to fish-derived products; penicillins; aspirin; brilinta [ticagrelor]; codeine phosphate; and diphenhydramine hcl.  MEDICATIONS:  Current Outpatient Medications  Medication Sig Dispense Refill  . albuterol (PROAIR HFA) 108 (90 Base) MCG/ACT inhaler INHALE 1 TO 2 PUFFS BY MOUTH EVERY 4 HOURS AS NEEDED FOR WHEEZING 8.5 g 3  . atorvastatin (LIPITOR) 80 MG tablet TAKE 1 TABLET(80 MG) BY MOUTH DAILY 90 tablet 1  . bisoprolol (ZEBETA) 10  MG tablet Take 1 tablet (10 mg total) by mouth daily. 90 tablet 1  . celecoxib (CELEBREX) 200 MG capsule Take 200 mg by mouth 2 (two) times daily as needed.    . cetirizine (ZYRTEC) 10 MG tablet Take 10 mg by mouth at bedtime as needed for allergies.    . cholecalciferol 2000 units TABS Take 1 tablet (2,000 Units total) by mouth daily. 30 tablet 3  . clopidogrel (PLAVIX) 75 MG tablet Take 1 tablet (75 mg total) by mouth daily. 90 tablet 3  . dextromethorphan-guaiFENesin (MUCINEX DM) 30-600 MG per 12 hr tablet Take 1-2 tablets by mouth every 12 (twelve) hours as needed for cough (with flutter).     . famotidine (PEPCID) 20 MG tablet Take 20 mg by mouth at bedtime.    . ferrous sulfate 325 (65 FE) MG tablet Take 325 mg by mouth 2 (two) times daily with a meal.    . fluticasone (FLONASE) 50 MCG/ACT nasal spray Place 1-2 sprays into both nostrils 2 (two) times daily as needed for allergies or rhinitis.    . furosemide (LASIX) 20 MG tablet Take 2 tablets (40 mg total) by mouth daily. 180 tablet 3  . HYDROcodone-acetaminophen (NORCO/VICODIN) 5-325 MG tablet Take 1 tablet by mouth every 6 (six) hours as needed for moderate pain. 30 tablet 0  . lidocaine-prilocaine (EMLA) cream Apply to affected area once 30 g 3  . magnesium oxide (MAG-OX) 400 (241.3 Mg) MG tablet Take 1 tablet (400 mg total) by mouth daily. 90 tablet 1  . meclizine (ANTIVERT) 25 MG tablet TAKE 2 TABLETS BY MOUTH THREE TIMES DAILY AS NEEDED 24 tablet 2  . montelukast (SINGULAIR) 10 MG tablet TAKE 1 TABLET(10 MG) BY MOUTH AT BEDTIME 90 tablet 0  . Multiple Vitamin (MULTIVITAMIN) capsule Take 1 capsule by mouth daily.     Marland Kitchen omeprazole (PRILOSEC) 20 MG capsule Take 20 mg by mouth daily.    . ondansetron (ZOFRAN) 8 MG tablet Take 1 tablet (8 mg total) by mouth 2 (two) times daily as needed. Start on the third day after chemotherapy. 30 tablet 1  . potassium chloride SA (K-DUR,KLOR-CON) 20 MEQ tablet Take 1 tablet (20 mEq total) by mouth 2  (two) times daily. 60 tablet 2  . prochlorperazine (COMPAZINE) 10 MG tablet Take 1 tablet (10 mg total) by mouth every 6 (six) hours as needed (Nausea or vomiting). 30 tablet 1  . SYMBICORT 160-4.5 MCG/ACT inhaler INHALE 2 PUFFS BY MOUTH EVERY 12 HOURS 10.2 g 11   No current facility-administered medications for this visit.     PHYSICAL EXAMINATION: ECOG PERFORMANCE STATUS: 1 - Symptomatic but completely ambulatory  Vitals:   03/22/19 1310  BP: 126/73  Pulse: 87  Resp: 18  Temp: 97.9 F (36.6 C)  SpO2: 99%   Filed Weights   03/22/19 1310  Weight: 122 lb 14.4 oz (55.7 kg)    GENERAL:alert, no distress and comfortable SKIN: skin color, texture, turgor are normal, no rashes or significant lesions EYES: normal, Conjunctiva are pink and non-injected, sclera clear OROPHARYNX:no exudate,  no erythema and lips, buccal mucosa, and tongue normal  NECK: supple, thyroid normal size, non-tender, without nodularity LYMPH:  no palpable lymphadenopathy in the cervical, axillary or inguinal LUNGS: clear to auscultation and percussion with normal breathing effort HEART: regular rate & rhythm and no murmurs and no lower extremity edema ABDOMEN:abdomen soft, non-tender and normal bowel sounds Musculoskeletal:no cyanosis of digits and no clubbing  NEURO: alert & oriented x 3 with fluent speech, no focal motor/sensory deficits  LABORATORY DATA:  I have reviewed the data as listed CBC Latest Ref Rng & Units 03/22/2019 03/08/2019 02/22/2019  WBC 4.0 - 10.5 K/uL 6.6 6.4 5.6  Hemoglobin 12.0 - 15.0 g/dL 11.4(L) 11.1(L) 10.9(L)  Hematocrit 36.0 - 46.0 % 36.2 35.4(L) 34.2(L)  Platelets 150 - 400 K/uL 209 211 252     CMP Latest Ref Rng & Units 03/22/2019 03/08/2019 02/22/2019  Glucose 70 - 99 mg/dL 83 82 80  BUN 8 - 23 mg/dL 14 10 12   Creatinine 0.44 - 1.00 mg/dL 0.83 0.82 0.80  Sodium 135 - 145 mmol/L 141 140 138  Potassium 3.5 - 5.1 mmol/L 4.3 4.9 4.2  Chloride 98 - 111 mmol/L 103 105 104  CO2 22 - 32  mmol/L 25 24 25   Calcium 8.9 - 10.3 mg/dL 9.4 9.7 8.5(L)  Total Protein 6.5 - 8.1 g/dL 8.4(H) 8.3(H) 8.2(H)  Total Bilirubin 0.3 - 1.2 mg/dL 0.3 0.3 0.3  Alkaline Phos 38 - 126 U/L 95 96 104  AST 15 - 41 U/L 24 23 22   ALT 0 - 44 U/L 16 15 15       RADIOGRAPHIC STUDIES: I have personally reviewed the radiological images as listed and agreed with the findings in the report. No results found.   ASSESSMENT & PLAN:  Cassie Campbell is a 78 y.o. 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 Tolerating well. -[employer] testing results showed MSI stable disease, low tumor mutation burden,notargetable mutation such asFGFR fusion or IDH mutation, but (+) HER2 amplification, may try Herceptin in the future. -She continues to have good response per Ca 19.9 and recent PET scan  -Labs reviewed, CBC and CMP WNL except hg at 11.4, protein at 8.4, Mag at 1.5. Ca 19.9 is still pending and has been trending down significantly well. Overall adequate to proceed with Gem/Cis tomorrow.  -Given she is tolerating and responding very well to treatment she is fine to have a chemo break down the road if she wants.  -Will repeat PET scan in June or July  -Given COVID-19 she has cancelled her uber transportation service and will get transportation from her friend or drive herself.  -continue chemo every 2 weeks and F/u in 4 weeks   2. CAD, S/P CABGX2 on 09/27/2018, EF 40-45% -Currently on Plavix. She also takes lasix during chemo. -f/u with Dr. Claiborne Billings -stable,she appears to be euvolemic  3. Asthma -Currently on Symbicort, Singulair, and albuterol as needed -f/u with pulmonary, stable, no recent flare   4.Goal of care discussion -The patient  understands the goal of care is palliative. -she is full code for now  5.Hypokalemia -Likely secondary to lasix. Currently on KCL 50mq once daily -K has been normal lately   6. Mild Anemia -Secondary to chemoand her recent open heart surgery -Labs reviewed,H/H stable and slowly  resolving lately. Hg at 11.4 today (03/22/19)   Plan -Labs reviewed and adequate to proceed with gem/Cis tomorrow and every 2 weeks  -f/u in 4 weeks    No problem-specific Assessment & Plan notes found for this encounter.   No orders of the defined types were placed in this encounter.  All questions were answered. The patient knows to call the clinic with any problems, questions or concerns. No barriers to learning was detected. I spent 20 minutes counseling the patient face to face. The total time spent in the appointment was 25 minutes and more than 50% was on counseling and review of test results     Truitt Merle, MD 03/22/2019   I, Joslyn Devon, am acting as scribe for Truitt Merle, MD.   I have reviewed the above documentation for accuracy and completeness, and I agree with the above.

## 2019-03-21 ENCOUNTER — Encounter: Payer: Self-pay | Admitting: Hematology

## 2019-03-21 NOTE — Progress Notes (Signed)
Patient called to inquire about available resources for hospital bills.  Advised patient I can mail her a Medicaid application.She verbalized understanding.  Placed in outgoing mail along with address and phone number for [facility] [address] [place] [address] [phone]). Also wrote down the number for [facility]([place]) [phone].  She has my card for any additional financial questions or concerns.

## 2019-03-22 ENCOUNTER — Inpatient Hospital Stay (HOSPITAL_BASED_OUTPATIENT_CLINIC_OR_DEPARTMENT_OTHER): Payer: Medicare Other | Admitting: Hematology

## 2019-03-22 ENCOUNTER — Inpatient Hospital Stay: Payer: Medicare Other | Attending: Obstetrics

## 2019-03-22 ENCOUNTER — Encounter: Payer: Self-pay | Admitting: Hematology

## 2019-03-22 ENCOUNTER — Inpatient Hospital Stay: Payer: Medicare Other

## 2019-03-22 ENCOUNTER — Other Ambulatory Visit: Payer: Self-pay

## 2019-03-22 VITALS — BP 126/73 | HR 87 | Temp 97.9°F | Resp 18 | Ht 59.0 in | Wt 122.9 lb

## 2019-03-22 DIAGNOSIS — C7951 Secondary malignant neoplasm of bone: Secondary | ICD-10-CM

## 2019-03-22 DIAGNOSIS — J45909 Unspecified asthma, uncomplicated: Secondary | ICD-10-CM | POA: Diagnosis not present

## 2019-03-22 DIAGNOSIS — C23 Malignant neoplasm of gallbladder: Secondary | ICD-10-CM

## 2019-03-22 DIAGNOSIS — C771 Secondary and unspecified malignant neoplasm of intrathoracic lymph nodes: Secondary | ICD-10-CM | POA: Diagnosis not present

## 2019-03-22 DIAGNOSIS — E876 Hypokalemia: Secondary | ICD-10-CM

## 2019-03-22 DIAGNOSIS — D6481 Anemia due to antineoplastic chemotherapy: Secondary | ICD-10-CM | POA: Insufficient documentation

## 2019-03-22 DIAGNOSIS — Z5111 Encounter for antineoplastic chemotherapy: Secondary | ICD-10-CM | POA: Insufficient documentation

## 2019-03-22 DIAGNOSIS — I2510 Atherosclerotic heart disease of native coronary artery without angina pectoris: Secondary | ICD-10-CM | POA: Insufficient documentation

## 2019-03-22 LAB — CMP (CANCER CENTER ONLY)
ALT: 16 U/L (ref 0–44)
AST: 24 U/L (ref 15–41)
Albumin: 3.6 g/dL (ref 3.5–5.0)
Alkaline Phosphatase: 95 U/L (ref 38–126)
Anion gap: 13 (ref 5–15)
BUN: 14 mg/dL (ref 8–23)
CO2: 25 mmol/L (ref 22–32)
Calcium: 9.4 mg/dL (ref 8.9–10.3)
Chloride: 103 mmol/L (ref 98–111)
Creatinine: 0.83 mg/dL (ref 0.44–1.00)
GFR, Est AFR Am: >60 mL/min (ref >60)
GFR, Estimated: >60 mL/min (ref >60)
Glucose, Bld: 83 mg/dL (ref 70–99)
Potassium: 4.3 mmol/L (ref 3.5–5.1)
Sodium: 141 mmol/L (ref 135–145)
Total Bilirubin: 0.3 mg/dL (ref 0.3–1.2)
Total Protein: 8.4 g/dL — ABNORMAL HIGH (ref 6.5–8.1)

## 2019-03-22 LAB — CBC WITH DIFFERENTIAL (CANCER CENTER ONLY)
Abs Immature Granulocytes: 0.03 10*3/uL (ref 0.00–0.07)
Basophils Absolute: 0 10*3/uL (ref 0.0–0.1)
Basophils Relative: 0 %
Eosinophils Absolute: 0.1 10*3/uL (ref 0.0–0.5)
Eosinophils Relative: 2 %
HCT: 36.2 % (ref 36.0–46.0)
Hemoglobin: 11.4 g/dL — ABNORMAL LOW (ref 12.0–15.0)
Immature Granulocytes: 1 %
Lymphocytes Relative: 19 %
Lymphs Abs: 1.2 10*3/uL (ref 0.7–4.0)
MCH: 29.6 pg (ref 26.0–34.0)
MCHC: 31.5 g/dL (ref 30.0–36.0)
MCV: 94 fL (ref 80.0–100.0)
Monocytes Absolute: 0.7 10*3/uL (ref 0.1–1.0)
Monocytes Relative: 10 %
Neutro Abs: 4.5 10*3/uL (ref 1.7–7.7)
Neutrophils Relative %: 68 %
Platelet Count: 209 10*3/uL (ref 150–400)
RBC: 3.85 MIL/uL — ABNORMAL LOW (ref 3.87–5.11)
RDW: 24.9 % — ABNORMAL HIGH (ref 11.5–15.5)
WBC Count: 6.6 10*3/uL (ref 4.0–10.5)
nRBC: 0.3 % — ABNORMAL HIGH (ref 0.0–0.2)

## 2019-03-22 LAB — MAGNESIUM: Magnesium: 1.5 mg/dL — ABNORMAL LOW (ref 1.7–2.4)

## 2019-03-23 ENCOUNTER — Inpatient Hospital Stay: Payer: Medicare Other | Admitting: Nutrition

## 2019-03-23 ENCOUNTER — Other Ambulatory Visit: Payer: Self-pay

## 2019-03-23 ENCOUNTER — Telehealth: Payer: Self-pay | Admitting: Hematology

## 2019-03-23 ENCOUNTER — Inpatient Hospital Stay: Payer: Medicare Other

## 2019-03-23 VITALS — BP 129/72 | HR 77 | Temp 98.5°F | Resp 18

## 2019-03-23 DIAGNOSIS — Z5111 Encounter for antineoplastic chemotherapy: Secondary | ICD-10-CM | POA: Diagnosis not present

## 2019-03-23 DIAGNOSIS — Z7189 Other specified counseling: Secondary | ICD-10-CM

## 2019-03-23 DIAGNOSIS — C23 Malignant neoplasm of gallbladder: Secondary | ICD-10-CM

## 2019-03-23 LAB — CANCER ANTIGEN 19-9: CA 19-9: 386 U/mL — ABNORMAL HIGH (ref 0–35)

## 2019-03-23 MED ORDER — SODIUM CHLORIDE 0.9 % IV SOLN
25.0000 mg/m2 | Freq: Once | INTRAVENOUS | Status: AC
  Administered 2019-03-23: 38 mg via INTRAVENOUS
  Filled 2019-03-23: qty 38

## 2019-03-23 MED ORDER — SODIUM CHLORIDE 0.9 % IV SOLN
Freq: Once | INTRAVENOUS | Status: AC
  Administered 2019-03-23: 11:00:00 via INTRAVENOUS
  Filled 2019-03-23: qty 5

## 2019-03-23 MED ORDER — FUROSEMIDE 10 MG/ML IJ SOLN
INTRAMUSCULAR | Status: AC
  Filled 2019-03-23: qty 2

## 2019-03-23 MED ORDER — PALONOSETRON HCL INJECTION 0.25 MG/5ML
INTRAVENOUS | Status: AC
  Filled 2019-03-23: qty 5

## 2019-03-23 MED ORDER — POTASSIUM CHLORIDE 2 MEQ/ML IV SOLN
Freq: Once | INTRAVENOUS | Status: AC
  Administered 2019-03-23: 09:00:00 via INTRAVENOUS
  Filled 2019-03-23: qty 10

## 2019-03-23 MED ORDER — PALONOSETRON HCL INJECTION 0.25 MG/5ML
0.2500 mg | Freq: Once | INTRAVENOUS | Status: AC
  Administered 2019-03-23: 0.25 mg via INTRAVENOUS

## 2019-03-23 MED ORDER — FUROSEMIDE 10 MG/ML IJ SOLN
20.0000 mg | Freq: Once | INTRAMUSCULAR | Status: AC
  Administered 2019-03-23: 12:00:00 20 mg via INTRAVENOUS

## 2019-03-23 MED ORDER — HEPARIN SOD (PORK) LOCK FLUSH 100 UNIT/ML IV SOLN
500.0000 [IU] | Freq: Once | INTRAVENOUS | Status: AC | PRN
  Administered 2019-03-23: 16:00:00 500 [IU]
  Filled 2019-03-23: qty 5

## 2019-03-23 MED ORDER — SODIUM CHLORIDE 0.9 % IV SOLN
1000.0000 mg/m2 | Freq: Once | INTRAVENOUS | Status: AC
  Administered 2019-03-23: 1520 mg via INTRAVENOUS
  Filled 2019-03-23: qty 39.98

## 2019-03-23 MED ORDER — SODIUM CHLORIDE 0.9 % IV SOLN
Freq: Once | INTRAVENOUS | Status: AC
  Administered 2019-03-23: 09:00:00 via INTRAVENOUS
  Filled 2019-03-23: qty 250

## 2019-03-23 MED ORDER — SODIUM CHLORIDE 0.9% FLUSH
10.0000 mL | INTRAVENOUS | Status: DC | PRN
  Administered 2019-03-23: 10 mL
  Filled 2019-03-23: qty 10

## 2019-03-23 NOTE — Progress Notes (Signed)
RD working remotely.  Nutrition follow up attempted by phone. Patient was unavailable but left message to return call as needed. Weight has improved and was documented as 122.9 pounds, increased from 120.4 pounds March 5. Labs reviewed. Nausea is controlled with antiemetics per MD note. No nutrition concerns identified.  Unintended weight loss resolved.  Patient has my contact number for questions or concerns.

## 2019-03-23 NOTE — Patient Instructions (Signed)
Coronavirus (COVID-19) Are you at risk?  Are you at risk for the Coronavirus (COVID-19)?  To be considered HIGH RISK for Coronavirus (COVID-19), you have to meet the following criteria:  . Traveled to [place], [place], [place], [place] or [place]; or in the [place] to [place], [place], [place], or [place]; and have fever, cough, and shortness of breath within the last 2 weeks of travel OR . Been in close contact with a person diagnosed with COVID-19 within the last 2 weeks and have fever, cough, and shortness of breath . IF YOU DO NOT MEET THESE CRITERIA, YOU ARE CONSIDERED LOW RISK FOR COVID-19.  What to do if you are HIGH RISK for COVID-19?  . If you are having a medical emergency, call 911. . Seek medical care right away. Before you go to a doctor's office, urgent care or emergency department, call ahead and tell them about your recent travel, contact with someone diagnosed with COVID-19, and your symptoms. You should receive instructions from your physician's office regarding next steps of care.  . When you arrive at healthcare provider, tell the healthcare staff immediately you have returned from visiting [place], [place], [place], [place] or [place]; or traveled in the [place] to [place], [place], [place], or [place]; in the last two weeks or you have been in close contact with a person diagnosed with COVID-19 in the last 2 weeks.   . Tell the health care staff about your symptoms: fever, cough and shortness of breath. . After you have been seen by a medical provider, you will be either: o Tested for (COVID-19) and discharged home on quarantine except to seek medical care if symptoms worsen, and asked to  - Stay home and avoid contact with others until you get your results (4-5 days)  - Avoid travel on public transportation if possible (such as bus, train, or airplane) or o Sent to the Emergency Department by EMS for evaluation, COVID-19 testing, and possible  admission depending on your condition and test results.  What to do if you are LOW RISK for COVID-19?  Reduce your risk of any infection by using the same precautions used for avoiding the common cold or flu:  . Wash your hands often with soap and warm water for at least 20 seconds.  If soap and water are not readily available, use an alcohol-based hand sanitizer with at least 60% alcohol.  . If coughing or sneezing, cover your mouth and nose by coughing or sneezing into the elbow areas of your shirt or coat, into a tissue or into your sleeve (not your hands). . Avoid shaking hands with others and consider head nods or verbal greetings only. . Avoid touching your eyes, nose, or mouth with unwashed hands.  . Avoid close contact with people who are sick. . Avoid places or events with large numbers of people in one location, like concerts or sporting events. . Carefully consider travel plans you have or are making. . If you are planning any travel outside or inside the [place], visit the CDC's Travelers' Health webpage for the latest health notices. . If you have some symptoms but not all symptoms, continue to monitor at home and seek medical attention if your symptoms worsen. . If you are having a medical emergency, call 911.   ADDITIONAL HEALTHCARE OPTIONS FOR PATIENTS  [facility] / e-Visit: [email or web address]         [facility]: [phone]  [facility]: [phone]                   [facility]: [phone]   [facility] Discharge Instructions for Patients Receiving Chemotherapy  Today you received the following chemotherapy agents Gemzar and Cisplatin   To help prevent nausea and vomiting after your treatment, we encourage you to take your nausea medication as directed.    If you develop nausea and vomiting that is not controlled by your nausea medication, call the clinic.    BELOW ARE SYMPTOMS THAT SHOULD BE REPORTED IMMEDIATELY:  *FEVER GREATER THAN 100.5 F  *CHILLS WITH OR WITHOUT FEVER  NAUSEA AND VOMITING THAT IS NOT CONTROLLED WITH YOUR NAUSEA MEDICATION  *UNUSUAL SHORTNESS OF BREATH  *UNUSUAL BRUISING OR BLEEDING  TENDERNESS IN MOUTH AND THROAT WITH OR WITHOUT PRESENCE OF ULCERS  *URINARY PROBLEMS  *BOWEL PROBLEMS  UNUSUAL RASH Items with * indicate a potential emergency and should be followed up as soon as possible.  Feel free to call the clinic should you have any questions or concerns. The clinic phone number is [phone].  Please show the CHEMO ALERT CARD at check-in to the Emergency Department and triage nurse.   

## 2019-03-23 NOTE — Telephone Encounter (Signed)
No los per 4/2 °

## 2019-03-26 ENCOUNTER — Telehealth: Payer: Self-pay

## 2019-03-26 NOTE — Telephone Encounter (Addendum)
Patient calls stating that at her last visit with Dr. Burr Medico she discussed her taking a month off from chemotherapy.  She wishes to take a month off now  but then resume her current schedule of chemotherapy every other week.  She wants to know if this can be done?  [phone]   Per Dr. Burr Medico okay to cancel infusion for 4/17, called patient to let her know that the appointments for 4/17 are cancelled, she verbalized an understanding.  Reminded her next appointments are 4/30.

## 2019-04-05 ENCOUNTER — Telehealth: Payer: Self-pay | Admitting: Cardiovascular Disease

## 2019-04-05 NOTE — Telephone Encounter (Signed)
Virtual Visit Pre-Appointment Phone Call  Steps For Call:  1. Confirm consent - "In the setting of the current Covid19 crisis, you are scheduled for a (phone or video) visit with your provider on (date) at (time).  Just as we do with many in-office visits, in order for you to participate in this visit, we must obtain consent.  If you'd like, I can send this to your mychart (if signed up) or email for you to review.  Otherwise, I can obtain your verbal consent now.  All virtual visits are billed to your insurance company just like a normal visit would be.  By agreeing to a virtual visit, we'd like you to understand that the technology does not allow for your provider to perform an examination, and thus may limit your provider's ability to fully assess your condition.  Finally, though the technology is pretty good, we cannot assure that it will always work on either your or our end, and in the setting of a video visit, we may have to convert it to a phone-only visit.  In either situation, we cannot ensure that we have a secure connection.  Are you willing to proceed?" STAFF: Did the patient verbally acknowledge consent to telehealth visit? Document YES/NO here: Yes  2. Confirm the BEST phone number to call the day of the visit by including in appointment notes  3. Give patient instructions for WebEx/MyChart download to smartphone as below or Doximity/Doxy.me if video visit (depending on what platform provider is using)  4. Advise patient to be prepared with their blood pressure, heart rate, weight, any heart rhythm information, their current medicines, and a piece of paper and pen handy for any instructions they may receive the day of their visit  5. Inform patient they will receive a phone call 15 minutes prior to their appointment time (may be from unknown caller ID) so they should be prepared to answer  6. Confirm that appointment type is correct in Epic appointment notes (VIDEO vs PHONE)      TELEPHONE CALL NOTE  Cassie Campbell has been deemed a candidate for a follow-up tele-health visit to limit community exposure during the Covid-19 pandemic. I spoke with the patient via phone to ensure availability of phone/video source, confirm preferred email & phone number, and discuss instructions and expectations.  I reminded Cassie Campbell to be prepared with any vital sign and/or heart rhythm information that could potentially be obtained via home monitoring, at the time of her visit. I reminded Cassie Campbell to expect a phone call at the time of her visit if her visit.  Therisa Doyne 04/05/2019 2:49 PM  Pt does not have internet and cannot do video visit. Also cannot do MyChart. Pt stated she does have a BP cuff and weight scale at home and the number listed in pt chart is best number to reach her. She consented to telephone visit with Dr. Claiborne Billings on 4/20 at 10 AM.     FULL LENGTH CONSENT FOR TELE-HEALTH VISIT   I hereby voluntarily request, consent and authorize [facility] and its employed or contracted physicians, physician assistants, nurse practitioners or other licensed health care professionals (the Practitioner), to provide me with telemedicine health care services (the "Services") as deemed necessary by the treating Practitioner. I acknowledge and consent to receive the Services by the Practitioner via telemedicine. I understand that the telemedicine visit will involve communicating with the Practitioner through live audiovisual communication technology and the disclosure  of certain medical information by electronic transmission. I acknowledge that I have been given the opportunity to request an in-person assessment or other available alternative prior to the telemedicine visit and am voluntarily participating in the telemedicine visit.  I understand that I have the right to withhold or withdraw my consent to the use of telemedicine in the course of my care at any  time, without affecting my right to future care or treatment, and that the Practitioner or I may terminate the telemedicine visit at any time. I understand that I have the right to inspect all information obtained and/or recorded in the course of the telemedicine visit and may receive copies of available information for a reasonable fee.  I understand that some of the potential risks of receiving the Services via telemedicine include:  Marland Kitchen Delay or interruption in medical evaluation due to technological equipment failure or disruption; . Information transmitted may not be sufficient (e.g. poor resolution of images) to allow for appropriate medical decision making by the Practitioner; and/or  . In rare instances, security protocols could fail, causing a breach of personal health information.  Furthermore, I acknowledge that it is my responsibility to provide information about my medical history, conditions and care that is complete and accurate to the best of my ability. I acknowledge that Practitioner's advice, recommendations, and/or decision may be based on factors not within their control, such as incomplete or inaccurate data provided by me or distortions of diagnostic images or specimens that may result from electronic transmissions. I understand that the practice of medicine is not an exact science and that Practitioner makes no warranties or guarantees regarding treatment outcomes. I acknowledge that I will receive a copy of this consent concurrently upon execution via email to the email address I last provided but may also request a printed copy by calling the office of [place].    I understand that my insurance will be billed for this visit.   I have read or had this consent read to me. . I understand the contents of this consent, which adequately explains the benefits and risks of the Services being provided via telemedicine.  . I have been provided ample opportunity to ask questions  regarding this consent and the Services and have had my questions answered to my satisfaction. . I give my informed consent for the services to be provided through the use of telemedicine in my medical care  By participating in this telemedicine visit I agree to the above.

## 2019-04-06 ENCOUNTER — Other Ambulatory Visit: Payer: Medicare Other

## 2019-04-06 ENCOUNTER — Telehealth: Payer: Self-pay | Admitting: Cardiovascular Disease

## 2019-04-06 ENCOUNTER — Ambulatory Visit: Payer: Medicare Other

## 2019-04-06 NOTE — Telephone Encounter (Signed)
Mychart pending, no smartphone, pre reg complete 04/06/19 AF

## 2019-04-09 ENCOUNTER — Encounter: Payer: Self-pay | Admitting: Cardiovascular Disease

## 2019-04-09 ENCOUNTER — Telehealth (INDEPENDENT_AMBULATORY_CARE_PROVIDER_SITE_OTHER): Payer: Medicare Other | Admitting: Cardiovascular Disease

## 2019-04-09 VITALS — BP 127/68 | HR 98 | Temp 97.8°F | Ht 59.0 in | Wt 120.2 lb

## 2019-04-09 DIAGNOSIS — R0609 Other forms of dyspnea: Secondary | ICD-10-CM

## 2019-04-09 DIAGNOSIS — I2510 Atherosclerotic heart disease of native coronary artery without angina pectoris: Secondary | ICD-10-CM

## 2019-04-09 DIAGNOSIS — Z951 Presence of aortocoronary bypass graft: Secondary | ICD-10-CM

## 2019-04-09 DIAGNOSIS — C799 Secondary malignant neoplasm of unspecified site: Secondary | ICD-10-CM

## 2019-04-09 DIAGNOSIS — I10 Essential (primary) hypertension: Secondary | ICD-10-CM

## 2019-04-09 DIAGNOSIS — J4520 Mild intermittent asthma, uncomplicated: Secondary | ICD-10-CM

## 2019-04-09 DIAGNOSIS — I2583 Coronary atherosclerosis due to lipid rich plaque: Secondary | ICD-10-CM

## 2019-04-09 DIAGNOSIS — E785 Hyperlipidemia, unspecified: Secondary | ICD-10-CM

## 2019-04-09 DIAGNOSIS — I255 Ischemic cardiomyopathy: Secondary | ICD-10-CM | POA: Diagnosis not present

## 2019-04-09 DIAGNOSIS — K219 Gastro-esophageal reflux disease without esophagitis: Secondary | ICD-10-CM

## 2019-04-09 NOTE — Patient Instructions (Signed)

## 2019-04-09 NOTE — Progress Notes (Signed)
Virtual Visit via Telephone Note   This visit type was conducted due to national recommendations for restrictions regarding the COVID-19 Pandemic (e.g. social distancing) in an effort to limit this patient's exposure and mitigate transmission in our community.  Due to her co-morbid illnesses, this patient is at least at moderate risk for complications without adequate follow up.  This format is felt to be most appropriate for this patient at this time.  The patient did not have access to video technology/had technical difficulties with video requiring transitioning to audio format only (telephone).  All issues noted in this document were discussed and addressed.  No physical exam could be performed with this format.  Please refer to the patient's chart for her  consent to telehealth for [facility].   Evaluation Performed:  Follow-up visit  Date:  04/09/2019   ID:  Cassie, Campbell June 25, 1941, MRN [id number]  Patient Location: Home Provider Location: Home  PCP:  Tanda Rockers, MD  Cardiologist:  Shelva Majestic, MD  Electrophysiologist:  None   Chief Complaint:  3 month F/U cardiology evaluation  History of Present Illness:    Cassie Campbell is a 78 y.o. female who has a history of complex CAD and underwent catheterization by Dr. Fletcher Anon with class IV symptoms on 10/16/2014.  She was found to have diffusely diseased LAD, which was not suitable for revascularization.  She had ruled in for non-ST segment elevation myocardial infarction, felt to be due to a severely calcified almost subtotal RCA with severe diffuse calcified disease.  Initial attempt at PCI by Dr. Fletcher Anon was unsuccessful due to the severe calcification.  She was brought back to the laboratory the following day, and I performed a very long attempt at high-speed rotational atherectomy.  Unfortunately, due to the severely stenosed calcified lesion above the acute margin the Roto floppy wire was never be advanced distal  enough due to severe disease beyond this site to allow the burr to be inserted to reach the stenosis.  Multiple attempts were made to utilize wire transfer, but even the smallest catheter was never able to pass the stenosis to allow for the Roto floppy wire to be reinserted in exchange for a Fielder XT wire, which was able to cross the stenosis and advanced distally.  Consequently, the procedure was aborted.  She was sent home on increased medication regimen with potential plans for possible follow-up evaluation depending upon symptom status.  During the hospitalization ranolazine was added initially at 500 mg twice a day to isosorbide mononitrate, amlodipine, and beta blocker therapy.  She has been treated with aspirin and Brilinta for dual antiplatelet therapy. When I saw her for subsequent evaluation I further titrated her ranolazine to 1000 mg twice a day and further titrated her Lopressor to 37.5 mg twice a day.  Her creatinine remained normal following her contrast load and she was mildly anemic.    She has a history of asthma and is on Symbicort 160-4.5 and Singulair.  She  has a history of hyperlipidemia, currently on Lipitor 80 mg.  She does have GERD for which he takes Pepcid as well as omeprazole.  She developed some increased wheezing on Lopressor and was changed to bisoprolol 5 mg in place of metoprolol.  She has seen Dr. Melvyn Novas for her severe chronic asthma and in the past.  She also was found to have marked atopy with significant elevation of IgG E levels.  She feels that her breathing has improved with the  changed to bisoprolol from Lopressor.   In March 2016 she had been remaining stable but  had experienced a 10-15 minute episode of chest pain during the evening which responded to nitroglycerin. When I saw her the following day she had been taking isosorbide 90 mg in the morning and I added isosorbide 30 mg at bedtime to her medical regimen of Ranexa 1000 mg twice a day , amlodipine 10 mg  daily, and bisoprolol 5 mg. She denies recurrent chest pain symptomatology since the nocturnal dose of Imdur was added.  She developed a rash and  was advised to stop both Brilinta and Ranexa.  She was started on Plavix. Her rash ultimately resolved.    Her husband passed away in on 07-Jan-2016.  She has been more active.  She denies nocturnal symptoms.  She was evaluated in the emergency room in January 2017 with right shoulder discomfort.    She underwent low back surgery in August 2018 by Dr. Louanne Skye. She tolerated surgery well.  A preoperative nuclear study showed an EF of 70% with probable apical soft tissue attenuation.  There was no ischemia.  Last saw her in November 2018 and she was doing well without chest pain or shortness of breath.  Although she has a history of mild asthma.   She was diagnosed with GERD.  She underwent successful cataract surgery right eye in February and left eye in April 2019 by Dr. Satira Sark.  In autum 2019  She developed symptoms suggestive of recurrent unstable angina.  She was hospitalized and on September 27, 2018 repeat cardiac catheterization by me was performed.  This revealed severe coronary calcification involving the LAD circumflex and RCA.  There was smooth 20 to 25% distal left main narrowing.  The LAD had 40% proximal calcified stenosis.  The LAD was severely calcified with 95% stenosis in the region of the first diagonal and there was diffuse disease in the midsegment of 50% followed by 90% mid stenosis and 80% distal stenosis.  The circumflex was calcified without high-grade obstructive disease.  The RCA was calcified and was totally occluded just prior to the acute margin.  There was extensive left-to-right collateralization to the distal RCA via the left circulation.  There was low normal LV function with an EF of 50 to 55% with a small region of focal mid mild anterolateral and posterior basal inferior hypocontractility.  Due to the severe calcification CABG  revascularization surgery was recommended.  She underwent CABG surgery x2 with her LIMA to her LAD and SVG to the PDA.  However, during surgery she was instantly found to have a mediastinal lymph node which was sent for pathology and unfortunately showed metastatic adenocarcinoma.  She is now being followed by Dr. 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  PET and CT imaging of her chest abdomen and pelvis with contrast showed diffuse adenopathy from supraclavicular to the abdomen with a hypermetabolic mass in the gallbladder and large hypermetabolic bony lesion in the posterior left pelvis.    I last saw her on December 06, 2018 in follow-up of her CABG revascularization and oncologic assessments.  She underwent successful Port-A-Cath placement on December 20 and Plavix was held for this procedure.  I recommended that she undergo an echo Doppler study prior to initiating chemotherapy.  Her echo Doppler study was done on December 11, 2018 and showed an EF of 40 to 45% status post revascularization with residual akinesis of the mid apical anteroseptal,  inferoseptal and apical wall.  She had grade 1 diastolic dysfunction.  Tissue Doppler was consistent with high ventricular filling pressures.  She had mildly increased PA pressure 38 mm and abnormal global longitudinal strain pattern.  On December 15, 2018 she initiated her first course of chemotherapy.  She had some mild nausea but otherwise felt well.   A follow-up echo Doppler study was done at the end of December prior to initiating her full course of chemotherapy.  This showed an EF of 40 to 45%.  There was residual akinesis of the mid apical anteroseptal, inferoseptal, and apical myocardium with anterior hypokinesis.  There was grade 1 diastolic dysfunction and abnormal tissue Doppler consistent with high ventricular pressures.  Peak pulmonary pressure was elevated at 38 mm.  Global strain was abnormal.  Since I  last saw her, she has been undergoing successful chemotherapy every 2 weeks with cis-platinum and gemcitabine.  She has completed 6 cycles and is currently taking a month off with plans for reinstitution on May 1.  Her most recent PET scan was significantly encouraging and showed significant early benefit with reduction in tumor burden.  She will undergo repeat PET scan in July.  From a cardiac perspective, she states her pulse typically runs in the 80s.  Her blood pressure has stable.  She denies recurrent anginal symptomatology.  She does note some mild shortness of breath for several days after her chemotherapy treatments but this resolves.  She is sleeping well.  She denies any arrhythmias.  There is no presyncope or syncope.  She presents for evaluation.   The patient does not have symptoms concerning for COVID-19 infection (fever, chills, cough, or new shortness of breath).    Past Medical History:  Diagnosis Date   Allergic rhinitis    Arthritis    Asthma    xolair s 8/05 ?11/07; mastered hfa 12/20/08   Benign positional vertigo    CAD (coronary artery disease)    a. 09/2014 NSTEMI s/p LHC with sig 2V dz. dLAD diffusely diseased and not suitable for PCI. unsuccessful RCA PCI d/t heavy calcifications   GERD (gastroesophageal reflux disease)    Heart attack ([place])    09/2014   Hyperlipidemia    <130 ldl pos fm hx, bp   Hypertension    Osteopenia    dexa 08/22/07 AP spine + 1.1, left femur -1.3, right femur -.8; dexa 10/06/09 +1.6, left femur =1.6, right femur -.   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 breast bx--for microcalcifications in rt. retroareolar region--dx was hyalinized fibroadenoma   BUNIONECTOMY Bilateral    CARDIAC CATHETERIZATION     2015   CATARACT EXTRACTION Right  02/02/2018   Dr Satira Sark   CATARACT EXTRACTION Left 03/30/2018   CERVICAL [place] SURGERY  12/01   CORONARY ARTERY BYPASS GRAFT N/A 10/04/2018   Procedure: CORONARY ARTERY BYPASS GRAFTING (CABG) times 2 using left  Internal mammary artery to LAD, left greater saphenous vein - open harvest.;  Surgeon: [name], MD;  Location: [place];  Service: Open Heart Surgery;  Laterality: N/A;   IR IMAGING GUIDED PORT INSERTION  12/08/2018   LEFT HEART CATH AND CORONARY ANGIOGRAPHY N/A 09/27/2018   Procedure: LEFT HEART CATH AND CORONARY ANGIOGRAPHY;  Surgeon: Troy Sine, MD;  Location: [place]  CV LAB;  Service: Cardiovascular;  Laterality: N/A;   LEFT HEART CATHETERIZATION WITH CORONARY ANGIOGRAM N/A 10/16/2014   Procedure: LEFT HEART CATHETERIZATION WITH CORONARY ANGIOGRAM;  Surgeon: Blane Ohara, MD;  Location: [facility] CATH LAB;  Service: Cardiovascular;  Laterality: N/A;   Left L4-5 transforaminal lumbar interbody fusion with Depuy cage, rods and screws, local and allograft bone graft, Vivigen; bilateral decompression/partial hemilaminectomy lumbar five-sacral one  07/2017   PERCUTANEOUS CORONARY ROTOBLATOR INTERVENTION (PCI-R) N/A 10/17/2014   Procedure: PERCUTANEOUS CORONARY ROTOBLATOR INTERVENTION (PCI-R);  Surgeon: Troy Sine, MD;  Location[facility] C[facility] LAB;  Service: Cardiovascular;  Laterality: N/A;   TEE WITHOUT CARDIOVERSION N/A 10/04/2018   Procedure: TRANSESOPHAGEAL ECHOCARDIOGRAM (TEE);  Surgeon: [name], MD;  Location: [place];  Service: Open Heart Surgery;  Laterality: N/A;   VEIN LIGATION AND STRIPPING Left    VIDEO BRONCHOSCOPY Bilateral 02/05/2015   Procedure: VIDEO BRONCHOSCOPY WITHOUT FLUORO;  Surgeon: Tanda Rockers, MD;  Location: [facility] ENDOSCOPY;  Service: Endoscopy;  Laterality: Bilateral;   VULVA /PERINEUM BIOPSY  12-30-10   --epidermoid cyst     Current Meds  Medication Sig   albuterol (PROAIR HFA) 108 (90 Base) MCG/ACT inhaler INHALE 1 TO 2  PUFFS BY MOUTH EVERY 4 HOURS AS NEEDED FOR WHEEZING   atorvastatin (LIPITOR) 80 MG tablet TAKE 1 TABLET(80 MG) BY MOUTH DAILY   bisoprolol (ZEBETA) 10 MG tablet Take 1 tablet (10 mg total) by mouth daily.   celecoxib (CELEBREX) 200 MG capsule Take 200 mg by mouth 2 (two) times daily as needed.   cetirizine (ZYRTEC) 10 MG tablet Take 10 mg by mouth at bedtime as needed for allergies.   cholecalciferol 2000 units TABS Take 1 tablet (2,000 Units total) by mouth daily.   clopidogrel (PLAVIX) 75 MG tablet Take 1 tablet (75 mg total) by mouth daily.   dextromethorphan-guaiFENesin (MUCINEX DM) 30-600 MG per 12 hr tablet Take 1-2 tablets by mouth every 12 (twelve) hours as needed for cough (with flutter).    famotidine (PEPCID) 20 MG tablet Take 20 mg by mouth as needed.    ferrous sulfate 325 (65 FE) MG tablet Take 325 mg by mouth 2 (two) times daily with a meal.   fluticasone (FLONASE) 50 MCG/ACT nasal spray Place 1-2 sprays into both nostrils 2 (two) times daily as needed for allergies or rhinitis.   furosemide (LASIX) 20 MG tablet Take 2 tablets (40 mg total) by mouth daily.   HYDROcodone-acetaminophen (NORCO/VICODIN) 5-325 MG tablet Take 1 tablet by mouth every 6 (six) hours as needed for moderate pain.   lidocaine-prilocaine (EMLA) cream Apply to affected area once   magnesium oxide (MAG-OX) 400 (241.3 Mg) MG tablet Take 1 tablet (400 mg total) by mouth daily.   meclizine (ANTIVERT) 25 MG tablet TAKE 2 TABLETS BY MOUTH THREE TIMES DAILY AS NEEDED   montelukast (SINGULAIR) 10 MG tablet TAKE 1 TABLET(10 MG) BY MOUTH AT BEDTIME   Multiple Vitamin (MULTIVITAMIN) capsule Take 1 capsule by mouth daily.    omeprazole (PRILOSEC) 20 MG capsule Take 20 mg by mouth as needed.    ondansetron (ZOFRAN) 8 MG tablet Take 1 tablet (8 mg total) by mouth 2 (two) times daily as needed. Start on the third day after chemotherapy.   potassium chloride SA (K-DUR,KLOR-CON) 20 MEQ tablet Take 1 tablet  (20 mEq total) by mouth 2 (two) times daily.   prochlorperazine (COMPAZINE) 10 MG tablet Take 1 tablet (10 mg total) by mouth every 6 (six) hours as needed (  Nausea or vomiting).   SYMBICORT 160-4.5 MCG/ACT inhaler INHALE 2 PUFFS BY MOUTH EVERY 12 HOURS     Allergies:   Fish-derived products; Penicillins; Aspirin; Brilinta [ticagrelor]; Codeine phosphate; and Diphenhydramine hcl   Social History   Tobacco Use   Smoking status: Never Smoker   Smokeless tobacco: Never Used  Substance Use Topics   Alcohol use: No    Alcohol/week: 0.0 standard drinks   Drug use: No     Family Hx: The patient's family history includes Breast cancer (age of onset: 20) in her daughter; Diabetes in her mother and sister; Heart disease in her father; Hypertension in her mother and sister; Stroke in her mother.  ROS:   General: Negative; No fevers, chills, or night sweats;  HEENT: Cataract surgery in both eyes; negative; No changes in vision or hearing, sinus congestion, difficulty swallowing Pulmonary: Negative; No cough, wheezing, shortness of breath, hemoptysis Cardiovascular: See HPI; she does get mildly short of breath for several days following her chemotherapy but this resolves;  no chest pain, presyncope, syncope, palpitations GI: Negative; No nausea, vomiting, diarrhea, nor abdominal pain GU: Negative; No dysuria, hematuria, or difficulty voiding Musculoskeletal: History of prior right shoulder discomfort and low back discomfort Hematologic/Oncology: Metastatic gallbladder cancer Endocrine: Negative; no heat/cold intolerance; no diabetes Neuro: Negative; no changes in balance, headaches Skin: Negative; No rashes or skin lesions Psychiatric: Negative; No behavioral problems, depression Sleep: Negative; No snoring, daytime sleepiness, hypersomnolence, bruxism, restless legs, hypnogognic hallucinations, no cataplexy Other comprehensive 14 point system review is negative. All other systems  reviewed and are negative.   Prior CV studies:   The following studies were reviewed today:  ECHO Study Conclusions: 12/11/2018  - Left ventricle: The cavity size was normal. Systolic function was   mildly to moderately reduced. The estimated ejection fraction was   in the range of 40% to 45%. Akinesis of the mid-apical   anteroseptal, inferoseptal, and apical myocardium. Hypokinesis of   the anterior myocardium. Doppler parameters are consistent with   abnormal left ventricular relaxation (grade 1 diastolic   dysfunction). Doppler parameters are consistent with high   ventricular filling pressure. - Aortic valve: Transvalvular velocity was within the normal range.   There was no stenosis. There was no regurgitation. - Mitral valve: Transvalvular velocity was within the normal range.   There was no evidence for stenosis. There was mild regurgitation. - Left atrium: The atrium was mildly dilated. - Right ventricle: The cavity size was normal. Wall thickness was   normal. Systolic function was normal. - Atrial septum: No defect or patent foramen ovale was identified   by color flow Doppler. - Tricuspid valve: There was mild regurgitation. - Pulmonary arteries: Systolic pressure was within the normal   range. PA peak pressure: 38 mm Hg (S). - Global longitudinal strain -10.0% (abnormal).    NM PET MPRESSION: 02/20/2019 1. Positive response to therapy at all primary and metastatic sites. Persistent primary and metastatic lesions do have intense hypermetabolic activity albeit reduced. 2. Decrease in size and hypermetabolic activity of mass lesion in the gallbladder fundus. 3. Interval decrease in size and metabolic activity of periportal adenopathy. 4. Decrease in size and metabolic activity of periaortic lymph nodes. 5. Interval decrease in size and metabolic activity of destructive lesion in the LEFT inferior pubic ramus with new sclerosis. 6. No new or progressive  disease.   Labs/Other Tests and Data Reviewed:    EKG:  An ECG dated 12/25/2018 was personally reviewed today and demonstrated: Normal  sinus rhythm at 78 bpm.  Biatrial enlargement.  Q waves V1 through V3.  Anterolateral T wave abnormality.  This was not significantly changed from her December 2019 ECG which showed normal sinus rhythm at 76 bpm, QS anteroseptally and lateral T wave abnormality  Recent Labs: 11/27/2018: Pro B Natriuretic peptide (BNP) 1,313.0 12/07/2018: TSH 1.880 02/05/2019: BNP 423.6 03/22/2019: ALT 16; BUN 14; Creatinine 0.83; Hemoglobin 11.4; Magnesium 1.5; Platelet Count 209; Potassium 4.3; Sodium 141   Recent Lipid Panel Lab Results  Component Value Date/Time   CHOL 145 12/07/2018 08:59 AM   TRIG 101 12/07/2018 08:59 AM   TRIG 85 10/05/2006 10:22 AM   HDL 47 12/07/2018 08:59 AM   CHOLHDL 3.1 12/07/2018 08:59 AM   CHOLHDL 3 03/09/2018 09:09 AM   LDLCALC 78 12/07/2018 08:59 AM    Wt Readings from Last 3 Encounters:  04/09/19 120 lb 3.2 oz (54.5 kg)  03/22/19 122 lb 14.4 oz (55.7 kg)  02/22/19 120 lb 6.4 oz (54.6 kg)     Objective:    Vital Signs:  BP 127/68    Pulse 98    Temp 97.8 F (36.6 C)    Ht 4\' 11"  (1.499 m)    Wt 120 lb 3.2 oz (54.5 kg)    LMP 12/21/1999    BMI 24.28 kg/m    Her breathing is normal and not labored. She denies any awareness of neck vein distention. There is no audible wheezing When asked to palpate her chest wall there is no tenderness. She specifically denies at present any abdominal soreness. She is not aware of any swelling of her ankles or legs. She is not aware of any neurologic issues She appears to have  normal affect and normal cognition.   ASSESSMENT & PLAN:    1. CAD/CABG revascularization surgery: Ms. Hoiland at present is doing well.  She is not having any anginal symptomatology.  She continues to be on bisoprolol and furosemide. 2. Ischemic cardiomyopathy: Most recent echo prior to chemotherapy showed an EF of 40 to  45% with wall motion abnormalities as noted above.  She denies any baseline shortness of breath but does admit to some dyspnea for several days following chemotherapy.  She continues to be on bisoprolol and furosemide.  At present she denies any edema. 3. Essential hypertension: Blood pressure today is stable on bisoprolol and furosemide. 4. Hyperlipidemia: She continues to be on atorvastatin 80 mg daily.  Most recent lipid panel total cholesterol 145, LDL 78, triglycerides 101, and HDL 47.  We will continue with present regimen.  However if LDL continues to be elevated addition of Zetia may be beneficial. 5. Metastatic gallbladder cancer: Initial excellent result with chemotherapy every 2 weeks.  Most recent PET scan has shown improvement.  Followed closely by Dr. Burr Medico with plans for follow-up PET scan in July. 6. History of asthma: Currently stable on Symbicort and Singulair. 7. GERD: Controlled on omeprazole  COVID-19 Education: The signs and symptoms of COVID-19 were discussed with the patient and how to seek care for testing (follow up with PCP or arrange E-visit).  The importance of social distancing was discussed today.  Time:   Today, I have spent 25 minutes with the patient with telehealth technology discussing the above problems.     Medication Adjustments/Labs and Tests Ordered: Current medicines are reviewed at length with the patient today.  Concerns regarding medicines are outlined above.   Tests Ordered: No orders of the defined types were placed in this encounter.  Medication Changes: No orders of the defined types were placed in this encounter.   Disposition:  Follow up 6 months  Signed, Shelva Majestic, MD  04/09/2019 10:03 AM    [facility]

## 2019-04-10 ENCOUNTER — Other Ambulatory Visit: Payer: Self-pay

## 2019-04-10 ENCOUNTER — Ambulatory Visit: Payer: Medicare Other | Admitting: Internal Medicine

## 2019-04-10 ENCOUNTER — Encounter: Payer: Self-pay | Admitting: Internal Medicine

## 2019-04-10 VITALS — BP 118/60 | HR 76 | Ht 59.0 in | Wt 121.8 lb

## 2019-04-10 DIAGNOSIS — R0609 Other forms of dyspnea: Secondary | ICD-10-CM | POA: Diagnosis not present

## 2019-04-10 DIAGNOSIS — J4540 Moderate persistent asthma, uncomplicated: Secondary | ICD-10-CM

## 2019-04-10 DIAGNOSIS — C23 Malignant neoplasm of gallbladder: Secondary | ICD-10-CM

## 2019-04-10 DIAGNOSIS — I10 Essential (primary) hypertension: Secondary | ICD-10-CM | POA: Diagnosis not present

## 2019-04-10 NOTE — Progress Notes (Signed)
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 Follow in pulmonary clinic also for primary care with hbp/ hyperlipidemia complicated by MI 70/62/37 and persistent mucus plug in RML/ RLL s/p fob 02/05/15 with marked improvement p lavage and no endobrchial lesions, just diffuse airway swelling      History of Present Illness   10/14/13 [name] > laser rx for back pain  rad legs > resolved    Admit Date: 10/15/2014 Discharge date: 10/19/2014    PRIMARY DISCHARGE DIAGNOSIS:  NSTEMI (non-ST elevated myocardial infarction)   Essential hypertension  GERD (gastroesophageal reflux disease)  Chest pain  Hyperlipidemia  Hypertension  CAD (coronary artery disease)       04/11/2017  f/u ov/[name] re: asthma/ hbp/ leg swelling with pain R foot  Chief Complaint  Patient presents with  . Follow-up    Increased cough, wheezing anfd runny nose for the past month.    no change off arnuity/ worse though with nasal symptoms x one month  Wheezing better when using the symb 160 2bid  rec Stop bisoprolol-hct and instead take bisoprolol 5 mg daily  Start lasix (furosemide) 20 mg daily Prednisone 10 mg take  4 each am x 2 days,   2 each am x 2 days,  1 each am x 2 days and stop       08/15/17 [name] back surgery    Admit date: 09/26/2018 Discharge date: 10/09/2018  Admission Diagnoses: 1. Unstable angina ([place]) 2. History of CAD (coronary artery disease), history of NSTEMI  Discharge Diagnoses:  1. S/P CABG x 2 2. Right bundle branch block 3. ABL anemia 4. History of Hyperlipidemia  5. History of Essential hypertension 6. History of obesity 7. History of GERD (gastroesophageal reflux disease) 8. History of Ruptured disc, cervical 9. History of  osteopenia 10. History of Spondylolisthesis at L4-L5 level   Procedure (s):  LEFT HEART CATH AND CORONARY ANGIOGRAPHY by Dr. Claiborne Billings on 09/27/2018:  Conclusion     Acute Mrg lesion is 95% stenosed.  Mid RCA to Dist RCA lesion is 100% stenosed.  Prox RCA to Mid RCA lesion is 50% stenosed.  Mid RCA lesion is 80% stenosed.  Mid LM to Dist LM lesion is 25% stenosed.  Ost LAD lesion by Dr.  is 40% stenosed.  Prox LAD-1 lesion is 95% stenosed.  Prox LAD-2 lesion is 50% stenosed.  Mid LAD-1 lesion is 90% stenosed.  Mid LAD-2 lesion is 80% stenosed.  Dist LAD lesion is 80% stenosed.  The left ventricular ejection fraction is 50-55% by visual estimate.  LV end diastolic pressure is low.  Severe coronary calcification involving the LAD, circumflex, and RCA.  There is smooth 20 to 25% narrowing in the distal left main.  The LAD has 40% proximal calcified stenosis. After the first septal perforating artery and be in the region of the first diagonal vessel the LAD is severely calcified with 95% stenosis. There is diffuse disease in the mid segment with 50% narrowing followed by 90% stenosis with distal 80% stenoses.  Calcified left circumflex vessel without high-grade obstructive disease.  Calcified RCA with 50% proximal 80% mid and total occlusion prior to the acute margin. There is extensive left-to-right collateralization to the distal RCA via the left  circulation.  Low normal global LV function with an ejection fraction at 50 to 55%. There is small focal region of mild mid anterolateral posterior basal inferior hypo-contractility. LVEDP 8 mm Hg.     Median sternotomy, extracorporeal circulation, coronary artery bypass grafting x2 (left internal mammary artery to left anterior descending with endarterectomy and saphenous vein graft to posterior descending by Dr. Roxan Hockey on 10/04/2018.  History of Presenting Illness: Cassie Campbell is a 78 yo woman with  known CAD with an MI in 49. Attempted PCI- unsuccessful, treated medically. Also has a history of hypertension, hyperlipidemia, GRED, obesity, depression, asthma, arthritis and DDD. 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 Acute Visit    Increased DOE x 2 months. She gets winded walking room to room at home. She is using her proair 4 x daily on average. She has also noticed some chest tightness, wheezing and cough with clear sputum.   Dyspnea:  Room to rom  Cough: more congested/ wheezy  24/7some better with saba  Sleeping: bed 30 degrees SABA use: 4 x daily and noct 02: none  rec Plan A = Automatic = Symbicort 160 mg Take 2 puffs first thing in am and then another 2 puffs about 12 hours later.  Plan B = Backup Only use your  albuterol inhaler as a rescue medication  Prednisone 10 mg take  4 each am x 2 days,   2 each am x 2 days,  1 each am x 2 days and stop  Resume Try prilosec otc 20m  Take 30-60 min before first meal of the day and Pepcid ac (famotidine) 20 mg one @  Bedtime    04/10/2019  f/u ov/[name] re: chronic asthma/ met gb ca /Jaquelyn BitterChief Complaint  Patient presents with  . Follow-up    states breathing is better since LOV; uses Symbicort daily, uses albuterol as needed   Dyspnea:  MMRC1 = can walk nl pace, flat grade, can't hurry or go uphills or steps s sob   Cough: no Sleeping: 30 degrees with bed blocks /ok  SABA use: none 02: none Zyrtec twice a week working ok  mucinex about the same    No obvious day to day or daytime variability or assoc excess/ purulent sputum or mucus plugs or hemoptysis or cp or chest tightness, subjective wheeze or overt sinus or hb symptoms.   Sleeping as above  without nocturnal  or  early am exacerbation  of respiratory  c/o's or need for noct saba. Also denies any obvious fluctuation of symptoms with weather or environmental changes or other aggravating or alleviating factors except as outlined above   No unusual exposure hx or h/o childhood pna/ asthma or knowledge of premature birth.  Current Allergies, Complete Past Medical History, Past Surgical History, Family History, and Social History were reviewed in [employer].  ROS  The following are not active complaints unless bolded Hoarseness, sore throat, dysphagia, dental problems, itching, sneezing,  nasal congestion or discharge of excess mucus or purulent secretions, ear ache,   fever, chills, sweats, unintended wt loss or wt gain, classically pleuritic or exertional cp,  orthopnea pnd or arm/hand swelling  or leg swelling, presyncope, palpitations, abdominal pain, anorexia p started chemo, nausea, vomiting, diarrhea  or change in bowel habits or change in bladder habits, change in stools or change in urine, dysuria, hematuria,  rash, arthralgias, visual complaints, headache, numbness, weakness or ataxia or problems with walking or coordination,  change in mood or  memory.        Current Meds  Medication Sig  . albuterol (PROAIR HFA) 108 (90 Base) MCG/ACT inhaler INHALE 1 TO 2 PUFFS BY MOUTH EVERY 4 HOURS AS NEEDED FOR WHEEZING  . atorvastatin (LIPITOR) 80 MG tablet TAKE 1 TABLET(80 MG) BY MOUTH DAILY  . bisoprolol (ZEBETA) 10 MG tablet Take 1 tablet (10 mg total) by mouth daily.  . celecoxib (CELEBREX) 200 MG capsule Take 200 mg by mouth 2 (two) times daily as needed.  . cetirizine (ZYRTEC) 10 MG tablet Take 10 mg by mouth at bedtime as needed for allergies.  . cholecalciferol 2000 units TABS Take 1 tablet (2,000 Units total) by mouth daily.  . clopidogrel (PLAVIX) 75 MG tablet Take 1 tablet (75 mg total) by mouth daily.  .Marland Kitchendextromethorphan-guaiFENesin (MUCINEX DM) 30-600 MG per 12 hr  tablet Take 1-2 tablets by mouth every 12 (twelve) hours as needed for cough (with flutter).   . famotidine (PEPCID) 20 MG tablet Take 20 mg by mouth as needed.   . ferrous sulfate 325 (65 FE) MG tablet Take 325 mg by mouth 2 (two) times daily with a meal.  . fluticasone (FLONASE) 50 MCG/ACT nasal spray Place 1-2 sprays into both nostrils 2 (two) times daily as needed for allergies or  rhinitis.  . furosemide (LASIX) 20 MG tablet Take 2 tablets (40 mg total) by mouth daily.  Marland Kitchen HYDROcodone-acetaminophen (NORCO/VICODIN) 5-325 MG tablet Take 1 tablet by mouth every 6 (six) hours as needed for moderate pain.  Marland Kitchen lidocaine-prilocaine (EMLA) cream Apply to affected area once  . magnesium oxide (MAG-OX) 400 (241.3 Mg) MG tablet Take 1 tablet (400 mg total) by mouth daily.  . meclizine (ANTIVERT) 25 MG tablet TAKE 2 TABLETS BY MOUTH THREE TIMES DAILY AS NEEDED  . montelukast (SINGULAIR) 10 MG tablet TAKE 1 TABLET(10 MG) BY MOUTH AT BEDTIME  . Multiple Vitamin (MULTIVITAMIN) capsule Take 1 capsule by mouth daily.   Marland Kitchen omeprazole (PRILOSEC) 20 MG capsule Take 20 mg by mouth as needed.   . ondansetron (ZOFRAN) 8 MG tablet Take 1 tablet (8 mg total) by mouth 2 (two) times daily as needed. Start on the third day after chemotherapy.  . potassium chloride SA (K-DUR,KLOR-CON) 20 MEQ tablet Take 1 tablet (20 mEq total) by mouth 2 (two) times daily.  . prochlorperazine (COMPAZINE) 10 MG tablet Take 1 tablet (10 mg total) by mouth every 6 (six) hours as needed (Nausea or vomiting).  . SYMBICORT 160-4.5 MCG/ACT inhaler INHALE 2 PUFFS BY MOUTH EVERY 12 HOURS                           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)  OSTEOPENIA (ICD-733.90)  - DEXA 08/22/07 AP spine + 1.1, left femur -1.3, right femur -.8  - DEXA 10/06/09 + 1.6 left femur -1.6, right femur -.5   -DEXA 2014 no change  HYPERTENSION (ICD-401.9)  HYPERLIPIDEMIA (ICD-272.4)  - Target < 130 ldl pos fm hx, hbp   OBESITY  - Target wt = 178 for BMI < 30  ASTHMA (ICD-493.90) ([name] not active)  -Xolair s 8/05 > 10/2006  -Mastered HFA December 30, 2008  ALLERGIC RHINITIS (ICD-477.9)  S/p cervical fusion 2000 ............................................................................ [place]...........................................................................Marland KitchenWert  - Td 05/20/2015  - Pneumovax 09/2003 and October 27, 2009  Prevnar 05/07/2014  - Colonscopy 10/28/2004  - CPX 11/19/13     - GYN per [name]'s office - Mammogram 2013 , 2014         Objective:   Physical Exam  Wt 143 11/09/2011 >  04/12/2012  140 > 06/26/2012  139 >  11/13/2012 135 >  137 02/12/2013 >141 05/16/2013 > 07/31/2013  139 >  11/19/2013  134 >135 02/18/2014 > 04/16/2014 134 > 05/07/2014  134 >131 08/06/2014 > 10/30/2014 130 > 11/28/2014 128 > 02/18/2015   133 > 05/20/2015 133 > 07/14/2015   135 >137 08/26/2015 > 11/25/2015 143 > 12/29/2015  137 > 02/23/2016   131 > 06/10/2016   148 > 07/08/2016   148 > 10/06/2016 146 > 04/11/2017  140 >  11/24/2017  136 > 06/08/2018  96% > 11/27/2018  121 >  04/10/2019  121       [facility] amb bf nad   HEENT: Top dentures/ nl turbinates bilaterally, and oropharynx. Nl external ear canals without cough reflex   NECK :  without JVD/Nodes/TM/ nl carotid upstrokes bilaterally   LUNGS: no acc muscle use,  Nl contour chest with min late exp rhonchi bilaterally without cough on insp or exp maneuvers   CV:  RRR  no s3 or murmur or increase in P2, and no edema   ABD:  soft and nontender with nl inspiratory excursion in the supine position. No  bruits or organomegaly appreciated, bowel sounds nl  MS:  Nl gait/ ext warm without deformities, calf tenderness, cyanosis or clubbing No obvious joint restrictions   SKIN: warm and dry without lesions    NEURO:  alert, approp, nl sensorium with  no motor or cerebellar deficits apparent.              Assessment & Plan:

## 2019-04-10 NOTE — Assessment & Plan Note (Signed)
Improved back to baseline. 

## 2019-04-10 NOTE — Assessment & Plan Note (Signed)
Followed by Dr Kelly/ cards -Changed lopressor to bisoprolol 11/29/14 due to wheezing  - changed to bisoprolol /hct due to tendency to leg swelling > d/c 04/11/2017 with ?  gout L foot   Adequate control on present rx, reviewed in detail with pt > no change in rx needed

## 2019-04-10 NOTE — Assessment & Plan Note (Signed)
-  Xolair rx  07/2004 > 10/2006  - IgE  296 11/20/2013  - Prevnar rx 05/07/14  - Spirometry 06/10/2016  FEV1 1.06 (80%)  Ratio 57 - NO   06/10/2016  = 15   - 06/10/2016  After extensive coaching HFA effectiveness =    75%  - IgE  06/10/16  293  - FENO 07/08/2016  =   9   - 04/10/2019  After extensive coaching inhaler device,  effectiveness =    75%   Despite suboptimal hfa > All goals of chronic asthma control met including optimal function and elimination of symptoms with minimal need for rescue therapy.  Contingencies discussed in full including contacting this office immediately if not controlling the symptoms using the rule of two's.

## 2019-04-10 NOTE — Assessment & Plan Note (Addendum)
F/u Dr Luis Abed labs reviewed from 03/22/2019 look ok though Ca-19 has plateaued in the 300s/ no specific complaints re met dz     I had an extended discussion with the patient reviewing all relevant studies completed to date and  lasting 15 to 20 minutes of a 25 minute visit    See device teaching which extended face to face time for this visit   Each maintenance medication was reviewed in detail including most importantly the difference between maintenance and prns and under what circumstances the prns are to be triggered using an action plan format that is not reflected in the computer generated alphabetically organized AVS but trather by a customized med calendar that reflects the AVS meds with confirmed 100% correlation.   In addition, Please see AVS for unique instructions that I personally wrote and verbalized to the the pt in detail and then reviewed with pt  by my nurse highlighting any  changes in therapy recommended at today's visit to their plan of care.

## 2019-04-10 NOTE — Patient Instructions (Addendum)
No change in medications   Please schedule a follow up visit in 4 months but call sooner if needed  

## 2019-04-16 NOTE — Progress Notes (Signed)
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 Lymph node, biopsy, mediastinal - LYMPH NODE WITH METASTATIC ADENOCARCINOMA. - SEE MICROSCOPIC DESCRIPTION 2. Plaque, coronary artery - CALCIFIED ATHEROSCLEROTIC PLAQUE.    10/14/2018 Miscellaneous    Foundation One:  MSI stable tumor mutation burden 3Muts/mb ERBB2 amplification CCNE1 amplification TP53 mutation(+) CDK6 amplification  HGF amplification     11/13/2018 Cancer Staging    Staging form: Gallbladder, AJCC 8th Edition - Clinical stage from 11/13/2018: Stage IVB (cTX, cN2, pM1) - Signed by Truitt Merle, MD on 12/15/2018    11/17/2018 Imaging    11/17/2018 CT CAP IMPRESSION: 1. 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 This is associated with extensive upper abdominal and retroperitoneal lymphadenopathy, as well as metastatic lymphadenopathy in the posterior mediastinum and left supraclavicular region. There is also a metastatic lesion to the left ischium. 2. 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 3. Aortic atherosclerosis, in addition to left main and 3 vessel coronary artery disease. Status post median sternotomy for CABG including LIMA to the LAD. 4. There are calcifications of the aortic valve. Echocardiographic correlation for evaluation  of potential valvular dysfunction may be warranted if clinically indicated.     11/21/2018 Initial Diagnosis    Gallbladder cancer ([place])    11/27/2018 Pathology Results    11/27/2018 CA19-9 immunohistochemical stain Per request, a CA19-9 immunohistochemical stain was performed at an outside institution revealing positive staining in the tumor cells.     12/15/2018 -  Chemotherapy     first line chemo cisplatin and gemcitabine every 2 weeks on 12/15/18    02/20/2019 PET scan    Restaging PET scan:  IMPRESSION: 1. Positive response to therapy at all primary and metastatic sites. Persistent primary and metastatic lesions do have intense hypermetabolic activity albeit reduced. 2. Decrease in size and hypermetabolic activity of mass lesion in the gallbladder fundus. 3. Interval decrease in size and metabolic activity of periportal adenopathy. 4. Decrease in size and metabolic activity of periaortic lymph nodes. 5. Interval decrease in size and metabolic activity of destructive lesion in the LEFT inferior pubic ramus with new sclerosis. 6. No new or progressive disease.      CURRENT THERAPY:  First linechemo cisplatin and gemcitabineevery 2 weekson 12/15/18. Had 1 month chemo break in April, restarted on 04/20/19  INTERVAL HISTORY:  Cassie Campbell is here for a follow up and ongoing treatment. Cassie Campbell presents to the clinic today by herself. Cassie Campbell notes Cassie Campbell is doing well and enjoyed her chemo break. Cassie Campbell denies pain or any concerns. Cassie Campbell is eating well with regular BMs. Cassie Campbell denies new or productive cough, Sob or fever. Cassie Campbell does have asmtha and will cough occasionally.     REVIEW OF SYSTEMS:   Constitutional: Denies fevers, chills or abnormal weight loss Eyes: Denies blurriness of vision Ears, nose, mouth, throat, and face: Denies mucositis  or sore throat Respiratory: Denies cough, dyspnea or wheezes Cardiovascular: Denies palpitation, chest discomfort or lower extremity swelling  Gastrointestinal:  Denies nausea, heartburn or change in bowel habits Skin: Denies abnormal skin rashes Lymphatics: Denies new lymphadenopathy or easy bruising Neurological:Denies numbness, tingling or new weaknesses Behavioral/Psych: Mood is stable, no new changes  All other systems were reviewed with the patient and are negative.  MEDICAL HISTORY:  Past Medical History:  Diagnosis Date  . Allergic rhinitis   . Arthritis   . Asthma    xolair s 8/05 ?11/07; mastered hfa 12/20/08  . Benign positional vertigo   . CAD (coronary artery disease)    a. 09/2014 NSTEMI s/p LHC with sig 2V dz. dLAD diffusely diseased and not suitable for PCI. unsuccessful RCA PCI d/t heavy calcifications  . GERD (gastroesophageal reflux disease)   . Heart attack ([place])    09/2014  . Hyperlipidemia    <130 ldl pos fm hx, bp  . Hypertension   . Osteopenia    dexa 08/22/07 AP spine + 1.1, left femur -1.3, right femur -.8; dexa 10/06/09 +1.6, left femur =1.6, right femur -.  . PONV (postoperative nausea and vomiting)   . Ruptured disc, cervical   . 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 BREAST SURGERY  10-26-10   Rt. breast bx--for microcalcifications in rt. retroareolar region--dx was hyalinized fibroadenoma  . BUNIONECTOMY Bilateral   . CARDIAC CATHETERIZATION     2015  . CATARACT EXTRACTION Right 02/02/2018   Dr Satira Sark  . CATARACT EXTRACTION Left 03/30/2018  . [place] SURGERY  12/01  . CORONARY ARTERY BYPASS GRAFT N/A 10/04/2018   Procedure: CORONARY ARTERY BYPASS GRAFTING (CABG) times 2 using left  Internal mammary artery to LAD, left greater saphenous vein - open harvest.;  Surgeon: [name], MD;  Location: [facility];  Service: Open Heart Surgery;  Laterality: N/A;  . IR IMAGING GUIDED PORT INSERTION  12/08/2018  . LEFT HEART CATH AND CORONARY ANGIOGRAPHY N/A 09/27/2018   Procedure: LEFT  HEART CATH AND CORONARY ANGIOGRAPHY;  Surgeon: Troy Sine, MD;  Location: [facility];  Service: Cardiovascular;  Laterality: N/A;  . LEFT HEART CATHETERIZATION WITH CORONARY ANGIOGRAM N/A 10/16/2014   Procedure: LEFT HEART CATHETERIZATION WITH CORONARY ANGIOGRAM;  Surgeon: Blane Ohara, MD;  Location[facility];  Service: Cardiovascular;  Laterality: N/A;  . Left L4-5 transforaminal lumbar interbody fusion with Depuy cage, rods and screws, local and allograft bone graft, Vivigen; bilateral decompression/partial hemilaminectomy lumbar five-sacral one  07/2017  . PERCUTANEOUS CORONARY ROTOBLATOR INTERVENTION (PCI-R) N/A 10/17/2014   Procedure: PERCUTANEOUS CORONARY ROTOBLATOR INTERVENTION (PCI-R);  Surgeon: Troy Sine, MD;  Location: [facility] CATH LAB;  Service: Cardiovascular;  Laterality: N/A;  . TEE WITHOUT CARDIOVERSION N/A 10/04/2018   Procedure: TRANSESOPHAGEAL ECHOCARDIOGRAM (TEE);  Surgeon: [name], MD;  Location: [facility];  Service: Open Heart Surgery;  Laterality: N/A;  . VEIN LIGATION AND STRIPPING Left   . VIDEO BRONCHOSCOPY Bilateral 02/05/2015   Procedure: VIDEO BRONCHOSCOPY WITHOUT FLUORO;  Surgeon: Tanda Rockers, MD;  Location: [facility];  Service: Endoscopy;  Laterality: Bilateral;  . VULVA /PERINEUM BIOPSY  12-30-10   --epidermoid cyst    I have reviewed the social history and family history with the patient and they are unchanged from previous note.  ALLERGIES:  is allergic to fish-derived products; penicillins; aspirin; brilinta [ticagrelor]; codeine phosphate;  and diphenhydramine hcl.  MEDICATIONS:  Current Outpatient Medications  Medication Sig Dispense Refill  . albuterol (PROAIR HFA) 108 (90 Base) MCG/ACT inhaler INHALE 1 TO 2 PUFFS BY MOUTH EVERY 4 HOURS AS NEEDED FOR WHEEZING 8.5 g 3  . atorvastatin (LIPITOR) 80 MG tablet TAKE 1 TABLET(80 MG) BY MOUTH DAILY 90 tablet 1  . bisoprolol (ZEBETA) 10 MG tablet Take 1 tablet (10 mg total) by  mouth daily. 90 tablet 1  . celecoxib (CELEBREX) 200 MG capsule Take 200 mg by mouth 2 (two) times daily as needed.    . cetirizine (ZYRTEC) 10 MG tablet Take 10 mg by mouth at bedtime as needed for allergies.    . cholecalciferol 2000 units TABS Take 1 tablet (2,000 Units total) by mouth daily. 30 tablet 3  . clopidogrel (PLAVIX) 75 MG tablet Take 1 tablet (75 mg total) by mouth daily. 90 tablet 3  . dextromethorphan-guaiFENesin (MUCINEX DM) 30-600 MG per 12 hr tablet Take 1-2 tablets by mouth every 12 (twelve) hours as needed for cough (with flutter).     . famotidine (PEPCID) 20 MG tablet Take 20 mg by mouth as needed.     . ferrous sulfate 325 (65 FE) MG tablet Take 325 mg by mouth 2 (two) times daily with a meal.    . fluticasone (FLONASE) 50 MCG/ACT nasal spray Place 1-2 sprays into both nostrils 2 (two) times daily as needed for allergies or rhinitis.    . furosemide (LASIX) 20 MG tablet Take 2 tablets (40 mg total) by mouth daily. 180 tablet 3  . HYDROcodone-acetaminophen (NORCO/VICODIN) 5-325 MG tablet Take 1 tablet by mouth every 6 (six) hours as needed for moderate pain. 30 tablet 0  . lidocaine-prilocaine (EMLA) cream Apply to affected area once 30 g 3  . magnesium oxide (MAG-OX) 400 (241.3 Mg) MG tablet Take 1 tablet (400 mg total) by mouth daily. 90 tablet 1  . meclizine (ANTIVERT) 25 MG tablet TAKE 2 TABLETS BY MOUTH THREE TIMES DAILY AS NEEDED 24 tablet 2  . montelukast (SINGULAIR) 10 MG tablet TAKE 1 TABLET(10 MG) BY MOUTH AT BEDTIME 90 tablet 0  . Multiple Vitamin (MULTIVITAMIN) capsule Take 1 capsule by mouth daily.     Marland Kitchen omeprazole (PRILOSEC) 20 MG capsule Take 20 mg by mouth as needed.     . ondansetron (ZOFRAN) 8 MG tablet Take 1 tablet (8 mg total) by mouth 2 (two) times daily as needed. Start on the third day after chemotherapy. 30 tablet 1  . potassium chloride SA (K-DUR,KLOR-CON) 20 MEQ tablet Take 1 tablet (20 mEq total) by mouth 2 (two) times daily. 60 tablet 2  .  prochlorperazine (COMPAZINE) 10 MG tablet Take 1 tablet (10 mg total) by mouth every 6 (six) hours as needed (Nausea or vomiting). 30 tablet 1  . SYMBICORT 160-4.5 MCG/ACT inhaler INHALE 2 PUFFS BY MOUTH EVERY 12 HOURS 10.2 g 11   No current facility-administered medications for this visit.     PHYSICAL EXAMINATION: ECOG PERFORMANCE STATUS: 0 - Asymptomatic  Vitals:   04/19/19 1425  BP: 139/77  Pulse: 90  Resp: 18  Temp: 98.7 F (37.1 C)  SpO2: 98%   Filed Weights   04/19/19 1425  Weight: 121 lb 3.2 oz (55 kg)    GENERAL:alert, no distress and comfortable SKIN: skin color, texture, turgor are normal, no rashes or significant lesions EYES: normal, Conjunctiva are pink and non-injected, sclera clear OROPHARYNX:no exudate, no erythema and lips, buccal mucosa, and tongue normal  NECK: supple, thyroid normal size, non-tender, without nodularity LYMPH:  no palpable lymphadenopathy in the cervical, axillary or inguinal LUNGS: clear to auscultation and percussion with normal breathing effort HEART: regular rate & rhythm and no murmurs and no lower extremity edema ABDOMEN:abdomen soft, non-tender and normal bowel sounds Musculoskeletal:no cyanosis of digits and no clubbing  NEURO: alert & oriented x 3 with fluent speech, no focal motor/sensory deficits  LABORATORY DATA:  I have reviewed the data as listed CBC Latest Ref Rng & Units 04/19/2019 03/22/2019 03/08/2019  WBC 4.0 - 10.5 K/uL 8.3 6.6 6.4  Hemoglobin 12.0 - 15.0 g/dL 12.4 11.4(L) 11.1(L)  Hematocrit 36.0 - 46.0 % 38.0 36.2 35.4(L)  Platelets 150 - 400 K/uL 486(H) 209 211     CMP Latest Ref Rng & Units 04/19/2019 03/22/2019 03/08/2019  Glucose 70 - 99 mg/dL 92 83 82  BUN 8 - 23 mg/dL 13 14 10   Creatinine 0.44 - 1.00 mg/dL 0.88 0.83 0.82  Sodium 135 - 145 mmol/L 140 141 140  Potassium 3.5 - 5.1 mmol/L 4.1 4.3 4.9  Chloride 98 - 111 mmol/L 102 103 105  CO2 22 - 32 mmol/L 25 25 24   Calcium 8.9 - 10.3 mg/dL 9.2 9.4 9.7  Total  Protein 6.5 - 8.1 g/dL 9.2(H) 8.4(H) 8.3(H)  Total Bilirubin 0.3 - 1.2 mg/dL 0.3 0.3 0.3  Alkaline Phos 38 - 126 U/L 100 95 96  AST 15 - 41 U/L 23 24 23   ALT 0 - 44 U/L 12 16 15       RADIOGRAPHIC STUDIES: I have personally reviewed the radiological images as listed and agreed with the findings in the report. No results found.   ASSESSMENT & PLAN:  LORI-ANN LINDFORS is a 78 y.o. 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 Tolerating well. -[employer] testing results showed MSI stable disease, low tumor mutation burden,notargetable mutation such asFGFR fusion or IDH mutation, but (+) HER2 amplification, may try Herceptin in the future. -Cassie Campbell continues to have good response based on decresing Ca 19.9 and recent 02/2019 PET scan findings. Plan to rescan in July.  -Cassie Campbell requested a chemo break for a month and ready to restart now, Cassie Campbell is doing very well, asymptomatic.  Labs reviewed, CBC and CMP WNL except plt at 486K, protein at 9.2, CA 19.9 still pending. Overall adequate to proceed with Gem/Cis tomorrow and continue every 2 weeks -f/u with me in 4 weeks   2. CAD, S/P CABGX2 on 09/27/2018, EF 40-45% -Currently on Plavix. Cassie Campbell also takes lasix during chemo. -f/u with Dr. Claiborne Billings -stable,Cassie Campbell appears to be euvolemic  3. Asthma -Currently on Symbicort, Singulair, and albuterol as needed -f/u with pulmonary, stable, no recent flare  4.Goal of care discussion -The patient understands the goal of care is palliative. -Cassie Campbell is full code for now  5.Hypokalemia -Likely secondary to lasix. Currently on KCL 62mq once daily -K has been normal lately   6. Mild Anemia -Secondary to chemoand her recent open heart surgery  -resolved currently due to the chemo break    Plan -Labs reviewed and adequate to proceed with gem/Cis tomorrow and every 2 weeks  -f/u in 4 weeks    No problem-specific Assessment & Plan notes found for this encounter.   No orders of the defined types were placed  in this encounter.  All questions were answered. The patient knows to call the clinic with any problems, questions or concerns. No barriers to learning was detected. I spent 15 minutes counseling the patient face to face. The total time spent in the appointment was 20 minutes and more than 50% was on counseling and review of test results     Truitt Merle, MD 04/19/2019   I, Joslyn Devon, am acting as scribe for Truitt Merle, MD.   I have reviewed the above documentation for accuracy and completeness, and I agree with the above.

## 2019-04-19 ENCOUNTER — Inpatient Hospital Stay: Payer: Medicare Other

## 2019-04-19 ENCOUNTER — Inpatient Hospital Stay (HOSPITAL_BASED_OUTPATIENT_CLINIC_OR_DEPARTMENT_OTHER): Payer: Medicare Other | Admitting: Hematology

## 2019-04-19 ENCOUNTER — Telehealth: Payer: Self-pay | Admitting: Hematology

## 2019-04-19 ENCOUNTER — Encounter: Payer: Self-pay | Admitting: Hematology

## 2019-04-19 ENCOUNTER — Other Ambulatory Visit: Payer: Self-pay

## 2019-04-19 VITALS — BP 139/77 | HR 90 | Temp 98.7°F | Resp 18 | Ht 59.0 in | Wt 121.2 lb

## 2019-04-19 DIAGNOSIS — C7951 Secondary malignant neoplasm of bone: Secondary | ICD-10-CM | POA: Diagnosis not present

## 2019-04-19 DIAGNOSIS — C23 Malignant neoplasm of gallbladder: Secondary | ICD-10-CM

## 2019-04-19 DIAGNOSIS — C771 Secondary and unspecified malignant neoplasm of intrathoracic lymph nodes: Secondary | ICD-10-CM

## 2019-04-19 DIAGNOSIS — E876 Hypokalemia: Secondary | ICD-10-CM | POA: Diagnosis not present

## 2019-04-19 DIAGNOSIS — D6481 Anemia due to antineoplastic chemotherapy: Secondary | ICD-10-CM

## 2019-04-19 DIAGNOSIS — Z5111 Encounter for antineoplastic chemotherapy: Secondary | ICD-10-CM | POA: Diagnosis not present

## 2019-04-19 DIAGNOSIS — J45909 Unspecified asthma, uncomplicated: Secondary | ICD-10-CM

## 2019-04-19 DIAGNOSIS — I2510 Atherosclerotic heart disease of native coronary artery without angina pectoris: Secondary | ICD-10-CM

## 2019-04-19 LAB — CBC WITH DIFFERENTIAL (CANCER CENTER ONLY)
Abs Immature Granulocytes: 0.04 10*3/uL (ref 0.00–0.07)
Basophils Absolute: 0.1 10*3/uL (ref 0.0–0.1)
Basophils Relative: 1 %
Eosinophils Absolute: 0.3 10*3/uL (ref 0.0–0.5)
Eosinophils Relative: 3 %
HCT: 38 % (ref 36.0–46.0)
Hemoglobin: 12.4 g/dL (ref 12.0–15.0)
Immature Granulocytes: 1 %
Lymphocytes Relative: 19 %
Lymphs Abs: 1.6 10*3/uL (ref 0.7–4.0)
MCH: 31.6 pg (ref 26.0–34.0)
MCHC: 32.6 g/dL (ref 30.0–36.0)
MCV: 96.9 fL (ref 80.0–100.0)
Monocytes Absolute: 0.8 10*3/uL (ref 0.1–1.0)
Monocytes Relative: 9 %
Neutro Abs: 5.6 10*3/uL (ref 1.7–7.7)
Neutrophils Relative %: 67 %
Platelet Count: 486 10*3/uL — ABNORMAL HIGH (ref 150–400)
RBC: 3.92 MIL/uL (ref 3.87–5.11)
RDW: 19.6 % — ABNORMAL HIGH (ref 11.5–15.5)
WBC Count: 8.3 10*3/uL (ref 4.0–10.5)
nRBC: 0 % (ref 0.0–0.2)

## 2019-04-19 LAB — CMP (CANCER CENTER ONLY)
ALT: 12 U/L (ref 0–44)
AST: 23 U/L (ref 15–41)
Albumin: 3.5 g/dL (ref 3.5–5.0)
Alkaline Phosphatase: 100 U/L (ref 38–126)
Anion gap: 13 (ref 5–15)
BUN: 13 mg/dL (ref 8–23)
CO2: 25 mmol/L (ref 22–32)
Calcium: 9.2 mg/dL (ref 8.9–10.3)
Chloride: 102 mmol/L (ref 98–111)
Creatinine: 0.88 mg/dL (ref 0.44–1.00)
GFR, Est AFR Am: >60 mL/min (ref >60)
GFR, Estimated: >60 mL/min (ref >60)
Glucose, Bld: 92 mg/dL (ref 70–99)
Potassium: 4.1 mmol/L (ref 3.5–5.1)
Sodium: 140 mmol/L (ref 135–145)
Total Bilirubin: 0.3 mg/dL (ref 0.3–1.2)
Total Protein: 9.2 g/dL — ABNORMAL HIGH (ref 6.5–8.1)

## 2019-04-19 NOTE — Telephone Encounter (Signed)
Scheduled appt per 4/30 los. ° ° °

## 2019-04-20 ENCOUNTER — Other Ambulatory Visit: Payer: Self-pay

## 2019-04-20 ENCOUNTER — Inpatient Hospital Stay: Payer: Medicare Other | Attending: Obstetrics

## 2019-04-20 VITALS — BP 142/81 | HR 72 | Temp 98.3°F | Resp 18

## 2019-04-20 DIAGNOSIS — I2510 Atherosclerotic heart disease of native coronary artery without angina pectoris: Secondary | ICD-10-CM | POA: Insufficient documentation

## 2019-04-20 DIAGNOSIS — Z79899 Other long term (current) drug therapy: Secondary | ICD-10-CM | POA: Diagnosis not present

## 2019-04-20 DIAGNOSIS — Z7902 Long term (current) use of antithrombotics/antiplatelets: Secondary | ICD-10-CM | POA: Diagnosis not present

## 2019-04-20 DIAGNOSIS — M25552 Pain in left hip: Secondary | ICD-10-CM | POA: Insufficient documentation

## 2019-04-20 DIAGNOSIS — C778 Secondary and unspecified malignant neoplasm of lymph nodes of multiple regions: Secondary | ICD-10-CM | POA: Diagnosis not present

## 2019-04-20 DIAGNOSIS — Z7951 Long term (current) use of inhaled steroids: Secondary | ICD-10-CM | POA: Insufficient documentation

## 2019-04-20 DIAGNOSIS — C7951 Secondary malignant neoplasm of bone: Secondary | ICD-10-CM | POA: Insufficient documentation

## 2019-04-20 DIAGNOSIS — Z7189 Other specified counseling: Secondary | ICD-10-CM

## 2019-04-20 DIAGNOSIS — Z951 Presence of aortocoronary bypass graft: Secondary | ICD-10-CM | POA: Diagnosis not present

## 2019-04-20 DIAGNOSIS — J45909 Unspecified asthma, uncomplicated: Secondary | ICD-10-CM | POA: Diagnosis not present

## 2019-04-20 DIAGNOSIS — I110 Hypertensive heart disease with heart failure: Secondary | ICD-10-CM | POA: Diagnosis not present

## 2019-04-20 DIAGNOSIS — E876 Hypokalemia: Secondary | ICD-10-CM | POA: Insufficient documentation

## 2019-04-20 DIAGNOSIS — C23 Malignant neoplasm of gallbladder: Secondary | ICD-10-CM | POA: Diagnosis present

## 2019-04-20 DIAGNOSIS — Z5111 Encounter for antineoplastic chemotherapy: Secondary | ICD-10-CM | POA: Diagnosis present

## 2019-04-20 LAB — CANCER ANTIGEN 19-9: CA 19-9: 656 U/mL — ABNORMAL HIGH (ref 0–35)

## 2019-04-20 MED ORDER — SODIUM CHLORIDE 0.9 % IV SOLN
1000.0000 mg/m2 | Freq: Once | INTRAVENOUS | Status: AC
  Administered 2019-04-20: 1520 mg via INTRAVENOUS
  Filled 2019-04-20: qty 39.98

## 2019-04-20 MED ORDER — HEPARIN SOD (PORK) LOCK FLUSH 100 UNIT/ML IV SOLN
500.0000 [IU] | Freq: Once | INTRAVENOUS | Status: AC | PRN
  Administered 2019-04-20: 500 [IU]
  Filled 2019-04-20: qty 5

## 2019-04-20 MED ORDER — PALONOSETRON HCL INJECTION 0.25 MG/5ML
INTRAVENOUS | Status: AC
  Filled 2019-04-20: qty 5

## 2019-04-20 MED ORDER — PALONOSETRON HCL INJECTION 0.25 MG/5ML
0.2500 mg | Freq: Once | INTRAVENOUS | Status: AC
  Administered 2019-04-20: 0.25 mg via INTRAVENOUS

## 2019-04-20 MED ORDER — FUROSEMIDE 10 MG/ML IJ SOLN
20.0000 mg | Freq: Once | INTRAMUSCULAR | Status: AC
  Administered 2019-04-20: 20 mg via INTRAVENOUS

## 2019-04-20 MED ORDER — SODIUM CHLORIDE 0.9% FLUSH
10.0000 mL | INTRAVENOUS | Status: DC | PRN
  Administered 2019-04-20: 10 mL
  Filled 2019-04-20: qty 10

## 2019-04-20 MED ORDER — POTASSIUM CHLORIDE 2 MEQ/ML IV SOLN
Freq: Once | INTRAVENOUS | Status: AC
  Administered 2019-04-20: 09:00:00 via INTRAVENOUS
  Filled 2019-04-20: qty 10

## 2019-04-20 MED ORDER — FUROSEMIDE 10 MG/ML IJ SOLN
INTRAMUSCULAR | Status: AC
  Filled 2019-04-20: qty 2

## 2019-04-20 MED ORDER — SODIUM CHLORIDE 0.9 % IV SOLN
Freq: Once | INTRAVENOUS | Status: AC
  Administered 2019-04-20: 08:00:00 via INTRAVENOUS
  Filled 2019-04-20: qty 250

## 2019-04-20 MED ORDER — SODIUM CHLORIDE 0.9 % IV SOLN
25.0000 mg/m2 | Freq: Once | INTRAVENOUS | Status: AC
  Administered 2019-04-20: 38 mg via INTRAVENOUS
  Filled 2019-04-20: qty 38

## 2019-04-20 MED ORDER — SODIUM CHLORIDE 0.9 % IV SOLN
Freq: Once | INTRAVENOUS | Status: AC
  Administered 2019-04-20: 11:00:00 via INTRAVENOUS
  Filled 2019-04-20: qty 5

## 2019-04-20 NOTE — Patient Instructions (Signed)
[facility] Discharge Instructions for Patients Receiving Chemotherapy  Today you received the following chemotherapy agents Gemzar and Cisplatin  To help prevent nausea and vomiting after your treatment, we encourage you to take your nausea medication as directed   If you develop nausea and vomiting that is not controlled by your nausea medication, call the clinic.   BELOW ARE SYMPTOMS THAT SHOULD BE REPORTED IMMEDIATELY:  *FEVER GREATER THAN 100.5 F  *CHILLS WITH OR WITHOUT FEVER  NAUSEA AND VOMITING THAT IS NOT CONTROLLED WITH YOUR NAUSEA MEDICATION  *UNUSUAL SHORTNESS OF BREATH  *UNUSUAL BRUISING OR BLEEDING  TENDERNESS IN MOUTH AND THROAT WITH OR WITHOUT PRESENCE OF ULCERS  *URINARY PROBLEMS  *BOWEL PROBLEMS  UNUSUAL RASH Items with * indicate a potential emergency and should be followed up as soon as possible.  Feel free to call the clinic should you have any questions or concerns. The clinic phone number is [phone].  Please show the CHEMO ALERT CARD at check-in to the Emergency Department and triage nurse.   

## 2019-04-23 ENCOUNTER — Other Ambulatory Visit: Payer: Self-pay

## 2019-04-23 MED ORDER — BISOPROLOL FUMARATE 10 MG PO TABS: 10.0000 mg | ORAL_TABLET | Freq: Every day | ORAL | 1 refills | Status: DC

## 2019-05-01 ENCOUNTER — Other Ambulatory Visit: Payer: Self-pay | Admitting: Hematology

## 2019-05-04 ENCOUNTER — Inpatient Hospital Stay: Payer: Medicare Other

## 2019-05-04 ENCOUNTER — Other Ambulatory Visit: Payer: Self-pay

## 2019-05-04 VITALS — BP 137/75 | HR 72 | Temp 97.8°F | Resp 18

## 2019-05-04 DIAGNOSIS — Z5111 Encounter for antineoplastic chemotherapy: Secondary | ICD-10-CM | POA: Diagnosis not present

## 2019-05-04 DIAGNOSIS — C23 Malignant neoplasm of gallbladder: Secondary | ICD-10-CM

## 2019-05-04 DIAGNOSIS — Z7189 Other specified counseling: Secondary | ICD-10-CM

## 2019-05-04 LAB — CBC WITH DIFFERENTIAL (CANCER CENTER ONLY)
Abs Immature Granulocytes: 0.02 10*3/uL (ref 0.00–0.07)
Basophils Absolute: 0 10*3/uL (ref 0.0–0.1)
Basophils Relative: 1 %
Eosinophils Absolute: 0.2 10*3/uL (ref 0.0–0.5)
Eosinophils Relative: 4 %
HCT: 36.4 % (ref 36.0–46.0)
Hemoglobin: 11.6 g/dL — ABNORMAL LOW (ref 12.0–15.0)
Immature Granulocytes: 0 %
Lymphocytes Relative: 22 %
Lymphs Abs: 1.1 10*3/uL (ref 0.7–4.0)
MCH: 32 pg (ref 26.0–34.0)
MCHC: 31.9 g/dL (ref 30.0–36.0)
MCV: 100.3 fL — ABNORMAL HIGH (ref 80.0–100.0)
Monocytes Absolute: 0.4 10*3/uL (ref 0.1–1.0)
Monocytes Relative: 8 %
Neutro Abs: 3.3 10*3/uL (ref 1.7–7.7)
Neutrophils Relative %: 65 %
Platelet Count: 301 10*3/uL (ref 150–400)
RBC: 3.63 MIL/uL — ABNORMAL LOW (ref 3.87–5.11)
RDW: 17.4 % — ABNORMAL HIGH (ref 11.5–15.5)
WBC Count: 5.1 10*3/uL (ref 4.0–10.5)
nRBC: 0 % (ref 0.0–0.2)

## 2019-05-04 LAB — CMP (CANCER CENTER ONLY)
ALT: 18 U/L (ref 0–44)
AST: 23 U/L (ref 15–41)
Albumin: 3.5 g/dL (ref 3.5–5.0)
Alkaline Phosphatase: 90 U/L (ref 38–126)
Anion gap: 10 (ref 5–15)
BUN: 15 mg/dL (ref 8–23)
CO2: 27 mmol/L (ref 22–32)
Calcium: 9.7 mg/dL (ref 8.9–10.3)
Chloride: 102 mmol/L (ref 98–111)
Creatinine: 0.84 mg/dL (ref 0.44–1.00)
GFR, Est AFR Am: >60 mL/min (ref >60)
GFR, Estimated: >60 mL/min (ref >60)
Glucose, Bld: 97 mg/dL (ref 70–99)
Potassium: 4 mmol/L (ref 3.5–5.1)
Sodium: 139 mmol/L (ref 135–145)
Total Bilirubin: 0.3 mg/dL (ref 0.3–1.2)
Total Protein: 8.7 g/dL — ABNORMAL HIGH (ref 6.5–8.1)

## 2019-05-04 MED ORDER — SODIUM CHLORIDE 0.9 % IV SOLN
25.0000 mg/m2 | Freq: Once | INTRAVENOUS | Status: AC
  Administered 2019-05-04: 13:00:00 38 mg via INTRAVENOUS
  Filled 2019-05-04: qty 38

## 2019-05-04 MED ORDER — HEPARIN SOD (PORK) LOCK FLUSH 100 UNIT/ML IV SOLN
500.0000 [IU] | Freq: Once | INTRAVENOUS | Status: AC | PRN
  Administered 2019-05-04: 17:00:00 500 [IU]
  Filled 2019-05-04: qty 5

## 2019-05-04 MED ORDER — POTASSIUM CHLORIDE 2 MEQ/ML IV SOLN
Freq: Once | INTRAVENOUS | Status: AC
  Administered 2019-05-04: 10:00:00 via INTRAVENOUS
  Filled 2019-05-04: qty 10

## 2019-05-04 MED ORDER — FUROSEMIDE 10 MG/ML IJ SOLN
20.0000 mg | Freq: Once | INTRAMUSCULAR | Status: AC
  Administered 2019-05-04: 12:00:00 20 mg via INTRAVENOUS

## 2019-05-04 MED ORDER — SODIUM CHLORIDE 0.9 % IV SOLN
Freq: Once | INTRAVENOUS | Status: AC
  Administered 2019-05-04: 12:00:00 via INTRAVENOUS
  Filled 2019-05-04: qty 5

## 2019-05-04 MED ORDER — PALONOSETRON HCL INJECTION 0.25 MG/5ML
0.2500 mg | Freq: Once | INTRAVENOUS | Status: AC
  Administered 2019-05-04: 12:00:00 0.25 mg via INTRAVENOUS

## 2019-05-04 MED ORDER — SODIUM CHLORIDE 0.9% FLUSH
10.0000 mL | Freq: Once | INTRAVENOUS | Status: AC | PRN
  Administered 2019-05-04: 10 mL
  Filled 2019-05-04: qty 10

## 2019-05-04 MED ORDER — SODIUM CHLORIDE 0.9% FLUSH
10.0000 mL | INTRAVENOUS | Status: DC | PRN
  Administered 2019-05-04: 17:00:00 10 mL
  Filled 2019-05-04: qty 10

## 2019-05-04 MED ORDER — ZOLEDRONIC ACID 4 MG/100ML IV SOLN
4.0000 mg | Freq: Once | INTRAVENOUS | Status: AC
  Administered 2019-05-04: 13:00:00 4 mg via INTRAVENOUS
  Filled 2019-05-04: qty 100

## 2019-05-04 MED ORDER — SODIUM CHLORIDE 0.9 % IV SOLN
Freq: Once | INTRAVENOUS | Status: AC
  Administered 2019-05-04: 10:00:00 via INTRAVENOUS
  Filled 2019-05-04: qty 250

## 2019-05-04 MED ORDER — SODIUM CHLORIDE 0.9 % IV SOLN
1000.0000 mg/m2 | Freq: Once | INTRAVENOUS | Status: AC
  Administered 2019-05-04: 14:00:00 1520 mg via INTRAVENOUS
  Filled 2019-05-04: qty 39.98

## 2019-05-04 MED ORDER — FUROSEMIDE 10 MG/ML IJ SOLN
INTRAMUSCULAR | Status: AC
  Filled 2019-05-04: qty 2

## 2019-05-04 MED ORDER — PALONOSETRON HCL INJECTION 0.25 MG/5ML
INTRAVENOUS | Status: AC
  Filled 2019-05-04: qty 5

## 2019-05-04 NOTE — Patient Instructions (Addendum)
[facility] Discharge Instructions for Patients Receiving Chemotherapy  Today you received the following chemotherapy agents Gemzar and Cisplatin.  To help prevent nausea and vomiting after your treatment, we encourage you to take your nausea medication as directed.  If you develop nausea and vomiting that is not controlled by your nausea medication, call the clinic.   BELOW ARE SYMPTOMS THAT SHOULD BE REPORTED IMMEDIATELY:  *FEVER GREATER THAN 100.5 F  *CHILLS WITH OR WITHOUT FEVER  NAUSEA AND VOMITING THAT IS NOT CONTROLLED WITH YOUR NAUSEA MEDICATION  *UNUSUAL SHORTNESS OF BREATH  *UNUSUAL BRUISING OR BLEEDING  TENDERNESS IN MOUTH AND THROAT WITH OR WITHOUT PRESENCE OF ULCERS  *URINARY PROBLEMS  *BOWEL PROBLEMS  UNUSUAL RASH Items with * indicate a potential emergency and should be followed up as soon as possible.  Feel free to call the clinic should you have any questions or concerns. The clinic phone number is [phone].  Please show the [place] at check-in to the Emergency Department and triage nurse.  Zoledronic Acid injection (Hypercalcemia, Oncology) What is this medicine? ZOLEDRONIC ACID (ZOE le dron ik AS id) lowers the amount of calcium loss from bone. It is used to treat too much calcium in your blood from cancer. It is also used to prevent complications of cancer that has spread to the bone. This medicine may be used for other purposes; ask your health care provider or pharmacist if you have questions. COMMON BRAND NAME(S): Zometa What should I tell my health care provider before I take this medicine? They need to know if you have any of these conditions: -aspirin-sensitive asthma -cancer, especially if you are receiving medicines used to treat cancer -dental disease or wear dentures -infection -kidney disease -receiving corticosteroids like dexamethasone or prednisone -an unusual or allergic reaction to zoledronic acid, other  medicines, foods, dyes, or preservatives -pregnant or trying to get pregnant -breast-feeding How should I use this medicine? This medicine is for infusion into a vein. It is given by a health care professional in a hospital or clinic setting. Talk to your pediatrician regarding the use of this medicine in children. Special care may be needed. Overdosage: If you think you have taken too much of this medicine contact a poison control center or emergency room at once. NOTE: This medicine is only for you. Do not share this medicine with others. What if I miss a dose? It is important not to miss your dose. Call your doctor or health care professional if you are unable to keep an appointment. What may interact with this medicine? -certain antibiotics given by injection -NSAIDs, medicines for pain and inflammation, like ibuprofen or naproxen -some diuretics like bumetanide, furosemide -teriparatide -thalidomide This list may not describe all possible interactions. Give your health care provider a list of all the medicines, herbs, non-prescription drugs, or dietary supplements you use. Also tell them if you smoke, drink alcohol, or use illegal drugs. Some items may interact with your medicine. What should I watch for while using this medicine? Visit your doctor or health care professional for regular checkups. It may be some time before you see the benefit from this medicine. Do not stop taking your medicine unless your doctor tells you to. Your doctor may order blood tests or other tests to see how you are doing. Women should inform their doctor if they wish to become pregnant or think they might be pregnant. There is a potential for serious side effects to an unborn child. Talk  to your health care professional or pharmacist for more information. You should make sure that you get enough calcium and vitamin D while you are taking this medicine. Discuss the foods you eat and the vitamins you take with  your health care professional. Some people who take this medicine have severe bone, joint, and/or muscle pain. This medicine may also increase your risk for jaw problems or a broken thigh bone. Tell your doctor right away if you have severe pain in your jaw, bones, joints, or muscles. Tell your doctor if you have any pain that does not go away or that gets worse. Tell your dentist and dental surgeon that you are taking this medicine. You should not have major dental surgery while on this medicine. See your dentist to have a dental exam and fix any dental problems before starting this medicine. Take good care of your teeth while on this medicine. Make sure you see your dentist for regular follow-up appointments. What side effects may I notice from receiving this medicine? Side effects that you should report to your doctor or health care professional as soon as possible: -allergic reactions like skin rash, itching or hives, swelling of the face, lips, or tongue -anxiety, confusion, or depression -breathing problems -changes in vision -eye pain -feeling faint or lightheaded, falls -jaw pain, especially after dental work -mouth sores -muscle cramps, stiffness, or weakness -redness, blistering, peeling or loosening of the skin, including inside the mouth -trouble passing urine or change in the amount of urine Side effects that usually do not require medical attention (report to your doctor or health care professional if they continue or are bothersome): -bone, joint, or muscle pain -constipation -diarrhea -fever -hair loss -irritation at site where injected -loss of appetite -nausea, vomiting -stomach upset -trouble sleeping -trouble swallowing -weak or tired This list may not describe all possible side effects. Call your doctor for medical advice about side effects. You may report side effects to FDA at 1-800-FDA-1088. Where should I keep my medicine? This drug is given in a hospital or  clinic and will not be stored at home. NOTE: This sheet is a summary. It may not cover all possible information. If you have questions about this medicine, talk to your doctor, pharmacist, or health care provider.  2019 Elsevier/Gold Standard (2014-05-04 14:19:39)

## 2019-05-16 ENCOUNTER — Other Ambulatory Visit: Payer: Self-pay | Admitting: Internal Medicine

## 2019-05-16 MED ORDER — MONTELUKAST SODIUM 10 MG PO TABS: ORAL_TABLET | ORAL | 1 refills | Status: DC

## 2019-05-16 NOTE — Progress Notes (Signed)
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 Lymph node, biopsy, mediastinal - LYMPH NODE WITH METASTATIC ADENOCARCINOMA. - SEE MICROSCOPIC DESCRIPTION 2. Plaque, coronary artery - CALCIFIED ATHEROSCLEROTIC PLAQUE.    10/14/2018 Miscellaneous    Foundation One:  MSI stable tumor mutation burden 3Muts/mb ERBB2 amplification CCNE1 amplification TP53 mutation(+) CDK6 amplification  HGF amplification     11/13/2018 Cancer Staging    Staging form: Gallbladder, AJCC 8th Edition - Clinical stage from 11/13/2018: Stage IVB (cTX, cN2, pM1) - Signed by Truitt Merle, MD on 12/15/2018    11/17/2018 Imaging    11/17/2018 CT CAP IMPRESSION: 1. 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 This is associated with extensive upper abdominal and retroperitoneal lymphadenopathy, as well as metastatic lymphadenopathy in the posterior mediastinum and left supraclavicular region. There is also a metastatic lesion to the left ischium. 2. 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 3. Aortic atherosclerosis, in addition to left main and 3 vessel coronary artery disease. Status post median sternotomy for CABG including LIMA to the LAD. 4. There are calcifications of the aortic valve. Echocardiographic correlation for evaluation  of potential valvular dysfunction may be warranted if clinically indicated.     11/21/2018 Initial Diagnosis    Gallbladder cancer ([place])    11/27/2018 Pathology Results    11/27/2018 CA19-9 immunohistochemical stain Per request, a CA19-9 immunohistochemical stain was performed at an outside institution revealing positive staining in the tumor cells.     12/15/2018 -  Chemotherapy     first line chemo cisplatin and gemcitabine every 2 weeks on 12/15/18    02/20/2019 PET scan    Restaging PET scan:  IMPRESSION: 1. Positive response to therapy at all primary and metastatic sites. Persistent primary and metastatic lesions do have intense hypermetabolic activity albeit reduced. 2. Decrease in size and hypermetabolic activity of mass lesion in the gallbladder fundus. 3. Interval decrease in size and metabolic activity of periportal adenopathy. 4. Decrease in size and metabolic activity of periaortic lymph nodes. 5. Interval decrease in size and metabolic activity of destructive lesion in the LEFT inferior pubic ramus with new sclerosis. 6. No new or progressive disease.      CURRENT THERAPY:  First linechemo cisplatin and gemcitabineevery 2 weekson 12/15/18. Had 1 month chemo break in April, restarted on 04/20/19  INTERVAL HISTORY:  Cassie Campbell is here for a follow up of treatment. She presents to the clinic alone. She notes she is doing well. She has some left hip joint pain, but this is manageable. She notes she had 2 days of nausea after last chemo. I reviewed her medication list with her. She is taking 2 tabs of lasix and she is not currently taking pain meds. She notes she is wondering can she take Shingles vaccine while on treatment.Since COVID-19 she has been driving herself to her treatments and appointments.  She plans to see her Gyn next week.    REVIEW OF SYSTEMS:   Constitutional: Denies fevers, chills or abnormal weight loss Eyes: Denies blurriness of vision Ears,  nose, mouth, throat, and face: Denies mucositis or sore throat Respiratory: Denies cough, dyspnea or wheezes Cardiovascular: Denies palpitation, chest discomfort or lower extremity swelling Gastrointestinal:  Denies nausea, heartburn or change in bowel habits Skin: Denies abnormal skin rashes MSK: (+) Left hip joint pain  Lymphatics: Denies new lymphadenopathy or easy bruising Neurological:Denies numbness, tingling or new weaknesses Behavioral/Psych: Mood is stable, no new changes  All other systems were reviewed with the patient and are negative.  MEDICAL HISTORY:  Past Medical History:  Diagnosis Date  . Allergic rhinitis   . Arthritis   . Asthma    xolair s 8/05 ?11/07; mastered hfa 12/20/08  . Benign positional vertigo   . CAD (coronary artery disease)    a. 09/2014 NSTEMI s/p LHC with sig 2V dz. dLAD diffusely diseased and not suitable for PCI. unsuccessful RCA PCI d/t heavy calcifications  . GERD (gastroesophageal reflux disease)   . Heart attack ([place])    09/2014  . Hyperlipidemia    <130 ldl pos fm hx, bp  . Hypertension   . Osteopenia    dexa 08/22/07 AP spine + 1.1, left femur -1.3, right femur -.8; dexa 10/06/09 +1.6, left femur =1.6, right femur -.  . PONV (postoperative nausea and vomiting)   . Ruptured disc, cervical   . 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 BREAST SURGERY  10-26-10   Rt. breast bx--for microcalcifications in rt. retroareolar region--dx was hyalinized fibroadenoma  . BUNIONECTOMY Bilateral   . CARDIAC CATHETERIZATION     2015  . CATARACT EXTRACTION Right 02/02/2018   Dr Satira Sark  . CATARACT EXTRACTION Left 03/30/2018  . [place] SURGERY  12/01  . CORONARY ARTERY BYPASS GRAFT N/A 10/04/2018   Procedure: CORONARY ARTERY BYPASS GRAFTING (CABG) times 2 using left  Internal mammary artery to LAD, left greater saphenous vein - open harvest.;   Surgeon: [name], MD;  Location: [place];  Service: Open Heart Surgery;  Laterality: N/A;  . IR IMAGING GUIDED PORT INSERTION  12/08/2018  . LEFT HEART CATH AND CORONARY ANGIOGRAPHY N/A 09/27/2018   Procedure: LEFT HEART CATH AND CORONARY ANGIOGRAPHY;  Surgeon: Troy Sine, MD;  Location: [place] CV LAB;  Service: Cardiovascular;  Laterality: N/A;  . LEFT HEART CATHETERIZATION WITH CORONARY ANGIOGRAM N/A 10/16/2014   Procedure: LEFT HEART CATHETERIZATION WITH CORONARY ANGIOGRAM;  Surgeon: Blane Ohara, MD;  Location: [facility] CATH LAB;  Service: Cardiovascular;  Laterality: N/A;  . Left L4-5 transforaminal lumbar interbody fusion with Depuy cage, rods and screws, local and allograft bone graft, Vivigen; bilateral decompression/partial hemilaminectomy lumbar five-sacral one  07/2017  . PERCUTANEOUS CORONARY ROTOBLATOR INTERVENTION (PCI-R) N/A 10/17/2014   Procedure: PERCUTANEOUS CORONARY ROTOBLATOR INTERVENTION (PCI-R);  Surgeon: Troy Sine, MD;  Location: [facility] CATH LAB;  Service: Cardiovascular;  Laterality: N/A;  . TEE WITHOUT CARDIOVERSION N/A 10/04/2018   Procedure: TRANSESOPHAGEAL ECHOCARDIOGRAM (TEE);  Surgeon: [name], MD;  Location: [place];  Service: Open Heart Surgery;  Laterality: N/A;  . VEIN LIGATION AND STRIPPING Left   . VIDEO BRONCHOSCOPY Bilateral 02/05/2015   Procedure: VIDEO BRONCHOSCOPY WITHOUT FLUORO;  Surgeon: Tanda Rockers, MD;  Location: [facility] ENDOSCOPY;  Service: Endoscopy;  Laterality: Bilateral;  . VULVA /  PERINEUM BIOPSY  12-30-10   --epidermoid cyst    I have reviewed the social history and family history with the patient and they are unchanged from previous note.  ALLERGIES:  is allergic to fish-derived products; penicillins; aspirin; brilinta [ticagrelor]; codeine phosphate; and diphenhydramine hcl.  MEDICATIONS:  Current Outpatient Medications  Medication Sig Dispense Refill  . albuterol (PROAIR HFA) 108 (90 Base) MCG/ACT inhaler  INHALE 1 TO 2 PUFFS BY MOUTH EVERY 4 HOURS AS NEEDED FOR WHEEZING 8.5 g 3  . atorvastatin (LIPITOR) 80 MG tablet TAKE 1 TABLET(80 MG) BY MOUTH DAILY 90 tablet 1  . bisoprolol (ZEBETA) 10 MG tablet Take 1 tablet (10 mg total) by mouth daily. 90 tablet 1  . celecoxib (CELEBREX) 200 MG capsule Take 200 mg by mouth 2 (two) times daily as needed.    . cetirizine (ZYRTEC) 10 MG tablet Take 10 mg by mouth at bedtime as needed for allergies.    . cholecalciferol 2000 units TABS Take 1 tablet (2,000 Units total) by mouth daily. 30 tablet 3  . clopidogrel (PLAVIX) 75 MG tablet Take 1 tablet (75 mg total) by mouth daily. 90 tablet 3  . dextromethorphan-guaiFENesin (MUCINEX DM) 30-600 MG per 12 hr tablet Take 1-2 tablets by mouth every 12 (twelve) hours as needed for cough (with flutter).     . famotidine (PEPCID) 20 MG tablet Take 20 mg by mouth as needed.     . ferrous sulfate 325 (65 FE) MG tablet Take 325 mg by mouth 2 (two) times daily with a meal.    . fluticasone (FLONASE) 50 MCG/ACT nasal spray Place 1-2 sprays into both nostrils 2 (two) times daily as needed for allergies or rhinitis.    Marland Kitchen HYDROcodone-acetaminophen (NORCO/VICODIN) 5-325 MG tablet Take 1 tablet by mouth every 6 (six) hours as needed for moderate pain. 30 tablet 0  . lidocaine-prilocaine (EMLA) cream Apply to affected area once 30 g 3  . magnesium oxide (MAG-OX) 400 (241.3 Mg) MG tablet Take 1 tablet (400 mg total) by mouth daily. 90 tablet 1  . meclizine (ANTIVERT) 25 MG tablet TAKE 2 TABLETS BY MOUTH THREE TIMES DAILY AS NEEDED 24 tablet 2  . montelukast (SINGULAIR) 10 MG tablet TAKE 1 TABLET(10 MG) BY MOUTH AT BEDTIME 90 tablet 1  . Multiple Vitamin (MULTIVITAMIN) capsule Take 1 capsule by mouth daily.     Marland Kitchen omeprazole (PRILOSEC) 20 MG capsule Take 20 mg by mouth as needed.     . ondansetron (ZOFRAN) 8 MG tablet Take 1 tablet (8 mg total) by mouth 2 (two) times daily as needed. Start on the third day after chemotherapy. 30 tablet 1   . potassium chloride SA (K-DUR,KLOR-CON) 20 MEQ tablet Take 1 tablet (20 mEq total) by mouth 2 (two) times daily. 60 tablet 2  . prochlorperazine (COMPAZINE) 10 MG tablet Take 1 tablet (10 mg total) by mouth every 6 (six) hours as needed (Nausea or vomiting). 30 tablet 1  . SYMBICORT 160-4.5 MCG/ACT inhaler INHALE 2 PUFFS BY MOUTH EVERY 12 HOURS 10.2 g 11  . furosemide (LASIX) 20 MG tablet Take 2 tablets (40 mg total) by mouth daily. 180 tablet 3   No current facility-administered medications for this visit.     PHYSICAL EXAMINATION: ECOG PERFORMANCE STATUS: 1 - Symptomatic but completely ambulatory  Vitals:   05/17/19 1018  BP: 127/80  Pulse: 80  Resp: 18  Temp: 98 F (36.7 C)  SpO2: 99%   Filed Weights   05/17/19 1018  Weight:  124 lb 3.2 oz (56.3 kg)    GENERAL:alert, no distress and comfortable SKIN: skin color, texture, turgor are normal, no rashes or significant lesions EYES: normal, Conjunctiva are pink and non-injected, sclera clear  NECK: supple, thyroid normal size, non-tender, without nodularity LYMPH:  no palpable lymphadenopathy in the cervical, axillary  LUNGS: clear to auscultation and percussion with normal breathing effort HEART: regular rate & rhythm and no murmurs and no lower extremity edema ABDOMEN:abdomen soft, non-tender and normal bowel sounds Musculoskeletal:no cyanosis of digits and no clubbing  NEURO: alert & oriented x 3 with fluent speech, no focal motor/sensory deficits  LABORATORY DATA:  I have reviewed the data as listed CBC Latest Ref Rng & Units 05/17/2019 05/04/2019 04/19/2019  WBC 4.0 - 10.5 K/uL 6.7 5.1 8.3  Hemoglobin 12.0 - 15.0 g/dL 12.1 11.6(L) 12.4  Hematocrit 36.0 - 46.0 % 39.0 36.4 38.0  Platelets 150 - 400 K/uL 263 301 486(H)     CMP Latest Ref Rng & Units 05/17/2019 05/04/2019 04/19/2019  Glucose 70 - 99 mg/dL 87 97 92  BUN 8 - 23 mg/dL 11 15 13   Creatinine 0.44 - 1.00 mg/dL 0.98 0.84 0.88  Sodium 135 - 145 mmol/L 140 139 140   Potassium 3.5 - 5.1 mmol/L 4.0 4.0 4.1  Chloride 98 - 111 mmol/L 103 102 102  CO2 22 - 32 mmol/L 27 27 25   Calcium 8.9 - 10.3 mg/dL 9.0 9.7 9.2  Total Protein 6.5 - 8.1 g/dL 8.7(H) 8.7(H) 9.2(H)  Total Bilirubin 0.3 - 1.2 mg/dL 0.3 0.3 0.3  Alkaline Phos 38 - 126 U/L 84 90 100  AST 15 - 41 U/L 27 23 23   ALT 0 - 44 U/L 20 18 12       RADIOGRAPHIC STUDIES: I have personally reviewed the radiological images as listed and agreed with the findings in the report. No results found.   ASSESSMENT & PLAN:  Cassie Campbell is a 78 y.o. 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 Tolerating well. -[employer] testing results showed MSI stable disease, low tumor mutation burden,notargetable mutation such asFGFR fusion or IDH mutation, but (+) HER2 amplification, may try Herceptin in the future. -She had good partial response based on decreasing Ca 19.9 and recent 02/2019 PET scan findings.  -She is clinically doing well and stable, tolerating chemo very well.  -Labs reviewed, CBC and CMP WNL except protein 8.7, Mag 1.4, CA 19.9 still pending. Overall adequate to proceed with Gem/Cis tomorrow and continue every 2 weeks -Continue oral magnesium and potassium.  -Plans to scan in July, in 2 or 4 weeks depends on her CA19.9 result from today (it slightly increased last month)  -f/u with me in 2 weeks   2. CAD, S/P CABGX2 on 09/27/2018, EF 40-45% -Currently on Plavix. She also takes lasix during chemo. -f/u with Dr. Claiborne Billings -stable,she appears to be euvolemic  3. Asthma -Currently on Symbicort, Singulair, and albuterol as needed -f/u with pulmonary, stable, no recent flare  4.Goal of care discussion -The patient understands the  goal of care is palliative. -she is full code for now  5.Hypokalemiaand hypomagnesemia  -Likely secondary to lasix. Currently on KCL 1mq and mag-ox 4029monce daily -K has been normal lately, Mg still low, will give 2g iv mag tomorrow and increase oral  mag   6. Mild Anemia -Secondary to chemoand her recent open heart surgery -resolved currently due to the chemo break    Plan -Labs reviewed and adequate to proceed with gem/Cis tomorrow and continue every 2 weeks -2g IV Mag tomorrow and incrase mag-ox to twice daily  -f/u in 2 weeks  -restaging CT in 2-4 weeks     No problem-specific Assessment & Plan notes found for this encounter.   Orders Placed This Encounter  Procedures  . CT Abdomen Pelvis W Contrast    Standing Status:   Future    Standing Expiration Date:   05/16/2020    Order Specific Question:   If indicated for the ordered procedure, I authorize the administration of contrast media per Radiology protocol    Answer:   Yes    Order Specific Question:   Preferred imaging location?    Answer:   [facility]    Order Specific Question:   Is Oral Contrast requested for this exam?    Answer:   Yes, Per Radiology protocol    Order Specific Question:   Radiology Contrast Protocol - do NOT remove file path    Answer:   \\[email or web address]  . CT Chest W Contrast    Standing Status:   Future    Standing Expiration Date:   05/16/2020    Order Specific Question:   If indicated for the ordered procedure, I authorize the administration of contrast media per Radiology protocol    Answer:   Yes    Order Specific Question:   Preferred imaging location?    Answer:   [facility]    Order Specific Question:   Radiology Contrast Protocol - do NOT remove file path    Answer:   \\[email or web address]   All questions were answered. The patient knows to call the clinic with any problems, questions or concerns. No  barriers to learning was detected. I spent 20 minutes counseling the patient face to face. The total time spent in the appointment was 25 minutes and more than 50% was on counseling and review of test results     Truitt Merle, MD 05/17/2019   I, Joslyn Devon, am acting as scribe for Truitt Merle, MD.   I have reviewed the above documentation for accuracy and completeness, and I agree with the above.

## 2019-05-17 ENCOUNTER — Other Ambulatory Visit: Payer: Self-pay

## 2019-05-17 ENCOUNTER — Inpatient Hospital Stay: Payer: Medicare Other

## 2019-05-17 ENCOUNTER — Inpatient Hospital Stay (HOSPITAL_BASED_OUTPATIENT_CLINIC_OR_DEPARTMENT_OTHER): Payer: Medicare Other | Admitting: Hematology

## 2019-05-17 ENCOUNTER — Encounter: Payer: Self-pay | Admitting: Hematology

## 2019-05-17 VITALS — BP 127/80 | HR 80 | Temp 98.0°F | Resp 18 | Ht 59.0 in | Wt 124.2 lb

## 2019-05-17 DIAGNOSIS — C7951 Secondary malignant neoplasm of bone: Secondary | ICD-10-CM | POA: Diagnosis not present

## 2019-05-17 DIAGNOSIS — C778 Secondary and unspecified malignant neoplasm of lymph nodes of multiple regions: Secondary | ICD-10-CM

## 2019-05-17 DIAGNOSIS — I10 Essential (primary) hypertension: Secondary | ICD-10-CM

## 2019-05-17 DIAGNOSIS — C23 Malignant neoplasm of gallbladder: Secondary | ICD-10-CM

## 2019-05-17 DIAGNOSIS — Z951 Presence of aortocoronary bypass graft: Secondary | ICD-10-CM

## 2019-05-17 DIAGNOSIS — J45909 Unspecified asthma, uncomplicated: Secondary | ICD-10-CM

## 2019-05-17 DIAGNOSIS — Z5111 Encounter for antineoplastic chemotherapy: Secondary | ICD-10-CM | POA: Diagnosis not present

## 2019-05-17 DIAGNOSIS — E876 Hypokalemia: Secondary | ICD-10-CM

## 2019-05-17 DIAGNOSIS — I2510 Atherosclerotic heart disease of native coronary artery without angina pectoris: Secondary | ICD-10-CM

## 2019-05-17 DIAGNOSIS — Z7902 Long term (current) use of antithrombotics/antiplatelets: Secondary | ICD-10-CM

## 2019-05-17 DIAGNOSIS — M25552 Pain in left hip: Secondary | ICD-10-CM

## 2019-05-17 DIAGNOSIS — Z79899 Other long term (current) drug therapy: Secondary | ICD-10-CM

## 2019-05-17 DIAGNOSIS — Z7951 Long term (current) use of inhaled steroids: Secondary | ICD-10-CM

## 2019-05-17 LAB — CMP (CANCER CENTER ONLY)
ALT: 20 U/L (ref 0–44)
AST: 27 U/L (ref 15–41)
Albumin: 3.5 g/dL (ref 3.5–5.0)
Alkaline Phosphatase: 84 U/L (ref 38–126)
Anion gap: 10 (ref 5–15)
BUN: 11 mg/dL (ref 8–23)
CO2: 27 mmol/L (ref 22–32)
Calcium: 9 mg/dL (ref 8.9–10.3)
Chloride: 103 mmol/L (ref 98–111)
Creatinine: 0.98 mg/dL (ref 0.44–1.00)
GFR, Est AFR Am: >60 mL/min (ref >60)
GFR, Estimated: 56 mL/min — ABNORMAL LOW (ref >60)
Glucose, Bld: 87 mg/dL (ref 70–99)
Potassium: 4 mmol/L (ref 3.5–5.1)
Sodium: 140 mmol/L (ref 135–145)
Total Bilirubin: 0.3 mg/dL (ref 0.3–1.2)
Total Protein: 8.7 g/dL — ABNORMAL HIGH (ref 6.5–8.1)

## 2019-05-17 LAB — CBC WITH DIFFERENTIAL (CANCER CENTER ONLY)
Abs Immature Granulocytes: 0.02 10*3/uL (ref 0.00–0.07)
Basophils Absolute: 0 10*3/uL (ref 0.0–0.1)
Basophils Relative: 0 %
Eosinophils Absolute: 0.1 10*3/uL (ref 0.0–0.5)
Eosinophils Relative: 1 %
HCT: 39 % (ref 36.0–46.0)
Hemoglobin: 12.1 g/dL (ref 12.0–15.0)
Immature Granulocytes: 0 %
Lymphocytes Relative: 20 %
Lymphs Abs: 1.3 10*3/uL (ref 0.7–4.0)
MCH: 32.2 pg (ref 26.0–34.0)
MCHC: 31 g/dL (ref 30.0–36.0)
MCV: 103.7 fL — ABNORMAL HIGH (ref 80.0–100.0)
Monocytes Absolute: 0.6 10*3/uL (ref 0.1–1.0)
Monocytes Relative: 10 %
Neutro Abs: 4.6 10*3/uL (ref 1.7–7.7)
Neutrophils Relative %: 69 %
Platelet Count: 263 10*3/uL (ref 150–400)
RBC: 3.76 MIL/uL — ABNORMAL LOW (ref 3.87–5.11)
RDW: 16.5 % — ABNORMAL HIGH (ref 11.5–15.5)
WBC Count: 6.7 10*3/uL (ref 4.0–10.5)
nRBC: 0 % (ref 0.0–0.2)

## 2019-05-17 LAB — MAGNESIUM: Magnesium: 1.4 mg/dL — CL (ref 1.7–2.4)

## 2019-05-17 MED ORDER — SODIUM CHLORIDE 0.9% FLUSH
3.0000 mL | Freq: Once | INTRAVENOUS | Status: DC | PRN
  Filled 2019-05-17: qty 10

## 2019-05-17 NOTE — Progress Notes (Signed)
Patient refused to have port accessed today since treatment is scheduled tomorrow. She was brought back to the lab for peripheral lab draw.

## 2019-05-17 NOTE — Progress Notes (Signed)
CRITICAL VALUE STICKER  CRITICAL VALUE:  Magnesium 1.4  RECEIVER (on-site recipient of call):  Valda Favia RN  Elias-Fela Solis NOTIFIED: 05/17/2019  1045 MESSENGER (representative from lab):  Vaughan Basta  MD NOTIFIED: Dr. Burr Medico  TIME OF NOTIFICATION:05/28/202001045  RESPONSE:  Will give Mag 2 gm with infusion 5/29

## 2019-05-18 ENCOUNTER — Other Ambulatory Visit: Payer: Self-pay

## 2019-05-18 ENCOUNTER — Inpatient Hospital Stay: Payer: Medicare Other

## 2019-05-18 ENCOUNTER — Telehealth: Payer: Self-pay | Admitting: Hematology

## 2019-05-18 VITALS — BP 133/78 | HR 76 | Temp 98.4°F | Resp 18

## 2019-05-18 DIAGNOSIS — Z7189 Other specified counseling: Secondary | ICD-10-CM

## 2019-05-18 DIAGNOSIS — C23 Malignant neoplasm of gallbladder: Secondary | ICD-10-CM

## 2019-05-18 DIAGNOSIS — Z5111 Encounter for antineoplastic chemotherapy: Secondary | ICD-10-CM | POA: Diagnosis not present

## 2019-05-18 LAB — CANCER ANTIGEN 19-9: CA 19-9: 290 U/mL — ABNORMAL HIGH (ref 0–35)

## 2019-05-18 MED ORDER — SODIUM CHLORIDE 0.9 % IV SOLN
Freq: Once | INTRAVENOUS | Status: AC
  Administered 2019-05-18: 12:00:00 via INTRAVENOUS
  Filled 2019-05-18: qty 5

## 2019-05-18 MED ORDER — PALONOSETRON HCL INJECTION 0.25 MG/5ML
INTRAVENOUS | Status: AC
  Filled 2019-05-18: qty 5

## 2019-05-18 MED ORDER — SODIUM CHLORIDE 0.9 % IV SOLN
Freq: Once | INTRAVENOUS | Status: AC
  Administered 2019-05-18: 09:00:00 via INTRAVENOUS
  Filled 2019-05-18: qty 250

## 2019-05-18 MED ORDER — FUROSEMIDE 10 MG/ML IJ SOLN
20.0000 mg | Freq: Once | INTRAMUSCULAR | Status: AC
  Administered 2019-05-18: 12:00:00 20 mg via INTRAVENOUS

## 2019-05-18 MED ORDER — SODIUM CHLORIDE 0.9 % IV SOLN
1000.0000 mg/m2 | Freq: Once | INTRAVENOUS | Status: AC
  Administered 2019-05-18: 13:00:00 1520 mg via INTRAVENOUS
  Filled 2019-05-18: qty 39.98

## 2019-05-18 MED ORDER — PALONOSETRON HCL INJECTION 0.25 MG/5ML
0.2500 mg | Freq: Once | INTRAVENOUS | Status: AC
  Administered 2019-05-18: 0.25 mg via INTRAVENOUS

## 2019-05-18 MED ORDER — FUROSEMIDE 10 MG/ML IJ SOLN
INTRAMUSCULAR | Status: AC
  Filled 2019-05-18: qty 2

## 2019-05-18 MED ORDER — SODIUM CHLORIDE 0.9% FLUSH
10.0000 mL | INTRAVENOUS | Status: DC | PRN
  Administered 2019-05-18: 16:00:00 10 mL
  Filled 2019-05-18: qty 10

## 2019-05-18 MED ORDER — HEPARIN SOD (PORK) LOCK FLUSH 100 UNIT/ML IV SOLN
500.0000 [IU] | Freq: Once | INTRAVENOUS | Status: AC | PRN
  Administered 2019-05-18: 16:00:00 500 [IU]
  Filled 2019-05-18: qty 5

## 2019-05-18 MED ORDER — SODIUM CHLORIDE 0.9 % IV SOLN
25.0000 mg/m2 | Freq: Once | INTRAVENOUS | Status: AC
  Administered 2019-05-18: 14:00:00 38 mg via INTRAVENOUS
  Filled 2019-05-18: qty 38

## 2019-05-18 MED ORDER — POTASSIUM CHLORIDE 2 MEQ/ML IV SOLN
Freq: Once | INTRAVENOUS | Status: AC
  Administered 2019-05-18: 10:00:00 via INTRAVENOUS
  Filled 2019-05-18: qty 10

## 2019-05-18 NOTE — Telephone Encounter (Signed)
No los per 5/28. °

## 2019-05-18 NOTE — Patient Instructions (Addendum)
[place] Discharge Instructions for Patients Receiving Chemotherapy  Today you received the following chemotherapy agents Gemzar and Cisplatin.  To help prevent nausea and vomiting after your treatment, we encourage you to take your nausea medication as directed.  If you develop nausea and vomiting that is not controlled by your nausea medication, call the clinic.   BELOW ARE SYMPTOMS THAT SHOULD BE REPORTED IMMEDIATELY:  *FEVER GREATER THAN 100.5 F  *CHILLS WITH OR WITHOUT FEVER  NAUSEA AND VOMITING THAT IS NOT CONTROLLED WITH YOUR NAUSEA MEDICATION  *UNUSUAL SHORTNESS OF BREATH  *UNUSUAL BRUISING OR BLEEDING  TENDERNESS IN MOUTH AND THROAT WITH OR WITHOUT PRESENCE OF ULCERS  *URINARY PROBLEMS  *BOWEL PROBLEMS  UNUSUAL RASH Items with * indicate a potential emergency and should be followed up as soon as possible.  Feel free to call the clinic should you have any questions or concerns. The clinic phone number is [phone].  Please show the [place] at check-in to the Emergency Department and triage nurse.  [place] [place] Discharge Instructions for Patients Receiving Chemotherapy  Today you received the following chemotherapy agents: Gemzar and Cisplatin.  To help prevent nausea and vomiting after your treatment, we encourage you to take your nausea medication as directed.  If you develop nausea and vomiting that is not controlled by your nausea medication, call the clinic.   BELOW ARE SYMPTOMS THAT SHOULD BE REPORTED IMMEDIATELY:  *FEVER GREATER THAN 100.5 F  *CHILLS WITH OR WITHOUT FEVER  NAUSEA AND VOMITING THAT IS NOT CONTROLLED WITH YOUR NAUSEA MEDICATION  *UNUSUAL SHORTNESS OF BREATH  *UNUSUAL BRUISING OR BLEEDING  TENDERNESS IN MOUTH AND THROAT WITH OR WITHOUT PRESENCE OF ULCERS  *URINARY PROBLEMS  *BOWEL PROBLEMS  UNUSUAL RASH Items with * indicate a potential emergency and should be followed up as soon as  possible.  Feel free to call the clinic should you have any questions or concerns. The clinic phone number is [phone].  Please show the [place] at check-in to the Emergency Department and triage nurse.  Coronavirus (COVID-19) Are you at risk?  Are you at risk for the Coronavirus (COVID-19)?  To be considered HIGH RISK for Coronavirus (COVID-19), you have to meet the following criteria:  . Traveled to [place], [place], [place], [place] or [place]; or in the [place] to [place], [place], [place], or [place]; and have fever, cough, and shortness of breath within the last 2 weeks of travel OR . Been in close contact with a person diagnosed with COVID-19 within the last 2 weeks and have fever, cough, and shortness of breath . IF YOU DO NOT MEET THESE CRITERIA, YOU ARE CONSIDERED LOW RISK FOR COVID-19.  What to do if you are HIGH RISK for COVID-19?  Marland Kitchen If you are having a medical emergency, call 911. . Seek medical care right away. Before you go to a doctor's office, urgent care or emergency department, call ahead and tell them about your recent travel, contact with someone diagnosed with COVID-19, and your symptoms. You should receive instructions from your physician's office regarding next steps of care.  . When you arrive at healthcare provider, tell the healthcare staff immediately you have returned from visiting [place], [place], [place], [place] or [place]; or traveled in the [place] to [place], [place], [place], or [place]; in the last two weeks or you have been in close contact with a person diagnosed with COVID-19 in the last  2 weeks.   . Tell the health care staff about your symptoms: fever, cough and shortness of breath. . After you have been seen by a medical provider, you will be either: o Tested for (COVID-19) and discharged home on quarantine except to seek medical care if symptoms worsen, and asked to  - Stay home and avoid contact with others  until you get your results (4-5 days)  - Avoid travel on public transportation if possible (such as bus, train, or airplane) or o Sent to the Emergency Department by EMS for evaluation, COVID-19 testing, and possible admission depending on your condition and test results.  What to do if you are LOW RISK for COVID-19?  Reduce your risk of any infection by using the same precautions used for avoiding the common cold or flu:  Marland Kitchen Wash your hands often with soap and warm water for at least 20 seconds.  If soap and water are not readily available, use an alcohol-based hand sanitizer with at least 60% alcohol.  . If coughing or sneezing, cover your mouth and nose by coughing or sneezing into the elbow areas of your shirt or coat, into a tissue or into your sleeve (not your hands). . Avoid shaking hands with others and consider head nods or verbal greetings only. . Avoid touching your eyes, nose, or mouth with unwashed hands.  . Avoid close contact with people who are sick. . Avoid places or events with large numbers of people in one location, like concerts or sporting events. . Carefully consider travel plans you have or are making. . If you are planning any travel outside or inside the [place], visit the CDC's Travelers' Health webpage for the latest health notices. . If you have some symptoms but not all symptoms, continue to monitor at home and seek medical attention if your symptoms worsen. . If you are having a medical emergency, call 911.   [place] / e-Visit: [email or web address]         [facility]: [facility]: [phone]                   [facility]: [phone]

## 2019-05-21 ENCOUNTER — Other Ambulatory Visit: Payer: Self-pay

## 2019-05-23 ENCOUNTER — Other Ambulatory Visit: Payer: Self-pay | Admitting: *Deleted

## 2019-05-23 ENCOUNTER — Ambulatory Visit: Payer: Medicare Other | Admitting: Obstetrics and Gynecology

## 2019-05-23 ENCOUNTER — Telehealth: Payer: Self-pay | Admitting: *Deleted

## 2019-05-23 MED ORDER — MAGIC MOUTHWASH: 5.0000 mL | Freq: Four times a day (QID) | ORAL | 1 refills | Status: AC

## 2019-05-23 NOTE — Telephone Encounter (Signed)
Please call in magic mouth wash with nystatin, thanks   Truitt Merle MD

## 2019-05-23 NOTE — Telephone Encounter (Signed)
Received call from pt stating that she has thrush on her tongue & it is a little sore.  She is asking for script.  Message to Dr Burr Medico.

## 2019-05-28 NOTE — Progress Notes (Signed)
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 Lymph node, biopsy, mediastinal - LYMPH NODE WITH METASTATIC ADENOCARCINOMA. - SEE MICROSCOPIC DESCRIPTION 2. Plaque, coronary artery - CALCIFIED ATHEROSCLEROTIC PLAQUE.   10/14/2018 Miscellaneous   Foundation One:  MSI stable tumor mutation burden 3Muts/mb ERBB2 amplification CCNE1 amplification TP53 mutation(+) CDK6 amplification  HGF amplification    11/13/2018 Cancer Staging   Staging form: Gallbladder, AJCC 8th Edition - Clinical stage from 11/13/2018: Stage IVB (cTX, cN2, pM1) - Signed by Truitt Merle, MD on 12/15/2018   11/17/2018 Imaging   11/17/2018 CT CAP IMPRESSION: 1. 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 This is associated with extensive upper abdominal and retroperitoneal lymphadenopathy, as well as metastatic lymphadenopathy in the posterior mediastinum and left supraclavicular region. There is also a metastatic lesion to the left ischium. 2. 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 3. Aortic atherosclerosis, in addition to left main and 3 vessel coronary artery disease. Status post median sternotomy for CABG including LIMA to the LAD. 4. There are calcifications of the aortic valve. Echocardiographic correlation for evaluation  of potential valvular dysfunction may be warranted if clinically indicated.    11/21/2018 Initial Diagnosis   Gallbladder cancer ([place])   11/27/2018 Pathology Results   11/27/2018 CA19-9 immunohistochemical stain Per request, a CA19-9 immunohistochemical stain was performed at an outside institution revealing positive staining in the tumor cells.    12/15/2018 -  Chemotherapy    first line chemo cisplatin and gemcitabine every 2 weeks on 12/15/18   02/20/2019 PET scan   Restaging PET scan:  IMPRESSION: 1. Positive response to therapy at all primary and metastatic sites. Persistent primary and metastatic lesions do have intense hypermetabolic activity albeit reduced. 2. Decrease in size and hypermetabolic activity of mass lesion in the gallbladder fundus. 3. Interval decrease in size and metabolic activity of periportal adenopathy. 4. Decrease in size and metabolic activity of periaortic lymph nodes. 5. Interval decrease in size and metabolic activity of destructive lesion in the LEFT inferior pubic ramus with new sclerosis. 6. No new or progressive disease.   05/29/2019 Imaging   CT CAP W Contrast  IMPRESSION: Known gallbladder adenocarcinoma, difficult to compare to recent PET, improved from prior CT.  Upper abdominal/retroperitoneal lymphadenopathy, slightly improved from prior PET.  Osseous metastasis involving the left inferior pubic ramus, grossly unchanged.  No evidence of metastatic disease in the chest.  Additional ancillary findings as above.      CURRENT THERAPY:  First linechemo cisplatin and gemcitabineevery 2 weekson 12/15/18. Had 1 month chemo breakin April, restarted on 04/20/19  INTERVAL HISTORY:  Cassie Campbell is here for a follow up of treatment. She presents to the clinic alone. She notes she is doing well. Normal eating, BM, energy. She has no concerns.    REVIEW OF SYSTEMS:   Constitutional: Denies fevers, chills or abnormal weight  loss Eyes:  Denies blurriness of vision Ears, nose, mouth, throat, and face: Denies mucositis or sore throat Respiratory: Denies cough, dyspnea or wheezes Cardiovascular: Denies palpitation, chest discomfort or lower extremity swelling Gastrointestinal:  Denies nausea, heartburn or change in bowel habits Skin: Denies abnormal skin rashes Lymphatics: Denies new lymphadenopathy or easy bruising Neurological:Denies numbness, tingling or new weaknesses Behavioral/Psych: Mood is stable, no new changes  All other systems were reviewed with the patient and are negative.  MEDICAL HISTORY:  Past Medical History:  Diagnosis Date   Allergic rhinitis    Arthritis    Asthma    xolair s 8/05 ?11/07; mastered hfa 12/20/08   Benign positional vertigo    CAD (coronary artery disease)    a. 09/2014 NSTEMI s/p LHC with sig 2V dz. dLAD diffusely diseased and not suitable for PCI. unsuccessful RCA PCI d/t heavy calcifications   GERD (gastroesophageal reflux disease)    Heart attack ([place])    09/2014   Hyperlipidemia    <130 ldl pos fm hx, bp   Hypertension    Osteopenia    dexa 08/22/07 AP spine + 1.1, left femur -1.3, right femur -.8; dexa 10/06/09 +1.6, left femur =1.6, right femur -.   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 breast bx--for microcalcifications in rt. retroareolar region--dx was hyalinized fibroadenoma   BUNIONECTOMY Bilateral    CARDIAC CATHETERIZATION     2015   CATARACT EXTRACTION Right 02/02/2018   Dr Satira Sark   CATARACT EXTRACTION Left 03/30/2018   CERVICAL [place] SURGERY  12/01   CORONARY ARTERY BYPASS GRAFT N/A 10/04/2018   Procedure: CORONARY ARTERY BYPASS GRAFTING (CABG) times 2 using left  Internal mammary artery to LAD, left greater saphenous vein - open  harvest.;  Surgeon: [name], MD;  Location: [facility];  Service: Open Heart Surgery;  Laterality: N/A;   IR IMAGING GUIDED PORT INSERTION  12/08/2018   LEFT HEART CATH AND CORONARY ANGIOGRAPHY N/A 09/27/2018   Procedure: LEFT HEART CATH AND CORONARY ANGIOGRAPHY;  Surgeon: Troy Sine, MD;  Location: [facility];  Service: Cardiovascular;  Laterality: N/A;   LEFT HEART CATHETERIZATION WITH CORONARY ANGIOGRAM N/A 10/16/2014   Procedure: LEFT HEART CATHETERIZATION WITH CORONARY ANGIOGRAM;  Surgeon: Blane Ohara, MD;  Location: [facility] CATH LAB;  Service: Cardiovascular;  Laterality: N/A;   Left L4-5 transforaminal lumbar interbody fusion with Depuy cage, rods and screws, local and allograft bone graft, Vivigen; bilateral decompression/partial hemilaminectomy lumbar five-sacral one  07/2017   PERCUTANEOUS CORONARY ROTOBLATOR INTERVENTION (PCI-R) N/A 10/17/2014   Procedure: PERCUTANEOUS CORONARY ROTOBLATOR INTERVENTION (PCI-R);  Surgeon: Troy Sine, MD;  Location: [facility] CATH LAB;  Service: Cardiovascular;  Laterality: N/A;   TEE WITHOUT CARDIOVERSION N/A 10/04/2018   Procedure: TRANSESOPHAGEAL ECHOCARDIOGRAM (TEE);  Surgeon: [name], MD;  Location: [facility];  Service: Open Heart Surgery;  Laterality: N/A;   VEIN LIGATION AND STRIPPING Left    VIDEO BRONCHOSCOPY Bilateral 02/05/2015   Procedure: VIDEO BRONCHOSCOPY WITHOUT FLUORO;  Surgeon: Tanda Rockers, MD;  Location: [facility];  Service: Endoscopy;  Laterality: Bilateral;   VULVA /PERINEUM BIOPSY  12-30-10   --epidermoid cyst    I have reviewed the social history and family history with the patient and they are unchanged from previous note.  ALLERGIES:  is allergic to fish-derived products; penicillins; aspirin; brilinta [ticagrelor]; codeine phosphate; and diphenhydramine hcl.  MEDICATIONS:  Current Outpatient Medications  Medication Sig Dispense Refill   albuterol (PROAIR HFA) 108 (90 Base) MCG/ACT  inhaler INHALE 1 TO 2 PUFFS BY MOUTH EVERY 4 HOURS AS NEEDED FOR WHEEZING 8.5 g 3   atorvastatin (LIPITOR) 80 MG tablet TAKE 1 TABLET(80 MG) BY MOUTH DAILY 90 tablet 1   bisoprolol (ZEBETA) 10 MG tablet Take 1 tablet (10 mg total) by mouth daily. 90 tablet 1   celecoxib (CELEBREX) 200 MG capsule Take 200 mg by mouth 2 (two) times daily as needed.     cetirizine (ZYRTEC) 10 MG tablet Take 10 mg by mouth at bedtime as needed for allergies.     cholecalciferol 2000 units TABS Take 1 tablet (2,000 Units total) by mouth daily. 30 tablet 3   clopidogrel (PLAVIX) 75 MG tablet Take 1 tablet (75 mg total) by mouth daily. 90 tablet 3   dextromethorphan-guaiFENesin (MUCINEX DM) 30-600 MG per 12 hr tablet Take 1-2 tablets by mouth every 12 (twelve) hours as needed for cough (with flutter).      famotidine (PEPCID) 20 MG tablet Take 20 mg by mouth as needed.      ferrous sulfate 325 (65 FE) MG tablet Take 650 mg by mouth daily with breakfast.      fluticasone (FLONASE) 50 MCG/ACT nasal spray Place 1-2 sprays into both nostrils 2 (two) times daily as needed for allergies or rhinitis.     lidocaine-prilocaine (EMLA) cream Apply to affected area once 30 g 3   magic mouthwash SOLN Take 5 mLs by mouth 4 (four) times daily. Leave Benadryl out of compound since pt allergic to it. Use hydrocortisone & nystatin. Swish & swallow or spit for thrush 240 mL 1   magnesium oxide (MAG-OX) 400 (241.3 Mg) MG tablet Take 1 tablet (400 mg total) by mouth daily. (Patient taking differently: Take 400 mg by mouth 2 (two) times daily. ) 90 tablet 1   meclizine (ANTIVERT) 25 MG tablet TAKE 2 TABLETS BY MOUTH THREE TIMES DAILY AS NEEDED 24 tablet 2   montelukast (SINGULAIR) 10 MG tablet TAKE 1 TABLET(10 MG) BY MOUTH AT BEDTIME 90 tablet 1   Multiple Vitamin (MULTIVITAMIN) capsule Take 1 capsule by mouth daily.      omeprazole (PRILOSEC) 20 MG capsule Take 20 mg by mouth as needed.      ondansetron (ZOFRAN) 8 MG tablet  Take 1 tablet (8 mg total) by mouth 2 (two) times daily as needed. Start on the third day after chemotherapy. 30 tablet 1   potassium chloride SA (K-DUR) 20 MEQ tablet TAKE 1 TABLET(20 MEQ) BY MOUTH TWICE DAILY (Patient taking differently: Take 20 mEq by mouth daily. ) 60 tablet 2   prochlorperazine (COMPAZINE) 10 MG tablet Take 1 tablet (10 mg total) by mouth every 6 (six) hours as needed (Nausea or vomiting). 30 tablet 1   SYMBICORT 160-4.5 MCG/ACT inhaler INHALE 2 PUFFS BY MOUTH EVERY 12 HOURS 10.2 g 11   furosemide (LASIX) 20 MG tablet Take 2 tablets (40 mg total) by mouth daily. 180 tablet 3   HYDROcodone-acetaminophen (NORCO/VICODIN) 5-325 MG tablet Take 1 tablet by mouth every 6 (six) hours as needed for moderate pain. (Patient not taking: Reported on 05/31/2019) 30 tablet 0   No current facility-administered medications for this visit.     PHYSICAL EXAMINATION: ECOG PERFORMANCE STATUS: 1 - Symptomatic but completely ambulatory  Vitals:   05/31/19 1124  BP: (!) 149/85  Pulse: 82  Resp: 18  Temp: 98.5 F (36.9 C)  SpO2: 100%   Filed Weights   05/31/19 1124  Weight: 125 lb 9.6 oz (57 kg)    GENERAL:alert, no distress and comfortable SKIN: skin color, texture, turgor are normal, no rashes or significant lesions EYES: normal, Conjunctiva are pink and non-injected, sclera clear  NECK: supple, thyroid normal size, non-tender, without nodularity LYMPH:  no palpable lymphadenopathy in the cervical, axillary  LUNGS: clear to auscultation and percussion with normal breathing effort HEART: regular rate & rhythm and no murmurs and no lower extremity edema ABDOMEN:abdomen soft, non-tender and normal bowel sounds Musculoskeletal:no cyanosis of digits and no clubbing  NEURO: alert & oriented x 3 with fluent speech, no focal motor/sensory deficits  LABORATORY DATA:  I have reviewed the data as listed CBC Latest Ref Rng & Units 05/31/2019 05/17/2019 05/04/2019  WBC 4.0 - 10.5 K/uL 6.4  6.7 5.1  Hemoglobin 12.0 - 15.0 g/dL 10.5(L) 12.1 11.6(L)  Hematocrit 36.0 - 46.0 % 32.8(L) 39.0 36.4  Platelets 150 - 400 K/uL 207 263 301     CMP Latest Ref Rng & Units 05/31/2019 05/17/2019 05/04/2019  Glucose 70 - 99 mg/dL 89 87 97  BUN 8 - 23 mg/dL 8 11 15   Creatinine 0.44 - 1.00 mg/dL 0.82 0.98 0.84  Sodium 135 - 145 mmol/L 141 140 139  Potassium 3.5 - 5.1 mmol/L 3.8 4.0 4.0  Chloride 98 - 111 mmol/L 106 103 102  CO2 22 - 32 mmol/L 25 27 27   Calcium 8.9 - 10.3 mg/dL 8.2(L) 9.0 9.7  Total Protein 6.5 - 8.1 g/dL 8.2(H) 8.7(H) 8.7(H)  Total Bilirubin 0.3 - 1.2 mg/dL 0.3 0.3 0.3  Alkaline Phos 38 - 126 U/L 83 84 90  AST 15 - 41 U/L 28 27 23   ALT 0 - 44 U/L 20 20 18       RADIOGRAPHIC STUDIES: I have personally reviewed the radiological images as listed and agreed with the findings in the report. No results found.   ASSESSMENT & PLAN:  Cassie Campbell is a 78 y.o. 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 Tolerating well. -[employer] testing results showed MSI stable disease, low tumor mutation burden,notargetable mutation such asFGFR fusion or IDH mutation, but (+) HER2 amplification, may try Herceptin in the future. -Her CT CAP from 05/29/19 shows known LN are improving, bone metastasis stable, no new mets or growth. Her known gallbladder mass improved compared to last CT. I reviewed the scan images with pt in person.  -She is overall responding well to current regimen. I discussed we plan to continue until no longer tolerable or she has disease progression. She is fine to take chemo break as needed.  -I discussed the longer she is on chemo and the more lines of treatment given the her disease response lessens.    -Labs reviewed, CBC WNL except HG 10.5, CMP WNL except Calcium 8.2. Mag 1.4. Overall adequate to proceed with Gem/Cis tomorrowand continue every 2 weeks -Continue oral magnesium and potassium.  -f/u in 4 weeks  2. CAD, S/P CABGX2 on 09/27/2018, EF 40-45% -Currently on Plavix. She also takes lasix during chemo. -f/u with Dr. Claiborne Billings -stable,she appears to be euvolemic  3. Asthma -Currently on Symbicort, Singulair, and albuterol  as needed -f/u with pulmonary, stable, no recent flare  4.Goal of care discussion -The patient understands the goal of care is palliative. -she is full code for now  5.Hypokalemiaand hypomagnesemia  -Likely secondary to lasix. Currently on KCL 40mq and mag-ox 4035monce daily -due to persistent hypomagnesemia, will increase oral mag twice daily   6. Mild Anemia -Secondary to chemoand her recent open heart surgery -Hg at 10.5 today (05/31/19)   Plan -scan reviewed, she is responding to chemo well -Labs reviewed and adequate to proceed with gem/Cis tomorrow and continue every 2 weeks -will give iv Mag 2 g and KCL 2055mtomorrow, and increase oral Mag to twice daily  -continue zometa every 12 weeks  -f/u in 4 weeks     No problem-specific Assessment & Plan notes found for this encounter.   No orders of the defined types were placed in this encounter.  All questions were answered. The patient knows to call the clinic with any problems, questions or concerns. No barriers to learning was detected. I spent 20 minutes counseling the patient face to face. The total time spent in the appointment was 25 minutes and more than 50% was on counseling and review of test results     YanTruitt MerleD 05/31/2019   I, AmoJoslyn Devonm acting as scribe for YanTruitt MerleD.   I have reviewed the above documentation for accuracy and completeness, and I agree with the above.

## 2019-05-29 ENCOUNTER — Other Ambulatory Visit: Payer: Self-pay

## 2019-05-29 ENCOUNTER — Encounter (HOSPITAL_COMMUNITY): Payer: Self-pay

## 2019-05-29 ENCOUNTER — Ambulatory Visit (HOSPITAL_COMMUNITY)
Admission: RE | Admit: 2019-05-29 | Discharge: 2019-05-29 | Disposition: A | Discharge status: Home / Self Care | Payer: Medicare Other | Source: Ambulatory Visit | Attending: Hematology | Admitting: Hematology

## 2019-05-29 DIAGNOSIS — C23 Malignant neoplasm of gallbladder: Secondary | ICD-10-CM | POA: Diagnosis not present

## 2019-05-29 MED ORDER — IOHEXOL 300 MG/ML  SOLN
100.0000 mL | Freq: Once | INTRAMUSCULAR | Status: AC | PRN
  Administered 2019-05-29: 100 mL via INTRAVENOUS

## 2019-05-29 MED ORDER — SODIUM CHLORIDE (PF) 0.9 % IJ SOLN
INTRAMUSCULAR | Status: AC
  Filled 2019-05-29: qty 50

## 2019-05-30 ENCOUNTER — Other Ambulatory Visit: Payer: Self-pay | Admitting: Hematology

## 2019-05-31 ENCOUNTER — Inpatient Hospital Stay: Payer: Medicare Other | Attending: Obstetrics

## 2019-05-31 ENCOUNTER — Inpatient Hospital Stay: Payer: Medicare Other

## 2019-05-31 ENCOUNTER — Inpatient Hospital Stay (HOSPITAL_BASED_OUTPATIENT_CLINIC_OR_DEPARTMENT_OTHER): Payer: Medicare Other | Admitting: Hematology

## 2019-05-31 ENCOUNTER — Encounter: Payer: Self-pay | Admitting: Hematology

## 2019-05-31 ENCOUNTER — Other Ambulatory Visit: Payer: Self-pay

## 2019-05-31 ENCOUNTER — Telehealth: Payer: Self-pay | Admitting: *Deleted

## 2019-05-31 VITALS — BP 149/85 | HR 82 | Temp 98.5°F | Resp 18 | Ht 59.0 in | Wt 125.6 lb

## 2019-05-31 DIAGNOSIS — J45909 Unspecified asthma, uncomplicated: Secondary | ICD-10-CM | POA: Insufficient documentation

## 2019-05-31 DIAGNOSIS — C23 Malignant neoplasm of gallbladder: Secondary | ICD-10-CM | POA: Insufficient documentation

## 2019-05-31 DIAGNOSIS — E876 Hypokalemia: Secondary | ICD-10-CM | POA: Diagnosis not present

## 2019-05-31 DIAGNOSIS — Z452 Encounter for adjustment and management of vascular access device: Secondary | ICD-10-CM | POA: Insufficient documentation

## 2019-05-31 DIAGNOSIS — I2510 Atherosclerotic heart disease of native coronary artery without angina pectoris: Secondary | ICD-10-CM | POA: Insufficient documentation

## 2019-05-31 DIAGNOSIS — Z5111 Encounter for antineoplastic chemotherapy: Secondary | ICD-10-CM | POA: Diagnosis not present

## 2019-05-31 DIAGNOSIS — C7951 Secondary malignant neoplasm of bone: Secondary | ICD-10-CM

## 2019-05-31 DIAGNOSIS — D6481 Anemia due to antineoplastic chemotherapy: Secondary | ICD-10-CM | POA: Insufficient documentation

## 2019-05-31 LAB — CBC WITH DIFFERENTIAL (CANCER CENTER ONLY)
Abs Immature Granulocytes: 0.04 10*3/uL (ref 0.00–0.07)
Basophils Absolute: 0 10*3/uL (ref 0.0–0.1)
Basophils Relative: 1 %
Eosinophils Absolute: 0.2 10*3/uL (ref 0.0–0.5)
Eosinophils Relative: 2 %
HCT: 32.8 % — ABNORMAL LOW (ref 36.0–46.0)
Hemoglobin: 10.5 g/dL — ABNORMAL LOW (ref 12.0–15.0)
Immature Granulocytes: 1 %
Lymphocytes Relative: 20 %
Lymphs Abs: 1.3 10*3/uL (ref 0.7–4.0)
MCH: 32.4 pg (ref 26.0–34.0)
MCHC: 32 g/dL (ref 30.0–36.0)
MCV: 101.2 fL — ABNORMAL HIGH (ref 80.0–100.0)
Monocytes Absolute: 0.7 10*3/uL (ref 0.1–1.0)
Monocytes Relative: 11 %
Neutro Abs: 4.2 10*3/uL (ref 1.7–7.7)
Neutrophils Relative %: 65 %
Platelet Count: 207 10*3/uL (ref 150–400)
RBC: 3.24 MIL/uL — ABNORMAL LOW (ref 3.87–5.11)
RDW: 16.4 % — ABNORMAL HIGH (ref 11.5–15.5)
WBC Count: 6.4 10*3/uL (ref 4.0–10.5)
nRBC: 0 % (ref 0.0–0.2)

## 2019-05-31 LAB — CMP (CANCER CENTER ONLY)
ALT: 20 U/L (ref 0–44)
AST: 28 U/L (ref 15–41)
Albumin: 3.4 g/dL — ABNORMAL LOW (ref 3.5–5.0)
Alkaline Phosphatase: 83 U/L (ref 38–126)
Anion gap: 10 (ref 5–15)
BUN: 8 mg/dL (ref 8–23)
CO2: 25 mmol/L (ref 22–32)
Calcium: 8.2 mg/dL — ABNORMAL LOW (ref 8.9–10.3)
Chloride: 106 mmol/L (ref 98–111)
Creatinine: 0.82 mg/dL (ref 0.44–1.00)
GFR, Est AFR Am: >60 mL/min (ref >60)
GFR, Estimated: >60 mL/min (ref >60)
Glucose, Bld: 89 mg/dL (ref 70–99)
Potassium: 3.8 mmol/L (ref 3.5–5.1)
Sodium: 141 mmol/L (ref 135–145)
Total Bilirubin: 0.3 mg/dL (ref 0.3–1.2)
Total Protein: 8.2 g/dL — ABNORMAL HIGH (ref 6.5–8.1)

## 2019-05-31 LAB — MAGNESIUM: Magnesium: 1.4 mg/dL — CL (ref 1.7–2.4)

## 2019-05-31 MED ORDER — SODIUM CHLORIDE 0.9% FLUSH
10.0000 mL | Freq: Once | INTRAVENOUS | Status: AC | PRN
  Administered 2019-05-31: 10 mL
  Filled 2019-05-31: qty 10

## 2019-05-31 MED ORDER — HEPARIN SOD (PORK) LOCK FLUSH 100 UNIT/ML IV SOLN
500.0000 [IU] | Freq: Once | INTRAVENOUS | Status: AC | PRN
  Administered 2019-05-31: 500 [IU]
  Filled 2019-05-31: qty 5

## 2019-05-31 NOTE — Telephone Encounter (Signed)
Spoke with pt and instructed pt to increase Mag Oxide 400 mg Twice daily as per Dr. Ernestina Penna instructions.  Pt voiced understanding.

## 2019-06-01 ENCOUNTER — Inpatient Hospital Stay: Payer: Medicare Other

## 2019-06-01 ENCOUNTER — Telehealth: Payer: Self-pay | Admitting: Hematology

## 2019-06-01 ENCOUNTER — Other Ambulatory Visit: Payer: Self-pay

## 2019-06-01 VITALS — BP 122/70 | HR 71 | Temp 98.9°F | Resp 16

## 2019-06-01 DIAGNOSIS — Z7189 Other specified counseling: Secondary | ICD-10-CM

## 2019-06-01 DIAGNOSIS — C23 Malignant neoplasm of gallbladder: Secondary | ICD-10-CM

## 2019-06-01 DIAGNOSIS — Z5111 Encounter for antineoplastic chemotherapy: Secondary | ICD-10-CM | POA: Diagnosis not present

## 2019-06-01 MED ORDER — SODIUM CHLORIDE 0.9 % IV SOLN
25.0000 mg/m2 | Freq: Once | INTRAVENOUS | Status: AC
  Administered 2019-06-01: 38 mg via INTRAVENOUS
  Filled 2019-06-01: qty 38

## 2019-06-01 MED ORDER — FUROSEMIDE 10 MG/ML IJ SOLN
INTRAMUSCULAR | Status: AC
  Filled 2019-06-01: qty 2

## 2019-06-01 MED ORDER — SODIUM CHLORIDE 0.9% FLUSH
10.0000 mL | INTRAVENOUS | Status: DC | PRN
  Administered 2019-06-01: 16:00:00 10 mL
  Filled 2019-06-01: qty 10

## 2019-06-01 MED ORDER — SODIUM CHLORIDE 0.9 % IV SOLN
Freq: Once | INTRAVENOUS | Status: AC
  Administered 2019-06-01: 11:00:00 via INTRAVENOUS
  Filled 2019-06-01: qty 250

## 2019-06-01 MED ORDER — SODIUM CHLORIDE 0.9 % IV SOLN
Freq: Once | INTRAVENOUS | Status: AC
  Administered 2019-06-01: 08:00:00 via INTRAVENOUS
  Filled 2019-06-01: qty 250

## 2019-06-01 MED ORDER — FUROSEMIDE 10 MG/ML IJ SOLN
20.0000 mg | Freq: Once | INTRAMUSCULAR | Status: AC
  Administered 2019-06-01: 20 mg via INTRAVENOUS

## 2019-06-01 MED ORDER — SODIUM CHLORIDE 0.9 % IV SOLN
1000.0000 mg/m2 | Freq: Once | INTRAVENOUS | Status: AC
  Administered 2019-06-01: 1520 mg via INTRAVENOUS
  Filled 2019-06-01: qty 39.98

## 2019-06-01 MED ORDER — SODIUM CHLORIDE 0.9 % IV SOLN
Freq: Once | INTRAVENOUS | Status: AC
  Administered 2019-06-01: 11:00:00 via INTRAVENOUS
  Filled 2019-06-01: qty 5

## 2019-06-01 MED ORDER — PALONOSETRON HCL INJECTION 0.25 MG/5ML
INTRAVENOUS | Status: AC
  Filled 2019-06-01: qty 5

## 2019-06-01 MED ORDER — MAGNESIUM SULFATE 2 GM/50ML IV SOLN
2.0000 g | Freq: Once | INTRAVENOUS | Status: AC
  Administered 2019-06-01: 2 g via INTRAVENOUS
  Filled 2019-06-01: qty 50

## 2019-06-01 MED ORDER — HEPARIN SOD (PORK) LOCK FLUSH 100 UNIT/ML IV SOLN
500.0000 [IU] | Freq: Once | INTRAVENOUS | Status: AC | PRN
  Administered 2019-06-01: 16:00:00 500 [IU]
  Filled 2019-06-01: qty 5

## 2019-06-01 MED ORDER — PALONOSETRON HCL INJECTION 0.25 MG/5ML
0.2500 mg | Freq: Once | INTRAVENOUS | Status: AC
  Administered 2019-06-01: 0.25 mg via INTRAVENOUS

## 2019-06-01 MED ORDER — POTASSIUM CHLORIDE 2 MEQ/ML IV SOLN
Freq: Once | INTRAVENOUS | Status: AC
  Administered 2019-06-01: 09:00:00 via INTRAVENOUS
  Filled 2019-06-01: qty 10

## 2019-06-01 NOTE — Patient Instructions (Signed)
[facility] Discharge Instructions for Patients Receiving Chemotherapy  Today you received the following chemotherapy agents Gemzar, Cisplatin  To help prevent nausea and vomiting after your treatment, we encourage you to take your nausea medication as directed by your MD.   If you develop nausea and vomiting that is not controlled by your nausea medication, call the clinic.   BELOW ARE SYMPTOMS THAT SHOULD BE REPORTED IMMEDIATELY:  *FEVER GREATER THAN 100.5 F  *CHILLS WITH OR WITHOUT FEVER  NAUSEA AND VOMITING THAT IS NOT CONTROLLED WITH YOUR NAUSEA MEDICATION  *UNUSUAL SHORTNESS OF BREATH  *UNUSUAL BRUISING OR BLEEDING  TENDERNESS IN MOUTH AND THROAT WITH OR WITHOUT PRESENCE OF ULCERS  *URINARY PROBLEMS  *BOWEL PROBLEMS  UNUSUAL RASH Items with * indicate a potential emergency and should be followed up as soon as possible.  Feel free to call the clinic should you have any questions or concerns. The clinic phone number is [phone].  Please show the [place] at check-in to the Emergency Department and triage nurse.  Coronavirus (COVID-19) Are you at risk?  Are you at risk for the Coronavirus (COVID-19)?  To be considered HIGH RISK for Coronavirus (COVID-19), you have to meet the following criteria:  . Traveled to [place], [place], [place], [place] or [place]; or in the [place] to [place], [place], [place], or [place]; and have fever, cough, and shortness of breath within the last 2 weeks of travel OR . Been in close contact with a person diagnosed with COVID-19 within the last 2 weeks and have fever, cough, and shortness of breath . IF YOU DO NOT MEET THESE CRITERIA, YOU ARE CONSIDERED LOW RISK FOR COVID-19.  What to do if you are HIGH RISK for COVID-19?  Marland Kitchen If you are having a medical emergency, call 911. . Seek medical care right away. Before you go to a doctor's office, urgent care or emergency department, call ahead and tell  them about your recent travel, contact with someone diagnosed with COVID-19, and your symptoms. You should receive instructions from your physician's office regarding next steps of care.  . When you arrive at healthcare provider, tell the healthcare staff immediately you have returned from visiting [place], [place], [place], [place] or [place]; or traveled in the [place] to [place], [place], [place], or [place]; in the last two weeks or you have been in close contact with a person diagnosed with COVID-19 in the last 2 weeks.   . Tell the health care staff about your symptoms: fever, cough and shortness of breath. . After you have been seen by a medical provider, you will be either: o Tested for (COVID-19) and discharged home on quarantine except to seek medical care if symptoms worsen, and asked to  - Stay home and avoid contact with others until you get your results (4-5 days)  - Avoid travel on public transportation if possible (such as bus, train, or airplane) or o Sent to the Emergency Department by EMS for evaluation, COVID-19 testing, and possible admission depending on your condition and test results.  What to do if you are LOW RISK for COVID-19?  Reduce your risk of any infection by using the same precautions used for avoiding the common cold or flu:  Marland Kitchen Wash your hands often with soap and warm water for at least 20 seconds.  If soap and water are not readily available, use an alcohol-based hand sanitizer with at least 60% alcohol.  Marland Kitchen  If coughing or sneezing, cover your mouth and nose by coughing or sneezing into the elbow areas of your shirt or coat, into a tissue or into your sleeve (not your hands). . Avoid shaking hands with others and consider head nods or verbal greetings only. . Avoid touching your eyes, nose, or mouth with unwashed hands.  . Avoid close contact with people who are sick. . Avoid places or events with large numbers of people in one location, like concerts or  sporting events. . Carefully consider travel plans you have or are making. . If you are planning any travel outside or inside the [place], visit the CDC's Travelers' Health webpage for the latest health notices. . If you have some symptoms but not all symptoms, continue to monitor at home and seek medical attention if your symptoms worsen. . If you are having a medical emergency, call 911.   [place] / e-Visit: [email or web address]         [facility]: [facility]: [phone]                   [facility]: [phone]

## 2019-06-01 NOTE — Progress Notes (Signed)
Per Dr. Burr Medico, give an additional magnesium 2 grams IV today for level of 1.4 (total = 3.5 grams). Orders entered.   Demetrius Charity, PharmD, [facility] Pharmac[facility]acy Phone: [phone] 06/01/2019

## 2019-06-01 NOTE — Telephone Encounter (Signed)
Appts already scheduled per 6/11 sch message.

## 2019-06-15 ENCOUNTER — Inpatient Hospital Stay: Payer: Medicare Other

## 2019-06-15 ENCOUNTER — Other Ambulatory Visit: Payer: Self-pay

## 2019-06-15 ENCOUNTER — Telehealth: Payer: Self-pay | Admitting: *Deleted

## 2019-06-15 VITALS — BP 147/72 | HR 63 | Temp 97.8°F | Resp 18

## 2019-06-15 DIAGNOSIS — C23 Malignant neoplasm of gallbladder: Secondary | ICD-10-CM

## 2019-06-15 DIAGNOSIS — Z5111 Encounter for antineoplastic chemotherapy: Secondary | ICD-10-CM | POA: Diagnosis not present

## 2019-06-15 DIAGNOSIS — Z7189 Other specified counseling: Secondary | ICD-10-CM

## 2019-06-15 LAB — CBC WITH DIFFERENTIAL (CANCER CENTER ONLY)
Abs Immature Granulocytes: 0.03 10*3/uL (ref 0.00–0.07)
Basophils Absolute: 0 10*3/uL (ref 0.0–0.1)
Basophils Relative: 1 %
Eosinophils Absolute: 0.3 10*3/uL (ref 0.0–0.5)
Eosinophils Relative: 4 %
HCT: 33.8 % — ABNORMAL LOW (ref 36.0–46.0)
Hemoglobin: 11 g/dL — ABNORMAL LOW (ref 12.0–15.0)
Immature Granulocytes: 1 %
Lymphocytes Relative: 18 %
Lymphs Abs: 1.1 10*3/uL (ref 0.7–4.0)
MCH: 32.8 pg (ref 26.0–34.0)
MCHC: 32.5 g/dL (ref 30.0–36.0)
MCV: 100.9 fL — ABNORMAL HIGH (ref 80.0–100.0)
Monocytes Absolute: 0.7 10*3/uL (ref 0.1–1.0)
Monocytes Relative: 13 %
Neutro Abs: 3.8 10*3/uL (ref 1.7–7.7)
Neutrophils Relative %: 63 %
Platelet Count: 222 10*3/uL (ref 150–400)
RBC: 3.35 MIL/uL — ABNORMAL LOW (ref 3.87–5.11)
RDW: 17.2 % — ABNORMAL HIGH (ref 11.5–15.5)
WBC Count: 5.9 10*3/uL (ref 4.0–10.5)
nRBC: 0.3 % — ABNORMAL HIGH (ref 0.0–0.2)

## 2019-06-15 LAB — CMP (CANCER CENTER ONLY)
ALT: 15 U/L (ref 0–44)
AST: 24 U/L (ref 15–41)
Albumin: 3.5 g/dL (ref 3.5–5.0)
Alkaline Phosphatase: 82 U/L (ref 38–126)
Anion gap: 12 (ref 5–15)
BUN: 12 mg/dL (ref 8–23)
CO2: 24 mmol/L (ref 22–32)
Calcium: 9.3 mg/dL (ref 8.9–10.3)
Chloride: 104 mmol/L (ref 98–111)
Creatinine: 0.96 mg/dL (ref 0.44–1.00)
GFR, Est AFR Am: >60 mL/min (ref >60)
GFR, Estimated: 57 mL/min — ABNORMAL LOW (ref >60)
Glucose, Bld: 79 mg/dL (ref 70–99)
Potassium: 4.8 mmol/L (ref 3.5–5.1)
Sodium: 140 mmol/L (ref 135–145)
Total Bilirubin: 0.3 mg/dL (ref 0.3–1.2)
Total Protein: 8.5 g/dL — ABNORMAL HIGH (ref 6.5–8.1)

## 2019-06-15 LAB — MAGNESIUM: Magnesium: 1.4 mg/dL — CL (ref 1.7–2.4)

## 2019-06-15 MED ORDER — SODIUM CHLORIDE 0.9% FLUSH
10.0000 mL | Freq: Once | INTRAVENOUS | Status: AC | PRN
  Administered 2019-06-15: 10 mL
  Filled 2019-06-15: qty 10

## 2019-06-15 MED ORDER — HEPARIN SOD (PORK) LOCK FLUSH 100 UNIT/ML IV SOLN
500.0000 [IU] | Freq: Once | INTRAVENOUS | Status: AC | PRN
  Administered 2019-06-15: 500 [IU]
  Filled 2019-06-15: qty 5

## 2019-06-15 MED ORDER — FUROSEMIDE 10 MG/ML IJ SOLN
INTRAMUSCULAR | Status: AC
  Filled 2019-06-15: qty 2

## 2019-06-15 MED ORDER — PALONOSETRON HCL INJECTION 0.25 MG/5ML
INTRAVENOUS | Status: AC
  Filled 2019-06-15: qty 5

## 2019-06-15 MED ORDER — POTASSIUM CHLORIDE 2 MEQ/ML IV SOLN
Freq: Once | INTRAVENOUS | Status: AC
  Administered 2019-06-15: 10:00:00 via INTRAVENOUS
  Filled 2019-06-15: qty 10

## 2019-06-15 MED ORDER — PALONOSETRON HCL INJECTION 0.25 MG/5ML
0.2500 mg | Freq: Once | INTRAVENOUS | Status: AC
  Administered 2019-06-15: 0.25 mg via INTRAVENOUS

## 2019-06-15 MED ORDER — FUROSEMIDE 10 MG/ML IJ SOLN
20.0000 mg | Freq: Once | INTRAMUSCULAR | Status: AC
  Administered 2019-06-15: 20 mg via INTRAVENOUS

## 2019-06-15 MED ORDER — SODIUM CHLORIDE 0.9 % IV SOLN
Freq: Once | INTRAVENOUS | Status: AC
  Administered 2019-06-15: 09:00:00 via INTRAVENOUS
  Filled 2019-06-15: qty 250

## 2019-06-15 MED ORDER — SODIUM CHLORIDE 0.9% FLUSH
10.0000 mL | INTRAVENOUS | Status: DC | PRN
  Administered 2019-06-15: 10 mL
  Filled 2019-06-15: qty 10

## 2019-06-15 MED ORDER — SODIUM CHLORIDE 0.9 % IV SOLN
Freq: Once | INTRAVENOUS | Status: AC
  Administered 2019-06-15: 12:00:00 via INTRAVENOUS
  Filled 2019-06-15: qty 5

## 2019-06-15 MED ORDER — SODIUM CHLORIDE 0.9 % IV SOLN
1000.0000 mg/m2 | Freq: Once | INTRAVENOUS | Status: AC
  Administered 2019-06-15: 1520 mg via INTRAVENOUS
  Filled 2019-06-15: qty 39.98

## 2019-06-15 MED ORDER — SODIUM CHLORIDE 0.9 % IV SOLN
25.0000 mg/m2 | Freq: Once | INTRAVENOUS | Status: AC
  Administered 2019-06-15: 38 mg via INTRAVENOUS
  Filled 2019-06-15: qty 38

## 2019-06-15 NOTE — Progress Notes (Signed)
06/15/19  Per Dr. Burr Medico, give an additional magnesium 16 mEq IV today for level of 1.4 (total = 28 mEq). Orders entered.   T.O. Dr Lavonda Jumbo, PharmD

## 2019-06-15 NOTE — Patient Instructions (Signed)
[place] Discharge Instructions for Patients Receiving Chemotherapy  Today you received the following chemotherapy agents Gemzar, Cisplatin  To help prevent nausea and vomiting after your treatment, we encourage you to take your nausea medication as directed by your MD.   If you develop nausea and vomiting that is not controlled by your nausea medication, call the clinic.   BELOW ARE SYMPTOMS THAT SHOULD BE REPORTED IMMEDIATELY:  *FEVER GREATER THAN 100.5 F  *CHILLS WITH OR WITHOUT FEVER  NAUSEA AND VOMITING THAT IS NOT CONTROLLED WITH YOUR NAUSEA MEDICATION  *UNUSUAL SHORTNESS OF BREATH  *UNUSUAL BRUISING OR BLEEDING  TENDERNESS IN MOUTH AND THROAT WITH OR WITHOUT PRESENCE OF ULCERS  *URINARY PROBLEMS  *BOWEL PROBLEMS  UNUSUAL RASH Items with * indicate a potential emergency and should be followed up as soon as possible.  Feel free to call the clinic should you have any questions or concerns. The clinic phone number is [phone].  Please show the [facility] at check-in to the Emergency Department and triage nurse.  Coronavirus (COVID-19) Are you at risk?  Are you at risk for the Coronavirus (COVID-19)?  To be considered HIGH RISK for Coronavirus (COVID-19), you have to meet the following criteria:  . Traveled to [place], [place], [place], [place] or [place]; or in the [place] to [place], [place], [place], or [place]; and have fever, cough, and shortness of breath within the last 2 weeks of travel OR . Been in close contact with a person diagnosed with COVID-19 within the last 2 weeks and have fever, cough, and shortness of breath . IF YOU DO NOT MEET THESE CRITERIA, YOU ARE CONSIDERED LOW RISK FOR COVID-19.  What to do if you are HIGH RISK for COVID-19?  Marland Kitchen If you are having a medical emergency, call 911. . Seek medical care right away. Before you go to a doctor's office, urgent care or emergency department, call ahead and tell  them about your recent travel, contact with someone diagnosed with COVID-19, and your symptoms. You should receive instructions from your physician's office regarding next steps of care.  . When you arrive at healthcare provider, tell the healthcare staff immediately you have returned from visiting [place], [place], [place], [place] or [place]; or traveled in the [place] to [place], [place], [place], or [place]; in the last two weeks or you have been in close contact with a person diagnosed with COVID-19 in the last 2 weeks.   . Tell the health care staff about your symptoms: fever, cough and shortness of breath. . After you have been seen by a medical provider, you will be either: o Tested for (COVID-19) and discharged home on quarantine except to seek medical care if symptoms worsen, and asked to  - Stay home and avoid contact with others until you get your results (4-5 days)  - Avoid travel on public transportation if possible (such as bus, train, or airplane) or o Sent to the Emergency Department by EMS for evaluation, COVID-19 testing, and possible admission depending on your condition and test results.  What to do if you are LOW RISK for COVID-19?  Reduce your risk of any infection by using the same precautions used for avoiding the common cold or flu:  Marland Kitchen Wash your hands often with soap and warm water for at least 20 seconds.  If soap and water are not readily available, use an alcohol-based hand sanitizer with at least 60% alcohol.  Marland Kitchen  If coughing or sneezing, cover your mouth and nose by coughing or sneezing into the elbow areas of your shirt or coat, into a tissue or into your sleeve (not your hands). . Avoid shaking hands with others and consider head nods or verbal greetings only. . Avoid touching your eyes, nose, or mouth with unwashed hands.  . Avoid close contact with people who are sick. . Avoid places or events with large numbers of people in one location, like concerts or  sporting events. . Carefully consider travel plans you have or are making. . If you are planning any travel outside or inside the [place], visit the CDC's Travelers' Health webpage for the latest health notices. . If you have some symptoms but not all symptoms, continue to monitor at home and seek medical attention if your symptoms worsen. . If you are having a medical emergency, call 911.   [place] / e-Visit: [email or web address]         [facility]: [facility]: [phone]                   [facility]: [phone]

## 2019-06-15 NOTE — Telephone Encounter (Signed)
Received call from [facility] lab with critical lab results from this morning. Magnesium 1.4 Dr. Burr Medico made aware

## 2019-06-16 LAB — CANCER ANTIGEN 19-9: CA 19-9: 484 U/mL — ABNORMAL HIGH (ref 0–35)

## 2019-06-21 NOTE — Progress Notes (Signed)
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 Lymph node, biopsy, mediastinal - LYMPH NODE WITH METASTATIC ADENOCARCINOMA. - SEE MICROSCOPIC DESCRIPTION 2. Plaque, coronary artery - CALCIFIED ATHEROSCLEROTIC PLAQUE.   10/14/2018 Miscellaneous   Foundation One:  MSI stable tumor mutation burden 3Muts/mb ERBB2 amplification CCNE1 amplification TP53 mutation(+) CDK6 amplification  HGF amplification    11/13/2018 Cancer Staging   Staging form: Gallbladder, AJCC 8th Edition - Clinical stage from 11/13/2018: Stage IVB (cTX, cN2, pM1) - Signed by Truitt Merle, MD on 12/15/2018   11/17/2018 Imaging   11/17/2018 CT CAP IMPRESSION: 1. 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 This is associated with extensive upper abdominal and retroperitoneal lymphadenopathy, as well as metastatic lymphadenopathy in the posterior mediastinum and left supraclavicular region. There is also a metastatic lesion to the left ischium. 2. 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 3. Aortic atherosclerosis, in addition to left main and 3 vessel coronary artery disease. Status post median sternotomy for CABG including LIMA to the LAD. 4. There are calcifications of the aortic valve. Echocardiographic correlation for evaluation  of potential valvular dysfunction may be warranted if clinically indicated.    11/21/2018 Initial Diagnosis   Gallbladder cancer ([place])   11/27/2018 Pathology Results   11/27/2018 CA19-9 immunohistochemical stain Per request, a CA19-9 immunohistochemical stain was performed at an outside institution revealing positive staining in the tumor cells.    12/15/2018 -  Chemotherapy    first line chemo cisplatin and gemcitabine every 2 weeks on 12/15/18   02/20/2019 PET scan   Restaging PET scan:  IMPRESSION: 1. Positive response to therapy at all primary and metastatic sites. Persistent primary and metastatic lesions do have intense hypermetabolic activity albeit reduced. 2. Decrease in size and hypermetabolic activity of mass lesion in the gallbladder fundus. 3. Interval decrease in size and metabolic activity of periportal adenopathy. 4. Decrease in size and metabolic activity of periaortic lymph nodes. 5. Interval decrease in size and metabolic activity of destructive lesion in the LEFT inferior pubic ramus with new sclerosis. 6. No new or progressive disease.   05/29/2019 Imaging   CT CAP W Contrast  IMPRESSION: Known gallbladder adenocarcinoma, difficult to compare to recent PET, improved from prior CT.  Upper abdominal/retroperitoneal lymphadenopathy, slightly improved from prior PET.  Osseous metastasis involving the left inferior pubic ramus, grossly unchanged.  No evidence of metastatic disease in the chest.  Additional ancillary findings as above.      CURRENT THERAPY:  -First linechemo cisplatin and gemcitabineevery 2 weekson 12/15/18. Had 1 month chemo breakin April, restarted on 04/20/19 -Zometa q12weeks starting 01/26/19   INTERVAL HISTORY:  Cassie Campbell is here for a follow up and treatment. She presents to the clinic alone. She notes after last chemo cycle she had nausea for 3 days and then dissapated. She used antimetics which helped.   For 3 days  she  had hip pain, she used Celebrex 1-2 times a day as needed and Tylenol and the pain eased up. The pain is more when she moves. She has had this pain before and is not worse then usual. She has a cane at home and she has normal ROM. She denies injury as the cause and this is manageable. She takes oral mag BID, no diarrhea from this.     REVIEW OF SYSTEMS:   Constitutional: Denies fevers, chills or abnormal weight loss Eyes: Denies blurriness of vision Ears, nose, mouth, throat, and face: Denies mucositis or sore throat Respiratory: Denies cough, dyspnea or wheezes Cardiovascular: Denies palpitation, chest discomfort or lower extremity swelling Gastrointestinal:  Denies nausea, heartburn or change in bowel habits Skin: Denies abnormal skin rashes MSK: (+) hip pain  Lymphatics: Denies new lymphadenopathy or easy bruising Neurological:Denies numbness, tingling or new weaknesses Behavioral/Psych: Mood is stable, no new changes  All other systems were reviewed with the patient and are negative.  MEDICAL HISTORY:  Past Medical History:  Diagnosis Date   Allergic rhinitis    Arthritis    Asthma    xolair s 8/05 ?11/07; mastered hfa 12/20/08   Benign positional vertigo    CAD (coronary artery disease)    a. 09/2014 NSTEMI s/p LHC with sig 2V dz. dLAD diffusely diseased and not suitable for PCI. unsuccessful RCA PCI d/t heavy calcifications   GERD (gastroesophageal reflux disease)    Heart attack ([place])    09/2014   Hyperlipidemia    <130 ldl pos fm hx, bp   Hypertension    Osteopenia    dexa 08/22/07 AP spine + 1.1, left femur -1.3, right femur -.8; dexa 10/06/09 +1.6, left femur =1.6, right femur -.   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 breast bx--for  microcalcifications in rt. retroareolar region--dx was hyalinized fibroadenoma   BUNIONECTOMY Bilateral    CARDIAC CATHETERIZATION     2015   CATARACT EXTRACTION Right 02/02/2018   Dr Satira Sark   CATARACT EXTRACTION Left 03/30/2018   CERVICAL [place] SURGERY  12/01   CORONARY ARTERY BYPASS GRAFT N/A 10/04/2018   Procedure: CORONARY ARTERY BYPASS GRAFTING (CABG) times 2 using left  Internal mammary artery to LAD, left greater saphenous vein - open harvest.;  Surgeon: [name], MD;  Location: [facility];  Service: Open Heart Surgery;  Laterality: N/A;   IR IMAGING GUIDED PORT INSERTION  12/08/2018   LEFT HEART CATH AND CORONARY ANGIOGRAPHY N/A 09/27/2018   Procedure: LEFT HEART CATH AND CORONARY ANGIOGRAPHY;  Surgeon: Troy Sine, MD;  Location: [facility];  Service: Cardiovascular;  Laterality: N/A;   LEFT HEART CATHETERIZATION WITH CORONARY ANGIOGRAM N/A 10/16/2014   Procedure: LEFT HEART CATHETERIZATION WITH CORONARY ANGIOGRAM;  Surgeon: Blane Ohara, MD;  Location: [facility] CATH LAB;  Service: Cardiovascular;  Laterality: N/A;   Left L4-5 transforaminal lumbar interbody fusion with Depuy cage, rods and screws, local and allograft bone graft, Vivigen; bilateral decompression/partial hemilaminectomy lumbar five-sacral one  07/2017   PERCUTANEOUS CORONARY ROTOBLATOR INTERVENTION (PCI-R) N/A 10/17/2014   Procedure: PERCUTANEOUS CORONARY ROTOBLATOR INTERVENTION (PCI-R);  Surgeon: Troy Sine, MD;  Location: [facility] CATH LAB;  Service: Cardiovascular;  Laterality: N/A;   TEE WITHOUT CARDIOVERSION N/A 10/04/2018   Procedure: TRANSESOPHAGEAL ECHOCARDIOGRAM (TEE);  Surgeon: [name], MD;  Location: [facility];  Service: Open Heart Surgery;  Laterality: N/A;   VEIN LIGATION AND STRIPPING Left    VIDEO BRONCHOSCOPY Bilateral 02/05/2015   Procedure: VIDEO BRONCHOSCOPY WITHOUT FLUORO;  Surgeon: Tanda Rockers, MD;  Location: [facility];  Service: Endoscopy;  Laterality:  Bilateral;   VULVA /PERINEUM BIOPSY  12-30-10   --epidermoid cyst    I have reviewed the social history and family history with the patient and they are unchanged from previous note.  ALLERGIES:  is allergic to fish-derived products; penicillins; aspirin; brilinta [ticagrelor]; codeine phosphate; and diphenhydramine hcl.  MEDICATIONS:  Current Outpatient Medications  Medication Sig Dispense Refill   albuterol (PROAIR HFA) 108 (90 Base) MCG/ACT inhaler INHALE 1 TO 2 PUFFS BY MOUTH EVERY 4 HOURS AS NEEDED FOR WHEEZING 8.5 g 3   atorvastatin (LIPITOR) 80 MG tablet TAKE 1 TABLET(80 MG) BY MOUTH DAILY 90 tablet 1   bisoprolol (ZEBETA) 10 MG tablet Take 1 tablet (10 mg total) by mouth daily. 90 tablet 1   celecoxib (CELEBREX) 200 MG capsule Take 200 mg by mouth 2 (two) times daily as needed.     cetirizine (ZYRTEC) 10 MG tablet Take 10 mg by mouth at bedtime as needed for allergies.     cholecalciferol 2000 units TABS Take 1 tablet (2,000 Units total) by mouth daily. 30 tablet 3   clopidogrel (PLAVIX) 75 MG tablet Take 1 tablet (75 mg total) by mouth daily. 90 tablet 3   dextromethorphan-guaiFENesin (MUCINEX DM) 30-600 MG per 12 hr tablet Take 1-2 tablets by mouth every 12 (twelve) hours as needed for cough (with flutter).      famotidine (PEPCID) 20 MG tablet Take 20 mg by mouth as needed.      ferrous sulfate 325 (65 FE) MG tablet Take 650 mg by mouth daily with breakfast.      fluticasone (FLONASE) 50 MCG/ACT nasal spray Place 1-2 sprays into both nostrils 2 (two) times daily as needed for allergies or rhinitis.     HYDROcodone-acetaminophen (NORCO/VICODIN) 5-325 MG tablet Take 1 tablet by mouth every 6 (six) hours as needed for moderate pain. 30 tablet 0   lidocaine-prilocaine (EMLA) cream Apply to affected area once 30 g 3   magic mouthwash SOLN Take 5 mLs by mouth 4 (four) times daily. Leave Benadryl out of compound since pt allergic to it. Use hydrocortisone & nystatin. Swish  & swallow or spit for thrush 240 mL 1   magnesium oxide (MAG-OX) 400 (241.3 Mg) MG tablet Take 1 tablet (400 mg total) by mouth daily. (Patient taking differently: Take 400 mg by mouth 2 (two) times daily. ) 90 tablet 1   meclizine (ANTIVERT) 25 MG tablet TAKE 2 TABLETS BY MOUTH THREE TIMES DAILY AS NEEDED 24 tablet 2   montelukast (SINGULAIR) 10 MG tablet TAKE 1 TABLET(10 MG) BY MOUTH AT BEDTIME 90 tablet 1   Multiple Vitamin (MULTIVITAMIN) capsule Take 1 capsule by mouth daily.      omeprazole (PRILOSEC) 20 MG capsule Take 20 mg by mouth as needed.      ondansetron (ZOFRAN) 8 MG tablet Take 1 tablet (8 mg total) by mouth 2 (two) times daily as needed. Start on the third day after chemotherapy. 30 tablet 1   potassium chloride SA (K-DUR) 20 MEQ tablet TAKE 1 TABLET(20 MEQ) BY MOUTH TWICE DAILY (Patient taking differently: Take 20 mEq by mouth daily. ) 60 tablet 2   prochlorperazine (COMPAZINE) 10 MG tablet Take 1 tablet (  10 mg total) by mouth every 6 (six) hours as needed (Nausea or vomiting). 30 tablet 1   SYMBICORT 160-4.5 MCG/ACT inhaler INHALE 2 PUFFS BY MOUTH EVERY 12 HOURS 10.2 g 11   furosemide (LASIX) 20 MG tablet Take 2 tablets (40 mg total) by mouth daily. 180 tablet 3   No current facility-administered medications for this visit.    Facility-Administered Medications Ordered in Other Visits  Medication Dose Route Frequency Provider Last Rate Last Dose   CISplatin (PLATINOL) 38 mg in sodium chloride 0.9 % 250 mL chemo infusion  25 mg/m2 (Treatment Plan Recorded) Intravenous Once Truitt Merle, MD       fosaprepitant (EMEND) 150 mg, dexamethasone (DECADRON) 12 mg in sodium chloride 0.9 % 145 mL IVPB   Intravenous Once Truitt Merle, MD       furosemide (LASIX) injection 20 mg  20 mg Intravenous Once Truitt Merle, MD       gemcitabine (GEMZAR) 1,520 mg in sodium chloride 0.9 % 250 mL chemo infusion  1,000 mg/m2 (Treatment Plan Recorded) Intravenous Once Truitt Merle, MD       heparin  lock flush 100 unit/mL  500 Units Intracatheter Once PRN Truitt Merle, MD       palonosetron (ALOXI) injection 0.25 mg  0.25 mg Intravenous Once Truitt Merle, MD       sodium chloride flush (NS) 0.9 % injection 10 mL  10 mL Intracatheter PRN Truitt Merle, MD        PHYSICAL EXAMINATION: ECOG PERFORMANCE STATUS: 1 - Symptomatic but completely ambulatory  Vitals:   06/29/19 0807  BP: 137/71  Pulse: 75  Resp: 18  Temp: 97.7 F (36.5 C)  SpO2: 99%   Filed Weights   06/29/19 0807  Weight: 124 lb 9.6 oz (56.5 kg)    GENERAL:alert, no distress and comfortable SKIN: skin color, texture, turgor are normal, no rashes or significant lesions EYES: normal, Conjunctiva are pink and non-injected, sclera clear  NECK: supple, thyroid normal size, non-tender, without nodularity LYMPH:  no palpable lymphadenopathy in the cervical, axillary  LUNGS: clear to auscultation and percussion with normal breathing effort HEART: regular rate & rhythm and no murmurs and no lower extremity edema ABDOMEN:abdomen soft, non-tender and normal bowel sounds Musculoskeletal:no cyanosis of digits and no clubbing  NEURO: alert & oriented x 3 with fluent speech, no focal motor/sensory deficits  LABORATORY DATA:  I have reviewed the data as listed CBC Latest Ref Rng & Units 06/29/2019 06/15/2019 05/31/2019  WBC 4.0 - 10.5 K/uL 5.6 5.9 6.4  Hemoglobin 12.0 - 15.0 g/dL 10.7(L) 11.0(L) 10.5(L)  Hematocrit 36.0 - 46.0 % 32.8(L) 33.8(L) 32.8(L)  Platelets 150 - 400 K/uL 202 222 207     CMP Latest Ref Rng & Units 06/29/2019 06/15/2019 05/31/2019  Glucose 70 - 99 mg/dL 90 79 89  BUN 8 - 23 mg/dL 14 12 8   Creatinine 0.44 - 1.00 mg/dL 0.83 0.96 0.82  Sodium 135 - 145 mmol/L 139 140 141  Potassium 3.5 - 5.1 mmol/L 4.6 4.8 3.8  Chloride 98 - 111 mmol/L 106 104 106  CO2 22 - 32 mmol/L 22 24 25   Calcium 8.9 - 10.3 mg/dL 8.7(L) 9.3 8.2(L)  Total Protein 6.5 - 8.1 g/dL 8.5(H) 8.5(H) 8.2(H)  Total Bilirubin 0.3 - 1.2 mg/dL 0.3 0.3  0.3  Alkaline Phos 38 - 126 U/L 91 82 83  AST 15 - 41 U/L 28 24 28   ALT 0 - 44 U/L 17 15 20  RADIOGRAPHIC STUDIES: I have personally reviewed the radiological images as listed and agreed with the findings in the report. No results found.   ASSESSMENT & PLAN:  IMAAN PADGETT is a 78 y.o. 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 Tolerating well. -[employer] testing results showed MSI stable disease, low tumor mutation burden,notargetable mutation such asFGFR fusion or IDH mutation, but (+) HER2 amplification, may try Herceptin in the future. -Her CT CAP from 05/29/19 shows known LN are improving, bone metastasis stable, no new mets or growth. Her known gallbladder mass improved compared to last CT. I reviewed the scan images with pt in person.  -She is overall responding well to current regimen. I discussed we plan to continue until no longer tolerable or she has disease progression. She is fine to take chemo break as needed.  -I discussed the longer she is on chemo and the more lines of treatment given the her disease response lessens.  -She continues to tolerate treatment. She did experience 3 days of nause after chemo then dissapated. Labs reviewed, CBC and CMP WNL except Hg 10.7, calcium 8.7. Mag 1.5. Overall adequate to proceed with Gem/Cis todayand continue every 2 weeks -Continue oral magnesium and potassium. -Will repeat scan in 08/2019. Her Ca 19.9 is fluctuating up and down, if it continues to increase may scan her sooner.  -f/u in 4weeks  2. CAD, S/P CABGX2 on 09/27/2018, EF 40-45% -Currently on Plavix. She also takes lasix during chemo. -f/u with Dr. Claiborne Billings -stable,she appears to be euvolemic  3.  Asthma -Currently on Symbicort, Singulair, and albuterol as needed -f/u with pulmonary, stable, no recent flare  4.Goal of care discussion -The patient understands the goal of care is palliative. -she is full code for now  5.Hypokalemiaand hypomagnesemia -Likely secondary to lasix. Currently on KCL 62mqand mag-ox 4066mnce daily -Due to persistent hypomagnesemia, will increase oral mag twice daily  -Mag at 1.5 today (06/29/19). Will increase oral mag to TID and IV mag in hydration to 3g today    6. Mild Anemia -Secondary to chemoand her recent open heart surgery -Hg at 10.7 today (06/29/19), continue oral iron   7. Recurrnet hip pain  -likely musckuloskelatal at this point. Tolerable, She is still functional -She uses Celebrex and Tylenol.  -If worsens I will do more workup to rule out bone mets.    Plan -Labs reviewed and adequate to proceed with gem/Cis today andcontinueevery 2 weeks -continue zometa every 12 weeks, next on 07/27/19  -lab, flush, gem/Cis in 2 and 4 weeks  -f/u in 4 weeks    No problem-specific Assessment & Plan notes found for this encounter.   No orders of the defined types were placed in this encounter.  All questions were answered. The patient knows to call the clinic with any problems, questions or concerns. No barriers to learning was detected. I spent 20 minutes counseling the patient face to face. The total time spent in the appointment was 25 minutes and more than 50% was on counseling and review of test results     YaTruitt MerleMD 06/29/2019   I, AmJoslyn Devonam acting as scribe for YaTruitt MerleMD.   I have reviewed the above documentation for accuracy and completeness, and I agree with the above.

## 2019-06-25 ENCOUNTER — Encounter: Payer: Self-pay | Admitting: Obstetrics and Gynecology

## 2019-06-29 ENCOUNTER — Encounter: Payer: Self-pay | Admitting: Hematology

## 2019-06-29 ENCOUNTER — Inpatient Hospital Stay: Payer: Medicare Other

## 2019-06-29 ENCOUNTER — Inpatient Hospital Stay (HOSPITAL_BASED_OUTPATIENT_CLINIC_OR_DEPARTMENT_OTHER): Payer: Medicare Other | Admitting: Hematology

## 2019-06-29 ENCOUNTER — Other Ambulatory Visit: Payer: Self-pay

## 2019-06-29 ENCOUNTER — Inpatient Hospital Stay: Payer: Medicare Other | Attending: Obstetrics

## 2019-06-29 ENCOUNTER — Telehealth: Payer: Self-pay | Admitting: Hematology

## 2019-06-29 VITALS — BP 137/71 | HR 75 | Temp 97.7°F | Resp 18 | Ht 59.0 in | Wt 124.6 lb

## 2019-06-29 DIAGNOSIS — E876 Hypokalemia: Secondary | ICD-10-CM | POA: Diagnosis not present

## 2019-06-29 DIAGNOSIS — I10 Essential (primary) hypertension: Secondary | ICD-10-CM

## 2019-06-29 DIAGNOSIS — C7951 Secondary malignant neoplasm of bone: Secondary | ICD-10-CM | POA: Insufficient documentation

## 2019-06-29 DIAGNOSIS — M25559 Pain in unspecified hip: Secondary | ICD-10-CM | POA: Diagnosis not present

## 2019-06-29 DIAGNOSIS — J45909 Unspecified asthma, uncomplicated: Secondary | ICD-10-CM | POA: Insufficient documentation

## 2019-06-29 DIAGNOSIS — D6481 Anemia due to antineoplastic chemotherapy: Secondary | ICD-10-CM | POA: Insufficient documentation

## 2019-06-29 DIAGNOSIS — C23 Malignant neoplasm of gallbladder: Secondary | ICD-10-CM

## 2019-06-29 DIAGNOSIS — I2510 Atherosclerotic heart disease of native coronary artery without angina pectoris: Secondary | ICD-10-CM | POA: Insufficient documentation

## 2019-06-29 DIAGNOSIS — Z5111 Encounter for antineoplastic chemotherapy: Secondary | ICD-10-CM | POA: Insufficient documentation

## 2019-06-29 DIAGNOSIS — C771 Secondary and unspecified malignant neoplasm of intrathoracic lymph nodes: Secondary | ICD-10-CM | POA: Diagnosis not present

## 2019-06-29 DIAGNOSIS — Z7189 Other specified counseling: Secondary | ICD-10-CM

## 2019-06-29 LAB — CBC WITH DIFFERENTIAL (CANCER CENTER ONLY)
Abs Immature Granulocytes: 0.03 10*3/uL (ref 0.00–0.07)
Basophils Absolute: 0 10*3/uL (ref 0.0–0.1)
Basophils Relative: 1 %
Eosinophils Absolute: 0.2 10*3/uL (ref 0.0–0.5)
Eosinophils Relative: 3 %
HCT: 32.8 % — ABNORMAL LOW (ref 36.0–46.0)
Hemoglobin: 10.7 g/dL — ABNORMAL LOW (ref 12.0–15.0)
Immature Granulocytes: 1 %
Lymphocytes Relative: 17 %
Lymphs Abs: 1 10*3/uL (ref 0.7–4.0)
MCH: 32.4 pg (ref 26.0–34.0)
MCHC: 32.6 g/dL (ref 30.0–36.0)
MCV: 99.4 fL (ref 80.0–100.0)
Monocytes Absolute: 0.6 10*3/uL (ref 0.1–1.0)
Monocytes Relative: 10 %
Neutro Abs: 3.9 10*3/uL (ref 1.7–7.7)
Neutrophils Relative %: 68 %
Platelet Count: 202 10*3/uL (ref 150–400)
RBC: 3.3 MIL/uL — ABNORMAL LOW (ref 3.87–5.11)
RDW: 17.7 % — ABNORMAL HIGH (ref 11.5–15.5)
WBC Count: 5.6 10*3/uL (ref 4.0–10.5)
nRBC: 0 % (ref 0.0–0.2)

## 2019-06-29 LAB — CMP (CANCER CENTER ONLY)
ALT: 17 U/L (ref 0–44)
AST: 28 U/L (ref 15–41)
Albumin: 3.4 g/dL — ABNORMAL LOW (ref 3.5–5.0)
Alkaline Phosphatase: 91 U/L (ref 38–126)
Anion gap: 11 (ref 5–15)
BUN: 14 mg/dL (ref 8–23)
CO2: 22 mmol/L (ref 22–32)
Calcium: 8.7 mg/dL — ABNORMAL LOW (ref 8.9–10.3)
Chloride: 106 mmol/L (ref 98–111)
Creatinine: 0.83 mg/dL (ref 0.44–1.00)
GFR, Est AFR Am: >60 mL/min (ref >60)
GFR, Estimated: >60 mL/min (ref >60)
Glucose, Bld: 90 mg/dL (ref 70–99)
Potassium: 4.6 mmol/L (ref 3.5–5.1)
Sodium: 139 mmol/L (ref 135–145)
Total Bilirubin: 0.3 mg/dL (ref 0.3–1.2)
Total Protein: 8.5 g/dL — ABNORMAL HIGH (ref 6.5–8.1)

## 2019-06-29 LAB — MAGNESIUM: Magnesium: 1.5 mg/dL — ABNORMAL LOW (ref 1.7–2.4)

## 2019-06-29 MED ORDER — HEPARIN SOD (PORK) LOCK FLUSH 100 UNIT/ML IV SOLN
500.0000 [IU] | Freq: Once | INTRAVENOUS | Status: AC | PRN
  Administered 2019-06-29: 500 [IU]
  Filled 2019-06-29: qty 5

## 2019-06-29 MED ORDER — SODIUM CHLORIDE 0.9 % IV SOLN
Freq: Once | INTRAVENOUS | Status: AC
  Administered 2019-06-29: 12:00:00 via INTRAVENOUS
  Filled 2019-06-29: qty 5

## 2019-06-29 MED ORDER — FUROSEMIDE 10 MG/ML IJ SOLN
INTRAMUSCULAR | Status: AC
  Filled 2019-06-29: qty 2

## 2019-06-29 MED ORDER — PALONOSETRON HCL INJECTION 0.25 MG/5ML
INTRAVENOUS | Status: AC
  Filled 2019-06-29: qty 5

## 2019-06-29 MED ORDER — POTASSIUM CHLORIDE 2 MEQ/ML IV SOLN
Freq: Once | INTRAVENOUS | Status: AC
  Administered 2019-06-29: 10:00:00 via INTRAVENOUS
  Filled 2019-06-29: qty 1000

## 2019-06-29 MED ORDER — SODIUM CHLORIDE 0.9 % IV SOLN
25.0000 mg/m2 | Freq: Once | INTRAVENOUS | Status: AC
  Administered 2019-06-29: 38 mg via INTRAVENOUS
  Filled 2019-06-29: qty 38

## 2019-06-29 MED ORDER — SODIUM CHLORIDE 0.9% FLUSH
10.0000 mL | INTRAVENOUS | Status: DC | PRN
  Administered 2019-06-29: 10 mL
  Filled 2019-06-29: qty 10

## 2019-06-29 MED ORDER — SODIUM CHLORIDE 0.9 % IV SOLN
1000.0000 mg/m2 | Freq: Once | INTRAVENOUS | Status: AC
  Administered 2019-06-29: 1520 mg via INTRAVENOUS
  Filled 2019-06-29: qty 39.98

## 2019-06-29 MED ORDER — FUROSEMIDE 10 MG/ML IJ SOLN
20.0000 mg | Freq: Once | INTRAMUSCULAR | Status: AC
  Administered 2019-06-29: 20 mg via INTRAVENOUS

## 2019-06-29 MED ORDER — SODIUM CHLORIDE 0.9 % IV SOLN
Freq: Once | INTRAVENOUS | Status: AC
  Administered 2019-06-29: 10:00:00 via INTRAVENOUS
  Filled 2019-06-29: qty 250

## 2019-06-29 MED ORDER — SODIUM CHLORIDE 0.9% FLUSH
10.0000 mL | Freq: Once | INTRAVENOUS | Status: AC | PRN
  Administered 2019-06-29: 10 mL
  Filled 2019-06-29: qty 10

## 2019-06-29 MED ORDER — PALONOSETRON HCL INJECTION 0.25 MG/5ML
0.2500 mg | Freq: Once | INTRAVENOUS | Status: AC
  Administered 2019-06-29: 12:00:00 0.25 mg via INTRAVENOUS

## 2019-06-29 NOTE — Telephone Encounter (Signed)
Scheduled appt per 7/10 los.

## 2019-06-29 NOTE — Patient Instructions (Signed)
[facility] Discharge Instructions for Patients Receiving Chemotherapy  Today you received the following chemotherapy agents Gemcitabine (GEMZAR) & Cisplatin (PLATINOL).  To help prevent nausea and vomiting after your treatment, we encourage you to take your nausea medication as prescribed.   If you develop nausea and vomiting that is not controlled by your nausea medication, call the clinic.   BELOW ARE SYMPTOMS THAT SHOULD BE REPORTED IMMEDIATELY:  *FEVER GREATER THAN 100.5 F  *CHILLS WITH OR WITHOUT FEVER  NAUSEA AND VOMITING THAT IS NOT CONTROLLED WITH YOUR NAUSEA MEDICATION  *UNUSUAL SHORTNESS OF BREATH  *UNUSUAL BRUISING OR BLEEDING  TENDERNESS IN MOUTH AND THROAT WITH OR WITHOUT PRESENCE OF ULCERS  *URINARY PROBLEMS  *BOWEL PROBLEMS  UNUSUAL RASH Items with * indicate a potential emergency and should be followed up as soon as possible.  Feel free to call the clinic should you have any questions or concerns. The clinic phone number is [phone].  Please show the CHEMO ALERT CARD at check-in to the Emergency Department and triage nurse.  Coronavirus (COVID-19) Are you at risk?  Are you at risk for the Coronavirus (COVID-19)?  To be considered HIGH RISK for Coronavirus (COVID-19), you have to meet the following criteria:  . Traveled to [place], [place], [place], [place] or [place]; or in the [place] to [place], [place], [place], or [place]; and have fever, cough, and shortness of breath within the last 2 weeks of travel OR . Been in close contact with a person diagnosed with COVID-19 within the last 2 weeks and have fever, cough, and shortness of breath . IF YOU DO NOT MEET THESE CRITERIA, YOU ARE CONSIDERED LOW RISK FOR COVID-19.  What to do if you are HIGH RISK for COVID-19?  . If you are having a medical emergency, call 911. . Seek medical care right away. Before you go to a doctor's office, urgent care or emergency department, call  ahead and tell them about your recent travel, contact with someone diagnosed with COVID-19, and your symptoms. You should receive instructions from your physician's office regarding next steps of care.  . When you arrive at healthcare provider, tell the healthcare staff immediately you have returned from visiting [place], [place], [place], [place] or [place]; or traveled in the [place] to [place], [place], [place], or [place]; in the last two weeks or you have been in close contact with a person diagnosed with COVID-19 in the last 2 weeks.   . Tell the health care staff about your symptoms: fever, cough and shortness of breath. . After you have been seen by a medical provider, you will be either: o Tested for (COVID-19) and discharged home on quarantine except to seek medical care if symptoms worsen, and asked to  - Stay home and avoid contact with others until you get your results (4-5 days)  - Avoid travel on public transportation if possible (such as bus, train, or airplane) or o Sent to the Emergency Department by EMS for evaluation, COVID-19 testing, and possible admission depending on your condition and test results.  What to do if you are LOW RISK for COVID-19?  Reduce your risk of any infection by using the same precautions used for avoiding the common cold or flu:  . Wash your hands often with soap and warm water for at least 20 seconds.  If soap and water are not readily available, use an alcohol-based hand sanitizer with at least 60% alcohol.  .   If coughing or sneezing, cover your mouth and nose by coughing or sneezing into the elbow areas of your shirt or coat, into a tissue or into your sleeve (not your hands). . Avoid shaking hands with others and consider head nods or verbal greetings only. . Avoid touching your eyes, nose, or mouth with unwashed hands.  . Avoid close contact with people who are sick. . Avoid places or events with large numbers of people in one location,  like concerts or sporting events. . Carefully consider travel plans you have or are making. . If you are planning any travel outside or inside the [place], visit the CDC's Travelers' Health webpage for the latest health notices. . If you have some symptoms but not all symptoms, continue to monitor at home and seek medical attention if your symptoms worsen. . If you are having a medical emergency, call 911.   ADDITIONAL HEALTHCARE OPTIONS FOR PATIENTS  [facility] / e-Visit: [email or web address]         [facility]: [phone]  [facility]: [phone]                   [facility]: [phone]    

## 2019-07-04 ENCOUNTER — Ambulatory Visit: Payer: Medicare Other | Admitting: Obstetrics and Gynecology

## 2019-07-13 ENCOUNTER — Inpatient Hospital Stay: Payer: Medicare Other

## 2019-07-13 ENCOUNTER — Other Ambulatory Visit: Payer: Self-pay

## 2019-07-13 ENCOUNTER — Other Ambulatory Visit: Payer: Self-pay | Admitting: Hematology

## 2019-07-13 VITALS — BP 132/78 | HR 69 | Temp 98.7°F | Resp 16

## 2019-07-13 DIAGNOSIS — Z5111 Encounter for antineoplastic chemotherapy: Secondary | ICD-10-CM | POA: Diagnosis not present

## 2019-07-13 DIAGNOSIS — C23 Malignant neoplasm of gallbladder: Secondary | ICD-10-CM

## 2019-07-13 DIAGNOSIS — Z7189 Other specified counseling: Secondary | ICD-10-CM

## 2019-07-13 LAB — CMP (CANCER CENTER ONLY)
ALT: 15 U/L (ref 0–44)
AST: 24 U/L (ref 15–41)
Albumin: 3.5 g/dL (ref 3.5–5.0)
Alkaline Phosphatase: 85 U/L (ref 38–126)
Anion gap: 11 (ref 5–15)
BUN: 16 mg/dL (ref 8–23)
CO2: 23 mmol/L (ref 22–32)
Calcium: 9.9 mg/dL (ref 8.9–10.3)
Chloride: 106 mmol/L (ref 98–111)
Creatinine: 0.98 mg/dL (ref 0.44–1.00)
GFR, Est AFR Am: >60 mL/min (ref >60)
GFR, Estimated: 56 mL/min — ABNORMAL LOW (ref >60)
Glucose, Bld: 103 mg/dL — ABNORMAL HIGH (ref 70–99)
Potassium: 4.7 mmol/L (ref 3.5–5.1)
Sodium: 140 mmol/L (ref 135–145)
Total Bilirubin: 0.3 mg/dL (ref 0.3–1.2)
Total Protein: 8.3 g/dL — ABNORMAL HIGH (ref 6.5–8.1)

## 2019-07-13 LAB — CBC WITH DIFFERENTIAL (CANCER CENTER ONLY)
Abs Immature Granulocytes: 0.02 10*3/uL (ref 0.00–0.07)
Basophils Absolute: 0 10*3/uL (ref 0.0–0.1)
Basophils Relative: 1 %
Eosinophils Absolute: 0.3 10*3/uL (ref 0.0–0.5)
Eosinophils Relative: 5 %
HCT: 32.9 % — ABNORMAL LOW (ref 36.0–46.0)
Hemoglobin: 10.5 g/dL — ABNORMAL LOW (ref 12.0–15.0)
Immature Granulocytes: 0 %
Lymphocytes Relative: 16 %
Lymphs Abs: 0.9 10*3/uL (ref 0.7–4.0)
MCH: 32.3 pg (ref 26.0–34.0)
MCHC: 31.9 g/dL (ref 30.0–36.0)
MCV: 101.2 fL — ABNORMAL HIGH (ref 80.0–100.0)
Monocytes Absolute: 0.6 10*3/uL (ref 0.1–1.0)
Monocytes Relative: 10 %
Neutro Abs: 4.1 10*3/uL (ref 1.7–7.7)
Neutrophils Relative %: 68 %
Platelet Count: 199 10*3/uL (ref 150–400)
RBC: 3.25 MIL/uL — ABNORMAL LOW (ref 3.87–5.11)
RDW: 18.2 % — ABNORMAL HIGH (ref 11.5–15.5)
WBC Count: 5.9 10*3/uL (ref 4.0–10.5)
nRBC: 0 % (ref 0.0–0.2)

## 2019-07-13 LAB — MAGNESIUM: Magnesium: 1.6 mg/dL — ABNORMAL LOW (ref 1.7–2.4)

## 2019-07-13 MED ORDER — SODIUM CHLORIDE 0.9 % IV SOLN
25.0000 mg/m2 | Freq: Once | INTRAVENOUS | Status: AC
  Administered 2019-07-13: 38 mg via INTRAVENOUS
  Filled 2019-07-13: qty 38

## 2019-07-13 MED ORDER — SODIUM CHLORIDE 0.9 % IV SOLN
Freq: Once | INTRAVENOUS | Status: AC
  Administered 2019-07-13: 12:00:00 via INTRAVENOUS
  Filled 2019-07-13: qty 5

## 2019-07-13 MED ORDER — SODIUM CHLORIDE 0.9 % IV SOLN
1000.0000 mg/m2 | Freq: Once | INTRAVENOUS | Status: AC
  Administered 2019-07-13: 1520 mg via INTRAVENOUS
  Filled 2019-07-13: qty 39.98

## 2019-07-13 MED ORDER — SODIUM CHLORIDE 0.9% FLUSH
10.0000 mL | Freq: Once | INTRAVENOUS | Status: AC | PRN
  Administered 2019-07-13: 08:00:00 10 mL
  Filled 2019-07-13: qty 10

## 2019-07-13 MED ORDER — SODIUM CHLORIDE 0.9% FLUSH
10.0000 mL | INTRAVENOUS | Status: DC | PRN
  Administered 2019-07-13: 10 mL
  Filled 2019-07-13: qty 10

## 2019-07-13 MED ORDER — PALONOSETRON HCL INJECTION 0.25 MG/5ML
INTRAVENOUS | Status: AC
  Filled 2019-07-13: qty 5

## 2019-07-13 MED ORDER — FUROSEMIDE 10 MG/ML IJ SOLN
20.0000 mg | Freq: Once | INTRAMUSCULAR | Status: AC
  Administered 2019-07-13: 20 mg via INTRAVENOUS

## 2019-07-13 MED ORDER — SODIUM CHLORIDE 0.9 % IV SOLN
Freq: Once | INTRAVENOUS | Status: AC
  Administered 2019-07-13: 10:00:00 via INTRAVENOUS
  Filled 2019-07-13: qty 250

## 2019-07-13 MED ORDER — POTASSIUM CHLORIDE 2 MEQ/ML IV SOLN
Freq: Once | INTRAVENOUS | Status: AC
  Administered 2019-07-13: 10:00:00 via INTRAVENOUS
  Filled 2019-07-13: qty 1000

## 2019-07-13 MED ORDER — FUROSEMIDE 10 MG/ML IJ SOLN
INTRAMUSCULAR | Status: AC
  Filled 2019-07-13: qty 2

## 2019-07-13 MED ORDER — HEPARIN SOD (PORK) LOCK FLUSH 100 UNIT/ML IV SOLN
500.0000 [IU] | Freq: Once | INTRAVENOUS | Status: AC | PRN
  Administered 2019-07-13: 500 [IU]
  Filled 2019-07-13: qty 5

## 2019-07-13 MED ORDER — PALONOSETRON HCL INJECTION 0.25 MG/5ML
0.2500 mg | Freq: Once | INTRAVENOUS | Status: AC
  Administered 2019-07-13: 0.25 mg via INTRAVENOUS

## 2019-07-13 NOTE — Patient Instructions (Signed)

## 2019-07-13 NOTE — Patient Instructions (Signed)
[facility] Discharge Instructions for Patients Receiving Chemotherapy  Today you received the following chemotherapy agents Gemcitabine (GEMZAR) & Cisplatin (PLATINOL).  To help prevent nausea and vomiting after your treatment, we encourage you to take your nausea medication as prescribed.   If you develop nausea and vomiting that is not controlled by your nausea medication, call the clinic.   BELOW ARE SYMPTOMS THAT SHOULD BE REPORTED IMMEDIATELY:  *FEVER GREATER THAN 100.5 F  *CHILLS WITH OR WITHOUT FEVER  NAUSEA AND VOMITING THAT IS NOT CONTROLLED WITH YOUR NAUSEA MEDICATION  *UNUSUAL SHORTNESS OF BREATH  *UNUSUAL BRUISING OR BLEEDING  TENDERNESS IN MOUTH AND THROAT WITH OR WITHOUT PRESENCE OF ULCERS  *URINARY PROBLEMS  *BOWEL PROBLEMS  UNUSUAL RASH Items with * indicate a potential emergency and should be followed up as soon as possible.  Feel free to call the clinic should you have any questions or concerns. The clinic phone number is [phone].  Please show the CHEMO ALERT CARD at check-in to the Emergency Department and triage nurse.  Coronavirus (COVID-19) Are you at risk?  Are you at risk for the Coronavirus (COVID-19)?  To be considered HIGH RISK for Coronavirus (COVID-19), you have to meet the following criteria:  . Traveled to [place], [place], [place], [place] or [place]; or in the [place] to [place], [place], [place], or [place]; and have fever, cough, and shortness of breath within the last 2 weeks of travel OR . Been in close contact with a person diagnosed with COVID-19 within the last 2 weeks and have fever, cough, and shortness of breath . IF YOU DO NOT MEET THESE CRITERIA, YOU ARE CONSIDERED LOW RISK FOR COVID-19.  What to do if you are HIGH RISK for COVID-19?  . If you are having a medical emergency, call 911. . Seek medical care right away. Before you go to a doctor's office, urgent care or emergency department, call  ahead and tell them about your recent travel, contact with someone diagnosed with COVID-19, and your symptoms. You should receive instructions from your physician's office regarding next steps of care.  . When you arrive at healthcare provider, tell the healthcare staff immediately you have returned from visiting [place], [place], [place], [place] or [place]; or traveled in the [place] to [place], [place], [place], or [place]; in the last two weeks or you have been in close contact with a person diagnosed with COVID-19 in the last 2 weeks.   . Tell the health care staff about your symptoms: fever, cough and shortness of breath. . After you have been seen by a medical provider, you will be either: o Tested for (COVID-19) and discharged home on quarantine except to seek medical care if symptoms worsen, and asked to  - Stay home and avoid contact with others until you get your results (4-5 days)  - Avoid travel on public transportation if possible (such as bus, train, or airplane) or o Sent to the Emergency Department by EMS for evaluation, COVID-19 testing, and possible admission depending on your condition and test results.  What to do if you are LOW RISK for COVID-19?  Reduce your risk of any infection by using the same precautions used for avoiding the common cold or flu:  . Wash your hands often with soap and warm water for at least 20 seconds.  If soap and water are not readily available, use an alcohol-based hand sanitizer with at least 60% alcohol.  .   If coughing or sneezing, cover your mouth and nose by coughing or sneezing into the elbow areas of your shirt or coat, into a tissue or into your sleeve (not your hands). . Avoid shaking hands with others and consider head nods or verbal greetings only. . Avoid touching your eyes, nose, or mouth with unwashed hands.  . Avoid close contact with people who are sick. . Avoid places or events with large numbers of people in one location,  like concerts or sporting events. . Carefully consider travel plans you have or are making. . If you are planning any travel outside or inside the [place], visit the CDC's Travelers' Health webpage for the latest health notices. . If you have some symptoms but not all symptoms, continue to monitor at home and seek medical attention if your symptoms worsen. . If you are having a medical emergency, call 911.   ADDITIONAL HEALTHCARE OPTIONS FOR PATIENTS  [facility] / e-Visit: [email or web address]         [facility]: [phone]  [facility]: [phone]                   [facility]: [phone]    

## 2019-07-14 LAB — CANCER ANTIGEN 19-9: CA 19-9: 792 U/mL — ABNORMAL HIGH (ref 0–35)

## 2019-07-21 ENCOUNTER — Other Ambulatory Visit: Payer: Self-pay | Admitting: Cardiovascular Disease

## 2019-07-21 ENCOUNTER — Other Ambulatory Visit: Payer: Self-pay | Admitting: Hematology

## 2019-07-25 ENCOUNTER — Other Ambulatory Visit: Payer: Self-pay | Admitting: Thoracic Surgery (Cardiothoracic Vascular Surgery)

## 2019-07-25 NOTE — Progress Notes (Signed)
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 Lymph node, biopsy, mediastinal - LYMPH NODE WITH METASTATIC ADENOCARCINOMA. - SEE MICROSCOPIC DESCRIPTION 2. Plaque, coronary artery - CALCIFIED ATHEROSCLEROTIC PLAQUE.   10/14/2018 Miscellaneous   Foundation One:  MSI stable tumor mutation burden 3Muts/mb ERBB2 amplification CCNE1 amplification TP53 mutation(+) CDK6 amplification  HGF amplification    11/13/2018 Cancer Staging   Staging form: Gallbladder, AJCC 8th Edition - Clinical stage from 11/13/2018: Stage IVB (cTX, cN2, pM1) - Signed by Truitt Merle, MD on 12/15/2018   11/17/2018 Imaging   11/17/2018 CT CAP IMPRESSION: 1. 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 This is associated with extensive upper abdominal and retroperitoneal lymphadenopathy, as well as metastatic lymphadenopathy in the posterior mediastinum and left supraclavicular region. There is also a metastatic lesion to the left ischium. 2. 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 3. Aortic atherosclerosis, in addition to left main and 3 vessel coronary artery disease. Status post median sternotomy for CABG including LIMA to the LAD. 4. There are calcifications of the aortic valve. Echocardiographic correlation for evaluation  of potential valvular dysfunction may be warranted if clinically indicated.    11/21/2018 Initial Diagnosis   Gallbladder cancer ([place])   11/27/2018 Pathology Results   11/27/2018 CA19-9 immunohistochemical stain Per request, a CA19-9 immunohistochemical stain was performed at an outside institution revealing positive staining in the tumor cells.    12/15/2018 -  Chemotherapy    first line chemo cisplatin and gemcitabine every 2 weeks on 12/15/18   02/20/2019 PET scan   Restaging PET scan:  IMPRESSION: 1. Positive response to therapy at all primary and metastatic sites. Persistent primary and metastatic lesions do have intense hypermetabolic activity albeit reduced. 2. Decrease in size and hypermetabolic activity of mass lesion in the gallbladder fundus. 3. Interval decrease in size and metabolic activity of periportal adenopathy. 4. Decrease in size and metabolic activity of periaortic lymph nodes. 5. Interval decrease in size and metabolic activity of destructive lesion in the LEFT inferior pubic ramus with new sclerosis. 6. No new or progressive disease.   05/29/2019 Imaging   CT CAP W Contrast  IMPRESSION: Known gallbladder adenocarcinoma, difficult to compare to recent PET, improved from prior CT.  Upper abdominal/retroperitoneal lymphadenopathy, slightly improved from prior PET.  Osseous metastasis involving the left inferior pubic ramus, grossly unchanged.  No evidence of metastatic disease in the chest.  Additional ancillary findings as above.      CURRENT THERAPY:  -First linechemo cisplatin and gemcitabineevery 2 weekson 12/15/18. Had 1 month chemo breakin April, restarted on 04/20/19 -Zometa q12weeks starting 01/26/19   INTERVAL HISTORY:  Cassie Campbell is here for a follow up and treatment. She presents to the clinic alone. She notes she is doing well. She notes after her back surgery in 2018 she having chronic left upper leg pain. This worsened  after grafting of  vein. This aches for her. She has been taking Celebrex or Tylenol for her pain.     REVIEW OF SYSTEMS:   Constitutional: Denies fevers, chills or abnormal weight loss Eyes: Denies blurriness of vision Ears, nose, mouth, throat, and face: Denies mucositis or sore throat Respiratory: Denies cough, dyspnea or wheezes Cardiovascular: Denies palpitation, chest discomfort or lower extremity swelling Gastrointestinal:  Denies nausea, heartburn or change in bowel habits Skin: Denies abnormal skin rashes MSK: (+) Chronic Left upper leg aching  Lymphatics: Denies new lymphadenopathy or easy bruising Neurological:Denies numbness, tingling or new weaknesses Behavioral/Psych: Mood is stable, no new changes  All other systems were reviewed with the patient and are negative.  MEDICAL HISTORY:  Past Medical History:  Diagnosis Date   Allergic rhinitis    Arthritis    Asthma    xolair s 8/05 ?11/07; mastered hfa 12/20/08   Benign positional vertigo    CAD (coronary artery disease)    a. 09/2014 NSTEMI s/p LHC with sig 2V dz. dLAD diffusely diseased and not suitable for PCI. unsuccessful RCA PCI d/t heavy calcifications   GERD (gastroesophageal reflux disease)    Heart attack ([place])    09/2014   Hyperlipidemia    <130 ldl pos fm hx, bp   Hypertension    Osteopenia    dexa 08/22/07 AP spine + 1.1, left femur -1.3, right femur -.8; dexa 10/06/09 +1.6, left femur =1.6, right femur -.   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 breast bx--for microcalcifications in rt. retroareolar region--dx was hyalinized fibroadenoma   BUNIONECTOMY Bilateral    CARDIAC CATHETERIZATION     2015   CATARACT EXTRACTION Right 02/02/2018   Dr Satira Sark   CATARACT EXTRACTION Left  03/30/2018   CERVICAL [place] SURGERY  12/01   CORONARY ARTERY BYPASS GRAFT N/A 10/04/2018   Procedure: CORONARY ARTERY BYPASS GRAFTING (CABG) times 2 using left  Internal mammary artery to LAD, left greater saphenous vein - open harvest.;  Surgeon: [name], MD;  Location: [facility];  Service: Open Heart Surgery;  Laterality: N/A;   IR IMAGING GUIDED PORT INSERTION  12/08/2018   LEFT HEART CATH AND CORONARY ANGIOGRAPHY N/A 09/27/2018   Procedure: LEFT HEART CATH AND CORONARY ANGIOGRAPHY;  Surgeon: Troy Sine, MD;  Location: [facility];  Service: Cardiovascular;  Laterality: N/A;   LEFT HEART CATHETERIZATION WITH CORONARY ANGIOGRAM N/A 10/16/2014   Procedure: LEFT HEART CATHETERIZATION WITH CORONARY ANGIOGRAM;  Surgeon: Blane Ohara, MD;  Location: [facility] CATH LAB;  Service: Cardiovascular;  Laterality: N/A;   Left L4-5 transforaminal lumbar interbody fusion with Depuy cage, rods and screws, local and allograft bone graft, Vivigen; bilateral decompression/partial hemilaminectomy lumbar five-sacral one  07/2017   PERCUTANEOUS CORONARY ROTOBLATOR INTERVENTION (PCI-R) N/A 10/17/2014   Procedure: PERCUTANEOUS CORONARY ROTOBLATOR INTERVENTION (PCI-R);  Surgeon: Troy Sine, MD;  Location: [facility] CATH LAB;  Service: Cardiovascular;  Laterality: N/A;   TEE WITHOUT CARDIOVERSION N/A 10/04/2018   Procedure: TRANSESOPHAGEAL ECHOCARDIOGRAM (TEE);  Surgeon: [name], MD;  Location: [place];  Service: Open Heart Surgery;  Laterality: N/A;   VEIN LIGATION AND STRIPPING Left    VIDEO BRONCHOSCOPY Bilateral 02/05/2015   Procedure: VIDEO BRONCHOSCOPY WITHOUT FLUORO;  Surgeon: Tanda Rockers, MD;  Location: [facility];  Service: Endoscopy;  Laterality: Bilateral;   VULVA /PERINEUM BIOPSY  12-30-10   --epidermoid cyst    I have reviewed the social history and family history with the patient and they are unchanged from previous note.  ALLERGIES:  is allergic to fish-derived  products; penicillins; aspirin; brilinta [ticagrelor]; codeine phosphate; and diphenhydramine hcl.  MEDICATIONS:  Current Outpatient Medications  Medication Sig Dispense Refill   albuterol (PROAIR HFA) 108 (90 Base) MCG/ACT inhaler INHALE 1 TO 2 PUFFS BY MOUTH EVERY 4 HOURS AS NEEDED FOR WHEEZING 8.5 g 3   atorvastatin (LIPITOR) 80 MG tablet TAKE 1 TABLET(80 MG) BY MOUTH DAILY 90 tablet 1   bisoprolol (ZEBETA) 10 MG tablet Take 1 tablet (10 mg total) by mouth daily. 90 tablet 1   celecoxib (CELEBREX) 200 MG capsule Take 200 mg by mouth 2 (two) times daily as needed.     cetirizine (ZYRTEC) 10 MG tablet Take 10 mg by mouth at bedtime as needed for allergies.     cholecalciferol 2000 units TABS Take 1 tablet (2,000 Units total) by mouth daily. 30 tablet 3   clopidogrel (PLAVIX) 75 MG tablet Take 1 tablet (75 mg total) by mouth daily. 90 tablet 3   dextromethorphan-guaiFENesin (MUCINEX DM) 30-600 MG per 12 hr tablet Take 1-2 tablets by mouth every 12 (twelve) hours as needed for cough (with flutter).      famotidine (PEPCID) 20 MG tablet Take 20 mg by mouth as needed.      ferrous sulfate 325 (65 FE) MG tablet Take 650 mg by mouth daily with breakfast.      fluticasone (FLONASE) 50 MCG/ACT nasal spray Place 1-2 sprays into both nostrils 2 (two) times daily as needed for allergies or rhinitis.     HYDROcodone-acetaminophen (NORCO/VICODIN) 5-325 MG tablet Take 1 tablet by mouth every 6 (six) hours as needed for moderate pain. 30 tablet 0   lidocaine-prilocaine (EMLA) cream Apply to affected area once 30 g 3   magic mouthwash SOLN Take 5 mLs by mouth 4 (four) times daily. Leave Benadryl out of compound since pt allergic to it. Use hydrocortisone & nystatin. Swish & swallow or spit for thrush 240 mL 1   magnesium oxide (MAGNESIUM-OXIDE) 400 (241.3 Mg) MG tablet Take 1 tablet (400 mg total) by mouth 2 (two) times daily. 60 tablet 1   meclizine (ANTIVERT) 25 MG tablet TAKE 2 TABLETS BY  MOUTH THREE TIMES DAILY AS NEEDED 24 tablet 2   montelukast (SINGULAIR) 10 MG tablet TAKE 1 TABLET(10 MG) BY MOUTH AT BEDTIME 90 tablet 1   Multiple Vitamin (MULTIVITAMIN) capsule Take 1 capsule by mouth daily.      omeprazole (PRILOSEC) 20 MG capsule Take 20 mg by mouth as needed.      ondansetron (ZOFRAN) 8 MG tablet Take 1 tablet (8 mg total) by mouth 2 (two) times daily as needed. Start on the third day after chemotherapy. 30 tablet 1   potassium chloride SA (K-DUR) 20 MEQ tablet TAKE 1 TABLET(20 MEQ) BY MOUTH TWICE DAILY (Patient taking differently: Take 20 mEq by mouth daily. ) 60 tablet 2   prochlorperazine (COMPAZINE) 10 MG tablet Take 1 tablet (10 mg total) by mouth every 6 (six) hours as needed (Nausea or vomiting). 30 tablet 1   SYMBICORT 160-4.5 MCG/ACT inhaler INHALE 2 PUFFS BY MOUTH EVERY 12 HOURS 10.2 g 11   furosemide (LASIX) 20 MG tablet Take 2 tablets (40 mg total) by mouth daily. 180 tablet 3   No current facility-administered medications  for this visit.     PHYSICAL EXAMINATION: ECOG PERFORMANCE STATUS: 0 - Asymptomatic  Vitals:   07/27/19 0842  BP: 140/75  Pulse: 72  Resp: 18  Temp: 97.8 F (36.6 C)  SpO2: 100%   Filed Weights   07/27/19 0842  Weight: 125 lb 12.8 oz (57.1 kg)    GENERAL:alert, no distress and comfortable SKIN: skin color, texture, turgor are normal, no rashes or significant lesions EYES: normal, Conjunctiva are pink and non-injected, sclera clear  NECK: supple, thyroid normal size, non-tender, without nodularity LYMPH:  no palpable lymphadenopathy in the cervical, axillary  LUNGS: clear to auscultation and percussion with normal breathing effort HEART: regular rate & rhythm and no murmurs and no lower extremity edema ABDOMEN:abdomen soft, non-tender and normal bowel sounds Musculoskeletal:no cyanosis of digits and no clubbing  NEURO: alert & oriented x 3 with fluent speech, no focal motor/sensory deficits  LABORATORY DATA:  I  have reviewed the data as listed CBC Latest Ref Rng & Units 07/27/2019 07/13/2019 06/29/2019  WBC 4.0 - 10.5 K/uL 5.8 5.9 5.6  Hemoglobin 12.0 - 15.0 g/dL 10.7(L) 10.5(L) 10.7(L)  Hematocrit 36.0 - 46.0 % 33.3(L) 32.9(L) 32.8(L)  Platelets 150 - 400 K/uL 227 199 202     CMP Latest Ref Rng & Units 07/27/2019 07/13/2019 06/29/2019  Glucose 70 - 99 mg/dL 94 103(H) 90  BUN 8 - 23 mg/dL 12 16 14   Creatinine 0.44 - 1.00 mg/dL 0.85 0.98 0.83  Sodium 135 - 145 mmol/L 139 140 139  Potassium 3.5 - 5.1 mmol/L 5.1 4.7 4.6  Chloride 98 - 111 mmol/L 106 106 106  CO2 22 - 32 mmol/L 22 23 22   Calcium 8.9 - 10.3 mg/dL 9.5 9.9 8.7(L)  Total Protein 6.5 - 8.1 g/dL 8.3(H) 8.3(H) 8.5(H)  Total Bilirubin 0.3 - 1.2 mg/dL 0.2(L) 0.3 0.3  Alkaline Phos 38 - 126 U/L 95 85 91  AST 15 - 41 U/L 27 24 28   ALT 0 - 44 U/L 17 15 17       RADIOGRAPHIC STUDIES: I have personally reviewed the radiological images as listed and agreed with the findings in the report. No results found.   ASSESSMENT & PLAN:  Cassie Campbell is a 78 y.o. 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 Tolerating well. -Her[employer]Onegenomic testing results showed MSI stable disease, low tumor mutation burden,notargetable mutation such asFGFR fusion or IDH mutation, but (+) HER2 amplification, may try Herceptin in the future. -She is overall responding well tocurrentregimen. I discussedwe plan to continue until no longer tolerable or she has disease progression. She is fine to take chemo breakas needed.  -I discussed the longer she is on chemo and the more lines of treatment given the her disease response lessens.  -Her CA 19.9 has been trending up recently. Plan for CT scan in 4 weeks. If  mild progression, I may increase Gem/Cis to 2 weeks on/1 week off. I discussed this may increase her side effects. She is willing to consider it.  -Labs reviewed, CBC and CMP WNL except Hg 10.7. Mag 1.7. Overall adequate to proceed with Gem/Cis todayand continue every 2 weeks -Continue oral magnesium and potassium. -f/uin4weeks  2. CAD, S/P CABGX2 on 09/27/2018, EF 40-45% -Currently on Plavix. She also takes lasix during chemo. -f/u with Dr.  Claiborne Billings -stable,she appears to be euvolemic  3. Asthma -Currently on Symbicort, Singulair, and albuterol as needed -f/u with pulmonary, stable, no recent flare  4.Goal of care discussion -The patient understands the goal of care is palliative. -she is full code for now  5.Hypokalemiaand hypomagnesemia -Likely secondary to lasix. Currently on KCL 10mqand mag-ox 4069mnce daily -Due topersistent hypomagnesemia, will increaseoral mag twice daily  -K 5.1 and Mag at 1.7 today (07/27/19). Will decrease K supplement in hydration today    6. Mild Anemia -Secondary to chemoand her recent open heart surgery -Hg at 10.7 today (07/27/19).  -Oral iron is causing her bloating so she takes it every other day. I will check iron study and if adequate, will stop oral iron.   7. Chronic left upper leg pain -likely musculoskeletal at this point and related to prior surgeries. Tolerable, She is still functional -She uses Celebrex and Tylenol.  -I encouraged her to discuss Celebrex use with her cardiologist. She can continue Tylenol for now.   8. Acid Reflux.  -Not controlled on Pepcid. I recommend she try Prilosec which is stronger. She is willing to try   Plan -Labs reviewed and adequate to proceed with gem/Cis today andcontinueevery 2 weeks -Zometa today, continue Zometa every 12 weeks  -lab, flush, gem/Cis in 4 weeks with lab and CT CAP W Contrast a few days before.    No problem-specific Assessment & Plan notes found for this  encounter.   Orders Placed This Encounter  Procedures   CT Abdomen Pelvis W Contrast    Standing Status:   Future    Standing Expiration Date:   07/26/2020    Order Specific Question:   If indicated for the ordered procedure, I authorize the administration of contrast media per Radiology protocol    Answer:   Yes    Order Specific Question:   [facility]    [facility]  Order Specific Question:   Is Oral Contrast requested for this exam?    Answer:   Yes, Per Radiology protocol    Order Specific Question:   Radiology Contrast Protocol - do NOT remove file path    Answer:   \[email or web address]   CT Chest W Contrast    Pt on chemo for metastatic cancer, restaging    Standing Status:   Future    Standing Expiration Date:   07/26/2020    Order Specific Question:   If indicated for the ordered procedure, I authorize the administration of contrast media per Radiology protocol    Answer:   Yes    Order Specific Question:   [facility]    [facility]  Order Specific Question:   Radiology Contrast Protocol - do NOT remove file path    Answer:   \[email or web address]   All questions were answered. The patient knows to call the clinic with any problems, questions or concerns. No barriers to learning was detected. I spent 15 minutes counseling the patient face to face. The total time spent in the appointment was 25 minutes and more than 50% was on counseling and review of test results     YaTruitt MerleMD 07/27/2019   I, AmJoslyn Devonam acting as scribe for YaTruitt MerleMD.   I have reviewed the above documentation for accuracy and completeness, and I agree with the above.

## 2019-07-27 ENCOUNTER — Inpatient Hospital Stay: Payer: Medicare Other | Attending: Obstetrics

## 2019-07-27 ENCOUNTER — Inpatient Hospital Stay (HOSPITAL_BASED_OUTPATIENT_CLINIC_OR_DEPARTMENT_OTHER): Payer: Medicare Other | Admitting: Hematology

## 2019-07-27 ENCOUNTER — Inpatient Hospital Stay: Payer: Medicare Other

## 2019-07-27 ENCOUNTER — Other Ambulatory Visit: Payer: Self-pay

## 2019-07-27 ENCOUNTER — Telehealth: Payer: Self-pay | Admitting: Hematology

## 2019-07-27 VITALS — BP 140/75 | HR 72 | Temp 97.8°F | Resp 18 | Ht 59.0 in | Wt 125.8 lb

## 2019-07-27 DIAGNOSIS — Z951 Presence of aortocoronary bypass graft: Secondary | ICD-10-CM | POA: Diagnosis not present

## 2019-07-27 DIAGNOSIS — J45909 Unspecified asthma, uncomplicated: Secondary | ICD-10-CM | POA: Insufficient documentation

## 2019-07-27 DIAGNOSIS — Z7189 Other specified counseling: Secondary | ICD-10-CM

## 2019-07-27 DIAGNOSIS — M79652 Pain in left thigh: Secondary | ICD-10-CM | POA: Insufficient documentation

## 2019-07-27 DIAGNOSIS — I2510 Atherosclerotic heart disease of native coronary artery without angina pectoris: Secondary | ICD-10-CM | POA: Insufficient documentation

## 2019-07-27 DIAGNOSIS — E876 Hypokalemia: Secondary | ICD-10-CM | POA: Insufficient documentation

## 2019-07-27 DIAGNOSIS — K219 Gastro-esophageal reflux disease without esophagitis: Secondary | ICD-10-CM | POA: Diagnosis not present

## 2019-07-27 DIAGNOSIS — C23 Malignant neoplasm of gallbladder: Secondary | ICD-10-CM | POA: Diagnosis not present

## 2019-07-27 DIAGNOSIS — C7951 Secondary malignant neoplasm of bone: Secondary | ICD-10-CM | POA: Insufficient documentation

## 2019-07-27 DIAGNOSIS — Z5111 Encounter for antineoplastic chemotherapy: Secondary | ICD-10-CM | POA: Diagnosis not present

## 2019-07-27 DIAGNOSIS — D649 Anemia, unspecified: Secondary | ICD-10-CM | POA: Insufficient documentation

## 2019-07-27 DIAGNOSIS — C771 Secondary and unspecified malignant neoplasm of intrathoracic lymph nodes: Secondary | ICD-10-CM | POA: Insufficient documentation

## 2019-07-27 LAB — CMP (CANCER CENTER ONLY)
ALT: 17 U/L (ref 0–44)
AST: 27 U/L (ref 15–41)
Albumin: 3.5 g/dL (ref 3.5–5.0)
Alkaline Phosphatase: 95 U/L (ref 38–126)
Anion gap: 11 (ref 5–15)
BUN: 12 mg/dL (ref 8–23)
CO2: 22 mmol/L (ref 22–32)
Calcium: 9.5 mg/dL (ref 8.9–10.3)
Chloride: 106 mmol/L (ref 98–111)
Creatinine: 0.85 mg/dL (ref 0.44–1.00)
GFR, Est AFR Am: >60 mL/min (ref >60)
GFR, Estimated: >60 mL/min (ref >60)
Glucose, Bld: 94 mg/dL (ref 70–99)
Potassium: 5.1 mmol/L (ref 3.5–5.1)
Sodium: 139 mmol/L (ref 135–145)
Total Bilirubin: 0.2 mg/dL — ABNORMAL LOW (ref 0.3–1.2)
Total Protein: 8.3 g/dL — ABNORMAL HIGH (ref 6.5–8.1)

## 2019-07-27 LAB — CBC WITH DIFFERENTIAL (CANCER CENTER ONLY)
Abs Immature Granulocytes: 0.02 10*3/uL (ref 0.00–0.07)
Basophils Absolute: 0 10*3/uL (ref 0.0–0.1)
Basophils Relative: 1 %
Eosinophils Absolute: 0.3 10*3/uL (ref 0.0–0.5)
Eosinophils Relative: 6 %
HCT: 33.3 % — ABNORMAL LOW (ref 36.0–46.0)
Hemoglobin: 10.7 g/dL — ABNORMAL LOW (ref 12.0–15.0)
Immature Granulocytes: 0 %
Lymphocytes Relative: 18 %
Lymphs Abs: 1 10*3/uL (ref 0.7–4.0)
MCH: 33 pg (ref 26.0–34.0)
MCHC: 32.1 g/dL (ref 30.0–36.0)
MCV: 102.8 fL — ABNORMAL HIGH (ref 80.0–100.0)
Monocytes Absolute: 0.6 10*3/uL (ref 0.1–1.0)
Monocytes Relative: 10 %
Neutro Abs: 3.8 10*3/uL (ref 1.7–7.7)
Neutrophils Relative %: 65 %
Platelet Count: 227 10*3/uL (ref 150–400)
RBC: 3.24 MIL/uL — ABNORMAL LOW (ref 3.87–5.11)
RDW: 18.9 % — ABNORMAL HIGH (ref 11.5–15.5)
WBC Count: 5.8 10*3/uL (ref 4.0–10.5)
nRBC: 0 % (ref 0.0–0.2)

## 2019-07-27 LAB — MAGNESIUM: Magnesium: 1.7 mg/dL (ref 1.7–2.4)

## 2019-07-27 MED ORDER — ACETAMINOPHEN 325 MG PO TABS
650.0000 mg | ORAL_TABLET | Freq: Once | ORAL | Status: AC
  Administered 2019-07-27: 12:00:00 650 mg via ORAL

## 2019-07-27 MED ORDER — SODIUM CHLORIDE 0.9% FLUSH
10.0000 mL | INTRAVENOUS | Status: DC | PRN
  Administered 2019-07-27: 10 mL
  Filled 2019-07-27: qty 10

## 2019-07-27 MED ORDER — SODIUM CHLORIDE 0.9 % IV SOLN
25.0000 mg/m2 | Freq: Once | INTRAVENOUS | Status: AC
  Administered 2019-07-27: 38 mg via INTRAVENOUS
  Filled 2019-07-27: qty 38

## 2019-07-27 MED ORDER — MAGNESIUM SULFATE 50 % IJ SOLN
Freq: Once | INTRAVENOUS | Status: AC
  Administered 2019-07-27: 10:00:00 via INTRAVENOUS
  Filled 2019-07-27: qty 1000

## 2019-07-27 MED ORDER — FUROSEMIDE 10 MG/ML IJ SOLN
INTRAMUSCULAR | Status: AC
  Filled 2019-07-27: qty 2

## 2019-07-27 MED ORDER — ACETAMINOPHEN 325 MG PO TABS
ORAL_TABLET | ORAL | Status: AC
  Filled 2019-07-27: qty 2

## 2019-07-27 MED ORDER — PALONOSETRON HCL INJECTION 0.25 MG/5ML
0.2500 mg | Freq: Once | INTRAVENOUS | Status: AC
  Administered 2019-07-27: 0.25 mg via INTRAVENOUS

## 2019-07-27 MED ORDER — HEPARIN SOD (PORK) LOCK FLUSH 100 UNIT/ML IV SOLN
500.0000 [IU] | Freq: Once | INTRAVENOUS | Status: AC | PRN
  Administered 2019-07-27: 500 [IU]
  Filled 2019-07-27: qty 5

## 2019-07-27 MED ORDER — SODIUM CHLORIDE 0.9 % IV SOLN
Freq: Once | INTRAVENOUS | Status: AC
  Administered 2019-07-27: 10:00:00 via INTRAVENOUS
  Filled 2019-07-27: qty 250

## 2019-07-27 MED ORDER — SODIUM CHLORIDE 0.9 % IV SOLN
Freq: Once | INTRAVENOUS | Status: AC
  Administered 2019-07-27: 12:00:00 via INTRAVENOUS
  Filled 2019-07-27: qty 5

## 2019-07-27 MED ORDER — SODIUM CHLORIDE 0.45 % IV SOLN
Freq: Once | INTRAVENOUS | Status: DC
  Filled 2019-07-27: qty 1000

## 2019-07-27 MED ORDER — SODIUM CHLORIDE 0.9% FLUSH
10.0000 mL | Freq: Once | INTRAVENOUS | Status: AC | PRN
  Administered 2019-07-27: 10 mL
  Filled 2019-07-27: qty 10

## 2019-07-27 MED ORDER — ALTEPLASE 2 MG IJ SOLR
INTRAMUSCULAR | Status: AC
  Filled 2019-07-27: qty 2

## 2019-07-27 MED ORDER — POTASSIUM CHLORIDE 2 MEQ/ML IV SOLN: Freq: Once | INTRAVENOUS | Status: DC

## 2019-07-27 MED ORDER — ZOLEDRONIC ACID 4 MG/100ML IV SOLN
4.0000 mg | Freq: Once | INTRAVENOUS | Status: AC
  Administered 2019-07-27: 4 mg via INTRAVENOUS
  Filled 2019-07-27: qty 100

## 2019-07-27 MED ORDER — PALONOSETRON HCL INJECTION 0.25 MG/5ML
INTRAVENOUS | Status: AC
  Filled 2019-07-27: qty 5

## 2019-07-27 MED ORDER — FUROSEMIDE 10 MG/ML IJ SOLN
20.0000 mg | Freq: Once | INTRAMUSCULAR | Status: AC
  Administered 2019-07-27: 20 mg via INTRAVENOUS

## 2019-07-27 MED ORDER — SODIUM CHLORIDE 0.9 % IV SOLN
1000.0000 mg/m2 | Freq: Once | INTRAVENOUS | Status: AC
  Administered 2019-07-27: 1520 mg via INTRAVENOUS
  Filled 2019-07-27: qty 39.98

## 2019-07-27 NOTE — Progress Notes (Signed)
Patient with K of 5.1 this AM. Spoke with Dr. Burr Medico and will remove potassium 20 mEq from patient's IVF for today's treatment. Orders have been updated to reflect change.   Leron Croak, PharmD PGY2 Oncology/Hematology Pharmacy Resident 07/27/2019 9:54 AM

## 2019-07-27 NOTE — Telephone Encounter (Signed)
Gave patient avs report and appointments for August/October.

## 2019-07-27 NOTE — Patient Instructions (Signed)
[facility] Discharge Instructions for Patients Receiving Chemotherapy  Today you received the following chemotherapy agents Gemcitabine (GEMZAR) & Cisplatin (PLATINOL).  To help prevent nausea and vomiting after your treatment, we encourage you to take your nausea medication as prescribed.   If you develop nausea and vomiting that is not controlled by your nausea medication, call the clinic.   BELOW ARE SYMPTOMS THAT SHOULD BE REPORTED IMMEDIATELY:  *FEVER GREATER THAN 100.5 F  *CHILLS WITH OR WITHOUT FEVER  NAUSEA AND VOMITING THAT IS NOT CONTROLLED WITH YOUR NAUSEA MEDICATION  *UNUSUAL SHORTNESS OF BREATH  *UNUSUAL BRUISING OR BLEEDING  TENDERNESS IN MOUTH AND THROAT WITH OR WITHOUT PRESENCE OF ULCERS  *URINARY PROBLEMS  *BOWEL PROBLEMS  UNUSUAL RASH Items with * indicate a potential emergency and should be followed up as soon as possible.  Feel free to call the clinic should you have any questions or concerns. The clinic phone number is [phone].  Please show the CHEMO ALERT CARD at check-in to the Emergency Department and triage nurse.  Coronavirus (COVID-19) Are you at risk?  Are you at risk for the Coronavirus (COVID-19)?  To be considered HIGH RISK for Coronavirus (COVID-19), you have to meet the following criteria:  . Traveled to [place], [place], [place], [place] or [place]; or in the [place] to [place], [place], [place], or [place]; and have fever, cough, and shortness of breath within the last 2 weeks of travel OR . Been in close contact with a person diagnosed with COVID-19 within the last 2 weeks and have fever, cough, and shortness of breath . IF YOU DO NOT MEET THESE CRITERIA, YOU ARE CONSIDERED LOW RISK FOR COVID-19.  What to do if you are HIGH RISK for COVID-19?  . If you are having a medical emergency, call 911. . Seek medical care right away. Before you go to a doctor's office, urgent care or emergency department, call  ahead and tell them about your recent travel, contact with someone diagnosed with COVID-19, and your symptoms. You should receive instructions from your physician's office regarding next steps of care.  . When you arrive at healthcare provider, tell the healthcare staff immediately you have returned from visiting [place], [place], [place], [place] or [place]; or traveled in the [place] to [place], [place], [place], or [place]; in the last two weeks or you have been in close contact with a person diagnosed with COVID-19 in the last 2 weeks.   . Tell the health care staff about your symptoms: fever, cough and shortness of breath. . After you have been seen by a medical provider, you will be either: o Tested for (COVID-19) and discharged home on quarantine except to seek medical care if symptoms worsen, and asked to  - Stay home and avoid contact with others until you get your results (4-5 days)  - Avoid travel on public transportation if possible (such as bus, train, or airplane) or o Sent to the Emergency Department by EMS for evaluation, COVID-19 testing, and possible admission depending on your condition and test results.  What to do if you are LOW RISK for COVID-19?  Reduce your risk of any infection by using the same precautions used for avoiding the common cold or flu:  . Wash your hands often with soap and warm water for at least 20 seconds.  If soap and water are not readily available, use an alcohol-based hand sanitizer with at least 60% alcohol.  .   If coughing or sneezing, cover your mouth and nose by coughing or sneezing into the elbow areas of your shirt or coat, into a tissue or into your sleeve (not your hands). . Avoid shaking hands with others and consider head nods or verbal greetings only. . Avoid touching your eyes, nose, or mouth with unwashed hands.  . Avoid close contact with people who are sick. . Avoid places or events with large numbers of people in one location,  like concerts or sporting events. . Carefully consider travel plans you have or are making. . If you are planning any travel outside or inside the [place], visit the CDC's Travelers' Health webpage for the latest health notices. . If you have some symptoms but not all symptoms, continue to monitor at home and seek medical attention if your symptoms worsen. . If you are having a medical emergency, call 911.   ADDITIONAL HEALTHCARE OPTIONS FOR PATIENTS  [facility] / e-Visit: [email or web address]         [facility]: [phone]  [facility]: [phone]                   [facility]: [phone]    

## 2019-07-28 ENCOUNTER — Encounter: Payer: Self-pay | Admitting: Hematology

## 2019-07-29 IMAGING — DX DG CHEST 1V PORT
1 series · 1 of 1 positions shown · non-contrast
Comparison: 09/26/2018

CLINICAL DATA: Coronary artery disease.  Status post CABG today.

EXAM:
PORTABLE CHEST 1 VIEW

[chest]
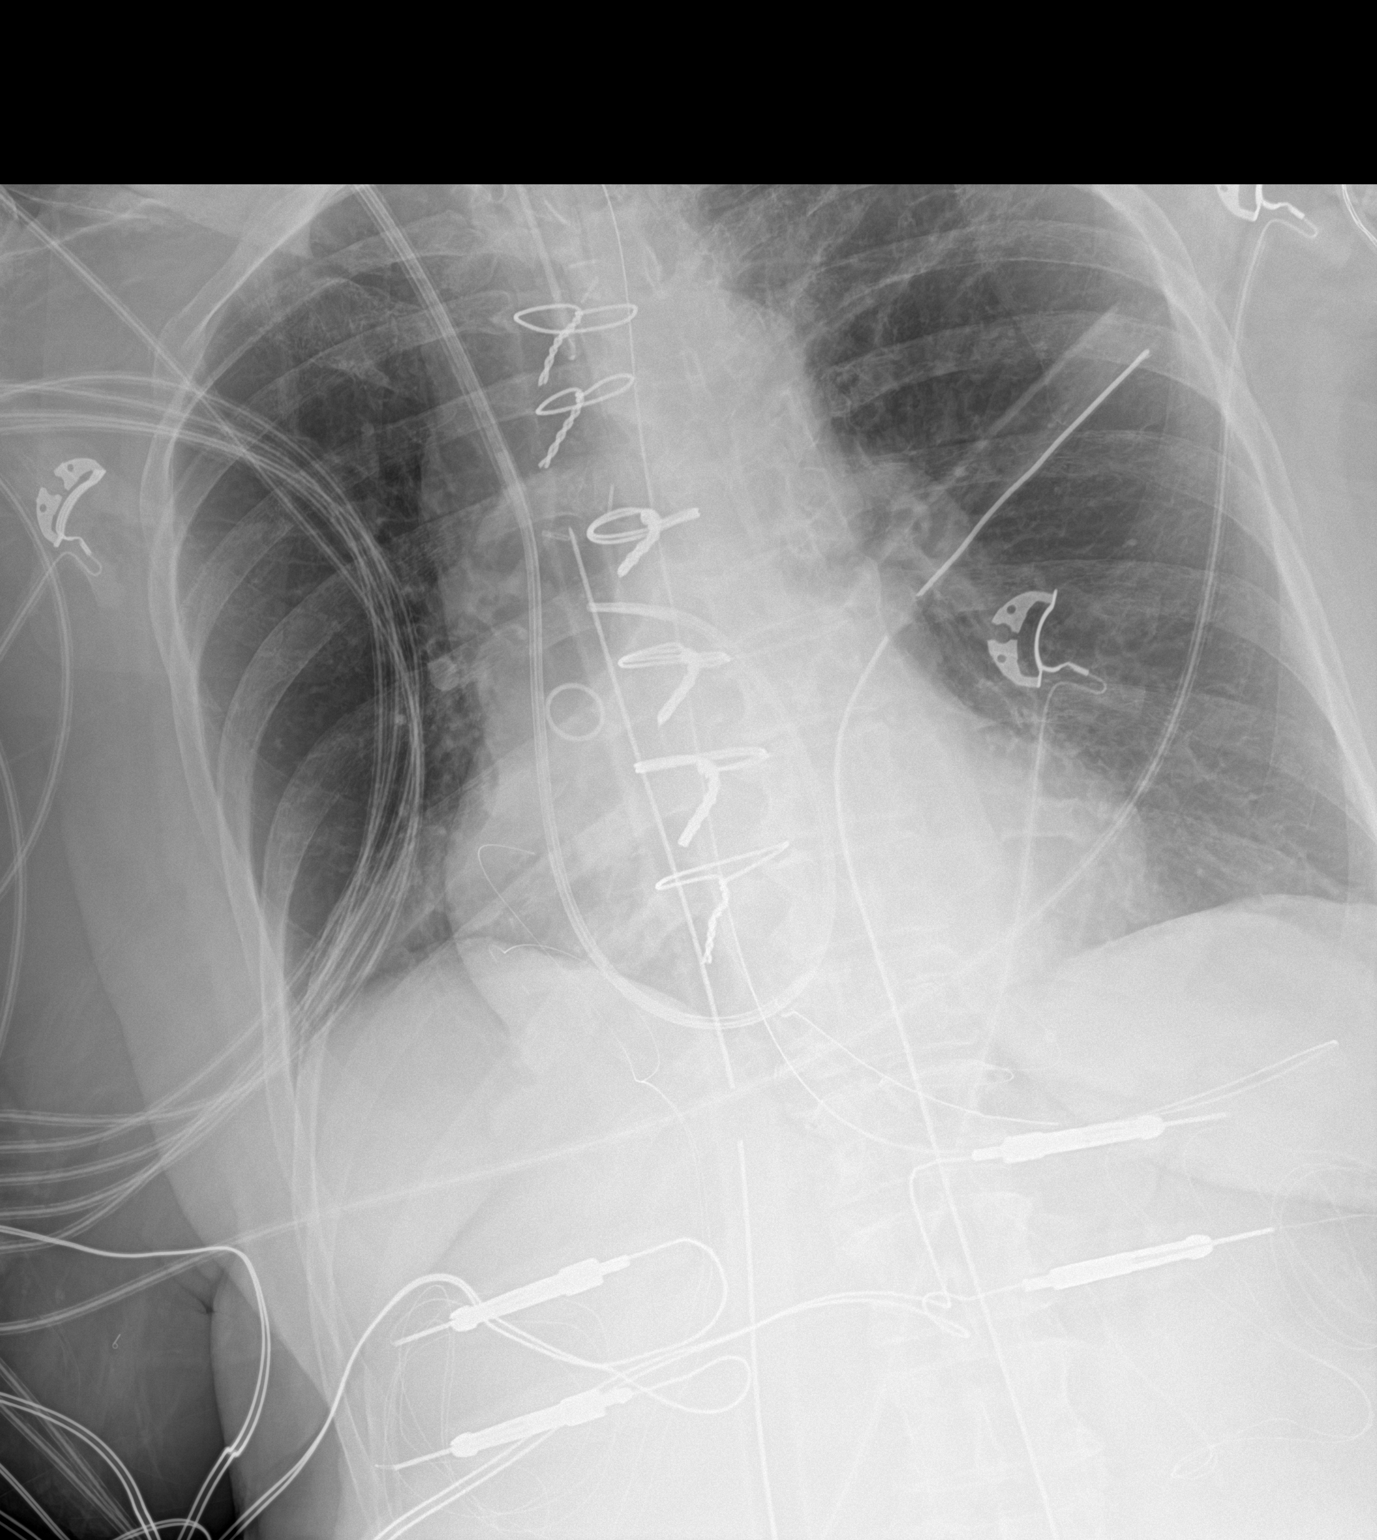

[1 of 1 positions shown; findings below may reference images not displayed]

FINDINGS: Endotracheal tube in good position 4 cm above the carina. NG tube
tip loops into the fundus of the stomach and then back into the
distal esophagus. Swan-Ganz catheter tip in the right main pulmonary
artery. Left chest tube and mediastinal tube in place. No
pneumothorax.

Minimal atelectasis at the right lung base. Lungs are otherwise
clear. Vascularity is normal. No acute bone abnormality.
IMPRESSION: NG tube tip loops back into the distal esophagus from the fundus of
the stomach. Other support apparatus appears in good position.

Minimal atelectasis at the right lung base. Critical Value/emergent
results were called by telephone at the time of interpretation on
10/04/2018 at [DATE] to Majster, RN, who verbally acknowledged these
results.

## 2019-07-30 IMAGING — DX DG CHEST 1V PORT
1 series · 1 of 1 positions shown · non-contrast
Comparison: 10/04/2018.

CLINICAL DATA: Chest tube.  CABG.

EXAM:
PORTABLE CHEST 1 VIEW

[chest]
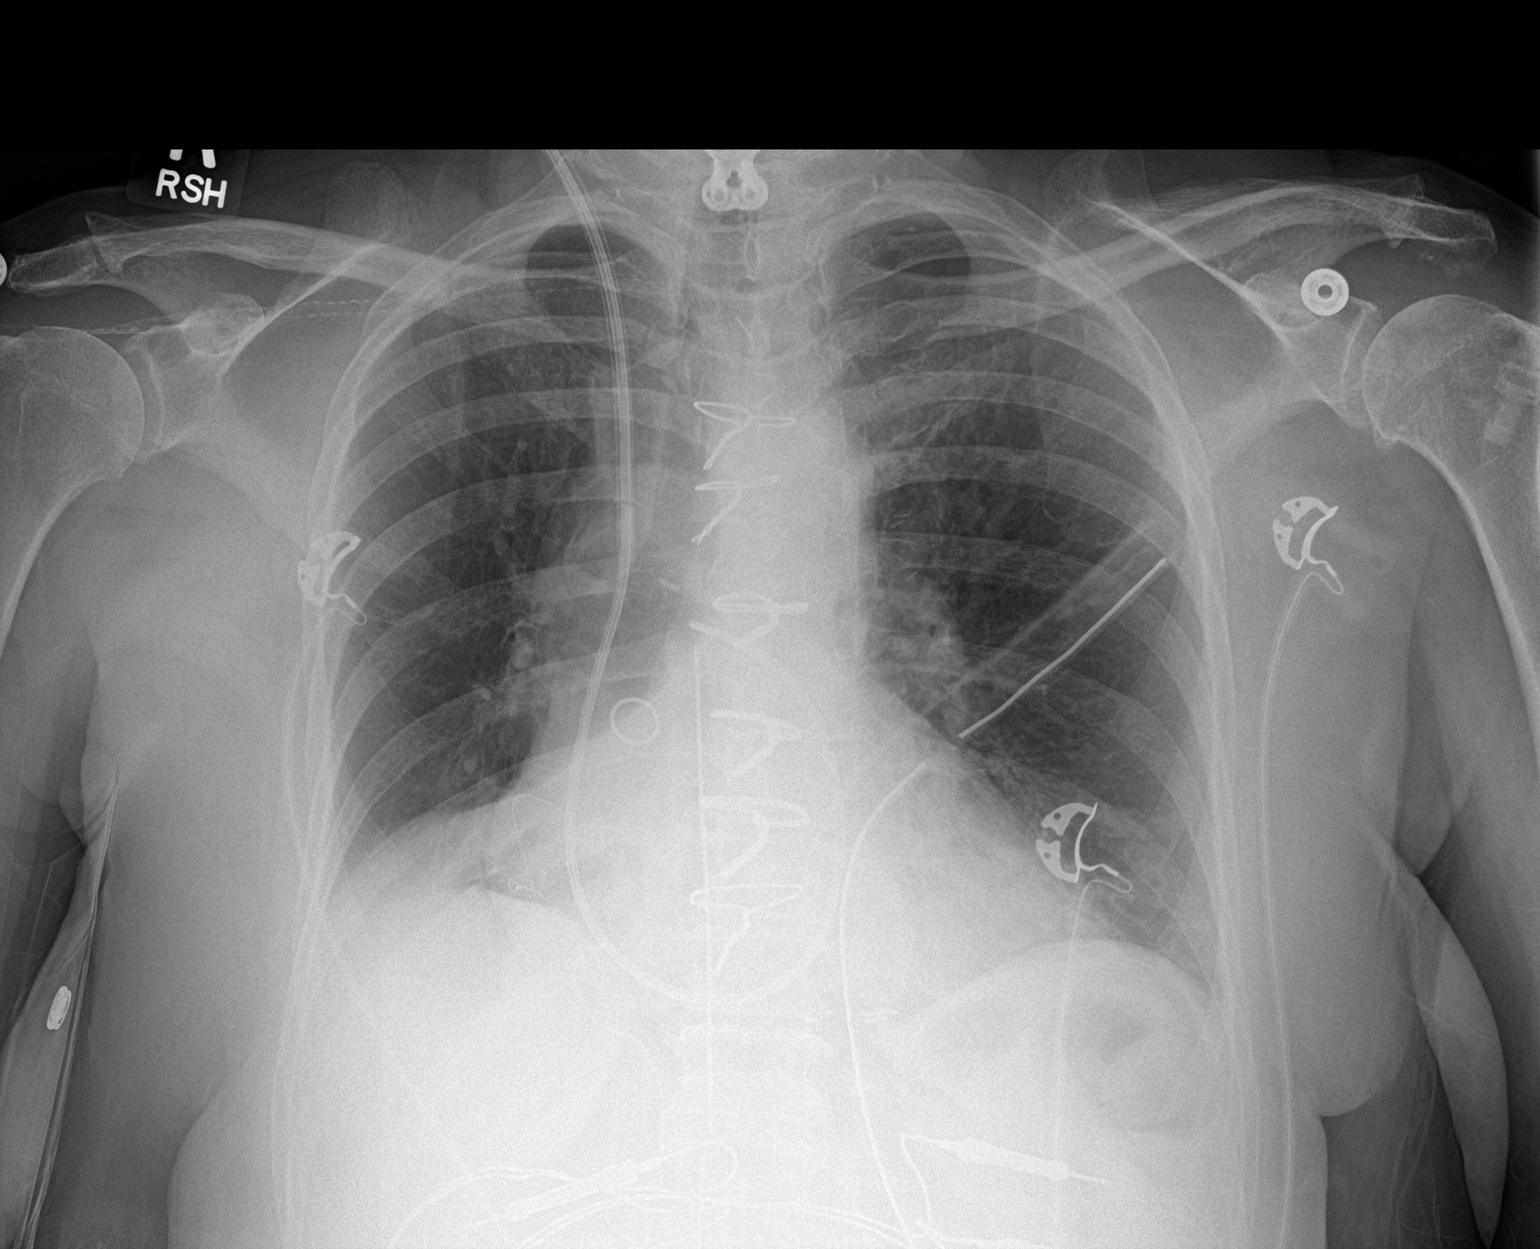

[1 of 1 positions shown; findings below may reference images not displayed]

FINDINGS: Interim removal of endotracheal tube and NG tube. Mediastinal
drainage catheter, Swan-Ganz catheter, left chest tube in stable
position. Prior CABG. Right lower lobe atelectasis and
consolidation. Prior cervicothoracic spine fusion.
IMPRESSION: 1.  Interim removal of endotracheal tube and NG tube.

2. Remaining lines and tubes including left chest tube in stable
position.

3.  Right lower lobe atelectasis.

## 2019-07-31 IMAGING — DX DG CHEST 1V PORT
1 series · 1 of 1 positions shown · non-contrast
Comparison: 10/05/2018

CLINICAL DATA: Status post coronary bypass grafting

EXAM:
PORTABLE CHEST 1 VIEW

[chest]
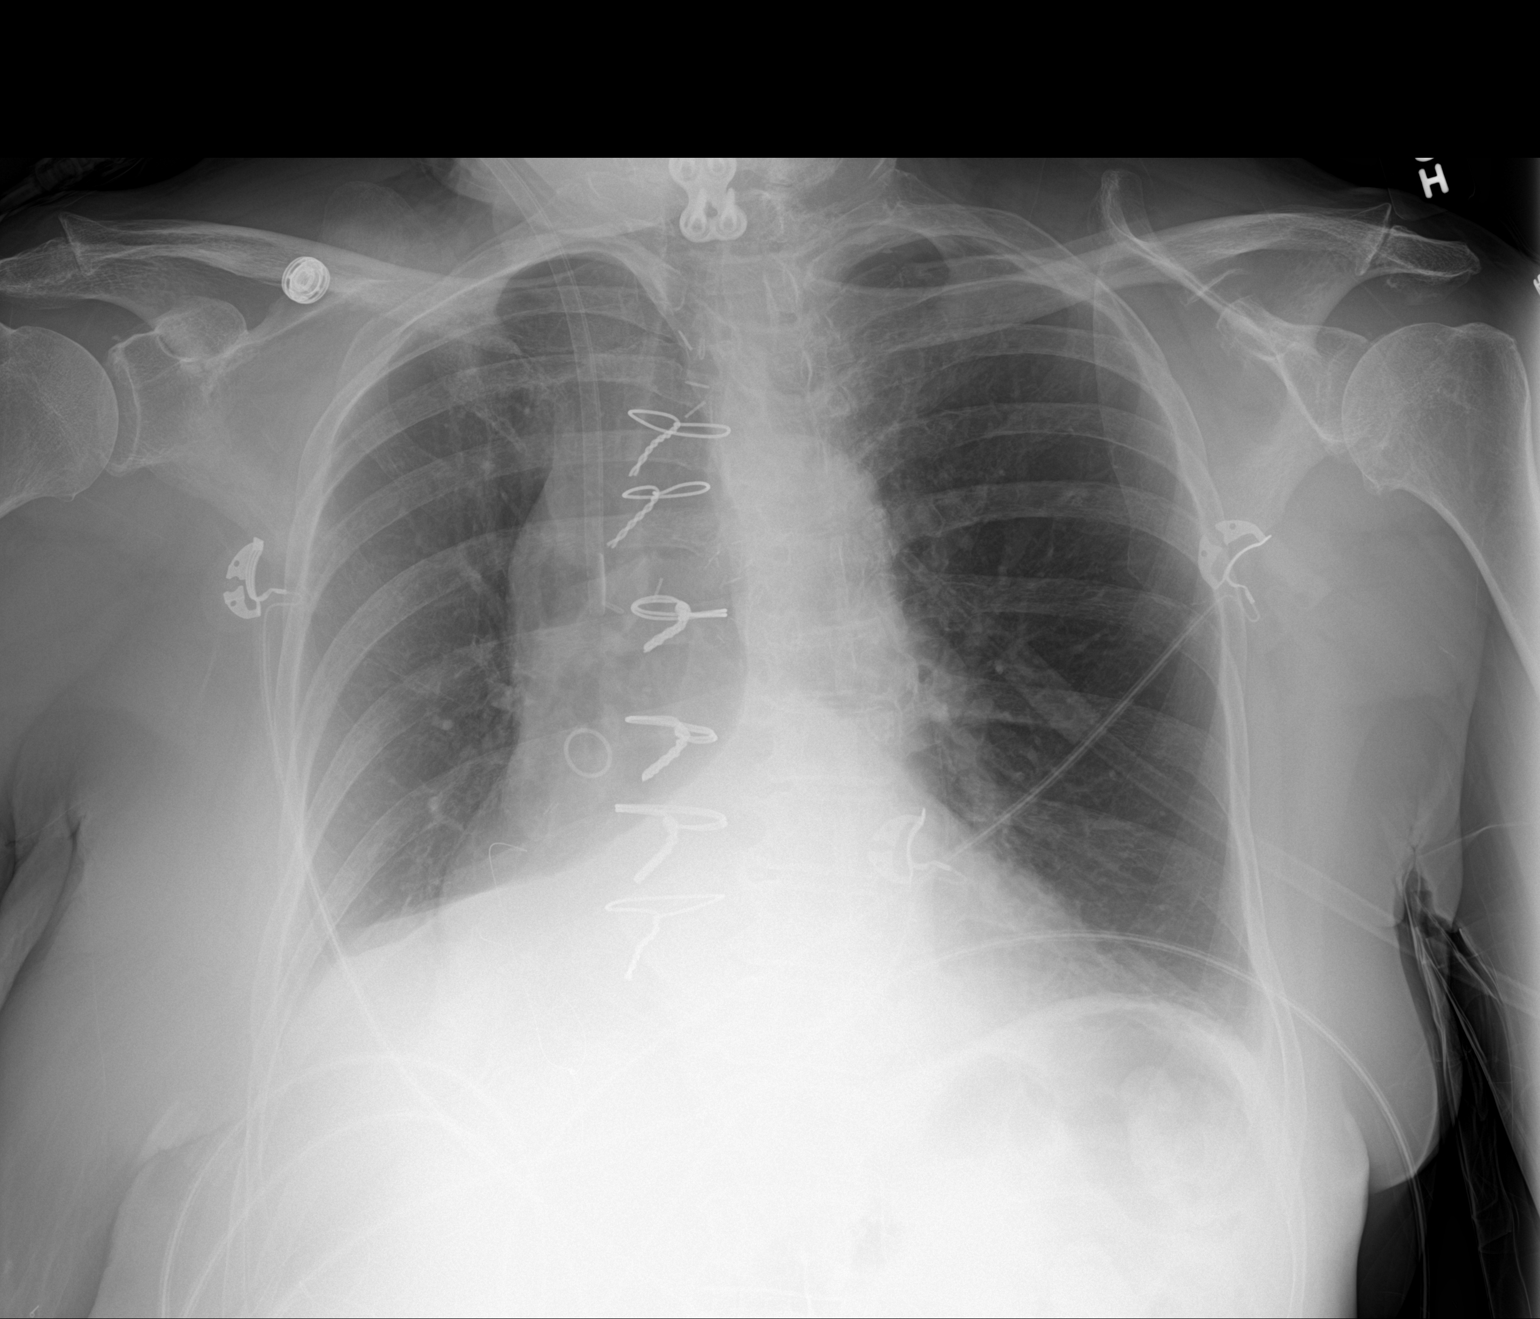

[1 of 1 positions shown; findings below may reference images not displayed]

FINDINGS: Cardiac shadow is mildly enlarged but stable. The Swan-Ganz catheter
has been removed in the interval. Mediastinal drain and left
thoracostomy catheter have also been removed in the interval. No
definitive pneumothorax is seen. Right basilar atelectasis and small
effusion are noted stable from the prior exam.
IMPRESSION: No pneumothorax following chest tube removal.

Right basilar atelectasis and associated effusion.

## 2019-08-10 ENCOUNTER — Inpatient Hospital Stay: Payer: Medicare Other

## 2019-08-10 ENCOUNTER — Other Ambulatory Visit: Payer: Self-pay

## 2019-08-10 ENCOUNTER — Telehealth: Payer: Self-pay | Admitting: *Deleted

## 2019-08-10 VITALS — BP 148/86 | HR 65 | Temp 98.5°F | Resp 16

## 2019-08-10 DIAGNOSIS — C23 Malignant neoplasm of gallbladder: Secondary | ICD-10-CM

## 2019-08-10 DIAGNOSIS — Z5111 Encounter for antineoplastic chemotherapy: Secondary | ICD-10-CM | POA: Diagnosis not present

## 2019-08-10 DIAGNOSIS — Z7189 Other specified counseling: Secondary | ICD-10-CM

## 2019-08-10 LAB — CMP (CANCER CENTER ONLY)
ALT: 16 U/L (ref 0–44)
AST: 28 U/L (ref 15–41)
Albumin: 3.5 g/dL (ref 3.5–5.0)
Alkaline Phosphatase: 86 U/L (ref 38–126)
Anion gap: 11 (ref 5–15)
BUN: 12 mg/dL (ref 8–23)
CO2: 25 mmol/L (ref 22–32)
Calcium: 9.3 mg/dL (ref 8.9–10.3)
Chloride: 103 mmol/L (ref 98–111)
Creatinine: 0.94 mg/dL (ref 0.44–1.00)
GFR, Est AFR Am: >60 mL/min (ref >60)
GFR, Estimated: 59 mL/min — ABNORMAL LOW (ref >60)
Glucose, Bld: 91 mg/dL (ref 70–99)
Potassium: 5.1 mmol/L (ref 3.5–5.1)
Sodium: 139 mmol/L (ref 135–145)
Total Bilirubin: 0.2 mg/dL — ABNORMAL LOW (ref 0.3–1.2)
Total Protein: 8.2 g/dL — ABNORMAL HIGH (ref 6.5–8.1)

## 2019-08-10 LAB — CBC WITH DIFFERENTIAL (CANCER CENTER ONLY)
Abs Immature Granulocytes: 0.03 10*3/uL (ref 0.00–0.07)
Basophils Absolute: 0 10*3/uL (ref 0.0–0.1)
Basophils Relative: 1 %
Eosinophils Absolute: 0.3 10*3/uL (ref 0.0–0.5)
Eosinophils Relative: 6 %
HCT: 30.8 % — ABNORMAL LOW (ref 36.0–46.0)
Hemoglobin: 10.2 g/dL — ABNORMAL LOW (ref 12.0–15.0)
Immature Granulocytes: 1 %
Lymphocytes Relative: 17 %
Lymphs Abs: 0.9 10*3/uL (ref 0.7–4.0)
MCH: 33.3 pg (ref 26.0–34.0)
MCHC: 33.1 g/dL (ref 30.0–36.0)
MCV: 100.7 fL — ABNORMAL HIGH (ref 80.0–100.0)
Monocytes Absolute: 0.6 10*3/uL (ref 0.1–1.0)
Monocytes Relative: 11 %
Neutro Abs: 3.7 10*3/uL (ref 1.7–7.7)
Neutrophils Relative %: 64 %
Platelet Count: 191 10*3/uL (ref 150–400)
RBC: 3.06 MIL/uL — ABNORMAL LOW (ref 3.87–5.11)
RDW: 19.2 % — ABNORMAL HIGH (ref 11.5–15.5)
WBC Count: 5.6 10*3/uL (ref 4.0–10.5)
nRBC: 0.4 % — ABNORMAL HIGH (ref 0.0–0.2)

## 2019-08-10 LAB — MAGNESIUM: Magnesium: 1.4 mg/dL — CL (ref 1.7–2.4)

## 2019-08-10 MED ORDER — FUROSEMIDE 10 MG/ML IJ SOLN
20.0000 mg | Freq: Once | INTRAMUSCULAR | Status: AC
  Administered 2019-08-10: 12:00:00 20 mg via INTRAVENOUS

## 2019-08-10 MED ORDER — ACETAMINOPHEN 325 MG PO TABS
ORAL_TABLET | ORAL | Status: AC
  Filled 2019-08-10: qty 2

## 2019-08-10 MED ORDER — POTASSIUM CHLORIDE 2 MEQ/ML IV SOLN
Freq: Once | INTRAVENOUS | Status: AC
  Administered 2019-08-10: 10:00:00 via INTRAVENOUS
  Filled 2019-08-10: qty 1000

## 2019-08-10 MED ORDER — SODIUM CHLORIDE 0.9 % IV SOLN
Freq: Once | INTRAVENOUS | Status: AC
  Administered 2019-08-10: 10:00:00 via INTRAVENOUS
  Filled 2019-08-10: qty 250

## 2019-08-10 MED ORDER — FUROSEMIDE 10 MG/ML IJ SOLN
INTRAMUSCULAR | Status: AC
  Filled 2019-08-10: qty 2

## 2019-08-10 MED ORDER — PALONOSETRON HCL INJECTION 0.25 MG/5ML
0.2500 mg | Freq: Once | INTRAVENOUS | Status: AC
  Administered 2019-08-10: 12:00:00 0.25 mg via INTRAVENOUS

## 2019-08-10 MED ORDER — SODIUM CHLORIDE 0.9% FLUSH
10.0000 mL | Freq: Once | INTRAVENOUS | Status: AC | PRN
  Administered 2019-08-10: 10 mL
  Filled 2019-08-10: qty 10

## 2019-08-10 MED ORDER — HEPARIN SOD (PORK) LOCK FLUSH 100 UNIT/ML IV SOLN
500.0000 [IU] | Freq: Once | INTRAVENOUS | Status: AC | PRN
  Administered 2019-08-10: 17:00:00 500 [IU]
  Filled 2019-08-10: qty 5

## 2019-08-10 MED ORDER — SODIUM CHLORIDE 0.9% FLUSH
10.0000 mL | INTRAVENOUS | Status: DC | PRN
  Administered 2019-08-10: 10 mL
  Filled 2019-08-10: qty 10

## 2019-08-10 MED ORDER — PALONOSETRON HCL INJECTION 0.25 MG/5ML
INTRAVENOUS | Status: AC
  Filled 2019-08-10: qty 5

## 2019-08-10 MED ORDER — SODIUM CHLORIDE 0.9 % IV SOLN
1000.0000 mg/m2 | Freq: Once | INTRAVENOUS | Status: AC
  Administered 2019-08-10: 14:00:00 1520 mg via INTRAVENOUS
  Filled 2019-08-10: qty 39.98

## 2019-08-10 MED ORDER — POTASSIUM CHLORIDE 2 MEQ/ML IV SOLN
Freq: Once | INTRAVENOUS | Status: DC
  Filled 2019-08-10: qty 1000

## 2019-08-10 MED ORDER — SODIUM CHLORIDE 0.9 % IV SOLN
Freq: Once | INTRAVENOUS | Status: AC
  Administered 2019-08-10: 12:00:00 via INTRAVENOUS
  Filled 2019-08-10: qty 5

## 2019-08-10 MED ORDER — ACETAMINOPHEN 325 MG PO TABS
650.0000 mg | ORAL_TABLET | Freq: Once | ORAL | Status: AC
  Administered 2019-08-10: 13:00:00 650 mg via ORAL

## 2019-08-10 MED ORDER — SODIUM CHLORIDE 0.9 % IV SOLN
25.0000 mg/m2 | Freq: Once | INTRAVENOUS | Status: AC
  Administered 2019-08-10: 38 mg via INTRAVENOUS
  Filled 2019-08-10: qty 38

## 2019-08-10 NOTE — Telephone Encounter (Signed)
Received call report from [facility].  "Today's Mg = 1.4."  Reached collaborative nurse with results and today's next appointment for infusion.

## 2019-08-10 NOTE — Patient Instructions (Signed)

## 2019-08-10 NOTE — Patient Instructions (Signed)
[facility] Discharge Instructions for Patients Receiving Chemotherapy  Today you received the following chemotherapy agents Gemzar and Cisplatin  To help prevent nausea and vomiting after your treatment, we encourage you to take your nausea medication as directed   If you develop nausea and vomiting that is not controlled by your nausea medication, call the clinic.   BELOW ARE SYMPTOMS THAT SHOULD BE REPORTED IMMEDIATELY:  *FEVER GREATER THAN 100.5 F  *CHILLS WITH OR WITHOUT FEVER  NAUSEA AND VOMITING THAT IS NOT CONTROLLED WITH YOUR NAUSEA MEDICATION  *UNUSUAL SHORTNESS OF BREATH  *UNUSUAL BRUISING OR BLEEDING  TENDERNESS IN MOUTH AND THROAT WITH OR WITHOUT PRESENCE OF ULCERS  *URINARY PROBLEMS  *BOWEL PROBLEMS  UNUSUAL RASH Items with * indicate a potential emergency and should be followed up as soon as possible.  Feel free to call the clinic should you have any questions or concerns. The clinic phone number is [phone].  Please show the CHEMO ALERT CARD at check-in to the Emergency Department and triage nurse.   

## 2019-08-11 LAB — CANCER ANTIGEN 19-9: CA 19-9: 1247 U/mL — ABNORMAL HIGH (ref 0–35)

## 2019-08-13 ENCOUNTER — Ambulatory Visit (INDEPENDENT_AMBULATORY_CARE_PROVIDER_SITE_OTHER): Payer: Medicare Other | Admitting: Internal Medicine

## 2019-08-13 ENCOUNTER — Encounter: Payer: Self-pay | Admitting: Internal Medicine

## 2019-08-13 ENCOUNTER — Other Ambulatory Visit: Payer: Self-pay

## 2019-08-13 DIAGNOSIS — Z23 Encounter for immunization: Secondary | ICD-10-CM

## 2019-08-13 DIAGNOSIS — I10 Essential (primary) hypertension: Secondary | ICD-10-CM

## 2019-08-13 DIAGNOSIS — J4540 Moderate persistent asthma, uncomplicated: Secondary | ICD-10-CM | POA: Diagnosis not present

## 2019-08-13 MED ORDER — PREDNISONE 10 MG PO TABS: ORAL_TABLET | ORAL | 0 refills | Status: DC

## 2019-08-13 NOTE — Progress Notes (Signed)
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 Follow in pulmonary clinic also for primary care with hbp/ hyperlipidemia complicated by MI 70/62/37 and persistent mucus plug in RML/ RLL s/p fob 02/05/15 with marked improvement p lavage and no endobrchial lesions, just diffuse airway swelling      History of Present Illness   10/14/13 [name] > laser rx for back pain  rad legs > resolved    Admit Date: 10/15/2014 Discharge date: 10/19/2014    PRIMARY DISCHARGE DIAGNOSIS:  NSTEMI (non-ST elevated myocardial infarction)   Essential hypertension  GERD (gastroesophageal reflux disease)  Chest pain  Hyperlipidemia  Hypertension  CAD (coronary artery disease)       04/11/2017  f/u ov/Sai Moura re: asthma/ hbp/ leg swelling with pain R foot  Chief Complaint  Patient presents with  . Follow-up    Increased cough, wheezing anfd runny nose for the past month.    no change off arnuity/ worse though with nasal symptoms x one month  Wheezing better when using the symb 160 2bid  rec Stop bisoprolol-hct and instead take bisoprolol 5 mg daily  Start lasix (furosemide) 20 mg daily Prednisone 10 mg take  4 each am x 2 days,   2 each am x 2 days,  1 each am x 2 days and stop       08/15/17 [name] back surgery    Admit date: 09/26/2018 Discharge date: 10/09/2018  Admission Diagnoses: 1. Unstable angina ([place]) 2. History of CAD (coronary artery disease), history of NSTEMI  Discharge Diagnoses:  1. S/P CABG x 2 2. Right bundle branch block 3. ABL anemia 4. History of Hyperlipidemia  5. History of Essential hypertension 6. History of obesity 7. History of GERD (gastroesophageal reflux disease) 8. History of Ruptured disc, cervical 9. History of  osteopenia 10. History of Spondylolisthesis at L4-L5 level   Procedure (s):  LEFT HEART CATH AND CORONARY ANGIOGRAPHY by Dr. Claiborne Billings on 09/27/2018:  Conclusion     Acute Mrg lesion is 95% stenosed.  Mid RCA to Dist RCA lesion is 100% stenosed.  Prox RCA to Mid RCA lesion is 50% stenosed.  Mid RCA lesion is 80% stenosed.  Mid LM to Dist LM lesion is 25% stenosed.  Ost LAD lesion by Dr.  is 40% stenosed.  Prox LAD-1 lesion is 95% stenosed.  Prox LAD-2 lesion is 50% stenosed.  Mid LAD-1 lesion is 90% stenosed.  Mid LAD-2 lesion is 80% stenosed.  Dist LAD lesion is 80% stenosed.  The left ventricular ejection fraction is 50-55% by visual estimate.  LV end diastolic pressure is low.  Severe coronary calcification involving the LAD, circumflex, and RCA.  There is smooth 20 to 25% narrowing in the distal left main.  The LAD has 40% proximal calcified stenosis. After the first septal perforating artery and be in the region of the first diagonal vessel the LAD is severely calcified with 95% stenosis. There is diffuse disease in the mid segment with 50% narrowing followed by 90% stenosis with distal 80% stenoses.  Calcified left circumflex vessel without high-grade obstructive disease.  Calcified RCA with 50% proximal 80% mid and total occlusion prior to the acute margin. There is extensive left-to-right collateralization to the distal RCA via the left  circulation.  Low normal global LV function with an ejection fraction at 50 to 55%. There is small focal region of mild mid anterolateral posterior basal inferior hypo-contractility. LVEDP 8 mm Hg.     Median sternotomy, extracorporeal circulation, coronary artery bypass grafting x2 (left internal mammary artery to left anterior descending with endarterectomy and saphenous vein graft to posterior descending by Dr. Roxan Hockey on 10/04/2018.  History of Presenting Illness: [name] is a 78 yo woman with  known CAD with an MI in 78. Attempted PCI- unsuccessful, treated medically. Also has a history of hypertension, hyperlipidemia, GRED, obesity, depression, asthma, arthritis and DDD. 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 Acute Visit    Increased DOE x 2 months. She gets winded walking room to room at home. She is using her proair 4 x daily on average. She has also noticed some chest tightness, wheezing and cough with clear sputum.   Dyspnea:  Room to rom  Cough: more congested/ wheezy  24/7some better with saba  Sleeping: bed 30 degrees SABA use: 4 x daily and noct 02: none  rec Plan A = Automatic = Symbicort 160 mg Take 2 puffs first thing in am and then another 2 puffs about 12 hours later.  Plan B = Backup Only use your  albuterol inhaler as a rescue medication  Prednisone 10 mg take  4 each am x 2 days,   2 each am x 2 days,  1 each am x 2 days and stop  Resume Try prilosec otc 67m  Take 30-60 min before first meal of the day and Pepcid ac (famotidine) 20 mg one @  Bedtime    04/10/2019  f/u ov/Brookelynn Hamor re: chronic asthma/ met gb ca /Jaquelyn BitterChief Complaint  Patient presents with  . Follow-up    states breathing is better since LOV; uses Symbicort daily, uses albuterol as needed   Dyspnea:  MMRC1 = can walk nl pace, flat grade, can't hurry or go uphills or steps s sob   Cough: no Sleeping: 30 degrees with bed blocks /ok  SABA use: none 02: none Zyrtec twice a week working ok  mucinex about the same  rec No change rx   08/13/2019  f/u ov/Dhillon Comunale re: asthma/hbp/IHD met gb ca Chief Complaint  Patient presents with  . Follow-up    overall doing well- has some SOB the days she has chemo txs- has to use proair 2 x daily for a few  days after and then feels better.   Dyspnea:  Uses HC parking / less energy x 4d p  Chemo then improves, really typically Not limited by breathing from desired activities   Cough: none Sleeping: well at 45 degrees  SABA use: bid  02: not    No obvious day to day or daytime variability or assoc excess/ purulent sputum or mucus plugs or hemoptysis or cp or chest tightness, subjective wheeze or overt sinus or hb symptoms.   Sleeping as above  without nocturnal  or early am exacerbation  of respiratory  c/o's or need for noct saba. Also denies any obvious fluctuation of symptoms with weather or environmental changes or other aggravating or alleviating factors except as outlined above   No unusual exposure hx or h/o childhood pna/ asthma or knowledge of premature birth.  Current Allergies, Complete Past Medical History, Past Surgical History, Family History, and Social History were reviewed in [employer].  ROS  The following are not active complaints unless bolded Hoarseness, sore throat, dysphagia, dental problems, itching, sneezing,  nasal congestion or discharge of excess mucus or purulent secretions, ear ache,   fever, chills, sweats, unintended wt loss or wt gain, classically pleuritic or exertional cp,  orthopnea pnd or arm/hand swelling  or leg swelling, presyncope, palpitations, abdominal pain, anorexia, nausea, vomiting, diarrhea  or change in bowel habits or change in bladder habits, change in stools or change in urine, dysuria, hematuria,  rash, arthralgias, visual complaints, headache, numbness, weakness or ataxia or problems with walking or coordination,  change in mood or  memory.        Current Meds  Medication Sig  . albuterol (PROAIR HFA) 108 (90 Base) MCG/ACT inhaler INHALE 1 TO 2 PUFFS BY MOUTH EVERY 4 HOURS AS NEEDED FOR WHEEZING  . atorvastatin (LIPITOR) 80 MG tablet TAKE 1 TABLET(80 MG) BY MOUTH DAILY  . bisoprolol (ZEBETA) 10 MG tablet Take 1  tablet (10 mg total) by mouth daily.  . celecoxib (CELEBREX) 200 MG capsule Take 200 mg by mouth 2 (two) times daily as needed.  . cetirizine (ZYRTEC) 10 MG tablet Take 10 mg by mouth at bedtime as needed for allergies.  . cholecalciferol 2000 units TABS Take 1 tablet (2,000 Units total) by mouth daily.  . clopidogrel (PLAVIX) 75 MG tablet  Take 1 tablet (75 mg total) by mouth daily.  Marland Kitchen dextromethorphan-guaiFENesin (MUCINEX DM) 30-600 MG per 12 hr tablet Take 1-2 tablets by mouth every 12 (twelve) hours as needed for cough (with flutter).   . famotidine (PEPCID) 20 MG tablet Take 20 mg by mouth as needed.   . ferrous sulfate 325 (65 FE) MG tablet Take 650 mg by mouth daily with breakfast.   . fluticasone (FLONASE) 50 MCG/ACT nasal spray Place 1-2 sprays into both nostrils 2 (two) times daily as needed for allergies or rhinitis.  Marland Kitchen HYDROcodone-acetaminophen (NORCO/VICODIN) 5-325 MG tablet Take 1 tablet by mouth every 6 (six) hours as needed for moderate pain.  Marland Kitchen lidocaine-prilocaine (EMLA) cream Apply to affected area once  . magic mouthwash SOLN Take 5 mLs by mouth 4 (four) times daily. Leave Benadryl out of compound since pt allergic to it. Use hydrocortisone & nystatin. Swish & swallow or spit for thrush  . magnesium oxide (MAGNESIUM-OXIDE) 400 (241.3 Mg) MG tablet Take 1 tablet (400 mg total) by mouth 2 (two) times daily. (Patient taking differently: Take 400 mg by mouth 3 (three) times daily. )  . meclizine (ANTIVERT) 25 MG tablet TAKE 2 TABLETS BY MOUTH THREE TIMES DAILY AS NEEDED  . montelukast (SINGULAIR) 10 MG tablet TAKE 1 TABLET(10 MG) BY MOUTH AT BEDTIME  . Multiple Vitamin (MULTIVITAMIN) capsule Take 1 capsule by mouth daily.   Marland Kitchen omeprazole (PRILOSEC) 20 MG capsule Take 20 mg by mouth as needed.   . ondansetron (ZOFRAN) 8 MG tablet Take 1 tablet (8 mg total) by mouth 2 (two) times daily as needed. Start on the third day after chemotherapy.  . potassium chloride SA (K-DUR) 20 MEQ tablet  TAKE 1 TABLET(20 MEQ) BY MOUTH TWICE DAILY (Patient taking differently: Take 20 mEq by mouth daily. )  . prochlorperazine (COMPAZINE) 10 MG tablet Take 1 tablet (10 mg total) by mouth every 6 (six) hours as needed (Nausea or vomiting).  . SYMBICORT 160-4.5 MCG/ACT inhaler INHALE 2 PUFFS BY MOUTH EVERY 12 HOURS                     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)  OSTEOPENIA (ICD-733.90)  - DEXA 08/22/07 AP spine + 1.1, left femur -1.3, right femur -.8  - DEXA 10/06/09 + 1.6 left femur -1.6, right femur -.5   -DEXA 2014 no change  HYPERTENSION (ICD-401.9)  HYPERLIPIDEMIA (ICD-272.4)  - Target < 130 ldl pos fm hx, hbp  OBESITY  - Target wt = 178 for BMI < 30  ASTHMA (ICD-493.90) ([name] not active)  -Xolair s 8/05 > 10/2006  -Mastered HFA December 30, 2008  ALLERGIC RHINITIS (ICD-477.9)  S/p cervical fusion 2000 ............................................................................  [place]...........................................................................Marland KitchenWert  - Td 05/20/2015  - Pneumovax 09/2003 and October 27, 2009  Prevnar 05/07/2014  - Colonscopy 10/28/2004  - CPX 11/19/13     - GYN per [name]'s office - Mammogram 2013 , 2014         Objective:   Physical Exam   08/13/2019   126  Wt 143 11/09/2011 >  04/12/2012  140 > 06/26/2012  139 >  11/13/2012 135 >  137 02/12/2013 >141 05/16/2013 > 07/31/2013  139 >  11/19/2013  134 >135 02/18/2014 > 04/16/2014 134 > 05/07/2014  134 >131 08/06/2014 > 10/30/2014 130 > 11/28/2014 128 > 02/18/2015   133 > 05/20/2015 133 > 07/14/2015   135 >137 08/26/2015 > 11/25/2015 143 > 12/29/2015  137 >  02/23/2016   131 > 06/10/2016   148 > 07/08/2016   148 > 10/06/2016 146 > 04/11/2017  140 >  11/24/2017  136 > 06/08/2018  96% > 11/27/2018  121 >  04/10/2019  121     Vital signs reviewed - Note on arrival 02 sats  97% on RA  HEENT: nl dentition, turbinates bilaterally, and oropharynx. Nl external ear canals without  cough reflex   NECK :  without JVD/Nodes/TM/ nl carotid upstrokes bilaterally   LUNGS: no acc muscle use,  Nl contour chest which is clear to A and P bilaterally without cough on insp or exp maneuvers   CV:  RRR  no s3 or murmur or increase in P2, and no edema   ABD:  soft and nontender with nl inspiratory excursion in the supine position. No bruits or organomegaly appreciated, bowel sounds nl  MS:  Nl gait/ ext warm without deformities, calf tenderness, cyanosis or clubbing No obvious joint restrictions   SKIN: warm and dry without lesions    NEURO:  alert, approp, nl sensorium with  no motor or cerebellar deficits apparent.            Lab Results  Component Value Date   WBC 5.6 08/10/2019   HGB 10.2 (L) 08/10/2019   HCT 30.8 (L) 08/10/2019   MCV 100.7 (H) 08/10/2019   PLT 191 08/10/2019           Assessment & Plan:

## 2019-08-13 NOTE — Patient Instructions (Addendum)
Plan A = Automatic = symbicort 160 Take 2 puffs first thing in am and then another 2 puffs about 12 hours later.   Work on inhaler technique:  relax and gently blow all the way out then take a nice smooth deep breath back in, triggering the inhaler at same time you start breathing in.  Hold for up to 5 seconds if you can. Blow out thru nose. Rinse and gargle with water when done        Plan B = Backup Only use your albuterol inhaler as a rescue medication to be used if you can't catch your breath by resting or doing a relaxed purse lip breathing pattern.  - The less you use it, the better it will work when you need it. - Ok to use the inhaler up to 2 puffs  every 4 hours if you must but call for appointment if use goes up over your usual need - Don't leave home without it !!  (think of it like the spare tire for your car)   Plan C = Contingency: if needing more albuterol than usual > Prednisone 10 mg take  4 each am x 2 days,   2 each am x 2 days,  1 each am x 2 days and stop    Please schedule a follow up visit in 3 months but call sooner if needed

## 2019-08-15 ENCOUNTER — Encounter: Payer: Self-pay | Admitting: Internal Medicine

## 2019-08-15 NOTE — Assessment & Plan Note (Signed)
-  Xolair rx  07/2004 > 10/2006  - IgE  296 11/20/2013  - Prevnar rx 05/07/14  - Spirometry 06/10/2016  FEV1 1.06 (80%)  Ratio 57 - NO   06/10/2016  = 15   - IgE  06/10/16  293  - FENO 07/08/2016  =   9  - 08/13/2019  After extensive coaching inhaler device,  effectiveness =    90%      All goals of chronic asthma control met including optimal function and elimination of symptoms with minimal need for rescue therapy.  Contingencies discussed in full including contacting this office immediately if not controlling the symptoms using the rule of two's.     Doe likely related to anemia, not poorly controlled asthma or pulmonary effects of chemo

## 2019-08-15 NOTE — Assessment & Plan Note (Addendum)
-  Changed lopressor to bisoprolol 11/29/14 due to wheezing  - changed to bisoprolol /hct due to tendency to leg swelling > d/c 04/11/2017 with ?  gout L foot    Adequate control on present rx, no ongoing issues with angina>>  reviewed in detail with pt > no change in rx needed       I had an extended discussion with the patient reviewing all relevant studies completed to date and  lasting 15 to 20 minutes of a 25 minute visit    I performed device teaching  using a teach back technique which also  extended face to face time for this visit (see above)    Each maintenance medication was reviewed in detail including most importantly the difference between maintenance and prns and under what circumstances the prns are to be triggered using an action plan format that is not reflected in the computer generated alphabetically organized AVS but trather by a customized med calendar that reflects the AVS meds with confirmed 100% correlation.   In addition, Please see AVS for unique instructions that I personally wrote and verbalized to the the pt in detail and then reviewed with pt  by my nurse highlighting any  changes in therapy recommended at today's visit to their plan of care.

## 2019-08-20 NOTE — Progress Notes (Signed)
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 Lymph node, biopsy, mediastinal - LYMPH NODE WITH METASTATIC ADENOCARCINOMA. - SEE MICROSCOPIC DESCRIPTION 2. Plaque, coronary artery - CALCIFIED ATHEROSCLEROTIC PLAQUE.   10/14/2018 Miscellaneous   Foundation One:  MSI stable tumor mutation burden 3Muts/mb ERBB2 amplification CCNE1 amplification TP53 mutation(+) CDK6 amplification  HGF amplification    11/13/2018 Cancer Staging   Staging form: Gallbladder, AJCC 8th Edition - Clinical stage from 11/13/2018: Stage IVB (cTX, cN2, pM1) - Signed by Truitt Merle, MD on 12/15/2018   11/17/2018 Imaging   11/17/2018 CT CAP IMPRESSION: 1. 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 This is associated with extensive upper abdominal and retroperitoneal lymphadenopathy, as well as metastatic lymphadenopathy in the posterior mediastinum and left supraclavicular region. There is also a metastatic lesion to the left ischium. 2. 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 3. Aortic atherosclerosis, in addition to left main and 3 vessel coronary artery disease. Status post median sternotomy for CABG including LIMA to the LAD. 4. There are calcifications of the aortic valve. Echocardiographic correlation for evaluation  of potential valvular dysfunction may be warranted if clinically indicated.    11/21/2018 Initial Diagnosis   Gallbladder cancer ([place])   11/27/2018 Pathology Results   11/27/2018 CA19-9 immunohistochemical stain Per request, a CA19-9 immunohistochemical stain was performed at an outside institution revealing positive staining in the tumor cells.    12/15/2018 -  Chemotherapy    first line chemo cisplatin and gemcitabine every 2 weeks on 12/15/18   02/20/2019 PET scan   Restaging PET scan:  IMPRESSION: 1. Positive response to therapy at all primary and metastatic sites. Persistent primary and metastatic lesions do have intense hypermetabolic activity albeit reduced. 2. Decrease in size and hypermetabolic activity of mass lesion in the gallbladder fundus. 3. Interval decrease in size and metabolic activity of periportal adenopathy. 4. Decrease in size and metabolic activity of periaortic lymph nodes. 5. Interval decrease in size and metabolic activity of destructive lesion in the LEFT inferior pubic ramus with new sclerosis. 6. No new or progressive disease.   05/29/2019 Imaging   CT CAP W Contrast  IMPRESSION: Known gallbladder adenocarcinoma, difficult to compare to recent PET, improved from prior CT.  Upper abdominal/retroperitoneal lymphadenopathy, slightly improved from prior PET.  Osseous metastasis involving the left inferior pubic ramus, grossly unchanged.  No evidence of metastatic disease in the chest.  Additional ancillary findings as above.   08/22/2019 Imaging   CT CAP W Contrast  IMPRESSION: 1. Stable soft tissue mass involving the gallbladder fundus. 2. Stable mild porta hepatis and retroperitoneal lymphadenopathy. 3. Stable sclerotic bone metastases. 4. No new or progressive metastatic disease identified. 5. Stable 2.3 cm left thyroid lobe nodule.      CURRENT THERAPY:  -First linechemo cisplatin and gemcitabineevery 2 weekson 12/15/18. Had 1  month chemo breakin April, restarted on 04/20/19 -Zometa q12weeks  starting 01/26/19  INTERVAL HISTORY:  Cassie HENDRIKS is here for a follow up and treatment. She presents to the clinic alone. She notes it took her a whole weak to recover with last chemo. She had significnat fatigue, diarrhea and dry heaves. She still feels her appetite is stable. She was able to maintain her wieght. She did use antiemetics still. She was able to still do everything for herself but she needed to take a break. She notes only 3 fingers have tingling since starting chemo. She notes she has a daughter that lives in [place].    REVIEW OF SYSTEMS:   Constitutional: Denies fevers, chills or abnormal weight loss (+) lower appetite, weight maintained  Eyes: Denies blurriness of vision Ears, nose, mouth, throat, and face: Denies mucositis or sore throat Respiratory: Denies cough, dyspnea or wheezes Cardiovascular: Denies palpitation, chest discomfort or lower extremity swelling Gastrointestinal:  Denies nausea, heartburn or change in bowel habits Skin: Denies abnormal skin rashes Lymphatics: Denies new lymphadenopathy or easy bruising Neurological: (+) Intermittent tingling of 3 fingers  Behavioral/Psych: Mood is stable, no new changes  All other systems were reviewed with the patient and are negative.  MEDICAL HISTORY:  Past Medical History:  Diagnosis Date   Allergic rhinitis    Arthritis    Asthma    xolair s 8/05 ?11/07; mastered hfa 12/20/08   Benign positional vertigo    CAD (coronary artery disease)    a. 09/2014 NSTEMI s/p LHC with sig 2V dz. dLAD diffusely diseased and not suitable for PCI. unsuccessful RCA PCI d/t heavy calcifications   gallbladder ca dx'd 10/2018   GERD (gastroesophageal reflux disease)    Heart attack ([place])    09/2014   Hyperlipidemia    <130 ldl pos fm hx, bp   Hypertension    Osteopenia    dexa 08/22/07 AP spine + 1.1, left femur -1.3, right femur -.8; dexa  10/06/09 +1.6, left femur =1.6, right femur -.   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 breast bx--for microcalcifications in rt. retroareolar region--dx was hyalinized fibroadenoma   BUNIONECTOMY Bilateral    CARDIAC CATHETERIZATION     2015   CATARACT EXTRACTION Right 02/02/2018   Dr Satira Sark   CATARACT EXTRACTION Left 03/30/2018   CERVICAL [place] SURGERY  12/01   CORONARY ARTERY BYPASS GRAFT N/A 10/04/2018   Procedure: CORONARY ARTERY BYPASS GRAFTING (CABG) times 2 using left  Internal mammary artery to LAD, left greater saphenous vein - open harvest.;  Surgeon: [name], MD;  Location: [place];  Service: Open Heart Surgery;  Laterality: N/A;   IR IMAGING GUIDED PORT INSERTION  12/08/2018   LEFT HEART CATH AND CORONARY ANGIOGRAPHY N/A 09/27/2018   Procedure: LEFT HEART CATH AND CORONARY ANGIOGRAPHY;  Surgeon: Troy Sine, MD;  Location: [facility];  Service: Cardiovascular;  Laterality: N/A;   LEFT HEART CATHETERIZATION WITH CORONARY ANGIOGRAM N/A 10/16/2014   Procedure: LEFT HEART CATHETERIZATION WITH CORONARY ANGIOGRAM;  Surgeon: Blane Ohara, MD;  Location[facility] CATH LAB;  Service: Cardiovascular;  Laterality: N/A;   Left L4-5 transforaminal lumbar interbody fusion with Depuy cage, rods and screws, local and allograft bone graft, Vivigen; bilateral decompression/partial hemilaminectomy lumbar five-sacral one  07/2017   PERCUTANEOUS CORONARY ROTOBLATOR INTERVENTION (PCI-R) N/A  10/17/2014   Procedure: PERCUTANEOUS CORONARY ROTOBLATOR INTERVENTION (PCI-R);  Surgeon: Troy Sine, MD;  Location: [facility] CATH LAB;  Service: Cardiovascular;  Laterality: N/A;   TEE WITHOUT CARDIOVERSION N/A 10/04/2018   Procedure: TRANSESOPHAGEAL ECHOCARDIOGRAM (TEE);   Surgeon: [name], MD;  Location: [place];  Service: Open Heart Surgery;  Laterality: N/A;   VEIN LIGATION AND STRIPPING Left    VIDEO BRONCHOSCOPY Bilateral 02/05/2015   Procedure: VIDEO BRONCHOSCOPY WITHOUT FLUORO;  Surgeon: Tanda Rockers, MD;  Location: [facility];  Service: Endoscopy;  Laterality: Bilateral;   VULVA /PERINEUM BIOPSY  12-30-10   --epidermoid cyst    I have reviewed the social history and family history with the patient and they are unchanged from previous note.  ALLERGIES:  is allergic to fish-derived products; penicillins; aspirin; brilinta [ticagrelor]; codeine phosphate; and diphenhydramine hcl.  MEDICATIONS:  Current Outpatient Medications  Medication Sig Dispense Refill   albuterol (PROAIR HFA) 108 (90 Base) MCG/ACT inhaler INHALE 1 TO 2 PUFFS BY MOUTH EVERY 4 HOURS AS NEEDED FOR WHEEZING 8.5 g 3   atorvastatin (LIPITOR) 80 MG tablet TAKE 1 TABLET(80 MG) BY MOUTH DAILY 90 tablet 1   bisoprolol (ZEBETA) 10 MG tablet Take 1 tablet (10 mg total) by mouth daily. 90 tablet 1   celecoxib (CELEBREX) 200 MG capsule Take 200 mg by mouth 2 (two) times daily as needed.     cetirizine (ZYRTEC) 10 MG tablet Take 10 mg by mouth at bedtime as needed for allergies.     cholecalciferol 2000 units TABS Take 1 tablet (2,000 Units total) by mouth daily. 30 tablet 3   clopidogrel (PLAVIX) 75 MG tablet Take 1 tablet (75 mg total) by mouth daily. 90 tablet 3   dextromethorphan-guaiFENesin (MUCINEX DM) 30-600 MG per 12 hr tablet Take 1-2 tablets by mouth every 12 (twelve) hours as needed for cough (with flutter).      famotidine (PEPCID) 20 MG tablet Take 20 mg by mouth as needed.      ferrous sulfate 325 (65 FE) MG tablet Take 650 mg by mouth daily with breakfast.      fluticasone (FLONASE) 50 MCG/ACT nasal spray Place 1-2 sprays into both nostrils 2 (two) times daily as needed for allergies or rhinitis.     HYDROcodone-acetaminophen (NORCO/VICODIN) 5-325 MG  tablet Take 1 tablet by mouth every 6 (six) hours as needed for moderate pain. 30 tablet 0   lidocaine-prilocaine (EMLA) cream Apply to affected area once 30 g 3   magic mouthwash SOLN Take 5 mLs by mouth 4 (four) times daily. Leave Benadryl out of compound since pt allergic to it. Use hydrocortisone & nystatin. Swish & swallow or spit for thrush 240 mL 1   magnesium oxide (MAGNESIUM-OXIDE) 400 (241.3 Mg) MG tablet Take 1 tablet (400 mg total) by mouth 2 (two) times daily. (Patient taking differently: Take 400 mg by mouth 3 (three) times daily. ) 60 tablet 1   meclizine (ANTIVERT) 25 MG tablet TAKE 2 TABLETS BY MOUTH THREE TIMES DAILY AS NEEDED 24 tablet 2   montelukast (SINGULAIR) 10 MG tablet TAKE 1 TABLET(10 MG) BY MOUTH AT BEDTIME 90 tablet 1   Multiple Vitamin (MULTIVITAMIN) capsule Take 1 capsule by mouth daily.      omeprazole (PRILOSEC) 20 MG capsule Take 20 mg by mouth as needed.      ondansetron (ZOFRAN) 8 MG tablet Take 1 tablet (8 mg total) by mouth 2 (two) times daily as needed. Start on the third day after chemotherapy.  30 tablet 1   potassium chloride SA (K-DUR) 20 MEQ tablet TAKE 1 TABLET(20 MEQ) BY MOUTH TWICE DAILY (Patient taking differently: Take 20 mEq by mouth daily. ) 60 tablet 2   predniSONE (DELTASONE) 10 MG tablet Take  4 each am x 2 days,   2 each am x 2 days,  1 each am x 2 days and stop 14 tablet 0   prochlorperazine (COMPAZINE) 10 MG tablet Take 1 tablet (10 mg total) by mouth every 6 (six) hours as needed (Nausea or vomiting). 30 tablet 1   SYMBICORT 160-4.5 MCG/ACT inhaler INHALE 2 PUFFS BY MOUTH EVERY 12 HOURS 10.2 g 11   dexamethasone (DECADRON) 4 MG tablet Take 1 tablet (4 mg total) by mouth 2 (two) times daily with a meal. 20 tablet 0   furosemide (LASIX) 20 MG tablet Take 2 tablets (40 mg total) by mouth daily. 180 tablet 3   No current facility-administered medications for this visit.    Facility-Administered Medications Ordered in Other Visits    Medication Dose Route Frequency Provider Last Rate Last Dose   heparin lock flush 100 unit/mL  500 Units Intracatheter Once PRN Truitt Merle, MD       sodium chloride flush (NS) 0.9 % injection 10 mL  10 mL Intracatheter PRN Truitt Merle, MD        PHYSICAL EXAMINATION: ECOG PERFORMANCE STATUS: 1 - Symptomatic but completely ambulatory  Vitals:   08/24/19 0817  BP: 125/75  Pulse: 80  Resp: 18  Temp: 98.3 F (36.8 C)  SpO2: 99%   Filed Weights   08/24/19 0817  Weight: 126 lb 6.4 oz (57.3 kg)    GENERAL:alert, no distress and comfortable SKIN: skin color, texture, turgor are normal, no rashes or significant lesions EYES: normal, Conjunctiva are pink and non-injected, sclera clear  NECK: supple, thyroid normal size, non-tender, without nodularity LYMPH:  no palpable lymphadenopathy in the cervical, axillary  LUNGS: clear to auscultation and percussion with normal breathing effort HEART: regular rate & rhythm and no murmurs and no lower extremity edema ABDOMEN:abdomen soft, non-tender and normal bowel sounds Musculoskeletal:no cyanosis of digits and no clubbing  NEURO: alert & oriented x 3 with fluent speech, no focal motor/sensory deficits  LABORATORY DATA:  I have reviewed the data as listed CBC Latest Ref Rng & Units 08/24/2019 08/10/2019 07/27/2019  WBC 4.0 - 10.5 K/uL 5.1 5.6 5.8  Hemoglobin 12.0 - 15.0 g/dL 9.6(L) 10.2(L) 10.7(L)  Hematocrit 36.0 - 46.0 % 29.6(L) 30.8(L) 33.3(L)  Platelets 150 - 400 K/uL 213 191 227     CMP Latest Ref Rng & Units 08/24/2019 08/10/2019 07/27/2019  Glucose 70 - 99 mg/dL 126(H) 91 94  BUN 8 - 23 mg/dL 17 12 12   Creatinine 0.44 - 1.00 mg/dL 1.12(H) 0.94 0.85  Sodium 135 - 145 mmol/L 140 139 139  Potassium 3.5 - 5.1 mmol/L 4.3 5.1 5.1  Chloride 98 - 111 mmol/L 105 103 106  CO2 22 - 32 mmol/L 21(L) 25 22  Calcium 8.9 - 10.3 mg/dL 8.8(L) 9.3 9.5  Total Protein 6.5 - 8.1 g/dL 8.1 8.2(H) 8.3(H)  Total Bilirubin 0.3 - 1.2 mg/dL 0.3 0.2(L) 0.2(L)   Alkaline Phos 38 - 126 U/L 84 86 95  AST 15 - 41 U/L 28 28 27   ALT 0 - 44 U/L 16 16 17       RADIOGRAPHIC STUDIES: I have personally reviewed the radiological images as listed and agreed with the findings in the report. Ct Chest W Contrast  Result Date: 08/22/2019 CLINICAL DATA:  Follow-up metastatic gallbladder carcinoma. Currently undergoing chemotherapy. Restaging. EXAM: CT CHEST, ABDOMEN, AND PELVIS WITH CONTRAST TECHNIQUE: Multidetector CT imaging of the chest, abdomen and pelvis was performed following the standard protocol during bolus administration of intravenous contrast. CONTRAST:  149m OMNIPAQUE IOHEXOL 300 MG/ML  SOLN COMPARISON:  05/29/2019 FINDINGS: CT CHEST FINDINGS Cardiovascular: No acute findings. Aortic and coronary artery atherosclerosis. Prior CABG again noted. Mediastinum/Lymph Nodes: No pathologically enlarged lymph nodes identified. 2.3 cm left thyroid lobe nodule shows no significant change compared to prior study. Lungs/Pleura: Stable chronic right middle lobe collapse. Stable mild right lung scarring. No pulmonary infiltrate or mass identified. No effusion present. Musculoskeletal:  No suspicious bone lesions identified. CT ABDOMEN AND PELVIS FINDINGS Hepatobiliary: Soft tissue mass is again seen involving the gallbladder fundus which measures 3.5 x 2.7 cm and shows no significant change compared to previous study. No intrahepatic masses identified. No evidence of biliary ductal dilatation. Pancreas:  No mass or inflammatory changes. Spleen:  Within normal limits in size and appearance. Adrenals/Urinary tract:  No masses or hydronephrosis. Stomach/Bowel: No evidence of obstruction, inflammatory process, or abnormal fluid collections. Vascular/Lymphatic: Mild lymphadenopathy is again seen in the porta hepatis, with largest lymph node in the portacaval space measuring 17 mm and is also unchanged, with index lymph node in left paraaortic region measuring 14 mm common iliac  lymphadenopathy is also unchanged. No new or increased areas of lymphadenopathy identified. Aortic atherosclerosis. No abdominal aortic aneurysm. Reproductive: A few tiny uterine fibroids are again seen, some which show partial calcification. No adnexal masses identified. Other:  None. Musculoskeletal: Stable appearance of sclerotic metastasis involving the left ischium. Stable small sclerotic bone lesion in the T9 vertebral body is also stable. No new or progressive bone lesions identified. IMPRESSION: 1. Stable soft tissue mass involving the gallbladder fundus. 2. Stable mild porta hepatis and retroperitoneal lymphadenopathy. 3. Stable sclerotic bone metastases. 4. No new or progressive metastatic disease identified. 5. Stable 2.3 cm left thyroid lobe nodule. Aortic Atherosclerosis (ICD10-I70.0). Coronary artery atherosclerosis. Electronically Signed   By: JMarlaine HindM.D.   On: 08/22/2019 13:19   Ct Abdomen Pelvis W Contrast  Result Date: 08/22/2019 CLINICAL DATA:  Follow-up metastatic gallbladder carcinoma. Currently undergoing chemotherapy. Restaging. EXAM: CT CHEST, ABDOMEN, AND PELVIS WITH CONTRAST TECHNIQUE: Multidetector CT imaging of the chest, abdomen and pelvis was performed following the standard protocol during bolus administration of intravenous contrast. CONTRAST:  1027mOMNIPAQUE IOHEXOL 300 MG/ML  SOLN COMPARISON:  05/29/2019 FINDINGS: CT CHEST FINDINGS Cardiovascular: No acute findings. Aortic and coronary artery atherosclerosis. Prior CABG again noted. Mediastinum/Lymph Nodes: No pathologically enlarged lymph nodes identified. 2.3 cm left thyroid lobe nodule shows no significant change compared to prior study. Lungs/Pleura: Stable chronic right middle lobe collapse. Stable mild right lung scarring. No pulmonary infiltrate or mass identified. No effusion present. Musculoskeletal:  No suspicious bone lesions identified. CT ABDOMEN AND PELVIS FINDINGS Hepatobiliary: Soft tissue mass is again  seen involving the gallbladder fundus which measures 3.5 x 2.7 cm and shows no significant change compared to previous study. No intrahepatic masses identified. No evidence of biliary ductal dilatation. Pancreas:  No mass or inflammatory changes. Spleen:  Within normal limits in size and appearance. Adrenals/Urinary tract:  No masses or hydronephrosis. Stomach/Bowel: No evidence of obstruction, inflammatory process, or abnormal fluid collections. Vascular/Lymphatic: Mild lymphadenopathy is again seen in the porta hepatis, with largest lymph node in the portacaval space measuring 17 mm and is also unchanged, with index lymph  node in left paraaortic region measuring 14 mm common iliac lymphadenopathy is also unchanged. No new or increased areas of lymphadenopathy identified. Aortic atherosclerosis. No abdominal aortic aneurysm. Reproductive: A few tiny uterine fibroids are again seen, some which show partial calcification. No adnexal masses identified. Other:  None. Musculoskeletal: Stable appearance of sclerotic metastasis involving the left ischium. Stable small sclerotic bone lesion in the T9 vertebral body is also stable. No new or progressive bone lesions identified. IMPRESSION: 1. Stable soft tissue mass involving the gallbladder fundus. 2. Stable mild porta hepatis and retroperitoneal lymphadenopathy. 3. Stable sclerotic bone metastases. 4. No new or progressive metastatic disease identified. 5. Stable 2.3 cm left thyroid lobe nodule. Aortic Atherosclerosis (ICD10-I70.0). Coronary artery atherosclerosis. Electronically Signed   By: Marlaine Hind M.D.   On: 08/22/2019 13:19     ASSESSMENT & PLAN:  Cassie Campbell is a 78 y.o. 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 Tolerating well. -[employer] testing results showed MSI stable disease, low tumor mutation burden,notargetable mutation such asFGFR fusion or IDH mutation, but (+) HER2 amplification, may try Herceptin in the future. -She is overall responding well tocurrentregimen. I discussedwe plan to continue until no longer tolerable or she has disease progression. She is fine to take chemo breakas needed.  -I discussed the longer she is on chemo and the more lines of treatment given the her disease response lessens. -I personally reviewed and discussed her CT CAP from 08/22/19 which shows stable disease. Her chemo is still controlling her disease currently and will continue current regimen.  -Her tumor marker is still trending up and may indicate early resistance. May have to change her treatment after next scan. She understands. We discussed the option of change her current cisplatin/gem to weekly 2 weeks on and one week off, she will think about it.  -S/p cycle 9 her side effects have accumulated and experienced significant fatigue, diarrhea, nausea and mild SOB. I will reduce her chemo dose before considering increasing her to 2 weeks on/1 week off.  -For her nausea I will call in Dexamethasone 15m to take once in the AM for 3 days after chemo infusion.  -Labs reviewed, CBC and CMP WNL except Hg 9.6, Cr 1.12. Mag 1.6. Overall adequate to proceed with cycle 10 Gem/Cis with dose reduction today and Gem/Cis todayand continue every 2 weeks -f/uin2 weeks. I encouraged her to contact clinic if she develops significant or unexpected side effects.  2. CAD, S/P CABGX2 on 09/27/2018, EF 40-45% -Currently on Plavix. She also takes lasix during chemo. -f/u with Dr. KClaiborne Billings-stable,she appears to be euvolemic  3. Asthma -Currently on Symbicort, Singulair, and albuterol as needed -f/u with pulmonary, stable, no recent flare  4.Goal of care discussion -The patient understands  the goal of care is palliative. -she is full code for now  5.Hypokalemiaand hypomagnesemia -Likely secondary to lasix. Currently on KCL 237mdaily and mag-ox 40012md  -Due topersistent hypomagnesemia she is on oral mag TID -K 4.3 and Mag at1.4 today (08/24/19). Will increase Mg from 3g to 5g in hydration fluids   6. Mild Anemia -Secondary to chemoand her recent open heart surgery -Hg at9.6today (08/24/19).  -Oral iron is causing her bloating so she takes it every other day. I will check iron study and if  adequate, will stop oral iron. -I discussed her anemia may contribute to her fatigue and mild SOB.   7. Chronic left upper leg pain -likely musculoskeletal at this point and related to prior surgeries. Tolerable, She is still functional -She uses Celebrex and Tylenol.  -I encouraged her to discuss Celebrex use with her cardiologist. She can continue Tylenol for now.   8. Acid Reflux -Not controlled on Pepcid. I recommend she try Prilosec which is stronger. She is willing to try   Plan -I called in dexamethasone today (will not do it for this cycle since her PCP prescribed prednisone for her SOB)  -Labs reviewed and adequate to proceed with gem/Cis todayat reduced dose due to her poor tolerance last cycle  -continue Zometa every 3 months, next in 10/2019 -lab, flush, gem/Cis in 2 weeks with lab. May change her chemo to weekly 2 weeks on and 1 week off if she tolerates from next cycle    No problem-specific Assessment & Plan notes found for this encounter.   No orders of the defined types were placed in this encounter.  All questions were answered. The patient knows to call the clinic with any problems, questions or concerns. No barriers to learning was detected. I spent 25 minutes counseling the patient face to face. The total time spent in the appointment was 30 minutes and more than 50% was on counseling and review of test results     Truitt Merle, MD 08/24/2019     I, Joslyn Devon, am acting as scribe for Truitt Merle, MD.   I have reviewed the above documentation for accuracy and completeness, and I agree with the above.

## 2019-08-22 ENCOUNTER — Ambulatory Visit (HOSPITAL_COMMUNITY)
Admission: RE | Admit: 2019-08-22 | Discharge: 2019-08-22 | Disposition: A | Discharge status: Home / Self Care | Payer: Medicare Other | Source: Ambulatory Visit | Attending: Hematology | Admitting: Hematology

## 2019-08-22 ENCOUNTER — Encounter (HOSPITAL_COMMUNITY): Payer: Self-pay

## 2019-08-22 ENCOUNTER — Other Ambulatory Visit: Payer: Self-pay

## 2019-08-22 DIAGNOSIS — C23 Malignant neoplasm of gallbladder: Secondary | ICD-10-CM | POA: Diagnosis present

## 2019-08-22 HISTORY — DX: Malignant (primary) neoplasm, unspecified: C80.1

## 2019-08-22 MED ORDER — SODIUM CHLORIDE (PF) 0.9 % IJ SOLN
INTRAMUSCULAR | Status: AC
  Filled 2019-08-22: qty 50

## 2019-08-22 MED ORDER — IOHEXOL 300 MG/ML  SOLN
100.0000 mL | Freq: Once | INTRAMUSCULAR | Status: AC | PRN
  Administered 2019-08-22: 100 mL via INTRAVENOUS

## 2019-08-22 MED ORDER — HEPARIN SOD (PORK) LOCK FLUSH 100 UNIT/ML IV SOLN: 500.0000 [IU] | Freq: Once | INTRAVENOUS | Status: DC

## 2019-08-22 MED ORDER — HEPARIN SOD (PORK) LOCK FLUSH 100 UNIT/ML IV SOLN
INTRAVENOUS | Status: AC
  Filled 2019-08-22: qty 5

## 2019-08-24 ENCOUNTER — Telehealth: Payer: Self-pay | Admitting: Hematology

## 2019-08-24 ENCOUNTER — Other Ambulatory Visit: Payer: Self-pay

## 2019-08-24 ENCOUNTER — Inpatient Hospital Stay: Payer: Medicare Other

## 2019-08-24 ENCOUNTER — Encounter: Payer: Self-pay | Admitting: Hematology

## 2019-08-24 ENCOUNTER — Inpatient Hospital Stay: Payer: Medicare Other | Attending: Obstetrics | Admitting: Hematology

## 2019-08-24 VITALS — BP 125/75 | HR 80 | Temp 98.3°F | Resp 18 | Ht 59.0 in | Wt 126.4 lb

## 2019-08-24 DIAGNOSIS — K219 Gastro-esophageal reflux disease without esophagitis: Secondary | ICD-10-CM | POA: Insufficient documentation

## 2019-08-24 DIAGNOSIS — D649 Anemia, unspecified: Secondary | ICD-10-CM | POA: Diagnosis not present

## 2019-08-24 DIAGNOSIS — J45909 Unspecified asthma, uncomplicated: Secondary | ICD-10-CM | POA: Insufficient documentation

## 2019-08-24 DIAGNOSIS — C23 Malignant neoplasm of gallbladder: Secondary | ICD-10-CM | POA: Diagnosis present

## 2019-08-24 DIAGNOSIS — Z5111 Encounter for antineoplastic chemotherapy: Secondary | ICD-10-CM | POA: Insufficient documentation

## 2019-08-24 DIAGNOSIS — E876 Hypokalemia: Secondary | ICD-10-CM | POA: Diagnosis not present

## 2019-08-24 DIAGNOSIS — R110 Nausea: Secondary | ICD-10-CM | POA: Diagnosis not present

## 2019-08-24 DIAGNOSIS — Z7189 Other specified counseling: Secondary | ICD-10-CM

## 2019-08-24 DIAGNOSIS — C771 Secondary and unspecified malignant neoplasm of intrathoracic lymph nodes: Secondary | ICD-10-CM | POA: Diagnosis not present

## 2019-08-24 DIAGNOSIS — C7951 Secondary malignant neoplasm of bone: Secondary | ICD-10-CM | POA: Diagnosis not present

## 2019-08-24 DIAGNOSIS — R197 Diarrhea, unspecified: Secondary | ICD-10-CM | POA: Diagnosis not present

## 2019-08-24 DIAGNOSIS — M79652 Pain in left thigh: Secondary | ICD-10-CM | POA: Insufficient documentation

## 2019-08-24 DIAGNOSIS — I2510 Atherosclerotic heart disease of native coronary artery without angina pectoris: Secondary | ICD-10-CM | POA: Diagnosis not present

## 2019-08-24 LAB — CMP (CANCER CENTER ONLY)
ALT: 16 U/L (ref 0–44)
AST: 28 U/L (ref 15–41)
Albumin: 3.4 g/dL — ABNORMAL LOW (ref 3.5–5.0)
Alkaline Phosphatase: 84 U/L (ref 38–126)
Anion gap: 14 (ref 5–15)
BUN: 17 mg/dL (ref 8–23)
CO2: 21 mmol/L — ABNORMAL LOW (ref 22–32)
Calcium: 8.8 mg/dL — ABNORMAL LOW (ref 8.9–10.3)
Chloride: 105 mmol/L (ref 98–111)
Creatinine: 1.12 mg/dL — ABNORMAL HIGH (ref 0.44–1.00)
GFR, Est AFR Am: 55 mL/min — ABNORMAL LOW (ref >60)
GFR, Estimated: 48 mL/min — ABNORMAL LOW (ref >60)
Glucose, Bld: 126 mg/dL — ABNORMAL HIGH (ref 70–99)
Potassium: 4.3 mmol/L (ref 3.5–5.1)
Sodium: 140 mmol/L (ref 135–145)
Total Bilirubin: 0.3 mg/dL (ref 0.3–1.2)
Total Protein: 8.1 g/dL (ref 6.5–8.1)

## 2019-08-24 LAB — CBC WITH DIFFERENTIAL (CANCER CENTER ONLY)
Abs Immature Granulocytes: 0.02 10*3/uL (ref 0.00–0.07)
Basophils Absolute: 0 10*3/uL (ref 0.0–0.1)
Basophils Relative: 0 %
Eosinophils Absolute: 0.3 10*3/uL (ref 0.0–0.5)
Eosinophils Relative: 7 %
HCT: 29.6 % — ABNORMAL LOW (ref 36.0–46.0)
Hemoglobin: 9.6 g/dL — ABNORMAL LOW (ref 12.0–15.0)
Immature Granulocytes: 0 %
Lymphocytes Relative: 16 %
Lymphs Abs: 0.8 10*3/uL (ref 0.7–4.0)
MCH: 33.6 pg (ref 26.0–34.0)
MCHC: 32.4 g/dL (ref 30.0–36.0)
MCV: 103.5 fL — ABNORMAL HIGH (ref 80.0–100.0)
Monocytes Absolute: 0.8 10*3/uL (ref 0.1–1.0)
Monocytes Relative: 15 %
Neutro Abs: 3.2 10*3/uL (ref 1.7–7.7)
Neutrophils Relative %: 62 %
Platelet Count: 213 10*3/uL (ref 150–400)
RBC: 2.86 MIL/uL — ABNORMAL LOW (ref 3.87–5.11)
RDW: 19.2 % — ABNORMAL HIGH (ref 11.5–15.5)
WBC Count: 5.1 10*3/uL (ref 4.0–10.5)
nRBC: 0 % (ref 0.0–0.2)

## 2019-08-24 LAB — MAGNESIUM: Magnesium: 1.6 mg/dL — ABNORMAL LOW (ref 1.7–2.4)

## 2019-08-24 MED ORDER — DEXAMETHASONE 4 MG PO TABS: 4.0000 mg | ORAL_TABLET | Freq: Two times a day (BID) | ORAL | 0 refills | Status: DC

## 2019-08-24 MED ORDER — SODIUM CHLORIDE 0.9 % IV SOLN
Freq: Once | INTRAVENOUS | Status: AC
  Administered 2019-08-24: 09:00:00 via INTRAVENOUS
  Filled 2019-08-24: qty 250

## 2019-08-24 MED ORDER — PALONOSETRON HCL INJECTION 0.25 MG/5ML
0.2500 mg | Freq: Once | INTRAVENOUS | Status: AC
  Administered 2019-08-24: 0.25 mg via INTRAVENOUS

## 2019-08-24 MED ORDER — POTASSIUM CHLORIDE 2 MEQ/ML IV SOLN
Freq: Once | INTRAVENOUS | Status: AC
  Administered 2019-08-24: 09:00:00 via INTRAVENOUS
  Filled 2019-08-24: qty 1000

## 2019-08-24 MED ORDER — SODIUM CHLORIDE 0.9% FLUSH
10.0000 mL | Freq: Once | INTRAVENOUS | Status: AC | PRN
  Administered 2019-08-24: 08:00:00 10 mL
  Filled 2019-08-24: qty 10

## 2019-08-24 MED ORDER — HEPARIN SOD (PORK) LOCK FLUSH 100 UNIT/ML IV SOLN
500.0000 [IU] | Freq: Once | INTRAVENOUS | Status: AC | PRN
  Administered 2019-08-24: 500 [IU]
  Filled 2019-08-24: qty 5

## 2019-08-24 MED ORDER — PALONOSETRON HCL INJECTION 0.25 MG/5ML
INTRAVENOUS | Status: AC
  Filled 2019-08-24: qty 5

## 2019-08-24 MED ORDER — SODIUM CHLORIDE 0.9 % IV SOLN
800.0000 mg/m2 | Freq: Once | INTRAVENOUS | Status: AC
  Administered 2019-08-24: 13:00:00 1216 mg via INTRAVENOUS
  Filled 2019-08-24: qty 31.98

## 2019-08-24 MED ORDER — HEPARIN SOD (PORK) LOCK FLUSH 100 UNIT/ML IV SOLN
500.0000 [IU] | Freq: Once | INTRAVENOUS | Status: DC | PRN
  Filled 2019-08-24: qty 5

## 2019-08-24 MED ORDER — FUROSEMIDE 10 MG/ML IJ SOLN
INTRAMUSCULAR | Status: AC
  Filled 2019-08-24: qty 2

## 2019-08-24 MED ORDER — FUROSEMIDE 10 MG/ML IJ SOLN
20.0000 mg | Freq: Once | INTRAMUSCULAR | Status: AC
  Administered 2019-08-24: 20 mg via INTRAVENOUS

## 2019-08-24 MED ORDER — SODIUM CHLORIDE 0.9 % IV SOLN
Freq: Once | INTRAVENOUS | Status: AC
  Administered 2019-08-24: 12:00:00 via INTRAVENOUS
  Filled 2019-08-24: qty 5

## 2019-08-24 MED ORDER — SODIUM CHLORIDE 0.9 % IV SOLN
Freq: Once | INTRAVENOUS | Status: AC
  Administered 2019-08-24: 11:00:00 via INTRAVENOUS
  Filled 2019-08-24: qty 100

## 2019-08-24 MED ORDER — SODIUM CHLORIDE 0.9% FLUSH
10.0000 mL | INTRAVENOUS | Status: DC | PRN
  Administered 2019-08-24: 10 mL
  Filled 2019-08-24: qty 10

## 2019-08-24 MED ORDER — SODIUM CHLORIDE 0.9 % IV SOLN
20.0000 mg/m2 | Freq: Once | INTRAVENOUS | Status: AC
  Administered 2019-08-24: 30 mg via INTRAVENOUS
  Filled 2019-08-24: qty 30

## 2019-08-24 NOTE — Progress Notes (Signed)
MD requested Mag Sulfate 1gm/NS 114mL infused over 30 minutes to be added today. IVF with Mag Sulfate 3gms already hanging. May run  1 gm prior to chemo or after completion of large volume IVF. MD requested future IVF orders include Mag sulfate 5gms. Orders added as requested.  Hardie Pulley, PharmD, BCPS, BCOP

## 2019-08-24 NOTE — Patient Instructions (Signed)
[facility] Discharge Instructions for Patients Receiving Chemotherapy  Today you received the following chemotherapy agents Gemcitabine (GEMZAR) & Cisplatin (PLATINOL).  To help prevent nausea and vomiting after your treatment, we encourage you to take your nausea medication as prescribed.   If you develop nausea and vomiting that is not controlled by your nausea medication, call the clinic.   BELOW ARE SYMPTOMS THAT SHOULD BE REPORTED IMMEDIATELY:  *FEVER GREATER THAN 100.5 F  *CHILLS WITH OR WITHOUT FEVER  NAUSEA AND VOMITING THAT IS NOT CONTROLLED WITH YOUR NAUSEA MEDICATION  *UNUSUAL SHORTNESS OF BREATH  *UNUSUAL BRUISING OR BLEEDING  TENDERNESS IN MOUTH AND THROAT WITH OR WITHOUT PRESENCE OF ULCERS  *URINARY PROBLEMS  *BOWEL PROBLEMS  UNUSUAL RASH Items with * indicate a potential emergency and should be followed up as soon as possible.  Feel free to call the clinic should you have any questions or concerns. The clinic phone number is [phone].  Please show the CHEMO ALERT CARD at check-in to the Emergency Department and triage nurse.  Coronavirus (COVID-19) Are you at risk?  Are you at risk for the Coronavirus (COVID-19)?  To be considered HIGH RISK for Coronavirus (COVID-19), you have to meet the following criteria:  . Traveled to [place], [place], [place], [place] or [place]; or in the [place] to [place], [place], [place], or [place]; and have fever, cough, and shortness of breath within the last 2 weeks of travel OR . Been in close contact with a person diagnosed with COVID-19 within the last 2 weeks and have fever, cough, and shortness of breath . IF YOU DO NOT MEET THESE CRITERIA, YOU ARE CONSIDERED LOW RISK FOR COVID-19.  What to do if you are HIGH RISK for COVID-19?  . If you are having a medical emergency, call 911. . Seek medical care right away. Before you go to a doctor's office, urgent care or emergency department, call  ahead and tell them about your recent travel, contact with someone diagnosed with COVID-19, and your symptoms. You should receive instructions from your physician's office regarding next steps of care.  . When you arrive at healthcare provider, tell the healthcare staff immediately you have returned from visiting [place], [place], [place], [place] or [place]; or traveled in the [place] to [place], [place], [place], or [place]; in the last two weeks or you have been in close contact with a person diagnosed with COVID-19 in the last 2 weeks.   . Tell the health care staff about your symptoms: fever, cough and shortness of breath. . After you have been seen by a medical provider, you will be either: o Tested for (COVID-19) and discharged home on quarantine except to seek medical care if symptoms worsen, and asked to  - Stay home and avoid contact with others until you get your results (4-5 days)  - Avoid travel on public transportation if possible (such as bus, train, or airplane) or o Sent to the Emergency Department by EMS for evaluation, COVID-19 testing, and possible admission depending on your condition and test results.  What to do if you are LOW RISK for COVID-19?  Reduce your risk of any infection by using the same precautions used for avoiding the common cold or flu:  . Wash your hands often with soap and warm water for at least 20 seconds.  If soap and water are not readily available, use an alcohol-based hand sanitizer with at least 60% alcohol.  .   If coughing or sneezing, cover your mouth and nose by coughing or sneezing into the elbow areas of your shirt or coat, into a tissue or into your sleeve (not your hands). . Avoid shaking hands with others and consider head nods or verbal greetings only. . Avoid touching your eyes, nose, or mouth with unwashed hands.  . Avoid close contact with people who are sick. . Avoid places or events with large numbers of people in one location,  like concerts or sporting events. . Carefully consider travel plans you have or are making. . If you are planning any travel outside or inside the [place], visit the CDC's Travelers' Health webpage for the latest health notices. . If you have some symptoms but not all symptoms, continue to monitor at home and seek medical attention if your symptoms worsen. . If you are having a medical emergency, call 911.   ADDITIONAL HEALTHCARE OPTIONS FOR PATIENTS  [facility] / e-Visit: [email or web address]         [facility]: [phone]  [facility]: [phone]                   [facility]: [phone]    

## 2019-08-24 NOTE — Telephone Encounter (Signed)
Scheduled appt per 9/4 los.   Added MD visit to see patient on 9/18  In the treatment area.  Patient start date did not change.

## 2019-09-03 ENCOUNTER — Other Ambulatory Visit: Payer: Self-pay | Admitting: Hematology

## 2019-09-03 NOTE — Progress Notes (Signed)
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 Lymph node, biopsy, mediastinal - LYMPH NODE WITH METASTATIC ADENOCARCINOMA. - SEE MICROSCOPIC DESCRIPTION 2. Plaque, coronary artery - CALCIFIED ATHEROSCLEROTIC PLAQUE.   10/14/2018 Miscellaneous   Foundation One:  MSI stable tumor mutation burden 3Muts/mb ERBB2 amplification CCNE1 amplification TP53 mutation(+) CDK6 amplification  HGF amplification    11/13/2018 Cancer Staging   Staging form: Gallbladder, AJCC 8th Edition - Clinical stage from 11/13/2018: Stage IVB (cTX, cN2, pM1) - Signed by Truitt Merle, MD on 12/15/2018   11/17/2018 Imaging   11/17/2018 CT CAP IMPRESSION: 1. 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 This is associated with extensive upper abdominal and retroperitoneal lymphadenopathy, as well as metastatic lymphadenopathy in the posterior mediastinum and left supraclavicular region. There is also a metastatic lesion to the left ischium. 2. 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 3. Aortic atherosclerosis, in addition to left main and 3 vessel coronary artery disease. Status post median sternotomy for CABG including LIMA to the LAD. 4. There are calcifications of the aortic valve. Echocardiographic correlation for evaluation  of potential valvular dysfunction may be warranted if clinically indicated.    11/21/2018 Initial Diagnosis   Gallbladder cancer ([place])   11/27/2018 Pathology Results   11/27/2018 CA19-9 immunohistochemical stain Per request, a CA19-9 immunohistochemical stain was performed at an outside institution revealing positive staining in the tumor cells.    12/15/2018 -  Chemotherapy    first line chemo cisplatin and gemcitabine every 2 weeks on 12/15/18   02/20/2019 PET scan   Restaging PET scan:  IMPRESSION: 1. Positive response to therapy at all primary and metastatic sites. Persistent primary and metastatic lesions do have intense hypermetabolic activity albeit reduced. 2. Decrease in size and hypermetabolic activity of mass lesion in the gallbladder fundus. 3. Interval decrease in size and metabolic activity of periportal adenopathy. 4. Decrease in size and metabolic activity of periaortic lymph nodes. 5. Interval decrease in size and metabolic activity of destructive lesion in the LEFT inferior pubic ramus with new sclerosis. 6. No new or progressive disease.   05/29/2019 Imaging   CT CAP W Contrast  IMPRESSION: Known gallbladder adenocarcinoma, difficult to compare to recent PET, improved from prior CT.  Upper abdominal/retroperitoneal lymphadenopathy, slightly improved from prior PET.  Osseous metastasis involving the left inferior pubic ramus, grossly unchanged.  No evidence of metastatic disease in the chest.  Additional ancillary findings as above.   08/22/2019 Imaging   CT CAP W Contrast  IMPRESSION: 1. Stable soft tissue mass involving the gallbladder fundus. 2. Stable mild porta hepatis and retroperitoneal lymphadenopathy. 3. Stable sclerotic bone metastases. 4. No new or progressive metastatic disease identified. 5. Stable 2.3 cm left thyroid lobe nodule.      CURRENT THERAPY:  -First linechemo cisplatin and gemcitabineevery 2 weekson 12/15/18. Had 1  month chemo breakin April, restarted on 04/20/19 -Zometa q12weeks  starting 01/26/19  INTERVAL HISTORY:  Cassie Campbell is here for a follow up and treatment. She presents to the clinic alone. She notes she felt better with last cycle. She only had toes numbness for a few days and mild diarrhea for 2 days. She took prednisone and her nausea was helped. She feels her breathing is adequate but gets tired quickly. She notes she completed prednisone from her SOB. She is still able to take care of herself adequately. She feels she can do every 2 weeks treatment, but not anything sooner. She feels her appetite is low the last few days. She only lost a pound.    REVIEW OF SYSTEMS:   Constitutional: Denies fevers, chills (+) Lower appetite, weight mostly stable (+) fatigue  Eyes: Denies blurriness of vision Ears, nose, mouth, throat, and face: Denies mucositis or sore throat Respiratory: Denies cough, dyspnea or wheezes (+) Improved breathing  Cardiovascular: Denies palpitation, chest discomfort or lower extremity swelling Gastrointestinal:  Denies nausea, heartburn or change in bowel habits Skin: Denies abnormal skin rashes Lymphatics: Denies new lymphadenopathy or easy bruising Neurological:Denies numbness, tingling or new weaknesses Behavioral/Psych: Mood is stable, no new changes  All other systems were reviewed with the patient and are negative.  MEDICAL HISTORY:  Past Medical History:  Diagnosis Date   Allergic rhinitis    Arthritis    Asthma    xolair s 8/05 ?11/07; mastered hfa 12/20/08   Benign positional vertigo    CAD (coronary artery disease)    a. 09/2014 NSTEMI s/p LHC with sig 2V dz. dLAD diffusely diseased and not suitable for PCI. unsuccessful RCA PCI d/t heavy calcifications   gallbladder ca dx'd 10/2018   GERD (gastroesophageal reflux disease)    Heart attack ([place])    09/2014   Hyperlipidemia    <130 ldl pos fm hx, bp   Hypertension    Osteopenia    dexa  08/22/07 AP spine + 1.1, left femur -1.3, right femur -.8; dexa 10/06/09 +1.6, left femur =1.6, right femur -.   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 breast bx--for microcalcifications in rt. retroareolar region--dx was hyalinized fibroadenoma   BUNIONECTOMY Bilateral    CARDIAC CATHETERIZATION     2015   CATARACT EXTRACTION Right 02/02/2018   Dr Satira Sark   CATARACT EXTRACTION Left 03/30/2018   CERVICAL [place] SURGERY  12/01   CORONARY ARTERY BYPASS GRAFT N/A 10/04/2018   Procedure: CORONARY ARTERY BYPASS GRAFTING (CABG) times 2 using left  Internal mammary artery to LAD, left greater saphenous vein - open harvest.;  Surgeon: [name], MD;  Location: [facility];  Service: Open Heart Surgery;  Laterality: N/A;   IR IMAGING GUIDED PORT INSERTION  12/08/2018   LEFT HEART CATH AND CORONARY ANGIOGRAPHY N/A 09/27/2018   Procedure: LEFT HEART CATH AND CORONARY ANGIOGRAPHY;  Surgeon: Troy Sine, MD;  Location: [facility];  Service: Cardiovascular;  Laterality: N/A;   LEFT HEART CATHETERIZATION WITH CORONARY ANGIOGRAM N/A 10/16/2014   Procedure: LEFT HEART CATHETERIZATION WITH CORONARY ANGIOGRAM;  Surgeon: Blane Ohara, MD;  Location: [facility] CATH LAB;  Service: Cardiovascular;  Laterality: N/A;   Left L4-5 transforaminal lumbar interbody fusion with Depuy cage, rods and screws, local and allograft bone graft, Vivigen; bilateral decompression/partial hemilaminectomy lumbar  five-sacral one  07/2017   PERCUTANEOUS CORONARY ROTOBLATOR INTERVENTION (PCI-R) N/A 10/17/2014   Procedure: PERCUTANEOUS CORONARY ROTOBLATOR INTERVENTION (PCI-R);  Surgeon: Troy Sine, MD;  Location: [facility] CATH LAB;  Service: Cardiovascular;  Laterality: N/A;   TEE WITHOUT CARDIOVERSION N/A  10/04/2018   Procedure: TRANSESOPHAGEAL ECHOCARDIOGRAM (TEE);  Surgeon: [name], MD;  Location: [facility];  Service: Open Heart Surgery;  Laterality: N/A;   VEIN LIGATION AND STRIPPING Left    VIDEO BRONCHOSCOPY Bilateral 02/05/2015   Procedure: VIDEO BRONCHOSCOPY WITHOUT FLUORO;  Surgeon: Tanda Rockers, MD;  Location: [facility];  Service: Endoscopy;  Laterality: Bilateral;   VULVA /PERINEUM BIOPSY  12-30-10   --epidermoid cyst    I have reviewed the social history and family history with the patient and they are unchanged from previous note.  ALLERGIES:  is allergic to fish-derived products; penicillins; aspirin; brilinta [ticagrelor]; codeine phosphate; and diphenhydramine hcl.  MEDICATIONS:  Current Outpatient Medications  Medication Sig Dispense Refill   albuterol (PROAIR HFA) 108 (90 Base) MCG/ACT inhaler INHALE 1 TO 2 PUFFS BY MOUTH EVERY 4 HOURS AS NEEDED FOR WHEEZING 8.5 g 3   atorvastatin (LIPITOR) 80 MG tablet TAKE 1 TABLET(80 MG) BY MOUTH DAILY 90 tablet 1   bisoprolol (ZEBETA) 10 MG tablet Take 1 tablet (10 mg total) by mouth daily. 90 tablet 1   celecoxib (CELEBREX) 200 MG capsule Take 200 mg by mouth 2 (two) times daily as needed.     cetirizine (ZYRTEC) 10 MG tablet Take 10 mg by mouth at bedtime as needed for allergies.     cholecalciferol 2000 units TABS Take 1 tablet (2,000 Units total) by mouth daily. 30 tablet 3   clopidogrel (PLAVIX) 75 MG tablet Take 1 tablet (75 mg total) by mouth daily. 90 tablet 3   dexamethasone (DECADRON) 4 MG tablet Take 1 tablet (4 mg total) by mouth 2 (two) times daily with a meal. 20 tablet 0   dextromethorphan-guaiFENesin (MUCINEX DM) 30-600 MG per 12 hr tablet Take 1-2 tablets by mouth every 12 (twelve) hours as needed for cough (with flutter).      famotidine (PEPCID) 20 MG tablet Take 20 mg by mouth as needed.      ferrous sulfate 325 (65 FE) MG tablet Take 650 mg by mouth daily with breakfast.      fluticasone  (FLONASE) 50 MCG/ACT nasal spray Place 1-2 sprays into both nostrils 2 (two) times daily as needed for allergies or rhinitis.     furosemide (LASIX) 20 MG tablet Take 2 tablets (40 mg total) by mouth daily. 180 tablet 3   HYDROcodone-acetaminophen (NORCO/VICODIN) 5-325 MG tablet Take 1 tablet by mouth every 6 (six) hours as needed for moderate pain. 30 tablet 0   lidocaine-prilocaine (EMLA) cream Apply to affected area once 30 g 3   magic mouthwash SOLN Take 5 mLs by mouth 4 (four) times daily. Leave Benadryl out of compound since pt allergic to it. Use hydrocortisone & nystatin. Swish & swallow or spit for thrush 240 mL 1   magnesium oxide (MAGNESIUM-OXIDE) 400 (241.3 Mg) MG tablet Take 1 tablet (400 mg total) by mouth 2 (two) times daily. (Patient taking differently: Take 400 mg by mouth 3 (three) times daily. ) 60 tablet 1   meclizine (ANTIVERT) 25 MG tablet TAKE 2 TABLETS BY MOUTH THREE TIMES DAILY AS NEEDED 24 tablet 2   montelukast (SINGULAIR) 10 MG tablet TAKE 1 TABLET(10 MG) BY MOUTH AT BEDTIME 90 tablet 1   Multiple Vitamin (  MULTIVITAMIN) capsule Take 1 capsule by mouth daily.      omeprazole (PRILOSEC) 20 MG capsule Take 20 mg by mouth as needed.      ondansetron (ZOFRAN) 8 MG tablet Take 1 tablet (8 mg total) by mouth 2 (two) times daily as needed. Start on the third day after chemotherapy. 30 tablet 1   potassium chloride SA (K-DUR) 20 MEQ tablet TAKE 1 TABLET(20 MEQ) BY MOUTH TWICE DAILY 60 tablet 2   predniSONE (DELTASONE) 10 MG tablet Take  4 each am x 2 days,   2 each am x 2 days,  1 each am x 2 days and stop 14 tablet 0   prochlorperazine (COMPAZINE) 10 MG tablet Take 1 tablet (10 mg total) by mouth every 6 (six) hours as needed (Nausea or vomiting). 30 tablet 1   SYMBICORT 160-4.5 MCG/ACT inhaler INHALE 2 PUFFS BY MOUTH EVERY 12 HOURS 10.2 g 11   No current facility-administered medications for this visit.    Facility-Administered Medications Ordered in Other Visits    Medication Dose Route Frequency Provider Last Rate Last Dose   0.9 %  sodium chloride infusion   Intravenous Once Truitt Merle, MD       CISplatin (PLATINOL) 30 mg in sodium chloride 0.9 % 250 mL chemo infusion  20 mg/m2 (Treatment Plan Recorded) Intravenous Once Truitt Merle, MD       dextrose 5 % and 0.45% NaCl 1,000 mL with potassium chloride 20 mEq, magnesium sulfate 5 g infusion   Intravenous Once Truitt Merle, MD       fosaprepitant (EMEND) 150 mg, dexamethasone (DECADRON) 12 mg in sodium chloride 0.9 % 145 mL IVPB   Intravenous Once Truitt Merle, MD       furosemide (LASIX) injection 20 mg  20 mg Intravenous Once Truitt Merle, MD       gemcitabine (GEMZAR) 1,216 mg in sodium chloride 0.9 % 250 mL chemo infusion  800 mg/m2 (Treatment Plan Recorded) Intravenous Once Truitt Merle, MD       heparin lock flush 100 unit/mL  500 Units Intracatheter Once PRN Truitt Merle, MD       palonosetron (ALOXI) injection 0.25 mg  0.25 mg Intravenous Once Truitt Merle, MD       sodium chloride flush (NS) 0.9 % injection 10 mL  10 mL Intracatheter PRN Truitt Merle, MD        PHYSICAL EXAMINATION: ECOG PERFORMANCE STATUS: 1 - Symptomatic but completely ambulatory  Vitals with BMI 09/07/2019  Height   Weight 125 lbs 8 oz  BMI 32.44  Systolic 010  Diastolic 76  Pulse 66    GENERAL:alert, no distress and comfortable SKIN: skin color, texture, turgor are normal, no rashes or significant lesions EYES: normal, Conjunctiva are pink and non-injected, sclera clear  NECK: supple, thyroid normal size, non-tender, without nodularity LYMPH:  no palpable lymphadenopathy in the cervical, axillary  LUNGS: clear to auscultation and percussion with normal breathing effort HEART: regular rate & rhythm and no murmurs and no lower extremity edema ABDOMEN:abdomen soft, non-tender and normal bowel sounds Musculoskeletal:no cyanosis of digits and no clubbing  NEURO: alert & oriented x 3 with fluent speech, no focal motor/sensory  deficits  LABORATORY DATA:  I have reviewed the data as listed CBC Latest Ref Rng & Units 09/07/2019 08/24/2019 08/10/2019  WBC 4.0 - 10.5 K/uL 5.9 5.1 5.6  Hemoglobin 12.0 - 15.0 g/dL 10.2(L) 9.6(L) 10.2(L)  Hematocrit 36.0 - 46.0 % 31.0(L) 29.6(L) 30.8(L)  Platelets 150 - 400  K/uL 203 213 191     CMP Latest Ref Rng & Units 09/07/2019 08/24/2019 08/10/2019  Glucose 70 - 99 mg/dL 99 126(H) 91  BUN 8 - 23 mg/dL 13 17 12   Creatinine 0.44 - 1.00 mg/dL 1.04(H) 1.12(H) 0.94  Sodium 135 - 145 mmol/L 140 140 139  Potassium 3.5 - 5.1 mmol/L 4.6 4.3 5.1  Chloride 98 - 111 mmol/L 104 105 103  CO2 22 - 32 mmol/L 26 21(L) 25  Calcium 8.9 - 10.3 mg/dL 9.6 8.8(L) 9.3  Total Protein 6.5 - 8.1 g/dL 8.3(H) 8.1 8.2(H)  Total Bilirubin 0.3 - 1.2 mg/dL 0.4 0.3 0.2(L)  Alkaline Phos 38 - 126 U/L 88 84 86  AST 15 - 41 U/L 26 28 28   ALT 0 - 44 U/L 14 16 16       RADIOGRAPHIC STUDIES: I have personally reviewed the radiological images as listed and agreed with the findings in the report. No results found.   ASSESSMENT & PLAN:  Cassie Campbell is a 78 y.o. 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 Tolerating well. -Her[employer]Onegenomic testing results showed MSI stable disease, low tumor mutation burden,notargetable mutation such asFGFR fusion or IDH mutation, but (+) HER2 amplification, may try Herceptin in the future. -She is overall responding well tocurrentregimen. I discussedwe plan to continue until no longer tolerable or she has disease progression. She is fine to take chemo breakas needed.  -I personally reviewed and discussed her CT CAP from 08/22/19 which shows stable disease. Her chemo is still controlling her  disease currently and will continue current regimen.  -Her tumor marker is still trending up and may indicate early resistance. I again discussed the option of change her current cisplatin/gem to weekly 2 weeks on and one week off, she does not think she can tolerate and declined.  -S/p cycle 9 her side effects have accumulated and experienced significant fatigue, diarrhea, nausea and mild SOB.  -With dose reduction she tolerated Gem/Cis better  -Labs reviewed, CBC and CMP WNL except Hg 10.2, Cr 1.04, protein 8.3. Mag normal. Overall adequate to proceed with Gem/cis today  -She has completed Prednisone. She can now use 2-38m Dexa in the morning for 3 days after infusion to help her nausea and fatigue.  -f/uin2 weeks. I encouraged her to contact clinic if she develops significant or unexpected side effects.  2. CAD, S/P CABGX2 on 09/27/2018, EF 40-45% -Currently on Plavix. She also takes lasix during chemo. -f/u with Dr. KClaiborne Billings-stable,she appears to be euvolemic  3. Asthma -Currently on Symbicort, Singulair, and albuterol as needed -f/u with pulmonary, stable, no recent flare -She has recently been SOB, Her PCP put her on recent Prednisone taper in 08/2019 and her breathing has improved, adequate.    4.Goal of care discussion -The patient understands the goal of care is palliative. -she is full code for now  5.Hypokalemiaand hypomagnesemia -Likely secondary to lasix. Currently on KCL 27mdaily and mag-ox 4004md  -Due topersistent hypomagnesemia she is on oral mag TID -Knormaland Mag at1.8 today (09/07/19).  6. Mild Anemia -Secondary to chemoand her recent open heart surgery -Hg at10.2today (09/07/19).  -Oral iron is causing her bloating so she takes it every other day. I will check iron study and if adequate, will stop oral  iron. -I discussed her anemia may contribute to her fatigue and mild SOB.   7.Chronic left upper leg pain -likelymusculoskeletalat  this point and related to prior surgeries.Tolerable, She is still functional -She uses Celebrex and Tylenol.  -I encouraged her to discuss Celebrex use with her cardiologist. She can continue Tylenol for now.   8. Acid Reflux -Not controlled on Pepcid. I recommend she try Prilosec which is stronger. She is willing to try  9. Fatigue, Diarrhea, nausea, secondary to chemo -S/p cycle 9 her side effects have accumulated and experienced significant fatigue, diarrhea, nausea and mild SOB. Dose was reduced.   -Symptoms improved with chemo dose reduction. Will continue every 2 weeks.  -Since completing prednisone she will now use Dexa 2-52m for 3 days after chemo.    Plan -Labs reviewed and adequate to proceed with gem/Cis todaywith same dose reduction  -she will take dexa 2-463mdaily for 3 days after chemo -continueZometaevery 3 months, next in 10/2019 -lab, flush, gem/Cisin 2 weeks.   No problem-specific Assessment & Plan notes found for this encounter.   No orders of the defined types were placed in this encounter.  All questions were answered. The patient knows to call the clinic with any problems, questions or concerns. No barriers to learning was detected. I spent 20 minutes counseling the patient face to face. The total time spent in the appointment was 25 minutes and more than 50% was on counseling and review of test results     YaTruitt MerleMD 09/07/2019   I, AmJoslyn Devonam acting as scribe for YaTruitt MerleMD.   I have reviewed the above documentation for accuracy and completeness, and I agree with the above.

## 2019-09-07 ENCOUNTER — Ambulatory Visit: Payer: Medicare Other | Admitting: Hematology

## 2019-09-07 ENCOUNTER — Inpatient Hospital Stay: Payer: Medicare Other

## 2019-09-07 ENCOUNTER — Inpatient Hospital Stay (HOSPITAL_BASED_OUTPATIENT_CLINIC_OR_DEPARTMENT_OTHER): Payer: Medicare Other | Admitting: Hematology

## 2019-09-07 ENCOUNTER — Other Ambulatory Visit: Payer: Self-pay

## 2019-09-07 ENCOUNTER — Telehealth: Payer: Self-pay | Admitting: Hematology

## 2019-09-07 VITALS — BP 148/76 | HR 66 | Temp 98.5°F | Resp 16 | Wt 125.5 lb

## 2019-09-07 DIAGNOSIS — C23 Malignant neoplasm of gallbladder: Secondary | ICD-10-CM

## 2019-09-07 DIAGNOSIS — Z7189 Other specified counseling: Secondary | ICD-10-CM

## 2019-09-07 DIAGNOSIS — Z5111 Encounter for antineoplastic chemotherapy: Secondary | ICD-10-CM | POA: Diagnosis not present

## 2019-09-07 LAB — CMP (CANCER CENTER ONLY)
ALT: 14 U/L (ref 0–44)
AST: 26 U/L (ref 15–41)
Albumin: 3.8 g/dL (ref 3.5–5.0)
Alkaline Phosphatase: 88 U/L (ref 38–126)
Anion gap: 10 (ref 5–15)
BUN: 13 mg/dL (ref 8–23)
CO2: 26 mmol/L (ref 22–32)
Calcium: 9.6 mg/dL (ref 8.9–10.3)
Chloride: 104 mmol/L (ref 98–111)
Creatinine: 1.04 mg/dL — ABNORMAL HIGH (ref 0.44–1.00)
GFR, Est AFR Am: >60 mL/min (ref >60)
GFR, Estimated: 52 mL/min — ABNORMAL LOW (ref >60)
Glucose, Bld: 99 mg/dL (ref 70–99)
Potassium: 4.6 mmol/L (ref 3.5–5.1)
Sodium: 140 mmol/L (ref 135–145)
Total Bilirubin: 0.4 mg/dL (ref 0.3–1.2)
Total Protein: 8.3 g/dL — ABNORMAL HIGH (ref 6.5–8.1)

## 2019-09-07 LAB — CBC WITH DIFFERENTIAL (CANCER CENTER ONLY)
Abs Immature Granulocytes: 0.03 10*3/uL (ref 0.00–0.07)
Basophils Absolute: 0 10*3/uL (ref 0.0–0.1)
Basophils Relative: 1 %
Eosinophils Absolute: 0.4 10*3/uL (ref 0.0–0.5)
Eosinophils Relative: 7 %
HCT: 31 % — ABNORMAL LOW (ref 36.0–46.0)
Hemoglobin: 10.2 g/dL — ABNORMAL LOW (ref 12.0–15.0)
Immature Granulocytes: 1 %
Lymphocytes Relative: 14 %
Lymphs Abs: 0.8 10*3/uL (ref 0.7–4.0)
MCH: 34.2 pg — ABNORMAL HIGH (ref 26.0–34.0)
MCHC: 32.9 g/dL (ref 30.0–36.0)
MCV: 104 fL — ABNORMAL HIGH (ref 80.0–100.0)
Monocytes Absolute: 0.6 10*3/uL (ref 0.1–1.0)
Monocytes Relative: 10 %
Neutro Abs: 4 10*3/uL (ref 1.7–7.7)
Neutrophils Relative %: 67 %
Platelet Count: 203 10*3/uL (ref 150–400)
RBC: 2.98 MIL/uL — ABNORMAL LOW (ref 3.87–5.11)
RDW: 18.5 % — ABNORMAL HIGH (ref 11.5–15.5)
WBC Count: 5.9 10*3/uL (ref 4.0–10.5)
nRBC: 0 % (ref 0.0–0.2)

## 2019-09-07 LAB — MAGNESIUM: Magnesium: 1.8 mg/dL (ref 1.7–2.4)

## 2019-09-07 MED ORDER — SODIUM CHLORIDE 0.9% FLUSH
10.0000 mL | INTRAVENOUS | Status: DC | PRN
  Administered 2019-09-07: 10 mL
  Filled 2019-09-07: qty 10

## 2019-09-07 MED ORDER — SODIUM CHLORIDE 0.9 % IV SOLN
Freq: Once | INTRAVENOUS | Status: AC
  Administered 2019-09-07: 12:00:00 via INTRAVENOUS
  Filled 2019-09-07: qty 5

## 2019-09-07 MED ORDER — HEPARIN SOD (PORK) LOCK FLUSH 100 UNIT/ML IV SOLN
500.0000 [IU] | Freq: Once | INTRAVENOUS | Status: AC | PRN
  Administered 2019-09-07: 500 [IU]
  Filled 2019-09-07: qty 5

## 2019-09-07 MED ORDER — SODIUM CHLORIDE 0.9 % IV SOLN
800.0000 mg/m2 | Freq: Once | INTRAVENOUS | Status: AC
  Administered 2019-09-07: 1216 mg via INTRAVENOUS
  Filled 2019-09-07: qty 31.98

## 2019-09-07 MED ORDER — POTASSIUM CHLORIDE 2 MEQ/ML IV SOLN
Freq: Once | INTRAVENOUS | Status: AC
  Administered 2019-09-07: 10:00:00 via INTRAVENOUS
  Filled 2019-09-07: qty 1000

## 2019-09-07 MED ORDER — PALONOSETRON HCL INJECTION 0.25 MG/5ML
0.2500 mg | Freq: Once | INTRAVENOUS | Status: AC
  Administered 2019-09-07: 0.25 mg via INTRAVENOUS

## 2019-09-07 MED ORDER — SODIUM CHLORIDE 0.9 % IV SOLN
Freq: Once | INTRAVENOUS | Status: AC
  Administered 2019-09-07: 09:00:00 via INTRAVENOUS
  Filled 2019-09-07: qty 250

## 2019-09-07 MED ORDER — FUROSEMIDE 10 MG/ML IJ SOLN
INTRAMUSCULAR | Status: AC
  Filled 2019-09-07: qty 2

## 2019-09-07 MED ORDER — SODIUM CHLORIDE 0.9% FLUSH
10.0000 mL | Freq: Once | INTRAVENOUS | Status: AC | PRN
  Administered 2019-09-07: 08:00:00 10 mL
  Filled 2019-09-07: qty 10

## 2019-09-07 MED ORDER — PALONOSETRON HCL INJECTION 0.25 MG/5ML
INTRAVENOUS | Status: AC
  Filled 2019-09-07: qty 5

## 2019-09-07 MED ORDER — FUROSEMIDE 10 MG/ML IJ SOLN
20.0000 mg | Freq: Once | INTRAMUSCULAR | Status: AC
  Administered 2019-09-07: 20 mg via INTRAVENOUS

## 2019-09-07 MED ORDER — SODIUM CHLORIDE 0.9 % IV SOLN
20.0000 mg/m2 | Freq: Once | INTRAVENOUS | Status: AC
  Administered 2019-09-07: 30 mg via INTRAVENOUS
  Filled 2019-09-07: qty 30

## 2019-09-07 MED ORDER — SODIUM CHLORIDE 0.9 % IV SOLN
Freq: Once | INTRAVENOUS | Status: DC
  Filled 2019-09-07: qty 250

## 2019-09-07 NOTE — Telephone Encounter (Signed)
Scheduled appt per 9/18 los.  Patient will get a print out while in treatment.

## 2019-09-07 NOTE — Patient Instructions (Signed)
[facility] Discharge Instructions for Patients Receiving Chemotherapy  Today you received the following chemotherapy agents:  Cisplatin, gemcitibine  To help prevent nausea and vomiting after your treatment, we encourage you to take your nausea medication as prescribed.   If you develop nausea and vomiting that is not controlled by your nausea medication, call the clinic.   BELOW ARE SYMPTOMS THAT SHOULD BE REPORTED IMMEDIATELY:  *FEVER GREATER THAN 100.5 F  *CHILLS WITH OR WITHOUT FEVER  NAUSEA AND VOMITING THAT IS NOT CONTROLLED WITH YOUR NAUSEA MEDICATION  *UNUSUAL SHORTNESS OF BREATH  *UNUSUAL BRUISING OR BLEEDING  TENDERNESS IN MOUTH AND THROAT WITH OR WITHOUT PRESENCE OF ULCERS  *URINARY PROBLEMS  *BOWEL PROBLEMS  UNUSUAL RASH Items with * indicate a potential emergency and should be followed up as soon as possible.  Feel free to call the clinic should you have any questions or concerns. The clinic phone number is [phone].  Please show the [place] at check-in to the Emergency Department and triage nurse.

## 2019-09-08 ENCOUNTER — Encounter: Payer: Self-pay | Admitting: Hematology

## 2019-09-08 LAB — CANCER ANTIGEN 19-9: CA 19-9: 2551 U/mL — ABNORMAL HIGH (ref 0–35)

## 2019-09-15 ENCOUNTER — Other Ambulatory Visit: Payer: Self-pay | Admitting: Hematology

## 2019-09-18 NOTE — Progress Notes (Signed)
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 Lymph node, biopsy, mediastinal - LYMPH NODE WITH METASTATIC ADENOCARCINOMA. - SEE MICROSCOPIC DESCRIPTION 2. Plaque, coronary artery - CALCIFIED ATHEROSCLEROTIC PLAQUE.   10/14/2018 Miscellaneous   Foundation One:  MSI stable tumor mutation burden 3Muts/mb ERBB2 amplification CCNE1 amplification TP53 mutation(+) CDK6 amplification  HGF amplification    11/13/2018 Cancer Staging   Staging form: Gallbladder, AJCC 8th Edition - Clinical stage from 11/13/2018: Stage IVB (cTX, cN2, pM1) - Signed by Truitt Merle, MD on 12/15/2018   11/17/2018 Imaging   11/17/2018 CT CAP IMPRESSION: 1. 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 This is associated with extensive upper abdominal and retroperitoneal lymphadenopathy, as well as metastatic lymphadenopathy in the posterior mediastinum and left supraclavicular region. There is also a metastatic lesion to the left ischium. 2. 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 3. Aortic atherosclerosis, in addition to left main and 3 vessel coronary artery disease. Status post median sternotomy for CABG including LIMA to the LAD. 4. There are calcifications of the aortic valve. Echocardiographic correlation for  evaluation of potential valvular dysfunction may be warranted if clinically indicated.    11/21/2018 Initial Diagnosis   Gallbladder cancer ([place])   11/27/2018 Pathology Results   11/27/2018 CA19-9 immunohistochemical stain Per request, a CA19-9 immunohistochemical stain was performed at an outside institution revealing positive staining in the tumor cells.    12/15/2018 -  Chemotherapy    first line chemo cisplatin and gemcitabine every 2 weeks on 12/15/18   02/20/2019 PET scan   Restaging PET scan:  IMPRESSION: 1. Positive response to therapy at all primary and metastatic sites. Persistent primary and metastatic lesions do have intense hypermetabolic activity albeit reduced. 2. Decrease in size and hypermetabolic activity of mass lesion in the gallbladder fundus. 3. Interval decrease in size and metabolic activity of periportal adenopathy. 4. Decrease in size and metabolic activity of periaortic lymph nodes. 5. Interval decrease in size and metabolic activity of destructive lesion in the LEFT inferior pubic ramus with new sclerosis. 6. No new or progressive disease.   05/29/2019 Imaging   CT CAP W Contrast  IMPRESSION: Known gallbladder adenocarcinoma, difficult to compare to recent PET, improved from prior CT.  Upper abdominal/retroperitoneal lymphadenopathy, slightly improved from prior PET.  Osseous metastasis involving the left inferior pubic ramus, grossly unchanged.  No evidence of metastatic disease in the chest.  Additional ancillary findings as above.   08/22/2019 Imaging   CT CAP W Contrast  IMPRESSION: 1. Stable soft tissue mass involving the gallbladder fundus. 2. Stable mild porta hepatis and retroperitoneal lymphadenopathy. 3. Stable sclerotic bone metastases. 4. No new or progressive metastatic disease identified. 5. Stable 2.3 cm left thyroid lobe nodule.      CURRENT THERAPY:  -First linechemo cisplatin and gemcitabineevery 2 weekson  12/15/18. Had 1 month chemo breakin April, restarted on 04/20/19 -Zometa  q12weeks starting 01/26/19  INTERVAL HISTORY:  Cassie Campbell is here for a follow up and treatment. She presents to the clinic alone. She notes she is doing better. She notes with last chemo she has started having SOB which is still occurring. She notes she can walk from lobby to back treatment area. She can keep going though. She denied chest pain and does not feel related to her heart. She notes she has asthma, but she denies wheezing. She has been using inhaler which helps her catch her breath. From baseline her currently breathing is only slightly worse. She denies cough or fever. She notes her energy is still adequate. She notes mild numbness of fingers with no change in dexterity    REVIEW OF SYSTEMS:   Constitutional: Denies fevers, chills or abnormal weight loss Eyes: Denies blurriness of vision Ears, nose, mouth, throat, and face: Denies mucositis or sore throat Respiratory: Denies cough,  Wheezes (+) SOB upon mild exertion Cardiovascular: Denies palpitation, chest discomfort or lower extremity swelling Gastrointestinal:  Denies nausea, heartburn or change in bowel habits Skin: Denies abnormal skin rashes Lymphatics: Denies new lymphadenopathy or easy bruising Neurological: (+) Mild numbness of fingers Behavioral/Psych: Mood is stable, no new changes  All other systems were reviewed with the patient and are negative.  MEDICAL HISTORY:  Past Medical History:  Diagnosis Date  . Allergic rhinitis   . Arthritis   . Asthma    xolair s 8/05 ?11/07; mastered hfa 12/20/08  . Benign positional vertigo   . CAD (coronary artery disease)    a. 09/2014 NSTEMI s/p LHC with sig 2V dz. dLAD diffusely diseased and not suitable for PCI. unsuccessful RCA PCI d/t heavy calcifications  . gallbladder ca dx'd 10/2018  . GERD (gastroesophageal reflux disease)   . Heart attack ([place])    09/2014  . Hyperlipidemia    <130 ldl  pos fm hx, bp  . Hypertension   . Osteopenia    dexa 08/22/07 AP spine + 1.1, left femur -1.3, right femur -.8; dexa 10/06/09 +1.6, left femur =1.6, right femur -.  . PONV (postoperative nausea and vomiting)   . Ruptured disc, cervical   . 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 BREAST SURGERY  10-26-10   Rt. breast bx--for microcalcifications in rt. retroareolar region--dx was hyalinized fibroadenoma  . BUNIONECTOMY Bilateral   . CARDIAC CATHETERIZATION     2015  . CATARACT EXTRACTION Right 02/02/2018   Dr Satira Sark  . CATARACT EXTRACTION Left 03/30/2018  . [place] SURGERY  12/01  . CORONARY ARTERY BYPASS GRAFT N/A 10/04/2018   Procedure: CORONARY ARTERY BYPASS GRAFTING (CABG) times 2 using left  Internal mammary artery to LAD, left greater saphenous vein - open harvest.;  Surgeon: [name], MD;  Location: [facility];  Service: Open Heart Surgery;  Laterality: N/A;  . IR IMAGING GUIDED PORT INSERTION  12/08/2018  . LEFT HEART CATH AND CORONARY ANGIOGRAPHY N/A 09/27/2018   Procedure: LEFT HEART CATH AND CORONARY ANGIOGRAPHY;  Surgeon: Troy Sine, MD;  Location: [facility] CV [facility];  Service: Cardiovascular;  Laterality: N/A;  . LEFT HEART CATHETERIZATION WITH CORONARY ANGIOGRAM N/A 10/16/2014   Procedure: LEFT HEART CATHETERIZATION WITH CORONARY ANGIOGRAM;  Surgeon: Blane Ohara, MD;  [facility] C[facility];  Service: Cardiovascular;  Laterality: N/A;  . Left L4-5 transforaminal lumbar interbody fusion with Depuy cage, rods and screws, local  and allograft bone graft, Vivigen; bilateral decompression/partial hemilaminectomy lumbar five-sacral one  07/2017  . PERCUTANEOUS CORONARY ROTOBLATOR INTERVENTION (PCI-R) N/A 10/17/2014   Procedure: PERCUTANEOUS CORONARY ROTOBLATOR INTERVENTION (PCI-R);  Surgeon: Troy Sine, MD;  Location: [facility] CATH LAB;  Service: Cardiovascular;   Laterality: N/A;  . TEE WITHOUT CARDIOVERSION N/A 10/04/2018   Procedure: TRANSESOPHAGEAL ECHOCARDIOGRAM (TEE);  Surgeon: [name], MD;  Location: [facility];  Service: Open Heart Surgery;  Laterality: N/A;  . VEIN LIGATION AND STRIPPING Left   . VIDEO BRONCHOSCOPY Bilateral 02/05/2015   Procedure: VIDEO BRONCHOSCOPY WITHOUT FLUORO;  Surgeon: Tanda Rockers, MD;  Location: [facility];  Service: Endoscopy;  Laterality: Bilateral;  . VULVA /PERINEUM BIOPSY  12-30-10   --epidermoid cyst    I have reviewed the social history and family history with the patient and they are unchanged from previous note.  ALLERGIES:  is allergic to fish-derived products; penicillins; aspirin; brilinta [ticagrelor]; codeine phosphate; and diphenhydramine hcl.  MEDICATIONS:  Current Outpatient Medications  Medication Sig Dispense Refill  . albuterol (PROAIR HFA) 108 (90 Base) MCG/ACT inhaler INHALE 1 TO 2 PUFFS BY MOUTH EVERY 4 HOURS AS NEEDED FOR WHEEZING 8.5 g 3  . atorvastatin (LIPITOR) 80 MG tablet TAKE 1 TABLET(80 MG) BY MOUTH DAILY 90 tablet 1  . bisoprolol (ZEBETA) 10 MG tablet Take 1 tablet (10 mg total) by mouth daily. 90 tablet 1  . celecoxib (CELEBREX) 200 MG capsule Take 200 mg by mouth 2 (two) times daily as needed.    . cetirizine (ZYRTEC) 10 MG tablet Take 10 mg by mouth at bedtime as needed for allergies.    . cholecalciferol 2000 units TABS Take 1 tablet (2,000 Units total) by mouth daily. 30 tablet 3  . clopidogrel (PLAVIX) 75 MG tablet Take 1 tablet (75 mg total) by mouth daily. 90 tablet 3  . dexamethasone (DECADRON) 4 MG tablet Take 1 tablet (4 mg total) by mouth 2 (two) times daily with a meal. 20 tablet 0  . dextromethorphan-guaiFENesin (MUCINEX DM) 30-600 MG per 12 hr tablet Take 1-2 tablets by mouth every 12 (twelve) hours as needed for cough (with flutter).     . famotidine (PEPCID) 20 MG tablet Take 20 mg by mouth as needed.     . ferrous sulfate 325 (65 FE) MG tablet Take 650 mg  by mouth daily with breakfast.     . fluticasone (FLONASE) 50 MCG/ACT nasal spray Place 1-2 sprays into both nostrils 2 (two) times daily as needed for allergies or rhinitis.    . furosemide (LASIX) 20 MG tablet Take 2 tablets (40 mg total) by mouth daily. 180 tablet 3  . HYDROcodone-acetaminophen (NORCO/VICODIN) 5-325 MG tablet Take 1 tablet by mouth every 6 (six) hours as needed for moderate pain. 30 tablet 0  . lidocaine-prilocaine (EMLA) cream Apply to affected area once 30 g 3  . magic mouthwash SOLN Take 5 mLs by mouth 4 (four) times daily. Leave Benadryl out of compound since pt allergic to it. Use hydrocortisone & nystatin. Swish & swallow or spit for thrush 240 mL 1  . magnesium oxide (MAGNESIUM-OXIDE) 400 (241.3 Mg) MG tablet Take 1 tablet (400 mg total) by mouth 3 (three) times daily. 90 tablet 1  . meclizine (ANTIVERT) 25 MG tablet TAKE 2 TABLETS BY MOUTH THREE TIMES DAILY AS NEEDED 24 tablet 2  . montelukast (SINGULAIR) 10 MG tablet TAKE 1 TABLET(10 MG) BY MOUTH AT BEDTIME 90 tablet 1  . Multiple Vitamin (MULTIVITAMIN) capsule Take 1  capsule by mouth daily.     Marland Kitchen omeprazole (PRILOSEC) 20 MG capsule Take 20 mg by mouth as needed.     . ondansetron (ZOFRAN) 8 MG tablet Take 1 tablet (8 mg total) by mouth 2 (two) times daily as needed. Start on the third day after chemotherapy. 30 tablet 1  . potassium chloride SA (K-DUR) 20 MEQ tablet TAKE 1 TABLET(20 MEQ) BY MOUTH TWICE DAILY 60 tablet 2  . predniSONE (DELTASONE) 10 MG tablet Take  4 each am x 2 days,   2 each am x 2 days,  1 each am x 2 days and stop 14 tablet 0  . prochlorperazine (COMPAZINE) 10 MG tablet Take 1 tablet (10 mg total) by mouth every 6 (six) hours as needed (Nausea or vomiting). 30 tablet 1  . SYMBICORT 160-4.5 MCG/ACT inhaler INHALE 2 PUFFS BY MOUTH EVERY 12 HOURS 10.2 g 11   No current facility-administered medications for this visit.    Facility-Administered Medications Ordered in Other Visits  Medication Dose Route  Frequency Provider Last Rate Last Dose  . dextrose 5 % and 0.45% NaCl 1,000 mL with potassium chloride 20 mEq, magnesium sulfate 5 g infusion   Intravenous Once Truitt Merle, MD        PHYSICAL EXAMINATION: ECOG PERFORMANCE STATUS: 1 - Symptomatic but completely ambulatory  Vitals with BMI 09/21/2019  Height   Weight 126 lbs 4 oz  BMI 16.10  Systolic 960  Diastolic 79  Pulse 82    GENERAL:alert, no distress and comfortable SKIN: skin color, texture, turgor are normal, no rashes or significant lesions EYES: normal, Conjunctiva are pink and non-injected, sclera clear  NECK: supple, thyroid normal size, non-tender, without nodularity LYMPH:  no palpable lymphadenopathy in the cervical, axillary  LUNGS: clear to auscultation and percussion with normal breathing effort (+) Mild wheezing.  HEART: regular rate & rhythm and no murmurs and no lower extremity edema ABDOMEN:abdomen soft, non-tender and normal bowel sounds Musculoskeletal:no cyanosis of digits and no clubbing  NEURO: alert & oriented x 3 with fluent speech, no focal motor/sensory deficits  LABORATORY DATA:  I have reviewed the data as listed CBC Latest Ref Rng & Units 09/21/2019 09/07/2019 08/24/2019  WBC 4.0 - 10.5 K/uL 7.1 5.9 5.1  Hemoglobin 12.0 - 15.0 g/dL 9.6(L) 10.2(L) 9.6(L)  Hematocrit 36.0 - 46.0 % 30.0(L) 31.0(L) 29.6(L)  Platelets 150 - 400 K/uL 213 203 213     CMP Latest Ref Rng & Units 09/21/2019 09/07/2019 08/24/2019  Glucose 70 - 99 mg/dL 92 99 126(H)  BUN 8 - 23 mg/dL '15 13 17  '$ Creatinine 0.44 - 1.00 mg/dL 1.01(H) 1.04(H) 1.12(H)  Sodium 135 - 145 mmol/L 138 140 140  Potassium 3.5 - 5.1 mmol/L 4.6 4.6 4.3  Chloride 98 - 111 mmol/L 104 104 105  CO2 22 - 32 mmol/L 25 26 21(L)  Calcium 8.9 - 10.3 mg/dL 9.3 9.6 8.8(L)  Total Protein 6.5 - 8.1 g/dL 8.1 8.3(H) 8.1  Total Bilirubin 0.3 - 1.2 mg/dL 0.3 0.4 0.3  Alkaline Phos 38 - 126 U/L 88 88 84  AST 15 - 41 U/L '28 26 28  '$ ALT 0 - 44 U/L '15 14 16       '$ RADIOGRAPHIC STUDIES: I have personally reviewed the radiological images as listed and agreed with the findings in the report. No results found.   ASSESSMENT & PLAN:  Cassie NORDELL is a 78 y.o. 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 Tolerating well. -[employer] testing results showed MSI stable disease, low tumor mutation burden,notargetable mutation such asFGFR fusion or IDH mutation, but (+) HER2 amplification, may try Herceptin in the future. -She is overall responding well tocurrentregimen. I discussedwe plan to continue until no longer tolerable or she has disease progression. She is fine to take chemo breakas needed.  -I personally reviewed anddiscussed her CT CAP from 08/22/19 which showsstable disease. Her chemo is still controlling her disease currently and will continue current regimen.  -Her tumor marker is still trending up and may indicate early resistance. I again discussed the option of change her current cisplatin/gem to weekly 2 weeks on and one week off, she does not think she can tolerate and declined. -S/p cycle 9 her side effects have accumulated and experienced significant fatigue, diarrhea, nausea and mild SOB.  -With dose reduction she tolerated Gem/Cis better. She has mild numbness of fingers with no change in dexterity. Will monitor.  -Labs reviewed, CBC and CMP WNL except HG 9.6, Mag 1.8. Overall adequate to proceed with Gem/cis today  -she has been getting iv magnesium 5g with hydration for chemo, will continue  -f/uin2weeks.    2. CAD, S/P CABGX2 on 09/27/2018, EF 40-45% -Currently on Plavix. She also takes lasix during chemo. -f/u with Dr. Claiborne Billings -stable,she  appears to be euvolemic  3. Asthma, dyspnea  -Currently on Symbicort, Singulair, and albuterol as needed -f/u with pulmonary, stable, no recent flare -She has recently been little more dyspneic. She can use dexa after chemo, and as needed for worsening wheezing   4.Goal of care discussion -The patient understands the goal of care is palliative. -she is full code for now  5.Hypokalemiaand hypomagnesemia -Likely secondary to lasix. Currently on KCL 51mqdailyand mag-ox '400mg'$ tid -Due topersistent hypomagnesemiashe is onoral magTID -K and Mag normal today   6. Mild Anemia -Secondary to chemoand her recent open heart surgery -Hg at9.6today (09/21/19), slightly worse.  -I discussed her anemia may contribute to her mild SOB. -She has oral iron but has not been taking daily because it causes bloating. I encouraged her to take prenatal vitamin to help her iron level. She is agreeable   7.Chronic left upper leg pain -likelymusculoskeletalat this point and related to prior surgeries.Tolerable, She is still functional -She uses Celebrex and Tylenol.  -I encouraged her to discuss Celebrex use with her cardiologist. She can continue Tylenol for now.   8. Acid Reflux -Not controlled on Pepcid. I recommend she try Prilosec which is stronger. She is willing to try   Plan -Labs reviewed and adequate to proceed with gem/Cis today, iv hydration with magnesium 5g, spoke with pharmacy  -continueZometaevery3 months, next in 10/2019 -lab, flush, f/u and gem/Cisin2weeks.   No problem-specific Assessment & Plan notes found for this encounter.   No orders of the defined types were placed in this encounter.  All questions were answered. The patient knows to call the clinic with any problems, questions or concerns. No barriers to learning was detected. I spent 20 minutes counseling the patient face to face. The total time spent in the appointment was 25 minutes and  more than 50% was on counseling and review of test results     YTruitt Merle MD 09/21/2019   I, AJoslyn Devon am acting as scribe for YTruitt Merle  MD.   I have reviewed the above documentation for accuracy and completeness, and I agree with the above.

## 2019-09-21 ENCOUNTER — Other Ambulatory Visit: Payer: Self-pay

## 2019-09-21 ENCOUNTER — Inpatient Hospital Stay: Payer: Medicare Other | Attending: Obstetrics

## 2019-09-21 ENCOUNTER — Inpatient Hospital Stay: Payer: Medicare Other

## 2019-09-21 ENCOUNTER — Inpatient Hospital Stay (HOSPITAL_BASED_OUTPATIENT_CLINIC_OR_DEPARTMENT_OTHER): Payer: Medicare Other | Admitting: Hematology

## 2019-09-21 ENCOUNTER — Encounter: Payer: Self-pay | Admitting: Hematology

## 2019-09-21 VITALS — BP 131/79 | HR 82 | Temp 98.8°F | Resp 18 | Wt 126.2 lb

## 2019-09-21 DIAGNOSIS — C7951 Secondary malignant neoplasm of bone: Secondary | ICD-10-CM | POA: Diagnosis not present

## 2019-09-21 DIAGNOSIS — D6481 Anemia due to antineoplastic chemotherapy: Secondary | ICD-10-CM | POA: Insufficient documentation

## 2019-09-21 DIAGNOSIS — I2510 Atherosclerotic heart disease of native coronary artery without angina pectoris: Secondary | ICD-10-CM | POA: Insufficient documentation

## 2019-09-21 DIAGNOSIS — E876 Hypokalemia: Secondary | ICD-10-CM | POA: Insufficient documentation

## 2019-09-21 DIAGNOSIS — M79652 Pain in left thigh: Secondary | ICD-10-CM | POA: Diagnosis not present

## 2019-09-21 DIAGNOSIS — C23 Malignant neoplasm of gallbladder: Secondary | ICD-10-CM

## 2019-09-21 DIAGNOSIS — K219 Gastro-esophageal reflux disease without esophagitis: Secondary | ICD-10-CM | POA: Insufficient documentation

## 2019-09-21 DIAGNOSIS — Z7189 Other specified counseling: Secondary | ICD-10-CM

## 2019-09-21 DIAGNOSIS — C771 Secondary and unspecified malignant neoplasm of intrathoracic lymph nodes: Secondary | ICD-10-CM | POA: Insufficient documentation

## 2019-09-21 DIAGNOSIS — Z5111 Encounter for antineoplastic chemotherapy: Secondary | ICD-10-CM | POA: Diagnosis present

## 2019-09-21 DIAGNOSIS — J45909 Unspecified asthma, uncomplicated: Secondary | ICD-10-CM | POA: Diagnosis not present

## 2019-09-21 LAB — CMP (CANCER CENTER ONLY)
ALT: 15 U/L (ref 0–44)
AST: 28 U/L (ref 15–41)
Albumin: 3.7 g/dL (ref 3.5–5.0)
Alkaline Phosphatase: 88 U/L (ref 38–126)
Anion gap: 9 (ref 5–15)
BUN: 15 mg/dL (ref 8–23)
CO2: 25 mmol/L (ref 22–32)
Calcium: 9.3 mg/dL (ref 8.9–10.3)
Chloride: 104 mmol/L (ref 98–111)
Creatinine: 1.01 mg/dL — ABNORMAL HIGH (ref 0.44–1.00)
GFR, Est AFR Am: >60 mL/min (ref >60)
GFR, Estimated: 54 mL/min — ABNORMAL LOW (ref >60)
Glucose, Bld: 92 mg/dL (ref 70–99)
Potassium: 4.6 mmol/L (ref 3.5–5.1)
Sodium: 138 mmol/L (ref 135–145)
Total Bilirubin: 0.3 mg/dL (ref 0.3–1.2)
Total Protein: 8.1 g/dL (ref 6.5–8.1)

## 2019-09-21 LAB — CBC WITH DIFFERENTIAL (CANCER CENTER ONLY)
Abs Immature Granulocytes: 0.03 10*3/uL (ref 0.00–0.07)
Basophils Absolute: 0 10*3/uL (ref 0.0–0.1)
Basophils Relative: 1 %
Eosinophils Absolute: 0.4 10*3/uL (ref 0.0–0.5)
Eosinophils Relative: 5 %
HCT: 30 % — ABNORMAL LOW (ref 36.0–46.0)
Hemoglobin: 9.6 g/dL — ABNORMAL LOW (ref 12.0–15.0)
Immature Granulocytes: 0 %
Lymphocytes Relative: 16 %
Lymphs Abs: 1.1 10*3/uL (ref 0.7–4.0)
MCH: 33.9 pg (ref 26.0–34.0)
MCHC: 32 g/dL (ref 30.0–36.0)
MCV: 106 fL — ABNORMAL HIGH (ref 80.0–100.0)
Monocytes Absolute: 0.8 10*3/uL (ref 0.1–1.0)
Monocytes Relative: 12 %
Neutro Abs: 4.7 10*3/uL (ref 1.7–7.7)
Neutrophils Relative %: 66 %
Platelet Count: 213 10*3/uL (ref 150–400)
RBC: 2.83 MIL/uL — ABNORMAL LOW (ref 3.87–5.11)
RDW: 18.6 % — ABNORMAL HIGH (ref 11.5–15.5)
WBC Count: 7.1 10*3/uL (ref 4.0–10.5)
nRBC: 0 % (ref 0.0–0.2)

## 2019-09-21 LAB — MAGNESIUM: Magnesium: 1.8 mg/dL (ref 1.7–2.4)

## 2019-09-21 MED ORDER — PALONOSETRON HCL INJECTION 0.25 MG/5ML
0.2500 mg | Freq: Once | INTRAVENOUS | Status: AC
  Administered 2019-09-21: 0.25 mg via INTRAVENOUS

## 2019-09-21 MED ORDER — PALONOSETRON HCL INJECTION 0.25 MG/5ML
INTRAVENOUS | Status: AC
  Filled 2019-09-21: qty 5

## 2019-09-21 MED ORDER — SODIUM CHLORIDE 0.9% FLUSH
10.0000 mL | Freq: Once | INTRAVENOUS | Status: AC | PRN
  Administered 2019-09-21: 09:00:00 10 mL
  Filled 2019-09-21: qty 10

## 2019-09-21 MED ORDER — FUROSEMIDE 10 MG/ML IJ SOLN
20.0000 mg | Freq: Once | INTRAMUSCULAR | Status: AC
  Administered 2019-09-21: 20 mg via INTRAVENOUS

## 2019-09-21 MED ORDER — POTASSIUM CHLORIDE 2 MEQ/ML IV SOLN
Freq: Once | INTRAVENOUS | Status: AC
  Administered 2019-09-21: 11:00:00 via INTRAVENOUS
  Filled 2019-09-21: qty 1000

## 2019-09-21 MED ORDER — SODIUM CHLORIDE 0.9 % IV SOLN
20.0000 mg/m2 | Freq: Once | INTRAVENOUS | Status: AC
  Administered 2019-09-21: 31 mg via INTRAVENOUS
  Filled 2019-09-21: qty 31

## 2019-09-21 MED ORDER — SODIUM CHLORIDE 0.9 % IV SOLN
800.0000 mg/m2 | Freq: Once | INTRAVENOUS | Status: AC
  Administered 2019-09-21: 1216 mg via INTRAVENOUS
  Filled 2019-09-21: qty 31.98

## 2019-09-21 MED ORDER — SODIUM CHLORIDE 0.9 % IV SOLN
Freq: Once | INTRAVENOUS | Status: AC
  Administered 2019-09-21: 10:00:00 via INTRAVENOUS
  Filled 2019-09-21: qty 250

## 2019-09-21 MED ORDER — HEPARIN SOD (PORK) LOCK FLUSH 100 UNIT/ML IV SOLN
500.0000 [IU] | Freq: Once | INTRAVENOUS | Status: AC | PRN
  Administered 2019-09-21: 500 [IU]
  Filled 2019-09-21: qty 5

## 2019-09-21 MED ORDER — FUROSEMIDE 10 MG/ML IJ SOLN
INTRAMUSCULAR | Status: AC
  Filled 2019-09-21: qty 2

## 2019-09-21 MED ORDER — SODIUM CHLORIDE 0.9% FLUSH
10.0000 mL | INTRAVENOUS | Status: DC | PRN
  Administered 2019-09-21: 10 mL
  Filled 2019-09-21: qty 10

## 2019-09-21 MED ORDER — ACETAMINOPHEN 325 MG PO TABS
650.0000 mg | ORAL_TABLET | Freq: Once | ORAL | Status: AC
  Administered 2019-09-21: 650 mg via ORAL

## 2019-09-21 MED ORDER — POTASSIUM CHLORIDE 2 MEQ/ML IV SOLN
Freq: Once | INTRAVENOUS | Status: DC
  Filled 2019-09-21: qty 1000

## 2019-09-21 MED ORDER — SODIUM CHLORIDE 0.9 % IV SOLN
Freq: Once | INTRAVENOUS | Status: AC
  Administered 2019-09-21: 13:00:00 via INTRAVENOUS
  Filled 2019-09-21: qty 5

## 2019-09-21 MED ORDER — ACETAMINOPHEN 325 MG PO TABS
ORAL_TABLET | ORAL | Status: AC
  Filled 2019-09-21: qty 2

## 2019-09-21 NOTE — Addendum Note (Signed)
Addended by: Truitt Merle on: 09/21/2019 11:09 AM   Modules accepted: Orders

## 2019-09-21 NOTE — Patient Instructions (Signed)
[facility] Discharge Instructions for Patients Receiving Chemotherapy  Today you received the following chemotherapy agents:  Cisplatin and Gemcitibine.  To help prevent nausea and vomiting after your treatment, we encourage you to take your nausea medication as prescribed.   If you develop nausea and vomiting that is not controlled by your nausea medication, call the clinic.   BELOW ARE SYMPTOMS THAT SHOULD BE REPORTED IMMEDIATELY:  *FEVER GREATER THAN 100.5 F  *CHILLS WITH OR WITHOUT FEVER  NAUSEA AND VOMITING THAT IS NOT CONTROLLED WITH YOUR NAUSEA MEDICATION  *UNUSUAL SHORTNESS OF BREATH  *UNUSUAL BRUISING OR BLEEDING  TENDERNESS IN MOUTH AND THROAT WITH OR WITHOUT PRESENCE OF ULCERS  *URINARY PROBLEMS  *BOWEL PROBLEMS  UNUSUAL RASH Items with * indicate a potential emergency and should be followed up as soon as possible.  Feel free to call the clinic should you have any questions or concerns. The clinic phone number is [phone].  Please show the [place] at check-in to the Emergency Department and triage nurse.

## 2019-09-21 NOTE — Progress Notes (Signed)
Mag

## 2019-09-21 NOTE — Progress Notes (Signed)
I s/w Dr. Burr Medico re: Mg IVF. We will remove the NS w/ Mg 1 g bag in treatment plan. Pt will get Mg 5 g (as from 08/24/19 note) w/ each cycle unless Mg is high. We'll continue to monitor her Mg w/ each CDDP tx. Kennith Center, Pharm.D., CPP 09/21/2019@10 :29 AM

## 2019-09-24 ENCOUNTER — Telehealth: Payer: Self-pay | Admitting: Hematology

## 2019-09-24 NOTE — Telephone Encounter (Signed)
No los per 10/2. °

## 2019-10-01 NOTE — Progress Notes (Addendum)
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 Lymph node, biopsy, mediastinal - LYMPH NODE WITH METASTATIC ADENOCARCINOMA. - SEE MICROSCOPIC DESCRIPTION 2. Plaque, coronary artery - CALCIFIED ATHEROSCLEROTIC PLAQUE.   10/14/2018 Miscellaneous   Foundation One:  MSI stable tumor mutation burden 3Muts/mb ERBB2 amplification CCNE1 amplification TP53 mutation(+) CDK6 amplification  HGF amplification    11/13/2018 Cancer Staging   Staging form: Gallbladder, AJCC 8th Edition - Clinical stage from 11/13/2018: Stage IVB (cTX, cN2, pM1) - Signed by Truitt Merle, MD on 12/15/2018   11/17/2018 Imaging   11/17/2018 CT CAP IMPRESSION: 1. 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 This is associated with extensive upper abdominal and retroperitoneal lymphadenopathy, as well as metastatic lymphadenopathy in the posterior mediastinum and left supraclavicular region. There is also a metastatic lesion to the left ischium. 2. 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 3. Aortic atherosclerosis, in addition to left main and 3 vessel coronary artery disease. Status post median sternotomy for CABG including LIMA to the LAD. 4. There are calcifications of the aortic valve. Echocardiographic correlation for  evaluation of potential valvular dysfunction may be warranted if clinically indicated.    11/21/2018 Initial Diagnosis   Gallbladder cancer ([place])   11/27/2018 Pathology Results   11/27/2018 CA19-9 immunohistochemical stain Per request, a CA19-9 immunohistochemical stain was performed at an outside institution revealing positive staining in the tumor cells.    12/15/2018 -  Chemotherapy    first line chemo cisplatin and gemcitabine every 2 weeks on 12/15/18   02/20/2019 PET scan   Restaging PET scan:  IMPRESSION: 1. Positive response to therapy at all primary and metastatic sites. Persistent primary and metastatic lesions do have intense hypermetabolic activity albeit reduced. 2. Decrease in size and hypermetabolic activity of mass lesion in the gallbladder fundus. 3. Interval decrease in size and metabolic activity of periportal adenopathy. 4. Decrease in size and metabolic activity of periaortic lymph nodes. 5. Interval decrease in size and metabolic activity of destructive lesion in the LEFT inferior pubic ramus with new sclerosis. 6. No new or progressive disease.   05/29/2019 Imaging   CT CAP W Contrast  IMPRESSION: Known gallbladder adenocarcinoma, difficult to compare to recent PET, improved from prior CT.  Upper abdominal/retroperitoneal lymphadenopathy, slightly improved from prior PET.  Osseous metastasis involving the left inferior pubic ramus, grossly unchanged.  No evidence of metastatic disease in the chest.  Additional ancillary findings as above.   08/22/2019 Imaging   CT CAP W Contrast  IMPRESSION: 1. Stable soft tissue mass involving the gallbladder fundus. 2. Stable mild porta hepatis and retroperitoneal lymphadenopathy. 3. Stable sclerotic bone metastases. 4. No new or progressive metastatic disease identified. 5. Stable 2.3 cm left thyroid lobe nodule.      CURRENT THERAPY:  -First linechemo cisplatin and gemcitabineevery 2 weekson  12/15/18. Had 1 month chemo breakin April, restarted on 04/20/19 -Zometa q12weeks  starting 01/26/19  INTERVAL HISTORY:  Cassie Campbell is here for a follow up and treatment. She presents to the clinic alone. She notes she is doing well and this cycle went better. She had 2 days of manageable diarrhea. Her appetite and eating is adequate. She notes her energy is lower still. She notes she would like to get cortisone shot form orthopedist for her left hip pain. She notes with last scan she had diarrhea accident due to CT contrast.    REVIEW OF SYSTEMS:   Constitutional: Denies fevers, chills or abnormal weight loss (+) Fatigue  Eyes: Denies blurriness of vision Ears, nose, mouth, throat, and face: Denies mucositis or sore throat Respiratory: Denies cough, dyspnea or wheezes Cardiovascular: Denies palpitation, chest discomfort or lower extremity swelling Gastrointestinal:  Denies nausea, heartburn or change in bowel habits Skin: Denies abnormal skin rashes Lymphatics: Denies new lymphadenopathy or easy bruising Neurological:Denies numbness, tingling or new weaknesses Behavioral/Psych: Mood is stable, no new changes  All other systems were reviewed with the patient and are negative.  MEDICAL HISTORY:  Past Medical History:  Diagnosis Date  . Allergic rhinitis   . Arthritis   . Asthma    xolair s 8/05 ?11/07; mastered hfa 12/20/08  . Benign positional vertigo   . CAD (coronary artery disease)    a. 09/2014 NSTEMI s/p LHC with sig 2V dz. dLAD diffusely diseased and not suitable for PCI. unsuccessful RCA PCI d/t heavy calcifications  . gallbladder ca dx'd 10/2018  . GERD (gastroesophageal reflux disease)   . Heart attack ([place])    09/2014  . Hyperlipidemia    <130 ldl pos fm hx, bp  . Hypertension   . Osteopenia    dexa 08/22/07 AP spine + 1.1, left femur -1.3, right femur -.8; dexa 10/06/09 +1.6, left femur =1.6, right femur -.  . PONV (postoperative nausea and vomiting)   . Ruptured  disc, cervical   . 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 BREAST SURGERY  10-26-10   Rt. breast bx--for microcalcifications in rt. retroareolar region--dx was hyalinized fibroadenoma  . BUNIONECTOMY Bilateral   . CARDIAC CATHETERIZATION     2015  . CATARACT EXTRACTION Right 02/02/2018   Dr Satira Sark  . CATARACT EXTRACTION Left 03/30/2018  . [place] SURGERY  12/01  . CORONARY ARTERY BYPASS GRAFT N/A 10/04/2018   Procedure: CORONARY ARTERY BYPASS GRAFTING (CABG) times 2 using left  Internal mammary artery to LAD, left greater saphenous vein - open harvest.;  Surgeon: [name], MD;  Location: [place];  Service: Open Heart Surgery;  Laterality: N/A;  . IR IMAGING GUIDED PORT INSERTION  12/08/2018  . LEFT HEART CATH AND CORONARY ANGIOGRAPHY N/A 09/27/2018   Procedure: LEFT HEART CATH AND CORONARY ANGIOGRAPHY;  Surgeon: Troy Sine, MD;  Location: [place] [facility];  Service: Cardiovascular;  Laterality: N/A;  . LEFT HEART CATHETERIZATION WITH CORONARY ANGIOGRAM N/A 10/16/2014   Procedure: LEFT HEART CATHETERIZATION WITH CORONARY ANGIOGRAM;  Surgeon: Blane Ohara, MD;  Location: [facility] CATH LAB;  Service: Cardiovascular;  Laterality: N/A;  . Left L4-5 transforaminal lumbar interbody fusion with Depuy cage, rods and screws, local and allograft bone graft, Vivigen; bilateral decompression/partial hemilaminectomy lumbar five-sacral one  07/2017  . PERCUTANEOUS CORONARY ROTOBLATOR INTERVENTION (PCI-R) N/A 10/17/2014   Procedure: PERCUTANEOUS CORONARY ROTOBLATOR INTERVENTION (PCI-R);  Surgeon: Troy Sine, MD;  Location[facility] CATH LAB;  Service: Cardiovascular;  Laterality: N/A;  . TEE WITHOUT CARDIOVERSION N/A 10/04/2018   Procedure: TRANSESOPHAGEAL ECHOCARDIOGRAM (TEE);  Surgeon: [name], MD;  Location: [place];  Service: Open Heart Surgery;  Laterality: N/A;  . VEIN  LIGATION AND STRIPPING Left   . VIDEO BRONCHOSCOPY Bilateral 02/05/2015   Procedure: VIDEO BRONCHOSCOPY WITHOUT FLUORO;  Surgeon: Tanda Rockers, MD;  Location: [facility];  Service: Endoscopy;  Laterality: Bilateral;  . VULVA /PERINEUM BIOPSY  12-30-10   --epidermoid cyst    I have reviewed the social history and family history with the patient and they are unchanged from previous note.  ALLERGIES:  is allergic to fish-derived products; penicillins; aspirin; brilinta [ticagrelor]; codeine phosphate; and diphenhydramine hcl.  MEDICATIONS:  Current Outpatient Medications  Medication Sig Dispense Refill  . albuterol (PROAIR HFA) 108 (90 Base) MCG/ACT inhaler INHALE 1 TO 2 PUFFS BY MOUTH EVERY 4 HOURS AS NEEDED FOR WHEEZING 8.5 g 3  . atorvastatin (LIPITOR) 80 MG tablet TAKE 1 TABLET(80 MG) BY MOUTH DAILY 90 tablet 1  . bisoprolol (ZEBETA) 10 MG tablet Take 1 tablet (10 mg total) by mouth daily. 90 tablet 1  . celecoxib (CELEBREX) 200 MG capsule Take 200 mg by mouth 2 (two) times daily as needed.    . cetirizine (ZYRTEC) 10 MG tablet Take 10 mg by mouth at bedtime as needed for allergies.    . cholecalciferol 2000 units TABS Take 1 tablet (2,000 Units total) by mouth daily. 30 tablet 3  . clopidogrel (PLAVIX) 75 MG tablet Take 1 tablet (75 mg total) by mouth daily. 90 tablet 3  . dexamethasone (DECADRON) 4 MG tablet Take 1 tablet (4 mg total) by mouth 2 (two) times daily with a meal. 20 tablet 0  . dextromethorphan-guaiFENesin (MUCINEX DM) 30-600 MG per 12 hr tablet Take 1-2 tablets by mouth every 12 (twelve) hours as needed for cough (with flutter).     . famotidine (PEPCID) 20 MG tablet Take 20 mg by mouth as needed.     . ferrous sulfate 325 (65 FE) MG tablet Take 650 mg by mouth daily with breakfast.     . fluticasone (FLONASE) 50 MCG/ACT nasal spray Place 1-2 sprays into both nostrils 2 (two) times daily as needed for allergies or rhinitis.    Marland Kitchen HYDROcodone-acetaminophen (NORCO/VICODIN)  5-325 MG tablet Take 1 tablet by mouth every 6 (six) hours as needed for moderate pain. 30 tablet 0  . lidocaine-prilocaine (EMLA) cream Apply to affected area once 30 g 3  . magic mouthwash SOLN Take 5 mLs by mouth 4 (four) times daily. Leave Benadryl out of compound since pt allergic to it. Use hydrocortisone & nystatin. Swish & swallow or spit for thrush 240 mL 1  . magnesium oxide (MAGNESIUM-OXIDE) 400 (241.3 Mg) MG tablet Take 1 tablet (400 mg total) by mouth 3 (three) times daily. 90 tablet 1  . meclizine (ANTIVERT) 25 MG tablet TAKE 2 TABLETS BY MOUTH THREE TIMES DAILY AS NEEDED 24 tablet 2  . montelukast (SINGULAIR) 10 MG tablet TAKE 1 TABLET(10 MG) BY MOUTH AT BEDTIME 90 tablet 1  . Multiple Vitamin (MULTIVITAMIN) capsule Take 1 capsule by mouth daily.     Marland Kitchen omeprazole (PRILOSEC) 20 MG capsule Take 20 mg by mouth as needed.     . ondansetron (ZOFRAN) 8 MG tablet Take 1 tablet (8 mg total) by mouth 2 (two) times daily as needed. Start on the third day after chemotherapy. 30 tablet 1  . potassium chloride SA (K-DUR)  20 MEQ tablet TAKE 1 TABLET(20 MEQ) BY MOUTH TWICE DAILY 60 tablet 2  . prochlorperazine (COMPAZINE) 10 MG tablet Take 1 tablet (10 mg total) by mouth every 6 (six) hours as needed (Nausea or vomiting). 30 tablet 1  . SYMBICORT 160-4.5 MCG/ACT inhaler INHALE 2 PUFFS BY MOUTH EVERY 12 HOURS 10.2 g 11  . furosemide (LASIX) 20 MG tablet Take 2 tablets (40 mg total) by mouth daily. 180 tablet 3   No current facility-administered medications for this visit.    Facility-Administered Medications Ordered in Other Visits  Medication Dose Route Frequency Provider Last Rate Last Dose  . heparin lock flush 100 unit/mL  500 Units Intracatheter Once PRN Truitt Merle, MD      . sodium chloride flush (NS) 0.9 % injection 10 mL  10 mL Intracatheter PRN Truitt Merle, MD        PHYSICAL EXAMINATION: ECOG PERFORMANCE STATUS: 1 - Symptomatic but completely ambulatory  Vitals:   10/05/19 0904  BP:  130/75  Pulse: 72  Resp: 17  Temp: 98.2 F (36.8 C)  SpO2: 96%   Filed Weights   10/05/19 0904  Weight: 126 lb 9.6 oz (57.4 kg)    GENERAL:alert, no distress and comfortable SKIN: skin color, texture, turgor are normal, no rashes or significant lesions EYES: normal, Conjunctiva are pink and non-injected, sclera clear  NECK: supple, thyroid normal size, non-tender, without nodularity LYMPH:  no palpable lymphadenopathy in the cervical, axillary  LUNGS: clear to auscultation and percussion with normal breathing effort HEART: regular rate & rhythm and no murmurs and no lower extremity edema ABDOMEN:abdomen soft, non-tender and normal bowel sounds Musculoskeletal:no cyanosis of digits and no clubbing  NEURO: alert & oriented x 3 with fluent speech, no focal motor/sensory deficits  LABORATORY DATA:  I have reviewed the data as listed CBC Latest Ref Rng & Units 10/05/2019 09/21/2019 09/07/2019  WBC 4.0 - 10.5 K/uL 6.0 7.1 5.9  Hemoglobin 12.0 - 15.0 g/dL 9.7(L) 9.6(L) 10.2(L)  Hematocrit 36.0 - 46.0 % 30.4(L) 30.0(L) 31.0(L)  Platelets 150 - 400 K/uL 216 213 203     CMP Latest Ref Rng & Units 10/05/2019 09/21/2019 09/07/2019  Glucose 70 - 99 mg/dL 97 92 99  BUN 8 - 23 mg/dL _0 Creatinine 0.44 - 1.00 mg/dL 1.05(H) 1.01(H) 1.04(H)  Sodium 135 - 145 mmol/L 141 138 140  Potassium 3.5 - 5.1 mmol/L 5.2(H) 4.6 4.6  Chloride 98 - 111 mmol/L 104 104 104  CO2 22 - 32 mmol/L _1 Calcium 8.9 - 10.3 mg/dL 9.6 9.3 9.6  Total Protein 6.5 - 8.1 g/dL 8.1 8.1 8.3(H)  Total Bilirubin 0.3 - 1.2 mg/dL 0.2(L) 0.3 0.4  Alkaline Phos 38 - 126 U/L 84 88 88  AST 15 - 41 U/L _2 ALT 0 - 44 U/L _3 RADIOGRAPHIC STUDIES: I have personally reviewed the radiological images as listed and agreed with the findings in the report. No results found.   ASSESSMENT & PLAN:  Cassie Campbell is a 78 y.o. 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 Tolerating well. -[employer] testing results showed MSI stable disease, low tumor mutation burden,notargetable mutation such asFGFR fusion or IDH mutation, but (+)  HER2 amplification, may try Herceptin in the future. -Her CT CAP from 08/22/19 showsstable disease. Her chemo is still controlling her disease currently and will continue current regimen until no longer tolerable or she has disease progression. She is fine to take chemo breakas needed.  -Her tumor marker is still trending up and may indicate early resistance.I again discussed the option of change her current cisplatin/gem to weekly 2 weeks on and one week off, shedoes not think she can tolerate and declined. -S/p cycle 9 her side effects have accumulated and experienced significant fatigue, diarrhea, nausea and mild SOB.With dose reduction she has been tolerating Gem/Cis better.  -Labs reviewed, CBC and CMP WNL except Hg 9.7, HCT 30.4, MCV 105.6. Overall adequate to proceed with Gem/Cis at same dose.  -Plan to scan her in 11/2019. Will scan sooner if tumor marker significantly increases. If not significant disease progression on scan, will continue current regimen.  -she has been getting iv magnesium 5g with hydration for chemo, mag normal today, will reduced to 4g in future  -f/uin2weeks.  -CA 19.9 has been trending up, may need repeat CT scan sooner than 3 months   2. CAD, S/P CABGX2 on 09/27/2018, EF 40-45% -Currently on Plavix. She also takes lasix during chemo. -f/u with Dr. Claiborne Billings -stable,she appears to be euvolemic  3. Asthma, dyspnea  -Currently on Symbicort, Singulair, and albuterol as needed -f/u with pulmonary, stable, no recent flare -She has recently  been little more dyspneic. She can use dexa after chemo, and as needed for worsening wheezing   4.Goal of care discussion -The patient understands the goal of care is palliative. -she is full code for now  5.Hypokalemiaand hypomagnesemia -Likely secondary to lasix. Currently on KCL 22mqdailyand mag-ox 4035mid -Due topersistent hypomagnesemiashe is onoral magTID -K 5.2 today, will not give iv K in hydration as usual   6. Mild Anemia -Secondary to chemoand her recent open heart surgery -Hg at9.7 today (10/05/19), slightly worse.  -I discussed her anemia may contribute to her mild SOB. -She has oral iron but has not been taking daily because it causes bloating. I encouraged her to take prenatal vitamin to help her iron level. She is agreeable   7.Chronic left upper leg pain -likelymusculoskeletalat this point and related to prior surgeries.Tolerable, She is still functional -She uses Celebrex and Tylenol.  -I encouraged her to discuss Celebrex use with her cardiologist. She can continue Tylenol for now.   8. Acid Reflux -Not controlled on Pepcid. I recommend she try Prilosec which is stronger. She is willing to try   Plan -Labs reviewed and adequate to proceed with gem/Cis today, iv hydration with magnesium 4g, no K in hydration today and stop oral KCL  -continueZometaevery3 months, next in 10/2019 -lab, flush, f/u and gem/Cisin2 and 4weeks -will f/u CA19.9 result   No problem-specific Assessment & Plan notes found for this encounter.   Orders Placed This Encounter  Procedures  . Magnesium    Standing Status:   Standing    Number of Occurrences:   50    Standing Expiration Date:   10/04/2020   All questions were answered. The patient knows to call the clinic with any problems, questions or concerns. No barriers to learning was detected. I spent 20 minutes counseling the patient face to face. The total time spent in the appointment was 25  minutes and more than 50% was on counseling and review of test results     YaTruitt MerleMD 10/05/2019   I,  Joslyn Devon, am acting as scribe for Truitt Merle, MD.   I have reviewed the above documentation for accuracy and completeness, and I agree with the above.

## 2019-10-05 ENCOUNTER — Inpatient Hospital Stay: Payer: Medicare Other

## 2019-10-05 ENCOUNTER — Inpatient Hospital Stay (HOSPITAL_BASED_OUTPATIENT_CLINIC_OR_DEPARTMENT_OTHER): Payer: Medicare Other | Admitting: Hematology

## 2019-10-05 ENCOUNTER — Encounter: Payer: Self-pay | Admitting: Hematology

## 2019-10-05 ENCOUNTER — Other Ambulatory Visit: Payer: Self-pay | Admitting: *Deleted

## 2019-10-05 ENCOUNTER — Other Ambulatory Visit: Payer: Self-pay

## 2019-10-05 VITALS — BP 130/75 | HR 72 | Temp 98.2°F | Resp 17 | Ht 59.0 in | Wt 126.6 lb

## 2019-10-05 DIAGNOSIS — Z7189 Other specified counseling: Secondary | ICD-10-CM

## 2019-10-05 DIAGNOSIS — I10 Essential (primary) hypertension: Secondary | ICD-10-CM | POA: Diagnosis not present

## 2019-10-05 DIAGNOSIS — C23 Malignant neoplasm of gallbladder: Secondary | ICD-10-CM

## 2019-10-05 DIAGNOSIS — Z5111 Encounter for antineoplastic chemotherapy: Secondary | ICD-10-CM | POA: Diagnosis not present

## 2019-10-05 LAB — CBC WITH DIFFERENTIAL (CANCER CENTER ONLY)
Abs Immature Granulocytes: 0.03 10*3/uL (ref 0.00–0.07)
Basophils Absolute: 0 10*3/uL (ref 0.0–0.1)
Basophils Relative: 1 %
Eosinophils Absolute: 0.6 10*3/uL — ABNORMAL HIGH (ref 0.0–0.5)
Eosinophils Relative: 10 %
HCT: 30.4 % — ABNORMAL LOW (ref 36.0–46.0)
Hemoglobin: 9.7 g/dL — ABNORMAL LOW (ref 12.0–15.0)
Immature Granulocytes: 1 %
Lymphocytes Relative: 17 %
Lymphs Abs: 1 10*3/uL (ref 0.7–4.0)
MCH: 33.7 pg (ref 26.0–34.0)
MCHC: 31.9 g/dL (ref 30.0–36.0)
MCV: 105.6 fL — ABNORMAL HIGH (ref 80.0–100.0)
Monocytes Absolute: 0.8 10*3/uL (ref 0.1–1.0)
Monocytes Relative: 12 %
Neutro Abs: 3.6 10*3/uL (ref 1.7–7.7)
Neutrophils Relative %: 59 %
Platelet Count: 216 10*3/uL (ref 150–400)
RBC: 2.88 MIL/uL — ABNORMAL LOW (ref 3.87–5.11)
RDW: 18.6 % — ABNORMAL HIGH (ref 11.5–15.5)
WBC Count: 6 10*3/uL (ref 4.0–10.5)
nRBC: 0 % (ref 0.0–0.2)

## 2019-10-05 LAB — CMP (CANCER CENTER ONLY)
ALT: 15 U/L (ref 0–44)
AST: 30 U/L (ref 15–41)
Albumin: 3.6 g/dL (ref 3.5–5.0)
Alkaline Phosphatase: 84 U/L (ref 38–126)
Anion gap: 13 (ref 5–15)
BUN: 15 mg/dL (ref 8–23)
CO2: 24 mmol/L (ref 22–32)
Calcium: 9.6 mg/dL (ref 8.9–10.3)
Chloride: 104 mmol/L (ref 98–111)
Creatinine: 1.05 mg/dL — ABNORMAL HIGH (ref 0.44–1.00)
GFR, Est AFR Am: 59 mL/min — ABNORMAL LOW (ref >60)
GFR, Estimated: 51 mL/min — ABNORMAL LOW (ref >60)
Glucose, Bld: 97 mg/dL (ref 70–99)
Potassium: 5.2 mmol/L — ABNORMAL HIGH (ref 3.5–5.1)
Sodium: 141 mmol/L (ref 135–145)
Total Bilirubin: 0.2 mg/dL — ABNORMAL LOW (ref 0.3–1.2)
Total Protein: 8.1 g/dL (ref 6.5–8.1)

## 2019-10-05 LAB — MAGNESIUM: Magnesium: 2 mg/dL (ref 1.7–2.4)

## 2019-10-05 MED ORDER — PALONOSETRON HCL INJECTION 0.25 MG/5ML
INTRAVENOUS | Status: AC
  Filled 2019-10-05: qty 5

## 2019-10-05 MED ORDER — FUROSEMIDE 10 MG/ML IJ SOLN
20.0000 mg | Freq: Once | INTRAMUSCULAR | Status: AC
  Administered 2019-10-05: 20 mg via INTRAVENOUS

## 2019-10-05 MED ORDER — SODIUM CHLORIDE 0.9% FLUSH
10.0000 mL | INTRAVENOUS | Status: DC | PRN
  Administered 2019-10-05: 10 mL
  Filled 2019-10-05: qty 10

## 2019-10-05 MED ORDER — SODIUM CHLORIDE 0.9 % IV SOLN
Freq: Once | INTRAVENOUS | Status: AC
  Administered 2019-10-05: 13:00:00 via INTRAVENOUS
  Filled 2019-10-05: qty 5

## 2019-10-05 MED ORDER — SODIUM CHLORIDE 0.9 % IV SOLN
20.0000 mg/m2 | Freq: Once | INTRAVENOUS | Status: AC
  Administered 2019-10-05: 31 mg via INTRAVENOUS
  Filled 2019-10-05: qty 31

## 2019-10-05 MED ORDER — PALONOSETRON HCL INJECTION 0.25 MG/5ML
0.2500 mg | Freq: Once | INTRAVENOUS | Status: AC
  Administered 2019-10-05: 0.25 mg via INTRAVENOUS

## 2019-10-05 MED ORDER — HEPARIN SOD (PORK) LOCK FLUSH 100 UNIT/ML IV SOLN
500.0000 [IU] | Freq: Once | INTRAVENOUS | Status: AC | PRN
  Administered 2019-10-05: 17:00:00 500 [IU]
  Filled 2019-10-05: qty 5

## 2019-10-05 MED ORDER — MAGNESIUM SULFATE 50 % IJ SOLN
Freq: Once | INTRAVENOUS | Status: AC
  Administered 2019-10-05: 11:00:00 via INTRAVENOUS
  Filled 2019-10-05: qty 1000

## 2019-10-05 MED ORDER — FUROSEMIDE 10 MG/ML IJ SOLN
INTRAMUSCULAR | Status: AC
  Filled 2019-10-05: qty 2

## 2019-10-05 MED ORDER — SODIUM CHLORIDE 0.9% FLUSH
10.0000 mL | Freq: Once | INTRAVENOUS | Status: AC | PRN
  Administered 2019-10-05: 10 mL
  Filled 2019-10-05: qty 10

## 2019-10-05 MED ORDER — SODIUM CHLORIDE 0.9 % IV SOLN
800.0000 mg/m2 | Freq: Once | INTRAVENOUS | Status: AC
  Administered 2019-10-05: 1216 mg via INTRAVENOUS
  Filled 2019-10-05: qty 31.98

## 2019-10-05 MED ORDER — SODIUM CHLORIDE 0.9 % IV SOLN
Freq: Once | INTRAVENOUS | Status: AC
  Administered 2019-10-05: 10:00:00 via INTRAVENOUS
  Filled 2019-10-05: qty 250

## 2019-10-05 NOTE — Progress Notes (Signed)
10/05/19  Remove Potassium chloride from IV fluids with K+ = 5.2  Decrease magnesium sulfate in cisplatin hydration to 4 gm beginning with next dose of chemotherapy.  Plan updated.  T.O. Dr Lavonda Jumbo, PharmD

## 2019-10-05 NOTE — Patient Instructions (Signed)
[place] Discharge Instructions for Patients Receiving Chemotherapy  Today you received the following chemotherapy agents:  Cisplatin and Gemcitibine.  To help prevent nausea and vomiting after your treatment, we encourage you to take your nausea medication as prescribed.   If you develop nausea and vomiting that is not controlled by your nausea medication, call the clinic.   BELOW ARE SYMPTOMS THAT SHOULD BE REPORTED IMMEDIATELY:  *FEVER GREATER THAN 100.5 F  *CHILLS WITH OR WITHOUT FEVER  NAUSEA AND VOMITING THAT IS NOT CONTROLLED WITH YOUR NAUSEA MEDICATION  *UNUSUAL SHORTNESS OF BREATH  *UNUSUAL BRUISING OR BLEEDING  TENDERNESS IN MOUTH AND THROAT WITH OR WITHOUT PRESENCE OF ULCERS  *URINARY PROBLEMS  *BOWEL PROBLEMS  UNUSUAL RASH Items with * indicate a potential emergency and should be followed up as soon as possible.  Feel free to call the clinic should you have any questions or concerns. The clinic phone number is [phone].  Please show the [facility] at check-in to the Emergency Department and triage nurse.

## 2019-10-06 LAB — CANCER ANTIGEN 19-9: CA 19-9: 3082 U/mL — ABNORMAL HIGH (ref 0–35)

## 2019-10-08 ENCOUNTER — Telehealth: Payer: Self-pay | Admitting: Hematology

## 2019-10-08 NOTE — Telephone Encounter (Signed)
No los per 10/16.

## 2019-10-09 ENCOUNTER — Other Ambulatory Visit: Payer: Self-pay | Admitting: Physician Assistant

## 2019-10-11 ENCOUNTER — Telehealth: Payer: Self-pay

## 2019-10-11 NOTE — Telephone Encounter (Signed)
Patient called requesting to take the week of 10/18/2019 off from treatment.  Feels her body needs some more time to recover from this last treatment.  Consulted with Cira Rue NP and she agreed it would be okay.   I called the patient back and informed her I will cancel her appointments for next week and she will resume on 11/02/2019.  She was appreciative of the call and verbalized an understanding.

## 2019-10-18 ENCOUNTER — Ambulatory Visit: Payer: Medicare Other

## 2019-10-18 ENCOUNTER — Ambulatory Visit: Payer: Medicare Other | Admitting: Hematology

## 2019-10-18 ENCOUNTER — Other Ambulatory Visit: Payer: Medicare Other

## 2019-10-31 NOTE — Progress Notes (Signed)
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 Lymph node, biopsy, mediastinal - LYMPH NODE WITH METASTATIC ADENOCARCINOMA. - SEE MICROSCOPIC DESCRIPTION 2. Plaque, coronary artery - CALCIFIED ATHEROSCLEROTIC PLAQUE.   10/14/2018 Miscellaneous   Foundation One:  MSI stable tumor mutation burden 3Muts/mb ERBB2 amplification CCNE1 amplification TP53 mutation(+) CDK6 amplification  HGF amplification    11/13/2018 Cancer Staging   Staging form: Gallbladder, AJCC 8th Edition - Clinical stage from 11/13/2018: Stage IVB (cTX, cN2, pM1) - Signed by Truitt Merle, MD on 12/15/2018   11/17/2018 Imaging   11/17/2018 CT CAP IMPRESSION: 1. 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 This is associated with extensive upper abdominal and retroperitoneal lymphadenopathy, as well as metastatic lymphadenopathy in the posterior mediastinum and left supraclavicular region. There is also a metastatic lesion to the left ischium. 2. 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 3. Aortic atherosclerosis, in addition to left main and 3 vessel coronary artery disease. Status post median sternotomy for CABG including LIMA to the LAD. 4. There are calcifications of the aortic valve. Echocardiographic correlation for  evaluation of potential valvular dysfunction may be warranted if clinically indicated.    11/21/2018 Initial Diagnosis   Gallbladder cancer ([place])   11/27/2018 Pathology Results   11/27/2018 CA19-9 immunohistochemical stain Per request, a CA19-9 immunohistochemical stain was performed at an outside institution revealing positive staining in the tumor cells.    12/15/2018 -  Chemotherapy    first line chemo cisplatin and gemcitabine every 2 weeks on 12/15/18   02/20/2019 PET scan   Restaging PET scan:  IMPRESSION: 1. Positive response to therapy at all primary and metastatic sites. Persistent primary and metastatic lesions do have intense hypermetabolic activity albeit reduced. 2. Decrease in size and hypermetabolic activity of mass lesion in the gallbladder fundus. 3. Interval decrease in size and metabolic activity of periportal adenopathy. 4. Decrease in size and metabolic activity of periaortic lymph nodes. 5. Interval decrease in size and metabolic activity of destructive lesion in the LEFT inferior pubic ramus with new sclerosis. 6. No new or progressive disease.   05/29/2019 Imaging   CT CAP W Contrast  IMPRESSION: Known gallbladder adenocarcinoma, difficult to compare to recent PET, improved from prior CT.  Upper abdominal/retroperitoneal lymphadenopathy, slightly improved from prior PET.  Osseous metastasis involving the left inferior pubic ramus, grossly unchanged.  No evidence of metastatic disease in the chest.  Additional ancillary findings as above.   08/22/2019 Imaging   CT CAP W Contrast  IMPRESSION: 1. Stable soft tissue mass involving the gallbladder fundus. 2. Stable mild porta hepatis and retroperitoneal lymphadenopathy. 3. Stable sclerotic bone metastases. 4. No new or progressive metastatic disease identified. 5. Stable 2.3 cm left thyroid lobe nodule.      CURRENT THERAPY:  -First linechemo cisplatin and gemcitabineevery 2 weekson  12/15/18. Had 1 month chemo breakin April, restarted on 04/20/19 -Zometa q12weeks  starting 01/26/19  INTERVAL HISTORY:  Cassie Campbell is here for a follow up and treatment. She presents to the clinic alone. She skipped one cycle chemo 2 weeks ago.   More fatigued lately, able to function well at home. Slightly worse dyspnea on exertion, stop when she walks every 25-50 feet. No chest pain.  Left hip ache for 2 years, stable  No abdominal pain or other discomfort  Appetite slightly low, due ot poor teast, better since yesterday, lost 3-4 lbs since last visit  The rest review of systems negative   MEDICAL HISTORY:  Past Medical History:  Diagnosis Date  . Allergic rhinitis   . Arthritis   . Asthma    xolair s 8/05 ?11/07; mastered hfa 12/20/08  . Benign positional vertigo   . CAD (coronary artery disease)    a. 09/2014 NSTEMI s/p LHC with sig 2V dz. dLAD diffusely diseased and not suitable for PCI. unsuccessful RCA PCI d/t heavy calcifications  . gallbladder ca dx'd 10/2018  . GERD (gastroesophageal reflux disease)   . Heart attack ([place])    09/2014  . Hyperlipidemia    <130 ldl pos fm hx, bp  . Hypertension   . Osteopenia    dexa 08/22/07 AP spine + 1.1, left femur -1.3, right femur -.8; dexa 10/06/09 +1.6, left femur =1.6, right femur -.  . PONV (postoperative nausea and vomiting)   . Ruptured disc, cervical   . 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 BREAST SURGERY  10-26-10   Rt. breast bx--for microcalcifications in rt. retroareolar region--dx was hyalinized fibroadenoma  . BUNIONECTOMY Bilateral   . CARDIAC CATHETERIZATION     2015  . CATARACT EXTRACTION Right 02/02/2018   Dr Satira Sark  . CATARACT EXTRACTION Left 03/30/2018  . [place] SURGERY  12/01  . CORONARY ARTERY BYPASS GRAFT N/A 10/04/2018   Procedure: CORONARY ARTERY BYPASS GRAFTING (CABG) times 2 using left   Internal mammary artery to LAD, left greater saphenous vein - open harvest.;  Surgeon: [name], MD;  Location: [facility];  Service: Open Heart Surgery;  Laterality: N/A;  . IR IMAGING GUIDED PORT INSERTION  12/08/2018  . LEFT HEART CATH AND CORONARY ANGIOGRAPHY N/A 09/27/2018   Procedure: LEFT HEART CATH AND CORONARY ANGIOGRAPHY;  Surgeon: Troy Sine, MD;  Location: [facility];  Service: Cardiovascular;  Laterality: N/A;  . LEFT HEART CATHETERIZATION WITH CORONARY ANGIOGRAM N/A 10/16/2014   Procedure: LEFT HEART CATHETERIZATION WITH CORONARY ANGIOGRAM;  Surgeon: Blane Ohara, MD;  Location: [facility] CATH LAB;  Service: Cardiovascular;  Laterality: N/A;  . Left L4-5 transforaminal lumbar interbody fusion with Depuy cage, rods and screws, local and allograft bone graft, Vivigen; bilateral decompression/partial hemilaminectomy lumbar five-sacral one  07/2017  . PERCUTANEOUS CORONARY ROTOBLATOR INTERVENTION (PCI-R) N/A 10/17/2014   Procedure: PERCUTANEOUS CORONARY ROTOBLATOR INTERVENTION (PCI-R);  Surgeon: Troy Sine, MD;  Location: [facility] CATH LAB;  Service: Cardiovascular;  Laterality: N/A;  . TEE WITHOUT CARDIOVERSION N/A 10/04/2018   Procedure: TRANSESOPHAGEAL ECHOCARDIOGRAM (TEE);  Surgeon: [name], MD;  Location: [place];  Service: Open Heart Surgery;  Laterality: N/A;  . VEIN LIGATION AND STRIPPING Left   . VIDEO BRONCHOSCOPY Bilateral 02/05/2015   Procedure: VIDEO BRONCHOSCOPY WITHOUT FLUORO;  Surgeon: Tanda Rockers, MD;  Location: [facility];  Service: Endoscopy;  Laterality: Bilateral;  . VULVA /PERINEUM BIOPSY  12-30-10   --epidermoid  cyst    I have reviewed the social history and family history with the patient and they are unchanged from previous note.  ALLERGIES:  is allergic to fish-derived products; penicillins; aspirin; brilinta [ticagrelor]; codeine phosphate; and diphenhydramine hcl.  MEDICATIONS:  Current Outpatient Medications  Medication Sig  Dispense Refill  . albuterol (PROAIR HFA) 108 (90 Base) MCG/ACT inhaler INHALE 1 TO 2 PUFFS BY MOUTH EVERY 4 HOURS AS NEEDED FOR WHEEZING 8.5 g 3  . atorvastatin (LIPITOR) 80 MG tablet TAKE 1 TABLET(80 MG) BY MOUTH DAILY 90 tablet 1  . bisoprolol (ZEBETA) 10 MG tablet TAKE 1 TABLET(10 MG) BY MOUTH DAILY 90 tablet 1  . celecoxib (CELEBREX) 200 MG capsule Take 200 mg by mouth 2 (two) times daily as needed.    . cetirizine (ZYRTEC) 10 MG tablet Take 10 mg by mouth at bedtime as needed for allergies.    . cholecalciferol 2000 units TABS Take 1 tablet (2,000 Units total) by mouth daily. 30 tablet 3  . clopidogrel (PLAVIX) 75 MG tablet Take 1 tablet (75 mg total) by mouth daily. 90 tablet 3  . dexamethasone (DECADRON) 4 MG tablet Take 1 tablet (4 mg total) by mouth 2 (two) times daily with a meal. 20 tablet 0  . dextromethorphan-guaiFENesin (MUCINEX DM) 30-600 MG per 12 hr tablet Take 1-2 tablets by mouth every 12 (twelve) hours as needed for cough (with flutter).     . famotidine (PEPCID) 20 MG tablet Take 20 mg by mouth as needed.     . ferrous sulfate 325 (65 FE) MG tablet Take 650 mg by mouth daily with breakfast.     . fluticasone (FLONASE) 50 MCG/ACT nasal spray Place 1-2 sprays into both nostrils 2 (two) times daily as needed for allergies or rhinitis.    Marland Kitchen HYDROcodone-acetaminophen (NORCO/VICODIN) 5-325 MG tablet Take 1 tablet by mouth every 6 (six) hours as needed for moderate pain. 30 tablet 0  . lidocaine-prilocaine (EMLA) cream Apply to affected area once 30 g 3  . magic mouthwash SOLN Take 5 mLs by mouth 4 (four) times daily. Leave Benadryl out of compound since pt allergic to it. Use hydrocortisone & nystatin. Swish & swallow or spit for thrush 240 mL 1  . magnesium oxide (MAGNESIUM-OXIDE) 400 (241.3 Mg) MG tablet Take 1 tablet (400 mg total) by mouth 3 (three) times daily. 90 tablet 1  . meclizine (ANTIVERT) 25 MG tablet TAKE 2 TABLETS BY MOUTH THREE TIMES DAILY AS NEEDED 24 tablet 2  .  montelukast (SINGULAIR) 10 MG tablet TAKE 1 TABLET(10 MG) BY MOUTH AT BEDTIME 90 tablet 1  . Multiple Vitamin (MULTIVITAMIN) capsule Take 1 capsule by mouth daily.     Marland Kitchen omeprazole (PRILOSEC) 20 MG capsule Take 20 mg by mouth as needed.     . ondansetron (ZOFRAN) 8 MG tablet Take 1 tablet (8 mg total) by mouth 2 (two) times daily as needed. Start on the third day after chemotherapy. 30 tablet 1  . prochlorperazine (COMPAZINE) 10 MG tablet Take 1 tablet (10 mg total) by mouth every 6 (six) hours as needed (Nausea or vomiting). 30 tablet 1  . SYMBICORT 160-4.5 MCG/ACT inhaler INHALE 2 PUFFS BY MOUTH EVERY 12 HOURS 10.2 g 11  . furosemide (LASIX) 20 MG tablet Take 2 tablets (40 mg total) by mouth daily. 180 tablet 3  . potassium chloride SA (K-DUR) 20 MEQ tablet TAKE 1 TABLET(20 MEQ) BY MOUTH TWICE DAILY (Patient not taking: Reported on 11/02/2019) 60 tablet 2  No current facility-administered medications for this visit.    Facility-Administered Medications Ordered in Other Visits  Medication Dose Route Frequency Provider Last Rate Last Dose  . 0.9 %  sodium chloride infusion   Intravenous Once Truitt Merle, MD      . CISplatin (PLATINOL) 31 mg in sodium chloride 0.9 % 250 mL chemo infusion  20 mg/m2 (Treatment Plan Recorded) Intravenous Once Truitt Merle, MD      . fosaprepitant (EMEND) 150 mg, dexamethasone (DECADRON) 12 mg in sodium chloride 0.9 % 145 mL IVPB   Intravenous Once Truitt Merle, MD      . furosemide (LASIX) injection 20 mg  20 mg Intravenous Once Truitt Merle, MD      . gemcitabine (GEMZAR) 1,216 mg in sodium chloride 0.9 % 250 mL chemo infusion  800 mg/m2 (Treatment Plan Recorded) Intravenous Once Truitt Merle, MD      . heparin lock flush 100 unit/mL  500 Units Intracatheter Once PRN Truitt Merle, MD      . palonosetron (ALOXI) injection 0.25 mg  0.25 mg Intravenous Once Truitt Merle, MD      . sodium chloride flush (NS) 0.9 % injection 10 mL  10 mL Intracatheter PRN Truitt Merle, MD      . Zoledronic  Acid (ZOMETA) IVPB 4 mg  4 mg Intravenous Once Truitt Merle, MD        PHYSICAL EXAMINATION: ECOG PERFORMANCE STATUS: 2 - Symptomatic, <50% confined to bed  Vitals:   11/02/19 0826  BP: 122/68  Pulse: 79  Resp: 17  Temp: 98.1 F (36.7 C)  SpO2: 99%   Filed Weights   11/02/19 0826  Weight: 123 lb 3.2 oz (55.9 kg)   GENERAL:alert, no distress and comfortable SKIN: skin color, texture, turgor are normal, no rashes or significant lesions EYES: normal, Conjunctiva are pink and non-injected, sclera clear NECK: supple, thyroid normal size, non-tender, without nodularity LYMPH:  no palpable lymphadenopathy in the cervical, axillary  LUNGS: clear to auscultation and percussion with normal breathing effort HEART: regular rate & rhythm and no murmurs and no lower extremity edema ABDOMEN:abdomen soft, non-tender and normal bowel sounds Musculoskeletal:no cyanosis of digits and no clubbing  NEURO: alert & oriented x 3 with fluent speech, no focal motor/sensory deficits  LABORATORY DATA:  I have reviewed the data as listed CBC Latest Ref Rng & Units 11/02/2019 10/05/2019 09/21/2019  WBC 4.0 - 10.5 K/uL 9.1 6.0 7.1  Hemoglobin 12.0 - 15.0 g/dL 10.9(L) 9.7(L) 9.6(L)  Hematocrit 36.0 - 46.0 % 34.0(L) 30.4(L) 30.0(L)  Platelets 150 - 400 K/uL 426(H) 216 213     CMP Latest Ref Rng & Units 11/02/2019 10/05/2019 09/21/2019  Glucose 70 - 99 mg/dL 136(H) 97 92  BUN 8 - 23 mg/dL 16 15 15   Creatinine 0.44 - 1.00 mg/dL 1.05(H) 1.05(H) 1.01(H)  Sodium 135 - 145 mmol/L 139 141 138  Potassium 3.5 - 5.1 mmol/L 3.7 5.2(H) 4.6  Chloride 98 - 111 mmol/L 99 104 104  CO2 22 - 32 mmol/L 24 24 25   Calcium 8.9 - 10.3 mg/dL 9.6 9.6 9.3  Total Protein 6.5 - 8.1 g/dL 9.0(H) 8.1 8.1  Total Bilirubin 0.3 - 1.2 mg/dL 0.4 0.2(L) 0.3  Alkaline Phos 38 - 126 U/L 95 84 88  AST 15 - 41 U/L 26 30 28   ALT 0 - 44 U/L 11 15 15       RADIOGRAPHIC STUDIES: I have personally reviewed the radiological images as listed  and agreed with the findings in  the report. No results found.   ASSESSMENT & PLAN:  Cassie Campbell is a 78 y.o. 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 Tolerating well. -She is also on Zometa q60month starting 01/26/19 -S/p C10 Cis/Gem she is more fatigued, and I reduced her chemo dose by 20%, chemo break last time, no much improvement. -Lab reviewed, will proceed chemo cisplatin and gemcitabine today at the same dose.  Patient agrees to continue every 2 weeks -Her tumor marker CA 19.9 is rising lately, I recommend her to get a restaging scan next month, she would like to postpone to January after the holidays -If she progressed on neck scan, Her2 antibodies Herceptin and Perjeta, with or wo Xeloda  -will see her back in 2 weeks    2. CAD, S/P CABGX2 on 09/27/2018, EF 40-45% -Currently on Plavix. She also takes lasix during chemo. -f/u with Dr. KClaiborne Billings-stable,she appears to be euvolemic  3. Asthma, dyspnea -Currently on Symbicort, Singulair, and albuterol as needed -f/u with pulmonary, stable, no recent flare -She has recently beenlittle more dyspneic. She can use dexa after chemo, and as needed for worsening wheezing -lung exam normal today   4.Goal of care discussion -The patient understands the goal of care is palliative. -she is full code for now  5. Mild Anemia -Secondary to chemoand her recent open heart surgery -I discussed her anemia may contribute to her mild SOB. -She has not taken oral iron because it causes bloating. I encouraged her to take prenatal vitamin.  -improved after chemo break   6.Chronic left upper leg pain -likelymusculoskeletalat this point and related to prior  surgeries.Tolerable, She is still functional -on Tylenol as needed -She plans to see her orthopedic surgeon   Plan -Lab reviewed, adequate for treatment, will proceed with cisplatin and gemcitabine at same reduced dose today -Continue chemo and follow-up every 2 weeks -plan to repeat CT scan in Jan    No problem-specific Assessment & Plan notes found for this encounter.   No orders of the defined types were placed in this encounter.  All questions were answered. The patient knows to call the clinic with any problems, questions or concerns. No barriers to learning was detected. I spent 20 minutes counseling the patient face to face. The total time spent in the appointment was 25 minutes and more than 50% was on counseling and review of test results     YTruitt Merle MD 11/02/2019   I, AJoslyn Devon am acting as scribe for YTruitt Merle MD.   I have reviewed the above documentation for accuracy and completeness, and I agree with the above.

## 2019-11-02 ENCOUNTER — Inpatient Hospital Stay: Payer: Medicare Other

## 2019-11-02 ENCOUNTER — Inpatient Hospital Stay (HOSPITAL_BASED_OUTPATIENT_CLINIC_OR_DEPARTMENT_OTHER): Payer: Medicare Other | Admitting: Hematology

## 2019-11-02 ENCOUNTER — Encounter: Payer: Self-pay | Admitting: Hematology

## 2019-11-02 ENCOUNTER — Inpatient Hospital Stay: Payer: Medicare Other | Attending: Obstetrics

## 2019-11-02 ENCOUNTER — Other Ambulatory Visit: Payer: Self-pay

## 2019-11-02 VITALS — BP 122/68 | HR 79 | Temp 98.1°F | Resp 17 | Ht 59.0 in | Wt 123.2 lb

## 2019-11-02 DIAGNOSIS — Z5111 Encounter for antineoplastic chemotherapy: Secondary | ICD-10-CM | POA: Diagnosis not present

## 2019-11-02 DIAGNOSIS — Z452 Encounter for adjustment and management of vascular access device: Secondary | ICD-10-CM | POA: Insufficient documentation

## 2019-11-02 DIAGNOSIS — C771 Secondary and unspecified malignant neoplasm of intrathoracic lymph nodes: Secondary | ICD-10-CM | POA: Diagnosis not present

## 2019-11-02 DIAGNOSIS — D6481 Anemia due to antineoplastic chemotherapy: Secondary | ICD-10-CM | POA: Insufficient documentation

## 2019-11-02 DIAGNOSIS — I10 Essential (primary) hypertension: Secondary | ICD-10-CM | POA: Diagnosis not present

## 2019-11-02 DIAGNOSIS — Z7189 Other specified counseling: Secondary | ICD-10-CM

## 2019-11-02 DIAGNOSIS — C23 Malignant neoplasm of gallbladder: Secondary | ICD-10-CM

## 2019-11-02 DIAGNOSIS — C7951 Secondary malignant neoplasm of bone: Secondary | ICD-10-CM | POA: Diagnosis not present

## 2019-11-02 LAB — CMP (CANCER CENTER ONLY)
ALT: 11 U/L (ref 0–44)
AST: 26 U/L (ref 15–41)
Albumin: 3.5 g/dL (ref 3.5–5.0)
Alkaline Phosphatase: 95 U/L (ref 38–126)
Anion gap: 16 — ABNORMAL HIGH (ref 5–15)
BUN: 16 mg/dL (ref 8–23)
CO2: 24 mmol/L (ref 22–32)
Calcium: 9.6 mg/dL (ref 8.9–10.3)
Chloride: 99 mmol/L (ref 98–111)
Creatinine: 1.05 mg/dL — ABNORMAL HIGH (ref 0.44–1.00)
GFR, Est AFR Am: 59 mL/min — ABNORMAL LOW (ref >60)
GFR, Estimated: 51 mL/min — ABNORMAL LOW (ref >60)
Glucose, Bld: 136 mg/dL — ABNORMAL HIGH (ref 70–99)
Potassium: 3.7 mmol/L (ref 3.5–5.1)
Sodium: 139 mmol/L (ref 135–145)
Total Bilirubin: 0.4 mg/dL (ref 0.3–1.2)
Total Protein: 9 g/dL — ABNORMAL HIGH (ref 6.5–8.1)

## 2019-11-02 LAB — CBC WITH DIFFERENTIAL (CANCER CENTER ONLY)
Abs Immature Granulocytes: 0.06 10*3/uL (ref 0.00–0.07)
Basophils Absolute: 0.1 10*3/uL (ref 0.0–0.1)
Basophils Relative: 1 %
Eosinophils Absolute: 0.9 10*3/uL — ABNORMAL HIGH (ref 0.0–0.5)
Eosinophils Relative: 9 %
HCT: 34 % — ABNORMAL LOW (ref 36.0–46.0)
Hemoglobin: 10.9 g/dL — ABNORMAL LOW (ref 12.0–15.0)
Immature Granulocytes: 1 %
Lymphocytes Relative: 12 %
Lymphs Abs: 1.1 10*3/uL (ref 0.7–4.0)
MCH: 33.4 pg (ref 26.0–34.0)
MCHC: 32.1 g/dL (ref 30.0–36.0)
MCV: 104.3 fL — ABNORMAL HIGH (ref 80.0–100.0)
Monocytes Absolute: 0.8 10*3/uL (ref 0.1–1.0)
Monocytes Relative: 9 %
Neutro Abs: 6.2 10*3/uL (ref 1.7–7.7)
Neutrophils Relative %: 68 %
Platelet Count: 426 10*3/uL — ABNORMAL HIGH (ref 150–400)
RBC: 3.26 MIL/uL — ABNORMAL LOW (ref 3.87–5.11)
RDW: 16.2 % — ABNORMAL HIGH (ref 11.5–15.5)
WBC Count: 9.1 10*3/uL (ref 4.0–10.5)
nRBC: 0 % (ref 0.0–0.2)

## 2019-11-02 LAB — MAGNESIUM: Magnesium: 2.1 mg/dL (ref 1.7–2.4)

## 2019-11-02 MED ORDER — ZOLEDRONIC ACID 4 MG/100ML IV SOLN
4.0000 mg | Freq: Once | INTRAVENOUS | Status: AC
  Administered 2019-11-02: 12:00:00 4 mg via INTRAVENOUS
  Filled 2019-11-02: qty 100

## 2019-11-02 MED ORDER — SODIUM CHLORIDE 0.9 % IV SOLN
Freq: Once | INTRAVENOUS | Status: AC
  Administered 2019-11-02: 12:00:00 via INTRAVENOUS
  Filled 2019-11-02: qty 5

## 2019-11-02 MED ORDER — SODIUM CHLORIDE 0.9 % IV SOLN
20.0000 mg/m2 | Freq: Once | INTRAVENOUS | Status: AC
  Administered 2019-11-02: 31 mg via INTRAVENOUS
  Filled 2019-11-02: qty 31

## 2019-11-02 MED ORDER — POTASSIUM CHLORIDE 2 MEQ/ML IV SOLN
Freq: Once | INTRAVENOUS | Status: AC
  Administered 2019-11-02: 10:00:00 via INTRAVENOUS
  Filled 2019-11-02: qty 1000

## 2019-11-02 MED ORDER — PALONOSETRON HCL INJECTION 0.25 MG/5ML
INTRAVENOUS | Status: AC
  Filled 2019-11-02: qty 5

## 2019-11-02 MED ORDER — FUROSEMIDE 10 MG/ML IJ SOLN
INTRAMUSCULAR | Status: AC
  Filled 2019-11-02: qty 2

## 2019-11-02 MED ORDER — SODIUM CHLORIDE 0.9% FLUSH
10.0000 mL | Freq: Once | INTRAVENOUS | Status: AC | PRN
  Administered 2019-11-02: 10 mL
  Filled 2019-11-02: qty 10

## 2019-11-02 MED ORDER — PALONOSETRON HCL INJECTION 0.25 MG/5ML
0.2500 mg | Freq: Once | INTRAVENOUS | Status: AC
  Administered 2019-11-02: 0.25 mg via INTRAVENOUS

## 2019-11-02 MED ORDER — SODIUM CHLORIDE 0.9 % IV SOLN: 1200.0000 mg | Freq: Once | INTRAVENOUS | Status: DC

## 2019-11-02 MED ORDER — SODIUM CHLORIDE 0.9 % IV SOLN
Freq: Once | INTRAVENOUS | Status: AC
  Administered 2019-11-02: 12:00:00 via INTRAVENOUS
  Filled 2019-11-02: qty 250

## 2019-11-02 MED ORDER — SODIUM CHLORIDE 0.9% FLUSH
10.0000 mL | INTRAVENOUS | Status: DC | PRN
  Administered 2019-11-02: 16:00:00 10 mL
  Filled 2019-11-02: qty 10

## 2019-11-02 MED ORDER — HEPARIN SOD (PORK) LOCK FLUSH 100 UNIT/ML IV SOLN
500.0000 [IU] | Freq: Once | INTRAVENOUS | Status: AC | PRN
  Administered 2019-11-02: 16:00:00 500 [IU]
  Filled 2019-11-02: qty 5

## 2019-11-02 MED ORDER — FUROSEMIDE 10 MG/ML IJ SOLN
20.0000 mg | Freq: Once | INTRAMUSCULAR | Status: AC
  Administered 2019-11-02: 20 mg via INTRAVENOUS

## 2019-11-02 MED ORDER — SODIUM CHLORIDE 0.9 % IV SOLN
800.0000 mg/m2 | Freq: Once | INTRAVENOUS | Status: AC
  Administered 2019-11-02: 13:00:00 1216 mg via INTRAVENOUS
  Filled 2019-11-02: qty 31.98

## 2019-11-02 NOTE — Patient Instructions (Signed)
[facility] Discharge Instructions for Patients Receiving Chemotherapy  Today you received the following chemotherapy agents:  Cisplatin and Gemcitibine.  To help prevent nausea and vomiting after your treatment, we encourage you to take your nausea medication as prescribed. NO ZOFRAN FOR 3 DAYS AFTER CHEMO.   If you develop nausea and vomiting that is not controlled by your nausea medication, call the clinic.   BELOW ARE SYMPTOMS THAT SHOULD BE REPORTED IMMEDIATELY:  *FEVER GREATER THAN 100.5 F  *CHILLS WITH OR WITHOUT FEVER  NAUSEA AND VOMITING THAT IS NOT CONTROLLED WITH YOUR NAUSEA MEDICATION  *UNUSUAL SHORTNESS OF BREATH  *UNUSUAL BRUISING OR BLEEDING  TENDERNESS IN MOUTH AND THROAT WITH OR WITHOUT PRESENCE OF ULCERS  *URINARY PROBLEMS  *BOWEL PROBLEMS  UNUSUAL RASH Items with * indicate a potential emergency and should be followed up as soon as possible.  Feel free to call the clinic should you have any questions or concerns. The clinic phone number is [phone].  Please show the [facility] at check-in to the Emergency Department and triage nurse.

## 2019-11-02 NOTE — Patient Instructions (Signed)

## 2019-11-03 LAB — CANCER ANTIGEN 19-9: CA 19-9: 4767 U/mL — ABNORMAL HIGH (ref 0–35)

## 2019-11-05 ENCOUNTER — Telehealth: Payer: Self-pay | Admitting: Hematology

## 2019-11-05 NOTE — Telephone Encounter (Signed)
Scheduled appt per 11/13 los.  Spoke with pt friend and she is going to inform the pt of the appt date and time.  Gave her my number to give to the pt in case the pt wants to reschedule the appt time or date,

## 2019-11-06 ENCOUNTER — Other Ambulatory Visit: Payer: Self-pay | Admitting: Internal Medicine

## 2019-11-06 ENCOUNTER — Ambulatory Visit: Payer: Medicare Other | Admitting: Cardiovascular Disease

## 2019-11-06 ENCOUNTER — Other Ambulatory Visit: Payer: Self-pay

## 2019-11-06 ENCOUNTER — Encounter: Payer: Self-pay | Admitting: Cardiovascular Disease

## 2019-11-06 VITALS — BP 100/67 | HR 94 | Temp 96.9°F | Ht 59.0 in | Wt 122.6 lb

## 2019-11-06 DIAGNOSIS — Z951 Presence of aortocoronary bypass graft: Secondary | ICD-10-CM | POA: Diagnosis not present

## 2019-11-06 DIAGNOSIS — Z79899 Other long term (current) drug therapy: Secondary | ICD-10-CM | POA: Diagnosis not present

## 2019-11-06 DIAGNOSIS — I2510 Atherosclerotic heart disease of native coronary artery without angina pectoris: Secondary | ICD-10-CM

## 2019-11-06 DIAGNOSIS — I10 Essential (primary) hypertension: Secondary | ICD-10-CM

## 2019-11-06 DIAGNOSIS — E785 Hyperlipidemia, unspecified: Secondary | ICD-10-CM

## 2019-11-06 DIAGNOSIS — C799 Secondary malignant neoplasm of unspecified site: Secondary | ICD-10-CM

## 2019-11-06 DIAGNOSIS — J4520 Mild intermittent asthma, uncomplicated: Secondary | ICD-10-CM

## 2019-11-06 DIAGNOSIS — I2583 Coronary atherosclerosis due to lipid rich plaque: Secondary | ICD-10-CM

## 2019-11-06 LAB — TSH: TSH: 1.36 u[IU]/mL (ref 0.450–4.500)

## 2019-11-06 LAB — LIPID PANEL
Chol/HDL Ratio: 3.9 ratio (ref 0.0–4.4)
Cholesterol, Total: 201 mg/dL — ABNORMAL HIGH (ref 100–199)
HDL: 52 mg/dL (ref >39)
LDL Chol Calc (NIH): 123 mg/dL — ABNORMAL HIGH (ref 0–99)
Triglycerides: 147 mg/dL (ref 0–149)
VLDL Cholesterol Cal: 26 mg/dL (ref 5–40)

## 2019-11-06 MED ORDER — BUDESONIDE-FORMOTEROL FUMARATE 160-4.5 MCG/ACT IN AERO: INHALATION_SPRAY | RESPIRATORY_TRACT | 3 refills | Status: DC

## 2019-11-06 MED ORDER — FUROSEMIDE 20 MG PO TABS: 20.0000 mg | ORAL_TABLET | Freq: Every day | ORAL | 3 refills | Status: DC

## 2019-11-06 NOTE — Patient Instructions (Signed)
Medication Instructions:  DECREASE LASIX 20MG  DAILY If you need a refill on your cardiac medications before your next appointment, please call your pharmacy.  Labwork: FASTING LIPID AND TSH TOEDAY HERE IN OUR OFFICE AT [facility]    If you have labs (blood work) drawn today and your tests are completely normal, you will receive your results only by: Marland Kitchen MyChart Message (if you have MyChart) OR . A paper copy in the mail If you have any lab test that is abnormal or we need to change your treatment, we will call you to review the results.  Follow-Up: IN 6 months Please call our office 2 months in advance, [facility] 2021 to schedule this MAY 2021 appointment. In Person Shelva Majestic, MD.    At [facility], you and your health needs are our priority.  As part of our continuing mission to provide you with exceptional heart care, we have created designated Provider Care Teams.  These Care Teams include your primary Cardiologist (physician) and Advanced Practice Providers (APPs -  Physician Assistants and Nurse Practitioners) who all work together to provide you with the care you need, when you need it.  Thank you for choosing [facility] at [facility]!!

## 2019-11-06 NOTE — Progress Notes (Signed)
Patient ID: Cassie Campbell, female   DOB: 1941/04/14, 78 y.o.   MRN: [id number]     HPI: Cassie Campbell is a 78 y.o. female who presents for a 7 month follow-up cardiology evaluation.  Ms. Tenorio has a history of complex CAD and underwent catheterization by Dr. Fletcher Anon with class IV symptoms on 10/16/2014.  She was found to have diffusely diseased LAD, which was not suitable for revascularization.  She had ruled in for non-ST segment elevation myocardial infarction, felt to be due to a severely calcified almost subtotal RCA with severe diffuse calcified disease.  Initial attempt at PCI by Dr. Fletcher Anon was unsuccessful due to the severe calcification.  She was brought back to the laboratory the following day, and I performed a very long attempt at high-speed rotational atherectomy.  Unfortunately, due to the severely stenosed calcified lesion above the acute margin the Roto floppy wire was never be advanced distal enough due to severe disease beyond this site to allow the burr to be inserted to reach the stenosis.  Multiple attempts were made to utilize wire transfer, but even the smallest catheter was never able to pass the stenosis to allow for the Roto floppy wire to be reinserted in exchange for a Fielder XT wire, which was able to cross the stenosis and advanced distally.  Consequently, the procedure was aborted.  She was sent home on increased medication regimen with potential plans for possible follow-up evaluation depending upon symptom status.  Since hospital discharge, she has not experienced any significant recurrent anginal chest pain.  During the hospitalization ranolazine was added initially at 500 mg twice a day to isosorbide mononitrate, amlodipine, and beta blocker therapy.  She has been treated with aspirin and Brilinta for dual antiplatelet therapy. When I saw her for subsequent evaluation I further titrated her ranolazine to 1000 mg twice a day and further titrated her Lopressor to 37.5 mg  twice a day.  Her creatinine remained normal following her contrast load and she was mildly anemic.    She has a history of asthma and is on Symbicort 160-4.5 and Singulair.  She  has a history of hyperlipidemia, currently on Lipitor 80 mg.  She does have GERD for which he takes Pepcid as well as omeprazole.  She developed some increased wheezing on Lopressor and was changed to bisoprolol 5 mg in place of metoprolol.  She has seen Dr. Melvyn Novas for her severe chronic asthma and in the past.  She also was found to have marked atopy with significant elevation of IgG E levels.  She feels that her breathing has improved with the changed to bisoprolol from Lopressor.   In March 2016 she had been remaining stable but  had experienced a 10-15 minute episode of chest pain during the evening which responded to nitroglycerin. When I saw her the following day she had been taking isosorbide 90 mg in the morning and I added isosorbide 30 mg at bedtime to her medical regimen of Ranexa 1000 mg twice a day , amlodipine 10 mg daily, and bisoprolol 5 mg. She denies recurrent chest pain symptomatology since the nocturnal dose of Imdur was added.  She developed a rash and  was advised to stop both Brilinta and Ranexa.  She was started on Plavix. Her rash ultimately resolved.    Her husband passed away in on 01/13/2016.  She has been more active.  She denies nocturnal symptoms.  She was evaluated in the emergency room in January 2017 with right  shoulder discomfort.    She underwent low back surgery in August 2018 by Dr. Louanne Skye. She tolerated surgery well.  A preoperative nuclear study showed an EF of 70% with probable apical soft tissue attenuation.  There was no ischemia.  Last saw her in November 2018 and she was doing well without chest pain or shortness of breath.  Although she has a history of mild asthma.   She was diagnosed with GERD.  She underwent successful cataract surgery right eye in February and left eye in April  2019 by Dr. Satira Sark.  Since I saw her in June 2019 she was seen by Kerin Ransom on September 26, 2018 with a history suggestive of recurrent unstable angina.  She was hospitalized and on September 27, 2018 repeat cardiac catheterization by me was performed.  This revealed severe coronary calcification involving the LAD circumflex and RCA.  There was smooth 20 to 25% distal left main narrowing.  The LAD had 40% proximal calcified stenosis.  The LAD was severely calcified with 95% stenosis in the region of the first diagonal and there was diffuse disease in the midsegment of 50% followed by 90% mid stenosis and 80% distal stenosis.  The circumflex was calcified without high-grade obstructive disease.  The RCA was calcified and was totally occluded just prior to the acute margin.  There was extensive left-to-right collateralization to the distal RCA via the left circulation.  There was low normal LV function with an EF of 50 to 55% with a small region of focal mid mild anterolateral and posterior basal inferior hypocontractility.  Due to the severe calcification CABG revascularization surgery was recommended.  She underwent CABG surgery x2 with her LIMA to her LAD and SVG to the PDA.  However, during surgery she was instantly found to have a mediastinal lymph node which was sent for pathology and unfortunately showed metastatic adenocarcinoma.  She is now being followed by Dr. 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  PET and CT imaging of her chest abdomen and pelvis with contrast showed diffuse adenopathy from supraclavicular to the abdomen with a hypermetabolic mass in the gallbladder and large hypermetabolic bony lesion in the posterior left pelvis.    I saw her on December 06, 2018 in follow-up of her CABG revascularization and oncologic assessments.  She underwent successful Port-A-Cath placement on December 20 and Plavix was held for this procedure.  I recommended that  she undergo an echo Doppler study prior to initiating chemotherapy.  Her echo Doppler study was done on December 11, 2018 and showed an EF of 40 to 45% status post revascularization with residual akinesis of the mid apical anteroseptal, inferoseptal and apical wall.  She had grade 1 diastolic dysfunction.  Tissue Doppler was consistent with high ventricular filling pressures.  She had mildly increased PA pressure 38 mm and abnormal global longitudinal strain pattern.  On December 15, 2018 she initiated her first course of chemotherapy.  She had some mild nausea but otherwise felt well.  She is feeling better.  She is scheduled to have a follow-up oncology evaluation later this week.  From a cardiac perspective, she denies any chest pain or shortness of breath.  She is now able to walk from room to room without her previous dyspnea or chest discomfort.  She feels improved.    I last saw her in a telemedicine evaluation in April 2020.  Since her December evaluation she had been undergoing successful chemotherapy every 2  weeks with cis-platinum and gemcitabine.  She has completed 6 cycles and is currently taking a month off with plans for reinstitution on May 1.  Her most recent PET scan was significantly encouraging and showed significant early benefit with reduction in tumor burden.   Since I last saw her, she did to undergo chemotherapy and had her last treatment last Friday.  She denies any recurrent chest pain.  She does admit to some fatigue following treatment.  There has been a reduction in tumor burden.  She has lost at least 12 pounds.  She denies recurrent chest pain or anginal symptomatology.  She denies PND orthopnea.  She presents for evaluation.  Past Medical History:  Diagnosis Date   Allergic rhinitis    Arthritis    Asthma    xolair s 8/05 ?11/07; mastered hfa 12/20/08   Benign positional vertigo    CAD (coronary artery disease)    a. 09/2014 NSTEMI s/p LHC with sig 2V dz. dLAD  diffusely diseased and not suitable for PCI. unsuccessful RCA PCI d/t heavy calcifications   gallbladder ca dx'd 10/2018   GERD (gastroesophageal reflux disease)    Heart attack ([place])    09/2014   Hyperlipidemia    <130 ldl pos fm hx, bp   Hypertension    Osteopenia    dexa 08/22/07 AP spine + 1.1, left femur -1.3, right femur -.8; dexa 10/06/09 +1.6, left femur =1.6, right femur -.   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 breast bx--for microcalcifications in rt. retroareolar region--dx was hyalinized fibroadenoma   BUNIONECTOMY Bilateral    CARDIAC CATHETERIZATION     2015   CATARACT EXTRACTION Right 02/02/2018   Dr Satira Sark   CATARACT EXTRACTION Left 03/30/2018   CERVICAL [place] SURGERY  12/01   CORONARY ARTERY BYPASS GRAFT N/A 10/04/2018   Procedure: CORONARY ARTERY BYPASS GRAFTING (CABG) times 2 using left  Internal mammary artery to LAD, left greater saphenous vein - open harvest.;  Surgeon: [name], MD;  Location: [facility];  Service: Open Heart Surgery;  Laterality: N/A;   IR IMAGING GUIDED PORT INSERTION  12/08/2018   LEFT HEART CATH AND CORONARY ANGIOGRAPHY N/A 09/27/2018   Procedure: LEFT HEART CATH AND CORONARY ANGIOGRAPHY;  Surgeon: Troy Sine, MD;  Location: [place] [facility];  Service: Cardiovascular;  Laterality: N/A;   LEFT HEART CATHETERIZATION WITH CORONARY ANGIOGRAM N/A 10/16/2014   Procedure: LEFT HEART CATHETERIZATION WITH CORONARY ANGIOGRAM;  Surgeon: Blane Ohara, MD;  Location: [facility] CATH LAB;  Service: Cardiovascular;  Laterality: N/A;   Left L4-5 transforaminal lumbar interbody fusion with Depuy cage, rods and screws, local and allograft bone graft, Vivigen; bilateral decompression/partial hemilaminectomy lumbar five-sacral one  07/2017    PERCUTANEOUS CORONARY ROTOBLATOR INTERVENTION (PCI-R) N/A 10/17/2014   Procedure: PERCUTANEOUS CORONARY ROTOBLATOR INTERVENTION (PCI-R);  Surgeon: Troy Sine, MD;  Location: [facility] CATH LAB;  Service: Cardiovascular;  Laterality: N/A;   TEE WITHOUT CARDIOVERSION N/A 10/04/2018   Procedure: TRANSESOPHAGEAL ECHOCARDIOGRAM (TEE);  Surgeon: [name], MD;  Location: [place];  Service: Open Heart Surgery;  Laterality: N/A;   VEIN LIGATION AND STRIPPING Left    VIDEO BRONCHOSCOPY Bilateral 02/05/2015   Procedure: VIDEO BRONCHOSCOPY WITHOUT FLUORO;  Surgeon: Tanda Rockers, MD;  Location: [facility] ENDOS[facility];  Service:  Endoscopy;  Laterality: Bilateral;   VULVA /PERINEUM BIOPSY  12-30-10   --epidermoid cyst    Allergies  Allergen Reactions   Fish-Derived Products Shortness Of Breath, Swelling and Rash   Penicillins Shortness Of Breath, Swelling and Rash    PATIENT HAS HAD A PCN REACTION WITH IMMEDIATE RASH, FACIAL/TONGUE/THROAT SWELLING, SOB, OR LIGHTHEADEDNESS WITH HYPOTENSION:  #  #  #  YES  #  #  #   Has patient had a PCN reaction causing severe rash involving mucus membranes or skin necrosis: No PATIENT HAS HAD A PCN REACTION THAT REQUIRED HOSPITALIZATION:  #  #  #  YES  #  #  #   Has patient had a PCN reaction occurring within the last 10 years: No     Aspirin Swelling and Rash    SWELLING REACTION UNSPECIFIED    Brilinta [Ticagrelor] Rash    Started Brilinta 09/2014 - rash - unsure which it is directly related to   Codeine Phosphate Nausea Only   Diphenhydramine Hcl Rash    Current Outpatient Medications  Medication Sig Dispense Refill   albuterol (PROAIR HFA) 108 (90 Base) MCG/ACT inhaler INHALE 1 TO 2 PUFFS BY MOUTH EVERY 4 HOURS AS NEEDED FOR WHEEZING 8.5 g 3   atorvastatin (LIPITOR) 80 MG tablet TAKE 1 TABLET(80 MG) BY MOUTH DAILY 90 tablet 1   bisoprolol (ZEBETA) 10 MG tablet TAKE 1 TABLET(10 MG) BY MOUTH DAILY 90 tablet 1   cetirizine (ZYRTEC) 10 MG tablet Take  10 mg by mouth at bedtime as needed for allergies.     cholecalciferol 2000 units TABS Take 1 tablet (2,000 Units total) by mouth daily. 30 tablet 3   clopidogrel (PLAVIX) 75 MG tablet Take 1 tablet (75 mg total) by mouth daily. 90 tablet 3   dexamethasone (DECADRON) 4 MG tablet Take 1 tablet (4 mg total) by mouth 2 (two) times daily with a meal. 20 tablet 0   dextromethorphan-guaiFENesin (MUCINEX DM) 30-600 MG per 12 hr tablet Take 1-2 tablets by mouth every 12 (twelve) hours as needed for cough (with flutter).      famotidine (PEPCID) 20 MG tablet Take 20 mg by mouth as needed.      ferrous sulfate 325 (65 FE) MG tablet Take 650 mg by mouth daily with breakfast.      fluticasone (FLONASE) 50 MCG/ACT nasal spray Place 1-2 sprays into both nostrils 2 (two) times daily as needed for allergies or rhinitis.     lidocaine-prilocaine (EMLA) cream Apply to affected area once 30 g 3   magic mouthwash SOLN Take 5 mLs by mouth 4 (four) times daily. Leave Benadryl out of compound since pt allergic to it. Use hydrocortisone & nystatin. Swish & swallow or spit for thrush 240 mL 1   magnesium oxide (MAGNESIUM-OXIDE) 400 (241.3 Mg) MG tablet Take 1 tablet (400 mg total) by mouth 3 (three) times daily. 90 tablet 1   meclizine (ANTIVERT) 25 MG tablet TAKE 2 TABLETS BY MOUTH THREE TIMES DAILY AS NEEDED 24 tablet 2   montelukast (SINGULAIR) 10 MG tablet TAKE 1 TABLET(10 MG) BY MOUTH AT BEDTIME 90 tablet 1   Multiple Vitamin (MULTIVITAMIN) capsule Take 1 capsule by mouth daily.      omeprazole (PRILOSEC) 20 MG capsule Take 20 mg by mouth as needed.      ondansetron (ZOFRAN) 8 MG tablet Take 1 tablet (8 mg total) by mouth 2 (two) times daily as needed. Start on the third day after chemotherapy. 30 tablet  1   prochlorperazine (COMPAZINE) 10 MG tablet Take 1 tablet (10 mg total) by mouth every 6 (six) hours as needed (Nausea or vomiting). 30 tablet 1   budesonide-formoterol (SYMBICORT) 160-4.5 MCG/ACT  inhaler INHALE 2 PUFFS BY MOUTH EVERY 12 HOURS 30.6 g 3   furosemide (LASIX) 20 MG tablet Take 1 tablet (20 mg total) by mouth daily. 180 tablet 3   No current facility-administered medications for this visit.     Social History   Socioeconomic History   Marital status: Widowed    Spouse name: Not on file   Number of children: Not on file   Years of education: Not on file   Highest education level: Not on file  Occupational History   Occupation: retired Company secretary strain: Not on file   Food insecurity    Worry: Not on file    Inability: Not on file   Transportation needs    Medical: Not on file    Non-medical: Not on file  Tobacco Use   Smoking status: Never Smoker   Smokeless tobacco: Never Used  Substance and Sexual Activity   Alcohol use: No    Alcohol/week: 0.0 standard drinks   Drug use: No   Sexual activity: Not Currently    Partners: Male    Birth control/protection: Post-menopausal  Lifestyle   Physical activity    Days per week: Not on file    Minutes per session: Not on file   Stress: Not on file  Relationships   Social connections    Talks on phone: Not on file    Gets together: Not on file    Attends religious service: Not on file    Active member of club or organization: Not on file    Attends meetings of clubs or organizations: Not on file    Relationship status: Not on file   Intimate partner violence    Fear of current or ex partner: Not on file    Emotionally abused: Not on file    Physically abused: Not on file    Forced sexual activity: Not on file  Other Topics Concern   Not on file  Social History Narrative   Not on file    Family History  Problem Relation Age of Onset   Diabetes Mother        AODM   Stroke Mother    Hypertension Mother    Heart disease Father    Diabetes Sister        AODM   Hypertension Sister    Breast cancer Daughter 36        dec--mets to liver/spine    ROS General: Negative; No fevers, chills, or night sweats HEENT: This post recent cataract surgery in both eyes in February and April 2019; no sinus congestion, difficulty swallowing Pulmonary: positive for asthma; No cough, wheezing, shortness of breath, hemoptysis Cardiovascular: See HPI:  GI: Negative; No nausea, vomiting, diarrhea, or abdominal pain GU: Negative; No dysuria, hematuria, or difficulty voiding Musculoskeletal: Occasional right shoulder discomfort, and low back discomfort Hematologic/Oncologic:  metastatic adenocarcinoma, 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 Other comprehensive 14 point system review is negative   Physical Exam BP 100/67    Pulse 94    Temp (!)  96.9 F (36.1 C)    Ht 4' 11"  (1.499 m)    Wt 122 lb 9.6 oz (55.6 kg)    LMP 12/21/1999    SpO2 93%    BMI 24.76 kg/m    Repeat blood pressure by me 112/70  Wt Readings from Last 3 Encounters:  11/06/19 122 lb 9.6 oz (55.6 kg)  11/02/19 123 lb 3.2 oz (55.9 kg)  10/05/19 126 lb 9.6 oz (57.4 kg)   General: Alert, oriented, no distress.  Skin: normal turgor, no rashes, warm and dry HEENT: Normocephalic, atraumatic. Pupils equal round and reactive to light; sclera anicteric; extraocular muscles intact; Nose without nasal septal hypertrophy Mouth/Parynx benign; Mallinpatti scale 2 Neck: No JVD, no carotid bruits; normal carotid upstroke Lungs: clear to ausculatation and percussion; no wheezing or rales Chest wall: without tenderness to palpitation Heart: PMI not displaced, RRR, s1 s2 normal, 1/6 systolic murmur, no diastolic murmur, no rubs, gallops, thrills, or  heaves Abdomen: soft, nontender; no hepatosplenomehaly, BS+; abdominal aorta nontender and not dilated by palpation. Back: no CVA tenderness Pulses 2+ Musculoskeletal: full range of motion, normal strength, no joint deformities Extremities: no clubbing cyanosis or edema, Homan's sign negative  Neurologic: grossly nonfocal; Cranial nerves grossly wnl Psychologic: Normal mood and affect   ECG (independently read by me): Sinus rhythm at 89 bpm.  Nonspecific T wave abnormality.  Normal intervals.  No ectopy  January 2020 ECG (independently read by me): Normal sinus rhythm at 78 bpm.  Biatrial enlargement.  QS complex V1 through V3.  Anterolateral T wave abnormality.  December 2019 ECG (independently read by me): Sinus rhythm at 76 bpm.  QS complex anteroseptally.  Lateral T wave abnormality.    June 2019 ECG (independently read by me): Normal sinus rhythm 81 bpm.  Right bundle branch block with repolarization changes.  November 16, 2017 ECG (independently read by me): Normal sinus rhythm at 75 bpm with right bundle branch block.  Left axis deviation.  Q waves in 3 and aVF.  April 2018 ECG (independently read by me): Normal sinus rhythm at 74 bpm.  Right bundle-branch block with repolarization changes.  October 2017 ECG (independently read by me): Normal sinus rhythm at 68 bpm.  The right bundle branch block with repolarization changes.  April 2017 ECG (independently read by me): Normal sinus rhythm at 70 bpm.  Right bundle-branch block with repolarization changes.  November 2016 ECG (independently read by me): Normal sinus rhythm at 70 bpm.  Right bundle-branch block with repolarization changes.  May 2016 ECG (independently read by me):  Normal sinus rhythm at 65 bpm. Right bundle branch block with repolarization changes.  March 2016 ECG (independently read by me): Normal sinus rhythm at 68 bpm.  No ectopy.  No signal been ST segment changes.  December 2015 ECG (independently read by me):  Normal sinus rhythm at 77 bpm.  Normal intervals.  No significant ST changes.  November 2015 ECG (independently read by me):sinus rhythm at 88 bpm.  QTc interval 454 ms.  No significant ST-T changes.  LABS:  BMP Latest Ref Rng & Units 11/02/2019 10/05/2019 09/21/2019  Glucose 70 - 99 mg/dL 136(H) 97 92  BUN 8 - 23 mg/dL 16 15 15   Creatinine 0.44 - 1.00 mg/dL 1.05(H) 1.05(H) 1.01(H)  BUN/Creat Ratio 12 - 28 - - -  Sodium 135 - 145 mmol/L 139 141 138  Potassium 3.5 - 5.1 mmol/L 3.7 5.2(H) 4.6  Chloride 98 - 111 mmol/L 99 104 104  CO2 22 -  32 mmol/L 24 24 25   Calcium 8.9 - 10.3 mg/dL 9.6 9.6 9.3    Hepatic Function Latest Ref Rng & Units 11/02/2019 10/05/2019 09/21/2019  Total Protein 6.5 - 8.1 g/dL 9.0(H) 8.1 8.1  Albumin 3.5 - 5.0 g/dL 3.5 3.6 3.7  AST 15 - 41 U/L 26 30 28   ALT 0 - 44 U/L 11 15 15   Alk Phosphatase 38 - 126 U/L 95 84 88  Total Bilirubin 0.3 - 1.2 mg/dL 0.4 0.2(L) 0.3  Bilirubin, Direct 0.0 - 0.3 mg/dL - - -    CBC Latest Ref Rng & Units 11/02/2019 10/05/2019 09/21/2019  WBC 4.0 - 10.5 K/uL 9.1 6.0 7.1  Hemoglobin 12.0 - 15.0 g/dL 10.9(L) 9.7(L) 9.6(L)  Hematocrit 36.0 - 46.0 % 34.0(L) 30.4(L) 30.0(L)  Platelets 150 - 400 K/uL 426(H) 216 213   Lab Results  Component Value Date   MCV 104.3 (H) 11/02/2019   MCV 105.6 (H) 10/05/2019   MCV 106.0 (H) 09/21/2019   Lab Results  Component Value Date   TSH 1.360 11/06/2019   Lab Results  Component Value Date   HGBA1C 5.9 (H) 10/04/2018    Lipid Panel     Component Value Date/Time   CHOL 201 (H) 11/06/2019 1010   TRIG 147 11/06/2019 1010   TRIG 85 10/05/2006 1022   HDL 52 11/06/2019 1010   CHOLHDL 3.9 11/06/2019 1010   CHOLHDL 3 03/09/2018 0909   VLDL 21.8 03/09/2018 0909   LDLCALC 123 (H) 11/06/2019 1010   RADIOLOGY: No results found.  IMPRESSION:  1. Essential hypertension   2. Medication management   3. Coronary artery disease due to lipid rich plaque   4. S/P CABG (coronary artery bypass graft)    5. Metastatic adenocarcinoma ([place])   6. Hyperlipidemia with target LDL less than 70   7. Mild intermittent asthma without complication     ASSESSMENT AND PLAN: Mrs. Kareli Hossain is a 78 year-old African-American female who suffered a NSTEMI in October 2015 which was felt to be due to subtotal high-grade calcified RCA with severe diffuse distal disease beyond her subtotal stenosis.  She also has a severely diffusely diseased mid distal LAD which is not amenable for revascularization.  At the time of her intervention, the smallest balloon catheter that was available in the catheterization laboratory was a 1.20 mm and this was unable to cross the stenosis.  She has been on medical therapy but in October 2019 she developed recurrent angina consistent with unstable symptoms necessitating repeat catheterization.  She had severely calcified CAD  and unsuccessful CABG revascularization surgery.  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  Her echo Doppler study prior to initiating chemotherapy showed an EF of 40 to 45% with akinesis of the mid apical anteroseptal, inferoseptal, and apical myocardium with anterior hypokinesis.  She had grade 1 diastolic dysfunction and abnormal tissue Doppler consistent with high ventricular filling pressures.  Systolic peak pressure was elevated at 38 mm.  Global strain was abnormal.  She initiated chemotherapy on December 15, 2018  and tolerated her initial treatment fairly well.  Is followed by Dr. Annamaria Boots and had her last treatment last week.  Subsequent imaging studies have shown a reduction of her tumor burden.  She has lost 12 pounds.  There is no edema.  Her blood pressure today is stable and low.  I have recommended she  reduce her furosemide dose from 40 mg daily down to 20 mg.  She continues to be on clopidogrel  for antiplatelet therapy.  She is on a atorvastatin 80 mg for hyperlipidemia with most recent LDL cholesterol at 78 in December 2019.  Her ECG is stable on bisoprolol 10 mg with sinus rhythm.  She will follow-up with Dr. Annamaria Boots.  She has had laboratory only but has not had lipid studies in a year.  I will check fasting lipid studies and TSH level on current therapy.  She has history of asthma and is on Symbicort and Singulair with plan follow-up visit with Dr. Melvyn Novas.  I will see her in 6 months for reevaluation   Time spent: 25 minutes Troy Sine, MD, [facility]  11/08/2019 6:29 PM

## 2019-11-08 ENCOUNTER — Encounter: Payer: Self-pay | Admitting: Cardiovascular Disease

## 2019-11-13 ENCOUNTER — Ambulatory Visit: Payer: Medicare Other | Admitting: Internal Medicine

## 2019-11-13 ENCOUNTER — Other Ambulatory Visit: Payer: Self-pay | Admitting: Hematology

## 2019-11-14 ENCOUNTER — Ambulatory Visit (HOSPITAL_COMMUNITY)
Admission: RE | Admit: 2019-11-14 | Discharge: 2019-11-14 | Disposition: A | Discharge status: Home / Self Care | Payer: Medicare Other | Source: Ambulatory Visit | Attending: Specialist | Admitting: Specialist

## 2019-11-14 ENCOUNTER — Inpatient Hospital Stay (HOSPITAL_BASED_OUTPATIENT_CLINIC_OR_DEPARTMENT_OTHER): Payer: Medicare Other | Admitting: Nurse Practitioner

## 2019-11-14 ENCOUNTER — Ambulatory Visit: Payer: Self-pay

## 2019-11-14 ENCOUNTER — Inpatient Hospital Stay: Payer: Medicare Other

## 2019-11-14 ENCOUNTER — Ambulatory Visit: Payer: Medicare Other | Admitting: Specialist

## 2019-11-14 ENCOUNTER — Encounter: Payer: Self-pay | Admitting: Specialist

## 2019-11-14 ENCOUNTER — Other Ambulatory Visit: Payer: Self-pay

## 2019-11-14 ENCOUNTER — Encounter: Payer: Self-pay | Admitting: Nurse Practitioner

## 2019-11-14 VITALS — BP 147/81 | HR 70 | Ht 59.0 in | Wt 123.0 lb

## 2019-11-14 VITALS — BP 138/77 | HR 88 | Temp 98.5°F | Resp 18 | Wt 126.0 lb

## 2019-11-14 DIAGNOSIS — C23 Malignant neoplasm of gallbladder: Secondary | ICD-10-CM

## 2019-11-14 DIAGNOSIS — M160 Bilateral primary osteoarthritis of hip: Secondary | ICD-10-CM

## 2019-11-14 DIAGNOSIS — Z5111 Encounter for antineoplastic chemotherapy: Secondary | ICD-10-CM | POA: Diagnosis not present

## 2019-11-14 DIAGNOSIS — M25559 Pain in unspecified hip: Secondary | ICD-10-CM

## 2019-11-14 LAB — CMP (CANCER CENTER ONLY)
ALT: 14 U/L (ref 0–44)
AST: 32 U/L (ref 15–41)
Albumin: 3.3 g/dL — ABNORMAL LOW (ref 3.5–5.0)
Alkaline Phosphatase: 86 U/L (ref 38–126)
Anion gap: 11 (ref 5–15)
BUN: 16 mg/dL (ref 8–23)
CO2: 28 mmol/L (ref 22–32)
Calcium: 9.4 mg/dL (ref 8.9–10.3)
Chloride: 101 mmol/L (ref 98–111)
Creatinine: 0.97 mg/dL (ref 0.44–1.00)
GFR, Est AFR Am: >60 mL/min (ref >60)
GFR, Estimated: 56 mL/min — ABNORMAL LOW (ref >60)
Glucose, Bld: 100 mg/dL — ABNORMAL HIGH (ref 70–99)
Potassium: 3.7 mmol/L (ref 3.5–5.1)
Sodium: 140 mmol/L (ref 135–145)
Total Bilirubin: 0.2 mg/dL — ABNORMAL LOW (ref 0.3–1.2)
Total Protein: 8.1 g/dL (ref 6.5–8.1)

## 2019-11-14 LAB — CBC WITH DIFFERENTIAL (CANCER CENTER ONLY)
Abs Immature Granulocytes: 0.02 10*3/uL (ref 0.00–0.07)
Basophils Absolute: 0 10*3/uL (ref 0.0–0.1)
Basophils Relative: 1 %
Eosinophils Absolute: 0.7 10*3/uL — ABNORMAL HIGH (ref 0.0–0.5)
Eosinophils Relative: 12 %
HCT: 30.6 % — ABNORMAL LOW (ref 36.0–46.0)
Hemoglobin: 9.7 g/dL — ABNORMAL LOW (ref 12.0–15.0)
Immature Granulocytes: 0 %
Lymphocytes Relative: 17 %
Lymphs Abs: 1.1 10*3/uL (ref 0.7–4.0)
MCH: 32.7 pg (ref 26.0–34.0)
MCHC: 31.7 g/dL (ref 30.0–36.0)
MCV: 103 fL — ABNORMAL HIGH (ref 80.0–100.0)
Monocytes Absolute: 0.4 10*3/uL (ref 0.1–1.0)
Monocytes Relative: 6 %
Neutro Abs: 4 10*3/uL (ref 1.7–7.7)
Neutrophils Relative %: 64 %
Platelet Count: 193 10*3/uL (ref 150–400)
RBC: 2.97 MIL/uL — ABNORMAL LOW (ref 3.87–5.11)
RDW: 16.5 % — ABNORMAL HIGH (ref 11.5–15.5)
WBC Count: 6.2 10*3/uL (ref 4.0–10.5)
nRBC: 0 % (ref 0.0–0.2)

## 2019-11-14 LAB — MAGNESIUM: Magnesium: 1.5 mg/dL — ABNORMAL LOW (ref 1.7–2.4)

## 2019-11-14 MED ORDER — HEPARIN SOD (PORK) LOCK FLUSH 100 UNIT/ML IV SOLN
500.0000 [IU] | Freq: Once | INTRAVENOUS | Status: AC | PRN
  Administered 2019-11-14: 500 [IU]
  Filled 2019-11-14: qty 5

## 2019-11-14 MED ORDER — SODIUM CHLORIDE 0.9% FLUSH
10.0000 mL | Freq: Once | INTRAVENOUS | Status: AC | PRN
  Administered 2019-11-14: 10 mL
  Filled 2019-11-14: qty 10

## 2019-11-14 MED ORDER — OXYCODONE HCL 5 MG PO CAPS: 5.0000 mg | ORAL_CAPSULE | ORAL | 0 refills | Status: DC | PRN

## 2019-11-14 NOTE — Progress Notes (Signed)
Office Visit Note   Patient: Cassie Campbell           Date of Birth: 24-Apr-1941           MRN: [id number] Visit Date: 11/14/2019              Requested by: Tanda Rockers, MD [place],  [place] [id number] PCP: Tanda Rockers, MD   Assessment & Plan: Visit Diagnoses:  1. Hip pain   2. Primary osteoarthritis of both hips     Plan: Left hip is suffering from osteoarthritis, but there is also a suspicious area of the left pelvis that is making bone, only real proven treatments are Weight loss, pain meds for discomfort and use a cane in the hand opposite the painful hip. Well padded shoes help. Ice or hot shower 2-3 times a day 15-20 mins at a time. OxycodoneIR for pain. CT scan of the left hip ordered to assess bone changes left ischium.   Follow-Up Instructions: Return in about 1 week (around 11/21/2019).   Orders:  Orders Placed This Encounter  Procedures  . XR HIP UNILAT W OR W/O PELVIS 2-3 VIEWS LEFT   No orders of the defined types were placed in this encounter.     Procedures: No procedures performed   Clinical Data: No additional findings.   Subjective: Chief Complaint  Patient presents with  . Left Hip - Pain    78 year old female with worsening left hip pain left posterior thigh and into the left buttock. Had stripping of vein left leg. She has had open heart surgery and bypass grafting of 2 blood Vessels by Dr. Koleen Nimrod. 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 Pain in the left posterior thigh is present at night and with first standing. She has to get up at night and walk to relieve the pain. She was up at 2 AM this morning to walk to try to relieve the pain and when driving sort of sits on the left hand to relieve the pain. No bowel or bladder difficulty. She notices weakness in the left leg. No pain with cough of sneeze.    Review of Systems  Constitutional:  Positive for activity change and unexpected weight change. Negative for appetite change, chills, diaphoresis, fatigue and fever.  HENT: Negative.  Negative for congestion, dental problem, drooling, ear discharge, ear pain, facial swelling, hearing loss, mouth sores, nosebleeds, postnasal drip, rhinorrhea, sinus pressure, sinus pain, sneezing, sore throat, tinnitus, trouble swallowing and voice change.   Eyes: Negative.   Respiratory: Positive for shortness of breath and wheezing. Negative for apnea, cough, choking, chest tightness and stridor.   Cardiovascular: Negative for chest pain, palpitations and leg swelling.  Gastrointestinal: Positive for nausea. Negative for abdominal distention, abdominal pain, anal bleeding, blood in stool, constipation, diarrhea, rectal pain and vomiting.  Endocrine: Negative for cold intolerance, heat intolerance, polydipsia, polyphagia and polyuria.  Genitourinary: Negative.   Musculoskeletal: Positive for arthralgias and gait problem. Negative for back pain, joint swelling, myalgias, neck pain and neck stiffness.  Skin: Negative.   Allergic/Immunologic: Negative for environmental allergies, food allergies and immunocompromised state.  Neurological: Positive for weakness. Negative for dizziness, tremors, seizures, syncope, facial asymmetry, speech difficulty, light-headedness, numbness and headaches.  Hematological: Negative for adenopathy. Does not bruise/bleed easily.  Psychiatric/Behavioral: Negative for agitation, behavioral problems, confusion, decreased concentration, dysphoric mood, hallucinations, self-injury, sleep disturbance and suicidal ideas.  The patient is not nervous/anxious and is not hyperactive.      Objective: Vital Signs: BP (!) 147/81 (BP Location: Left Arm, Patient Position: Sitting)   Pulse 70   Ht 4\' 11"  (1.499 m)   Wt 123 lb (55.8 kg)   LMP 12/21/1999   BMI 24.84 kg/m   Physical Exam Constitutional:      Appearance: She is  well-developed.  HENT:     Head: Normocephalic and atraumatic.  Eyes:     Pupils: Pupils are equal, round, and reactive to light.  Neck:     Musculoskeletal: Normal range of motion and neck supple.  Pulmonary:     Effort: Pulmonary effort is normal.     Breath sounds: Normal breath sounds.  Abdominal:     General: Bowel sounds are normal.     Palpations: Abdomen is soft.  Skin:    General: Skin is warm and dry.  Neurological:     Mental Status: She is alert and oriented to person, place, and time.  Psychiatric:        Behavior: Behavior normal.        Thought Content: Thought content normal.        Judgment: Judgment normal.     Right Hip Exam  Right hip exam is normal.    Left Hip Exam   Tenderness  The patient is experiencing tenderness in the posterior.  Range of Motion  Abduction: abnormal  Adduction: abnormal  Extension: abnormal  Flexion: abnormal  External rotation: abnormal  Internal rotation: abnormal   Muscle Strength  Abduction: 4/5  Adduction: 4/5  Flexion: 4/5   Tests  FABER: positive Ober: positive  Other  Erythema: absent Scars: absent   Back Exam   Tenderness  The patient is experiencing tenderness in the lumbar.  Range of Motion  Extension: normal  Flexion: normal  Lateral bend right: normal  Lateral bend left: normal  Rotation right: normal  Rotation left: normal   Muscle Strength  Right Quadriceps:  5/5  Left Quadriceps:  5/5  Right Hamstrings:  5/5  Left Hamstrings:  5/5       Specialty Comments:  No specialty comments available.  Imaging: Xr Hip Unilat W Or W/o Pelvis 2-3 Views Left  Result Date: 11/14/2019 Left hip with osteophytes forming inferomedial left femoral head. There is narrowing of the inferomedial left hip joint and large exhuberant osteophytes forming along the lateral left ischium with osteogenic sclerotic changes in the left ischium. This may represent a bone building lesion or paget's disease  localized to the left ischium. Moderate left hip OA.     PMFS History: Patient Active Problem List   Diagnosis Date Noted  . Postoperative anemia due to acute blood loss 08/16/2017    Priority: Medium    Class: Acute  . 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 Goals of care, counseling/discussion 11/21/2018  . S/P CABG (coronary artery bypass graft)   . 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 Coronary artery disease 10/04/2018  . Unstable angina ([place]) 09/26/2018  . S/P lumbar fusion 08/15/2017  . Erroneous encounter - disregard 08/08/2017  . Gout of foot 04/11/2017  . Spinal stenosis of lumbar region 01/11/2017  . DOE (dyspnea on exertion) 06/10/2016  . Muscle strain of scapular region 01/22/2016  . Tinea cruris 07/20/2015  . Right bundle branch block 04/28/2015  . Pain in the chest   . Chest pain, atypical 11/06/2014  . HCAP (healthcare-associated pneumonia) 11/05/2014  . CAD (coronary artery  disease)   . Chest pain 10/15/2014  . NSTEMI (non-ST elevated myocardial infarction) ([place]) 10/15/2014  . Health care maintenance 10/22/2013  . Arthralgia of hip 02/12/2013  . GERD (gastroesophageal reflux disease) 03/10/2012  . Intertriginous candidiasis 11/09/2011  . Atelectasis 02/24/2009  . BENIGN POSITIONAL VERTIGO 11/10/2007  . Hyperlipidemia with target LDL less than 70 10/05/2007  . Essential hypertension 10/05/2007  . Allergic rhinitis 10/05/2007  . Moderate persistent asthma in adult without complication [id number]  . Disorder of bone and cartilage 10/05/2007   Past Medical History:  Diagnosis Date  . Allergic rhinitis   . Arthritis   . Asthma    xolair s 8/05 ?11/07; mastered hfa 12/20/08  . Benign positional vertigo   . CAD (coronary artery disease)    a. 09/2014 NSTEMI s/p LHC with sig 2V dz. dLAD diffusely diseased and not suitable for PCI. unsuccessful RCA PCI d/t heavy calcifications  . gallbladder ca dx'd 10/2018  . GERD (gastroesophageal reflux disease)   . Heart attack  ([place])    09/2014  . Hyperlipidemia    <130 ldl pos fm hx, bp  . Hypertension   . Osteopenia    dexa 08/22/07 AP spine + 1.1, left femur -1.3, right femur -.8; dexa 10/06/09 +1.6, left femur =1.6, right femur -.  . PONV (postoperative nausea and vomiting)   . Ruptured disc, cervical   . 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 Diabetes Mother        AODM  . Stroke Mother   . Hypertension Mother   . Heart disease Father   . Diabetes Sister        AODM  . Hypertension Sister   . Breast cancer Daughter 62       dec--mets to liver/spine    Past Surgical History:  Procedure Laterality Date  . BREAST SURGERY  10-26-10   Rt. breast bx--for microcalcifications in rt. retroareolar region--dx was hyalinized fibroadenoma  . BUNIONECTOMY Bilateral   . CARDIAC CATHETERIZATION     2015  . CATARACT EXTRACTION Right 02/02/2018   Dr Satira Sark  . CATARACT EXTRACTION Left 03/30/2018  . [place] SURGERY  12/01  . CORONARY ARTERY BYPASS GRAFT N/A 10/04/2018   Procedure: CORONARY ARTERY BYPASS GRAFTING (CABG) times 2 using left  Internal mammary artery to LAD, left greater saphenous vein - open harvest.;  Surgeon: [name], MD;  Location: [facility];  Service: Open Heart Surgery;  Laterality: N/A;  . IR IMAGING GUIDED PORT INSERTION  12/08/2018  . LEFT HEART CATH AND CORONARY ANGIOGRAPHY N/A 09/27/2018   Procedure: LEFT HEART CATH AND CORONARY ANGIOGRAPHY;  Surgeon: Troy Sine, MD;  Location: [facility];  Service: Cardiovascular;  Laterality: N/A;  . LEFT HEART CATHETERIZATION WITH CORONARY ANGIOGRAM N/A 10/16/2014   Procedure: LEFT HEART CATHETERIZATION WITH CORONARY ANGIOGRAM;  Surgeon: Blane Ohara, MD;  Location: [facility] CATH LAB;  Service: Cardiovascular;  Laterality: N/A;  . Left L4-5 transforaminal lumbar interbody fusion with Depuy cage, rods and screws, local and allograft bone graft, Vivigen;  bilateral decompression/partial hemilaminectomy lumbar five-sacral one  07/2017  . PERCUTANEOUS CORONARY ROTOBLATOR INTERVENTION (PCI-R) N/A 10/17/2014   Procedure: PERCUTANEOUS CORONARY ROTOBLATOR INTERVENTION (PCI-R);  Surgeon: Troy Sine, MD;  Location: [facility] CATH LAB;  Service: Cardiovascular;  Laterality: N/A;  . TEE WITHOUT CARDIOVERSION N/A 10/04/2018   Procedure: TRANSESOPHAGEAL ECHOCARDIOGRAM (TEE);  Surgeon: [name], MD;  Location: [facility] OR;  Service: Open Heart Surgery;  Laterality: N/A;  . VEIN LIGATION AND STRIPPING Left   . VIDEO BRONCHOSCOPY Bilateral 02/05/2015   Procedure: VIDEO BRONCHOSCOPY WITHOUT FLUORO;  Surgeon: Tanda Rockers, MD;  Location: [facility];  Service: Endoscopy;  Laterality: Bilateral;  . VULVA /PERINEUM BIOPSY  12-30-10   --epidermoid cyst   Social History   Occupational History  . Occupation: retired Tour manager  Tobacco Use  . Smoking status: Never Smoker  . Smokeless tobacco: Never Used  Substance and Sexual Activity  . Alcohol use: No    Alcohol/week: 0.0 standard drinks  . Drug use: No  . Sexual activity: Not Currently    Partners: Male    Birth control/protection: Post-menopausal

## 2019-11-14 NOTE — Patient Instructions (Signed)

## 2019-11-14 NOTE — Progress Notes (Signed)
Lab and port flush completed by desk nurse.

## 2019-11-14 NOTE — Progress Notes (Addendum)
[place]   [phone] [phone]   Clinic Follow up Note   Patient Care Team: Tanda Rockers, MD as PCP - General (Pulmonary Disease) Troy Sine, MD as PCP - Cardiology (Cardiology) 11/14/2019  CHIEF COMPLAINT: F/u metastatic gall bladder cancer   SUMMARY OF ONCOLOGIC HISTORY: Oncology History Overview Note  Cancer Staging Gallbladder cancer [facility]) Staging form: Gallbladder, AJCC 8th Edition - Clinical stage from 11/13/2018: Stage IVB (cTX, cN2, pM1) - Signed by Truitt Merle, MD on 12/15/2018     Gallbladder cancer ([place])  10/04/2018 Pathology Results   10/04/2018 Surgical Pathology Diagnosis 1. Lymph node, biopsy, mediastinal - LYMPH NODE WITH METASTATIC ADENOCARCINOMA. - SEE MICROSCOPIC DESCRIPTION 2. Plaque, coronary artery - CALCIFIED ATHEROSCLEROTIC PLAQUE.   10/14/2018 Miscellaneous   Foundation One:  MSI stable tumor mutation burden 3Muts/mb ERBB2 amplification CCNE1 amplification TP53 mutation(+) CDK6 amplification  HGF amplification    11/13/2018 Cancer Staging   Staging form: Gallbladder, AJCC 8th Edition - Clinical stage from 11/13/2018: Stage IVB (cTX, cN2, pM1) - Signed by Truitt Merle, MD on 12/15/2018   11/17/2018 Imaging   11/17/2018 CT CAP IMPRESSION: 1. 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 This is associated with extensive upper abdominal and retroperitoneal lymphadenopathy, as well as metastatic lymphadenopathy in the posterior mediastinum and left supraclavicular region. There is also a metastatic lesion to the left ischium. 2. 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 3. Aortic atherosclerosis, in addition to left main and 3 vessel coronary artery disease. Status post median sternotomy for CABG including LIMA to the LAD. 4. There are calcifications of the aortic valve. Echocardiographic correlation for evaluation of potential  valvular dysfunction may be warranted if clinically indicated.    11/21/2018 Initial Diagnosis   Gallbladder cancer ([place])   11/27/2018 Pathology Results   11/27/2018 CA19-9 immunohistochemical stain Per request, a CA19-9 immunohistochemical stain was performed at an outside institution revealing positive staining in the tumor cells.    12/15/2018 -  Chemotherapy    first line chemo cisplatin and gemcitabine every 2 weeks on 12/15/18   02/20/2019 PET scan   Restaging PET scan:  IMPRESSION: 1. Positive response to therapy at all primary and metastatic sites. Persistent primary and metastatic lesions do have intense hypermetabolic activity albeit reduced. 2. Decrease in size and hypermetabolic activity of mass lesion in the gallbladder fundus. 3. Interval decrease in size and metabolic activity of periportal adenopathy. 4. Decrease in size and metabolic activity of periaortic lymph nodes. 5. Interval decrease in size and metabolic activity of destructive lesion in the LEFT inferior pubic ramus with new sclerosis. 6. No new or progressive disease.   05/29/2019 Imaging   CT CAP W Contrast  IMPRESSION: Known gallbladder adenocarcinoma, difficult to compare to recent PET, improved from prior CT.  Upper abdominal/retroperitoneal lymphadenopathy, slightly improved from prior PET.  Osseous metastasis involving the left inferior pubic ramus, grossly unchanged.  No evidence of metastatic disease in the chest.  Additional ancillary findings as above.   08/22/2019 Imaging   CT CAP W Contrast  IMPRESSION: 1. Stable soft tissue mass involving the gallbladder fundus. 2. Stable mild porta hepatis and retroperitoneal lymphadenopathy. 3. Stable sclerotic bone metastases. 4. No new or progressive metastatic disease identified. 5. Stable 2.3 cm left thyroid lobe nodule.     CURRENT THERAPY:  -First linechemo cisplatin and gemcitabineevery 2 weekson 12/15/18. Had 1 month chemo  breakin April, restarted on 04/20/19 -Zometa q12weeks starting 01/26/19  INTERVAL HISTORY:  Cassie Campbell returns for f/u as scheduled. She completed cycle 12 day 1 on 11/02/19. She feels well today except worsening left leg/hip pain 8/10. She saw ortho who ordered CT to be done today. He gave her oxycodone bc tylenol is no longer effective. She manages well at home, alone and independent. She has a Industrial/product designer and friend she can rely on. Over last 3 treatments she developed mild intermittent tingling in fingertips, none in toes. She functions without difficulty or limitations. Appetite was low last week but improved. Denies n/v/c/d or abdominal pain. Her baseline cough and exertional dyspnea are stable, denies chest pain, fever, chills, or leg edema.    MEDICAL HISTORY:  Past Medical History:  Diagnosis Date   Allergic rhinitis    Arthritis    Asthma    xolair s 8/05 ?11/07; mastered hfa 12/20/08   Benign positional vertigo    CAD (coronary artery disease)    a. 09/2014 NSTEMI s/p LHC with sig 2V dz. dLAD diffusely diseased and not suitable for PCI. unsuccessful RCA PCI d/t heavy calcifications   gallbladder ca dx'd 10/2018   GERD (gastroesophageal reflux disease)    Heart attack ([place])    09/2014   Hyperlipidemia    <130 ldl pos fm hx, bp   Hypertension    Osteopenia    dexa 08/22/07 AP spine + 1.1, left femur -1.3, right femur -.8; dexa 10/06/09 +1.6, left femur =1.6, right femur -.   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 breast bx--for microcalcifications in rt. retroareolar region--dx was hyalinized fibroadenoma   BUNIONECTOMY Bilateral    CARDIAC CATHETERIZATION     2015   CATARACT EXTRACTION Right 02/02/2018   Dr Satira Sark   CATARACT EXTRACTION Left 03/30/2018    CERVICAL [place] SURGERY  12/01   CORONARY ARTERY BYPASS GRAFT N/A 10/04/2018   Procedure: CORONARY ARTERY BYPASS GRAFTING (CABG) times 2 using left  Internal mammary artery to LAD, left greater saphenous vein - open harvest.;  Surgeon: [name], MD;  Location: [facility];  Service: Open Heart Surgery;  Laterality: N/A;   IR IMAGING GUIDED PORT INSERTION  12/08/2018   LEFT HEART CATH AND CORONARY ANGIOGRAPHY N/A 09/27/2018   Procedure: LEFT HEART CATH AND CORONARY ANGIOGRAPHY;  Surgeon: Troy Sine, MD;  Location: [place] [facility];  Service: Cardiovascular;  Laterality: N/A;   LEFT HEART CATHETERIZATION WITH CORONARY ANGIOGRAM N/A 10/16/2014   Procedure: LEFT HEART CATHETERIZATION WITH CORONARY ANGIOGRAM;  Surgeon: Blane Ohara, MD;  Location: [facility] CATH LAB;  Service: Cardiovascular;  Laterality: N/A;   Left L4-5 transforaminal lumbar interbody fusion with Depuy cage, rods and screws, local and allograft bone graft, Vivigen; bilateral decompression/partial hemilaminectomy lumbar five-sacral one  07/2017   PERCUTANEOUS CORONARY ROTOBLATOR INTERVENTION (PCI-R) N/A 10/17/2014   Procedure: PERCUTANEOUS CORONARY ROTOBLATOR INTERVENTION (PCI-R);  Surgeon: Troy Sine, MD;  Location: [facility] CATH LAB;  Service: Cardiovascular;  Laterality: N/A;   TEE WITHOUT CARDIOVERSION N/A 10/04/2018   Procedure: TRANSESOPHAGEAL ECHOCARDIOGRAM (TEE);  Surgeon: [name], MD;  Location: [place];  Service: Open Heart Surgery;  Laterality: N/A;   VEIN LIGATION AND STRIPPING Left    VIDEO BRONCHOSCOPY Bilateral 02/05/2015   Procedure: VIDEO BRONCHOSCOPY WITHOUT FLUORO;  Surgeon: Tanda Rockers, MD;  Location: [facility];  Service: Endoscopy;  Laterality: Bilateral;   VULVA /PERINEUM BIOPSY  12-30-10   --epidermoid cyst    I have reviewed the social history and family history with the patient and they are unchanged from previous note.  ALLERGIES:  is allergic to fish-derived products;  penicillins; aspirin; brilinta [ticagrelor]; codeine phosphate; and diphenhydramine hcl.  MEDICATIONS:  Current Outpatient Medications  Medication Sig Dispense Refill   albuterol (PROAIR HFA) 108 (90 Base) MCG/ACT inhaler INHALE 1 TO 2 PUFFS BY MOUTH EVERY 4 HOURS AS NEEDED FOR WHEEZING 8.5 g 3   atorvastatin (LIPITOR) 80 MG tablet TAKE 1 TABLET(80 MG) BY MOUTH DAILY 90 tablet 1   bisoprolol (ZEBETA) 10 MG tablet TAKE 1 TABLET(10 MG) BY MOUTH DAILY 90 tablet 1   budesonide-formoterol (SYMBICORT) 160-4.5 MCG/ACT inhaler INHALE 2 PUFFS BY MOUTH EVERY 12 HOURS 30.6 g 3   cetirizine (ZYRTEC) 10 MG tablet Take 10 mg by mouth at bedtime as needed for allergies.     cholecalciferol 2000 units TABS Take 1 tablet (2,000 Units total) by mouth daily. 30 tablet 3   clopidogrel (PLAVIX) 75 MG tablet Take 1 tablet (75 mg total) by mouth daily. 90 tablet 3   dextromethorphan-guaiFENesin (MUCINEX DM) 30-600 MG per 12 hr tablet Take 1-2 tablets by mouth every 12 (twelve) hours as needed for cough (with flutter).      famotidine (PEPCID) 20 MG tablet Take 20 mg by mouth as needed.      ferrous sulfate 325 (65 FE) MG tablet Take 650 mg by mouth daily with breakfast.      fluticasone (FLONASE) 50 MCG/ACT nasal spray Place 1-2 sprays into both nostrils 2 (two) times daily as needed for allergies or rhinitis.     furosemide (LASIX) 20 MG tablet Take 1 tablet (20 mg total) by mouth daily. 180 tablet 3   lidocaine-prilocaine (EMLA) cream Apply to affected area once 30 g 3   magic mouthwash SOLN Take 5 mLs by mouth 4 (four) times daily. Leave Benadryl out of compound since pt allergic to it. Use hydrocortisone & nystatin. Swish & swallow or spit for thrush 240 mL 1   magnesium oxide (MAGNESIUM-OXIDE) 400 (241.3 Mg) MG tablet Take 1 tablet (400 mg total) by mouth 3 (three) times daily. 90 tablet 1   meclizine (ANTIVERT) 25 MG tablet TAKE 2 TABLETS BY MOUTH THREE TIMES DAILY AS NEEDED 24 tablet 2    montelukast (SINGULAIR) 10 MG tablet TAKE 1 TABLET(10 MG) BY MOUTH AT BEDTIME 90 tablet 1   Multiple Vitamin (MULTIVITAMIN) capsule Take 1 capsule by mouth daily.      omeprazole (PRILOSEC) 20 MG capsule Take 20 mg by mouth as needed.      ondansetron (ZOFRAN) 8 MG tablet Take 1 tablet (8 mg total) by mouth 2 (two) times daily as needed. Start on the third day after chemotherapy. 30 tablet 1   oxycodone (OXY-IR) 5 MG capsule Take 1 capsule (5 mg total) by mouth every 4 (four) hours as needed. 30 capsule 0   prochlorperazine (COMPAZINE) 10 MG tablet Take 1 tablet (10 mg total) by mouth every 6 (six) hours as needed (Nausea or vomiting). 30 tablet 1   dexamethasone (DECADRON) 4 MG tablet Take 1 tablet (4 mg total) by mouth 2 (two) times daily with a meal. 20 tablet 0   No current facility-administered medications for this visit.     PHYSICAL EXAMINATION: ECOG PERFORMANCE STATUS: 1 - Symptomatic but completely ambulatory BP 138/77 P 88 T 98.5 RR 18  Wt 126  GENERAL:alert, no distress and comfortable SKIN: no rash  EYES:  sclera clear LUNGS: clear with normal breathing effort HEART: regular rate & rhythm, no lower extremity edema ABDOMEN:abdomen soft, non-tender and normal bowel sounds Musculoskeletal: focal tenderness to left ischial tuberosity  NEURO: alert & oriented x 3 with fluent speech, intact vibratory sense per tuning fork exam PAC without erythema   LABORATORY DATA:  I have reviewed the data as listed CBC Latest Ref Rng & Units 11/14/2019 11/02/2019 10/05/2019  WBC 4.0 - 10.5 K/uL 6.2 9.1 6.0  Hemoglobin 12.0 - 15.0 g/dL 9.7(L) 10.9(L) 9.7(L)  Hematocrit 36.0 - 46.0 % 30.6(L) 34.0(L) 30.4(L)  Platelets 150 - 400 K/uL 193 426(H) 216     CMP Latest Ref Rng & Units 11/14/2019 11/02/2019 10/05/2019  Glucose 70 - 99 mg/dL 100(H) 136(H) 97  BUN 8 - 23 mg/dL _0 Creatinine 0.44 - 1.00 mg/dL 0.97 1.05(H) 1.05(H)  Sodium 135 - 145 mmol/L 140 139 141  Potassium 3.5 -  5.1 mmol/L 3.7 3.7 5.2(H)  Chloride 98 - 111 mmol/L 101 99 104  CO2 22 - 32 mmol/L _1 Calcium 8.9 - 10.3 mg/dL 9.4 9.6 9.6  Total Protein 6.5 - 8.1 g/dL 8.1 9.0(H) 8.1  Total Bilirubin 0.3 - 1.2 mg/dL 0.2(L) 0.4 0.2(L)  Alkaline Phos 38 - 126 U/L 86 95 84  AST 15 - 41 U/L 32 26 30  ALT 0 - 44 U/L _2 RADIOGRAPHIC STUDIES: I have personally reviewed the radiological images as listed and agreed with the findings in the report. Xr Hip Unilat W Or W/o Pelvis 2-3 Views Left  Result Date: 11/14/2019 Left hip with osteophytes forming inferomedial left femoral head. There is narrowing of the inferomedial left hip joint and large exhuberant osteophytes forming along the lateral left ischium with osteogenic sclerotic changes in the left ischium. This may represent a bone building lesion or paget's disease localized to the left ischium. Moderate left hip OA.     ASSESSMENT & PLAN: Cassie Campbell is a 78 y.o. 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 -Started first line cisplatin and gemcitabine on 12/15/2018 q2 weeks due to advanced age and CHF -She is also on Zometa q31month starting 01/26/19 -Due to worsening fatigue her chemo was dose reduced by 20% and continues q2 weeks -Ms. BCloeappears well today. She completed cycle 12 day 1 chemo. She continues to tolerate treatment well overall.  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 Vibratory sense is intact. She functions without difficulty. I recommend to begin B complex vitamin and do cryotherapy during infusions.  -Labs adequate for treatment. Mg 1.5 today, will increase back to 5g in chemo hydration fluids. She continues oral mag TID -She will return on 11/27 to receive cycle 12 day 15, continue cis/gem q2 weeks -f/u in 2 weeks   2. Early CIPN -Developed with  cycle 11, mild intermittent tingling to fingertips -no functional difficulties  -tuning fork exam is normal -I recommend oral B complex vitamin and to try cryotherapy during infusions. She agrees.  -if CIPN worsens, will dose-reduce chemo  3. CAD, S/P CABGX2 on 09/27/2018, EF 40-45% -Currently on Plavix. She also takes lasix just once daily -f/u with Dr. KClaiborne Billings-stable  4. Asthma, dyspnea -Currently on Symbicort, Singulair, and albuterol as needed -f/u  with pulmonary -baseline cough and DOE are stable   5.Goal of care discussion -The patient understands the goal of care is palliative. -she is full code for now  6. Mild Anemia -Secondary to chemoand her open heart surgery -She did not take oral iron because it causes bloating.  -Hgb 9.7   7.Chronic left upper leg pain -she has acute on chronic left leg/hip pain. Worsened recently.  -She has focal tenderness at the left ischial tuberosity.  -saw ortho Dr. Louanne Skye today, CT to be done after today's visit -she was given Rx for oxycodone per ortho, I reviewed SE especially drowsiness and constipation. I recommend for her not to drive after taking it. She agrees.  -monitor  PLAN: -Labs reviewed  -Proceed with cycle 12 day 15 cis/gem on 11/27, increase Mg to 5 g in hydration fluids. I spoke with [place] in [place] B complex vitamin and cryotherapy for early CIPN -CT hip pending from today -f/u in 2 weeks with next cycle  All questions were answered. The patient knows to call the clinic with any problems, questions or concerns. No barriers to learning was detected. I spent 20 minutes counseling the patient face to face. The total time spent in the appointment was 25 minutes and more than 50% was on counseling and review of test results     Alla Feeling, NP 11/14/19

## 2019-11-14 NOTE — Progress Notes (Signed)
I s/w Cira Rue, NP re: Mg IVF. MG = 1.5 on 11/14/19  We'll increase Mg in hydration back to 5 g w/ each cycle unless Mg is high. We'll continue to monitor her Mg w/ each CDDP tx.  Kennith Center, Pharm.D., CPP 11/14/2019@4 :21 PM

## 2019-11-14 NOTE — Patient Instructions (Signed)
  Left hip is suffering from osteoarthritis, but there is also a suspicious area of the left pelvis that is making bone, only real proven treatments are Weight loss, pain meds for discomfort and use a cane in the hand opposite the painful hip. Well padded shoes help. Ice or hot shower 2-3 times a day 15-20 mins at a time. OxycodoneIR for pain. CT scan of the left hip ordered to assess bone changes left ischium.

## 2019-11-16 ENCOUNTER — Telehealth: Payer: Self-pay | Admitting: *Deleted

## 2019-11-16 ENCOUNTER — Inpatient Hospital Stay: Payer: Medicare Other

## 2019-11-16 ENCOUNTER — Other Ambulatory Visit: Payer: Self-pay

## 2019-11-16 VITALS — BP 148/77 | HR 89 | Temp 98.3°F | Resp 18

## 2019-11-16 DIAGNOSIS — Z7189 Other specified counseling: Secondary | ICD-10-CM

## 2019-11-16 DIAGNOSIS — C23 Malignant neoplasm of gallbladder: Secondary | ICD-10-CM

## 2019-11-16 DIAGNOSIS — Z5111 Encounter for antineoplastic chemotherapy: Secondary | ICD-10-CM | POA: Diagnosis not present

## 2019-11-16 MED ORDER — SODIUM CHLORIDE 0.9 % IV SOLN
Freq: Once | INTRAVENOUS | Status: AC
  Administered 2019-11-16: 11:00:00 via INTRAVENOUS
  Filled 2019-11-16: qty 5

## 2019-11-16 MED ORDER — FUROSEMIDE 10 MG/ML IJ SOLN
INTRAMUSCULAR | Status: AC
  Filled 2019-11-16: qty 2

## 2019-11-16 MED ORDER — PALONOSETRON HCL INJECTION 0.25 MG/5ML
0.2500 mg | Freq: Once | INTRAVENOUS | Status: AC
  Administered 2019-11-16: 0.25 mg via INTRAVENOUS

## 2019-11-16 MED ORDER — PALONOSETRON HCL INJECTION 0.25 MG/5ML
INTRAVENOUS | Status: AC
  Filled 2019-11-16: qty 5

## 2019-11-16 MED ORDER — SODIUM CHLORIDE 0.9 % IV SOLN
800.0000 mg/m2 | Freq: Once | INTRAVENOUS | Status: AC
  Administered 2019-11-16: 1216 mg via INTRAVENOUS
  Filled 2019-11-16: qty 31.98

## 2019-11-16 MED ORDER — POTASSIUM CHLORIDE 2 MEQ/ML IV SOLN
Freq: Once | INTRAVENOUS | Status: AC
  Administered 2019-11-16: 09:00:00 via INTRAVENOUS
  Filled 2019-11-16: qty 1000

## 2019-11-16 MED ORDER — HEPARIN SOD (PORK) LOCK FLUSH 100 UNIT/ML IV SOLN
500.0000 [IU] | Freq: Once | INTRAVENOUS | Status: AC | PRN
  Administered 2019-11-16: 500 [IU]
  Filled 2019-11-16: qty 5

## 2019-11-16 MED ORDER — SODIUM CHLORIDE 0.9 % IV SOLN
Freq: Once | INTRAVENOUS | Status: AC
  Administered 2019-11-16: 08:00:00 via INTRAVENOUS
  Filled 2019-11-16: qty 250

## 2019-11-16 MED ORDER — SODIUM CHLORIDE 0.9% FLUSH
10.0000 mL | INTRAVENOUS | Status: DC | PRN
  Administered 2019-11-16: 10 mL
  Filled 2019-11-16: qty 10

## 2019-11-16 MED ORDER — SODIUM CHLORIDE 0.9 % IV SOLN
20.0000 mg/m2 | Freq: Once | INTRAVENOUS | Status: AC
  Administered 2019-11-16: 31 mg via INTRAVENOUS
  Filled 2019-11-16: qty 31

## 2019-11-16 MED ORDER — FUROSEMIDE 10 MG/ML IJ SOLN
20.0000 mg | Freq: Once | INTRAMUSCULAR | Status: AC
  Administered 2019-11-16: 20 mg via INTRAVENOUS

## 2019-11-16 NOTE — Telephone Encounter (Signed)
Per Cira Rue, NP, seen pt in infusion to verify Mag supplement intake. Pt confirmed she is taking Mag x3 daily.

## 2019-11-16 NOTE — Telephone Encounter (Signed)
-----   Message from Alla Feeling, NP sent at 11/14/2019  4:09 PM EST ----- Cassie Campbell, when she comes back for chemo on Friday AM, please see her in infusion and verify she is taking Mag 3 times daily Thanks, Regan Rakers

## 2019-11-16 NOTE — Patient Instructions (Signed)
[facility] Discharge Instructions for Patients Receiving Chemotherapy  Today you received the following chemotherapy agents:  Cisplatin and Gemcitibine.  To help prevent nausea and vomiting after your treatment, we encourage you to take your nausea medication as prescribed. NO ZOFRAN FOR 3 DAYS AFTER CHEMO.   If you develop nausea and vomiting that is not controlled by your nausea medication, call the clinic.   BELOW ARE SYMPTOMS THAT SHOULD BE REPORTED IMMEDIATELY:  *FEVER GREATER THAN 100.5 F  *CHILLS WITH OR WITHOUT FEVER  NAUSEA AND VOMITING THAT IS NOT CONTROLLED WITH YOUR NAUSEA MEDICATION  *UNUSUAL SHORTNESS OF BREATH  *UNUSUAL BRUISING OR BLEEDING  TENDERNESS IN MOUTH AND THROAT WITH OR WITHOUT PRESENCE OF ULCERS  *URINARY PROBLEMS  *BOWEL PROBLEMS  UNUSUAL RASH Items with * indicate a potential emergency and should be followed up as soon as possible.  Feel free to call the clinic should you have any questions or concerns. The clinic phone number is [phone].  Please show the [facility] at check-in to the Emergency Department and triage nurse.

## 2019-11-17 MED ORDER — ACETAMINOPHEN 325 MG PO TABS
ORAL_TABLET | ORAL | Status: AC
  Filled 2019-11-17: qty 2

## 2019-11-17 MED ORDER — METHYLPREDNISOLONE SODIUM SUCC 125 MG IJ SOLR
INTRAMUSCULAR | Status: AC
  Filled 2019-11-17: qty 2

## 2019-11-17 MED ORDER — DIPHENHYDRAMINE HCL 25 MG PO CAPS
ORAL_CAPSULE | ORAL | Status: AC
  Filled 2019-11-17: qty 1

## 2019-11-28 NOTE — Progress Notes (Signed)
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 Lymph node, biopsy, mediastinal - LYMPH NODE WITH METASTATIC ADENOCARCINOMA. - SEE MICROSCOPIC DESCRIPTION 2. Plaque, coronary artery - CALCIFIED ATHEROSCLEROTIC PLAQUE.   10/14/2018 Miscellaneous   Foundation One:  MSI stable tumor mutation burden 3Muts/mb ERBB2 amplification CCNE1 amplification TP53 mutation(+) CDK6 amplification  HGF amplification    11/13/2018 Cancer Staging   Staging form: Gallbladder, AJCC 8th Edition - Clinical stage from 11/13/2018: Stage IVB (cTX, cN2, pM1) - Signed by Truitt Merle, MD on 12/15/2018   11/17/2018 Imaging   11/17/2018 CT CAP IMPRESSION: 1. 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 This is associated with extensive upper abdominal and retroperitoneal lymphadenopathy, as well as metastatic lymphadenopathy in the posterior mediastinum and left supraclavicular region. There is also a metastatic lesion to the left ischium. 2. 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 3. Aortic atherosclerosis, in addition to left main and 3 vessel coronary artery disease. Status post median sternotomy for CABG including LIMA to the LAD. 4. There are calcifications of the aortic valve. Echocardiographic correlation for  evaluation of potential valvular dysfunction may be warranted if clinically indicated.    11/21/2018 Initial Diagnosis   Gallbladder cancer ([place])   11/27/2018 Pathology Results   11/27/2018 CA19-9 immunohistochemical stain Per request, a CA19-9 immunohistochemical stain was performed at an outside institution revealing positive staining in the tumor cells.    12/15/2018 -  Chemotherapy    first line chemo cisplatin and gemcitabine every 2 weeks on 12/15/18   02/20/2019 PET scan   Restaging PET scan:  IMPRESSION: 1. Positive response to therapy at all primary and metastatic sites. Persistent primary and metastatic lesions do have intense hypermetabolic activity albeit reduced. 2. Decrease in size and hypermetabolic activity of mass lesion in the gallbladder fundus. 3. Interval decrease in size and metabolic activity of periportal adenopathy. 4. Decrease in size and metabolic activity of periaortic lymph nodes. 5. Interval decrease in size and metabolic activity of destructive lesion in the LEFT inferior pubic ramus with new sclerosis. 6. No new or progressive disease.   05/29/2019 Imaging   CT CAP W Contrast  IMPRESSION: Known gallbladder adenocarcinoma, difficult to compare to recent PET, improved from prior CT.  Upper abdominal/retroperitoneal lymphadenopathy, slightly improved from prior PET.  Osseous metastasis involving the left inferior pubic ramus, grossly unchanged.  No evidence of metastatic disease in the chest.  Additional ancillary findings as above.   08/22/2019 Imaging   CT CAP W Contrast  IMPRESSION: 1. Stable soft tissue mass involving the gallbladder fundus. 2. Stable mild porta hepatis and retroperitoneal lymphadenopathy. 3. Stable sclerotic bone metastases. 4. No new or progressive metastatic disease identified. 5. Stable 2.3 cm left thyroid lobe nodule.   11/14/2019 Imaging   CT Left Hip WO  contrast  IMPRESSION: 1. Stable chronic sclerotic  metastasis involving the left ischium. Associated soft tissue components  have progressed, but there is no evidence of pathologic fracture. 2. No acute osseous findings. 3. 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      CURRENT THERAPY:  -First linechemo cisplatin and gemcitabineevery 2 weekson 12/15/18. Had 1 month chemo breakin April, restarted on 04/20/19 -Zometa q12weeks starting 01/26/19  INTERVAL HISTORY:  Cassie Campbell is here for a follow up and treatment. She presents to the clinic alone. She notes she plans to get cortisone shot for her left leg pain on 12/17. She had CT scan 2 weeks ago with orthopedic surgeon. She notes her pain is stable. She notes her current oxycodone is not controlling her pain anymore. She takes '5mg'$  three times a day. She does ambulate with cane.     REVIEW OF SYSTEMS:   Constitutional: Denies fevers, chills or abnormal weight loss Eyes: Denies blurriness of vision Ears, nose, mouth, throat, and face: Denies mucositis or sore throat Respiratory: Denies cough, dyspnea or wheezes Cardiovascular: Denies palpitation, chest discomfort or lower extremity swelling Gastrointestinal:  Denies nausea, heartburn or change in bowel habits Skin: Denies abnormal skin rashes MSK: (+) Left leg pain Lymphatics: Denies new lymphadenopathy or easy bruising Neurological:Denies numbness, tingling or new weaknesses Behavioral/Psych: Mood is stable, no new changes  All other systems were reviewed with the patient and are negative.  MEDICAL HISTORY:  Past Medical History:  Diagnosis Date  . Allergic rhinitis   . Arthritis   . Asthma    xolair s 8/05 ?11/07; mastered hfa 12/20/08  . Benign positional vertigo   . CAD (coronary artery disease)    a. 09/2014 NSTEMI s/p LHC with sig 2V dz. dLAD diffusely diseased and not suitable for PCI. unsuccessful RCA PCI d/t heavy calcifications  . gallbladder ca dx'd 10/2018  . GERD (gastroesophageal reflux disease)   .  Heart attack ([place])    09/2014  . Hyperlipidemia    <130 ldl pos fm hx, bp  . Hypertension   . Osteopenia    dexa 08/22/07 AP spine + 1.1, left femur -1.3, right femur -.8; dexa 10/06/09 +1.6, left femur =1.6, right femur -.  . PONV (postoperative nausea and vomiting)   . Ruptured disc, cervical   . 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 BREAST SURGERY  10-26-10   Rt. breast bx--for microcalcifications in rt. retroareolar region--dx was hyalinized fibroadenoma  . BUNIONECTOMY Bilateral   . CARDIAC CATHETERIZATION     2015  . CATARACT EXTRACTION Right 02/02/2018   Dr Satira Sark  . CATARACT EXTRACTION Left 03/30/2018  . [place] [facility]GERY  12/01  . CORONARY ARTERY BYPASS GRAFT N/A 10/04/2018   Procedure: CORONARY ARTERY BYPASS GRAFTING (CABG) times 2 using left  Internal mammary artery to LAD, left greater saphenous vein - open harvest.;  Surgeon: [name], MD;  Location: [place];  Service: Open Heart Surgery;  Laterality: N/A;  . IR IMAGING GUIDED PORT INSERTION  12/08/2018  . LEFT HEART CATH AND CORONARY ANGIOGRAPHY N/A 09/27/2018   Procedure: LEFT HEART CATH AND CORONARY ANGIOGRAPHY;  Surgeon: Troy Sine, MD;  Location: [place] CV [facility];  Service: Cardiovascular;  Laterality: N/A;  . LEFT HEART CATHETERIZATION WITH CORONARY ANGIOGRAM N/A 10/16/2014   Procedure: LEFT HEART CATHETERIZATION WITH CORONARY ANGIOGRAM;  Surgeon: Blane Ohara, MD;  Location: [facility] CATH LAB;  Service: Cardiovascular;  Laterality: N/A;  . Left L4-5 transforaminal lumbar interbody fusion with Depuy cage,  rods and screws, local and allograft bone graft, Vivigen; bilateral decompression/partial hemilaminectomy lumbar five-sacral one  07/2017  . PERCUTANEOUS CORONARY ROTOBLATOR INTERVENTION (PCI-R) N/A 10/17/2014   Procedure: PERCUTANEOUS CORONARY ROTOBLATOR INTERVENTION (PCI-R);  Surgeon: Troy Sine, MD;  Location: [facility] CATH LAB;  Service: Cardiovascular;  Laterality: N/A;  . TEE WITHOUT CARDIOVERSION N/A 10/04/2018   Procedure: TRANSESOPHAGEAL ECHOCARDIOGRAM (TEE);  Surgeon: [name], MD;  Location: [place];  Service: Open Heart Surgery;  Laterality: N/A;  . VEIN LIGATION AND STRIPPING Left   . VIDEO BRONCHOSCOPY Bilateral 02/05/2015   Procedure: VIDEO BRONCHOSCOPY WITHOUT FLUORO;  Surgeon: Tanda Rockers, MD;  Location: [facility];  Service: Endoscopy;  Laterality: Bilateral;  . VULVA /PERINEUM BIOPSY  12-30-10   --epidermoid cyst    I have reviewed the social history and family history with the patient and they are unchanged from previous note.  ALLERGIES:  is allergic to fish-derived products; penicillins; aspirin; brilinta [ticagrelor]; codeine phosphate; and diphenhydramine hcl.  MEDICATIONS:  Current Outpatient Medications  Medication Sig Dispense Refill  . albuterol (PROAIR HFA) 108 (90 Base) MCG/ACT inhaler INHALE 1 TO 2 PUFFS BY MOUTH EVERY 4 HOURS AS NEEDED FOR WHEEZING 8.5 g 3  . atorvastatin (LIPITOR) 80 MG tablet TAKE 1 TABLET(80 MG) BY MOUTH DAILY 90 tablet 1  . bisoprolol (ZEBETA) 10 MG tablet TAKE 1 TABLET(10 MG) BY MOUTH DAILY 90 tablet 1  . budesonide-formoterol (SYMBICORT) 160-4.5 MCG/ACT inhaler INHALE 2 PUFFS BY MOUTH EVERY 12 HOURS 30.6 g 3  . cetirizine (ZYRTEC) 10 MG tablet Take 10 mg by mouth at bedtime as needed for allergies.    . cholecalciferol 2000 units TABS Take 1 tablet (2,000 Units total) by mouth daily. 30 tablet 3  . clopidogrel (PLAVIX) 75 MG tablet Take 1 tablet (75 mg total) by mouth daily. 90 tablet 3  . dexamethasone (DECADRON) 4 MG tablet Take 1 tablet (4 mg total) by mouth 2 (two) times daily with a meal. 20 tablet 0  . dextromethorphan-guaiFENesin (MUCINEX DM) 30-600 MG per 12 hr tablet Take 1-2 tablets by mouth every 12 (twelve) hours as needed for cough (with flutter).     . famotidine (PEPCID) 20 MG tablet Take 20 mg by  mouth as needed.     . ferrous sulfate 325 (65 FE) MG tablet Take 650 mg by mouth daily with breakfast.     . fluticasone (FLONASE) 50 MCG/ACT nasal spray Place 1-2 sprays into both nostrils 2 (two) times daily as needed for allergies or rhinitis.    . furosemide (LASIX) 20 MG tablet Take 1 tablet (20 mg total) by mouth daily. 180 tablet 3  . lidocaine-prilocaine (EMLA) cream Apply to affected area once 30 g 3  . magic mouthwash SOLN Take 5 mLs by mouth 4 (four) times daily. Leave Benadryl out of compound since pt allergic to it. Use hydrocortisone & nystatin. Swish & swallow or spit for thrush 240 mL 1  . magnesium oxide (MAGNESIUM-OXIDE) 400 (241.3 Mg) MG tablet Take 1 tablet (400 mg total) by mouth 3 (three) times daily. 90 tablet 1  . meclizine (ANTIVERT) 25 MG tablet TAKE 2 TABLETS BY MOUTH THREE TIMES DAILY AS NEEDED 24 tablet 2  . montelukast (SINGULAIR) 10 MG tablet TAKE 1 TABLET(10 MG) BY MOUTH AT BEDTIME 90 tablet 1  . Multiple Vitamin (MULTIVITAMIN) capsule Take 1 capsule by mouth daily.     Marland Kitchen omeprazole (PRILOSEC) 20 MG capsule Take 20 mg by mouth as needed.     Marland Kitchen  ondansetron (ZOFRAN) 8 MG tablet Take 1 tablet (8 mg total) by mouth 2 (two) times daily as needed. Start on the third day after chemotherapy. 30 tablet 1  . prochlorperazine (COMPAZINE) 10 MG tablet Take 1 tablet (10 mg total) by mouth every 6 (six) hours as needed (Nausea or vomiting). 30 tablet 1  . gabapentin (NEURONTIN) 100 MG capsule Take 1 capsule (100 mg total) by mouth 3 (three) times daily. Start at 137m at night first, and gradually increase to 3069mat night, and 10053mm and 100m92m over the next month 90 capsule 0  . oxyCODONE (OXY IR/ROXICODONE) 5 MG immediate release tablet Take 1 tablet (5 mg total) by mouth every 6 (six) hours as needed for severe pain. 30 tablet 0   No current facility-administered medications for this visit.   Facility-Administered Medications Ordered in Other Visits  Medication Dose  Route Frequency Provider Last Rate Last Admin  . CISplatin (PLATINOL) 31 mg in sodium chloride 0.9 % 250 mL chemo infusion  20 mg/m2 (Treatment Plan Recorded) Intravenous Once FengTruitt Merle      . fosaprepitant (EMEND) 150 mg, dexamethasone (DECADRON) 12 mg in sodium chloride 0.9 % 145 mL IVPB   Intravenous Once FengTruitt Merle      . furosemide (LASIX) injection 20 mg  20 mg Intravenous Once FengTruitt Merle      . gemcitabine (GEMZAR) 1,216 mg in sodium chloride 0.9 % 250 mL chemo infusion  800 mg/m2 (Treatment Plan Recorded) Intravenous Once FengTruitt Merle      . heparin lock flush 100 unit/mL  500 Units Intracatheter Once PRN FengTruitt Merle      . palonosetron (ALOXI) injection 0.25 mg  0.25 mg Intravenous Once FengTruitt Merle      . sodium chloride flush (NS) 0.9 % injection 10 mL  10 mL Intracatheter PRN FengTruitt Merle        PHYSICAL EXAMINATION: ECOG PERFORMANCE STATUS: 1 - Symptomatic but completely ambulatory  Vitals:   11/30/19 0949  BP: 119/64  Pulse: 84  Resp: 18  Temp: 97.8 F (36.6 C)  SpO2: 97%   Filed Weights   11/30/19 0949  Weight: 124 lb 9.6 oz (56.5 kg)    GENERAL:alert, no distress and comfortable SKIN: skin color, texture, turgor are normal, no rashes or significant lesions EYES: normal, Conjunctiva are pink and non-injected, sclera clear  NECK: supple, thyroid normal size, non-tender, without nodularity LYMPH:  no palpable lymphadenopathy in the cervical, axillary  LUNGS: clear to auscultation and percussion with normal breathing effort HEART: regular rate & rhythm and no murmurs and no lower extremity edema ABDOMEN:abdomen soft, non-tender and normal bowel sounds Musculoskeletal:no cyanosis of digits and no clubbing  NEURO: alert & oriented x 3 with fluent speech, no focal motor/sensory deficits  LABORATORY DATA:  I have reviewed the data as listed CBC Latest Ref Rng & Units 11/30/2019 11/14/2019 11/02/2019  WBC 4.0 - 10.5 K/uL 6.1 6.2 9.1  Hemoglobin 12.0 -  15.0 g/dL 9.8(L) 9.7(L) 10.9(L)  Hematocrit 36.0 - 46.0 % 30.7(L) 30.6(L) 34.0(L)  Platelets 150 - 400 K/uL 256 193 426(H)     CMP Latest Ref Rng & Units 11/30/2019 11/14/2019 11/02/2019  Glucose 70 - 99 mg/dL 103(H) 100(H) 136(H)  BUN 8 - 23 mg/dL _0 Creatinine 0.44 - 1.00 mg/dL 0.85 0.97 1.05(H)  Sodium 135 - 145 mmol/L 139 140 139  Potassium 3.5 - 5.1 mmol/L 3.5 3.7 3.7  Chloride 98 -  111 mmol/L 100 101 99  CO2 22 - 32 mmol/L _0 Calcium 8.9 - 10.3 mg/dL 9.1 9.4 9.6  Total Protein 6.5 - 8.1 g/dL 8.0 8.1 9.0(H)  Total Bilirubin 0.3 - 1.2 mg/dL 0.3 0.2(L) 0.4  Alkaline Phos 38 - 126 U/L 83 86 95  AST 15 - 41 U/L 31 32 26  ALT 0 - 44 U/L _1 RADIOGRAPHIC STUDIES: I have personally reviewed the radiological images as listed and agreed with the findings in the report. No results found.   ASSESSMENT & PLAN:  Cassie Campbell is a 78 y.o. 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 Tolerating well. -She is also on Zometa q43month starting 01/26/19 -she has developed left hip/thigh pain. We discussed her 11/14/19 CT hip which shows stable left ischium lesion and progression of surrounding soft tissue. Her pain is likely related to cancer progression. I recommend PET scan or CT CAP/Bone Scan to evaluate for cancer. She is agreeable.  -[facility]  -Labs reviewed and adequate to proceed with Cis/Gem today, same dose.  -F/u in 2 weeks with NP Lacie or sooner if PET scan will be done next week    2.Left upper leg pain -she has left hip surgeries. -She currently ambulates with cane  -Her CT left hip form 11/14/19 shows stable metastasis involving left  ischium and progressed soft tissue around it. I discussed her pain is likely related to her cancer. Will obtain PET can for further evaluation.  -She plans to start cortisone injections with Orthopedic surgeon on 12/06/19.  -Her oxycodone 518mTID is no longer controlling her pain and not covered by insurance in capsule form. I will fill her will pill and increase to every 6 hours (11/30/19) -I will also call in Gabapentin to start once nightly and if not enough she can titrate up and use during the day.  -If scan indicates her pain is related to cancer, I will refer her to [facility] palliative and target RT to help her pain and her cancer.    3. CAD, S/P CABGX2 on 09/27/2018, EF 40-45% -Currently on Plavix. She also takes lasix during chemo. -f/u with Dr. KeClaiborne Billingsstable,she appears to be euvolemic  4. Asthma, dyspnea -Currently on Symbicort, Singulair, and albuterol as needed -f/u with pulmonary, stable, no recent flare -She has recently beenlittle more dyspneic. She can use dexa after chemo, and as needed for worsening wheezing  5.Goal of care discussion -The patient understands the goal of care is palliative. -she is full code for now  6. Mild Anemia -Secondary to chemoand her recent open heart surgery -I discussed her anemia may contribute to her mild SOB. -She has not taken oral iron because it causes bloating. I encouraged her to take prenatal vitamin.   Plan -I called in Gabapentin and refilled Oxycodone 8m70mab PRN  -Lab reviewed, adequate to proceed with cisplatin and gemcitabine today -rad/onc referral for left hip pain  -PET scan or CT CAP/Bone scan in 1-2 weeks, f/u after scan    No problem-specific Assessment & Plan notes found for this encounter.   Orders Placed This Encounter  Procedures  . NM PET Image Restag (  PS) Skull Base To Thigh    Standing Status:   Future    Standing Expiration Date:   11/29/2020    Order Specific Question:   If  indicated for the ordered procedure, I authorize the administration of a radiopharmaceutical per Radiology protocol    Answer:   Yes    Order Specific Question:   Preferred imaging location?    Answer:   [facility]    Order Specific Question:   Radiology Contrast Protocol - do NOT remove file path    Answer:   [email or web address]  . Ambulatory referral to Radiation Oncology    Referral Priority:   Urgent    Referral Type:   Consultation    Referral Reason:   Specialty Services Required    Requested Specialty:   Radiation Oncology    Number of Visits Requested:   1   All questions were answered. The patient knows to call the clinic with any problems, questions or concerns. No barriers to learning was detected. I spent 20 minutes counseling the patient face to face. The total time spent in the appointment was 25 minutes and more than 50% was on counseling and review of test results     Truitt Merle, MD 11/30/2019   I, Joslyn Devon, am acting as scribe for Truitt Merle, MD.   I have reviewed the above documentation for accuracy and completeness, and I agree with the above.

## 2019-11-30 ENCOUNTER — Inpatient Hospital Stay: Payer: Medicare Other

## 2019-11-30 ENCOUNTER — Inpatient Hospital Stay (HOSPITAL_BASED_OUTPATIENT_CLINIC_OR_DEPARTMENT_OTHER): Payer: Medicare Other | Admitting: Hematology

## 2019-11-30 ENCOUNTER — Inpatient Hospital Stay: Payer: Medicare Other | Attending: Obstetrics

## 2019-11-30 ENCOUNTER — Other Ambulatory Visit: Payer: Self-pay

## 2019-11-30 ENCOUNTER — Encounter: Payer: Self-pay | Admitting: Hematology

## 2019-11-30 VITALS — BP 119/64 | HR 84 | Temp 97.8°F | Resp 18 | Ht 59.0 in | Wt 124.6 lb

## 2019-11-30 DIAGNOSIS — C23 Malignant neoplasm of gallbladder: Secondary | ICD-10-CM

## 2019-11-30 DIAGNOSIS — Z5111 Encounter for antineoplastic chemotherapy: Secondary | ICD-10-CM | POA: Diagnosis present

## 2019-11-30 DIAGNOSIS — C7951 Secondary malignant neoplasm of bone: Secondary | ICD-10-CM | POA: Insufficient documentation

## 2019-11-30 DIAGNOSIS — Z452 Encounter for adjustment and management of vascular access device: Secondary | ICD-10-CM | POA: Diagnosis not present

## 2019-11-30 DIAGNOSIS — J45909 Unspecified asthma, uncomplicated: Secondary | ICD-10-CM | POA: Insufficient documentation

## 2019-11-30 DIAGNOSIS — I2510 Atherosclerotic heart disease of native coronary artery without angina pectoris: Secondary | ICD-10-CM | POA: Diagnosis not present

## 2019-11-30 DIAGNOSIS — G893 Neoplasm related pain (acute) (chronic): Secondary | ICD-10-CM | POA: Diagnosis not present

## 2019-11-30 DIAGNOSIS — Z7189 Other specified counseling: Secondary | ICD-10-CM

## 2019-11-30 DIAGNOSIS — D649 Anemia, unspecified: Secondary | ICD-10-CM | POA: Insufficient documentation

## 2019-11-30 DIAGNOSIS — I10 Essential (primary) hypertension: Secondary | ICD-10-CM

## 2019-11-30 DIAGNOSIS — R202 Paresthesia of skin: Secondary | ICD-10-CM | POA: Insufficient documentation

## 2019-11-30 LAB — CBC WITH DIFFERENTIAL (CANCER CENTER ONLY)
Abs Immature Granulocytes: 0.02 10*3/uL (ref 0.00–0.07)
Basophils Absolute: 0 10*3/uL (ref 0.0–0.1)
Basophils Relative: 1 %
Eosinophils Absolute: 0.6 10*3/uL — ABNORMAL HIGH (ref 0.0–0.5)
Eosinophils Relative: 10 %
HCT: 30.7 % — ABNORMAL LOW (ref 36.0–46.0)
Hemoglobin: 9.8 g/dL — ABNORMAL LOW (ref 12.0–15.0)
Immature Granulocytes: 0 %
Lymphocytes Relative: 11 %
Lymphs Abs: 0.7 10*3/uL (ref 0.7–4.0)
MCH: 32.5 pg (ref 26.0–34.0)
MCHC: 31.9 g/dL (ref 30.0–36.0)
MCV: 101.7 fL — ABNORMAL HIGH (ref 80.0–100.0)
Monocytes Absolute: 0.7 10*3/uL (ref 0.1–1.0)
Monocytes Relative: 11 %
Neutro Abs: 4.1 10*3/uL (ref 1.7–7.7)
Neutrophils Relative %: 67 %
Platelet Count: 256 10*3/uL (ref 150–400)
RBC: 3.02 MIL/uL — ABNORMAL LOW (ref 3.87–5.11)
RDW: 17.3 % — ABNORMAL HIGH (ref 11.5–15.5)
WBC Count: 6.1 10*3/uL (ref 4.0–10.5)
nRBC: 0 % (ref 0.0–0.2)

## 2019-11-30 LAB — CMP (CANCER CENTER ONLY)
ALT: 11 U/L (ref 0–44)
AST: 31 U/L (ref 15–41)
Albumin: 3.4 g/dL — ABNORMAL LOW (ref 3.5–5.0)
Alkaline Phosphatase: 83 U/L (ref 38–126)
Anion gap: 11 (ref 5–15)
BUN: 11 mg/dL (ref 8–23)
CO2: 28 mmol/L (ref 22–32)
Calcium: 9.1 mg/dL (ref 8.9–10.3)
Chloride: 100 mmol/L (ref 98–111)
Creatinine: 0.85 mg/dL (ref 0.44–1.00)
GFR, Est AFR Am: >60 mL/min (ref >60)
GFR, Estimated: >60 mL/min (ref >60)
Glucose, Bld: 103 mg/dL — ABNORMAL HIGH (ref 70–99)
Potassium: 3.5 mmol/L (ref 3.5–5.1)
Sodium: 139 mmol/L (ref 135–145)
Total Bilirubin: 0.3 mg/dL (ref 0.3–1.2)
Total Protein: 8 g/dL (ref 6.5–8.1)

## 2019-11-30 LAB — MAGNESIUM: Magnesium: 1.7 mg/dL (ref 1.7–2.4)

## 2019-11-30 MED ORDER — FUROSEMIDE 10 MG/ML IJ SOLN
20.0000 mg | Freq: Once | INTRAMUSCULAR | Status: AC
  Administered 2019-11-30: 20 mg via INTRAVENOUS

## 2019-11-30 MED ORDER — OXYCODONE HCL 5 MG PO TABS: 5.0000 mg | ORAL_TABLET | Freq: Four times a day (QID) | ORAL | 0 refills | Status: DC | PRN

## 2019-11-30 MED ORDER — PALONOSETRON HCL INJECTION 0.25 MG/5ML
INTRAVENOUS | Status: AC
  Filled 2019-11-30: qty 5

## 2019-11-30 MED ORDER — SODIUM CHLORIDE 0.9 % IV SOLN
20.0000 mg/m2 | Freq: Once | INTRAVENOUS | Status: AC
  Administered 2019-11-30: 15:00:00 31 mg via INTRAVENOUS
  Filled 2019-11-30: qty 31

## 2019-11-30 MED ORDER — PALONOSETRON HCL INJECTION 0.25 MG/5ML
0.2500 mg | Freq: Once | INTRAVENOUS | Status: AC
  Administered 2019-11-30: 13:00:00 0.25 mg via INTRAVENOUS

## 2019-11-30 MED ORDER — POTASSIUM CHLORIDE 2 MEQ/ML IV SOLN
Freq: Once | INTRAVENOUS | Status: AC
  Administered 2019-11-30: 11:00:00 via INTRAVENOUS
  Filled 2019-11-30: qty 1000

## 2019-11-30 MED ORDER — GABAPENTIN 100 MG PO CAPS: 100.0000 mg | ORAL_CAPSULE | Freq: Three times a day (TID) | ORAL | 0 refills | Status: DC

## 2019-11-30 MED ORDER — HEPARIN SOD (PORK) LOCK FLUSH 100 UNIT/ML IV SOLN
500.0000 [IU] | Freq: Once | INTRAVENOUS | Status: AC | PRN
  Administered 2019-11-30: 500 [IU]
  Filled 2019-11-30: qty 5

## 2019-11-30 MED ORDER — SODIUM CHLORIDE 0.9 % IV SOLN
Freq: Once | INTRAVENOUS | Status: AC
  Administered 2019-11-30: 11:00:00 via INTRAVENOUS
  Filled 2019-11-30: qty 250

## 2019-11-30 MED ORDER — SODIUM CHLORIDE 0.9 % IV SOLN
800.0000 mg/m2 | Freq: Once | INTRAVENOUS | Status: AC
  Administered 2019-11-30: 14:00:00 1216 mg via INTRAVENOUS
  Filled 2019-11-30: qty 31.98

## 2019-11-30 MED ORDER — SODIUM CHLORIDE 0.9% FLUSH
10.0000 mL | INTRAVENOUS | Status: DC | PRN
  Administered 2019-11-30: 18:00:00 10 mL
  Filled 2019-11-30: qty 10

## 2019-11-30 MED ORDER — SODIUM CHLORIDE 0.9 % IV SOLN
Freq: Once | INTRAVENOUS | Status: AC
  Administered 2019-11-30: 13:00:00 via INTRAVENOUS
  Filled 2019-11-30: qty 5

## 2019-11-30 MED ORDER — FUROSEMIDE 10 MG/ML IJ SOLN
INTRAMUSCULAR | Status: AC
  Filled 2019-11-30: qty 2

## 2019-11-30 NOTE — Patient Instructions (Signed)
[facility] Discharge Instructions for Patients Receiving Chemotherapy  Today you received the following chemotherapy agents:  Cisplatin and Gemcitibine.  To help prevent nausea and vomiting after your treatment, we encourage you to take your nausea medication as prescribed. NO ZOFRAN FOR 3 DAYS AFTER CHEMO.   If you develop nausea and vomiting that is not controlled by your nausea medication, call the clinic.   BELOW ARE SYMPTOMS THAT SHOULD BE REPORTED IMMEDIATELY:  *FEVER GREATER THAN 100.5 F  *CHILLS WITH OR WITHOUT FEVER  NAUSEA AND VOMITING THAT IS NOT CONTROLLED WITH YOUR NAUSEA MEDICATION  *UNUSUAL SHORTNESS OF BREATH  *UNUSUAL BRUISING OR BLEEDING  TENDERNESS IN MOUTH AND THROAT WITH OR WITHOUT PRESENCE OF ULCERS  *URINARY PROBLEMS  *BOWEL PROBLEMS  UNUSUAL RASH Items with * indicate a potential emergency and should be followed up as soon as possible.  Feel free to call the clinic should you have any questions or concerns. The clinic phone number is [phone].  Please show the [place] at check-in to the Emergency Department and triage nurse.

## 2019-11-30 NOTE — Patient Instructions (Signed)

## 2019-12-01 LAB — CANCER ANTIGEN 19-9: CA 19-9: 2476 U/mL — ABNORMAL HIGH (ref 0–35)

## 2019-12-03 ENCOUNTER — Telehealth: Payer: Self-pay

## 2019-12-03 ENCOUNTER — Telehealth: Payer: Self-pay | Admitting: Hematology

## 2019-12-03 NOTE — Telephone Encounter (Signed)
Patient calls wanting to know if she can take Gabapentin with the Oxycodone for her pain?  [phone]

## 2019-12-03 NOTE — Telephone Encounter (Signed)
I would caution her against using them together. In her age, it may increase sedation/fall risk.  Thanks, Regan Rakers

## 2019-12-03 NOTE — Telephone Encounter (Signed)
Spoke with patient and advised her to not take the Gabapentin and Oxycodone at the same time as it may make her overly sedated and increase her risk for falling.  She verbalized an understanding.

## 2019-12-03 NOTE — Telephone Encounter (Signed)
No los per 12/11.

## 2019-12-04 ENCOUNTER — Encounter: Payer: Self-pay | Admitting: Radiation Oncology

## 2019-12-04 ENCOUNTER — Ambulatory Visit
Admission: RE | Admit: 2019-12-04 | Discharge: 2019-12-04 | Disposition: A | Discharge status: Home / Self Care | Payer: Medicare Other | Source: Ambulatory Visit | Attending: Radiation Oncology | Admitting: Radiation Oncology

## 2019-12-04 ENCOUNTER — Other Ambulatory Visit: Payer: Self-pay

## 2019-12-04 VITALS — Ht 59.0 in | Wt 124.0 lb

## 2019-12-04 DIAGNOSIS — C7951 Secondary malignant neoplasm of bone: Secondary | ICD-10-CM

## 2019-12-04 DIAGNOSIS — M25552 Pain in left hip: Secondary | ICD-10-CM

## 2019-12-04 DIAGNOSIS — G893 Neoplasm related pain (acute) (chronic): Secondary | ICD-10-CM | POA: Insufficient documentation

## 2019-12-04 DIAGNOSIS — C23 Malignant neoplasm of gallbladder: Secondary | ICD-10-CM

## 2019-12-04 DIAGNOSIS — M25559 Pain in unspecified hip: Secondary | ICD-10-CM

## 2019-12-04 NOTE — Progress Notes (Signed)
Histology and Location of Primary Cancer: Metastatic gallbladder cancer  Sites of Visceral and Bony Metastatic Disease:  10/04/2018: Lymph node biopsy LYMPH NODE WITH METASTATIC ADENOCARCINOMA. 11/17/2018: CT of CAP Large heterogeneously enhancing gallbladder mass, likely to reflect a primary gallbladder neoplasm. This is associated with extensive upper abdominal and retroperitoneal lymphadenopathy, as well as metastatic lymphadenopathy in the posterior mediastinum and left supraclavicular region. There is also a metastatic lesion to the left ischium. 11/14/2019: CT LEFT hip w/o contrast Stable chronic sclerotic metastasis involving the left ischium  Location(s) of Symptomatic Metastases:  Left hip  Past/Anticipated chemotherapy by medical oncology, if any:  Currently receiving systemic Gemcitabine/Cisplatin infusions every 2 weeks.  Pain on a scale of 0-10 is: Currently no pain while taking prn pain medication   Ambulatory status? Walker? Wheelchair?: Ambulatory; occasionally uses a cane if left hip pain it too severe and impacts mobility  SAFETY ISSUES:  Prior radiation? No  Pacemaker/ICD? No  Possible current pregnancy? No  Is the patient on methotrexate? No  Current Complaints / other details:  Patient currently has no new complaints or concerns. She states that since she started taking her pain medication her left hip has not bothered her, and her mobility has improved. She is apprehensive to starting radiation concurrently while receiving systemic chemotherapy, but is willing to discuss options with Alean Rinne and Dr. Lisbeth Renshaw.

## 2019-12-04 NOTE — Progress Notes (Signed)
Radiation Oncology         [phone] ________________________________  Initial Outpatient Consultation - Conducted via telephone due to current COVID-19 concerns for limiting patient exposure  I spoke with the patient to conduct this consult visit via telephone to spare the patient unnecessary potential exposure in the healthcare setting during the current COVID-19 pandemic. The patient was notified in advance and was offered a [place] meeting to allow for face to face communication but unfortunately reported that they did not have the appropriate resources/technology to support such a visit and instead preferred to proceed with a telephone consult.   ________________________________  Name: Cassie Campbell        MRN: [id number]  Date of Service: 12/04/2019 DOB: 1941-07-30  [id number], Christena Deem, MD  Truitt Merle, MD     REFERRING PHYSICIAN: Truitt Merle, MD   DIAGNOSIS: The primary encounter diagnosis was Pain of left hip joint. Diagnoses of Gallbladder cancer ([place]), Bone metastasis ([place]), and Pain from bone metastases [facility]) were also pertinent to this visit.   HISTORY OF PRESENT ILLNESS: Cassie Campbell is a 77 y.o. female seen at the request of Dr. Burr Medico for a history of metastatic gallbladder cancer. The patient was originally diagnosed in 2019 at the time of heart surgery when a lymph node in the mediastinum that was submitted for pathology and was consistent with a metastatic adenocarcinoma, further staging identified the primary as most consistent with a cholangiocarcinoma. Staging PET scan on 2018-11-13 revealed irregular soft tissue involving the wall of the gallbladder fundus and inferior liver with marked hypermetabolism, bulky upper abdominal adenopathy, supraclavicular, mediastinal hilar and abdominal adenopathy was noted. A large hypermetabolic lesion in the posterior left pelvis was also identified. She began systemic chemotherapy under the care of Dr. Burr Medico since 2018-12-15. Her  lesion in the left ischium/acetabulum was noted to be progressive, and she is due for a repeat PET scan next week. Because of the increase in pain in the site involving the left pelvis, she is contacted today to discuss the options of palliative radiotherapy. Of note she was scheduled to follow-up with orthopedics on Thursday to consider a steroid joint injection.   PREVIOUS RADIATION THERAPY: No   PAST MEDICAL HISTORY:  Past Medical History:  Diagnosis Date  . Allergic rhinitis   . Arthritis   . Asthma    xolair s 8/05 ?11/07; mastered hfa 12/20/08  . Benign positional vertigo   . CAD (coronary artery disease)    a. 09/2014 NSTEMI s/p LHC with sig 2V dz. dLAD diffusely diseased and not suitable for PCI. unsuccessful RCA PCI d/t heavy calcifications  . gallbladder ca dx'd 10/2018  . GERD (gastroesophageal reflux disease)   . Heart attack ([place])    09/2014  . Hyperlipidemia    <130 ldl pos fm hx, bp  . Hypertension   . Osteopenia    dexa 08/22/07 AP spine + 1.1, left femur -1.3, right femur -.8; dexa 10/06/09 +1.6, left femur =1.6, right femur -.  . PONV (postoperative nausea and vomiting)   . Ruptured disc, cervical   . Spondylolisthesis at L4-L5 level    With Neurogenic Claudication       PAST SURGICAL HISTORY: Past Surgical History:  Procedure Laterality Date  . BREAST SURGERY  10-26-10   Rt. breast bx--for microcalcifications in rt. retroareolar region--dx was hyalinized fibroadenoma  . BUNIONECTOMY Bilateral   . CARDIAC CATHETERIZATION     2015  . CATARACT EXTRACTION Right 02/02/2018   Dr Satira Sark  .  CATARACT EXTRACTION Left 03/30/2018  . [place] SURGERY  12/01  . CORONARY ARTERY BYPASS GRAFT N/A 10/04/2018   Procedure: CORONARY ARTERY BYPASS GRAFTING (CABG) times 2 using left  Internal mammary artery to LAD, left greater saphenous vein - open harvest.;  Surgeon: [name], MD;  Location: [place];  Service: Open Heart Surgery;  Laterality: N/A;  . IR IMAGING  GUIDED PORT INSERTION  12/08/2018  . LEFT HEART CATH AND CORONARY ANGIOGRAPHY N/A 09/27/2018   Procedure: LEFT HEART CATH AND CORONARY ANGIOGRAPHY;  Surgeon: Troy Sine, MD;  Location: [place] CV LAB;  Service: Cardiovascular;  Laterality: N/A;  . LEFT HEART CATHETERIZATION WITH CORONARY ANGIOGRAM N/A 10/16/2014   Procedure: LEFT HEART CATHETERIZATION WITH CORONARY ANGIOGRAM;  Surgeon: Blane Ohara, MD;  Location: [facility] CATH LAB;  Service: Cardiovascular;  Laterality: N/A;  . Left L4-5 transforaminal lumbar interbody fusion with Depuy cage, rods and screws, local and allograft bone graft, Vivigen; bilateral decompression/partial hemilaminectomy lumbar five-sacral one  07/2017  . PERCUTANEOUS CORONARY ROTOBLATOR INTERVENTION (PCI-R) N/A 10/17/2014   Procedure: PERCUTANEOUS CORONARY ROTOBLATOR INTERVENTION (PCI-R);  Surgeon: Troy Sine, MD;  [facility] C[facility] LAB;  Service: Cardiovascular;  Laterality: N/A;  . TEE WITHOUT CARDIOVERSION N/A 10/04/2018   Procedure: TRANSESOPHAGEAL ECHOCARDIOGRAM (TEE);  Surgeon: [name], MD;  Location: [place];  Service: Open Heart Surgery;  Laterality: N/A;  . VEIN LIGATION AND STRIPPING Left   . VIDEO BRONCHOSCOPY Bilateral 02/05/2015   Procedure: VIDEO BRONCHOSCOPY WITHOUT FLUORO;  Surgeon: Tanda Rockers, MD;  Location: [facility] ENDOS[facility];  Service: Endoscopy;  Laterality: Bilateral;  . VULVA /PERINEUM BIOPSY  12-30-10   --epidermoid cyst     FAMILY HISTORY:  Family History  Problem Relation Age of Onset  . Diabetes Mother        AODM  . Stroke Mother   . Hypertension Mother   . Heart disease Father   . Diabetes Sister        AODM  . Hypertension Sister   . Breast cancer Daughter 65       dec--mets to liver/spine     SOCIAL HISTORY:  reports that she has never smoked. She has never used smokeless tobacco. She reports that she does not drink alcohol or use drugs. The patient is widowed and lives in [place].   ALLERGIES:  Fish-derived products, Penicillins, Aspirin, Brilinta [ticagrelor], Codeine phosphate, and Diphenhydramine hcl   MEDICATIONS:  Current Outpatient Medications  Medication Sig Dispense Refill  . albuterol (PROAIR HFA) 108 (90 Base) MCG/ACT inhaler INHALE 1 TO 2 PUFFS BY MOUTH EVERY 4 HOURS AS NEEDED FOR WHEEZING 8.5 g 3  . atorvastatin (LIPITOR) 80 MG tablet TAKE 1 TABLET(80 MG) BY MOUTH DAILY 90 tablet 1  . bisoprolol (ZEBETA) 10 MG tablet TAKE 1 TABLET(10 MG) BY MOUTH DAILY 90 tablet 1  . budesonide-formoterol (SYMBICORT) 160-4.5 MCG/ACT inhaler INHALE 2 PUFFS BY MOUTH EVERY 12 HOURS 30.6 g 3  . cetirizine (ZYRTEC) 10 MG tablet Take 10 mg by mouth at bedtime as needed for allergies.    . cholecalciferol 2000 units TABS Take 1 tablet (2,000 Units total) by mouth daily. 30 tablet 3  . clopidogrel (PLAVIX) 75 MG tablet Take 1 tablet (75 mg total) by mouth daily. 90 tablet 3  . dexamethasone (DECADRON) 4 MG tablet Take 1 tablet (4 mg total) by mouth 2 (two) times daily with a meal. 20 tablet 0  . dextromethorphan-guaiFENesin (MUCINEX DM) 30-600 MG per 12 hr tablet Take 1-2  tablets by mouth every 12 (twelve) hours as needed for cough (with flutter).     . famotidine (PEPCID) 20 MG tablet Take 20 mg by mouth as needed.     . ferrous sulfate 325 (65 FE) MG tablet Take 650 mg by mouth daily with breakfast.     . fluticasone (FLONASE) 50 MCG/ACT nasal spray Place 1-2 sprays into both nostrils 2 (two) times daily as needed for allergies or rhinitis.    . furosemide (LASIX) 20 MG tablet Take 1 tablet (20 mg total) by mouth daily. 180 tablet 3  . gabapentin (NEURONTIN) 100 MG capsule Take 1 capsule (100 mg total) by mouth 3 (three) times daily. Start at 100mg  at night first, and gradually increase to 300mg  at night, and 100mg  am and 100mg  pm over the next month 90 capsule 0  . lidocaine-prilocaine (EMLA) cream Apply to affected area once 30 g 3  . magic mouthwash SOLN Take 5 mLs by mouth 4 (four) times  daily. Leave Benadryl out of compound since pt allergic to it. Use hydrocortisone & nystatin. Swish & swallow or spit for thrush 240 mL 1  . magnesium oxide (MAGNESIUM-OXIDE) 400 (241.3 Mg) MG tablet Take 1 tablet (400 mg total) by mouth 3 (three) times daily. 90 tablet 1  . meclizine (ANTIVERT) 25 MG tablet TAKE 2 TABLETS BY MOUTH THREE TIMES DAILY AS NEEDED 24 tablet 2  . montelukast (SINGULAIR) 10 MG tablet TAKE 1 TABLET(10 MG) BY MOUTH AT BEDTIME 90 tablet 1  . Multiple Vitamin (MULTIVITAMIN) capsule Take 1 capsule by mouth daily.     Marland Kitchen omeprazole (PRILOSEC) 20 MG capsule Take 20 mg by mouth as needed.     . ondansetron (ZOFRAN) 8 MG tablet Take 1 tablet (8 mg total) by mouth 2 (two) times daily as needed. Start on the third day after chemotherapy. 30 tablet 1  . oxyCODONE (OXY IR/ROXICODONE) 5 MG immediate release tablet Take 1 tablet (5 mg total) by mouth every 6 (six) hours as needed for severe pain. 30 tablet 0  . prochlorperazine (COMPAZINE) 10 MG tablet Take 1 tablet (10 mg total) by mouth every 6 (six) hours as needed (Nausea or vomiting). 30 tablet 1   No current facility-administered medications for this encounter.     REVIEW OF SYSTEMS: On review of systems, the patient reports that she is doing okay. She reports she has been having some trouble with weight bearing and at times is having more stiffness in her left hip and leg in the mornings. She reports she also has a history of arthritis and has had previous cortisone injections in the same hip. She reports that she finds some relief with pain medication but at times this persists. She denies any chest pain, shortness of breath, cough, fevers, chills, night sweats, unintended weight changes. She denies any bowel or bladder disturbances, and denies abdominal pain, nausea or vomiting. She denies any other new musculoskeletal or joint aches or pains, and specifically denies any mid back pain. A complete review of systems is obtained and is  otherwise negative.     PHYSICAL EXAM:  Unable to assess due to encounter type.   ECOG = 1  0 - Asymptomatic (Fully active, able to carry on all predisease activities without restriction)  1 - Symptomatic but completely ambulatory (Restricted in physically strenuous activity but ambulatory and able to carry out work of a light or sedentary nature. For example, light housework, office work)  2 - Symptomatic, <50% in bed  during the day (Ambulatory and capable of all self care but unable to carry out any work activities. Up and about more than 50% of waking hours)  3 - Symptomatic, >50% in bed, but not bedbound (Capable of only limited self-care, confined to bed or chair 50% or more of waking hours)  4 - Bedbound (Completely disabled. Cannot carry on any self-care. Totally confined to bed or chair)  5 - Death   Eustace Pen MM, Creech RH, Tormey DC, et al. [phone]). "Toxicity and response criteria of the [facility]". [place] Oncol. 5 (6): 649-55    LABORATORY DATA:  Lab Results  Component Value Date   WBC 6.1 11/30/2019   HGB 9.8 (L) 11/30/2019   HCT 30.7 (L) 11/30/2019   MCV 101.7 (H) 11/30/2019   PLT 256 11/30/2019   Lab Results  Component Value Date   NA 139 11/30/2019   K 3.5 11/30/2019   CL 100 11/30/2019   CO2 28 11/30/2019   Lab Results  Component Value Date   ALT 11 11/30/2019   AST 31 11/30/2019   ALKPHOS 83 11/30/2019   BILITOT 0.3 11/30/2019      RADIOGRAPHY: CT HIP LEFT WO CONTRAST  Result Date: 11/14/2019 CLINICAL DATA:  Left hip pain. History of gallbladder cancer on chemotherapy. EXAM: CT OF THE LEFT HIP WITHOUT CONTRAST TECHNIQUE: Multidetector CT imaging of the left hip was performed according to the standard protocol. Multiplanar CT image reconstructions were also generated. COMPARISON:  Left hip radiographs 11/14/2019. Pelvic CT 08/22/2019 and 05/29/2019. PET-CT 02/20/2019. FINDINGS: Bones/Joint/Cartilage A chronic sclerotic  metastasis involving the left ischium is unchanged. The bone remains mildly expanded laterally without pathologic fracture. Associated soft tissue components both medially and laterally have progressed from previous PET-CT, measuring up to 7.3 cm in total on axial image 34/3. There is a stable small sclerotic lesion in the left superior pubic ramus. The proximal left femur is intact without focal abnormality. Mild osteitis pubis noted. There are mild left hip degenerative changes without significant joint effusion. Ligaments Suboptimally assessed by CT. Muscles and Tendons Left common hamstring tendinosis without tear. No focal muscular abnormalities. Soft tissues Iliofemoral atherosclerosis. No focal soft tissue masses or left inguinal adenopathy. There are surgical clips in the left groin. IMPRESSION: 1. 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 Associated soft tissue components have progressed, but there is no evidence of pathologic fracture. 2. No acute osseous findings. 3. 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 Electronically Signed   By: Richardean Sale M.D.   On: 11/14/2019 16:56   XR HIP UNILAT W OR W/O PELVIS 2-3 VIEWS LEFT  Result Date: 11/14/2019 Left hip with osteophytes forming inferomedial left femoral head. There is narrowing of the inferomedial left hip joint and large exhuberant osteophytes forming along the lateral left ischium with osteogenic sclerotic changes in the left ischium. This may represent a bone building lesion or paget's disease localized to the left ischium. Moderate left hip OA.       IMPRESSION/PLAN: 1. 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 Dr. Lisbeth Renshaw discusses the pathology findings and reviews the nature of metastatic caner to the bone. He discusses the rationale to consider a palliative course of radiotherapy. She does have disease in other sites such as the T9 vertebral body but is not  symptomatic in this site, nor does it appear to be in a site that would cause neurologic compromise. Rather, her pain is problematic in  the left hip area. She would be a candidate in this site for treatment.  We discussed the risks, benefits, short, and long term effects of radiotherapy, and the patient is interested in proceeding. Dr. Lisbeth Renshaw discusses the delivery and logistics of radiotherapy and anticipates a course of 3 weeks of radiotherapy given the plan for ongoing systemic therapy. She is planning to dc her appointment with orthopedics as well. She will simulate this week and will be contacted by our staff to coordinate a time to do so. She will also sign written consent to proceed when she comes to simulate. 2. Pain from bone metastasis. We are hopeful that with radiotherapy she can minimize or discontinue narcotic pain medication and have more functionality with weight bearing activity.   Given current concerns for patient exposure during the COVID-19 pandemic, this encounter was conducted via telephone.  The patient has given verbal consent for this type of encounter. The time spent during this encounter was 35 minutes and 50% of that time was spent in the coordination of her care. The attendants for this meeting include Dr. Lisbeth Renshaw, Shona Simpson, [facility] and Lucille Passy  During the encounter, Dr. Lisbeth Renshaw and Shona Simpson [facility] were located at [facility] Radiation Oncology Department.  Cassie Campbell  was located at home.  The above documentation reflects my direct findings during this shared patient visit. Please see the separate note by Dr. Lisbeth Renshaw on this date for the remainder of the patient's plan of care.    Carola Rhine, PAC

## 2019-12-04 NOTE — Patient Instructions (Signed)
What You Need to Know about Radiation Exposure from Procedures Radiation is a form of energy that can enter the cells in your body. Radiation is used in some common medical procedures. X-rays and gamma rays are some of the most common types of radiation used in imaging tests. These rays let health care providers create images of the organs and tissues inside the body to help them make a diagnosis. Radiation can also change cells and sometimes destroy them. Exposure to too much radiation can cause health conditions, such as cancer. Which procedures involve radiation? Imaging tests that use radiation include:  X-rays.  Mammograms.  Bone density scans.  CT scans.  PET scans.  Fluoroscopy. In addition to imaging tests, there is a type of cancer treatment that uses radiation to destroy cancer cells (radiation therapy). How much radiation is safe? No known level of radiation is considered completely safe. Experts think that exposure to low doses of radiation probably does not affect your health. Radiation exposure from most common medical procedures is low. Imaging tests are important tools to diagnose many treatable diseases and health conditions. Your health care provider will consider whether the benefits of the procedure are greater than the risks from radiation exposure. What are the risks of radiation exposure? Risks of radiation exposure depend on:  The test you have. Each test and each machine exposes you to a different amount of radiation.  Which area of your body is being tested. Some organs and tissues can be more sensitive to radiation than others. The more radiation you are exposed to, the greater the risk of cell damage and potential health problems. There is no set number of procedures involving radiation that is considered safe or unsafe. Exposure to extremely high levels of radiation, like in a nuclear accident, can eventually cause cancer. Monitor your skin for any changes  after a radiation exposure, and report any concerns to your health care provider. What are some questions to ask my health care provider? Talk with your health care provider about your other risk factors for cancer and how many previous procedures have exposed you to radiation. Discuss the benefits and risks of having additional procedures that expose you to radiation. If you are having a procedure that will expose you to radiation, ask your health care provider:  Do I need this procedure to diagnose my condition?  Could a procedure that does not involve radiation be used instead?  Is there a way to reduce my radiation exposure during the test? This information is not intended to replace advice given to you by your health care provider. Make sure you discuss any questions you have with your health care provider. Document Released: 12/21/2015 Document Revised: 12/23/2016 Document Reviewed: 12/21/2015 Elsevier Patient Education  2020 [employer].

## 2019-12-05 ENCOUNTER — Ambulatory Visit: Payer: Medicare Other | Admitting: Radiation Oncology

## 2019-12-06 ENCOUNTER — Ambulatory Visit: Payer: Medicare Other | Admitting: Physical Medicine and Rehabilitation

## 2019-12-07 ENCOUNTER — Other Ambulatory Visit: Payer: Self-pay

## 2019-12-07 ENCOUNTER — Ambulatory Visit
Admission: RE | Admit: 2019-12-07 | Discharge: 2019-12-07 | Disposition: A | Discharge status: Home / Self Care | Payer: Medicare Other | Source: Ambulatory Visit | Attending: Radiation Oncology | Admitting: Radiation Oncology

## 2019-12-07 DIAGNOSIS — C7951 Secondary malignant neoplasm of bone: Secondary | ICD-10-CM | POA: Insufficient documentation

## 2019-12-07 DIAGNOSIS — Z510 Encounter for antineoplastic radiation therapy: Secondary | ICD-10-CM | POA: Insufficient documentation

## 2019-12-07 DIAGNOSIS — C23 Malignant neoplasm of gallbladder: Secondary | ICD-10-CM | POA: Diagnosis not present

## 2019-12-08 ENCOUNTER — Other Ambulatory Visit: Payer: Self-pay | Admitting: Cardiovascular Disease

## 2019-12-08 ENCOUNTER — Other Ambulatory Visit: Payer: Self-pay | Admitting: Hematology

## 2019-12-11 ENCOUNTER — Other Ambulatory Visit: Payer: Self-pay

## 2019-12-11 ENCOUNTER — Ambulatory Visit (HOSPITAL_COMMUNITY)
Admission: RE | Admit: 2019-12-11 | Discharge: 2019-12-11 | Disposition: A | Discharge status: Home / Self Care | Payer: Medicare Other | Source: Ambulatory Visit | Attending: Hematology | Admitting: Hematology

## 2019-12-11 DIAGNOSIS — I700 Atherosclerosis of aorta: Secondary | ICD-10-CM | POA: Insufficient documentation

## 2019-12-11 DIAGNOSIS — R590 Localized enlarged lymph nodes: Secondary | ICD-10-CM | POA: Diagnosis not present

## 2019-12-11 DIAGNOSIS — I2510 Atherosclerotic heart disease of native coronary artery without angina pectoris: Secondary | ICD-10-CM | POA: Insufficient documentation

## 2019-12-11 DIAGNOSIS — E041 Nontoxic single thyroid nodule: Secondary | ICD-10-CM | POA: Diagnosis not present

## 2019-12-11 DIAGNOSIS — C7951 Secondary malignant neoplasm of bone: Secondary | ICD-10-CM | POA: Insufficient documentation

## 2019-12-11 DIAGNOSIS — I517 Cardiomegaly: Secondary | ICD-10-CM | POA: Diagnosis not present

## 2019-12-11 DIAGNOSIS — J328 Other chronic sinusitis: Secondary | ICD-10-CM | POA: Diagnosis not present

## 2019-12-11 DIAGNOSIS — J9811 Atelectasis: Secondary | ICD-10-CM | POA: Insufficient documentation

## 2019-12-11 DIAGNOSIS — C23 Malignant neoplasm of gallbladder: Secondary | ICD-10-CM | POA: Diagnosis present

## 2019-12-11 LAB — GLUCOSE, CAPILLARY: Glucose-Capillary: 101 mg/dL — ABNORMAL HIGH (ref 70–99)

## 2019-12-11 MED ORDER — FLUDEOXYGLUCOSE F - 18 (FDG) INJECTION
7.2100 | Freq: Once | INTRAVENOUS | Status: AC
  Administered 2019-12-11: 12:00:00 7.21 via INTRAVENOUS

## 2019-12-13 ENCOUNTER — Inpatient Hospital Stay: Payer: Medicare Other

## 2019-12-13 ENCOUNTER — Encounter: Payer: Self-pay | Admitting: Nurse Practitioner

## 2019-12-13 ENCOUNTER — Inpatient Hospital Stay (HOSPITAL_BASED_OUTPATIENT_CLINIC_OR_DEPARTMENT_OTHER): Payer: Medicare Other | Admitting: Nurse Practitioner

## 2019-12-13 ENCOUNTER — Other Ambulatory Visit: Payer: Self-pay

## 2019-12-13 VITALS — BP 130/77 | HR 77 | Temp 98.2°F | Resp 17 | Ht 59.0 in | Wt 123.0 lb

## 2019-12-13 DIAGNOSIS — Z5111 Encounter for antineoplastic chemotherapy: Secondary | ICD-10-CM | POA: Diagnosis not present

## 2019-12-13 DIAGNOSIS — C23 Malignant neoplasm of gallbladder: Secondary | ICD-10-CM

## 2019-12-13 LAB — CBC WITH DIFFERENTIAL (CANCER CENTER ONLY)
Abs Immature Granulocytes: 0.05 10*3/uL (ref 0.00–0.07)
Basophils Absolute: 0.1 10*3/uL (ref 0.0–0.1)
Basophils Relative: 1 %
Eosinophils Absolute: 0.7 10*3/uL — ABNORMAL HIGH (ref 0.0–0.5)
Eosinophils Relative: 11 %
HCT: 31.1 % — ABNORMAL LOW (ref 36.0–46.0)
Hemoglobin: 10 g/dL — ABNORMAL LOW (ref 12.0–15.0)
Immature Granulocytes: 1 %
Lymphocytes Relative: 13 %
Lymphs Abs: 0.8 10*3/uL (ref 0.7–4.0)
MCH: 32.6 pg (ref 26.0–34.0)
MCHC: 32.2 g/dL (ref 30.0–36.0)
MCV: 101.3 fL — ABNORMAL HIGH (ref 80.0–100.0)
Monocytes Absolute: 0.6 10*3/uL (ref 0.1–1.0)
Monocytes Relative: 10 %
Neutro Abs: 4.2 10*3/uL (ref 1.7–7.7)
Neutrophils Relative %: 64 %
Platelet Count: 233 10*3/uL (ref 150–400)
RBC: 3.07 MIL/uL — ABNORMAL LOW (ref 3.87–5.11)
RDW: 17.4 % — ABNORMAL HIGH (ref 11.5–15.5)
WBC Count: 6.5 10*3/uL (ref 4.0–10.5)
nRBC: 0 % (ref 0.0–0.2)

## 2019-12-13 LAB — CMP (CANCER CENTER ONLY)
ALT: 10 U/L (ref 0–44)
AST: 31 U/L (ref 15–41)
Albumin: 3.3 g/dL — ABNORMAL LOW (ref 3.5–5.0)
Alkaline Phosphatase: 81 U/L (ref 38–126)
Anion gap: 10 (ref 5–15)
BUN: 10 mg/dL (ref 8–23)
CO2: 29 mmol/L (ref 22–32)
Calcium: 9.2 mg/dL (ref 8.9–10.3)
Chloride: 100 mmol/L (ref 98–111)
Creatinine: 0.88 mg/dL (ref 0.44–1.00)
GFR, Est AFR Am: >60 mL/min (ref >60)
GFR, Estimated: >60 mL/min (ref >60)
Glucose, Bld: 101 mg/dL — ABNORMAL HIGH (ref 70–99)
Potassium: 3.9 mmol/L (ref 3.5–5.1)
Sodium: 139 mmol/L (ref 135–145)
Total Bilirubin: 0.3 mg/dL (ref 0.3–1.2)
Total Protein: 8.2 g/dL — ABNORMAL HIGH (ref 6.5–8.1)

## 2019-12-13 LAB — MAGNESIUM: Magnesium: 1.6 mg/dL — ABNORMAL LOW (ref 1.7–2.4)

## 2019-12-13 MED ORDER — HEPARIN SOD (PORK) LOCK FLUSH 100 UNIT/ML IV SOLN
500.0000 [IU] | Freq: Once | INTRAVENOUS | Status: AC | PRN
  Administered 2019-12-13: 500 [IU]
  Filled 2019-12-13: qty 5

## 2019-12-13 MED ORDER — SODIUM CHLORIDE 0.9% FLUSH
10.0000 mL | Freq: Once | INTRAVENOUS | Status: AC | PRN
  Administered 2019-12-13: 10 mL
  Filled 2019-12-13: qty 10

## 2019-12-13 MED ORDER — SODIUM CHLORIDE 0.9% FLUSH
10.0000 mL | Freq: Once | INTRAVENOUS | Status: AC | PRN
  Administered 2019-12-13: 10:00:00 10 mL
  Filled 2019-12-13: qty 10

## 2019-12-13 NOTE — Progress Notes (Signed)
[place]   [phone] [phone]) [phone]   Clinic Follow up Note   Patient Care Team: Tanda Rockers, MD as PCP - General (Pulmonary Disease) Troy Sine, MD as PCP - Cardiology (Cardiology) 12/13/2019  CHIEF COMPLAINT: F/u gallbladder cancer   SUMMARY OF ONCOLOGIC HISTORY: Oncology History Overview Note  Cancer Staging Gallbladder cancer [facility]) Staging form: Gallbladder, AJCC 8th Edition - Clinical stage from 11/13/2018: Stage IVB (cTX, cN2, pM1) - Signed by Cassie Merle, MD on 12/15/2018     Gallbladder cancer ([place])  10/04/2018 Pathology Results   10/04/2018 Surgical Pathology Diagnosis 1. Lymph node, biopsy, mediastinal - LYMPH NODE WITH METASTATIC ADENOCARCINOMA. - SEE MICROSCOPIC DESCRIPTION 2. Plaque, coronary artery - CALCIFIED ATHEROSCLEROTIC PLAQUE.   10/14/2018 Miscellaneous   Foundation One:  MSI stable tumor mutation burden 3Muts/mb ERBB2 amplification CCNE1 amplification TP53 mutation(+) CDK6 amplification  HGF amplification    11/13/2018 Cancer Staging   Staging form: Gallbladder, AJCC 8th Edition - Clinical stage from 11/13/2018: Stage IVB (cTX, cN2, pM1) - Signed by Cassie Merle, MD on 12/15/2018   11/17/2018 Imaging   11/17/2018 CT CAP IMPRESSION: 1. 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 This is associated with extensive upper abdominal and retroperitoneal lymphadenopathy, as well as metastatic lymphadenopathy in the posterior mediastinum and left supraclavicular region. There is also a metastatic lesion to the left ischium. 2. 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 3. Aortic atherosclerosis, in addition to left main and 3 vessel coronary artery disease. Status post median sternotomy for CABG including LIMA to the LAD. 4. There are calcifications of the aortic valve. Echocardiographic correlation for evaluation of potential valvular  dysfunction may be warranted if clinically indicated.    11/21/2018 Initial Diagnosis   Gallbladder cancer ([place])   11/27/2018 Pathology Results   11/27/2018 CA19-9 immunohistochemical stain Per request, a CA19-9 immunohistochemical stain was performed at an outside institution revealing positive staining in the tumor cells.    12/15/2018 -  Chemotherapy    first line chemo cisplatin and gemcitabine every 2 weeks on 12/15/18   02/20/2019 PET scan   Restaging PET scan:  IMPRESSION: 1. Positive response to therapy at all primary and metastatic sites. Persistent primary and metastatic lesions do have intense hypermetabolic activity albeit reduced. 2. Decrease in size and hypermetabolic activity of mass lesion in the gallbladder fundus. 3. Interval decrease in size and metabolic activity of periportal adenopathy. 4. Decrease in size and metabolic activity of periaortic lymph nodes. 5. Interval decrease in size and metabolic activity of destructive lesion in the LEFT inferior pubic ramus with new sclerosis. 6. No new or progressive disease.   05/29/2019 Imaging   CT CAP W Contrast  IMPRESSION: Known gallbladder adenocarcinoma, difficult to compare to recent PET, improved from prior CT.  Upper abdominal/retroperitoneal lymphadenopathy, slightly improved from prior PET.  Osseous metastasis involving the left inferior pubic ramus, grossly unchanged.  No evidence of metastatic disease in the chest.  Additional ancillary findings as above.   08/22/2019 Imaging   CT CAP W Contrast  IMPRESSION: 1. Stable soft tissue mass involving the gallbladder fundus. 2. Stable mild porta hepatis and retroperitoneal lymphadenopathy. 3. Stable sclerotic bone metastases. 4. No new or progressive metastatic disease identified. 5. Stable 2.3 cm left thyroid lobe nodule.   11/14/2019 Imaging   CT Left Hip WO  contrast  IMPRESSION: 1. Stable chronic sclerotic metastasis involving the left  ischium. Associated soft tissue components have progressed, but there is no  evidence of pathologic fracture. 2. No acute osseous findings. 3. 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   12/11/2019 PET scan   IMPRESSION: 1. Significant progression of malignancy, with increased activity in the gallbladder mass; considerable increase in size and activity of porta hepatis, retroperitoneal, and upper pelvic adenopathy; significant increase in size and metabolic activity of the left ischial metastatic lesion with extensive extraosseous component; and new mildly hypermetabolic left supraclavicular and thoracic periaortic lymph nodes suspicious for malignant involvement. 2. Faintly increased activity in a left thyroid nodule which was not previously hypermetabolic. Given the relatively low-grade activity, this may be amenable to surveillance, but a significant minority of thyroid nodules with hypermetabolic activity can represent thyroid cancer. 3. Other imaging findings of potential clinical significance: Chronic paranasal sinusitis. Chronic right middle lobe atelectasis. Aortic Atherosclerosis (ICD10-I70.0). Coronary atherosclerosis with cardiomegaly.     CURRENT THERAPY:  -First linechemo cisplatin and gemcitabineevery 2 weekson 12/15/18. Had 1 month chemo breakin April, restarted on 04/20/19 -Zometa q12weeks starting 01/26/19   INTERVAL HISTORY: Cassie Campbell returns for f/u and treatment. She had a PET scan recently. She continues to note left leg pain which is stable. She doesn't let it limit activity. Takes gabapentin and oxycodone 3-4 times daily. She uses a cane. Denies new or worsening pain. Appetite is stable. Fatigue fluctuates but she remains functional and independent. Denies n/v/c/d. Asthma is controlled. No fever or chills. No recent falls. Denies neuropathy.    MEDICAL HISTORY:  Past Medical History:  Diagnosis Date  . Allergic rhinitis   . Arthritis   . Asthma      xolair s 8/05 ?11/07; mastered hfa 12/20/08  . Benign positional vertigo   . CAD (coronary artery disease)    a. 09/2014 NSTEMI s/p LHC with sig 2V dz. dLAD diffusely diseased and not suitable for PCI. unsuccessful RCA PCI d/t heavy calcifications  . gallbladder ca dx'd 10/2018  . GERD (gastroesophageal reflux disease)   . Heart attack ([place])    09/2014  . Hyperlipidemia    <130 ldl pos fm hx, bp  . Hypertension   . Osteopenia    dexa 08/22/07 AP spine + 1.1, left femur -1.3, right femur -.8; dexa 10/06/09 +1.6, left femur =1.6, right femur -.  . PONV (postoperative nausea and vomiting)   . Ruptured disc, cervical   . 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 BREAST SURGERY  10-26-10   Rt. breast bx--for microcalcifications in rt. retroareolar region--dx was hyalinized fibroadenoma  . BUNIONECTOMY Bilateral   . CARDIAC CATHETERIZATION     2015  . CATARACT EXTRACTION Right 02/02/2018   Dr Satira Sark  . CATARACT EXTRACTION Left 03/30/2018  . [place] SURGERY  12/01  . CORONARY ARTERY BYPASS GRAFT N/A 10/04/2018   Procedure: CORONARY ARTERY BYPASS GRAFTING (CABG) times 2 using left  Internal mammary artery to LAD, left greater saphenous vein - open harvest.;  Surgeon: [name], MD;  Location: [place] [facility];  Service: Open Heart Surgery;  Laterality: N/A;  . IR IMAGING GUIDED PORT INSERTION  12/08/2018  . LEFT HEART CATH AND CORONARY ANGIOGRAPHY N/A 09/27/2018   Procedure: LEFT HEART CATH AND CORONARY ANGIOGRAPHY;  Surgeon: Troy Sine, MD;  Location: [place] CV LAB;  Service: Cardiovascular;  Laterality: N/A;  . LEFT HEART CATHETERIZATION WITH CORONARY ANGIOGRAM N/A 10/16/2014   Procedure: LEFT HEART CATHETERIZATION WITH CORONARY ANGIOGRAM;  Surgeon: Blane Ohara,  MD;  Location: [facility] CATH LAB;  Service: Cardiovascular;  Laterality: N/A;  . Left L4-5 transforaminal lumbar  interbody fusion with Depuy cage, rods and screws, local and allograft bone graft, Vivigen; bilateral decompression/partial hemilaminectomy lumbar five-sacral one  07/2017  . PERCUTANEOUS CORONARY ROTOBLATOR INTERVENTION (PCI-R) N/A 10/17/2014   Procedure: PERCUTANEOUS CORONARY ROTOBLATOR INTERVENTION (PCI-R);  Surgeon: Troy Sine, MD;  [facility] CATH LAB;  Service: Cardiovascular;  Laterality: N/A;  . TEE WITHOUT CARDIOVERSION N/A 10/04/2018   Procedure: TRANSESOPHAGEAL ECHOCARDIOGRAM (TEE);  Surgeon: [name], MD;  Location: [place];  Service: Open Heart Surgery;  Laterality: N/A;  . VEIN LIGATION AND STRIPPING Left   . VIDEO BRONCHOSCOPY Bilateral 02/05/2015   Procedure: VIDEO BRONCHOSCOPY WITHOUT FLUORO;  Surgeon: Tanda Rockers, MD;  Location: [facility] ENDOS[facility];  Service: Endoscopy;  Laterality: Bilateral;  . VULVA /PERINEUM BIOPSY  12-30-10   --epidermoid cyst    I have reviewed the social history and family history with the patient and they are unchanged from previous note.  ALLERGIES:  is allergic to fish-derived products; penicillins; aspirin; brilinta [ticagrelor]; codeine phosphate; and diphenhydramine hcl.  MEDICATIONS:  Current Outpatient Medications  Medication Sig Dispense Refill  . albuterol (PROAIR HFA) 108 (90 Base) MCG/ACT inhaler INHALE 1 TO 2 PUFFS BY MOUTH EVERY 4 HOURS AS NEEDED FOR WHEEZING 8.5 g 3  . atorvastatin (LIPITOR) 80 MG tablet TAKE 1 TABLET(80 MG) BY MOUTH DAILY 90 tablet 1  . bisoprolol (ZEBETA) 10 MG tablet TAKE 1 TABLET(10 MG) BY MOUTH DAILY 90 tablet 1  . budesonide-formoterol (SYMBICORT) 160-4.5 MCG/ACT inhaler INHALE 2 PUFFS BY MOUTH EVERY 12 HOURS 30.6 g 3  . cetirizine (ZYRTEC) 10 MG tablet Take 10 mg by mouth at bedtime as needed for allergies.    . cholecalciferol 2000 units TABS Take 1 tablet (2,000 Units total) by mouth daily. 30 tablet 3  . clopidogrel (PLAVIX) 75 MG tablet TAKE 1 TABLET(75 MG) BY MOUTH DAILY 90 tablet 3  .  dexamethasone (DECADRON) 4 MG tablet Take 1 tablet (4 mg total) by mouth 2 (two) times daily with a meal. (Patient taking differently: Take 4 mg by mouth 2 (two) times daily with a meal. Takes prn SOB after chemo) 20 tablet 0  . dextromethorphan-guaiFENesin (MUCINEX DM) 30-600 MG per 12 hr tablet Take 1-2 tablets by mouth every 12 (twelve) hours as needed for cough (with flutter).     . famotidine (PEPCID) 20 MG tablet Take 20 mg by mouth as needed.     . ferrous sulfate 325 (65 FE) MG tablet Take 650 mg by mouth daily with breakfast.     . fluticasone (FLONASE) 50 MCG/ACT nasal spray Place 1-2 sprays into both nostrils 2 (two) times daily as needed for allergies or rhinitis.    . furosemide (LASIX) 20 MG tablet Take 1 tablet (20 mg total) by mouth daily. 180 tablet 3  . gabapentin (NEURONTIN) 100 MG capsule Take 1 capsule (100 mg total) by mouth 3 (three) times daily. Start at 125m at night first, and gradually increase to 3061mat night, and 10064mm and 100m64m over the next month 90 capsule 0  . lidocaine-prilocaine (EMLA) cream Apply to affected area once 30 g 3  . magic mouthwash SOLN Take 5 mLs by mouth 4 (four) times daily. Leave Benadryl out of compound since pt allergic to it. Use hydrocortisone & nystatin. Swish & swallow or spit for thrush 240 mL 1  . MAGNESIUM-OXIDE 400 (241.3 Mg) MG tablet TAKE  1 TABLET(400 MG) BY MOUTH THREE TIMES DAILY 90 tablet 1  . meclizine (ANTIVERT) 25 MG tablet TAKE 2 TABLETS BY MOUTH THREE TIMES DAILY AS NEEDED 24 tablet 2  . montelukast (SINGULAIR) 10 MG tablet TAKE 1 TABLET(10 MG) BY MOUTH AT BEDTIME 90 tablet 1  . Multiple Vitamin (MULTIVITAMIN) capsule Take 1 capsule by mouth daily.     Marland Kitchen omeprazole (PRILOSEC) 20 MG capsule Take 20 mg by mouth as needed.     . ondansetron (ZOFRAN) 8 MG tablet Take 1 tablet (8 mg total) by mouth 2 (two) times daily as needed. Start on the third day after chemotherapy. 30 tablet 1  . oxyCODONE (OXY IR/ROXICODONE) 5 MG  immediate release tablet Take 1 tablet (5 mg total) by mouth every 6 (six) hours as needed for severe pain. 30 tablet 0  . prochlorperazine (COMPAZINE) 10 MG tablet Take 1 tablet (10 mg total) by mouth every 6 (six) hours as needed (Nausea or vomiting). 30 tablet 1   No current facility-administered medications for this visit.    PHYSICAL EXAMINATION: ECOG PERFORMANCE STATUS: 1 - Symptomatic but completely ambulatory  Vitals:   12/13/19 0830  BP: 130/77  Pulse: 77  Resp: 17  Temp: 98.2 F (36.8 C)  SpO2: 98%   Filed Weights   12/13/19 0830  Weight: 123 lb (55.8 kg)    GENERAL:alert, no distress and comfortable SKIN: no rash  EYES: sclera clear  NECK: without mass LYMPH:  Not able to palpate left supraclavicular LN LUNGS: clear with normal breathing effort HEART: regular rate & rhythm, no lower extremity edema ABDOMEN: abdomen soft, non-tender and normal bowel sounds Musculoskeletal: no focal osseous tenderness   NEURO: alert & oriented x 3 with fluent speech, no focal motor/sensory deficits PAC without erythema   LABORATORY DATA:  I have reviewed the data as listed CBC Latest Ref Rng & Units 12/13/2019 11/30/2019 11/14/2019  WBC 4.0 - 10.5 K/uL 6.5 6.1 6.2  Hemoglobin 12.0 - 15.0 g/dL 10.0(L) 9.8(L) 9.7(L)  Hematocrit 36.0 - 46.0 % 31.1(L) 30.7(L) 30.6(L)  Platelets 150 - 400 K/uL 233 256 193     CMP Latest Ref Rng & Units 12/13/2019 11/30/2019 11/14/2019  Glucose 70 - 99 mg/dL 101(H) 103(H) 100(H)  BUN 8 - 23 mg/dL 10 11 16   Creatinine 0.44 - 1.00 mg/dL 0.88 0.85 0.97  Sodium 135 - 145 mmol/L 139 139 140  Potassium 3.5 - 5.1 mmol/L 3.9 3.5 3.7  Chloride 98 - 111 mmol/L 100 100 101  CO2 22 - 32 mmol/L 29 28 28   Calcium 8.9 - 10.3 mg/dL 9.2 9.1 9.4  Total Protein 6.5 - 8.1 g/dL 8.2(H) 8.0 8.1  Total Bilirubin 0.3 - 1.2 mg/dL 0.3 0.3 0.2(L)  Alkaline Phos 38 - 126 U/L 81 83 86  AST 15 - 41 U/L 31 31 32  ALT 0 - 44 U/L 10 11 14       RADIOGRAPHIC STUDIES: I  have personally reviewed the radiological images as listed and agreed with the findings in the report. No results found.   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 -Started first line cisplatin and gemcitabine on 12/15/2018 q2 weeks due to advanced age and CHF -She is also onZometa q12monthstarting 01/26/19 -Due to worsening fatigue her chemo was dose reduced by 20% and given q2 weeks  -S/p cycle 13 day 1 on  12/11. She tolerates treatment well overall with intermittent fatigue, otherwise no significant toxicities.  -I reviewed her PET from 12/11/19 which shows significant progression of diffuse nodal metastasis including new left supraclavicular and periaortic LN, increased activity of gallbladder mass, and increased size and metabolic activity of left ischial lesion with extensive extraosseous component. -I discussed the current regimen cisplatin/gemcitabine is no longer controlling her disease and will discontinue.  -given her left hip and leg pain, she is planning to undergo palliative radiation per Dr. Lisbeth Renshaw starting 12/28, expected 3 week course  -we discussed next line treatment options including FOLFOX vs Xeloda. I reviewed likely better efficacy of FOLFOX and potential side effects of both regimens. She is favoring oral chemo but has not decided.  -Her tumor is HER2 amplified, she is a candidate for anti-HER 2 therapy and I discussed this in detail as well.  -Plan to f/u with Dr. Burr Medico in the next 1-2 weeks to finalize her treatment plan to begin after she completes palliative RT to left ischium    2. Left leg pain  -she has acute on chronic left leg/hip pain. Worsened recently with focal tenderness at the left ischial tuberosity.  -saw ortho Dr. Louanne Skye recently  -she was given Rx for oxycodone per ortho -CT on 11/14/19  showed stable metastasis of the left ischium with progressive surrounding soft tissue -PET on 12/22 showed significant increase in size and metabolic activity of the lesion and extraosseous component  -despite progression of the metastatic lesion her pain is stable to slightly improved, she remains functional, ambulatory, and independent  -pending palliative RT to start 12/28 per Dr. Lisbeth Renshaw, expected 3 week course   3. Early CIPN -Developed with cycle 11, mild intermittent tingling to fingertips -no functional difficulties, tuning fork exam is normal -I recommended oral B complex vitamin  -will monitor closely on chemo -no complaints today   4. CAD, S/P CABGX2 on 09/27/2018, EF 40-45% -Currently on Plavix. She also takes lasix just once daily -f/u with Dr. Claiborne Billings -stable  5. Asthma, dyspnea -Currently on Symbicort, Singulair, and albuterol as needed -f/u with pulmonary -baseline cough and DOE are stable  5.Goal of care discussion -The patient understands the goal of care is palliative. -she is full code for now  6. Mild Anemia -Secondary to chemoand her open heart surgery; she did not take oral iron because it causes bloating.  -Hgb 10.0 today, improved   7. Goals of care -She understands her cancer is not curable, treatment goal is palliative   PLAN: -Labs, PET reviewed -Discontinue gem/cisplatin due to disease progression -Proceed with palliative RT to left ischium, to start 12/28 per Dr. Lisbeth Renshaw (3 week course) -Discussed next line treatment options including FOLFOX vs Xeloda, also a candidate for herceptin. She will think about it -F/u with Dr. Burr Medico to finalize treatment plan in 1-2 weeks to start after RT     No problem-specific Assessment & Plan notes found for this encounter.   No orders of the defined types were placed in this encounter.  All questions were answered. The patient knows to call the clinic with any problems, questions or concerns. No barriers  to learning was detected. I spent 20 minutes counseling the patient face to face. The total time spent in the appointment was 25 minutes and more than 50% was on counseling and review of test results     Alla Feeling, NP 12/13/19

## 2019-12-16 DIAGNOSIS — Z510 Encounter for antineoplastic radiation therapy: Secondary | ICD-10-CM | POA: Diagnosis not present

## 2019-12-17 ENCOUNTER — Other Ambulatory Visit: Payer: Self-pay

## 2019-12-17 ENCOUNTER — Ambulatory Visit
Admission: RE | Admit: 2019-12-17 | Discharge: 2019-12-17 | Disposition: A | Discharge status: Home / Self Care | Payer: Medicare Other | Source: Ambulatory Visit | Attending: Radiation Oncology | Admitting: Radiation Oncology

## 2019-12-17 ENCOUNTER — Telehealth: Payer: Self-pay | Admitting: Nurse Practitioner

## 2019-12-17 DIAGNOSIS — Z510 Encounter for antineoplastic radiation therapy: Secondary | ICD-10-CM | POA: Diagnosis not present

## 2019-12-17 NOTE — Telephone Encounter (Signed)
Per 12/24 los, kept appts as scheduled.

## 2019-12-18 ENCOUNTER — Ambulatory Visit
Admission: RE | Admit: 2019-12-18 | Discharge: 2019-12-18 | Disposition: A | Discharge status: Home / Self Care | Payer: Medicare Other | Source: Ambulatory Visit | Attending: Radiation Oncology | Admitting: Radiation Oncology

## 2019-12-18 DIAGNOSIS — Z510 Encounter for antineoplastic radiation therapy: Secondary | ICD-10-CM | POA: Diagnosis not present

## 2019-12-19 ENCOUNTER — Other Ambulatory Visit: Payer: Self-pay

## 2019-12-19 ENCOUNTER — Ambulatory Visit
Admission: RE | Admit: 2019-12-19 | Discharge: 2019-12-19 | Disposition: A | Discharge status: Home / Self Care | Payer: Medicare Other | Source: Ambulatory Visit | Attending: Radiation Oncology | Admitting: Radiation Oncology

## 2019-12-19 DIAGNOSIS — Z510 Encounter for antineoplastic radiation therapy: Secondary | ICD-10-CM | POA: Diagnosis not present

## 2019-12-20 ENCOUNTER — Ambulatory Visit
Admission: RE | Admit: 2019-12-20 | Discharge: 2019-12-20 | Disposition: A | Discharge status: Home / Self Care | Payer: Medicare Other | Source: Ambulatory Visit | Attending: Radiation Oncology | Admitting: Radiation Oncology

## 2019-12-20 ENCOUNTER — Other Ambulatory Visit: Payer: Self-pay

## 2019-12-20 DIAGNOSIS — Z510 Encounter for antineoplastic radiation therapy: Secondary | ICD-10-CM | POA: Diagnosis not present

## 2019-12-24 ENCOUNTER — Ambulatory Visit
Admission: RE | Admit: 2019-12-24 | Discharge: 2019-12-24 | Disposition: A | Discharge status: Home / Self Care | Payer: Medicare Other | Source: Ambulatory Visit | Attending: Radiation Oncology | Admitting: Radiation Oncology

## 2019-12-24 ENCOUNTER — Other Ambulatory Visit: Payer: Self-pay

## 2019-12-24 DIAGNOSIS — Z510 Encounter for antineoplastic radiation therapy: Secondary | ICD-10-CM | POA: Insufficient documentation

## 2019-12-24 DIAGNOSIS — C7951 Secondary malignant neoplasm of bone: Secondary | ICD-10-CM | POA: Insufficient documentation

## 2019-12-24 DIAGNOSIS — C23 Malignant neoplasm of gallbladder: Secondary | ICD-10-CM | POA: Diagnosis not present

## 2019-12-24 NOTE — Progress Notes (Signed)
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 Lymph node, biopsy, mediastinal - LYMPH NODE WITH METASTATIC ADENOCARCINOMA. - SEE MICROSCOPIC DESCRIPTION 2. Plaque, coronary artery - CALCIFIED ATHEROSCLEROTIC PLAQUE.   10/14/2018 Miscellaneous   Foundation One:  MSI stable tumor mutation burden 3Muts/mb ERBB2 amplification CCNE1 amplification TP53 mutation(+) CDK6 amplification  HGF amplification    11/13/2018 Cancer Staging   Staging form: Gallbladder, AJCC 8th Edition - Clinical stage from 11/13/2018: Stage IVB (cTX, cN2, pM1) - Signed by Truitt Merle, MD on 12/15/2018   11/17/2018 Imaging   11/17/2018 CT CAP IMPRESSION: 1. 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 This is associated with extensive upper abdominal and retroperitoneal lymphadenopathy, as well as metastatic lymphadenopathy in the posterior mediastinum and left supraclavicular region. There is also a metastatic lesion to the left ischium. 2. 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 3. Aortic atherosclerosis, in addition to left main and 3 vessel coronary artery disease. Status post median sternotomy for CABG including LIMA to the LAD. 4. There are calcifications of the aortic valve. Echocardiographic correlation for evaluation  of potential valvular dysfunction may be warranted if clinically indicated.    11/21/2018 Initial Diagnosis   Gallbladder cancer ([place])   11/27/2018 Pathology Results   11/27/2018 CA19-9 immunohistochemical stain Per request, a CA19-9 immunohistochemical stain was performed at an outside institution revealing positive staining in the tumor cells.    12/15/2018 - 11/30/2019 Chemotherapy   First line chemo cisplatin and gemcitabine every 2 weeks on 12/15/18. Had 1 month chemo break in April, restarted on 04/20/19. Stopped 11/30/19 due to disease progression.    02/20/2019 PET scan   Restaging PET scan:  IMPRESSION: 1. Positive response to therapy at all primary and metastatic sites. Persistent primary and metastatic lesions do have intense hypermetabolic activity albeit reduced. 2. Decrease in size and hypermetabolic activity of mass lesion in the gallbladder fundus. 3. Interval decrease in size and metabolic activity of periportal adenopathy. 4. Decrease in size and metabolic activity of periaortic lymph nodes. 5. Interval decrease in size and metabolic activity of destructive lesion in the LEFT inferior pubic ramus with new sclerosis. 6. No new or progressive disease.   05/29/2019 Imaging   CT CAP W Contrast  IMPRESSION: Known gallbladder adenocarcinoma, difficult to compare to recent PET, improved from prior CT.  Upper abdominal/retroperitoneal lymphadenopathy, slightly improved from prior PET.  Osseous metastasis involving the left inferior pubic ramus, grossly unchanged.  No evidence of metastatic disease in the chest.  Additional ancillary findings as above.   08/22/2019 Imaging   CT CAP W Contrast  IMPRESSION: 1. Stable soft tissue mass involving the gallbladder fundus. 2. Stable mild porta hepatis and retroperitoneal lymphadenopathy. 3. Stable sclerotic bone metastases. 4. No new or progressive metastatic disease identified. 5. Stable 2.3 cm left thyroid lobe  nodule.   11/14/2019 Imaging   CT Left Hip WO  contrast  IMPRESSION: 1. 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 Associated soft tissue components have progressed, but there is no evidence of pathologic fracture. 2. No acute osseous findings. 3. 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   12/11/2019 PET scan   IMPRESSION: 1. Significant progression of malignancy, with increased activity in the gallbladder mass; considerable increase in size and activity of porta hepatis, retroperitoneal, and upper pelvic adenopathy; significant increase in size and metabolic activity of the left ischial metastatic lesion with extensive extraosseous component; and new mildly hypermetabolic left supraclavicular and thoracic periaortic lymph nodes suspicious for malignant involvement. 2. Faintly increased activity in a left thyroid nodule which was not previously hypermetabolic. Given the relatively low-grade activity, this may be amenable to surveillance, but a significant minority of thyroid nodules with hypermetabolic activity can represent thyroid cancer. 3. Other imaging findings of potential clinical significance: Chronic paranasal sinusitis. Chronic right middle lobe atelectasis. Aortic Atherosclerosis (ICD10-I70.0). Coronary atherosclerosis with cardiomegaly.   12/17/2019 - 01/07/2020 Radiation Therapy   Palliative Radiation with Dr. Lisbeth Renshaw 12/17/19-01/07/20    Chemotherapy   PENDING oral Xeloda       CURRENT THERAPY:  -Palliative Radiation with Dr. Lisbeth Renshaw 12/16/09-01/06/19 -Zometa q12weeks starting 01/26/19 -PENDING oral Xeloda   INTERVAL HISTORY:  Cassie Campbell is here for a follow up and treatment. She presents to the clinic alone. She notes her radiation is leading to improved hip pain. She still ambulates with cane. She does not have walker. She takes 2 tablets of oxycodone a day to control her pain. This does not wake her up at night. She also feel  radiation causes some fatigue. She notes given her risk for COVID she is open to COVID19 vaccine. She is also concerned about having reaction to this vaccine.     REVIEW OF SYSTEMS:   Constitutional: Denies fevers, chills or abnormal weight loss (+) Fatigue  Eyes: Denies blurriness of vision Ears, nose, mouth, throat, and face: Denies mucositis or sore throat Respiratory: Denies cough, dyspnea or wheezes Cardiovascular: Denies palpitation, chest discomfort or lower extremity swelling Gastrointestinal:  Denies nausea, heartburn or change in bowel habits MSK: (+) Left hip pain  Skin: Denies abnormal skin rashes Lymphatics: Denies new lymphadenopathy or easy bruising Neurological:Denies numbness, tingling or new weaknesses Behavioral/Psych: Mood is stable, no new changes  All other systems were reviewed with the patient and are negative.  MEDICAL HISTORY:  Past Medical History:  Diagnosis Date  . Allergic rhinitis   . Arthritis   . Asthma    xolair s 8/05 ?11/07; mastered hfa 12/20/08  . Benign positional vertigo   . CAD (coronary artery disease)    a. 09/2014 NSTEMI s/p LHC with sig 2V dz. dLAD diffusely diseased and not suitable for PCI. unsuccessful RCA PCI d/t heavy calcifications  . gallbladder ca dx'd 10/2018  . GERD (gastroesophageal reflux disease)   . Heart attack ([place])    09/2014  . Hyperlipidemia    <130 ldl pos fm hx, bp  . Hypertension   . Osteopenia    dexa 08/22/07 AP spine + 1.1, left femur -1.3, right femur -.8; dexa 10/06/09 +1.6, left femur =1.6, right femur -.  . PONV (postoperative nausea and vomiting)   . Ruptured disc, cervical   . 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 BREAST SURGERY  10-26-10   Rt. breast bx--for microcalcifications in rt. retroareolar region--dx was hyalinized  fibroadenoma  . BUNIONECTOMY Bilateral   . CARDIAC CATHETERIZATION     2015   . CATARACT EXTRACTION Right 02/02/2018   Dr Satira Sark  . CATARACT EXTRACTION Left 03/30/2018  . [place] SURGERY  12/01  . CORONARY ARTERY BYPASS GRAFT N/A 10/04/2018   Procedure: CORONARY ARTERY BYPASS GRAFTING (CABG) times 2 using left  Internal mammary artery to LAD, left greater saphenous vein - open harvest.;  Surgeon: [name], MD;  Location: [place];  Service: Open Heart Surgery;  Laterality: N/A;  . IR IMAGING GUIDED PORT INSERTION  12/08/2018  . LEFT HEART CATH AND CORONARY ANGIOGRAPHY N/A 09/27/2018   Procedure: LEFT HEART CATH AND CORONARY ANGIOGRAPHY;  Surgeon: Troy Sine, MD;  Location: [place] CV LAB;  Service: Cardiovascular;  Laterality: N/A;  . LEFT HEART CATHETERIZATION WITH CORONARY ANGIOGRAM N/A 10/16/2014   Procedure: LEFT HEART CATHETERIZATION WITH CORONARY ANGIOGRAM;  Surgeon: Blane Ohara, MD;  Location[facility] CATH LAB;  Service: Cardiovascular;  Laterality: N/A;  . Left L4-5 transforaminal lumbar interbody fusion with Depuy cage, rods and screws, local and allograft bone graft, Vivigen; bilateral decompression/partial hemilaminectomy lumbar five-sacral one  07/2017  . PERCUTANEOUS CORONARY ROTOBLATOR INTERVENTION (PCI-R) N/A 10/17/2014   Procedure: PERCUTANEOUS CORONARY ROTOBLATOR INTERVENTION (PCI-R);  Surgeon: Troy Sine, MD;  Location: [facility] CATH LAB;  Service: Cardiovascular;  Laterality: N/A;  . TEE WITHOUT CARDIOVERSION N/A 10/04/2018   Procedure: TRANSESOPHAGEAL ECHOCARDIOGRAM (TEE);  Surgeon: [name], MD;  Location: [place];  Service: Open Heart Surgery;  Laterality: N/A;  . VEIN LIGATION AND STRIPPING Left   . VIDEO BRONCHOSCOPY Bilateral 02/05/2015   Procedure: VIDEO BRONCHOSCOPY WITHOUT FLUORO;  Surgeon: Tanda Rockers, MD;  Location: [facility];  Service: Endoscopy;  Laterality: Bilateral;  . VULVA /PERINEUM BIOPSY  12-30-10   --epidermoid cyst    I have reviewed the social history and family history with the patient and  they are unchanged from previous note.  ALLERGIES:  is allergic to fish-derived products; penicillins; aspirin; brilinta [ticagrelor]; codeine phosphate; and diphenhydramine hcl.  MEDICATIONS:  Current Outpatient Medications  Medication Sig Dispense Refill  . albuterol (PROAIR HFA) 108 (90 Base) MCG/ACT inhaler INHALE 1 TO 2 PUFFS BY MOUTH EVERY 4 HOURS AS NEEDED FOR WHEEZING 8.5 g 3  . atorvastatin (LIPITOR) 80 MG tablet TAKE 1 TABLET(80 MG) BY MOUTH DAILY 90 tablet 1  . bisoprolol (ZEBETA) 10 MG tablet TAKE 1 TABLET(10 MG) BY MOUTH DAILY 90 tablet 1  . budesonide-formoterol (SYMBICORT) 160-4.5 MCG/ACT inhaler INHALE 2 PUFFS BY MOUTH EVERY 12 HOURS 30.6 g 3  . cetirizine (ZYRTEC) 10 MG tablet Take 10 mg by mouth at bedtime as needed for allergies.    . cholecalciferol 2000 units TABS Take 1 tablet (2,000 Units total) by mouth daily. 30 tablet 3  . clopidogrel (PLAVIX) 75 MG tablet TAKE 1 TABLET(75 MG) BY MOUTH DAILY 90 tablet 3  . dexamethasone (DECADRON) 4 MG tablet Take 1 tablet (4 mg total) by mouth 2 (two) times daily with a meal. (Patient taking differently: Take 4 mg by mouth 2 (two) times daily with a meal. Takes prn SOB after chemo) 20 tablet 0  . dextromethorphan-guaiFENesin (MUCINEX DM) 30-600 MG per 12 hr tablet Take 1-2 tablets by mouth every 12 (twelve) hours as needed for cough (with flutter).     . famotidine (PEPCID) 20 MG tablet Take 20 mg by mouth as needed.     . ferrous sulfate 325 (65 FE) MG tablet Take 650  mg by mouth daily with breakfast.     . fluticasone (FLONASE) 50 MCG/ACT nasal spray Place 1-2 sprays into both nostrils 2 (two) times daily as needed for allergies or rhinitis.    . furosemide (LASIX) 20 MG tablet Take 1 tablet (20 mg total) by mouth daily. 180 tablet 3  . gabapentin (NEURONTIN) 100 MG capsule Take 1 capsule (100 mg total) by mouth 3 (three) times daily. Start at '100mg'$  at night first, and gradually increase to '300mg'$  at night, and '100mg'$  am and '100mg'$  pm  over the next month 90 capsule 0  . magic mouthwash SOLN Take 5 mLs by mouth 4 (four) times daily. Leave Benadryl out of compound since pt allergic to it. Use hydrocortisone & nystatin. Swish & swallow or spit for thrush 240 mL 1  . MAGNESIUM-OXIDE 400 (241.3 Mg) MG tablet TAKE 1 TABLET(400 MG) BY MOUTH THREE TIMES DAILY 90 tablet 1  . meclizine (ANTIVERT) 25 MG tablet TAKE 2 TABLETS BY MOUTH THREE TIMES DAILY AS NEEDED 24 tablet 2  . montelukast (SINGULAIR) 10 MG tablet TAKE 1 TABLET(10 MG) BY MOUTH AT BEDTIME 90 tablet 1  . Multiple Vitamin (MULTIVITAMIN) capsule Take 1 capsule by mouth daily.     Marland Kitchen omeprazole (PRILOSEC) 20 MG capsule Take 20 mg by mouth as needed.     Marland Kitchen oxyCODONE (OXY IR/ROXICODONE) 5 MG immediate release tablet Take 1 tablet (5 mg total) by mouth every 8 (eight) hours as needed for severe pain. 45 tablet 0   No current facility-administered medications for this visit.    PHYSICAL EXAMINATION: ECOG PERFORMANCE STATUS: 1 - Symptomatic but completely ambulatory  Vitals:   12/26/19 0925  BP: (!) 146/80  Pulse: 86  Resp: 18  Temp: (!) 97.4 F (36.3 C)  SpO2: 99%   Filed Weights   12/26/19 0925  Weight: 121 lb 14.4 oz (55.3 kg)    Due to COVID19 we will limit examination to appearance. Patient had no complaints.  GENERAL:alert, no distress and comfortable SKIN: skin color normal, no rashes or significant lesions EYES: normal, Conjunctiva are pink and non-injected, sclera clear  NEURO: alert & oriented x 3 with fluent speech   LABORATORY DATA:  I have reviewed the data as listed CBC Latest Ref Rng & Units 12/13/2019 11/30/2019 11/14/2019  WBC 4.0 - 10.5 K/uL 6.5 6.1 6.2  Hemoglobin 12.0 - 15.0 g/dL 10.0(L) 9.8(L) 9.7(L)  Hematocrit 36.0 - 46.0 % 31.1(L) 30.7(L) 30.6(L)  Platelets 150 - 400 K/uL 233 256 193     CMP Latest Ref Rng & Units 12/13/2019 11/30/2019 11/14/2019  Glucose 70 - 99 mg/dL 101(H) 103(H) 100(H)  BUN 8 - 23 mg/dL '10 11 16  '$ Creatinine  0.44 - 1.00 mg/dL 0.88 0.85 0.97  Sodium 135 - 145 mmol/L 139 139 140  Potassium 3.5 - 5.1 mmol/L 3.9 3.5 3.7  Chloride 98 - 111 mmol/L 100 100 101  CO2 22 - 32 mmol/L '29 28 28  '$ Calcium 8.9 - 10.3 mg/dL 9.2 9.1 9.4  Total Protein 6.5 - 8.1 g/dL 8.2(H) 8.0 8.1  Total Bilirubin 0.3 - 1.2 mg/dL 0.3 0.3 0.2(L)  Alkaline Phos 38 - 126 U/L 81 83 86  AST 15 - 41 U/L 31 31 32  ALT 0 - 44 U/L '10 11 14      '$ RADIOGRAPHIC STUDIES: I have personally reviewed the radiological images as listed and agreed with the findings in the report. No results found.   ASSESSMENT & PLAN:  Cassie Campbell  is a 79 y.o. 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 Tolerating well. -She is also onZometa q8monthstarted 01/26/19 -Due to recent significant disease progression based on 11/2019 PET, I recommend switching to second-line therapy. We discussed the option of chemo with FOLFOX q2weeks, single agent Xeloda, and HER2 antibodies Herceptin and Perjeta due to her HER2 amplification in her tumor.  Potential side effects and the treatment schedule were discussed with her in details.  She is reluctant to take intensive chemo, would like to try Xeloda and her 2 antibodies.  Due to her previous extensive cardiac history, low EF last echo cardiogram a year ago, I will repeat to see if she is eligible for HER2 antibodies. I will send her to her cardiology Dr. KClaiborne Billings If EF normal on repeated echo, will offer Xeloda '1500mg'$  bid one week on and one week off, and HP every 3 weeks    --Chemotherapy consent: Side effects including but does not limited to, fatigue, nausea, vomiting, diarrhea, cold sensitivity, hair loss, neuropathy, fluid retention, renal and  kidney dysfunction, neutropenic fever, needed for blood transfusion, bleeding, cardiomyopathy and heart failure, were discussed with patient in great detail.  -she is tolerating RT well. Plan to start in 2 weeks after she completes RT  -F/u in 2 weeks     2.Left upper leg pain -Her 12/20202 PET showed significant increase in size and metabolic activity of the lesion and extraosseous component.  -Despite progression of the metastatic lesion her pain is stable to slightly improved, she remains functional, ambulatory, and independent  -She is currently undergoing Palliative Radiation with Dr. MLisbeth Renshaw12/28/10-1/18/20. Pain is slowly improving, tolerating well with fatigue.  -She was given oxycodone by her Ortho Dr. NLouanne Skye Pain controlled with 2 tabs daily.  -She currently ambulates with cane. I encouraged her to get walker as well.    3. CAD, S/P CABGX2 on 09/27/2018, EF 40-45% -Currently on Plavix. She also takes lasix during chemo. -f/u with Dr. KClaiborne Billings 4. Asthma, dyspnea -Currently on Symbicort, Singulair, and albuterol as needed -f/u with pulmonary, stable, no recent flare -She has recently beenlittle more dyspneic. She can use dexa as needed for worsening wheezing  5.Goal of care discussion -The patient understands the goal of care is palliative. -she is full code for now  6. Mild Anemia  -Secondary to chemoand her recent open heart surgery -She has not taken oral iron because it causes bloating. I encouraged her to take prenatal vitamin. -stable.   Plan -I called in Oral Xeloda '1500mg'$  bid, 7 days on and 7 days off, to start at next visit.  -will submit Hereptin and Perjeta to her insurance to get approval  -continue palliative RT  -ECHO in 1-2 weeks at Dr. KEvette Georgesoffice, I sent a message over  -Lab and F/u and infusion in 2 weeks    No problem-specific Assessment & Plan notes found for this encounter.   No orders of the defined types were placed in this  encounter.  All questions were answered. The patient knows to call the clinic with any problems, questions or concerns. No barriers to learning was detected. The total time spent in the appointment was 45 minutes.    YTruitt Merle MD 12/27/2019   I, AJoslyn Devon am acting as scribe  for Truitt Merle, MD.   I have reviewed the above documentation for accuracy and completeness, and I agree with the above.

## 2019-12-25 ENCOUNTER — Other Ambulatory Visit: Payer: Self-pay

## 2019-12-25 ENCOUNTER — Ambulatory Visit
Admission: RE | Admit: 2019-12-25 | Discharge: 2019-12-25 | Disposition: A | Discharge status: Home / Self Care | Payer: Medicare Other | Source: Ambulatory Visit | Attending: Radiation Oncology | Admitting: Radiation Oncology

## 2019-12-25 DIAGNOSIS — Z510 Encounter for antineoplastic radiation therapy: Secondary | ICD-10-CM | POA: Diagnosis not present

## 2019-12-26 ENCOUNTER — Ambulatory Visit: Payer: Medicare Other

## 2019-12-26 ENCOUNTER — Other Ambulatory Visit: Payer: Medicare Other

## 2019-12-26 ENCOUNTER — Ambulatory Visit
Admission: RE | Admit: 2019-12-26 | Discharge: 2019-12-26 | Disposition: A | Discharge status: Home / Self Care | Payer: Medicare Other | Source: Ambulatory Visit | Attending: Radiation Oncology | Admitting: Radiation Oncology

## 2019-12-26 ENCOUNTER — Inpatient Hospital Stay: Payer: Medicare Other | Attending: Obstetrics | Admitting: Hematology

## 2019-12-26 ENCOUNTER — Other Ambulatory Visit: Payer: Self-pay

## 2019-12-26 VITALS — BP 146/80 | HR 86 | Temp 97.4°F | Resp 18 | Ht 59.0 in | Wt 121.9 lb

## 2019-12-26 DIAGNOSIS — D6481 Anemia due to antineoplastic chemotherapy: Secondary | ICD-10-CM | POA: Insufficient documentation

## 2019-12-26 DIAGNOSIS — Z510 Encounter for antineoplastic radiation therapy: Secondary | ICD-10-CM | POA: Diagnosis not present

## 2019-12-26 DIAGNOSIS — C23 Malignant neoplasm of gallbladder: Secondary | ICD-10-CM

## 2019-12-26 DIAGNOSIS — J45909 Unspecified asthma, uncomplicated: Secondary | ICD-10-CM | POA: Insufficient documentation

## 2019-12-26 DIAGNOSIS — M79652 Pain in left thigh: Secondary | ICD-10-CM | POA: Diagnosis not present

## 2019-12-26 DIAGNOSIS — C7951 Secondary malignant neoplasm of bone: Secondary | ICD-10-CM | POA: Insufficient documentation

## 2019-12-26 DIAGNOSIS — I2510 Atherosclerotic heart disease of native coronary artery without angina pectoris: Secondary | ICD-10-CM | POA: Diagnosis not present

## 2019-12-26 DIAGNOSIS — G629 Polyneuropathy, unspecified: Secondary | ICD-10-CM | POA: Diagnosis not present

## 2019-12-26 DIAGNOSIS — M8580 Other specified disorders of bone density and structure, unspecified site: Secondary | ICD-10-CM | POA: Insufficient documentation

## 2019-12-26 MED ORDER — OXYCODONE HCL 5 MG PO TABS: 5.0000 mg | ORAL_TABLET | Freq: Three times a day (TID) | ORAL | 0 refills | Status: DC | PRN

## 2019-12-27 ENCOUNTER — Other Ambulatory Visit: Payer: Self-pay

## 2019-12-27 ENCOUNTER — Telehealth: Payer: Self-pay | Admitting: Hematology

## 2019-12-27 ENCOUNTER — Encounter: Payer: Self-pay | Admitting: Hematology

## 2019-12-27 ENCOUNTER — Ambulatory Visit
Admission: RE | Admit: 2019-12-27 | Discharge: 2019-12-27 | Disposition: A | Discharge status: Home / Self Care | Payer: Medicare Other | Source: Ambulatory Visit | Attending: Radiation Oncology | Admitting: Radiation Oncology

## 2019-12-27 DIAGNOSIS — Z510 Encounter for antineoplastic radiation therapy: Secondary | ICD-10-CM | POA: Diagnosis not present

## 2019-12-27 MED ORDER — CAPECITABINE 500 MG PO TABS: 1000.0000 mg/m2 | ORAL_TABLET | Freq: Two times a day (BID) | ORAL | 1 refills | Status: DC

## 2019-12-27 NOTE — Telephone Encounter (Signed)
No los per 1/6. 

## 2019-12-27 NOTE — Progress Notes (Signed)
DISCONTINUE OFF PATHWAY REGIMEN - [Other Dx]   [id number]:Cisplatin 25 mg/m2 D1,8 + Gemcitabine 1,000 mg/m2 D1,8 q21 Days:   A cycle is every 21 days:     Gemcitabine      Cisplatin   **Always confirm dose/schedule in your pharmacy ordering system**  REASON: Disease Progression PRIOR TREATMENT: Cisplatin 25 mg/m2 D1,8 + Gemcitabine 1,000 mg/m2 D1,8 q21 Days TREATMENT RESPONSE: Complete Response (CR)  START OFF PATHWAY REGIMEN - Other   [id number]:Trastuzumab and hyaluronidase-oysk 600 mg/10,000 units SUBQ D1 q21 Days:   A cycle is every 21 days:     Trastuzumab and hyaluronidase-oysk   **Always confirm dose/schedule in your pharmacy ordering system**  Patient Characteristics: Intent of Therapy: Non-Curative / Palliative Intent, Discussed with Patient

## 2019-12-28 ENCOUNTER — Telehealth: Payer: Self-pay

## 2019-12-28 ENCOUNTER — Telehealth: Payer: Self-pay | Admitting: Pharmacist

## 2019-12-28 ENCOUNTER — Ambulatory Visit: Payer: Medicare Other

## 2019-12-28 NOTE — Telephone Encounter (Signed)
Oral Oncology Patient Advocate Encounter  After completing a benefits investigation, prior authorization for Xeloda  is not required at this time through [place].  Patient's copay is $26.69  [place] [facility] Phone [phone] Fax [phone] 12/28/2019 11:05 AM

## 2019-12-28 NOTE — Telephone Encounter (Signed)
Oral Oncology Pharmacist Encounter  Received new prescription for Xeloda (capecitabine) for the treatment of metastatic gallbladder cancer in conjunction with Herceptin and Perjecta, planned duration until disease progression or unacceptable drug toxicity. Planned start in ~[redacted] weeks along with infusion.  CMP from 12/12/2020 assessed, no relevant lab abnormalities. Prescription dose and frequency assessed.   Current medication list in Epic reviewed, one DDIs with capecitabine identified: - Omeprazole: Proton Pump Inhibitors (PPI) may diminish the therapeutic effect of capecitabine. Recommend evaluating the need for a PPI/acid suppression. If acid suppression is needed, recommend switching to a H2 antagonist (eg, famotidine) to avoid this DDI.   Prescription has been e-scribed to the [facility] for benefits analysis and approval.  Oral Oncology Clinic will continue to follow for insurance authorization, copayment issues, initial counseling and start date.  Darl Pikes, PharmD, BCPS, [facility] Hematology/Oncology Clinical Pharmacist [facility] Oral [place] [facility] [phone]  12/28/2019 11:33 AM

## 2019-12-31 ENCOUNTER — Other Ambulatory Visit: Payer: Self-pay

## 2019-12-31 ENCOUNTER — Ambulatory Visit
Admission: RE | Admit: 2019-12-31 | Discharge: 2019-12-31 | Disposition: A | Discharge status: Home / Self Care | Payer: Medicare Other | Source: Ambulatory Visit | Attending: Radiation Oncology | Admitting: Radiation Oncology

## 2019-12-31 ENCOUNTER — Telehealth: Payer: Self-pay | Admitting: Internal Medicine

## 2019-12-31 DIAGNOSIS — Z510 Encounter for antineoplastic radiation therapy: Secondary | ICD-10-CM | POA: Diagnosis not present

## 2019-12-31 NOTE — Telephone Encounter (Signed)
Dr. Melvyn Novas,  The patient called back. She wanted to know if based on her medical history, especially her allergies, if she is a candidate for the covid vaccine. I did let her know that you were not in the office today.  Patient gave permission to leave a message on her voicemail if the call is returned in the morning. Otherwise, the call needs to be returned in the afternoon since she has a scheduled oncology appointments at 10:30 every morning this week.

## 2019-12-31 NOTE — Telephone Encounter (Signed)
lmtcb for pt.  

## 2019-12-31 NOTE — Telephone Encounter (Signed)
Called and spoke with pt letting her know the info stated by MW. Pt verbalized understanding. Nothing further needed. 

## 2019-12-31 NOTE — Telephone Encounter (Signed)
Pt is returning call CB# [phone]

## 2019-12-31 NOTE — Telephone Encounter (Signed)
There will be increased risk but worth taking  Will need to stay for 30 min p shot where whoever gave her the shot can observe her.   Could also call Dr Neldon Mc for his opinion if still concerned

## 2020-01-01 ENCOUNTER — Ambulatory Visit
Admission: RE | Admit: 2020-01-01 | Discharge: 2020-01-01 | Disposition: A | Discharge status: Home / Self Care | Payer: Medicare Other | Source: Ambulatory Visit | Attending: Radiation Oncology | Admitting: Radiation Oncology

## 2020-01-01 ENCOUNTER — Other Ambulatory Visit: Payer: Self-pay

## 2020-01-01 DIAGNOSIS — Z510 Encounter for antineoplastic radiation therapy: Secondary | ICD-10-CM | POA: Diagnosis not present

## 2020-01-01 NOTE — Telephone Encounter (Signed)
Oral Chemotherapy Pharmacist Encounter  Patient plans on picking up her Xeloda from [place] following her appt tomorrow 01/02/20. She knows the plan is to start the Xeloda after her RT is finished. She knows to wait for MD/NP to tell her to get started.  Patient Education I spoke with patient for overview of new oral chemotherapy medication: Xeloda (capecitabine) for the treatment of metastatic gallbladder cancer in conjunction with Herceptin and Perjecta, planned duration until disease progression or unacceptable drug toxicity. Will likely start on 1/20 along with the start of her infusion treatment.  Pt is doing well. Counseled patient on administration, dosing, side effects, monitoring, drug-food interactions, safe handling, storage, and disposal. Patient will take 3 tablets (1,500 mg total) by mouth 2 (two) times daily after a meal. 7 days on and 7 days off.  Side effects include but not limited to: diarrhea, hand-foot syndrome, N/V, fatigue, decreased wbc.    Patient reports only taking omeprazole occasionally, she will sometimes take famotidine instead. Reviewed DDI with her (detailed in previous note). She is okay with stopping the omeprazole and taking famotidine if she needs something for acid reflux control.  Reviewed with patient importance of keeping a medication schedule and plan for any missed doses.  Ms. Offenbacker voiced understanding and appreciation. All questions answered. Benjamine Mola will provide the patient with a medication handout, calendar, and map to [place] when she checks in for her appt tomorrow morning 12/1319.  Provided patient with [facility] [place] [facility] phone number. Patient knows to call the office with questions or concerns. Oral Chemotherapy Navigation Clinic will continue to follow.  Darl Pikes, PharmD, BCPS, [facility] Hematology/Oncology Clinical Pharmacist [facility] [place] [facility] [phone]  01/01/2020 3:34 PM

## 2020-01-02 ENCOUNTER — Other Ambulatory Visit: Payer: Self-pay | Admitting: *Deleted

## 2020-01-02 ENCOUNTER — Other Ambulatory Visit: Payer: Self-pay

## 2020-01-02 ENCOUNTER — Ambulatory Visit
Admission: RE | Admit: 2020-01-02 | Discharge: 2020-01-02 | Disposition: A | Discharge status: Home / Self Care | Payer: Medicare Other | Source: Ambulatory Visit | Attending: Radiation Oncology | Admitting: Radiation Oncology

## 2020-01-02 DIAGNOSIS — Z510 Encounter for antineoplastic radiation therapy: Secondary | ICD-10-CM | POA: Diagnosis not present

## 2020-01-02 DIAGNOSIS — I214 Non-ST elevation (NSTEMI) myocardial infarction: Secondary | ICD-10-CM

## 2020-01-02 DIAGNOSIS — I428 Other cardiomyopathies: Secondary | ICD-10-CM

## 2020-01-02 MED FILL — CAPECITABINE 500 MG TABS: 500 | 28 days supply | Qty: 84 | Fill #0

## 2020-01-02 NOTE — Progress Notes (Signed)
Echo ordered, pt have appt with Dr Claiborne Billings 01/15 @ 10:00 am

## 2020-01-03 ENCOUNTER — Other Ambulatory Visit: Payer: Self-pay

## 2020-01-03 ENCOUNTER — Ambulatory Visit
Admission: RE | Admit: 2020-01-03 | Discharge: 2020-01-03 | Disposition: A | Discharge status: Home / Self Care | Payer: Medicare Other | Source: Ambulatory Visit | Attending: Radiation Oncology | Admitting: Radiation Oncology

## 2020-01-03 ENCOUNTER — Telehealth: Payer: Self-pay | Admitting: *Deleted

## 2020-01-03 DIAGNOSIS — Z510 Encounter for antineoplastic radiation therapy: Secondary | ICD-10-CM | POA: Diagnosis not present

## 2020-01-03 NOTE — Telephone Encounter (Signed)
Dr Claiborne Billings received call from Dr Burr Medico office, wanted pt to be seen and get an Echo before her procedure schedule for 01/20, Call pt and leave detailed message on pt answering service to make her aware of her appointment with Dr Claiborne Billings tomorrow @ 10:00 am.

## 2020-01-04 ENCOUNTER — Other Ambulatory Visit: Payer: Self-pay

## 2020-01-04 ENCOUNTER — Encounter (INDEPENDENT_AMBULATORY_CARE_PROVIDER_SITE_OTHER): Payer: Self-pay

## 2020-01-04 ENCOUNTER — Encounter: Payer: Self-pay | Admitting: Cardiovascular Disease

## 2020-01-04 ENCOUNTER — Telehealth: Payer: Self-pay | Admitting: Cardiovascular Disease

## 2020-01-04 ENCOUNTER — Ambulatory Visit: Payer: Medicare Other | Admitting: Cardiovascular Disease

## 2020-01-04 ENCOUNTER — Ambulatory Visit
Admission: RE | Admit: 2020-01-04 | Discharge: 2020-01-04 | Disposition: A | Discharge status: Home / Self Care | Payer: Medicare Other | Source: Ambulatory Visit | Attending: Radiation Oncology | Admitting: Radiation Oncology

## 2020-01-04 VITALS — BP 116/60 | HR 66 | Ht 59.0 in | Wt 122.0 lb

## 2020-01-04 DIAGNOSIS — C799 Secondary malignant neoplasm of unspecified site: Secondary | ICD-10-CM

## 2020-01-04 DIAGNOSIS — I2510 Atherosclerotic heart disease of native coronary artery without angina pectoris: Secondary | ICD-10-CM | POA: Diagnosis not present

## 2020-01-04 DIAGNOSIS — E785 Hyperlipidemia, unspecified: Secondary | ICD-10-CM

## 2020-01-04 DIAGNOSIS — Z951 Presence of aortocoronary bypass graft: Secondary | ICD-10-CM | POA: Diagnosis not present

## 2020-01-04 DIAGNOSIS — Z510 Encounter for antineoplastic radiation therapy: Secondary | ICD-10-CM | POA: Diagnosis not present

## 2020-01-04 DIAGNOSIS — I10 Essential (primary) hypertension: Secondary | ICD-10-CM

## 2020-01-04 DIAGNOSIS — J4520 Mild intermittent asthma, uncomplicated: Secondary | ICD-10-CM

## 2020-01-04 DIAGNOSIS — I255 Ischemic cardiomyopathy: Secondary | ICD-10-CM | POA: Diagnosis not present

## 2020-01-04 NOTE — Patient Instructions (Signed)
Medication Instructions:  Continue current medications  *If you need a refill on your cardiac medications before your next appointment, please call your pharmacy*  Lab Work: None Ordered  Testing/Procedures: Your physician has requested that you have an echocardiogram on Monday January 15th @ 7:15 am. Echocardiography is a painless test that uses sound waves to create images of your heart. It provides your doctor with information about the size and shape of your heart and how well your heart's chambers and valves are working. This procedure takes approximately one hour. There are no restrictions for this procedure.  Follow-Up: At [facility], you and your health needs are our priority.  As part of our continuing mission to provide you with exceptional heart care, we have created designated Provider Care Teams.  These Care Teams include your primary Cardiologist (physician) and Advanced Practice Providers (APPs -  Physician Assistants and Nurse Practitioners) who all work together to provide you with the care you need, when you need it.  Your next appointment:   6 month(s)  The format for your next appointment:   In Person  Provider:   Shelva Majestic, MD

## 2020-01-04 NOTE — Telephone Encounter (Signed)
New Message   Patient was in earlier today and she wanted to discuss about changing from atorvastatin to something else that can stop her leg from aching. The name of the medication is omeprazole is the one that she will be discontinuing as well. Please call to discuss.

## 2020-01-04 NOTE — Telephone Encounter (Signed)
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

## 2020-01-04 NOTE — Progress Notes (Signed)
Patient ID: Cassie Campbell, female   DOB: 1941-07-24, 79 y.o.   MRN: [id number]     HPI: Cassie Campbell is a 79 y.o. female who presents for a 2 month follow-up cardiology evaluation higher to initiating chemotherapy.  Cassie Campbell has a history of complex CAD and underwent catheterization by Dr. Fletcher Anon with class IV symptoms on 10/16/2014.  Cassie Campbell was found to have diffusely diseased LAD, which was not suitable for revascularization.  Cassie Campbell had ruled in for non-ST segment elevation myocardial infarction, felt to be due to a severely calcified almost subtotal RCA with severe diffuse calcified disease.  Initial attempt at PCI by Dr. Fletcher Anon was unsuccessful due to the severe calcification.  Cassie Campbell was brought back to the laboratory the following day, and I performed a very long attempt at high-speed rotational atherectomy.  Unfortunately, due to the severely stenosed calcified lesion above the acute margin the Roto floppy wire was never be advanced distal enough due to severe disease beyond this site to allow the burr to be inserted to reach the stenosis.  Multiple attempts were made to utilize wire transfer, but even the smallest catheter was never able to pass the stenosis to allow for the Roto floppy wire to be reinserted in exchange for a Fielder XT wire, which was able to cross the stenosis and advanced distally.  Consequently, the procedure was aborted.  Cassie Campbell was sent home on increased medication regimen with potential plans for possible follow-up evaluation depending upon symptom status.  Since hospital discharge, Cassie Campbell has not experienced any significant recurrent anginal chest pain.  During the hospitalization ranolazine was added initially at 500 mg twice a day to isosorbide mononitrate, amlodipine, and beta blocker therapy.  Cassie Campbell has been treated with aspirin and Brilinta for dual antiplatelet therapy. When I saw her for subsequent evaluation I further titrated her ranolazine to 1000 mg twice a day and further  titrated her Lopressor to 37.5 mg twice a day.  Her creatinine remained normal following her contrast load and Cassie Campbell was mildly anemic.    Cassie Campbell has a history of asthma and is on Symbicort 160-4.5 and Singulair.  Cassie Campbell  has a history of hyperlipidemia, currently on Lipitor 80 mg.  Cassie Campbell does have GERD for which he takes Pepcid as well as omeprazole.  Cassie Campbell developed some increased wheezing on Lopressor and was changed to bisoprolol 5 mg in place of metoprolol.  Cassie Campbell has seen Dr. Melvyn Novas for her severe chronic asthma and in the past.  Cassie Campbell also was found to have marked atopy with significant elevation of IgG E levels.  Cassie Campbell feels that her breathing has improved with the changed to bisoprolol from Lopressor.   In March 2016 Cassie Campbell had been remaining stable but  had experienced a 10-15 minute episode of chest pain during the evening which responded to nitroglycerin. When I saw her the following day Cassie Campbell had been taking isosorbide 90 mg in the morning and I added isosorbide 30 mg at bedtime to her medical regimen of Ranexa 1000 mg twice a day , amlodipine 10 mg daily, and bisoprolol 5 mg. Cassie Campbell denies recurrent chest pain symptomatology since the nocturnal dose of Imdur was added.  Cassie Campbell developed a rash and  was advised to stop both Brilinta and Ranexa.  Cassie Campbell was started on Plavix. Her rash ultimately resolved.    Her husband passed away in on 2016-01-03.  Cassie Campbell has been more active.  Cassie Campbell denies nocturnal symptoms.  Cassie Campbell was evaluated in the emergency room in  January 2017 with right shoulder discomfort.    Cassie Campbell underwent low back surgery in August 2018 by Dr. Louanne Skye. Cassie Campbell tolerated surgery well.  A preoperative nuclear study showed an EF of 70% with probable apical soft tissue attenuation.  There was no ischemia.  Last saw her in November 2018 and Cassie Campbell was doing well without chest pain or shortness of breath.  Although Cassie Campbell has a history of mild asthma.   Cassie Campbell was diagnosed with GERD.  Cassie Campbell underwent successful cataract surgery right eye in  February and left eye in April 2019 by Dr. Satira Sark.  Since I saw her in June 2019 Cassie Campbell was seen by Kerin Ransom on September 26, 2018 with a history suggestive of recurrent unstable angina.  Cassie Campbell was hospitalized and on September 27, 2018 repeat cardiac catheterization by me was performed.  This revealed severe coronary calcification involving the LAD circumflex and RCA.  There was smooth 20 to 25% distal left main narrowing.  The LAD had 40% proximal calcified stenosis.  The LAD was severely calcified with 95% stenosis in the region of the first diagonal and there was diffuse disease in the midsegment of 50% followed by 90% mid stenosis and 80% distal stenosis.  The circumflex was calcified without high-grade obstructive disease.  The RCA was calcified and was totally occluded just prior to the acute margin.  There was extensive left-to-right collateralization to the distal RCA via the left circulation.  There was low normal LV function with an EF of 50 to 55% with a small region of focal mid mild anterolateral and posterior basal inferior hypocontractility.  Due to the severe calcification CABG revascularization surgery was recommended.  Cassie Campbell underwent CABG surgery x2 with her LIMA to her LAD and SVG to the PDA.  However, during surgery Cassie Campbell was instantly found to have a mediastinal lymph node which was sent for pathology and unfortunately showed metastatic adenocarcinoma.  Cassie Campbell is now being followed by Dr. 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  PET and CT imaging of her chest abdomen and pelvis with contrast showed diffuse adenopathy from supraclavicular to the abdomen with a hypermetabolic mass in the gallbladder and large hypermetabolic bony lesion in the posterior left pelvis.    I saw her on December 06, 2018 in follow-up of her CABG revascularization and oncologic assessments.  Cassie Campbell underwent successful Port-A-Cath placement on December 20 and Plavix was held for this  procedure.  I recommended that Cassie Campbell undergo an echo Doppler study prior to initiating chemotherapy.  Her echo Doppler study was done on December 11, 2018 and showed an EF of 40 to 45% status post revascularization with residual akinesis of the mid apical anteroseptal, inferoseptal and apical wall.  Cassie Campbell had grade 1 diastolic dysfunction.  Tissue Doppler was consistent with high ventricular filling pressures.  Cassie Campbell had mildly increased PA pressure 38 mm and abnormal global longitudinal strain pattern.  On December 15, 2018 Cassie Campbell initiated her first course of chemotherapy.  Cassie Campbell had some mild nausea but otherwise felt well.  Cassie Campbell is feeling better.  Cassie Campbell is scheduled to have a follow-up oncology evaluation later this week.  From a cardiac perspective, Cassie Campbell denies any chest pain or shortness of breath.  Cassie Campbell is now able to walk from room to room without her previous dyspnea or chest discomfort.  Cassie Campbell feels improved.    I saw her in a telemedicine evaluation in April 2020.  Since her December evaluation Cassie Campbell had been undergoing successful  chemotherapy every 2 weeks with cis-platinum and gemcitabine.  Cassie Campbell has completed 6 cycles and is currently taking a month off with plans for reinstitution on May 1.  Her most recent PET scan was significantly encouraging and showed significant early benefit with reduction in tumor burden.   I last saw her in November 2020 at which time Cassie Campbell denied any recurrent chest pain.  Cassie Campbell was admitting to fatigue following her chemotherapy treatments.    Cassie Campbell has recently undergone reevaluation by Dr. Burr Medico her oncologist and her most recent PET scan from December 2020 showed significant progression of malignancy with increased activity and her gallbladder mass, increase in size and activity of the porta hepatis, retroperitoneal, and upper pelvic adenopathy.  There was also significant increase in size and metabolic activity of the left ischial metastatic lesion with extensive extraosseous component and  new mildly hypermetabolic left subclavicular and thoracic periaortic lymph nodes.  As result, Cassie Campbell is scheduled to initiate new therapy next week with Herceptin.  Dr. Burr Medico has requested I see the patient prior to initiation of new therapy and obtain an echo Doppler study.  Cassie Campbell was added onto my schedule and presents today for evaluation.   Past Medical History:  Diagnosis Date  . Allergic rhinitis   . Arthritis   . Asthma    xolair s 8/05 ?11/07; mastered hfa 12/20/08  . Benign positional vertigo   . CAD (coronary artery disease)    a. 09/2014 NSTEMI s/p LHC with sig 2V dz. dLAD diffusely diseased and not suitable for PCI. unsuccessful RCA PCI d/t heavy calcifications  . gallbladder ca dx'd 10/2018  . GERD (gastroesophageal reflux disease)   . Heart attack ([place])    09/2014  . Hyperlipidemia    <130 ldl pos fm hx, bp  . Hypertension   . Osteopenia    dexa 08/22/07 AP spine + 1.1, left femur -1.3, right femur -.8; dexa 10/06/09 +1.6, left femur =1.6, right femur -.  . PONV (postoperative nausea and vomiting)   . Ruptured disc, cervical   . 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 BREAST SURGERY  10-26-10   Rt. breast bx--for microcalcifications in rt. retroareolar region--dx was hyalinized fibroadenoma  . BUNIONECTOMY Bilateral   . CARDIAC CATHETERIZATION     2015  . CATARACT EXTRACTION Right 02/02/2018   Dr Satira Sark  . CATARACT EXTRACTION Left 03/30/2018  . [place] [facility]GERY  12/01  . CORONARY ARTERY BYPASS GRAFT N/A 10/04/2018   Procedure: CORONARY ARTERY BYPASS GRAFTING (CABG) times 2 using left  Internal mammary artery to LAD, left greater saphenous vein - open harvest.;  Surgeon: [name], MD;  Location: [facility];  Service: Open Heart Surgery;  Laterality: N/A;  . IR IMAGING GUIDED PORT INSERTION  12/08/2018  . LEFT HEART CATH AND CORONARY ANGIOGRAPHY N/A 09/27/2018   Procedure: LEFT HEART  CATH AND CORONARY ANGIOGRAPHY;  Surgeon: Troy Sine, MD;  Location: [facility];  Service: Cardiovascular;  Laterality: N/A;  . LEFT HEART CATHETERIZATION WITH CORONARY ANGIOGRAM N/A 10/16/2014   Procedure: LEFT HEART CATHETERIZATION WITH CORONARY ANGIOGRAM;  Surgeon: Blane Ohara, MD;  Location: [facility] CATH LAB;  Service: Cardiovascular;  Laterality: N/A;  . Left L4-5 transforaminal lumbar interbody fusion with Depuy cage, rods and screws, local and allograft bone graft, Vivigen; bilateral decompression/partial hemilaminectomy lumbar five-sacral one  07/2017  . PERCUTANEOUS CORONARY ROTOBLATOR INTERVENTION (PCI-R) N/A 10/17/2014  Procedure: PERCUTANEOUS CORONARY ROTOBLATOR INTERVENTION (PCI-R);  Surgeon: Troy Sine, MD;  Location: [facility] CATH LAB;  Service: Cardiovascular;  Laterality: N/A;  . TEE WITHOUT CARDIOVERSION N/A 10/04/2018   Procedure: TRANSESOPHAGEAL ECHOCARDIOGRAM (TEE);  Surgeon: [name], MD;  Location: [facility];  Service: Open Heart Surgery;  Laterality: N/A;  . VEIN LIGATION AND STRIPPING Left   . VIDEO BRONCHOSCOPY Bilateral 02/05/2015   Procedure: VIDEO BRONCHOSCOPY WITHOUT FLUORO;  Surgeon: Tanda Rockers, MD;  Location: [facility];  Service: Endoscopy;  Laterality: Bilateral;  . VULVA /PERINEUM BIOPSY  12-30-10   --epidermoid cyst    Allergies  Allergen Reactions  . Fish-Derived Products Shortness Of Breath, Swelling and Rash  . Penicillins Shortness Of Breath, Swelling and Rash    PATIENT HAS HAD A PCN REACTION WITH IMMEDIATE RASH, FACIAL/TONGUE/THROAT SWELLING, SOB, OR LIGHTHEADEDNESS WITH HYPOTENSION:  #  #  #  YES  #  #  #   Has patient had a PCN reaction causing severe rash involving mucus membranes or skin necrosis: No PATIENT HAS HAD A PCN REACTION THAT REQUIRED HOSPITALIZATION:  #  #  #  YES  #  #  #   Has patient had a PCN reaction occurring within the last 10 years: No    . Aspirin Swelling and Rash    SWELLING REACTION UNSPECIFIED   .  Brilinta [Ticagrelor] Rash    Started Brilinta 09/2014 - rash - unsure which it is directly related to  . Codeine Phosphate Nausea Only  . Diphenhydramine Hcl Rash    Current Outpatient Medications  Medication Sig Dispense Refill  . albuterol (PROAIR HFA) 108 (90 Base) MCG/ACT inhaler INHALE 1 TO 2 PUFFS BY MOUTH EVERY 4 HOURS AS NEEDED FOR WHEEZING 8.5 g 3  . bisoprolol (ZEBETA) 10 MG tablet TAKE 1 TABLET(10 MG) BY MOUTH DAILY 90 tablet 1  . budesonide-formoterol (SYMBICORT) 160-4.5 MCG/ACT inhaler INHALE 2 PUFFS BY MOUTH EVERY 12 HOURS 30.6 g 3  . capecitabine (XELODA) 500 MG tablet Take 3 tablets (1,500 mg total) by mouth 2 (two) times daily after a meal. 7 days on and 7 days off 84 tablet 1  . cetirizine (ZYRTEC) 10 MG tablet Take 10 mg by mouth at bedtime as needed for allergies.    . cholecalciferol 2000 units TABS Take 1 tablet (2,000 Units total) by mouth daily. 30 tablet 3  . clopidogrel (PLAVIX) 75 MG tablet TAKE 1 TABLET(75 MG) BY MOUTH DAILY 90 tablet 3  . dexamethasone (DECADRON) 4 MG tablet Take 1 tablet (4 mg total) by mouth 2 (two) times daily with a meal. (Patient taking differently: Take 4 mg by mouth 2 (two) times daily with a meal. Takes prn SOB after chemo) 20 tablet 0  . dextromethorphan-guaiFENesin (MUCINEX DM) 30-600 MG per 12 hr tablet Take 1-2 tablets by mouth every 12 (twelve) hours as needed for cough (with flutter).     . famotidine (PEPCID) 20 MG tablet Take 20 mg by mouth as needed.     . ferrous sulfate 325 (65 FE) MG tablet Take 650 mg by mouth daily with breakfast.     . fluticasone (FLONASE) 50 MCG/ACT nasal spray Place 1-2 sprays into both nostrils 2 (two) times daily as needed for allergies or rhinitis.    . furosemide (LASIX) 20 MG tablet Take 1 tablet (20 mg total) by mouth daily. 180 tablet 3  . gabapentin (NEURONTIN) 100 MG capsule Take 1 capsule (100 mg total) by mouth 3 (three) times daily.  Start at 143m at night first, and gradually increase to 3029m at night, and 10064mm and 100m63m over the next month 90 capsule 0  . magic mouthwash SOLN Take 5 mLs by mouth 4 (four) times daily. Leave Benadryl out of compound since pt allergic to it. Use hydrocortisone & nystatin. Swish & swallow or spit for thrush 240 mL 1  . MAGNESIUM-OXIDE 400 (241.3 Mg) MG tablet TAKE 1 TABLET(400 MG) BY MOUTH THREE TIMES DAILY 90 tablet 1  . meclizine (ANTIVERT) 25 MG tablet TAKE 2 TABLETS BY MOUTH THREE TIMES DAILY AS NEEDED 24 tablet 2  . montelukast (SINGULAIR) 10 MG tablet TAKE 1 TABLET(10 MG) BY MOUTH AT BEDTIME 90 tablet 1  . Multiple Vitamin (MULTIVITAMIN) capsule Take 1 capsule by mouth daily.     . oxMarland KitchenCODONE (OXY IR/ROXICODONE) 5 MG immediate release tablet Take 1 tablet (5 mg total) by mouth every 8 (eight) hours as needed for severe pain. 45 tablet 0   No current facility-administered medications for this visit.    Social History   Socioeconomic History  . Marital status: Widowed    Spouse name: Not on file  . Number of children: Not on file  . Years of education: Not on file  . Highest education level: Not on file  Occupational History  . Occupation: retired purcTour managerbacco Use  . Smoking status: Never Smoker  . Smokeless tobacco: Never Used  Substance and Sexual Activity  . Alcohol use: No    Alcohol/week: 0.0 standard drinks  . Drug use: No  . Sexual activity: Not Currently    Partners: Male    Birth control/protection: Post-menopausal  Other Topics Concern  . Not on file  Social History Narrative  . Not on file   Social Determinants of Health   Financial Resource Strain:   . Difficulty of Paying Living Expenses: Not on file  Food Insecurity:   . Worried About RunnCharity fundraiserthe Last Year: Not on file  . Ran Out of Food in the Last Year: Not on file  Transportation Needs:   . Lack of Transportation (Medical): Not on file  . Lack of Transportation (Non-Medical): Not on file  Physical Activity:   . Days  of Exercise per Week: Not on file  . Minutes of Exercise per Session: Not on file  Stress:   . Feeling of Stress : Not on file  Social Connections:   . Frequency of Communication with Friends and Family: Not on file  . Frequency of Social Gatherings with Friends and Family: Not on file  . Attends Religious Services: Not on file  . Active Member of Clubs or Organizations: Not on file  . Attends ClubArchivisttings: Not on file  . Marital Status: Not on file  Intimate Partner Violence:   . Fear of Current or Ex-Partner: Not on file  . Emotionally Abused: Not on file  . Physically Abused: Not on file  . Sexually Abused: Not on file    Family History  Problem Relation Age of Onset  . Diabetes Mother        AODM  . Stroke Mother   . Hypertension Mother   . Heart disease Father   . Diabetes Sister        AODM  . Hypertension Sister   . 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 Other comprehensive 14 point system review is  negative   Physical Exam BP 116/60   Pulse 66   Ht _0  (1.499 m)   Wt 122 lb (55.3 kg)   LMP 12/21/1999   SpO2 96%   BMI 24.64 kg/m    Repeat blood pressure by me was 130/68  Wt Readings from Last 3 Encounters:  01/04/20 122 lb (55.3 kg)  12/26/19 121 lb 14.4 oz (55.3 kg)  12/13/19 123 lb (55.8 kg)   General: Alert, oriented, no distress.  Skin: normal turgor, no rashes, warm and dry HEENT: Normocephalic, atraumatic. Pupils equal round and reactive to light; sclera anicteric; extraocular muscles intact;  Nose without nasal septal hypertrophy Mouth/Parynx benign; Mallinpatti scale 2 Neck: No JVD, no carotid bruits; normal carotid upstroke Lungs: clear to ausculatation and percussion; no wheezing or rales Chest wall: without tenderness to palpitation Heart: PMI not displaced, RRR, s1 s2 normal, 1/6 systolic murmur, no diastolic murmur, no rubs, gallops, thrills, or heaves Abdomen: soft, nontender; no hepatosplenomehaly, BS+; abdominal aorta nontender and not dilated by palpation. Back: no CVA tenderness Pulses 2+ Musculoskeletal: full range of motion, normal strength, no joint deformities Extremities: no clubbing cyanosis or edema, Homan's sign negative  Neurologic: grossly nonfocal; Cranial nerves grossly wnl Psychologic: Normal mood and affect   ECG (independently read by me): Normal sinus rhythm at 65 bpm.  QS complex V1 V2 with T wave abnormality V1 through V5.  Normal intervals.  No ectopy  November 2020 ECG (independently read by me): Sinus rhythm at 89 bpm.  Nonspecific T wave abnormality.  Normal intervals.  No ectopy  January 2020 ECG (independently read by me): Normal sinus rhythm at 78 bpm.  Biatrial enlargement.  QS complex V1 through V3.  Anterolateral T wave abnormality.  December 2019 ECG (independently read by me): Sinus rhythm at 76 bpm.  QS complex anteroseptally.  Lateral T wave abnormality.    June 2019 ECG (independently read by me): Normal sinus  rhythm 81 bpm.  Right bundle branch block with repolarization changes.  November 16, 2017 ECG (independently read by me): Normal sinus rhythm at 75 bpm with right bundle branch block.  Left axis deviation.  Q waves in 3 and aVF.  April 2018 ECG (independently read by me): Normal sinus rhythm at 74 bpm.  Right bundle-branch block with repolarization changes.  October 2017 ECG (independently read by me): Normal sinus rhythm at 68 bpm.  The right bundle branch block with repolarization changes.  April 2017 ECG (independently read by me): Normal sinus rhythm at 70 bpm.  Right bundle-branch block with repolarization changes.  November 2016 ECG (independently read by me): Normal sinus rhythm at 70 bpm.  Right bundle-branch block with repolarization changes.  May 2016 ECG (independently read by me):  Normal sinus rhythm at 65 bpm.  Right bundle branch block with repolarization changes.  March 2016 ECG (independently read by me): Normal sinus rhythm at 68 bpm.  No ectopy.  No signal been ST segment changes.  December 2015 ECG (independently read by me): Normal sinus rhythm at 77 bpm.  Normal intervals.  No significant ST changes.  November 2015 ECG (independently read by me):sinus rhythm at 88 bpm.  QTc interval 454 ms.  No significant ST-T changes.  LABS:  BMP Latest Ref Rng & Units 12/13/2019 11/30/2019 11/14/2019  Glucose 70 - 99 mg/dL 101(H) 103(H) 100(H)  BUN 8 - 23 mg/dL _0 Creatinine 0.44 - 1.00 mg/dL 0.88 0.85 0.97  BUN/Creat Ratio 12 - 28 - - -  Sodium 135 - 145 mmol/L 139 139 140  Potassium 3.5 - 5.1 mmol/L 3.9 3.5 3.7  Chloride 98 - 111 mmol/L 100 100 101  CO2 22 - 32 mmol/L _1 Calcium 8.9 - 10.3 mg/dL 9.2 9.1 9.4    Hepatic Function Latest Ref Rng & Units 12/13/2019 11/30/2019 11/14/2019  Total Protein 6.5 - 8.1 g/dL 8.2(H) 8.0 8.1  Albumin 3.5 - 5.0 g/dL 3.3(L) 3.4(L) 3.3(L)  AST 15 - 41 U/L 31 31 32  ALT 0 - 44 U/L _2 Alk Phosphatase 38 - 126 U/L 81 83  86  Total Bilirubin 0.3 - 1.2 mg/dL 0.3 0.3 0.2(L)  Bilirubin, Direct 0.0 - 0.3 mg/dL - - -    CBC Latest Ref Rng & Units 12/13/2019 11/30/2019 11/14/2019  WBC 4.0 - 10.5 K/uL 6.5 6.1 6.2  Hemoglobin 12.0 - 15.0 g/dL 10.0(L) 9.8(L) 9.7(L)  Hematocrit 36.0 - 46.0 % 31.1(L) 30.7(L) 30.6(L)  Platelets 150 - 400 K/uL 233 256 193   Lab Results  Component Value Date   MCV 101.3 (H) 12/13/2019   MCV 101.7 (H) 11/30/2019   MCV 103.0 (H) 11/14/2019   Lab Results  Component Value Date   TSH 1.360 11/06/2019   Lab Results  Component Value Date   HGBA1C 5.9 (H) 10/04/2018    Lipid Panel     Component Value Date/Time   CHOL 201 (H) 11/06/2019 1010   TRIG 147 11/06/2019 1010   TRIG 85 10/05/2006 1022   HDL 52 11/06/2019 1010   CHOLHDL 3.9 11/06/2019 1010   CHOLHDL 3 03/09/2018 0909   VLDL 21.8 03/09/2018 0909   LDLCALC 123 (H) 11/06/2019 1010   RADIOLOGY: NM PET Image Restag (PS) Skull Base To Thigh  Result Date: 12/11/2019 CLINICAL DATA:  Subsequent treatment strategy for gallbladder cancer. EXAM: NUCLEAR MEDICINE PET SKULL BASE TO THIGH TECHNIQUE: 7.2 mCi F-18 FDG was injected intravenously. Full-ring PET imaging was performed from the skull base to thigh after the radiotracer. CT data was obtained and used for attenuation correction and anatomic localization. Fasting blood glucose: 101 mg/dl COMPARISON:  Multiple exams, including 08/22/2019 CT scan and PET-CT from 02/20/2019 FINDINGS: Mediastinal blood pool activity: SUV max 2.2 Liver activity: SUV max 3.4 NECK: No significant abnormal hypermetabolic activity in this region. 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 CHEST: Newly hypermetabolic left supraclavicular lymph node measures 1.0 cm in short axis on image 40/4 (previously 0.5 cm on 08/22/2019) with maximum SUV 5.0, compatible with malignant involvement. Faintly accentuated metabolic activity medially in the  left thyroid lobe, maximum SUV 4.3, corresponding with a 1.6 cm nodule in this vicinity. This nodule was not previously significantly hypermetabolic. Para-aortic lymph node just behind the esophagus on image  62/4 measures 0.6 cm in diameter, with maximum SUV 4.6, concerning for malignancy. Incidental CT findings: Right middle lobe atelectasis is not appreciably changed. Coronary, aortic arch, and branch vessel atherosclerotic vascular disease. Prior CABG. Cardiomegaly. Mild calcifications along the pericardium. ABDOMEN/PELVIS: The mass in the gallbladder fundus measures approximately 2.6 by 2.8 cm on image 113/4 with maximum SUV 9.7, previously 7.6. Worsening size and activity in porta hepatis and retroperitoneal lymph nodes which are now significantly more numerous and enlarged. An enlarged index porta hepatis node difficult to measure due to indistinct tissue planes has a maximum SUV of 12.1, an index retrocaval node measuring 1.8 cm in short axis on image 113/4 (formerly 1.0 cm on 08/22/2019) has maximum SUV of 15.5 (formerly 4.5 on prior PET-CT). A right common iliac node measures 1.9 cm in short axis on image 130/4 with maximum SUV of 15.3, previously 1.1 cm on 08/22/2019 with prior maximum SUV of 4.3. Incidental CT findings: Aortoiliac atherosclerotic vascular disease. SKELETON: Considerable progressive enlargement of the extraosseous component of the mass involving the left posterior acetabulum with associated abnormal thickening of regional musculature including the obturator internus, maximum SUV of this mass is 25.0, previously 9.0. Sclerotic metastatic lesion involving the left ischium and posterior wall of the acetabular. Incidental CT findings: Postoperative findings in the lower lumbar spine. Stable small sclerotic lesion of the T10 vertebral body posteriorly without accentuated metabolic activity. Prior median sternotomy. Cervical plate and screw fixators. Postoperative findings in the lower lumbar  spine. IMPRESSION: 1. Significant progression of malignancy, with increased activity in the gallbladder mass; considerable increase in size and activity of porta hepatis, retroperitoneal, and upper pelvic adenopathy; significant increase in size and metabolic activity of the left ischial metastatic lesion with extensive extraosseous component; and new mildly hypermetabolic left supraclavicular and thoracic periaortic lymph nodes suspicious for malignant involvement. 2. Faintly increased activity in a left thyroid nodule which was not previously hypermetabolic. Given the relatively low-grade activity, this may be amenable to surveillance, but a significant minority of thyroid nodules with hypermetabolic activity can represent thyroid cancer. 3. Other imaging findings of potential clinical significance: Chronic paranasal sinusitis. Chronic right middle lobe atelectasis. Aortic Atherosclerosis (ICD10-I70.0). Coronary atherosclerosis with cardiomegaly. Electronically Signed   By: Van Clines M.D.   On: 12/11/2019 17:06    IMPRESSION:  1. CAD in native artery   2. S/P CABG (coronary artery bypass graft)   3. Essential hypertension   4. Metastatic adenocarcinoma ([place])   5. Ischemic cardiomyopathy   6. Hyperlipidemia with target LDL less than 70   7. Mild intermittent asthma without complication     ASSESSMENT AND PLAN: Mrs. Oakley Orban is a 79 year-old African-American female who suffered a NSTEMI in October 2015 which was felt to be due to subtotal high-grade calcified RCA with severe diffuse distal disease beyond her subtotal stenosis.  Cassie Campbell also has a severely diffusely diseased mid distal LAD which is not amenable for revascularization.  At the time of her intervention, the smallest balloon catheter that was available in the catheterization laboratory was a 1.20 mm and this was unable to cross the stenosis.  Cassie Campbell was treated medically and did well until October 2019 when Cassie Campbell developed recurrent  angina consistent with unstable symptoms necessitating repeat catheterization.  Cassie Campbell had severely calcified CAD  and unsuccessful CABG revascularization surgery.  Cassie Campbell did well from a cardiac surgical standpoint but unfortunately was found to have mediastinal adenopathy and has been diagnosed with metastatic adenocarcinoma of gallbladder cancer etiology.  Her  echo Doppler study prior to initiating chemotherapy showed an EF of 40 to 45% with akinesis of the mid apical anteroseptal, inferoseptal, and apical myocardium with anterior hypokinesis.  Cassie Campbell had grade 1 diastolic dysfunction and abnormal tissue Doppler consistent with high ventricular filling pressures.  Systolic peak pressure was elevated at 38 mm.  Global strain was abnormal.  Cassie Campbell initiated chemotherapy on December 15, 2018  and tolerated her initial treatment fairly well. Cassie Campbell ss followed by Dr. Burr Medico and subsequent imaging studies had initially shown a reduction of her tumor burden.  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  At present, Cassie Campbell has been without anginal symptoms and denies any significant symptoms of CHF.  Cassie Campbell appears euvolemic on exam.  Her asthma is well controlled and there is no wheezing.  Her blood pressure today is stable on current therapy.  Cassie Campbell continues to be on bisoprolol 10 mg in addition to furosemide 20 mg daily.  Cassie Campbell continues to be on Plavix 75 mg daily.  Cassie Campbell was advised to stop taking atorvastatin which Cassie Campbell has now held as of today 80 mg in anticipation of her chemotherapy next week.  I have scheduled her for a echo Doppler study which will be done on Monday January 07, 2020 to obtain a new baseline prior to reinitiation of her new second line chemotherapeutic regimen.  I will see her in 4 to 6 months for follow-up Cardiologic evaluation   Time spent:  75mnutes TTroy Sine MD, [facility] 01/06/2020 11:09 AM

## 2020-01-04 NOTE — Telephone Encounter (Signed)
Called pt. Stated that she was wrong about the atorvastatin and stated she meant Omeprazole. She just wants to switch statin's because atorvastatin makes her legs ache. Will forward to Dr Claiborne Billings for review.

## 2020-01-06 ENCOUNTER — Encounter: Payer: Self-pay | Admitting: Cardiovascular Disease

## 2020-01-07 ENCOUNTER — Other Ambulatory Visit: Payer: Self-pay

## 2020-01-07 ENCOUNTER — Ambulatory Visit: Payer: Medicare Other

## 2020-01-07 ENCOUNTER — Ambulatory Visit
Admission: RE | Admit: 2020-01-07 | Discharge: 2020-01-07 | Disposition: A | Discharge status: Home / Self Care | Payer: Medicare Other | Source: Ambulatory Visit | Attending: Radiation Oncology | Admitting: Radiation Oncology

## 2020-01-07 ENCOUNTER — Other Ambulatory Visit (HOSPITAL_COMMUNITY): Payer: Self-pay | Admitting: Cardiovascular Disease

## 2020-01-07 ENCOUNTER — Ambulatory Visit (HOSPITAL_COMMUNITY): Payer: Medicare Other | Attending: Cardiovascular Disease

## 2020-01-07 DIAGNOSIS — Z510 Encounter for antineoplastic radiation therapy: Secondary | ICD-10-CM | POA: Diagnosis not present

## 2020-01-07 DIAGNOSIS — I2510 Atherosclerotic heart disease of native coronary artery without angina pectoris: Secondary | ICD-10-CM | POA: Diagnosis not present

## 2020-01-07 DIAGNOSIS — Z951 Presence of aortocoronary bypass graft: Secondary | ICD-10-CM | POA: Insufficient documentation

## 2020-01-07 DIAGNOSIS — C23 Malignant neoplasm of gallbladder: Secondary | ICD-10-CM | POA: Insufficient documentation

## 2020-01-07 DIAGNOSIS — I4510 Unspecified right bundle-branch block: Secondary | ICD-10-CM | POA: Insufficient documentation

## 2020-01-07 DIAGNOSIS — I340 Nonrheumatic mitral (valve) insufficiency: Secondary | ICD-10-CM | POA: Insufficient documentation

## 2020-01-07 DIAGNOSIS — I10 Essential (primary) hypertension: Secondary | ICD-10-CM | POA: Diagnosis not present

## 2020-01-07 DIAGNOSIS — I252 Old myocardial infarction: Secondary | ICD-10-CM | POA: Insufficient documentation

## 2020-01-07 DIAGNOSIS — E785 Hyperlipidemia, unspecified: Secondary | ICD-10-CM | POA: Insufficient documentation

## 2020-01-08 ENCOUNTER — Ambulatory Visit
Admission: RE | Admit: 2020-01-08 | Discharge: 2020-01-08 | Disposition: A | Discharge status: Home / Self Care | Payer: Medicare Other | Source: Ambulatory Visit | Attending: Radiation Oncology | Admitting: Radiation Oncology

## 2020-01-08 ENCOUNTER — Encounter: Payer: Self-pay | Admitting: Radiation Oncology

## 2020-01-08 ENCOUNTER — Other Ambulatory Visit: Payer: Self-pay

## 2020-01-08 DIAGNOSIS — Z510 Encounter for antineoplastic radiation therapy: Secondary | ICD-10-CM | POA: Diagnosis not present

## 2020-01-08 NOTE — Progress Notes (Signed)
[place]   [phone] [phone]   Clinic Follow up Note   Patient Care Team: Tanda Rockers, MD as PCP - General (Pulmonary Disease) Troy Sine, MD as PCP - Cardiology (Cardiology) 01/09/2020  CHIEF COMPLAINT: F/u metastatic gallbladder disease  SUMMARY OF ONCOLOGIC HISTORY: Oncology History Overview Note  Cancer Staging Gallbladder cancer [facility]) Staging form: Gallbladder, AJCC 8th Edition - Clinical stage from 11/13/2018: Stage IVB (cTX, cN2, pM1) - Signed by Truitt Merle, MD on 12/15/2018     Gallbladder cancer ([place])  10/04/2018 Pathology Results   10/04/2018 Surgical Pathology Diagnosis 1. Lymph node, biopsy, mediastinal - LYMPH NODE WITH METASTATIC ADENOCARCINOMA. - SEE MICROSCOPIC DESCRIPTION 2. Plaque, coronary artery - CALCIFIED ATHEROSCLEROTIC PLAQUE.   10/14/2018 Miscellaneous   Foundation One:  MSI stable tumor mutation burden 3Muts/mb ERBB2 amplification CCNE1 amplification TP53 mutation(+) CDK6 amplification  HGF amplification    11/13/2018 Cancer Staging   Staging form: Gallbladder, AJCC 8th Edition - Clinical stage from 11/13/2018: Stage IVB (cTX, cN2, pM1) - Signed by Truitt Merle, MD on 12/15/2018   11/17/2018 Imaging   11/17/2018 CT CAP IMPRESSION: 1. 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 This is associated with extensive upper abdominal and retroperitoneal lymphadenopathy, as well as metastatic lymphadenopathy in the posterior mediastinum and left supraclavicular region. There is also a metastatic lesion to the left ischium. 2. 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 3. Aortic atherosclerosis, in addition to left main and 3 vessel coronary artery disease. Status post median sternotomy for CABG including LIMA to the LAD. 4. There are calcifications of the aortic valve. Echocardiographic correlation for evaluation of potential valvular  dysfunction may be warranted if clinically indicated.    11/21/2018 Initial Diagnosis   Gallbladder cancer ([place])   11/27/2018 Pathology Results   11/27/2018 CA19-9 immunohistochemical stain Per request, a CA19-9 immunohistochemical stain was performed at an outside institution revealing positive staining in the tumor cells.    12/15/2018 - 11/30/2019 Chemotherapy   First line chemo cisplatin and gemcitabine every 2 weeks on 12/15/18. Had 1 month chemo break in April, restarted on 04/20/19. Stopped 11/30/19 due to disease progression.    02/20/2019 PET scan   Restaging PET scan:  IMPRESSION: 1. Positive response to therapy at all primary and metastatic sites. Persistent primary and metastatic lesions do have intense hypermetabolic activity albeit reduced. 2. Decrease in size and hypermetabolic activity of mass lesion in the gallbladder fundus. 3. Interval decrease in size and metabolic activity of periportal adenopathy. 4. Decrease in size and metabolic activity of periaortic lymph nodes. 5. Interval decrease in size and metabolic activity of destructive lesion in the LEFT inferior pubic ramus with new sclerosis. 6. No new or progressive disease.   05/29/2019 Imaging   CT CAP W Contrast  IMPRESSION: Known gallbladder adenocarcinoma, difficult to compare to recent PET, improved from prior CT.  Upper abdominal/retroperitoneal lymphadenopathy, slightly improved from prior PET.  Osseous metastasis involving the left inferior pubic ramus, grossly unchanged.  No evidence of metastatic disease in the chest.  Additional ancillary findings as above.   08/22/2019 Imaging   CT CAP W Contrast  IMPRESSION: 1. Stable soft tissue mass involving the gallbladder fundus. 2. Stable mild porta hepatis and retroperitoneal lymphadenopathy. 3. Stable sclerotic bone metastases. 4. No new or progressive metastatic disease identified. 5. Stable 2.3 cm left thyroid lobe nodule.   11/14/2019  Imaging   CT Left Hip WO  contrast  IMPRESSION: 1. Stable chronic  sclerotic metastasis involving the left ischium. Associated soft tissue components have progressed, but there is no evidence of pathologic fracture. 2. No acute osseous findings. 3. 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   12/11/2019 PET scan   IMPRESSION: 1. Significant progression of malignancy, with increased activity in the gallbladder mass; considerable increase in size and activity of porta hepatis, retroperitoneal, and upper pelvic adenopathy; significant increase in size and metabolic activity of the left ischial metastatic lesion with extensive extraosseous component; and new mildly hypermetabolic left supraclavicular and thoracic periaortic lymph nodes suspicious for malignant involvement. 2. Faintly increased activity in a left thyroid nodule which was not previously hypermetabolic. Given the relatively low-grade activity, this may be amenable to surveillance, but a significant minority of thyroid nodules with hypermetabolic activity can represent thyroid cancer. 3. Other imaging findings of potential clinical significance: Chronic paranasal sinusitis. Chronic right middle lobe atelectasis. Aortic Atherosclerosis (ICD10-I70.0). Coronary atherosclerosis with cardiomegaly.   12/17/2019 - 01/07/2020 Radiation Therapy   Palliative Radiation with Dr. Lisbeth Renshaw 12/17/19-01/07/20    Chemotherapy   PENDING oral Xeloda    01/09/2020 -  Chemotherapy   The patient had pertuzumab (PERJETA) 840 mg in sodium chloride 0.9 % 250 mL chemo infusion, 840 mg, Intravenous, Once, 0 of 5 cycles trastuzumab-anns (KANJINTI) 441 mg in sodium chloride 0.9 % 250 mL chemo infusion, 8 mg/kg = 441 mg (100 % of original dose 8 mg/kg), Intravenous,  Once, 0 of 5 cycles Dose modification: 8 mg/kg (original dose 8 mg/kg, Cycle 1, Reason: Other (see comments), Comment: Insurance requires Kanjinti), 6 mg/kg (Cycle 2, Reason: Other (see  comments), Comment: insurance requires kanjinti)  for chemotherapy treatment.      CURRENT THERAPY:  -Palliative Radiation with Dr. Lisbeth Renshaw 12/16/09-01/06/19 -Zometa q12weeks starting 01/26/19 -PENDING second line single agent oral Xeloda 1 week on and 1 week off   INTERVAL HISTORY: Ms. Latorre returns for f/u as scheduled. She completed RT on 01/08/20. Her left leg pain and ambulation have significantly improved. Denies any pain currently. The leg remains a little weak so she continues to use cane. But overall much better. She does not require oxycodone anymore. Takes NSAID/tylenol PRN but not every day. She remains functional and independent at home. Sometimes gets winded with exertion, no cough or chest pain. Neuropathy in fingers is stable. Appetite and energy fluctuate. Denies n/vc/d, dysuria. No recent fever or chills.    MEDICAL HISTORY:  Past Medical History:  Diagnosis Date  . Allergic rhinitis   . Arthritis   . Asthma    xolair s 8/05 ?11/07; mastered hfa 12/20/08  . Benign positional vertigo   . CAD (coronary artery disease)    a. 09/2014 NSTEMI s/p LHC with sig 2V dz. dLAD diffusely diseased and not suitable for PCI. unsuccessful RCA PCI d/t heavy calcifications  . gallbladder ca dx'd 10/2018  . GERD (gastroesophageal reflux disease)   . Heart attack ([place])    09/2014  . Hyperlipidemia    <130 ldl pos fm hx, bp  . Hypertension   . Osteopenia    dexa 08/22/07 AP spine + 1.1, left femur -1.3, right femur -.8; dexa 10/06/09 +1.6, left femur =1.6, right femur -.  . PONV (postoperative nausea and vomiting)   . Ruptured disc, cervical   . 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 BREAST SURGERY  10-26-10   Rt. breast bx--for microcalcifications in rt. retroareolar region--dx was  hyalinized fibroadenoma  . BUNIONECTOMY Bilateral   . CARDIAC CATHETERIZATION     2015  . CATARACT  EXTRACTION Right 02/02/2018   Dr Satira Sark  . CATARACT EXTRACTION Left 03/30/2018  . [place] SURGERY  12/01  . CORONARY ARTERY BYPASS GRAFT N/A 10/04/2018   Procedure: CORONARY ARTERY BYPASS GRAFTING (CABG) times 2 using left  Internal mammary artery to LAD, left greater saphenous vein - open harvest.;  Surgeon: [name], MD;  Location: [facility];  Service: Open Heart Surgery;  Laterality: N/A;  . IR IMAGING GUIDED PORT INSERTION  12/08/2018  . LEFT HEART CATH AND CORONARY ANGIOGRAPHY N/A 09/27/2018   Procedure: LEFT HEART CATH AND CORONARY ANGIOGRAPHY;  Surgeon: Troy Sine, MD;  Location: [facility] CV [facility];  Service: Cardiovascular;  Laterality: N/A;  . LEFT HEART CATHETERIZATION WITH CORONARY ANGIOGRAM N/A 10/16/2014   Procedure: LEFT HEART CATHETERIZATION WITH CORONARY ANGIOGRAM;  Surgeon: Blane Ohara, MD;  Location: [facility] CATH LAB;  Service: Cardiovascular;  Laterality: N/A;  . Left L4-5 transforaminal lumbar interbody fusion with Depuy cage, rods and screws, local and allograft bone graft, Vivigen; bilateral decompression/partial hemilaminectomy lumbar five-sacral one  07/2017  . PERCUTANEOUS CORONARY ROTOBLATOR INTERVENTION (PCI-R) N/A 10/17/2014   Procedure: PERCUTANEOUS CORONARY ROTOBLATOR INTERVENTION (PCI-R);  Surgeon: Troy Sine, MD;  Location: [facility] CATH LAB;  Service: Cardiovascular;  Laterality: N/A;  . TEE WITHOUT CARDIOVERSION N/A 10/04/2018   Procedure: TRANSESOPHAGEAL ECHOCARDIOGRAM (TEE);  Surgeon: [name], MD;  Location: [facility];  Service: Open Heart Surgery;  Laterality: N/A;  . VEIN LIGATION AND STRIPPING Left   . VIDEO BRONCHOSCOPY Bilateral 02/05/2015   Procedure: VIDEO BRONCHOSCOPY WITHOUT FLUORO;  Surgeon: Tanda Rockers, MD;  Location: [facility] ENDOSCOPY;  Service: Endoscopy;  Laterality: Bilateral;  . VULVA /PERINEUM BIOPSY  12-30-10   --epidermoid cyst    I have reviewed the social history and family history with the patient and they are  unchanged from previous note.  ALLERGIES:  is allergic to fish-derived products; penicillins; aspirin; brilinta [ticagrelor]; codeine phosphate; and diphenhydramine hcl.  MEDICATIONS:  Current Outpatient Medications  Medication Sig Dispense Refill  . albuterol (PROAIR HFA) 108 (90 Base) MCG/ACT inhaler INHALE 1 TO 2 PUFFS BY MOUTH EVERY 4 HOURS AS NEEDED FOR WHEEZING 8.5 g 3  . atorvastatin (LIPITOR) 80 MG tablet Take 80 mg by mouth daily.    . bisoprolol (ZEBETA) 10 MG tablet TAKE 1 TABLET(10 MG) BY MOUTH DAILY 90 tablet 1  . budesonide-formoterol (SYMBICORT) 160-4.5 MCG/ACT inhaler INHALE 2 PUFFS BY MOUTH EVERY 12 HOURS 30.6 g 3  . capecitabine (XELODA) 500 MG tablet Take 3 tablets (1,500 mg total) by mouth 2 (two) times daily after a meal. 7 days on and 7 days off 84 tablet 1  . cetirizine (ZYRTEC) 10 MG tablet Take 10 mg by mouth at bedtime as needed for allergies.    . cholecalciferol 2000 units TABS Take 1 tablet (2,000 Units total) by mouth daily. 30 tablet 3  . clopidogrel (PLAVIX) 75 MG tablet TAKE 1 TABLET(75 MG) BY MOUTH DAILY 90 tablet 3  . dextromethorphan-guaiFENesin (MUCINEX DM) 30-600 MG per 12 hr tablet Take 1-2 tablets by mouth every 12 (twelve) hours as needed for cough (with flutter).     . famotidine (PEPCID) 20 MG tablet Take 20 mg by mouth as needed.     . ferrous sulfate 325 (65 FE) MG tablet Take 650 mg by mouth daily with breakfast.     . fluticasone (FLONASE) 50  MCG/ACT nasal spray Place 1-2 sprays into both nostrils 2 (two) times daily as needed for allergies or rhinitis.    . furosemide (LASIX) 20 MG tablet Take 1 tablet (20 mg total) by mouth daily. 180 tablet 3  . gabapentin (NEURONTIN) 100 MG capsule Take 1 capsule (100 mg total) by mouth 3 (three) times daily. Start at 126m at night first, and gradually increase to 3064mat night, and 10045mm and 100m1m over the next month 90 capsule 0  . magic mouthwash SOLN Take 5 mLs by mouth 4 (four) times daily. Leave  Benadryl out of compound since pt allergic to it. Use hydrocortisone & nystatin. Swish & swallow or spit for thrush 240 mL 1  . MAGNESIUM-OXIDE 400 (241.3 Mg) MG tablet TAKE 1 TABLET(400 MG) BY MOUTH THREE TIMES DAILY 90 tablet 1  . meclizine (ANTIVERT) 25 MG tablet TAKE 2 TABLETS BY MOUTH THREE TIMES DAILY AS NEEDED 24 tablet 2  . montelukast (SINGULAIR) 10 MG tablet TAKE 1 TABLET(10 MG) BY MOUTH AT BEDTIME 90 tablet 1  . Multiple Vitamin (MULTIVITAMIN) capsule Take 1 capsule by mouth daily.     . deMarland Kitchenamethasone (DECADRON) 4 MG tablet Take 1 tablet (4 mg total) by mouth 2 (two) times daily with a meal. (Patient taking differently: Take 4 mg by mouth 2 (two) times daily with a meal. Takes prn SOB after chemo) 20 tablet 0  . oxyCODONE (OXY IR/ROXICODONE) 5 MG immediate release tablet Take 1 tablet (5 mg total) by mouth every 8 (eight) hours as needed for severe pain. 45 tablet 0   No current facility-administered medications for this visit.    PHYSICAL EXAMINATION: ECOG PERFORMANCE STATUS: 1 - Symptomatic but completely ambulatory  Vitals:   01/09/20 0834  BP: (!) 142/78  Pulse: 77  Resp: 18  Temp: 98 F (36.7 C)  SpO2: 98%   Filed Weights   01/09/20 0834  Weight: 120 lb 6.4 oz (54.6 kg)    GENERAL:alert, no distress and comfortable SKIN: no rash  EYES: sclera clear LUNGS: clear with normal breathing effort HEART: regular rate & rhythm, no lower extremity edema ABDOMEN:abdomen soft, non-tender and normal bowel sounds. No hepatomegaly  NEURO: alert & oriented x 3 with fluent speech, normal gait PAC without erythema   LABORATORY DATA:  I have reviewed the data as listed CBC Latest Ref Rng & Units 01/09/2020 12/13/2019 11/30/2019  WBC 4.0 - 10.5 K/uL 5.8 6.5 6.1  Hemoglobin 12.0 - 15.0 g/dL 10.9(L) 10.0(L) 9.8(L)  Hematocrit 36.0 - 46.0 % 34.9(L) 31.1(L) 30.7(L)  Platelets 150 - 400 K/uL 321 233 256     CMP Latest Ref Rng & Units 01/09/2020 12/13/2019 11/30/2019  Glucose 70 -  99 mg/dL 99 101(H) 103(H)  BUN 8 - 23 mg/dL _0 Creatinine 0.44 - 1.00 mg/dL 0.86 0.88 0.85  Sodium 135 - 145 mmol/L 140 139 139  Potassium 3.5 - 5.1 mmol/L 4.0 3.9 3.5  Chloride 98 - 111 mmol/L 102 100 100  CO2 22 - 32 mmol/L _1 Calcium 8.9 - 10.3 mg/dL 9.6 9.2 9.1  Total Protein 6.5 - 8.1 g/dL 8.8(H) 8.2(H) 8.0  Total Bilirubin 0.3 - 1.2 mg/dL 0.4 0.3 0.3  Alkaline Phos 38 - 126 U/L 74 81 83  AST 15 - 41 U/L 33 31 31  ALT 0 - 44 U/L _2 RADIOGRAPHIC STUDIES: I have personally reviewed the radiological images as listed and agreed with  the findings in the report. No results found.   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 -Started first line cisplatin and gemcitabine on 12/15/2018 q2 weeks due to advanced age and CHF -She is also onZometa q37monthstarting 01/26/19 -Due to worsening fatigue her chemo was dose reduced by 20% and given q2 weeks  -S/p cycle 13 day 1 on 12/11. She tolerated treatment well overall with intermittent fatigue and G1 CIPN, otherwise no significant toxicities.  -Unfortunately PET from 12/11/19 showed significant progression of diffuse nodal metastasis including new left supraclavicular and periaortic LN, increased activity of gallbladder mass, and increased size and metabolic activity of left ischial lesion with extensive extraosseous component. Cisplatin and gemcitabine was discontinued -Dr. FBurr Medicorecommended second line single agent Xeloda 1500 mg BID for 7 days on and 7 days off. She has received the supply and will begin 01/10/20. We reviewed potential side effects and symptom management -Although her tumor is HER2(+), her recent echo shows significant cardiomyopathy with EF 25-30%, unfortunately she is not a candidate for herceptin/perjeta. She will f/u with cardiology.  I discussed this with her today. -Ms. BGatzis clinically doing well. Left leg/hip pain nearly resolved. Weight is stable. She remains active and independent.  -CBC, CMP, Mg reviewed. CA 19-9 is pending from today. Overall labs adequate to proceed with cycle 1 Xeloda on 1/21.  -return in 2 weeks, on 2/1 or 2/2. If she tolerates first cycle well, we can see her every other cycle in the future. She agrees with the plan.  2. Left leg pain  -she has acute on chronic left leg/hip pain and weakness. Worsened recently with focal tenderness at the left ischial tuberosity.  -saw ortho Dr. NLouanne Skyerecently and received oxycodone PRN -CT on 11/14/19 showed stable metastasis of the left ischium with progressive surrounding soft tissue -PET on 12/22 showed significant increase in size and metabolic activity of the lesion and extraosseous component  -She completed palliative RT per Dr. MLisbeth Renshawfrom 12/17/19 to 01/08/20 -pain and ambulation significant improved after RT. Still little weak so she uses a cane. Does not require oxycodone. Pain managed with NSAID/tylenol PRN  3. G1 CIPN -Developed with cycle 11 cisplatin/gemcitabine, mild intermittent tingling to fingertips -no functional difficulties, tuning fork exam is normal -Improved on oral B complex vitamin, continue monitoring    4. CAD, S/P CABGX2 on 09/27/2018, EF 40-45% -Currently on Plavix. She also takes lasixjust once daily. Recently switched PPI to pepcid due to DDI with Xeloda -Echo 01/07/20 showed markedly reduced EF to 25-30% with severely decreased left ventricular function, Grade III diastolic dysfunction -She will continue f/u with Dr. KClaiborne Billings  5. Asthma, dyspnea -Currently on Symbicort, Singulair, and albuterol as needed -f/u with pulmonary -baseline exertional dyspnea is stable -f/u with Dr. WMelvyn Novasscheduled in 2/21  6.Goal of care discussion -The patient understands the goal of care is palliative. -she is full code for  now  7. Mild Anemia -Secondary to chemoand her open heart surgery; shedid not takeoral iron because it causes bloating.  -Hgb 10.9 today, improved   PLAN: -Labs, echo reviewed -Reviewed Xeloda dosing, instructions, potential side effects, and symptom management -Begin Xeloda 1500 mg BID 1 week on/1 week off on 01/10/20 -F/u 2/1 or 2/2  No problem-specific Assessment & Plan notes found for this encounter.   No orders of the defined types were placed  in this encounter.  All questions were answered. The patient knows to call the clinic with any problems, questions or concerns. No barriers to learning was detected. I spent 20 minutes counseling the patient face to face. The total time spent in the appointment was 25 minutes and more than 50% was on counseling and review of test results     Alla Feeling, NP 01/09/20

## 2020-01-09 ENCOUNTER — Encounter: Payer: Self-pay | Admitting: Nurse Practitioner

## 2020-01-09 ENCOUNTER — Inpatient Hospital Stay: Payer: Medicare Other

## 2020-01-09 ENCOUNTER — Other Ambulatory Visit: Payer: Self-pay

## 2020-01-09 ENCOUNTER — Inpatient Hospital Stay (HOSPITAL_BASED_OUTPATIENT_CLINIC_OR_DEPARTMENT_OTHER): Payer: Medicare Other | Admitting: Nurse Practitioner

## 2020-01-09 VITALS — BP 142/78 | HR 77 | Temp 98.0°F | Resp 18 | Ht 59.0 in | Wt 120.4 lb

## 2020-01-09 DIAGNOSIS — C23 Malignant neoplasm of gallbladder: Secondary | ICD-10-CM

## 2020-01-09 DIAGNOSIS — Z95828 Presence of other vascular implants and grafts: Secondary | ICD-10-CM

## 2020-01-09 DIAGNOSIS — I2510 Atherosclerotic heart disease of native coronary artery without angina pectoris: Secondary | ICD-10-CM | POA: Diagnosis not present

## 2020-01-09 LAB — CBC WITH DIFFERENTIAL (CANCER CENTER ONLY)
Abs Immature Granulocytes: 0.02 10*3/uL (ref 0.00–0.07)
Basophils Absolute: 0 10*3/uL (ref 0.0–0.1)
Basophils Relative: 1 %
Eosinophils Absolute: 0.9 10*3/uL — ABNORMAL HIGH (ref 0.0–0.5)
Eosinophils Relative: 15 %
HCT: 34.9 % — ABNORMAL LOW (ref 36.0–46.0)
Hemoglobin: 10.9 g/dL — ABNORMAL LOW (ref 12.0–15.0)
Immature Granulocytes: 0 %
Lymphocytes Relative: 9 %
Lymphs Abs: 0.5 10*3/uL — ABNORMAL LOW (ref 0.7–4.0)
MCH: 31.1 pg (ref 26.0–34.0)
MCHC: 31.2 g/dL (ref 30.0–36.0)
MCV: 99.7 fL (ref 80.0–100.0)
Monocytes Absolute: 0.5 10*3/uL (ref 0.1–1.0)
Monocytes Relative: 9 %
Neutro Abs: 3.9 10*3/uL (ref 1.7–7.7)
Neutrophils Relative %: 66 %
Platelet Count: 321 10*3/uL (ref 150–400)
RBC: 3.5 MIL/uL — ABNORMAL LOW (ref 3.87–5.11)
RDW: 17.2 % — ABNORMAL HIGH (ref 11.5–15.5)
WBC Count: 5.8 10*3/uL (ref 4.0–10.5)
nRBC: 0 % (ref 0.0–0.2)

## 2020-01-09 LAB — CMP (CANCER CENTER ONLY)
ALT: 14 U/L (ref 0–44)
AST: 33 U/L (ref 15–41)
Albumin: 3.5 g/dL (ref 3.5–5.0)
Alkaline Phosphatase: 74 U/L (ref 38–126)
Anion gap: 11 (ref 5–15)
BUN: 13 mg/dL (ref 8–23)
CO2: 27 mmol/L (ref 22–32)
Calcium: 9.6 mg/dL (ref 8.9–10.3)
Chloride: 102 mmol/L (ref 98–111)
Creatinine: 0.86 mg/dL (ref 0.44–1.00)
GFR, Est AFR Am: >60 mL/min (ref >60)
GFR, Estimated: >60 mL/min (ref >60)
Glucose, Bld: 99 mg/dL (ref 70–99)
Potassium: 4 mmol/L (ref 3.5–5.1)
Sodium: 140 mmol/L (ref 135–145)
Total Bilirubin: 0.4 mg/dL (ref 0.3–1.2)
Total Protein: 8.8 g/dL — ABNORMAL HIGH (ref 6.5–8.1)

## 2020-01-09 LAB — MAGNESIUM: Magnesium: 1.7 mg/dL (ref 1.7–2.4)

## 2020-01-09 MED ORDER — SODIUM CHLORIDE 0.9% FLUSH
10.0000 mL | INTRAVENOUS | Status: DC | PRN
  Administered 2020-01-09: 10 mL via INTRAVENOUS
  Filled 2020-01-09: qty 10

## 2020-01-09 MED ORDER — HEPARIN SOD (PORK) LOCK FLUSH 100 UNIT/ML IV SOLN
500.0000 [IU] | Freq: Once | INTRAVENOUS | Status: AC
  Administered 2020-01-09: 500 [IU] via INTRAVENOUS
  Filled 2020-01-09: qty 5

## 2020-01-10 ENCOUNTER — Telehealth: Payer: Self-pay | Admitting: Nurse Practitioner

## 2020-01-10 LAB — CANCER ANTIGEN 19-9: CA 19-9: 2794 U/mL — ABNORMAL HIGH (ref 0–35)

## 2020-01-10 NOTE — Telephone Encounter (Signed)
Call and spoke with Cassie Campbell, as per Dr Claiborne Billings to decrease Atorvastatin 40 mg and to give our office a call and to let us know if she continue to have leg pain, pt voice understanding and thanks for the call back

## 2020-01-10 NOTE — Telephone Encounter (Signed)
Scheduled appt per 1/20 los.  Spoke with pt and she is aware of the appt date and time.

## 2020-01-10 NOTE — Telephone Encounter (Signed)
Try to decrease atorvastatin to 40 mg and if still having myalgias after several weeks can reduce further to 20 mg or we can change to rosuvastatin 10 mg

## 2020-01-12 ENCOUNTER — Ambulatory Visit: Payer: Medicare Other

## 2020-01-14 ENCOUNTER — Telehealth: Payer: Self-pay | Admitting: Nurse Practitioner

## 2020-01-14 ENCOUNTER — Telehealth: Payer: Self-pay

## 2020-01-14 NOTE — Telephone Encounter (Signed)
Cassie Campbell called stating she has been experiencing nausea since Thursday 01/10/2020 after starting capecitabine.  She started vomiting on Saturday 01/12/2020.  Friday she took capecitabine 2 tabs BID, Saturday 1 tab BID and stopped taking them on Sunday.  She was not taking any antinausea medicine prior to taking capecitabine.  She started taking ondansetron Sunday with relief from vomiting but this am she is still nauseous.  I instructed her to take ondansetron now and I would call her back with further instructions.    I will instruct her to take ondansetron prior to capecitabine.  Do you want to change her capecitabine dose?

## 2020-01-14 NOTE — Telephone Encounter (Signed)
I spoke with Cassie Campbell. I instructed her to take Xeloda per Dr. Ernestina Penna note.  Cassie Campbell has compazine and zofran for nausea.  She verbalized understanding.  A scheduling message has been sent to rescheduled 2/1 appointments to week of 01/28/2020.

## 2020-01-14 NOTE — Telephone Encounter (Signed)
When her nausea resolves sometime this week, she can restart Xeloda at 1 tab q12h for one week on and one week off, with zofran or compazine 30 mins before each dose. Check schedule to make sure she will see [place] before second cycle. Thanks   Truitt Merle MD

## 2020-01-14 NOTE — Telephone Encounter (Signed)
I talk with patient regarding schedule  

## 2020-01-18 ENCOUNTER — Encounter: Payer: Self-pay | Admitting: Cardiovascular Disease

## 2020-01-18 ENCOUNTER — Telehealth (INDEPENDENT_AMBULATORY_CARE_PROVIDER_SITE_OTHER): Payer: Medicare Other | Admitting: Cardiovascular Disease

## 2020-01-18 ENCOUNTER — Other Ambulatory Visit: Payer: Self-pay | Admitting: Cardiovascular Disease

## 2020-01-18 VITALS — BP 117/70 | HR 72 | Ht 59.0 in | Wt 116.0 lb

## 2020-01-18 DIAGNOSIS — C799 Secondary malignant neoplasm of unspecified site: Secondary | ICD-10-CM

## 2020-01-18 DIAGNOSIS — I255 Ischemic cardiomyopathy: Secondary | ICD-10-CM

## 2020-01-18 DIAGNOSIS — Z951 Presence of aortocoronary bypass graft: Secondary | ICD-10-CM | POA: Diagnosis not present

## 2020-01-18 DIAGNOSIS — I2510 Atherosclerotic heart disease of native coronary artery without angina pectoris: Secondary | ICD-10-CM | POA: Diagnosis not present

## 2020-01-18 DIAGNOSIS — I5189 Other ill-defined heart diseases: Secondary | ICD-10-CM

## 2020-01-18 DIAGNOSIS — E785 Hyperlipidemia, unspecified: Secondary | ICD-10-CM

## 2020-01-18 DIAGNOSIS — Z79899 Other long term (current) drug therapy: Secondary | ICD-10-CM

## 2020-01-18 DIAGNOSIS — I428 Other cardiomyopathies: Secondary | ICD-10-CM

## 2020-01-18 DIAGNOSIS — I519 Heart disease, unspecified: Secondary | ICD-10-CM

## 2020-01-18 MED ORDER — LOSARTAN POTASSIUM 25 MG PO TABS: 25.0000 mg | ORAL_TABLET | Freq: Every day | ORAL | 1 refills | Status: DC

## 2020-01-18 NOTE — Progress Notes (Signed)
Virtual Visit via Telephone Note   This visit type was conducted due to national recommendations for restrictions regarding the COVID-19 Pandemic (e.g. social distancing) in an effort to limit this patient's exposure and mitigate transmission in our community.  Due to her co-morbid illnesses, this patient is at least at moderate risk for complications without adequate follow up.  This format is felt to be most appropriate for this patient at this time.  The patient did not have access to video technology/had technical difficulties with video requiring transitioning to audio format only (telephone).  All issues noted in this document were discussed and addressed.  No physical exam could be performed with this format.  Please refer to the patient's chart for her  consent to telehealth for [facility].   Date:  01/18/2020   ID:  Cassie Campbell, DOB May 07, 1941, MRN [id number]  Patient Location: Home Provider Location: Home  PCP:  Tanda Rockers, MD  Cardiologist:  Shelva Majestic, MD  Electrophysiologist:  None   Evaluation Performed:  Follow-Up Visit  Chief Complaint:  F/u of echo  History of Present Illness:    Cassie Campbell is a 79 y.o. female  has a history of CAD and underwent catheterization by Dr. Fletcher Anon with class IV symptoms on 10/16/2014.  She was found to have a diffusely diseased LAD, which was not suitable for revascularization.  She had ruled in for non-ST segment elevation myocardial infarction, felt to be due to a severely calcified almost subtotal RCA with severe diffuse calcified disease.  Initial attempt at PCI by Dr. Fletcher Anon was unsuccessful due to the severe calcification.  She was brought back to the laboratory the following day, and I performed a very long attempt at high-speed rotational atherectomy.  Unfortunately, due to the severely stenosed calcified lesion above the acute margin the Roto floppy wire was never be advanced distal enough due to severe disease beyond  this site to allow the burr to be inserted to reach the stenosis.  Multiple attempts were made to utilize wire transfer, but even the smallest catheter was never able to pass the stenosis to allow for the Roto floppy wire to be reinserted in exchange for a Fielder XT wire, which was able to cross the stenosis and advanced distally.  Consequently, the procedure was aborted.  She was sent home on increased medication regimen with potential plans for possible follow-up evaluation depending upon symptom status. During the hospitalization ranolazine was added initially at 500 mg twice a day to isosorbide mononitrate, amlodipine, and beta blocker therapy.  She has been treated with aspirin and Brilinta for dual antiplatelet therapy. When I saw her for subsequent evaluation I further titrated her ranolazine to 1000 mg twice a day and further titrated her Lopressor to 37.5 mg twice a day.  Her creatinine remained normal following her contrast load and she was mildly anemic.    She has a history of asthma and is on Symbicort 160-4.5 and Singulair.  She  has a history of hyperlipidemia, currently on Lipitor 80 mg.  She does have GERD for which he takes Pepcid as well as omeprazole.  She developed some increased wheezing on Lopressor and was changed to bisoprolol 5 mg in place of metoprolol.  She has seen Dr. Melvyn Novas for her severe chronic asthma and in the past.  She also was found to have marked atopy with significant elevation of IgG E levels.  She feels that her breathing has improved with the changed to bisoprolol  from Lopressor.   In March 2016 she had been remaining stable but  had experienced a 10-15 minute episode of chest pain during the evening which responded to nitroglycerin. When I saw her the following day she had been taking isosorbide 90 mg in the morning and I added isosorbide 30 mg at bedtime to her medical regimen of Ranexa 1000 mg twice a day , amlodipine 10 mg daily, and bisoprolol 5 mg. She denies  recurrent chest pain symptomatology since the nocturnal dose of Imdur was added.  She developed a rash and  was advised to stop both Brilinta and Ranexa.  She was started on Plavix. Her rash ultimately resolved.    Her husband passed away in on Dec 27, 2015.  She has been more active.  She denies nocturnal symptoms.  She was evaluated in the emergency room in January 2017 with right shoulder discomfort.    She underwent low back surgery in August 2018 by Dr. Louanne Skye. She tolerated surgery well.  A preoperative nuclear study showed an EF of 70% with probable apical soft tissue attenuation.  There was no ischemia.  Last saw her in November 2018 and she was doing well without chest pain or shortness of breath.  Although she has a history of mild asthma.   She was diagnosed with GERD.  She underwent successful cataract surgery right eye in February and left eye in April 2019 by Dr. Satira Sark.  Since my evaluation in June 2019 she was seen by Kerin Ransom on September 26, 2018 with a history suggestive of recurrent unstable angina.  She was hospitalized and on September 27, 2018 repeat cardiac catheterization by me was performed.  This revealed severe coronary calcification involving the LAD circumflex and RCA.  There was smooth 20 to 25% distal left main narrowing.  The LAD had 40% proximal calcified stenosis.  The LAD was severely calcified with 95% stenosis in the region of the first diagonal and there was diffuse disease in the mid segment of 50% followed by 90% mid stenosis and 80% distal stenosis.  The circumflex was calcified without high-grade obstructive disease.  The RCA was calcified and was totally occluded just prior to the acute margin.  There was extensive left-to-right collateralization to the distal RCA via the left circulation.  There was low normal LV function with an EF of 50 to 55% with a small region of focal mid mild anterolateral and posterior basal inferior hypocontractility.  Due to the severe  calcification CABG revascularization surgery was recommended.  She underwent CABG surgery x2 with her LIMA to her LAD and SVG to the PDA.  However, during surgery she was  found to have a mediastinal lymph node which was sent for pathology and unfortunately showed metastatic adenocarcinoma.  She is being followed by Dr. 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  PET and CT imaging of her chest abdomen and pelvis with contrast showed diffuse adenopathy from supraclavicular to the abdomen with a hypermetabolic mass in the gallbladder and large hypermetabolic bony lesion in the posterior left pelvis.    I saw her on December 06, 2018 in follow-up of her CABG revascularization and oncologic assessments.  She underwent successful Port-A-Cath placement on December 20 and Plavix was held for this procedure.  I recommended that she undergo an echo Doppler study prior to initiating chemotherapy.  Her echo Doppler study was done on December 11, 2018 and showed an EF of 40 to 45% status post revascularization  with residual akinesis of the mid apical anteroseptal, inferoseptal and apical wall.  She had grade 1 diastolic dysfunction.  Tissue Doppler was consistent with high ventricular filling pressures.  She had mildly increased PA pressure 38 mm and abnormal global longitudinal strain pattern.  On December 15, 2018 she initiated her first course of chemotherapy.  She had some mild nausea but otherwise felt well.  She is feeling better.  She is scheduled to have a follow-up oncology evaluation later this week.  From a cardiac perspective, she denies any chest pain or shortness of breath.  She is now able to walk from room to room without her previous dyspnea or chest discomfort.  She feels improved.    I saw her in a telemedicine evaluation in April 2020 and since her prior December evaluation had been undergoing successful chemotherapy every 2 weeks with cis-platinum  andgemcitabine.She has completed 6 cycles and is currently taking a month off with plans for reinstitution on May 1. Her most recent PETscan was significantly encouraging and showed significant early benefit withreduction in tumor burden.  I last saw her in November 2020 at which time she denied any recurrent chest pain.  She was admitting to fatigue following her chemotherapy treatments.    She has recently saw Dr. Burr Medico her oncologist and her most recent PET scan from December 2020 showed significant progression of malignancy with increased activity and her gallbladder mass, increase in size and activity of the porta hepatis, retroperitoneal, and upper pelvic adenopathy.  There was also significant increase in size and metabolic activity of the left ischial metastatic lesion with extensive extraosseous component and new mildly hypermetabolic left subclavicular and thoracic periaortic lymph nodes.  As result, she is scheduled to initiate new therapy next week with Herceptin.  Dr. Burr Medico requested that I see the patient prior to initiation of new therapy and obtain an echo Doppler study.   Per Dr. Ernestina Penna request, I saw Dr. Mel Almond prior to initiating chemotherapy on January 04, 2020.  At that time, her blood pressure was stable on bisoprolol 10 mg in addition to furosemide 20 mg daily.  She continues to be on Plavix 75 mg daily.  She was not having any anginal symptoms.  She appears euvolemic on exam.  Her asthma was well controlled.  She denied any chest pain or shortness of breath at rest.  At times there was some mild shortness of breath with activity.  Her echo Doppler study on January 07, 2020 which now showed significant decline in LV function with EF 25 to 30%, restrictive physiology with grade 3 diastolic dysfunction.  She had initiated her chemotherapy with Xeloda 1 week on and 1 week off but following the first dose she became sick with nausea and vomiting and it was felt that she had taken  too high a dose.  Her dose was subsequently reduced from 1500 mg twice a day to 500 mg bid which she has tolerated.  She now presents for a follow-up telemedicine evaluation subsequent to her echo Doppler study revealing  reduced LV function.  The patient does not have symptoms concerning for COVID-19 infection (fever, chills, cough, or new shortness of breath).    Past Medical History:  Diagnosis Date  . Allergic rhinitis   . Arthritis   . Asthma    xolair s 8/05 ?11/07; mastered hfa 12/20/08  . Benign positional vertigo   . CAD (coronary artery disease)    a. 09/2014 NSTEMI s/p LHC with sig 2V dz.  dLAD diffusely diseased and not suitable for PCI. unsuccessful RCA PCI d/t heavy calcifications  . gallbladder ca dx'd 10/2018  . GERD (gastroesophageal reflux disease)   . Heart attack ([place])    09/2014  . Hyperlipidemia    <130 ldl pos fm hx, bp  . Hypertension   . Osteopenia    dexa 08/22/07 AP spine + 1.1, left femur -1.3, right femur -.8; dexa 10/06/09 +1.6, left femur =1.6, right femur -.  . PONV (postoperative nausea and vomiting)   . Ruptured disc, cervical   . 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 BREAST SURGERY  10-26-10   Rt. breast bx--for microcalcifications in rt. retroareolar region--dx was hyalinized fibroadenoma  . BUNIONECTOMY Bilateral   . CARDIAC CATHETERIZATION     2015  . CATARACT EXTRACTION Right 02/02/2018   Dr Satira Sark  . CATARACT EXTRACTION Left 03/30/2018  . [place] SURGERY  12/01  . CORONARY ARTERY BYPASS GRAFT N/A 10/04/2018   Procedure: CORONARY ARTERY BYPASS GRAFTING (CABG) times 2 using left  Internal mammary artery to LAD, left greater saphenous vein - open harvest.;  Surgeon: [name], MD;  Location: [facility];  Service: Open Heart Surgery;  Laterality: N/A;  . IR IMAGING GUIDED PORT INSERTION  12/08/2018  . LEFT HEART CATH AND CORONARY ANGIOGRAPHY N/A  09/27/2018   Procedure: LEFT HEART CATH AND CORONARY ANGIOGRAPHY;  Surgeon: Troy Sine, MD;  Location: [place] [facility];  Service: Cardiovascular;  Laterality: N/A;  . LEFT HEART CATHETERIZATION WITH CORONARY ANGIOGRAM N/A 10/16/2014   Procedure: LEFT HEART CATHETERIZATION WITH CORONARY ANGIOGRAM;  Surgeon: Blane Ohara, MD;  Location: [facility] CATH LAB;  Service: Cardiovascular;  Laterality: N/A;  . Left L4-5 transforaminal lumbar interbody fusion with Depuy cage, rods and screws, local and allograft bone graft, Vivigen; bilateral decompression/partial hemilaminectomy lumbar five-sacral one  07/2017  . PERCUTANEOUS CORONARY ROTOBLATOR INTERVENTION (PCI-R) N/A 10/17/2014   Procedure: PERCUTANEOUS CORONARY ROTOBLATOR INTERVENTION (PCI-R);  Surgeon: Troy Sine, MD;  Location: [facility] CATH LAB;  Service: Cardiovascular;  Laterality: N/A;  . TEE WITHOUT CARDIOVERSION N/A 10/04/2018   Procedure: TRANSESOPHAGEAL ECHOCARDIOGRAM (TEE);  Surgeon: [name], MD;  Location: [place];  Service: Open Heart Surgery;  Laterality: N/A;  . VEIN LIGATION AND STRIPPING Left   . VIDEO BRONCHOSCOPY Bilateral 02/05/2015   Procedure: VIDEO BRONCHOSCOPY WITHOUT FLUORO;  Surgeon: Tanda Rockers, MD;  Location: [facility] ENDOSCOPY;  Service: Endoscopy;  Laterality: Bilateral;  . VULVA /PERINEUM BIOPSY  12-30-10   --epidermoid cyst     Current Meds  Medication Sig  . albuterol (PROAIR HFA) 108 (90 Base) MCG/ACT inhaler INHALE 1 TO 2 PUFFS BY MOUTH EVERY 4 HOURS AS NEEDED FOR WHEEZING  . atorvastatin (LIPITOR) 80 MG tablet Take 80 mg by mouth daily.  . bisoprolol (ZEBETA) 10 MG tablet TAKE 1 TABLET(10 MG) BY MOUTH DAILY  . budesonide-formoterol (SYMBICORT) 160-4.5 MCG/ACT inhaler INHALE 2 PUFFS BY MOUTH EVERY 12 HOURS  . capecitabine (XELODA) 500 MG tablet Take 3 tablets (1,500 mg total) by mouth 2 (two) times daily after a meal. 7 days on and 7 days off  . cetirizine (ZYRTEC) 10 MG tablet Take 10 mg by mouth at  bedtime as needed for allergies.  . cholecalciferol 2000 units TABS Take 1 tablet (2,000 Units total) by mouth daily.  . clopidogrel (PLAVIX) 75 MG tablet TAKE 1 TABLET(75 MG) BY MOUTH DAILY  . dexamethasone (  DECADRON) 4 MG tablet Take 1 tablet (4 mg total) by mouth 2 (two) times daily with a meal. (Patient taking differently: Take 4 mg by mouth 2 (two) times daily with a meal. Takes prn SOB after chemo)  . dextromethorphan-guaiFENesin (MUCINEX DM) 30-600 MG per 12 hr tablet Take 1-2 tablets by mouth every 12 (twelve) hours as needed for cough (with flutter).   . famotidine (PEPCID) 20 MG tablet Take 20 mg by mouth as needed.   . ferrous sulfate 325 (65 FE) MG tablet Take 650 mg by mouth daily with breakfast.   . fluticasone (FLONASE) 50 MCG/ACT nasal spray Place 1-2 sprays into both nostrils 2 (two) times daily as needed for allergies or rhinitis.  . furosemide (LASIX) 20 MG tablet Take 1 tablet (20 mg total) by mouth daily.  Marland Kitchen gabapentin (NEURONTIN) 100 MG capsule Take 1 capsule (100 mg total) by mouth 3 (three) times daily. Start at 193m at night first, and gradually increase to 3074mat night, and 10052mm and 100m12m over the next month  . magic mouthwash SOLN Take 5 mLs by mouth 4 (four) times daily. Leave Benadryl out of compound since pt allergic to it. Use hydrocortisone & nystatin. Swish & swallow or spit for thrush  . MAGNESIUM-OXIDE 400 (241.3 Mg) MG tablet TAKE 1 TABLET(400 MG) BY MOUTH THREE TIMES DAILY  . meclizine (ANTIVERT) 25 MG tablet TAKE 2 TABLETS BY MOUTH THREE TIMES DAILY AS NEEDED  . montelukast (SINGULAIR) 10 MG tablet TAKE 1 TABLET(10 MG) BY MOUTH AT BEDTIME  . Multiple Vitamin (MULTIVITAMIN) capsule Take 1 capsule by mouth daily.   . oxMarland KitchenCODONE (OXY IR/ROXICODONE) 5 MG immediate release tablet Take 1 tablet (5 mg total) by mouth every 8 (eight) hours as needed for severe pain.     Allergies:   Fish-derived products, Penicillins, Aspirin, Brilinta [ticagrelor], Codeine  phosphate, and Diphenhydramine hcl   Social History   Tobacco Use  . Smoking status: Never Smoker  . Smokeless tobacco: Never Used  Substance Use Topics  . Alcohol use: No    Alcohol/week: 0.0 standard drinks  . Drug use: No     Family Hx: The patient's family history includes Breast cancer (age of onset: 36) 53 her daughter; Diabetes in her mother and sister; Heart disease in her father; Hypertension in her mother and sister; Stroke in her mother.  ROS:   Please see the history of present illness.    No fevers chills or night sweats Recent reaction to initial dose of Xeloda No PND orthopnea No awareness of palpitations No angina No leg swelling Sleeping adequately  All other systems reviewed and are negative.   Prior CV studies:   The following studies were reviewed today:  ------------------------------------------------------------------- ECHO 12/11/2018 Study Conclusions  - Left ventricle: The cavity size was normal. Systolic function was   mildly to moderately reduced. The estimated ejection fraction was   in the range of 40% to 45%. Akinesis of the mid-apical   anteroseptal, inferoseptal, and apical myocardium. Hypokinesis of   the anterior myocardium. Doppler parameters are consistent with   abnormal left ventricular relaxation (grade 1 diastolic   dysfunction). Doppler parameters are consistent with high   ventricular filling pressure. - Aortic valve: Transvalvular velocity was within the normal range.   There was no stenosis. There was no regurgitation. - Mitral valve: Transvalvular velocity was within the normal range.   There was no evidence for stenosis. There was mild regurgitation. - Left atrium: The atrium was  mildly dilated. - Right ventricle: The cavity size was normal. Wall thickness was   normal. Systolic function was normal. - Atrial septum: No defect or patent foramen ovale was identified   by color flow Doppler. - Tricuspid valve: There was  mild regurgitation. - Pulmonary arteries: Systolic pressure was within the normal   range. PA peak pressure: 38 mm Hg (S). - Global longitudinal strain -10.0% (abnormal).    ECHO 01/07/2020 IMPRESSIONS  1. Left ventricular ejection fraction, by visual estimation, is 25 to 30%. The left ventricle has severely decreased function. Left ventricular septal wall thickness was mildly increased. There is mildly increased left ventricular hypertrophy.  2. Left ventricular diastolic parameters are consistent with Grade III diastolic dysfunction (restrictive).  3. Moderate to severely dilated left ventricular internal cavity size.  4. The left ventricle demonstrates regional wall motion abnormalities.  5. Only basal function preserved. septal apical mid and apical inferior and anterior wall akinesis.  6. Global right ventricle has normal systolic function.The right ventricular size is normal. No increase in right ventricular wall thickness.  7. Left atrial size was mildly dilated.  8. Right atrial size was normal.  9. Moderate calcification of the mitral valve leaflet(s). 10. Moderate thickening of the mitral valve leaflet(s). 11. The mitral valve is normal in structure. Mild to moderate mitral valve regurgitation. 12. The tricuspid valve is normal in structure. 13. The aortic valve is tricuspid. Aortic valve regurgitation is not visualized. severe AV sclerosis. 14. Pulmonic regurgitation is mild. 15. The pulmonic valve was grossly normal. Pulmonic valve regurgitation is mild. 16. Mildly elevated pulmonary artery systolic pressure.   Labs/Other Tests and Data Reviewed:    EKG:  An ECG dated 01/04/2020 was personally reviewed today and demonstrated:  Normal sinus rhythm at 65 bpm.  QS complex V1 V2 with T wave abnormality V1 through V5.  Normal intervals.  No ectopy  Recent Labs: 02/05/2019: BNP 423.6 11/06/2019: TSH 1.360 01/09/2020: ALT 14; BUN 13; Creatinine 0.86; Hemoglobin 10.9; Magnesium  1.7; Platelet Count 321; Potassium 4.0; Sodium 140   Recent Lipid Panel Lab Results  Component Value Date/Time   CHOL 201 (H) 11/06/2019 10:10 AM   TRIG 147 11/06/2019 10:10 AM   TRIG 85 10/05/2006 10:22 AM   HDL 52 11/06/2019 10:10 AM   CHOLHDL 3.9 11/06/2019 10:10 AM   CHOLHDL 3 03/09/2018 09:09 AM   LDLCALC 123 (H) 11/06/2019 10:10 AM    Wt Readings from Last 3 Encounters:  01/18/20 116 lb (52.6 kg)  01/09/20 120 lb 6.4 oz (54.6 kg)  01/04/20 122 lb (55.3 kg)     Objective:    Vital Signs:  BP 117/70   Pulse 72   Ht 4' 11"  (1.499 m)   Wt 116 lb (52.6 kg)   LMP 12/21/1999   BMI 23.43 kg/m    Since this was a telemedicine evaluation I could not physically examine the patient today. Breathing appeared normal and not labored There was no wheezing She was feeling improved with her reduction in her chemotherapy dose but became sick following the initial 3000 mg twice daily dosing No chest tightness No abdominal pain No bleeding No swelling Sleeping adequately Normal affect  ASSESSMENT & PLAN:    1. CAD in native artery   2. S/P CABG (coronary artery bypass graft)   3. Metastatic adenocarcinoma ([place])   4. Nonischemic cardiomyopathy ([place])   5. Grade III diastolic dysfunction   6. Hyperlipidemia with target LDL less than 70   7. Medication management  Cassie Campbell is a 79 year-old African-American female who suffered a NSTEMI in October 2015 which was felt to be due to subtotal high-grade calcified RCA with severe diffuse distal disease beyond her subtotal stenosis.  She also has a severely diffusely diseased mid distal LAD which was not amenable for revascularization.   She was treated medically and did well until October 2019 when she developed recurrent angina consistent with unstable symptoms necessitating repeat catheterization.  She had severely calcified CAD  and unsuccessful CABG revascularization surgery.  She did well from a cardiac surgical standpoint  but unfortunately was found to have mediastinal adenopathy and has been diagnosed with metastatic adenocarcinoma of gallbladder cancer etiology.  Her echo Doppler study prior to initiating chemotherapy showed an EF of 40 to 45% with akinesis of the mid apical anteroseptal, inferoseptal, and apical myocardium with anterior hypokinesis.  She had grade 1 diastolic dysfunction and abnormal tissue Doppler consistent with high ventricular filling pressures.  Systolic peak pressure was elevated at 38 mm.  Global strain was abnormal.  She initiated chemotherapy on December 15, 2018  and tolerated her initial treatment fairly well. She is followed by Dr. Burr Medico and subsequent imaging studies had initially shown a reduction of her tumor burden.  Unfortunately on her most recent PET scan in December 2020 there was  significant tumor progression.  Prior to initiating secondary therapy with Xeloda, Herceptin and Perjeta due to her HER2  implication in her tumor her most recent 2D echo Doppler study has now shown significantly decline in LV function.  When I saw her 2 weeks ago in the office she appeared euvolemic on exam and was not having any overt CHF symptoms.  However, her echo Doppler study from January 07 2020 shows he EF now  25 to 30% with restrictive grade 3 diastolic dysfunction physiology.  She has not had any anginal symptomatology.  It is possible that this reduced LV function may be contributed by her initial round of chemotherapy.  She has continued to be on bisoprolol 10 mg daily in addition to furosemide 20 mg/day.  With her decline in LV function, I have suggested initiation of low-dose ARB therapy with losartan 25 mg.  If she is able to tolerate this and her blood pressure and renal function are stable I then plan to transition her to [facility] in approximately 3 to 4 weeks.  She did become ill following her first day of Xeloda when she received 1500 mg twice a day.  Her dose has subsequently been reduced to 500  mg twice a day for a 7-day on and 7-day off schedule.   She is not having any anginal symptomatology presently.  She denies any PND orthopnea.  I will see her in 3 to 4 weeks for reevaluation.  Prior to that office visit I would recommend a chemistry profile as well as BNP level.  COVID-19 Education: The signs and symptoms of COVID-19 were discussed with the patient and how to seek care for testing (follow up with PCP or arrange E-visit).  The importance of social distancing was discussed today.  Time:   Today, I have spent 20 minutes with the patient with telehealth technology discussing the above problems.     Medication Adjustments/Labs and Tests Ordered: Current medicines are reviewed at length with the patient today.  Concerns regarding medicines are outlined above.   Tests Ordered: No orders of the defined types were placed in this encounter.   Medication Changes: No orders of the  defined types were placed in this encounter.   Follow Up: In person, 4 weeks  Signed, Shelva Majestic, MD  01/18/2020 8:56 AM    [place]

## 2020-01-18 NOTE — Patient Instructions (Signed)
Medication Instructions:   START Losartan 25 mg daily.  *If you need a refill on your cardiac medications before your next appointment, please call your pharmacy*   Follow-Up: At [facility], you and your health needs are our priority.  As part of our continuing mission to provide you with exceptional heart care, we have created designated Provider Care Teams.  These Care Teams include your primary Cardiologist (physician) and Advanced Practice Providers (APPs -  Physician Assistants and Nurse Practitioners) who all work together to provide you with the care you need, when you need it.  Your next appointment:   4 week(s)  The format for your next appointment:   In Person  Provider:   Shelva Majestic, MD

## 2020-01-20 ENCOUNTER — Encounter: Payer: Self-pay | Admitting: Cardiovascular Disease

## 2020-01-21 ENCOUNTER — Ambulatory Visit: Payer: Medicare Other | Admitting: Nurse Practitioner

## 2020-01-21 ENCOUNTER — Other Ambulatory Visit: Payer: Medicare Other

## 2020-01-29 ENCOUNTER — Inpatient Hospital Stay (HOSPITAL_BASED_OUTPATIENT_CLINIC_OR_DEPARTMENT_OTHER): Payer: Medicare Other | Admitting: Nurse Practitioner

## 2020-01-29 ENCOUNTER — Inpatient Hospital Stay: Payer: Medicare Other | Attending: Obstetrics

## 2020-01-29 ENCOUNTER — Inpatient Hospital Stay: Payer: Medicare Other

## 2020-01-29 ENCOUNTER — Other Ambulatory Visit: Payer: Self-pay

## 2020-01-29 ENCOUNTER — Encounter: Payer: Self-pay | Admitting: Nurse Practitioner

## 2020-01-29 VITALS — BP 153/75 | HR 62 | Temp 97.8°F | Resp 18 | Wt 119.5 lb

## 2020-01-29 DIAGNOSIS — Z452 Encounter for adjustment and management of vascular access device: Secondary | ICD-10-CM | POA: Diagnosis not present

## 2020-01-29 DIAGNOSIS — E785 Hyperlipidemia, unspecified: Secondary | ICD-10-CM | POA: Insufficient documentation

## 2020-01-29 DIAGNOSIS — M8580 Other specified disorders of bone density and structure, unspecified site: Secondary | ICD-10-CM | POA: Diagnosis not present

## 2020-01-29 DIAGNOSIS — C23 Malignant neoplasm of gallbladder: Secondary | ICD-10-CM | POA: Diagnosis present

## 2020-01-29 DIAGNOSIS — I110 Hypertensive heart disease with heart failure: Secondary | ICD-10-CM | POA: Diagnosis not present

## 2020-01-29 DIAGNOSIS — C7951 Secondary malignant neoplasm of bone: Secondary | ICD-10-CM | POA: Diagnosis not present

## 2020-01-29 DIAGNOSIS — D6481 Anemia due to antineoplastic chemotherapy: Secondary | ICD-10-CM | POA: Diagnosis not present

## 2020-01-29 DIAGNOSIS — I2510 Atherosclerotic heart disease of native coronary artery without angina pectoris: Secondary | ICD-10-CM | POA: Insufficient documentation

## 2020-01-29 LAB — CBC WITH DIFFERENTIAL (CANCER CENTER ONLY)
Abs Immature Granulocytes: 0.02 10*3/uL (ref 0.00–0.07)
Basophils Absolute: 0 10*3/uL (ref 0.0–0.1)
Basophils Relative: 0 %
Eosinophils Absolute: 0.4 10*3/uL (ref 0.0–0.5)
Eosinophils Relative: 8 %
HCT: 34.9 % — ABNORMAL LOW (ref 36.0–46.0)
Hemoglobin: 11.4 g/dL — ABNORMAL LOW (ref 12.0–15.0)
Immature Granulocytes: 0 %
Lymphocytes Relative: 10 %
Lymphs Abs: 0.5 10*3/uL — ABNORMAL LOW (ref 0.7–4.0)
MCH: 31.8 pg (ref 26.0–34.0)
MCHC: 32.7 g/dL (ref 30.0–36.0)
MCV: 97.2 fL (ref 80.0–100.0)
Monocytes Absolute: 0.5 10*3/uL (ref 0.1–1.0)
Monocytes Relative: 10 %
Neutro Abs: 3.7 10*3/uL (ref 1.7–7.7)
Neutrophils Relative %: 72 %
Platelet Count: 261 10*3/uL (ref 150–400)
RBC: 3.59 MIL/uL — ABNORMAL LOW (ref 3.87–5.11)
RDW: 18.3 % — ABNORMAL HIGH (ref 11.5–15.5)
WBC Count: 5.1 10*3/uL (ref 4.0–10.5)
nRBC: 0 % (ref 0.0–0.2)

## 2020-01-29 LAB — CMP (CANCER CENTER ONLY)
ALT: 15 U/L (ref 0–44)
AST: 30 U/L (ref 15–41)
Albumin: 3.6 g/dL (ref 3.5–5.0)
Alkaline Phosphatase: 65 U/L (ref 38–126)
Anion gap: 8 (ref 5–15)
BUN: 10 mg/dL (ref 8–23)
CO2: 28 mmol/L (ref 22–32)
Calcium: 10.2 mg/dL (ref 8.9–10.3)
Chloride: 104 mmol/L (ref 98–111)
Creatinine: 0.86 mg/dL (ref 0.44–1.00)
GFR, Est AFR Am: >60 mL/min
GFR, Estimated: >60 mL/min
Glucose, Bld: 79 mg/dL (ref 70–99)
Potassium: 4.3 mmol/L (ref 3.5–5.1)
Sodium: 140 mmol/L (ref 135–145)
Total Bilirubin: 0.4 mg/dL (ref 0.3–1.2)
Total Protein: 8.4 g/dL — ABNORMAL HIGH (ref 6.5–8.1)

## 2020-01-29 LAB — MAGNESIUM: Magnesium: 1.7 mg/dL (ref 1.7–2.4)

## 2020-01-29 MED ORDER — HEPARIN SOD (PORK) LOCK FLUSH 100 UNIT/ML IV SOLN
500.0000 [IU] | Freq: Once | INTRAVENOUS | Status: AC | PRN
  Administered 2020-01-29: 500 [IU]
  Filled 2020-01-29: qty 5

## 2020-01-29 MED ORDER — SODIUM CHLORIDE 0.9% FLUSH
10.0000 mL | Freq: Once | INTRAVENOUS | Status: AC | PRN
  Administered 2020-01-29: 08:00:00 10 mL
  Filled 2020-01-29: qty 10

## 2020-01-29 NOTE — Progress Notes (Signed)
[place]   Telephone:([phone]) [phone] [phone]   Clinic Follow up Note   Patient Care Team: Tanda Rockers, MD as PCP - General (Pulmonary Disease) Troy Sine, MD as PCP - Cardiology (Cardiology) 01/29/2020  CHIEF COMPLAINT: F/u metastatic gallbladder cancer   SUMMARY OF ONCOLOGIC HISTORY: Oncology History Overview Note  Cancer Staging Gallbladder cancer [facility]) Staging form: Gallbladder, AJCC 8th Edition - Clinical stage from 11/13/2018: Stage IVB (cTX, cN2, pM1) - Signed by Truitt Merle, MD on 12/15/2018     Gallbladder cancer ([place])  10/04/2018 Pathology Results   10/04/2018 Surgical Pathology Diagnosis 1. Lymph node, biopsy, mediastinal - LYMPH NODE WITH METASTATIC ADENOCARCINOMA. - SEE MICROSCOPIC DESCRIPTION 2. Plaque, coronary artery - CALCIFIED ATHEROSCLEROTIC PLAQUE.   10/14/2018 Miscellaneous   Foundation One:  MSI stable tumor mutation burden 3Muts/mb ERBB2 amplification CCNE1 amplification TP53 mutation(+) CDK6 amplification  HGF amplification    11/13/2018 Cancer Staging   Staging form: Gallbladder, AJCC 8th Edition - Clinical stage from 11/13/2018: Stage IVB (cTX, cN2, pM1) - Signed by Truitt Merle, MD on 12/15/2018   11/17/2018 Imaging   11/17/2018 CT CAP IMPRESSION: 1. 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 This is associated with extensive upper abdominal and retroperitoneal lymphadenopathy, as well as metastatic lymphadenopathy in the posterior mediastinum and left supraclavicular region. There is also a metastatic lesion to the left ischium. 2. 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 3. Aortic atherosclerosis, in addition to left main and 3 vessel coronary artery disease. Status post median sternotomy for CABG including LIMA to the LAD. 4. There are calcifications of the aortic valve. Echocardiographic correlation for evaluation of potential valvular  dysfunction may be warranted if clinically indicated.    11/21/2018 Initial Diagnosis   Gallbladder cancer ([place])   11/27/2018 Pathology Results   11/27/2018 CA19-9 immunohistochemical stain Per request, a CA19-9 immunohistochemical stain was performed at an outside institution revealing positive staining in the tumor cells.    12/15/2018 - 11/30/2019 Chemotherapy   First line chemo cisplatin and gemcitabine every 2 weeks on 12/15/18. Had 1 month chemo break in April, restarted on 04/20/19. Stopped 11/30/19 due to disease progression.    02/20/2019 PET scan   Restaging PET scan:  IMPRESSION: 1. Positive response to therapy at all primary and metastatic sites. Persistent primary and metastatic lesions do have intense hypermetabolic activity albeit reduced. 2. Decrease in size and hypermetabolic activity of mass lesion in the gallbladder fundus. 3. Interval decrease in size and metabolic activity of periportal adenopathy. 4. Decrease in size and metabolic activity of periaortic lymph nodes. 5. Interval decrease in size and metabolic activity of destructive lesion in the LEFT inferior pubic ramus with new sclerosis. 6. No new or progressive disease.   05/29/2019 Imaging   CT CAP W Contrast  IMPRESSION: Known gallbladder adenocarcinoma, difficult to compare to recent PET, improved from prior CT.  Upper abdominal/retroperitoneal lymphadenopathy, slightly improved from prior PET.  Osseous metastasis involving the left inferior pubic ramus, grossly unchanged.  No evidence of metastatic disease in the chest.  Additional ancillary findings as above.   08/22/2019 Imaging   CT CAP W Contrast  IMPRESSION: 1. Stable soft tissue mass involving the gallbladder fundus. 2. Stable mild porta hepatis and retroperitoneal lymphadenopathy. 3. Stable sclerotic bone metastases. 4. No new or progressive metastatic disease identified. 5. Stable 2.3 cm left thyroid lobe nodule.   11/14/2019  Imaging   CT Left Hip WO  contrast  IMPRESSION: 1. Stable  chronic sclerotic metastasis involving the left ischium. Associated soft tissue components have progressed, but there is no evidence of pathologic fracture. 2. No acute osseous findings. 3. 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   12/11/2019 PET scan   IMPRESSION: 1. Significant progression of malignancy, with increased activity in the gallbladder mass; considerable increase in size and activity of porta hepatis, retroperitoneal, and upper pelvic adenopathy; significant increase in size and metabolic activity of the left ischial metastatic lesion with extensive extraosseous component; and new mildly hypermetabolic left supraclavicular and thoracic periaortic lymph nodes suspicious for malignant involvement. 2. Faintly increased activity in a left thyroid nodule which was not previously hypermetabolic. Given the relatively low-grade activity, this may be amenable to surveillance, but a significant minority of thyroid nodules with hypermetabolic activity can represent thyroid cancer. 3. Other imaging findings of potential clinical significance: Chronic paranasal sinusitis. Chronic right middle lobe atelectasis. Aortic Atherosclerosis (ICD10-I70.0). Coronary atherosclerosis with cardiomegaly.   12/17/2019 - 01/07/2020 Radiation Therapy   Palliative Radiation with Dr. Lisbeth Renshaw 12/17/19-01/07/20    Chemotherapy   PENDING oral Xeloda    01/09/2020 -  Chemotherapy   The patient had pertuzumab (PERJETA) 840 mg in sodium chloride 0.9 % 250 mL chemo infusion, 840 mg, Intravenous, Once, 0 of 5 cycles trastuzumab-anns (KANJINTI) 441 mg in sodium chloride 0.9 % 250 mL chemo infusion, 8 mg/kg = 441 mg (100 % of original dose 8 mg/kg), Intravenous,  Once, 0 of 5 cycles Dose modification: 8 mg/kg (original dose 8 mg/kg, Cycle 1, Reason: Other (see comments), Comment: Insurance requires Kanjinti), 6 mg/kg (Cycle 2, Reason: Other (see  comments), Comment: insurance requires kanjinti)  for chemotherapy treatment.      CURRENT THERAPY:  -Palliative Radiation with Dr. Lisbeth Renshaw 12/16/09-01/06/19 -Zometa q12weeks starting 01/26/19 -PENDING second line single agent oral Xeloda1 week on and 1 week off  INTERVAL HISTORY: Cassie Campbell returns for f/u as scheduled. She started Xeloda 1500 mg BID on 1/21. She felt "fine for 2-3 days" then developed nausea with vomiting, fatigue, unsteadiness and low po intake for 2 days over the weekend. She could not get through to anyone at [facility]. On 1/25 she spoke with the nurse and was instructed to reduce to 500 mg BID one week on and one week off. She tolerated this much better, without n/v/c/d, mucositis, hand/foot syndrome, fatigue, fever, chills, cough, chest pain or any other new problems. DOE at baseline is stable. Mild tingling in few fingers also stable. After radiation her left leg/hip pain resolved. Does not take pain meds or require a cane anymore. She started new cycle Xeloda 500 mg today, tolerating well.     MEDICAL HISTORY:  Past Medical History:  Diagnosis Date  . Allergic rhinitis   . Arthritis   . Asthma    xolair s 8/05 ?11/07; mastered hfa 12/20/08  . Benign positional vertigo   . CAD (coronary artery disease)    a. 09/2014 NSTEMI s/p LHC with sig 2V dz. dLAD diffusely diseased and not suitable for PCI. unsuccessful RCA PCI d/t heavy calcifications  . gallbladder ca dx'd 10/2018  . GERD (gastroesophageal reflux disease)   . Heart attack ([place])    09/2014  . Hyperlipidemia    <130 ldl pos fm hx, bp  . Hypertension   . Osteopenia    dexa 08/22/07 AP spine + 1.1, left femur -1.3, right femur -.8; dexa 10/06/09 +1.6, left femur =1.6, right femur -.  . PONV (postoperative nausea and vomiting)   . Ruptured disc, cervical   .  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 BREAST SURGERY   10-26-10   Rt. breast bx--for microcalcifications in rt. retroareolar region--dx was hyalinized fibroadenoma  . BUNIONECTOMY Bilateral   . CARDIAC CATHETERIZATION     2015  . CATARACT EXTRACTION Right 02/02/2018   Dr Satira Sark  . CATARACT EXTRACTION Left 03/30/2018  . [place] SURGERY  12/01  . CORONARY ARTERY BYPASS GRAFT N/A 10/04/2018   Procedure: CORONARY ARTERY BYPASS GRAFTING (CABG) times 2 using left  Internal mammary artery to LAD, left greater saphenous vein - open harvest.;  Surgeon: [name], MD;  Location: [place];  Service: Open Heart Surgery;  Laterality: N/A;  . IR IMAGING GUIDED PORT INSERTION  12/08/2018  . LEFT HEART CATH AND CORONARY ANGIOGRAPHY N/A 09/27/2018   Procedure: LEFT HEART CATH AND CORONARY ANGIOGRAPHY;  Surgeon: Troy Sine, MD;  Location: [place] CV LAB;  Service: Cardiovascular;  Laterality: N/A;  . LEFT HEART CATHETERIZATION WITH CORONARY ANGIOGRAM N/A 10/16/2014   Procedure: LEFT HEART CATHETERIZATION WITH CORONARY ANGIOGRAM;  Surgeon: Blane Ohara, MD;  Location[facility] CATH LAB;  Service: Cardiovascular;  Laterality: N/A;  . Left L4-5 transforaminal lumbar interbody fusion with Depuy cage, rods and screws, local and allograft bone graft, Vivigen; bilateral decompression/partial hemilaminectomy lumbar five-sacral one  07/2017  . PERCUTANEOUS CORONARY ROTOBLATOR INTERVENTION (PCI-R) N/A 10/17/2014   Procedure: PERCUTANEOUS CORONARY ROTOBLATOR INTERVENTION (PCI-R);  Surgeon: Troy Sine, MD;  Location: [facility] CATH LAB;  Service: Cardiovascular;  Laterality: N/A;  . TEE WITHOUT CARDIOVERSION N/A 10/04/2018   Procedure: TRANSESOPHAGEAL ECHOCARDIOGRAM (TEE);  Surgeon: [name], MD;  Location: [place];  Service: Open Heart Surgery;  Laterality: N/A;  . VEIN LIGATION AND STRIPPING Left   . VIDEO BRONCHOSCOPY Bilateral 02/05/2015   Procedure: VIDEO BRONCHOSCOPY WITHOUT FLUORO;  Surgeon: Tanda Rockers, MD;  Location: [facility] ENDOSCOPY;   Service: Endoscopy;  Laterality: Bilateral;  . VULVA /PERINEUM BIOPSY  12-30-10   --epidermoid cyst    I have reviewed the social history and family history with the patient and they are unchanged from previous note.  ALLERGIES:  is allergic to fish-derived products; penicillins; aspirin; brilinta [ticagrelor]; codeine phosphate; and diphenhydramine hcl.  MEDICATIONS:  Current Outpatient Medications  Medication Sig Dispense Refill  . albuterol (PROAIR HFA) 108 (90 Base) MCG/ACT inhaler INHALE 1 TO 2 PUFFS BY MOUTH EVERY 4 HOURS AS NEEDED FOR WHEEZING 8.5 g 3  . atorvastatin (LIPITOR) 80 MG tablet Take 80 mg by mouth daily.    . bisoprolol (ZEBETA) 10 MG tablet TAKE 1 TABLET(10 MG) BY MOUTH DAILY 90 tablet 1  . budesonide-formoterol (SYMBICORT) 160-4.5 MCG/ACT inhaler INHALE 2 PUFFS BY MOUTH EVERY 12 HOURS 30.6 g 3  . capecitabine (XELODA) 500 MG tablet Take 3 tablets (1,500 mg total) by mouth 2 (two) times daily after a meal. 7 days on and 7 days off 84 tablet 1  . cetirizine (ZYRTEC) 10 MG tablet Take 10 mg by mouth at bedtime as needed for allergies.    . cholecalciferol 2000 units TABS Take 1 tablet (2,000 Units total) by mouth daily. 30 tablet 3  . clopidogrel (PLAVIX) 75 MG tablet TAKE 1 TABLET(75 MG) BY MOUTH DAILY 90 tablet 3  . dextromethorphan-guaiFENesin (MUCINEX DM) 30-600 MG per 12 hr tablet Take 1-2 tablets by mouth every 12 (twelve) hours as needed for cough (with flutter).     . famotidine (  PEPCID) 20 MG tablet Take 20 mg by mouth as needed.     . ferrous sulfate 325 (65 FE) MG tablet Take 650 mg by mouth daily with breakfast.     . fluticasone (FLONASE) 50 MCG/ACT nasal spray Place 1-2 sprays into both nostrils 2 (two) times daily as needed for allergies or rhinitis.    . furosemide (LASIX) 20 MG tablet Take 1 tablet (20 mg total) by mouth daily. 180 tablet 3  . losartan (COZAAR) 25 MG tablet Take 1 tablet (25 mg total) by mouth daily. 90 tablet 2  . magic mouthwash SOLN Take  5 mLs by mouth 4 (four) times daily. Leave Benadryl out of compound since pt allergic to it. Use hydrocortisone & nystatin. Swish & swallow or spit for thrush 240 mL 1  . meclizine (ANTIVERT) 25 MG tablet TAKE 2 TABLETS BY MOUTH THREE TIMES DAILY AS NEEDED 24 tablet 2  . montelukast (SINGULAIR) 10 MG tablet TAKE 1 TABLET(10 MG) BY MOUTH AT BEDTIME 90 tablet 1  . Multiple Vitamin (MULTIVITAMIN) capsule Take 1 capsule by mouth daily.     Marland Kitchen dexamethasone (DECADRON) 4 MG tablet Take 1 tablet (4 mg total) by mouth 2 (two) times daily with a meal. (Patient taking differently: Take 4 mg by mouth 2 (two) times daily with a meal. Takes prn SOB after chemo) 20 tablet 0  . gabapentin (NEURONTIN) 100 MG capsule Take 1 capsule (100 mg total) by mouth 3 (three) times daily. Start at 117m at night first, and gradually increase to 3096mat night, and 10038mm and 100m9m over the next month 90 capsule 0  . MAGNESIUM-OXIDE 400 (241.3 Mg) MG tablet TAKE 1 TABLET(400 MG) BY MOUTH THREE TIMES DAILY 90 tablet 1  . oxyCODONE (OXY IR/ROXICODONE) 5 MG immediate release tablet Take 1 tablet (5 mg total) by mouth every 8 (eight) hours as needed for severe pain. 45 tablet 0   No current facility-administered medications for this visit.    PHYSICAL EXAMINATION: ECOG PERFORMANCE STATUS: 1 - Symptomatic but completely ambulatory  Vitals:   01/29/20 0851  BP: (!) 153/75  Pulse: 62  Resp: 18  Temp: 97.8 F (36.6 C)  SpO2: 100%   Filed Weights   01/29/20 0850  Weight: 119 lb 8 oz (54.2 kg)    GENERAL:alert, no distress and comfortable SKIN: no rash. Palms without erythema  EYES: sclera clear LUNGS: clear with normal breathing effort HEART: regular rate & rhythm, no lower extremity edema ABDOMEN: abdomen soft, non-tender and normal bowel sounds NEURO: alert & oriented x 3 with fluent speech, no focal motor/sensory deficits PAC without erythema   LABORATORY DATA:  I have reviewed the data as listed CBC Latest  Ref Rng & Units 01/29/2020 01/09/2020 12/13/2019  WBC 4.0 - 10.5 K/uL 5.1 5.8 6.5  Hemoglobin 12.0 - 15.0 g/dL 11.4(L) 10.9(L) 10.0(L)  Hematocrit 36.0 - 46.0 % 34.9(L) 34.9(L) 31.1(L)  Platelets 150 - 400 K/uL 261 321 233     CMP Latest Ref Rng & Units 01/29/2020 01/09/2020 12/13/2019  Glucose 70 - 99 mg/dL 79 99 101(H)  BUN 8 - 23 mg/dL 10 13 10   Creatinine 0.44 - 1.00 mg/dL 0.86 0.86 0.88  Sodium 135 - 145 mmol/L 140 140 139  Potassium 3.5 - 5.1 mmol/L 4.3 4.0 3.9  Chloride 98 - 111 mmol/L 104 102 100  CO2 22 - 32 mmol/L 28 27 29   Calcium 8.9 - 10.3 mg/dL 10.2 9.6 9.2  Total Protein 6.5 - 8.1  g/dL 8.4(H) 8.8(H) 8.2(H)  Total Bilirubin 0.3 - 1.2 mg/dL 0.4 0.4 0.3  Alkaline Phos 38 - 126 U/L 65 74 81  AST 15 - 41 U/L 30 33 31  ALT 0 - 44 U/L 15 14 10       RADIOGRAPHIC STUDIES: I have personally reviewed the radiological images as listed and agreed with the findings in the report. No results found.   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 -Started first line cisplatin and gemcitabine on 12/15/2018 q2 weeks due to advanced age and CHF -She is also onZometa q80monthstarting 01/26/19 -Due to worsening fatigue her chemo was dose reduced by 20% andgiven q2 weeks  -S/p cycle 13 day 1 on 12/11. She tolerated treatment well overall with intermittent fatigue and G1 CIPN, otherwise no significant toxicities.  -Unfortunately PET from 12/11/19 showed significant progression of diffuse nodal metastasis including new left supraclavicular and periaortic LN, increased activity of gallbladder mass, and increased size and metabolic activity of left ischial lesion with extensive extraosseous component. Cisplatin and gemcitabine was discontinued -she completed palliative RT and left hip/leg pain resolved  -Dr. FBurr Medicorecommended  second line single agent Xeloda 1500 mg BID for 7 days on and 7 days off.  -Although her tumor is HER2(+), her recent echo shows significant cardiomyopathy with EF 25-30% and grade III diastolic dysfunction, unfortunately she is not a candidate for herceptin/perjeta. She will f/u with cardiology. -She began Xeloda 1500 mg BID 1 week on/1 week off on 1/21, she tolerated poorly and developed n/v, weakness, and low po intake. She was instructed to dose reduce to 500 mg BID which she tolerated much better with resolution of her previous symptoms.  -Ms. BBareappears stable, she tolerated and recovered well from low-dose Xeloda 500 mg BID on 1 week on/1 week off. She started cycle 2 today at same dose.  -we reviewed symptom management should she develop recurrent symptoms. I discussed trying to increase her dose to 1000 mg AM and 500 mg PM for 1 week on and 1 week off, but she is hesitant due to her poor tolerance of higher dose. She will continue 500 mg BID this week, then a week off.  -I plan to call her 2/22 to see how she tolerated this week, if she does well, may increase to 1000 mg AM/500 mg PM if she agrees. Will see how she does. She agrees with the plan  -she will return for lab, f/u, and zometa in 3 weeks   2. Left leg pain  -she has acute on chronic left leg/hip pain and weakness. Worsened recentlywithfocal tenderness at the left ischial tuberosity.  -saw ortho Dr. NJinny Blossomand received oxycodone PRN -CT on 11/14/19 showed stable metastasis of the left ischium with progressive surrounding soft tissue -PET on 12/22 showed significant increase in size and metabolic activity of the lesion and extraosseous component  -She completed palliative RT per Dr. MLisbeth Renshawfrom 12/17/19 to 01/08/20 -pain and weakness resolved after radiation, does not currently require pain meds or assistive devices  3. G1 CIPN -Developed with cycle 11 cisplatin/gemcitabine, mild intermittent tingling to  fingertips -no functional difficulties,tuning fork exam is normal -Improved on oral B complex vitamin, continue monitoring   -stable now, only in few fingertips   4. CAD, S/P CABGX2 on 09/27/2018, EF 40-45% -Currently on Plavix. She also takes lasixjust once daily.  Recently switched PPI to pepcid due to DDI with Xeloda -Echo 01/07/20 showed markedly reduced EF to 25-30% with severely decreased left ventricular function, Grade III diastolic dysfunction -Recent f/u with Dr. Claiborne Billings who started ARB, continue f/u   5. Asthma, dyspnea -Currently on Symbicort, Singulair, and albuterol as needed -f/u with Dr. Melvyn Novas scheduled in 2/21 -baseline exertional dyspnea remains stable   6.Goal of care discussion -The patient understands the goal of care is palliative. -she is full code for now  7. Mild Anemia -Secondary to chemoand her open heart surgery; shedid not takeoral iron because it causes bloating.  -Hgb11.4 today, improved  PLAN: -Labs reviewed -Continue Xeloda 500 mg BID starting today, complete PM 2/15 then a week off -I will call her 2/22 before next week of Xeloda to see how she does, if she tolerates well may increase to 1000 mg AM and 500 mg PM for 1 week on/1 week off  -Lab and f/u in person in 3 weeks with zometa   No problem-specific Assessment & Plan notes found for this encounter.   No orders of the defined types were placed in this encounter.  All questions were answered. The patient knows to call the clinic with any problems, questions or concerns. No barriers to learning was detected.     Alla Feeling, NP 01/29/20

## 2020-01-30 ENCOUNTER — Ambulatory Visit: Payer: Medicare Other | Admitting: Cardiovascular Disease

## 2020-01-30 ENCOUNTER — Telehealth: Payer: Self-pay | Admitting: Nurse Practitioner

## 2020-01-30 NOTE — Telephone Encounter (Signed)
Scheduled appt per 2/9 los.  Spoke with pt and she is aware of the appt date and time.

## 2020-02-05 ENCOUNTER — Other Ambulatory Visit: Payer: Self-pay

## 2020-02-05 ENCOUNTER — Telehealth: Payer: Self-pay | Admitting: Radiation Oncology

## 2020-02-05 ENCOUNTER — Ambulatory Visit: Payer: Medicare Other | Admitting: Internal Medicine

## 2020-02-05 ENCOUNTER — Encounter: Payer: Self-pay | Admitting: Internal Medicine

## 2020-02-05 ENCOUNTER — Other Ambulatory Visit: Payer: Self-pay | Admitting: Hematology

## 2020-02-05 DIAGNOSIS — J4540 Moderate persistent asthma, uncomplicated: Secondary | ICD-10-CM | POA: Diagnosis not present

## 2020-02-05 DIAGNOSIS — R0600 Dyspnea, unspecified: Secondary | ICD-10-CM

## 2020-02-05 DIAGNOSIS — Z7189 Other specified counseling: Secondary | ICD-10-CM

## 2020-02-05 DIAGNOSIS — R0609 Other forms of dyspnea: Secondary | ICD-10-CM

## 2020-02-05 DIAGNOSIS — C23 Malignant neoplasm of gallbladder: Secondary | ICD-10-CM

## 2020-02-05 NOTE — Telephone Encounter (Signed)
  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  Interval Since Last Radiation:  4 weeks   12/17/2019-01/08/20: The left hip was treated to 37.5 Gy in 15 fractions.  Narrative:  The patient was contacted today for routine follow-up. During treatment she did very well with radiotherapy and did not have significant desquamation. She reports she is feeling pretty well and reports her left hip pain is resolved.  Impression/Plan: 1. 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 The patient has been doing well since completion of radiotherapy. We discussed that we would be happy to continue to follow her as needed, but she will also continue to follow up with Dr. Burr Medico in medical oncology.     Carola Rhine, PAC

## 2020-02-05 NOTE — Assessment & Plan Note (Addendum)
-  Xolair rx  07/2004 > 10/2006  - IgE  296 11/20/2013  - Prevnar rx 05/07/14  - Spirometry 06/10/2016  FEV1 1.06 (80%)  Ratio 57 - NO   06/10/2016  = 15   - IgE  06/10/16  293  - FENO 07/08/2016  =   9  - 02/05/2020  After extensive coaching inhaler device,  effectiveness =    75% (short Ti)   All goals of chronic asthma control met including optimal function and elimination of symptoms with minimal need for rescue therapy.  Contingencies discussed in full including contacting this office immediately if not controlling the symptoms using the rule of two's.

## 2020-02-05 NOTE — Patient Instructions (Addendum)
See calendar for specific medication instructions and bring it back for each and every office visit for every healthcare provider you see.  Without it,  you may not receive the best quality medical care that we feel you deserve. ° °You will note that the calendar groups together  your maintenance  medications that are timed at particular times of the day.  Think of this as your checklist for what your doctor has instructed you to do until your next evaluation to see what benefit  there is  to staying on a consistent group of medications intended to keep you well.  The other group at the bottom is entirely up to you to use as you see fit  for specific symptoms that may arise between visits that require you to treat them on an as needed basis.  Think of this as your action plan or "what if" list.  ° °Separating the top medications from the bottom group is fundamental to providing you adequate care going forward.   ° ° °Please schedule a follow up visit in 6 months but call sooner if needed  ° ° ° ° ° ° ° °

## 2020-02-05 NOTE — Assessment & Plan Note (Signed)
I spent extra time with pt today reviewing appropriate use of albuterol for prn use on exertion with the following points: 1) saba is for relief of sob that does not improve by walking a slower pace or resting but rather if the pt does not improve after trying this first. 2) If the pt is convinced, as many are, that saba helps recover from activity faster then it's easy to tell if this is the case by re-challenging : ie stop, take the inhaler, then p 5 minutes try the exact same activity (intensity of workload) that just caused the symptoms and see if they are substantially diminished or not after saba 3) if there is an activity that reproducibly causes the symptoms, try the saba 15 min before the activity on alternate days   If in fact the saba really does help, then fine to continue to use it prn but advised may need to look closer at the maintenance regimen being used to achieve better control of airways disease with exertion (? breztri instead of symbicort 160, for example, to cover any ACOS issues from remodeling from chronic asthma)    Each maintenance medication was reviewed in detail including most importantly the difference between maintenance and as needed and under what circumstances the prns are to be used. This was done in the context of a medication calendar review which provided the patient with a user-friendly unambiguous mechanism for medication administration and reconciliation and provides an action plan for all active problems. It is critical that this be shown to every doctor  for modification during the office visit if necessary so the patient can use it as a working document.          Each maintenance medication was reviewed in detail including emphasizing most importantly the difference between maintenance and prns and under what circumstances the prns are to be triggered using an action plan format where appropriate.  Total time for H and P, chart review, counseling, teaching  device and generating customized AVS unique to this office visit / charting = 30 min

## 2020-02-05 NOTE — Progress Notes (Signed)
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 Follow in pulmonary clinic also for primary care with hbp/ hyperlipidemia complicated by MI 32/99/24 and persistent mucus plug in RML/ RLL s/p fob 02/05/15 with marked improvement p lavage and no endobrchial lesions, just diffuse airway swelling      History of Present Illness   10/14/13 Cassie Campbell > laser rx for back pain  rad legs > resolved    Admit Date: 10/15/2014 Discharge date: 10/19/2014    PRIMARY DISCHARGE DIAGNOSIS:  NSTEMI (non-ST elevated myocardial infarction)   Essential hypertension  GERD (gastroesophageal reflux disease)  Chest pain  Hyperlipidemia  Hypertension  CAD (coronary artery disease)       04/11/2017  f/u ov/Cassie Campbell re: asthma/ hbp/ leg swelling with pain R foot  Chief Complaint  Patient presents with  . Follow-up    Increased cough, wheezing anfd runny nose for the past month.    no change off arnuity/ worse though with nasal symptoms x one month  Wheezing better when using the symb 160 2bid  rec Stop bisoprolol-hct and instead take bisoprolol 5 mg daily  Start lasix (furosemide) 20 mg daily Prednisone 10 mg take  4 each am x 2 days,   2 each am x 2 days,  1 each am x 2 days and stop       08/15/17 Cassie Campbell back surgery    Admit date: 09/26/2018 Discharge date: 10/09/2018  Admission Diagnoses: 1. Unstable angina ([place]) 2. History of CAD (coronary artery disease), history of NSTEMI  Discharge Diagnoses:  1. S/P CABG x 2 2. Right bundle branch block 3. ABL anemia 4. History of Hyperlipidemia  5. History of Essential hypertension 6. History of obesity 7. History of GERD (gastroesophageal reflux disease) 8. History of Ruptured disc, cervical 9. History of osteopenia 10. 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 Claiborne Billings on 09/27/2018:  Conclusion     Acute Mrg lesion is 95% stenosed.  Mid RCA to Dist RCA lesion is 100% stenosed.  Prox RCA to Mid RCA lesion is 50% stenosed.  Mid RCA lesion is 80% stenosed.  Mid LM to Dist LM lesion is 25% stenosed.  Ost LAD lesion by Dr.  is 40% stenosed.  Prox LAD-1 lesion is 95% stenosed.  Prox LAD-2 lesion is 50% stenosed.  Mid LAD-1 lesion is 90% stenosed.  Mid LAD-2 lesion is 80% stenosed.  Dist LAD lesion is 80% stenosed.  The left ventricular ejection fraction is 50-55% by visual estimate.  LV end diastolic pressure is low.  Severe coronary calcification involving the LAD, circumflex, and RCA.  There is smooth 20 to 25% narrowing in the distal left main.  The LAD has 40% proximal calcified stenosis. After the first septal perforating artery and be in the region of the first diagonal vessel the LAD is severely calcified with 95% stenosis. There is diffuse disease in the mid segment with 50% narrowing followed by 90% stenosis with distal 80% stenoses.  Calcified left circumflex vessel without high-grade obstructive disease.  Calcified RCA with 50% proximal 80% mid and total occlusion prior to the acute margin. There is extensive left-to-right collateralization to the distal RCA via the left circulation.  Low normal  global LV function with an ejection fraction at 50 to 55%. There is small focal region of mild mid anterolateral posterior basal inferior hypo-contractility. LVEDP 8 mm Hg.     Median sternotomy, extracorporeal circulation, coronary artery bypass grafting x2 (left internal mammary artery to left anterior descending with endarterectomy and saphenous vein graft to posterior descending by Dr. Roxan Hockey on 10/04/2018.  History of Presenting Illness: Cassie Campbell is a 79 yo woman with known CAD with an MI in 87. Attempted PCI-  unsuccessful, treated medically. Also has a history of hypertension, hyperlipidemia, GRED, obesity, depression, asthma, arthritis and DDD. She was in her usual state of health until Sunday when she had onset of pain in her right shoulder blade, then a profound heaviness in her chest and pain in the left neck and shoulder. Waxed and waned. Went to Cardiology on Tuesday and was admitted to the hospital. R/o for MI. Yesterday underwent cardiac catheterization which revealed severe 2 vessel CAD involving RCA and LAD. Pain free since admission.  Was on Plavix prior to admission. Vein mapping was done and she apparently had a small left thigh greater saphenous vein. After discussion with cardiology, it was decided patient would undergo CABG. Potential risks, benefits, and complications of the surgery were discussed with the patient and she agreed to proceed with surgery. Pre operative carotid duplex US showed no significant internal carotid artery stenosis bilaterally. Patient underwent a CABG x 2 with LAD endarterectomy on 10/04/2018.   Brief Hospital Course:  The patient was extubated the evening of surgery without difficulty. She had a RBBB post op which was present pre op. She remained afebrile and hemodynamically stable. Cassie Campbell, a line, chest tubes, and foley were removed early in the post operative course. Lopressor was started and titrated accordingly. She was restarted on Plavix 75 mg daily. She was volume over loaded and diuresed. She had ABL anemia. She did not require a post op transfusion. Last H and H was 10/32. She was weaned off the insulin drip.  The patient's glucose remained well controlled. The patient's HGA1C pre op was 5.9. 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 She will need further surveillance of her HGA1C with her medical doctor after discharge.  The patient was felt surgically stable for transfer from the ICU to PCTU for further convalescence . She continues to progress with cardiac rehab.  She was ambulating on room air. She has been tolerating a diet and has had a bowel movement. Epicardial pacing wires were removed on 10/08/2018. Chest tube sutures will be removed the day of discharge. The patient is felt surgically stable for discharge today.       08/13/2019  f/u ov/Cassie Campbell re: asthma/hbp/IHD met gb ca Chief Complaint  Patient presents with  . Follow-up    overall doing well- has some SOB the days she has chemo txs- has to use proair 2 x daily for a few days after and then feels better.   Dyspnea:  Uses HC parking / less energy x 4d p  Chemo then improves, really typically Not limited by breathing from desired activities   Cough: none Sleeping: well at 45 degrees  SABA use: bid  02: not  rec Plan A = Automatic = symbicort 160 Take 2 puffs first thing in am and then another 2 puffs about 12 hours later.  Work on inhaler technique:  Plan B = Backup Only use your albuterol inhaler as a rescue medication Plan C = Contingency: if needing more albuterol than usual >  Prednisone 10 mg take  4 each am x 2 days,   2 each am x 2 days,  1 each am x 2 days and stop    02/05/2020  f/u ov/Cassie Campbell re: asthma/ hbp s/p first covid 01/26/20 pfizer rx tol well  Chief Complaint  Patient presents with  . Follow-up    Breathing is unchanged since the last visit. She has not used her proair in the past month.   Dyspnea:  50 ft to street and has to sit  down on  return, never pre challenges Cough: none unless nose dripping mostly daytime, watery in nature  Sleeping: 10-20  Degrees HOB  SABA use: very rarely, better hfa today 02: none    No obvious day to day or daytime variability or assoc excess/ purulent sputum or mucus plugs or hemoptysis or cp or chest tightness, subjective wheeze or overt  b symptoms.   Sleeping as above without nocturnal  or early am exacerbation  of respiratory  c/o's or need for noct saba. Also denies any obvious fluctuation of symptoms with weather or environmental  changes or other aggravating or alleviating factors except as outlined above   No unusual exposure hx or h/o childhood pna/ asthma or knowledge of premature birth.  Current Allergies, Complete Past Medical History, Past Surgical History, Family History, and Social History were reviewed in [employer] record.  ROS  The following are not active complaints unless bolded Hoarseness, sore throat, dysphagia, dental problems, itching, sneezing,  nasal congestion or discharge of excess mucus or purulent secretions, ear ache,   fever, chills, sweats, unintended wt loss or wt gain, classically pleuritic or exertional cp,  orthopnea pnd or arm/hand swelling  or leg swelling, presyncope, palpitations, abdominal pain, anorexia, nausea, vomiting, diarrhea  or change in bowel habits or change in bladder habits, change in stools or change in urine, dysuria, hematuria,  rash, arthralgias, visual complaints, headache, numbness, weakness or ataxia or problems with walking or coordination,  change in mood or  memory.        Current Meds  Medication Sig  . albuterol (PROAIR HFA) 108 (90 Base) MCG/ACT inhaler INHALE 1 TO 2 PUFFS BY MOUTH EVERY 4 HOURS AS NEEDED FOR WHEEZING  . atorvastatin (LIPITOR) 80 MG tablet Take 40 mg by mouth daily.   . bisoprolol (ZEBETA) 10 MG tablet TAKE 1 TABLET(10 MG) BY MOUTH DAILY  . budesonide-formoterol (SYMBICORT) 160-4.5 MCG/ACT inhaler INHALE 2 PUFFS BY MOUTH EVERY 12 HOURS  . capecitabine (XELODA) 500 MG tablet Take 3 tablets (1,500 mg total) by mouth 2 (two) times daily after a meal. 7 days on and 7 days off  . cetirizine (ZYRTEC) 10 MG tablet Take 10 mg by mouth at bedtime as needed for allergies.  . cholecalciferol 2000 units TABS Take 1 tablet (2,000 Units total) by mouth daily.  . clopidogrel (PLAVIX) 75 MG tablet TAKE 1 TABLET(75 MG) BY MOUTH DAILY  . dexamethasone (DECADRON) 4 MG tablet Take 1 tablet (4 mg total) by mouth 2 (two) times daily with a  meal. (Patient taking differently: Take 4 mg by mouth 2 (two) times daily with a meal. Takes prn SOB after chemo)  . dextromethorphan-guaiFENesin (MUCINEX DM) 30-600 MG per 12 hr tablet Take 1-2 tablets by mouth every 12 (twelve) hours as needed for cough (with flutter).   . famotidine (PEPCID) 20 MG tablet Take 20 mg by mouth as needed.   . ferrous sulfate 325 (65 FE) MG tablet Take 650 mg  by mouth daily with breakfast.   . fluticasone (FLONASE) 50 MCG/ACT nasal spray Place 1-2 sprays into both nostrils 2 (two) times daily as needed for allergies or rhinitis.  Marland Kitchen gabapentin (NEURONTIN) 100 MG capsule Take 1 capsule (100 mg total) by mouth 3 (three) times daily. Start at 18m at night first, and gradually increase to 3076mat night, and 1001mm and 100m55m over the next month  . losartan (COZAAR) 25 MG tablet Take 1 tablet (25 mg total) by mouth daily.  . magic mouthwash SOLN Take 5 mLs by mouth 4 (four) times daily. Leave Benadryl out of compound since pt allergic to it. Use hydrocortisone & nystatin. Swish & swallow or spit for thrush  . MAGNESIUM-OXIDE 400 (241.3 Mg) MG tablet TAKE 1 TABLET(400 MG) BY MOUTH THREE TIMES DAILY  . meclizine (ANTIVERT) 25 MG tablet TAKE 2 TABLETS BY MOUTH THREE TIMES DAILY AS NEEDED  . montelukast (SINGULAIR) 10 MG tablet TAKE 1 TABLET(10 MG) BY MOUTH AT BEDTIME  . Multiple Vitamin (MULTIVITAMIN) capsule Take 1 capsule by mouth daily.   . oxMarland KitchenCODONE (OXY IR/ROXICODONE) 5 MG immediate release tablet Take 1 tablet (5 mg total) by mouth every 8 (eight) hours as needed for severe pain.                 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)  OSTEOPENIA (ICD-733.90)  - DEXA 08/22/07 AP spine + 1.1, left femur -1.3, right femur -.8  - DEXA 10/06/09 + 1.6 left femur -1.6, right femur -.5   -DEXA 2014 no change  HYPERTENSION (ICD-401.9)  HYPERLIPIDEMIA (ICD-272.4)  - Target < 130 ldl pos fm hx, hbp  OBESITY  - Target wt = 178 for BMI < 30   ASTHMA (ICD-493.90) ([name] not active)  -Xolair s 8/05 > 10/2006  -Mastered HFA December 30, 2008  ALLERGIC RHINITIS (ICD-477.9)  S/p cervical fusion 2000 ............................................................................  [place]  - Td 05/20/2015  - Pneumovax 09/2003 and October 27, 2009  Prevnar 05/07/2014  - Colonscopy 10/28/2004  - CPX 11/19/13     - GYN per [name]'s office - Mammogram 2013 , 2014         Objective:   Physical Exam  Pleasant amb thin bf nad   02/05/2020   117  08/13/2019   126  Wt 143 11/09/2011 >  04/12/2012  140 > 06/26/2012  139 >  11/13/2012 135 >  137 02/12/2013 >141 05/16/2013 > 07/31/2013  139 >  11/19/2013  134 >135 02/18/2014 > 04/16/2014 134 > 05/07/2014  134 >131 08/06/2014 > 10/30/2014 130 > 11/28/2014 128 > 02/18/2015   133 > 05/20/2015 133 > 07/14/2015   135 >137 08/26/2015 > 11/25/2015 143 > 12/29/2015  137 > 02/23/2016   131 > 06/10/2016   148 > 07/08/2016   148 > 10/06/2016 146 > 04/11/2017  140 >  11/24/2017  136 > 06/08/2018  96% > 11/27/2018  121 >  04/10/2019  121    Vital signs reviewed  02/05/2020  - Note at rest 02 sats  100% on RA     HEENT : pt wearing mask not removed for exam due to covid -19 concerns.    NECK :  without JVD/Nodes/TM/ nl carotid upstrokes bilaterally   LUNGS: no acc muscle use,  Nl contour chest with trace late exp rhonchi bilaterally without cough on insp or exp maneuvers   CV:  RRR  no s3 or murmur or increase in P2, and no  edema   ABD:  soft and nontender with nl inspiratory excursion in the supine position. No bruits or organomegaly appreciated, bowel sounds nl  MS:  Nl gait/ ext warm without deformities, calf tenderness, cyanosis or clubbing No obvious joint restrictions   SKIN: warm and dry without lesions    NEURO:  alert, approp, nl sensorium with  no motor or cerebellar deficits apparent.           Assessment & Plan:

## 2020-02-06 ENCOUNTER — Other Ambulatory Visit: Payer: Self-pay

## 2020-02-06 DIAGNOSIS — Z95828 Presence of other vascular implants and grafts: Secondary | ICD-10-CM

## 2020-02-06 DIAGNOSIS — C23 Malignant neoplasm of gallbladder: Secondary | ICD-10-CM

## 2020-02-06 MED ORDER — ONDANSETRON HCL 8 MG PO TABS: 8.0000 mg | ORAL_TABLET | Freq: Three times a day (TID) | ORAL | 1 refills | Status: AC | PRN

## 2020-02-06 MED ORDER — LIDOCAINE-PRILOCAINE 2.5-2.5 % EX CREA: TOPICAL_CREAM | Freq: Once | CUTANEOUS | Status: DC

## 2020-02-08 ENCOUNTER — Other Ambulatory Visit: Payer: Self-pay | Admitting: Hematology

## 2020-02-08 DIAGNOSIS — C23 Malignant neoplasm of gallbladder: Secondary | ICD-10-CM

## 2020-02-08 DIAGNOSIS — Z7189 Other specified counseling: Secondary | ICD-10-CM

## 2020-02-12 ENCOUNTER — Telehealth: Payer: Self-pay | Admitting: Nurse Practitioner

## 2020-02-12 NOTE — Telephone Encounter (Signed)
I called Cassie Campbell to see how she is doing on Xeloda. She was seen 2/9 and started 500 mg BID 1 week on and 1 week off. She did well. She started a new week today taking 1000 mg AM and 500 mg PM which she will do for a week. So far now side effects or problems. She feels well. She knows to call if she experiences worsening side effects including severer fatigue, decreased po, hand/foot syndrome, mucositis, or diarrhea. She has f/u scheduled next week. She understands and appreciates the call.   Cira Rue, NP

## 2020-02-13 ENCOUNTER — Other Ambulatory Visit: Payer: Self-pay | Admitting: Hematology

## 2020-02-13 NOTE — Telephone Encounter (Signed)
Medication refill

## 2020-02-14 NOTE — Progress Notes (Signed)
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 Lymph node, biopsy, mediastinal - LYMPH NODE WITH METASTATIC ADENOCARCINOMA. - SEE MICROSCOPIC DESCRIPTION 2. Plaque, coronary artery - CALCIFIED ATHEROSCLEROTIC PLAQUE.   10/14/2018 Miscellaneous   Foundation One:  MSI stable tumor mutation burden 3Muts/mb ERBB2 amplification CCNE1 amplification TP53 mutation(+) CDK6 amplification  HGF amplification    11/13/2018 Cancer Staging   Staging form: Gallbladder, AJCC 8th Edition - Clinical stage from 11/13/2018: Stage IVB (cTX, cN2, pM1) - Signed by Truitt Merle, MD on 12/15/2018   11/17/2018 Imaging   11/17/2018 CT CAP IMPRESSION: 1. 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 This is associated with extensive upper abdominal and retroperitoneal lymphadenopathy, as well as metastatic lymphadenopathy in the posterior mediastinum and left supraclavicular region. There is also a metastatic lesion to the left ischium. 2. 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 3. Aortic atherosclerosis, in addition to left main and 3 vessel coronary artery disease. Status post median sternotomy for CABG including LIMA to the LAD. 4. There are calcifications of the aortic valve. Echocardiographic correlation for evaluation  of potential valvular dysfunction may be warranted if clinically indicated.    11/21/2018 Initial Diagnosis   Gallbladder cancer ([place])   11/27/2018 Pathology Results   11/27/2018 CA19-9 immunohistochemical stain Per request, a CA19-9 immunohistochemical stain was performed at an outside institution revealing positive staining in the tumor cells.    12/15/2018 - 11/30/2019 Chemotherapy   First line chemo cisplatin and gemcitabine every 2 weeks on 12/15/18. Had 1 month chemo break in April, restarted on 04/20/19. Stopped 11/30/19 due to disease progression.    02/20/2019 PET scan   Restaging PET scan:  IMPRESSION: 1. Positive response to therapy at all primary and metastatic sites. Persistent primary and metastatic lesions do have intense hypermetabolic activity albeit reduced. 2. Decrease in size and hypermetabolic activity of mass lesion in the gallbladder fundus. 3. Interval decrease in size and metabolic activity of periportal adenopathy. 4. Decrease in size and metabolic activity of periaortic lymph nodes. 5. Interval decrease in size and metabolic activity of destructive lesion in the LEFT inferior pubic ramus with new sclerosis. 6. No new or progressive disease.   05/29/2019 Imaging   CT CAP W Contrast  IMPRESSION: Known gallbladder adenocarcinoma, difficult to compare to recent PET, improved from prior CT.  Upper abdominal/retroperitoneal lymphadenopathy, slightly improved from prior PET.  Osseous metastasis involving the left inferior pubic ramus, grossly unchanged.  No evidence of metastatic disease in the chest.  Additional ancillary findings as above.   08/22/2019 Imaging   CT CAP W Contrast  IMPRESSION: 1. Stable soft tissue mass involving the gallbladder fundus. 2. Stable mild porta hepatis and retroperitoneal lymphadenopathy. 3. Stable sclerotic bone metastases. 4. No new or progressive metastatic disease identified. 5. Stable 2.3 cm left thyroid lobe  nodule.   11/14/2019 Imaging   CT Left Hip WO  contrast  IMPRESSION: 1. 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 Associated soft tissue components have progressed, but there is no evidence of pathologic fracture. 2. No acute osseous findings. 3. 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   12/11/2019 PET scan   IMPRESSION: 1. Significant progression of malignancy, with increased activity in the gallbladder mass; considerable increase in size and activity of porta hepatis, retroperitoneal, and upper pelvic adenopathy; significant increase in size and metabolic activity of the left ischial metastatic lesion with extensive extraosseous component; and new mildly hypermetabolic left supraclavicular and thoracic periaortic lymph nodes suspicious for malignant involvement. 2. Faintly increased activity in a left thyroid nodule which was not previously hypermetabolic. Given the relatively low-grade activity, this may be amenable to surveillance, but a significant minority of thyroid nodules with hypermetabolic activity can represent thyroid cancer. 3. Other imaging findings of potential clinical significance: Chronic paranasal sinusitis. Chronic right middle lobe atelectasis. Aortic Atherosclerosis (ICD10-I70.0). Coronary atherosclerosis with cardiomegaly.   12/17/2019 - 01/07/2020 Radiation Therapy   Palliative Radiation with Dr. Lisbeth Renshaw 12/17/19-01/07/20   01/10/2020 -  Chemotherapy   Xeloda 1500 mg BID 1 week on/1 week off starting on 01/10/20, she tolerated poorly and developed n/v, weakness, and low po intake. She was instructed to dose reduce to 500 mg BID which she continued into C2.       CURRENT THERAPY:  -Zometa q4month starting 01/26/19 -Xeloda 1500 mg BID 1 week on/1 week off starting on 01/10/20, she tolerated poorly and developed n/v, weakness, and low po intake. She was instructed to dose reduce to 500 mg BID which she continued into C2. Increased  to 1003mAm and 50070mM on 02/12/20 and increased to 1000m73mD starting 02/26/20.   INTERVAL HISTORY:  Cassie KUNKLERhere for a follow up and treatment. She presents to the clinic alone. She notes she has been trying to maintain her weight but it has been trending down still. She notes she has lost 3-4 pounds overall since being on Xeloda. She notes she has been on 500mg37m for 1-2 weeks and increased to 1000mg 41mhe AM and 500mg i71me PM last week. She notes she was able to tolerate with no BM issues. She notes she has stomach ache postprandial which is tolerable. She will take Tums for help. She will take Pepcid only when needed.  She notes on 2/27 she took COVID19 vaccine. She had fatigue and nausea.     REVIEW OF SYSTEMS:   Constitutional: Denies fevers, chills or abnormal weight loss Eyes: Denies blurriness of vision Ears, nose, mouth, throat, and face: Denies mucositis or sore throat Respiratory: Denies cough, dyspnea or wheezes Cardiovascular: Denies palpitation, chest discomfort or lower extremity swelling Gastrointestinal:  Denies nausea, heartburn or change in bowel habits (+) Post prandial stomach ache  Skin: Denies abnormal skin rashes Lymphatics: Denies new lymphadenopathy or easy bruising Neurological:Denies numbness, tingling or new weaknesses Behavioral/Psych: Mood is stable, no new changes  All other systems were reviewed with the patient and are negative.  MEDICAL HISTORY:  Past Medical History:  Diagnosis Date   Allergic rhinitis    Arthritis    Asthma    xolair s 8/05 ?11/07; mastered hfa 12/20/08   Benign positional vertigo    CAD (coronary artery disease)    a. 09/2014 NSTEMI s/p LHC with sig 2V dz. dLAD diffusely diseased and not suitable for PCI. unsuccessful RCA PCI d/t heavy calcifications   gallbladder ca dx'd 10/2018   GERD (gastroesophageal  reflux disease)    Heart attack ([place])    09/2014   Hyperlipidemia    <130 ldl pos fm hx, bp    Hypertension    Osteopenia    dexa 08/22/07 AP spine + 1.1, left femur -1.3, right femur -.8; dexa 10/06/09 +1.6, left femur =1.6, right femur -.   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 breast bx--for microcalcifications in rt. retroareolar region--dx was hyalinized fibroadenoma   BUNIONECTOMY Bilateral    CARDIAC CATHETERIZATION     2015   CATARACT EXTRACTION Right 02/02/2018   Dr Satira Sark   CATARACT EXTRACTION Left 03/30/2018   CERVICAL [place] SURGERY  12/01   CORONARY ARTERY BYPASS GRAFT N/A 10/04/2018   Procedure: CORONARY ARTERY BYPASS GRAFTING (CABG) times 2 using left  Internal mammary artery to LAD, left greater saphenous vein - open harvest.;  Surgeon: [name], MD;  Location: [facility];  Service: Open Heart Surgery;  Laterality: N/A;   IR IMAGING GUIDED PORT INSERTION  12/08/2018   LEFT HEART CATH AND CORONARY ANGIOGRAPHY N/A 09/27/2018   Procedure: LEFT HEART CATH AND CORONARY ANGIOGRAPHY;  Surgeon: Troy Sine, MD;  Location: [place] [facility];  Service: Cardiovascular;  Laterality: N/A;   LEFT HEART CATHETERIZATION WITH CORONARY ANGIOGRAM N/A 10/16/2014   Procedure: LEFT HEART CATHETERIZATION WITH CORONARY ANGIOGRAM;  Surgeon: Blane Ohara, MD;  Location[facility] CATH LAB;  Service: Cardiovascular;  Laterality: N/A;   Left L4-5 transforaminal lumbar interbody fusion with Depuy cage, rods and screws, local and allograft bone graft, Vivigen; bilateral decompression/partial hemilaminectomy lumbar five-sacral one  07/2017   PERCUTANEOUS CORONARY ROTOBLATOR INTERVENTION (PCI-R) N/A 10/17/2014   Procedure: PERCUTANEOUS CORONARY ROTOBLATOR INTERVENTION (PCI-R);  Surgeon: Troy Sine, MD;  Location: [facility] CATH LAB;  Service: Cardiovascular;  Laterality: N/A;    TEE WITHOUT CARDIOVERSION N/A 10/04/2018   Procedure: TRANSESOPHAGEAL ECHOCARDIOGRAM (TEE);  Surgeon: [name], MD;  Location: [place];  Service: Open Heart Surgery;  Laterality: N/A;   VEIN LIGATION AND STRIPPING Left    VIDEO BRONCHOSCOPY Bilateral 02/05/2015   Procedure: VIDEO BRONCHOSCOPY WITHOUT FLUORO;  Surgeon: Tanda Rockers, MD;  Location: [facility] ENDOS[facility];  Service: Endoscopy;  Laterality: Bilateral;   VULVA /PERINEUM BIOPSY  12-30-10   --epidermoid cyst    I have reviewed the social history and family history with the patient and they are unchanged from previous note.  ALLERGIES:  is allergic to fish-derived products; penicillins; aspirin; brilinta [ticagrelor]; codeine phosphate; and diphenhydramine hcl.  MEDICATIONS:  Current Outpatient Medications  Medication Sig Dispense Refill   albuterol (PROAIR HFA) 108 (90 Base) MCG/ACT inhaler INHALE 1 TO 2 PUFFS BY MOUTH EVERY 4 HOURS AS NEEDED FOR WHEEZING 8.5 g 3   atorvastatin (LIPITOR) 40 MG tablet Take 40 mg by mouth daily.     bisoprolol (ZEBETA) 10 MG tablet TAKE 1 TABLET(10 MG) BY MOUTH DAILY 90 tablet 1   budesonide-formoterol (SYMBICORT) 160-4.5 MCG/ACT inhaler INHALE 2 PUFFS BY MOUTH EVERY 12 HOURS 30.6 g 3   capecitabine (XELODA) 500 MG tablet Take 3 tablets (1,500 mg total) by mouth 2 (two) times daily after a meal. 7 days on and 7 days off 84 tablet 1   cetirizine (ZYRTEC) 10 MG tablet Take 10 mg by mouth at bedtime as needed  for allergies.     cholecalciferol 2000 units TABS Take 1 tablet (2,000 Units total) by mouth daily. 30 tablet 3   clopidogrel (PLAVIX) 75 MG tablet TAKE 1 TABLET(75 MG) BY MOUTH DAILY 90 tablet 3   dextromethorphan-guaiFENesin (MUCINEX DM) 30-600 MG per 12 hr tablet Take 1-2 tablets by mouth every 12 (twelve) hours as needed for cough (with flutter).      famotidine (PEPCID) 20 MG tablet Take 20 mg by mouth as needed.      ferrous sulfate 325 (65 FE) MG tablet Take 650 mg by mouth  daily with breakfast.      fluticasone (FLONASE) 50 MCG/ACT nasal spray Place 1-2 sprays into both nostrils 2 (two) times daily as needed for allergies or rhinitis.     lidocaine-prilocaine (EMLA) cream APPLY EXTERNALLY TO THE AFFECTED AREA ONCE DAILY AS DIRECTED 30 g 3   magic mouthwash SOLN Take 5 mLs by mouth 4 (four) times daily. Leave Benadryl out of compound since pt allergic to it. Use hydrocortisone & nystatin. Swish & swallow or spit for thrush 240 mL 1   MAGNESIUM-OXIDE 400 (241.3 Mg) MG tablet TAKE 1 TABLET(400 MG) BY MOUTH THREE TIMES DAILY 90 tablet 1   meclizine (ANTIVERT) 25 MG tablet TAKE 2 TABLETS BY MOUTH THREE TIMES DAILY AS NEEDED 24 tablet 2   montelukast (SINGULAIR) 10 MG tablet TAKE 1 TABLET(10 MG) BY MOUTH AT BEDTIME 90 tablet 1   Multiple Vitamin (MULTIVITAMIN) capsule Take 1 capsule by mouth daily.      ondansetron (ZOFRAN) 8 MG tablet Take 1 tablet (8 mg total) by mouth every 8 (eight) hours as needed for nausea or vomiting. 30 tablet 1   oxyCODONE (OXY IR/ROXICODONE) 5 MG immediate release tablet Take 1 tablet (5 mg total) by mouth every 8 (eight) hours as needed for severe pain. 45 tablet 0   sacubitril-valsartan (ENTRESTO) 24-26 MG Take 1 tablet by mouth 2 (two) times daily.     dexamethasone (DECADRON) 4 MG tablet Take 1 tablet (4 mg total) by mouth 2 (two) times daily with a meal. (Patient not taking: Reported on 02/18/2020) 20 tablet 0   gabapentin (NEURONTIN) 100 MG capsule Take 1 capsule (100 mg total) by mouth 3 (three) times daily. Start at 192m at night first, and gradually increase to 3067mat night, and 10033mm and 100m67m over the next month (Patient not taking: Reported on 02/18/2020) 90 capsule 0   No current facility-administered medications for this visit.    PHYSICAL EXAMINATION: ECOG PERFORMANCE STATUS: 2 - Symptomatic, <50% confined to bed  Vitals:   02/18/20 0827 02/18/20 0829  BP: (!) 92/52 105/60  Pulse: 79   Resp: 19   Temp: 97.8  F (36.6 C)   SpO2: 99%    Filed Weights   02/18/20 0827  Weight: 116 lb 4.8 oz (52.8 kg)    Due to COVID19 we will limit examination to appearance. Patient had no complaints.  GENERAL:alert, no distress and comfortable SKIN: skin color normal, no rashes or significant lesions EYES: normal, Conjunctiva are pink and non-injected, sclera clear  NEURO: alert & oriented x 3 with fluent speech   LABORATORY DATA:  I have reviewed the data as listed CBC Latest Ref Rng & Units 02/18/2020 01/29/2020 01/09/2020  WBC 4.0 - 10.5 K/uL 4.0 5.1 5.8  Hemoglobin 12.0 - 15.0 g/dL 12.4 11.4(L) 10.9(L)  Hematocrit 36.0 - 46.0 % 38.0 34.9(L) 34.9(L)  Platelets 150 - 400 K/uL 304 261 321  CMP Latest Ref Rng & Units 02/18/2020 01/29/2020 01/09/2020  Glucose 70 - 99 mg/dL 120(H) 79 99  BUN 8 - 23 mg/dL _0 Creatinine 0.44 - 1.00 mg/dL 1.53(H) 0.86 0.86  Sodium 135 - 145 mmol/L 137 140 140  Potassium 3.5 - 5.1 mmol/L 3.8 4.3 4.0  Chloride 98 - 111 mmol/L 102 104 102  CO2 22 - 32 mmol/L _1 Calcium 8.9 - 10.3 mg/dL 9.2 10.2 9.6  Total Protein 6.5 - 8.1 g/dL 8.7(H) 8.4(H) 8.8(H)  Total Bilirubin 0.3 - 1.2 mg/dL 0.6 0.4 0.4  Alkaline Phos 38 - 126 U/L 58 65 74  AST 15 - 41 U/L 24 30 33  ALT 0 - 44 U/L _2 RADIOGRAPHIC STUDIES: I have personally reviewed the radiological images as listed and agreed with the findings in the report. No results found.   ASSESSMENT & PLAN:  Cassie Campbell is a 79 y.o. 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  -She is also onZometa q46monthstarting 01/26/19 -UnfortunatelyPET from 12/22/20showedsignificant progression of diffuse nodal metastasis, increased activity of  gallbladder mass, and increased size and metabolic activity of left ischial lesion. Cisplatin and gemcitabine was discontinued.  -She began Xeloda 1500 mg BID 1 week on/1 week off on 1/21, she tolerated poorly and developed n/v, weakness, and low po intake. She was instructed to dose reduce to 500 mg BID which she tolerated much better with resolution of her previous symptoms.  -She was able to increase to 10010min the AM and 50047mn the PM last week (02/12/20). She tolerated moderately well with post prandial stomach aches and no BM issues. She does not most side effects are from her second COVID19 vaccine.  -Labs reviewed and Ca 19.9 and Mag still pending. Overall adequate to proceed with next cycle Xeloda and increase to 1000m42mD starting 3/9.  -I discussed if her tumor marker continues to increase significantly will scan her sooner (in a month). I reviewed with her significant cardiomyopathy (EF 25-30%), she is not a candidate for Herceptin.  -Proceed with Zometa today.  -F/u in 2-3 weeks before cycle 4   2.Left upper leg pain -she has left hip surgeries in the past.  -Her CT left hip form 11/14/19 shows stable metastasis involving left ischium and progressed soft tissue around it. I discussed her pain is likely related to her cancer.  -PET on 12/22 showed significant increase in size and metabolic activity of the lesion and extraosseous component  -For her Pain she completedpalliative RT per Dr. MoodDanley Danker28/20 to 01/08/20 -Pain and weakness resolved after radiation, does not currently require oxycodone anymore and uses cane still.    3. CAD, S/P CABGX2 on 09/27/2018, EF 25-30% -Currently on Plavix.  -f/u with Dr. KellClaiborne Billingscent ECHO showed worsening EF  4. Asthma, dyspnea -Currently on Symbicort, Singulair, and albuterol as needed -f/u with pulmonary, stable, no recent flare -She has recently beenlittle more dyspneic. She can use dexa after chemo, and as needed for  worsening wheezing  5.Goal of care discussion -The patient understands the goal of care is palliative. -she is full code for now  6. Mild Anemia -Secondary to chemoand her recent open heart surgery -I discussed her  anemia may contribute to her mild SOB. -She has not taken oral iron because it causes bloating. I encouraged her to take prenatal vitamin. -Anemia currently resolved on lower dose Xeloda.   7. Weight loss -Her weight continues to trend down on Xeloda  -I discussed using high calorie and high protein diet to maintain weight. I offered her dietician referral, she declined today.  -She does have postprandial stomach aches, otherwise no other pain. If more persistent I suggest she use her Pepcid more regularly.    Plan -Labs reviewed, will proceed with Zometa today  -continue Xeloda and increase next cycle to 1084m BID starting 02/26/20 -Lab, flush, f/u in 2-3 weeks.      No problem-specific Assessment & Plan notes found for this encounter.   No orders of the defined types were placed in this encounter.  All questions were answered. The patient knows to call the clinic with any problems, questions or concerns. No barriers to learning was detected. The total time spent in the appointment was 30 minutes.     YTruitt Merle MD 02/18/2020   I, AJoslyn Devon am acting as scribe for YTruitt Merle MD.   I have reviewed the above documentation for accuracy and completeness, and I agree with the above.

## 2020-02-15 ENCOUNTER — Ambulatory Visit: Payer: Medicare Other | Admitting: Cardiovascular Disease

## 2020-02-15 ENCOUNTER — Other Ambulatory Visit: Payer: Self-pay

## 2020-02-15 VITALS — BP 109/63 | HR 66 | Temp 96.9°F | Ht 59.0 in | Wt 117.0 lb

## 2020-02-15 DIAGNOSIS — I2510 Atherosclerotic heart disease of native coronary artery without angina pectoris: Secondary | ICD-10-CM | POA: Diagnosis not present

## 2020-02-15 DIAGNOSIS — C799 Secondary malignant neoplasm of unspecified site: Secondary | ICD-10-CM

## 2020-02-15 DIAGNOSIS — Z951 Presence of aortocoronary bypass graft: Secondary | ICD-10-CM

## 2020-02-15 DIAGNOSIS — I255 Ischemic cardiomyopathy: Secondary | ICD-10-CM | POA: Diagnosis not present

## 2020-02-15 DIAGNOSIS — I5189 Other ill-defined heart diseases: Secondary | ICD-10-CM

## 2020-02-15 DIAGNOSIS — I519 Heart disease, unspecified: Secondary | ICD-10-CM | POA: Diagnosis not present

## 2020-02-15 DIAGNOSIS — Z79899 Other long term (current) drug therapy: Secondary | ICD-10-CM

## 2020-02-15 NOTE — Patient Instructions (Addendum)
Medication Instructions:  STOP TAKING LASIX STOP TAKING LOSARTAN TOMORROW (02/16/20) BEGIN TAKING ENTRESTO 24-26 1 TABLET TWICE A DAY samples given  *If you need a refill on your cardiac medications before your next appointment, please call your pharmacy*   Lab Work: BMET PRO BNP If you have labs (blood work) drawn today and your tests are completely normal, you will receive your results only by: Marland Kitchen MyChart Message (if you have MyChart) OR . A paper copy in the mail If you have any lab test that is abnormal or we need to change your treatment, we will call you to review the results.  Follow-Up: At [facility], you and your health needs are our priority.  As part of our continuing mission to provide you with exceptional heart care, we have created designated Provider Care Teams.  These Care Teams include your primary Cardiologist (physician) and Advanced Practice Providers (APPs -  Physician Assistants and Nurse Practitioners) who all work together to provide you with the care you need, when you need it.  We recommend signing up for the patient portal called "MyChart".  Sign up information is provided on this After Visit Summary.  MyChart is used to connect with patients for Virtual Visits (Telemedicine).  Patients are able to view lab/test results, encounter notes, upcoming appointments, etc.  Non-urgent messages can be sent to your provider as well.   To learn more about what you can do with MyChart, go to [email or web address].    Your next appointment:   2 month(s)  The format for your next appointment:   In Person  Provider:   Shelva Majestic, MD   Other Instructions [place]

## 2020-02-17 ENCOUNTER — Encounter: Payer: Self-pay | Admitting: Cardiovascular Disease

## 2020-02-17 NOTE — Progress Notes (Signed)
Patient ID: [name], female   DOB: 1941-07-24, 79 y.o.   MRN: [id number]     HPI: Cassie Campbell is a 79 y.o. female who presents for a 2 month follow-up cardiology evaluation higher to initiating chemotherapy.  Cassie Campbell has a history of complex CAD and underwent catheterization by Dr. Fletcher Anon with class IV symptoms on 10/16/2014.  She was found to have diffusely diseased LAD, which was not suitable for revascularization.  She had ruled in for non-ST segment elevation myocardial infarction, felt to be due to a severely calcified almost subtotal RCA with severe diffuse calcified disease.  Initial attempt at PCI by Dr. Fletcher Anon was unsuccessful due to the severe calcification.  She was brought back to the laboratory the following day, and I performed a very long attempt at high-speed rotational atherectomy.  Unfortunately, due to the severely stenosed calcified lesion above the acute margin the Roto floppy wire was never be advanced distal enough due to severe disease beyond this site to allow the burr to be inserted to reach the stenosis.  Multiple attempts were made to utilize wire transfer, but even the smallest catheter was never able to pass the stenosis to allow for the Roto floppy wire to be reinserted in exchange for a Fielder XT wire, which was able to cross the stenosis and advanced distally.  Consequently, the procedure was aborted.  She was sent home on increased medication regimen with potential plans for possible follow-up evaluation depending upon symptom status.  Since hospital discharge, she has not experienced any significant recurrent anginal chest pain.  During the hospitalization ranolazine was added initially at 500 mg twice a day to isosorbide mononitrate, amlodipine, and beta blocker therapy.  She has been treated with aspirin and Brilinta for dual antiplatelet therapy. When I saw her for subsequent evaluation I further titrated her ranolazine to 1000 mg twice a day and further  titrated her Lopressor to 37.5 mg twice a day.  Her creatinine remained normal following her contrast load and she was mildly anemic.    She has a history of asthma and is on Symbicort 160-4.5 and Singulair.  She  has a history of hyperlipidemia, currently on Lipitor 80 mg.  She does have GERD for which he takes Pepcid as well as omeprazole.  She developed some increased wheezing on Lopressor and was changed to bisoprolol 5 mg in place of metoprolol.  She has seen Dr. Melvyn Novas for her severe chronic asthma and in the past.  She also was found to have marked atopy with significant elevation of IgG E levels.  She feels that her breathing has improved with the changed to bisoprolol from Lopressor.   In March 2016 she had been remaining stable but  had experienced a 10-15 minute episode of chest pain during the evening which responded to nitroglycerin. When I saw her the following day she had been taking isosorbide 90 mg in the morning and I added isosorbide 30 mg at bedtime to her medical regimen of Ranexa 1000 mg twice a day , amlodipine 10 mg daily, and bisoprolol 5 mg. She denies recurrent chest pain symptomatology since the nocturnal dose of Imdur was added.  She developed a rash and  was advised to stop both Brilinta and Ranexa.  She was started on Plavix. Her rash ultimately resolved.    Her husband passed away in on 2016-01-03.  She has been more active.  She denies nocturnal symptoms.  She was evaluated in the emergency room in  January 2017 with right shoulder discomfort.    She underwent low back surgery in August 2018 by Dr. Louanne Skye. She tolerated surgery well.  A preoperative nuclear study showed an EF of 70% with probable apical soft tissue attenuation.  There was no ischemia.  Last saw her in November 2018 and she was doing well without chest pain or shortness of breath.  Although she has a history of mild asthma.   She was diagnosed with GERD.  She underwent successful cataract surgery right eye in  February and left eye in April 2019 by Dr. Satira Sark.  Since I saw her in June 2019 she was seen by Kerin Ransom on September 26, 2018 with a history suggestive of recurrent unstable angina.  She was hospitalized and on September 27, 2018 repeat cardiac catheterization by me was performed.  This revealed severe coronary calcification involving the LAD circumflex and RCA.  There was smooth 20 to 25% distal left main narrowing.  The LAD had 40% proximal calcified stenosis.  The LAD was severely calcified with 95% stenosis in the region of the first diagonal and there was diffuse disease in the midsegment of 50% followed by 90% mid stenosis and 80% distal stenosis.  The circumflex was calcified without high-grade obstructive disease.  The RCA was calcified and was totally occluded just prior to the acute margin.  There was extensive left-to-right collateralization to the distal RCA via the left circulation.  There was low normal LV function with an EF of 50 to 55% with a small region of focal mid mild anterolateral and posterior basal inferior hypocontractility.  Due to the severe calcification CABG revascularization surgery was recommended.  She underwent CABG surgery x2 with her LIMA to her LAD and SVG to the PDA.  However, during surgery she was instantly found to have a mediastinal lymph node which was sent for pathology and unfortunately showed metastatic adenocarcinoma.  She is now being followed by Dr. 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  PET and CT imaging of her chest abdomen and pelvis with contrast showed diffuse adenopathy from supraclavicular to the abdomen with a hypermetabolic mass in the gallbladder and large hypermetabolic bony lesion in the posterior left pelvis.    I saw her on December 06, 2018 in follow-up of her CABG revascularization and oncologic assessments.  She underwent successful Port-A-Cath placement on December 20 and Plavix was held for this  procedure.  I recommended that she undergo an echo Doppler study prior to initiating chemotherapy.  Her echo Doppler study was done on December 11, 2018 and showed an EF of 40 to 45% status post revascularization with residual akinesis of the mid apical anteroseptal, inferoseptal and apical wall.  She had grade 1 diastolic dysfunction.  Tissue Doppler was consistent with high ventricular filling pressures.  She had mildly increased PA pressure 38 mm and abnormal global longitudinal strain pattern.  On December 15, 2018 she initiated her first course of chemotherapy.  She had some mild nausea but otherwise felt well.  She is feeling better.  She is scheduled to have a follow-up oncology evaluation later this week.  From a cardiac perspective, she denies any chest pain or shortness of breath.  She is now able to walk from room to room without her previous dyspnea or chest discomfort.  She feels improved.    I saw her in a telemedicine evaluation in April 2020.  Since her December evaluation she had been undergoing successful  chemotherapy every 2 weeks with cis-platinum and gemcitabine.  She has completed 6 cycles and is currently taking a month off with plans for reinstitution on May 1.  Her most recent PET scan was significantly encouraging and showed significant early benefit with reduction in tumor burden.   I last saw her in November 2020 at which time she denied any recurrent chest pain.  She was admitting to fatigue following her chemotherapy treatments.    She has recently undergone reevaluation by Dr. Burr Medico her oncologist and her most recent PET scan from December 2020 showed significant progression of malignancy with increased activity and her gallbladder mass, increase in size and activity of the porta hepatis, retroperitoneal, and upper pelvic adenopathy.  There was also significant increase in size and metabolic activity of the left ischial metastatic lesion with extensive extraosseous component and  new mildly hypermetabolic left subclavicular and thoracic periaortic lymph nodes.  As result, she is scheduled to initiate new therapy next week with Herceptin.  Dr. Burr Medico has requested I see the patient prior to initiation of new therapy and obtain an echo Doppler study.    Per Dr. Ernestina Penna request, I saw Dr. Mel Almond prior to initiating chemotherapy on January 04, 2020.  At that time, her blood pressure was stable on bisoprolol 10 mg in addition to furosemide 20 mg daily.  She continues to be on Plavix 75 mg daily.  She was not having any anginal symptoms.  She appears euvolemic on exam.  Her asthma was well controlled.  She denied any chest pain or shortness of breath at rest.  At times there was some mild shortness of breath with activity.  Her echo Doppler study on January 07, 2020 now showed significant decline in LV function with EF 25 to 30%, restrictive physiology with grade 3 diastolic dysfunction.  She had initiated her chemotherapy with Xeloda 1 week on and 1 week off but following the first dose she became sick with nausea and vomiting and it was felt that she had taken too high a dose.  Her dose was subsequently reduced from 1500 mg twice a day to 500 mg bid which she has tolerated.    She was evaluated by me on January 18, 2020 in a follow-up telemedicine evaluation subsequent to her echo Doppler study revealing  reduced LV function.  During that evaluation she was feeling well and did not have any anginal symptoms.  I discussed that it was possible that her reduced LV function may be contributed by her initial round of chemotherapy.  She had continued to be on bisoprolol 10 mg daily in addition to furosemide 20 mg.  With her decline in LV function I suggested initiation of low-dose ARB therapy with losartan 25 mg with plans hopefully to transition to [facility] depending upon blood pressure response.  She had become ill following her first dose of Xeloda when her symptoms resolved with dose adjustment  to a significantly lower dose.  Over the last month, Cassie Campbell has felt well.  She has tolerated losartan.  She denies any dizziness or chest pain.  Laboratory in February 9 showed a potassium of 4.3, creatinine 0.86.  She was mildly anemic with a hemoglobin of 11.4 hematocrit 34.9.  She presents for follow-up evaluation.   Past Medical History:  Diagnosis Date  . Allergic rhinitis   . Arthritis   . Asthma    xolair s 8/05 ?11/07; mastered hfa 12/20/08  . Benign positional vertigo   . CAD (coronary artery  disease)    a. 09/2014 NSTEMI s/p LHC with sig 2V dz. dLAD diffusely diseased and not suitable for PCI. unsuccessful RCA PCI d/t heavy calcifications  . gallbladder ca dx'd 10/2018  . GERD (gastroesophageal reflux disease)   . Heart attack ([place])    09/2014  . Hyperlipidemia    <130 ldl pos fm hx, bp  . Hypertension   . Osteopenia    dexa 08/22/07 AP spine + 1.1, left femur -1.3, right femur -.8; dexa 10/06/09 +1.6, left femur =1.6, right femur -.  . PONV (postoperative nausea and vomiting)   . Ruptured disc, cervical   . 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 BREAST SURGERY  10-26-10   Rt. breast bx--for microcalcifications in rt. retroareolar region--dx was hyalinized fibroadenoma  . BUNIONECTOMY Bilateral   . CARDIAC CATHETERIZATION     2015  . CATARACT EXTRACTION Right 02/02/2018   Dr Satira Sark  . CATARACT EXTRACTION Left 03/30/2018  . [place] SURGERY  12/01  . CORONARY ARTERY BYPASS GRAFT N/A 10/04/2018   Procedure: CORONARY ARTERY BYPASS GRAFTING (CABG) times 2 using left  Internal mammary artery to LAD, left greater saphenous vein - open harvest.;  Surgeon: [name], MD;  Location: [place];  Service: Open Heart Surgery;  Laterality: N/A;  . IR IMAGING GUIDED PORT INSERTION  12/08/2018  . LEFT HEART CATH AND CORONARY ANGIOGRAPHY N/A 09/27/2018   Procedure: LEFT HEART CATH AND  CORONARY ANGIOGRAPHY;  Surgeon: Troy Sine, MD;  Location: [facility];  Service: Cardiovascular;  Laterality: N/A;  . LEFT HEART CATHETERIZATION WITH CORONARY ANGIOGRAM N/A 10/16/2014   Procedure: LEFT HEART CATHETERIZATION WITH CORONARY ANGIOGRAM;  Surgeon: Blane Ohara, MD;  Location: [facility] CATH LAB;  Service: Cardiovascular;  Laterality: N/A;  . Left L4-5 transforaminal lumbar interbody fusion with Depuy cage, rods and screws, local and allograft bone graft, Vivigen; bilateral decompression/partial hemilaminectomy lumbar five-sacral one  07/2017  . PERCUTANEOUS CORONARY ROTOBLATOR INTERVENTION (PCI-R) N/A 10/17/2014   Procedure: PERCUTANEOUS CORONARY ROTOBLATOR INTERVENTION (PCI-R);  Surgeon: Troy Sine, MD;  Location: [facility] C[facility] LAB;  Service: Cardiovascular;  Laterality: N/A;  . TEE WITHOUT CARDIOVERSION N/A 10/04/2018   Procedure: TRANSESOPHAGEAL ECHOCARDIOGRAM (TEE);  Surgeon: [name], MD;  Location: [place];  Service: Open Heart Surgery;  Laterality: N/A;  . VEIN LIGATION AND STRIPPING Left   . VIDEO BRONCHOSCOPY Bilateral 02/05/2015   Procedure: VIDEO BRONCHOSCOPY WITHOUT FLUORO;  Surgeon: Tanda Rockers, MD;  Location: [facility] ENDOS[facility];  Service: Endoscopy;  Laterality: Bilateral;  . VULVA /PERINEUM BIOPSY  12-30-10   --epidermoid cyst    Allergies  Allergen Reactions  . Fish-Derived Products Shortness Of Breath, Swelling and Rash  . Penicillins Shortness Of Breath, Swelling and Rash    PATIENT HAS HAD A PCN REACTION WITH IMMEDIATE RASH, FACIAL/TONGUE/THROAT SWELLING, SOB, OR LIGHTHEADEDNESS WITH HYPOTENSION:  #  #  #  YES  #  #  #   Has patient had a PCN reaction causing severe rash involving mucus membranes or skin necrosis: No PATIENT HAS HAD A PCN REACTION THAT REQUIRED HOSPITALIZATION:  #  #  #  YES  #  #  #   Has patient had a PCN reaction occurring within the last 10 years: No    . Aspirin Swelling and Rash    SWELLING REACTION UNSPECIFIED   . Brilinta  [Ticagrelor] Rash    Started [employer]  09/2014 - rash - unsure which it is directly related to  . Codeine Phosphate Nausea Only  . Diphenhydramine Hcl Rash    Current Outpatient Medications  Medication Sig Dispense Refill  . albuterol (PROAIR HFA) 108 (90 Base) MCG/ACT inhaler INHALE 1 TO 2 PUFFS BY MOUTH EVERY 4 HOURS AS NEEDED FOR WHEEZING 8.5 g 3  . atorvastatin (LIPITOR) 80 MG tablet Take 40 mg by mouth daily.     . bisoprolol (ZEBETA) 10 MG tablet TAKE 1 TABLET(10 MG) BY MOUTH DAILY 90 tablet 1  . budesonide-formoterol (SYMBICORT) 160-4.5 MCG/ACT inhaler INHALE 2 PUFFS BY MOUTH EVERY 12 HOURS 30.6 g 3  . capecitabine (XELODA) 500 MG tablet Take 3 tablets (1,500 mg total) by mouth 2 (two) times daily after a meal. 7 days on and 7 days off 84 tablet 1  . cetirizine (ZYRTEC) 10 MG tablet Take 10 mg by mouth at bedtime as needed for allergies.    . cholecalciferol 2000 units TABS Take 1 tablet (2,000 Units total) by mouth daily. 30 tablet 3  . clopidogrel (PLAVIX) 75 MG tablet TAKE 1 TABLET(75 MG) BY MOUTH DAILY 90 tablet 3  . dexamethasone (DECADRON) 4 MG tablet Take 1 tablet (4 mg total) by mouth 2 (two) times daily with a meal. (Patient taking differently: Take 4 mg by mouth 2 (two) times daily with a meal. Takes prn SOB after chemo) 20 tablet 0  . dextromethorphan-guaiFENesin (MUCINEX DM) 30-600 MG per 12 hr tablet Take 1-2 tablets by mouth every 12 (twelve) hours as needed for cough (with flutter).     . famotidine (PEPCID) 20 MG tablet Take 20 mg by mouth as needed.     . ferrous sulfate 325 (65 FE) MG tablet Take 650 mg by mouth daily with breakfast.     . fluticasone (FLONASE) 50 MCG/ACT nasal spray Place 1-2 sprays into both nostrils 2 (two) times daily as needed for allergies or rhinitis.    Marland Kitchen gabapentin (NEURONTIN) 100 MG capsule Take 1 capsule (100 mg total) by mouth 3 (three) times daily. Start at '100mg'$  at night first, and gradually increase to '300mg'$  at night, and '100mg'$  am and '100mg'$   pm over the next month 90 capsule 0  . lidocaine-prilocaine (EMLA) cream APPLY EXTERNALLY TO THE AFFECTED AREA ONCE DAILY AS DIRECTED 30 g 3  . magic mouthwash SOLN Take 5 mLs by mouth 4 (four) times daily. Leave Benadryl out of compound since pt allergic to it. Use hydrocortisone & nystatin. Swish & swallow or spit for thrush 240 mL 1  . MAGNESIUM-OXIDE 400 (241.3 Mg) MG tablet TAKE 1 TABLET(400 MG) BY MOUTH THREE TIMES DAILY 90 tablet 1  . meclizine (ANTIVERT) 25 MG tablet TAKE 2 TABLETS BY MOUTH THREE TIMES DAILY AS NEEDED 24 tablet 2  . montelukast (SINGULAIR) 10 MG tablet TAKE 1 TABLET(10 MG) BY MOUTH AT BEDTIME 90 tablet 1  . Multiple Vitamin (MULTIVITAMIN) capsule Take 1 capsule by mouth daily.     . ondansetron (ZOFRAN) 8 MG tablet Take 1 tablet (8 mg total) by mouth every 8 (eight) hours as needed for nausea or vomiting. 30 tablet 1  . oxyCODONE (OXY IR/ROXICODONE) 5 MG immediate release tablet Take 1 tablet (5 mg total) by mouth every 8 (eight) hours as needed for severe pain. 45 tablet 0   No current facility-administered medications for this visit.    Social History   Socioeconomic History  . Marital status: Widowed    Spouse name: Not on file  .  Number of children: Not on file  . Years of education: Not on file  . Highest education level: Not on file  Occupational History  . Occupation: retired Tour manager  Tobacco Use  . Smoking status: Never Smoker  . Smokeless tobacco: Never Used  Substance and Sexual Activity  . Alcohol use: No    Alcohol/week: 0.0 standard drinks  . Drug use: No  . Sexual activity: Not Currently    Partners: Male    Birth control/protection: Post-menopausal  Other Topics Concern  . Not on file  Social History Narrative  . Not on file   Social Determinants of Health   Financial Resource Strain:   . Difficulty of Paying Living Expenses: Not on file  Food Insecurity:   . Worried About Charity fundraiser in the Last Year: Not on  file  . Ran Out of Food in the Last Year: Not on file  Transportation Needs:   . Lack of Transportation (Medical): Not on file  . Lack of Transportation (Non-Medical): Not on file  Physical Activity:   . Days of Exercise per Week: Not on file  . Minutes of Exercise per Session: Not on file  Stress:   . Feeling of Stress : Not on file  Social Connections:   . Frequency of Communication with Friends and Family: Not on file  . Frequency of Social Gatherings with Friends and Family: Not on file  . Attends Religious Services: Not on file  . Active Member of Clubs or Organizations: Not on file  . Attends Archivist Meetings: Not on file  . Marital Status: Not on file  Intimate Partner Violence:   . Fear of Current or Ex-Partner: Not on file  . Emotionally Abused: Not on file  . Physically Abused: Not on file  . Sexually Abused: Not on file    Family History  Problem Relation Age of Onset  . Diabetes Mother        AODM  . Stroke Mother   . Hypertension Mother   . Heart disease Father   . Diabetes Sister        AODM  . Hypertension Sister   . 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 Other comprehensive 14 point system review is negative   Physical Exam BP 109/63   Pulse 66   Temp (!) 96.9 F (36.1 C)   Ht '4\' 11"'$  (1.499 m)   Wt 117 lb (53.1 kg)   LMP 12/21/1999   SpO2 98%   BMI 23.63 kg/m    Repeat blood pressure by me was 118/68 supine and 110/60 standing  Wt Readings from Last 3 Encounters:  02/15/20 117 lb (53.1 kg)  02/05/20 117  lb 9.6 oz (53.3 kg)  01/29/20 119 lb 8 oz (54.2 kg)   General: Alert, oriented, no distress.  Skin: normal turgor, no rashes, warm and dry HEENT: Normocephalic, atraumatic. Pupils equal round and reactive to light; sclera anicteric; extraocular muscles intact;  Nose without nasal septal hypertrophy Mouth/Parynx benign; Mallinpatti scale 2 Neck: No JVD, no carotid bruits; normal carotid upstroke Lungs: clear to ausculatation and percussion; no wheezing or rales Chest wall: without tenderness to palpitation Heart: PMI not displaced, RRR, s1 s2 normal, 1/6 systolic murmur, no diastolic murmur, no rubs, gallops, thrills, or heaves Abdomen: soft, nontender; no hepatosplenomehaly, BS+; abdominal aorta nontender and not dilated by palpation. Back: no CVA tenderness Pulses 2+ Musculoskeletal: full range of motion, normal strength, no joint deformities Extremities: no clubbing cyanosis or edema, Homan's sign negative  Neurologic: grossly nonfocal; Cranial nerves grossly wnl Psychologic: Normal mood and affect   January 04, 2020 ECG (independently read by me): Normal sinus rhythm at 65 bpm.  QS complex V1 V2 with T wave abnormality V1 through V5.  Normal intervals.  No ectopy  November 2020 ECG (independently read by me): Sinus rhythm at 89 bpm.  Nonspecific T wave abnormality.  Normal intervals.  No ectopy  January 2020 ECG (independently read by me): Normal sinus  rhythm at 78 bpm.  Biatrial enlargement.  QS complex V1 through V3.  Anterolateral T wave abnormality.  December 2019 ECG (independently read by me): Sinus rhythm at 76 bpm.  QS complex anteroseptally.  Lateral T wave abnormality.    June 2019 ECG (independently read by me): Normal sinus rhythm 81 bpm.  Right bundle branch block with repolarization changes.  November 16, 2017 ECG (independently read by me): Normal sinus rhythm at 75 bpm with right bundle branch block.  Left axis deviation.  Q waves in 3 and aVF.  April 2018 ECG (independently read by me): Normal sinus rhythm at 74 bpm.  Right bundle-branch block with repolarization changes.  October 2017 ECG (independently read by me): Normal sinus rhythm at 68 bpm.  The right bundle branch block with repolarization changes.  April 2017 ECG (independently read by me): Normal sinus rhythm at 70 bpm.  Right bundle-branch block with repolarization changes.  November 2016 ECG (independently read by me): Normal sinus rhythm at 70 bpm.  Right bundle-branch block with repolarization changes.  May 2016 ECG (independently read by me):  Normal sinus rhythm at 65 bpm. Right bundle branch block with repolarization changes.  March 2016 ECG (independently read by me): Normal sinus rhythm at 68 bpm.  No ectopy.  No signal been ST segment changes.  December 2015 ECG (independently read by me): Normal sinus rhythm at 77 bpm.  Normal intervals.  No significant ST changes.  November 2015 ECG (independently read by me):sinus rhythm at 88 bpm.  QTc interval 454 ms.  No significant ST-T changes.  LABS:  BMP Latest Ref Rng & Units 01/29/2020 01/09/2020 12/13/2019  Glucose 70 - 99 mg/dL 79 99 101(H)  BUN 8 - 23 mg/dL '10 13 10  '$ Creatinine 0.44 - 1.00 mg/dL 0.86 0.86 0.88  BUN/Creat Ratio 12 - 28 - - -  Sodium 135 - 145 mmol/L 140 140 139  Potassium 3.5 - 5.1 mmol/L 4.3 4.0 3.9  Chloride 98 - 111 mmol/L 104 102 100  CO2 22 - 32 mmol/L '28 27 29  '$ Calcium 8.9 -  10.3 mg/dL 10.2 9.6 9.2    Hepatic Function Latest Ref Rng & Units 01/29/2020 01/09/2020 12/13/2019  Total  Protein 6.5 - 8.1 g/dL 8.4(H) 8.8(H) 8.2(H)  Albumin 3.5 - 5.0 g/dL 3.6 3.5 3.3(L)  AST 15 - 41 U/L 30 33 31  ALT 0 - 44 U/L '15 14 10  '$ Alk Phosphatase 38 - 126 U/L 65 74 81  Total Bilirubin 0.3 - 1.2 mg/dL 0.4 0.4 0.3  Bilirubin, Direct 0.0 - 0.3 mg/dL - - -    CBC Latest Ref Rng & Units 01/29/2020 01/09/2020 12/13/2019  WBC 4.0 - 10.5 K/uL 5.1 5.8 6.5  Hemoglobin 12.0 - 15.0 g/dL 11.4(L) 10.9(L) 10.0(L)  Hematocrit 36.0 - 46.0 % 34.9(L) 34.9(L) 31.1(L)  Platelets 150 - 400 K/uL 261 321 233   Lab Results  Component Value Date   MCV 97.2 01/29/2020   MCV 99.7 01/09/2020   MCV 101.3 (H) 12/13/2019   Lab Results  Component Value Date   TSH 1.360 11/06/2019   Lab Results  Component Value Date   HGBA1C 5.9 (H) 10/04/2018    Lipid Panel     Component Value Date/Time   CHOL 201 (H) 11/06/2019 1010   TRIG 147 11/06/2019 1010   TRIG 85 10/05/2006 1022   HDL 52 11/06/2019 1010   CHOLHDL 3.9 11/06/2019 1010   CHOLHDL 3 03/09/2018 0909   VLDL 21.8 03/09/2018 0909   LDLCALC 123 (H) 11/06/2019 1010   RADIOLOGY: No results found.  IMPRESSION:  1. CAD in native artery   2. S/P CABG (coronary artery bypass graft)   3. Ischemic cardiomyopathy   4. Grade III diastolic dysfunction   5. Medication management   6. Metastatic adenocarcinoma [facility])     ASSESSMENT AND PLAN: Cassie Campbell is a 79 year-old African-American female who suffered a NSTEMI in October 2015 which was felt to be due to subtotal high-grade calcified RCA with severe diffuse distal disease beyond her subtotal stenosis.  She also has a severely diffusely diseased mid distal LAD which is not amenable for revascularization.  At the time of her intervention, the smallest balloon catheter that was available in the catheterization laboratory was a 1.20 mm and this was unable to cross the stenosis.  She was  treated medically and did well until October 2019 when she developed recurrent angina consistent with unstable symptoms necessitating repeat catheterization.  She had severely calcified CAD  and unsuccessful CABG revascularization surgery.  She did well from a cardiac surgical standpoint but unfortunately was found to have mediastinal adenopathy and has been diagnosed with metastatic adenocarcinoma of gallbladder cancer etiology.  Her echo Doppler study prior to initiating chemotherapy showed an EF of 40 to 45% with akinesis of the mid apical anteroseptal, inferoseptal, and apical myocardium with anterior hypokinesis.  She had grade 1 diastolic dysfunction and abnormal tissue Doppler consistent with high ventricular filling pressures.  Systolic peak pressure was elevated at 38 mm.  Global strain was abnormal.  She initiated chemotherapy on December 15, 2018  and tolerated her initial treatment fairly well. She is followed by Dr. Burr Medico and subsequent imaging studies had initially shown a reduction of her tumor burden.  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  When I saw her prior to instituting therapy she appeared euvolemic.  I again reviewed her most recent echo Doppler study which now shows an EF reduced at 25 to 30% percent with restrictive physiology/grade 3 diastolic dysfunction.  At her telemedicine evaluation subsequent to  the echo I initiated low-dose ARB therapy with losartan.  She seems to be tolerating losartan well and denies any significant symptoms today.  He does not have signs of edema.  In an attempt to transition her to low-dose Entresto, I am recommending she discontinue furosemide as well as losartan and I have given her samples of Entresto 24/26 mg to initiate tomorrow twice a day.  In 2 weeks I will check a be met as well as proBNP and I have  arranged for her to have a follow-up visit with our [employer], Pharm.D. on February 27, 8349 to make certain she is tolerating therapy.  I will see her in 6 to 8 weeks for follow-up evaluation.   Troy Sine, MD, [facility]  02/17/2020 3:42 PM

## 2020-02-18 ENCOUNTER — Other Ambulatory Visit: Payer: Self-pay

## 2020-02-18 ENCOUNTER — Inpatient Hospital Stay: Payer: Medicare Other | Attending: Obstetrics

## 2020-02-18 ENCOUNTER — Inpatient Hospital Stay: Payer: Medicare Other

## 2020-02-18 ENCOUNTER — Encounter: Payer: Self-pay | Admitting: Hematology

## 2020-02-18 ENCOUNTER — Inpatient Hospital Stay (HOSPITAL_BASED_OUTPATIENT_CLINIC_OR_DEPARTMENT_OTHER): Payer: Medicare Other | Admitting: Hematology

## 2020-02-18 VITALS — BP 105/60 | HR 79 | Temp 97.8°F | Resp 19 | Ht 59.0 in | Wt 116.3 lb

## 2020-02-18 DIAGNOSIS — C23 Malignant neoplasm of gallbladder: Secondary | ICD-10-CM

## 2020-02-18 DIAGNOSIS — M79605 Pain in left leg: Secondary | ICD-10-CM | POA: Insufficient documentation

## 2020-02-18 DIAGNOSIS — D649 Anemia, unspecified: Secondary | ICD-10-CM | POA: Insufficient documentation

## 2020-02-18 DIAGNOSIS — J45909 Unspecified asthma, uncomplicated: Secondary | ICD-10-CM | POA: Insufficient documentation

## 2020-02-18 DIAGNOSIS — I2510 Atherosclerotic heart disease of native coronary artery without angina pectoris: Secondary | ICD-10-CM | POA: Insufficient documentation

## 2020-02-18 DIAGNOSIS — I110 Hypertensive heart disease with heart failure: Secondary | ICD-10-CM | POA: Insufficient documentation

## 2020-02-18 DIAGNOSIS — C7951 Secondary malignant neoplasm of bone: Secondary | ICD-10-CM | POA: Insufficient documentation

## 2020-02-18 DIAGNOSIS — M8580 Other specified disorders of bone density and structure, unspecified site: Secondary | ICD-10-CM | POA: Insufficient documentation

## 2020-02-18 LAB — CBC WITH DIFFERENTIAL (CANCER CENTER ONLY)
Abs Immature Granulocytes: 0.01 10*3/uL (ref 0.00–0.07)
Basophils Absolute: 0 10*3/uL (ref 0.0–0.1)
Basophils Relative: 0 %
Eosinophils Absolute: 0 10*3/uL (ref 0.0–0.5)
Eosinophils Relative: 1 %
HCT: 38 % (ref 36.0–46.0)
Hemoglobin: 12.4 g/dL (ref 12.0–15.0)
Immature Granulocytes: 0 %
Lymphocytes Relative: 11 %
Lymphs Abs: 0.4 10*3/uL — ABNORMAL LOW (ref 0.7–4.0)
MCH: 31.9 pg (ref 26.0–34.0)
MCHC: 32.6 g/dL (ref 30.0–36.0)
MCV: 97.7 fL (ref 80.0–100.0)
Monocytes Absolute: 0.4 10*3/uL (ref 0.1–1.0)
Monocytes Relative: 11 %
Neutro Abs: 3.1 10*3/uL (ref 1.7–7.7)
Neutrophils Relative %: 77 %
Platelet Count: 304 10*3/uL (ref 150–400)
RBC: 3.89 MIL/uL (ref 3.87–5.11)
RDW: 18.1 % — ABNORMAL HIGH (ref 11.5–15.5)
WBC Count: 4 10*3/uL (ref 4.0–10.5)
nRBC: 0 % (ref 0.0–0.2)

## 2020-02-18 LAB — CMP (CANCER CENTER ONLY)
ALT: 13 U/L (ref 0–44)
AST: 24 U/L (ref 15–41)
Albumin: 3.4 g/dL — ABNORMAL LOW (ref 3.5–5.0)
Alkaline Phosphatase: 58 U/L (ref 38–126)
Anion gap: 10 (ref 5–15)
BUN: 16 mg/dL (ref 8–23)
CO2: 25 mmol/L (ref 22–32)
Calcium: 9.2 mg/dL (ref 8.9–10.3)
Chloride: 102 mmol/L (ref 98–111)
Creatinine: 1.53 mg/dL — ABNORMAL HIGH (ref 0.44–1.00)
GFR, Est AFR Am: 38 mL/min — ABNORMAL LOW (ref >60)
GFR, Estimated: 32 mL/min — ABNORMAL LOW (ref >60)
Glucose, Bld: 120 mg/dL — ABNORMAL HIGH (ref 70–99)
Potassium: 3.8 mmol/L (ref 3.5–5.1)
Sodium: 137 mmol/L (ref 135–145)
Total Bilirubin: 0.6 mg/dL (ref 0.3–1.2)
Total Protein: 8.7 g/dL — ABNORMAL HIGH (ref 6.5–8.1)

## 2020-02-18 LAB — MAGNESIUM: Magnesium: 1.8 mg/dL (ref 1.7–2.4)

## 2020-02-18 MED ORDER — HEPARIN SOD (PORK) LOCK FLUSH 100 UNIT/ML IV SOLN
500.0000 [IU] | Freq: Once | INTRAVENOUS | Status: AC | PRN
  Administered 2020-02-18: 500 [IU]
  Filled 2020-02-18: qty 5

## 2020-02-18 MED ORDER — ZOLEDRONIC ACID 4 MG/100ML IV SOLN
INTRAVENOUS | Status: AC
  Filled 2020-02-18: qty 100

## 2020-02-18 MED ORDER — SODIUM CHLORIDE 0.9 % IV SOLN
INTRAVENOUS | Status: DC
  Filled 2020-02-18 (×2): qty 250

## 2020-02-18 MED ORDER — SODIUM CHLORIDE 0.9% FLUSH
10.0000 mL | Freq: Once | INTRAVENOUS | Status: AC | PRN
  Administered 2020-02-18: 10 mL
  Filled 2020-02-18: qty 10

## 2020-02-18 NOTE — Patient Instructions (Signed)

## 2020-02-18 NOTE — Addendum Note (Signed)
Addended by: Truitt Merle on: 02/18/2020 10:24 AM   Modules accepted: Orders

## 2020-02-19 ENCOUNTER — Telehealth: Payer: Self-pay | Admitting: Hematology

## 2020-02-19 LAB — CANCER ANTIGEN 19-9: CA 19-9: 586 U/mL — ABNORMAL HIGH (ref 0–35)

## 2020-02-19 NOTE — Telephone Encounter (Signed)
Scheduled appt per 3/1 los. Spoke with pt and she is aware of her appt date and time.

## 2020-02-20 ENCOUNTER — Telehealth: Payer: Self-pay | Admitting: *Deleted

## 2020-02-20 ENCOUNTER — Other Ambulatory Visit: Payer: Self-pay | Admitting: Hematology

## 2020-02-20 MED ORDER — CAPECITABINE 500 MG PO TABS: ORAL_TABLET | ORAL | 0 refills | Status: DC

## 2020-02-20 NOTE — Telephone Encounter (Signed)
Per Dr. Burr Medico, called to make pt aware of that her CA19.9 has decreased significantly, which she is likely responding to her oral chemo. Patient was appreciative and verbalized understanding

## 2020-02-20 NOTE — Telephone Encounter (Signed)
-----   Message from Truitt Merle, MD sent at 02/20/2020  7:22 AM EST ----- Please let pt know her CA19.9 has decreased significantly, she is likely responding to her oral chemo, good news.   Truitt Merle  02/20/2020

## 2020-02-21 NOTE — Progress Notes (Signed)
  Radiation Oncology         [phone] ________________________________  Name: Cassie Campbell MRN: [id number]  Date: 12/07/2019  DOB: 1941-11-10  SIMULATION AND TREATMENT PLANNING NOTE  DIAGNOSIS:     ICD-10-CM   1. Bone metastasis ([place])  C79.51      Site:  Left pelvis  NARRATIVE:  The patient was brought to the [facility].  Identity was confirmed.  All relevant records and images related to the planned course of therapy were reviewed.   Written consent to proceed with treatment was confirmed which was freely given after reviewing the details related to the planned course of therapy had been reviewed with the patient.  Then, the patient was set-up in a stable reproducible  supine position for radiation therapy.  CT images were obtained.  Surface markings were placed.    Medically necessary complex treatment device(s) for immobilization:  Vac-lock bag.   The CT images were loaded into the planning software.  Then the target and avoidance structures were contoured.  Treatment planning then occurred.  The radiation prescription was entered and confirmed.  A total of 3 complex treatment devices were fabricated which relate to the designed radiation treatment fields. Each of these customized fields/ complex treatment devices will be used on a daily basis during the radiation course. I have requested : Isodose Plan.   PLAN:  The patient will receive 37.5 Gy in 15 fractions.  ________________________________   Jodelle Gross, MD, PhD

## 2020-02-25 ENCOUNTER — Telehealth: Payer: Self-pay

## 2020-02-25 ENCOUNTER — Inpatient Hospital Stay: Payer: Medicare Other

## 2020-02-25 ENCOUNTER — Other Ambulatory Visit: Payer: Self-pay

## 2020-02-25 ENCOUNTER — Telehealth: Payer: Self-pay | Admitting: Hematology

## 2020-02-25 DIAGNOSIS — C23 Malignant neoplasm of gallbladder: Secondary | ICD-10-CM | POA: Diagnosis not present

## 2020-02-25 LAB — CBC WITH DIFFERENTIAL (CANCER CENTER ONLY)
Abs Immature Granulocytes: 0.02 10*3/uL (ref 0.00–0.07)
Basophils Absolute: 0 10*3/uL (ref 0.0–0.1)
Basophils Relative: 0 %
Eosinophils Absolute: 0.3 10*3/uL (ref 0.0–0.5)
Eosinophils Relative: 7 %
HCT: 37.7 % (ref 36.0–46.0)
Hemoglobin: 12.5 g/dL (ref 12.0–15.0)
Immature Granulocytes: 0 %
Lymphocytes Relative: 17 %
Lymphs Abs: 0.8 10*3/uL (ref 0.7–4.0)
MCH: 32.3 pg (ref 26.0–34.0)
MCHC: 33.2 g/dL (ref 30.0–36.0)
MCV: 97.4 fL (ref 80.0–100.0)
Monocytes Absolute: 0.4 10*3/uL (ref 0.1–1.0)
Monocytes Relative: 8 %
Neutro Abs: 3.3 10*3/uL (ref 1.7–7.7)
Neutrophils Relative %: 68 %
Platelet Count: 316 10*3/uL (ref 150–400)
RBC: 3.87 MIL/uL (ref 3.87–5.11)
RDW: 18.5 % — ABNORMAL HIGH (ref 11.5–15.5)
WBC Count: 4.8 10*3/uL (ref 4.0–10.5)
nRBC: 0 % (ref 0.0–0.2)

## 2020-02-25 LAB — CMP (CANCER CENTER ONLY)
ALT: 12 U/L (ref 0–44)
AST: 25 U/L (ref 15–41)
Albumin: 3.5 g/dL (ref 3.5–5.0)
Alkaline Phosphatase: 56 U/L (ref 38–126)
Anion gap: 10 (ref 5–15)
BUN: 11 mg/dL (ref 8–23)
CO2: 27 mmol/L (ref 22–32)
Calcium: 9.8 mg/dL (ref 8.9–10.3)
Chloride: 103 mmol/L (ref 98–111)
Creatinine: 1.03 mg/dL — ABNORMAL HIGH (ref 0.44–1.00)
GFR, Est AFR Am: >60 mL/min
GFR, Estimated: 52 mL/min — ABNORMAL LOW
Glucose, Bld: 100 mg/dL — ABNORMAL HIGH (ref 70–99)
Potassium: 3.9 mmol/L (ref 3.5–5.1)
Sodium: 140 mmol/L (ref 135–145)
Total Bilirubin: 0.4 mg/dL (ref 0.3–1.2)
Total Protein: 8.4 g/dL — ABNORMAL HIGH (ref 6.5–8.1)

## 2020-02-25 LAB — MAGNESIUM: Magnesium: 1.4 mg/dL — CL (ref 1.7–2.4)

## 2020-02-25 MED ORDER — MAGNESIUM OXIDE 400 (241.3 MG) MG PO TABS: 400.0000 mg | ORAL_TABLET | Freq: Three times a day (TID) | ORAL | 2 refills | Status: DC

## 2020-02-25 MED FILL — CAPECITABINE 500 MG TABLET: 500 | 28 days supply | Qty: 56 | Fill #0

## 2020-02-25 NOTE — Telephone Encounter (Signed)
Please let pt restart oral mag three times daily if she is off now, if she is still on, please schedule 2hr IV magnesium today or tomorrow. Please let her know her Cr is much better today, OK to start this cycle Xeloda today. Thanks   Truitt Merle MD

## 2020-02-25 NOTE — Telephone Encounter (Signed)
Critical Value:  Mag 1.4

## 2020-02-25 NOTE — Telephone Encounter (Signed)
I spoke with Cassie Campbell she is only taking magnesium 2 tablets daily on a regular basis.  She frequently does not take the third tablet.  I let her know that her kidney function has improved and she can start the capecitabine tomorrow as planned.   Scheduling message sent for Cassie Campbell to be scheduled 3/10 for magnesium 4 gram infusion.

## 2020-02-25 NOTE — Telephone Encounter (Signed)
Scheduled appt per 3/8 sch message - pt aware of appt date and time   

## 2020-02-27 ENCOUNTER — Other Ambulatory Visit: Payer: Self-pay

## 2020-02-27 ENCOUNTER — Inpatient Hospital Stay: Payer: Medicare Other

## 2020-02-27 DIAGNOSIS — C23 Malignant neoplasm of gallbladder: Secondary | ICD-10-CM

## 2020-02-27 LAB — BASIC METABOLIC PANEL
BUN/Creatinine Ratio: 11 — ABNORMAL LOW (ref 12–28)
BUN: 11 mg/dL (ref 8–27)
CO2: 24 mmol/L (ref 20–29)
Calcium: 9.6 mg/dL (ref 8.7–10.3)
Chloride: 100 mmol/L (ref 96–106)
Creatinine, Ser: 1.02 mg/dL — ABNORMAL HIGH (ref 0.57–1.00)
GFR calc Af Amer: 61 mL/min/{1.73_m2} (ref >59)
GFR calc non Af Amer: 53 mL/min/{1.73_m2} — ABNORMAL LOW (ref >59)
Glucose: 97 mg/dL (ref 65–99)
Potassium: 4.5 mmol/L (ref 3.5–5.2)
Sodium: 139 mmol/L (ref 134–144)

## 2020-02-27 LAB — PRO B NATRIURETIC PEPTIDE: NT-Pro BNP: 951 pg/mL — ABNORMAL HIGH (ref 0–738)

## 2020-02-27 MED ORDER — SODIUM CHLORIDE 0.9 % IV SOLN
INTRAVENOUS | Status: DC
  Filled 2020-02-27: qty 250

## 2020-02-27 MED ORDER — MAGNESIUM SULFATE 4 GM/100ML IV SOLN
4.0000 g | Freq: Once | INTRAVENOUS | Status: AC
  Administered 2020-02-27: 4 g via INTRAVENOUS
  Filled 2020-02-27: qty 100

## 2020-02-27 MED ORDER — SODIUM CHLORIDE 0.9 % IV SOLN
500.0000 mL | Freq: Once | INTRAVENOUS | Status: AC
  Administered 2020-02-27: 500 mL via INTRAVENOUS
  Filled 2020-02-27: qty 500

## 2020-02-27 MED ORDER — HEPARIN SOD (PORK) LOCK FLUSH 100 UNIT/ML IV SOLN
500.0000 [IU] | Freq: Once | INTRAVENOUS | Status: AC | PRN
  Administered 2020-02-27: 500 [IU]
  Filled 2020-02-27: qty 5

## 2020-02-27 MED ORDER — SODIUM CHLORIDE 0.9% FLUSH
10.0000 mL | Freq: Once | INTRAVENOUS | Status: AC | PRN
  Administered 2020-02-27: 10 mL
  Filled 2020-02-27: qty 10

## 2020-02-27 MED ORDER — SODIUM CHLORIDE 0.9 % IV SOLN: 4.0000 g | Freq: Once | INTRAVENOUS | 0 refills | Status: DC

## 2020-02-27 NOTE — Progress Notes (Signed)
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 Lymph node, biopsy, mediastinal - LYMPH NODE WITH METASTATIC ADENOCARCINOMA. - SEE MICROSCOPIC DESCRIPTION 2. Plaque, coronary artery - CALCIFIED ATHEROSCLEROTIC PLAQUE.   10/14/2018 Miscellaneous   Foundation One:  MSI stable tumor mutation burden 3Muts/mb ERBB2 amplification CCNE1 amplification TP53 mutation(+) CDK6 amplification  HGF amplification    11/13/2018 Cancer Staging   Staging form: Gallbladder, AJCC 8th Edition - Clinical stage from 11/13/2018: Stage IVB (cTX, cN2, pM1) - Signed by Truitt Merle, MD on 12/15/2018   11/17/2018 Imaging   11/17/2018 CT CAP IMPRESSION: 1. 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 This is associated with extensive upper abdominal and retroperitoneal lymphadenopathy, as well as metastatic lymphadenopathy in the posterior mediastinum and left supraclavicular region. There is also a metastatic lesion to the left ischium. 2. 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 3. Aortic atherosclerosis, in addition to left main and 3 vessel coronary artery disease. Status post median sternotomy for CABG including LIMA to the LAD. 4. There are calcifications of the aortic valve. Echocardiographic correlation for  evaluation of potential valvular dysfunction may be warranted if clinically indicated.    11/21/2018 Initial Diagnosis   Gallbladder cancer ([place])   11/27/2018 Pathology Results   11/27/2018 CA19-9 immunohistochemical stain Per request, a CA19-9 immunohistochemical stain was performed at an outside institution revealing positive staining in the tumor cells.    12/15/2018 - 11/30/2019 Chemotherapy   First line chemo cisplatin and gemcitabine every 2 weeks on 12/15/18. Had 1 month chemo break in April, restarted on 04/20/19. Stopped 11/30/19 due to disease progression.    01/26/2019 -  Chemotherapy   Zometa q13month starting 01/26/19-11/02/19. Switched to monthly Xgeva injections in 03/2020   02/20/2019 PET scan   Restaging PET scan:  IMPRESSION: 1. Positive response to therapy at all primary and metastatic sites. Persistent primary and metastatic lesions do have intense hypermetabolic activity albeit reduced. 2. Decrease in size and hypermetabolic activity of mass lesion in the gallbladder fundus. 3. Interval decrease in size and metabolic activity of periportal adenopathy. 4. Decrease in size and metabolic activity of periaortic lymph nodes. 5. Interval decrease in size and metabolic activity of destructive lesion in the LEFT inferior pubic ramus with new sclerosis. 6. No new or progressive disease.   05/29/2019 Imaging   CT CAP W Contrast  IMPRESSION: Known gallbladder adenocarcinoma, difficult to compare to recent PET, improved from prior CT.  Upper abdominal/retroperitoneal lymphadenopathy, slightly improved from prior PET.  Osseous metastasis involving the left inferior pubic ramus, grossly unchanged.  No evidence of metastatic disease in the chest.  Additional ancillary findings as above.   08/22/2019 Imaging   CT CAP W Contrast  IMPRESSION: 1. Stable soft tissue mass involving the gallbladder fundus. 2. Stable mild porta hepatis and retroperitoneal lymphadenopathy. 3.  Stable sclerotic bone metastases. 4. No new or progressive metastatic disease  identified. 5. Stable 2.3 cm left thyroid lobe nodule.   11/14/2019 Imaging   CT Left Hip WO  contrast  IMPRESSION: 1. 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 Associated soft tissue components have progressed, but there is no evidence of pathologic fracture. 2. No acute osseous findings. 3. 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   12/11/2019 PET scan   IMPRESSION: 1. Significant progression of malignancy, with increased activity in the gallbladder mass; considerable increase in size and activity of porta hepatis, retroperitoneal, and upper pelvic adenopathy; significant increase in size and metabolic activity of the left ischial metastatic lesion with extensive extraosseous component; and new mildly hypermetabolic left supraclavicular and thoracic periaortic lymph nodes suspicious for malignant involvement. 2. Faintly increased activity in a left thyroid nodule which was not previously hypermetabolic. Given the relatively low-grade activity, this may be amenable to surveillance, but a significant minority of thyroid nodules with hypermetabolic activity can represent thyroid cancer. 3. Other imaging findings of potential clinical significance: Chronic paranasal sinusitis. Chronic right middle lobe atelectasis. Aortic Atherosclerosis (ICD10-I70.0). Coronary atherosclerosis with cardiomegaly.   12/17/2019 - 01/07/2020 Radiation Therapy   Palliative Radiation with Dr. Lisbeth Renshaw 12/17/19-01/07/20   01/10/2020 -  Chemotherapy   Xeloda 1500 mg BID 1 week on/1 week off starting on 01/10/20, she tolerated poorly and developed n/v, weakness, and low po intake. She was instructed to dose reduce to 500 mg BID which she continued into C2. Increased to 105m Am and 5051mPM on 02/12/20 and increased to 100075mID starting 02/26/20 starting with C4.       CURRENT THERAPY:  -Zometa q3mo39monthtarting 01/26/19-11/02/19. Switched to monthly Xgeva injections in 03/2020 -Xeloda 1500 mg BID 1 week on/1 week off starting on 01/10/20, she tolerated poorly and developed n/v, weakness, and low po intake. She was instructed to dose reduce to 500 mg BID which she continued into C2. Increased to 1000mg62mand 500mg 19mn 02/12/20 and increased to 1000mg B52mtarting 02/26/20 starting with C4.   INTERVAL HISTORY:  Cassie VANCLEVEe for a follow up ad treatment. She presents to the clinic alone. She notes she continues to lose wight. She notes she is trying other protein because she cannot take Ensure due to milk. She notes she is eating 3 meals a day which is smaller than usual. She tolerating her Xeloda increase well with no major changes or side effects. She denies stomach pain or nausea. She is on her off week and plans to start next week.     REVIEW OF SYSTEMS:   Constitutional: Denies fevers, chills (+) Fluctuating weight  Eyes: Denies blurriness of vision Ears, nose, mouth, throat, and face: Denies mucositis or sore throat Respiratory: Denies cough, dyspnea or wheezes Cardiovascular: Denies palpitation, chest discomfort or lower extremity swelling Gastrointestinal:  Denies nausea, heartburn or change in bowel habits Skin: Denies abnormal skin rashes Lymphatics: Denies new lymphadenopathy or easy bruising Neurological:Denies numbness, tingling or new weaknesses Behavioral/Psych: Mood is stable, no new changes  All other systems were reviewed with the patient and are negative.  MEDICAL HISTORY:  Past Medical History:  Diagnosis Date  . Allergic rhinitis   . Arthritis   . Asthma    xolair s 8/05 ?11/07; mastered hfa 12/20/08  . Benign positional vertigo   . CAD (coronary artery disease)    a. 09/2014 NSTEMI s/p LHC with sig 2V dz. dLAD diffusely diseased and not suitable for PCI. unsuccessful RCA PCI d/t heavy calcifications  .  gallbladder ca dx'd 10/2018  . GERD (gastroesophageal  reflux disease)   . Heart attack ([place])    09/2014  . Hyperlipidemia    <130 ldl pos fm hx, bp  . Hypertension   . Osteopenia    dexa 08/22/07 AP spine + 1.1, left femur -1.3, right femur -.8; dexa 10/06/09 +1.6, left femur =1.6, right femur -.  . PONV (postoperative nausea and vomiting)   . Ruptured disc, cervical   . 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 BREAST SURGERY  10-26-10   Rt. breast bx--for microcalcifications in rt. retroareolar region--dx was hyalinized fibroadenoma  . BUNIONECTOMY Bilateral   . CARDIAC CATHETERIZATION     2015  . CATARACT EXTRACTION Right 02/02/2018   Dr Satira Sark  . CATARACT EXTRACTION Left 03/30/2018  . [place] SURGERY  12/01  . CORONARY ARTERY BYPASS GRAFT N/A 10/04/2018   Procedure: CORONARY ARTERY BYPASS GRAFTING (CABG) times 2 using left  Internal mammary artery to LAD, left greater saphenous vein - open harvest.;  Surgeon: [name], MD;  Location: [place];  Service: Open Heart Surgery;  Laterality: N/A;  . IR IMAGING GUIDED PORT INSERTION  12/08/2018  . LEFT HEART CATH AND CORONARY ANGIOGRAPHY N/A 09/27/2018   Procedure: LEFT HEART CATH AND CORONARY ANGIOGRAPHY;  Surgeon: Troy Sine, MD;  Location: [place] [facility];  Service: Cardiovascular;  Laterality: N/A;  . LEFT HEART CATHETERIZATION WITH CORONARY ANGIOGRAM N/A 10/16/2014   Procedure: LEFT HEART CATHETERIZATION WITH CORONARY ANGIOGRAM;  Surgeon: Blane Ohara, MD;  Location: [facility] CATH LAB;  Service: Cardiovascular;  Laterality: N/A;  . Left L4-5 transforaminal lumbar interbody fusion with Depuy cage, rods and screws, local and allograft bone graft, Vivigen; bilateral decompression/partial hemilaminectomy lumbar five-sacral one  07/2017  . PERCUTANEOUS CORONARY ROTOBLATOR INTERVENTION (PCI-R) N/A 10/17/2014   Procedure: PERCUTANEOUS CORONARY ROTOBLATOR INTERVENTION (PCI-R);   Surgeon: Troy Sine, MD;  Location: [facility] CATH LAB;  Service: Cardiovascular;  Laterality: N/A;  . TEE WITHOUT CARDIOVERSION N/A 10/04/2018   Procedure: TRANSESOPHAGEAL ECHOCARDIOGRAM (TEE);  Surgeon: [name], MD;  Location: [place];  Service: Open Heart Surgery;  Laterality: N/A;  . VEIN LIGATION AND STRIPPING Left   . VIDEO BRONCHOSCOPY Bilateral 02/05/2015   Procedure: VIDEO BRONCHOSCOPY WITHOUT FLUORO;  Surgeon: Tanda Rockers, MD;  Location: [facility];  Service: Endoscopy;  Laterality: Bilateral;  . VULVA /PERINEUM BIOPSY  12-30-10   --epidermoid cyst    I have reviewed the social history and family history with the patient and they are unchanged from previous note.  ALLERGIES:  is allergic to fish-derived products; penicillins; aspirin; brilinta [ticagrelor]; codeine phosphate; and diphenhydramine hcl.  MEDICATIONS:  Current Outpatient Medications  Medication Sig Dispense Refill  . albuterol (PROAIR HFA) 108 (90 Base) MCG/ACT inhaler INHALE 1 TO 2 PUFFS BY MOUTH EVERY 4 HOURS AS NEEDED FOR WHEEZING 8.5 g 3  . atorvastatin (LIPITOR) 40 MG tablet Take 1 tablet (40 mg total) by mouth daily. 90 tablet 3  . bisoprolol (ZEBETA) 10 MG tablet TAKE 1 TABLET(10 MG) BY MOUTH DAILY 90 tablet 1  . budesonide-formoterol (SYMBICORT) 160-4.5 MCG/ACT inhaler INHALE 2 PUFFS BY MOUTH EVERY 12 HOURS 30.6 g 3  . capecitabine (XELODA) 500 MG tablet Take 2 caps every 12 hours, 7 days on and 7 days off 56 tablet 0  . cetirizine (ZYRTEC) 10 MG tablet Take 10 mg by mouth at  bedtime as needed for allergies.    . cholecalciferol 2000 units TABS Take 1 tablet (2,000 Units total) by mouth daily. 30 tablet 3  . clopidogrel (PLAVIX) 75 MG tablet TAKE 1 TABLET(75 MG) BY MOUTH DAILY 90 tablet 3  . dextromethorphan-guaiFENesin (MUCINEX DM) 30-600 MG per 12 hr tablet Take 1-2 tablets by mouth every 12 (twelve) hours as needed for cough (with flutter).     . famotidine (PEPCID) 20 MG tablet Take 20 mg by  mouth as needed.     . ferrous sulfate 325 (65 FE) MG tablet Take 650 mg by mouth daily with breakfast.     . fluticasone (FLONASE) 50 MCG/ACT nasal spray Place 1-2 sprays into both nostrils 2 (two) times daily as needed for allergies or rhinitis.    . furosemide (LASIX) 20 MG tablet Take 20 mg by mouth daily.    Marland Kitchen gabapentin (NEURONTIN) 100 MG capsule Take 1 capsule (100 mg total) by mouth 3 (three) times daily. Start at 118m at night first, and gradually increase to 3039mat night, and 10039mm and 100m61m over the next month 90 capsule 0  . lidocaine-prilocaine (EMLA) cream APPLY EXTERNALLY TO THE AFFECTED AREA ONCE DAILY AS DIRECTED 30 g 3  . magic mouthwash SOLN Take 5 mLs by mouth 4 (four) times daily. Leave Benadryl out of compound since pt allergic to it. Use hydrocortisone & nystatin. Swish & swallow or spit for thrush (Patient not taking: Reported on 02/28/2020) 240 mL 1  . magnesium oxide (MAGNESIUM-OXIDE) 400 (241.3 Mg) MG tablet Take 1 tablet (400 mg total) by mouth 3 (three) times daily. 90 tablet 2  . meclizine (ANTIVERT) 25 MG tablet TAKE 2 TABLETS BY MOUTH THREE TIMES DAILY AS NEEDED 24 tablet 2  . montelukast (SINGULAIR) 10 MG tablet TAKE 1 TABLET(10 MG) BY MOUTH AT BEDTIME 90 tablet 1  . Multiple Vitamin (MULTIVITAMIN) capsule Take 1 capsule by mouth daily.     . ondansetron (ZOFRAN) 8 MG tablet Take 1 tablet (8 mg total) by mouth every 8 (eight) hours as needed for nausea or vomiting. 30 tablet 1  . oxyCODONE (OXY IR/ROXICODONE) 5 MG immediate release tablet Take 1 tablet (5 mg total) by mouth every 8 (eight) hours as needed for severe pain. 45 tablet 0  . sacubitril-valsartan (ENTRESTO) 24-26 MG Take 1 tablet by mouth 2 (two) times daily. 180 tablet 3   No current facility-administered medications for this visit.    PHYSICAL EXAMINATION: ECOG PERFORMANCE STATUS: 1 - Symptomatic but completely ambulatory  Vitals:   03/06/20 0937  BP: 123/70  Pulse: 71  Resp: 18  Temp:  98.5 F (36.9 C)  SpO2: 99%   Filed Weights   03/06/20 0937  Weight: 116 lb 9.6 oz (52.9 kg)    Due to COVID19 we will limit examination to appearance. Patient had no complaints.  GENERAL:alert, no distress and comfortable SKIN: skin color normal, no rashes or significant lesions EYES: normal, Conjunctiva are pink and non-injected, sclera clear  NEURO: alert & oriented x 3 with fluent speech   LABORATORY DATA:  I have reviewed the data as listed CBC Latest Ref Rng & Units 03/06/2020 02/25/2020 02/18/2020  WBC 4.0 - 10.5 K/uL 4.2 4.8 4.0  Hemoglobin 12.0 - 15.0 g/dL 11.9(L) 12.5 12.4  Hematocrit 36.0 - 46.0 % 36.3 37.7 38.0  Platelets 150 - 400 K/uL 378 316 304     CMP Latest Ref Rng & Units 03/06/2020 02/26/2020 02/25/2020  Glucose 70 - 99  mg/dL 101(H) 97 100(H)  BUN 8 - 23 mg/dL 14 11 11   Creatinine 0.44 - 1.00 mg/dL 1.07(H) 1.02(H) 1.03(H)  Sodium 135 - 145 mmol/L 141 139 140  Potassium 3.5 - 5.1 mmol/L 4.2 4.5 3.9  Chloride 98 - 111 mmol/L 103 100 103  CO2 22 - 32 mmol/L 25 24 27   Calcium 8.9 - 10.3 mg/dL 9.7 9.6 9.8  Total Protein 6.5 - 8.1 g/dL 8.4(H) - 8.4(H)  Total Bilirubin 0.3 - 1.2 mg/dL 0.4 - 0.4  Alkaline Phos 38 - 126 U/L 55 - 56  AST 15 - 41 U/L 27 - 25  ALT 0 - 44 U/L 12 - 12      RADIOGRAPHIC STUDIES: I have personally reviewed the radiological images as listed and agreed with the findings in the report. No results found.   ASSESSMENT & PLAN:  Cassie Campbell is a 79 y.o. 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  -She is also onZometa q80monthstarting 01/26/19 -UnfortunatelyPET from 12/22/20showedsignificant progression of diffuse nodal metastasis, increased activity of  gallbladder mass, and increased size and metabolic activity of left ischial lesion. Cisplatin and gemcitabine was discontinued.  -She began Xeloda 1500 mg BID 1 week on/1 week off on 1/21, she tolerated poorly and developed n/v, weakness, and low po intake. She was able to slowly titrate up from low dose 5092mBID to 100026mID by C4 on 02/26/20.  -She has tolerated 1000m81mD dose increase well with no major side effects. Labs reviewed and overall adequate to continue treatment at same dose.  -Start C5 1000mg53m Xeloda 1 week on/1 week off on 3/23. Plan to PET scan in the next 1-2 months.  -I discussed the option of changing her Zometa infusion to Xgeva injection monthly which is better on her kidney function. She is interested in switching.  -F/u in 4 weeks with Xgeva   2.Left upper leg pain -she has left hipsurgeries in the past.  -Her CT left hip form 11/14/19 shows stable metastasis involving left ischium and progressed soft tissue around it. I discussed her pain is likely related to her cancer.  -PET on 12/22 showed significant increase in size and metabolic activity of the lesion and extraosseous component  -For her Pain she completedpalliative RT per Dr. MoodyDanley Danker8/20 to 01/08/20 -Pain and weakness resolved after radiation, does not currently require oxycodone anymore and uses cane still.    3. CAD, S/P CABGX2 on 09/27/2018, EF 25-30% -Currently on Plavix.  -f/u with Dr. KellyClaiborne Billingsent ECHO showed worsening EF  4. Asthma, dyspnea -Currently on Symbicort, Singulair, and albuterol as needed -f/u with pulmonary, stable, no recent flare -She has recently beenlittle more dyspneic. She can use dexa after chemo, and as needed for worsening wheezing  5.Goal of care discussion -The patient understands the goal of care is palliative. -she is full code for now  6. Mild Anemia -Secondary to chemoand her recent open heart surgery -I discussed her anemia may contribute  to her mild SOB. -She has not taken oral iron because it causes bloating. I encouraged her to take prenatal vitamin.  7. Weight loss -Her weight continues to trend down on Xeloda  -I discussed using high calorie and high protein diet to maintain weight. I offered her  dietician referral, she declined today.  -She does have postprandial stomach aches, otherwise no other pain. If more persistent I suggest she use her Pepcid more regularly.  -Her weight has been fluctuating lately. She is able to eat 3 small meals daily. I recommend she try Ensure Clear and she is interested in protein supplement.  -I offered her Dietician referral, she declined. I reviewed foods to increase in diet.   8. Elevated Cr  -Cr has been elevated since being on Xeloda.  -Cr 1.53 on 02/18/20. Cr improved to 1.07 today (03/06/20), stable now. Lasix has been held -I encouraged her to drink enough water to manage her heart function and her Cr level.    Plan -Labs reviewed and adequate to continue Xeloda 1022m BID, one week on and one week off, C5 starting 03/11/20 -Lab, flush, f/u and Xgeva in 4 weeks  -will order restaging scan on next visit    No problem-specific Assessment & Plan notes found for this encounter.   No orders of the defined types were placed in this encounter.  All questions were answered. The patient knows to call the clinic with any problems, questions or concerns. No barriers to learning was detected. The total time spent in the appointment was 30 minutes.     YTruitt Merle MD 03/06/2020   I, AJoslyn Devon am acting as scribe for YTruitt Merle MD.   I have reviewed the above documentation for accuracy and completeness, and I agree with the above.

## 2020-02-27 NOTE — Patient Instructions (Signed)
Hypomagnesemia Hypomagnesemia is a condition in which the level of magnesium in the blood is low. Magnesium is a mineral that is found in many foods. It is used in many different processes in the body. Hypomagnesemia can affect every organ in the body. In severe cases, it can cause life-threatening problems. What are the causes? This condition may be caused by:  Not getting enough magnesium in your diet.  Malnutrition.  Problems with absorbing magnesium from the intestines.  Dehydration.  Alcohol abuse.  Vomiting.  Severe or chronic diarrhea.  Some medicines, including medicines that make you urinate more (diuretics).  Certain diseases, such as kidney disease, diabetes, celiac disease, and overactive thyroid. What are the signs or symptoms? Symptoms of this condition include:  Loss of appetite.  Nausea and vomiting.  Involuntary shaking or trembling of a body part (tremor).  Muscle weakness.  Tingling in the arms and legs.  Sudden tightening of muscles (muscle spasms).  Confusion.  Psychiatric issues, such as depression, irritability, or psychosis.  A feeling of fluttering of the heart.  Seizures. These symptoms are more severe if magnesium levels drop suddenly. How is this diagnosed? This condition may be diagnosed based on:  Your symptoms and medical history.  A physical exam.  Blood and urine tests. How is this treated? Treatment depends on the cause and the severity of the condition. It may be treated with:  A magnesium supplement. This can be taken in pill form. If the condition is severe, magnesium is usually given through an IV.  Changes to your diet. You may be directed to eat foods that have a lot of magnesium, such as green leafy vegetables, peas, beans, and nuts.  Stopping any intake of alcohol. Follow these instructions at home:      Make sure that your diet includes foods with magnesium. Foods that have a lot of magnesium in them  include: ? Green leafy vegetables, such as spinach and broccoli. ? Beans and peas. ? Nuts and seeds, such as almonds and sunflower seeds. ? Whole grains, such as whole grain bread and fortified cereals.  Take magnesium supplements if your health care provider tells you to do that. Take them as directed.  Take over-the-counter and prescription medicines only as told by your health care provider.  Have your magnesium levels monitored as told by your health care provider.  When you are active, drink fluids that contain electrolytes.  Avoid drinking alcohol.  Keep all follow-up visits as told by your health care provider. This is important. Contact a health care provider if:  You get worse instead of better.  Your symptoms return. Get help right away if you:  Develop severe muscle weakness.  Have trouble breathing.  Feel that your heart is racing. Summary  Hypomagnesemia is a condition in which the level of magnesium in the blood is low.  Hypomagnesemia can affect every organ in the body.  Treatment may include eating more foods that contain magnesium, taking magnesium supplements, and not drinking alcohol.  Have your magnesium levels monitored as told by your health care provider. This information is not intended to replace advice given to you by your health care provider. Make sure you discuss any questions you have with your health care provider. Document Revised: 11/18/2017 Document Reviewed: 11/07/2017 Elsevier Patient Education  2020 Else[employer] Inc.    Rehydration, Adult Rehydration is the replacement of body fluids and salts and minerals (electrolytes) that are lost during dehydration. Dehydration is when there is not enough fluid   or water in the body. This happens when you lose more fluids than you take in. Common causes of dehydration include:  Vomiting.  Diarrhea.  Excessive sweating, such as from heat exposure or exercise.  Taking medicines that cause the  body to lose excess fluid (diuretics).  Impaired kidney function.  Not drinking enough fluid.  Certain illnesses or infections.  Certain poorly controlled long-term (chronic) illnesses, such as diabetes, heart disease, and kidney disease.  Symptoms of mild dehydration may include thirst, dry lips and mouth, dry skin, and dizziness. Symptoms of severe dehydration may include increased heart rate, confusion, fainting, and not urinating. You can rehydrate by drinking certain fluids or getting fluids through an IV tube, as told by your health care provider. What are the risks? Generally, rehydration is safe. However, one problem that can happen is taking in too much fluid (overhydration). This is rare. If overhydration happens, it can cause an electrolyte imbalance, kidney failure, or a decrease in salt (sodium) levels in the body. How to rehydrate Follow instructions from your health care provider for rehydration. The kind of fluid you should drink and the amount you should drink depend on your condition.  If directed by your health care provider, drink an oral rehydration solution (ORS). This is a drink designed to treat dehydration that is found in pharmacies and retail stores. ? Make an ORS by following instructions on the package. ? Start by drinking small amounts, about  cup (120 mL) every 5-10 minutes. ? Slowly increase how much you drink until you have taken the amount recommended by your health care provider.  Drink enough clear fluids to keep your urine clear or pale yellow. If you were instructed to drink an ORS, finish the ORS first, then start slowly drinking other clear fluids. Drink fluids such as: ? Water. Do not drink only water. Doing that can lead to having too little sodium in your body (hyponatremia). ? Ice chips. ? Fruit juice that you have added water to (diluted juice). ? Low-calorie sports drinks.  If you are severely dehydrated, your health care provider may  recommend that you receive fluids through an IV tube in the hospital.  Do not take sodium tablets. Doing that can lead to the condition of having too much sodium in your body (hypernatremia). Eating while you rehydrate Follow instructions from your health care provider about what to eat while you rehydrate. Your health care provider may recommend that you slowly begin eating regular foods in small amounts.  Eat foods that contain a healthy balance of electrolytes, such as bananas, oranges, potatoes, tomatoes, and spinach.  Avoid foods that are greasy or contain a lot of fat or sugar.  In some cases, you may get nutrition through a feeding tube that is passed through your nose and into your stomach (nasogastric tube, or NG tube). This may be done if you have uncontrolled vomiting or diarrhea. Beverages to avoid Certain beverages may make dehydration worse. While you rehydrate, avoid:  Alcohol.  Caffeine.  Drinks that contain a lot of sugar. These include: ? High-calorie sports drinks. ? Fruit juice that is not diluted. ? Soda.  Check nutrition labels to see how much sugar or caffeine a beverage contains. Signs of dehydration recovery You may be recovering from dehydration if:  You are urinating more often than before you started rehydrating.  Your urine is clear or pale yellow.  Your energy level improves.  You vomit less frequently.  You have diarrhea less frequently.    Your appetite improves or returns to normal.  You feel less dizzy or less light-headed.  Your skin tone and color start to look more normal. Contact a health care provider if:  You continue to have symptoms of mild dehydration, such as: ? Thirst. ? Dry lips. ? Slightly dry mouth. ? Dry, warm skin. ? Dizziness.  You continue to vomit or have diarrhea. Get help right away if:  You have symptoms of dehydration that get worse.  You feel: ? Confused. ? Weak. ? Like you are going to faint.  You  have not urinated in 6-8 hours.  You have very dark urine.  You have trouble breathing.  Your heart rate while sitting still is over 100 beats a minute.  You cannot drink fluids without vomiting.  You have vomiting or diarrhea that: ? Gets worse. ? Does not go away.  You have a fever. This information is not intended to replace advice given to you by your health care provider. Make sure you discuss any questions you have with your health care provider. Document Revised: 11/18/2017 Document Reviewed: 01/30/2016 Elsevier Patient Education  2020 Elsevier Inc.  

## 2020-02-28 ENCOUNTER — Ambulatory Visit (INDEPENDENT_AMBULATORY_CARE_PROVIDER_SITE_OTHER): Payer: Medicare Other | Admitting: Pharmacist Clinician (PhC)/ Clinical Pharmacy Specialist

## 2020-02-28 DIAGNOSIS — I5020 Unspecified systolic (congestive) heart failure: Secondary | ICD-10-CM | POA: Diagnosis not present

## 2020-02-28 MED ORDER — ATORVASTATIN CALCIUM 40 MG PO TABS: 40.0000 mg | ORAL_TABLET | Freq: Every day | ORAL | 3 refills | Status: AC

## 2020-02-28 MED ORDER — SACUBITRIL-VALSARTAN 24-26 MG PO TABS: 1.0000 | ORAL_TABLET | Freq: Two times a day (BID) | ORAL | 3 refills | Status: AC

## 2020-02-28 NOTE — Progress Notes (Signed)
03/01/2020 Cassie Campbell February 03, 1941 [id number]   HPI:  Cassie Campbell is a 79 y.o. female patient of Dr Claiborne Billings, with a PMH below who presents today for heart failure medication titration.  Cassie Campbell saw Dr. Claiborne Billings on Feb 26.  He discontinued her losartan and furosemide, and started her on Entresto 24/26 mg bid.  Today she returns to follow up for titration in Entresto dose.    She notes that she has been feeling well, no side effects or concerns about the Entresto at this time.  She does note some increase in her ability to perform ADLs.    Past Medical History:, CABG x 2 ASCVD S/p NSTEMI (2015);  CABG x 2 (2019)  hyperlipidemia 11/20:  TC 201, TG 147, HDL 52, LDL 123 - on atorvastatin 40  hypertension Well controlled, on bisoprolol, Entresto  Gall bladder cancer On Xeloda 1000 mg bid; palliative      Blood Pressure Goal:  130/80  Current Medications: Entresto 24/26 bid  Family Hx:  father had CHF, died around age 79; mother with hypertension, DM, died at 72 (complications); 1 sister deceased DM; 1 child passed with cancer, 1 living, no heart issues  Social Hx:  No, no, not a cofffee drinker regularly; no dark sodas  Diet:  No restaurant foods, only rare fried foods; mix of meats, eggs, cheeses, veggies, fruits; appetite not at its best due to cancer/chemotherapy.    Exercise:  Chair exercises at home most days  Home BP readings:  Mostly 99991111 systolic, diastolic mostly in the 0000000  Intolerances: aspirin, brillinta  Labs:  3/21:  Na 139, K 4.5, Glu 97, BUN 11, Scr 1.02, NT-ProBNP 951  Wt Readings from Last 3 Encounters:  02/28/20 120 lb (54.4 kg)  02/18/20 116 lb 4.8 oz (52.8 kg)  02/15/20 117 lb (53.1 kg)   BP Readings from Last 3 Encounters:  02/28/20 100/60  02/27/20 (!) 108/53  02/18/20 105/60   Pulse Readings from Last 3 Encounters:  02/28/20 70  02/27/20 66  02/18/20 79    Current Outpatient Medications  Medication Sig Dispense Refill  .  albuterol (PROAIR HFA) 108 (90 Base) MCG/ACT inhaler INHALE 1 TO 2 PUFFS BY MOUTH EVERY 4 HOURS AS NEEDED FOR WHEEZING 8.5 g 3  . atorvastatin (LIPITOR) 40 MG tablet Take 1 tablet (40 mg total) by mouth daily. 90 tablet 3  . bisoprolol (ZEBETA) 10 MG tablet TAKE 1 TABLET(10 MG) BY MOUTH DAILY 90 tablet 1  . budesonide-formoterol (SYMBICORT) 160-4.5 MCG/ACT inhaler INHALE 2 PUFFS BY MOUTH EVERY 12 HOURS 30.6 g 3  . capecitabine (XELODA) 500 MG tablet Take 2 caps every 12 hours, 7 days on and 7 days off 56 tablet 0  . cetirizine (ZYRTEC) 10 MG tablet Take 10 mg by mouth at bedtime as needed for allergies.    . cholecalciferol 2000 units TABS Take 1 tablet (2,000 Units total) by mouth daily. 30 tablet 3  . clopidogrel (PLAVIX) 75 MG tablet TAKE 1 TABLET(75 MG) BY MOUTH DAILY 90 tablet 3  . dextromethorphan-guaiFENesin (MUCINEX DM) 30-600 MG per 12 hr tablet Take 1-2 tablets by mouth every 12 (twelve) hours as needed for cough (with flutter).     . famotidine (PEPCID) 20 MG tablet Take 20 mg by mouth as needed.     . ferrous sulfate 325 (65 FE) MG tablet Take 650 mg by mouth daily with breakfast.     . fluticasone (FLONASE) 50 MCG/ACT nasal spray Place 1-2  sprays into both nostrils 2 (two) times daily as needed for allergies or rhinitis.    Marland Kitchen gabapentin (NEURONTIN) 100 MG capsule Take 1 capsule (100 mg total) by mouth 3 (three) times daily. Start at 100mg  at night first, and gradually increase to 300mg  at night, and 100mg  am and 100mg  pm over the next month 90 capsule 0  . lidocaine-prilocaine (EMLA) cream APPLY EXTERNALLY TO THE AFFECTED AREA ONCE DAILY AS DIRECTED 30 g 3  . magnesium oxide (MAGNESIUM-OXIDE) 400 (241.3 Mg) MG tablet Take 1 tablet (400 mg total) by mouth 3 (three) times daily. 90 tablet 2  . meclizine (ANTIVERT) 25 MG tablet TAKE 2 TABLETS BY MOUTH THREE TIMES DAILY AS NEEDED 24 tablet 2  . montelukast (SINGULAIR) 10 MG tablet TAKE 1 TABLET(10 MG) BY MOUTH AT BEDTIME 90 tablet 1  .  Multiple Vitamin (MULTIVITAMIN) capsule Take 1 capsule by mouth daily.     . ondansetron (ZOFRAN) 8 MG tablet Take 1 tablet (8 mg total) by mouth every 8 (eight) hours as needed for nausea or vomiting. 30 tablet 1  . oxyCODONE (OXY IR/ROXICODONE) 5 MG immediate release tablet Take 1 tablet (5 mg total) by mouth every 8 (eight) hours as needed for severe pain. 45 tablet 0  . sacubitril-valsartan (ENTRESTO) 24-26 MG Take 1 tablet by mouth 2 (two) times daily. 180 tablet 3  . magic mouthwash SOLN Take 5 mLs by mouth 4 (four) times daily. Leave Benadryl out of compound since pt allergic to it. Use hydrocortisone & nystatin. Swish & swallow or spit for thrush (Patient not taking: Reported on 02/28/2020) 240 mL 1   No current facility-administered medications for this visit.    Allergies  Allergen Reactions  . Fish-Derived Products Shortness Of Breath, Swelling and Rash  . Penicillins Shortness Of Breath, Swelling and Rash    PATIENT HAS HAD A PCN REACTION WITH IMMEDIATE RASH, FACIAL/TONGUE/THROAT SWELLING, SOB, OR LIGHTHEADEDNESS WITH HYPOTENSION:  #  #  #  YES  #  #  #   Has patient had a PCN reaction causing severe rash involving mucus membranes or skin necrosis: No PATIENT HAS HAD A PCN REACTION THAT REQUIRED HOSPITALIZATION:  #  #  #  YES  #  #  #   Has patient had a PCN reaction occurring within the last 10 years: No    . Aspirin Swelling and Rash    SWELLING REACTION UNSPECIFIED   . Brilinta [Ticagrelor] Rash    Started Brilinta 09/2014 - rash - unsure which it is directly related to  . Codeine Phosphate Nausea Only  . Diphenhydramine Hcl Rash    Past Medical History:  Diagnosis Date  . Allergic rhinitis   . Arthritis   . Asthma    xolair s 8/05 ?11/07; mastered hfa 12/20/08  . Benign positional vertigo   . CAD (coronary artery disease)    a. 09/2014 NSTEMI s/p LHC with sig 2V dz. dLAD diffusely diseased and not suitable for PCI. unsuccessful RCA PCI d/t heavy calcifications  .  gallbladder ca dx'd 10/2018  . GERD (gastroesophageal reflux disease)   . Heart attack ([place])    09/2014  . Hyperlipidemia    <130 ldl pos fm hx, bp  . Hypertension   . Osteopenia    dexa 08/22/07 AP spine + 1.1, left femur -1.3, right femur -.8; dexa 10/06/09 +1.6, left femur =1.6, right femur -.  . PONV (postoperative nausea and vomiting)   . Ruptured disc, cervical   .  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 rate 15, height 4\' 11"  (1.499 m), weight 120 lb (54.4 kg), last menstrual period 12/21/1999, SpO2 98 %.  HFrEF (heart failure with reduced ejection fraction) ([place]) Patient with HFrEF, latest echo showed 25-30% with severe LV dysfunction and grade 3 diastolic dysfunction.  She is tolerating the Entresto well, notes some improvement in her activities of daily living.  Her blood pressure is low normal at 100/60 today, not much different than the readings she gets at home.  We don't have any room to increase the dose of Entresto, thus will leave her at the 24/26 mg dose.  Patient aware to contact office should her BP drop further or become symptomatically hypotensive.   Tommy Medal PharmD CPP [facility] [address] [place], [address] [address] [phone]

## 2020-02-28 NOTE — Patient Instructions (Signed)
If you have any problems getting the Entresto paid for, please give [place] a call.  Gregory Dowe/Raquel at [phone]  Your blood pressure today is 100/60  Check your blood pressure at home 2-3 times per week and keep record of the readings.  Take your meds as follows:  Continue with all current medications  Exercise as you're able, try to walk approximately 30 minutes per day.  Keep salt intake to a minimum, especially watch canned and prepared boxed foods.  Eat more fresh fruits and vegetables and fewer canned items.  Avoid eating in fast food restaurants.    HOW TO TAKE YOUR BLOOD PRESSURE: . Rest 5 minutes before taking your blood pressure. .  Don't smoke or drink caffeinated beverages for at least 30 minutes before. . Take your blood pressure before (not after) you eat. . Sit comfortably with your back supported and both feet on the floor (don't cross your legs). . Elevate your arm to heart level on a table or a desk. . Use the proper sized cuff. It should fit smoothly and snugly around your bare upper arm. There should be enough room to slip a fingertip under the cuff. The bottom edge of the cuff should be 1 inch above the crease of the elbow. . Ideally, take 3 measurements at one sitting and record the average.

## 2020-03-01 ENCOUNTER — Encounter: Payer: Self-pay | Admitting: Pharmacist Clinician (PhC)/ Clinical Pharmacy Specialist

## 2020-03-01 DIAGNOSIS — I5020 Unspecified systolic (congestive) heart failure: Secondary | ICD-10-CM | POA: Insufficient documentation

## 2020-03-01 NOTE — Assessment & Plan Note (Signed)
Patient with HFrEF, latest echo showed 25-30% with severe LV dysfunction and grade 3 diastolic dysfunction.  She is tolerating the Entresto well, notes some improvement in her activities of daily living.  Her blood pressure is low normal at 100/60 today, not much different than the readings she gets at home.  We don't have any room to increase the dose of Entresto, thus will leave her at the 24/26 mg dose.  Patient aware to contact office should her BP drop further or become symptomatically hypotensive.

## 2020-03-04 ENCOUNTER — Telehealth: Payer: Self-pay | Admitting: Cardiovascular Disease

## 2020-03-04 NOTE — Telephone Encounter (Signed)
Called patient, gave RESULTS.  Patient verbalized understanding, she states she is SOB- but I stated that with her EF being low- that can cause some SOB, but to monitor and if it get worse to call back.  Patient stated she would.

## 2020-03-04 NOTE — Telephone Encounter (Signed)
Patient was returning a call to go over her lab results

## 2020-03-06 ENCOUNTER — Encounter: Payer: Self-pay | Admitting: Hematology

## 2020-03-06 ENCOUNTER — Inpatient Hospital Stay: Payer: Medicare Other

## 2020-03-06 ENCOUNTER — Inpatient Hospital Stay (HOSPITAL_BASED_OUTPATIENT_CLINIC_OR_DEPARTMENT_OTHER): Payer: Medicare Other | Admitting: Hematology

## 2020-03-06 ENCOUNTER — Telehealth: Payer: Self-pay | Admitting: Hematology

## 2020-03-06 ENCOUNTER — Other Ambulatory Visit: Payer: Self-pay

## 2020-03-06 VITALS — BP 123/70 | HR 71 | Temp 98.5°F | Resp 18 | Ht 59.0 in | Wt 116.6 lb

## 2020-03-06 DIAGNOSIS — C23 Malignant neoplasm of gallbladder: Secondary | ICD-10-CM

## 2020-03-06 DIAGNOSIS — I10 Essential (primary) hypertension: Secondary | ICD-10-CM | POA: Diagnosis not present

## 2020-03-06 LAB — CMP (CANCER CENTER ONLY)
ALT: 12 U/L (ref 0–44)
AST: 27 U/L (ref 15–41)
Albumin: 3.5 g/dL (ref 3.5–5.0)
Alkaline Phosphatase: 55 U/L (ref 38–126)
Anion gap: 13 (ref 5–15)
BUN: 14 mg/dL (ref 8–23)
CO2: 25 mmol/L (ref 22–32)
Calcium: 9.7 mg/dL (ref 8.9–10.3)
Chloride: 103 mmol/L (ref 98–111)
Creatinine: 1.07 mg/dL — ABNORMAL HIGH (ref 0.44–1.00)
GFR, Est AFR Am: 58 mL/min — ABNORMAL LOW (ref >60)
GFR, Estimated: 50 mL/min — ABNORMAL LOW (ref >60)
Glucose, Bld: 101 mg/dL — ABNORMAL HIGH (ref 70–99)
Potassium: 4.2 mmol/L (ref 3.5–5.1)
Sodium: 141 mmol/L (ref 135–145)
Total Bilirubin: 0.4 mg/dL (ref 0.3–1.2)
Total Protein: 8.4 g/dL — ABNORMAL HIGH (ref 6.5–8.1)

## 2020-03-06 LAB — CBC WITH DIFFERENTIAL (CANCER CENTER ONLY)
Abs Immature Granulocytes: 0.01 10*3/uL (ref 0.00–0.07)
Basophils Absolute: 0 10*3/uL (ref 0.0–0.1)
Basophils Relative: 1 %
Eosinophils Absolute: 0.1 10*3/uL (ref 0.0–0.5)
Eosinophils Relative: 2 %
HCT: 36.3 % (ref 36.0–46.0)
Hemoglobin: 11.9 g/dL — ABNORMAL LOW (ref 12.0–15.0)
Immature Granulocytes: 0 %
Lymphocytes Relative: 18 %
Lymphs Abs: 0.8 10*3/uL (ref 0.7–4.0)
MCH: 32.2 pg (ref 26.0–34.0)
MCHC: 32.8 g/dL (ref 30.0–36.0)
MCV: 98.1 fL (ref 80.0–100.0)
Monocytes Absolute: 0.4 10*3/uL (ref 0.1–1.0)
Monocytes Relative: 11 %
Neutro Abs: 2.9 10*3/uL (ref 1.7–7.7)
Neutrophils Relative %: 68 %
Platelet Count: 378 10*3/uL (ref 150–400)
RBC: 3.7 MIL/uL — ABNORMAL LOW (ref 3.87–5.11)
RDW: 18.2 % — ABNORMAL HIGH (ref 11.5–15.5)
WBC Count: 4.2 10*3/uL (ref 4.0–10.5)
nRBC: 0 % (ref 0.0–0.2)

## 2020-03-06 LAB — MAGNESIUM: Magnesium: 1.7 mg/dL (ref 1.7–2.4)

## 2020-03-06 NOTE — Telephone Encounter (Signed)
Scheduled appts per 3/18 los. Pt declined AVS. I gave pt a print out of appt calendar.

## 2020-03-07 ENCOUNTER — Ambulatory Visit: Payer: Medicare Other | Admitting: Physician Assistant

## 2020-03-11 ENCOUNTER — Ambulatory Visit: Payer: Medicare Other | Admitting: Cardiovascular Disease

## 2020-03-18 ENCOUNTER — Other Ambulatory Visit: Payer: Self-pay | Admitting: Hematology

## 2020-03-20 MED FILL — CAPECITABINE 500 MG TABLET: 500 | 14 days supply | Qty: 40 | Fill #0

## 2020-03-28 NOTE — Progress Notes (Signed)
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 Lymph node, biopsy, mediastinal - LYMPH NODE WITH METASTATIC ADENOCARCINOMA. - SEE MICROSCOPIC DESCRIPTION 2. Plaque, coronary artery - CALCIFIED ATHEROSCLEROTIC PLAQUE.   10/14/2018 Miscellaneous   Foundation One:  MSI stable tumor mutation burden 3Muts/mb ERBB2 amplification CCNE1 amplification TP53 mutation(+) CDK6 amplification  HGF amplification    11/13/2018 Cancer Staging   Staging form: Gallbladder, AJCC 8th Edition - Clinical stage from 11/13/2018: Stage IVB (cTX, cN2, pM1) - Signed by Truitt Merle, MD on 12/15/2018   11/17/2018 Imaging   11/17/2018 CT CAP IMPRESSION: 1. 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 This is associated with extensive upper abdominal and retroperitoneal lymphadenopathy, as well as metastatic lymphadenopathy in the posterior mediastinum and left supraclavicular region. There is also a metastatic lesion to the left ischium. 2. 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 3. Aortic atherosclerosis, in addition to left main and 3 vessel coronary artery disease. Status post median sternotomy for CABG including LIMA to the LAD. 4. There are calcifications of the aortic valve. Echocardiographic correlation for evaluation  of potential valvular dysfunction may be warranted if clinically indicated.    11/21/2018 Initial Diagnosis   Gallbladder cancer ([place])   11/27/2018 Pathology Results   11/27/2018 CA19-9 immunohistochemical stain Per request, a CA19-9 immunohistochemical stain was performed at an outside institution revealing positive staining in the tumor cells.    12/15/2018 - 11/30/2019 Chemotherapy   First line chemo cisplatin and gemcitabine every 2 weeks on 12/15/18. Had 1 month chemo break in April, restarted on 04/20/19. Stopped 11/30/19 due to disease progression.    01/26/2019 -  Chemotherapy   Zometa q47month starting 01/26/19-11/02/19. Switched to monthly Xgeva injections on 04/03/20   02/20/2019 PET scan   Restaging PET scan:  IMPRESSION: 1. Positive response to therapy at all primary and metastatic sites. Persistent primary and metastatic lesions do have intense hypermetabolic activity albeit reduced. 2. Decrease in size and hypermetabolic activity of mass lesion in the gallbladder fundus. 3. Interval decrease in size and metabolic activity of periportal adenopathy. 4. Decrease in size and metabolic activity of periaortic lymph nodes. 5. Interval decrease in size and metabolic activity of destructive lesion in the LEFT inferior pubic ramus with new sclerosis. 6. No new or progressive disease.   05/29/2019 Imaging   CT CAP W Contrast  IMPRESSION: Known gallbladder adenocarcinoma, difficult to compare to recent PET, improved from prior CT.  Upper abdominal/retroperitoneal lymphadenopathy, slightly improved from prior PET.  Osseous metastasis involving the left inferior pubic ramus, grossly unchanged.  No evidence of metastatic disease in the chest.  Additional ancillary findings as above.   08/22/2019 Imaging   CT CAP W Contrast  IMPRESSION: 1. Stable soft tissue mass involving the gallbladder fundus. 2. Stable mild porta hepatis and retroperitoneal lymphadenopathy. 3. Stable  sclerotic bone metastases. 4. No new or progressive metastatic disease identified.  5. Stable 2.3 cm left thyroid lobe nodule.   11/14/2019 Imaging   CT Left Hip WO  contrast  IMPRESSION: 1. 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 Associated soft tissue components have progressed, but there is no evidence of pathologic fracture. 2. No acute osseous findings. 3. 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   12/11/2019 PET scan   IMPRESSION: 1. Significant progression of malignancy, with increased activity in the gallbladder mass; considerable increase in size and activity of porta hepatis, retroperitoneal, and upper pelvic adenopathy; significant increase in size and metabolic activity of the left ischial metastatic lesion with extensive extraosseous component; and new mildly hypermetabolic left supraclavicular and thoracic periaortic lymph nodes suspicious for malignant involvement. 2. Faintly increased activity in a left thyroid nodule which was not previously hypermetabolic. Given the relatively low-grade activity, this may be amenable to surveillance, but a significant minority of thyroid nodules with hypermetabolic activity can represent thyroid cancer. 3. Other imaging findings of potential clinical significance: Chronic paranasal sinusitis. Chronic right middle lobe atelectasis. Aortic Atherosclerosis (ICD10-I70.0). Coronary atherosclerosis with cardiomegaly.   12/17/2019 - 01/07/2020 Radiation Therapy   Palliative Radiation with Dr. Lisbeth Renshaw 12/17/19-01/07/20   01/10/2020 -  Chemotherapy   Xeloda 1500 mg BID 1 week on/1 week off starting on 01/10/20, she tolerated poorly and developed n/v, weakness, and low po intake. She was instructed to dose reduce to 500 mg BID which she continued into C2. Increased to 1067m Am and 5030mPM on 02/12/20 and increased to 100031mID starting 02/26/20 starting with C4.       CURRENT THERAPY:  -Zometa q3mo66monthrting  01/26/19-11/02/19. Switched to monthly Xgeva injections on 04/03/20.  -Xeloda 1500 mg BID 1 week on/1 week off starting on 01/10/20, she tolerated poorly and developed n/v, weakness, and low po intake. She was instructed to dose reduce to 500 mg BID which she continued into C2.Increased to 1000mg80mand 500mg 20mn 02/12/20 and increased to 1000mg B58mtarting 02/26/20 starting with C4.    INTERVAL HISTORY:  KathleeCALANDRIA MULLINGSe for a follow up and treatment. She presents to the clinic alone. She notes she is doing well. She is tolerating Xeloda well with darkness of palms. She denies neuropathy. She notes she is currently on her off week of treatment.     REVIEW OF SYSTEMS:   Constitutional: Denies fevers, chills or abnormal weight loss Eyes: Denies blurriness of vision Ears, nose, mouth, throat, and face: Denies mucositis or sore throat Respiratory: Denies cough, dyspnea or wheezes Cardiovascular: Denies palpitation, chest discomfort or lower extremity swelling Gastrointestinal:  Denies nausea, heartburn or change in bowel habits Skin: (+) Dry skin (+) Skin darkness of palms Lymphatics: Denies new lymphadenopathy or easy bruising Neurological:Denies numbness, tingling or new weaknesses Behavioral/Psych: Mood is stable, no new changes  All other systems were reviewed with the patient and are negative.  MEDICAL HISTORY:  Past Medical History:  Diagnosis Date  . Allergic rhinitis   . Arthritis   . Asthma    xolair s 8/05 ?11/07; mastered hfa 12/20/08  . Benign positional vertigo   . CAD (coronary artery disease)    a. 09/2014 NSTEMI s/p LHC with sig 2V dz. dLAD diffusely diseased and not suitable for PCI. unsuccessful RCA PCI d/t heavy calcifications  . gallbladder ca dx'd 10/2018  . GERD (gastroesophageal reflux disease)   . Heart attack (HCC)   [place]/2015  . Hyperlipidemia    <130 ldl pos fm hx, bp  . Hypertension   .  Osteopenia    dexa 08/22/07 AP spine + 1.1, left femur -1.3,  right femur -.8; dexa 10/06/09 +1.6, left femur =1.6, right femur -.  . PONV (postoperative nausea and vomiting)   . Ruptured disc, cervical   . 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 BREAST SURGERY  10-26-10   Rt. breast bx--for microcalcifications in rt. retroareolar region--dx was hyalinized fibroadenoma  . BUNIONECTOMY Bilateral   . CARDIAC CATHETERIZATION     2015  . CATARACT EXTRACTION Right 02/02/2018   Dr Satira Sark  . CATARACT EXTRACTION Left 03/30/2018  . [place] SURGERY  12/01  . CORONARY ARTERY BYPASS GRAFT N/A 10/04/2018   Procedure: CORONARY ARTERY BYPASS GRAFTING (CABG) times 2 using left  Internal mammary artery to LAD, left greater saphenous vein - open harvest.;  Surgeon: [name], MD;  Location: [place];  Service: Open Heart Surgery;  Laterality: N/A;  . IR IMAGING GUIDED PORT INSERTION  12/08/2018  . LEFT HEART CATH AND CORONARY ANGIOGRAPHY N/A 09/27/2018   Procedure: LEFT HEART CATH AND CORONARY ANGIOGRAPHY;  Surgeon: Troy Sine, MD;  Location: [place] [facility];  Service: Cardiovascular;  Laterality: N/A;  . LEFT HEART CATHETERIZATION WITH CORONARY ANGIOGRAM N/A 10/16/2014   Procedure: LEFT HEART CATHETERIZATION WITH CORONARY ANGIOGRAM;  Surgeon: Blane Ohara, MD;  Location: [facility] C[facility] LAB;  Service: Cardiovascular;  Laterality: N/A;  . Left L4-5 transforaminal lumbar interbody fusion with Depuy cage, rods and screws, local and allograft bone graft, Vivigen; bilateral decompression/partial hemilaminectomy lumbar five-sacral one  07/2017  . PERCUTANEOUS CORONARY ROTOBLATOR INTERVENTION (PCI-R) N/A 10/17/2014   Procedure: PERCUTANEOUS CORONARY ROTOBLATOR INTERVENTION (PCI-R);  Surgeon: Troy Sine, MD;  Location[facility] C[facility] LAB;  Service: Cardiovascular;  Laterality: N/A;  . TEE WITHOUT CARDIOVERSION N/A 10/04/2018   Procedure: TRANSESOPHAGEAL  ECHOCARDIOGRAM (TEE);  Surgeon: [name], MD;  Location: [place];  Service: Open Heart Surgery;  Laterality: N/A;  . VEIN LIGATION AND STRIPPING Left   . VIDEO BRONCHOSCOPY Bilateral 02/05/2015   Procedure: VIDEO BRONCHOSCOPY WITHOUT FLUORO;  Surgeon: Tanda Rockers, MD;  Location: [facility];  Service: Endoscopy;  Laterality: Bilateral;  . VULVA /PERINEUM BIOPSY  12-30-10   --epidermoid cyst    I have reviewed the social history and family history with the patient and they are unchanged from previous note.  ALLERGIES:  is allergic to fish-derived products; penicillins; aspirin; brilinta [ticagrelor]; codeine phosphate; and diphenhydramine hcl.  MEDICATIONS:  Current Outpatient Medications  Medication Sig Dispense Refill  . albuterol (PROAIR HFA) 108 (90 Base) MCG/ACT inhaler INHALE 1 TO 2 PUFFS BY MOUTH EVERY 4 HOURS AS NEEDED FOR WHEEZING 8.5 g 3  . atorvastatin (LIPITOR) 40 MG tablet Take 1 tablet (40 mg total) by mouth daily. 90 tablet 3  . bisoprolol (ZEBETA) 10 MG tablet TAKE 1 TABLET(10 MG) BY MOUTH DAILY 90 tablet 2  . budesonide-formoterol (SYMBICORT) 160-4.5 MCG/ACT inhaler INHALE 2 PUFFS BY MOUTH EVERY 12 HOURS 30.6 g 3  . capecitabine (XELODA) 500 MG tablet TAKE 2 TABLETS BY MOUTH EVERY 12 HOURS, 7 DAYS ON AND 7 DAYS OFF 56 tablet 0  . cetirizine (ZYRTEC) 10 MG tablet Take 10 mg by mouth at bedtime as needed for allergies.    . cholecalciferol 2000 units TABS Take 1 tablet (2,000 Units total) by mouth daily. 30 tablet 3  . clopidogrel (PLAVIX) 75 MG tablet TAKE 1 TABLET(75 MG)  BY MOUTH DAILY 90 tablet 3  . dextromethorphan-guaiFENesin (MUCINEX DM) 30-600 MG per 12 hr tablet Take 1-2 tablets by mouth every 12 (twelve) hours as needed for cough (with flutter).     . famotidine (PEPCID) 20 MG tablet Take 20 mg by mouth as needed.     . ferrous sulfate 325 (65 FE) MG tablet Take 650 mg by mouth daily with breakfast.     . fluticasone (FLONASE) 50 MCG/ACT nasal spray Place  1-2 sprays into both nostrils 2 (two) times daily as needed for allergies or rhinitis.    Marland Kitchen lidocaine-prilocaine (EMLA) cream APPLY EXTERNALLY TO THE AFFECTED AREA ONCE DAILY AS DIRECTED 30 g 3  . magic mouthwash SOLN Take 5 mLs by mouth 4 (four) times daily. Leave Benadryl out of compound since pt allergic to it. Use hydrocortisone & nystatin. Swish & swallow or spit for thrush (Patient not taking: Reported on 02/28/2020) 240 mL 1  . magnesium oxide (MAGNESIUM-OXIDE) 400 (241.3 Mg) MG tablet Take 1 tablet (400 mg total) by mouth 3 (three) times daily. 90 tablet 2  . meclizine (ANTIVERT) 25 MG tablet TAKE 2 TABLETS BY MOUTH THREE TIMES DAILY AS NEEDED 24 tablet 2  . montelukast (SINGULAIR) 10 MG tablet TAKE 1 TABLET(10 MG) BY MOUTH AT BEDTIME 90 tablet 1  . Multiple Vitamin (MULTIVITAMIN) capsule Take 1 capsule by mouth daily.     . ondansetron (ZOFRAN) 8 MG tablet Take 1 tablet (8 mg total) by mouth every 8 (eight) hours as needed for nausea or vomiting. 30 tablet 1  . oxyCODONE (OXY IR/ROXICODONE) 5 MG immediate release tablet Take 1 tablet (5 mg total) by mouth every 8 (eight) hours as needed for severe pain. 45 tablet 0  . sacubitril-valsartan (ENTRESTO) 24-26 MG Take 1 tablet by mouth 2 (two) times daily. 180 tablet 3   No current facility-administered medications for this visit.    PHYSICAL EXAMINATION: ECOG PERFORMANCE STATUS: 1 - Symptomatic but completely ambulatory  Vitals:   04/03/20 0910  BP: 110/67  Pulse: 67  Resp: 17  Temp: 98.3 F (36.8 C)  SpO2: 100%   Filed Weights   04/03/20 0910  Weight: 118 lb 6.4 oz (53.7 kg)    Due to COVID19 we will limit examination to appearance. Patient had no complaints.  GENERAL:alert, no distress and comfortable SKIN: skin color normal, no rashes or significant lesions (+) Dry Skin and skin darkness of palms EYES: normal, Conjunctiva are pink and non-injected, sclera clear  NEURO: alert & oriented x 3 with fluent speech   LABORATORY  DATA:  I have reviewed the data as listed CBC Latest Ref Rng & Units 04/03/2020 03/06/2020 02/25/2020  WBC 4.0 - 10.5 K/uL 4.4 4.2 4.8  Hemoglobin 12.0 - 15.0 g/dL 11.2(L) 11.9(L) 12.5  Hematocrit 36.0 - 46.0 % 34.1(L) 36.3 37.7  Platelets 150 - 400 K/uL 324 378 316     CMP Latest Ref Rng & Units 04/03/2020 03/06/2020 02/26/2020  Glucose 70 - 99 mg/dL 103(H) 101(H) 97  BUN 8 - 23 mg/dL _0 Creatinine 0.44 - 1.00 mg/dL 1.17(H) 1.07(H) 1.02(H)  Sodium 135 - 145 mmol/L 139 141 139  Potassium 3.5 - 5.1 mmol/L 4.1 4.2 4.5  Chloride 98 - 111 mmol/L 105 103 100  CO2 22 - 32 mmol/L _1 Calcium 8.9 - 10.3 mg/dL 9.4 9.7 9.6  Total Protein 6.5 - 8.1 g/dL 8.3(H) 8.4(H) -  Total Bilirubin 0.3 - 1.2 mg/dL 0.4 0.4 -  Alkaline Phos 38 - 126 U/L 57 55 -  AST 15 - 41 U/L 23 27 -  ALT 0 - 44 U/L 12 12 -      RADIOGRAPHIC STUDIES: I have personally reviewed the radiological images as listed and agreed with the findings in the report. No results found.   ASSESSMENT & PLAN:  Cassie Campbell is a 79 y.o. 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  -She is also onZometa q76monthstarting 01/26/19. Switched to monthly Xgeva on 04/03/20 due to kidney function.  -UnfortunatelyPET from 12/22/20showedsignificant progression of diffuse nodal metastasis, increased activity of gallbladder mass, and increased size and metabolic activity of left ischial lesion. Cisplatin and gemcitabine was discontinued.  -She began Xeloda 1500 mg BID 1 week on/1 week off on 1/21, she tolerated poorly and developed n/v, weakness, and low po intake. She was able to slowly titrate up from low dose 5055mBID to 100088mID by C4 on 02/26/20.  -S/p C6 she  continues to tolerate current dose well. She has mild skin darkness of palms and dry skin. No other skin or neurological toxicity.  -Labs reviewed, and overall adequate to proceed with Xeloda 1000m60mD 1 week on/1 week off. Plan to start C7 on 4/20. Also proceed with first Xgeva today.  -Her tumor marker did significantly decrease last month. Today's marker still pending. She is clinically doing well, I anticipate she is responding to chemo  -F/u with Xgeva and PET scan before OV in 4 weeks    2.Left upper leg pain -PET on 12/22 showed significant increase in size and metabolic activity of the lesion and extraosseous component. I previously discussed her pain is likely related to her cancer. -For her Pain she completedpalliative RT per Dr. MoodDanley Danker28/20 to 01/08/20 -Pain and weakness resolved after radiation, does not currently requireoxycodone anymoreand uses cane still.   3. CAD, S/P CABGX2 on 09/27/2018, EF25-30% -Currently on Plavix.  -f/u with Dr. KellClaiborne Billingscent ECHO showed worsening EF  4. Asthma, dyspnea -Currently on Symbicort, Singulair, and albuterol as needed -f/u with pulmonary, stable, no recent flare -She has recently beenlittle more dyspneic. She can use dexa after chemo, and as needed for worsening wheezing  5.Goal of care discussion -The patient understands the goal of care is palliative. -she is full code for now  6. Mild Anemia -Secondary to chemoand her recent open heart surgery -I discussed her anemia may contribute to her mild SOB. -She has not taken oral iron because it causes bloating. I encouraged her to take prenatal vitamin. -Previously normal, but mildly low lately. Hg at 11.2 today (04/03/20)  7. Weight loss -Her weight continues to trend down on Xeloda  -She does have postprandial stomach aches, otherwise no other pain. If more persistent I suggest she use her Pepcid more regularly. -I previously offered her Dietician  referral, she declined.  -Her weight has been fluctuating lately. She is able to eat 3 small meals daily. I recommend she try Ensure Clear and she is interested in protein supplement. I have discussed using high calorie and high protein diet to maintain weight.   8. Elevated Cr  -Cr has been elevated since being on Xeloda.  -Lasix has been held  -I encouraged her to drink enough water to  manage her heart function and her Cr level.  -Cr 1.17 today (04/03/20)   Plan -Proceed with Xgeva today and continue monthly  -Labs reviewed and adequate to continue Xeloda 105m BID, one week on and one week off, C7 starting 4/20 -f/u and Xgeva in 4 weeks with labs, flush and PET scan a few days before     No problem-specific Assessment & Plan notes found for this encounter.   Orders Placed This Encounter  Procedures  . NM PET Image Restag (PS) Skull Base To Thigh    Standing Status:   Future    Standing Expiration Date:   04/03/2021    Order Specific Question:   If indicated for the ordered procedure, I authorize the administration of a radiopharmaceutical per Radiology protocol    Answer:   Yes    Order Specific Question:   Preferred imaging location?    Answer:   [facility]   Order Specific Question:   Radiology Contrast Protocol - do NOT remove file path    Answer:   [email or web address]   All questions were answered. The patient knows to call the clinic with any problems, questions or concerns. No barriers to learning was detected. The total time spent in the appointment was 30 minutes.     YTruitt Merle MD 04/03/2020   I, AJoslyn Devon am acting as scribe for YTruitt Merle MD.   I have reviewed the above documentation for accuracy and completeness, and I agree with the above.

## 2020-03-31 ENCOUNTER — Other Ambulatory Visit: Payer: Self-pay | Admitting: Physician Assistant

## 2020-04-03 ENCOUNTER — Inpatient Hospital Stay: Payer: Medicare Other

## 2020-04-03 ENCOUNTER — Other Ambulatory Visit: Payer: Self-pay

## 2020-04-03 ENCOUNTER — Encounter: Payer: Self-pay | Admitting: Hematology

## 2020-04-03 ENCOUNTER — Inpatient Hospital Stay: Payer: Medicare Other | Attending: Obstetrics | Admitting: Hematology

## 2020-04-03 VITALS — BP 110/67 | HR 67 | Temp 98.3°F | Resp 17 | Ht 59.0 in | Wt 118.4 lb

## 2020-04-03 DIAGNOSIS — Z452 Encounter for adjustment and management of vascular access device: Secondary | ICD-10-CM | POA: Diagnosis not present

## 2020-04-03 DIAGNOSIS — D6481 Anemia due to antineoplastic chemotherapy: Secondary | ICD-10-CM | POA: Insufficient documentation

## 2020-04-03 DIAGNOSIS — C23 Malignant neoplasm of gallbladder: Secondary | ICD-10-CM | POA: Insufficient documentation

## 2020-04-03 DIAGNOSIS — C7951 Secondary malignant neoplasm of bone: Secondary | ICD-10-CM | POA: Diagnosis not present

## 2020-04-03 DIAGNOSIS — I2510 Atherosclerotic heart disease of native coronary artery without angina pectoris: Secondary | ICD-10-CM | POA: Diagnosis not present

## 2020-04-03 LAB — CBC WITH DIFFERENTIAL (CANCER CENTER ONLY)
Abs Immature Granulocytes: 0.01 10*3/uL (ref 0.00–0.07)
Basophils Absolute: 0 10*3/uL (ref 0.0–0.1)
Basophils Relative: 1 %
Eosinophils Absolute: 0.2 10*3/uL (ref 0.0–0.5)
Eosinophils Relative: 3 %
HCT: 34.1 % — ABNORMAL LOW (ref 36.0–46.0)
Hemoglobin: 11.2 g/dL — ABNORMAL LOW (ref 12.0–15.0)
Immature Granulocytes: 0 %
Lymphocytes Relative: 16 %
Lymphs Abs: 0.7 10*3/uL (ref 0.7–4.0)
MCH: 33.2 pg (ref 26.0–34.0)
MCHC: 32.8 g/dL (ref 30.0–36.0)
MCV: 101.2 fL — ABNORMAL HIGH (ref 80.0–100.0)
Monocytes Absolute: 0.4 10*3/uL (ref 0.1–1.0)
Monocytes Relative: 8 %
Neutro Abs: 3.1 10*3/uL (ref 1.7–7.7)
Neutrophils Relative %: 72 %
Platelet Count: 324 10*3/uL (ref 150–400)
RBC: 3.37 MIL/uL — ABNORMAL LOW (ref 3.87–5.11)
RDW: 18.8 % — ABNORMAL HIGH (ref 11.5–15.5)
WBC Count: 4.4 10*3/uL (ref 4.0–10.5)
nRBC: 0 % (ref 0.0–0.2)

## 2020-04-03 LAB — CMP (CANCER CENTER ONLY)
ALT: 12 U/L (ref 0–44)
AST: 23 U/L (ref 15–41)
Albumin: 3.4 g/dL — ABNORMAL LOW (ref 3.5–5.0)
Alkaline Phosphatase: 57 U/L (ref 38–126)
Anion gap: 9 (ref 5–15)
BUN: 17 mg/dL (ref 8–23)
CO2: 25 mmol/L (ref 22–32)
Calcium: 9.4 mg/dL (ref 8.9–10.3)
Chloride: 105 mmol/L (ref 98–111)
Creatinine: 1.17 mg/dL — ABNORMAL HIGH (ref 0.44–1.00)
GFR, Est AFR Am: 52 mL/min — ABNORMAL LOW (ref >60)
GFR, Estimated: 45 mL/min — ABNORMAL LOW (ref >60)
Glucose, Bld: 103 mg/dL — ABNORMAL HIGH (ref 70–99)
Potassium: 4.1 mmol/L (ref 3.5–5.1)
Sodium: 139 mmol/L (ref 135–145)
Total Bilirubin: 0.4 mg/dL (ref 0.3–1.2)
Total Protein: 8.3 g/dL — ABNORMAL HIGH (ref 6.5–8.1)

## 2020-04-03 LAB — MAGNESIUM: Magnesium: 1.9 mg/dL (ref 1.7–2.4)

## 2020-04-03 MED ORDER — DENOSUMAB 120 MG/1.7ML ~~LOC~~ SOLN
120.0000 mg | Freq: Once | SUBCUTANEOUS | Status: AC
  Administered 2020-04-03: 120 mg via SUBCUTANEOUS

## 2020-04-03 MED ORDER — DENOSUMAB 120 MG/1.7ML ~~LOC~~ SOLN
SUBCUTANEOUS | Status: AC
  Filled 2020-04-03: qty 1.7

## 2020-04-03 MED ORDER — CAPECITABINE 500 MG PO TABS: ORAL_TABLET | ORAL | 0 refills | Status: DC

## 2020-04-03 MED ORDER — SODIUM CHLORIDE 0.9% FLUSH
10.0000 mL | Freq: Once | INTRAVENOUS | Status: AC | PRN
  Administered 2020-04-03: 10 mL
  Filled 2020-04-03: qty 10

## 2020-04-03 MED ORDER — HEPARIN SOD (PORK) LOCK FLUSH 100 UNIT/ML IV SOLN
500.0000 [IU] | Freq: Once | INTRAVENOUS | Status: AC | PRN
  Administered 2020-04-03: 500 [IU]
  Filled 2020-04-03: qty 5

## 2020-04-03 NOTE — Patient Instructions (Signed)
Denosumab injection What is this medicine? DENOSUMAB (den oh sue mab) slows bone breakdown. Prolia is used to treat osteoporosis in women after menopause and in men, and in people who are taking corticosteroids for 6 months or more. Xgeva is used to treat a high calcium level due to cancer and to prevent bone fractures and other bone problems caused by multiple myeloma or cancer bone metastases. Xgeva is also used to treat giant cell tumor of the bone. This medicine may be used for other purposes; ask your health care provider or pharmacist if you have questions. COMMON BRAND NAME(S): Prolia, XGEVA What should I tell my health care provider before I take this medicine? They need to know if you have any of these conditions:  dental disease  having surgery or tooth extraction  infection  kidney disease  low levels of calcium or Vitamin D in the blood  malnutrition  on hemodialysis  skin conditions or sensitivity  thyroid or parathyroid disease  an unusual reaction to denosumab, other medicines, foods, dyes, or preservatives  pregnant or trying to get pregnant  breast-feeding How should I use this medicine? This medicine is for injection under the skin. It is given by a health care professional in a hospital or clinic setting. A special MedGuide will be given to you before each treatment. Be sure to read this information carefully each time. For Prolia, talk to your pediatrician regarding the use of this medicine in children. Special care may be needed. For Xgeva, talk to your pediatrician regarding the use of this medicine in children. While this drug may be prescribed for children as young as 13 years for selected conditions, precautions do apply. Overdosage: If you think you have taken too much of this medicine contact a poison control center or emergency room at once. NOTE: This medicine is only for you. Do not share this medicine with others. What if I miss a dose? It is  important not to miss your dose. Call your doctor or health care professional if you are unable to keep an appointment. What may interact with this medicine? Do not take this medicine with any of the following medications:  other medicines containing denosumab This medicine may also interact with the following medications:  medicines that lower your chance of fighting infection  steroid medicines like prednisone or cortisone This list may not describe all possible interactions. Give your health care provider a list of all the medicines, herbs, non-prescription drugs, or dietary supplements you use. Also tell them if you smoke, drink alcohol, or use illegal drugs. Some items may interact with your medicine. What should I watch for while using this medicine? Visit your doctor or health care professional for regular checks on your progress. Your doctor or health care professional may order blood tests and other tests to see how you are doing. Call your doctor or health care professional for advice if you get a fever, chills or sore throat, or other symptoms of a cold or flu. Do not treat yourself. This drug may decrease your body's ability to fight infection. Try to avoid being around people who are sick. You should make sure you get enough calcium and vitamin D while you are taking this medicine, unless your doctor tells you not to. Discuss the foods you eat and the vitamins you take with your health care professional. See your dentist regularly. Brush and floss your teeth as directed. Before you have any dental work done, tell your dentist you are   receiving this medicine. Do not become pregnant while taking this medicine or for 5 months after stopping it. Talk with your doctor or health care professional about your birth control options while taking this medicine. Women should inform their doctor if they wish to become pregnant or think they might be pregnant. There is a potential for serious side  effects to an unborn child. Talk to your health care professional or pharmacist for more information. What side effects may I notice from receiving this medicine? Side effects that you should report to your doctor or health care professional as soon as possible:  allergic reactions like skin rash, itching or hives, swelling of the face, lips, or tongue  bone pain  breathing problems  dizziness  jaw pain, especially after dental work  redness, blistering, peeling of the skin  signs and symptoms of infection like fever or chills; cough; sore throat; pain or trouble passing urine  signs of low calcium like fast heartbeat, muscle cramps or muscle pain; pain, tingling, numbness in the hands or feet; seizures  unusual bleeding or bruising  unusually weak or tired Side effects that usually do not require medical attention (report to your doctor or health care professional if they continue or are bothersome):  constipation  diarrhea  headache  joint pain  loss of appetite  muscle pain  runny nose  tiredness  upset stomach This list may not describe all possible side effects. Call your doctor for medical advice about side effects. You may report side effects to FDA at 1-800-FDA-1088. Where should I keep my medicine? This medicine is only given in a clinic, doctor's office, or other health care setting and will not be stored at home. NOTE: This sheet is a summary. It may not cover all possible information. If you have questions about this medicine, talk to your doctor, pharmacist, or health care provider.  2020 [employer] (2018-04-14 16:10:44)

## 2020-04-04 ENCOUNTER — Telehealth: Payer: Self-pay | Admitting: Hematology

## 2020-04-04 LAB — CANCER ANTIGEN 19-9: CA 19-9: 761 U/mL — ABNORMAL HIGH (ref 0–35)

## 2020-04-04 NOTE — Telephone Encounter (Signed)
Scheduled appt per 4/15 los.  Printed and mailed appt calendar

## 2020-04-18 MED FILL — CAPECITABINE 500 MG TABLET: 500 | 28 days supply | Qty: 56 | Fill #0

## 2020-04-22 ENCOUNTER — Encounter: Payer: Self-pay | Admitting: Cardiovascular Disease

## 2020-04-22 ENCOUNTER — Ambulatory Visit: Payer: Medicare Other | Admitting: Cardiovascular Disease

## 2020-04-22 ENCOUNTER — Other Ambulatory Visit: Payer: Self-pay

## 2020-04-22 VITALS — BP 128/80 | HR 65 | Temp 96.5°F | Ht 59.0 in | Wt 118.8 lb

## 2020-04-22 DIAGNOSIS — I519 Heart disease, unspecified: Secondary | ICD-10-CM | POA: Diagnosis not present

## 2020-04-22 DIAGNOSIS — I5189 Other ill-defined heart diseases: Secondary | ICD-10-CM

## 2020-04-22 DIAGNOSIS — I2510 Atherosclerotic heart disease of native coronary artery without angina pectoris: Secondary | ICD-10-CM | POA: Diagnosis not present

## 2020-04-22 DIAGNOSIS — R7989 Other specified abnormal findings of blood chemistry: Secondary | ICD-10-CM

## 2020-04-22 DIAGNOSIS — E785 Hyperlipidemia, unspecified: Secondary | ICD-10-CM

## 2020-04-22 DIAGNOSIS — I255 Ischemic cardiomyopathy: Secondary | ICD-10-CM | POA: Diagnosis not present

## 2020-04-22 DIAGNOSIS — I5020 Unspecified systolic (congestive) heart failure: Secondary | ICD-10-CM

## 2020-04-22 DIAGNOSIS — Z951 Presence of aortocoronary bypass graft: Secondary | ICD-10-CM

## 2020-04-22 DIAGNOSIS — C799 Secondary malignant neoplasm of unspecified site: Secondary | ICD-10-CM

## 2020-04-22 NOTE — Progress Notes (Signed)
Patient ID: Cassie Campbell, female   DOB: 1941-07-24, 79 y.o.   MRN: [id number]     HPI: Cassie Campbell is a 79 y.o. female who presents for a 2 month follow-up cardiology evaluation higher to initiating chemotherapy.  Ms. Aye has a history of complex CAD and underwent catheterization by Dr. Fletcher Anon with class IV symptoms on 10/16/2014.  She was found to have diffusely diseased LAD, which was not suitable for revascularization.  She had ruled in for non-ST segment elevation myocardial infarction, felt to be due to a severely calcified almost subtotal RCA with severe diffuse calcified disease.  Initial attempt at PCI by Dr. Fletcher Anon was unsuccessful due to the severe calcification.  She was brought back to the laboratory the following day, and I performed a very long attempt at high-speed rotational atherectomy.  Unfortunately, due to the severely stenosed calcified lesion above the acute margin the Roto floppy wire was never be advanced distal enough due to severe disease beyond this site to allow the burr to be inserted to reach the stenosis.  Multiple attempts were made to utilize wire transfer, but even the smallest catheter was never able to pass the stenosis to allow for the Roto floppy wire to be reinserted in exchange for a Fielder XT wire, which was able to cross the stenosis and advanced distally.  Consequently, the procedure was aborted.  She was sent home on increased medication regimen with potential plans for possible follow-up evaluation depending upon symptom status.  Since hospital discharge, she has not experienced any significant recurrent anginal chest pain.  During the hospitalization ranolazine was added initially at 500 mg twice a day to isosorbide mononitrate, amlodipine, and beta blocker therapy.  She has been treated with aspirin and Brilinta for dual antiplatelet therapy. When I saw her for subsequent evaluation I further titrated her ranolazine to 1000 mg twice a day and further  titrated her Lopressor to 37.5 mg twice a day.  Her creatinine remained normal following her contrast load and she was mildly anemic.    She has a history of asthma and is on Symbicort 160-4.5 and Singulair.  She  has a history of hyperlipidemia, currently on Lipitor 80 mg.  She does have GERD for which he takes Pepcid as well as omeprazole.  She developed some increased wheezing on Lopressor and was changed to bisoprolol 5 mg in place of metoprolol.  She has seen Dr. Melvyn Novas for her severe chronic asthma and in the past.  She also was found to have marked atopy with significant elevation of IgG E levels.  She feels that her breathing has improved with the changed to bisoprolol from Lopressor.   In March 2016 she had been remaining stable but  had experienced a 10-15 minute episode of chest pain during the evening which responded to nitroglycerin. When I saw her the following day she had been taking isosorbide 90 mg in the morning and I added isosorbide 30 mg at bedtime to her medical regimen of Ranexa 1000 mg twice a day , amlodipine 10 mg daily, and bisoprolol 5 mg. She denies recurrent chest pain symptomatology since the nocturnal dose of Imdur was added.  She developed a rash and  was advised to stop both Brilinta and Ranexa.  She was started on Plavix. Her rash ultimately resolved.    Her husband passed away in on 2016-01-03.  She has been more active.  She denies nocturnal symptoms.  She was evaluated in the emergency room in  January 2017 with right shoulder discomfort.    She underwent low back surgery in August 2018 by Dr. Louanne Skye. She tolerated surgery well.  A preoperative nuclear study showed an EF of 70% with probable apical soft tissue attenuation.  There was no ischemia.  Last saw her in November 2018 and she was doing well without chest pain or shortness of breath.  Although she has a history of mild asthma.   She was diagnosed with GERD.  She underwent successful cataract surgery right eye in  February and left eye in April 2019 by Dr. Satira Sark.  Since I saw her in June 2019 she was seen by Kerin Ransom on September 26, 2018 with a history suggestive of recurrent unstable angina.  She was hospitalized and on September 27, 2018 repeat cardiac catheterization by me was performed.  This revealed severe coronary calcification involving the LAD circumflex and RCA.  There was smooth 20 to 25% distal left main narrowing.  The LAD had 40% proximal calcified stenosis.  The LAD was severely calcified with 95% stenosis in the region of the first diagonal and there was diffuse disease in the midsegment of 50% followed by 90% mid stenosis and 80% distal stenosis.  The circumflex was calcified without high-grade obstructive disease.  The RCA was calcified and was totally occluded just prior to the acute margin.  There was extensive left-to-right collateralization to the distal RCA via the left circulation.  There was low normal LV function with an EF of 50 to 55% with a small region of focal mid mild anterolateral and posterior basal inferior hypocontractility.  Due to the severe calcification CABG revascularization surgery was recommended.  She underwent CABG surgery x2 with her LIMA to her LAD and SVG to the PDA.  However, during surgery she was instantly found to have a mediastinal lymph node which was sent for pathology and unfortunately showed metastatic adenocarcinoma.  She is now being followed by Dr. 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  PET and CT imaging of her chest abdomen and pelvis with contrast showed diffuse adenopathy from supraclavicular to the abdomen with a hypermetabolic mass in the gallbladder and large hypermetabolic bony lesion in the posterior left pelvis.    I saw her on December 06, 2018 in follow-up of her CABG revascularization and oncologic assessments.  She underwent successful Port-A-Cath placement on December 20 and Plavix was held for this  procedure.  I recommended that she undergo an echo Doppler study prior to initiating chemotherapy.  Her echo Doppler study was done on December 11, 2018 and showed an EF of 40 to 45% status post revascularization with residual akinesis of the mid apical anteroseptal, inferoseptal and apical wall.  She had grade 1 diastolic dysfunction.  Tissue Doppler was consistent with high ventricular filling pressures.  She had mildly increased PA pressure 38 mm and abnormal global longitudinal strain pattern.  On December 15, 2018 she initiated her first course of chemotherapy.  She had some mild nausea but otherwise felt well.  She is feeling better.  She is scheduled to have a follow-up oncology evaluation later this week.  From a cardiac perspective, she denies any chest pain or shortness of breath.  She is now able to walk from room to room without her previous dyspnea or chest discomfort.  She feels improved.    I saw her in a telemedicine evaluation in April 2020.  Since her December evaluation she had been undergoing successful  chemotherapy every 2 weeks with cis-platinum and gemcitabine.  She has completed 6 cycles and is currently taking a month off with plans for reinstitution on May 1.  Her most recent PET scan was significantly encouraging and showed significant early benefit with reduction in tumor burden.   I  saw her in November 2020 at which time she denied any recurrent chest pain.  She was admitting to fatigue following her chemotherapy treatments.    She has recently undergone reevaluation by Dr. Burr Medico her oncologist and her most recent PET scan from December 2020 showed significant progression of malignancy with increased activity and her gallbladder mass, increase in size and activity of the porta hepatis, retroperitoneal, and upper pelvic adenopathy.  There was also significant increase in size and metabolic activity of the left ischial metastatic lesion with extensive extraosseous component and new  mildly hypermetabolic left subclavicular and thoracic periaortic lymph nodes.  As result, she is scheduled to initiate new therapy next week with Herceptin.  Dr. Burr Medico has requested I see the patient prior to initiation of new therapy and obtain an echo Doppler study.    Per Dr. Ernestina Penna request, I saw Dr. Mel Almond prior to initiating chemotherapy on January 04, 2020.  At that time, her blood pressure was stable on bisoprolol 10 mg in addition to furosemide 20 mg daily.  She continues to be on Plavix 75 mg daily.  She was not having any anginal symptoms.  She appears euvolemic on exam.  Her asthma was well controlled.  She denied any chest pain or shortness of breath at rest.  At times there was some mild shortness of breath with activity.  Her echo Doppler study on January 07, 2020 now showed significant decline in LV function with EF 25 to 30%, restrictive physiology with grade 3 diastolic dysfunction.  She had initiated her chemotherapy with Xeloda 1 week on and 1 week off but following the first dose she became sick with nausea and vomiting and it was felt that she had taken too high a dose.  Her dose was subsequently reduced from 1500 mg twice a day to 500 mg bid which she has tolerated.    She was evaluated by me on January 18, 2020 in a follow-up telemedicine evaluation subsequent to her echo Doppler study revealing  reduced LV function.  During that evaluation she was feeling well and did not have any anginal symptoms.  I discussed that it was possible that her reduced LV function may be contributed by her initial round of chemotherapy.  She had continued to be on bisoprolol 10 mg daily in addition to furosemide 20 mg.  With her decline in LV function I suggested initiation of low-dose ARB therapy with losartan 25 mg with plans hopefully to transition to [facility] depending upon blood pressure response.  She had become ill following her first dose of Xeloda when her symptoms resolved with dose adjustment to a  significantly lower dose.  She was last evaluated by me on February 15, 2020.  Over the prior month she had felt well.  She tolerated losartan and denied any dizziness or chest pain.  Laboratory in February 9 showed a potassium of 4.3, creatinine 0.86.  She was mildly anemic with a hemoglobin of 11.4 hematocrit 34.9.  During that evaluation recommended discontinuance of furosemide as well as losartan and provided her samples with Entresto 24/26 mg twice a day.  She has felt well with therapy.  She was scheduled to see Joslyn Hy in  several weeks to reassess Entresto and attempt further titration of blood pressure allow.  However blood pressure was too low to warrant increasing dose.  Since I last saw her, she feels better with Entresto.  She denies any increasing shortness of breath.  The past year she has lost approximately 20 pounds.  She denies chest pain PND orthopnea.  She is tolerating her chemotherapeutic regimen.  She denies presyncope or syncope.  She presents for follow-up cardiology evaluation.  Past Medical History:  Diagnosis Date  . Allergic rhinitis   . Arthritis   . Asthma    xolair s 8/05 ?11/07; mastered hfa 12/20/08  . Benign positional vertigo   . CAD (coronary artery disease)    a. 09/2014 NSTEMI s/p LHC with sig 2V dz. dLAD diffusely diseased and not suitable for PCI. unsuccessful RCA PCI d/t heavy calcifications  . gallbladder ca dx'd 10/2018  . GERD (gastroesophageal reflux disease)   . Heart attack ([place])    09/2014  . Hyperlipidemia    <130 ldl pos fm hx, bp  . Hypertension   . Osteopenia    dexa 08/22/07 AP spine + 1.1, left femur -1.3, right femur -.8; dexa 10/06/09 +1.6, left femur =1.6, right femur -.  . PONV (postoperative nausea and vomiting)   . Ruptured disc, cervical   . 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 BREAST SURGERY  10-26-10   Rt. breast bx--for  microcalcifications in rt. retroareolar region--dx was hyalinized fibroadenoma  . BUNIONECTOMY Bilateral   . CARDIAC CATHETERIZATION     2015  . CATARACT EXTRACTION Right 02/02/2018   Dr Satira Sark  . CATARACT EXTRACTION Left 03/30/2018  . [place] SURGERY  12/01  . CORONARY ARTERY BYPASS GRAFT N/A 10/04/2018   Procedure: CORONARY ARTERY BYPASS GRAFTING (CABG) times 2 using left  Internal mammary artery to LAD, left greater saphenous vein - open harvest.;  Surgeon: [name], MD;  Location: [facility];  Service: Open Heart Surgery;  Laterality: N/A;  . IR IMAGING GUIDED PORT INSERTION  12/08/2018  . LEFT HEART CATH AND CORONARY ANGIOGRAPHY N/A 09/27/2018   Procedure: LEFT HEART CATH AND CORONARY ANGIOGRAPHY;  Surgeon: Troy Sine, MD;  Location: [facility];  Service: Cardiovascular;  Laterality: N/A;  . LEFT HEART CATHETERIZATION WITH CORONARY ANGIOGRAM N/A 10/16/2014   Procedure: LEFT HEART CATHETERIZATION WITH CORONARY ANGIOGRAM;  Surgeon: Blane Ohara, MD;  Location: [facility] CATH LAB;  Service: Cardiovascular;  Laterality: N/A;  . Left L4-5 transforaminal lumbar interbody fusion with Depuy cage, rods and screws, local and allograft bone graft, Vivigen; bilateral decompression/partial hemilaminectomy lumbar five-sacral one  07/2017  . PERCUTANEOUS CORONARY ROTOBLATOR INTERVENTION (PCI-R) N/A 10/17/2014   Procedure: PERCUTANEOUS CORONARY ROTOBLATOR INTERVENTION (PCI-R);  Surgeon: Troy Sine, MD;  Location: [facility] CATH LAB;  Service: Cardiovascular;  Laterality: N/A;  . TEE WITHOUT CARDIOVERSION N/A 10/04/2018   Procedure: TRANSESOPHAGEAL ECHOCARDIOGRAM (TEE);  Surgeon: [name], MD;  Location: [facility];  Service: Open Heart Surgery;  Laterality: N/A;  . VEIN LIGATION AND STRIPPING Left   . VIDEO BRONCHOSCOPY Bilateral 02/05/2015   Procedure: VIDEO BRONCHOSCOPY WITHOUT FLUORO;  Surgeon: Tanda Rockers, MD;  Location: [facility];  Service: Endoscopy;  Laterality:  Bilateral;  . VULVA /PERINEUM BIOPSY  12-30-10   --epidermoid cyst    Allergies  Allergen Reactions  . Fish-Derived Products Shortness Of Breath, Swelling and Rash  . Penicillins  Shortness Of Breath, Swelling and Rash    PATIENT HAS HAD A PCN REACTION WITH IMMEDIATE RASH, FACIAL/TONGUE/THROAT SWELLING, SOB, OR LIGHTHEADEDNESS WITH HYPOTENSION:  #  #  #  YES  #  #  #   Has patient had a PCN reaction causing severe rash involving mucus membranes or skin necrosis: No PATIENT HAS HAD A PCN REACTION THAT REQUIRED HOSPITALIZATION:  #  #  #  YES  #  #  #   Has patient had a PCN reaction occurring within the last 10 years: No    . Aspirin Swelling and Rash    SWELLING REACTION UNSPECIFIED   . Brilinta [Ticagrelor] Rash    Started Brilinta 09/2014 - rash - unsure which it is directly related to  . Codeine Phosphate Nausea Only  . Diphenhydramine Hcl Rash    Current Outpatient Medications  Medication Sig Dispense Refill  . albuterol (PROAIR HFA) 108 (90 Base) MCG/ACT inhaler INHALE 1 TO 2 PUFFS BY MOUTH EVERY 4 HOURS AS NEEDED FOR WHEEZING 8.5 g 3  . atorvastatin (LIPITOR) 40 MG tablet Take 1 tablet (40 mg total) by mouth daily. 90 tablet 3  . bisoprolol (ZEBETA) 10 MG tablet TAKE 1 TABLET(10 MG) BY MOUTH DAILY 90 tablet 2  . budesonide-formoterol (SYMBICORT) 160-4.5 MCG/ACT inhaler INHALE 2 PUFFS BY MOUTH EVERY 12 HOURS 30.6 g 3  . capecitabine (XELODA) 500 MG tablet TAKE 2 TABLETS BY MOUTH EVERY 12 HOURS, 7 DAYS ON AND 7 DAYS OFF 56 tablet 0  . cetirizine (ZYRTEC) 10 MG tablet Take 10 mg by mouth at bedtime as needed for allergies.    . cholecalciferol 2000 units TABS Take 1 tablet (2,000 Units total) by mouth daily. 30 tablet 3  . clopidogrel (PLAVIX) 75 MG tablet TAKE 1 TABLET(75 MG) BY MOUTH DAILY 90 tablet 3  . dextromethorphan-guaiFENesin (MUCINEX DM) 30-600 MG per 12 hr tablet Take 1-2 tablets by mouth every 12 (twelve) hours as needed for cough (with flutter).     . famotidine  (PEPCID) 20 MG tablet Take 20 mg by mouth as needed.     . ferrous sulfate 325 (65 FE) MG tablet Take 650 mg by mouth daily with breakfast.     . fluticasone (FLONASE) 50 MCG/ACT nasal spray Place 1-2 sprays into both nostrils 2 (two) times daily as needed for allergies or rhinitis.    Marland Kitchen lidocaine-prilocaine (EMLA) cream APPLY EXTERNALLY TO THE AFFECTED AREA ONCE DAILY AS DIRECTED 30 g 3  . magic mouthwash SOLN Take 5 mLs by mouth 4 (four) times daily. Leave Benadryl out of compound since pt allergic to it. Use hydrocortisone & nystatin. Swish & swallow or spit for thrush 240 mL 1  . magnesium oxide (MAGNESIUM-OXIDE) 400 (241.3 Mg) MG tablet Take 1 tablet (400 mg total) by mouth 3 (three) times daily. 90 tablet 2  . meclizine (ANTIVERT) 25 MG tablet TAKE 2 TABLETS BY MOUTH THREE TIMES DAILY AS NEEDED 24 tablet 2  . montelukast (SINGULAIR) 10 MG tablet TAKE 1 TABLET(10 MG) BY MOUTH AT BEDTIME 90 tablet 1  . Multiple Vitamin (MULTIVITAMIN) capsule Take 1 capsule by mouth daily.     . ondansetron (ZOFRAN) 8 MG tablet Take 1 tablet (8 mg total) by mouth every 8 (eight) hours as needed for nausea or vomiting. 30 tablet 1  . oxyCODONE (OXY IR/ROXICODONE) 5 MG immediate release tablet Take 1 tablet (5 mg total) by mouth every 8 (eight) hours as needed for severe pain. 45 tablet 0  .  sacubitril-valsartan (ENTRESTO) 24-26 MG Take 1 tablet by mouth 2 (two) times daily. 180 tablet 3   No current facility-administered medications for this visit.    Social History   Socioeconomic History  . Marital status: Widowed    Spouse name: Not on file  . Number of children: Not on file  . Years of education: Not on file  . Highest education level: Not on file  Occupational History  . Occupation: retired Tour manager  Tobacco Use  . Smoking status: Never Smoker  . Smokeless tobacco: Never Used  Substance and Sexual Activity  . Alcohol use: No    Alcohol/week: 0.0 standard drinks  . Drug use: No  .  Sexual activity: Not Currently    Partners: Male    Birth control/protection: Post-menopausal  Other Topics Concern  . Not on file  Social History Narrative  . Not on file   Social Determinants of Health   Financial Resource Strain:   . Difficulty of Paying Living Expenses:   Food Insecurity:   . Worried About Charity fundraiser in the Last Year:   . Arboriculturist in the Last Year:   Transportation Needs:   . Film/video editor (Medical):   Marland Kitchen Lack of Transportation (Non-Medical):   Physical Activity:   . Days of Exercise per Week:   . Minutes of Exercise per Session:   Stress:   . Feeling of Stress :   Social Connections:   . Frequency of Communication with Friends and Family:   . Frequency of Social Gatherings with Friends and Family:   . Attends Religious Services:   . Active Member of Clubs or Organizations:   . Attends Archivist Meetings:   Marland Kitchen Marital Status:   Intimate Partner Violence:   . Fear of Current or Ex-Partner:   . Emotionally Abused:   Marland Kitchen Physically Abused:   . Sexually Abused:     Family History  Problem Relation Age of Onset  . Diabetes Mother        AODM  . Stroke Mother   . Hypertension Mother   . Heart disease Father   . Diabetes Sister        AODM  . Hypertension Sister   . 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 Other comprehensive 14 point system review is negative   Physical Exam BP 128/80   Pulse 65   Temp (!) 96.5 F (35.8 C)   Ht '4\' 11"'$  (1.499 m)   Wt 118 lb 12.8 oz (53.9 kg)   LMP 12/21/1999   SpO2 98%   BMI 23.99 kg/m    Repeat blood pressure by me was 122/80.  She states that at  times her blood pressure dropped to 110 and she notes transient lightheadedness.  Wt Readings from Last 3 Encounters:  04/22/20 118 lb 12.8 oz (53.9 kg)  04/03/20 118 lb 6.4 oz (53.7 kg)  03/06/20 116 lb 9.6 oz (52.9 kg)   General: Alert, oriented, no distress.  Skin: normal turgor, no rashes, warm and dry HEENT: Normocephalic, atraumatic. Pupils equal round and reactive to light; sclera anicteric; extraocular muscles intact;  Nose without nasal septal hypertrophy Mouth/Parynx benign; Mallinpatti scale 2 Neck: No JVD, no carotid bruits; normal carotid upstroke Lungs: clear to ausculatation and percussion; no wheezing or rales Chest wall: without tenderness to palpitation Heart: PMI not displaced, RRR, s1 s2 normal, 1/6 systolic murmur, no diastolic murmur, no rubs, gallops, thrills, or heaves Abdomen: soft, nontender; no hepatosplenomehaly, BS+; abdominal aorta nontender and not dilated by palpation. Back: no CVA tenderness Pulses 2+ Musculoskeletal: full range of motion, normal strength, no joint deformities Extremities: no clubbing cyanosis or edema, Homan's sign negative  Neurologic: grossly nonfocal; Cranial nerves grossly wnl Psychologic: Normal mood and affect    ECG (independently read by me): Normal sinus rhythm at 65 bpm.  T wave abnormality  anterolaterally.  Normal intervals.  No ectopy.  January 04, 2020 ECG (independently read by me): Normal sinus rhythm at 65 bpm.  QS complex V1 V2 with T wave abnormality V1 through V5.  Normal intervals.  No ectopy  November 2020 ECG (independently read by me): Sinus rhythm at 89 bpm.  Nonspecific T wave abnormality.  Normal intervals.  No ectopy  January 2020 ECG (independently read by me): Normal sinus rhythm at 78 bpm.  Biatrial enlargement.  QS complex V1 through V3.  Anterolateral T wave abnormality.  December 2019 ECG (independently read by me): Sinus rhythm at 76 bpm.  QS complex anteroseptally.  Lateral T wave abnormality.    June 2019 ECG (independently read by me): Normal sinus rhythm 81 bpm.  Right bundle branch block with repolarization changes.  November 16, 2017 ECG (independently read by me): Normal sinus rhythm at 75 bpm with right bundle branch block.  Left axis deviation.  Q waves in 3 and aVF.  April 2018 ECG (independently read by me): Normal sinus rhythm at 74 bpm.  Right bundle-branch block with repolarization changes.  October 2017 ECG (independently read by me): Normal sinus rhythm at 68 bpm.  The right bundle branch block with repolarization changes.  April 2017 ECG (independently read by me): Normal sinus rhythm at 70 bpm.  Right bundle-branch block with repolarization changes.  November 2016 ECG (independently read by me): Normal sinus rhythm at 70 bpm.  Right bundle-branch block with repolarization changes.  May 2016 ECG (independently read by me):  Normal sinus rhythm at 65 bpm. Right bundle branch block with repolarization changes.  March 2016 ECG (independently read by me): Normal sinus rhythm at 68 bpm.  No ectopy.  No signal been ST segment changes.  December 2015 ECG (independently read by me): Normal sinus rhythm at 77 bpm.  Normal intervals.  No significant ST changes.  November 2015 ECG (independently read by me):sinus rhythm at 88 bpm.  QTc interval  454 ms.  No significant ST-T changes.  LABS:  BMP Latest Ref Rng & Units 04/03/2020 03/06/2020 02/26/2020  Glucose 70 - 99 mg/dL 103(H) 101(H) 97  BUN 8 - 23 mg/dL '17 14 11  '$ Creatinine 0.44 - 1.00 mg/dL 1.17(H) 1.07(H) 1.02(H)  BUN/Creat Ratio 12 - 28 - - 11(L)  Sodium 135 - 145 mmol/L  139 141 139  Potassium 3.5 - 5.1 mmol/L 4.1 4.2 4.5  Chloride 98 - 111 mmol/L 105 103 100  CO2 22 - 32 mmol/L '25 25 24  '$ Calcium 8.9 - 10.3 mg/dL 9.4 9.7 9.6    Hepatic Function Latest Ref Rng & Units 04/03/2020 03/06/2020 02/25/2020  Total Protein 6.5 - 8.1 g/dL 8.3(H) 8.4(H) 8.4(H)  Albumin 3.5 - 5.0 g/dL 3.4(L) 3.5 3.5  AST 15 - 41 U/L '23 27 25  '$ ALT 0 - 44 U/L '12 12 12  '$ Alk Phosphatase 38 - 126 U/L 57 55 56  Total Bilirubin 0.3 - 1.2 mg/dL 0.4 0.4 0.4  Bilirubin, Direct 0.0 - 0.3 mg/dL - - -    CBC Latest Ref Rng & Units 04/03/2020 03/06/2020 02/25/2020  WBC 4.0 - 10.5 K/uL 4.4 4.2 4.8  Hemoglobin 12.0 - 15.0 g/dL 11.2(L) 11.9(L) 12.5  Hematocrit 36.0 - 46.0 % 34.1(L) 36.3 37.7  Platelets 150 - 400 K/uL 324 378 316   Lab Results  Component Value Date   MCV 101.2 (H) 04/03/2020   MCV 98.1 03/06/2020   MCV 97.4 02/25/2020   Lab Results  Component Value Date   TSH 1.360 11/06/2019   Lab Results  Component Value Date   HGBA1C 5.9 (H) 10/04/2018    Lipid Panel     Component Value Date/Time   CHOL 201 (H) 11/06/2019 1010   TRIG 147 11/06/2019 1010   TRIG 85 10/05/2006 1022   HDL 52 11/06/2019 1010   CHOLHDL 3.9 11/06/2019 1010   CHOLHDL 3 03/09/2018 0909   VLDL 21.8 03/09/2018 0909   LDLCALC 123 (H) 11/06/2019 1010   RADIOLOGY: No results found.  IMPRESSION:  1. HFrEF (heart failure with reduced ejection fraction) ([place])   2. CAD in native artery   3. S/P CABG (coronary artery bypass graft)   4. Grade III diastolic dysfunction   5. Ischemic cardiomyopathy   6. Elevated brain natriuretic peptide (BNP) level   7. Hyperlipidemia with target LDL less than 70   8. Metastatic  adenocarcinoma [facility])     ASSESSMENT AND PLAN: Mrs. Petronella Shuford is a 79 year-old African-American female who suffered a NSTEMI in October 2015 which was felt to be due to subtotal high-grade calcified RCA with severe diffuse distal disease beyond her subtotal stenosis.  She also has a severely diffusely diseased mid distal LAD which was not amenable for revascularization.  She was treated medically and did well until October 2019 when she developed recurrent angina consistent with unstable symptoms necessitating repeat catheterization.  She had severely calcified CAD  and unsuccessful CABG revascularization surgery.  She did well from a cardiac surgical standpoint but unfortunately was found to have mediastinal adenopathy and has been diagnosed with metastatic adenocarcinoma of gallbladder cancer etiology.  Her echo Doppler study prior to initiating chemotherapy showed an EF of 40 to 45% with akinesis of the mid apical anteroseptal, inferoseptal, and apical myocardium with anterior hypokinesis.  She had grade 1 diastolic dysfunction and abnormal tissue Doppler consistent with high ventricular filling pressures.  Systolic peak pressure was elevated at 38 mm.  Global strain was abnormal.  She initiated chemotherapy on December 15, 2018  and tolerated her initial treatment fairly well. She is followed by Dr. Burr Medico and subsequent imaging studies had initially shown a reduction of her tumor burden.  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  When I saw her prior to instituting therapy she appeared euvolemic.  Her most recent echo showed an EF reduced at 25 to 30% with restrictive physiology, grade 3 diastolic dysfunction.  She has been transitioned off ARB therapy and is now on Entresto 24/26 mg twice a day.  Due to blood pressure limitations he has not been able to further  titrate her dose to 49/51 mg twice a day.  However clinically she does note improvement since the transition to [facility].  A proBNP level 2 weeks after Entresto initiation was elevated at 951.  Her blood pressure today is stable but she states at times she has noticed some lightheadedness and she has taken her blood pressure at home and it may often be around 882 systolic.  For this reason I will not further titrate Entresto today.  Depending on blood pressure, she may benefit from initiation of low-dose spironolactone in the future.  She continues to be on bisoprolol 10 mg and is tolerating this well.  She is on atorvastatin 40 mg daily for hyperlipidemia with target LDL less than 70.  She continues to be on Plavix for antiplatelet benefit.  I will see her in 4 months for reevaluation or sooner as needed.    Troy Sine, MD, [facility]  04/24/2020 3:15 PM

## 2020-04-22 NOTE — Patient Instructions (Signed)
Medication Instructions:  CONTINUE WITH CURRENT MEDICATIONS. NO CHANGES.  *If you need a refill on your cardiac medications before your next appointment, please call your pharmacy*  Follow-Up: At [facility], you and your health needs are our priority.  As part of our continuing mission to provide you with exceptional heart care, we have created designated Provider Care Teams.  These Care Teams include your primary Cardiologist (physician) and Advanced Practice Providers (APPs -  Physician Assistants and Nurse Practitioners) who all work together to provide you with the care you need, when you need it.  We recommend signing up for the patient portal called "MyChart".  Sign up information is provided on this After Visit Summary.  MyChart is used to connect with patients for Virtual Visits (Telemedicine).  Patients are able to view lab/test results, encounter notes, upcoming appointments, etc.  Non-urgent messages can be sent to your provider as well.   To learn more about what you can do with MyChart, go to [email or web address].    Your next appointment:   4 month(s)  The format for your next appointment:   In Person  Provider:   Thomas Kelly, MD    

## 2020-04-24 ENCOUNTER — Encounter: Payer: Self-pay | Admitting: Cardiovascular Disease

## 2020-04-29 ENCOUNTER — Inpatient Hospital Stay: Payer: Medicare Other

## 2020-04-29 ENCOUNTER — Inpatient Hospital Stay: Payer: Medicare Other | Attending: Obstetrics

## 2020-04-29 ENCOUNTER — Other Ambulatory Visit: Payer: Self-pay

## 2020-04-29 DIAGNOSIS — I2510 Atherosclerotic heart disease of native coronary artery without angina pectoris: Secondary | ICD-10-CM | POA: Insufficient documentation

## 2020-04-29 DIAGNOSIS — C7951 Secondary malignant neoplasm of bone: Secondary | ICD-10-CM | POA: Diagnosis not present

## 2020-04-29 DIAGNOSIS — C23 Malignant neoplasm of gallbladder: Secondary | ICD-10-CM | POA: Insufficient documentation

## 2020-04-29 DIAGNOSIS — C778 Secondary and unspecified malignant neoplasm of lymph nodes of multiple regions: Secondary | ICD-10-CM | POA: Insufficient documentation

## 2020-04-29 DIAGNOSIS — M79605 Pain in left leg: Secondary | ICD-10-CM | POA: Insufficient documentation

## 2020-04-29 DIAGNOSIS — Z452 Encounter for adjustment and management of vascular access device: Secondary | ICD-10-CM | POA: Diagnosis not present

## 2020-04-29 DIAGNOSIS — D6481 Anemia due to antineoplastic chemotherapy: Secondary | ICD-10-CM | POA: Insufficient documentation

## 2020-04-29 DIAGNOSIS — M8580 Other specified disorders of bone density and structure, unspecified site: Secondary | ICD-10-CM | POA: Diagnosis not present

## 2020-04-29 DIAGNOSIS — Z5111 Encounter for antineoplastic chemotherapy: Secondary | ICD-10-CM | POA: Insufficient documentation

## 2020-04-29 DIAGNOSIS — I110 Hypertensive heart disease with heart failure: Secondary | ICD-10-CM | POA: Insufficient documentation

## 2020-04-29 DIAGNOSIS — J45909 Unspecified asthma, uncomplicated: Secondary | ICD-10-CM | POA: Diagnosis not present

## 2020-04-29 LAB — CMP (CANCER CENTER ONLY)
ALT: 12 U/L (ref 0–44)
AST: 25 U/L (ref 15–41)
Albumin: 3.4 g/dL — ABNORMAL LOW (ref 3.5–5.0)
Alkaline Phosphatase: 55 U/L (ref 38–126)
Anion gap: 10 (ref 5–15)
BUN: 18 mg/dL (ref 8–23)
CO2: 24 mmol/L (ref 22–32)
Calcium: 9.7 mg/dL (ref 8.9–10.3)
Chloride: 105 mmol/L (ref 98–111)
Creatinine: 1.17 mg/dL — ABNORMAL HIGH (ref 0.44–1.00)
GFR, Est AFR Am: 52 mL/min — ABNORMAL LOW (ref >60)
GFR, Estimated: 45 mL/min — ABNORMAL LOW (ref >60)
Glucose, Bld: 104 mg/dL — ABNORMAL HIGH (ref 70–99)
Potassium: 4.4 mmol/L (ref 3.5–5.1)
Sodium: 139 mmol/L (ref 135–145)
Total Bilirubin: 0.5 mg/dL (ref 0.3–1.2)
Total Protein: 8.3 g/dL — ABNORMAL HIGH (ref 6.5–8.1)

## 2020-04-29 LAB — MAGNESIUM: Magnesium: 1.9 mg/dL (ref 1.7–2.4)

## 2020-04-29 LAB — CBC WITH DIFFERENTIAL (CANCER CENTER ONLY)
Abs Immature Granulocytes: 0.01 10*3/uL (ref 0.00–0.07)
Basophils Absolute: 0 10*3/uL (ref 0.0–0.1)
Basophils Relative: 1 %
Eosinophils Absolute: 0.1 10*3/uL (ref 0.0–0.5)
Eosinophils Relative: 1 %
HCT: 34.1 % — ABNORMAL LOW (ref 36.0–46.0)
Hemoglobin: 11.4 g/dL — ABNORMAL LOW (ref 12.0–15.0)
Immature Granulocytes: 0 %
Lymphocytes Relative: 13 %
Lymphs Abs: 0.7 10*3/uL (ref 0.7–4.0)
MCH: 34.2 pg — ABNORMAL HIGH (ref 26.0–34.0)
MCHC: 33.4 g/dL (ref 30.0–36.0)
MCV: 102.4 fL — ABNORMAL HIGH (ref 80.0–100.0)
Monocytes Absolute: 0.4 10*3/uL (ref 0.1–1.0)
Monocytes Relative: 7 %
Neutro Abs: 3.8 10*3/uL (ref 1.7–7.7)
Neutrophils Relative %: 78 %
Platelet Count: 321 10*3/uL (ref 150–400)
RBC: 3.33 MIL/uL — ABNORMAL LOW (ref 3.87–5.11)
RDW: 19.2 % — ABNORMAL HIGH (ref 11.5–15.5)
WBC Count: 4.9 10*3/uL (ref 4.0–10.5)
nRBC: 0 % (ref 0.0–0.2)

## 2020-04-29 MED ORDER — SODIUM CHLORIDE 0.9% FLUSH
10.0000 mL | Freq: Once | INTRAVENOUS | Status: AC | PRN
  Administered 2020-04-29: 10 mL
  Filled 2020-04-29: qty 10

## 2020-04-29 MED ORDER — HEPARIN SOD (PORK) LOCK FLUSH 100 UNIT/ML IV SOLN
500.0000 [IU] | Freq: Once | INTRAVENOUS | Status: AC | PRN
  Administered 2020-04-29: 500 [IU]
  Filled 2020-04-29: qty 5

## 2020-04-30 ENCOUNTER — Ambulatory Visit (HOSPITAL_COMMUNITY)
Admission: RE | Admit: 2020-04-30 | Discharge: 2020-04-30 | Disposition: A | Discharge status: Home / Self Care | Payer: Medicare Other | Source: Ambulatory Visit | Attending: Hematology | Admitting: Hematology

## 2020-04-30 DIAGNOSIS — C778 Secondary and unspecified malignant neoplasm of lymph nodes of multiple regions: Secondary | ICD-10-CM | POA: Diagnosis not present

## 2020-04-30 DIAGNOSIS — C7951 Secondary malignant neoplasm of bone: Secondary | ICD-10-CM | POA: Diagnosis not present

## 2020-04-30 DIAGNOSIS — C23 Malignant neoplasm of gallbladder: Secondary | ICD-10-CM | POA: Diagnosis not present

## 2020-04-30 DIAGNOSIS — Z79899 Other long term (current) drug therapy: Secondary | ICD-10-CM | POA: Diagnosis not present

## 2020-04-30 DIAGNOSIS — I700 Atherosclerosis of aorta: Secondary | ICD-10-CM | POA: Diagnosis not present

## 2020-04-30 LAB — GLUCOSE, CAPILLARY: Glucose-Capillary: 93 mg/dL (ref 70–99)

## 2020-04-30 LAB — CANCER ANTIGEN 19-9: CA 19-9: 1404 U/mL — ABNORMAL HIGH (ref 0–35)

## 2020-04-30 MED ORDER — FLUDEOXYGLUCOSE F - 18 (FDG) INJECTION
5.7900 | Freq: Once | INTRAVENOUS | Status: AC | PRN
  Administered 2020-04-30: 09:00:00 5.79 via INTRAVENOUS

## 2020-04-30 NOTE — Progress Notes (Signed)
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 Lymph node, biopsy, mediastinal - LYMPH NODE WITH METASTATIC ADENOCARCINOMA. - SEE MICROSCOPIC DESCRIPTION 2. Plaque, coronary artery - CALCIFIED ATHEROSCLEROTIC PLAQUE.   10/14/2018 Miscellaneous   Foundation One:  MSI stable tumor mutation burden 3Muts/mb ERBB2 amplification CCNE1 amplification TP53 mutation(+) CDK6 amplification  HGF amplification    11/13/2018 Cancer Staging   Staging form: Gallbladder, AJCC 8th Edition - Clinical stage from 11/13/2018: Stage IVB (cTX, cN2, pM1) - Signed by Truitt Merle, MD on 12/15/2018   11/17/2018 Imaging   11/17/2018 CT CAP IMPRESSION: 1. 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 This is associated with extensive upper abdominal and retroperitoneal lymphadenopathy, as well as metastatic lymphadenopathy in the posterior mediastinum and left supraclavicular region. There is also a metastatic lesion to the left ischium. 2. 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 3. Aortic atherosclerosis, in addition to left main and 3 vessel coronary artery disease. Status post median sternotomy for CABG including LIMA to the LAD. 4. There are calcifications of the aortic valve. Echocardiographic correlation for evaluation  of potential valvular dysfunction may be warranted if clinically indicated.    11/21/2018 Initial Diagnosis   Gallbladder cancer ([place])   11/27/2018 Pathology Results   11/27/2018 CA19-9 immunohistochemical stain Per request, a CA19-9 immunohistochemical stain was performed at an outside institution revealing positive staining in the tumor cells.    12/15/2018 - 11/30/2019 Chemotherapy   First line chemo cisplatin and gemcitabine every 2 weeks on 12/15/18. Had 1 month chemo break in April, restarted on 04/20/19. Stopped 11/30/19 due to disease progression.    01/26/2019 -  Chemotherapy   Zometa q73month starting 01/26/19-11/02/19. Switched to monthly Xgeva injections on 04/03/20   02/20/2019 PET scan   Restaging PET scan:  IMPRESSION: 1. Positive response to therapy at all primary and metastatic sites. Persistent primary and metastatic lesions do have intense hypermetabolic activity albeit reduced. 2. Decrease in size and hypermetabolic activity of mass lesion in the gallbladder fundus. 3. Interval decrease in size and metabolic activity of periportal adenopathy. 4. Decrease in size and metabolic activity of periaortic lymph nodes. 5. Interval decrease in size and metabolic activity of destructive lesion in the LEFT inferior pubic ramus with new sclerosis. 6. No new or progressive disease.   05/29/2019 Imaging   CT CAP W Contrast  IMPRESSION: Known gallbladder adenocarcinoma, difficult to compare to recent PET, improved from prior CT.  Upper abdominal/retroperitoneal lymphadenopathy, slightly improved from prior PET.  Osseous metastasis involving the left inferior pubic ramus, grossly unchanged.  No evidence of metastatic disease in the chest.  Additional ancillary findings as above.   08/22/2019 Imaging   CT CAP W Contrast  IMPRESSION: 1. Stable soft tissue mass involving the gallbladder fundus. 2. Stable mild porta hepatis and retroperitoneal lymphadenopathy. 3. Stable  sclerotic bone metastases. 4. No new or progressive metastatic disease identified.  5. Stable 2.3 cm left thyroid lobe nodule.   11/14/2019 Imaging   CT Left Hip WO  contrast  IMPRESSION: 1. 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 Associated soft tissue components have progressed, but there is no evidence of pathologic fracture. 2. No acute osseous findings. 3. 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   12/11/2019 PET scan   IMPRESSION: 1. Significant progression of malignancy, with increased activity in the gallbladder mass; considerable increase in size and activity of porta hepatis, retroperitoneal, and upper pelvic adenopathy; significant increase in size and metabolic activity of the left ischial metastatic lesion with extensive extraosseous component; and new mildly hypermetabolic left supraclavicular and thoracic periaortic lymph nodes suspicious for malignant involvement. 2. Faintly increased activity in a left thyroid nodule which was not previously hypermetabolic. Given the relatively low-grade activity, this may be amenable to surveillance, but a significant minority of thyroid nodules with hypermetabolic activity can represent thyroid cancer. 3. Other imaging findings of potential clinical significance: Chronic paranasal sinusitis. Chronic right middle lobe atelectasis. Aortic Atherosclerosis (ICD10-I70.0). Coronary atherosclerosis with cardiomegaly.   12/17/2019 - 01/07/2020 Radiation Therapy   Palliative Radiation with Dr. Lisbeth Renshaw 12/17/19-01/07/20   01/10/2020 - 04/28/2020 Chemotherapy   Xeloda 1500 mg BID 1 week on/1 week off starting on 01/10/20, she tolerated poorly and developed n/v, weakness, and low po intake. She was instructed to dose reduce to 500 mg BID which she continued into C2. Increased to '1000mg'$  Am and '500mg'$  PM on 02/12/20 and increased to '1000mg'$  BID starting 02/26/20 starting with C4. Last dose taken 04/28/20 for this regimen     04/30/2020 PET scan   IMPRESSION: 1. Increase in size of FDG avid gallbladder tumor. There is also been mild increase in size of FDG avid abdominal nodal metastases. Additionally, there is a new left-sided posterior mediastinal paravertebral FDG avid nodal metastases. 2. Similar appearance of small FDG avid left supraclavicular and subcarinal nodal metastases. Mild decrease in size of large sclerotic metastasis with soft tissue component involving the left acetabulum. 3.  Aortic Atherosclerosis (ICD10-I70.0).   05/06/2020 -  Chemotherapy   Second-line CAPOX q2weeks with Xeloda '1000mg'$  in the AM and '500mg'$  in the PM 1 week on/1 week off.       CURRENT THERAPY:  -Zometa q62monthstarting 01/26/19-11/02/19. Switched to monthly Xgeva injections on 04/03/20.  -Second-line CAPOX q2weeks with Xeloda '1000mg'$  in the AM and '500mg'$  in the PM 1 week on/1 week off.   INTERVAL HISTORY:  Cassie NEYRAis here for a follow up and treatment. She presents to the clinic alone. She notes left hip and upper leg pain when walking. She notes exercise helps this. She notes low appetite due to not feeling hungry and wants something to help her. She would not like to have pump if she can avoid it. She rather try CAPOX first.    REVIEW OF SYSTEMS:   Constitutional: Denies fevers, chills or abnormal weight loss (+) Low appetite  Eyes: Denies blurriness of vision Ears, nose, mouth, throat, and face: Denies mucositis or sore throat Respiratory: Denies cough, dyspnea or wheezes Cardiovascular: Denies palpitation, chest discomfort or lower extremity swelling Gastrointestinal:  Denies nausea, heartburn or change in bowel habits MSK: (+) Left upper leg pain  Skin: Denies abnormal skin rashes Lymphatics: Denies new lymphadenopathy or easy bruising Neurological:Denies numbness, tingling or new weaknesses Behavioral/Psych: Mood is stable, no new changes  All other systems were reviewed with the patient and are  negative.  MEDICAL HISTORY:  Past Medical History:  Diagnosis Date  . Allergic rhinitis   . Arthritis   . Asthma    xolair s 8/05 ?11/07; mastered hfa 12/20/08  . Benign positional vertigo   . CAD (coronary artery disease)    a. 09/2014 NSTEMI s/p LHC with sig 2V dz. dLAD diffusely diseased and not suitable for PCI. unsuccessful RCA PCI d/t heavy calcifications  . gallbladder ca dx'd 10/2018  . GERD (gastroesophageal reflux disease)   . Heart attack ([place])    09/2014  . Hyperlipidemia    <130 ldl pos fm hx, bp  . Hypertension   . Osteopenia    dexa 08/22/07 AP spine + 1.1, left femur -1.3, right femur -.8; dexa 10/06/09 +1.6, left femur =1.6, right femur -.  . PONV (postoperative nausea and vomiting)   . Ruptured disc, cervical   . 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 BREAST SURGERY  10-26-10   Rt. breast bx--for microcalcifications in rt. retroareolar region--dx was hyalinized fibroadenoma  . BUNIONECTOMY Bilateral   . CARDIAC CATHETERIZATION     2015  . CATARACT EXTRACTION Right 02/02/2018   Dr Satira Sark  . CATARACT EXTRACTION Left 03/30/2018  . [place] SURGERY  12/01  . CORONARY ARTERY BYPASS GRAFT N/A 10/04/2018   Procedure: CORONARY ARTERY BYPASS GRAFTING (CABG) times 2 using left  Internal mammary artery to LAD, left greater saphenous vein - open harvest.;  Surgeon: [name], MD;  Location: [place];  Service: Open Heart Surgery;  Laterality: N/A;  . IR IMAGING GUIDED PORT INSERTION  12/08/2018  . LEFT HEART CATH AND CORONARY ANGIOGRAPHY N/A 09/27/2018   Procedure: LEFT HEART CATH AND CORONARY ANGIOGRAPHY;  Surgeon: Troy Sine, MD;  Location: [place] [facility];  Service: Cardiovascular;  Laterality: N/A;  . LEFT HEART CATHETERIZATION WITH CORONARY ANGIOGRAM N/A 10/16/2014   Procedure: LEFT HEART CATHETERIZATION WITH CORONARY ANGIOGRAM;  Surgeon: Blane Ohara, MD;  Location: [facility] CATH LAB;  Service: Cardiovascular;  Laterality: N/A;  . Left L4-5 transforaminal lumbar interbody fusion with Depuy cage, rods and screws, local and allograft bone graft, Vivigen; bilateral decompression/partial hemilaminectomy lumbar five-sacral one  07/2017  . PERCUTANEOUS CORONARY ROTOBLATOR INTERVENTION (PCI-R) N/A 10/17/2014   Procedure: PERCUTANEOUS CORONARY ROTOBLATOR INTERVENTION (PCI-R);  Surgeon: Troy Sine, MD;  Location: [facility] CATH LAB;  Service: Cardiovascular;  Laterality: N/A;  . TEE WITHOUT CARDIOVERSION N/A 10/04/2018   Procedure: TRANSESOPHAGEAL ECHOCARDIOGRAM (TEE);  Surgeon: [name], MD;  Location: [place];  Service: Open Heart Surgery;  Laterality: N/A;  . VEIN LIGATION AND STRIPPING Left   . VIDEO BRONCHOSCOPY Bilateral 02/05/2015   Procedure: VIDEO BRONCHOSCOPY WITHOUT FLUORO;  Surgeon: Tanda Rockers, MD;  Location: [facility];  Service: Endoscopy;  Laterality: Bilateral;  . VULVA /PERINEUM BIOPSY  12-30-10   --epidermoid cyst    I have reviewed the social history and family history with the patient and they are unchanged from previous note.  ALLERGIES:  is allergic to fish-derived products; penicillins; aspirin; brilinta [ticagrelor]; codeine phosphate; and diphenhydramine hcl.  MEDICATIONS:  Current Outpatient Medications  Medication Sig Dispense Refill  . albuterol (PROAIR HFA) 108 (90 Base) MCG/ACT inhaler INHALE 1 TO 2 PUFFS BY MOUTH EVERY 4 HOURS AS NEEDED FOR WHEEZING 8.5 g 3  . atorvastatin (LIPITOR) 40 MG tablet Take 1 tablet (40 mg total) by mouth daily. 90 tablet 3  . bisoprolol (ZEBETA) 10 MG tablet TAKE  1 TABLET(10 MG) BY MOUTH DAILY 90 tablet 2  . budesonide-formoterol (SYMBICORT) 160-4.5 MCG/ACT inhaler INHALE 2 PUFFS BY MOUTH EVERY 12 HOURS 30.6 g 3  . capecitabine (XELODA) 500 MG tablet TAKE 2 TABLETS BY MOUTH EVERY 12 HOURS, 7 DAYS ON AND 7 DAYS OFF 56 tablet 0  . cetirizine (ZYRTEC) 10 MG tablet Take 10 mg  by mouth at bedtime as needed for allergies.    . cholecalciferol 2000 units TABS Take 1 tablet (2,000 Units total) by mouth daily. 30 tablet 3  . clopidogrel (PLAVIX) 75 MG tablet TAKE 1 TABLET(75 MG) BY MOUTH DAILY 90 tablet 3  . dextromethorphan-guaiFENesin (MUCINEX DM) 30-600 MG per 12 hr tablet Take 1-2 tablets by mouth every 12 (twelve) hours as needed for cough (with flutter).     . famotidine (PEPCID) 20 MG tablet Take 20 mg by mouth as needed.     . ferrous sulfate 325 (65 FE) MG tablet Take 650 mg by mouth daily with breakfast.     . fluticasone (FLONASE) 50 MCG/ACT nasal spray Place 1-2 sprays into both nostrils 2 (two) times daily as needed for allergies or rhinitis.    Marland Kitchen lidocaine-prilocaine (EMLA) cream APPLY EXTERNALLY TO THE AFFECTED AREA ONCE DAILY AS DIRECTED 30 g 3  . magic mouthwash SOLN Take 5 mLs by mouth 4 (four) times daily. Leave Benadryl out of compound since pt allergic to it. Use hydrocortisone & nystatin. Swish & swallow or spit for thrush 240 mL 1  . magnesium oxide (MAGNESIUM-OXIDE) 400 (241.3 Mg) MG tablet Take 1 tablet (400 mg total) by mouth 3 (three) times daily. 90 tablet 2  . meclizine (ANTIVERT) 25 MG tablet TAKE 2 TABLETS BY MOUTH THREE TIMES DAILY AS NEEDED 24 tablet 2  . megestrol (MEGACE ES) 625 MG/5ML suspension Take 5 mLs (625 mg total) by mouth daily. 150 mL 0  . montelukast (SINGULAIR) 10 MG tablet TAKE 1 TABLET(10 MG) BY MOUTH AT BEDTIME 90 tablet 1  . Multiple Vitamin (MULTIVITAMIN) capsule Take 1 capsule by mouth daily.     . ondansetron (ZOFRAN) 8 MG tablet Take 1 tablet (8 mg total) by mouth every 8 (eight) hours as needed for nausea or vomiting. 30 tablet 1  . oxyCODONE (OXY IR/ROXICODONE) 5 MG immediate release tablet Take 1 tablet (5 mg total) by mouth every 8 (eight) hours as needed for severe pain. 45 tablet 0  . sacubitril-valsartan (ENTRESTO) 24-26 MG Take 1 tablet by mouth 2 (two) times daily. 180 tablet 3   No current facility-administered  medications for this visit.    PHYSICAL EXAMINATION: ECOG PERFORMANCE STATUS: 1 - Symptomatic but completely ambulatory  Vitals:   05/01/20 0920  BP: 131/78  Pulse: 65  Resp: 18  Temp: 98.2 F (36.8 C)  SpO2: 100%   Filed Weights   05/01/20 0920  Weight: 117 lb 11.2 oz (53.4 kg)    Due to COVID19 we will limit examination to appearance. Patient had no complaints.  GENERAL:alert, no distress and comfortable SKIN: skin color normal, no rashes or significant lesions EYES: normal, Conjunctiva are pink and non-injected, sclera clear  NEURO: alert & oriented x 3 with fluent speech   LABORATORY DATA:  I have reviewed the data as listed CBC Latest Ref Rng & Units 04/29/2020 04/03/2020 03/06/2020  WBC 4.0 - 10.5 K/uL 4.9 4.4 4.2  Hemoglobin 12.0 - 15.0 g/dL 11.4(L) 11.2(L) 11.9(L)  Hematocrit 36.0 - 46.0 % 34.1(L) 34.1(L) 36.3  Platelets 150 - 400 K/uL 321 324  378     CMP Latest Ref Rng & Units 04/29/2020 04/03/2020 03/06/2020  Glucose 70 - 99 mg/dL 104(H) 103(H) 101(H)  BUN 8 - 23 mg/dL _0 Creatinine 0.44 - 1.00 mg/dL 1.17(H) 1.17(H) 1.07(H)  Sodium 135 - 145 mmol/L 139 139 141  Potassium 3.5 - 5.1 mmol/L 4.4 4.1 4.2  Chloride 98 - 111 mmol/L 105 105 103  CO2 22 - 32 mmol/L _1 Calcium 8.9 - 10.3 mg/dL 9.7 9.4 9.7  Total Protein 6.5 - 8.1 g/dL 8.3(H) 8.3(H) 8.4(H)  Total Bilirubin 0.3 - 1.2 mg/dL 0.5 0.4 0.4  Alkaline Phos 38 - 126 U/L 55 57 55  AST 15 - 41 U/L _2 ALT 0 - 44 U/L _3 RADIOGRAPHIC STUDIES: I have personally reviewed the radiological images as listed and agreed with the findings in the report. NM PET Image Restag (PS) Skull Base To Thigh  Result Date: 04/30/2020 CLINICAL DATA:  Subsequent treatment strategy for gallbladder carcinoma. EXAM: NUCLEAR MEDICINE PET SKULL BASE TO THIGH TECHNIQUE: 5.79 mCi F-18 FDG was injected intravenously. Full-ring PET imaging was performed from the skull base to thigh after the radiotracer. CT  data was obtained and used for attenuation correction and anatomic localization. Fasting blood glucose: 93 mg/dl COMPARISON:  12/11/2019 FINDINGS: Mediastinal blood pool activity: SUV max 2.2 Liver activity: SUV max NA NECK: No hypermetabolic lymph nodes in the neck. Incidental CT findings: none CHEST: 7 mm left supraclavicular lymph node has an SUV max of 4.6, image 39/4. This is compared with 1 cm with SUV max of 5.0. Subcentimeter lymph node posterior to the left mainstem bronchus measures approximately 7 mm with SUV max of 4.4, image 64/4. Previously 6 mm within SUV max of 4.6. Paravertebral lymph node posterior to the descending aorta measures 1 cm with SUV max of 9.3, image 86/4. This is new compared with the previous exam. No FDG avid pulmonary nodules. Incidental CT findings: Cardiac enlargement and aortic atherosclerosis. Previous median sternotomy and CABG procedure. ABDOMEN/PELVIS: Gallbladder mass measures 3.8 x 4.0 cm with SUV max of 14.3, image 109/4. Previously 2.6 x 2.8 cm within SUV max of 9.7. Extensive FDG avid abdominal lymph nodes are again noted. Nodal mass within the porta hepatic region has an SUV max of 11.8. Previously 12.05. Left retroperitoneal lymph node measures 2.1 cm within SUV max of 10.18, image 115/4. Previously 1.8 cm with SUV max of 12.2. Retrocaval lymph node measures 1.5 cm with SUV max of 13.5, image 111/4. Previously 1.3 cm with SUV max of 15.5. Left periaortic node just above the bifurcation measures 2.2 cm with SUV max of 12.9, image 125/4. Previously 1.7 cm within SUV max of 16.3. Right common iliac node measures 1.3 cm, image 131/4. Previously this measured 1.2 cm with SUV max of 15.3. Incidental CT findings: Aortic atherosclerosis without aneurysm. SKELETON: FDG avid sclerotic metastasis with considerable soft tissue component involving the left acetabulum is again noted. This measures 6.6 cm with SUV max of 19.96. Previously this measured 8.2 cm within SUV max of 24.98.  no additional FDG avid skeletal metastases identified. Incidental CT findings: none IMPRESSION: 1. Increase in size of FDG avid gallbladder tumor. There is also been mild increase in size of FDG avid abdominal nodal metastases. Additionally, there is a new left-sided posterior mediastinal paravertebral FDG avid nodal metastases. 2. Similar appearance of small FDG avid left supraclavicular and subcarinal nodal metastases. Mild decrease in size  of large sclerotic metastasis with soft tissue component involving the left acetabulum. 3.  Aortic Atherosclerosis (ICD10-I70.0). Electronically Signed   By: Kerby Moors M.D.   On: 04/30/2020 15:34     ASSESSMENT & PLAN:  SHERBY MONCAYO is a 79 y.o. 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  -She is also onZometa q38monthstarting 01/26/19. Switched to monthly Xgeva on 04/03/20 due to kidney function.  -UnfortunatelyPET from 12/22/20showedsignificant progression of diffuse nodal metastasis, increased activity of gallbladder mass, and increased size and metabolic activity of left ischial lesion. Cisplatin and gemcitabine was discontinued.  -She began Xeloda 1500 mg BID 1 week on/1 week off on 1/21, she tolerated poorly and developed n/v, weakness, and low po intake.She was able to slowly titrate up from low dose '500mg'$  BID to '1000mg'$  BID by C4 on 02/26/20.  -I personally reviewed and discussed her PET from 04/30/20 with pt which shows increased size of gallbladder tumor and abdominal nodal metastasis. She does have new posterior mediastinal nodal metastasis. Overall scan shows disease progression.  -I discussed next line options of FOLFOX every 2 weeks. We discussed that there is no other   standard treatment options, although we can consider gem/abraxane, FOLFIRI etc but these are probably less effective and clinically data is limited. I also discussed the targetable anti-HER2 treatment would further effect her current decreased heart function, although I may ocnsider lapatinib, TD-M1 or ticatinib etc which causes less cardiac toxicities and Herceptin -After lengthy discussion, patient is very reluctant to consider 5-FU pump infusion, she agrees to try CAPOX.  Due to her advanced age and limited social support, I will do low-dose oxaliplatin at '60mg'$ /m2, and Xeloda '1000mg'$  am and '500mg'$  pm, 1 week on and 1 week off.  --Chemotherapy consent: Side effects including but does not limited to, fatigue, nausea, vomiting, diarrhea, hair loss, neuropathy, fluid retention, renal and kidney dysfunction, neutropenic fever, needed for blood transfusion, bleeding, were discussed with patient in great detail.  -We again discussed the goal of therapy is palliative, to prolong her life, and improve her quality of life -Plan to start first cycle CAPOX next week  -Proceed with Xgeva injection today and continue monthly  -I will f/u with phone visit 1 week after start of C1. F/u on 05/21/20 with C2.    2.Left upper leg pain -PET on 12/22 showed significant increase in size and metabolic activity of the lesion and extraosseous component. I previously discussed her pain is likely related to her cancer. -For her Pain she completedpalliative RT per Dr. MDanley Danker12/28/20 to 01/08/20 -Pain and weakness resolved after radiation, does not currently requireoxycodone anymoreand uses cane still. -Her pain has recurred recently but dose improve with exercise.   3. CAD, S/P CABGX2 on 09/27/2018, EF25-30% -Currently on Plavix.  -f/u with Dr. KClaiborne Billings-recent ECHO showed worsening EF  4. Asthma, dyspnea -Currently on Symbicort, Singulair, and albuterol as needed -f/u with pulmonary, stable, no recent  flare -She has recently beenlittle more dyspneic. She can use dexa after chemo, and as needed for worsening wheezing  5.Goal of care discussion -The patient understands the goal of care is palliative. -she is full code for now  6. Mild Anemia -Secondary to chemoand her recent open heart surgery -I discussed her anemia may contribute  to her mild SOB. -She has not taken oral iron because it causes bloating. I encouraged her to take prenatal vitamin. -Previously normal, but mildly low lately. Stable   7. Weight loss, Low appetite  -Her weight continues to trend down on Xeloda  -She does have postprandial stomach aches, otherwise no other pain. If more persistent I suggest she use her Pepcid more regularly. -I previously offered her Dietician referral, she declined.  -Her weight has been stable lately but her appetite has decreased as she does not feel hungry. I will call in low dose Megace today to help her (05/01/20). She is agreeable.   8. Elevated Cr  -Cr has been elevated since being on Xeloda.  -Lasix has been held  -I encouraged her to drink enough water to manage her heart function and her Cr level. -Stable    Plan -I called in low dose Megace today for her anorexia  -Proceed with Xgeva injection today and continue monthly  -Lab, flush and Oxaliplatin on 5/18 -Phone call 1 week after 5/18 -Start Xeloda 1035m in the AM and 5024min the PM 1 week on/1 week off on 5/18 -Lab, flush, f/u and Oxaliplatin on 6/2 or 6/3   No problem-specific Assessment & Plan notes found for this encounter.   No orders of the defined types were placed in this encounter.  All questions were answered. The patient knows to call the clinic with any problems, questions or concerns. No barriers to learning was detected. The total time spent in the appointment was 45 minutes.     YaTruitt MerleMD 05/01/2020   I, AmJoslyn Devonam acting as scribe for YaTruitt MerleMD.   I have reviewed  the above documentation for accuracy and completeness, and I agree with the above.

## 2020-05-01 ENCOUNTER — Other Ambulatory Visit: Payer: Self-pay

## 2020-05-01 ENCOUNTER — Inpatient Hospital Stay (HOSPITAL_BASED_OUTPATIENT_CLINIC_OR_DEPARTMENT_OTHER): Payer: Medicare Other | Admitting: Hematology

## 2020-05-01 ENCOUNTER — Encounter: Payer: Self-pay | Admitting: Hematology

## 2020-05-01 ENCOUNTER — Inpatient Hospital Stay: Payer: Medicare Other

## 2020-05-01 VITALS — BP 131/78 | HR 65 | Temp 98.2°F | Resp 18 | Ht 59.0 in | Wt 117.7 lb

## 2020-05-01 DIAGNOSIS — Z5111 Encounter for antineoplastic chemotherapy: Secondary | ICD-10-CM | POA: Diagnosis not present

## 2020-05-01 DIAGNOSIS — C23 Malignant neoplasm of gallbladder: Secondary | ICD-10-CM

## 2020-05-01 DIAGNOSIS — C7951 Secondary malignant neoplasm of bone: Secondary | ICD-10-CM | POA: Diagnosis not present

## 2020-05-01 DIAGNOSIS — I2510 Atherosclerotic heart disease of native coronary artery without angina pectoris: Secondary | ICD-10-CM | POA: Diagnosis not present

## 2020-05-01 MED ORDER — DENOSUMAB 120 MG/1.7ML ~~LOC~~ SOLN
SUBCUTANEOUS | Status: AC
  Filled 2020-05-01: qty 1.7

## 2020-05-01 MED ORDER — DENOSUMAB 120 MG/1.7ML ~~LOC~~ SOLN
120.0000 mg | Freq: Once | SUBCUTANEOUS | Status: AC
  Administered 2020-05-01: 120 mg via SUBCUTANEOUS

## 2020-05-01 MED ORDER — MEGESTROL ACETATE 625 MG/5ML PO SUSP: 625.0000 mg | Freq: Every day | ORAL | 0 refills | Status: AC

## 2020-05-01 NOTE — Progress Notes (Signed)
  Radiation Oncology         [phone] ________________________________  Name: Cassie Campbell MRN: [id number]  Date: 01/08/2020  DOB: 1941-05-27  End of Treatment Note  Diagnosis:   Bone metastasis     Indication for treatment::  palliative       Radiation treatment dates:   12/17/19 - 01/08/20  Site/dose:   The left pelvis was treated to a dose of 37.5 Gy in 15 fractions using a 3-field isodose technique.    Narrative: The patient tolerated radiation treatment relatively well.     Plan: The patient has completed radiation treatment. The patient will return to radiation oncology clinic for routine followup in one month. I advised the patient to call or return sooner if they have any questions or concerns related to their recovery or treatment. ________________________________  Jodelle Gross, M.D., Ph.D.

## 2020-05-01 NOTE — Progress Notes (Signed)
DISCONTINUE OFF PATHWAY REGIMEN - Other   [id number]:Trastuzumab and hyaluronidase-oysk 600 mg/10,000 units SUBQ D1 q21 Days:   A cycle is every 21 days:     Trastuzumab and hyaluronidase-oysk   **Always confirm dose/schedule in your pharmacy ordering system**  REASON: Other Reason PRIOR TREATMENT: Trastuzumab and hyaluronidase-oysk 600 mg/10,000 units SUBQ D1 q21 Days TREATMENT RESPONSE: Unable to Evaluate  START OFF PATHWAY REGIMEN - Other   [id number]:Capecitabine + Oxaliplatin (850/130):   A cycle is every 21 days:     Capecitabine      Oxaliplatin   **Always confirm dose/schedule in your pharmacy ordering system**  Patient Characteristics: Intent of Therapy: Non-Curative / Palliative Intent, Discussed with Patient

## 2020-05-05 ENCOUNTER — Telehealth: Payer: Self-pay | Admitting: Internal Medicine

## 2020-05-05 ENCOUNTER — Other Ambulatory Visit: Payer: Self-pay | Admitting: Hematology

## 2020-05-05 DIAGNOSIS — R7309 Other abnormal glucose: Secondary | ICD-10-CM

## 2020-05-05 NOTE — Telephone Encounter (Signed)
Spoke with [employer]. She is aware of MW's recommendations. Advised her that I would reach out to the patient to get her scheduled for an OV.   She has been scheduled to see TP on 5/21 at 9am. She is aware to go to the old office for labs.   Nothing further needed at time of call.

## 2020-05-05 NOTE — Telephone Encounter (Signed)
That is not enough to restart rx  Needs ov with NP with all meds and fasting bmet  In meantime avoid sweets and starchy foods

## 2020-05-05 NOTE — Telephone Encounter (Signed)
Patient had an A1C drawn today It showed patient is diabetic , patient has never been diagnosed diabetic before. Patient states that she uses you as her PCP  Dr. Melvyn Novas please advise if further recommendations for patient.

## 2020-05-07 ENCOUNTER — Inpatient Hospital Stay: Payer: Medicare Other

## 2020-05-07 ENCOUNTER — Other Ambulatory Visit: Payer: Self-pay

## 2020-05-07 VITALS — BP 138/68 | HR 65 | Temp 98.2°F | Resp 18 | Wt 119.2 lb

## 2020-05-07 DIAGNOSIS — C23 Malignant neoplasm of gallbladder: Secondary | ICD-10-CM

## 2020-05-07 DIAGNOSIS — Z7189 Other specified counseling: Secondary | ICD-10-CM

## 2020-05-07 DIAGNOSIS — Z5111 Encounter for antineoplastic chemotherapy: Secondary | ICD-10-CM | POA: Diagnosis not present

## 2020-05-07 LAB — CBC WITH DIFFERENTIAL (CANCER CENTER ONLY)
Abs Immature Granulocytes: 0.03 10*3/uL (ref 0.00–0.07)
Basophils Absolute: 0 10*3/uL (ref 0.0–0.1)
Basophils Relative: 0 %
Eosinophils Absolute: 0.2 10*3/uL (ref 0.0–0.5)
Eosinophils Relative: 4 %
HCT: 31.7 % — ABNORMAL LOW (ref 36.0–46.0)
Hemoglobin: 10.7 g/dL — ABNORMAL LOW (ref 12.0–15.0)
Immature Granulocytes: 1 %
Lymphocytes Relative: 12 %
Lymphs Abs: 0.6 10*3/uL — ABNORMAL LOW (ref 0.7–4.0)
MCH: 34 pg (ref 26.0–34.0)
MCHC: 33.8 g/dL (ref 30.0–36.0)
MCV: 100.6 fL — ABNORMAL HIGH (ref 80.0–100.0)
Monocytes Absolute: 0.5 10*3/uL (ref 0.1–1.0)
Monocytes Relative: 10 %
Neutro Abs: 3.8 10*3/uL (ref 1.7–7.7)
Neutrophils Relative %: 73 %
Platelet Count: 265 10*3/uL (ref 150–400)
RBC: 3.15 MIL/uL — ABNORMAL LOW (ref 3.87–5.11)
RDW: 19.8 % — ABNORMAL HIGH (ref 11.5–15.5)
WBC Count: 5.3 10*3/uL (ref 4.0–10.5)
nRBC: 0 % (ref 0.0–0.2)

## 2020-05-07 LAB — CMP (CANCER CENTER ONLY)
ALT: 16 U/L (ref 0–44)
AST: 30 U/L (ref 15–41)
Albumin: 3.3 g/dL — ABNORMAL LOW (ref 3.5–5.0)
Alkaline Phosphatase: 52 U/L (ref 38–126)
Anion gap: 9 (ref 5–15)
BUN: 15 mg/dL (ref 8–23)
CO2: 24 mmol/L (ref 22–32)
Calcium: 9.7 mg/dL (ref 8.9–10.3)
Chloride: 107 mmol/L (ref 98–111)
Creatinine: 1.12 mg/dL — ABNORMAL HIGH (ref 0.44–1.00)
GFR, Est AFR Am: 55 mL/min — ABNORMAL LOW (ref >60)
GFR, Estimated: 47 mL/min — ABNORMAL LOW (ref >60)
Glucose, Bld: 91 mg/dL (ref 70–99)
Potassium: 4.2 mmol/L (ref 3.5–5.1)
Sodium: 140 mmol/L (ref 135–145)
Total Bilirubin: 0.5 mg/dL (ref 0.3–1.2)
Total Protein: 7.8 g/dL (ref 6.5–8.1)

## 2020-05-07 LAB — MAGNESIUM: Magnesium: 1.6 mg/dL — ABNORMAL LOW (ref 1.7–2.4)

## 2020-05-07 MED ORDER — PALONOSETRON HCL INJECTION 0.25 MG/5ML
0.2500 mg | Freq: Once | INTRAVENOUS | Status: AC
  Administered 2020-05-07: 0.25 mg via INTRAVENOUS

## 2020-05-07 MED ORDER — HEPARIN SOD (PORK) LOCK FLUSH 100 UNIT/ML IV SOLN
500.0000 [IU] | Freq: Once | INTRAVENOUS | Status: AC | PRN
  Administered 2020-05-07: 500 [IU]
  Filled 2020-05-07: qty 5

## 2020-05-07 MED ORDER — DEXTROSE 5 % IV SOLN
Freq: Once | INTRAVENOUS | Status: AC
  Filled 2020-05-07: qty 250

## 2020-05-07 MED ORDER — SODIUM CHLORIDE 0.9 % IV SOLN
10.0000 mg | Freq: Once | INTRAVENOUS | Status: AC
  Administered 2020-05-07: 10 mg via INTRAVENOUS
  Filled 2020-05-07: qty 10

## 2020-05-07 MED ORDER — PALONOSETRON HCL INJECTION 0.25 MG/5ML
INTRAVENOUS | Status: AC
  Filled 2020-05-07: qty 5

## 2020-05-07 MED ORDER — SODIUM CHLORIDE 0.9% FLUSH
10.0000 mL | INTRAVENOUS | Status: DC | PRN
  Administered 2020-05-07: 10 mL
  Filled 2020-05-07: qty 10

## 2020-05-07 MED ORDER — OXALIPLATIN CHEMO INJECTION 100 MG/20ML
60.0000 mg/m2 | Freq: Once | INTRAVENOUS | Status: AC
  Administered 2020-05-07: 90 mg via INTRAVENOUS
  Filled 2020-05-07: qty 18

## 2020-05-07 NOTE — Patient Instructions (Signed)
[facility] Discharge Instructions for Patients Receiving Chemotherapy  Today you received the following chemotherapy agents: Oxaliplatin.  To help prevent nausea and vomiting after your treatment, we encourage you to take your nausea medication as prescribed. NO ZOFRAN FOR 3 DAYS AFTER CHEMO. May take Compazine if needed for nausea.   If you develop nausea and vomiting that is not controlled by your nausea medication, call the clinic.   BELOW ARE SYMPTOMS THAT SHOULD BE REPORTED IMMEDIATELY:  *FEVER GREATER THAN 100.5 F  *CHILLS WITH OR WITHOUT FEVER  NAUSEA AND VOMITING THAT IS NOT CONTROLLED WITH YOUR NAUSEA MEDICATION  *UNUSUAL SHORTNESS OF BREATH  *UNUSUAL BRUISING OR BLEEDING  TENDERNESS IN MOUTH AND THROAT WITH OR WITHOUT PRESENCE OF ULCERS  *URINARY PROBLEMS  *BOWEL PROBLEMS  UNUSUAL RASH Items with * indicate a potential emergency and should be followed up as soon as possible.  Feel free to call the clinic should you have any questions or concerns. The clinic phone number is [phone].  Please show the [facility] at check-in to the Emergency Department and triage nurse.  Oxaliplatin Injection What is this medicine? OXALIPLATIN (ox AL i PLA tin) is a chemotherapy drug. It targets fast dividing cells, like cancer cells, and causes these cells to die. This medicine is used to treat cancers of the colon and rectum, and many other cancers. This medicine may be used for other purposes; ask your health care provider or pharmacist if you have questions. COMMON BRAND NAME(S): Eloxatin What should I tell my health care provider before I take this medicine? They need to know if you have any of these conditions:  heart disease  history of irregular heartbeat  liver disease  low blood counts, like white cells, platelets, or red blood cells  lung or breathing disease, like asthma  take medicines that treat or prevent blood clots  tingling of  the fingers or toes, or other nerve disorder  an unusual or allergic reaction to oxaliplatin, other chemotherapy, other medicines, foods, dyes, or preservatives  pregnant or trying to get pregnant  breast-feeding How should I use this medicine? This drug is given as an infusion into a vein. It is administered in a hospital or clinic by a specially trained health care professional. Talk to your pediatrician regarding the use of this medicine in children. Special care may be needed. Overdosage: If you think you have taken too much of this medicine contact a poison control center or emergency room at once. NOTE: This medicine is only for you. Do not share this medicine with others. What if I miss a dose? It is important not to miss a dose. Call your doctor or health care professional if you are unable to keep an appointment. What may interact with this medicine? Do not take this medicine with any of the following medications:  cisapride  dronedarone  pimozide  thioridazine This medicine may also interact with the following medications:  aspirin and aspirin-like medicines  certain medicines that treat or prevent blood clots like warfarin, apixaban, dabigatran, and rivaroxaban  cisplatin  cyclosporine  diuretics  medicines for infection like acyclovir, adefovir, amphotericin B, bacitracin, cidofovir, foscarnet, ganciclovir, gentamicin, pentamidine, vancomycin  NSAIDs, medicines for pain and inflammation, like ibuprofen or naproxen  other medicines that prolong the QT interval (an abnormal heart rhythm)  pamidronate  zoledronic acid This list may not describe all possible interactions. Give your health care provider a list of all the medicines, herbs, non-prescription drugs, or  dietary supplements you use. Also tell them if you smoke, drink alcohol, or use illegal drugs. Some items may interact with your medicine. What should I watch for while using this medicine? Your  condition will be monitored carefully while you are receiving this medicine. You may need blood work done while you are taking this medicine. This medicine may make you feel generally unwell. This is not uncommon as chemotherapy can affect healthy cells as well as cancer cells. Report any side effects. Continue your course of treatment even though you feel ill unless your healthcare professional tells you to stop. This medicine can make you more sensitive to cold. Do not drink cold drinks or use ice. Cover exposed skin before coming in contact with cold temperatures or cold objects. When out in cold weather wear warm clothing and cover your mouth and nose to warm the air that goes into your lungs. Tell your doctor if you get sensitive to the cold. Do not become pregnant while taking this medicine or for 9 months after stopping it. Women should inform their health care professional if they wish to become pregnant or think they might be pregnant. Men should not father a child while taking this medicine and for 6 months after stopping it. There is potential for serious side effects to an unborn child. Talk to your health care professional for more information. Do not breast-feed a child while taking this medicine or for 3 months after stopping it. This medicine has caused ovarian failure in some women. This medicine may make it more difficult to get pregnant. Talk to your health care professional if you are concerned about your fertility. This medicine has caused decreased sperm counts in some men. This may make it more difficult to father a child. Talk to your health care professional if you are concerned about your fertility. This medicine may increase your risk of getting an infection. Call your health care professional for advice if you get a fever, chills, or sore throat, or other symptoms of a cold or flu. Do not treat yourself. Try to avoid being around people who are sick. Avoid taking medicines that  contain aspirin, acetaminophen, ibuprofen, naproxen, or ketoprofen unless instructed by your health care professional. These medicines may hide a fever. Be careful brushing or flossing your teeth or using a toothpick because you may get an infection or bleed more easily. If you have any dental work done, tell your dentist you are receiving this medicine. What side effects may I notice from receiving this medicine? Side effects that you should report to your doctor or health care professional as soon as possible:  allergic reactions like skin rash, itching or hives, swelling of the face, lips, or tongue  breathing problems  cough  low blood counts - this medicine may decrease the number of white blood cells, red blood cells, and platelets. You may be at increased risk for infections and bleeding  nausea, vomiting  pain, redness, or irritation at site where injected  pain, tingling, numbness in the hands or feet  signs and symptoms of bleeding such as bloody or black, tarry stools; red or dark brown urine; spitting up blood or brown material that looks like coffee grounds; red spots on the skin; unusual bruising or bleeding from the eyes, gums, or nose  signs and symptoms of a dangerous change in heartbeat or heart rhythm like chest pain; dizziness; fast, irregular heartbeat; palpitations; feeling faint or lightheaded; falls  signs and symptoms of infection  like fever; chills; cough; sore throat; pain or trouble passing urine  signs and symptoms of liver injury like dark yellow or brown urine; general ill feeling or flu-like symptoms; light-colored stools; loss of appetite; nausea; right upper belly pain; unusually weak or tired; yellowing of the eyes or skin  signs and symptoms of low red blood cells or anemia such as unusually weak or tired; feeling faint or lightheaded; falls  signs and symptoms of muscle injury like dark urine; trouble passing urine or change in the amount of urine;  unusually weak or tired; muscle pain; back pain Side effects that usually do not require medical attention (report to your doctor or health care professional if they continue or are bothersome):  changes in taste  diarrhea  gas  hair loss  loss of appetite  mouth sores This list may not describe all possible side effects. Call your doctor for medical advice about side effects. You may report side effects to FDA at 1-800-FDA-1088. Where should I keep my medicine? This drug is given in a hospital or clinic and will not be stored at home. NOTE: This sheet is a summary. It may not cover all possible information. If you have questions about this medicine, talk to your doctor, pharmacist, or health care provider.  2020 [employer]vier/Gold Standard (2019-04-25 12:20:35)

## 2020-05-08 ENCOUNTER — Telehealth: Payer: Self-pay | Admitting: *Deleted

## 2020-05-09 ENCOUNTER — Encounter: Payer: Self-pay | Admitting: Adult Health

## 2020-05-09 ENCOUNTER — Ambulatory Visit: Payer: Medicare Other | Admitting: Adult Health

## 2020-05-09 ENCOUNTER — Other Ambulatory Visit: Payer: Self-pay

## 2020-05-09 ENCOUNTER — Other Ambulatory Visit (INDEPENDENT_AMBULATORY_CARE_PROVIDER_SITE_OTHER): Payer: Medicare Other

## 2020-05-09 DIAGNOSIS — I5020 Unspecified systolic (congestive) heart failure: Secondary | ICD-10-CM

## 2020-05-09 DIAGNOSIS — C23 Malignant neoplasm of gallbladder: Secondary | ICD-10-CM

## 2020-05-09 DIAGNOSIS — R7309 Other abnormal glucose: Secondary | ICD-10-CM | POA: Diagnosis not present

## 2020-05-09 DIAGNOSIS — I2510 Atherosclerotic heart disease of native coronary artery without angina pectoris: Secondary | ICD-10-CM | POA: Diagnosis not present

## 2020-05-09 DIAGNOSIS — J4540 Moderate persistent asthma, uncomplicated: Secondary | ICD-10-CM | POA: Diagnosis not present

## 2020-05-09 DIAGNOSIS — E1169 Type 2 diabetes mellitus with other specified complication: Secondary | ICD-10-CM

## 2020-05-09 DIAGNOSIS — I10 Essential (primary) hypertension: Secondary | ICD-10-CM

## 2020-05-09 DIAGNOSIS — E119 Type 2 diabetes mellitus without complications: Secondary | ICD-10-CM | POA: Insufficient documentation

## 2020-05-09 LAB — BASIC METABOLIC PANEL
BUN: 17 mg/dL (ref 6–23)
CO2: 26 mEq/L (ref 19–32)
Calcium: 10.4 mg/dL (ref 8.4–10.5)
Chloride: 104 mEq/L (ref 96–112)
Creatinine, Ser: 1.11 mg/dL (ref 0.40–1.20)
GFR: 57.57 mL/min — ABNORMAL LOW (ref >60.00)
Glucose, Bld: 103 mg/dL — ABNORMAL HIGH (ref 70–99)
Potassium: 3.9 mEq/L (ref 3.5–5.1)
Sodium: 137 mEq/L (ref 135–145)

## 2020-05-09 NOTE — Assessment & Plan Note (Signed)
Appears euvolemic on exam no evidence of overt heart failure Continue with follow-up with cardiology and current regimen

## 2020-05-09 NOTE — Assessment & Plan Note (Signed)
Appears stable on present Respess with no flare.  Continue on current regimen

## 2020-05-09 NOTE — Patient Instructions (Addendum)
Low sweet diet.  Will check labs on return for Diabetes. .  Continue on Symbicort and Singulair .  Albuterol Inhaler As needed    Continue on low fat cholesterol diet   Activity as tolerated.  Follow up Dr. Melvyn Novas  In 3 months and As needed  With labs  Please contact office for sooner follow up if symptoms do not improve or worsen or seek emergency care

## 2020-05-09 NOTE — Assessment & Plan Note (Signed)
Currently under good control continue low-salt diet

## 2020-05-09 NOTE — Assessment & Plan Note (Signed)
New onset diabetes with recent screening A1c through [employer].  Fasting blood sugars are 103 today.  A1c was 6.6.  We discussed diet therapy for now.  Patient is to decrease the sweets in her diet.  We will recheck an A1c in 3 months.  Hold on medications for now.  Also check a urine on return looking for proteinuria.  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

## 2020-05-09 NOTE — Assessment & Plan Note (Signed)
Continue with follow-up with cardiology and current regimen

## 2020-05-09 NOTE — Progress Notes (Signed)
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 Lymph node, biopsy, mediastinal - LYMPH NODE WITH METASTATIC ADENOCARCINOMA. - SEE MICROSCOPIC DESCRIPTION 2. Plaque, coronary artery - CALCIFIED ATHEROSCLEROTIC PLAQUE.   10/14/2018 Miscellaneous   Foundation One:  MSI stable tumor mutation burden 3Muts/mb ERBB2 amplification CCNE1 amplification TP53 mutation(+) CDK6 amplification  HGF amplification    11/13/2018 Cancer Staging   Staging form: Gallbladder, AJCC 8th Edition - Clinical stage from 11/13/2018: Stage IVB (cTX, cN2, pM1) - Signed by Truitt Merle, MD on 12/15/2018   11/17/2018 Imaging   11/17/2018 CT CAP IMPRESSION: 1. 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 This is associated with extensive upper abdominal and retroperitoneal lymphadenopathy, as well as metastatic lymphadenopathy in the posterior mediastinum and left supraclavicular region. There is also a metastatic lesion to the left ischium. 2. 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 3. Aortic atherosclerosis, in addition to left main and 3 vessel coronary artery disease. Status post median sternotomy for CABG including LIMA to the LAD. 4. There are calcifications of the aortic valve. Echocardiographic correlation for  evaluation of potential valvular dysfunction may be warranted if clinically indicated.    11/21/2018 Initial Diagnosis   Gallbladder cancer ([place])   11/27/2018 Pathology Results   11/27/2018 CA19-9 immunohistochemical stain Per request, a CA19-9 immunohistochemical stain was performed at an outside institution revealing positive staining in the tumor cells.    12/15/2018 - 11/30/2019 Chemotherapy   First line chemo cisplatin and gemcitabine every 2 weeks on 12/15/18. Had 1 month chemo break in April, restarted on 04/20/19. Stopped 11/30/19 due to disease progression.    01/26/2019 -  Chemotherapy   Zometa q75month starting 01/26/19-11/02/19. Switched to monthly Xgeva injections on 04/03/20   02/20/2019 PET scan   Restaging PET scan:  IMPRESSION: 1. Positive response to therapy at all primary and metastatic sites. Persistent primary and metastatic lesions do have intense hypermetabolic activity albeit reduced. 2. Decrease in size and hypermetabolic activity of mass lesion in the gallbladder fundus. 3. Interval decrease in size and metabolic activity of periportal adenopathy. 4. Decrease in size and metabolic activity of periaortic lymph nodes. 5. Interval decrease in size and metabolic activity of destructive lesion in the LEFT inferior pubic ramus with new sclerosis. 6. No new or progressive disease.   05/29/2019 Imaging   CT CAP W Contrast  IMPRESSION: Known gallbladder adenocarcinoma, difficult to compare to recent PET, improved from prior CT.  Upper abdominal/retroperitoneal lymphadenopathy, slightly improved from prior PET.  Osseous metastasis involving the left inferior pubic ramus, grossly unchanged.  No evidence of metastatic disease in the chest.  Additional ancillary findings as above.   08/22/2019 Imaging   CT CAP W Contrast  IMPRESSION: 1. Stable soft tissue mass involving the gallbladder fundus. 2. Stable mild porta hepatis and retroperitoneal  lymphadenopathy. 3. Stable sclerotic bone metastases. 4. No new or progressive metastatic  disease identified. 5. Stable 2.3 cm left thyroid lobe nodule.   11/14/2019 Imaging   CT Left Hip WO  contrast  IMPRESSION: 1. 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 Associated soft tissue components have progressed, but there is no evidence of pathologic fracture. 2. No acute osseous findings. 3. 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   12/11/2019 PET scan   IMPRESSION: 1. Significant progression of malignancy, with increased activity in the gallbladder mass; considerable increase in size and activity of porta hepatis, retroperitoneal, and upper pelvic adenopathy; significant increase in size and metabolic activity of the left ischial metastatic lesion with extensive extraosseous component; and new mildly hypermetabolic left supraclavicular and thoracic periaortic lymph nodes suspicious for malignant involvement. 2. Faintly increased activity in a left thyroid nodule which was not previously hypermetabolic. Given the relatively low-grade activity, this may be amenable to surveillance, but a significant minority of thyroid nodules with hypermetabolic activity can represent thyroid cancer. 3. Other imaging findings of potential clinical significance: Chronic paranasal sinusitis. Chronic right middle lobe atelectasis. Aortic Atherosclerosis (ICD10-I70.0). Coronary atherosclerosis with cardiomegaly.   12/17/2019 - 01/07/2020 Radiation Therapy   Palliative Radiation with Dr. Lisbeth Renshaw 12/17/19-01/07/20   01/10/2020 - 04/28/2020 Chemotherapy   Xeloda 1500 mg BID 1 week on/1 week off starting on 01/10/20, she tolerated poorly and developed n/v, weakness, and low po intake. She was instructed to dose reduce to 500 mg BID which she continued into C2. Increased to 1023m Am and 5060mPM on 02/12/20 and increased to 100069mID starting 02/26/20 starting with C4. Last dose taken  04/28/20 for this regimen   04/30/2020 PET scan   IMPRESSION: 1. Increase in size of FDG avid gallbladder tumor. There is also been mild increase in size of FDG avid abdominal nodal metastases. Additionally, there is a new left-sided posterior mediastinal paravertebral FDG avid nodal metastases. 2. Similar appearance of small FDG avid left supraclavicular and subcarinal nodal metastases. Mild decrease in size of large sclerotic metastasis with soft tissue component involving the left acetabulum. 3.  Aortic Atherosclerosis (ICD10-I70.0).   05/06/2020 -  Chemotherapy   Second-line CAPOX q2weeks with Xeloda 1000m44m the AM and 500mg26mthe PM 1 week on/1 week off.    05/07/2020 -  Chemotherapy   The patient had palonosetron (ALOXI) injection 0.25 mg, 0.25 mg, Intravenous,  Once, 1 of 8 cycles Administration: 0.25 mg (05/07/2020) oxaliplatin (ELOXATIN) 90 mg in dextrose 5 % 500 mL chemo infusion, 60 mg/m2 = 90 mg (100 % of original dose 60 mg/m2), Intravenous,  Once, 1 of 8 cycles Dose modification: 60 mg/m2 (original dose 60 mg/m2, Cycle 1, Reason: Provider Judgment) Administration: 90 mg (05/07/2020)  for chemotherapy treatment.       CURRENT THERAPY:  -Zometa q3mont58monthing 01/26/19-11/02/19. Switched to monthly Xgeva injectionson 04/03/20. -Second-line CAPOX q2weeks with Xeloda 1000mg i66me AM and 500mg in42m PM 1 week on/1 week off on 05/06/20 and Oxaliplatin starting 05/07/20.   INTERVAL HISTORY:  KathleenTEMICA RIGHETTI for a follow up of treatment. She presents to the clinic alone. She did not tolerate her week on. She notes after her Oxaliplatin infravision that day she had indigestion and fatigue for 3 days. The fourth day she vomited. Malox helped, but did not control it. She notes today she feels better but her body feels sore from vomiting twice and heaving. She also took antiemetics which helped. She denies fever or chills or BM or urination. She notes she was able to take  care of herself at home. She notes today is her last day on Xeloda this week. She drinks 24 ounces of water a day along with a protein shake a day. She notes she is not currently on DM medication but her A1c is 6.6. She notes during the same visit she was seen to have protein in her urine.    REVIEW OF SYSTEMS:   Constitutional: Denies fevers, chills or abnormal weight loss (+) Fatigue  Eyes: Denies blurriness of vision Ears, nose, mouth, throat, and face: Denies mucositis or sore throat Respiratory: Denies cough, dyspnea or wheezes Cardiovascular: Denies palpitation, chest discomfort or lower extremity swelling Gastrointestinal:  Denies heartburn or change in bowel  (+) N&V (+) Indigestion.  Skin: Denies abnormal skin rashes Lymphatics: Denies new lymphadenopathy or easy bruising Neurological:Denies numbness, tingling or new weaknesses Behavioral/Psych: Mood is stable, no new changes  All other systems were reviewed with the patient and are negative.  MEDICAL HISTORY:  Past Medical History:  Diagnosis Date   Allergic rhinitis    Arthritis    Asthma    xolair s 8/05 ?11/07; mastered hfa 12/20/08   Benign positional vertigo    CAD (coronary artery disease)    a. 09/2014 NSTEMI s/p LHC with sig 2V dz. dLAD diffusely diseased and not suitable for PCI. unsuccessful RCA PCI d/t heavy calcifications   gallbladder ca dx'd 10/2018   GERD (gastroesophageal reflux disease)    Heart attack ([place])    09/2014   Hyperlipidemia    <130 ldl pos fm hx, bp   Hypertension    Osteopenia    dexa 08/22/07 AP spine + 1.1, left femur -1.3, right femur -.8; dexa 10/06/09 +1.6, left femur =1.6, right femur -.   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 breast bx--for microcalcifications in rt.  retroareolar region--dx was hyalinized fibroadenoma   BUNIONECTOMY Bilateral    CARDIAC CATHETERIZATION     2015   CATARACT EXTRACTION Right 02/02/2018   Dr Satira Sark   CATARACT EXTRACTION Left 03/30/2018   CERVICAL [place] SURGERY  12/01   CORONARY ARTERY BYPASS GRAFT N/A 10/04/2018   Procedure: CORONARY ARTERY BYPASS GRAFTING (CABG) times 2 using left  Internal mammary artery to LAD, left greater saphenous vein - open harvest.;  Surgeon: [name], MD;  Location: [facility];  Service: Open Heart Surgery;  Laterality: N/A;   IR IMAGING GUIDED PORT INSERTION  12/08/2018   LEFT HEART CATH AND CORONARY ANGIOGRAPHY N/A 09/27/2018   Procedure: LEFT HEART CATH AND CORONARY ANGIOGRAPHY;  Surgeon: Troy Sine, MD;  Location: [facility];  Service: Cardiovascular;  Laterality: N/A;   LEFT HEART CATHETERIZATION WITH CORONARY ANGIOGRAM N/A 10/16/2014   Procedure: LEFT HEART CATHETERIZATION WITH CORONARY ANGIOGRAM;  Surgeon: Blane Ohara, MD;  Location: [facility] CATH LAB;  Service: Cardiovascular;  Laterality: N/A;   Left L4-5 transforaminal lumbar interbody fusion with Depuy cage, rods and screws, local and allograft bone graft, Vivigen; bilateral decompression/partial hemilaminectomy lumbar five-sacral one  07/2017   PERCUTANEOUS CORONARY ROTOBLATOR INTERVENTION (PCI-R) N/A 10/17/2014   Procedure: PERCUTANEOUS CORONARY ROTOBLATOR INTERVENTION (PCI-R);  Surgeon: Troy Sine, MD;  Location: [facility] CATH LAB;  Service: Cardiovascular;  Laterality: N/A;   TEE WITHOUT CARDIOVERSION N/A 10/04/2018   Procedure: TRANSESOPHAGEAL ECHOCARDIOGRAM (TEE);  Surgeon: [name], MD;  Location: [place];  Service: Open Heart Surgery;  Laterality: N/A;   VEIN LIGATION AND STRIPPING Left    VIDEO BRONCHOSCOPY Bilateral 02/05/2015   Procedure: VIDEO BRONCHOSCOPY WITHOUT FLUORO;  Surgeon: Tanda Rockers, MD;  Location: [facility];  Service: Endoscopy;  Laterality: Bilateral;   VULVA /PERINEUM  BIOPSY  12-30-10   --epidermoid cyst    I have reviewed the social history and family history with the patient and they are unchanged from previous note.  ALLERGIES:  is allergic to fish-derived products; penicillins; aspirin; brilinta [ticagrelor]; codeine phosphate; and diphenhydramine hcl.  MEDICATIONS:  Current Outpatient Medications  Medication Sig Dispense Refill   albuterol (PROAIR HFA) 108 (90 Base) MCG/ACT inhaler INHALE 1 TO 2 PUFFS BY MOUTH EVERY 4 HOURS AS NEEDED FOR WHEEZING 8.5 g 3   atorvastatin (LIPITOR) 40 MG tablet Take 1 tablet (40 mg total) by mouth daily. 90 tablet 3   bisoprolol (ZEBETA) 10 MG tablet TAKE 1 TABLET(10 MG) BY MOUTH DAILY 90 tablet 2   budesonide-formoterol (SYMBICORT) 160-4.5 MCG/ACT inhaler INHALE 2 PUFFS BY MOUTH EVERY 12 HOURS 30.6 g 3   capecitabine (XELODA) 500 MG tablet TAKE 2 TABLETS BY MOUTH EVERY 12 HOURS, 7 DAYS ON AND 7 DAYS OFF 56 tablet 0   cetirizine (ZYRTEC) 10 MG tablet Take 10 mg by mouth at bedtime as needed for allergies.     cholecalciferol 2000 units TABS Take 1 tablet (2,000 Units total) by mouth daily. 30 tablet 3   clopidogrel (PLAVIX) 75 MG tablet TAKE 1 TABLET(75 MG) BY MOUTH DAILY 90 tablet 3   dextromethorphan-guaiFENesin (MUCINEX DM) 30-600 MG per 12 hr tablet Take 1-2 tablets by mouth every 12 (twelve) hours as needed for cough (with flutter).      famotidine (PEPCID) 20 MG tablet Take 20 mg by mouth as needed.      ferrous sulfate 325 (65 FE) MG tablet Take 650 mg by mouth daily with breakfast.      fluticasone (FLONASE) 50 MCG/ACT nasal spray Place 1-2 sprays into both nostrils 2 (two) times daily as needed for allergies or rhinitis.     lidocaine-prilocaine (EMLA) cream APPLY EXTERNALLY TO THE AFFECTED AREA ONCE DAILY AS DIRECTED 30 g 3   magic mouthwash SOLN Take 5 mLs by mouth 4 (four) times daily. Leave Benadryl out of compound since pt allergic to it. Use hydrocortisone & nystatin. Swish & swallow or spit  for thrush 240 mL 1   magnesium oxide (MAGNESIUM-OXIDE) 400 (241.3 Mg) MG tablet Take 1 tablet (400 mg total) by mouth 3 (three) times daily. 90 tablet 2   meclizine (ANTIVERT) 25 MG tablet TAKE 2 TABLETS BY MOUTH THREE TIMES DAILY AS NEEDED 24 tablet 2   megestrol (MEGACE ES) 625 MG/5ML suspension Take 5 mLs (625 mg total) by mouth daily. 150 mL 0   montelukast (SINGULAIR) 10 MG tablet TAKE 1 TABLET(10 MG) BY MOUTH AT BEDTIME 90 tablet 1   Multiple Vitamin (MULTIVITAMIN) capsule Take 1 capsule by mouth daily.      ondansetron (ZOFRAN) 8 MG tablet Take 1 tablet (8 mg total) by mouth every 8 (eight) hours as needed for nausea or vomiting. 30 tablet 1   oxyCODONE (OXY IR/ROXICODONE) 5 MG immediate release tablet Take 1 tablet (5 mg total) by mouth every 8 (eight) hours as needed for severe pain. 45 tablet 0   sacubitril-valsartan (ENTRESTO) 24-26 MG Take 1 tablet by mouth 2 (two) times daily. 180 tablet 3  No current facility-administered medications for this visit.    PHYSICAL EXAMINATION: ECOG PERFORMANCE STATUS: 2 - Symptomatic, <50% confined to bed  Vitals:   05/12/20 0840  BP: 113/62  Pulse: 66  Resp: 18  Temp: 97.8 F (36.6 C)  SpO2: 100%   Filed Weights   05/12/20 0840  Weight: 116 lb 1 oz (52.6 kg)    Due to COVID19 we will limit examination to appearance. Patient had no complaints.  GENERAL:alert, no distress and comfortable SKIN: skin color normal, no rashes or significant lesions EYES: normal, Conjunctiva are pink and non-injected, sclera clear  NEURO: alert & oriented x 3 with fluent speech   LABORATORY DATA:  I have reviewed the data as listed CBC Latest Ref Rng & Units 05/07/2020 04/29/2020 04/03/2020  WBC 4.0 - 10.5 K/uL 5.3 4.9 4.4  Hemoglobin 12.0 - 15.0 g/dL 10.7(L) 11.4(L) 11.2(L)  Hematocrit 36.0 - 46.0 % 31.7(L) 34.1(L) 34.1(L)  Platelets 150 - 400 K/uL 265 321 324     CMP Latest Ref Rng & Units 05/09/2020 05/07/2020 04/29/2020  Glucose 70 - 99  mg/dL 103(H) 91 104(H)  BUN 6 - 23 mg/dL 17 15 18   Creatinine 0.40 - 1.20 mg/dL 1.11 1.12(H) 1.17(H)  Sodium 135 - 145 mEq/L 137 140 139  Potassium 3.5 - 5.1 mEq/L 3.9 4.2 4.4  Chloride 96 - 112 mEq/L 104 107 105  CO2 19 - 32 mEq/L 26 24 24   Calcium 8.4 - 10.5 mg/dL 10.4 9.7 9.7  Total Protein 6.5 - 8.1 g/dL - 7.8 8.3(H)  Total Bilirubin 0.3 - 1.2 mg/dL - 0.5 0.5  Alkaline Phos 38 - 126 U/L - 52 55  AST 15 - 41 U/L - 30 25  ALT 0 - 44 U/L - 16 12      RADIOGRAPHIC STUDIES: I have personally reviewed the radiological images as listed and agreed with the findings in the report. No results found.   ASSESSMENT & PLAN:  Cassie Campbell is a 79 y.o. 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  -She is also onZometa q23monthstarting 01/26/19. Switched to monthly Xgeva on 04/03/20 due to kidney function. -UnfortunatelyPET from 12/22/20showedsignificant progression of diffuse nodal metastasis, increased activity of gallbladder mass, and increased size and metabolic activity of left ischial lesion. Cisplatin and gemcitabine was discontinued.  -She began Xeloda 1500 mg BID 1 week on/1 week off on 1/21, she tolerated poorly and developed n/v, weakness, and low po intake.She was able to slowly titrate up from low dose 5059mBID to 100026mID by C4 on 02/26/20. -Unfortunately her PET from 04/30/20 showed increased size of gallbladder tumor and abdominal nodal metastasis. She does have new posterior mediastinal nodal metastasis. Overall scan showed disease progression.  -I previously discussed next line options of FOLFOX We discussed that there is no other  standard treatment options, although we can consider gem/abraxane,  FOLFIRI etc but these are probably less effective and clinically data is limited. I also discussed the targetable anti-HER2 treatment would further effect her current decreased heart function, although I may consider lapatinib, TD-M1 or ticatinib etc which causes less cardiac toxicities and Herceptin. -After lengthy discussion, patient is very reluctant to consider 5-FU pump infusion, she agrees to try CAPOX. Due to her advanced age and limited  social support, we proceeded with low-dose oxaliplatin at 95m/m2 starting 05/07/20 and Xeloda 10076mam and 50080mm, 1 week on and 1 week off starting on 05/06/20. -She did not tolerate her first week on treatment well with N&V, Fatigue and indigestion for about 5-6 days.  -I discussed reducing her Oxaliplatin dose to 14m52m for C2 cycle and see how she tolerates it. She is agreeable.  -She will complete her last dose Xeloda for this cycle today. F/u next week with C2. I told her not to start Xeloda until she returns.    2.Left upper leg pain -PET on 12/22 showed significant increase in size and metabolic activity of the lesion and extraosseous component.Ipreviouslydiscussed her pain is likely related to her cancer. -For her Pain she completedpalliative RT per Dr. MoodDanley Danker28/20 to 01/08/20 -Pain and weakness resolved after radiation, does not currently requireoxycodone anymoreand uses cane still. -Her pain has recurred recently but dose improve with exercise. Stable.   3. CAD, S/P CABGX2 on 09/27/2018, EF25-30% -Currently on Plavix.  -f/u with Dr. KellClaiborne Billingscent ECHO showed worsening EF  4. Asthma, dyspnea -Currently on Symbicort, Singulair, and albuterol as needed -f/u with pulmonary, stable, no recent flare -She has recently beenlittle more dyspneic. She can use dexa after chemo, and as needed for worsening wheezing  5.Goal of care discussion -The patient understands the goal of care is palliative. -she is full code for  now  6. Mild Anemia -Secondary to chemoand her recent open heart surgery -I discussed her anemia may contribute to her mild SOB. -She has not taken oral iron because it causes bloating. I encouraged her to take prenatal vitamin. -Previously normal, but mildly low lately. Stable   7. Weight loss, Low appetite  -Her weight continues to trend down on Xeloda  -She does have postprandial stomach aches, otherwise no other pain. If more persistent I suggest she use her Pepcid more regularly. -Ipreviouslyoffered her Dietician referral, she declined.  -I previously started her on low dose Megace (05/01/20).  -Due to recent N&V last week on CAPOX her weight is trending down more. I encouraged her to eat more and drink more.   8. Elevated Cr  -Cr has been elevated since being on Xeloda.  -Lasix has been held  -I encouraged her to drink enough water to manage her heart function and her Cr level. -Stable  -She notes at recent PCP visit she had A1c of 6.6 and mild protein in urine. She is not currently on DM medication. Will monitor.    Plan -Lab, flush, f/u and Oxaliplatin next week -she will complete this cycle Xeloda today,  C2 starts on 6/3, I will refill today (she still has 9 tabs at home, will refill 12 tabs only for this time)   No problem-specific Assessment & Plan notes found for this encounter.   No orders of the defined types were placed in this encounter.  All questions were answered. The patient knows to call the clinic with any problems, questions or concerns. No barriers to learning was detected. The total time spent in the appointment was 25 minutes.      Antha Niday Truitt Merle 05/12/2020   I, AmoyJoslyn Devon acting as scribe for Janice Seales Truitt Merle.   I have reviewed the above documentation for accuracy and completeness, and I agree with the above.

## 2020-05-09 NOTE — Progress Notes (Signed)
@Patient  ID: Cassie Campbell, female    DOB: [birth year], 79 y.o.   MRN: [id number]  Chief Complaint  Patient presents with  . 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 Melvyn Novas with medical history including hypertension, hyperlipidemia, coronary artery disease. 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 Burr Medico -undergoing active therapy initially treated with cisplatin and gemcitabine December 2019 to December 2020 stopped due to disease progression.  On monthly Zometa. Xeloda January 21/21 through Apr 28, 2020 On CAPOX w/ Xeloda and Delton See currently  CAD s/p CABG 09/2018   TEST/EVENTS :  2D echo January 2021 EF 25 to [id number], grade 3 diastolic dysfunction  0000000 Follow up : Asthma , HTN , Hyperlipidemia  Patient presents for a 109-month follow-up.  Patient says she continues to be weak with low energy as she is undergoing treatment for metastatic gallbladder cancer.  She says she recently had screening lab work through [employer] and was advised to follow-up with her primary care provider because her screening hemoglobin A1c was 6.6.  Also urine showed some positive protein.  Patient denies polydipsia polyuria.  Showed a blood sugar at 103.  Creatinine at 1.11 and GFR at 57. We discussed her lab work in detail.  Discussed a low sweet diet.  Patient has underlying  asthma remains on Symbicort twice daily.  And Singulair.  Denies any flare of cough or wheezing.  Says that she does get short of breath with activities.  She denies any hemoptysis or calf pain.  Has had low appetite and slow weight loss since beginning chemo.  She is currently on Megace.   Patient continues follow-up with oncology due to stage IV metastatic gallbladder cancer.  She has recently changed chemotherapy due to disease progression..  Allergies  Allergen Reactions  . Fish-Derived Products Shortness Of Breath, Swelling and Rash  . Penicillins Shortness Of Breath, Swelling and Rash    PATIENT HAS HAD A PCN REACTION WITH IMMEDIATE RASH, FACIAL/TONGUE/THROAT SWELLING, SOB, OR LIGHTHEADEDNESS WITH HYPOTENSION:  #  #  #  YES  #  #  #   Has patient had a PCN reaction causing severe rash involving mucus membranes or skin necrosis: No PATIENT HAS HAD A PCN REACTION THAT REQUIRED HOSPITALIZATION:  #  #  #  YES  #  #  #   Has patient had a PCN reaction occurring within the last 10 years: No    . Aspirin Swelling and Rash    SWELLING REACTION UNSPECIFIED   . Brilinta [Ticagrelor] Rash    Started Brilinta 09/2014 - rash - unsure which it is directly related to  . Codeine Phosphate Nausea Only  . Diphenhydramine Hcl Rash    Immunization History  Administered Date(s) Administered  . Fluad Quad(high Dose 65+) 08/13/2019  . Influenza Split 11/09/2011, 11/13/2012, 09/19/2014  . Influenza Whole 10/04/2007, 10/27/2009, 10/20/2010  . Influenza, High Dose Seasonal PF 10/06/2016, 10/04/2017, 10/09/2018  . Influenza,inj,Quad PF,6+ Mos 11/19/2013,  09/19/2014, 08/26/2015  . PFIZER SARS-COV-2 Vaccination 01/26/2020  . Pneumococcal Conjugate-13 05/07/2014  . Pneumococcal Polysaccharide-23 09/20/2003, 10/27/2009  . Tdap 09/19/2005, 05/20/2015    Past Medical History:  Diagnosis Date  . Allergic rhinitis   . Arthritis   . Asthma    xolair s 8/05 ?11/07; mastered hfa 12/20/08  . Benign  positional vertigo   . CAD (coronary artery disease)    a. 09/2014 NSTEMI s/p LHC with sig 2V dz. dLAD diffusely diseased and not suitable for PCI. unsuccessful RCA PCI d/t heavy calcifications  . gallbladder ca dx'd 10/2018  . GERD (gastroesophageal reflux disease)   . Heart attack ([place])    09/2014  . Hyperlipidemia    <130 ldl pos fm hx, bp  . Hypertension   . Osteopenia    dexa 08/22/07 AP spine + 1.1, left femur -1.3, right femur -.8; dexa 10/06/09 +1.6, left femur =1.6, right femur -.  . PONV (postoperative nausea and vomiting)   . Ruptured disc, cervical   . 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 albuterol (PROAIR HFA) 108 (90 Base) MCG/ACT inhaler INHALE 1 TO 2 PUFFS BY MOUTH EVERY 4 HOURS AS NEEDED FOR WHEEZING 8.5 g 3  . atorvastatin (LIPITOR) 40 MG tablet Take 1 tablet (40 mg total) by mouth daily. 90 tablet 3  . bisoprolol (ZEBETA) 10 MG tablet TAKE 1 TABLET(10 MG) BY MOUTH DAILY 90 tablet 2  . budesonide-formoterol (SYMBICORT) 160-4.5 MCG/ACT inhaler INHALE 2 PUFFS BY MOUTH EVERY 12 HOURS 30.6 g 3  . capecitabine (XELODA) 500 MG tablet TAKE 2 TABLETS BY MOUTH EVERY 12 HOURS, 7 DAYS ON AND 7 DAYS OFF 56 tablet 0  . cetirizine (ZYRTEC) 10 MG tablet Take 10 mg by mouth at bedtime as needed for allergies.    . cholecalciferol 2000 units TABS Take 1 tablet (2,000 Units total) by mouth daily. 30 tablet 3  . clopidogrel (PLAVIX) 75 MG tablet TAKE 1 TABLET(75 MG) BY MOUTH DAILY 90 tablet 3  . dextromethorphan-guaiFENesin (MUCINEX DM) 30-600 MG per 12 hr tablet Take 1-2 tablets by mouth every 12 (twelve) hours as needed for cough (with flutter).     . famotidine (PEPCID) 20 MG tablet Take 20 mg by mouth as needed.     . ferrous sulfate  325 (65 FE) MG tablet Take 650 mg by mouth daily with breakfast.     . fluticasone (FLONASE) 50 MCG/ACT nasal spray Place 1-2 sprays into both nostrils 2 (two) times daily as needed for allergies or rhinitis.    Marland Kitchen lidocaine-prilocaine (EMLA) cream APPLY EXTERNALLY TO THE AFFECTED AREA ONCE DAILY AS DIRECTED 30 g 3  . magic mouthwash SOLN Take 5 mLs by mouth 4 (four) times daily. Leave Benadryl out of compound since pt allergic to it. Use hydrocortisone & nystatin. Swish & swallow or spit for thrush 240 mL 1  . magnesium oxide (MAGNESIUM-OXIDE) 400 (241.3 Mg) MG tablet Take 1 tablet (400 mg total) by mouth 3 (three) times daily. 90 tablet 2  . meclizine (ANTIVERT) 25 MG tablet TAKE 2 TABLETS BY MOUTH THREE TIMES DAILY AS NEEDED 24 tablet 2  . megestrol (MEGACE ES) 625 MG/5ML suspension Take 5 mLs (625 mg total) by mouth daily. 150 mL 0  .  montelukast (SINGULAIR) 10 MG tablet TAKE 1 TABLET(10 MG) BY MOUTH AT BEDTIME 90 tablet 1  . Multiple Vitamin (MULTIVITAMIN) capsule Take 1 capsule by mouth daily.     . ondansetron (ZOFRAN) 8 MG tablet Take 1 tablet (8 mg total) by mouth every 8 (eight) hours as needed for nausea or vomiting. 30 tablet 1  . oxyCODONE (OXY IR/ROXICODONE) 5 MG immediate release tablet Take 1 tablet (5 mg total) by mouth every 8 (eight) hours as needed for severe pain. 45 tablet 0  . sacubitril-valsartan (ENTRESTO) 24-26 MG Take 1 tablet by mouth 2 (two) times daily. 180 tablet 3   No facility-administered medications prior to visit.     Review of Systems:   Constitutional:   No  weight loss, night sweats,  Fevers, chills,  +fatigue, or  lassitude.  HEENT:   No headaches,  Difficulty swallowing,  Tooth/dental problems, or  Sore throat,                No sneezing, itching, ear ache, nasal congestion, post nasal drip,   CV:  No chest pain,  Orthopnea, PND, swelling in lower extremities, anasarca, dizziness, palpitations, syncope.   GI  No heartburn, indigestion, abdominal  pain, nausea, vomiting, diarrhea, change in bowel habits, loss of appetite, bloody stools.   Resp:  No excess mucus, no productive cough,  No non-productive cough,  No coughing up of blood.  No change in color of mucus.  No wheezing.  No chest wall deformity  Skin: no rash or lesions.  GU: no dysuria, change in color of urine, no urgency or frequency.  No flank pain, no hematuria   MS:  No joint pain or swelling.  No decreased range of motion.  No back pain.    Physical Exam  BP 110/62 (BP Location: Left Arm, Cuff Size: Normal)   Pulse 73   Ht 4\' 11"  (1.499 m)   Wt 119 lb (54 kg)   LMP 12/21/1999   SpO2 93%   BMI 24.04 kg/m   GEN: A/Ox3; pleasant , NAD, thin elderly female  HEENT:  [place]/AT, , NOSE-clear, THROAT-clear, no lesions, no postnasal drip or exudate noted.   NECK:  Supple w/ fair ROM; no JVD; normal carotid impulses w/o bruits; no thyromegaly or nodules palpated; no lymphadenopathy.    RESP  Clear  P & A; w/o, wheezes/ rales/ or rhonchi. no accessory muscle use, no dullness to percussion  CARD:  RRR, no m/r/g, no peripheral edema, pulses intact, no cyanosis or clubbing.  GI:   Soft & nt; nml bowel sounds; no organomegaly or masses detected.   Musco: Warm bil, no deformities or joint swelling noted.   Neuro: alert, no focal deficits noted.    Skin: Warm, no lesions or rashes    Lab Results:  CBC    Component Value Date/Time   WBC 5.3 05/07/2020 0756   WBC 9.6 12/08/2018 1311   RBC 3.15 (L) 05/07/2020 0756   HGB 10.7 (L) 05/07/2020 0756   HGB 10.1 (L) 12/07/2018 0859   HCT 31.7 (L) 05/07/2020 0756   HCT 30.7 (L) 12/07/2018 0859   PLT 265 05/07/2020 0756   PLT 607 (H) 12/07/2018 0859   MCV 100.6 (H) 05/07/2020 0756   MCV 82 12/07/2018 0859   MCH 34.0 05/07/2020 0756   MCHC 33.8 05/07/2020 0756   RDW 19.8 (H) 05/07/2020 0756   RDW 14.5 12/07/2018 0859   LYMPHSABS 0.6 (L) 05/07/2020 0756   MONOABS 0.5 05/07/2020 0756  EOSABS 0.2 05/07/2020 0756    BASOSABS 0.0 05/07/2020 0756    BMET    Component Value Date/Time   NA 137 05/09/2020 0824   NA 139 02/26/2020 0906   K 3.9 05/09/2020 0824   CL 104 05/09/2020 0824   CO2 26 05/09/2020 0824   GLUCOSE 103 (H) 05/09/2020 0824   GLUCOSE 109 (H) 10/05/2006 1022   BUN 17 05/09/2020 0824   BUN 11 02/26/2020 0906   CREATININE 1.11 05/09/2020 0824   CREATININE 1.12 (H) 05/07/2020 0756   CREATININE 0.71 10/04/2016 1029   CALCIUM 10.4 05/09/2020 0824   GFRNONAA 47 (L) 05/07/2020 0756   GFRAA 55 (L) 05/07/2020 0756    BNP    Component Value Date/Time   BNP 423.6 (H) 02/05/2019 0941    ProBNP    Component Value Date/Time   PROBNP 951 (H) 02/26/2020 0906   PROBNP 1,313.0 (H) 11/27/2018 0957    Imaging: NM PET Image Restag (PS) Skull Base To Thigh  Result Date: 04/30/2020 CLINICAL DATA:  Subsequent treatment strategy for gallbladder carcinoma. EXAM: NUCLEAR MEDICINE PET SKULL BASE TO THIGH TECHNIQUE: 5.79 mCi F-18 FDG was injected intravenously. Full-ring PET imaging was performed from the skull base to thigh after the radiotracer. CT data was obtained and used for attenuation correction and anatomic localization. Fasting blood glucose: 93 mg/dl COMPARISON:  12/11/2019 FINDINGS: Mediastinal blood pool activity: SUV max 2.2 Liver activity: SUV max NA NECK: No hypermetabolic lymph nodes in the neck. Incidental CT findings: none CHEST: 7 mm left supraclavicular lymph node has an SUV max of 4.6, image 39/4. This is compared with 1 cm with SUV max of 5.0. Subcentimeter lymph node posterior to the left mainstem bronchus measures approximately 7 mm with SUV max of 4.4, image 64/4. Previously 6 mm within SUV max of 4.6. Paravertebral lymph node posterior to the descending aorta measures 1 cm with SUV max of 9.3, image 86/4. This is new compared with the previous exam. No FDG avid pulmonary nodules. Incidental CT findings: Cardiac enlargement and aortic atherosclerosis. Previous median sternotomy and  CABG procedure. ABDOMEN/PELVIS: Gallbladder mass measures 3.8 x 4.0 cm with SUV max of 14.3, image 109/4. Previously 2.6 x 2.8 cm within SUV max of 9.7. Extensive FDG avid abdominal lymph nodes are again noted. Nodal mass within the porta hepatic region has an SUV max of 11.8. Previously 12.05. Left retroperitoneal lymph node measures 2.1 cm within SUV max of 10.18, image 115/4. Previously 1.8 cm with SUV max of 12.2. Retrocaval lymph node measures 1.5 cm with SUV max of 13.5, image 111/4. Previously 1.3 cm with SUV max of 15.5. Left periaortic node just above the bifurcation measures 2.2 cm with SUV max of 12.9, image 125/4. Previously 1.7 cm within SUV max of 16.3. Right common iliac node measures 1.3 cm, image 131/4. Previously this measured 1.2 cm with SUV max of 15.3. Incidental CT findings: Aortic atherosclerosis without aneurysm. SKELETON: FDG avid sclerotic metastasis with considerable soft tissue component involving the left acetabulum is again noted. This measures 6.6 cm with SUV max of 19.96. Previously this measured 8.2 cm within SUV max of 24.98. no additional FDG avid skeletal metastases identified. Incidental CT findings: none IMPRESSION: 1. Increase in size of FDG avid gallbladder tumor. There is also been mild increase in size of FDG avid abdominal nodal metastases. Additionally, there is a new left-sided posterior mediastinal paravertebral FDG avid nodal metastases. 2. Similar appearance of small FDG avid left supraclavicular and subcarinal nodal metastases. Mild decrease  in size of large sclerotic metastasis with soft tissue component involving the left acetabulum. 3.  Aortic Atherosclerosis (ICD10-I70.0). Electronically Signed   By: Kerby Moors M.D.   On: 04/30/2020 15:34    denosumab (XGEVA) injection 120 mg    Date Action Dose Route User   04/03/2020 1014 Given 120 mg Subcutaneous (Left Arm) Murlean Hark H, LPN    denosumab (XGEVA) injection 120 mg    Date Action Dose Route User     05/01/2020 1008 Given 120 mg Subcutaneous (Left Arm) Raynelle Bring, LPN    dexamethasone (DECADRON) 10 mg in sodium chloride 0.9 % 50 mL IVPB    Date Action Dose Route User   05/07/2020 0857 Rate/Dose Change (none) (none) Flo Shanks, RN   05/07/2020 0857 New Bag/Given 10 mg Intravenous Flo Shanks, RN    dextrose 5 % solution    Date Action Dose Route User   05/07/2020 1207 Restarted (none) (none) Flo Shanks, RN   05/07/2020 [phone] Rate/Dose Verify (none) (none) Flo Shanks, RN   05/07/2020 [phone] Rate/Dose Verify (none) (none) Flo Shanks, RN   05/07/2020 [id number] Rate/Dose Verify (none) (none) Flo Shanks, RN   05/07/2020 [id number] Rate/Dose Change (none) (none) Flo Shanks, RN    heparin lock flush 100 unit/mL    Date Action Dose Route User   04/03/2020 0854 Given 500 Units Intracatheter Marvel Plan, Jasmin N, LPN    heparin lock flush 100 unit/mL    Date Action Dose Route User   04/29/2020 0929 Given 500 Units Intracatheter [place]ice[place]Mont[place], Lomira A, LPN    heparin lock flush 100 unit/mL    Date Action Dose Route User   05/07/2020 1207 Given 500 Units Intracatheter Flo Shanks, RN    oxaliplatin (ELOXATIN) 90 mg in dextrose 5 % 500 mL chemo infusion    Date Action Dose Route User   05/07/2020 1204 Rate/Dose Change (none) (none) Flo Shanks, RN   05/07/2020 1157 Rate/Dose Verify (none) (none) Flo Shanks, RN   05/07/2020 0959 Rate/Dose Change (none) (none) Flo Shanks, RN   05/07/2020 0959 Rate/Dose Verify (none) (none) Flo Shanks, RN   05/07/2020 0959 New Bag/Given 90 mg Intravenous Flo Shanks, RN    palonosetron Leona Carry) injection 0.25 mg    Date Action Dose Route User   05/07/2020 0916 Given 0.25 mg Intravenous Flo Shanks, RN    sodium chloride flush (NS) 0.9 % injection 10 mL    Date Action Dose Route User   04/03/2020 0854 Given 10 mL Intracatheter Marvel Plan, Jasmin N, LPN    sodium chloride flush  (NS) 0.9 % injection 10 mL    Date Action Dose Route User   04/29/2020 0929 Given 10 mL Intracatheter Licea[place]Montes [place], [place] A, LPN    sodium chloride flush (NS) 0.9 % injection 10 mL    Date Action Dose Route User   05/07/2020 1207 Given 10 mL Intracatheter Flo Shanks, RN      PFT Results Latest Ref Rng & Units 10/02/2018  FVC-Pre L 1.57  FVC-Predicted Pre % 96  Pre FEV1/FVC % % 60  FEV1-Pre L 0.95  FEV1-Predicted Pre % 76    Lab Results  Component Value Date   NITRICOXIDE 9 07/08/2016        Assessment & Plan:   Essential hypertension Currently under good control continue low-salt diet  CAD (coronary artery disease) Continue with follow-up with cardiology and current regimen  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  Continue on current regimen  Gallbladder cancer ([place]) Recent PET scan with disease progression. Continue on current follow-up with oncology  Diabetes [facility]) New onset diabetes with recent screening A1c through [employer].  Fasting blood sugars are 103 today.  A1c was 6.6.  We discussed diet therapy for now.  Patient is to decrease the sweets in her diet.  We will recheck an A1c in 3 months.  Hold on medications for now.  Also check a urine on return looking for proteinuria.  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     Rexene Edison, NP 05/09/2020

## 2020-05-09 NOTE — Assessment & Plan Note (Signed)
Recent PET scan with disease progression. Continue on current follow-up with oncology

## 2020-05-12 ENCOUNTER — Other Ambulatory Visit: Payer: Self-pay

## 2020-05-12 ENCOUNTER — Encounter: Payer: Self-pay | Admitting: Hematology

## 2020-05-12 ENCOUNTER — Telehealth: Payer: Self-pay | Admitting: Internal Medicine

## 2020-05-12 ENCOUNTER — Inpatient Hospital Stay (HOSPITAL_BASED_OUTPATIENT_CLINIC_OR_DEPARTMENT_OTHER): Payer: Medicare Other | Admitting: Hematology

## 2020-05-12 VITALS — BP 113/62 | HR 66 | Temp 97.8°F | Resp 18 | Ht 59.0 in | Wt 116.1 lb

## 2020-05-12 DIAGNOSIS — C23 Malignant neoplasm of gallbladder: Secondary | ICD-10-CM

## 2020-05-12 DIAGNOSIS — Z5111 Encounter for antineoplastic chemotherapy: Secondary | ICD-10-CM | POA: Diagnosis not present

## 2020-05-12 MED ORDER — CAPECITABINE 500 MG PO TABS: ORAL_TABLET | ORAL | 0 refills | Status: DC

## 2020-05-12 NOTE — Progress Notes (Signed)
LMTCB

## 2020-05-12 NOTE — Telephone Encounter (Signed)
Advised pt of results. Pt understood and nothing further is needed.      Tanda Rockers, MD  05/09/2020 3:31 PM EDT    Call patient : Studies are unremarkable - sugars can be controlled by diet, no need for rx at this point

## 2020-05-15 NOTE — Progress Notes (Signed)
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 Lymph node, biopsy, mediastinal - LYMPH NODE WITH METASTATIC ADENOCARCINOMA. - SEE MICROSCOPIC DESCRIPTION 2. Plaque, coronary artery - CALCIFIED ATHEROSCLEROTIC PLAQUE.   10/14/2018 Miscellaneous   Foundation One:  MSI stable tumor mutation burden 3Muts/mb ERBB2 amplification CCNE1 amplification TP53 mutation(+) CDK6 amplification  HGF amplification    11/13/2018 Cancer Staging   Staging form: Gallbladder, AJCC 8th Edition - Clinical stage from 11/13/2018: Stage IVB (cTX, cN2, pM1) - Signed by Truitt Merle, MD on 12/15/2018   11/17/2018 Imaging   11/17/2018 CT CAP IMPRESSION: 1. 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 This is associated with extensive upper abdominal and retroperitoneal lymphadenopathy, as well as metastatic lymphadenopathy in the posterior mediastinum and left supraclavicular region. There is also a metastatic lesion to the left ischium. 2. 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 3. Aortic atherosclerosis, in addition to left main and 3 vessel coronary artery disease. Status post median sternotomy for CABG including LIMA to the LAD. 4. There are calcifications of the aortic valve. Echocardiographic correlation for evaluation  of potential valvular dysfunction may be warranted if clinically indicated.    11/21/2018 Initial Diagnosis   Gallbladder cancer ([place])   11/27/2018 Pathology Results   11/27/2018 CA19-9 immunohistochemical stain Per request, a CA19-9 immunohistochemical stain was performed at an outside institution revealing positive staining in the tumor cells.    12/15/2018 - 11/30/2019 Chemotherapy   First line chemo cisplatin and gemcitabine every 2 weeks on 12/15/18. Had 1 month chemo break in April, restarted on 04/20/19. Stopped 11/30/19 due to disease progression.    01/26/2019 -  Chemotherapy   Zometa q45month starting 01/26/19-11/02/19. Switched to monthly Xgeva injections on 04/03/20   02/20/2019 PET scan   Restaging PET scan:  IMPRESSION: 1. Positive response to therapy at all primary and metastatic sites. Persistent primary and metastatic lesions do have intense hypermetabolic activity albeit reduced. 2. Decrease in size and hypermetabolic activity of mass lesion in the gallbladder fundus. 3. Interval decrease in size and metabolic activity of periportal adenopathy. 4. Decrease in size and metabolic activity of periaortic lymph nodes. 5. Interval decrease in size and metabolic activity of destructive lesion in the LEFT inferior pubic ramus with new sclerosis. 6. No new or progressive disease.   05/29/2019 Imaging   CT CAP W Contrast  IMPRESSION: Known gallbladder adenocarcinoma, difficult to compare to recent PET, improved from prior CT.  Upper abdominal/retroperitoneal lymphadenopathy, slightly improved from prior PET.  Osseous metastasis involving the left inferior pubic ramus, grossly unchanged.  No evidence of metastatic disease in the chest.  Additional ancillary findings as above.   08/22/2019 Imaging   CT CAP W Contrast  IMPRESSION: 1. Stable soft tissue mass involving the gallbladder fundus. 2. Stable mild porta hepatis and retroperitoneal lymphadenopathy. 3. Stable  sclerotic bone metastases. 4. No new or progressive metastatic disease identified.  5. Stable 2.3 cm left thyroid lobe nodule.   11/14/2019 Imaging   CT Left Hip WO  contrast  IMPRESSION: 1. 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 Associated soft tissue components have progressed, but there is no evidence of pathologic fracture. 2. No acute osseous findings. 3. 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   12/11/2019 PET scan   IMPRESSION: 1. Significant progression of malignancy, with increased activity in the gallbladder mass; considerable increase in size and activity of porta hepatis, retroperitoneal, and upper pelvic adenopathy; significant increase in size and metabolic activity of the left ischial metastatic lesion with extensive extraosseous component; and new mildly hypermetabolic left supraclavicular and thoracic periaortic lymph nodes suspicious for malignant involvement. 2. Faintly increased activity in a left thyroid nodule which was not previously hypermetabolic. Given the relatively low-grade activity, this may be amenable to surveillance, but a significant minority of thyroid nodules with hypermetabolic activity can represent thyroid cancer. 3. Other imaging findings of potential clinical significance: Chronic paranasal sinusitis. Chronic right middle lobe atelectasis. Aortic Atherosclerosis (ICD10-I70.0). Coronary atherosclerosis with cardiomegaly.   12/17/2019 - 01/07/2020 Radiation Therapy   Palliative Radiation with Dr. Lisbeth Renshaw 12/17/19-01/07/20   01/10/2020 - 04/28/2020 Chemotherapy   Xeloda 1500 mg BID 1 week on/1 week off starting on 01/10/20, she tolerated poorly and developed n/v, weakness, and low po intake. She was instructed to dose reduce to 500 mg BID which she continued into C2. Increased to 1076m Am and 5013mPM on 02/12/20 and increased to 100082mID starting 02/26/20 starting with C4. Last dose taken 04/28/20 for this regimen     04/30/2020 PET scan   IMPRESSION: 1. Increase in size of FDG avid gallbladder tumor. There is also been mild increase in size of FDG avid abdominal nodal metastases. Additionally, there is a new left-sided posterior mediastinal paravertebral FDG avid nodal metastases. 2. Similar appearance of small FDG avid left supraclavicular and subcarinal nodal metastases. Mild decrease in size of large sclerotic metastasis with soft tissue component involving the left acetabulum. 3.  Aortic Atherosclerosis (ICD10-I70.0).   05/06/2020 -  Chemotherapy   Second-line CAPOX q2weeks with Xeloda 1000m10m the AM and 500mg2mthe PM 1 week on/1 week off on 05/06/20 and Oxaliplatin starting 05/07/20. Due to poor toleration with n/v/d, we dose reduced Oxaliplatin to 40mg/69mtarting with C2 on 05/22/20.       CURRENT THERAPY:  -Zometa q3month69monthng 01/26/19-11/02/19. Switched to monthly Xgeva injectionson 04/03/20. -Second-line CAPOX q2weeks with Xeloda 1000mg in70m AM and 500mg in 58mPM 1 week on/1 week off on 05/06/20 and Oxaliplatin starting 05/07/20. Due to poor toleration with n/v/d, we dose reduced Oxaliplatin to 40mg/m2 s74ming with C2 on 05/22/20.   INTERVAL HISTORY:  Cassie JSHALYN KORALor a follow up and treatment. She presents to the clinic alone. She notes she recovered from last cycle on her week off. She feels she is back to normal. She is able to eat better and able to gain 3 pounds since last visit. She started her current cycle this morning. She notes her only pain is left hip aches. She manages this with Tylenol. She notes she had mild diarrhea yesterday which is resolved today so far.    REVIEW OF SYSTEMS:   Constitutional: Denies fevers, chills or abnormal weight loss Eyes: Denies blurriness of vision Ears, nose, mouth, throat, and face: Denies mucositis or sore throat Respiratory: Denies cough, dyspnea or wheezes Cardiovascular: Denies palpitation, chest discomfort or lower  extremity swelling Gastrointestinal:  Denies nausea, heartburn or change in bowel habits Skin: Denies abnormal skin rashes MSK: (+) Left hip aches  Lymphatics: Denies new lymphadenopathy or easy bruising Neurological:Denies numbness, tingling or new weaknesses Behavioral/Psych: Mood is stable, no new changes  All other systems were reviewed with the patient and are negative.  MEDICAL HISTORY:  Past Medical History:  Diagnosis Date  . Allergic rhinitis   . Arthritis   . Asthma    xolair s 8/05 ?11/07; mastered hfa 12/20/08  . Benign positional vertigo   . CAD (coronary artery disease)    a. 09/2014 NSTEMI s/p LHC with sig 2V dz. dLAD diffusely diseased and not suitable for PCI. unsuccessful RCA PCI d/t heavy calcifications  . gallbladder ca dx'd 10/2018  . GERD (gastroesophageal reflux disease)   . Heart attack ([place])    09/2014  . Hyperlipidemia    <130 ldl pos fm hx, bp  . Hypertension   . Osteopenia    dexa 08/22/07 AP spine + 1.1, left femur -1.3, right femur -.8; dexa 10/06/09 +1.6, left femur =1.6, right femur -.  . PONV (postoperative nausea and vomiting)   . Ruptured disc, cervical   . 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 BREAST SURGERY  10-26-10   Rt. breast bx--for microcalcifications in rt. retroareolar region--dx was hyalinized fibroadenoma  . BUNIONECTOMY Bilateral   . CARDIAC CATHETERIZATION     2015  . CATARACT EXTRACTION Right 02/02/2018   Dr Satira Sark  . CATARACT EXTRACTION Left 03/30/2018  . [place] SURGERY  12/01  . CORONARY ARTERY BYPASS GRAFT N/A 10/04/2018   Procedure: CORONARY ARTERY BYPASS GRAFTING (CABG) times 2 using left  Internal mammary artery to LAD, left greater saphenous vein - open harvest.;  Surgeon: [name], MD;  Location: [place];  Service: Open Heart Surgery;  Laterality: N/A;  . IR IMAGING GUIDED PORT INSERTION  12/08/2018    . LEFT HEART CATH AND CORONARY ANGIOGRAPHY N/A 09/27/2018   Procedure: LEFT HEART CATH AND CORONARY ANGIOGRAPHY;  Surgeon: Troy Sine, MD;  Location: [place] CV LAB;  Service: Cardiovascular;  Laterality: N/A;  . LEFT HEART CATHETERIZATION WITH CORONARY ANGIOGRAM N/A 10/16/2014   Procedure: LEFT HEART CATHETERIZATION WITH CORONARY ANGIOGRAM;  Surgeon: Blane Ohara, MD;  Location: [facility] CATH LAB;  Service: Cardiovascular;  Laterality: N/A;  . Left L4-5 transforaminal lumbar interbody fusion with Depuy cage, rods and screws, local and allograft bone graft, Vivigen; bilateral decompression/partial hemilaminectomy lumbar five-sacral one  07/2017  . PERCUTANEOUS CORONARY ROTOBLATOR INTERVENTION (PCI-R) N/A 10/17/2014   Procedure: PERCUTANEOUS CORONARY ROTOBLATOR INTERVENTION (PCI-R);  Surgeon: Troy Sine, MD;  Location: [facility] CATH [facility];  Service: Cardiovascular;  Laterality: N/A;  . TEE WITHOUT CARDIOVERSION N/A 10/04/2018   Procedure: TRANSESOPHAGEAL ECHOCARDIOGRAM (TEE);  Surgeon: [name], MD;  Location: [place];  Service: Open Heart Surgery;  Laterality: N/A;  . VEIN LIGATION AND STRIPPING Left   . VIDEO BRONCHOSCOPY Bilateral 02/05/2015   Procedure: VIDEO BRONCHOSCOPY WITHOUT FLUORO;  Surgeon: Tanda Rockers, MD;  Location: [facility] ENDOS[facility];  Service: Endoscopy;  Laterality: Bilateral;  . VULVA /PERINEUM BIOPSY  12-30-10   --epidermoid cyst    I have reviewed the social history and family history with the patient and they are unchanged from previous note.  ALLERGIES:  is allergic to fish-derived products; penicillins; aspirin; brilinta [ticagrelor]; codeine phosphate; and diphenhydramine hcl.  MEDICATIONS:  Current Outpatient  Medications  Medication Sig Dispense Refill  . albuterol (PROAIR HFA) 108 (90 Base) MCG/ACT inhaler INHALE 1 TO 2 PUFFS BY MOUTH EVERY 4 HOURS AS NEEDED FOR WHEEZING 8.5 g 3  . atorvastatin (LIPITOR) 40 MG tablet Take 1 tablet (40 mg total) by mouth  daily. 90 tablet 3  . bisoprolol (ZEBETA) 10 MG tablet TAKE 1 TABLET(10 MG) BY MOUTH DAILY 90 tablet 2  . budesonide-formoterol (SYMBICORT) 160-4.5 MCG/ACT inhaler INHALE 2 PUFFS BY MOUTH EVERY 12 HOURS 30.6 g 3  . capecitabine (XELODA) 500 MG tablet TAKE 2 TABLETS BY MOUTH EVERY 12 HOURS, 7 DAYS ON AND 7 DAYS OFF, pt stil has 9 tabs at home 12 tablet 0  . cetirizine (ZYRTEC) 10 MG tablet Take 10 mg by mouth at bedtime as needed for allergies.    . cholecalciferol 2000 units TABS Take 1 tablet (2,000 Units total) by mouth daily. 30 tablet 3  . clopidogrel (PLAVIX) 75 MG tablet TAKE 1 TABLET(75 MG) BY MOUTH DAILY 90 tablet 3  . dextromethorphan-guaiFENesin (MUCINEX DM) 30-600 MG per 12 hr tablet Take 1-2 tablets by mouth every 12 (twelve) hours as needed for cough (with flutter).     . famotidine (PEPCID) 20 MG tablet Take 20 mg by mouth as needed.     . ferrous sulfate 325 (65 FE) MG tablet Take 650 mg by mouth daily with breakfast.     . fluticasone (FLONASE) 50 MCG/ACT nasal spray Place 1-2 sprays into both nostrils 2 (two) times daily as needed for allergies or rhinitis.    Marland Kitchen lidocaine-prilocaine (EMLA) cream APPLY EXTERNALLY TO THE AFFECTED AREA ONCE DAILY AS DIRECTED 30 g 3  . magic mouthwash SOLN Take 5 mLs by mouth 4 (four) times daily. Leave Benadryl out of compound since pt allergic to it. Use hydrocortisone & nystatin. Swish & swallow or spit for thrush 240 mL 1  . magnesium oxide (MAGNESIUM-OXIDE) 400 (241.3 Mg) MG tablet Take 1 tablet (400 mg total) by mouth 3 (three) times daily. 90 tablet 2  . meclizine (ANTIVERT) 25 MG tablet TAKE 2 TABLETS BY MOUTH THREE TIMES DAILY AS NEEDED 24 tablet 2  . megestrol (MEGACE ES) 625 MG/5ML suspension Take 5 mLs (625 mg total) by mouth daily. 150 mL 0  . montelukast (SINGULAIR) 10 MG tablet TAKE 1 TABLET(10 MG) BY MOUTH AT BEDTIME 90 tablet 1  . Multiple Vitamin (MULTIVITAMIN) capsule Take 1 capsule by mouth daily.     . ondansetron (ZOFRAN) 8 MG  tablet Take 1 tablet (8 mg total) by mouth every 8 (eight) hours as needed for nausea or vomiting. 30 tablet 1  . oxyCODONE (OXY IR/ROXICODONE) 5 MG immediate release tablet Take 1 tablet (5 mg total) by mouth every 8 (eight) hours as needed for severe pain. 45 tablet 0  . sacubitril-valsartan (ENTRESTO) 24-26 MG Take 1 tablet by mouth 2 (two) times daily. 180 tablet 3   No current facility-administered medications for this visit.   Facility-Administered Medications Ordered in Other Visits  Medication Dose Route Frequency Provider Last Rate Last Admin  . heparin lock flush 100 unit/mL  500 Units Intracatheter Once PRN Truitt Merle, MD      . oxaliplatin (ELOXATIN) 60 mg in dextrose 5 % 500 mL chemo infusion  40 mg/m2 (Treatment Plan Recorded) Intravenous Once Truitt Merle, MD      . sodium chloride flush (NS) 0.9 % injection 10 mL  10 mL Intracatheter PRN Truitt Merle, MD  PHYSICAL EXAMINATION: ECOG PERFORMANCE STATUS: 1 - Symptomatic but completely ambulatory  Vitals:   05/22/20 0833  BP: 125/72  Pulse: 68  Resp: 16  Temp: 97.6 F (36.4 C)  SpO2: 100%   Filed Weights   05/22/20 0833  Weight: 119 lb (54 kg)    Due to COVID19 we will limit examination to appearance. Patient had no complaints.  GENERAL:alert, no distress and comfortable SKIN: skin color normal, no rashes or significant lesions EYES: normal, Conjunctiva are pink and non-injected, sclera clear  NEURO: alert & oriented x 3 with fluent speech   LABORATORY DATA:  I have reviewed the data as listed CBC Latest Ref Rng & Units 05/22/2020 05/07/2020 04/29/2020  WBC 4.0 - 10.5 K/uL 5.5 5.3 4.9  Hemoglobin 12.0 - 15.0 g/dL 10.3(L) 10.7(L) 11.4(L)  Hematocrit 36.0 - 46.0 % 30.9(L) 31.7(L) 34.1(L)  Platelets 150 - 400 K/uL 291 265 321     CMP Latest Ref Rng & Units 05/22/2020 05/09/2020 05/07/2020  Glucose 70 - 99 mg/dL 113(H) 103(H) 91  BUN 8 - 23 mg/dL _0 Creatinine 0.44 - 1.00 mg/dL 1.00 1.11 1.12(H)  Sodium 135 -  145 mmol/L 141 137 140  Potassium 3.5 - 5.1 mmol/L 3.5 3.9 4.2  Chloride 98 - 111 mmol/L 107 104 107  CO2 22 - 32 mmol/L _1 Calcium 8.9 - 10.3 mg/dL 9.5 10.4 9.7  Total Protein 6.5 - 8.1 g/dL 7.5 - 7.8  Total Bilirubin 0.3 - 1.2 mg/dL 0.7 - 0.5  Alkaline Phos 38 - 126 U/L 59 - 52  AST 15 - 41 U/L 26 - 30  ALT 0 - 44 U/L 14 - 16      RADIOGRAPHIC STUDIES: I have personally reviewed the radiological images as listed and agreed with the findings in the report. No results found.   ASSESSMENT & PLAN:  JESS SULAK is a 79 y.o. 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  -She is also onZometa q68monthstarting 01/26/19. Switched to monthly Xgeva on 04/03/20 due to kidney function. -UnfortunatelyPET from 12/22/20showedsignificant progression of diffuse nodal metastasis, increased activity of gallbladder mass, and increased size and metabolic activity of left ischial lesion. Cisplatin and gemcitabine was discontinued.  -She began Xeloda 1500 mg BID 1 week on/1 week off on 1/21, she tolerated poorly and developed n/v, weakness, and low po intake.She was able to slowly titrate up from low dose 506mBID to 100086mID by C4 on 02/26/20. -Unfortunately her PET from 5/12/21showedincreased size of gallbladder tumor and abdominal nodal metastasis. She does have newposterior mediastinalnodal metastasis. Overall scan showed disease progression.  -I previously discussednext lineoptions of FOLFOXWe discussed that there is no otherstandard treatment options, although we can consider gem/abraxane, FOLFIRI etc but these are probablyless effectiveand clinically data is limited. I also discussed the targetable  anti-HER2 treatment would further effect her current decreased heart function,although I may consider lapatinib, TD-M1 or ticatinib etc which causes less cardiactoxicities and Herceptin. -Pt started CAPOX every 2 weeks.Due to her advanced age and limited social support, we proceeded with low-dose Oxaliplatin at 92m76m starting 05/07/20 and Xeloda1000mg24mand 500mg 53m week on and 1 week off starting on 05/06/20. Due to poor toleration with N&V, Fatigue and indigestion, C2 Oxaliplatin will  be reduced to 79m/m2 starting 05/22/20.  -she has recovered well form cycle 1. Labs reviewed and overall adequate to proceed with C2 Oxaliplatin with dose reduction and Day 1 Xeloda at same dose.  -F/u in 2 and 4 weeks.    2.Left hip pain -PET on 12/22 showed significant increase in size and metabolic activity of the lesion and extraosseous component.Ipreviouslydiscussed her pain is likely related to her cancer. -For her Pain she completedpalliative RT per Dr. MDanley Danker12/28/20 to 01/08/20 -Pain and weakness resolved after radiation, does not currently requireoxycodone anymoreand uses cane still. -Her pain has recurred recently but dose improve with exercise. Stable and manageable on Tylenol.   3. CAD, S/P CABGX2 on 09/27/2018, EF25-30% -Currently on Plavix.  -f/u with Dr. KClaiborne Billings-recent ECHO showed worsening EF  4. Asthma, dyspnea -Currently on Symbicort, Singulair, and albuterol as needed -f/u with pulmonary, stable, no recent flare -She has recently beenlittle more dyspneic. She can use dexa after chemo, and as needed for worsening wheezing  5.Goal of care discussion -The patient understands the goal of care is palliative. -she is full code for now  6. Mild Anemia -Secondary to chemoand her recent open heart surgery -I discussed her anemia may contribute to her mild SOB. -She has not taken oral iron because it causes bloating. I encouraged her to take prenatal  vitamin. -Previously normal, but mildly low lately.Stable  7. Weight loss, Low appetite -Her weight continues to trend down on Xeloda  -She does have postprandial stomach aches, otherwise no other pain. If more persistent I suggest she use her Pepcid more regularly. -Ipreviouslyoffered her Dietician referral, she declined.  -I previously started her on low dose Megace (05/01/20). -Due to recent N&V last week on CAPOX her weight is trending down more. I encouraged her to eat more and drink more.  -She has been able to recover on her week off chemo and gain weight back.   8. Elevated Cr  -Cr has been elevated since being on Xeloda.  -Lasix has been held  -I encouraged her to drink enough water to manage her heart function and her Cr level. -Stable -She notes at recent PCP visit she had A1c of 6.6 and mild protein in urine. She is not currently on DM medication. Will monitor.    Plan -Labs reviewed and adequate to proceed with C2 Oxaliplatin at reduced dose 442mm2 -Continue with C2 Xeloda today, 100082mn the AM and 500m47m the PM, 1 week on/1 week off -Lab, flush, f/u and Oxaliplatin in 2 and 4 weeks    No problem-specific Assessment & Plan notes found for this encounter.   No orders of the defined types were placed in this encounter.  All questions were answered. The patient knows to call the clinic with any problems, questions or concerns. No barriers to learning was detected. The total time spent in the appointment was 30 minutes.     Kinney Sackmann Truitt Merle 05/22/2020   I, AmoyJoslyn Devon acting as scribe for Amani Marseille Truitt Merle.   I have reviewed the above documentation for accuracy and completeness, and I agree with the above.

## 2020-05-16 ENCOUNTER — Telehealth: Payer: Self-pay

## 2020-05-16 NOTE — Telephone Encounter (Signed)
-----   Message from Truitt Merle, MD sent at 05/16/2020  7:05 AM EDT ----- Please let pt know her Mag was slightly low early this week, and increase mag supplement, thanks   Truitt Merle  05/16/2020

## 2020-05-16 NOTE — Telephone Encounter (Signed)
I spoke to Ms Kackley and relayed Dr. Ernestina Penna message.  She states she is taking 3 magnesium tablets per day.  I asked her to increase to 4 per day.  She also states her stomach feels sour and she had to take maalox that helped.  I instructed her to increase her pepcid to twice a day.  She verbalized understanding.

## 2020-05-20 MED FILL — CAPECITABINE 500 MG TABLET: 500 | 7 days supply | Qty: 12 | Fill #0

## 2020-05-22 ENCOUNTER — Other Ambulatory Visit: Payer: Self-pay

## 2020-05-22 ENCOUNTER — Inpatient Hospital Stay: Payer: Medicare Other

## 2020-05-22 ENCOUNTER — Encounter: Payer: Self-pay | Admitting: Hematology

## 2020-05-22 ENCOUNTER — Inpatient Hospital Stay: Payer: Medicare Other | Attending: Obstetrics | Admitting: Hematology

## 2020-05-22 VITALS — BP 125/72 | HR 68 | Temp 97.6°F | Resp 16 | Wt 119.0 lb

## 2020-05-22 DIAGNOSIS — C23 Malignant neoplasm of gallbladder: Secondary | ICD-10-CM

## 2020-05-22 DIAGNOSIS — J45909 Unspecified asthma, uncomplicated: Secondary | ICD-10-CM | POA: Diagnosis not present

## 2020-05-22 DIAGNOSIS — C7951 Secondary malignant neoplasm of bone: Secondary | ICD-10-CM | POA: Insufficient documentation

## 2020-05-22 DIAGNOSIS — Z923 Personal history of irradiation: Secondary | ICD-10-CM | POA: Diagnosis not present

## 2020-05-22 DIAGNOSIS — D6481 Anemia due to antineoplastic chemotherapy: Secondary | ICD-10-CM | POA: Diagnosis not present

## 2020-05-22 DIAGNOSIS — Z9221 Personal history of antineoplastic chemotherapy: Secondary | ICD-10-CM | POA: Insufficient documentation

## 2020-05-22 DIAGNOSIS — Z7902 Long term (current) use of antithrombotics/antiplatelets: Secondary | ICD-10-CM | POA: Insufficient documentation

## 2020-05-22 DIAGNOSIS — R944 Abnormal results of kidney function studies: Secondary | ICD-10-CM | POA: Insufficient documentation

## 2020-05-22 DIAGNOSIS — Z5111 Encounter for antineoplastic chemotherapy: Secondary | ICD-10-CM | POA: Insufficient documentation

## 2020-05-22 DIAGNOSIS — M25552 Pain in left hip: Secondary | ICD-10-CM | POA: Diagnosis not present

## 2020-05-22 DIAGNOSIS — I2510 Atherosclerotic heart disease of native coronary artery without angina pectoris: Secondary | ICD-10-CM | POA: Insufficient documentation

## 2020-05-22 DIAGNOSIS — Z7189 Other specified counseling: Secondary | ICD-10-CM

## 2020-05-22 DIAGNOSIS — C778 Secondary and unspecified malignant neoplasm of lymph nodes of multiple regions: Secondary | ICD-10-CM | POA: Diagnosis not present

## 2020-05-22 LAB — CBC WITH DIFFERENTIAL (CANCER CENTER ONLY)
Abs Immature Granulocytes: 0.02 10*3/uL (ref 0.00–0.07)
Basophils Absolute: 0 10*3/uL (ref 0.0–0.1)
Basophils Relative: 1 %
Eosinophils Absolute: 0.3 10*3/uL (ref 0.0–0.5)
Eosinophils Relative: 5 %
HCT: 30.9 % — ABNORMAL LOW (ref 36.0–46.0)
Hemoglobin: 10.3 g/dL — ABNORMAL LOW (ref 12.0–15.0)
Immature Granulocytes: 0 %
Lymphocytes Relative: 10 %
Lymphs Abs: 0.6 10*3/uL — ABNORMAL LOW (ref 0.7–4.0)
MCH: 34.4 pg — ABNORMAL HIGH (ref 26.0–34.0)
MCHC: 33.3 g/dL (ref 30.0–36.0)
MCV: 103.3 fL — ABNORMAL HIGH (ref 80.0–100.0)
Monocytes Absolute: 0.4 10*3/uL (ref 0.1–1.0)
Monocytes Relative: 8 %
Neutro Abs: 4.2 10*3/uL (ref 1.7–7.7)
Neutrophils Relative %: 76 %
Platelet Count: 291 10*3/uL (ref 150–400)
RBC: 2.99 MIL/uL — ABNORMAL LOW (ref 3.87–5.11)
RDW: 18.7 % — ABNORMAL HIGH (ref 11.5–15.5)
WBC Count: 5.5 10*3/uL (ref 4.0–10.5)
nRBC: 0 % (ref 0.0–0.2)

## 2020-05-22 LAB — CMP (CANCER CENTER ONLY)
ALT: 14 U/L (ref 0–44)
AST: 26 U/L (ref 15–41)
Albumin: 3.2 g/dL — ABNORMAL LOW (ref 3.5–5.0)
Alkaline Phosphatase: 59 U/L (ref 38–126)
Anion gap: 10 (ref 5–15)
BUN: 10 mg/dL (ref 8–23)
CO2: 24 mmol/L (ref 22–32)
Calcium: 9.5 mg/dL (ref 8.9–10.3)
Chloride: 107 mmol/L (ref 98–111)
Creatinine: 1 mg/dL (ref 0.44–1.00)
GFR, Est AFR Am: >60 mL/min (ref >60)
GFR, Estimated: 54 mL/min — ABNORMAL LOW (ref >60)
Glucose, Bld: 113 mg/dL — ABNORMAL HIGH (ref 70–99)
Potassium: 3.5 mmol/L (ref 3.5–5.1)
Sodium: 141 mmol/L (ref 135–145)
Total Bilirubin: 0.7 mg/dL (ref 0.3–1.2)
Total Protein: 7.5 g/dL (ref 6.5–8.1)

## 2020-05-22 LAB — MAGNESIUM: Magnesium: 1.7 mg/dL (ref 1.7–2.4)

## 2020-05-22 MED ORDER — SODIUM CHLORIDE 0.9% FLUSH
10.0000 mL | Freq: Once | INTRAVENOUS | Status: AC | PRN
  Administered 2020-05-22: 10 mL
  Filled 2020-05-22: qty 10

## 2020-05-22 MED ORDER — PALONOSETRON HCL INJECTION 0.25 MG/5ML
INTRAVENOUS | Status: AC
  Filled 2020-05-22: qty 5

## 2020-05-22 MED ORDER — OXALIPLATIN CHEMO INJECTION 100 MG/20ML
40.0000 mg/m2 | Freq: Once | INTRAVENOUS | Status: AC
  Administered 2020-05-22: 60 mg via INTRAVENOUS
  Filled 2020-05-22: qty 12

## 2020-05-22 MED ORDER — SODIUM CHLORIDE 0.9 % IV SOLN
10.0000 mg | Freq: Once | INTRAVENOUS | Status: AC
  Administered 2020-05-22: 10 mg via INTRAVENOUS
  Filled 2020-05-22: qty 10

## 2020-05-22 MED ORDER — DEXTROSE 5 % IV SOLN
Freq: Once | INTRAVENOUS | Status: AC
  Filled 2020-05-22: qty 250

## 2020-05-22 MED ORDER — SODIUM CHLORIDE 0.9% FLUSH
10.0000 mL | INTRAVENOUS | Status: DC | PRN
  Administered 2020-05-22: 10 mL
  Filled 2020-05-22: qty 10

## 2020-05-22 MED ORDER — PALONOSETRON HCL INJECTION 0.25 MG/5ML
0.2500 mg | Freq: Once | INTRAVENOUS | Status: AC
  Administered 2020-05-22: 0.25 mg via INTRAVENOUS

## 2020-05-22 MED ORDER — HEPARIN SOD (PORK) LOCK FLUSH 100 UNIT/ML IV SOLN
500.0000 [IU] | Freq: Once | INTRAVENOUS | Status: AC | PRN
  Administered 2020-05-22: 500 [IU]
  Filled 2020-05-22: qty 5

## 2020-05-22 NOTE — Patient Instructions (Signed)
[facility] Discharge Instructions for Patients Receiving Chemotherapy  Today you received the following chemotherapy agents: Oxaliplatin  To help prevent nausea and vomiting after your treatment, we encourage you to take your nausea medication as directed.   If you develop nausea and vomiting that is not controlled by your nausea medication, call the clinic.   BELOW ARE SYMPTOMS THAT SHOULD BE REPORTED IMMEDIATELY:  *FEVER GREATER THAN 100.5 F  *CHILLS WITH OR WITHOUT FEVER  NAUSEA AND VOMITING THAT IS NOT CONTROLLED WITH YOUR NAUSEA MEDICATION  *UNUSUAL SHORTNESS OF BREATH  *UNUSUAL BRUISING OR BLEEDING  TENDERNESS IN MOUTH AND THROAT WITH OR WITHOUT PRESENCE OF ULCERS  *URINARY PROBLEMS  *BOWEL PROBLEMS  UNUSUAL RASH Items with * indicate a potential emergency and should be followed up as soon as possible.  Feel free to call the clinic should you have any questions or concerns. The clinic phone number is [phone].  Please show the CHEMO ALERT CARD at check-in to the Emergency Department and triage nurse.   

## 2020-05-23 ENCOUNTER — Telehealth: Payer: Self-pay | Admitting: Hematology

## 2020-05-23 LAB — CANCER ANTIGEN 19-9: CA 19-9: 2204 U/mL — ABNORMAL HIGH (ref 0–35)

## 2020-05-23 NOTE — Telephone Encounter (Signed)
Scheduled appt per 6/3 los.  Spoke with pt and they are aware of the appt date and time.   

## 2020-05-27 ENCOUNTER — Other Ambulatory Visit: Payer: Self-pay | Admitting: Hematology

## 2020-05-29 ENCOUNTER — Other Ambulatory Visit: Payer: Self-pay | Admitting: Hematology

## 2020-06-03 MED FILL — CAPECITABINE 500 MG TABLET: 500 | 28 days supply | Qty: 42 | Fill #0

## 2020-06-04 NOTE — Progress Notes (Signed)
[place]   [phone] [phone]   Clinic Follow up Note   Patient Care Team: Tanda Rockers, MD as PCP - General (Pulmonary Disease) Troy Sine, MD as PCP - Cardiology (Cardiology) 06/05/2020  CHIEF COMPLAINT: F/u gallbladder cancer   SUMMARY OF ONCOLOGIC HISTORY: Oncology History Overview Note  Cancer Staging Gallbladder cancer [facility]) Staging form: Gallbladder, AJCC 8th Edition - Clinical stage from 11/13/2018: Stage IVB (cTX, cN2, pM1) - Signed by Truitt Merle, MD on 12/15/2018     Gallbladder cancer ([place])  10/04/2018 Pathology Results   10/04/2018 Surgical Pathology Diagnosis 1. Lymph node, biopsy, mediastinal - LYMPH NODE WITH METASTATIC ADENOCARCINOMA. - SEE MICROSCOPIC DESCRIPTION 2. Plaque, coronary artery - CALCIFIED ATHEROSCLEROTIC PLAQUE.   10/14/2018 Miscellaneous   Foundation One:  MSI stable tumor mutation burden 3Muts/mb ERBB2 amplification CCNE1 amplification TP53 mutation(+) CDK6 amplification  HGF amplification    11/13/2018 Cancer Staging   Staging form: Gallbladder, AJCC 8th Edition - Clinical stage from 11/13/2018: Stage IVB (cTX, cN2, pM1) - Signed by Truitt Merle, MD on 12/15/2018   11/17/2018 Imaging   11/17/2018 CT CAP IMPRESSION: 1. 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 This is associated with extensive upper abdominal and retroperitoneal lymphadenopathy, as well as metastatic lymphadenopathy in the posterior mediastinum and left supraclavicular region. There is also a metastatic lesion to the left ischium. 2. 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 3. Aortic atherosclerosis, in addition to left main and 3 vessel coronary artery disease. Status post median sternotomy for CABG including LIMA to the LAD. 4. There are calcifications of the aortic valve. Echocardiographic correlation for evaluation of potential valvular  dysfunction may be warranted if clinically indicated.    11/21/2018 Initial Diagnosis   Gallbladder cancer ([place])   11/27/2018 Pathology Results   11/27/2018 CA19-9 immunohistochemical stain Per request, a CA19-9 immunohistochemical stain was performed at an outside institution revealing positive staining in the tumor cells.    12/15/2018 - 11/30/2019 Chemotherapy   First line chemo cisplatin and gemcitabine every 2 weeks on 12/15/18. Had 1 month chemo break in April, restarted on 04/20/19. Stopped 11/30/19 due to disease progression.    01/26/2019 -  Chemotherapy   Zometa q51month starting 01/26/19-11/02/19. Switched to monthly Xgeva injections on 04/03/20   02/20/2019 PET scan   Restaging PET scan:  IMPRESSION: 1. Positive response to therapy at all primary and metastatic sites. Persistent primary and metastatic lesions do have intense hypermetabolic activity albeit reduced. 2. Decrease in size and hypermetabolic activity of mass lesion in the gallbladder fundus. 3. Interval decrease in size and metabolic activity of periportal adenopathy. 4. Decrease in size and metabolic activity of periaortic lymph nodes. 5. Interval decrease in size and metabolic activity of destructive lesion in the LEFT inferior pubic ramus with new sclerosis. 6. No new or progressive disease.   05/29/2019 Imaging   CT CAP W Contrast  IMPRESSION: Known gallbladder adenocarcinoma, difficult to compare to recent PET, improved from prior CT.  Upper abdominal/retroperitoneal lymphadenopathy, slightly improved from prior PET.  Osseous metastasis involving the left inferior pubic ramus, grossly unchanged.  No evidence of metastatic disease in the chest.  Additional ancillary findings as above.   08/22/2019 Imaging   CT CAP W Contrast  IMPRESSION: 1. Stable soft tissue mass involving the gallbladder fundus. 2. Stable mild porta hepatis and retroperitoneal lymphadenopathy. 3. Stable sclerotic bone  metastases. 4. No new or progressive metastatic disease identified. 5. Stable 2.3 cm left thyroid  lobe nodule.   11/14/2019 Imaging   CT Left Hip WO  contrast  IMPRESSION: 1. 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 Associated soft tissue components have progressed, but there is no evidence of pathologic fracture. 2. No acute osseous findings. 3. 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   12/11/2019 PET scan   IMPRESSION: 1. Significant progression of malignancy, with increased activity in the gallbladder mass; considerable increase in size and activity of porta hepatis, retroperitoneal, and upper pelvic adenopathy; significant increase in size and metabolic activity of the left ischial metastatic lesion with extensive extraosseous component; and new mildly hypermetabolic left supraclavicular and thoracic periaortic lymph nodes suspicious for malignant involvement. 2. Faintly increased activity in a left thyroid nodule which was not previously hypermetabolic. Given the relatively low-grade activity, this may be amenable to surveillance, but a significant minority of thyroid nodules with hypermetabolic activity can represent thyroid cancer. 3. Other imaging findings of potential clinical significance: Chronic paranasal sinusitis. Chronic right middle lobe atelectasis. Aortic Atherosclerosis (ICD10-I70.0). Coronary atherosclerosis with cardiomegaly.   12/17/2019 - 01/07/2020 Radiation Therapy   Palliative Radiation with Dr. Lisbeth Renshaw 12/17/19-01/07/20   01/10/2020 - 04/28/2020 Chemotherapy   Xeloda 1500 mg BID 1 week on/1 week off starting on 01/10/20, she tolerated poorly and developed n/v, weakness, and low po intake. She was instructed to dose reduce to 500 mg BID which she continued into C2. Increased to 101m Am and 5065mPM on 02/12/20 and increased to 100059mID starting 02/26/20 starting with C4. Last dose taken 04/28/20 for this regimen   04/30/2020 PET  scan   IMPRESSION: 1. Increase in size of FDG avid gallbladder tumor. There is also been mild increase in size of FDG avid abdominal nodal metastases. Additionally, there is a new left-sided posterior mediastinal paravertebral FDG avid nodal metastases. 2. Similar appearance of small FDG avid left supraclavicular and subcarinal nodal metastases. Mild decrease in size of large sclerotic metastasis with soft tissue component involving the left acetabulum. 3.  Aortic Atherosclerosis (ICD10-I70.0).   05/06/2020 -  Chemotherapy   Second-line CAPOX q2weeks with Xeloda 1000m85m the AM and 500mg57mthe PM 1 week on/1 week off on 05/06/20 and Oxaliplatin starting 05/07/20. Due to poor toleration with n/v/d, we dose reduced Oxaliplatin to 40mg/62mtarting with C2 on 05/22/20.      CURRENT THERAPY:  -Zometa q3month22monthng 01/26/19-11/02/19. Switched to monthly Xgeva injectionson 04/03/20. -Second-line CAPOX q2weeks with Xeloda 1000mg in66m AM and 500mg in 34mPM 1 week on/1 week offon 05/06/20 and Oxaliplatin starting 05/07/20. Due to poor toleration with n/v/d, we dose reduced Oxaliplatin to 40mg/m2 s9ming with C2 on 05/22/20.   INTERVAL HISTORY: Ms. Fulmore retKunzor f/u and cycle 3 Oxaliplatin as scheduled. She continues Xeloda 1000 mg AM and 500 mg PM 1 week on/1 week off, started this morning. Fingertips get "chapped" but denies redness of sensitivity. Keeps vaseline on them. Last Oxaliplatin infusion on 6/3 was much better, had no nausea and felt better in general. Appetite is fair, takes megace PRN bc she read it can cause blood clots. Drinks boost/ensure if needed. Denies mucositis. Cold sensitivity lasts 3-4 days, no residual neuropathy. She had loose stool 2 days ago, resolved with imodium. Denies bleeding on plavix. Left leg/hip remains stiff but no pain like before radiation. She was previously on celebrex for pain that was more effective than oxycodone which she takes rarely. She wonders if she  can go back on it.    MEDICAL HISTORY:  Past  Medical History:  Diagnosis Date  . Allergic rhinitis   . Arthritis   . Asthma    xolair s 8/05 ?11/07; mastered hfa 12/20/08  . Benign positional vertigo   . CAD (coronary artery disease)    a. 09/2014 NSTEMI s/p LHC with sig 2V dz. dLAD diffusely diseased and not suitable for PCI. unsuccessful RCA PCI d/t heavy calcifications  . gallbladder ca dx'd 10/2018  . GERD (gastroesophageal reflux disease)   . Heart attack ([place])    09/2014  . Hyperlipidemia    <130 ldl pos fm hx, bp  . Hypertension   . Osteopenia    dexa 08/22/07 AP spine + 1.1, left femur -1.3, right femur -.8; dexa 10/06/09 +1.6, left femur =1.6, right femur -.  . PONV (postoperative nausea and vomiting)   . Ruptured disc, cervical   . 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 BREAST SURGERY  10-26-10   Rt. breast bx--for microcalcifications in rt. retroareolar region--dx was hyalinized fibroadenoma  . BUNIONECTOMY Bilateral   . CARDIAC CATHETERIZATION     2015  . CATARACT EXTRACTION Right 02/02/2018   Dr Satira Sark  . CATARACT EXTRACTION Left 03/30/2018  . [place] SURGERY  12/01  . CORONARY ARTERY BYPASS GRAFT N/A 10/04/2018   Procedure: CORONARY ARTERY BYPASS GRAFTING (CABG) times 2 using left  Internal mammary artery to LAD, left greater saphenous vein - open harvest.;  Surgeon: [name], MD;  Location: [place];  Service: Open Heart Surgery;  Laterality: N/A;  . IR IMAGING GUIDED PORT INSERTION  12/08/2018  . LEFT HEART CATH AND CORONARY ANGIOGRAPHY N/A 09/27/2018   Procedure: LEFT HEART CATH AND CORONARY ANGIOGRAPHY;  Surgeon: Troy Sine, MD;  Location: [place] CV LAB;  Service: Cardiovascular;  Laterality: N/A;  . LEFT HEART CATHETERIZATION WITH CORONARY ANGIOGRAM N/A 10/16/2014   Procedure: LEFT HEART CATHETERIZATION WITH CORONARY ANGIOGRAM;   Surgeon: Blane Ohara, MD;  Location[facility] CATH LAB;  Service: Cardiovascular;  Laterality: N/A;  . Left L4-5 transforaminal lumbar interbody fusion with Depuy cage, rods and screws, local and allograft bone graft, Vivigen; bilateral decompression/partial hemilaminectomy lumbar five-sacral one  07/2017  . PERCUTANEOUS CORONARY ROTOBLATOR INTERVENTION (PCI-R) N/A 10/17/2014   Procedure: PERCUTANEOUS CORONARY ROTOBLATOR INTERVENTION (PCI-R);  Surgeon: Troy Sine, MD;  Location[facility] C[facility] LAB;  Service: Cardiovascular;  Laterality: N/A;  . TEE WITHOUT CARDIOVERSION N/A 10/04/2018   Procedure: TRANSESOPHAGEAL ECHOCARDIOGRAM (TEE);  Surgeon: [name], MD;  Location: [place];  Service: Open Heart Surgery;  Laterality: N/A;  . VEIN LIGATION AND STRIPPING Left   . VIDEO BRONCHOSCOPY Bilateral 02/05/2015   Procedure: VIDEO BRONCHOSCOPY WITHOUT FLUORO;  Surgeon: Tanda Rockers, MD;  Location: [facility] ENDOSCOPY;  Service: Endoscopy;  Laterality: Bilateral;  . VULVA /PERINEUM BIOPSY  12-30-10   --epidermoid cyst    I have reviewed the social history and family history with the patient and they are unchanged from previous note.  ALLERGIES:  is allergic to fish-derived products, penicillins, aspirin, brilinta [ticagrelor], codeine phosphate, and diphenhydramine hcl.  MEDICATIONS:  Current Outpatient Medications  Medication Sig Dispense Refill  . albuterol (PROAIR HFA) 108 (90 Base) MCG/ACT inhaler INHALE 1 TO 2 PUFFS BY MOUTH EVERY 4 HOURS AS NEEDED FOR WHEEZING 8.5 g 3  . atorvastatin (LIPITOR) 40 MG tablet Take 1 tablet (40 mg total) by mouth daily. 90 tablet 3  . bisoprolol (ZEBETA) 10  MG tablet TAKE 1 TABLET(10 MG) BY MOUTH DAILY 90 tablet 2  . budesonide-formoterol (SYMBICORT) 160-4.5 MCG/ACT inhaler INHALE 2 PUFFS BY MOUTH EVERY 12 HOURS 30.6 g 3  . capecitabine (XELODA) 500 MG tablet Take 2 tablets in the am and 1 tablet in the pm.  7 days on 7 days off 42 tablet 0  . cetirizine (ZYRTEC) 10  MG tablet Take 10 mg by mouth at bedtime as needed for allergies.    . cholecalciferol 2000 units TABS Take 1 tablet (2,000 Units total) by mouth daily. 30 tablet 3  . clopidogrel (PLAVIX) 75 MG tablet TAKE 1 TABLET(75 MG) BY MOUTH DAILY 90 tablet 3  . dextromethorphan-guaiFENesin (MUCINEX DM) 30-600 MG per 12 hr tablet Take 1-2 tablets by mouth every 12 (twelve) hours as needed for cough (with flutter).     . famotidine (PEPCID) 20 MG tablet Take 20 mg by mouth as needed.     . ferrous sulfate 325 (65 FE) MG tablet Take 650 mg by mouth daily with breakfast.     . fluticasone (FLONASE) 50 MCG/ACT nasal spray Place 1-2 sprays into both nostrils 2 (two) times daily as needed for allergies or rhinitis.    Marland Kitchen lidocaine-prilocaine (EMLA) cream APPLY EXTERNALLY TO THE AFFECTED AREA ONCE DAILY AS DIRECTED 30 g 3  . magic mouthwash SOLN Take 5 mLs by mouth 4 (four) times daily. Leave Benadryl out of compound since pt allergic to it. Use hydrocortisone & nystatin. Swish & swallow or spit for thrush 240 mL 1  . MAGNESIUM-OXIDE 400 (241.3 Mg) MG tablet TAKE 1 TABLET(400 MG) BY MOUTH THREE TIMES DAILY 90 tablet 2  . meclizine (ANTIVERT) 25 MG tablet TAKE 2 TABLETS BY MOUTH THREE TIMES DAILY AS NEEDED 24 tablet 2  . megestrol (MEGACE ES) 625 MG/5ML suspension Take 5 mLs (625 mg total) by mouth daily. 150 mL 0  . montelukast (SINGULAIR) 10 MG tablet TAKE 1 TABLET(10 MG) BY MOUTH AT BEDTIME 90 tablet 1  . Multiple Vitamin (MULTIVITAMIN) capsule Take 1 capsule by mouth daily.     . ondansetron (ZOFRAN) 8 MG tablet Take 1 tablet (8 mg total) by mouth every 8 (eight) hours as needed for nausea or vomiting. 30 tablet 1  . oxyCODONE (OXY IR/ROXICODONE) 5 MG immediate release tablet Take 1 tablet (5 mg total) by mouth every 8 (eight) hours as needed for severe pain. 45 tablet 0  . sacubitril-valsartan (ENTRESTO) 24-26 MG Take 1 tablet by mouth 2 (two) times daily. 180 tablet 3   No current facility-administered  medications for this visit.   Facility-Administered Medications Ordered in Other Visits  Medication Dose Route Frequency Provider Last Rate Last Admin  . heparin lock flush 100 unit/mL  500 Units Intracatheter Once PRN Truitt Merle, MD      . oxaliplatin (ELOXATIN) 60 mg in dextrose 5 % 500 mL chemo infusion  40 mg/m2 (Treatment Plan Recorded) Intravenous Once Truitt Merle, MD 256 mL/hr at 06/05/20 1031 60 mg at 06/05/20 1031  . sodium chloride flush (NS) 0.9 % injection 10 mL  10 mL Intracatheter PRN Truitt Merle, MD        PHYSICAL EXAMINATION: ECOG PERFORMANCE STATUS: 1 - Symptomatic but completely ambulatory  Vitals:   06/05/20 0823  BP: (!) 145/70  Pulse: 62  Resp: 16  Temp: 97.7 F (36.5 C)  SpO2: 100%   Filed Weights   06/05/20 0823  Weight: 117 lb 11.2 oz (53.4 kg)    GENERAL:alert, no distress and  comfortable SKIN: no rash. Palms without erythema  EYES: sclera clear LUNGS:  normal breathing effort HEART:  no lower extremity edema NEURO: alert & oriented x 3 with fluent speech PAC without erythema  Limited exam for covid19 pandemic and no complaints   LABORATORY DATA:  I have reviewed the data as listed CBC Latest Ref Rng & Units 06/05/2020 05/22/2020 05/07/2020  WBC 4.0 - 10.5 K/uL 6.0 5.5 5.3  Hemoglobin 12.0 - 15.0 g/dL 11.2(L) 10.3(L) 10.7(L)  Hematocrit 36 - 46 % 33.4(L) 30.9(L) 31.7(L)  Platelets 150 - 400 K/uL 314 291 265     CMP Latest Ref Rng & Units 06/05/2020 05/22/2020 05/09/2020  Glucose 70 - 99 mg/dL 118(H) 113(H) 103(H)  BUN 8 - 23 mg/dL 10 10 17   Creatinine 0.44 - 1.00 mg/dL 0.99 1.00 1.11  Sodium 135 - 145 mmol/L 139 141 137  Potassium 3.5 - 5.1 mmol/L 4.3 3.5 3.9  Chloride 98 - 111 mmol/L 104 107 104  CO2 22 - 32 mmol/L 25 24 26   Calcium 8.9 - 10.3 mg/dL 10.0 9.5 10.4  Total Protein 6.5 - 8.1 g/dL 7.9 7.5 -  Total Bilirubin 0.3 - 1.2 mg/dL 0.6 0.7 -  Alkaline Phos 38 - 126 U/L 63 59 -  AST 15 - 41 U/L 34 26 -  ALT 0 - 44 U/L 17 14 -       RADIOGRAPHIC STUDIES: I have personally reviewed the radiological images as listed and agreed with the findings in the report. No results found.   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 -Started first line cisplatin and gemcitabine on 12/15/2018 q2 weeks due to advanced age and CHF; s/p 13 cycles on 11/30/19, she tolerated well with fatigue and G1 CIPN -She was onZometa q46monthstarting 01/26/19, [facility] to [facility] function  -UnfortunatelyPET from 12/22/20showedsignificant progression of diffuse nodal metastasis including new left supraclavicular and periaortic LN, increased activity of gallbladder mass, and increased size and metabolic activity of left ischial lesion with extensive extraosseous component. Cisplatin and gemcitabine was discontinued -she completed palliative RT and left hip/leg pain resolved, now only residual stiffness  -Although her tumor is HER2(+), her recent echo shows significant cardiomyopathy with EF 25-30% and grade III diastolic dysfunction, unfortunately she is not a candidate for herceptin/perjeta. She will f/u with cardiology. -She began Xeloda 1500 mg BID 1 week on/1 week off on 1/21, she tolerated poorly and developed n/v, weakness, and low po intake. She tolerated dose reduction much better.  -unfortunately she progressed in the gallbladder and abdominal nodal metastasis, and new posterior mediastinal nodal metastasis seen on PET 04/30/20 -She started dose-reduced CAPOX on 05/07/20, she tolerated cycle 1 poorly with n/v and fatigue, cycle 2 oxali was further dose reduced to 40 mg/m2. She takes Xeloda 1000 mg AM and 500 mg PM 1 week on/1 week off  2. Left leg pain  -PET on 12/22 showed significant increase in size and metabolic activity of the lesion and  extraosseous component  -She completedpalliative RT per Dr. MDanley Danker12/28/20 to 01/08/20 -pain and weakness resolved after radiation, now mild residual stiffness  3.[place] cycle 11cisplatin/gemcitabine, mild intermittent tingling to fingertips -no functional difficulties,tuning fork exam is normal -Improved onoral B complex vitamin, continue monitoring -denies residual neuropathy after cold sensitivity resolves by day 4  4. CAD, S/P CABGX2 on 09/27/2018,  EF 40-45% -Currently on Plavix, lasix PRN. Switched PPI to pepcid when she started Xeloda  -Echo 01/07/20 showed markedly reduced EF to 25-30% with severely decreased left ventricular function, Grade III diastolic dysfunction -Recent f/u with Dr. Claiborne Billings who started ARB, continue f/u   5. Asthma, dyspnea -Currently on Symbicort, Singulair, and albuterol as needed -f/u with Dr. Melvyn Novas  -baseline exertional dyspnea remains stable   6.Goal of care discussion -The patient understands the goal of care is palliative. -she is full code for now  7. Mild Anemia -Secondary to chemoand her open heart surgery -takes oral iron few times per week due to constipation   Disposition:  Ms. Clodfelter appears stable. She completed cycle 2 dose-reduced Oxaliplatin and continues Xeloda 1000 mg AM and 500 mg PM 1 week on/1 week off. She tolerated lower dose oxaliplatin much better, no n/v. Weight is stable. Mild diarrhea is well managed with imodium. She was able to recover well. Denies new pain. She is interested in going back on celebrex for left hip stiffness. Renal function has been good lately. I will discuss with pharmacy and her care team and follow up her with recommendations.   CBC and CMP are stable. CA 19-9 increased shortly after starting CAPOX, will monitor closely. Clinically she is doing well.  She will proceed with cycle 3 CAPOX at 40 mg/m2 and continue Xeloda 1000 mg AM and 500 mg PM 1 week on/1 week off which she  started today. She will receive monthly Xgeva today as well.   She will return for f/u and cycle 4 in 2 weeks.   All questions were answered. The patient knows to call the clinic with any problems, questions or concerns. No barriers to learning were detected.     Cassie Feeling, NP 06/05/20

## 2020-06-05 ENCOUNTER — Inpatient Hospital Stay: Payer: Medicare Other

## 2020-06-05 ENCOUNTER — Telehealth: Payer: Self-pay | Admitting: Nurse Practitioner

## 2020-06-05 ENCOUNTER — Encounter: Payer: Self-pay | Admitting: Nurse Practitioner

## 2020-06-05 ENCOUNTER — Inpatient Hospital Stay (HOSPITAL_BASED_OUTPATIENT_CLINIC_OR_DEPARTMENT_OTHER): Payer: Medicare Other | Admitting: Nurse Practitioner

## 2020-06-05 ENCOUNTER — Other Ambulatory Visit: Payer: Self-pay

## 2020-06-05 VITALS — BP 145/70 | HR 62 | Temp 97.7°F | Resp 16 | Ht 59.0 in | Wt 117.7 lb

## 2020-06-05 DIAGNOSIS — C23 Malignant neoplasm of gallbladder: Secondary | ICD-10-CM

## 2020-06-05 DIAGNOSIS — Z7189 Other specified counseling: Secondary | ICD-10-CM

## 2020-06-05 DIAGNOSIS — Z5111 Encounter for antineoplastic chemotherapy: Secondary | ICD-10-CM | POA: Diagnosis not present

## 2020-06-05 LAB — CBC WITH DIFFERENTIAL (CANCER CENTER ONLY)
Abs Immature Granulocytes: 0.02 10*3/uL (ref 0.00–0.07)
Basophils Absolute: 0 10*3/uL (ref 0.0–0.1)
Basophils Relative: 0 %
Eosinophils Absolute: 0.3 10*3/uL (ref 0.0–0.5)
Eosinophils Relative: 4 %
HCT: 33.4 % — ABNORMAL LOW (ref 36.0–46.0)
Hemoglobin: 11.2 g/dL — ABNORMAL LOW (ref 12.0–15.0)
Immature Granulocytes: 0 %
Lymphocytes Relative: 11 %
Lymphs Abs: 0.7 10*3/uL (ref 0.7–4.0)
MCH: 35.7 pg — ABNORMAL HIGH (ref 26.0–34.0)
MCHC: 33.5 g/dL (ref 30.0–36.0)
MCV: 106.4 fL — ABNORMAL HIGH (ref 80.0–100.0)
Monocytes Absolute: 0.5 10*3/uL (ref 0.1–1.0)
Monocytes Relative: 9 %
Neutro Abs: 4.5 10*3/uL (ref 1.7–7.7)
Neutrophils Relative %: 76 %
Platelet Count: 314 10*3/uL (ref 150–400)
RBC: 3.14 MIL/uL — ABNORMAL LOW (ref 3.87–5.11)
RDW: 17.9 % — ABNORMAL HIGH (ref 11.5–15.5)
WBC Count: 6 10*3/uL (ref 4.0–10.5)
nRBC: 0 % (ref 0.0–0.2)

## 2020-06-05 LAB — CMP (CANCER CENTER ONLY)
ALT: 17 U/L (ref 0–44)
AST: 34 U/L (ref 15–41)
Albumin: 3.4 g/dL — ABNORMAL LOW (ref 3.5–5.0)
Alkaline Phosphatase: 63 U/L (ref 38–126)
Anion gap: 10 (ref 5–15)
BUN: 10 mg/dL (ref 8–23)
CO2: 25 mmol/L (ref 22–32)
Calcium: 10 mg/dL (ref 8.9–10.3)
Chloride: 104 mmol/L (ref 98–111)
Creatinine: 0.99 mg/dL (ref 0.44–1.00)
GFR, Est AFR Am: >60 mL/min (ref >60)
GFR, Estimated: 55 mL/min — ABNORMAL LOW (ref >60)
Glucose, Bld: 118 mg/dL — ABNORMAL HIGH (ref 70–99)
Potassium: 4.3 mmol/L (ref 3.5–5.1)
Sodium: 139 mmol/L (ref 135–145)
Total Bilirubin: 0.6 mg/dL (ref 0.3–1.2)
Total Protein: 7.9 g/dL (ref 6.5–8.1)

## 2020-06-05 LAB — MAGNESIUM: Magnesium: 2 mg/dL (ref 1.7–2.4)

## 2020-06-05 MED ORDER — SODIUM CHLORIDE 0.9% FLUSH
10.0000 mL | Freq: Once | INTRAVENOUS | Status: AC | PRN
  Administered 2020-06-05: 10 mL
  Filled 2020-06-05: qty 10

## 2020-06-05 MED ORDER — OXALIPLATIN CHEMO INJECTION 100 MG/20ML
40.0000 mg/m2 | Freq: Once | INTRAVENOUS | Status: AC
  Administered 2020-06-05: 60 mg via INTRAVENOUS
  Filled 2020-06-05: qty 12

## 2020-06-05 MED ORDER — SODIUM CHLORIDE 0.9% FLUSH
10.0000 mL | INTRAVENOUS | Status: DC | PRN
  Administered 2020-06-05: 10 mL
  Filled 2020-06-05: qty 10

## 2020-06-05 MED ORDER — DEXTROSE 5 % IV SOLN
Freq: Once | INTRAVENOUS | Status: AC
  Filled 2020-06-05: qty 250

## 2020-06-05 MED ORDER — HEPARIN SOD (PORK) LOCK FLUSH 100 UNIT/ML IV SOLN
500.0000 [IU] | Freq: Once | INTRAVENOUS | Status: AC | PRN
  Administered 2020-06-05: 500 [IU]
  Filled 2020-06-05: qty 5

## 2020-06-05 MED ORDER — PALONOSETRON HCL INJECTION 0.25 MG/5ML
INTRAVENOUS | Status: AC
  Filled 2020-06-05: qty 5

## 2020-06-05 MED ORDER — PALONOSETRON HCL INJECTION 0.25 MG/5ML
0.2500 mg | Freq: Once | INTRAVENOUS | Status: AC
  Administered 2020-06-05: 0.25 mg via INTRAVENOUS

## 2020-06-05 MED ORDER — DENOSUMAB 120 MG/1.7ML ~~LOC~~ SOLN
120.0000 mg | Freq: Once | SUBCUTANEOUS | Status: AC
  Administered 2020-06-05: 120 mg via SUBCUTANEOUS

## 2020-06-05 MED ORDER — DENOSUMAB 120 MG/1.7ML ~~LOC~~ SOLN
SUBCUTANEOUS | Status: AC
  Filled 2020-06-05: qty 1.7

## 2020-06-05 MED ORDER — SODIUM CHLORIDE 0.9 % IV SOLN
10.0000 mg | Freq: Once | INTRAVENOUS | Status: AC
  Administered 2020-06-05: 10 mg via INTRAVENOUS
  Filled 2020-06-05: qty 10

## 2020-06-05 NOTE — Telephone Encounter (Signed)
Scheduled appt per 6/17 los.  Pt will get an updated appt calendar at her next scheduled appt

## 2020-06-05 NOTE — Patient Instructions (Signed)
[facility] Discharge Instructions for Patients Receiving Chemotherapy  Today you received the following chemotherapy agents: Oxaliplatin (Eloxatin)  To help prevent nausea and vomiting after your treatment, we encourage you to take your nausea medication as directed by your provider.   If you develop nausea and vomiting that is not controlled by your nausea medication, call the clinic.   BELOW ARE SYMPTOMS THAT SHOULD BE REPORTED IMMEDIATELY:  *FEVER GREATER THAN 100.5 F  *CHILLS WITH OR WITHOUT FEVER  NAUSEA AND VOMITING THAT IS NOT CONTROLLED WITH YOUR NAUSEA MEDICATION  *UNUSUAL SHORTNESS OF BREATH  *UNUSUAL BRUISING OR BLEEDING  TENDERNESS IN MOUTH AND THROAT WITH OR WITHOUT PRESENCE OF ULCERS  *URINARY PROBLEMS  *BOWEL PROBLEMS  UNUSUAL RASH Items with * indicate a potential emergency and should be followed up as soon as possible.  Feel free to call the clinic should you have any questions or concerns. The clinic phone number is [phone].  Please show the Jack at check-in to the Emergency Department and triage nurse.  Denosumab injection What is this medicine? DENOSUMAB (den oh sue mab) slows bone breakdown. Prolia is used to treat osteoporosis in women after menopause and in men, and in people who are taking corticosteroids for 6 months or more. Delton See is used to treat a high calcium level due to cancer and to prevent bone fractures and other bone problems caused by multiple myeloma or cancer bone metastases. Delton See is also used to treat giant cell tumor of the bone. This medicine may be used for other purposes; ask your health care provider or pharmacist if you have questions. COMMON BRAND NAME(S): Prolia, XGEVA What should I tell my health care provider before I take this medicine? They need to know if you have any of these conditions:  dental disease  having surgery or tooth extraction  infection  kidney disease  low levels of  calcium or Vitamin D in the blood  malnutrition  on hemodialysis  skin conditions or sensitivity  thyroid or parathyroid disease  an unusual reaction to denosumab, other medicines, foods, dyes, or preservatives  pregnant or trying to get pregnant  breast-feeding How should I use this medicine? This medicine is for injection under the skin. It is given by a health care professional in a hospital or clinic setting. A special MedGuide will be given to you before each treatment. Be sure to read this information carefully each time. For Prolia, talk to your pediatrician regarding the use of this medicine in children. Special care may be needed. For Delton See, talk to your pediatrician regarding the use of this medicine in children. While this drug may be prescribed for children as young as 13 years for selected conditions, precautions do apply. Overdosage: If you think you have taken too much of this medicine contact a poison control center or emergency room at once. NOTE: This medicine is only for you. Do not share this medicine with others. What if I miss a dose? It is important not to miss your dose. Call your doctor or health care professional if you are unable to keep an appointment. What may interact with this medicine? Do not take this medicine with any of the following medications:  other medicines containing denosumab This medicine may also interact with the following medications:  medicines that lower your chance of fighting infection  steroid medicines like prednisone or cortisone This list may not describe all possible interactions. Give your health care provider a list of all  the medicines, herbs, non-prescription drugs, or dietary supplements you use. Also tell them if you smoke, drink alcohol, or use illegal drugs. Some items may interact with your medicine. What should I watch for while using this medicine? Visit your doctor or health care professional for regular checks on  your progress. Your doctor or health care professional may order blood tests and other tests to see how you are doing. Call your doctor or health care professional for advice if you get a fever, chills or sore throat, or other symptoms of a cold or flu. Do not treat yourself. This drug may decrease your body's ability to fight infection. Try to avoid being around people who are sick. You should make sure you get enough calcium and vitamin D while you are taking this medicine, unless your doctor tells you not to. Discuss the foods you eat and the vitamins you take with your health care professional. See your dentist regularly. Brush and floss your teeth as directed. Before you have any dental work done, tell your dentist you are receiving this medicine. Do not become pregnant while taking this medicine or for 5 months after stopping it. Talk with your doctor or health care professional about your birth control options while taking this medicine. Women should inform their doctor if they wish to become pregnant or think they might be pregnant. There is a potential for serious side effects to an unborn child. Talk to your health care professional or pharmacist for more information. What side effects may I notice from receiving this medicine? Side effects that you should report to your doctor or health care professional as soon as possible:  allergic reactions like skin rash, itching or hives, swelling of the face, lips, or tongue  bone pain  breathing problems  dizziness  jaw pain, especially after dental work  redness, blistering, peeling of the skin  signs and symptoms of infection like fever or chills; cough; sore throat; pain or trouble passing urine  signs of low calcium like fast heartbeat, muscle cramps or muscle pain; pain, tingling, numbness in the hands or feet; seizures  unusual bleeding or bruising  unusually weak or tired Side effects that usually do not require medical attention  (report to your doctor or health care professional if they continue or are bothersome):  constipation  diarrhea  headache  joint pain  loss of appetite  muscle pain  runny nose  tiredness  upset stomach This list may not describe all possible side effects. Call your doctor for medical advice about side effects. You may report side effects to FDA at 1-800-FDA-1088. Where should I keep my medicine? This medicine is only given in a clinic, doctor's office, or other health care setting and will not be stored at home. NOTE: This sheet is a summary. It may not cover all possible information. If you have questions about this medicine, talk to your doctor, pharmacist, or health care provider.  2020 [employer] (2018-04-14 16:10:44)

## 2020-06-18 NOTE — Progress Notes (Signed)
[place]   [phone] [phone]   Clinic Follow up Note   Patient Care Team: Tanda Rockers, MD as PCP - General (Pulmonary Disease) Troy Sine, MD as PCP - Cardiology (Cardiology) 06/19/2020  CHIEF COMPLAINT: F/u gallbladder cancer   SUMMARY OF ONCOLOGIC HISTORY: Oncology History Overview Note  Cancer Staging Gallbladder cancer [facility]) Staging form: Gallbladder, AJCC 8th Edition - Clinical stage from 11/13/2018: Stage IVB (cTX, cN2, pM1) - Signed by Truitt Merle, MD on 12/15/2018     Gallbladder cancer ([place])  10/04/2018 Pathology Results   10/04/2018 Surgical Pathology Diagnosis 1. Lymph node, biopsy, mediastinal - LYMPH NODE WITH METASTATIC ADENOCARCINOMA. - SEE MICROSCOPIC DESCRIPTION 2. Plaque, coronary artery - CALCIFIED ATHEROSCLEROTIC PLAQUE.   10/14/2018 Miscellaneous   Foundation One:  MSI stable tumor mutation burden 3Muts/mb ERBB2 amplification CCNE1 amplification TP53 mutation(+) CDK6 amplification  HGF amplification    11/13/2018 Cancer Staging   Staging form: Gallbladder, AJCC 8th Edition - Clinical stage from 11/13/2018: Stage IVB (cTX, cN2, pM1) - Signed by Truitt Merle, MD on 12/15/2018   11/17/2018 Imaging   11/17/2018 CT CAP IMPRESSION: 1. 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 This is associated with extensive upper abdominal and retroperitoneal lymphadenopathy, as well as metastatic lymphadenopathy in the posterior mediastinum and left supraclavicular region. There is also a metastatic lesion to the left ischium. 2. 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 3. Aortic atherosclerosis, in addition to left main and 3 vessel coronary artery disease. Status post median sternotomy for CABG including LIMA to the LAD. 4. There are calcifications of the aortic valve. Echocardiographic correlation for evaluation of potential valvular dysfunction  may be warranted if clinically indicated.    11/21/2018 Initial Diagnosis   Gallbladder cancer ([place])   11/27/2018 Pathology Results   11/27/2018 CA19-9 immunohistochemical stain Per request, a CA19-9 immunohistochemical stain was performed at an outside institution revealing positive staining in the tumor cells.    12/15/2018 - 11/30/2019 Chemotherapy   First line chemo cisplatin and gemcitabine every 2 weeks on 12/15/18. Had 1 month chemo break in April, restarted on 04/20/19. Stopped 11/30/19 due to disease progression.    01/26/2019 -  Chemotherapy   Zometa q53month starting 01/26/19-11/02/19. Switched to monthly Xgeva injections on 04/03/20   02/20/2019 PET scan   Restaging PET scan:  IMPRESSION: 1. Positive response to therapy at all primary and metastatic sites. Persistent primary and metastatic lesions do have intense hypermetabolic activity albeit reduced. 2. Decrease in size and hypermetabolic activity of mass lesion in the gallbladder fundus. 3. Interval decrease in size and metabolic activity of periportal adenopathy. 4. Decrease in size and metabolic activity of periaortic lymph nodes. 5. Interval decrease in size and metabolic activity of destructive lesion in the LEFT inferior pubic ramus with new sclerosis. 6. No new or progressive disease.   05/29/2019 Imaging   CT CAP W Contrast  IMPRESSION: Known gallbladder adenocarcinoma, difficult to compare to recent PET, improved from prior CT.  Upper abdominal/retroperitoneal lymphadenopathy, slightly improved from prior PET.  Osseous metastasis involving the left inferior pubic ramus, grossly unchanged.  No evidence of metastatic disease in the chest.  Additional ancillary findings as above.   08/22/2019 Imaging   CT CAP W Contrast  IMPRESSION: 1. Stable soft tissue mass involving the gallbladder fundus. 2. Stable mild porta hepatis and retroperitoneal lymphadenopathy. 3. Stable sclerotic bone metastases. 4. No  new or progressive metastatic disease identified. 5. Stable 2.3 cm left thyroid  lobe nodule.   11/14/2019 Imaging   CT Left Hip WO  contrast  IMPRESSION: 1. 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 Associated soft tissue components have progressed, but there is no evidence of pathologic fracture. 2. No acute osseous findings. 3. 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   12/11/2019 PET scan   IMPRESSION: 1. Significant progression of malignancy, with increased activity in the gallbladder mass; considerable increase in size and activity of porta hepatis, retroperitoneal, and upper pelvic adenopathy; significant increase in size and metabolic activity of the left ischial metastatic lesion with extensive extraosseous component; and new mildly hypermetabolic left supraclavicular and thoracic periaortic lymph nodes suspicious for malignant involvement. 2. Faintly increased activity in a left thyroid nodule which was not previously hypermetabolic. Given the relatively low-grade activity, this may be amenable to surveillance, but a significant minority of thyroid nodules with hypermetabolic activity can represent thyroid cancer. 3. Other imaging findings of potential clinical significance: Chronic paranasal sinusitis. Chronic right middle lobe atelectasis. Aortic Atherosclerosis (ICD10-I70.0). Coronary atherosclerosis with cardiomegaly.   12/17/2019 - 01/07/2020 Radiation Therapy   Palliative Radiation with Dr. Lisbeth Renshaw 12/17/19-01/07/20   01/10/2020 - 04/28/2020 Chemotherapy   Xeloda 1500 mg BID 1 week on/1 week off starting on 01/10/20, she tolerated poorly and developed n/v, weakness, and low po intake. She was instructed to dose reduce to 500 mg BID which she continued into C2. Increased to 1029m Am and 5024mPM on 02/12/20 and increased to 100077mID starting 02/26/20 starting with C4. Last dose taken 04/28/20 for this regimen   04/30/2020 PET scan    IMPRESSION: 1. Increase in size of FDG avid gallbladder tumor. There is also been mild increase in size of FDG avid abdominal nodal metastases. Additionally, there is a new left-sided posterior mediastinal paravertebral FDG avid nodal metastases. 2. Similar appearance of small FDG avid left supraclavicular and subcarinal nodal metastases. Mild decrease in size of large sclerotic metastasis with soft tissue component involving the left acetabulum. 3.  Aortic Atherosclerosis (ICD10-I70.0).   05/06/2020 -  Chemotherapy   Second-line CAPOX q2weeks with Xeloda 1000m24m the AM and 500mg12mthe PM 1 week on/1 week off on 05/06/20 and Oxaliplatin starting 05/07/20. Due to poor toleration with n/v/d, we dose reduced Oxaliplatin to 40mg/71mtarting with C2 on 05/22/20.      CURRENT THERAPY:  -Zometa q3month73monthng 01/26/19-11/02/19. Switched to monthly Xgeva injectionson 04/03/20. -Second-line CAPOX q2weeks with Xeloda 1000mg in35m AM and 500mg in 38mPM 1 week on/1 week offon 05/06/20 and Oxaliplatin starting 05/07/20.Due to poor toleration with n/v/d, we dose reduced Oxaliplatin to 40mg/m2 s26ming with C2 on 05/22/20.  INTERVAL HISTORY: Ms. Apolinar retBollaor f/u and treatment as scheduled. She completed cycle 3 CAPOX on 06/05/20.  She feels well today.  After treatment she "feels bad" for 1-2 days with low energy and appetite then she improves.  Cold sensitivity lasts 4-5 days. She has residual tingling in the left index finger only.  P.o. intake is adequate.  Denies mucositis.  She has periodic diarrhea which she feels is diet related.  Has 1 loose stool per day then takes Imodium; denies nausea or vomiting.  Exertional dyspnea is stable.  Denies cough, chest pain, fever, chills, hand/foot redness or pain, or new/worsening pain.   MEDICAL HISTORY:  Past Medical History:  Diagnosis Date  . Allergic rhinitis   . Arthritis   . Asthma    xolair s 8/05 ?11/07; mastered hfa 12/20/08  . Benign  positional vertigo   .  CAD (coronary artery disease)    a. 09/2014 NSTEMI s/p LHC with sig 2V dz. dLAD diffusely diseased and not suitable for PCI. unsuccessful RCA PCI d/t heavy calcifications  . gallbladder ca dx'd 10/2018  . GERD (gastroesophageal reflux disease)   . Heart attack ([place])    09/2014  . Hyperlipidemia    <130 ldl pos fm hx, bp  . Hypertension   . Osteopenia    dexa 08/22/07 AP spine + 1.1, left femur -1.3, right femur -.8; dexa 10/06/09 +1.6, left femur =1.6, right femur -.  . PONV (postoperative nausea and vomiting)   . Ruptured disc, cervical   . 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 BREAST SURGERY  10-26-10   Rt. breast bx--for microcalcifications in rt. retroareolar region--dx was hyalinized fibroadenoma  . BUNIONECTOMY Bilateral   . CARDIAC CATHETERIZATION     2015  . CATARACT EXTRACTION Right 02/02/2018   Dr Satira Sark  . CATARACT EXTRACTION Left 03/30/2018  . [place] SURGERY  12/01  . CORONARY ARTERY BYPASS GRAFT N/A 10/04/2018   Procedure: CORONARY ARTERY BYPASS GRAFTING (CABG) times 2 using left  Internal mammary artery to LAD, left greater saphenous vein - open harvest.;  Surgeon: [name], MD;  Location: [facility];  Service: Open Heart Surgery;  Laterality: N/A;  . IR IMAGING GUIDED PORT INSERTION  12/08/2018  . LEFT HEART CATH AND CORONARY ANGIOGRAPHY N/A 09/27/2018   Procedure: LEFT HEART CATH AND CORONARY ANGIOGRAPHY;  Surgeon: Troy Sine, MD;  Location: [facility];  Service: Cardiovascular;  Laterality: N/A;  . LEFT HEART CATHETERIZATION WITH CORONARY ANGIOGRAM N/A 10/16/2014   Procedure: LEFT HEART CATHETERIZATION WITH CORONARY ANGIOGRAM;  Surgeon: Blane Ohara, MD;  Location[facility] C[facility];  Service: Cardiovascular;  Laterality: N/A;  . Left L4-5 transforaminal lumbar interbody fusion with Depuy cage, rods and screws, local  and allograft bone graft, Vivigen; bilateral decompression/partial hemilaminectomy lumbar five-sacral one  07/2017  . PERCUTANEOUS CORONARY ROTOBLATOR INTERVENTION (PCI-R) N/A 10/17/2014   Procedure: PERCUTANEOUS CORONARY ROTOBLATOR INTERVENTION (PCI-R);  Surgeon: Troy Sine, MD;  Location: [facility] CATH LAB;  Service: Cardiovascular;  Laterality: N/A;  . TEE WITHOUT CARDIOVERSION N/A 10/04/2018   Procedure: TRANSESOPHAGEAL ECHOCARDIOGRAM (TEE);  Surgeon: [name], MD;  Location: [place];  Service: Open Heart Surgery;  Laterality: N/A;  . VEIN LIGATION AND STRIPPING Left   . VIDEO BRONCHOSCOPY Bilateral 02/05/2015   Procedure: VIDEO BRONCHOSCOPY WITHOUT FLUORO;  Surgeon: Tanda Rockers, MD;  Location: [facility] ENDOS[facility];  Service: Endoscopy;  Laterality: Bilateral;  . VULVA /PERINEUM BIOPSY  12-30-10   --epidermoid cyst    I have reviewed the social history and family history with the patient and they are unchanged from previous note.  ALLERGIES:  is allergic to fish-derived products, penicillins, aspirin, brilinta [ticagrelor], codeine phosphate, and diphenhydramine hcl.  MEDICATIONS:  Current Outpatient Medications  Medication Sig Dispense Refill  . albuterol (PROAIR HFA) 108 (90 Base) MCG/ACT inhaler INHALE 1 TO 2 PUFFS BY MOUTH EVERY 4 HOURS AS NEEDED FOR WHEEZING 8.5 g 3  . atorvastatin (LIPITOR) 40 MG tablet Take 1 tablet (40 mg total) by mouth daily. 90 tablet 3  . bisoprolol (ZEBETA) 10 MG tablet TAKE 1 TABLET(10 MG) BY MOUTH DAILY 90 tablet 2  . budesonide-formoterol (SYMBICORT) 160-4.5 MCG/ACT inhaler INHALE 2 PUFFS BY MOUTH EVERY 12 HOURS 30.6 g 3  . capecitabine (XELODA) 500  MG tablet Take 2 tablets in the am and 1 tablet in the pm.  7 days on 7 days off 42 tablet 0  . cetirizine (ZYRTEC) 10 MG tablet Take 10 mg by mouth at bedtime as needed for allergies.    . cholecalciferol 2000 units TABS Take 1 tablet (2,000 Units total) by mouth daily. 30 tablet 3  . clopidogrel (PLAVIX)  75 MG tablet TAKE 1 TABLET(75 MG) BY MOUTH DAILY 90 tablet 3  . dextromethorphan-guaiFENesin (MUCINEX DM) 30-600 MG per 12 hr tablet Take 1-2 tablets by mouth every 12 (twelve) hours as needed for cough (with flutter).     . famotidine (PEPCID) 20 MG tablet Take 20 mg by mouth as needed.     . ferrous sulfate 325 (65 FE) MG tablet Take 650 mg by mouth daily with breakfast.     . fluticasone (FLONASE) 50 MCG/ACT nasal spray Place 1-2 sprays into both nostrils 2 (two) times daily as needed for allergies or rhinitis.    Marland Kitchen lidocaine-prilocaine (EMLA) cream APPLY EXTERNALLY TO THE AFFECTED AREA ONCE DAILY AS DIRECTED 30 g 3  . magic mouthwash SOLN Take 5 mLs by mouth 4 (four) times daily. Leave Benadryl out of compound since pt allergic to it. Use hydrocortisone & nystatin. Swish & swallow or spit for thrush 240 mL 1  . MAGNESIUM-OXIDE 400 (241.3 Mg) MG tablet TAKE 1 TABLET(400 MG) BY MOUTH THREE TIMES DAILY 90 tablet 2  . meclizine (ANTIVERT) 25 MG tablet TAKE 2 TABLETS BY MOUTH THREE TIMES DAILY AS NEEDED 24 tablet 2  . megestrol (MEGACE ES) 625 MG/5ML suspension Take 5 mLs (625 mg total) by mouth daily. 150 mL 0  . montelukast (SINGULAIR) 10 MG tablet TAKE 1 TABLET(10 MG) BY MOUTH AT BEDTIME 90 tablet 1  . Multiple Vitamin (MULTIVITAMIN) capsule Take 1 capsule by mouth daily.     . ondansetron (ZOFRAN) 8 MG tablet Take 1 tablet (8 mg total) by mouth every 8 (eight) hours as needed for nausea or vomiting. 30 tablet 1  . oxyCODONE (OXY IR/ROXICODONE) 5 MG immediate release tablet Take 1 tablet (5 mg total) by mouth every 8 (eight) hours as needed for severe pain. 45 tablet 0  . sacubitril-valsartan (ENTRESTO) 24-26 MG Take 1 tablet by mouth 2 (two) times daily. 180 tablet 3   No current facility-administered medications for this visit.    PHYSICAL EXAMINATION: ECOG PERFORMANCE STATUS: 0 - Asymptomatic  Vitals:   06/19/20 1051  BP: 102/68  Pulse: 66  Resp: 17  Temp: (!) 97.5 F (36.4 C)   SpO2: 99%   Filed Weights   06/19/20 1051  Weight: 117 lb 1.6 oz (53.1 kg)    GENERAL:alert, no distress and comfortable SKIN: No rash to exposed skin.  Palms without erythema EYES: sclera clear OROPHARYNX: No thrush or ulcers LUNGS: normal breathing effort HEART:  no lower extremity edema NEURO: alert & oriented x 3 with fluent speech, normal gait PAC without erythema   LABORATORY DATA:  I have reviewed the data as listed CBC Latest Ref Rng & Units 06/19/2020 06/05/2020 05/22/2020  WBC 4.0 - 10.5 K/uL 5.4 6.0 5.5  Hemoglobin 12.0 - 15.0 g/dL 11.3(L) 11.2(L) 10.3(L)  Hematocrit 36 - 46 % 34.0(L) 33.4(L) 30.9(L)  Platelets 150 - 400 K/uL 341 314 291     CMP Latest Ref Rng & Units 06/05/2020 05/22/2020 05/09/2020  Glucose 70 - 99 mg/dL 118(H) 113(H) 103(H)  BUN 8 - 23 mg/dL 10 10 17   Creatinine 0.44 -  1.00 mg/dL 0.99 1.00 1.11  Sodium 135 - 145 mmol/L 139 141 137  Potassium 3.5 - 5.1 mmol/L 4.3 3.5 3.9  Chloride 98 - 111 mmol/L 104 107 104  CO2 22 - 32 mmol/L 25 24 26   Calcium 8.9 - 10.3 mg/dL 10.0 9.5 10.4  Total Protein 6.5 - 8.1 g/dL 7.9 7.5 -  Total Bilirubin 0.3 - 1.2 mg/dL 0.6 0.7 -  Alkaline Phos 38 - 126 U/L 63 59 -  AST 15 - 41 U/L 34 26 -  ALT 0 - 44 U/L 17 14 -      RADIOGRAPHIC STUDIES: I have personally reviewed the radiological images as listed and agreed with the findings in the report. No results found.   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 -Started first line cisplatin and gemcitabine on 12/15/2018 q2 weeks due to advanced age and CHF; s/p 13 cycles on 11/30/19, she tolerated well with fatigue and G1 CIPN -She was onZometa q51monthstarting 01/26/19, [facility] to [facility] function  -UnfortunatelyPET from 12/22/20showedsignificant progression of diffuse  nodal metastasis including new left supraclavicular and periaortic LN, increased activity of gallbladder mass, and increased size and metabolic activity of left ischial lesion with extensive extraosseous component. Cisplatin and gemcitabine was discontinued -she completed palliative RT and left hip/leg pain resolved, now only residual stiffness  -Although her tumor is HER2(+), her recent echo shows significant cardiomyopathy with EF 25-30%and grade III diastolic dysfunction, unfortunately she is not a candidate for herceptin/perjeta. She will f/u with cardiology. -She began Xeloda 1500 mg BID 1 week on/1 week off on 1/21, she tolerated poorly and developed n/v, weakness, and low po intake. She tolerated dose reduction much better.  -unfortunately she progressed in the gallbladder and abdominal nodal metastasis, and new posterior mediastinal nodal metastasis seen on PET 04/30/20 -She started dose-reduced CAPOX on 05/07/20, she tolerated cycle 1 poorly with n/v and fatigue, cycle 2 oxali was further dose reduced to 40 mg/m2. She takes Xeloda 1000 mg AM and 500 mg PM 1 week on/1 week off, S/p 3 cycles   2. Left leg pain  -PET on 12/22 showed significant increase in size and metabolic activity of the lesion and extraosseous component  -She completedpalliative RT per Dr. MDanley Danker12/28/20 to 01/08/20 -pain and weakness resolved after radiation, now mild residual stiffness  3.GPattersonwith cycle 11cisplatin/gemcitabine, mild intermittent tingling to fingertips -no functional difficulties,tuning fork exam is normal -Improved onoral B complex vitamin, continue monitoring -Cold sensitivity last 4-5 days, residual tingling in left index finger only  4. CAD, S/P CABGX2 on 09/27/2018, EF 40-45% -Currently on Plavix, lasix PRN. Switched PPI to pepcid when she started Xeloda  -Echo 01/07/20 showed markedly reduced EF to 25-30% with severely decreased left ventricular function, Grade III  diastolic dysfunction -Followed by Dr. KClaiborne Billingswho started ARB, continue f/u  5. Asthma, dyspnea -Currently on Symbicort, Singulair, and albuterol as needed -f/u with Dr. WMelvyn Novas -baseline exertional dyspnea remains stable  6.Goal of care discussion -The patient understands the goal of care is palliative. -she is full code for now  7. Mild Anemia -Secondary to chemoand her open heart surgery -takes oral iron few times per week due to constipation  -Stable   Disposition: Ms. BSachsappears stable.  She completed 3 cycles of dose reduced Capox.  She tolerates treatment well with  mild fatigue, low appetite and periodic diarrhea which is well managed.  No skin toxicities.  She is able to recover well.  CBC is stable.  CMP is pending, if adequate she will proceed with treatment.   She was hoping to go back on Celebrex for body pain.  Per discussion with pharmacy there are potential DDI's with Celebrex, Xeloda, plavix, and Entresto.  I recommend to hold Celebrex.  Her pain is managed with Tylenol as needed.    She will proceed with Cycle 4 Capox as planned without dose modifications.  She will return for follow-up and cycle 5 in 2 weeks.  Will order restaging scan at next visit.  All questions were answered. The patient knows to call the clinic with any problems, questions or concerns. No barriers to learning were detected.     Alla Feeling, NP 06/19/20

## 2020-06-19 ENCOUNTER — Other Ambulatory Visit: Payer: Self-pay

## 2020-06-19 ENCOUNTER — Inpatient Hospital Stay: Payer: Medicare Other

## 2020-06-19 ENCOUNTER — Telehealth: Payer: Self-pay | Admitting: Nurse Practitioner

## 2020-06-19 ENCOUNTER — Inpatient Hospital Stay (HOSPITAL_BASED_OUTPATIENT_CLINIC_OR_DEPARTMENT_OTHER): Payer: Medicare Other | Admitting: Nurse Practitioner

## 2020-06-19 ENCOUNTER — Encounter: Payer: Self-pay | Admitting: Nurse Practitioner

## 2020-06-19 ENCOUNTER — Inpatient Hospital Stay: Payer: Medicare Other | Attending: Obstetrics

## 2020-06-19 VITALS — BP 102/68 | HR 66 | Temp 97.5°F | Resp 17 | Ht 59.0 in | Wt 117.1 lb

## 2020-06-19 DIAGNOSIS — Z5111 Encounter for antineoplastic chemotherapy: Secondary | ICD-10-CM | POA: Insufficient documentation

## 2020-06-19 DIAGNOSIS — I2510 Atherosclerotic heart disease of native coronary artery without angina pectoris: Secondary | ICD-10-CM | POA: Insufficient documentation

## 2020-06-19 DIAGNOSIS — C23 Malignant neoplasm of gallbladder: Secondary | ICD-10-CM

## 2020-06-19 DIAGNOSIS — D6481 Anemia due to antineoplastic chemotherapy: Secondary | ICD-10-CM | POA: Diagnosis not present

## 2020-06-19 DIAGNOSIS — G629 Polyneuropathy, unspecified: Secondary | ICD-10-CM | POA: Diagnosis not present

## 2020-06-19 DIAGNOSIS — Z7189 Other specified counseling: Secondary | ICD-10-CM

## 2020-06-19 DIAGNOSIS — C772 Secondary and unspecified malignant neoplasm of intra-abdominal lymph nodes: Secondary | ICD-10-CM | POA: Insufficient documentation

## 2020-06-19 DIAGNOSIS — M8580 Other specified disorders of bone density and structure, unspecified site: Secondary | ICD-10-CM | POA: Insufficient documentation

## 2020-06-19 DIAGNOSIS — C7951 Secondary malignant neoplasm of bone: Secondary | ICD-10-CM | POA: Insufficient documentation

## 2020-06-19 LAB — CMP (CANCER CENTER ONLY)
ALT: 23 U/L (ref 0–44)
AST: 41 U/L (ref 15–41)
Albumin: 3.5 g/dL (ref 3.5–5.0)
Alkaline Phosphatase: 65 U/L (ref 38–126)
Anion gap: 11 (ref 5–15)
BUN: 10 mg/dL (ref 8–23)
CO2: 25 mmol/L (ref 22–32)
Calcium: 9.2 mg/dL (ref 8.9–10.3)
Chloride: 104 mmol/L (ref 98–111)
Creatinine: 0.98 mg/dL (ref 0.44–1.00)
GFR, Est AFR Am: >60 mL/min (ref >60)
GFR, Estimated: 56 mL/min — ABNORMAL LOW (ref >60)
Glucose, Bld: 90 mg/dL (ref 70–99)
Potassium: 4.5 mmol/L (ref 3.5–5.1)
Sodium: 140 mmol/L (ref 135–145)
Total Bilirubin: 0.5 mg/dL (ref 0.3–1.2)
Total Protein: 7.5 g/dL (ref 6.5–8.1)

## 2020-06-19 LAB — MAGNESIUM: Magnesium: 1.8 mg/dL (ref 1.7–2.4)

## 2020-06-19 LAB — CBC WITH DIFFERENTIAL (CANCER CENTER ONLY)
Abs Immature Granulocytes: 0.03 10*3/uL (ref 0.00–0.07)
Basophils Absolute: 0 10*3/uL (ref 0.0–0.1)
Basophils Relative: 1 %
Eosinophils Absolute: 0.3 10*3/uL (ref 0.0–0.5)
Eosinophils Relative: 5 %
HCT: 34 % — ABNORMAL LOW (ref 36.0–46.0)
Hemoglobin: 11.3 g/dL — ABNORMAL LOW (ref 12.0–15.0)
Immature Granulocytes: 1 %
Lymphocytes Relative: 12 %
Lymphs Abs: 0.7 10*3/uL (ref 0.7–4.0)
MCH: 34.6 pg — ABNORMAL HIGH (ref 26.0–34.0)
MCHC: 33.2 g/dL (ref 30.0–36.0)
MCV: 104 fL — ABNORMAL HIGH (ref 80.0–100.0)
Monocytes Absolute: 0.6 10*3/uL (ref 0.1–1.0)
Monocytes Relative: 11 %
Neutro Abs: 3.8 10*3/uL (ref 1.7–7.7)
Neutrophils Relative %: 70 %
Platelet Count: 341 10*3/uL (ref 150–400)
RBC: 3.27 MIL/uL — ABNORMAL LOW (ref 3.87–5.11)
RDW: 17.7 % — ABNORMAL HIGH (ref 11.5–15.5)
WBC Count: 5.4 10*3/uL (ref 4.0–10.5)
nRBC: 0 % (ref 0.0–0.2)

## 2020-06-19 MED ORDER — SODIUM CHLORIDE 0.9% FLUSH
10.0000 mL | INTRAVENOUS | Status: DC | PRN
  Administered 2020-06-19: 10 mL
  Filled 2020-06-19: qty 10

## 2020-06-19 MED ORDER — DEXTROSE 5 % IV SOLN
Freq: Once | INTRAVENOUS | Status: AC
  Filled 2020-06-19: qty 250

## 2020-06-19 MED ORDER — SODIUM CHLORIDE 0.9% FLUSH
10.0000 mL | Freq: Once | INTRAVENOUS | Status: AC | PRN
  Administered 2020-06-19: 10 mL
  Filled 2020-06-19: qty 10

## 2020-06-19 MED ORDER — OXALIPLATIN CHEMO INJECTION 100 MG/20ML
40.0000 mg/m2 | Freq: Once | INTRAVENOUS | Status: AC
  Administered 2020-06-19: 60 mg via INTRAVENOUS
  Filled 2020-06-19: qty 12

## 2020-06-19 MED ORDER — HEPARIN SOD (PORK) LOCK FLUSH 100 UNIT/ML IV SOLN
500.0000 [IU] | Freq: Once | INTRAVENOUS | Status: AC | PRN
  Administered 2020-06-19: 500 [IU]
  Filled 2020-06-19: qty 5

## 2020-06-19 MED ORDER — PALONOSETRON HCL INJECTION 0.25 MG/5ML
0.2500 mg | Freq: Once | INTRAVENOUS | Status: AC
  Administered 2020-06-19: 0.25 mg via INTRAVENOUS

## 2020-06-19 MED ORDER — DIPHENHYDRAMINE HCL 25 MG PO CAPS
ORAL_CAPSULE | ORAL | Status: AC
  Filled 2020-06-19: qty 2

## 2020-06-19 MED ORDER — SODIUM CHLORIDE 0.9 % IV SOLN
10.0000 mg | Freq: Once | INTRAVENOUS | Status: AC
  Administered 2020-06-19: 10 mg via INTRAVENOUS
  Filled 2020-06-19: qty 10

## 2020-06-19 MED ORDER — PALONOSETRON HCL INJECTION 0.25 MG/5ML
INTRAVENOUS | Status: AC
  Filled 2020-06-19: qty 5

## 2020-06-19 MED ORDER — ACETAMINOPHEN 325 MG PO TABS
ORAL_TABLET | ORAL | Status: AC
  Filled 2020-06-19: qty 2

## 2020-06-19 NOTE — Telephone Encounter (Signed)
Scheduled per 7/1 los. Messaged RN Brayton Layman to give pt updated calendar.

## 2020-06-19 NOTE — Patient Instructions (Signed)
[place] Discharge Instructions for Patients Receiving Chemotherapy  Today you received the following chemotherapy agent: Oxaliplatin  To help prevent nausea and vomiting after your treatment, we encourage you to take your nausea medication as directed by your MD.   If you develop nausea and vomiting that is not controlled by your nausea medication, call the clinic.   BELOW ARE SYMPTOMS THAT SHOULD BE REPORTED IMMEDIATELY:  *FEVER GREATER THAN 100.5 F  *CHILLS WITH OR WITHOUT FEVER  NAUSEA AND VOMITING THAT IS NOT CONTROLLED WITH YOUR NAUSEA MEDICATION  *UNUSUAL SHORTNESS OF BREATH  *UNUSUAL BRUISING OR BLEEDING  TENDERNESS IN MOUTH AND THROAT WITH OR WITHOUT PRESENCE OF ULCERS  *URINARY PROBLEMS  *BOWEL PROBLEMS  UNUSUAL RASH Items with * indicate a potential emergency and should be followed up as soon as possible.  Feel free to call the clinic should you have any questions or concerns. The clinic phone number is [phone].  Please show the [place] at check-in to the Emergency Department and triage nurse.

## 2020-06-20 LAB — CANCER ANTIGEN 19-9: CA 19-9: 1129 U/mL — ABNORMAL HIGH (ref 0–35)

## 2020-06-26 ENCOUNTER — Other Ambulatory Visit: Payer: Self-pay | Admitting: Hematology

## 2020-06-26 MED ORDER — CAPECITABINE 500 MG PO TABS: ORAL_TABLET | ORAL | 0 refills | Status: DC

## 2020-06-26 NOTE — Telephone Encounter (Signed)
Printed copy given to dr Burr Medico 06/26/2020

## 2020-06-30 MED FILL — CAPECITABINE 500 MG TABLET: 500 | 28 days supply | Qty: 42 | Fill #0

## 2020-06-30 NOTE — Progress Notes (Signed)
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 Lymph node, biopsy, mediastinal - LYMPH NODE WITH METASTATIC ADENOCARCINOMA. - SEE MICROSCOPIC DESCRIPTION 2. Plaque, coronary artery - CALCIFIED ATHEROSCLEROTIC PLAQUE.   10/14/2018 Miscellaneous   Foundation One:  MSI stable tumor mutation burden 3Muts/mb ERBB2 amplification CCNE1 amplification TP53 mutation(+) CDK6 amplification  HGF amplification    11/13/2018 Cancer Staging   Staging form: Gallbladder, AJCC 8th Edition - Clinical stage from 11/13/2018: Stage IVB (cTX, cN2, pM1) - Signed by Truitt Merle, MD on 12/15/2018   11/17/2018 Imaging   11/17/2018 CT CAP IMPRESSION: 1. 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 This is associated with extensive upper abdominal and retroperitoneal lymphadenopathy, as well as metastatic lymphadenopathy in the posterior mediastinum and left supraclavicular region. There is also a metastatic lesion to the left ischium. 2. 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 3. Aortic atherosclerosis, in addition to left main and 3 vessel coronary artery disease. Status post median sternotomy for CABG including LIMA to the LAD. 4. There are calcifications of the aortic valve. Echocardiographic correlation for  evaluation of potential valvular dysfunction may be warranted if clinically indicated.    11/21/2018 Initial Diagnosis   Gallbladder cancer ([place])   11/27/2018 Pathology Results   11/27/2018 CA19-9 immunohistochemical stain Per request, a CA19-9 immunohistochemical stain was performed at an outside institution revealing positive staining in the tumor cells.    12/15/2018 - 11/30/2019 Chemotherapy   First line chemo cisplatin and gemcitabine every 2 weeks on 12/15/18. Had 1 month chemo break in April, restarted on 04/20/19. Stopped 11/30/19 due to disease progression.    01/26/2019 -  Chemotherapy   Zometa q70month starting 01/26/19-11/02/19. Switched to monthly Xgeva injections on 04/03/20   02/20/2019 PET scan   Restaging PET scan:  IMPRESSION: 1. Positive response to therapy at all primary and metastatic sites. Persistent primary and metastatic lesions do have intense hypermetabolic activity albeit reduced. 2. Decrease in size and hypermetabolic activity of mass lesion in the gallbladder fundus. 3. Interval decrease in size and metabolic activity of periportal adenopathy. 4. Decrease in size and metabolic activity of periaortic lymph nodes. 5. Interval decrease in size and metabolic activity of destructive lesion in the LEFT inferior pubic ramus with new sclerosis. 6. No new or progressive disease.   05/29/2019 Imaging   CT CAP W Contrast  IMPRESSION: Known gallbladder adenocarcinoma, difficult to compare to recent PET, improved from prior CT.  Upper abdominal/retroperitoneal lymphadenopathy, slightly improved from prior PET.  Osseous metastasis involving the left inferior pubic ramus, grossly unchanged.  No evidence of metastatic disease in the chest.  Additional ancillary findings as above.   08/22/2019 Imaging   CT CAP W Contrast  IMPRESSION: 1. Stable soft tissue mass involving the gallbladder fundus. 2. Stable mild porta hepatis and retroperitoneal  lymphadenopathy. 3. Stable sclerotic bone metastases. 4. No new or progressive metastatic disease  identified. 5. Stable 2.3 cm left thyroid lobe nodule.   11/14/2019 Imaging   CT Left Hip WO  contrast  IMPRESSION: 1. 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 Associated soft tissue components have progressed, but there is no evidence of pathologic fracture. 2. No acute osseous findings. 3. 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   12/11/2019 PET scan   IMPRESSION: 1. Significant progression of malignancy, with increased activity in the gallbladder mass; considerable increase in size and activity of porta hepatis, retroperitoneal, and upper pelvic adenopathy; significant increase in size and metabolic activity of the left ischial metastatic lesion with extensive extraosseous component; and new mildly hypermetabolic left supraclavicular and thoracic periaortic lymph nodes suspicious for malignant involvement. 2. Faintly increased activity in a left thyroid nodule which was not previously hypermetabolic. Given the relatively low-grade activity, this may be amenable to surveillance, but a significant minority of thyroid nodules with hypermetabolic activity can represent thyroid cancer. 3. Other imaging findings of potential clinical significance: Chronic paranasal sinusitis. Chronic right middle lobe atelectasis. Aortic Atherosclerosis (ICD10-I70.0). Coronary atherosclerosis with cardiomegaly.   12/17/2019 - 01/07/2020 Radiation Therapy   Palliative Radiation with Dr. Lisbeth Renshaw 12/17/19-01/07/20   01/10/2020 - 04/28/2020 Chemotherapy   Xeloda 1500 mg BID 1 week on/1 week off starting on 01/10/20, she tolerated poorly and developed n/v, weakness, and low po intake. She was instructed to dose reduce to 500 mg BID which she continued into C2. Increased to '1000mg'$  Am and '500mg'$  PM on 02/12/20 and increased to '1000mg'$  BID starting 02/26/20 starting with C4. Last dose taken  04/28/20 for this regimen   04/30/2020 PET scan   IMPRESSION: 1. Increase in size of FDG avid gallbladder tumor. There is also been mild increase in size of FDG avid abdominal nodal metastases. Additionally, there is a new left-sided posterior mediastinal paravertebral FDG avid nodal metastases. 2. Similar appearance of small FDG avid left supraclavicular and subcarinal nodal metastases. Mild decrease in size of large sclerotic metastasis with soft tissue component involving the left acetabulum. 3.  Aortic Atherosclerosis (ICD10-I70.0).   05/06/2020 -  Chemotherapy   Second-line CAPOX q2weeks with Xeloda '1000mg'$  in the AM and '500mg'$  in the PM 1 week on/1 week off on 05/06/20 and Oxaliplatin starting 05/07/20. Due to poor toleration with n/v/d, we dose reduced Oxaliplatin to '40mg'$ /m2 starting with C2 on 05/22/20.       CURRENT THERAPY:  -Zometa q32monthstarting 01/26/19-11/02/19. Switched to monthly Xgeva injectionson 04/03/20. -Second-line CAPOX q2weeks with Xeloda '1000mg'$  in the AM and '500mg'$  in the PM 1 week on/1 week offon 05/06/20 and Oxaliplatin starting 05/07/20. Due to poor toleration with n/v/d, we dose reduced Oxaliplatin to '40mg'$ /m2 starting with C2 on 05/22/20.   INTERVAL HISTORY:  KTASMINE HIPWELLis here for a follow up and treatment. She presents to the clinic alone. She notes she is doing well. She is able to tolerate her lower dose oxaliplatin. She notes cold sensitivity and neuropathy in 2 of her left fingers which may or may not be related to chemo. She notes she is ambidextrous. She denies dropping things. She notes having diarrhea with runny stool during the week on Xeloda. She notes imodium helps. She notes trying to drink enough water. She denies LE edema and denies any pain. She is able to take care of herself independently at home. She continues to take Xeloda '1000mg'$  in the AM and '500mg'$  in the PM. She has not taken megace because of risk of blood clot. She notes she may take  it if  she feels she needs it.     REVIEW OF SYSTEMS:   Constitutional: Denies fevers, chills or abnormal weight loss Eyes: Denies blurriness of vision Ears, nose, mouth, throat, and face: Denies mucositis or sore throat Respiratory: Denies cough, dyspnea or wheezes Cardiovascular: Denies palpitation, chest discomfort or lower extremity swelling Gastrointestinal:  Denies nausea, heartburn or change in bowel habits Skin: Denies abnormal skin rashes Lymphatics: Denies new lymphadenopathy or easy bruising Neurological: (+) Mild neuropathy in 2 of her left fingers  Behavioral/Psych: Mood is stable, no new changes  All other systems were reviewed with the patient and are negative.  MEDICAL HISTORY:  Past Medical History:  Diagnosis Date   Allergic rhinitis    Arthritis    Asthma    xolair s 8/05 ?11/07; mastered hfa 12/20/08   Benign positional vertigo    CAD (coronary artery disease)    a. 09/2014 NSTEMI s/p LHC with sig 2V dz. dLAD diffusely diseased and not suitable for PCI. unsuccessful RCA PCI d/t heavy calcifications   gallbladder ca dx'd 10/2018   GERD (gastroesophageal reflux disease)    Heart attack (HCC)    09/2014   Hyperlipidemia    <130 ldl pos fm hx, bp   Hypertension    Osteopenia    dexa 08/22/07 AP spine + 1.1, left femur -1.3, right femur -.8; dexa 10/06/09 +1.6, left femur =1.6, right femur -.   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 breast bx--for microcalcifications in rt. retroareolar region--dx was hyalinized fibroadenoma   BUNIONECTOMY Bilateral    CARDIAC CATHETERIZATION     2015   CATARACT EXTRACTION Right 02/02/2018   Dr Burgess Estelle   CATARACT EXTRACTION Left 03/30/2018   CERVICAL DISC SURGERY  12/01   CORONARY ARTERY BYPASS GRAFT N/A  10/04/2018   Procedure: CORONARY ARTERY BYPASS GRAFTING (CABG) times 2 using left  Internal mammary artery to LAD, left greater saphenous vein - open harvest.;  Surgeon: Loreli Slot, MD;  Location: [facility] OR;  Service: Open Heart Surgery;  Laterality: N/A;   IR IMAGING GUIDED PORT INSERTION  12/08/2018   LEFT HEART CATH AND CORONARY ANGIOGRAPHY N/A 09/27/2018   Procedure: LEFT HEART CATH AND CORONARY ANGIOGRAPHY;  Surgeon: Lennette Bihari, MD;  Location: [facility] INVASIVE CV LAB;  Service: Cardiovascular;  Laterality: N/A;   LEFT HEART CATHETERIZATION WITH CORONARY ANGIOGRAM N/A 10/16/2014   Procedure: LEFT HEART CATHETERIZATION WITH CORONARY ANGIOGRAM;  Surgeon: Micheline Chapman, MD;  Location: [facility] CATH LAB;  Service: Cardiovascular;  Laterality: N/A;   Left L4-5 transforaminal lumbar interbody fusion with Depuy cage, rods and screws, local and allograft bone graft, Vivigen; bilateral decompression/partial hemilaminectomy lumbar five-sacral one  07/2017   PERCUTANEOUS CORONARY ROTOBLATOR INTERVENTION (PCI-R) N/A 10/17/2014   Procedure: PERCUTANEOUS CORONARY ROTOBLATOR INTERVENTION (PCI-R);  Surgeon: Lennette Bihari, MD;  Location: [facility] CATH LAB;  Service: Cardiovascular;  Laterality: N/A;   TEE WITHOUT CARDIOVERSION N/A 10/04/2018   Procedure: TRANSESOPHAGEAL ECHOCARDIOGRAM (TEE);  Surgeon: Loreli Slot, MD;  Location: [facility] OR;  Service: Open Heart Surgery;  Laterality: N/A;   VEIN LIGATION AND STRIPPING Left    VIDEO BRONCHOSCOPY Bilateral 02/05/2015   Procedure: VIDEO BRONCHOSCOPY WITHOUT FLUORO;  Surgeon: Nyoka Cowden, MD;  Location: [facility] ENDOSCOPY;  Service: Endoscopy;  Laterality:  Bilateral;   VULVA /PERINEUM BIOPSY  12-30-10   --epidermoid cyst    I have reviewed the social history and family history with the patient and they are unchanged from previous note.  ALLERGIES:  is allergic to fish-derived products, penicillins, aspirin, brilinta [ticagrelor], codeine phosphate, and  diphenhydramine hcl.  MEDICATIONS:  Current Outpatient Medications  Medication Sig Dispense Refill   albuterol (PROAIR HFA) 108 (90 Base) MCG/ACT inhaler INHALE 1 TO 2 PUFFS BY MOUTH EVERY 4 HOURS AS NEEDED FOR WHEEZING 8.5 g 3   atorvastatin (LIPITOR) 40 MG tablet Take 1 tablet (40 mg total) by mouth daily. 90 tablet 3   bisoprolol (ZEBETA) 10 MG tablet TAKE 1 TABLET(10 MG) BY MOUTH DAILY 90 tablet 2   budesonide-formoterol (SYMBICORT) 160-4.5 MCG/ACT inhaler INHALE 2 PUFFS BY MOUTH EVERY 12 HOURS 30.6 g 3   capecitabine (XELODA) 500 MG tablet Take 2 tablets in the am and 1 tablet in the pm.  7 days on 7 days off 42 tablet 0   cetirizine (ZYRTEC) 10 MG tablet Take 10 mg by mouth at bedtime as needed for allergies.     cholecalciferol 2000 units TABS Take 1 tablet (2,000 Units total) by mouth daily. 30 tablet 3   clopidogrel (PLAVIX) 75 MG tablet TAKE 1 TABLET(75 MG) BY MOUTH DAILY 90 tablet 3   dextromethorphan-guaiFENesin (MUCINEX DM) 30-600 MG per 12 hr tablet Take 1-2 tablets by mouth every 12 (twelve) hours as needed for cough (with flutter).      famotidine (PEPCID) 20 MG tablet Take 20 mg by mouth as needed.      ferrous sulfate 325 (65 FE) MG tablet Take 650 mg by mouth daily with breakfast.      fluticasone (FLONASE) 50 MCG/ACT nasal spray Place 1-2 sprays into both nostrils 2 (two) times daily as needed for allergies or rhinitis.     lidocaine-prilocaine (EMLA) cream APPLY EXTERNALLY TO THE AFFECTED AREA ONCE DAILY AS DIRECTED 30 g 3   losartan (COZAAR) 25 MG tablet Take 25 mg by mouth daily.     magic mouthwash SOLN Take 5 mLs by mouth 4 (four) times daily. Leave Benadryl out of compound since pt allergic to it. Use hydrocortisone & nystatin. Swish & swallow or spit for thrush 240 mL 1   MAGNESIUM-OXIDE 400 (241.3 Mg) MG tablet TAKE 1 TABLET(400 MG) BY MOUTH THREE TIMES DAILY 90 tablet 2   meclizine (ANTIVERT) 25 MG tablet TAKE 2 TABLETS BY MOUTH THREE TIMES DAILY AS  NEEDED 24 tablet 2   megestrol (MEGACE ES) 625 MG/5ML suspension Take 5 mLs (625 mg total) by mouth daily. 150 mL 0   montelukast (SINGULAIR) 10 MG tablet TAKE 1 TABLET(10 MG) BY MOUTH AT BEDTIME 90 tablet 1   Multiple Vitamin (MULTIVITAMIN) capsule Take 1 capsule by mouth daily.      ondansetron (ZOFRAN) 8 MG tablet Take 1 tablet (8 mg total) by mouth every 8 (eight) hours as needed for nausea or vomiting. 30 tablet 1   oxyCODONE (OXY IR/ROXICODONE) 5 MG immediate release tablet Take 1 tablet (5 mg total) by mouth every 8 (eight) hours as needed for severe pain. 45 tablet 0   sacubitril-valsartan (ENTRESTO) 24-26 MG Take 1 tablet by mouth 2 (two) times daily. 180 tablet 3   No current facility-administered medications for this visit.    PHYSICAL EXAMINATION: ECOG PERFORMANCE STATUS: 1 - Symptomatic but completely ambulatory  Vitals:   07/03/20 0929  BP: 139/70  Pulse: (!) 57  Resp: 17  Temp: 97.8 F (36.6 C)  SpO2: 99%   Filed Weights   07/03/20 0929  Weight: 116 lb 3.2 oz (52.7 kg)    Due to COVID19 we will limit examination to appearance. Patient had no complaints.  GENERAL:alert, no distress and comfortable SKIN: skin color normal, no rashes or significant lesions EYES: normal, Conjunctiva are pink and non-injected, sclera clear  NEURO: alert & oriented x 3 with fluent speech   LABORATORY DATA:  I have reviewed the data as listed CBC Latest Ref Rng & Units 07/03/2020 06/19/2020 06/05/2020  WBC 4.0 - 10.5 K/uL 6.3 5.4 6.0  Hemoglobin 12.0 - 15.0 g/dL 11.7(L) 11.3(L) 11.2(L)  Hematocrit 36 - 46 % 34.9(L) 34.0(L) 33.4(L)  Platelets 150 - 400 K/uL 306 341 314     CMP Latest Ref Rng & Units 07/03/2020 06/19/2020 06/05/2020  Glucose 70 - 99 mg/dL 93 90 118(H)  BUN 8 - 23 mg/dL _0 Creatinine 0.44 - 1.00 mg/dL 0.93 0.98 0.99  Sodium 135 - 145 mmol/L 141 140 139  Potassium 3.5 - 5.1 mmol/L 4.4 4.5 4.3  Chloride 98 - 111 mmol/L 104 104 104  CO2 22 - 32 mmol/L _1 Calcium 8.9 - 10.3 mg/dL 10.1 9.2 10.0  Total Protein 6.5 - 8.1 g/dL 8.1 7.5 7.9  Total Bilirubin 0.3 - 1.2 mg/dL 0.6 0.5 0.6  Alkaline Phos 38 - 126 U/L 60 65 63  AST 15 - 41 U/L 33 41 34  ALT 0 - 44 U/L _2 RADIOGRAPHIC STUDIES: I have personally reviewed the radiological images as listed and agreed with the findings in the report. No results found.   ASSESSMENT & PLAN:  Cassie Campbell is a 79 y.o. 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  -She is also onZometa q52monthstarting 01/26/19. Switched to monthly Xgeva on 04/03/20 due to kidney function. -UnfortunatelyPET from 12/22/20showedsignificant progression of diffuse nodal metastasis, increased activity of gallbladder mass, and increased size and metabolic activity of left ischial lesion. Cisplatin and gemcitabine was discontinued.  -She began Xeloda 1500 mg BID 1 week on/1 week off on 1/21, she tolerated poorly and developed n/v, weakness, and low po intake.She was able to slowly titrate up from low dose 5012mBID to 100080mID by C4 on 02/26/20. -Unfortunately herPET from 5/12/21showedincreased size of gallbladder tumor and abdominal nodal metastasis. She does have newposterior mediastinalnodal metastasis. Overall scan showeddisease progression.  -Pt started CAPOX every 2 weeks.Due to her advanced age and limited social support,we proceeded with low-dose Oxaliplatin at 57m54mstarting 5/19/21and Xeloda1000mg56mand 500mg 42m week on and 1 week offstarting on5/18/21. Due to poor toleration with N&V, Fatigue and indigestion, C2 Oxaliplatin will be reduced to 40mg/m77marting 05/22/20.  -She is tolerating lower dose oxaliplatin well.  She overall tolerated chemo with manageable diarrhea and stable minimal neuropathy. Labs reviewed and adequate to proceed with C5 oxaliplatin at same dose. Start C5 Xeloda today at same dose 1 week on/1 week off.  -Proceed with Xgeva today and continue monthly.  -Will proceed with next PET scan before next cycle chemo and f/u in 2 weeks.    2.Left hip pain -PET on 12/22 showed significant increase in size and metabolic activity of  the lesion and extraosseous component.Ipreviouslydiscussed her pain is likely related to her cancer. -For her Pain she completedpalliative RT per Dr. Danley Danker 12/17/19 to 01/08/20 -Pain has resolved now.   3. CAD, S/P CABGX2 on 09/27/2018, EF25-30% -Currently on Plavix.  -f/u with Dr. Claiborne Billings -recent ECHO showed worsening EF  4. Asthma, dyspnea -Currently on Symbicort, Singulair, and albuterol as needed -f/u with pulmonary, stable, no recent flare -She has recently beenlittle more dyspneic. She can use dexa after chemo, and as needed for worsening wheezing  5.Goal of care discussion -The patient understands the goal of care is palliative. -she is full code for now  6. Mild Anemia -Secondary to chemoand her recent open heart surgery -I discussed her anemia may contribute to her mild SOB. -She has not taken oral iron because it causes bloating. I encouraged her to take prenatal vitamin. -Previously normal, but mildly low lately.Stable  7. Weight loss, Low appetite -Her weight was previously down but now stable. Appetite adequate lately.  -Ipreviouslyoffered her Dietician referral, she declined. -She has megace but hesitant to start due to risk of blood clots. She is willing to try if she feels she needs it. I encouraged her to remain active.   8. Elevated Cr  -Cr has been elevated since being on Xeloda.  -Lasix has been held  -I encouraged her to drink enough water to manage her heart function and her Cr level. -She notes at  recent PCP visit she had A1c of 6.6 and mild protein in urine. She is not currently on DM medication. Will monitor.  9. 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  -She has had this for some time, stable and manageable. Will monitor on CAPOX.    Plan -Labs reviewed and adequate to proceed with C5 Oxaliplatin at same dose -Continue with C5 Xeloda today, 1049m in the AM and 5052min the PM, 1 week on/1 week off -Proceed with Xgeva injection today, continue monthly  -f/uand Oxaliplatin in 2 weeks with lab, flush and PET scan a few days before     No problem-specific Assessment & Plan notes found for this encounter.   Orders Placed This Encounter  Procedures   NM PET Image Restag (PS) Skull Base To Thigh    Standing Status:   Future    Standing Expiration Date:   07/03/2021    Order Specific Question:   If indicated for the ordered procedure, I authorize the administration of a radiopharmaceutical per Radiology protocol    Answer:   Yes    Order Specific Question:   Preferred imaging location?    Answer:   [facility]  Order Specific Question:   Radiology Contrast Protocol - do NOT remove file path    Answer:   [email or web address]   All questions were answered. The patient knows to call the clinic with any problems, questions or concerns. No barriers to learning was detected. The total time spent in the appointment was 30 minutes.     YaTruitt MerleMD 07/03/2020   I, AmJoslyn Devonam acting as scribe for YaTruitt MerleMD.   I have reviewed the above documentation for accuracy and completeness, and I agree with the above.

## 2020-07-03 ENCOUNTER — Encounter: Payer: Self-pay | Admitting: Hematology

## 2020-07-03 ENCOUNTER — Inpatient Hospital Stay: Payer: Medicare Other

## 2020-07-03 ENCOUNTER — Inpatient Hospital Stay (HOSPITAL_BASED_OUTPATIENT_CLINIC_OR_DEPARTMENT_OTHER): Payer: Medicare Other | Admitting: Hematology

## 2020-07-03 ENCOUNTER — Other Ambulatory Visit: Payer: Self-pay

## 2020-07-03 ENCOUNTER — Telehealth: Payer: Self-pay | Admitting: Hematology

## 2020-07-03 VITALS — BP 139/70 | HR 57 | Temp 97.8°F | Resp 17 | Ht 59.0 in | Wt 116.2 lb

## 2020-07-03 DIAGNOSIS — C23 Malignant neoplasm of gallbladder: Secondary | ICD-10-CM

## 2020-07-03 DIAGNOSIS — Z7189 Other specified counseling: Secondary | ICD-10-CM

## 2020-07-03 DIAGNOSIS — Z5111 Encounter for antineoplastic chemotherapy: Secondary | ICD-10-CM | POA: Diagnosis not present

## 2020-07-03 LAB — CMP (CANCER CENTER ONLY)
ALT: 15 U/L (ref 0–44)
AST: 33 U/L (ref 15–41)
Albumin: 3.4 g/dL — ABNORMAL LOW (ref 3.5–5.0)
Alkaline Phosphatase: 60 U/L (ref 38–126)
Anion gap: 11 (ref 5–15)
BUN: 10 mg/dL (ref 8–23)
CO2: 26 mmol/L (ref 22–32)
Calcium: 10.1 mg/dL (ref 8.9–10.3)
Chloride: 104 mmol/L (ref 98–111)
Creatinine: 0.93 mg/dL (ref 0.44–1.00)
GFR, Est AFR Am: >60 mL/min (ref >60)
GFR, Estimated: 59 mL/min — ABNORMAL LOW (ref >60)
Glucose, Bld: 93 mg/dL (ref 70–99)
Potassium: 4.4 mmol/L (ref 3.5–5.1)
Sodium: 141 mmol/L (ref 135–145)
Total Bilirubin: 0.6 mg/dL (ref 0.3–1.2)
Total Protein: 8.1 g/dL (ref 6.5–8.1)

## 2020-07-03 LAB — CBC WITH DIFFERENTIAL (CANCER CENTER ONLY)
Abs Immature Granulocytes: 0.02 10*3/uL (ref 0.00–0.07)
Basophils Absolute: 0 10*3/uL (ref 0.0–0.1)
Basophils Relative: 0 %
Eosinophils Absolute: 0.3 10*3/uL (ref 0.0–0.5)
Eosinophils Relative: 4 %
HCT: 34.9 % — ABNORMAL LOW (ref 36.0–46.0)
Hemoglobin: 11.7 g/dL — ABNORMAL LOW (ref 12.0–15.0)
Immature Granulocytes: 0 %
Lymphocytes Relative: 10 %
Lymphs Abs: 0.6 10*3/uL — ABNORMAL LOW (ref 0.7–4.0)
MCH: 35.1 pg — ABNORMAL HIGH (ref 26.0–34.0)
MCHC: 33.5 g/dL (ref 30.0–36.0)
MCV: 104.8 fL — ABNORMAL HIGH (ref 80.0–100.0)
Monocytes Absolute: 0.7 10*3/uL (ref 0.1–1.0)
Monocytes Relative: 11 %
Neutro Abs: 4.7 10*3/uL (ref 1.7–7.7)
Neutrophils Relative %: 75 %
Platelet Count: 306 10*3/uL (ref 150–400)
RBC: 3.33 MIL/uL — ABNORMAL LOW (ref 3.87–5.11)
RDW: 17 % — ABNORMAL HIGH (ref 11.5–15.5)
WBC Count: 6.3 10*3/uL (ref 4.0–10.5)
nRBC: 0 % (ref 0.0–0.2)

## 2020-07-03 LAB — MAGNESIUM: Magnesium: 1.8 mg/dL (ref 1.7–2.4)

## 2020-07-03 MED ORDER — SODIUM CHLORIDE 0.9% FLUSH
10.0000 mL | INTRAVENOUS | Status: DC | PRN
  Administered 2020-07-03: 10 mL
  Filled 2020-07-03: qty 10

## 2020-07-03 MED ORDER — PALONOSETRON HCL INJECTION 0.25 MG/5ML
0.2500 mg | Freq: Once | INTRAVENOUS | Status: AC
  Administered 2020-07-03: 0.25 mg via INTRAVENOUS

## 2020-07-03 MED ORDER — DEXTROSE 5 % IV SOLN
Freq: Once | INTRAVENOUS | Status: AC
  Filled 2020-07-03: qty 250

## 2020-07-03 MED ORDER — PALONOSETRON HCL INJECTION 0.25 MG/5ML
INTRAVENOUS | Status: AC
  Filled 2020-07-03: qty 5

## 2020-07-03 MED ORDER — OXALIPLATIN CHEMO INJECTION 100 MG/20ML
40.0000 mg/m2 | Freq: Once | INTRAVENOUS | Status: AC
  Administered 2020-07-03: 60 mg via INTRAVENOUS
  Filled 2020-07-03: qty 12

## 2020-07-03 MED ORDER — DENOSUMAB 120 MG/1.7ML ~~LOC~~ SOLN
120.0000 mg | Freq: Once | SUBCUTANEOUS | Status: AC
  Administered 2020-07-03: 120 mg via SUBCUTANEOUS

## 2020-07-03 MED ORDER — SODIUM CHLORIDE 0.9 % IV SOLN
10.0000 mg | Freq: Once | INTRAVENOUS | Status: AC
  Administered 2020-07-03: 10 mg via INTRAVENOUS
  Filled 2020-07-03: qty 10

## 2020-07-03 MED ORDER — HEPARIN SOD (PORK) LOCK FLUSH 100 UNIT/ML IV SOLN
500.0000 [IU] | Freq: Once | INTRAVENOUS | Status: AC | PRN
  Administered 2020-07-03: 500 [IU]
  Filled 2020-07-03: qty 5

## 2020-07-03 MED ORDER — SODIUM CHLORIDE 0.9% FLUSH
10.0000 mL | Freq: Once | INTRAVENOUS | Status: AC | PRN
  Administered 2020-07-03: 10 mL
  Filled 2020-07-03: qty 10

## 2020-07-03 MED ORDER — DENOSUMAB 120 MG/1.7ML ~~LOC~~ SOLN
SUBCUTANEOUS | Status: AC
  Filled 2020-07-03: qty 1.7

## 2020-07-03 NOTE — Telephone Encounter (Signed)
Scheduled per 7/15 los. Noted to give pt updated calendar during tx.

## 2020-07-03 NOTE — Patient Instructions (Signed)
[facility] Discharge Instructions for Patients Receiving Chemotherapy  Today you received the following chemotherapy agents: oxaliplatin.  To help prevent nausea and vomiting after your treatment, we encourage you to take your nausea medication as directed.   If you develop nausea and vomiting that is not controlled by your nausea medication, call the clinic.   BELOW ARE SYMPTOMS THAT SHOULD BE REPORTED IMMEDIATELY:  *FEVER GREATER THAN 100.5 F  *CHILLS WITH OR WITHOUT FEVER  NAUSEA AND VOMITING THAT IS NOT CONTROLLED WITH YOUR NAUSEA MEDICATION  *UNUSUAL SHORTNESS OF BREATH  *UNUSUAL BRUISING OR BLEEDING  TENDERNESS IN MOUTH AND THROAT WITH OR WITHOUT PRESENCE OF ULCERS  *URINARY PROBLEMS  *BOWEL PROBLEMS  UNUSUAL RASH Items with * indicate a potential emergency and should be followed up as soon as possible.  Feel free to call the clinic should you have any questions or concerns. The clinic phone number is [phone].  Please show the CHEMO ALERT CARD at check-in to the Emergency Department and triage nurse.   

## 2020-07-16 ENCOUNTER — Encounter: Payer: Self-pay | Admitting: Obstetrics and Gynecology

## 2020-07-16 NOTE — Progress Notes (Signed)
Cassie Campbell   [phone] [phone]   Clinic Follow up Note   Patient Care Team: Tanda Rockers, MD as PCP - General (Pulmonary Disease) Troy Sine, MD as PCP - Cardiology (Cardiology) 07/17/2020  CHIEF COMPLAINT: Follow-up metastatic gallbladder cancer  SUMMARY OF ONCOLOGIC HISTORY: Oncology History Overview Note  Cancer Staging Gallbladder cancer [facility]) Staging form: Gallbladder, AJCC 8th Edition - Clinical stage from 11/13/2018: Stage IVB (cTX, cN2, pM1) - Signed by Truitt Merle, MD on 12/15/2018     Gallbladder cancer ([place])  10/04/2018 Pathology Results   10/04/2018 Surgical Pathology Diagnosis 1. Lymph node, biopsy, mediastinal - LYMPH NODE WITH METASTATIC ADENOCARCINOMA. - SEE MICROSCOPIC DESCRIPTION 2. Plaque, coronary artery - CALCIFIED ATHEROSCLEROTIC PLAQUE.   10/14/2018 Miscellaneous   Foundation One:  MSI stable tumor mutation burden 3Muts/mb ERBB2 amplification CCNE1 amplification TP53 mutation(+) CDK6 amplification  HGF amplification    11/13/2018 Cancer Staging   Staging form: Gallbladder, AJCC 8th Edition - Clinical stage from 11/13/2018: Stage IVB (cTX, cN2, pM1) - Signed by Truitt Merle, MD on 12/15/2018   11/17/2018 Imaging   11/17/2018 CT CAP IMPRESSION: 1. 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 This is associated with extensive upper abdominal and retroperitoneal lymphadenopathy, as well as metastatic lymphadenopathy in the posterior mediastinum and left supraclavicular region. There is also a metastatic lesion to the left ischium. 2. 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 3. Aortic atherosclerosis, in addition to left main and 3 vessel coronary artery disease. Status post median sternotomy for CABG including LIMA to the LAD. 4. There are calcifications of the aortic valve. Echocardiographic correlation for evaluation of potential  valvular dysfunction may be warranted if clinically indicated.    11/21/2018 Initial Diagnosis   Gallbladder cancer ([place])   11/27/2018 Pathology Results   11/27/2018 CA19-9 immunohistochemical stain Per request, a CA19-9 immunohistochemical stain was performed at an outside institution revealing positive staining in the tumor cells.    12/15/2018 - 11/30/2019 Chemotherapy   First line chemo cisplatin and gemcitabine every 2 weeks on 12/15/18. Had 1 month chemo break in April, restarted on 04/20/19. Stopped 11/30/19 due to disease progression.    01/26/2019 -  Chemotherapy   Zometa q69month starting 01/26/19-11/02/19. Switched to monthly Xgeva injections on 04/03/20   02/20/2019 PET scan   Restaging PET scan:  IMPRESSION: 1. Positive response to therapy at all primary and metastatic sites. Persistent primary and metastatic lesions do have intense hypermetabolic activity albeit reduced. 2. Decrease in size and hypermetabolic activity of mass lesion in the gallbladder fundus. 3. Interval decrease in size and metabolic activity of periportal adenopathy. 4. Decrease in size and metabolic activity of periaortic lymph nodes. 5. Interval decrease in size and metabolic activity of destructive lesion in the LEFT inferior pubic ramus with new sclerosis. 6. No new or progressive disease.   05/29/2019 Imaging   CT CAP W Contrast  IMPRESSION: Known gallbladder adenocarcinoma, difficult to compare to recent PET, improved from prior CT.  Upper abdominal/retroperitoneal lymphadenopathy, slightly improved from prior PET.  Osseous metastasis involving the left inferior pubic ramus, grossly unchanged.  No evidence of metastatic disease in the chest.  Additional ancillary findings as above.   08/22/2019 Imaging   CT CAP W Contrast  IMPRESSION: 1. Stable soft tissue mass involving the gallbladder fundus. 2. Stable mild porta hepatis and retroperitoneal lymphadenopathy. 3. Stable sclerotic bone  metastases. 4. No new or progressive metastatic disease identified. 5. Stable 2.3 cm left thyroid  lobe nodule.   11/14/2019 Imaging   CT Left Hip WO  contrast  IMPRESSION: 1. 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 Associated soft tissue components have progressed, but there is no evidence of pathologic fracture. 2. No acute osseous findings. 3. 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   12/11/2019 PET scan   IMPRESSION: 1. Significant progression of malignancy, with increased activity in the gallbladder mass; considerable increase in size and activity of porta hepatis, retroperitoneal, and upper pelvic adenopathy; significant increase in size and metabolic activity of the left ischial metastatic lesion with extensive extraosseous component; and new mildly hypermetabolic left supraclavicular and thoracic periaortic lymph nodes suspicious for malignant involvement. 2. Faintly increased activity in a left thyroid nodule which was not previously hypermetabolic. Given the relatively low-grade activity, this may be amenable to surveillance, but a significant minority of thyroid nodules with hypermetabolic activity can represent thyroid cancer. 3. Other imaging findings of potential clinical significance: Chronic paranasal sinusitis. Chronic right middle lobe atelectasis. Aortic Atherosclerosis (ICD10-I70.0). Coronary atherosclerosis with cardiomegaly.   12/17/2019 - 01/07/2020 Radiation Therapy   Palliative Radiation with Dr. Lisbeth Renshaw 12/17/19-01/07/20   01/10/2020 - 04/28/2020 Chemotherapy   Xeloda 1500 mg BID 1 week on/1 week off starting on 01/10/20, she tolerated poorly and developed n/v, weakness, and low po intake. She was instructed to dose reduce to 500 mg BID which she continued into C2. Increased to 1050m Am and 5030mPM on 02/12/20 and increased to 100082mID starting 02/26/20 starting with C4. Last dose taken 04/28/20 for this regimen   04/30/2020 PET  scan   IMPRESSION: 1. Increase in size of FDG avid gallbladder tumor. There is also been mild increase in size of FDG avid abdominal nodal metastases. Additionally, there is a new left-sided posterior mediastinal paravertebral FDG avid nodal metastases. 2. Similar appearance of small FDG avid left supraclavicular and subcarinal nodal metastases. Mild decrease in size of large sclerotic metastasis with soft tissue component involving the left acetabulum. 3.  Aortic Atherosclerosis (ICD10-I70.0).   05/06/2020 -  Chemotherapy   Second-line CAPOX q2weeks with Xeloda 1000m34m the AM and 500mg87mthe PM 1 week on/1 week off on 05/06/20 and Oxaliplatin starting 05/07/20. Due to poor toleration with n/v/d, we dose reduced Oxaliplatin to 40mg/68mtarting with C2 on 05/22/20.      CURRENT THERAPY:  -Zometa q3month4monthng 01/26/19-11/02/19. Switched to monthly Xgeva injectionson 04/03/20. -Second-line CAPOX q2weeks with Xeloda 1000mg in15m AM and 500mg in 92mPM 1 week on/1 week offon 05/06/20 and Oxaliplatin starting 05/07/20.Due to poor toleration with n/v/d, we dose reduced Oxaliplatin to 40mg/m2 s24ming with C2 on 05/22/20.  INTERVAL HISTORY: Ms. Dolezal retEssesor follow-up and treatment as scheduled.  She began cycle 5 Capox and received Xgeva on 07/03/2020.  Two days ago she had transient right upper quadrant pain that resolved after taking Tylenol and Pepcid.  She feels well today, denies problems with chemo.  Denies nausea/vomiting.  She has diarrhea after meals on week she is on Xeloda, managed with Imodium. Cold sensitivity last 4 days after oxaliplatin.  She has numbness in her index fingers, without functional difficulties.  Denies hand/foot redness or pain.  Denies mucositis.  Appetite and energy are decent.  She denies pain but has stiffness especially in the morning when she starts moving.  Denies fever, chills, cough, chest pain, dyspnea.   MEDICAL HISTORY:  Past Medical History:    Diagnosis Date  . Allergic rhinitis   . Arthritis   .  Asthma    xolair s 8/05 ?11/07; mastered hfa 12/20/08  . Benign positional vertigo   . CAD (coronary artery disease)    a. 09/2014 NSTEMI s/p LHC with sig 2V dz. dLAD diffusely diseased and not suitable for PCI. unsuccessful RCA PCI d/t heavy calcifications  . gallbladder ca dx'd 10/2018  . GERD (gastroesophageal reflux disease)   . Heart attack ([place])    09/2014  . Hyperlipidemia    <130 ldl pos fm hx, bp  . Hypertension   . Osteopenia    dexa 08/22/07 AP spine + 1.1, left femur -1.3, right femur -.8; dexa 10/06/09 +1.6, left femur =1.6, right femur -.  . PONV (postoperative nausea and vomiting)   . Ruptured disc, cervical   . 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 BREAST SURGERY  10-26-10   Rt. breast bx--for microcalcifications in rt. retroareolar region--dx was hyalinized fibroadenoma  . BUNIONECTOMY Bilateral   . CARDIAC CATHETERIZATION     2015  . CATARACT EXTRACTION Right 02/02/2018   Dr Satira Sark  . CATARACT EXTRACTION Left 03/30/2018  . [place] SURGERY  12/01  . CORONARY ARTERY BYPASS GRAFT N/A 10/04/2018   Procedure: CORONARY ARTERY BYPASS GRAFTING (CABG) times 2 using left  Internal mammary artery to LAD, left greater saphenous vein - open harvest.;  Surgeon: [name], MD;  Location: [facility];  Service: Open Heart Surgery;  Laterality: N/A;  . IR IMAGING GUIDED PORT INSERTION  12/08/2018  . LEFT HEART CATH AND CORONARY ANGIOGRAPHY N/A 09/27/2018   Procedure: LEFT HEART CATH AND CORONARY ANGIOGRAPHY;  Surgeon: Troy Sine, MD;  Location: [facility];  Service: Cardiovascular;  Laterality: N/A;  . LEFT HEART CATHETERIZATION WITH CORONARY ANGIOGRAM N/A 10/16/2014   Procedure: LEFT HEART CATHETERIZATION WITH CORONARY ANGIOGRAM;  Surgeon: Blane Ohara, MD;  Location[facility] CATH LAB;  Service:  Cardiovascular;  Laterality: N/A;  . Left L4-5 transforaminal lumbar interbody fusion with Depuy cage, rods and screws, local and allograft bone graft, Vivigen; bilateral decompression/partial hemilaminectomy lumbar five-sacral one  07/2017  . PERCUTANEOUS CORONARY ROTOBLATOR INTERVENTION (PCI-R) N/A 10/17/2014   Procedure: PERCUTANEOUS CORONARY ROTOBLATOR INTERVENTION (PCI-R);  Surgeon: Troy Sine, MD;  Location: [facility] CATH LAB;  Service: Cardiovascular;  Laterality: N/A;  . TEE WITHOUT CARDIOVERSION N/A 10/04/2018   Procedure: TRANSESOPHAGEAL ECHOCARDIOGRAM (TEE);  Surgeon: [name], MD;  Location: [facility];  Service: Open Heart Surgery;  Laterality: N/A;  . VEIN LIGATION AND STRIPPING Left   . VIDEO BRONCHOSCOPY Bilateral 02/05/2015   Procedure: VIDEO BRONCHOSCOPY WITHOUT FLUORO;  Surgeon: Tanda Rockers, MD;  Location: [facility] ENDOS[facility];  Service: Endoscopy;  Laterality: Bilateral;  . VULVA /PERINEUM BIOPSY  12-30-10   --epidermoid cyst    I have reviewed the social history and family history with the patient and they are unchanged from previous note.  ALLERGIES:  is allergic to fish-derived products, penicillins, aspirin, brilinta [ticagrelor], codeine phosphate, and diphenhydramine hcl.  MEDICATIONS:  Current Outpatient Medications  Medication Sig Dispense Refill  . albuterol (PROAIR HFA) 108 (90 Base) MCG/ACT inhaler INHALE 1 TO 2 PUFFS BY MOUTH EVERY 4 HOURS AS NEEDED FOR WHEEZING 8.5 g 3  . atorvastatin (LIPITOR) 40 MG tablet Take 1 tablet (40 mg total) by mouth daily. 90 tablet 3  . bisoprolol (ZEBETA) 10 MG tablet TAKE 1 TABLET(10 MG) BY MOUTH DAILY 90 tablet 2  . budesonide-formoterol (SYMBICORT)  160-4.5 MCG/ACT inhaler INHALE 2 PUFFS BY MOUTH EVERY 12 HOURS 30.6 g 3  . capecitabine (XELODA) 500 MG tablet Take 2 tablets in the am and 1 tablet in the pm.  7 days on 7 days off 42 tablet 0  . cetirizine (ZYRTEC) 10 MG tablet Take 10 mg by mouth at bedtime as needed for  allergies.    . cholecalciferol 2000 units TABS Take 1 tablet (2,000 Units total) by mouth daily. 30 tablet 3  . clopidogrel (PLAVIX) 75 MG tablet TAKE 1 TABLET(75 MG) BY MOUTH DAILY 90 tablet 3  . dextromethorphan-guaiFENesin (MUCINEX DM) 30-600 MG per 12 hr tablet Take 1-2 tablets by mouth every 12 (twelve) hours as needed for cough (with flutter).     . famotidine (PEPCID) 20 MG tablet Take 20 mg by mouth as needed.     . ferrous sulfate 325 (65 FE) MG tablet Take 650 mg by mouth daily with breakfast.     . fluticasone (FLONASE) 50 MCG/ACT nasal spray Place 1-2 sprays into both nostrils 2 (two) times daily as needed for allergies or rhinitis.    Marland Kitchen lidocaine-prilocaine (EMLA) cream APPLY EXTERNALLY TO THE AFFECTED AREA ONCE DAILY AS DIRECTED 30 g 3  . losartan (COZAAR) 25 MG tablet Take 25 mg by mouth daily.    . magic mouthwash SOLN Take 5 mLs by mouth 4 (four) times daily. Leave Benadryl out of compound since pt allergic to it. Use hydrocortisone & nystatin. Swish & swallow or spit for thrush 240 mL 1  . MAGNESIUM-OXIDE 400 (241.3 Mg) MG tablet TAKE 1 TABLET(400 MG) BY MOUTH THREE TIMES DAILY 90 tablet 2  . meclizine (ANTIVERT) 25 MG tablet TAKE 2 TABLETS BY MOUTH THREE TIMES DAILY AS NEEDED 24 tablet 2  . megestrol (MEGACE ES) 625 MG/5ML suspension Take 5 mLs (625 mg total) by mouth daily. 150 mL 0  . montelukast (SINGULAIR) 10 MG tablet TAKE 1 TABLET(10 MG) BY MOUTH AT BEDTIME 90 tablet 1  . Multiple Vitamin (MULTIVITAMIN) capsule Take 1 capsule by mouth daily.     . ondansetron (ZOFRAN) 8 MG tablet Take 1 tablet (8 mg total) by mouth every 8 (eight) hours as needed for nausea or vomiting. 30 tablet 1  . oxyCODONE (OXY IR/ROXICODONE) 5 MG immediate release tablet Take 1 tablet (5 mg total) by mouth every 8 (eight) hours as needed for severe pain. 45 tablet 0  . sacubitril-valsartan (ENTRESTO) 24-26 MG Take 1 tablet by mouth 2 (two) times daily. 180 tablet 3   No current facility-administered  medications for this visit.   Facility-Administered Medications Ordered in Other Visits  Medication Dose Route Frequency Provider Last Rate Last Admin  . dexamethasone (DECADRON) 10 mg in sodium chloride 0.9 % 50 mL IVPB  10 mg Intravenous Once Truitt Merle, MD      . dextrose 5 % solution   Intravenous Once Truitt Merle, MD      . heparin lock flush 100 unit/mL  500 Units Intracatheter Once PRN Truitt Merle, MD      . oxaliplatin (ELOXATIN) 60 mg in dextrose 5 % 500 mL chemo infusion  40 mg/m2 (Treatment Plan Recorded) Intravenous Once Truitt Merle, MD      . palonosetron Leona Carry) injection 0.25 mg  0.25 mg Intravenous Once Truitt Merle, MD      . sodium chloride flush (NS) 0.9 % injection 10 mL  10 mL Intracatheter PRN Truitt Merle, MD        PHYSICAL EXAMINATION: ECOG PERFORMANCE  STATUS: 1 - Symptomatic but completely ambulatory  Vitals:   07/17/20 0917  BP: (!) 139/73  Pulse: 66  Resp: 18  Temp: 98 F (36.7 C)  SpO2: 99%   Filed Weights   07/17/20 0917  Weight: 117 lb (53.1 kg)    GENERAL:alert, no distress and comfortable SKIN: No rash to exposed skin.  Palms without erythema EYES: sclera clear LUNGS: clear with normal breathing effort HEART: regular rate & rhythm, no lower extremity edema ABDOMEN:abdomen soft, non-tender and normal bowel sounds.  Fullness in the right upper quadrant NEURO: alert & oriented x 3 with fluent speech, normal gait PAC without erythema  LABORATORY DATA:  I have reviewed the data as listed CBC Latest Ref Rng & Units 07/17/2020 07/03/2020 06/19/2020  WBC 4.0 - 10.5 K/uL 4.8 6.3 5.4  Hemoglobin 12.0 - 15.0 g/dL 11.7(L) 11.7(L) 11.3(L)  Hematocrit 36 - 46 % 35.3(L) 34.9(L) 34.0(L)  Platelets 150 - 400 K/uL 290 306 341     CMP Latest Ref Rng & Units 07/17/2020 07/03/2020 06/19/2020  Glucose 70 - 99 mg/dL 109(H) 93 90  BUN 8 - 23 mg/dL 10 10 10   Creatinine 0.44 - 1.00 mg/dL 1.00 0.93 0.98  Sodium 135 - 145 mmol/L 140 141 140  Potassium 3.5 - 5.1 mmol/L 4.1 4.4 4.5   Chloride 98 - 111 mmol/L 106 104 104  CO2 22 - 32 mmol/L 25 26 25   Calcium 8.9 - 10.3 mg/dL 10.3 10.1 9.2  Total Protein 6.5 - 8.1 g/dL 7.9 8.1 7.5  Total Bilirubin 0.3 - 1.2 mg/dL 0.7 0.6 0.5  Alkaline Phos 38 - 126 U/L 65 60 65  AST 15 - 41 U/L 33 33 41  ALT 0 - 44 U/L 14 15 23       RADIOGRAPHIC STUDIES: I have personally reviewed the radiological images as listed and agreed with the findings in the report. No results found.   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 -Started first line cisplatin and gemcitabine on 12/15/2018 q2 weeks due to advanced age and CHF; s/p 13 cycles on 11/30/19, she tolerated well with fatigue and G1 CIPN -Shewas onZometa q91monthstarting 01/26/19, [facility] -UnfortunatelyPET from 12/22/20showedsignificant progression of diffuse nodal metastasis including new left supraclavicular and periaortic LN, increased activity of gallbladder mass, and increased size and metabolic activity of left ischial lesion with extensive extraosseous component. Cisplatin and gemcitabine was discontinued -she completed palliative RT and left hip/leg pain resolved, now only residual stiffness -Although her tumor is HER2(+), her recent echo shows significant cardiomyopathy with EF 25-30%and grade III diastolic dysfunction, unfortunately she is not a candidate for herceptin/perjeta. She will f/u with cardiology. -She began Xeloda 1500 mg BID 1 week on/1 week off on 1/21, she tolerated poorly and developed n/v, weakness, and low po intake.She tolerated dose reduction much better.  -unfortunately she progressed in the gallbladder and abdominal nodal metastasis, and new posterior mediastinal nodal metastasis seen on PET 04/30/20 -She started dose-reduced CAPOX  every 2 weeks on 05/07/20, she tolerated cycle 1 poorly with n/v and fatigue, cycle 2 oxali was further dose reduced to 40 mg/m2. She takes Xeloda 1000 mg AM and 500 mg PM 1 week on/1 week off, S/p 5 cycles   2. Left leg pain  -PET on 12/22 showed significant increase in size and metabolic activity  of the lesion and extraosseous component  -She completedpalliative RT per Dr. Danley Danker 12/17/19 to 01/08/20 -pain and weakness resolved after radiation, has stable mild residual stiffness  3.Cucumber with cycle 11cisplatin/gemcitabine, mild intermittent tingling to fingertips -no functional difficulties,tuning fork exam is normal -Improved onoral B complex vitamin, continue monitoring -Cold sensitivity last 4 days, residual tingling in index fingers only, no functional difficulties  4. CAD, S/P CABGX2 on 09/27/2018, EF 40-45% -Currently on Plavix, lasix PRN.Switched PPI to pepcidwhen she started Xeloda -Echo 01/07/20 showed markedly reduced EF to 25-30% with severely decreased left ventricular function, Grade III diastolic dysfunction -Followed by Dr. Claiborne Billings who started ARB, continue f/u  5. Asthma, dyspnea -Currently on Symbicort, Singulair, and albuterol as needed -f/u with Dr. Melvyn Novas  -baseline exertional dyspnea remains stable  6.Goal of care discussion -The patient understands the goal of care is palliative. -she is full code for now  7. Mild Anemia -Secondary to chemoand her open heart surgery -takes oral iron few times per week due to constipation  -Stable   Disposition: Ms. Strader appears stable.  She has completed 5 cycles of Capox with Xeloda 1000 mg a.m./500 mg p.m. for 1 week on and 1 week off.  She tolerates treatment well with cold sensitivity and diarrhea on Xeloda, well managed with supportive care.  She is able to recover and function well.  CBC and CMP stable.  CA 19.9 was decreased on 06/19/2020, the level is pending from today.  Overall labs  are adequate to proceed with cycle 6 starting today.  She is being scheduled for restaging PET scan before next visit in 2 weeks.  All questions were answered. The patient knows to call the clinic with any problems, questions or concerns. No barriers to learning were detected.     Alla Feeling, NP 07/17/20

## 2020-07-17 ENCOUNTER — Other Ambulatory Visit: Payer: Self-pay

## 2020-07-17 ENCOUNTER — Telehealth: Payer: Self-pay

## 2020-07-17 ENCOUNTER — Inpatient Hospital Stay (HOSPITAL_BASED_OUTPATIENT_CLINIC_OR_DEPARTMENT_OTHER): Payer: Medicare Other | Admitting: Nurse Practitioner

## 2020-07-17 ENCOUNTER — Inpatient Hospital Stay: Payer: Medicare Other

## 2020-07-17 ENCOUNTER — Encounter: Payer: Self-pay | Admitting: Nurse Practitioner

## 2020-07-17 VITALS — BP 139/73 | HR 66 | Temp 98.0°F | Resp 18 | Ht 59.0 in | Wt 117.0 lb

## 2020-07-17 DIAGNOSIS — C23 Malignant neoplasm of gallbladder: Secondary | ICD-10-CM | POA: Diagnosis not present

## 2020-07-17 DIAGNOSIS — Z7189 Other specified counseling: Secondary | ICD-10-CM

## 2020-07-17 DIAGNOSIS — Z5111 Encounter for antineoplastic chemotherapy: Secondary | ICD-10-CM | POA: Diagnosis not present

## 2020-07-17 LAB — CBC WITH DIFFERENTIAL (CANCER CENTER ONLY)
Abs Immature Granulocytes: 0.02 10*3/uL (ref 0.00–0.07)
Basophils Absolute: 0 10*3/uL (ref 0.0–0.1)
Basophils Relative: 1 %
Eosinophils Absolute: 0.3 10*3/uL (ref 0.0–0.5)
Eosinophils Relative: 5 %
HCT: 35.3 % — ABNORMAL LOW (ref 36.0–46.0)
Hemoglobin: 11.7 g/dL — ABNORMAL LOW (ref 12.0–15.0)
Immature Granulocytes: 0 %
Lymphocytes Relative: 12 %
Lymphs Abs: 0.6 10*3/uL — ABNORMAL LOW (ref 0.7–4.0)
MCH: 34.5 pg — ABNORMAL HIGH (ref 26.0–34.0)
MCHC: 33.1 g/dL (ref 30.0–36.0)
MCV: 104.1 fL — ABNORMAL HIGH (ref 80.0–100.0)
Monocytes Absolute: 0.5 10*3/uL (ref 0.1–1.0)
Monocytes Relative: 10 %
Neutro Abs: 3.5 10*3/uL (ref 1.7–7.7)
Neutrophils Relative %: 72 %
Platelet Count: 290 10*3/uL (ref 150–400)
RBC: 3.39 MIL/uL — ABNORMAL LOW (ref 3.87–5.11)
RDW: 17.1 % — ABNORMAL HIGH (ref 11.5–15.5)
WBC Count: 4.8 10*3/uL (ref 4.0–10.5)
nRBC: 0 % (ref 0.0–0.2)

## 2020-07-17 LAB — CMP (CANCER CENTER ONLY)
ALT: 14 U/L (ref 0–44)
AST: 33 U/L (ref 15–41)
Albumin: 3.3 g/dL — ABNORMAL LOW (ref 3.5–5.0)
Alkaline Phosphatase: 65 U/L (ref 38–126)
Anion gap: 9 (ref 5–15)
BUN: 10 mg/dL (ref 8–23)
CO2: 25 mmol/L (ref 22–32)
Calcium: 10.3 mg/dL (ref 8.9–10.3)
Chloride: 106 mmol/L (ref 98–111)
Creatinine: 1 mg/dL (ref 0.44–1.00)
GFR, Est AFR Am: >60 mL/min (ref >60)
GFR, Estimated: 54 mL/min — ABNORMAL LOW (ref >60)
Glucose, Bld: 109 mg/dL — ABNORMAL HIGH (ref 70–99)
Potassium: 4.1 mmol/L (ref 3.5–5.1)
Sodium: 140 mmol/L (ref 135–145)
Total Bilirubin: 0.7 mg/dL (ref 0.3–1.2)
Total Protein: 7.9 g/dL (ref 6.5–8.1)

## 2020-07-17 LAB — MAGNESIUM: Magnesium: 2 mg/dL (ref 1.7–2.4)

## 2020-07-17 MED ORDER — SODIUM CHLORIDE 0.9% FLUSH
10.0000 mL | INTRAVENOUS | Status: DC | PRN
  Administered 2020-07-17: 10 mL
  Filled 2020-07-17: qty 10

## 2020-07-17 MED ORDER — SODIUM CHLORIDE 0.9% FLUSH
10.0000 mL | Freq: Once | INTRAVENOUS | Status: AC | PRN
  Administered 2020-07-17: 10 mL
  Filled 2020-07-17: qty 10

## 2020-07-17 MED ORDER — PALONOSETRON HCL INJECTION 0.25 MG/5ML
INTRAVENOUS | Status: AC
  Filled 2020-07-17: qty 5

## 2020-07-17 MED ORDER — SODIUM CHLORIDE 0.9 % IV SOLN
10.0000 mg | Freq: Once | INTRAVENOUS | Status: AC
  Administered 2020-07-17: 10 mg via INTRAVENOUS
  Filled 2020-07-17: qty 10

## 2020-07-17 MED ORDER — DEXTROSE 5 % IV SOLN
Freq: Once | INTRAVENOUS | Status: AC
  Filled 2020-07-17: qty 250

## 2020-07-17 MED ORDER — HEPARIN SOD (PORK) LOCK FLUSH 100 UNIT/ML IV SOLN
500.0000 [IU] | Freq: Once | INTRAVENOUS | Status: AC | PRN
  Administered 2020-07-17: 500 [IU]
  Filled 2020-07-17: qty 5

## 2020-07-17 MED ORDER — PALONOSETRON HCL INJECTION 0.25 MG/5ML
0.2500 mg | Freq: Once | INTRAVENOUS | Status: AC
  Administered 2020-07-17: 0.25 mg via INTRAVENOUS

## 2020-07-17 MED ORDER — OXALIPLATIN CHEMO INJECTION 100 MG/20ML
40.0000 mg/m2 | Freq: Once | INTRAVENOUS | Status: AC
  Administered 2020-07-17: 60 mg via INTRAVENOUS
  Filled 2020-07-17: qty 12

## 2020-07-17 NOTE — Patient Instructions (Signed)
Cancer Center Discharge Instructions for Patients Receiving Chemotherapy  Today you received the following chemotherapy agents: oxaliplatin.  To help prevent nausea and vomiting after your treatment, we encourage you to take your nausea medication as directed.   If you develop nausea and vomiting that is not controlled by your nausea medication, call the clinic.   BELOW ARE SYMPTOMS THAT SHOULD BE REPORTED IMMEDIATELY:  *FEVER GREATER THAN 100.5 F  *CHILLS WITH OR WITHOUT FEVER  NAUSEA AND VOMITING THAT IS NOT CONTROLLED WITH YOUR NAUSEA MEDICATION  *UNUSUAL SHORTNESS OF BREATH  *UNUSUAL BRUISING OR BLEEDING  TENDERNESS IN MOUTH AND THROAT WITH OR WITHOUT PRESENCE OF ULCERS  *URINARY PROBLEMS  *BOWEL PROBLEMS  UNUSUAL RASH Items with * indicate a potential emergency and should be followed up as soon as possible.  Feel free to call the clinic should you have any questions or concerns. The clinic phone number is [phone].  Please show the CHEMO ALERT CARD at check-in to the Emergency Department and triage nurse.   

## 2020-07-17 NOTE — Telephone Encounter (Signed)
Called([phone]) left message to attempt to get availability for PET scan prior to scheduling pet is authorized awaiting call back

## 2020-07-17 NOTE — Patient Instructions (Signed)

## 2020-07-17 NOTE — Telephone Encounter (Signed)
Spoke with pt while in infusion Pet scan scheduled for Thursday 07/24/20 at 1pm check in at 1245 PET scan instructions printed and given to pt she confirms understanding encouraged to call for any questions or concerns reminded if cancellation is need to please follow up and call 24 hrs prior

## 2020-07-18 ENCOUNTER — Telehealth: Payer: Self-pay | Admitting: Nurse Practitioner

## 2020-07-18 LAB — CANCER ANTIGEN 19-9: CA 19-9: 1479 U/mL — ABNORMAL HIGH (ref 0–35)

## 2020-07-18 NOTE — Telephone Encounter (Signed)
No 7/29 los.

## 2020-07-20 ENCOUNTER — Other Ambulatory Visit: Payer: Self-pay | Admitting: Hematology

## 2020-07-20 MED ORDER — CAPECITABINE 500 MG PO TABS: ORAL_TABLET | ORAL | 1 refills | Status: DC

## 2020-07-24 ENCOUNTER — Other Ambulatory Visit: Payer: Self-pay

## 2020-07-24 ENCOUNTER — Ambulatory Visit (HOSPITAL_COMMUNITY)
Admission: RE | Admit: 2020-07-24 | Discharge: 2020-07-24 | Disposition: A | Discharge status: Home / Self Care | Payer: Medicare Other | Source: Ambulatory Visit | Attending: Hematology | Admitting: Hematology

## 2020-07-24 DIAGNOSIS — I2584 Coronary atherosclerosis due to calcified coronary lesion: Secondary | ICD-10-CM | POA: Diagnosis not present

## 2020-07-24 DIAGNOSIS — C23 Malignant neoplasm of gallbladder: Secondary | ICD-10-CM | POA: Diagnosis present

## 2020-07-24 DIAGNOSIS — I2510 Atherosclerotic heart disease of native coronary artery without angina pectoris: Secondary | ICD-10-CM | POA: Insufficient documentation

## 2020-07-24 DIAGNOSIS — Z951 Presence of aortocoronary bypass graft: Secondary | ICD-10-CM | POA: Insufficient documentation

## 2020-07-24 LAB — GLUCOSE, CAPILLARY: Glucose-Capillary: 99 mg/dL (ref 70–99)

## 2020-07-24 MED ORDER — FLUDEOXYGLUCOSE F - 18 (FDG) INJECTION
5.8000 | Freq: Once | INTRAVENOUS | Status: AC | PRN
  Administered 2020-07-24: 5.8 via INTRAVENOUS

## 2020-07-28 NOTE — Progress Notes (Signed)
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 Lymph node, biopsy, mediastinal - LYMPH NODE WITH METASTATIC ADENOCARCINOMA. - SEE MICROSCOPIC DESCRIPTION 2. Plaque, coronary artery - CALCIFIED ATHEROSCLEROTIC PLAQUE.   10/14/2018 Miscellaneous   Foundation One:  MSI stable tumor mutation burden 3Muts/mb ERBB2 amplification CCNE1 amplification TP53 mutation(+) CDK6 amplification  HGF amplification    11/13/2018 Cancer Staging   Staging form: Gallbladder, AJCC 8th Edition - Clinical stage from 11/13/2018: Stage IVB (cTX, cN2, pM1) - Signed by Truitt Merle, MD on 12/15/2018   11/17/2018 Imaging   11/17/2018 CT CAP IMPRESSION: 1. 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 This is associated with extensive upper abdominal and retroperitoneal lymphadenopathy, as well as metastatic lymphadenopathy in the posterior mediastinum and left supraclavicular region. There is also a metastatic lesion to the left ischium. 2. 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 3. Aortic atherosclerosis, in addition to left main and 3 vessel coronary artery disease. Status post median sternotomy for CABG including LIMA to the LAD. 4. There are calcifications of the aortic valve. Echocardiographic correlation for evaluation  of potential valvular dysfunction may be warranted if clinically indicated.    11/21/2018 Initial Diagnosis   Gallbladder cancer ([place])   11/27/2018 Pathology Results   11/27/2018 CA19-9 immunohistochemical stain Per request, a CA19-9 immunohistochemical stain was performed at an outside institution revealing positive staining in the tumor cells.    12/15/2018 - 11/30/2019 Chemotherapy   First line chemo cisplatin and gemcitabine every 2 weeks on 12/15/18. Had 1 month chemo break in April, restarted on 04/20/19. Stopped 11/30/19 due to disease progression.    01/26/2019 -  Chemotherapy   Zometa q67month starting 01/26/19-11/02/19. Switched to monthly Xgeva injections on 04/03/20   02/20/2019 PET scan   Restaging PET scan:  IMPRESSION: 1. Positive response to therapy at all primary and metastatic sites. Persistent primary and metastatic lesions do have intense hypermetabolic activity albeit reduced. 2. Decrease in size and hypermetabolic activity of mass lesion in the gallbladder fundus. 3. Interval decrease in size and metabolic activity of periportal adenopathy. 4. Decrease in size and metabolic activity of periaortic lymph nodes. 5. Interval decrease in size and metabolic activity of destructive lesion in the LEFT inferior pubic ramus with new sclerosis. 6. No new or progressive disease.   05/29/2019 Imaging   CT CAP W Contrast  IMPRESSION: Known gallbladder adenocarcinoma, difficult to compare to recent PET, improved from prior CT.  Upper abdominal/retroperitoneal lymphadenopathy, slightly improved from prior PET.  Osseous metastasis involving the left inferior pubic ramus, grossly unchanged.  No evidence of metastatic disease in the chest.  Additional ancillary findings as above.   08/22/2019 Imaging   CT CAP W Contrast  IMPRESSION: 1. Stable soft tissue mass involving the gallbladder fundus. 2. Stable mild porta hepatis and retroperitoneal lymphadenopathy. 3. Stable  sclerotic bone metastases. 4. No new or progressive metastatic disease identified.  5. Stable 2.3 cm left thyroid lobe nodule.   11/14/2019 Imaging   CT Left Hip WO  contrast  IMPRESSION: 1. 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 Associated soft tissue components have progressed, but there is no evidence of pathologic fracture. 2. No acute osseous findings. 3. 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   12/11/2019 PET scan   IMPRESSION: 1. Significant progression of malignancy, with increased activity in the gallbladder mass; considerable increase in size and activity of porta hepatis, retroperitoneal, and upper pelvic adenopathy; significant increase in size and metabolic activity of the left ischial metastatic lesion with extensive extraosseous component; and new mildly hypermetabolic left supraclavicular and thoracic periaortic lymph nodes suspicious for malignant involvement. 2. Faintly increased activity in a left thyroid nodule which was not previously hypermetabolic. Given the relatively low-grade activity, this may be amenable to surveillance, but a significant minority of thyroid nodules with hypermetabolic activity can represent thyroid cancer. 3. Other imaging findings of potential clinical significance: Chronic paranasal sinusitis. Chronic right middle lobe atelectasis. Aortic Atherosclerosis (ICD10-I70.0). Coronary atherosclerosis with cardiomegaly.   12/17/2019 - 01/07/2020 Radiation Therapy   Palliative Radiation with Dr. Lisbeth Renshaw 12/17/19-01/07/20   01/10/2020 - 04/28/2020 Chemotherapy   Xeloda 1500 mg BID 1 week on/1 week off starting on 01/10/20, she tolerated poorly and developed n/v, weakness, and low po intake. She was instructed to dose reduce to 500 mg BID which she continued into C2. Increased to 1032m Am and 5026mPM on 02/12/20 and increased to 100075mID starting 02/26/20 starting with C4. Last dose taken 04/28/20 for this regimen     04/30/2020 PET scan   IMPRESSION: 1. Increase in size of FDG avid gallbladder tumor. There is also been mild increase in size of FDG avid abdominal nodal metastases. Additionally, there is a new left-sided posterior mediastinal paravertebral FDG avid nodal metastases. 2. Similar appearance of small FDG avid left supraclavicular and subcarinal nodal metastases. Mild decrease in size of large sclerotic metastasis with soft tissue component involving the left acetabulum. 3.  Aortic Atherosclerosis (ICD10-I70.0).   05/06/2020 -  Chemotherapy   Second-line CAPOX q2weeks with Xeloda 1000m2m the AM and 500mg79mthe PM 1 week on/1 week off on 05/06/20 and Oxaliplatin starting 05/07/20. Due to poor toleration with n/v/d, we dose reduced Oxaliplatin to 40mg/47mtarting with C2 on 05/22/20.    07/24/2020 PET scan   IMPRESSION: 1. Enlarging gallbladder mass invading the liver with interval slight increased FDG uptake. 2. Slightly improved adenopathy as detailed above. 3. No new sites of metastatic disease are identified in the chest or abdomen. 4. Stable appearing extensive sclerotic lesion involving the left acetabulum. No new sites of osseous disease.      CURRENT THERAPY:  -Zometa q3month41monthng 01/26/19-11/02/19. Switched to monthly Xgeva injectionson 04/03/20. -Second-line CAPOX q2weeks with Xeloda 1000mg in17m AM and 500mg in 8mPM 1 week on/1 week offon 05/06/20 and Oxaliplatin starting 05/07/20.Due to poor toleration with n/v/d, we dose reduced Oxaliplatin to 40mg/m2 s63ming with C2 on 05/22/20.  INTERVAL HISTORY:  Cassie JJENESSA GILLINGHAMor a follow up and treatment. She presents to the clinic alone. She notes she is doing well. She notes her taste and smell has decreased but slowly improves. She notes her mild neuropathy in her hands is stable and no effect on daily activities or effecting her sleep. She notes she has been taking Magnesium 4 tabs daily and has run out quicker.      REVIEW OF SYSTEMS:  Constitutional: Denies fevers, chills or abnormal weight loss Eyes: Denies blurriness of vision Ears, nose, mouth, throat, and face: Denies mucositis or sore throat Respiratory: Denies cough, dyspnea or wheezes Cardiovascular: Denies palpitation, chest discomfort or lower extremity swelling Gastrointestinal:  Denies nausea, heartburn or change in bowel habits Skin: Denies abnormal skin rashes Lymphatics: Denies new lymphadenopathy or easy bruising Neurological: (+) Mild neuropathy of her hands, stable  Behavioral/Psych: Mood is stable, no new changes  All other systems were reviewed with the patient and are negative.  MEDICAL HISTORY:  Past Medical History:  Diagnosis Date  . Allergic rhinitis   . Arthritis   . Asthma    xolair s 8/05 ?11/07; mastered hfa 12/20/08  . Benign positional vertigo   . CAD (coronary artery disease)    a. 09/2014 NSTEMI s/p LHC with sig 2V dz. dLAD diffusely diseased and not suitable for PCI. unsuccessful RCA PCI d/t heavy calcifications  . gallbladder ca dx'd 10/2018  . GERD (gastroesophageal reflux disease)   . Heart attack ([place])    09/2014  . Hyperlipidemia    <130 ldl pos fm hx, bp  . Hypertension   . Osteopenia    dexa 08/22/07 AP spine + 1.1, left femur -1.3, right femur -.8; dexa 10/06/09 +1.6, left femur =1.6, right femur -.  . PONV (postoperative nausea and vomiting)   . Ruptured disc, cervical   . 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 BREAST SURGERY  10-26-10   Rt. breast bx--for microcalcifications in rt. retroareolar region--dx was hyalinized fibroadenoma  . BUNIONECTOMY Bilateral   . CARDIAC CATHETERIZATION     2015  . CATARACT EXTRACTION Right 02/02/2018   Dr Satira Sark  . CATARACT EXTRACTION Left 03/30/2018  . [place] SURGERY  12/01  . CORONARY ARTERY BYPASS GRAFT N/A 10/04/2018   Procedure: CORONARY  ARTERY BYPASS GRAFTING (CABG) times 2 using left  Internal mammary artery to LAD, left greater saphenous vein - open harvest.;  Surgeon: [name], MD;  Location: [facility];  Service: Open Heart Surgery;  Laterality: N/A;  . IR IMAGING GUIDED PORT INSERTION  12/08/2018  . LEFT HEART CATH AND CORONARY ANGIOGRAPHY N/A 09/27/2018   Procedure: LEFT HEART CATH AND CORONARY ANGIOGRAPHY;  Surgeon: Troy Sine, MD;  Location: [facility];  Service: Cardiovascular;  Laterality: N/A;  . LEFT HEART CATHETERIZATION WITH CORONARY ANGIOGRAM N/A 10/16/2014   Procedure: LEFT HEART CATHETERIZATION WITH CORONARY ANGIOGRAM;  Surgeon: Blane Ohara, MD;  Location[facility] CATH LAB;  Service: Cardiovascular;  Laterality: N/A;  . Left L4-5 transforaminal lumbar interbody fusion with Depuy cage, rods and screws, local and allograft bone graft, Vivigen; bilateral decompression/partial hemilaminectomy lumbar five-sacral one  07/2017  . PERCUTANEOUS CORONARY ROTOBLATOR INTERVENTION (PCI-R) N/A 10/17/2014   Procedure: PERCUTANEOUS CORONARY ROTOBLATOR INTERVENTION (PCI-R);  Surgeon: Troy Sine, MD;  Location[facility] LAB;  Service: Cardiovascular;  Laterality: N/A;  . TEE WITHOUT CARDIOVERSION N/A 10/04/2018   Procedure: TRANSESOPHAGEAL ECHOCARDIOGRAM (TEE);  Surgeon: [name], MD;  Location: [facility];  Service: Open Heart Surgery;  Laterality: N/A;  . VEIN LIGATION AND STRIPPING Left   . VIDEO BRONCHOSCOPY Bilateral 02/05/2015   Procedure: VIDEO BRONCHOSCOPY WITHOUT FLUORO;  Surgeon: Tanda Rockers, MD;  Location: [facility]OS[facility];  Service: Endoscopy;  Laterality: Bilateral;  . VULVA /PERINEUM BIOPSY  12-30-10   --epidermoid cyst    I have reviewed the  social history and family history with the patient and they are unchanged from previous note.  ALLERGIES:  is allergic to fish-derived products, penicillins, aspirin, brilinta [ticagrelor], codeine phosphate, and diphenhydramine hcl.  MEDICATIONS:   Current Outpatient Medications  Medication Sig Dispense Refill  . albuterol (PROAIR HFA) 108 (90 Base) MCG/ACT inhaler INHALE 1 TO 2 PUFFS BY MOUTH EVERY 4 HOURS AS NEEDED FOR WHEEZING 8.5 g 3  . atorvastatin (LIPITOR) 40 MG tablet Take 1 tablet (40 mg total) by mouth daily. 90 tablet 3  . bisoprolol (ZEBETA) 10 MG tablet TAKE 1 TABLET(10 MG) BY MOUTH DAILY 90 tablet 2  . budesonide-formoterol (SYMBICORT) 160-4.5 MCG/ACT inhaler INHALE 2 PUFFS BY MOUTH EVERY 12 HOURS 30.6 g 3  . capecitabine (XELODA) 500 MG tablet Take 2 tablets in the am and 1 tablet in the pm.  7 days on 7 days off 42 tablet 1  . cetirizine (ZYRTEC) 10 MG tablet Take 10 mg by mouth at bedtime as needed for allergies.    . cholecalciferol 2000 units TABS Take 1 tablet (2,000 Units total) by mouth daily. 30 tablet 3  . clopidogrel (PLAVIX) 75 MG tablet TAKE 1 TABLET(75 MG) BY MOUTH DAILY 90 tablet 3  . dextromethorphan-guaiFENesin (MUCINEX DM) 30-600 MG per 12 hr tablet Take 1-2 tablets by mouth every 12 (twelve) hours as needed for cough (with flutter).     . famotidine (PEPCID) 20 MG tablet Take 20 mg by mouth as needed.     . ferrous sulfate 325 (65 FE) MG tablet Take 650 mg by mouth daily with breakfast.     . fluticasone (FLONASE) 50 MCG/ACT nasal spray Place 1-2 sprays into both nostrils 2 (two) times daily as needed for allergies or rhinitis.    Marland Kitchen lidocaine-prilocaine (EMLA) cream APPLY EXTERNALLY TO THE AFFECTED AREA ONCE DAILY AS DIRECTED 30 g 3  . losartan (COZAAR) 25 MG tablet Take 25 mg by mouth daily.    . magic mouthwash SOLN Take 5 mLs by mouth 4 (four) times daily. Leave Benadryl out of compound since pt allergic to it. Use hydrocortisone & nystatin. Swish & swallow or spit for thrush 240 mL 1  . MAGNESIUM-OXIDE 400 (241.3 Mg) MG tablet TAKE 1 TABLET(400 MG) BY MOUTH THREE TIMES DAILY 90 tablet 2  . meclizine (ANTIVERT) 25 MG tablet TAKE 2 TABLETS BY MOUTH THREE TIMES DAILY AS NEEDED 24 tablet 2  . megestrol  (MEGACE ES) 625 MG/5ML suspension Take 5 mLs (625 mg total) by mouth daily. 150 mL 0  . montelukast (SINGULAIR) 10 MG tablet TAKE 1 TABLET(10 MG) BY MOUTH AT BEDTIME 90 tablet 1  . Multiple Vitamin (MULTIVITAMIN) capsule Take 1 capsule by mouth daily.     . ondansetron (ZOFRAN) 8 MG tablet Take 1 tablet (8 mg total) by mouth every 8 (eight) hours as needed for nausea or vomiting. 30 tablet 1  . oxyCODONE (OXY IR/ROXICODONE) 5 MG immediate release tablet Take 1 tablet (5 mg total) by mouth every 8 (eight) hours as needed for severe pain. 45 tablet 0  . sacubitril-valsartan (ENTRESTO) 24-26 MG Take 1 tablet by mouth 2 (two) times daily. 180 tablet 3  . magnesium 30 MG tablet Take 1 tablet (30 mg total) by mouth 2 (two) times daily. 30 tablet 0   No current facility-administered medications for this visit.    PHYSICAL EXAMINATION: ECOG PERFORMANCE STATUS: 1 - Symptomatic but completely ambulatory  Vitals:   07/31/20 0926  BP: 135/74  Pulse: 62  Resp:  18  Temp: 98 F (36.7 C)  SpO2: 99%   Filed Weights   07/31/20 0926  Weight: 115 lb 12.8 oz (52.5 kg)    Due to COVID19 we will limit examination to appearance. Patient had no complaints.  GENERAL:alert, no distress and comfortable SKIN: skin color normal, no rashes or significant lesions EYES: normal, Conjunctiva are pink and non-injected, sclera clear  NEURO: alert & oriented x 3 with fluent speech   LABORATORY DATA:  I have reviewed the data as listed CBC Latest Ref Rng & Units 07/31/2020 07/17/2020 07/03/2020  WBC 4.0 - 10.5 K/uL 5.1 4.8 6.3  Hemoglobin 12.0 - 15.0 g/dL 11.7(L) 11.7(L) 11.7(L)  Hematocrit 36 - 46 % 35.0(L) 35.3(L) 34.9(L)  Platelets 150 - 400 K/uL 280 290 306     CMP Latest Ref Rng & Units 07/31/2020 07/17/2020 07/03/2020  Glucose 70 - 99 mg/dL 105(H) 109(H) 93  BUN 8 - 23 mg/dL _0 Creatinine 0.44 - 1.00 mg/dL 0.95 1.00 0.93  Sodium 135 - 145 mmol/L 138 140 141  Potassium 3.5 - 5.1 mmol/L 4.2 4.1 4.4    Chloride 98 - 111 mmol/L 103 106 104  CO2 22 - 32 mmol/L _1 Calcium 8.9 - 10.3 mg/dL 10.4(H) 10.3 10.1  Total Protein 6.5 - 8.1 g/dL 8.1 7.9 8.1  Total Bilirubin 0.3 - 1.2 mg/dL 0.8 0.7 0.6  Alkaline Phos 38 - 126 U/L 63 65 60  AST 15 - 41 U/L 33 33 33  ALT 0 - 44 U/L _2 RADIOGRAPHIC STUDIES: I have personally reviewed the radiological images as listed and agreed with the findings in the report. No results found.   ASSESSMENT & PLAN:  Cassie Campbell is a 79 y.o. 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  -She is also onZometa q71monthstarting 01/26/19. Switched to monthly Xgeva on 04/03/20 due to kidney function. -UnfortunatelyPET from 12/22/20showedsignificant progression of diffuse nodal metastasis, increased activity of gallbladder mass, and increased size and metabolic activity of left ischial lesion. Cisplatin and gemcitabine was discontinued.  -She began Xeloda 1500 mg BID 1 week on/1 week off on 1/21, she tolerated poorly and developed n/v, weakness, and low po intake.She was able to slowly titrate up from low dose 5052mBID to 10003mID by C4 on 02/26/20. -Unfortunately herPET from 5/12/21showedincreased size of gallbladder tumor and abdominal nodal metastasis. She does have newposterior mediastinalnodal metastasis. Overall scan showeddisease progression.  -Pt startedCAPOXevery 2 weeks.Due to her advanced age and limited social support,we proceeded with low-doseOxaliplatinat 71m38mstarting 5/19/21and Xeloda1000mg42mand 500mg 6m week on and 1 week offstarting on5/18/21.Due to poor toleration withN&V, Fatigue and indigestion, C2 Oxaliplatin will be reduced to  40mg/m49marting 05/22/20.  -I personally reviewed and discussed her PET from 07/24/20 with pt in person which showed Enlarging gallbladder mass invading the liver with interval slight increased FDG uptake and Slightly improved adenopathy. Otherwise there is no new sites of metastatic disease. this is mixed response, and I will call it stable disease and continue current treatment for now.  -She wondered if cholecystectomy could reduce her tumor burden. I discussed given diffuse lymph node involvement, cholecystomy would not be as helpful.  -She is tolerating lower dose  CAPOX with mild, stable neuropathy and taste change. She is able to maintain her weight.  -Labs reviewed, Hg 11.7, Mag 1.6, BG 105, Ca 10.4, albumin 3.3. Overall adequate to proceed with C7 Oxaliplatin today at same dose. Start C7 Xeloda today.  -Continue Xgeva injection today and monthly  -f/u in 6 weeks. We can see her every other treatment given she is stable.    2.Lefthippain -PET on 12/22 showed significant increase in size and metabolic activity of the lesion and extraosseous component.Ipreviouslydiscussed her pain is likely related to her cancer. -For her Pain she completedpalliative RT per Dr. Danley Danker 12/17/19 to 01/08/20 -S/p RT, pain has resolved now.   3. CAD, S/P CABGX2 on 09/27/2018, EF25-30% -Currently on Plavix.  -f/u with Dr. Claiborne Billings -recent ECHO showed worsening EF  4. Asthma, dyspnea -Currently on Symbicort, Singulair, and albuterol as needed -f/u with pulmonary, stable, no recent flare -She has recently beenlittle more dyspneic. She can use dexa after chemo, and as needed for worsening wheezing  5.Goal of care discussion -The patient understands the goal of care is palliative. -she is full code for now  6. Mild Anemia -Secondary to chemoand her recent open heart surgery -I discussed her anemia may contribute to her mild SOB. -She has not taken oral iron because it causes bloating. I  encouraged her to take prenatal vitamin. -Previously normal, but mildly low lately.Stable  7. Weight loss, Low appetite -Her weight was previously down but now stable. Appetite adequate lately.  -Ipreviouslyoffered her Dietician referral, she declined. -She has megace but hesitant to start due to risk of blood clots. She is willing to try if she feels she needs it. I encouraged her to remain active.  -Her weight is stable lately.   8. Elevated Cr  -Cr has been elevated since being on Xeloda.  -Lasix has been held  -I encouraged her to drink enough water to manage her heart function and her Cr level. -She notes at recent PCP visit she had A1c of 6.6 and mild protein in urine. She is not currently on DM medication. Will monitor.  9. Neuropathy  -Developed with cycle 11cisplatin/gemcitabine, mild intermittent tingling to fingertips -no functional difficulties,tuning fork exam is normal -Improved onoral B complex vitamin, continue monitoring -S/p C5 of CAPOX, she has cold sensitivity last 4 days, residual tingling in index fingers only, no functional difficulties   Plan -I called in Magnesium  -Labs reviewed and adequate to proceed with C7 Oxaliplatin at same dose -Continue with C7 Xeloda today, 1068m in the AM and 5058min the PM, 1 week on/1 week off -Proceed with Xgeva injection today, continue monthly  -Lab, flush, chemo Oxaliplatin in 2, 4, 6 and 8 weeks  -F/u in 4 and 8 weeks    No problem-specific Assessment & Plan notes found for this encounter.   No orders of the defined types were placed in this encounter.  All questions were answered. The patient knows to call the clinic with any problems, questions or concerns. No barriers to learning was detected. The total time spent in the appointment was 30 minutes.     YaTruitt MerleMD 07/31/2020   I, AmJoslyn Devonam acting as scribe for YaTruitt MerleMD.   I have reviewed the above documentation for accuracy  and completeness, and I agree with the above.

## 2020-07-29 MED FILL — CAPECITABINE 500 MG TABLET: 500 | 28 days supply | Qty: 42 | Fill #0

## 2020-07-30 ENCOUNTER — Other Ambulatory Visit: Payer: Self-pay | Admitting: Hematology

## 2020-07-31 ENCOUNTER — Inpatient Hospital Stay: Payer: Medicare Other

## 2020-07-31 ENCOUNTER — Inpatient Hospital Stay: Payer: Medicare Other | Attending: Obstetrics

## 2020-07-31 ENCOUNTER — Inpatient Hospital Stay (HOSPITAL_BASED_OUTPATIENT_CLINIC_OR_DEPARTMENT_OTHER): Payer: Medicare Other | Admitting: Hematology

## 2020-07-31 ENCOUNTER — Encounter: Payer: Self-pay | Admitting: Hematology

## 2020-07-31 ENCOUNTER — Other Ambulatory Visit: Payer: Self-pay

## 2020-07-31 VITALS — BP 135/74 | HR 62 | Temp 98.0°F | Resp 18 | Ht 59.0 in | Wt 115.8 lb

## 2020-07-31 DIAGNOSIS — G620 Drug-induced polyneuropathy: Secondary | ICD-10-CM | POA: Insufficient documentation

## 2020-07-31 DIAGNOSIS — C23 Malignant neoplasm of gallbladder: Secondary | ICD-10-CM

## 2020-07-31 DIAGNOSIS — M8580 Other specified disorders of bone density and structure, unspecified site: Secondary | ICD-10-CM | POA: Insufficient documentation

## 2020-07-31 DIAGNOSIS — C772 Secondary and unspecified malignant neoplasm of intra-abdominal lymph nodes: Secondary | ICD-10-CM | POA: Diagnosis not present

## 2020-07-31 DIAGNOSIS — C7951 Secondary malignant neoplasm of bone: Secondary | ICD-10-CM | POA: Insufficient documentation

## 2020-07-31 DIAGNOSIS — I2510 Atherosclerotic heart disease of native coronary artery without angina pectoris: Secondary | ICD-10-CM | POA: Diagnosis not present

## 2020-07-31 DIAGNOSIS — Z5111 Encounter for antineoplastic chemotherapy: Secondary | ICD-10-CM | POA: Insufficient documentation

## 2020-07-31 DIAGNOSIS — D6481 Anemia due to antineoplastic chemotherapy: Secondary | ICD-10-CM | POA: Diagnosis not present

## 2020-07-31 DIAGNOSIS — J45909 Unspecified asthma, uncomplicated: Secondary | ICD-10-CM | POA: Diagnosis not present

## 2020-07-31 DIAGNOSIS — Z7189 Other specified counseling: Secondary | ICD-10-CM

## 2020-07-31 LAB — CBC WITH DIFFERENTIAL (CANCER CENTER ONLY)
Abs Immature Granulocytes: 0.02 10*3/uL (ref 0.00–0.07)
Basophils Absolute: 0 10*3/uL (ref 0.0–0.1)
Basophils Relative: 1 %
Eosinophils Absolute: 0.2 10*3/uL (ref 0.0–0.5)
Eosinophils Relative: 4 %
HCT: 35 % — ABNORMAL LOW (ref 36.0–46.0)
Hemoglobin: 11.7 g/dL — ABNORMAL LOW (ref 12.0–15.0)
Immature Granulocytes: 0 %
Lymphocytes Relative: 13 %
Lymphs Abs: 0.6 10*3/uL — ABNORMAL LOW (ref 0.7–4.0)
MCH: 34.5 pg — ABNORMAL HIGH (ref 26.0–34.0)
MCHC: 33.4 g/dL (ref 30.0–36.0)
MCV: 103.2 fL — ABNORMAL HIGH (ref 80.0–100.0)
Monocytes Absolute: 0.6 10*3/uL (ref 0.1–1.0)
Monocytes Relative: 11 %
Neutro Abs: 3.7 10*3/uL (ref 1.7–7.7)
Neutrophils Relative %: 71 %
Platelet Count: 280 10*3/uL (ref 150–400)
RBC: 3.39 MIL/uL — ABNORMAL LOW (ref 3.87–5.11)
RDW: 17.5 % — ABNORMAL HIGH (ref 11.5–15.5)
WBC Count: 5.1 10*3/uL (ref 4.0–10.5)
nRBC: 0 % (ref 0.0–0.2)

## 2020-07-31 LAB — CMP (CANCER CENTER ONLY)
ALT: 15 U/L (ref 0–44)
AST: 33 U/L (ref 15–41)
Albumin: 3.3 g/dL — ABNORMAL LOW (ref 3.5–5.0)
Alkaline Phosphatase: 63 U/L (ref 38–126)
Anion gap: 11 (ref 5–15)
BUN: 10 mg/dL (ref 8–23)
CO2: 24 mmol/L (ref 22–32)
Calcium: 10.4 mg/dL — ABNORMAL HIGH (ref 8.9–10.3)
Chloride: 103 mmol/L (ref 98–111)
Creatinine: 0.95 mg/dL (ref 0.44–1.00)
GFR, Est AFR Am: >60 mL/min (ref >60)
GFR, Estimated: 58 mL/min — ABNORMAL LOW (ref >60)
Glucose, Bld: 105 mg/dL — ABNORMAL HIGH (ref 70–99)
Potassium: 4.2 mmol/L (ref 3.5–5.1)
Sodium: 138 mmol/L (ref 135–145)
Total Bilirubin: 0.8 mg/dL (ref 0.3–1.2)
Total Protein: 8.1 g/dL (ref 6.5–8.1)

## 2020-07-31 LAB — MAGNESIUM: Magnesium: 1.6 mg/dL — ABNORMAL LOW (ref 1.7–2.4)

## 2020-07-31 MED ORDER — SODIUM CHLORIDE 0.9% FLUSH
10.0000 mL | INTRAVENOUS | Status: DC | PRN
  Administered 2020-07-31: 10 mL
  Filled 2020-07-31: qty 10

## 2020-07-31 MED ORDER — SODIUM CHLORIDE 0.9% FLUSH
10.0000 mL | Freq: Once | INTRAVENOUS | Status: AC | PRN
  Administered 2020-07-31: 10 mL
  Filled 2020-07-31: qty 10

## 2020-07-31 MED ORDER — OXALIPLATIN CHEMO INJECTION 100 MG/20ML
40.0000 mg/m2 | Freq: Once | INTRAVENOUS | Status: AC
  Administered 2020-07-31: 60 mg via INTRAVENOUS
  Filled 2020-07-31: qty 12

## 2020-07-31 MED ORDER — DEXTROSE 5 % IV SOLN
Freq: Once | INTRAVENOUS | Status: AC
  Filled 2020-07-31: qty 250

## 2020-07-31 MED ORDER — PALONOSETRON HCL INJECTION 0.25 MG/5ML
INTRAVENOUS | Status: AC
  Filled 2020-07-31: qty 5

## 2020-07-31 MED ORDER — MAGNESIUM 30 MG PO TABS
30.0000 mg | ORAL_TABLET | Freq: Two times a day (BID) | ORAL | 0 refills | Status: AC
Start: 2020-07-31 — End: ?

## 2020-07-31 MED ORDER — DENOSUMAB 120 MG/1.7ML ~~LOC~~ SOLN
120.0000 mg | Freq: Once | SUBCUTANEOUS | Status: AC
  Administered 2020-07-31: 120 mg via SUBCUTANEOUS

## 2020-07-31 MED ORDER — HEPARIN SOD (PORK) LOCK FLUSH 100 UNIT/ML IV SOLN
500.0000 [IU] | Freq: Once | INTRAVENOUS | Status: AC | PRN
  Administered 2020-07-31: 500 [IU]
  Filled 2020-07-31: qty 5

## 2020-07-31 MED ORDER — SODIUM CHLORIDE 0.9 % IV SOLN
10.0000 mg | Freq: Once | INTRAVENOUS | Status: AC
  Administered 2020-07-31: 10 mg via INTRAVENOUS
  Filled 2020-07-31: qty 10

## 2020-07-31 MED ORDER — PALONOSETRON HCL INJECTION 0.25 MG/5ML
0.2500 mg | Freq: Once | INTRAVENOUS | Status: AC
  Administered 2020-07-31: 0.25 mg via INTRAVENOUS

## 2020-07-31 MED ORDER — DENOSUMAB 120 MG/1.7ML ~~LOC~~ SOLN
SUBCUTANEOUS | Status: AC
  Filled 2020-07-31: qty 1.7

## 2020-07-31 NOTE — Patient Instructions (Signed)
[facility] Discharge Instructions for Patients Receiving Chemotherapy  Today you received the following chemotherapy agents: oxaliplatin.  To help prevent nausea and vomiting after your treatment, we encourage you to take your nausea medication as directed.   If you develop nausea and vomiting that is not controlled by your nausea medication, call the clinic.   BELOW ARE SYMPTOMS THAT SHOULD BE REPORTED IMMEDIATELY:  *FEVER GREATER THAN 100.5 F  *CHILLS WITH OR WITHOUT FEVER  NAUSEA AND VOMITING THAT IS NOT CONTROLLED WITH YOUR NAUSEA MEDICATION  *UNUSUAL SHORTNESS OF BREATH  *UNUSUAL BRUISING OR BLEEDING  TENDERNESS IN MOUTH AND THROAT WITH OR WITHOUT PRESENCE OF ULCERS  *URINARY PROBLEMS  *BOWEL PROBLEMS  UNUSUAL RASH Items with * indicate a potential emergency and should be followed up as soon as possible.  Feel free to call the clinic should you have any questions or concerns. The clinic phone number is [phone].  Please show the CHEMO ALERT CARD at check-in to the Emergency Department and triage nurse.   

## 2020-07-31 NOTE — Patient Instructions (Signed)

## 2020-08-01 ENCOUNTER — Encounter: Payer: Self-pay | Admitting: Hematology

## 2020-08-01 ENCOUNTER — Telehealth: Payer: Self-pay | Admitting: Hematology

## 2020-08-01 NOTE — Telephone Encounter (Signed)
Scheduled per 8/12 los. Pt is aware of appt time and date. 

## 2020-08-01 NOTE — Progress Notes (Signed)
Patient called regarding assistance and payment plans.  Advised patient there is currently no assistance available for her diagnosis/treatment drugs. She asked about income limits for Medicaid. Provided her with the number to [place]. She states she is making payments on collection accounts each month and doing the best that she can. Encouraged patient to continue to do so if possible.  She has my card for any addtional financial questions or concerns.

## 2020-08-04 ENCOUNTER — Ambulatory Visit: Payer: Medicare Other | Admitting: Internal Medicine

## 2020-08-07 ENCOUNTER — Telehealth: Payer: Self-pay | Admitting: *Deleted

## 2020-08-07 NOTE — Telephone Encounter (Signed)
Received vm call from pt asking to skip next treatment 8/26.  She states if feasible, she would like to take a break.  Message routed to Dr Feng/Pod RN.

## 2020-08-09 ENCOUNTER — Other Ambulatory Visit: Payer: Self-pay | Admitting: Hematology

## 2020-08-11 ENCOUNTER — Telehealth: Payer: Self-pay

## 2020-08-11 NOTE — Telephone Encounter (Signed)
Cassie Campbell called stating she would like to skip treatment on 08/15/2020 and resume treatment on 9/9.  She states she is feeling fine.  She just wants a break.  Reviewed with Dr. Burr Medico.

## 2020-08-11 NOTE — Telephone Encounter (Signed)
Per dr. Burr Medico ok for Ms Wanner to resume treatment on 9/9/20021.  I spoke with Ms Limburg and she verbalized understanding.

## 2020-08-13 MED FILL — Dexamethasone Sodium Phosphate Inj 100 MG/10ML: INTRAMUSCULAR | Qty: 1 | Status: AC

## 2020-08-14 ENCOUNTER — Ambulatory Visit: Payer: Medicare Other | Admitting: Nurse Practitioner

## 2020-08-14 ENCOUNTER — Inpatient Hospital Stay: Payer: Medicare Other

## 2020-08-14 ENCOUNTER — Other Ambulatory Visit: Payer: Medicare Other

## 2020-08-20 ENCOUNTER — Encounter: Payer: Self-pay | Admitting: Cardiovascular Disease

## 2020-08-20 ENCOUNTER — Ambulatory Visit: Payer: Medicare Other | Admitting: Cardiovascular Disease

## 2020-08-20 ENCOUNTER — Other Ambulatory Visit: Payer: Self-pay

## 2020-08-20 VITALS — BP 119/68 | HR 72 | Ht 59.0 in | Wt 114.8 lb

## 2020-08-20 DIAGNOSIS — I5020 Unspecified systolic (congestive) heart failure: Secondary | ICD-10-CM | POA: Diagnosis not present

## 2020-08-20 DIAGNOSIS — I255 Ischemic cardiomyopathy: Secondary | ICD-10-CM

## 2020-08-20 DIAGNOSIS — Z951 Presence of aortocoronary bypass graft: Secondary | ICD-10-CM | POA: Diagnosis not present

## 2020-08-20 DIAGNOSIS — I2510 Atherosclerotic heart disease of native coronary artery without angina pectoris: Secondary | ICD-10-CM

## 2020-08-20 DIAGNOSIS — I519 Heart disease, unspecified: Secondary | ICD-10-CM

## 2020-08-20 DIAGNOSIS — E785 Hyperlipidemia, unspecified: Secondary | ICD-10-CM

## 2020-08-20 DIAGNOSIS — C23 Malignant neoplasm of gallbladder: Secondary | ICD-10-CM

## 2020-08-20 DIAGNOSIS — I5189 Other ill-defined heart diseases: Secondary | ICD-10-CM

## 2020-08-20 NOTE — Patient Instructions (Addendum)
Medication Instructions:  CONTINUE WITH CURRENT MEDICATIONS. NO CHANGES.  *If you need a refill on your cardiac medications before your next appointment, please call your pharmacy*   Testing/Procedures: IN 3 MONTHS Your physician has requested that you have an echocardiogram. Echocardiography is a painless test that uses sound waves to create images of your heart. It provides your doctor with information about the size and shape of your heart and how well your hearts chambers and valves are working. This procedure takes approximately one hour. There are no restrictions for this procedure.     Follow-Up: At [facility], you and your health needs are our priority.  As part of our continuing mission to provide you with exceptional heart care, we have created designated Provider Care Teams.  These Care Teams include your primary Cardiologist (physician) and Advanced Practice Providers (APPs -  Physician Assistants and Nurse Practitioners) who all work together to provide you with the care you need, when you need it.  We recommend signing up for the patient portal called "MyChart".  Sign up information is provided on this After Visit Summary.  MyChart is used to connect with patients for Virtual Visits (Telemedicine).  Patients are able to view lab/test results, encounter notes, upcoming appointments, etc.  Non-urgent messages can be sent to your provider as well.   To learn more about what you can do with MyChart, go to [email or web address].    Your next appointment:   3-4 month(s)  The format for your next appointment:   In Person  Provider:   Shelva Majestic, MD

## 2020-08-20 NOTE — Progress Notes (Signed)
Patient ID: Cassie Campbell, female   DOB: 01-03-1941, 79 y.o.   MRN: [id number]     HPI: Cassie Campbell is a 79 y.o. female who presents for a 4 month follow-up cardiology evaluation higher to initiating chemotherapy.  Cassie Campbell has a history of complex CAD and underwent catheterization by Dr. Fletcher Anon with class IV symptoms on 10/16/2014.  Cassie Campbell was found to have diffusely diseased LAD, which was not suitable for revascularization.  Cassie Campbell had ruled in for non-ST segment elevation myocardial infarction, felt to be due to a severely calcified almost subtotal RCA with severe diffuse calcified disease.  Initial attempt at PCI by Dr. Fletcher Anon was unsuccessful due to the severe calcification.  Cassie Campbell was brought back to the laboratory the following day, and I performed a very long attempt at high-speed rotational atherectomy.  Unfortunately, due to the severely stenosed calcified lesion above the acute margin the Roto floppy wire was never be advanced distal enough due to severe disease beyond this site to allow the burr to be inserted to reach the stenosis.  Multiple attempts were made to utilize wire transfer, but even the smallest catheter was never able to pass the stenosis to allow for the Roto floppy wire to be reinserted in exchange for a Fielder XT wire, which was able to cross the stenosis and advanced distally.  Consequently, the procedure was aborted.  Cassie Campbell was sent home on increased medication regimen with potential plans for possible follow-up evaluation depending upon symptom status.  Since hospital discharge, Cassie Campbell has not experienced any significant recurrent anginal chest pain.  During the hospitalization ranolazine was added initially at 500 mg twice a day to isosorbide mononitrate, amlodipine, and beta blocker therapy.  Cassie Campbell has been treated with aspirin and Brilinta for dual antiplatelet therapy. When I saw her for subsequent evaluation I further titrated her ranolazine to 1000 mg twice a day and further  titrated her Lopressor to 37.5 mg twice a day.  Her creatinine remained normal following her contrast load and Cassie Campbell was mildly anemic.    Cassie Campbell has a history of asthma and is on Symbicort 160-4.5 and Singulair.  Cassie Campbell  has a history of hyperlipidemia, currently on Lipitor 80 mg.  Cassie Campbell does have GERD for which he takes Pepcid as well as omeprazole.  Cassie Campbell developed some increased wheezing on Lopressor and was changed to bisoprolol 5 mg in place of metoprolol.  Cassie Campbell has seen Dr. Melvyn Novas for her severe chronic asthma and in the past.  Cassie Campbell also was found to have marked atopy with significant elevation of IgG E levels.  Cassie Campbell feels that her breathing has improved with the changed to bisoprolol from Lopressor.   In March 2016 Cassie Campbell had been remaining stable but  had experienced a 10-15 minute episode of chest pain during the evening which responded to nitroglycerin. When I saw her the following day Cassie Campbell had been taking isosorbide 90 mg in the morning and I added isosorbide 30 mg at bedtime to her medical regimen of Ranexa 1000 mg twice a day , amlodipine 10 mg daily, and bisoprolol 5 mg. Cassie Campbell denies recurrent chest pain symptomatology since the nocturnal dose of Imdur was added.  Cassie Campbell developed a rash and  was advised to stop both Brilinta and Ranexa.  Cassie Campbell was started on Plavix. Her rash ultimately resolved.    Her husband passed away in on 2015/12/21.  Cassie Campbell has been more active.  Cassie Campbell denies nocturnal symptoms.  Cassie Campbell was evaluated in the emergency room in  January 2017 with right shoulder discomfort.    Cassie Campbell underwent low back surgery in August 2018 by Dr. Louanne Skye. Cassie Campbell tolerated surgery well.  A preoperative nuclear study showed an EF of 70% with probable apical soft tissue attenuation.  There was no ischemia.  Last saw her in November 2018 and Cassie Campbell was doing well without chest pain or shortness of breath.  Although Cassie Campbell has a history of mild asthma.   Cassie Campbell was diagnosed with GERD.  Cassie Campbell underwent successful cataract surgery right eye in  February and left eye in April 2019 by Dr. Satira Sark.  Since I saw her in June 2019 Cassie Campbell was seen by Kerin Ransom on September 26, 2018 with a history suggestive of recurrent unstable angina.  Cassie Campbell was hospitalized and on September 27, 2018 repeat cardiac catheterization by me was performed.  This revealed severe coronary calcification involving the LAD circumflex and RCA.  There was smooth 20 to 25% distal left main narrowing.  The LAD had 40% proximal calcified stenosis.  The LAD was severely calcified with 95% stenosis in the region of the first diagonal and there was diffuse disease in the midsegment of 50% followed by 90% mid stenosis and 80% distal stenosis.  The circumflex was calcified without high-grade obstructive disease.  The RCA was calcified and was totally occluded just prior to the acute margin.  There was extensive left-to-right collateralization to the distal RCA via the left circulation.  There was low normal LV function with an EF of 50 to 55% with a small region of focal mid mild anterolateral and posterior basal inferior hypocontractility.  Due to the severe calcification CABG revascularization surgery was recommended.  Cassie Campbell underwent CABG surgery x2 with her LIMA to her LAD and SVG to the PDA.  However, during surgery Cassie Campbell was instantly found to have a mediastinal lymph node which was sent for pathology and unfortunately showed metastatic adenocarcinoma.  Cassie Campbell is now being followed by Dr. 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  PET and CT imaging of her chest abdomen and pelvis with contrast showed diffuse adenopathy from supraclavicular to the abdomen with a hypermetabolic mass in the gallbladder and large hypermetabolic bony lesion in the posterior left pelvis.    I saw her on December 06, 2018 in follow-up of her CABG revascularization and oncologic assessments.  Cassie Campbell underwent successful Port-A-Cath placement on December 20 and Plavix was held for this  procedure.  I recommended that Cassie Campbell undergo an echo Doppler study prior to initiating chemotherapy.  Her echo Doppler study was done on December 11, 2018 and showed an EF of 40 to 45% status post revascularization with residual akinesis of the mid apical anteroseptal, inferoseptal and apical wall.  Cassie Campbell had grade 1 diastolic dysfunction.  Tissue Doppler was consistent with high ventricular filling pressures.  Cassie Campbell had mildly increased PA pressure 38 mm and abnormal global longitudinal strain pattern.  On December 15, 2018 Cassie Campbell initiated her first course of chemotherapy.  Cassie Campbell had some mild nausea but otherwise felt well.  Cassie Campbell is feeling better.  Cassie Campbell is scheduled to have a follow-up oncology evaluation later this week.  From a cardiac perspective, Cassie Campbell denies any chest pain or shortness of breath.  Cassie Campbell is now able to walk from room to room without her previous dyspnea or chest discomfort.  Cassie Campbell feels improved.    I saw her in a telemedicine evaluation in April 2020.  Since her December evaluation Cassie Campbell had been undergoing successful  chemotherapy every 2 weeks with cis-platinum and gemcitabine.  Cassie Campbell has completed 6 cycles and is currently taking a month off with plans for reinstitution on May 1.  Her most recent PET scan was significantly encouraging and showed significant early benefit with reduction in tumor burden.   I  saw her in November 2020 at which time Cassie Campbell denied any recurrent chest pain.  Cassie Campbell was admitting to fatigue following her chemotherapy treatments.    Cassie Campbell has undergone reevaluation by Dr. Burr Medico her oncologist and her most recent PET scan from December 2020 showed significant progression of malignancy with increased activity and her gallbladder mass, increase in size and activity of the porta hepatis, retroperitoneal, and upper pelvic adenopathy.  There was also significant increase in size and metabolic activity of the left ischial metastatic lesion with extensive extraosseous component and new mildly  hypermetabolic left subclavicular and thoracic periaortic lymph nodes.  As result, Cassie Campbell is scheduled to initiate new therapy next week with Herceptin.  Dr. Burr Medico has requested I see the patient prior to initiation of new therapy and obtain an echo Doppler study.    Per Dr. Ernestina Penna request, I saw Cassie Campbell prior to initiating chemotherapy on January 04, 2020.  At that time, her blood pressure was stable on bisoprolol 10 mg in addition to furosemide 20 mg daily.  Cassie Campbell continues to be on Plavix 75 mg daily.  Cassie Campbell was not having any anginal symptoms.  Cassie Campbell appears euvolemic on exam.  Her asthma was well controlled.  Cassie Campbell denied any chest pain or shortness of breath at rest.  At times there was some mild shortness of breath with activity.  Her echo Doppler study on January 07, 2020 now showed significant decline in LV function with EF 25 to 30%, restrictive physiology with grade 3 diastolic dysfunction.  Cassie Campbell had initiated her chemotherapy with Xeloda 1 week on and 1 week off but following the first dose Cassie Campbell became sick with nausea and vomiting and it was felt that Cassie Campbell had taken too high a dose.  Her dose was subsequently reduced from 1500 mg twice a day to 500 mg bid which Cassie Campbell has tolerated.    Cassie Campbell was evaluated by me on January 18, 2020 in a follow-up telemedicine evaluation subsequent to her echo Doppler study revealing  reduced LV function.  During that evaluation Cassie Campbell was feeling well and did not have any anginal symptoms.  I discussed that it was possible that her reduced LV function may be contributed by her initial round of chemotherapy.  Cassie Campbell had continued to be on bisoprolol 10 mg daily in addition to furosemide 20 mg.  With her decline in LV function I suggested initiation of low-dose ARB therapy with losartan 25 mg with plans hopefully to transition to [facility] depending upon blood pressure response.  Cassie Campbell had become ill following her first dose of Xeloda when her symptoms resolved with dose adjustment to a  significantly lower dose.  Cassie Campbell was  evaluated by me on February 15, 2020.  Over the prior month Cassie Campbell had felt well.  Cassie Campbell tolerated losartan and denied any dizziness or chest pain.  Laboratory in February 9 showed a potassium of 4.3, creatinine 0.86.  Cassie Campbell was mildly anemic with a hemoglobin of 11.4 hematocrit 34.9.  During that evaluation recommended discontinuance of furosemide as well as losartan and provided her samples with Entresto 24/26 mg twice a day.  Cassie Campbell has felt well with therapy.  Cassie Campbell was scheduled to see Joslyn Hy in several  weeks to reassess Entresto and attempt further titration of blood pressure allow.  However blood pressure was too low to warrant increasing dose.  I last saw her in May 2021 at which time Cassie Campbell felt better since initiating Entresto.  Cassie Campbell denied any increasing shortness of breath.  The past year Cassie Campbell has lost approximately 20 pounds.  Cassie Campbell denied chest pain PND orthopnea.  Cassie Campbell is tolerating her chemotherapeutic regimen.  Cassie Campbell denies presyncope or syncope.    Over the past several months, Cassie Campbell has continued to see oncology was last evaluated by Silvio Clayman, NP on July 17, 2020.  Cassie Campbell has continued to feel well with reference to shortness of breath on Entresto.  Cassie Campbell denies any presyncope or syncope.  There is no recurrent anginal symptoms.  Cassie Campbell continues to be on bisoprolol 10 mg daily and Entresto 24/26 mg twice a day.  Cassie Campbell is on Plavix 75 mg.  Cassie Campbell continues to be on atorvastatin 40 mg daily.  Cassie Campbell presents for reevaluation.  Past Medical History:  Diagnosis Date  . Allergic rhinitis   . Arthritis   . Asthma    xolair s 8/05 ?11/07; mastered hfa 12/20/08  . Benign positional vertigo   . CAD (coronary artery disease)    a. 09/2014 NSTEMI s/p LHC with sig 2V dz. dLAD diffusely diseased and not suitable for PCI. unsuccessful RCA PCI d/t heavy calcifications  . gallbladder ca dx'd 10/2018  . GERD (gastroesophageal reflux disease)   . Heart attack ([place])    09/2014  .  Hyperlipidemia    <130 ldl pos fm hx, bp  . Hypertension   . Osteopenia    dexa 08/22/07 AP spine + 1.1, left femur -1.3, right femur -.8; dexa 10/06/09 +1.6, left femur =1.6, right femur -.  . PONV (postoperative nausea and vomiting)   . Ruptured disc, cervical   . 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 BREAST SURGERY  10-26-10   Rt. breast bx--for microcalcifications in rt. retroareolar region--dx was hyalinized fibroadenoma  . BUNIONECTOMY Bilateral   . CARDIAC CATHETERIZATION     2015  . CATARACT EXTRACTION Right 02/02/2018   Dr Satira Sark  . CATARACT EXTRACTION Left 03/30/2018  . [place] SURGERY  12/01  . CORONARY ARTERY BYPASS GRAFT N/A 10/04/2018   Procedure: CORONARY ARTERY BYPASS GRAFTING (CABG) times 2 using left  Internal mammary artery to LAD, left greater saphenous vein - open harvest.;  Surgeon: [name], MD;  Location: [facility];  Service: Open Heart Surgery;  Laterality: N/A;  . IR IMAGING GUIDED PORT INSERTION  12/08/2018  . LEFT HEART CATH AND CORONARY ANGIOGRAPHY N/A 09/27/2018   Procedure: LEFT HEART CATH AND CORONARY ANGIOGRAPHY;  Surgeon: Troy Sine, MD;  Location: [facility];  Service: Cardiovascular;  Laterality: N/A;  . LEFT HEART CATHETERIZATION WITH CORONARY ANGIOGRAM N/A 10/16/2014   Procedure: LEFT HEART CATHETERIZATION WITH CORONARY ANGIOGRAM;  Surgeon: Blane Ohara, MD;  Location: [facility] CATH LAB;  Service: Cardiovascular;  Laterality: N/A;  . Left L4-5 transforaminal lumbar interbody fusion with Depuy cage, rods and screws, local and allograft bone graft, Vivigen; bilateral decompression/partial hemilaminectomy lumbar five-sacral one  07/2017  . PERCUTANEOUS CORONARY ROTOBLATOR INTERVENTION (PCI-R) N/A 10/17/2014   Procedure: PERCUTANEOUS CORONARY ROTOBLATOR INTERVENTION (PCI-R);  Surgeon: Troy Sine, MD;  Location[facility] C[facility] LAB;  Service:  Cardiovascular;  Laterality: N/A;  . TEE WITHOUT CARDIOVERSION N/A 10/04/2018  Procedure: TRANSESOPHAGEAL ECHOCARDIOGRAM (TEE);  Surgeon: [name], MD;  Location: [facility];  Service: Open Heart Surgery;  Laterality: N/A;  . VEIN LIGATION AND STRIPPING Left   . VIDEO BRONCHOSCOPY Bilateral 02/05/2015   Procedure: VIDEO BRONCHOSCOPY WITHOUT FLUORO;  Surgeon: Tanda Rockers, MD;  Location: [facility];  Service: Endoscopy;  Laterality: Bilateral;  . VULVA /PERINEUM BIOPSY  12-30-10   --epidermoid cyst    Allergies  Allergen Reactions  . Fish-Derived Products Shortness Of Breath, Swelling and Rash  . Penicillins Shortness Of Breath, Swelling and Rash    PATIENT HAS HAD A PCN REACTION WITH IMMEDIATE RASH, FACIAL/TONGUE/THROAT SWELLING, SOB, OR LIGHTHEADEDNESS WITH HYPOTENSION:  #  #  #  YES  #  #  #   Has patient had a PCN reaction causing severe rash involving mucus membranes or skin necrosis: No PATIENT HAS HAD A PCN REACTION THAT REQUIRED HOSPITALIZATION:  #  #  #  YES  #  #  #   Has patient had a PCN reaction occurring within the last 10 years: No    . Aspirin Swelling and Rash    SWELLING REACTION UNSPECIFIED   . Brilinta [Ticagrelor] Rash    Started Brilinta 09/2014 - rash - unsure which it is directly related to  . Codeine Phosphate Nausea Only  . Diphenhydramine Hcl Rash    Current Outpatient Medications  Medication Sig Dispense Refill  . albuterol (PROAIR HFA) 108 (90 Base) MCG/ACT inhaler INHALE 1 TO 2 PUFFS BY MOUTH EVERY 4 HOURS AS NEEDED FOR WHEEZING 8.5 g 3  . atorvastatin (LIPITOR) 40 MG tablet Take 1 tablet (40 mg total) by mouth daily. 90 tablet 3  . bisoprolol (ZEBETA) 10 MG tablet TAKE 1 TABLET(10 MG) BY MOUTH DAILY 90 tablet 2  . budesonide-formoterol (SYMBICORT) 160-4.5 MCG/ACT inhaler INHALE 2 PUFFS BY MOUTH EVERY 12 HOURS 30.6 g 3  . capecitabine (XELODA) 500 MG tablet Take 2 tablets in the am and 1 tablet in the pm.  7 days on 7 days off 42 tablet 1  .  cetirizine (ZYRTEC) 10 MG tablet Take 10 mg by mouth at bedtime as needed for allergies.    . cholecalciferol 2000 units TABS Take 1 tablet (2,000 Units total) by mouth daily. 30 tablet 3  . clopidogrel (PLAVIX) 75 MG tablet TAKE 1 TABLET(75 MG) BY MOUTH DAILY 90 tablet 3  . dextromethorphan-guaiFENesin (MUCINEX DM) 30-600 MG per 12 hr tablet Take 1-2 tablets by mouth every 12 (twelve) hours as needed for cough (with flutter).     . famotidine (PEPCID) 20 MG tablet Take 20 mg by mouth as needed.     . ferrous sulfate 325 (65 FE) MG tablet Take 650 mg by mouth daily with breakfast.     . fluticasone (FLONASE) 50 MCG/ACT nasal spray Place 1-2 sprays into both nostrils 2 (two) times daily as needed for allergies or rhinitis.    Marland Kitchen lidocaine-prilocaine (EMLA) cream APPLY EXTERNALLY TO THE AFFECTED AREA ONCE DAILY AS DIRECTED 30 g 3  . magic mouthwash SOLN Take 5 mLs by mouth 4 (four) times daily. Leave Benadryl out of compound since pt allergic to it. Use hydrocortisone & nystatin. Swish & swallow or spit for thrush 240 mL 1  . magnesium 30 MG tablet Take 1 tablet (30 mg total) by mouth 2 (two) times daily. 30 tablet 0  . magnesium oxide (MAG-OX) 400 (241.3 Mg) MG tablet TAKE 1 TABLET(400 MG) BY MOUTH THREE TIMES DAILY 90 tablet 2  .  meclizine (ANTIVERT) 25 MG tablet TAKE 2 TABLETS BY MOUTH THREE TIMES DAILY AS NEEDED 24 tablet 2  . megestrol (MEGACE ES) 625 MG/5ML suspension Take 5 mLs (625 mg total) by mouth daily. 150 mL 0  . montelukast (SINGULAIR) 10 MG tablet TAKE 1 TABLET(10 MG) BY MOUTH AT BEDTIME 90 tablet 1  . Multiple Vitamin (MULTIVITAMIN) capsule Take 1 capsule by mouth daily.     . ondansetron (ZOFRAN) 8 MG tablet Take 1 tablet (8 mg total) by mouth every 8 (eight) hours as needed for nausea or vomiting. 30 tablet 1  . oxyCODONE (OXY IR/ROXICODONE) 5 MG immediate release tablet Take 1 tablet (5 mg total) by mouth every 8 (eight) hours as needed for severe pain. 45 tablet 0  .  sacubitril-valsartan (ENTRESTO) 24-26 MG Take 1 tablet by mouth 2 (two) times daily. 180 tablet 3   No current facility-administered medications for this visit.    Social History   Socioeconomic History  . Marital status: Widowed    Spouse name: Not on file  . Number of children: Not on file  . Years of education: Not on file  . Highest education level: Not on file  Occupational History  . Occupation: retired Tour manager  Tobacco Use  . Smoking status: Never Smoker  . Smokeless tobacco: Never Used  Vaping Use  . Vaping Use: Never used  Substance and Sexual Activity  . Alcohol use: No    Alcohol/week: 0.0 standard drinks  . Drug use: No  . Sexual activity: Not Currently    Partners: Male    Birth control/protection: Post-menopausal  Other Topics Concern  . Not on file  Social History Narrative  . Not on file   Social Determinants of Health   Financial Resource Strain:   . Difficulty of Paying Living Expenses: Not on file  Food Insecurity:   . Worried About Charity fundraiser in the Last Year: Not on file  . Ran Out of Food in the Last Year: Not on file  Transportation Needs:   . Lack of Transportation (Medical): Not on file  . Lack of Transportation (Non-Medical): Not on file  Physical Activity:   . Days of Exercise per Week: Not on file  . Minutes of Exercise per Session: Not on file  Stress:   . Feeling of Stress : Not on file  Social Connections:   . Frequency of Communication with Friends and Family: Not on file  . Frequency of Social Gatherings with Friends and Family: Not on file  . Attends Religious Services: Not on file  . Active Member of Clubs or Organizations: Not on file  . Attends Archivist Meetings: Not on file  . Marital Status: Not on file  Intimate Partner Violence:   . Fear of Current or Ex-Partner: Not on file  . Emotionally Abused: Not on file  . Physically Abused: Not on file  . Sexually Abused: Not on file     Family History  Problem Relation Age of Onset  . Diabetes Mother        AODM  . Stroke Mother   . Hypertension Mother   . Heart disease Father   . Diabetes Sister        AODM  . Hypertension Sister   . 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 Other comprehensive 14 point system review is negative   Physical Exam BP 119/68   Pulse 72   Ht $R'4\' 11"'qn$  (1.499 m)   Wt 114 lb 12.8 oz (52.1 kg)   LMP 12/21/1999   SpO2 100%   BMI 23.19 kg/m    Repeat blood pressure by me was 118/66  Wt Readings from Last 3 Encounters:  08/20/20 114 lb 12.8 oz (52.1 kg)  07/31/20 115 lb 12.8 oz (52.5 kg)  07/17/20 117 lb (53.1 kg)   General: Alert, oriented, no distress.  Thin with BMI 23.19 Skin: normal turgor, no rashes, warm and dry HEENT: Normocephalic, atraumatic. Pupils equal round and reactive to light; sclera anicteric; extraocular muscles intact;  Nose without nasal septal hypertrophy Mouth/Parynx benign; Mallinpatti scale 2 Neck: No  JVD, no carotid bruits; normal carotid upstroke Lungs: clear to ausculatation and percussion; no wheezing or rales Chest wall: without tenderness to palpitation Heart: PMI not displaced, RRR, s1 s2 normal, 1/6 systolic murmur, no diastolic murmur, no rubs, gallops, thrills, or heaves Abdomen: soft, nontender; no hepatosplenomehaly, BS+; abdominal aorta nontender and not dilated by palpation. Back: no CVA tenderness Pulses 2+ Musculoskeletal: full range of motion, normal strength, no joint deformities Extremities: no clubbing cyanosis or edema, Homan's sign negative  Neurologic: grossly nonfocal; Cranial nerves grossly wnl Psychologic: Normal mood and affect   ECG (independently read by me): NSR at 72; RBBB, precordial T wave inversion, QTc 440 msec  Apr 22, 2020 ECG (independently read by me): Normal sinus rhythm at 65 bpm.  T wave abnormality anterolaterally.  Normal intervals.  No ectopy.  January 04, 2020 ECG (independently read by me): Normal sinus rhythm at 65 bpm.  QS complex V1 V2 with T wave abnormality V1 through V5.  Normal intervals.  No ectopy  November 2020 ECG (independently read by me): Sinus rhythm at 89 bpm.  Nonspecific T wave abnormality.  Normal intervals.  No ectopy  January 2020 ECG (independently read by me): Normal sinus rhythm at 78 bpm.  Biatrial enlargement.  QS complex V1 through V3.  Anterolateral T wave abnormality.  December 2019 ECG (independently read by me): Sinus rhythm at 76 bpm.  QS complex anteroseptally.  Lateral T wave abnormality.    June 2019 ECG (independently read by me): Normal sinus rhythm 81 bpm.  Right bundle branch block with repolarization changes.  November 16, 2017 ECG (independently read by me): Normal sinus rhythm at 75 bpm with right bundle branch block.  Left axis deviation.  Q waves in 3 and aVF.  April 2018 ECG (independently read by me): Normal sinus rhythm at 74 bpm.  Right bundle-branch block with repolarization  changes.  October 2017 ECG (independently read by me): Normal sinus rhythm at 68 bpm.  The right bundle branch block with repolarization changes.  April 2017 ECG (independently read by me): Normal sinus rhythm at 70 bpm.  Right bundle-branch block with repolarization changes.  November 2016 ECG (independently read by me): Normal sinus rhythm at 70 bpm.  Right bundle-branch block with repolarization changes.  May 2016 ECG (independently read by me):  Normal sinus rhythm at 65 bpm. Right bundle branch block with repolarization changes.  March 2016 ECG (independently read by me): Normal sinus rhythm at 68 bpm.  No ectopy.  No signal been ST segment changes.  December 2015 ECG (independently read by me): Normal sinus rhythm at 77 bpm.  Normal intervals.  No significant ST changes.  November 2015 ECG (independently read by me):sinus rhythm at 88 bpm.  QTc interval 454 Cassie.  No significant ST-T changes.  LABS:  BMP Latest Ref Rng & Units 07/31/2020 07/17/2020 07/03/2020  Glucose 70 - 99 mg/dL 105(H) 109(H) 93  BUN 8 - 23 mg/dL $Remove'10 10 10  'PeCaMXa$ Creatinine 0.44 - 1.00 mg/dL 0.95 1.00 0.93  BUN/Creat Ratio 12 - 28 - - -  Sodium 135 - 145 mmol/L 138 140 141  Potassium 3.5 - 5.1 mmol/L 4.2 4.1 4.4  Chloride 98 - 111 mmol/L 103 106 104  CO2 22 - 32 mmol/L $RemoveB'24 25 26  'foBKdrZI$ Calcium 8.9 - 10.3 mg/dL 10.4(H) 10.3 10.1    Hepatic Function Latest Ref Rng & Units 07/31/2020 07/17/2020 07/03/2020  Total Protein 6.5 - 8.1 g/dL 8.1 7.9 8.1  Albumin 3.5 - 5.0 g/dL 3.3(L) 3.3(L) 3.4(L)  AST 15 - 41 U/L 33 33 33  ALT 0 - 44 U/L $Remo'15 14 15  'fQGGS$ Alk Phosphatase 38 - 126 U/L 63 65 60  Total Bilirubin 0.3 - 1.2 mg/dL 0.8 0.7 0.6  Bilirubin, Direct 0.0 - 0.3 mg/dL - - -    CBC Latest Ref Rng & Units 07/31/2020 07/17/2020 07/03/2020  WBC 4.0 - 10.5 K/uL 5.1 4.8 6.3  Hemoglobin 12.0 - 15.0 g/dL 11.7(L) 11.7(L) 11.7(L)  Hematocrit 36 - 46 % 35.0(L) 35.3(L) 34.9(L)  Platelets 150 - 400 K/uL 280 290 306   Lab Results  Component Value  Date   MCV 103.2 (H) 07/31/2020   MCV 104.1 (H) 07/17/2020   MCV 104.8 (H) 07/03/2020   Lab Results  Component Value Date   TSH 1.360 11/06/2019   Lab Results  Component Value Date   HGBA1C 5.9 (H) 10/04/2018    Lipid Panel     Component Value Date/Time   CHOL 201 (H) 11/06/2019 1010   TRIG 147 11/06/2019 1010   TRIG 85 10/05/2006 1022   HDL 52 11/06/2019 1010   CHOLHDL 3.9 11/06/2019 1010   CHOLHDL 3 03/09/2018 0909   VLDL 21.8 03/09/2018 0909   LDLCALC 123 (H) 11/06/2019 1010   RADIOLOGY: No results found.  IMPRESSION:  1. HFrEF (heart failure with reduced ejection fraction) ([place])   2. CAD in native artery   3. S/P CABG (coronary artery bypass graft)   4. Ischemic cardiomyopathy   5. Grade III diastolic dysfunction   6. Hyperlipidemia with target LDL less than 70   7. Metatatic gallbladder cancer [facility])     ASSESSMENT AND PLAN: Cassie Campbell is a 79 year-old African-American female who suffered a NSTEMI in October 2015 which was felt to be due to subtotal high-grade calcified RCA with severe diffuse distal disease beyond her subtotal stenosis.  Cassie Campbell also has a severely diffusely diseased mid distal LAD which was not amenable for revascularization.  Cassie Campbell was treated medically and did well until October 2019 when Cassie Campbell developed recurrent angina consistent with unstable symptoms necessitating repeat catheterization.  Cassie Campbell had severely calcified CAD and unsuccessful CABG revascularization surgery.  Cassie Campbell did well from a cardiac surgical standpoint but unfortunately was found to have mediastinal adenopathy and has been diagnosed with metastatic adenocarcinoma of gallbladder cancer etiology.  Her echo Doppler study prior to initiating chemotherapy showed an EF of 40 to 45% with akinesis of the mid apical anteroseptal, inferoseptal, and apical myocardium with anterior hypokinesis.  Cassie Campbell had grade 1 diastolic dysfunction and abnormal tissue Doppler consistent with high ventricular  filling pressures.  Systolic peak pressure was elevated at 38 mm.  Global strain was abnormal.  Cassie Campbell initiated chemotherapy on December 15, 2018  and tolerated her initial treatment fairly well. Cassie Campbell is followed by Dr. Burr Medico and subsequent imaging studies had initially shown a reduction of her tumor burden.  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  When I saw her prior to instituting therapy Cassie Campbell appeared euvolemic.  Her  echo in January 2021 showed an EF reduced at 25 to 30% with restrictive physiology, grade 3 diastolic dysfunction.  Cassie Campbell has been transitioned off ARB therapy and is now on Entresto 24/26 mg twice a day.  Her blood pressure continues to be somewhat low and for this reason Delene Loll has not been further titrated to 49/51 mg twice a day.  Heart rhythm is stable.  I have recommended that Cassie Campbell undergo a follow-up echo Doppler study in 3 months for reassessment following Entresto initiation.  Cassie Campbell continues to be on bisoprolol 10 mg.  On exam Cassie Campbell appears euvolemic and does not seem to have volume overload.  Renal function remains stable with a BUN of 10 and creatinine 0.95 on July 31, 2020.  I will see her in follow-up of her echo Doppler study and further recommendations will be made at that time.   Troy Sine, MD, [facility]  08/23/2020 3:29 PM

## 2020-08-23 ENCOUNTER — Encounter: Payer: Self-pay | Admitting: Cardiovascular Disease

## 2020-08-27 NOTE — Progress Notes (Signed)
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 Lymph node, biopsy, mediastinal - LYMPH NODE WITH METASTATIC ADENOCARCINOMA. - SEE MICROSCOPIC DESCRIPTION 2. Plaque, coronary artery - CALCIFIED ATHEROSCLEROTIC PLAQUE.   10/14/2018 Miscellaneous   Foundation One:  MSI stable tumor mutation burden 3Muts/mb ERBB2 amplification CCNE1 amplification TP53 mutation(+) CDK6 amplification  HGF amplification    11/13/2018 Cancer Staging   Staging form: Gallbladder, AJCC 8th Edition - Clinical stage from 11/13/2018: Stage IVB (cTX, cN2, pM1) - Signed by Truitt Merle, MD on 12/15/2018   11/17/2018 Imaging   11/17/2018 CT CAP IMPRESSION: 1. 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 This is associated with extensive upper abdominal and retroperitoneal lymphadenopathy, as well as metastatic lymphadenopathy in the posterior mediastinum and left supraclavicular region. There is also a metastatic lesion to the left ischium. 2. 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 3. Aortic atherosclerosis, in addition to left main and 3 vessel coronary artery disease. Status post median sternotomy for CABG including LIMA to the LAD. 4. There are calcifications of the aortic valve. Echocardiographic correlation for evaluation  of potential valvular dysfunction may be warranted if clinically indicated.    11/21/2018 Initial Diagnosis   Gallbladder cancer ([place])   11/27/2018 Pathology Results   11/27/2018 CA19-9 immunohistochemical stain Per request, a CA19-9 immunohistochemical stain was performed at an outside institution revealing positive staining in the tumor cells.    12/15/2018 - 11/30/2019 Chemotherapy   First line chemo cisplatin and gemcitabine every 2 weeks on 12/15/18. Had 1 month chemo break in April, restarted on 04/20/19. Stopped 11/30/19 due to disease progression.    01/26/2019 -  Chemotherapy   Zometa q86month starting 01/26/19-11/02/19. Switched to monthly Xgeva injections on 04/03/20   02/20/2019 PET scan   Restaging PET scan:  IMPRESSION: 1. Positive response to therapy at all primary and metastatic sites. Persistent primary and metastatic lesions do have intense hypermetabolic activity albeit reduced. 2. Decrease in size and hypermetabolic activity of mass lesion in the gallbladder fundus. 3. Interval decrease in size and metabolic activity of periportal adenopathy. 4. Decrease in size and metabolic activity of periaortic lymph nodes. 5. Interval decrease in size and metabolic activity of destructive lesion in the LEFT inferior pubic ramus with new sclerosis. 6. No new or progressive disease.   05/29/2019 Imaging   CT CAP W Contrast  IMPRESSION: Known gallbladder adenocarcinoma, difficult to compare to recent PET, improved from prior CT.  Upper abdominal/retroperitoneal lymphadenopathy, slightly improved from prior PET.  Osseous metastasis involving the left inferior pubic ramus, grossly unchanged.  No evidence of metastatic disease in the chest.  Additional ancillary findings as above.   08/22/2019 Imaging   CT CAP W Contrast  IMPRESSION: 1. Stable soft tissue mass involving the gallbladder fundus. 2. Stable mild porta hepatis and retroperitoneal lymphadenopathy. 3. Stable  sclerotic bone metastases. 4. No new or progressive metastatic disease identified.  5. Stable 2.3 cm left thyroid lobe nodule.   11/14/2019 Imaging   CT Left Hip WO  contrast  IMPRESSION: 1. 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 Associated soft tissue components have progressed, but there is no evidence of pathologic fracture. 2. No acute osseous findings. 3. 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   12/11/2019 PET scan   IMPRESSION: 1. Significant progression of malignancy, with increased activity in the gallbladder mass; considerable increase in size and activity of porta hepatis, retroperitoneal, and upper pelvic adenopathy; significant increase in size and metabolic activity of the left ischial metastatic lesion with extensive extraosseous component; and new mildly hypermetabolic left supraclavicular and thoracic periaortic lymph nodes suspicious for malignant involvement. 2. Faintly increased activity in a left thyroid nodule which was not previously hypermetabolic. Given the relatively low-grade activity, this may be amenable to surveillance, but a significant minority of thyroid nodules with hypermetabolic activity can represent thyroid cancer. 3. Other imaging findings of potential clinical significance: Chronic paranasal sinusitis. Chronic right middle lobe atelectasis. Aortic Atherosclerosis (ICD10-I70.0). Coronary atherosclerosis with cardiomegaly.   12/17/2019 - 01/07/2020 Radiation Therapy   Palliative Radiation with Dr. Lisbeth Renshaw 12/17/19-01/07/20   01/10/2020 - 04/28/2020 Chemotherapy   Xeloda 1500 mg BID 1 week on/1 week off starting on 01/10/20, she tolerated poorly and developed n/v, weakness, and low po intake. She was instructed to dose reduce to 500 mg BID which she continued into C2. Increased to 105m Am and 5080mPM on 02/12/20 and increased to 100068mID starting 02/26/20 starting with C4. Last dose taken 04/28/20 for this regimen     04/30/2020 PET scan   IMPRESSION: 1. Increase in size of FDG avid gallbladder tumor. There is also been mild increase in size of FDG avid abdominal nodal metastases. Additionally, there is a new left-sided posterior mediastinal paravertebral FDG avid nodal metastases. 2. Similar appearance of small FDG avid left supraclavicular and subcarinal nodal metastases. Mild decrease in size of large sclerotic metastasis with soft tissue component involving the left acetabulum. 3.  Aortic Atherosclerosis (ICD10-I70.0).   05/06/2020 -  Chemotherapy   Second-line CAPOX q2weeks with Xeloda 1000m5m the AM and 500mg26mthe PM 1 week on/1 week off on 05/06/20 and Oxaliplatin starting 05/07/20. Due to poor toleration with n/v/d, we dose reduced Oxaliplatin to 40mg/58mtarting with C2 on 05/22/20.    07/24/2020 PET scan   IMPRESSION: 1. Enlarging gallbladder mass invading the liver with interval slight increased FDG uptake. 2. Slightly improved adenopathy as detailed above. 3. No new sites of metastatic disease are identified in the chest or abdomen. 4. Stable appearing extensive sclerotic lesion involving the left acetabulum. No new sites of osseous disease.      CURRENT THERAPY:  -Zometa q3month16monthng 01/26/19-11/02/19. Switched to monthly Xgeva injectionson 04/03/20. -Second-line CAPOX q2weeks with Xeloda 1000mg in55m AM and 500mg in 32mPM 1 week on/1 week offon 05/06/20 and Oxaliplatin starting 05/07/20.Due to poor toleration with n/v/d, we dose reduced Oxaliplatin to 40mg/m2 s27ming with C2 on 05/22/20.  INTERVAL HISTORY:  Cassie JMARTINE BLEECKERor a follow up and treatment. She presents to the clinic alone.  She tolerated last cycle of chemotherapy overall well, and fatigue for 3 to 4 days.  Recovered well. She requested to postpone her chemo 2 weeks ago.  She had an episode of vertigo a week ago, resolved now  no pain, some stiffness in morning, she uses a crane  No cough or dyspea,  no leg edema  No weight loss, no neuropathy except left two fingers with mild intermittent numbness  ROS otherwise negative   MEDICAL HISTORY:  Past Medical History:  Diagnosis Date  . Allergic rhinitis   . Arthritis   . Asthma    xolair s 8/05 ?11/07; mastered hfa 12/20/08  . Benign positional vertigo   . CAD (coronary artery disease)    a. 09/2014 NSTEMI s/p LHC with sig 2V dz. dLAD diffusely diseased and not suitable for PCI. unsuccessful RCA PCI d/t heavy calcifications  . gallbladder ca dx'd 10/2018  . GERD (gastroesophageal reflux disease)   . Heart attack ([place])    09/2014  . Hyperlipidemia    <130 ldl pos fm hx, bp  . Hypertension   . Osteopenia    dexa 08/22/07 AP spine + 1.1, left femur -1.3, right femur -.8; dexa 10/06/09 +1.6, left femur =1.6, right femur -.  . PONV (postoperative nausea and vomiting)   . Ruptured disc, cervical   . 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 BREAST SURGERY  10-26-10   Rt. breast bx--for microcalcifications in rt. retroareolar region--dx was hyalinized fibroadenoma  . BUNIONECTOMY Bilateral   . CARDIAC CATHETERIZATION     2015  . CATARACT EXTRACTION Right 02/02/2018   Dr Satira Sark  . CATARACT EXTRACTION Left 03/30/2018  . [place] SURGERY  12/01  . CORONARY ARTERY BYPASS GRAFT N/A 10/04/2018   Procedure: CORONARY ARTERY BYPASS GRAFTING (CABG) times 2 using left  Internal mammary artery to LAD, left greater saphenous vein - open harvest.;  Surgeon: [name], MD;  Location: [place];  Service: Open Heart Surgery;  Laterality: N/A;  . IR IMAGING GUIDED PORT INSERTION  12/08/2018  . LEFT HEART CATH AND CORONARY ANGIOGRAPHY N/A 09/27/2018   Procedure: LEFT HEART CATH AND CORONARY ANGIOGRAPHY;  Surgeon: Troy Sine, MD;  Location: [facility];  Service: Cardiovascular;  Laterality: N/A;  . LEFT HEART CATHETERIZATION WITH  CORONARY ANGIOGRAM N/A 10/16/2014   Procedure: LEFT HEART CATHETERIZATION WITH CORONARY ANGIOGRAM;  Surgeon: Blane Ohara, MD;  [facility] C[facility] LAB;  Service: Cardiovascular;  Laterality: N/A;  . Left L4-5 transforaminal lumbar interbody fusion with Depuy cage, rods and screws, local and allograft bone graft, Vivigen; bilateral decompression/partial hemilaminectomy lumbar five-sacral one  07/2017  . PERCUTANEOUS CORONARY ROTOBLATOR INTERVENTION (PCI-R) N/A 10/17/2014   Procedure: PERCUTANEOUS CORONARY ROTOBLATOR INTERVENTION (PCI-R);  Surgeon: Troy Sine, MD;  Location: [facility] CATH LAB;  Service: Cardiovascular;  Laterality: N/A;  . TEE WITHOUT CARDIOVERSION N/A 10/04/2018   Procedure: TRANSESOPHAGEAL ECHOCARDIOGRAM (TEE);  Surgeon: [name], MD;  Location: [place];  Service: Open Heart Surgery;  Laterality: N/A;  . VEIN LIGATION AND STRIPPING Left   . VIDEO BRONCHOSCOPY Bilateral 02/05/2015   Procedure: VIDEO BRONCHOSCOPY WITHOUT FLUORO;  Surgeon: Tanda Rockers, MD;  Location: [facility];  Service: Endoscopy;  Laterality: Bilateral;  . VULVA /PERINEUM BIOPSY  12-30-10   --epidermoid cyst    I have reviewed the social history and family history with the patient and they are unchanged from previous note.  ALLERGIES:  is allergic to fish-derived products, penicillins, aspirin, brilinta [ticagrelor], codeine phosphate, and diphenhydramine hcl.  MEDICATIONS:  Current Outpatient Medications  Medication Sig Dispense Refill  . albuterol (PROAIR HFA) 108 (90 Base) MCG/ACT inhaler INHALE 1 TO 2 PUFFS BY MOUTH EVERY 4 HOURS AS NEEDED FOR WHEEZING 8.5 g 3  .  atorvastatin (LIPITOR) 40 MG tablet Take 1 tablet (40 mg total) by mouth daily. 90 tablet 3  . bisoprolol (ZEBETA) 10 MG tablet TAKE 1 TABLET(10 MG) BY MOUTH DAILY 90 tablet 2  . budesonide-formoterol (SYMBICORT) 160-4.5 MCG/ACT inhaler INHALE 2 PUFFS BY MOUTH EVERY 12 HOURS 30.6 g 3  . capecitabine (XELODA) 500 MG tablet Take 2  tablets in the am and 1 tablet in the pm.  7 days on 7 days off 42 tablet 1  . cetirizine (ZYRTEC) 10 MG tablet Take 10 mg by mouth at bedtime as needed for allergies.    . cholecalciferol 2000 units TABS Take 1 tablet (2,000 Units total) by mouth daily. 30 tablet 3  . clopidogrel (PLAVIX) 75 MG tablet TAKE 1 TABLET(75 MG) BY MOUTH DAILY 90 tablet 3  . dextromethorphan-guaiFENesin (MUCINEX DM) 30-600 MG per 12 hr tablet Take 1-2 tablets by mouth every 12 (twelve) hours as needed for cough (with flutter).     . famotidine (PEPCID) 20 MG tablet Take 20 mg by mouth as needed.     . ferrous sulfate 325 (65 FE) MG tablet Take 650 mg by mouth daily with breakfast.     . fluticasone (FLONASE) 50 MCG/ACT nasal spray Place 1-2 sprays into both nostrils 2 (two) times daily as needed for allergies or rhinitis.    Marland Kitchen lidocaine-prilocaine (EMLA) cream APPLY EXTERNALLY TO THE AFFECTED AREA ONCE DAILY AS DIRECTED 30 g 3  . magic mouthwash SOLN Take 5 mLs by mouth 4 (four) times daily. Leave Benadryl out of compound since pt allergic to it. Use hydrocortisone & nystatin. Swish & swallow or spit for thrush 240 mL 1  . magnesium 30 MG tablet Take 1 tablet (30 mg total) by mouth 2 (two) times daily. 30 tablet 0  . magnesium oxide (MAG-OX) 400 (241.3 Mg) MG tablet TAKE 1 TABLET(400 MG) BY MOUTH THREE TIMES DAILY 90 tablet 2  . meclizine (ANTIVERT) 25 MG tablet TAKE 2 TABLETS BY MOUTH THREE TIMES DAILY AS NEEDED 24 tablet 2  . megestrol (MEGACE ES) 625 MG/5ML suspension Take 5 mLs (625 mg total) by mouth daily. 150 mL 0  . montelukast (SINGULAIR) 10 MG tablet TAKE 1 TABLET(10 MG) BY MOUTH AT BEDTIME 90 tablet 1  . Multiple Vitamin (MULTIVITAMIN) capsule Take 1 capsule by mouth daily.     . ondansetron (ZOFRAN) 8 MG tablet Take 1 tablet (8 mg total) by mouth every 8 (eight) hours as needed for nausea or vomiting. 30 tablet 1  . oxyCODONE (OXY IR/ROXICODONE) 5 MG immediate release tablet Take 1 tablet (5 mg total) by mouth  every 8 (eight) hours as needed for severe pain. 45 tablet 0  . sacubitril-valsartan (ENTRESTO) 24-26 MG Take 1 tablet by mouth 2 (two) times daily. 180 tablet 3   No current facility-administered medications for this visit.    PHYSICAL EXAMINATION: ECOG PERFORMANCE STATUS: 2 - Symptomatic, <50% confined to bed  Vitals:   08/28/20 1105  BP: 136/75  Pulse: 72  Resp: 18  Temp: (!) 96.7 F (35.9 C)  SpO2: 100%   Filed Weights   08/28/20 1105  Weight: 113 lb 14.4 oz (51.7 kg)    GENERAL:alert, no distress and comfortable SKIN: skin color, texture, turgor are normal, no rashes or significant lesions EYES: normal, Conjunctiva are pink and non-injected, sclera clear NECK: supple, thyroid normal size, non-tender, without nodularity LYMPH:  no palpable lymphadenopathy in the cervical, axillary  LUNGS: clear to auscultation and percussion with normal breathing effort  HEART: regular rate & rhythm and no murmurs and no lower extremity edema ABDOMEN:abdomen soft, non-tender and normal bowel sounds Musculoskeletal:no cyanosis of digits and no clubbing  NEURO: alert & oriented x 3 with fluent speech, no focal motor/sensory deficits  LABORATORY DATA:  I have reviewed the data as listed CBC Latest Ref Rng & Units 08/28/2020 07/31/2020 07/17/2020  WBC 4.0 - 10.5 K/uL 5.6 5.1 4.8  Hemoglobin 12.0 - 15.0 g/dL 12.0 11.7(L) 11.7(L)  Hematocrit 36 - 46 % 35.9(L) 35.0(L) 35.3(L)  Platelets 150 - 400 K/uL 299 280 290     CMP Latest Ref Rng & Units 07/31/2020 07/17/2020 07/03/2020  Glucose 70 - 99 mg/dL 105(H) 109(H) 93  BUN 8 - 23 mg/dL _0 Creatinine 0.44 - 1.00 mg/dL 0.95 1.00 0.93  Sodium 135 - 145 mmol/L 138 140 141  Potassium 3.5 - 5.1 mmol/L 4.2 4.1 4.4  Chloride 98 - 111 mmol/L 103 106 104  CO2 22 - 32 mmol/L _1 Calcium 8.9 - 10.3 mg/dL 10.4(H) 10.3 10.1  Total Protein 6.5 - 8.1 g/dL 8.1 7.9 8.1  Total Bilirubin 0.3 - 1.2 mg/dL 0.8 0.7 0.6  Alkaline Phos 38 - 126 U/L 63 65  60  AST 15 - 41 U/L 33 33 33  ALT 0 - 44 U/L _2 RADIOGRAPHIC STUDIES: I have personally reviewed the radiological images as listed and agreed with the findings in the report. No results found.   ASSESSMENT & PLAN:  Cassie Campbell is a 79 y.o. 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  -She is also onZometa q36monthstarting 01/26/19. Switched to monthly Xgeva on 04/03/20 due to kidney function. -UnfortunatelyPET from 12/22/20showedsignificant progression of diffuse nodal metastasis, increased activity of gallbladder mass, and increased size and metabolic activity of left ischial lesion. Cisplatin and gemcitabine was discontinued.  -She began Xeloda 1500 mg BID 1 week on/1 week off on 1/21, she tolerated poorly and developed n/v, weakness, and low po intake.She was able to slowly titrate up from low dose 5030mBID to 100014mID by C4 on 02/26/20. -due to disease progression, she startedCAPOXevery 2 weeks with significant dose reduction.  She is tolerating well overall. -Lab reviewed, adequate for treatment, will proceed CAPOX today and continue every 2 weeks -Follow-up in 4 weeks, will order restaging scan on next visit  2.Lefthippain, resolved  -PET on 12/22 showed significant increase in size and metabolic activity of the lesion and extraosseous component.Ipreviouslydiscussed her pain is likely related to her cancer. -For her Pain she completedpalliative RT per Dr. MooDanley Danker/28/20 to 01/08/20 -S/p RT, pain has resolved now.  3. CAD, S/P CABGX2 on 09/27/2018, EF25-30% -Currently on Plavix.  -f/u with Dr. KelClaiborne Billingsecent ECHO showed worsening EF  4. Asthma, dyspnea -Currently on  Symbicort, Singulair, and albuterol as needed -f/u with pulmonary, stable, no recent flare -She has recently beenlittle more dyspneic. She can use dexa after chemo, and as needed for worsening wheezing  5.Goal of care discussion -The patient understands the goal of care is palliative. -she is full code for now  6. Mild Anemia -Secondary to chemoand her recent open heart surgery -I discussed her anemia may contribute to her mild SOB. -She has not  taken oral iron because it causes bloating. I encouraged her to take prenatal vitamin. -Previously normal, but mildly low lately.Stable  7. Neuropathy  -Developed with cycle 11cisplatin/gemcitabine, mild intermittent tingling to fingertips -Improved onoral B complex vitamin, continue monitoring  8. Recent vertigo episode -resolved, will monitor   Plan -Lab reviewed, adequate for treatment, will proceed CAPOX today at same dose every 2 weeks -Follow-up in 4 weeks, will order restaging scan next visit -continue Xgeva every months  No problem-specific Assessment & Plan notes found for this encounter.   No orders of the defined types were placed in this encounter.  All questions were answered. The patient knows to call the clinic with any problems, questions or concerns. No barriers to learning was detected. The total time spent in the appointment was 30 minutes.     Truitt Merle, MD 08/28/2020   I, Joslyn Devon, am acting as scribe for Truitt Merle, MD.   I have reviewed the above documentation for accuracy and completeness, and I agree with the above.

## 2020-08-28 ENCOUNTER — Inpatient Hospital Stay: Payer: Medicare Other | Attending: Obstetrics

## 2020-08-28 ENCOUNTER — Inpatient Hospital Stay: Payer: Medicare Other | Admitting: Hematology

## 2020-08-28 ENCOUNTER — Inpatient Hospital Stay: Payer: Medicare Other

## 2020-08-28 ENCOUNTER — Encounter: Payer: Self-pay | Admitting: Hematology

## 2020-08-28 ENCOUNTER — Other Ambulatory Visit: Payer: Self-pay

## 2020-08-28 VITALS — BP 136/75 | HR 72 | Temp 96.7°F | Resp 18 | Ht 59.0 in | Wt 113.9 lb

## 2020-08-28 DIAGNOSIS — C7951 Secondary malignant neoplasm of bone: Secondary | ICD-10-CM | POA: Insufficient documentation

## 2020-08-28 DIAGNOSIS — D6481 Anemia due to antineoplastic chemotherapy: Secondary | ICD-10-CM | POA: Diagnosis not present

## 2020-08-28 DIAGNOSIS — C778 Secondary and unspecified malignant neoplasm of lymph nodes of multiple regions: Secondary | ICD-10-CM | POA: Diagnosis not present

## 2020-08-28 DIAGNOSIS — C23 Malignant neoplasm of gallbladder: Secondary | ICD-10-CM

## 2020-08-28 DIAGNOSIS — Z5111 Encounter for antineoplastic chemotherapy: Secondary | ICD-10-CM | POA: Diagnosis not present

## 2020-08-28 DIAGNOSIS — Z7189 Other specified counseling: Secondary | ICD-10-CM

## 2020-08-28 LAB — CMP (CANCER CENTER ONLY)
ALT: 23 U/L (ref 0–44)
AST: 46 U/L — ABNORMAL HIGH (ref 15–41)
Albumin: 3.4 g/dL — ABNORMAL LOW (ref 3.5–5.0)
Alkaline Phosphatase: 101 U/L (ref 38–126)
Anion gap: 8 (ref 5–15)
BUN: 15 mg/dL (ref 8–23)
CO2: 26 mmol/L (ref 22–32)
Calcium: 10.1 mg/dL (ref 8.9–10.3)
Chloride: 105 mmol/L (ref 98–111)
Creatinine: 0.9 mg/dL (ref 0.44–1.00)
GFR, Est AFR Am: >60 mL/min (ref >60)
GFR, Estimated: >60 mL/min (ref >60)
Glucose, Bld: 96 mg/dL (ref 70–99)
Potassium: 4.4 mmol/L (ref 3.5–5.1)
Sodium: 139 mmol/L (ref 135–145)
Total Bilirubin: 0.6 mg/dL (ref 0.3–1.2)
Total Protein: 8.3 g/dL — ABNORMAL HIGH (ref 6.5–8.1)

## 2020-08-28 LAB — CBC WITH DIFFERENTIAL (CANCER CENTER ONLY)
Abs Immature Granulocytes: 0.03 10*3/uL (ref 0.00–0.07)
Basophils Absolute: 0 10*3/uL (ref 0.0–0.1)
Basophils Relative: 1 %
Eosinophils Absolute: 0.3 10*3/uL (ref 0.0–0.5)
Eosinophils Relative: 5 %
HCT: 35.9 % — ABNORMAL LOW (ref 36.0–46.0)
Hemoglobin: 12 g/dL (ref 12.0–15.0)
Immature Granulocytes: 1 %
Lymphocytes Relative: 13 %
Lymphs Abs: 0.7 10*3/uL (ref 0.7–4.0)
MCH: 33.9 pg (ref 26.0–34.0)
MCHC: 33.4 g/dL (ref 30.0–36.0)
MCV: 101.4 fL — ABNORMAL HIGH (ref 80.0–100.0)
Monocytes Absolute: 0.5 10*3/uL (ref 0.1–1.0)
Monocytes Relative: 9 %
Neutro Abs: 4 10*3/uL (ref 1.7–7.7)
Neutrophils Relative %: 71 %
Platelet Count: 299 10*3/uL (ref 150–400)
RBC: 3.54 MIL/uL — ABNORMAL LOW (ref 3.87–5.11)
RDW: 16.2 % — ABNORMAL HIGH (ref 11.5–15.5)
WBC Count: 5.6 10*3/uL (ref 4.0–10.5)
nRBC: 0 % (ref 0.0–0.2)

## 2020-08-28 LAB — MAGNESIUM: Magnesium: 1.9 mg/dL (ref 1.7–2.4)

## 2020-08-28 MED ORDER — PALONOSETRON HCL INJECTION 0.25 MG/5ML
0.2500 mg | Freq: Once | INTRAVENOUS | Status: AC
  Administered 2020-08-28: 0.25 mg via INTRAVENOUS

## 2020-08-28 MED ORDER — SODIUM CHLORIDE 0.9% FLUSH
10.0000 mL | INTRAVENOUS | Status: DC | PRN
  Filled 2020-08-28: qty 10

## 2020-08-28 MED ORDER — DENOSUMAB 120 MG/1.7ML ~~LOC~~ SOLN
SUBCUTANEOUS | Status: AC
  Filled 2020-08-28: qty 1.7

## 2020-08-28 MED ORDER — PALONOSETRON HCL INJECTION 0.25 MG/5ML
INTRAVENOUS | Status: AC
  Filled 2020-08-28: qty 5

## 2020-08-28 MED ORDER — DENOSUMAB 120 MG/1.7ML ~~LOC~~ SOLN
120.0000 mg | Freq: Once | SUBCUTANEOUS | Status: AC
  Administered 2020-08-28: 120 mg via SUBCUTANEOUS

## 2020-08-28 MED ORDER — HEPARIN SOD (PORK) LOCK FLUSH 100 UNIT/ML IV SOLN
500.0000 [IU] | Freq: Once | INTRAVENOUS | Status: DC | PRN
  Filled 2020-08-28: qty 5

## 2020-08-28 MED ORDER — SODIUM CHLORIDE 0.9 % IV SOLN
10.0000 mg | Freq: Once | INTRAVENOUS | Status: AC
  Administered 2020-08-28: 10 mg via INTRAVENOUS
  Filled 2020-08-28: qty 10

## 2020-08-28 MED ORDER — OXALIPLATIN CHEMO INJECTION 100 MG/20ML
40.0000 mg/m2 | Freq: Once | INTRAVENOUS | Status: AC
  Administered 2020-08-28: 60 mg via INTRAVENOUS
  Filled 2020-08-28: qty 12

## 2020-08-28 MED ORDER — SODIUM CHLORIDE 0.9% FLUSH
10.0000 mL | Freq: Once | INTRAVENOUS | Status: AC | PRN
  Administered 2020-08-28: 10 mL
  Filled 2020-08-28: qty 10

## 2020-08-28 MED ORDER — DEXTROSE 5 % IV SOLN
Freq: Once | INTRAVENOUS | Status: AC
  Filled 2020-08-28: qty 250

## 2020-08-28 NOTE — Patient Instructions (Addendum)
[facility] Discharge Instructions for Patients Receiving Chemotherapy  Today you received the following chemotherapy agents: Oxaliplatin.  To help prevent nausea and vomiting after your treatment, we encourage you to take your nausea medication as directed.   If you develop nausea and vomiting that is not controlled by your nausea medication, call the clinic.   BELOW ARE SYMPTOMS THAT SHOULD BE REPORTED IMMEDIATELY:  *FEVER GREATER THAN 100.5 F  *CHILLS WITH OR WITHOUT FEVER  NAUSEA AND VOMITING THAT IS NOT CONTROLLED WITH YOUR NAUSEA MEDICATION  *UNUSUAL SHORTNESS OF BREATH  *UNUSUAL BRUISING OR BLEEDING  TENDERNESS IN MOUTH AND THROAT WITH OR WITHOUT PRESENCE OF ULCERS  *URINARY PROBLEMS  *BOWEL PROBLEMS  UNUSUAL RASH Items with * indicate a potential emergency and should be followed up as soon as possible.  Feel free to call the clinic should you have any questions or concerns. The clinic phone number is [phone].  Please show the [facility] at check-in to the Emergency Department and triage nurse.  Denosumab injection What is this medicine? DENOSUMAB (den oh sue mab) slows bone breakdown. Prolia is used to treat osteoporosis in women after menopause and in men, and in people who are taking corticosteroids for 6 months or more. Delton See is used to treat a high calcium level due to cancer and to prevent bone fractures and other bone problems caused by multiple myeloma or cancer bone metastases. Delton See is also used to treat giant cell tumor of the bone. This medicine may be used for other purposes; ask your health care provider or pharmacist if you have questions. COMMON BRAND NAME(S): Prolia, XGEVA What should I tell my health care provider before I take this medicine? They need to know if you have any of these conditions:  dental disease  having surgery or tooth extraction  infection  kidney disease  low levels of calcium or Vitamin D in the  blood  malnutrition  on hemodialysis  skin conditions or sensitivity  thyroid or parathyroid disease  an unusual reaction to denosumab, other medicines, foods, dyes, or preservatives  pregnant or trying to get pregnant  breast-feeding How should I use this medicine? This medicine is for injection under the skin. It is given by a health care professional in a hospital or clinic setting. A special MedGuide will be given to you before each treatment. Be sure to read this information carefully each time. For Prolia, talk to your pediatrician regarding the use of this medicine in children. Special care may be needed. For Delton See, talk to your pediatrician regarding the use of this medicine in children. While this drug may be prescribed for children as young as 13 years for selected conditions, precautions do apply. Overdosage: If you think you have taken too much of this medicine contact a poison control center or emergency room at once. NOTE: This medicine is only for you. Do not share this medicine with others. What if I miss a dose? It is important not to miss your dose. Call your doctor or health care professional if you are unable to keep an appointment. What may interact with this medicine? Do not take this medicine with any of the following medications:  other medicines containing denosumab This medicine may also interact with the following medications:  medicines that lower your chance of fighting infection  steroid medicines like prednisone or cortisone This list may not describe all possible interactions. Give your health care provider a list of all the medicines, herbs, non-prescription  drugs, or dietary supplements you use. Also tell them if you smoke, drink alcohol, or use illegal drugs. Some items may interact with your medicine. What should I watch for while using this medicine? Visit your doctor or health care professional for regular checks on your progress. Your doctor or  health care professional may order blood tests and other tests to see how you are doing. Call your doctor or health care professional for advice if you get a fever, chills or sore throat, or other symptoms of a cold or flu. Do not treat yourself. This drug may decrease your body's ability to fight infection. Try to avoid being around people who are sick. You should make sure you get enough calcium and vitamin D while you are taking this medicine, unless your doctor tells you not to. Discuss the foods you eat and the vitamins you take with your health care professional. See your dentist regularly. Brush and floss your teeth as directed. Before you have any dental work done, tell your dentist you are receiving this medicine. Do not become pregnant while taking this medicine or for 5 months after stopping it. Talk with your doctor or health care professional about your birth control options while taking this medicine. Women should inform their doctor if they wish to become pregnant or think they might be pregnant. There is a potential for serious side effects to an unborn child. Talk to your health care professional or pharmacist for more information. What side effects may I notice from receiving this medicine? Side effects that you should report to your doctor or health care professional as soon as possible:  allergic reactions like skin rash, itching or hives, swelling of the face, lips, or tongue  bone pain  breathing problems  dizziness  jaw pain, especially after dental work  redness, blistering, peeling of the skin  signs and symptoms of infection like fever or chills; cough; sore throat; pain or trouble passing urine  signs of low calcium like fast heartbeat, muscle cramps or muscle pain; pain, tingling, numbness in the hands or feet; seizures  unusual bleeding or bruising  unusually weak or tired Side effects that usually do not require medical attention (report to your doctor or  health care professional if they continue or are bothersome):  constipation  diarrhea  headache  joint pain  loss of appetite  muscle pain  runny nose  tiredness  upset stomach This list may not describe all possible side effects. Call your doctor for medical advice about side effects. You may report side effects to FDA at 1-800-FDA-1088. Where should I keep my medicine? This medicine is only given in a clinic, doctor's office, or other health care setting and will not be stored at home. NOTE: This sheet is a summary. It may not cover all possible information. If you have questions about this medicine, talk to your doctor, pharmacist, or health care provider.  2020 [employer]vier/Gold Standard (2018-04-14 16:10:44)

## 2020-08-29 ENCOUNTER — Telehealth: Payer: Self-pay | Admitting: Hematology

## 2020-08-29 LAB — CANCER ANTIGEN 19-9: CA 19-9: 2851 U/mL — ABNORMAL HIGH (ref 0–35)

## 2020-08-29 NOTE — Telephone Encounter (Signed)
No 9/9 los

## 2020-09-07 IMAGING — CT CT HIP*L* W/O CM
2 of 3 series · 17 of 46 positions shown, 19 images · non-contrast
Comparison: Left hip radiographs 11/14/2019. Pelvic CT 08/22/2019
and 05/29/2019. PET-CT 02/20/2019.

CLINICAL DATA: Left hip pain. History of gallbladder cancer on
chemotherapy.

EXAM:
CT OF THE LEFT HIP WITHOUT CONTRAST
TECHNIQUE: Multidetector CT imaging of the left hip was performed according to
the standard protocol. Multiplanar CT image reconstructions were
also generated.

[Series 3: axial st · axial · 0.43mm/px · z∈[-666,-510]mm · 14 of 90 slices shown, 16 images]
[im 6/90  soft-tissue]
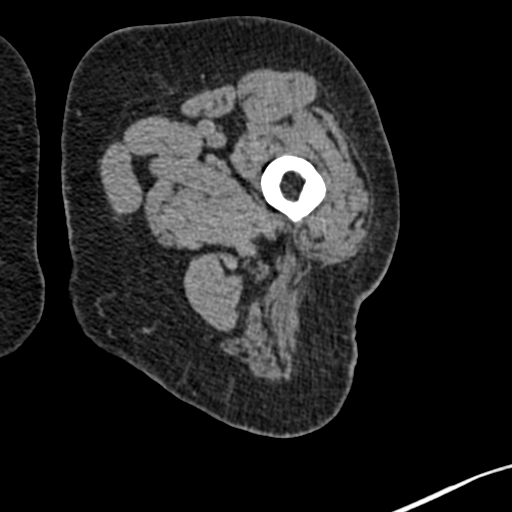
[im 6/90  bone]
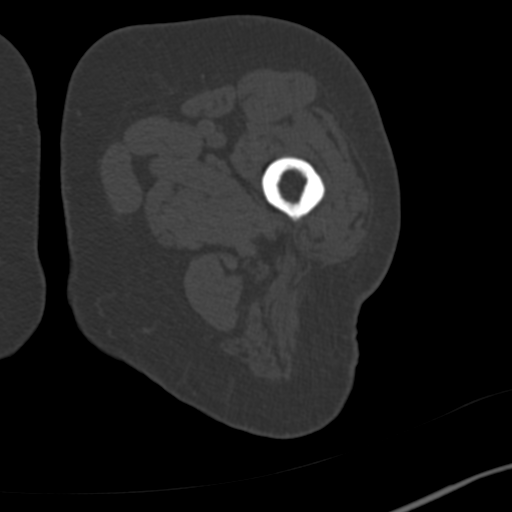
[im 12/90  soft-tissue]
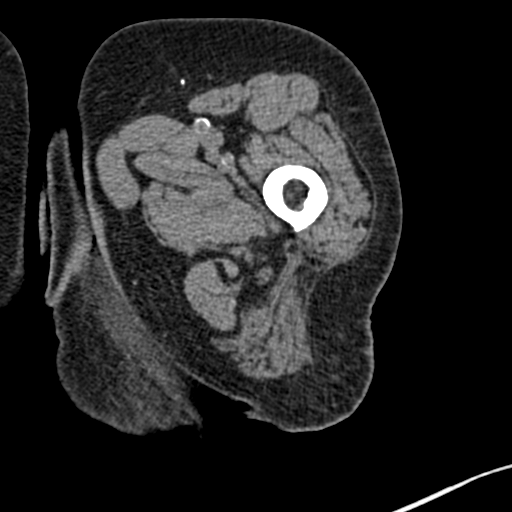
[im 18/90  soft-tissue]
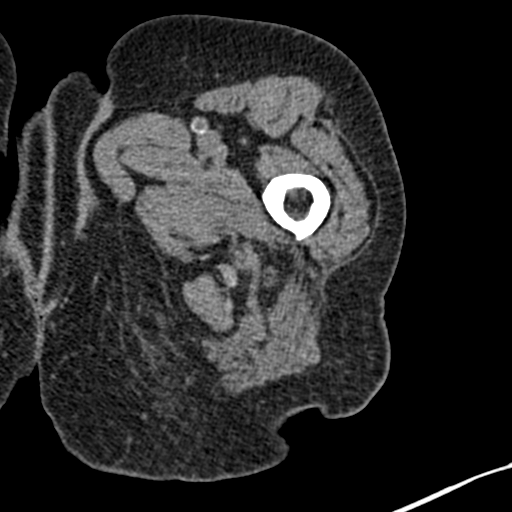
[im 23/90  soft-tissue]
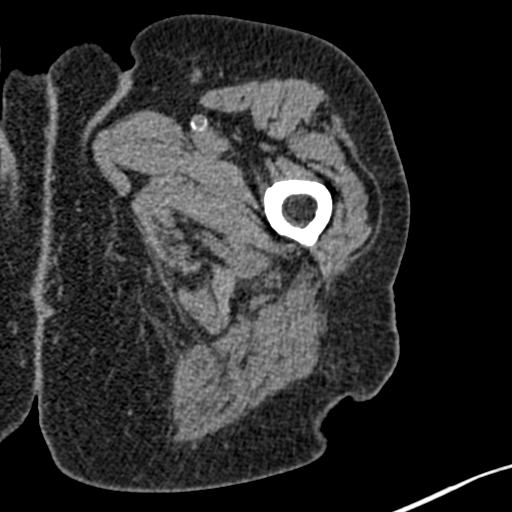
[im 29/90  soft-tissue]
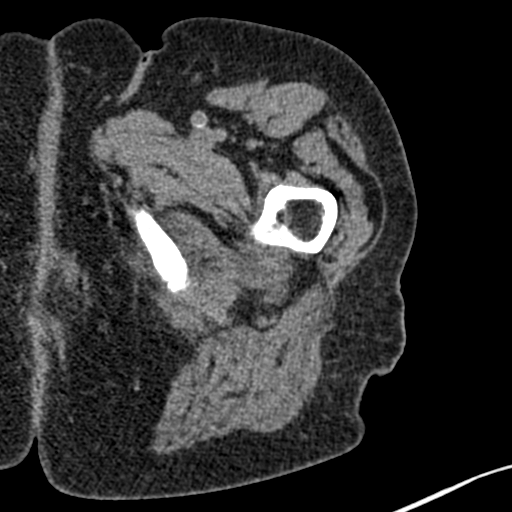
[im 35/90  soft-tissue]
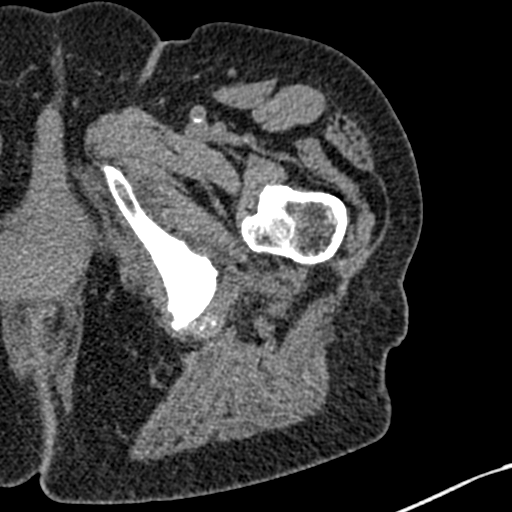
[im 41/90  soft-tissue]
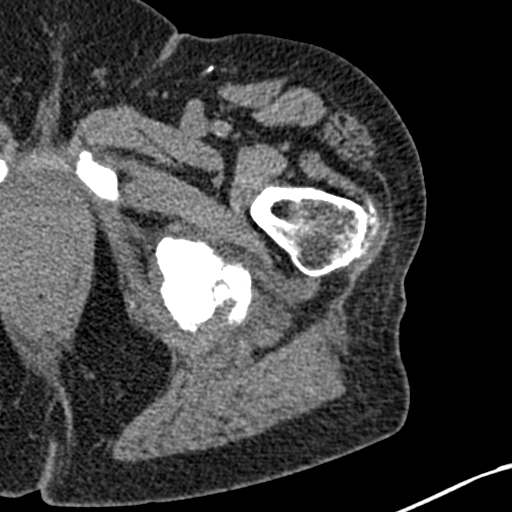
[im 49/90  soft-tissue]
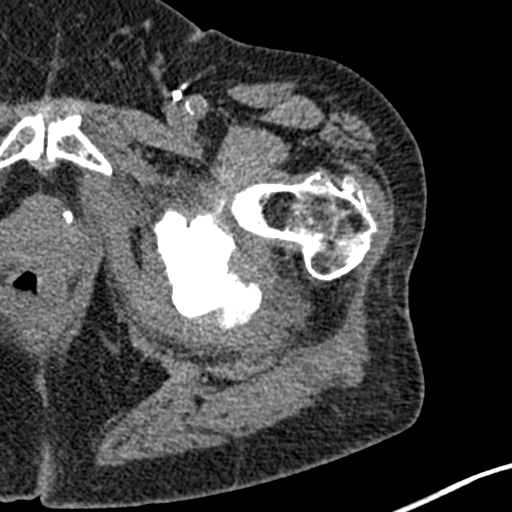
[im 55/90  soft-tissue]
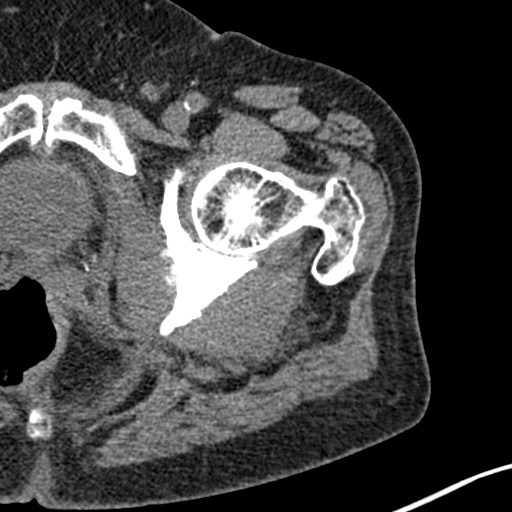
[im 55/90  bone]
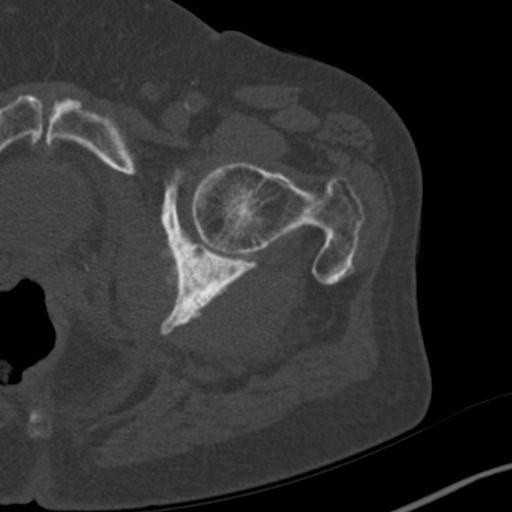
[im 61/90  soft-tissue]
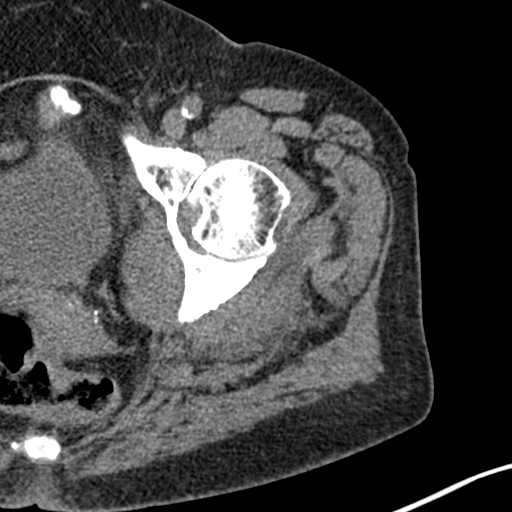
[im 67/90  soft-tissue]
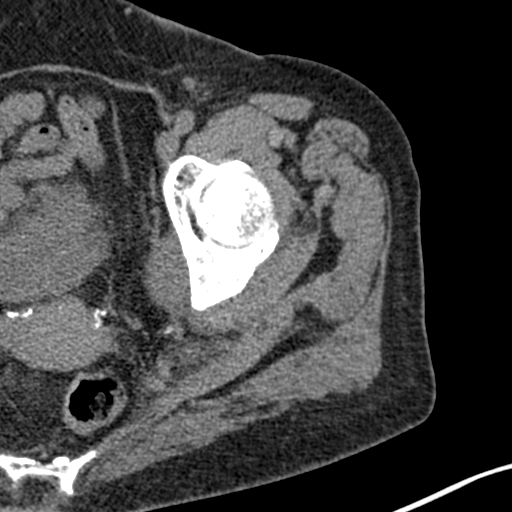
[im 72/90  soft-tissue]
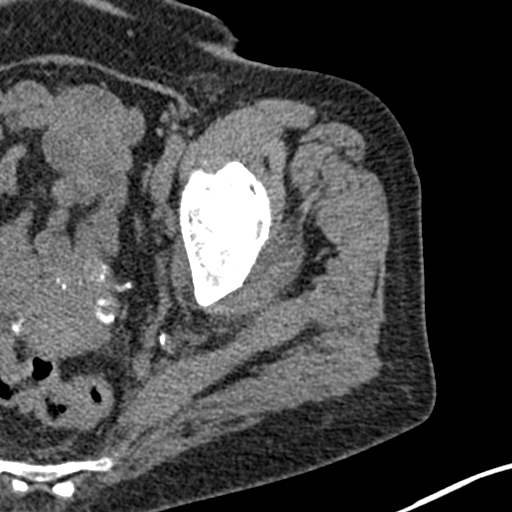
[im 78/90  soft-tissue]
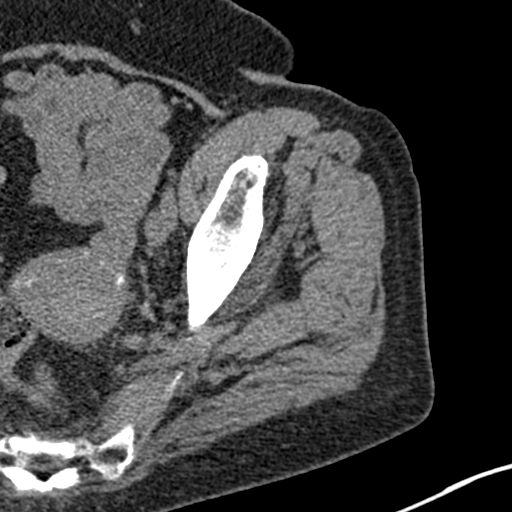
[im 84/90  soft-tissue]
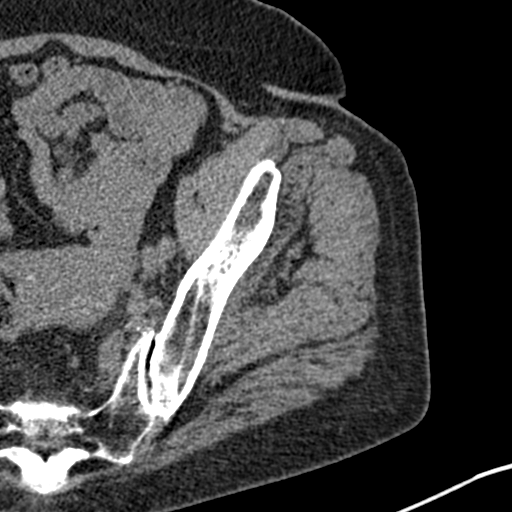

[Series 8: coronal st · coronal · 0.39mm/px · 3 of 104 slices shown]
[im 35/104  soft-tissue]
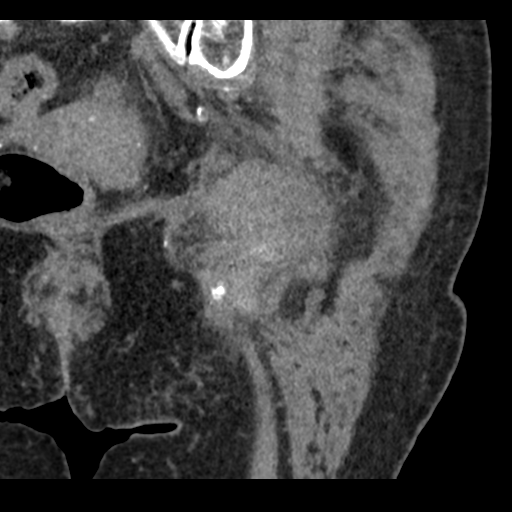
[im 46/104  soft-tissue]
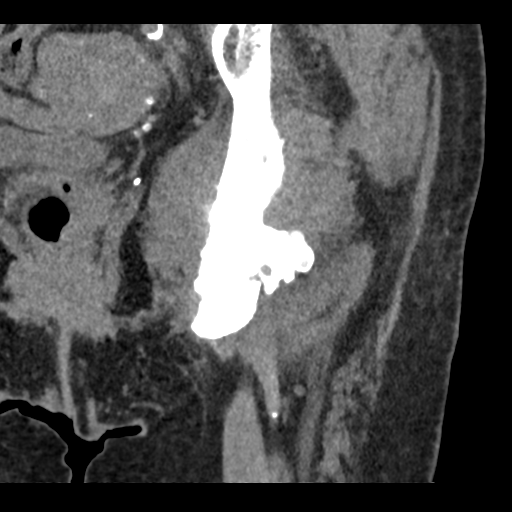
[im 58/104  soft-tissue]
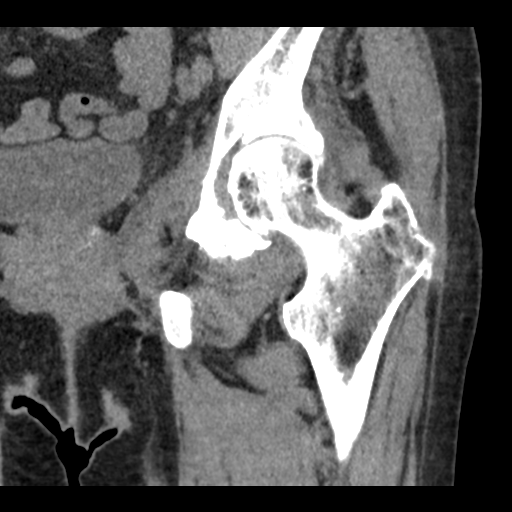

[17 of 46 positions shown; findings below may reference images not displayed]

FINDINGS: Bones/Joint/Cartilage

A chronic sclerotic metastasis involving the left ischium is
unchanged. The bone remains mildly expanded laterally without
pathologic fracture. Associated soft tissue components both medially
and laterally have progressed from previous PET-CT, measuring up to
7.3 cm in total on axial image 34/3. There is a stable small
sclerotic lesion in the left superior pubic ramus. The proximal left
femur is intact without focal abnormality. Mild osteitis pubis
noted. There are mild left hip degenerative changes without
significant joint effusion.

Ligaments

Suboptimally assessed by CT.

Muscles and Tendons

Left common hamstring tendinosis without tear. No focal muscular
abnormalities.

Soft tissues

Iliofemoral atherosclerosis. No focal soft tissue masses or left
inguinal adenopathy. There are surgical clips in the left groin.
IMPRESSION: 1. Stable chronic sclerotic metastasis involving the left ischium.
Associated soft tissue components have progressed, but there is no
evidence of pathologic fracture.
2. No acute osseous findings.
3. Left common hamstring tendinosis without tear.

## 2020-09-09 MED FILL — CAPECITABINE 500 MG TABLET: 500 | 28 days supply | Qty: 42 | Fill #1

## 2020-09-11 ENCOUNTER — Inpatient Hospital Stay: Payer: Medicare Other

## 2020-09-11 ENCOUNTER — Other Ambulatory Visit: Payer: Self-pay

## 2020-09-11 VITALS — BP 133/74 | HR 61 | Temp 97.7°F | Resp 16 | Wt 114.8 lb

## 2020-09-11 DIAGNOSIS — C23 Malignant neoplasm of gallbladder: Secondary | ICD-10-CM

## 2020-09-11 DIAGNOSIS — Z5111 Encounter for antineoplastic chemotherapy: Secondary | ICD-10-CM | POA: Diagnosis not present

## 2020-09-11 DIAGNOSIS — Z7189 Other specified counseling: Secondary | ICD-10-CM

## 2020-09-11 LAB — CBC WITH DIFFERENTIAL (CANCER CENTER ONLY)
Abs Immature Granulocytes: 0.03 10*3/uL (ref 0.00–0.07)
Basophils Absolute: 0 10*3/uL (ref 0.0–0.1)
Basophils Relative: 1 %
Eosinophils Absolute: 0.4 10*3/uL (ref 0.0–0.5)
Eosinophils Relative: 7 %
HCT: 34.2 % — ABNORMAL LOW (ref 36.0–46.0)
Hemoglobin: 11.4 g/dL — ABNORMAL LOW (ref 12.0–15.0)
Immature Granulocytes: 1 %
Lymphocytes Relative: 12 %
Lymphs Abs: 0.7 10*3/uL (ref 0.7–4.0)
MCH: 32.8 pg (ref 26.0–34.0)
MCHC: 33.3 g/dL (ref 30.0–36.0)
MCV: 98.3 fL (ref 80.0–100.0)
Monocytes Absolute: 0.5 10*3/uL (ref 0.1–1.0)
Monocytes Relative: 9 %
Neutro Abs: 4.4 10*3/uL (ref 1.7–7.7)
Neutrophils Relative %: 70 %
Platelet Count: 308 10*3/uL (ref 150–400)
RBC: 3.48 MIL/uL — ABNORMAL LOW (ref 3.87–5.11)
RDW: 17.2 % — ABNORMAL HIGH (ref 11.5–15.5)
WBC Count: 6.1 10*3/uL (ref 4.0–10.5)
nRBC: 0 % (ref 0.0–0.2)

## 2020-09-11 LAB — CMP (CANCER CENTER ONLY)
ALT: 19 U/L (ref 0–44)
AST: 34 U/L (ref 15–41)
Albumin: 3.2 g/dL — ABNORMAL LOW (ref 3.5–5.0)
Alkaline Phosphatase: 70 U/L (ref 38–126)
Anion gap: 3 — ABNORMAL LOW (ref 5–15)
BUN: 12 mg/dL (ref 8–23)
CO2: 27 mmol/L (ref 22–32)
Calcium: 9.9 mg/dL (ref 8.9–10.3)
Chloride: 108 mmol/L (ref 98–111)
Creatinine: 0.91 mg/dL (ref 0.44–1.00)
GFR, Est AFR Am: >60 mL/min (ref >60)
GFR, Estimated: >60 mL/min (ref >60)
Glucose, Bld: 98 mg/dL (ref 70–99)
Potassium: 4.2 mmol/L (ref 3.5–5.1)
Sodium: 138 mmol/L (ref 135–145)
Total Bilirubin: 0.6 mg/dL (ref 0.3–1.2)
Total Protein: 8 g/dL (ref 6.5–8.1)

## 2020-09-11 LAB — MAGNESIUM: Magnesium: 1.8 mg/dL (ref 1.7–2.4)

## 2020-09-11 MED ORDER — DEXTROSE 5 % IV SOLN
Freq: Once | INTRAVENOUS | Status: AC
  Filled 2020-09-11: qty 250

## 2020-09-11 MED ORDER — PALONOSETRON HCL INJECTION 0.25 MG/5ML
0.2500 mg | Freq: Once | INTRAVENOUS | Status: AC
  Administered 2020-09-11: 0.25 mg via INTRAVENOUS

## 2020-09-11 MED ORDER — SODIUM CHLORIDE 0.9% FLUSH
10.0000 mL | Freq: Once | INTRAVENOUS | Status: AC | PRN
  Administered 2020-09-11: 10 mL
  Filled 2020-09-11: qty 10

## 2020-09-11 MED ORDER — PALONOSETRON HCL INJECTION 0.25 MG/5ML
INTRAVENOUS | Status: AC
  Filled 2020-09-11: qty 5

## 2020-09-11 MED ORDER — SODIUM CHLORIDE 0.9 % IV SOLN
10.0000 mg | Freq: Once | INTRAVENOUS | Status: AC
  Administered 2020-09-11: 10 mg via INTRAVENOUS
  Filled 2020-09-11: qty 10
  Filled 2020-09-11: qty 1

## 2020-09-11 MED ORDER — SODIUM CHLORIDE 0.9% FLUSH
10.0000 mL | INTRAVENOUS | Status: DC | PRN
  Administered 2020-09-11: 10 mL
  Filled 2020-09-11: qty 10

## 2020-09-11 MED ORDER — OXALIPLATIN CHEMO INJECTION 100 MG/20ML
40.0000 mg/m2 | Freq: Once | INTRAVENOUS | Status: AC
  Administered 2020-09-11: 60 mg via INTRAVENOUS
  Filled 2020-09-11: qty 12

## 2020-09-11 MED ORDER — HEPARIN SOD (PORK) LOCK FLUSH 100 UNIT/ML IV SOLN
500.0000 [IU] | Freq: Once | INTRAVENOUS | Status: AC | PRN
  Administered 2020-09-11: 500 [IU]
  Filled 2020-09-11: qty 5

## 2020-09-11 NOTE — Patient Instructions (Signed)
[facility] Discharge Instructions for Patients Receiving Chemotherapy  Today you received the following chemotherapy agents: Oxaliplatin.  To help prevent nausea and vomiting after your treatment, we encourage you to take your nausea medication as directed.   If you develop nausea and vomiting that is not controlled by your nausea medication, call the clinic.   BELOW ARE SYMPTOMS THAT SHOULD BE REPORTED IMMEDIATELY:  *FEVER GREATER THAN 100.5 F  *CHILLS WITH OR WITHOUT FEVER  NAUSEA AND VOMITING THAT IS NOT CONTROLLED WITH YOUR NAUSEA MEDICATION  *UNUSUAL SHORTNESS OF BREATH  *UNUSUAL BRUISING OR BLEEDING  TENDERNESS IN MOUTH AND THROAT WITH OR WITHOUT PRESENCE OF ULCERS  *URINARY PROBLEMS  *BOWEL PROBLEMS  UNUSUAL RASH Items with * indicate a potential emergency and should be followed up as soon as possible.  Feel free to call the clinic should you have any questions or concerns. The clinic phone number is [phone].  Please show the [facility] at check-in to the Emergency Department and triage nurse.  Denosumab injection What is this medicine? DENOSUMAB (den oh sue mab) slows bone breakdown. Prolia is used to treat osteoporosis in women after menopause and in men, and in people who are taking corticosteroids for 6 months or more. Delton See is used to treat a high calcium level due to cancer and to prevent bone fractures and other bone problems caused by multiple myeloma or cancer bone metastases. Delton See is also used to treat giant cell tumor of the bone. This medicine may be used for other purposes; ask your health care provider or pharmacist if you have questions. COMMON BRAND NAME(S): Prolia, XGEVA What should I tell my health care provider before I take this medicine? They need to know if you have any of these conditions:  dental disease  having surgery or tooth extraction  infection  kidney disease  low levels of calcium or Vitamin D in the  blood  malnutrition  on hemodialysis  skin conditions or sensitivity  thyroid or parathyroid disease  an unusual reaction to denosumab, other medicines, foods, dyes, or preservatives  pregnant or trying to get pregnant  breast-feeding How should I use this medicine? This medicine is for injection under the skin. It is given by a health care professional in a hospital or clinic setting. A special MedGuide will be given to you before each treatment. Be sure to read this information carefully each time. For Prolia, talk to your pediatrician regarding the use of this medicine in children. Special care may be needed. For Delton See, talk to your pediatrician regarding the use of this medicine in children. While this drug may be prescribed for children as young as 13 years for selected conditions, precautions do apply. Overdosage: If you think you have taken too much of this medicine contact a poison control center or emergency room at once. NOTE: This medicine is only for you. Do not share this medicine with others. What if I miss a dose? It is important not to miss your dose. Call your doctor or health care professional if you are unable to keep an appointment. What may interact with this medicine? Do not take this medicine with any of the following medications:  other medicines containing denosumab This medicine may also interact with the following medications:  medicines that lower your chance of fighting infection  steroid medicines like prednisone or cortisone This list may not describe all possible interactions. Give your health care provider a list of all the medicines, herbs, non-prescription  drugs, or dietary supplements you use. Also tell them if you smoke, drink alcohol, or use illegal drugs. Some items may interact with your medicine. What should I watch for while using this medicine? Visit your doctor or health care professional for regular checks on your progress. Your doctor or  health care professional may order blood tests and other tests to see how you are doing. Call your doctor or health care professional for advice if you get a fever, chills or sore throat, or other symptoms of a cold or flu. Do not treat yourself. This drug may decrease your body's ability to fight infection. Try to avoid being around people who are sick. You should make sure you get enough calcium and vitamin D while you are taking this medicine, unless your doctor tells you not to. Discuss the foods you eat and the vitamins you take with your health care professional. See your dentist regularly. Brush and floss your teeth as directed. Before you have any dental work done, tell your dentist you are receiving this medicine. Do not become pregnant while taking this medicine or for 5 months after stopping it. Talk with your doctor or health care professional about your birth control options while taking this medicine. Women should inform their doctor if they wish to become pregnant or think they might be pregnant. There is a potential for serious side effects to an unborn child. Talk to your health care professional or pharmacist for more information. What side effects may I notice from receiving this medicine? Side effects that you should report to your doctor or health care professional as soon as possible:  allergic reactions like skin rash, itching or hives, swelling of the face, lips, or tongue  bone pain  breathing problems  dizziness  jaw pain, especially after dental work  redness, blistering, peeling of the skin  signs and symptoms of infection like fever or chills; cough; sore throat; pain or trouble passing urine  signs of low calcium like fast heartbeat, muscle cramps or muscle pain; pain, tingling, numbness in the hands or feet; seizures  unusual bleeding or bruising  unusually weak or tired Side effects that usually do not require medical attention (report to your doctor or  health care professional if they continue or are bothersome):  constipation  diarrhea  headache  joint pain  loss of appetite  muscle pain  runny nose  tiredness  upset stomach This list may not describe all possible side effects. Call your doctor for medical advice about side effects. You may report side effects to FDA at 1-800-FDA-1088. Where should I keep my medicine? This medicine is only given in a clinic, doctor's office, or other health care setting and will not be stored at home. NOTE: This sheet is a summary. It may not cover all possible information. If you have questions about this medicine, talk to your doctor, pharmacist, or health care provider.  2020 [employer]/Gold Standard (2018-04-14 16:10:44)

## 2020-09-11 NOTE — Patient Instructions (Signed)

## 2020-09-22 NOTE — Progress Notes (Signed)
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 Lymph node, biopsy, mediastinal - LYMPH NODE WITH METASTATIC ADENOCARCINOMA. - SEE MICROSCOPIC DESCRIPTION 2. Plaque, coronary artery - CALCIFIED ATHEROSCLEROTIC PLAQUE.   10/14/2018 Miscellaneous   Foundation One:  MSI stable tumor mutation burden 3Muts/mb ERBB2 amplification CCNE1 amplification TP53 mutation(+) CDK6 amplification  HGF amplification    11/13/2018 Cancer Staging   Staging form: Gallbladder, AJCC 8th Edition - Clinical stage from 11/13/2018: Stage IVB (cTX, cN2, pM1) - Signed by Truitt Merle, MD on 12/15/2018   11/17/2018 Imaging   11/17/2018 CT CAP IMPRESSION: 1. 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 This is associated with extensive upper abdominal and retroperitoneal lymphadenopathy, as well as metastatic lymphadenopathy in the posterior mediastinum and left supraclavicular region. There is also a metastatic lesion to the left ischium. 2. 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 3. Aortic atherosclerosis, in addition to left main and 3 vessel coronary artery disease. Status post median sternotomy for CABG including LIMA to the LAD. 4. There are calcifications of the aortic valve. Echocardiographic correlation for  evaluation of potential valvular dysfunction may be warranted if clinically indicated.    11/21/2018 Initial Diagnosis   Gallbladder cancer ([place])   11/27/2018 Pathology Results   11/27/2018 CA19-9 immunohistochemical stain Per request, a CA19-9 immunohistochemical stain was performed at an outside institution revealing positive staining in the tumor cells.    12/15/2018 - 11/30/2019 Chemotherapy   First line chemo cisplatin and gemcitabine every 2 weeks on 12/15/18. Had 1 month chemo break in April, restarted on 04/20/19. Stopped 11/30/19 due to disease progression.    01/26/2019 -  Chemotherapy   Zometa q6month starting 01/26/19-11/02/19. Switched to monthly Xgeva injections on 04/03/20   02/20/2019 PET scan   Restaging PET scan:  IMPRESSION: 1. Positive response to therapy at all primary and metastatic sites. Persistent primary and metastatic lesions do have intense hypermetabolic activity albeit reduced. 2. Decrease in size and hypermetabolic activity of mass lesion in the gallbladder fundus. 3. Interval decrease in size and metabolic activity of periportal adenopathy. 4. Decrease in size and metabolic activity of periaortic lymph nodes. 5. Interval decrease in size and metabolic activity of destructive lesion in the LEFT inferior pubic ramus with new sclerosis. 6. No new or progressive disease.   05/29/2019 Imaging   CT CAP W Contrast  IMPRESSION: Known gallbladder adenocarcinoma, difficult to compare to recent PET, improved from prior CT.  Upper abdominal/retroperitoneal lymphadenopathy, slightly improved from prior PET.  Osseous metastasis involving the left inferior pubic ramus, grossly unchanged.  No evidence of metastatic disease in the chest.  Additional ancillary findings as above.   08/22/2019 Imaging   CT CAP W Contrast  IMPRESSION: 1. Stable soft tissue mass involving the gallbladder fundus. 2. Stable mild porta hepatis and retroperitoneal  lymphadenopathy. 3. Stable sclerotic bone metastases. 4. No new or progressive metastatic disease  identified. 5. Stable 2.3 cm left thyroid lobe nodule.   11/14/2019 Imaging   CT Left Hip WO  contrast  IMPRESSION: 1. 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 Associated soft tissue components have progressed, but there is no evidence of pathologic fracture. 2. No acute osseous findings. 3. 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   12/11/2019 PET scan   IMPRESSION: 1. Significant progression of malignancy, with increased activity in the gallbladder mass; considerable increase in size and activity of porta hepatis, retroperitoneal, and upper pelvic adenopathy; significant increase in size and metabolic activity of the left ischial metastatic lesion with extensive extraosseous component; and new mildly hypermetabolic left supraclavicular and thoracic periaortic lymph nodes suspicious for malignant involvement. 2. Faintly increased activity in a left thyroid nodule which was not previously hypermetabolic. Given the relatively low-grade activity, this may be amenable to surveillance, but a significant minority of thyroid nodules with hypermetabolic activity can represent thyroid cancer. 3. Other imaging findings of potential clinical significance: Chronic paranasal sinusitis. Chronic right middle lobe atelectasis. Aortic Atherosclerosis (ICD10-I70.0). Coronary atherosclerosis with cardiomegaly.   12/17/2019 - 01/07/2020 Radiation Therapy   Palliative Radiation to left hip with Dr. Lisbeth Renshaw 12/17/19-01/07/20   01/10/2020 - 04/28/2020 Chemotherapy   Xeloda 1500 mg BID 1 week on/1 week off starting on 01/10/20, she tolerated poorly and developed n/v, weakness, and low po intake. She was instructed to dose reduce to 500 mg BID which she continued into C2. Increased to 1036m Am and 5058mPM on 02/12/20 and increased to 100064mID starting 02/26/20 starting with C4. Last  dose taken 04/28/20 for this regimen   04/30/2020 PET scan   IMPRESSION: 1. Increase in size of FDG avid gallbladder tumor. There is also been mild increase in size of FDG avid abdominal nodal metastases. Additionally, there is a new left-sided posterior mediastinal paravertebral FDG avid nodal metastases. 2. Similar appearance of small FDG avid left supraclavicular and subcarinal nodal metastases. Mild decrease in size of large sclerotic metastasis with soft tissue component involving the left acetabulum. 3.  Aortic Atherosclerosis (ICD10-I70.0).   05/06/2020 -  Chemotherapy   Second-line CAPOX q2weeks with Xeloda 1000m23m the AM and 500mg39mthe PM 1 week on/1 week off on 05/06/20 and Oxaliplatin starting 05/07/20. Due to poor toleration with n/v/d, we dose reduced Oxaliplatin to 40mg/4mtarting with C2 on 05/22/20.    07/24/2020 PET scan   IMPRESSION: 1. Enlarging gallbladder mass invading the liver with interval slight increased FDG uptake. 2. Slightly improved adenopathy as detailed above. 3. No new sites of metastatic disease are identified in the chest or abdomen. 4. Stable appearing extensive sclerotic lesion involving the left acetabulum. No new sites of osseous disease.      CURRENT THERAPY:  -Zometa q3month59monthng 01/26/19-11/02/19. Switched to monthly Xgeva injectionson 04/03/20. -Second-line CAPOX q2weeks with Xeloda 1000mg in71m AM and 500mg in 59mPM 1 week on/1 week offon 05/06/20 and Oxaliplatin starting 05/07/20.Due to poor toleration with n/v/d, we dose reduced Oxaliplatin to 40mg/m2 s29ming with C2 on 05/22/20.  INTERVAL HISTORY:  Cassie Campbell a follow up. She presents to the clinic alone.  She is overall clinically stable, does notice slightly worsening epigastric pain lately, especially after eating.  Pain usually resolves spontaneously, she takes Tylenol as needed.  She has mild tingling and pain in the left hip area, which is stable.  She was  quite fatigued for for 5 days after last cycle chemo, and recovered well.  She  takes Megace as needed, not every day, appetite sometimes low.  She is able to function well at home, and lives independently.  Her weight is stable.  No neuropathy or other new complaints.  All other systems were reviewed with the patient and are negative.  MEDICAL HISTORY:  Past Medical History:  Diagnosis Date   Allergic rhinitis    Arthritis    Asthma    xolair s 8/05 ?11/07; mastered hfa 12/20/08   Benign positional vertigo    CAD (coronary artery disease)    a. 09/2014 NSTEMI s/p LHC with sig 2V dz. dLAD diffusely diseased and not suitable for PCI. unsuccessful RCA PCI d/t heavy calcifications   gallbladder ca dx'd 10/2018   GERD (gastroesophageal reflux disease)    Heart attack ([place])    09/2014   Hyperlipidemia    <130 ldl pos fm hx, bp   Hypertension    Osteopenia    dexa 08/22/07 AP spine + 1.1, left femur -1.3, right femur -.8; dexa 10/06/09 +1.6, left femur =1.6, right femur -.   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 breast bx--for microcalcifications in rt. retroareolar region--dx was hyalinized fibroadenoma   BUNIONECTOMY Bilateral    CARDIAC CATHETERIZATION     2015   CATARACT EXTRACTION Right 02/02/2018   Dr Satira Sark   CATARACT EXTRACTION Left 03/30/2018   CERVICAL [place] SURGERY  12/01   CORONARY ARTERY BYPASS GRAFT N/A 10/04/2018   Procedure: CORONARY ARTERY BYPASS GRAFTING (CABG) times 2 using left  Internal mammary artery to LAD, left greater saphenous vein - open harvest.;  Surgeon: [name], MD;  Location: [place];  Service: Open Heart Surgery;  Laterality: N/A;   IR IMAGING GUIDED PORT INSERTION  12/08/2018   LEFT HEART CATH AND CORONARY ANGIOGRAPHY  N/A 09/27/2018   Procedure: LEFT HEART CATH AND CORONARY ANGIOGRAPHY;  Surgeon: Troy Sine, MD;  Location: [place] [facility];  Service: Cardiovascular;  Laterality: N/A;   LEFT HEART CATHETERIZATION WITH CORONARY ANGIOGRAM N/A 10/16/2014   Procedure: LEFT HEART CATHETERIZATION WITH CORONARY ANGIOGRAM;  Surgeon: Blane Ohara, MD;  Location: [facility] CATH LAB;  Service: Cardiovascular;  Laterality: N/A;   Left L4-5 transforaminal lumbar interbody fusion with Depuy cage, rods and screws, local and allograft bone graft, Vivigen; bilateral decompression/partial hemilaminectomy lumbar five-sacral one  07/2017   PERCUTANEOUS CORONARY ROTOBLATOR INTERVENTION (PCI-R) N/A 10/17/2014   Procedure: PERCUTANEOUS CORONARY ROTOBLATOR INTERVENTION (PCI-R);  Surgeon: Troy Sine, MD;  Location: [facility] CATH LAB;  Service: Cardiovascular;  Laterality: N/A;   TEE WITHOUT CARDIOVERSION N/A 10/04/2018   Procedure: TRANSESOPHAGEAL ECHOCARDIOGRAM (TEE);  Surgeon: [name], MD;  Location: [place];  Service: Open Heart Surgery;  Laterality: N/A;   VEIN LIGATION AND STRIPPING Left    VIDEO BRONCHOSCOPY Bilateral 02/05/2015   Procedure: VIDEO BRONCHOSCOPY WITHOUT FLUORO;  Surgeon: Tanda Rockers, MD;  Location: [facility];  Service: Endoscopy;  Laterality: Bilateral;   VULVA /PERINEUM BIOPSY  12-30-10   --epidermoid cyst    I have reviewed the social history and family history with the patient and they are unchanged from previous note.  ALLERGIES:  is allergic to fish-derived products, penicillins, aspirin, brilinta [ticagrelor], codeine phosphate, and diphenhydramine hcl.  MEDICATIONS:  Current Outpatient Medications  Medication Sig Dispense Refill  albuterol (PROAIR HFA) 108 (90 Base) MCG/ACT inhaler INHALE 1 TO 2 PUFFS BY MOUTH EVERY 4 HOURS AS NEEDED FOR WHEEZING 8.5 g 3   atorvastatin (LIPITOR) 40 MG tablet Take 1 tablet (40 mg total) by mouth daily. 90 tablet 3   bisoprolol (ZEBETA) 10 MG  tablet TAKE 1 TABLET(10 MG) BY MOUTH DAILY 90 tablet 2   budesonide-formoterol (SYMBICORT) 160-4.5 MCG/ACT inhaler INHALE 2 PUFFS BY MOUTH EVERY 12 HOURS 30.6 g 3   capecitabine (XELODA) 500 MG tablet Take 2 tablets in the am and 1 tablet in the pm.  7 days on 7 days off 42 tablet 1   cetirizine (ZYRTEC) 10 MG tablet Take 10 mg by mouth at bedtime as needed for allergies.     cholecalciferol 2000 units TABS Take 1 tablet (2,000 Units total) by mouth daily. 30 tablet 3   clopidogrel (PLAVIX) 75 MG tablet TAKE 1 TABLET(75 MG) BY MOUTH DAILY 90 tablet 3   dextromethorphan-guaiFENesin (MUCINEX DM) 30-600 MG per 12 hr tablet Take 1-2 tablets by mouth every 12 (twelve) hours as needed for cough (with flutter).      famotidine (PEPCID) 20 MG tablet Take 20 mg by mouth as needed.      ferrous sulfate 325 (65 FE) MG tablet Take 650 mg by mouth daily with breakfast.      fluticasone (FLONASE) 50 MCG/ACT nasal spray Place 1-2 sprays into both nostrils 2 (two) times daily as needed for allergies or rhinitis.     lidocaine-prilocaine (EMLA) cream APPLY EXTERNALLY TO THE AFFECTED AREA ONCE DAILY AS DIRECTED 30 g 3   magic mouthwash SOLN Take 5 mLs by mouth 4 (four) times daily. Leave Benadryl out of compound since pt allergic to it. Use hydrocortisone & nystatin. Swish & swallow or spit for thrush 240 mL 1   magnesium 30 MG tablet Take 1 tablet (30 mg total) by mouth 2 (two) times daily. 30 tablet 0   magnesium oxide (MAG-OX) 400 (241.3 Mg) MG tablet TAKE 1 TABLET(400 MG) BY MOUTH THREE TIMES DAILY 90 tablet 2   meclizine (ANTIVERT) 25 MG tablet TAKE 2 TABLETS BY MOUTH THREE TIMES DAILY AS NEEDED 24 tablet 2   megestrol (MEGACE ES) 625 MG/5ML suspension Take 5 mLs (625 mg total) by mouth daily. 150 mL 0   montelukast (SINGULAIR) 10 MG tablet TAKE 1 TABLET(10 MG) BY MOUTH AT BEDTIME 90 tablet 1   Multiple Vitamin (MULTIVITAMIN) capsule Take 1 capsule by mouth daily.      ondansetron (ZOFRAN) 8  MG tablet Take 1 tablet (8 mg total) by mouth every 8 (eight) hours as needed for nausea or vomiting. 30 tablet 1   oxyCODONE (OXY IR/ROXICODONE) 5 MG immediate release tablet Take 1 tablet (5 mg total) by mouth every 8 (eight) hours as needed for severe pain. 45 tablet 0   pantoprazole (PROTONIX) 40 MG tablet Take 1 tablet (40 mg total) by mouth daily. 30 tablet 1   sacubitril-valsartan (ENTRESTO) 24-26 MG Take 1 tablet by mouth 2 (two) times daily. 180 tablet 3   No current facility-administered medications for this visit.    PHYSICAL EXAMINATION: ECOG PERFORMANCE STATUS: 1 - Symptomatic but completely ambulatory  Vitals:   09/25/20 0927  BP: 116/65  Pulse: 74  Resp: 18  Temp: 97.8 F (36.6 C)  SpO2: 100%   Filed Weights   09/25/20 0927  Weight: 113 lb 1.6 oz (51.3 kg)    GENERAL:alert, no distress and comfortable SKIN: skin color, texture, turgor  are normal, no rashes or significant lesions EYES: normal, Conjunctiva are pink and non-injected, sclera clear NECK: supple, thyroid normal size, non-tender, without nodularity LYMPH:  no palpable lymphadenopathy in the cervical, axillary  LUNGS: clear to auscultation and percussion with normal breathing effort HEART: regular rate & rhythm and no murmurs and no lower extremity edema ABDOMEN:abdomen soft, non-tender and normal bowel sounds Musculoskeletal:no cyanosis of digits and no clubbing  NEURO: alert & oriented x 3 with fluent speech, no focal motor/sensory deficits  LABORATORY DATA:  I have reviewed the data as listed CBC Latest Ref Rng & Units 09/25/2020 09/11/2020 08/28/2020  WBC 4.0 - 10.5 K/uL 6.5 6.1 5.6  Hemoglobin 12.0 - 15.0 g/dL 11.7(L) 11.4(L) 12.0  Hematocrit 36 - 46 % 35.0(L) 34.2(L) 35.9(L)  Platelets 150 - 400 K/uL 287 308 299     CMP Latest Ref Rng & Units 09/11/2020 08/28/2020 07/31/2020  Glucose 70 - 99 mg/dL 98 96 105(H)  BUN 8 - 23 mg/dL 12 15 10   Creatinine 0.44 - 1.00 mg/dL 0.91 0.90 0.95  Sodium 135  - 145 mmol/L 138 139 138  Potassium 3.5 - 5.1 mmol/L 4.2 4.4 4.2  Chloride 98 - 111 mmol/L 108 105 103  CO2 22 - 32 mmol/L 27 26 24   Calcium 8.9 - 10.3 mg/dL 9.9 10.1 10.4(H)  Total Protein 6.5 - 8.1 g/dL 8.0 8.3(H) 8.1  Total Bilirubin 0.3 - 1.2 mg/dL 0.6 0.6 0.8  Alkaline Phos 38 - 126 U/L 70 101 63  AST 15 - 41 U/L 34 46(H) 33  ALT 0 - 44 U/L 19 23 15       RADIOGRAPHIC STUDIES: I have personally reviewed the radiological images as listed and agreed with the findings in the report. No results found.   ASSESSMENT & PLAN:  CADINCE HILSCHER is a 79 y.o. 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  -She is also onZometa q86monthstarting 01/26/19. Switched to monthly Xgeva on 04/03/20 due to kidney function. -She progressed on second-line Xeloda based on 04/2020 PET after 4-5 months of treatment.  -She is currently on third-line CAPOX q2weeks with Xeloda10024mam and 50029mm, 1 week on/1 week offsince 05/06/20 and Oxaliplatin starting 05/07/20. She is on low dose -She is clinically stable overall, due to prolonged fatigue, I will reduce her oxaliplatin dose from 40 mg/m to 30 mg/m2 today  -We will continue Xeloda at current dose -Follow-up in 2 weeks, plan to repeat PET scan in 4 weeks   2.CAD, S/P CABGX2 on 09/27/2018, EF25-30% -Currently on Plavix.  -f/u with Dr. KelClaiborne Billingsecent ECHO showed worsening EF -not symptomatic, no edema on exam today   4. Asthma, dyspnea -Currently on Symbicort, Singulair, and albuterol as needed -f/u with pulmonary, stable, no recent flare   5.Goal of care discussion -The patient understands the goal of care is palliative. -she is full code for now  6. Mild Anemia -Secondary to  chemoand her open heart surgery -I discussed her anemia may contribute to her mild SOB. -Previously normal, but mildly low lately.Stable  7. Neuropathy -Developed with cycle 11cisplatin/gemcitabine, mild intermittent tingling to fingertips -Improved onoral B complex vitamin, continue on Xeloda.  -near resolved now    Plan -I called in Protonix for her epigastric pain  -lab reviewed, adequate for treatment,  will proceed CAPOX today at slightly decreased oxaliplatin dose to 30 mg/m -Lab, follow-up and treatment in 2 weeks -I ordered a PET scan to be done in about 4 weeks -continue Xgeva every months   No problem-specific Assessment & Plan notes found for this encounter.   Orders Placed This Encounter  Procedures   NM PET Image Restag (PS) Skull Base To Thigh    Standing Status:   Future    Standing Expiration Date:   09/25/2021    Order Specific Question:   If indicated for the ordered procedure, I authorize the administration of a radiopharmaceutical per Radiology protocol    Answer:   Yes    Order Specific Question:   Preferred imaging location?    Answer:   [facility]    Order Specific Question:   Release to patient    Answer:   Immediate   All questions were answered. The patient knows to call the clinic with any problems, questions or concerns. No barriers to learning was detected. The total time spent in the appointment was 30 minutes.     Truitt Merle, MD 09/25/2020   I, Joslyn Devon, am acting as scribe for Truitt Merle, MD.   I have reviewed the above documentation for accuracy and completeness, and I agree with the above.

## 2020-09-25 ENCOUNTER — Encounter: Payer: Self-pay | Admitting: Hematology

## 2020-09-25 ENCOUNTER — Inpatient Hospital Stay: Payer: Medicare Other

## 2020-09-25 ENCOUNTER — Other Ambulatory Visit: Payer: Self-pay

## 2020-09-25 ENCOUNTER — Telehealth: Payer: Self-pay | Admitting: Hematology

## 2020-09-25 ENCOUNTER — Inpatient Hospital Stay (HOSPITAL_BASED_OUTPATIENT_CLINIC_OR_DEPARTMENT_OTHER): Payer: Medicare Other | Admitting: Hematology

## 2020-09-25 ENCOUNTER — Other Ambulatory Visit: Payer: Self-pay | Admitting: Hematology

## 2020-09-25 ENCOUNTER — Inpatient Hospital Stay: Payer: Medicare Other | Attending: Obstetrics

## 2020-09-25 VITALS — BP 116/65 | HR 74 | Temp 97.8°F | Resp 18 | Ht 59.0 in | Wt 113.1 lb

## 2020-09-25 DIAGNOSIS — C23 Malignant neoplasm of gallbladder: Secondary | ICD-10-CM | POA: Diagnosis not present

## 2020-09-25 DIAGNOSIS — Z23 Encounter for immunization: Secondary | ICD-10-CM | POA: Diagnosis not present

## 2020-09-25 DIAGNOSIS — C778 Secondary and unspecified malignant neoplasm of lymph nodes of multiple regions: Secondary | ICD-10-CM | POA: Diagnosis not present

## 2020-09-25 DIAGNOSIS — Z7189 Other specified counseling: Secondary | ICD-10-CM

## 2020-09-25 DIAGNOSIS — Z5111 Encounter for antineoplastic chemotherapy: Secondary | ICD-10-CM | POA: Diagnosis not present

## 2020-09-25 DIAGNOSIS — I2510 Atherosclerotic heart disease of native coronary artery without angina pectoris: Secondary | ICD-10-CM | POA: Insufficient documentation

## 2020-09-25 DIAGNOSIS — C7951 Secondary malignant neoplasm of bone: Secondary | ICD-10-CM | POA: Insufficient documentation

## 2020-09-25 DIAGNOSIS — G620 Drug-induced polyneuropathy: Secondary | ICD-10-CM | POA: Insufficient documentation

## 2020-09-25 DIAGNOSIS — D6481 Anemia due to antineoplastic chemotherapy: Secondary | ICD-10-CM | POA: Insufficient documentation

## 2020-09-25 LAB — CBC WITH DIFFERENTIAL (CANCER CENTER ONLY)
Abs Immature Granulocytes: 0.04 10*3/uL (ref 0.00–0.07)
Basophils Absolute: 0 10*3/uL (ref 0.0–0.1)
Basophils Relative: 1 %
Eosinophils Absolute: 0.4 10*3/uL (ref 0.0–0.5)
Eosinophils Relative: 7 %
HCT: 35 % — ABNORMAL LOW (ref 36.0–46.0)
Hemoglobin: 11.7 g/dL — ABNORMAL LOW (ref 12.0–15.0)
Immature Granulocytes: 1 %
Lymphocytes Relative: 10 %
Lymphs Abs: 0.7 10*3/uL (ref 0.7–4.0)
MCH: 33.7 pg (ref 26.0–34.0)
MCHC: 33.4 g/dL (ref 30.0–36.0)
MCV: 100.9 fL — ABNORMAL HIGH (ref 80.0–100.0)
Monocytes Absolute: 0.6 10*3/uL (ref 0.1–1.0)
Monocytes Relative: 10 %
Neutro Abs: 4.6 10*3/uL (ref 1.7–7.7)
Neutrophils Relative %: 71 %
Platelet Count: 287 10*3/uL (ref 150–400)
RBC: 3.47 MIL/uL — ABNORMAL LOW (ref 3.87–5.11)
RDW: 17.9 % — ABNORMAL HIGH (ref 11.5–15.5)
WBC Count: 6.5 10*3/uL (ref 4.0–10.5)
nRBC: 0 % (ref 0.0–0.2)

## 2020-09-25 LAB — CMP (CANCER CENTER ONLY)
ALT: 17 U/L (ref 0–44)
AST: 34 U/L (ref 15–41)
Albumin: 3.3 g/dL — ABNORMAL LOW (ref 3.5–5.0)
Alkaline Phosphatase: 64 U/L (ref 38–126)
Anion gap: 7 (ref 5–15)
BUN: 11 mg/dL (ref 8–23)
CO2: 27 mmol/L (ref 22–32)
Calcium: 10.6 mg/dL — ABNORMAL HIGH (ref 8.9–10.3)
Chloride: 106 mmol/L (ref 98–111)
Creatinine: 1.01 mg/dL — ABNORMAL HIGH (ref 0.44–1.00)
GFR, Estimated: 53 mL/min — ABNORMAL LOW (ref >60)
Glucose, Bld: 108 mg/dL — ABNORMAL HIGH (ref 70–99)
Potassium: 4.5 mmol/L (ref 3.5–5.1)
Sodium: 140 mmol/L (ref 135–145)
Total Bilirubin: 0.7 mg/dL (ref 0.3–1.2)
Total Protein: 8.1 g/dL (ref 6.5–8.1)

## 2020-09-25 LAB — MAGNESIUM: Magnesium: 1.5 mg/dL — ABNORMAL LOW (ref 1.7–2.4)

## 2020-09-25 MED ORDER — CAPECITABINE 500 MG PO TABS: ORAL_TABLET | ORAL | 1 refills | Status: DC

## 2020-09-25 MED ORDER — PALONOSETRON HCL INJECTION 0.25 MG/5ML
INTRAVENOUS | Status: AC
  Filled 2020-09-25: qty 5

## 2020-09-25 MED ORDER — SODIUM CHLORIDE 0.9 % IV SOLN
10.0000 mg | Freq: Once | INTRAVENOUS | Status: AC
  Administered 2020-09-25: 10 mg via INTRAVENOUS
  Filled 2020-09-25: qty 10

## 2020-09-25 MED ORDER — PALONOSETRON HCL INJECTION 0.25 MG/5ML
0.2500 mg | Freq: Once | INTRAVENOUS | Status: AC
  Administered 2020-09-25: 0.25 mg via INTRAVENOUS

## 2020-09-25 MED ORDER — OXALIPLATIN CHEMO INJECTION 100 MG/20ML
30.0000 mg/m2 | Freq: Once | INTRAVENOUS | Status: AC
  Administered 2020-09-25: 45 mg via INTRAVENOUS
  Filled 2020-09-25: qty 9

## 2020-09-25 MED ORDER — SODIUM CHLORIDE 0.9% FLUSH
10.0000 mL | Freq: Once | INTRAVENOUS | Status: AC | PRN
  Administered 2020-09-25: 10 mL
  Filled 2020-09-25: qty 10

## 2020-09-25 MED ORDER — INFLUENZA VAC A&B SA ADJ QUAD 0.5 ML IM PRSY
0.5000 mL | PREFILLED_SYRINGE | Freq: Once | INTRAMUSCULAR | Status: AC
  Administered 2020-09-25: 0.5 mL via INTRAMUSCULAR

## 2020-09-25 MED ORDER — SODIUM CHLORIDE 0.9% FLUSH
10.0000 mL | INTRAVENOUS | Status: DC | PRN
  Administered 2020-09-25: 10 mL
  Filled 2020-09-25: qty 10

## 2020-09-25 MED ORDER — PANTOPRAZOLE SODIUM 40 MG PO TBEC: 40.0000 mg | DELAYED_RELEASE_TABLET | Freq: Every day | ORAL | 1 refills | Status: DC

## 2020-09-25 MED ORDER — INFLUENZA VAC A&B SA ADJ QUAD 0.5 ML IM PRSY
PREFILLED_SYRINGE | INTRAMUSCULAR | Status: AC
  Filled 2020-09-25: qty 0.5

## 2020-09-25 MED ORDER — HEPARIN SOD (PORK) LOCK FLUSH 100 UNIT/ML IV SOLN
500.0000 [IU] | Freq: Once | INTRAVENOUS | Status: AC | PRN
  Administered 2020-09-25: 500 [IU]
  Filled 2020-09-25: qty 5

## 2020-09-25 MED ORDER — DEXTROSE 5 % IV SOLN
Freq: Once | INTRAVENOUS | Status: AC
  Filled 2020-09-25: qty 250

## 2020-09-25 NOTE — Telephone Encounter (Signed)
Scheduled per 10/7 los. Messaged RN Cloyde Reams to give pt updated appt calendar.

## 2020-09-25 NOTE — Patient Instructions (Signed)

## 2020-09-25 NOTE — Patient Instructions (Signed)
[facility] Discharge Instructions for Patients Receiving Chemotherapy  Today you received the following chemotherapy agents: Oxaliplatin  To help prevent nausea and vomiting after your treatment, we encourage you to take your nausea medication as directed.   If you develop nausea and vomiting that is not controlled by your nausea medication, call the clinic.   BELOW ARE SYMPTOMS THAT SHOULD BE REPORTED IMMEDIATELY:  *FEVER GREATER THAN 100.5 F  *CHILLS WITH OR WITHOUT FEVER  NAUSEA AND VOMITING THAT IS NOT CONTROLLED WITH YOUR NAUSEA MEDICATION  *UNUSUAL SHORTNESS OF BREATH  *UNUSUAL BRUISING OR BLEEDING  TENDERNESS IN MOUTH AND THROAT WITH OR WITHOUT PRESENCE OF ULCERS  *URINARY PROBLEMS  *BOWEL PROBLEMS  UNUSUAL RASH Items with * indicate a potential emergency and should be followed up as soon as possible.  Feel free to call the clinic should you have any questions or concerns. The clinic phone number is [phone].  Please show the CHEMO ALERT CARD at check-in to the Emergency Department and triage nurse.   

## 2020-09-26 LAB — CANCER ANTIGEN 19-9: CA 19-9: 2920 U/mL — ABNORMAL HIGH (ref 0–35)

## 2020-10-01 ENCOUNTER — Telehealth: Payer: Self-pay | Admitting: Pharmacist

## 2020-10-01 NOTE — Telephone Encounter (Signed)
Oral Chemotherapy Pharmacist Encounter   Patient recently started pantoprazole for epigastric discomfort. There is a category C drug-drug interaction between Xeloda and pantoprazole, specifically proton-pump inhibitors can decrease efficacy of Xeloda.   DDI discussed with Dr. Burr Medico and patient will be changed to Pepcid (famotidine) 20 mg by mouth once daily. Patient stated she has Pepcid available at home and will stop taking the pantoprazole.  Patient knows to call the office with questions or concerns.  Leron Croak, PharmD, BCPS Hematology/Oncology Clinical Pharmacist [facility] [phone] 10/01/2020 3:54 PM

## 2020-10-03 ENCOUNTER — Inpatient Hospital Stay: Payer: Medicare Other

## 2020-10-03 ENCOUNTER — Other Ambulatory Visit: Payer: Self-pay

## 2020-10-03 DIAGNOSIS — C23 Malignant neoplasm of gallbladder: Secondary | ICD-10-CM | POA: Diagnosis present

## 2020-10-03 DIAGNOSIS — Z23 Encounter for immunization: Secondary | ICD-10-CM

## 2020-10-03 DIAGNOSIS — Z5111 Encounter for antineoplastic chemotherapy: Secondary | ICD-10-CM | POA: Diagnosis present

## 2020-10-06 ENCOUNTER — Other Ambulatory Visit: Payer: Self-pay | Admitting: Internal Medicine

## 2020-10-06 MED FILL — CAPECITABINE 500 MG TABLET: 500 | 28 days supply | Qty: 42 | Fill #0

## 2020-10-08 NOTE — Progress Notes (Signed)
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 Lymph node, biopsy, mediastinal - LYMPH NODE WITH METASTATIC ADENOCARCINOMA. - SEE MICROSCOPIC DESCRIPTION 2. Plaque, coronary artery - CALCIFIED ATHEROSCLEROTIC PLAQUE.   10/14/2018 Miscellaneous   Foundation One:  MSI stable tumor mutation burden 3Muts/mb ERBB2 amplification CCNE1 amplification TP53 mutation(+) CDK6 amplification  HGF amplification    11/13/2018 Cancer Staging   Staging form: Gallbladder, AJCC 8th Edition - Clinical stage from 11/13/2018: Stage IVB (cTX, cN2, pM1) - Signed by Truitt Merle, MD on 12/15/2018   11/17/2018 Imaging   11/17/2018 CT CAP IMPRESSION: 1. 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 This is associated with extensive upper abdominal and retroperitoneal lymphadenopathy, as well as metastatic lymphadenopathy in the posterior mediastinum and left supraclavicular region. There is also a metastatic lesion to the left ischium. 2. 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 3. Aortic atherosclerosis, in addition to left main and 3 vessel coronary artery disease. Status post median sternotomy for CABG including LIMA to the LAD. 4. There are calcifications of the aortic valve. Echocardiographic correlation for  evaluation of potential valvular dysfunction may be warranted if clinically indicated.    11/21/2018 Initial Diagnosis   Gallbladder cancer ([place])   11/27/2018 Pathology Results   11/27/2018 CA19-9 immunohistochemical stain Per request, a CA19-9 immunohistochemical stain was performed at an outside institution revealing positive staining in the tumor cells.    12/15/2018 - 11/30/2019 Chemotherapy   First line chemo cisplatin and gemcitabine every 2 weeks on 12/15/18. Had 1 month chemo break in April, restarted on 04/20/19. Stopped 11/30/19 due to disease progression.    01/26/2019 -  Chemotherapy   Zometa q50month starting 01/26/19-11/02/19. Switched to monthly Xgeva injections on 04/03/20   02/20/2019 PET scan   Restaging PET scan:  IMPRESSION: 1. Positive response to therapy at all primary and metastatic sites. Persistent primary and metastatic lesions do have intense hypermetabolic activity albeit reduced. 2. Decrease in size and hypermetabolic activity of mass lesion in the gallbladder fundus. 3. Interval decrease in size and metabolic activity of periportal adenopathy. 4. Decrease in size and metabolic activity of periaortic lymph nodes. 5. Interval decrease in size and metabolic activity of destructive lesion in the LEFT inferior pubic ramus with new sclerosis. 6. No new or progressive disease.   05/29/2019 Imaging   CT CAP W Contrast  IMPRESSION: Known gallbladder adenocarcinoma, difficult to compare to recent PET, improved from prior CT.  Upper abdominal/retroperitoneal lymphadenopathy, slightly improved from prior PET.  Osseous metastasis involving the left inferior pubic ramus, grossly unchanged.  No evidence of metastatic disease in the chest.  Additional ancillary findings as above.   08/22/2019 Imaging   CT CAP W Contrast  IMPRESSION: 1. Stable soft tissue mass involving the gallbladder fundus. 2. Stable mild porta hepatis and retroperitoneal  lymphadenopathy. 3. Stable sclerotic bone metastases. 4. No new or progressive metastatic disease identified.  5. Stable 2.3 cm left thyroid lobe nodule.   11/14/2019 Imaging   CT Left Hip WO  contrast  IMPRESSION: 1. 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 Associated soft tissue components have progressed, but there is no evidence of pathologic fracture. 2. No acute osseous findings. 3. 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   12/11/2019 PET scan   IMPRESSION: 1. Significant progression of malignancy, with increased activity in the gallbladder mass; considerable increase in size and activity of porta hepatis, retroperitoneal, and upper pelvic adenopathy; significant increase in size and metabolic activity of the left ischial metastatic lesion with extensive extraosseous component; and new mildly hypermetabolic left supraclavicular and thoracic periaortic lymph nodes suspicious for malignant involvement. 2. Faintly increased activity in a left thyroid nodule which was not previously hypermetabolic. Given the relatively low-grade activity, this may be amenable to surveillance, but a significant minority of thyroid nodules with hypermetabolic activity can represent thyroid cancer. 3. Other imaging findings of potential clinical significance: Chronic paranasal sinusitis. Chronic right middle lobe atelectasis. Aortic Atherosclerosis (ICD10-I70.0). Coronary atherosclerosis with cardiomegaly.   12/17/2019 - 01/07/2020 Radiation Therapy   Palliative Radiation to left hip with Dr. Lisbeth Renshaw 12/17/19-01/07/20   01/10/2020 - 04/28/2020 Chemotherapy   Xeloda 1500 mg BID 1 week on/1 week off starting on 01/10/20, she tolerated poorly and developed n/v, weakness, and low po intake. She was instructed to dose reduce to 500 mg BID which she continued into C2. Increased to 1066m Am and 5056mPM on 02/12/20 and increased to 100058mID starting 02/26/20 starting with C4. Last  dose taken 04/28/20 for this regimen   04/30/2020 PET scan   IMPRESSION: 1. Increase in size of FDG avid gallbladder tumor. There is also been mild increase in size of FDG avid abdominal nodal metastases. Additionally, there is a new left-sided posterior mediastinal paravertebral FDG avid nodal metastases. 2. Similar appearance of small FDG avid left supraclavicular and subcarinal nodal metastases. Mild decrease in size of large sclerotic metastasis with soft tissue component involving the left acetabulum. 3.  Aortic Atherosclerosis (ICD10-I70.0).   05/06/2020 -  Chemotherapy   Second-line CAPOX q2weeks with Xeloda 1000m85m the AM and 500mg33mthe PM 1 week on/1 week off on 05/06/20 and Oxaliplatin starting 05/07/20. Due to poor toleration with n/v/d, we dose reduced Oxaliplatin to 40mg/71mtarting with C2 on 05/22/20.    07/24/2020 PET scan   IMPRESSION: 1. Enlarging gallbladder mass invading the liver with interval slight increased FDG uptake. 2. Slightly improved adenopathy as detailed above. 3. No new sites of metastatic disease are identified in the chest or abdomen. 4. Stable appearing extensive sclerotic lesion involving the left acetabulum. No new sites of osseous disease.      CURRENT THERAPY:  -Zometa q3month61monthng 01/26/19-11/02/19. Switched to monthly Xgeva injectionson 04/03/20. -Second-line CAPOX q2weeks with Xeloda 1000mg in25m AM and 500mg in 39mPM 1 week on/1 week offon 05/06/20 and Oxaliplatin starting 05/07/20.Due to poor toleration with n/v/d, we dose reduced Oxaliplatin to 40mg/m2 s76ming with C2 on 05/22/20  INTERVAL HISTORY:  Cassie JJATORIA KNEELANDor a follow up. She presents to the clinic alone. She notes she is doing well. She notes she is no longer on stronger Protonix. She is now on Pepcid. She notes she may need additional tums. She notes her last cycle chemo went well with no new issues. She notes her energy is adequate and no skin toxicity and no  neuropathy. She denies new pain but has stable right  leg pain managed with Tylenol. She also notes occasional right abdominal pain. She has oxycodone at home but has not needed to use it recently.    REVIEW OF SYSTEMS:   Constitutional: Denies fevers, chills or abnormal weight loss Eyes: Denies blurriness of vision Ears, nose, mouth, throat, and face: Denies mucositis or sore throat Respiratory: Denies cough, dyspnea or wheezes Cardiovascular: Denies palpitation, chest discomfort or lower extremity swelling Gastrointestinal:  Denies nausea, heartburn or change in bowel habits (+) Occasional right abdominal pain MSK: (+) Left hip pain stable  Skin: Denies abnormal skin rashes Lymphatics: Denies new lymphadenopathy or easy bruising Neurological:Denies numbness, tingling or new weaknesses Behavioral/Psych: Mood is stable, no new changes  All other systems were reviewed with the patient and are negative.  MEDICAL HISTORY:  Past Medical History:  Diagnosis Date   Allergic rhinitis    Arthritis    Asthma    xolair s 8/05 ?11/07; mastered hfa 12/20/08   Benign positional vertigo    CAD (coronary artery disease)    a. 09/2014 NSTEMI s/p LHC with sig 2V dz. dLAD diffusely diseased and not suitable for PCI. unsuccessful RCA PCI d/t heavy calcifications   gallbladder ca dx'd 10/2018   GERD (gastroesophageal reflux disease)    Heart attack ([place])    09/2014   Hyperlipidemia    <130 ldl pos fm hx, bp   Hypertension    Osteopenia    dexa 08/22/07 AP spine + 1.1, left femur -1.3, right femur -.8; dexa 10/06/09 +1.6, left femur =1.6, right femur -.   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 breast bx--for microcalcifications in rt. retroareolar region--dx was hyalinized fibroadenoma    BUNIONECTOMY Bilateral    CARDIAC CATHETERIZATION     2015   CATARACT EXTRACTION Right 02/02/2018   Dr Satira Sark   CATARACT EXTRACTION Left 03/30/2018   CERVICAL [place] SURGERY  12/01   CORONARY ARTERY BYPASS GRAFT N/A 10/04/2018   Procedure: CORONARY ARTERY BYPASS GRAFTING (CABG) times 2 using left  Internal mammary artery to LAD, left greater saphenous vein - open harvest.;  Surgeon: [name], MD;  Location: [place];  Service: Open Heart Surgery;  Laterality: N/A;   IR IMAGING GUIDED PORT INSERTION  12/08/2018   LEFT HEART CATH AND CORONARY ANGIOGRAPHY N/A 09/27/2018   Procedure: LEFT HEART CATH AND CORONARY ANGIOGRAPHY;  Surgeon: Troy Sine, MD;  Location: [place] [facility];  Service: Cardiovascular;  Laterality: N/A;   LEFT HEART CATHETERIZATION WITH CORONARY ANGIOGRAM N/A 10/16/2014   Procedure: LEFT HEART CATHETERIZATION WITH CORONARY ANGIOGRAM;  Surgeon: Blane Ohara, MD;  Location: [facility] CATH LAB;  Service: Cardiovascular;  Laterality: N/A;   Left L4-5 transforaminal lumbar interbody fusion with Depuy cage, rods and screws, local and allograft bone graft, Vivigen; bilateral decompression/partial hemilaminectomy lumbar five-sacral one  07/2017   PERCUTANEOUS CORONARY ROTOBLATOR INTERVENTION (PCI-R) N/A 10/17/2014   Procedure: PERCUTANEOUS CORONARY ROTOBLATOR INTERVENTION (PCI-R);  Surgeon: Troy Sine, MD;  Location[facility] C[facility] LAB;  Service: Cardiovascular;  Laterality: N/A;   TEE WITHOUT CARDIOVERSION N/A 10/04/2018   Procedure: TRANSESOPHAGEAL ECHOCARDIOGRAM (TEE);  Surgeon: [name], MD;  Location: [place];  Service: Open Heart Surgery;  Laterality: N/A;   VEIN LIGATION AND STRIPPING Left    VIDEO BRONCHOSCOPY Bilateral  02/05/2015   Procedure: VIDEO BRONCHOSCOPY WITHOUT FLUORO;  Surgeon: Tanda Rockers, MD;  Location: [facility];  Service: Endoscopy;  Laterality: Bilateral;   VULVA /PERINEUM BIOPSY  12-30-10   --epidermoid cyst    I have  reviewed the social history and family history with the patient and they are unchanged from previous note.  ALLERGIES:  is allergic to fish-derived products, penicillins, aspirin, brilinta [ticagrelor], codeine phosphate, and diphenhydramine hcl.  MEDICATIONS:  Current Outpatient Medications  Medication Sig Dispense Refill   albuterol (VENTOLIN HFA) 108 (90 Base) MCG/ACT inhaler INHALE 1 TO 2 PUFFS BY MOUTH EVERY 4 HOURS AS NEEDED FOR WHEEZING 8.5 g 0   atorvastatin (LIPITOR) 40 MG tablet Take 1 tablet (40 mg total) by mouth daily. 90 tablet 3   bisoprolol (ZEBETA) 10 MG tablet TAKE 1 TABLET(10 MG) BY MOUTH DAILY 90 tablet 2   budesonide-formoterol (SYMBICORT) 160-4.5 MCG/ACT inhaler INHALE 2 PUFFS BY MOUTH EVERY 12 HOURS 30.6 g 3   capecitabine (XELODA) 500 MG tablet Take 2 tablets in the am and 1 tablet in the pm.  7 days on 7 days off 42 tablet 1   cetirizine (ZYRTEC) 10 MG tablet Take 10 mg by mouth at bedtime as needed for allergies.     cholecalciferol 2000 units TABS Take 1 tablet (2,000 Units total) by mouth daily. 30 tablet 3   clopidogrel (PLAVIX) 75 MG tablet TAKE 1 TABLET(75 MG) BY MOUTH DAILY 90 tablet 3   dextromethorphan-guaiFENesin (MUCINEX DM) 30-600 MG per 12 hr tablet Take 1-2 tablets by mouth every 12 (twelve) hours as needed for cough (with flutter).      famotidine (PEPCID) 20 MG tablet Take 20 mg by mouth as needed.      ferrous sulfate 325 (65 FE) MG tablet Take 650 mg by mouth daily with breakfast.      fluticasone (FLONASE) 50 MCG/ACT nasal spray Place 1-2 sprays into both nostrils 2 (two) times daily as needed for allergies or rhinitis.     lidocaine-prilocaine (EMLA) cream APPLY EXTERNALLY TO THE AFFECTED AREA ONCE DAILY AS DIRECTED 30 g 3   magic mouthwash SOLN Take 5 mLs by mouth 4 (four) times daily. Leave Benadryl out of compound since pt allergic to it. Use hydrocortisone & nystatin. Swish & swallow or spit for thrush 240 mL 1   magnesium 30 MG  tablet Take 1 tablet (30 mg total) by mouth 2 (two) times daily. 30 tablet 0   magnesium oxide (MAG-OX) 400 (241.3 Mg) MG tablet TAKE 1 TABLET(400 MG) BY MOUTH THREE TIMES DAILY 90 tablet 2   meclizine (ANTIVERT) 25 MG tablet TAKE 2 TABLETS BY MOUTH THREE TIMES DAILY AS NEEDED 24 tablet 2   megestrol (MEGACE ES) 625 MG/5ML suspension Take 5 mLs (625 mg total) by mouth daily. 150 mL 0   montelukast (SINGULAIR) 10 MG tablet TAKE 1 TABLET(10 MG) BY MOUTH AT BEDTIME 90 tablet 1   Multiple Vitamin (MULTIVITAMIN) capsule Take 1 capsule by mouth daily.      ondansetron (ZOFRAN) 8 MG tablet Take 1 tablet (8 mg total) by mouth every 8 (eight) hours as needed for nausea or vomiting. 30 tablet 1   oxyCODONE (OXY IR/ROXICODONE) 5 MG immediate release tablet Take 1 tablet (5 mg total) by mouth every 8 (eight) hours as needed for severe pain. 45 tablet 0   sacubitril-valsartan (ENTRESTO) 24-26 MG Take 1 tablet by mouth 2 (two) times daily. 180 tablet 3   No current facility-administered medications for this  visit.   Facility-Administered Medications Ordered in Other Visits  Medication Dose Route Frequency Provider Last Rate Last Admin   dexamethasone (DECADRON) 10 mg in sodium chloride 0.9 % 50 mL IVPB  10 mg Intravenous Once Truitt Merle, MD       dextrose 5 % solution   Intravenous Once Truitt Merle, MD       heparin lock flush 100 unit/mL  500 Units Intracatheter Once PRN Truitt Merle, MD       oxaliplatin (ELOXATIN) 45 mg in dextrose 5 % 500 mL chemo infusion  30 mg/m2 (Treatment Plan Recorded) Intravenous Once Truitt Merle, MD       palonosetron (ALOXI) injection 0.25 mg  0.25 mg Intravenous Once Truitt Merle, MD       sodium chloride flush (NS) 0.9 % injection 10 mL  10 mL Intracatheter PRN Truitt Merle, MD        PHYSICAL EXAMINATION: ECOG PERFORMANCE STATUS: 1 - Symptomatic but completely ambulatory  Vitals:   10/10/20 0903  BP: 119/70  Pulse: 69  Resp: 18  Temp: 97.9 F (36.6 C)  SpO2: 100%    Filed Weights   10/10/20 0903  Weight: 113 lb 4.8 oz (51.4 kg)    Due to COVID19 we will limit examination to appearance. Patient had no complaints.  GENERAL:alert, no distress and comfortable SKIN: skin color normal, no rashes or significant lesions EYES: normal, Conjunctiva are pink and non-injected, sclera clear  NEURO: alert & oriented x 3 with fluent speech   LABORATORY DATA:  I have reviewed the data as listed CBC Latest Ref Rng & Units 10/10/2020 09/25/2020 09/11/2020  WBC 4.0 - 10.5 K/uL 6.7 6.5 6.1  Hemoglobin 12.0 - 15.0 g/dL 10.9(L) 11.7(L) 11.4(L)  Hematocrit 36 - 46 % 33.2(L) 35.0(L) 34.2(L)  Platelets 150 - 400 K/uL 282 287 308     CMP Latest Ref Rng & Units 10/10/2020 09/25/2020 09/11/2020  Glucose 70 - 99 mg/dL 101(H) 108(H) 98  BUN 8 - 23 mg/dL 15 11 12   Creatinine 0.44 - 1.00 mg/dL 1.00 1.01(H) 0.91  Sodium 135 - 145 mmol/L 138 140 138  Potassium 3.5 - 5.1 mmol/L 3.9 4.5 4.2  Chloride 98 - 111 mmol/L 106 106 108  CO2 22 - 32 mmol/L 24 27 27   Calcium 8.9 - 10.3 mg/dL 9.9 10.6(H) 9.9  Total Protein 6.5 - 8.1 g/dL 7.9 8.1 8.0  Total Bilirubin 0.3 - 1.2 mg/dL 0.7 0.7 0.6  Alkaline Phos 38 - 126 U/L 65 64 70  AST 15 - 41 U/L 35 34 34  ALT 0 - 44 U/L 16 17 19       RADIOGRAPHIC STUDIES: I have personally reviewed the radiological images as listed and agreed with the findings in the report. No results found.   ASSESSMENT & PLAN:  Cassie Campbell is a 79 y.o. 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  -She is also onZometa q7monthstarting 01/26/19. Switched to monthly Xgeva on 04/03/20 due to kidney function. -She progressed on second-line Xeloda based on 04/2020 PET after 4-5 months of treatment.  -She  is currently on third-line CAPOX q2weeks with Xeloda10047mam and 50039mm, 1 week on/1 week offsince 05/06/20 and Oxaliplatin starting 05/07/20. She is on low dose -She continues to tolerate chemo well overall  with no new concerns. Cold sensitivity manageable, no neuropathy. Labs reviewed and adequate to proceed with C11 Oxaliplatin at same low dose.  -We will continue Xeloda at current dose -Follow-up in 2 weeks, plan for PET on 10/20/20   2.CAD, S/P CABGX2 on 09/27/2018, EF25-30% -Currently on Plavix.  -f/u with Dr. Claiborne Billings   3. Asthma, dyspnea -Currently on Symbicort, Singulair, and albuterol as needed -f/u with pulmonary, stable, no recent flare  4.Goal of care discussion -The patient understands the goal of care is palliative. -she is full code for now  5. Mild Anemia -Secondary to chemoand her open heart surgery -I discussed her anemia may contribute to her mild SOB. -Previously normal, but mildly low lately.Stable  6. Epigastric pain  -For her Acid reflux she was switched from Protonix to Pepcid due to Protonix drug interaction with Xeloda.  -She has not required Oxycodone yet.    Plan -lab reviewed, adequate for treatment, will proceed CAPOX today at same dose.  -Lab, follow-up and Oxaliplatin in 2 weeks  -PET on 11/1 -continueXgeva today and every month   No problem-specific Assessment & Plan notes found for this encounter.   No orders of the defined types were placed in this encounter.  All questions were answered. The patient knows to call the clinic with any problems, questions or concerns. No barriers to learning was detected. The total time spent in the appointment was 30 minutes.     Truitt Merle, MD 10/10/2020   I, Joslyn Devon, am acting as scribe for Truitt Merle, MD.   I have reviewed the above documentation for accuracy and completeness, and I agree with the above.

## 2020-10-10 ENCOUNTER — Other Ambulatory Visit: Payer: Self-pay

## 2020-10-10 ENCOUNTER — Inpatient Hospital Stay: Payer: Medicare Other

## 2020-10-10 ENCOUNTER — Inpatient Hospital Stay (HOSPITAL_BASED_OUTPATIENT_CLINIC_OR_DEPARTMENT_OTHER): Payer: Medicare Other | Admitting: Hematology

## 2020-10-10 ENCOUNTER — Encounter: Payer: Self-pay | Admitting: Hematology

## 2020-10-10 VITALS — BP 119/70 | HR 69 | Temp 97.9°F | Resp 18 | Ht 59.0 in | Wt 113.3 lb

## 2020-10-10 DIAGNOSIS — Z5111 Encounter for antineoplastic chemotherapy: Secondary | ICD-10-CM | POA: Diagnosis not present

## 2020-10-10 DIAGNOSIS — Z7189 Other specified counseling: Secondary | ICD-10-CM

## 2020-10-10 DIAGNOSIS — C23 Malignant neoplasm of gallbladder: Secondary | ICD-10-CM

## 2020-10-10 LAB — CBC WITH DIFFERENTIAL (CANCER CENTER ONLY)
Abs Immature Granulocytes: 0.03 10*3/uL (ref 0.00–0.07)
Basophils Absolute: 0 10*3/uL (ref 0.0–0.1)
Basophils Relative: 1 %
Eosinophils Absolute: 0.6 10*3/uL — ABNORMAL HIGH (ref 0.0–0.5)
Eosinophils Relative: 9 %
HCT: 33.2 % — ABNORMAL LOW (ref 36.0–46.0)
Hemoglobin: 10.9 g/dL — ABNORMAL LOW (ref 12.0–15.0)
Immature Granulocytes: 0 %
Lymphocytes Relative: 12 %
Lymphs Abs: 0.8 10*3/uL (ref 0.7–4.0)
MCH: 32.3 pg (ref 26.0–34.0)
MCHC: 32.8 g/dL (ref 30.0–36.0)
MCV: 98.5 fL (ref 80.0–100.0)
Monocytes Absolute: 0.6 10*3/uL (ref 0.1–1.0)
Monocytes Relative: 8 %
Neutro Abs: 4.7 10*3/uL (ref 1.7–7.7)
Neutrophils Relative %: 70 %
Platelet Count: 282 10*3/uL (ref 150–400)
RBC: 3.37 MIL/uL — ABNORMAL LOW (ref 3.87–5.11)
RDW: 18.4 % — ABNORMAL HIGH (ref 11.5–15.5)
WBC Count: 6.7 10*3/uL (ref 4.0–10.5)
nRBC: 0 % (ref 0.0–0.2)

## 2020-10-10 LAB — CMP (CANCER CENTER ONLY)
ALT: 16 U/L (ref 0–44)
AST: 35 U/L (ref 15–41)
Albumin: 3.4 g/dL — ABNORMAL LOW (ref 3.5–5.0)
Alkaline Phosphatase: 65 U/L (ref 38–126)
Anion gap: 8 (ref 5–15)
BUN: 15 mg/dL (ref 8–23)
CO2: 24 mmol/L (ref 22–32)
Calcium: 9.9 mg/dL (ref 8.9–10.3)
Chloride: 106 mmol/L (ref 98–111)
Creatinine: 1 mg/dL (ref 0.44–1.00)
GFR, Estimated: 58 mL/min — ABNORMAL LOW (ref >60)
Glucose, Bld: 101 mg/dL — ABNORMAL HIGH (ref 70–99)
Potassium: 3.9 mmol/L (ref 3.5–5.1)
Sodium: 138 mmol/L (ref 135–145)
Total Bilirubin: 0.7 mg/dL (ref 0.3–1.2)
Total Protein: 7.9 g/dL (ref 6.5–8.1)

## 2020-10-10 MED ORDER — PALONOSETRON HCL INJECTION 0.25 MG/5ML
0.2500 mg | Freq: Once | INTRAVENOUS | Status: AC
  Administered 2020-10-10: 0.25 mg via INTRAVENOUS

## 2020-10-10 MED ORDER — SODIUM CHLORIDE 0.9% FLUSH
10.0000 mL | INTRAVENOUS | Status: DC | PRN
  Administered 2020-10-10: 10 mL
  Filled 2020-10-10: qty 10

## 2020-10-10 MED ORDER — DENOSUMAB 120 MG/1.7ML ~~LOC~~ SOLN
120.0000 mg | Freq: Once | SUBCUTANEOUS | Status: AC
  Administered 2020-10-10: 120 mg via SUBCUTANEOUS

## 2020-10-10 MED ORDER — SODIUM CHLORIDE 0.9% FLUSH
10.0000 mL | Freq: Once | INTRAVENOUS | Status: AC | PRN
  Administered 2020-10-10: 10 mL
  Filled 2020-10-10: qty 10

## 2020-10-10 MED ORDER — OXALIPLATIN CHEMO INJECTION 100 MG/20ML
30.0000 mg/m2 | Freq: Once | INTRAVENOUS | Status: AC
  Administered 2020-10-10: 45 mg via INTRAVENOUS
  Filled 2020-10-10: qty 9

## 2020-10-10 MED ORDER — SODIUM CHLORIDE 0.9 % IV SOLN
10.0000 mg | Freq: Once | INTRAVENOUS | Status: AC
  Administered 2020-10-10: 10 mg via INTRAVENOUS
  Filled 2020-10-10: qty 10

## 2020-10-10 MED ORDER — HEPARIN SOD (PORK) LOCK FLUSH 100 UNIT/ML IV SOLN
500.0000 [IU] | Freq: Once | INTRAVENOUS | Status: AC | PRN
  Administered 2020-10-10: 500 [IU]
  Filled 2020-10-10: qty 5

## 2020-10-10 MED ORDER — DENOSUMAB 120 MG/1.7ML ~~LOC~~ SOLN
SUBCUTANEOUS | Status: AC
  Filled 2020-10-10: qty 1.7

## 2020-10-10 MED ORDER — DEXTROSE 5 % IV SOLN
Freq: Once | INTRAVENOUS | Status: AC
  Filled 2020-10-10: qty 250

## 2020-10-10 MED ORDER — PALONOSETRON HCL INJECTION 0.25 MG/5ML
INTRAVENOUS | Status: AC
  Filled 2020-10-10: qty 5

## 2020-10-10 NOTE — Patient Instructions (Signed)
[facility] Discharge Instructions for Patients Receiving Chemotherapy  Today you received the following chemotherapy agents: Oxaliplatin.  To help prevent nausea and vomiting after your treatment, we encourage you to take your nausea medication as directed.   If you develop nausea and vomiting that is not controlled by your nausea medication, call the clinic.   BELOW ARE SYMPTOMS THAT SHOULD BE REPORTED IMMEDIATELY:  *FEVER GREATER THAN 100.5 F  *CHILLS WITH OR WITHOUT FEVER  NAUSEA AND VOMITING THAT IS NOT CONTROLLED WITH YOUR NAUSEA MEDICATION  *UNUSUAL SHORTNESS OF BREATH  *UNUSUAL BRUISING OR BLEEDING  TENDERNESS IN MOUTH AND THROAT WITH OR WITHOUT PRESENCE OF ULCERS  *URINARY PROBLEMS  *BOWEL PROBLEMS  UNUSUAL RASH Items with * indicate a potential emergency and should be followed up as soon as possible.  Feel free to call the clinic should you have any questions or concerns. The clinic phone number is [phone].  Please show the CHEMO ALERT CARD at check-in to the Emergency Department and triage nurse.  Denosumab injection What is this medicine? DENOSUMAB (den oh sue mab) slows bone breakdown. Prolia is used to treat osteoporosis in women after menopause and in men, and in people who are taking corticosteroids for 6 months or more. Xgeva is used to treat a high calcium level due to cancer and to prevent bone fractures and other bone problems caused by multiple myeloma or cancer bone metastases. Xgeva is also used to treat giant cell tumor of the bone. This medicine may be used for other purposes; ask your health care provider or pharmacist if you have questions. COMMON BRAND NAME(S): Prolia, XGEVA What should I tell my health care provider before I take this medicine? They need to know if you have any of these conditions:  dental disease  having surgery or tooth extraction  infection  kidney disease  low levels of calcium or Vitamin D in the  blood  malnutrition  on hemodialysis  skin conditions or sensitivity  thyroid or parathyroid disease  an unusual reaction to denosumab, other medicines, foods, dyes, or preservatives  pregnant or trying to get pregnant  breast-feeding How should I use this medicine? This medicine is for injection under the skin. It is given by a health care professional in a hospital or clinic setting. A special MedGuide will be given to you before each treatment. Be sure to read this information carefully each time. For Prolia, talk to your pediatrician regarding the use of this medicine in children. Special care may be needed. For Xgeva, talk to your pediatrician regarding the use of this medicine in children. While this drug may be prescribed for children as young as 13 years for selected conditions, precautions do apply. Overdosage: If you think you have taken too much of this medicine contact a poison control center or emergency room at once. NOTE: This medicine is only for you. Do not share this medicine with others. What if I miss a dose? It is important not to miss your dose. Call your doctor or health care professional if you are unable to keep an appointment. What may interact with this medicine? Do not take this medicine with any of the following medications:  other medicines containing denosumab This medicine may also interact with the following medications:  medicines that lower your chance of fighting infection  steroid medicines like prednisone or cortisone This list may not describe all possible interactions. Give your health care provider a list of all the medicines, herbs, non-prescription   drugs, or dietary supplements you use. Also tell them if you smoke, drink alcohol, or use illegal drugs. Some items may interact with your medicine. What should I watch for while using this medicine? Visit your doctor or health care professional for regular checks on your progress. Your doctor or  health care professional may order blood tests and other tests to see how you are doing. Call your doctor or health care professional for advice if you get a fever, chills or sore throat, or other symptoms of a cold or flu. Do not treat yourself. This drug may decrease your body's ability to fight infection. Try to avoid being around people who are sick. You should make sure you get enough calcium and vitamin D while you are taking this medicine, unless your doctor tells you not to. Discuss the foods you eat and the vitamins you take with your health care professional. See your dentist regularly. Brush and floss your teeth as directed. Before you have any dental work done, tell your dentist you are receiving this medicine. Do not become pregnant while taking this medicine or for 5 months after stopping it. Talk with your doctor or health care professional about your birth control options while taking this medicine. Women should inform their doctor if they wish to become pregnant or think they might be pregnant. There is a potential for serious side effects to an unborn child. Talk to your health care professional or pharmacist for more information. What side effects may I notice from receiving this medicine? Side effects that you should report to your doctor or health care professional as soon as possible:  allergic reactions like skin rash, itching or hives, swelling of the face, lips, or tongue  bone pain  breathing problems  dizziness  jaw pain, especially after dental work  redness, blistering, peeling of the skin  signs and symptoms of infection like fever or chills; cough; sore throat; pain or trouble passing urine  signs of low calcium like fast heartbeat, muscle cramps or muscle pain; pain, tingling, numbness in the hands or feet; seizures  unusual bleeding or bruising  unusually weak or tired Side effects that usually do not require medical attention (report to your doctor or  health care professional if they continue or are bothersome):  constipation  diarrhea  headache  joint pain  loss of appetite  muscle pain  runny nose  tiredness  upset stomach This list may not describe all possible side effects. Call your doctor for medical advice about side effects. You may report side effects to FDA at 1-800-FDA-1088. Where should I keep my medicine? This medicine is only given in a clinic, doctor's office, or other health care setting and will not be stored at home. NOTE: This sheet is a summary. It may not cover all possible information. If you have questions about this medicine, talk to your doctor, pharmacist, or health care provider.  2020 [employer] Standard (2018-04-14 16:10:44)  

## 2020-10-10 NOTE — Patient Instructions (Signed)

## 2020-10-13 ENCOUNTER — Telehealth: Payer: Self-pay | Admitting: Hematology

## 2020-10-13 NOTE — Telephone Encounter (Signed)
Scheduled appointments per 10/22 los. Spoke to patient who is aware of appointment date and time.

## 2020-10-20 ENCOUNTER — Ambulatory Visit (HOSPITAL_COMMUNITY): Payer: Medicare Other

## 2020-10-20 ENCOUNTER — Other Ambulatory Visit: Payer: Self-pay

## 2020-10-20 ENCOUNTER — Ambulatory Visit (HOSPITAL_COMMUNITY)
Admission: RE | Admit: 2020-10-20 | Discharge: 2020-10-20 | Disposition: A | Discharge status: Home / Self Care | Payer: Medicare Other | Source: Ambulatory Visit | Attending: Hematology | Admitting: Hematology

## 2020-10-20 DIAGNOSIS — I700 Atherosclerosis of aorta: Secondary | ICD-10-CM | POA: Insufficient documentation

## 2020-10-20 DIAGNOSIS — R160 Hepatomegaly, not elsewhere classified: Secondary | ICD-10-CM | POA: Insufficient documentation

## 2020-10-20 DIAGNOSIS — I2510 Atherosclerotic heart disease of native coronary artery without angina pectoris: Secondary | ICD-10-CM | POA: Insufficient documentation

## 2020-10-20 DIAGNOSIS — C23 Malignant neoplasm of gallbladder: Secondary | ICD-10-CM | POA: Diagnosis present

## 2020-10-20 DIAGNOSIS — J323 Chronic sphenoidal sinusitis: Secondary | ICD-10-CM | POA: Insufficient documentation

## 2020-10-20 DIAGNOSIS — D259 Leiomyoma of uterus, unspecified: Secondary | ICD-10-CM | POA: Insufficient documentation

## 2020-10-20 DIAGNOSIS — R599 Enlarged lymph nodes, unspecified: Secondary | ICD-10-CM | POA: Insufficient documentation

## 2020-10-20 DIAGNOSIS — J321 Chronic frontal sinusitis: Secondary | ICD-10-CM | POA: Insufficient documentation

## 2020-10-20 LAB — GLUCOSE, CAPILLARY: Glucose-Capillary: 99 mg/dL (ref 70–99)

## 2020-10-20 MED ORDER — FLUDEOXYGLUCOSE F - 18 (FDG) INJECTION
5.6000 | Freq: Once | INTRAVENOUS | Status: AC
  Administered 2020-10-20: 5.6 via INTRAVENOUS

## 2020-10-22 ENCOUNTER — Encounter: Payer: Self-pay | Admitting: Hematology

## 2020-10-22 ENCOUNTER — Inpatient Hospital Stay: Payer: Medicare Other | Attending: Obstetrics | Admitting: Hematology

## 2020-10-22 DIAGNOSIS — J45909 Unspecified asthma, uncomplicated: Secondary | ICD-10-CM | POA: Insufficient documentation

## 2020-10-22 DIAGNOSIS — Z5111 Encounter for antineoplastic chemotherapy: Secondary | ICD-10-CM | POA: Insufficient documentation

## 2020-10-22 DIAGNOSIS — C771 Secondary and unspecified malignant neoplasm of intrathoracic lymph nodes: Secondary | ICD-10-CM | POA: Insufficient documentation

## 2020-10-22 DIAGNOSIS — I2510 Atherosclerotic heart disease of native coronary artery without angina pectoris: Secondary | ICD-10-CM | POA: Insufficient documentation

## 2020-10-22 DIAGNOSIS — D6481 Anemia due to antineoplastic chemotherapy: Secondary | ICD-10-CM | POA: Insufficient documentation

## 2020-10-22 DIAGNOSIS — C23 Malignant neoplasm of gallbladder: Secondary | ICD-10-CM | POA: Insufficient documentation

## 2020-10-22 DIAGNOSIS — Z452 Encounter for adjustment and management of vascular access device: Secondary | ICD-10-CM | POA: Insufficient documentation

## 2020-10-22 DIAGNOSIS — C7951 Secondary malignant neoplasm of bone: Secondary | ICD-10-CM | POA: Insufficient documentation

## 2020-10-22 MED ORDER — REGORAFENIB 40 MG PO TABS: 120.0000 mg | ORAL_TABLET | Freq: Every day | ORAL | 0 refills | Status: DC

## 2020-10-22 NOTE — Progress Notes (Signed)
[place]   Telephone:(336) [phone] [phone]   Clinic Follow up Note   Patient Care Team: Tanda Rockers, MD as PCP - General (Pulmonary Disease) Troy Sine, MD as PCP - Cardiology (Cardiology)   I connected with Cassie Campbell on 10/22/2020 at  4:00 PM EDT by telephone visit and verified that I am speaking with the correct person using two identifiers.  I discussed the limitations, risks, security and privacy concerns of performing an evaluation and management service by telephone and the availability of in person appointments. I also discussed with the patient that there may be a patient responsible charge related to this service. The patient expressed understanding and agreed to proceed.   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 Lymph node, biopsy, mediastinal - LYMPH NODE WITH METASTATIC ADENOCARCINOMA. - SEE MICROSCOPIC DESCRIPTION 2. Plaque, coronary artery - CALCIFIED ATHEROSCLEROTIC PLAQUE.   10/14/2018 Miscellaneous   Foundation One:  MSI stable tumor mutation burden 3Muts/mb ERBB2 amplification CCNE1 amplification TP53 mutation(+) CDK6 amplification  HGF amplification    11/13/2018 Cancer Staging   Staging form: Gallbladder, AJCC 8th Edition - Clinical stage from 11/13/2018: Stage IVB (cTX, cN2, pM1) - Signed by Truitt Merle, MD on 12/15/2018   11/17/2018 Imaging   11/17/2018 CT CAP IMPRESSION: 1. Large  heterogeneously enhancing gallbladder mass, likely to reflect a primary gallbladder neoplasm. This is associated with extensive upper abdominal and retroperitoneal lymphadenopathy, as well as metastatic lymphadenopathy in the posterior mediastinum and left supraclavicular region. There is also a metastatic lesion to the left ischium. 2. 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 3. Aortic atherosclerosis, in addition to left main and 3 vessel coronary artery disease. Status post median sternotomy for CABG including LIMA to the LAD. 4. There are calcifications of the aortic valve. Echocardiographic correlation for evaluation of potential valvular dysfunction may be warranted if clinically indicated.    11/21/2018 Initial Diagnosis   Gallbladder cancer ([place])   11/27/2018 Pathology Results   11/27/2018 CA19-9 immunohistochemical stain Per request, a CA19-9 immunohistochemical stain was performed at an outside institution revealing positive staining in the tumor cells.    12/15/2018 - 11/30/2019 Chemotherapy   First line chemo cisplatin and gemcitabine every 2 weeks on 12/15/18. Had 1 month chemo break in April, restarted on 04/20/19. Stopped 11/30/19 due to disease progression.    01/26/2019 -  Chemotherapy   Zometa q72month starting 01/26/19-11/02/19. Switched to monthly Xgeva injections on 04/03/20. Switched to every 3 months from 10/10/20 due to bone pain.    02/20/2019 PET scan   Restaging PET scan:  IMPRESSION: 1. Positive response to therapy at all primary and metastatic sites. Persistent primary and metastatic lesions do have intense hypermetabolic activity albeit reduced. 2. Decrease in size and hypermetabolic activity of mass lesion in the gallbladder fundus. 3. Interval decrease in size and metabolic activity of periportal adenopathy. 4. Decrease in size and metabolic activity of periaortic lymph nodes. 5. Interval decrease in size and metabolic activity of  destructive lesion in the LEFT inferior pubic ramus with new sclerosis. 6. No new or progressive disease.   05/29/2019 Imaging   CT CAP W Contrast  IMPRESSION: Known gallbladder adenocarcinoma, difficult to compare to recent PET, improved from prior CT.  Upper abdominal/retroperitoneal lymphadenopathy, slightly improved from prior PET.  Osseous metastasis involving the left inferior pubic ramus, grossly unchanged.  No evidence of metastatic disease in the chest.  Additional ancillary findings as above.   08/22/2019 Imaging   CT CAP W Contrast  IMPRESSION: 1. Stable soft tissue mass involving the gallbladder fundus. 2. Stable mild porta hepatis and retroperitoneal lymphadenopathy. 3. Stable sclerotic bone metastases. 4. No new or progressive metastatic disease identified. 5. Stable 2.3 cm left thyroid lobe nodule.   11/14/2019 Imaging   CT Left Hip WO  contrast  IMPRESSION: 1. 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 Associated soft tissue components have progressed, but there is no evidence of pathologic fracture. 2. No acute osseous findings. 3. 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   12/11/2019 PET scan   IMPRESSION: 1. Significant progression of malignancy, with increased activity in the gallbladder mass; considerable increase in size and activity of porta hepatis, retroperitoneal, and upper pelvic adenopathy; significant increase in size and metabolic activity of the left ischial metastatic lesion with extensive extraosseous component; and new mildly hypermetabolic left supraclavicular and thoracic periaortic lymph nodes suspicious for malignant involvement. 2. Faintly increased activity in a left thyroid nodule which was not previously hypermetabolic. Given the relatively low-grade activity, this may be amenable to surveillance, but a significant minority of thyroid nodules with hypermetabolic activity can represent  thyroid cancer. 3. Other imaging findings of potential clinical significance: Chronic paranasal sinusitis. Chronic right middle lobe atelectasis. Aortic Atherosclerosis (ICD10-I70.0). Coronary atherosclerosis with cardiomegaly.   12/17/2019 - 01/07/2020 Radiation Therapy   Palliative Radiation to left hip with Dr. Lisbeth Renshaw 12/17/19-01/07/20   01/10/2020 - 04/28/2020 Chemotherapy   Xeloda 1500 mg BID 1 week on/1 week off starting on 01/10/20, she tolerated poorly and developed n/v, weakness, and low po intake. She was instructed to dose reduce to 500 mg BID which she continued into C2. Increased to 1085m Am and 5062mPM on 02/12/20 and increased to 1000104mID starting 02/26/20 starting with C4. Last dose taken 04/28/20 for this regimen   04/30/2020 PET scan   IMPRESSION: 1. Increase in size of FDG avid gallbladder tumor. There is also been mild increase in size of FDG avid abdominal nodal metastases. Additionally, there is a new left-sided posterior mediastinal paravertebral FDG avid nodal metastases. 2. Similar appearance of small FDG avid left supraclavicular and subcarinal nodal metastases. Mild decrease in size of large sclerotic metastasis with soft tissue component involving the left acetabulum. 3.  Aortic Atherosclerosis (ICD10-I70.0).   05/06/2020 - 10/29/2020 Chemotherapy   Second-line CAPOX q2weeks with Xeloda 1000m1m the AM and 500mg62mthe PM 1 week on/1 week off on 05/06/20 and Oxaliplatin starting 05/07/20. Due to poor toleration with n/v/d, we dose reduced Oxaliplatin to 40mg/4mtarting with C2 on 05/22/20. D/c due to disease progression. Last Xeloda taken 10/29/20   07/24/2020 PET scan   IMPRESSION: 1. Enlarging gallbladder mass invading the liver with interval slight increased FDG uptake. 2. Slightly improved adenopathy as detailed above. 3. No new sites of metastatic disease are identified in the chest or abdomen. 4. Stable appearing extensive sclerotic lesion involving the  left acetabulum. No new sites of osseous disease.   10/20/2020 PET scan   IMPRESSION: 1.  Enlarged hepatic mass with mildly worsened adenopathy in the chest, abdomen, and upper pelvis. 2. Similar to minimally increased metastatic lesion of the left ischium. 3. Other imaging findings of potential clinical significance: Aortic Atherosclerosis (ICD10-I70.0). Coronary atherosclerosis. Acute on chronic left sphenoid sinusitis with mild chronic right frontal sinusitis. Uterine fibroids.      Chemotherapy   PENDING Third-line Stivarga 3 weeks on/1 week off. Titrate up 30m for first week, 840mthe second week then 12023mhe last week       CURRENT THERAPY:  -Zometa q3mo65monthrting 01/26/19-11/02/19. Switched to monthly Xgeva injectionson 04/03/20.Switched to every 3 months from 10/10/20 due to bone pain.  -Second-line CAPOX q2weeks with Xeloda 1000mg74mthe AM and 500mg 5mhe PM 1 week on/1 week offon 05/06/20 and Oxaliplatin starting 05/07/20.Due to poor toleration with n/v/d, we dose reduced Oxaliplatin to 40mg/m69marting with C2 on 05/22/20. D/c 10/29/20 due to disease progression.  -PENDING Third-line Stivarga 3 weeks on/1 week off. Titrate up 40mg fo89mrst week, 80mg the42mond week then 120mg the 30m week   INTERVAL HISTORY:  Cassie JMOLLEE NEERor a follow up. She identified herself by birth date. She notes she tolerated last cycle well. She has 1 more week on Xeloda in her pills. She notes her Xgeva injections lead to bone pain and lasts longer now.  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  Behavioral/Psych: Mood is stable, no new changes  All other systems were reviewed with the patient and are negative.  MEDICAL HISTORY:  Past Medical History:  Diagnosis Date  . Allergic rhinitis   . Arthritis   . Asthma    xolair s 8/05 ?11/07; mastered hfa 12/20/08  . Benign positional vertigo   . CAD (coronary artery disease)    a. 09/2014 NSTEMI s/p LHC with sig 2V dz. dLAD diffusely diseased and not suitable for PCI. unsuccessful RCA PCI d/t heavy calcifications  . gallbladder ca dx'd 10/2018  . GERD (gastroesophageal reflux disease)   . Heart attack (HCC)    10[place]15  . Hyperlipidemia    <130 ldl pos fm hx, bp  . Hypertension   . Osteopenia    dexa 08/22/07 AP spine + 1.1, left femur -1.3, right femur -.8; dexa 10/06/09 +1.6, left femur =1.6, right femur -.  . PONV (postoperative nausea and vomiting)   . Ruptured disc, cervical   . 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 BREAST SURGERY  10-26-10   Rt. breast bx--for microcalcifications in rt. retroareolar region--dx was hyalinized fibroadenoma  . BUNIONECTOMY Bilateral   . CARDIAC CATHETERIZATION     2015  . CATARACT EXTRACTION Right 02/02/2018   Dr Tanner  . Satira SarkCT EXTRACTION Left 03/30/2018  [place]  . CORONARY ARTERY BYPASS GRAFT N/A 10/04/2018   Procedure: CORONARY ARTERY BYPASS GRAFTING (CABG) times 2 using left  Internal mammary artery to LAD, left greater saphenous vein - open  harvest.;  Surgeon: [name], MD;  Location: [facility];  Service: Open Heart Surgery;  Laterality: N/A;  . IR IMAGING GUIDED PORT INSERTION  12/08/2018  . LEFT HEART CATH AND CORONARY ANGIOGRAPHY N/A 09/27/2018   Procedure: LEFT HEART CATH AND CORONARY  ANGIOGRAPHY;  Surgeon: Troy Sine, MD;  Location: [facility];  Service: Cardiovascular;  Laterality: N/A;  . LEFT HEART CATHETERIZATION WITH CORONARY ANGIOGRAM N/A 10/16/2014   Procedure: LEFT HEART CATHETERIZATION WITH CORONARY ANGIOGRAM;  Surgeon: Blane Ohara, MD;  Location[facility] C[facility] LAB;  Service: Cardiovascular;  Laterality: N/A;  . Left L4-5 transforaminal lumbar interbody fusion with Depuy cage, rods and screws, local and allograft bone graft, Vivigen; bilateral decompression/partial hemilaminectomy lumbar five-sacral one  07/2017  . PERCUTANEOUS CORONARY ROTOBLATOR INTERVENTION (PCI-R) N/A 10/17/2014   Procedure: PERCUTANEOUS CORONARY ROTOBLATOR INTERVENTION (PCI-R);  Surgeon: Troy Sine, MD;  Location: [facility] CATH LAB;  Service: Cardiovascular;  Laterality: N/A;  . TEE WITHOUT CARDIOVERSION N/A 10/04/2018   Procedure: TRANSESOPHAGEAL ECHOCARDIOGRAM (TEE);  Surgeon: [name], MD;  Location: [place];  Service: Open Heart Surgery;  Laterality: N/A;  . VEIN LIGATION AND STRIPPING Left   . VIDEO BRONCHOSCOPY Bilateral 02/05/2015   Procedure: VIDEO BRONCHOSCOPY WITHOUT FLUORO;  Surgeon: Tanda Rockers, MD;  Location: [facility]OS[facility];  Service: Endoscopy;  Laterality: Bilateral;  . VULVA /PERINEUM BIOPSY  12-30-10   --epidermoid cyst    I have reviewed the social history and family history with the patient and they are unchanged from previous note.  ALLERGIES:  is allergic to fish-derived products, penicillins, aspirin, brilinta [ticagrelor], codeine phosphate, and diphenhydramine hcl.  MEDICATIONS:  Current Outpatient Medications  Medication Sig Dispense Refill  . albuterol (VENTOLIN HFA) 108 (90 Base) MCG/ACT inhaler INHALE 1 TO 2 PUFFS BY MOUTH EVERY 4 HOURS AS NEEDED FOR WHEEZING 8.5 g 0  . atorvastatin (LIPITOR) 40 MG tablet Take 1 tablet (40 mg total) by mouth daily. 90 tablet 3  . bisoprolol (ZEBETA) 10 MG tablet TAKE 1 TABLET(10 MG) BY MOUTH DAILY 90 tablet 2   . budesonide-formoterol (SYMBICORT) 160-4.5 MCG/ACT inhaler INHALE 2 PUFFS BY MOUTH EVERY 12 HOURS 30.6 g 3  . capecitabine (XELODA) 500 MG tablet Take 2 tablets in the am and 1 tablet in the pm.  7 days on 7 days off 42 tablet 1  . cetirizine (ZYRTEC) 10 MG tablet Take 10 mg by mouth at bedtime as needed for allergies.    . cholecalciferol 2000 units TABS Take 1 tablet (2,000 Units total) by mouth daily. 30 tablet 3  . clopidogrel (PLAVIX) 75 MG tablet TAKE 1 TABLET(75 MG) BY MOUTH DAILY 90 tablet 3  . dextromethorphan-guaiFENesin (MUCINEX DM) 30-600 MG per 12 hr tablet Take 1-2 tablets by mouth every 12 (twelve) hours as needed for cough (with flutter).     . famotidine (PEPCID) 20 MG tablet Take 20 mg by mouth as needed.     . ferrous sulfate 325 (65 FE) MG tablet Take 650 mg by mouth daily with breakfast.     . fluticasone (FLONASE) 50 MCG/ACT nasal spray Place 1-2 sprays into both nostrils 2 (two) times daily as needed for allergies or rhinitis.    Marland Kitchen lidocaine-prilocaine (EMLA) cream APPLY EXTERNALLY TO THE AFFECTED AREA ONCE DAILY AS DIRECTED 30 g 3  . magic mouthwash SOLN Take 5 mLs by mouth 4 (four) times daily. Leave Benadryl out of compound since pt allergic to it. Use hydrocortisone & nystatin. Swish & swallow  or spit for thrush 240 mL 1  . magnesium 30 MG tablet Take 1 tablet (30 mg total) by mouth 2 (two) times daily. 30 tablet 0  . magnesium oxide (MAG-OX) 400 (241.3 Mg) MG tablet TAKE 1 TABLET(400 MG) BY MOUTH THREE TIMES DAILY 90 tablet 2  . meclizine (ANTIVERT) 25 MG tablet TAKE 2 TABLETS BY MOUTH THREE TIMES DAILY AS NEEDED 24 tablet 2  . megestrol (MEGACE ES) 625 MG/5ML suspension Take 5 mLs (625 mg total) by mouth daily. 150 mL 0  . montelukast (SINGULAIR) 10 MG tablet TAKE 1 TABLET(10 MG) BY MOUTH AT BEDTIME 90 tablet 1  . Multiple Vitamin (MULTIVITAMIN) capsule Take 1 capsule by mouth daily.     . ondansetron (ZOFRAN) 8 MG tablet Take 1 tablet (8 mg total) by mouth every 8  (eight) hours as needed for nausea or vomiting. 30 tablet 1  . oxyCODONE (OXY IR/ROXICODONE) 5 MG immediate release tablet Take 1 tablet (5 mg total) by mouth every 8 (eight) hours as needed for severe pain. 45 tablet 0  . sacubitril-valsartan (ENTRESTO) 24-26 MG Take 1 tablet by mouth 2 (two) times daily. 180 tablet 3   No current facility-administered medications for this visit.    PHYSICAL EXAMINATION: ECOG PERFORMANCE STATUS: 1 - Symptomatic but completely ambulatory  No vitals taken today, Exam not performed today   LABORATORY DATA:  I have reviewed the data as listed CBC Latest Ref Rng & Units 10/10/2020 09/25/2020 09/11/2020  WBC 4.0 - 10.5 K/uL 6.7 6.5 6.1  Hemoglobin 12.0 - 15.0 g/dL 10.9(L) 11.7(L) 11.4(L)  Hematocrit 36 - 46 % 33.2(L) 35.0(L) 34.2(L)  Platelets 150 - 400 K/uL 282 287 308     CMP Latest Ref Rng & Units 10/10/2020 09/25/2020 09/11/2020  Glucose 70 - 99 mg/dL 101(H) 108(H) 98  BUN 8 - 23 mg/dL _0 Creatinine 0.44 - 1.00 mg/dL 1.00 1.01(H) 0.91  Sodium 135 - 145 mmol/L 138 140 138  Potassium 3.5 - 5.1 mmol/L 3.9 4.5 4.2  Chloride 98 - 111 mmol/L 106 106 108  CO2 22 - 32 mmol/L _1 Calcium 8.9 - 10.3 mg/dL 9.9 10.6(H) 9.9  Total Protein 6.5 - 8.1 g/dL 7.9 8.1 8.0  Total Bilirubin 0.3 - 1.2 mg/dL 0.7 0.7 0.6  Alkaline Phos 38 - 126 U/L 65 64 70  AST 15 - 41 U/L 35 34 34  ALT 0 - 44 U/L _2 RADIOGRAPHIC STUDIES: I have personally reviewed the radiological images as listed and agreed with the findings in the report. No results found.   ASSESSMENT & PLAN:  Cassie Campbell is a 79 y.o. 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 -She is also onZometa q49monthstarting 01/26/19.  Switched to monthly Xgeva on 04/03/20 due to kidney function.Given increased bone pain, will reduce to every 3 months from 10/10/20, due to bone pain.  -She progressed on second-line Xeloda based on 04/2020 PET after 4-5 months of treatment.  -She is currently on CAPOX q2weeks withXeloda10075mam and 50059mm,1 week on/1 week offsince5/18/21 and Oxaliplatin starting 05/07/20.She is on low dose, tolerating well.  -We discussed her PET scan from 10/20/20 which shows Enlarged hepatic mass from invading primary tumor with mildly worsened adenopathy in the chest,  abdomen, and upper pelvis and Similar to minimally increased metastatic lesion of the left ischium. Overall she has disease progression and her current regimen is no longer controlling her disease.  -I discussed next line therapy with oral biological agent Stivarga or anti-HER2 treatment with oral Tucatinib or IV Kadcyla given her HER2 amplification. I reviewed side effect with her. Anti-HER2 treatment can cause effects on her heart and given her CAD, will have to monitor with repeat Echo. I discussed FOLFIRI down the road which has more side effects.  -I recommend completing 1 more cycle of CAPOX from tomorrow and in 2 weeks start with Stivarga for now. If cleared by Cardiology in January, we will consider Tucatinib as next line treatment. She is agreeable.  -I reviewed titrating up Stivarga by first week at 52m, second week at 830mand third week at 12053maily. Will continue 3 weeks on/1 week off at 120m16m tolerable.  -F/u in 2 weeks   2.CAD, S/P CABGX2 on 09/27/2018, EF25-30% -Currently on Plavix.  -f/u with Dr. KellClaiborne Billings. Asthma, dyspnea -Currently on Symbicort, Singulair, and albuterol as needed -f/u with pulmonary, stable, no recent flare. Stable.   4.Goal of care discussion -The patient understands the goal of care is palliative. -she is full code for now  5. Mild Anemia -Secondary to chemoandheropen heart  surgery -I discussed her anemia may contribute to her mild SOB. -Previously normal, but mildly low lately.Stable  6. Epigastric pain  -For her Acid reflux she was switched from Protonix to Pepcid due to Protonix drug interaction with Xeloda.  -She has not required Oxycodone yet. Stable   Plan -reviewed PETscan which shows disease progression  -Proceed with Oxaliplatin tomorrow and start her final cycle 1 week Xeloda at same dose.  -I called in StivSt. [place]ay. Plan to start first week at 40mg5mcond week at 80mg 31mthird week at 120mg f58mirst cycle  -f/u in 2 weeks  -Copy note to Dr Kelly  Claiborne Billingsroblem-specific AssessmHunnewellfound for this encounter.   No orders of the defined types were placed in this encounter.  I discussed the assessment and treatment plan with the patient. The patient was provided an opportunity to ask questions and all were answered. The patient agreed with the plan and demonstrated an understanding of the instructions.  The patient was advised to call back or seek an in-person evaluation if the symptoms worsen or if the condition fails to improve as anticipated.  The total time spent in the appointment was 30 minutes.    Lariza Cothron FenTruitt Merle/02/2020   I, Amoya BJoslyn Devonting as scribe for Shereka Lafortune FenTruitt Merle I have reviewed the above documentation for accuracy and completeness, and I agree with the above.

## 2020-10-23 ENCOUNTER — Inpatient Hospital Stay: Payer: Medicare Other

## 2020-10-23 ENCOUNTER — Telehealth: Payer: Self-pay | Admitting: Hematology

## 2020-10-23 ENCOUNTER — Other Ambulatory Visit: Payer: Self-pay

## 2020-10-23 ENCOUNTER — Telehealth: Payer: Self-pay

## 2020-10-23 ENCOUNTER — Ambulatory Visit: Payer: Medicare Other | Admitting: Nurse Practitioner

## 2020-10-23 ENCOUNTER — Telehealth: Payer: Self-pay | Admitting: Pharmacist

## 2020-10-23 VITALS — BP 145/77 | HR 63 | Temp 97.9°F | Resp 18 | Wt 114.2 lb

## 2020-10-23 DIAGNOSIS — D6481 Anemia due to antineoplastic chemotherapy: Secondary | ICD-10-CM | POA: Diagnosis not present

## 2020-10-23 DIAGNOSIS — C23 Malignant neoplasm of gallbladder: Secondary | ICD-10-CM

## 2020-10-23 DIAGNOSIS — Z7189 Other specified counseling: Secondary | ICD-10-CM

## 2020-10-23 DIAGNOSIS — Z452 Encounter for adjustment and management of vascular access device: Secondary | ICD-10-CM | POA: Diagnosis not present

## 2020-10-23 DIAGNOSIS — I2510 Atherosclerotic heart disease of native coronary artery without angina pectoris: Secondary | ICD-10-CM | POA: Diagnosis not present

## 2020-10-23 DIAGNOSIS — J45909 Unspecified asthma, uncomplicated: Secondary | ICD-10-CM | POA: Diagnosis not present

## 2020-10-23 DIAGNOSIS — C7951 Secondary malignant neoplasm of bone: Secondary | ICD-10-CM | POA: Diagnosis not present

## 2020-10-23 DIAGNOSIS — C771 Secondary and unspecified malignant neoplasm of intrathoracic lymph nodes: Secondary | ICD-10-CM | POA: Diagnosis not present

## 2020-10-23 DIAGNOSIS — Z5111 Encounter for antineoplastic chemotherapy: Secondary | ICD-10-CM | POA: Diagnosis present

## 2020-10-23 LAB — CBC WITH DIFFERENTIAL (CANCER CENTER ONLY)
Abs Immature Granulocytes: 0.04 10*3/uL (ref 0.00–0.07)
Basophils Absolute: 0 10*3/uL (ref 0.0–0.1)
Basophils Relative: 1 %
Eosinophils Absolute: 0.4 10*3/uL (ref 0.0–0.5)
Eosinophils Relative: 7 %
HCT: 31.5 % — ABNORMAL LOW (ref 36.0–46.0)
Hemoglobin: 10.6 g/dL — ABNORMAL LOW (ref 12.0–15.0)
Immature Granulocytes: 1 %
Lymphocytes Relative: 9 %
Lymphs Abs: 0.5 10*3/uL — ABNORMAL LOW (ref 0.7–4.0)
MCH: 33.5 pg (ref 26.0–34.0)
MCHC: 33.7 g/dL (ref 30.0–36.0)
MCV: 99.7 fL (ref 80.0–100.0)
Monocytes Absolute: 0.6 10*3/uL (ref 0.1–1.0)
Monocytes Relative: 10 %
Neutro Abs: 4.5 10*3/uL (ref 1.7–7.7)
Neutrophils Relative %: 72 %
Platelet Count: 313 10*3/uL (ref 150–400)
RBC: 3.16 MIL/uL — ABNORMAL LOW (ref 3.87–5.11)
RDW: 18.2 % — ABNORMAL HIGH (ref 11.5–15.5)
WBC Count: 6.1 10*3/uL (ref 4.0–10.5)
nRBC: 0 % (ref 0.0–0.2)

## 2020-10-23 LAB — CMP (CANCER CENTER ONLY)
ALT: 23 U/L (ref 0–44)
AST: 39 U/L (ref 15–41)
Albumin: 3.2 g/dL — ABNORMAL LOW (ref 3.5–5.0)
Alkaline Phosphatase: 80 U/L (ref 38–126)
Anion gap: 6 (ref 5–15)
BUN: 12 mg/dL (ref 8–23)
CO2: 29 mmol/L (ref 22–32)
Calcium: 9.7 mg/dL (ref 8.9–10.3)
Chloride: 104 mmol/L (ref 98–111)
Creatinine: 0.95 mg/dL (ref 0.44–1.00)
GFR, Estimated: >60 mL/min (ref >60)
Glucose, Bld: 113 mg/dL — ABNORMAL HIGH (ref 70–99)
Potassium: 3.8 mmol/L (ref 3.5–5.1)
Sodium: 139 mmol/L (ref 135–145)
Total Bilirubin: 0.6 mg/dL (ref 0.3–1.2)
Total Protein: 7.4 g/dL (ref 6.5–8.1)

## 2020-10-23 MED ORDER — SODIUM CHLORIDE 0.9 % IV SOLN
10.0000 mg | Freq: Once | INTRAVENOUS | Status: AC
  Administered 2020-10-23: 10 mg via INTRAVENOUS
  Filled 2020-10-23: qty 10

## 2020-10-23 MED ORDER — PALONOSETRON HCL INJECTION 0.25 MG/5ML
INTRAVENOUS | Status: AC
  Filled 2020-10-23: qty 5

## 2020-10-23 MED ORDER — PALONOSETRON HCL INJECTION 0.25 MG/5ML
0.2500 mg | Freq: Once | INTRAVENOUS | Status: AC
  Administered 2020-10-23: 0.25 mg via INTRAVENOUS

## 2020-10-23 MED ORDER — DEXTROSE 5 % IV SOLN
Freq: Once | INTRAVENOUS | Status: AC
  Filled 2020-10-23: qty 250

## 2020-10-23 MED ORDER — SODIUM CHLORIDE 0.9% FLUSH
10.0000 mL | Freq: Once | INTRAVENOUS | Status: AC | PRN
  Administered 2020-10-23: 10 mL
  Filled 2020-10-23: qty 10

## 2020-10-23 MED ORDER — HEPARIN SOD (PORK) LOCK FLUSH 100 UNIT/ML IV SOLN
500.0000 [IU] | Freq: Once | INTRAVENOUS | Status: AC | PRN
  Administered 2020-10-23: 500 [IU]
  Filled 2020-10-23: qty 5

## 2020-10-23 MED ORDER — OXALIPLATIN CHEMO INJECTION 100 MG/20ML
30.0000 mg/m2 | Freq: Once | INTRAVENOUS | Status: AC
  Administered 2020-10-23: 45 mg via INTRAVENOUS
  Filled 2020-10-23: qty 9

## 2020-10-23 MED ORDER — SODIUM CHLORIDE 0.9% FLUSH
10.0000 mL | INTRAVENOUS | Status: DC | PRN
  Administered 2020-10-23: 10 mL
  Filled 2020-10-23: qty 10

## 2020-10-23 NOTE — Telephone Encounter (Signed)
Scheduled per 11/3 los. Messaged RN Seth Bake to give pt appt calendar.

## 2020-10-23 NOTE — Telephone Encounter (Signed)
Oral Oncology Pharmacist Encounter  Received new prescription for Stivarga (regorafenib) for the treatment of stage IVB gallbladder cancer, planned duration until disease progression or unacceptable drug toxicity.  Prescription dose and frequency assessed for appropriateness. Patient will be dose escalated to increase tolerance per MD.   CMP and CBC w/ Diff from 10/23/20 assessed, labs OK for treatment initiation - plan start in 2 weeks.  Current medication list in Epic reviewed, no relevant/significant DDIs with Stivarga identified.  Evaluated chart and no patient barriers to medication adherence noted.   Prescription has been e-scribed to the [facility] for benefits analysis and approval.  Oral Oncology Clinic will continue to follow for insurance authorization, copayment issues, initial counseling and start date.  Leron Croak, PharmD, BCPS Hematology/Oncology Clinical Pharmacist [facility] [phone] 10/23/2020 9:00 AM

## 2020-10-23 NOTE — Patient Instructions (Signed)
[facility] Discharge Instructions for Patients Receiving Chemotherapy  Today you received the following chemotherapy agents: Oxaliplatin  To help prevent nausea and vomiting after your treatment, we encourage you to take your nausea medication as directed.   If you develop nausea and vomiting that is not controlled by your nausea medication, call the clinic.   BELOW ARE SYMPTOMS THAT SHOULD BE REPORTED IMMEDIATELY:  *FEVER GREATER THAN 100.5 F  *CHILLS WITH OR WITHOUT FEVER  NAUSEA AND VOMITING THAT IS NOT CONTROLLED WITH YOUR NAUSEA MEDICATION  *UNUSUAL SHORTNESS OF BREATH  *UNUSUAL BRUISING OR BLEEDING  TENDERNESS IN MOUTH AND THROAT WITH OR WITHOUT PRESENCE OF ULCERS  *URINARY PROBLEMS  *BOWEL PROBLEMS  UNUSUAL RASH Items with * indicate a potential emergency and should be followed up as soon as possible.  Feel free to call the clinic should you have any questions or concerns. The clinic phone number is [phone].  Please show the CHEMO ALERT CARD at check-in to the Emergency Department and triage nurse.   

## 2020-10-23 NOTE — Telephone Encounter (Signed)
Oral Oncology Patient Advocate Encounter  Received notification from [facility] that prior authorization for Stivarga is required.  PA submitted on CoverMyMeds Key [id number] Status is pending  Oral Oncology Clinic will continue to follow.  [place] Patient [place] Phone [phone] Fax [phone] 10/23/2020 8:39 AM

## 2020-10-25 LAB — CANCER ANTIGEN 19-9: CA 19-9: 5021 U/mL — ABNORMAL HIGH (ref 0–35)

## 2020-11-01 ENCOUNTER — Other Ambulatory Visit: Payer: Self-pay | Admitting: Hematology

## 2020-11-03 ENCOUNTER — Other Ambulatory Visit: Payer: Self-pay

## 2020-11-04 ENCOUNTER — Other Ambulatory Visit: Payer: Self-pay | Admitting: Hematology

## 2020-11-04 MED ORDER — REGORAFENIB 40 MG PO TABS: ORAL_TABLET | ORAL | 0 refills | Status: DC

## 2020-11-04 NOTE — Telephone Encounter (Signed)
Oral Oncology Patient Advocate Encounter  Prior Authorization for Cassie Campbell has been approved.    PA# [id number] Effective dates: 10/23/20 through 12/19/21  Patients co-pay is $0  Oral Oncology Clinic will continue to follow.   [place] Patient [name] Phone [phone] Fax [phone] 11/04/2020 12:01 PM

## 2020-11-04 NOTE — Telephone Encounter (Signed)
Oral Chemotherapy Pharmacist Encounter  I spoke with patient for overview of: Stivarga (regorafenib) for the treatment of stage IVB gallbladder cancer, planned duration until disease progression or unacceptable toxicity.   Counseled patient on administration, dosing, side effects, monitoring, drug-food interactions, safe handling, storage, and disposal.  Patient's Stivarga dose will be initiated on a dose up-titration schedule.  For week 1: Patient will take Stivarga 40mg  tablets, 1 tablet (40mg ) by mouth once daily with a glass of water and a low fat meal of <600 calories, and <30% fat content.  If tolerated, for week 2: Patient will take Stivarga 40mg  tablets, 2 tablets (80mg ) by mouth once daily with a glass of water and a low fat meal of <600 calories, and <30% fat content.  If tolerated, for week 3: Patient will take Stivarga 40mg  tablets, 3 tablets (120mg ) by mouth once daily with a glass of water and a low fat meal of <600 calories, and <30% fat content.  Patient will be off Stivarga for week 4.  Dosing for subsequent cycles (3 weeks on, 1 week off, repeated every 4 weeks) will be determined based on toleration of cycle 1.  Patient knows to avoid grapefruit and grapefruit juice while on therapy with Stivarga.  Stivarga start date: 11/10/2020  Adverse effects include but are not limited to: hand-foot syndrome, diarrhea, nausea, abdominal pain, musculoskeletal pain, fatigue, hypertension, delayed wound healing, decreased blood counts, and hepatotoxicity.    Patient has anti-emetic on hand and knows to take it if nausea develops.   Patient will obtain anti diarrheal and alert the office of 4 or more loose stools above baseline.  Reviewed with patient importance of keeping a medication schedule and plan for any missed doses. No barriers to medication adherence identified.  Stivarga should be discontinued 2 weeks prior to any planned surgery and resumed postsurgery based on clinical  judgement of wound healing.  Medication reconciliation performed and medication/allergy list updated.  Insurance authorization for Marylyn Ishihara has been obtained. Test claim at the pharmacy revealed copayment $0 for 1st fill of Stivarga. Patient will pick up medication from the [facility] on 11/05/2020  Patient informed the pharmacy will reach out 5-7 days prior to needing next fill of Stivarga to coordinate continued medication acquisition to prevent break in therapy.  All questions answered.  Ms. Braaten voiced understanding and appreciation.   Medication education handout and medication calendar placed in mail for patient. Patient knows to call the office with questions or concerns. Oral Chemotherapy Clinic phone number provided to patient.   Leron Croak, PharmD, BCPS Hematology/Oncology Clinical Pharmacist [facility] [phone] 11/04/2020 10:24 AM

## 2020-11-05 MED FILL — STIVARGA 40 MG TABS: 40 | 28 days supply | Qty: 42 | Fill #0

## 2020-11-07 ENCOUNTER — Ambulatory Visit: Payer: Medicare Other

## 2020-11-07 ENCOUNTER — Other Ambulatory Visit: Payer: Medicare Other

## 2020-11-07 ENCOUNTER — Ambulatory Visit: Payer: Medicare Other | Admitting: Hematology

## 2020-11-07 NOTE — Progress Notes (Signed)
[place]   Telephone:(336) [phone] Fax:(336) [phone]   Clinic Follow up Note   Patient Care Team: Tanda Rockers, MD as PCP - General (Pulmonary Disease) Troy Sine, MD as PCP - Cardiology (Cardiology)  Date of Service:  11/10/2020  CHIEF COMPLAINT: F/u on metastatic gallbladder cancer  SUMMARY OF ONCOLOGIC HISTORY: Oncology History Overview Note  Cancer Staging Gallbladder cancer [facility]) Staging form: Gallbladder, AJCC 8th Edition - Clinical stage from 11/13/2018: Stage IVB (cTX, cN2, pM1) - Signed by Truitt Merle, MD on 12/15/2018     Gallbladder cancer ([place])  10/04/2018 Pathology Results   10/04/2018 Surgical Pathology Diagnosis 1. Lymph node, biopsy, mediastinal - LYMPH NODE WITH METASTATIC ADENOCARCINOMA. - SEE MICROSCOPIC DESCRIPTION 2. Plaque, coronary artery - CALCIFIED ATHEROSCLEROTIC PLAQUE.   10/14/2018 Miscellaneous   Foundation One:  MSI stable tumor mutation burden 3Muts/mb ERBB2 amplification CCNE1 amplification TP53 mutation(+) CDK6 amplification  HGF amplification    11/13/2018 Cancer Staging   Staging form: Gallbladder, AJCC 8th Edition - Clinical stage from 11/13/2018: Stage IVB (cTX, cN2, pM1) - Signed by Truitt Merle, MD on 12/15/2018   11/17/2018 Imaging   11/17/2018 CT CAP IMPRESSION: 1. 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 This is associated with extensive upper abdominal and retroperitoneal lymphadenopathy, as well as metastatic lymphadenopathy in the posterior mediastinum and left supraclavicular region. There is also a metastatic lesion to the left ischium. 2. 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 3. Aortic atherosclerosis, in addition to left main and 3 vessel coronary artery disease. Status post median sternotomy for CABG including LIMA to the LAD. 4. There are calcifications of the aortic valve. Echocardiographic correlation for  evaluation of potential valvular dysfunction may be warranted if clinically indicated.    11/21/2018 Initial Diagnosis   Gallbladder cancer ([place])   11/27/2018 Pathology Results   11/27/2018 CA19-9 immunohistochemical stain Per request, a CA19-9 immunohistochemical stain was performed at an outside institution revealing positive staining in the tumor cells.    12/15/2018 - 11/30/2019 Chemotherapy   First line chemo cisplatin and gemcitabine every 2 weeks on 12/15/18. Had 1 month chemo break in April, restarted on 04/20/19. Stopped 11/30/19 due to disease progression.    01/26/2019 -  Chemotherapy   Zometa q10month starting 01/26/19-11/02/19. Switched to monthly Xgeva injections on 04/03/20. Switched to every 3 months from 10/10/20 due to bone pain.    02/20/2019 PET scan   Restaging PET scan:  IMPRESSION: 1. Positive response to therapy at all primary and metastatic sites. Persistent primary and metastatic lesions do have intense hypermetabolic activity albeit reduced. 2. Decrease in size and hypermetabolic activity of mass lesion in the gallbladder fundus. 3. Interval decrease in size and metabolic activity of periportal adenopathy. 4. Decrease in size and metabolic activity of periaortic lymph nodes. 5. Interval decrease in size and metabolic activity of destructive lesion in the LEFT inferior pubic ramus with new sclerosis. 6. No new or progressive disease.   05/29/2019 Imaging   CT CAP W Contrast  IMPRESSION: Known gallbladder adenocarcinoma, difficult to compare to recent PET, improved from prior CT.  Upper abdominal/retroperitoneal lymphadenopathy, slightly improved from prior PET.  Osseous metastasis involving the left inferior pubic ramus, grossly unchanged.  No evidence of metastatic disease in the chest.  Additional ancillary findings as above.   08/22/2019 Imaging   CT CAP W Contrast  IMPRESSION: 1. Stable soft tissue mass involving the gallbladder fundus. 2.  Stable mild porta hepatis and retroperitoneal lymphadenopathy. 3.  Stable sclerotic bone metastases. 4. No new or progressive metastatic disease identified. 5. Stable 2.3 cm left thyroid lobe nodule.   11/14/2019 Imaging   CT Left Hip WO  contrast  IMPRESSION: 1. 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 Associated soft tissue components have progressed, but there is no evidence of pathologic fracture. 2. No acute osseous findings. 3. 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   12/11/2019 PET scan   IMPRESSION: 1. Significant progression of malignancy, with increased activity in the gallbladder mass; considerable increase in size and activity of porta hepatis, retroperitoneal, and upper pelvic adenopathy; significant increase in size and metabolic activity of the left ischial metastatic lesion with extensive extraosseous component; and new mildly hypermetabolic left supraclavicular and thoracic periaortic lymph nodes suspicious for malignant involvement. 2. Faintly increased activity in a left thyroid nodule which was not previously hypermetabolic. Given the relatively low-grade activity, this may be amenable to surveillance, but a significant minority of thyroid nodules with hypermetabolic activity can represent thyroid cancer. 3. Other imaging findings of potential clinical significance: Chronic paranasal sinusitis. Chronic right middle lobe atelectasis. Aortic Atherosclerosis (ICD10-I70.0). Coronary atherosclerosis with cardiomegaly.   12/17/2019 - 01/07/2020 Radiation Therapy   Palliative Radiation to left hip with Dr. Lisbeth Renshaw 12/17/19-01/07/20   01/10/2020 - 04/28/2020 Chemotherapy   Xeloda 1500 mg BID 1 week on/1 week off starting on 01/10/20, she tolerated poorly and developed n/v, weakness, and low po intake. She was instructed to dose reduce to 500 mg BID which she continued into C2. Increased to 1010m Am and 5029mPM on 02/12/20 and increased to  100039mID starting 02/26/20 starting with C4. Last dose taken 04/28/20 for this regimen   04/30/2020 PET scan   IMPRESSION: 1. Increase in size of FDG avid gallbladder tumor. There is also been mild increase in size of FDG avid abdominal nodal metastases. Additionally, there is a new left-sided posterior mediastinal paravertebral FDG avid nodal metastases. 2. Similar appearance of small FDG avid left supraclavicular and subcarinal nodal metastases. Mild decrease in size of large sclerotic metastasis with soft tissue component involving the left acetabulum. 3.  Aortic Atherosclerosis (ICD10-I70.0).   05/06/2020 - 10/29/2020 Chemotherapy   Second-line CAPOX q2weeks with Xeloda 1000m46m the AM and 500mg50mthe PM 1 week on/1 week off on 05/06/20 and Oxaliplatin starting 05/07/20. Due to poor toleration with n/v/d, we dose reduced Oxaliplatin to 40mg/63mtarting with C2 on 05/22/20. D/c due to disease progression. Last Xeloda taken 10/29/20   07/24/2020 PET scan   IMPRESSION: 1. Enlarging gallbladder mass invading the liver with interval slight increased FDG uptake. 2. Slightly improved adenopathy as detailed above. 3. No new sites of metastatic disease are identified in the chest or abdomen. 4. Stable appearing extensive sclerotic lesion involving the left acetabulum. No new sites of osseous disease.   10/20/2020 PET scan   IMPRESSION: 1. Enlarged hepatic mass with mildly worsened adenopathy in the chest, abdomen, and upper pelvis. 2. Similar to minimally increased metastatic lesion of the left ischium. 3. Other imaging findings of potential clinical significance: Aortic Atherosclerosis (ICD10-I70.0). Coronary atherosclerosis. Acute on chronic left sphenoid sinusitis with mild chronic right frontal sinusitis. Uterine fibroids.     11/10/2020 -  Chemotherapy   Third-line Stivarga 3 weeks on/1 week off. Titrate up 40mg f76mirst week, 80mg th73mcond week then 120mg the9mt week        CURRENT THERAPY:  -Zometa q3monthsst65month2/7/20-11/13/20. Switched to monthly Xgeva injectionson 04/03/20.Switched to every 3 months from  10/10/20 due to bone pain.  -Third-line Stivarga 3 weeks on/1 week off starting 11/10/20. Titrate up 51m for first week, 856mthe second week then 12037mhe last week  INTERVAL HISTORY:  Cassie Campbell here for a follow up. She presents to the clinic alone. She noes her weight is still trending down and her appetite is low. She has been using Megace which helps. She uses boost 1-2 a day. She notes her only pain is intermittent left hip pain. She started Stivarga this morning with 1 tablet. She notes her only relative is her daughter who is not very dependable. She understands she may need to move to nursing facility if she needs more help.    REVIEW OF SYSTEMS:   Constitutional: Denies fevers, chills or abnormal weight loss Eyes: Denies blurriness of vision Ears, nose, mouth, throat, and face: Denies mucositis or sore throat Respiratory: Denies cough, dyspnea or wheezes Cardiovascular: Denies palpitation, chest discomfort or lower extremity swelling Gastrointestinal:  Denies nausea, heartburn or change in bowel habits Skin: Denies abnormal skin rashes MSK: (+) Left hip pain Lymphatics: Denies new lymphadenopathy or easy bruising Neurological:Denies numbness, tingling or new weaknesses Behavioral/Psych: Mood is stable, no new changes  All other systems were reviewed with the patient and are negative.  MEDICAL HISTORY:  Past Medical History:  Diagnosis Date  . Allergic rhinitis   . Arthritis   . Asthma    xolair s 8/05 ?11/07; mastered hfa 12/20/08  . Benign positional vertigo   . CAD (coronary artery disease)    a. 09/2014 NSTEMI s/p LHC with sig 2V dz. dLAD diffusely diseased and not suitable for PCI. unsuccessful RCA PCI d/t heavy calcifications  . gallbladder ca dx'd 10/2018  . GERD (gastroesophageal reflux disease)   . Heart  attack ([facility]  09/2014  . Hyperlipidemia    <130 ldl pos fm hx, bp  . Hypertension   . Osteopenia    dexa 08/22/07 AP spine + 1.1, left femur -1.3, right femur -.8; dexa 10/06/09 +1.6, left femur =1.6, right femur -.  . PONV (postoperative nausea and vomiting)   . Ruptured disc, cervical   . 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 BREAST SURGERY  10-26-10   Rt. breast bx--for microcalcifications in rt. retroareolar region--dx was hyalinized fibroadenoma  . BUNIONECTOMY Bilateral   . CARDIAC CATHETERIZATION     2015  . CATARACT EXTRACTION Right 02/02/2018   Dr TanSatira Sark CATARACT EXTRACTION Left 03/30/2018  . CERRio LindaRGERY  12/01  . CORONARY ARTERY BYPASS GRAFT N/A 10/04/2018   Procedure: CORONARY ARTERY BYPASS GRAFTING (CABG) times 2 using left  Internal mammary artery to LAD, left greater saphenous vein - open harvest.;  Surgeon: HenMelrose NakayamaD;  [facility];  Laterality: N/A;  . IR IMAGING GUIDED PORT INSERTION  12/08/2018  . LEFT HEART CATH AND CORONARY ANGIOGRAPHY N/A 09/27/2018   Procedure: LEFT HEART CATH AND CORONARY ANGIOGRAPHY;  Surgeon: KelTroy SineD;  Location: [facility] [place] LAB;  Service: Cardiovascular;  Laterality: N/A;  . LEFT HEART CATHETERIZATION WITH CORONARY ANGIOGRAM N/A 10/16/2014   Procedure: LEFT HEART CATHETERIZATION WITH CORONARY ANGIOGRAM;  Surgeon: MicBlane OharaD;  [facility];  Service: Cardiovascular;  Laterality: N/A;  . Left L4-5 transforaminal lumbar interbody fusion with Depuy cage, rods and screws, local and allograft  bone graft, Vivigen; bilateral decompression/partial hemilaminectomy lumbar five-sacral one  07/2017  . PERCUTANEOUS CORONARY ROTOBLATOR INTERVENTION (PCI-R) N/A 10/17/2014   Procedure: PERCUTANEOUS CORONARY ROTOBLATOR INTERVENTION (PCI-R);  Surgeon: Troy Sine,  MD;  [facility];  Service: Cardiovascular;  Laterality: N/A;  . TEE WITHOUT CARDIOVERSION N/A 10/04/2018   Procedure: TRANSESOPHAGEAL ECHOCARDIOGRAM (TEE);  Surgeon: [name], MD;  Location: [place];  Service: Open Heart Surgery;  Laterality: N/A;  . VEIN LIGATION AND STRIPPING Left   . VIDEO BRONCHOSCOPY Bilateral 02/05/2015   Procedure: VIDEO BRONCHOSCOPY WITHOUT FLUORO;  Surgeon: Tanda Rockers, MD;  Location: [facility] ENDOSCOPY;  Service: Endoscopy;  Laterality: Bilateral;  . VULVA /PERINEUM BIOPSY  12-30-10   --epidermoid cyst    I have reviewed the social history and family history with the patient and they are unchanged from previous note.  ALLERGIES:  is allergic to fish-derived products, penicillins, aspirin, brilinta [ticagrelor], codeine phosphate, and diphenhydramine hcl.  MEDICATIONS:  Current Outpatient Medications  Medication Sig Dispense Refill  . albuterol (VENTOLIN HFA) 108 (90 Base) MCG/ACT inhaler INHALE 1 TO 2 PUFFS BY MOUTH EVERY 4 HOURS AS NEEDED FOR WHEEZING 8.5 g 0  . atorvastatin (LIPITOR) 40 MG tablet Take 1 tablet (40 mg total) by mouth daily. 90 tablet 3  . bisoprolol (ZEBETA) 10 MG tablet TAKE 1 TABLET(10 MG) BY MOUTH DAILY 90 tablet 2  . budesonide-formoterol (SYMBICORT) 160-4.5 MCG/ACT inhaler INHALE 2 PUFFS BY MOUTH EVERY 12 HOURS 30.6 g 3  . cetirizine (ZYRTEC) 10 MG tablet Take 10 mg by mouth at bedtime as needed for allergies.    . cholecalciferol 2000 units TABS Take 1 tablet (2,000 Units total) by mouth daily. 30 tablet 3  . clopidogrel (PLAVIX) 75 MG tablet TAKE 1 TABLET(75 MG) BY MOUTH DAILY 90 tablet 3  . dextromethorphan-guaiFENesin (MUCINEX DM) 30-600 MG per 12 hr tablet Take 1-2 tablets by mouth every 12 (twelve) hours as needed for cough (with flutter).     . famotidine (PEPCID) 20 MG tablet Take 20 mg by mouth as needed.     . ferrous sulfate 325 (65 FE) MG tablet Take 650 mg by mouth daily with breakfast.     . fluticasone  (FLONASE) 50 MCG/ACT nasal spray Place 1-2 sprays into both nostrils 2 (two) times daily as needed for allergies or rhinitis.    Marland Kitchen lidocaine-prilocaine (EMLA) cream APPLY EXTERNALLY TO THE AFFECTED AREA ONCE DAILY AS DIRECTED 30 g 3  . magic mouthwash SOLN Take 5 mLs by mouth 4 (four) times daily. Leave Benadryl out of compound since pt allergic to it. Use hydrocortisone & nystatin. Swish & swallow or spit for thrush 240 mL 1  . magnesium 30 MG tablet Take 1 tablet (30 mg total) by mouth 2 (two) times daily. 30 tablet 0  . MAGNESIUM-OXIDE 400 (241.3 Mg) MG tablet TAKE 1 TABLET(400 MG) BY MOUTH THREE TIMES DAILY 90 tablet 2  . meclizine (ANTIVERT) 25 MG tablet TAKE 2 TABLETS BY MOUTH THREE TIMES DAILY AS NEEDED 24 tablet 2  . megestrol (MEGACE ES) 625 MG/5ML suspension Take 5 mLs (625 mg total) by mouth daily. 150 mL 0  . montelukast (SINGULAIR) 10 MG tablet TAKE 1 TABLET(10 MG) BY MOUTH AT BEDTIME 90 tablet 1  . Multiple Vitamin (MULTIVITAMIN) capsule Take 1 capsule by mouth daily.     . ondansetron (ZOFRAN) 8 MG tablet Take 1 tablet (8 mg total) by mouth every 8 (eight) hours as needed for nausea or vomiting. [place]  tablet 1  . oxyCODONE (OXY IR/ROXICODONE) 5 MG immediate release tablet Take 1 tablet (5 mg total) by mouth every 8 (eight) hours as needed for severe pain. 45 tablet 0  . regorafenib (STIVARGA) 40 MG tablet Take 1 tablet by mouth daily for first week, then increase to 2 tabs daily for second week, and then increase to 3 tabs daily for third week if tolerated. Take for 3 weeks on, 1 week off. Take with low fat meal. 42 tablet 0  . sacubitril-valsartan (ENTRESTO) 24-26 MG Take 1 tablet by mouth 2 (two) times daily. 180 tablet 3   No current facility-administered medications for this visit.    PHYSICAL EXAMINATION: ECOG PERFORMANCE STATUS:2  Vitals:   11/10/20 1103  BP: 133/77  Pulse: 82  Resp: 20  Temp: 98.3 F (36.8 C)  SpO2: 100%   Filed Weights   11/10/20 1103  Weight: 111  lb 6.4 oz (50.5 kg)    Due to COVID19 we will limit examination to appearance. Patient had no complaints.  GENERAL:alert, no distress and comfortable SKIN: skin color normal, no rashes or significant lesions EYES: normal, Conjunctiva are pink and non-injected, sclera clear  NEURO: alert & oriented x 3 with fluent speech   LABORATORY DATA:  I have reviewed the data as listed CBC Latest Ref Rng & Units 11/10/2020 10/23/2020 10/10/2020  WBC 4.0 - 10.5 K/uL 6.6 6.1 6.7  Hemoglobin 12.0 - 15.0 g/dL 10.7(L) 10.6(L) 10.9(L)  Hematocrit 36 - 46 % 32.4(L) 31.5(L) 33.2(L)  Platelets 150 - 400 K/uL 350 313 282     CMP Latest Ref Rng & Units 11/10/2020 10/23/2020 10/10/2020  Glucose 70 - 99 mg/dL 122(H) 113(H) 101(H)  BUN 8 - 23 mg/dL 11 12 15   Creatinine 0.44 - 1.00 mg/dL 0.92 0.95 1.00  Sodium 135 - 145 mmol/L 135 139 138  Potassium 3.5 - 5.1 mmol/L 4.3 3.8 3.9  Chloride 98 - 111 mmol/L 102 104 106  CO2 22 - 32 mmol/L 26 29 24   Calcium 8.9 - 10.3 mg/dL 9.2 9.7 9.9  Total Protein 6.5 - 8.1 g/dL 7.8 7.4 7.9  Total Bilirubin 0.3 - 1.2 mg/dL 1.0 0.6 0.7  Alkaline Phos 38 - 126 U/L 81 80 65  AST 15 - 41 U/L 36 39 35  ALT 0 - 44 U/L 13 23 16       RADIOGRAPHIC STUDIES: I have personally reviewed the radiological images as listed and agreed with the findings in the report. No results found.   ASSESSMENT & PLAN:  Cassie Campbell is a 79 y.o. 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 -She is also onZometa q39monthstarting 01/26/19. Switched to monthly Xgeva on 04/03/20 due to kidney function.Given increased bone pain, will reduce to every 3 months from 10/10/20, due to bone pain.  -She progressed on second-line Xeloda based on 04/2020  PET after 4-5 months of treatment and more recent progressed on CAPOX after 5 months of treatment.   -I started her on next-line therapy with Stivarga 3 weeks on/1 week off beginning 11/10/20. She will titrate up with first week at 471m second week at 8068mnd third week at 120m11mily for the first cycle  -F/u in 2 weeks   2.CAD, S/P CABGX2 on 09/27/2018, EF25-30% -Currently on Plavix.  -f/u with  Dr. Claiborne Billings  3. Asthma, dyspnea -Currently on Symbicort, Singulair, and albuterol as needed -f/u with pulmonary, stable, no recent flare. Stable.   4. Mild Anemia -Secondary to chemoandheropen heart surgery -I discussed her anemia may contribute to her mild SOB. -Previously normal, but mildly low lately.Stable  5. Goal of care discussion, Social support , DNR -We again discussed the incurable nature of her cancer, and the overall poor prognosis, especially if she does not have good response to chemotherapy or progress on chemo -The patient understands the goal of care is palliative. -I recommend DNR/DNI. She agrees  -She lives alone. Her only close relative is her daughter, but she is not reliable.  -She may consider moving to nursing home if she needs more help at home. I discussed if she is not able to tolerate treatment will stop. She will be eligible for hospice home care.  -I reviewed [facility] resources available to her.  -She has walker at home if needed. I encouraged her to carry her phone with her at all times.    [place] today at 63m for 1 week, 865mthe second week and 12037mhe third week, then 1 week off.  -Lab and F/u in 2 weeks  -Continue Xgeva every 3 months, next in 12/2020   No problem-specific Assessment & Plan notes found for this encounter.   No orders of the defined types were placed in this encounter.  All questions were answered. The patient knows to call the clinic with any problems, questions or concerns. No barriers to learning was  detected. The total time spent in the appointment was 30 minutes.     YanTruitt MerleD 11/10/2020   I, AmoJoslyn Devonm acting as scribe for YanTruitt MerleD.   I have reviewed the above documentation for accuracy and completeness, and I agree with the above.

## 2020-11-10 ENCOUNTER — Inpatient Hospital Stay: Payer: Medicare Other

## 2020-11-10 ENCOUNTER — Encounter: Payer: Self-pay | Admitting: Hematology

## 2020-11-10 ENCOUNTER — Other Ambulatory Visit: Payer: Self-pay

## 2020-11-10 ENCOUNTER — Inpatient Hospital Stay (HOSPITAL_BASED_OUTPATIENT_CLINIC_OR_DEPARTMENT_OTHER): Payer: Medicare Other | Admitting: Hematology

## 2020-11-10 VITALS — BP 133/77 | HR 82 | Temp 98.3°F | Resp 20 | Ht 59.0 in | Wt 111.4 lb

## 2020-11-10 DIAGNOSIS — C23 Malignant neoplasm of gallbladder: Secondary | ICD-10-CM

## 2020-11-10 DIAGNOSIS — Z5111 Encounter for antineoplastic chemotherapy: Secondary | ICD-10-CM | POA: Diagnosis not present

## 2020-11-10 LAB — CMP (CANCER CENTER ONLY)
ALT: 13 U/L (ref 0–44)
AST: 36 U/L (ref 15–41)
Albumin: 3 g/dL — ABNORMAL LOW (ref 3.5–5.0)
Alkaline Phosphatase: 81 U/L (ref 38–126)
Anion gap: 7 (ref 5–15)
BUN: 11 mg/dL (ref 8–23)
CO2: 26 mmol/L (ref 22–32)
Calcium: 9.2 mg/dL (ref 8.9–10.3)
Chloride: 102 mmol/L (ref 98–111)
Creatinine: 0.92 mg/dL (ref 0.44–1.00)
GFR, Estimated: >60 mL/min (ref >60)
Glucose, Bld: 122 mg/dL — ABNORMAL HIGH (ref 70–99)
Potassium: 4.3 mmol/L (ref 3.5–5.1)
Sodium: 135 mmol/L (ref 135–145)
Total Bilirubin: 1 mg/dL (ref 0.3–1.2)
Total Protein: 7.8 g/dL (ref 6.5–8.1)

## 2020-11-10 LAB — CBC WITH DIFFERENTIAL (CANCER CENTER ONLY)
Abs Immature Granulocytes: 0.01 10*3/uL (ref 0.00–0.07)
Basophils Absolute: 0 10*3/uL (ref 0.0–0.1)
Basophils Relative: 1 %
Eosinophils Absolute: 0.3 10*3/uL (ref 0.0–0.5)
Eosinophils Relative: 4 %
HCT: 32.4 % — ABNORMAL LOW (ref 36.0–46.0)
Hemoglobin: 10.7 g/dL — ABNORMAL LOW (ref 12.0–15.0)
Immature Granulocytes: 0 %
Lymphocytes Relative: 10 %
Lymphs Abs: 0.6 10*3/uL — ABNORMAL LOW (ref 0.7–4.0)
MCH: 33.8 pg (ref 26.0–34.0)
MCHC: 33 g/dL (ref 30.0–36.0)
MCV: 102.2 fL — ABNORMAL HIGH (ref 80.0–100.0)
Monocytes Absolute: 0.7 10*3/uL (ref 0.1–1.0)
Monocytes Relative: 11 %
Neutro Abs: 4.9 10*3/uL (ref 1.7–7.7)
Neutrophils Relative %: 74 %
Platelet Count: 350 10*3/uL (ref 150–400)
RBC: 3.17 MIL/uL — ABNORMAL LOW (ref 3.87–5.11)
RDW: 17.5 % — ABNORMAL HIGH (ref 11.5–15.5)
WBC Count: 6.6 10*3/uL (ref 4.0–10.5)
nRBC: 0 % (ref 0.0–0.2)

## 2020-11-10 LAB — MAGNESIUM: Magnesium: 2.1 mg/dL (ref 1.7–2.4)

## 2020-11-10 MED ORDER — SODIUM CHLORIDE 0.9% FLUSH
10.0000 mL | Freq: Once | INTRAVENOUS | Status: AC | PRN
  Administered 2020-11-10: 10 mL
  Filled 2020-11-10: qty 10

## 2020-11-10 MED ORDER — HEPARIN SOD (PORK) LOCK FLUSH 100 UNIT/ML IV SOLN
500.0000 [IU] | Freq: Once | INTRAVENOUS | Status: AC | PRN
  Administered 2020-11-10: 500 [IU]
  Filled 2020-11-10: qty 5

## 2020-11-19 ENCOUNTER — Ambulatory Visit (HOSPITAL_COMMUNITY): Payer: Medicare Other | Attending: Internal Medicine

## 2020-11-19 ENCOUNTER — Other Ambulatory Visit: Payer: Self-pay

## 2020-11-19 DIAGNOSIS — I2510 Atherosclerotic heart disease of native coronary artery without angina pectoris: Secondary | ICD-10-CM | POA: Insufficient documentation

## 2020-11-19 DIAGNOSIS — I5189 Other ill-defined heart diseases: Secondary | ICD-10-CM | POA: Insufficient documentation

## 2020-11-19 DIAGNOSIS — Z951 Presence of aortocoronary bypass graft: Secondary | ICD-10-CM | POA: Diagnosis not present

## 2020-11-19 DIAGNOSIS — I255 Ischemic cardiomyopathy: Secondary | ICD-10-CM | POA: Insufficient documentation

## 2020-11-19 DIAGNOSIS — I5020 Unspecified systolic (congestive) heart failure: Secondary | ICD-10-CM | POA: Insufficient documentation

## 2020-11-19 LAB — ECHOCARDIOGRAM COMPLETE
Area-P 1/2: 2.2 cm2
S' Lateral: 3.2 cm

## 2020-11-21 ENCOUNTER — Telehealth: Payer: Self-pay

## 2020-11-21 NOTE — Telephone Encounter (Signed)
Cassie Campbell left a vm stating that she has increased the Stivarga to 2 tablets daily and she is feeling weak. She did not take a dose this am.  She states she is to increased to 3 tablets on Monday and she is asking for advice.  I reviewed with Dr Burr Medico.  Cassie Campbell his to hold Stivarga this week end and restart on Monday with 2 tablets daily.  She verbalized understanding.

## 2020-11-25 ENCOUNTER — Telehealth: Payer: Self-pay

## 2020-11-25 NOTE — Telephone Encounter (Signed)
Cassie Campbell called stating she becomes nauseated after eating.  She is taking antiemetic after she gets nauseous. I instructed her to take before she eats.  She also states she is having "diarrhea" 2-3 times per day.  She states her stool is soft loose, not liquid.  She states this occurs after she eats.  She is afebrile.  She states she feels weak.  She has not resume taking Stivarga.  I offered to make her appts with [facility] for evaluation and treatment she declined.  I encouraged her to keep her appts with [place] tomorrow.

## 2020-11-25 NOTE — Progress Notes (Deleted)
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 Lymph node, biopsy, mediastinal - LYMPH NODE WITH METASTATIC ADENOCARCINOMA. - SEE MICROSCOPIC DESCRIPTION 2. Plaque, coronary artery - CALCIFIED ATHEROSCLEROTIC PLAQUE.   10/14/2018 Miscellaneous   Foundation One:  MSI stable tumor mutation burden 3Muts/mb ERBB2 amplification CCNE1 amplification TP53 mutation(+) CDK6 amplification  HGF amplification    11/13/2018 Cancer Staging   Staging form: Gallbladder, AJCC 8th Edition - Clinical stage from 11/13/2018: Stage IVB (cTX, cN2, pM1) - Signed by Truitt Merle, MD on 12/15/2018   11/17/2018 Imaging   11/17/2018 CT CAP IMPRESSION: 1. 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 This is associated with extensive upper abdominal and retroperitoneal lymphadenopathy, as well as metastatic lymphadenopathy in the posterior mediastinum and left supraclavicular region. There is also a metastatic lesion to the left ischium. 2. 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 3. Aortic atherosclerosis, in addition to left main and 3 vessel coronary artery disease. Status post median sternotomy for CABG including LIMA to the LAD. 4. There are calcifications of the aortic valve. Echocardiographic correlation for evaluation of potential valvular  dysfunction may be warranted if clinically indicated.    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    12/15/2018 - 11/30/2019 Chemotherapy   First line chemo cisplatin and gemcitabine every 2 weeks on 12/15/18. Had 1 month chemo break in April, restarted on 04/20/19. Stopped 11/30/19 due to disease progression.    01/26/2019 -  Chemotherapy   Zometa q75month starting 01/26/19-11/02/19. Switched to monthly Xgeva injections on 04/03/20. Switched to every 3 months from 10/10/20 due to bone pain.    02/20/2019 PET scan   Restaging PET scan:  IMPRESSION: 1. Positive response to therapy at all primary and metastatic sites. Persistent primary and metastatic lesions do have intense hypermetabolic activity albeit reduced. 2. Decrease in size and hypermetabolic activity of mass lesion in the gallbladder fundus. 3. Interval decrease in size and metabolic activity of periportal adenopathy. 4. Decrease in size and metabolic activity of periaortic lymph nodes. 5. Interval decrease in size and metabolic activity of destructive lesion in the LEFT inferior pubic ramus with new sclerosis. 6. No new or progressive disease.   05/29/2019 Imaging   CT CAP W Contrast  IMPRESSION: Known gallbladder adenocarcinoma, difficult to compare to recent PET, improved from prior CT.  Upper abdominal/retroperitoneal lymphadenopathy, slightly improved from prior PET.  Osseous metastasis involving the left inferior pubic ramus, grossly unchanged.  No evidence of metastatic disease in the chest.  Additional ancillary findings as above.   08/22/2019 Imaging   CT CAP W Contrast  IMPRESSION: 1. Stable soft tissue mass involving the gallbladder fundus. 2. Stable mild porta hepatis and  retroperitoneal lymphadenopathy. 3. Stable sclerotic bone metastases. 4.  No new or progressive metastatic disease identified. 5. Stable 2.3 cm left thyroid lobe nodule.   11/14/2019 Imaging   CT Left Hip WO  contrast  IMPRESSION: 1. 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 Associated soft tissue components have progressed, but there is no evidence of pathologic fracture. 2. No acute osseous findings. 3. 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   12/11/2019 PET scan   IMPRESSION: 1. Significant progression of malignancy, with increased activity in the gallbladder mass; considerable increase in size and activity of porta hepatis, retroperitoneal, and upper pelvic adenopathy; significant increase in size and metabolic activity of the left ischial metastatic lesion with extensive extraosseous component; and new mildly hypermetabolic left supraclavicular and thoracic periaortic lymph nodes suspicious for malignant involvement. 2. Faintly increased activity in a left thyroid nodule which was not previously hypermetabolic. Given the relatively low-grade activity, this may be amenable to surveillance, but a significant minority of thyroid nodules with hypermetabolic activity can represent thyroid cancer. 3. Other imaging findings of potential clinical significance: Chronic paranasal sinusitis. Chronic right middle lobe atelectasis. Aortic Atherosclerosis (ICD10-I70.0). Coronary atherosclerosis with cardiomegaly.   12/17/2019 - 01/07/2020 Radiation Therapy   Palliative Radiation to left hip with Dr. Lisbeth Renshaw 12/17/19-01/07/20   01/10/2020 - 04/28/2020 Chemotherapy   Xeloda 1500 mg BID 1 week on/1 week off starting on 01/10/20, she tolerated poorly and developed n/v, weakness, and low po intake. She was instructed to dose reduce to 500 mg BID which she continued into C2. Increased to 1023m Am and 5051mPM on 02/12/20 and increased to 100044mID starting 02/26/20 starting  with C4. Last dose taken 04/28/20 for this regimen   04/30/2020 PET scan   IMPRESSION: 1. Increase in size of FDG avid gallbladder tumor. There is also been mild increase in size of FDG avid abdominal nodal metastases. Additionally, there is a new left-sided posterior mediastinal paravertebral FDG avid nodal metastases. 2. Similar appearance of small FDG avid left supraclavicular and subcarinal nodal metastases. Mild decrease in size of large sclerotic metastasis with soft tissue component involving the left acetabulum. 3.  Aortic Atherosclerosis (ICD10-I70.0).   05/06/2020 - 10/29/2020 Chemotherapy   Second-line CAPOX q2weeks with Xeloda 1000m30m the AM and 500mg71mthe PM 1 week on/1 week off on 05/06/20 and Oxaliplatin starting 05/07/20. Due to poor toleration with n/v/d, we dose reduced Oxaliplatin to 40mg/9mtarting with C2 on 05/22/20. D/c due to disease progression. Last Xeloda taken 10/29/20   07/24/2020 PET scan   IMPRESSION: 1. Enlarging gallbladder mass invading the liver with interval slight increased FDG uptake. 2. Slightly improved adenopathy as detailed above. 3. No new sites of metastatic disease are identified in the chest or abdomen. 4. Stable appearing extensive sclerotic lesion involving the left acetabulum. No new sites of osseous disease.   10/20/2020 PET scan   IMPRESSION: 1. Enlarged hepatic mass with mildly worsened adenopathy in the chest, abdomen, and upper pelvis. 2. Similar to minimally increased metastatic lesion of the left ischium. 3. Other imaging findings of potential clinical significance: Aortic Atherosclerosis (ICD10-I70.0). Coronary atherosclerosis. Acute on chronic left sphenoid sinusitis with mild chronic right frontal sinusitis. Uterine fibroids.     11/10/2020 -  Chemotherapy   Third-line Stivarga 3 weeks on/1 week off. Titrate up 40mg f81mirst week, 80mg th97mcond week then 120mg the60mt week      CURRENT THERAPY:  Zometa  q3monthsst80month2/7/20-11/13/20. Switched to monthly Xgeva injectionson 04/03/20.Switched to every 3 months from 10/10/20 due to bone pain. -Third-line  Stivarga 3 weeks on/1 week off starting 11/10/20. Titrate up 81m for first week, 86mthe second week then 12040mhe last week  INTERVAL HISTORY: Ms. BaiMaristurns for follow up as scheduled. [place]. She called on 12/6 reporting post prandial nausea, 2-3 soft stools per day, and weakness. [facility]    REVIEW OF SYSTEMS:   Constitutional: Denies fevers, chills or abnormal weight loss Eyes: Denies blurriness of vision Ears, nose, mouth, throat, and face: Denies mucositis or sore throat Respiratory: Denies cough, dyspnea or wheezes Cardiovascular: Denies palpitation, chest discomfort or lower extremity swelling Gastrointestinal:  Denies nausea, heartburn or change in bowel habits Skin: Denies abnormal skin rashes Lymphatics: Denies new lymphadenopathy or easy bruising Neurological:Denies numbness, tingling or new weaknesses Behavioral/Psych: Mood is stable, no new changes  All other systems were reviewed with the patient and are negative.  MEDICAL HISTORY:  Past Medical History:  Diagnosis Date  . Allergic rhinitis   . Arthritis   . Asthma    xolair s 8/05 ?11/07; mastered hfa 12/20/08  . Benign positional vertigo   . CAD (coronary artery disease)    a. 09/2014 NSTEMI s/p LHC with sig 2V dz. dLAD diffusely diseased and not suitable for PCI. unsuccessful RCA PCI d/t heavy calcifications  . gallbladder ca dx'd 10/2018  . GERD (gastroesophageal reflux disease)   [place]  . Hyperlipidemia    <130 ldl pos fm hx, bp  . Hypertension   . Osteopenia    dexa 08/22/07 AP spine + 1.1, left femur -1.3, right femur -.8; dexa 10/06/09 +1.6, left femur =1.6, right femur -.  . PONV (postoperative nausea and vomiting)   . Ruptured disc, cervical   .  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 BREAST SURGERY  10-26-10   Rt. breast bx--for microcalcifications in rt. retroareolar region--dx was hyalinized fibroadenoma  . BUNIONECTOMY Bilateral   . CARDIAC CATHETERIZATION     2015  . CATARACT EXTRACTION Right 02/02/2018   Dr TanSatira Sark CATARACT EXTRACTION Left 03/30/2018  . CERNewtonRGERY  12/01  . CORONARY ARTERY BYPASS GRAFT N/A 10/04/2018   Procedure: CORONARY ARTERY BYPASS GRAFTING (CABG) times 2 using left  Internal mammary artery to LAD, left greater saphenous vein - open harvest.;  Surgeon: HenMelrose NakayamaD;  [facility] [place];  Laterality: N/A;  . IR IMAGING GUIDED PORT INSERTION  12/08/2018  . LEFT HEART CATH AND CORONARY ANGIOGRAPHY N/A 09/27/2018   Procedure: LEFT HEART CATH AND CORONARY ANGIOGRAPHY;  Surgeon: KelTroy SineD;  Location: [facility] [place] [facility];  Service: Cardiovascular;  Laterality: N/A;  . LEFT HEART CATHETERIZATION WITH CORONARY ANGIOGRAM N/A 10/16/2014   Procedure: LEFT HEART CATHETERIZATION WITH CORONARY ANGIOGRAM;  Surgeon: MicBlane OharaD;  [facility];  Service: Cardiovascular;  Laterality: N/A;  . Left L4-5 transforaminal lumbar interbody fusion with Depuy cage, rods and screws, local and allograft bone graft, Vivigen; bilateral decompression/partial hemilaminectomy lumbar five-sacral one  07/2017  . PERCUTANEOUS CORONARY ROTOBLATOR INTERVENTION (PCI-R) N/A 10/17/2014   Procedure: PERCUTANEOUS CORONARY ROTOBLATOR INTERVENTION (PCI-R);  Surgeon: ThoTroy SineD;  [facility];  Service: Cardiovascular;  Laterality: N/A;  . TEE WITHOUT CARDIOVERSION N/A 10/04/2018   Procedure: TRANSESOPHAGEAL ECHOCARDIOGRAM (TEE);  Surgeon: HenMelrose NakayamaD;  [facility];  Service: Open Heart Surgery;  Laterality: N/A;  . VEIN LIGATION AND  STRIPPING Left   . VIDEO BRONCHOSCOPY Bilateral 02/05/2015   Procedure: VIDEO BRONCHOSCOPY WITHOUT FLUORO;  Surgeon: Tanda Rockers, MD;  Location: [facility];  Service: Endoscopy;  Laterality: Bilateral;  . VULVA /PERINEUM BIOPSY  12-30-10   --epidermoid cyst    I have reviewed the social history and family history with the patient and they are unchanged from previous note.  ALLERGIES:  is allergic to fish-derived products, penicillins, aspirin, brilinta [ticagrelor], codeine phosphate, and diphenhydramine hcl.  MEDICATIONS:  Current Outpatient Medications  Medication Sig Dispense Refill  . albuterol (VENTOLIN HFA) 108 (90 Base) MCG/ACT inhaler INHALE 1 TO 2 PUFFS BY MOUTH EVERY 4 HOURS AS NEEDED FOR WHEEZING 8.5 g 0  . atorvastatin (LIPITOR) 40 MG tablet Take 1 tablet (40 mg total) by mouth daily. 90 tablet 3  . bisoprolol (ZEBETA) 10 MG tablet TAKE 1 TABLET(10 MG) BY MOUTH DAILY 90 tablet 2  . budesonide-formoterol (SYMBICORT) 160-4.5 MCG/ACT inhaler INHALE 2 PUFFS BY MOUTH EVERY 12 HOURS 30.6 g 3  . cetirizine (ZYRTEC) 10 MG tablet Take 10 mg by mouth at bedtime as needed for allergies.    . cholecalciferol 2000 units TABS Take 1 tablet (2,000 Units total) by mouth daily. 30 tablet 3  . clopidogrel (PLAVIX) 75 MG tablet TAKE 1 TABLET(75 MG) BY MOUTH DAILY 90 tablet 3  . dextromethorphan-guaiFENesin (MUCINEX DM) 30-600 MG per 12 hr tablet Take 1-2 tablets by mouth every 12 (twelve) hours as needed for cough (with flutter).     . famotidine (PEPCID) 20 MG tablet Take 20 mg by mouth as needed.     . ferrous sulfate 325 (65 FE) MG tablet Take 650 mg by mouth daily with breakfast.     . fluticasone (FLONASE) 50 MCG/ACT nasal spray Place 1-2 sprays into both nostrils 2 (two) times daily as needed for allergies or rhinitis.    Marland Kitchen lidocaine-prilocaine (EMLA) cream APPLY EXTERNALLY TO THE AFFECTED AREA ONCE DAILY AS DIRECTED 30 g 3  . magic mouthwash SOLN Take 5 mLs by mouth 4 (four) times daily.  Leave Benadryl out of compound since pt allergic to it. Use hydrocortisone & nystatin. Swish & swallow or spit for thrush 240 mL 1  . magnesium 30 MG tablet Take 1 tablet (30 mg total) by mouth 2 (two) times daily. 30 tablet 0  . MAGNESIUM-OXIDE 400 (241.3 Mg) MG tablet TAKE 1 TABLET(400 MG) BY MOUTH THREE TIMES DAILY 90 tablet 2  . meclizine (ANTIVERT) 25 MG tablet TAKE 2 TABLETS BY MOUTH THREE TIMES DAILY AS NEEDED 24 tablet 2  . megestrol (MEGACE ES) 625 MG/5ML suspension Take 5 mLs (625 mg total) by mouth daily. 150 mL 0  . montelukast (SINGULAIR) 10 MG tablet TAKE 1 TABLET(10 MG) BY MOUTH AT BEDTIME 90 tablet 1  . Multiple Vitamin (MULTIVITAMIN) capsule Take 1 capsule by mouth daily.     . ondansetron (ZOFRAN) 8 MG tablet Take 1 tablet (8 mg total) by mouth every 8 (eight) hours as needed for nausea or vomiting. 30 tablet 1  . oxyCODONE (OXY IR/ROXICODONE) 5 MG immediate release tablet Take 1 tablet (5 mg total) by mouth every 8 (eight) hours as needed for severe pain. 45 tablet 0  . regorafenib (STIVARGA) 40 MG tablet Take 1 tablet by mouth daily for first week, then increase to 2 tabs daily for second week, and then increase to 3 tabs daily for  third week if tolerated. Take for 3 weeks on, 1 week off. Take with low fat meal. 42 tablet 0  . sacubitril-valsartan (ENTRESTO) 24-26 MG Take 1 tablet by mouth 2 (two) times daily. 180 tablet 3   No current facility-administered medications for this visit.    PHYSICAL EXAMINATION: ECOG PERFORMANCE STATUS: {CHL ONC ECOG PS:[id number]}  There were no vitals filed for this visit. There were no vitals filed for this visit.  GENERAL:alert, no distress and comfortable SKIN: skin color, texture, turgor are normal, no rashes or significant lesions EYES: normal, Conjunctiva are pink and non-injected, sclera clear OROPHARYNX:no exudate, no erythema and lips, buccal mucosa, and tongue normal  NECK: supple, thyroid normal size, non-tender, without  nodularity LYMPH:  no palpable lymphadenopathy in the cervical, axillary or inguinal LUNGS: clear to auscultation and percussion with normal breathing effort HEART: regular rate & rhythm and no murmurs and no lower extremity edema ABDOMEN:abdomen soft, non-tender and normal bowel sounds Musculoskeletal:no cyanosis of digits and no clubbing  NEURO: alert & oriented x 3 with fluent speech, no focal motor/sensory deficits  LABORATORY DATA:  I have reviewed the data as listed CBC Latest Ref Rng & Units 11/10/2020 10/23/2020 10/10/2020  WBC 4.0 - 10.5 K/uL 6.6 6.1 6.7  Hemoglobin 12.0 - 15.0 g/dL 10.7(L) 10.6(L) 10.9(L)  Hematocrit 36 - 46 % 32.4(L) 31.5(L) 33.2(L)  Platelets 150 - 400 K/uL 350 313 282     CMP Latest Ref Rng & Units 11/10/2020 10/23/2020 10/10/2020  Glucose 70 - 99 mg/dL 122(H) 113(H) 101(H)  BUN 8 - 23 mg/dL _0 Creatinine 0.44 - 1.00 mg/dL 0.92 0.95 1.00  Sodium 135 - 145 mmol/L 135 139 138  Potassium 3.5 - 5.1 mmol/L 4.3 3.8 3.9  Chloride 98 - 111 mmol/L 102 104 106  CO2 22 - 32 mmol/L _1 Calcium 8.9 - 10.3 mg/dL 9.2 9.7 9.9  Total Protein 6.5 - 8.1 g/dL 7.8 7.4 7.9  Total Bilirubin 0.3 - 1.2 mg/dL 1.0 0.6 0.7  Alkaline Phos 38 - 126 U/L 81 80 65  AST 15 - 41 U/L 36 39 35  ALT 0 - 44 U/L _2 RADIOGRAPHIC STUDIES: I have personally reviewed the radiological images as listed and agreed with the findings in the report. No results found.   ASSESSMENT & PLAN:  No problem-specific Assessment & Plan notes found for this encounter.   No orders of the defined types were placed in this encounter.  All questions were answered. The patient knows to call the clinic with any problems, questions or concerns. No barriers to learning was detected. I spent {CHL ONC TIME VISIT - IOXBD:[id number]} counseling the patient face to face. The total time spent in the appointment was {CHL ONC TIME VISIT - TMHDQ:[id number]} and more than 50% was on counseling  and review of test results     Alla Feeling, NP 11/25/20

## 2020-11-26 ENCOUNTER — Inpatient Hospital Stay: Payer: Medicare Other

## 2020-11-26 ENCOUNTER — Inpatient Hospital Stay: Payer: Medicare Other | Attending: Obstetrics

## 2020-11-26 ENCOUNTER — Inpatient Hospital Stay (HOSPITAL_BASED_OUTPATIENT_CLINIC_OR_DEPARTMENT_OTHER): Payer: Medicare Other | Admitting: Hematology

## 2020-11-26 ENCOUNTER — Encounter: Payer: Self-pay | Admitting: Hematology

## 2020-11-26 ENCOUNTER — Other Ambulatory Visit: Payer: Self-pay

## 2020-11-26 VITALS — BP 94/61 | HR 87 | Temp 96.6°F | Resp 16 | Ht 59.0 in | Wt 105.4 lb

## 2020-11-26 DIAGNOSIS — K219 Gastro-esophageal reflux disease without esophagitis: Secondary | ICD-10-CM | POA: Diagnosis not present

## 2020-11-26 DIAGNOSIS — C772 Secondary and unspecified malignant neoplasm of intra-abdominal lymph nodes: Secondary | ICD-10-CM | POA: Insufficient documentation

## 2020-11-26 DIAGNOSIS — C23 Malignant neoplasm of gallbladder: Secondary | ICD-10-CM | POA: Diagnosis not present

## 2020-11-26 DIAGNOSIS — J45909 Unspecified asthma, uncomplicated: Secondary | ICD-10-CM | POA: Insufficient documentation

## 2020-11-26 DIAGNOSIS — E860 Dehydration: Secondary | ICD-10-CM

## 2020-11-26 DIAGNOSIS — C7951 Secondary malignant neoplasm of bone: Secondary | ICD-10-CM | POA: Insufficient documentation

## 2020-11-26 DIAGNOSIS — E041 Nontoxic single thyroid nodule: Secondary | ICD-10-CM | POA: Insufficient documentation

## 2020-11-26 DIAGNOSIS — I2510 Atherosclerotic heart disease of native coronary artery without angina pectoris: Secondary | ICD-10-CM | POA: Diagnosis not present

## 2020-11-26 DIAGNOSIS — M8580 Other specified disorders of bone density and structure, unspecified site: Secondary | ICD-10-CM | POA: Insufficient documentation

## 2020-11-26 DIAGNOSIS — Z7951 Long term (current) use of inhaled steroids: Secondary | ICD-10-CM | POA: Insufficient documentation

## 2020-11-26 DIAGNOSIS — D649 Anemia, unspecified: Secondary | ICD-10-CM | POA: Insufficient documentation

## 2020-11-26 DIAGNOSIS — I252 Old myocardial infarction: Secondary | ICD-10-CM | POA: Diagnosis not present

## 2020-11-26 DIAGNOSIS — Z79899 Other long term (current) drug therapy: Secondary | ICD-10-CM | POA: Insufficient documentation

## 2020-11-26 DIAGNOSIS — I119 Hypertensive heart disease without heart failure: Secondary | ICD-10-CM | POA: Diagnosis not present

## 2020-11-26 DIAGNOSIS — Z7952 Long term (current) use of systemic steroids: Secondary | ICD-10-CM | POA: Diagnosis not present

## 2020-11-26 DIAGNOSIS — E785 Hyperlipidemia, unspecified: Secondary | ICD-10-CM | POA: Insufficient documentation

## 2020-11-26 DIAGNOSIS — Z7902 Long term (current) use of antithrombotics/antiplatelets: Secondary | ICD-10-CM | POA: Insufficient documentation

## 2020-11-26 LAB — CBC WITH DIFFERENTIAL (CANCER CENTER ONLY)
Abs Immature Granulocytes: 0.03 10*3/uL (ref 0.00–0.07)
Basophils Absolute: 0 10*3/uL (ref 0.0–0.1)
Basophils Relative: 1 %
Eosinophils Absolute: 0.3 10*3/uL (ref 0.0–0.5)
Eosinophils Relative: 4 %
HCT: 36.6 % (ref 36.0–46.0)
Hemoglobin: 12.4 g/dL (ref 12.0–15.0)
Immature Granulocytes: 0 %
Lymphocytes Relative: 10 %
Lymphs Abs: 0.9 10*3/uL (ref 0.7–4.0)
MCH: 33 pg (ref 26.0–34.0)
MCHC: 33.9 g/dL (ref 30.0–36.0)
MCV: 97.3 fL (ref 80.0–100.0)
Monocytes Absolute: 0.7 10*3/uL (ref 0.1–1.0)
Monocytes Relative: 8 %
Neutro Abs: 6.3 10*3/uL (ref 1.7–7.7)
Neutrophils Relative %: 77 %
Platelet Count: 489 10*3/uL — ABNORMAL HIGH (ref 150–400)
RBC: 3.76 MIL/uL — ABNORMAL LOW (ref 3.87–5.11)
RDW: 16.7 % — ABNORMAL HIGH (ref 11.5–15.5)
WBC Count: 8.2 10*3/uL (ref 4.0–10.5)
nRBC: 0 % (ref 0.0–0.2)

## 2020-11-26 LAB — CMP (CANCER CENTER ONLY)
ALT: 19 U/L (ref 0–44)
AST: 50 U/L — ABNORMAL HIGH (ref 15–41)
Albumin: 3 g/dL — ABNORMAL LOW (ref 3.5–5.0)
Alkaline Phosphatase: 96 U/L (ref 38–126)
Anion gap: 11 (ref 5–15)
BUN: 16 mg/dL (ref 8–23)
CO2: 21 mmol/L — ABNORMAL LOW (ref 22–32)
Calcium: 9.4 mg/dL (ref 8.9–10.3)
Chloride: 104 mmol/L (ref 98–111)
Creatinine: 1.11 mg/dL — ABNORMAL HIGH (ref 0.44–1.00)
GFR, Estimated: 51 mL/min — ABNORMAL LOW (ref >60)
Glucose, Bld: 110 mg/dL — ABNORMAL HIGH (ref 70–99)
Potassium: 4.5 mmol/L (ref 3.5–5.1)
Sodium: 136 mmol/L (ref 135–145)
Total Bilirubin: 0.8 mg/dL (ref 0.3–1.2)
Total Protein: 8.7 g/dL — ABNORMAL HIGH (ref 6.5–8.1)

## 2020-11-26 LAB — MAGNESIUM: Magnesium: 2.2 mg/dL (ref 1.7–2.4)

## 2020-11-26 MED ORDER — DEXAMETHASONE 4 MG PO TABS: 4.0000 mg | ORAL_TABLET | Freq: Every day | ORAL | 0 refills | Status: AC

## 2020-11-26 MED ORDER — HEPARIN SOD (PORK) LOCK FLUSH 100 UNIT/ML IV SOLN
500.0000 [IU] | Freq: Once | INTRAVENOUS | Status: AC | PRN
  Administered 2020-11-26: 500 [IU]
  Filled 2020-11-26: qty 5

## 2020-11-26 MED ORDER — SODIUM CHLORIDE 0.9% FLUSH
10.0000 mL | Freq: Once | INTRAVENOUS | Status: AC | PRN
  Administered 2020-11-26: 10 mL
  Filled 2020-11-26: qty 10

## 2020-11-26 MED ORDER — SODIUM CHLORIDE 0.9 % IV SOLN
INTRAVENOUS | Status: AC
  Filled 2020-11-26 (×2): qty 250

## 2020-11-26 MED ORDER — SODIUM CHLORIDE 0.9 % IV SOLN
INTRAVENOUS | Status: AC
  Filled 2020-11-26: qty 250

## 2020-11-26 NOTE — Progress Notes (Signed)
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 Lymph node, biopsy, mediastinal - LYMPH NODE WITH METASTATIC ADENOCARCINOMA. - SEE MICROSCOPIC DESCRIPTION 2. Plaque, coronary artery - CALCIFIED ATHEROSCLEROTIC PLAQUE.   10/14/2018 Miscellaneous   Foundation One:  MSI stable tumor mutation burden 3Muts/mb ERBB2 amplification CCNE1 amplification TP53 mutation(+) CDK6 amplification  HGF amplification    11/13/2018 Cancer Staging   Staging form: Gallbladder, AJCC 8th Edition - Clinical stage from 11/13/2018: Stage IVB (cTX, cN2, pM1) - Signed by Truitt Merle, MD on 12/15/2018   11/17/2018 Imaging   11/17/2018 CT CAP IMPRESSION: 1. 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 This is associated with extensive upper abdominal and retroperitoneal lymphadenopathy, as well as metastatic lymphadenopathy in the posterior mediastinum and left supraclavicular region. There is also a metastatic lesion to the left ischium. 2. 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 3. Aortic atherosclerosis, in addition to left main and 3 vessel coronary artery disease. Status post median sternotomy for CABG including LIMA to the LAD. 4. There are calcifications of the aortic valve. Echocardiographic correlation for evaluation  of potential valvular dysfunction may be warranted if clinically indicated.    11/21/2018 Initial Diagnosis   Gallbladder cancer ([place])   11/27/2018 Pathology Results   11/27/2018 CA19-9 immunohistochemical stain Per request, a CA19-9 immunohistochemical stain was performed at an outside institution revealing positive staining in the tumor cells.    12/15/2018 - 11/30/2019 Chemotherapy   First line chemo cisplatin and gemcitabine every 2 weeks on 12/15/18. Had 1 month chemo break in April, restarted on 04/20/19. Stopped 11/30/19 due to disease progression.    01/26/2019 -  Chemotherapy   Zometa q71month starting 01/26/19-11/02/19. Switched to monthly Xgeva injections on 04/03/20. Switched to every 3 months from 10/10/20 due to bone pain.    02/20/2019 PET scan   Restaging PET scan:  IMPRESSION: 1. Positive response to therapy at all primary and metastatic sites. Persistent primary and metastatic lesions do have intense hypermetabolic activity albeit reduced. 2. Decrease in size and hypermetabolic activity of mass lesion in the gallbladder fundus. 3. Interval decrease in size and metabolic activity of periportal adenopathy. 4. Decrease in size and metabolic activity of periaortic lymph nodes. 5. Interval decrease in size and metabolic activity of destructive lesion in the LEFT inferior pubic ramus with new sclerosis. 6. No new or progressive disease.   05/29/2019 Imaging   CT CAP W Contrast  IMPRESSION: Known gallbladder adenocarcinoma, difficult to compare to recent PET, improved from prior CT.  Upper abdominal/retroperitoneal lymphadenopathy, slightly improved from prior PET.  Osseous metastasis involving the left inferior pubic ramus, grossly unchanged.  No evidence of metastatic disease in the chest.  Additional ancillary findings as above.   08/22/2019 Imaging   CT CAP W Contrast  IMPRESSION: 1. Stable soft tissue mass involving the gallbladder fundus. 2. Stable mild  porta hepatis and retroperitoneal lymphadenopathy. 3.  Stable sclerotic bone metastases. 4. No new or progressive metastatic disease identified. 5. Stable 2.3 cm left thyroid lobe nodule.   11/14/2019 Imaging   CT Left Hip WO  contrast  IMPRESSION: 1. 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 Associated soft tissue components have progressed, but there is no evidence of pathologic fracture. 2. No acute osseous findings. 3. 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   12/11/2019 PET scan   IMPRESSION: 1. Significant progression of malignancy, with increased activity in the gallbladder mass; considerable increase in size and activity of porta hepatis, retroperitoneal, and upper pelvic adenopathy; significant increase in size and metabolic activity of the left ischial metastatic lesion with extensive extraosseous component; and new mildly hypermetabolic left supraclavicular and thoracic periaortic lymph nodes suspicious for malignant involvement. 2. Faintly increased activity in a left thyroid nodule which was not previously hypermetabolic. Given the relatively low-grade activity, this may be amenable to surveillance, but a significant minority of thyroid nodules with hypermetabolic activity can represent thyroid cancer. 3. Other imaging findings of potential clinical significance: Chronic paranasal sinusitis. Chronic right middle lobe atelectasis. Aortic Atherosclerosis (ICD10-I70.0). Coronary atherosclerosis with cardiomegaly.   12/17/2019 - 01/07/2020 Radiation Therapy   Palliative Radiation to left hip with Dr. Lisbeth Renshaw 12/17/19-01/07/20   01/10/2020 - 04/28/2020 Chemotherapy   Xeloda 1500 mg BID 1 week on/1 week off starting on 01/10/20, she tolerated poorly and developed n/v, weakness, and low po intake. She was instructed to dose reduce to 500 mg BID which she continued into C2. Increased to 1059m Am and 5043mPM on 02/12/20 and increased to 100061mID  starting 02/26/20 starting with C4. Last dose taken 04/28/20 for this regimen   04/30/2020 PET scan   IMPRESSION: 1. Increase in size of FDG avid gallbladder tumor. There is also been mild increase in size of FDG avid abdominal nodal metastases. Additionally, there is a new left-sided posterior mediastinal paravertebral FDG avid nodal metastases. 2. Similar appearance of small FDG avid left supraclavicular and subcarinal nodal metastases. Mild decrease in size of large sclerotic metastasis with soft tissue component involving the left acetabulum. 3.  Aortic Atherosclerosis (ICD10-I70.0).   05/06/2020 - 10/29/2020 Chemotherapy   Second-line CAPOX q2weeks with Xeloda 1000m44m the AM and 500mg2mthe PM 1 week on/1 week off on 05/06/20 and Oxaliplatin starting 05/07/20. Due to poor toleration with n/v/d, we dose reduced Oxaliplatin to 40mg/33mtarting with C2 on 05/22/20. D/c due to disease progression. Last Xeloda taken 10/29/20   07/24/2020 PET scan   IMPRESSION: 1. Enlarging gallbladder mass invading the liver with interval slight increased FDG uptake. 2. Slightly improved adenopathy as detailed above. 3. No new sites of metastatic disease are identified in the chest or abdomen. 4. Stable appearing extensive sclerotic lesion involving the left acetabulum. No new sites of osseous disease.   10/20/2020 PET scan   IMPRESSION: 1. Enlarged hepatic mass with mildly worsened adenopathy in the chest, abdomen, and upper pelvis. 2. Similar to minimally increased metastatic lesion of the left ischium. 3. Other imaging findings of potential clinical significance: Aortic Atherosclerosis (ICD10-I70.0). Coronary atherosclerosis. Acute on chronic left sphenoid sinusitis with mild chronic right frontal sinusitis. Uterine fibroids.     11/10/2020 - 11/21/2020 Chemotherapy   -Third-line Stivarga 3 weeks on/1 week off starting 11/10/20. Titrate up 40mg f41mirst week, 80mg th71mcond week then 120mg the81mt  week. Stopped after 5 days of 80mg due [place] tolerance (11/21/20)      CURRENT THERAPY:  -Zometa q3monthssta47month  01/26/19-11/02/19. Switched to monthly Xgeva injectionson 04/03/20.Switched to every 3 months from 10/10/20 due to bone pain.  INTERVAL HISTORY:  Cassie Campbell is here for a follow up. She presents to the clinic alone. She notes she tolerated Stivarga at 35m well. When she increased to 83mshe started having symptoms. She notes she feels tired. She notes she will only feel dizzy if she gets up too fast. She notes her appetite and eating is low. She notes she has diarrhea with every time she eats. She is still drinking boost 2 a day. She notes epistaxis once on stivarga and twice off medication. She stopped Stivarga on 11/21/20 at 8039mShe notes since stopping she denies much change in symptoms.     REVIEW OF SYSTEMS:   Constitutional: Denies fevers, chills (+) Low appetite, weight loss Eyes: Denies blurriness of vision Ears, nose, mouth, throat, and face: Denies mucositis or sore throat (+) Epistaxis Respiratory: Denies cough, dyspnea or wheezes Cardiovascular: Denies palpitation, chest discomfort or lower extremity swelling Gastrointestinal:  Denies nausea, heartburn (+) Diarrhea  Skin: Denies abnormal skin rashes Lymphatics: Denies new lymphadenopathy or easy bruising Neurological:Denies numbness, tingling or new weaknesses Behavioral/Psych: Mood is stable, no new changes  All other systems were reviewed with the patient and are negative.  MEDICAL HISTORY:  Past Medical History:  Diagnosis Date  . Allergic rhinitis   . Arthritis   . Asthma    xolair s 8/05 ?11/07; mastered hfa 12/20/08  . Benign positional vertigo   . CAD (coronary artery disease)    a. 09/2014 NSTEMI s/p LHC with sig 2V dz. dLAD diffusely diseased and not suitable for PCI. unsuccessful RCA PCI d/t heavy calcifications  . gallbladder ca dx'd 10/2018  . GERD (gastroesophageal reflux disease)    . Heart [place]  09[place]2014  . Hyperlipidemia    <130 ldl pos fm hx, bp  . Hypertension   . Osteopenia    dexa 08/22/07 AP spine + 1.1, left femur -1.3, right femur -.8; dexa 10/06/09 +1.6, left femur =1.6, right femur -.  . PONV (postoperative nausea and vomiting)   . Ruptured disc, cervical   . 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 BREAST SURGERY  10-26-10   Rt. breast bx--for microcalcifications in rt. retroareolar region--dx was hyalinized fibroadenoma  . BUNIONECTOMY Bilateral   . CARDIAC CATHETERIZATION     2015  . CATARACT EXTRACTION Right 02/02/2018   Dr TanSatira Sark CATARACT EXTRACTION Left 03/30/2018  . [place]  [place]  . CORONARY ARTERY BYPASS GRAFT N/A 10/04/2018   Procedure: CORONARY ARTERY BYPASS GRAFTING (CABG) times 2 using left  Internal mammary artery to LAD, left greater saphenous vein - open harvest.;  Surgeon: HenMelrose NakayamaD;  [facility];  Laterality: N/A;  . IR IMAGING GUIDED PORT INSERTION  12/08/2018  . LEFT HEART CATH AND CORONARY ANGIOGRAPHY N/A 09/27/2018   Procedure: LEFT HEART CATH AND CORONARY ANGIOGRAPHY;  Surgeon: KelTroy SineD;  Location: [facility] [place] [facility];  Service: Cardiovascular;  Laterality: N/A;  . LEFT HEART CATHETERIZATION WITH CORONARY ANGIOGRAM N/A 10/16/2014   Procedure: LEFT HEART CATHETERIZATION WITH CORONARY ANGIOGRAM;  Surgeon: MicBlane OharaD;  [facility];  Service: Cardiovascular;  Laterality: N/A;  . Left L4-5 transforaminal lumbar interbody fusion with Depuy cage, rods and screws, local and allograft  bone graft, Vivigen; bilateral decompression/partial hemilaminectomy lumbar five-sacral one  07/2017  . PERCUTANEOUS CORONARY ROTOBLATOR INTERVENTION (PCI-R) N/A 10/17/2014   Procedure: PERCUTANEOUS CORONARY ROTOBLATOR INTERVENTION (PCI-R);  Surgeon: Troy Sine, MD;  [facility];  Service: Cardiovascular;  Laterality: N/A;  . TEE WITHOUT CARDIOVERSION N/A 10/04/2018   Procedure: TRANSESOPHAGEAL ECHOCARDIOGRAM (TEE);  Surgeon: [name], MD;  Location: [place];  Service: Open Heart Surgery;  Laterality: N/A;  . VEIN LIGATION AND STRIPPING Left   . VIDEO BRONCHOSCOPY Bilateral 02/05/2015   Procedure: VIDEO BRONCHOSCOPY WITHOUT FLUORO;  Surgeon: Tanda Rockers, MD;  Location: [facility]OS[facility];  Service: Endoscopy;  Laterality: Bilateral;  . VULVA /PERINEUM BIOPSY  12-30-10   --epidermoid cyst    I have reviewed the social history and family history with the patient and they are unchanged from previous note.  ALLERGIES:  is allergic to fish-derived products, penicillins, aspirin, brilinta [ticagrelor], codeine phosphate, and diphenhydramine hcl.  MEDICATIONS:  Current Outpatient Medications  Medication Sig Dispense Refill  . albuterol (VENTOLIN HFA) 108 (90 Base) MCG/ACT inhaler INHALE 1 TO 2 PUFFS BY MOUTH EVERY 4 HOURS AS NEEDED FOR WHEEZING 8.5 g 0  . atorvastatin (LIPITOR) 40 MG tablet Take 1 tablet (40 mg total) by mouth daily. 90 tablet 3  . bisoprolol (ZEBETA) 10 MG tablet TAKE 1 TABLET(10 MG) BY MOUTH DAILY 90 tablet 2  . budesonide-formoterol (SYMBICORT) 160-4.5 MCG/ACT inhaler INHALE 2 PUFFS BY MOUTH EVERY 12 HOURS 30.6 g 3  . cetirizine (ZYRTEC) 10 MG tablet Take 10 mg by mouth at bedtime as needed for allergies.    . cholecalciferol 2000 units TABS Take 1 tablet (2,000 Units total) by mouth daily. 30 tablet 3  . clopidogrel (PLAVIX) 75 MG tablet TAKE 1 TABLET(75 MG) BY MOUTH DAILY 90 tablet 3  . dexamethasone (DECADRON) 4 MG tablet Take 1 tablet (4 mg total) by mouth daily. May stop after 4-5 days if appetite and energy improves 10 tablet 0  . dextromethorphan-guaiFENesin (MUCINEX DM) 30-600 MG per 12 hr tablet Take 1-2 tablets by mouth every 12 (twelve) hours as needed for cough (with flutter).     . famotidine  (PEPCID) 20 MG tablet Take 20 mg by mouth as needed.     . ferrous sulfate 325 (65 FE) MG tablet Take 650 mg by mouth daily with breakfast.     . fluticasone (FLONASE) 50 MCG/ACT nasal spray Place 1-2 sprays into both nostrils 2 (two) times daily as needed for allergies or rhinitis.    Marland Kitchen lidocaine-prilocaine (EMLA) cream APPLY EXTERNALLY TO THE AFFECTED AREA ONCE DAILY AS DIRECTED 30 g 3  . magic mouthwash SOLN Take 5 mLs by mouth 4 (four) times daily. Leave Benadryl out of compound since pt allergic to it. Use hydrocortisone & nystatin. Swish & swallow or spit for thrush 240 mL 1  . magnesium 30 MG tablet Take 1 tablet (30 mg total) by mouth 2 (two) times daily. 30 tablet 0  . MAGNESIUM-OXIDE 400 (241.3 Mg) MG tablet TAKE 1 TABLET(400 MG) BY MOUTH THREE TIMES DAILY 90 tablet 2  . meclizine (ANTIVERT) 25 MG tablet TAKE 2 TABLETS BY MOUTH THREE TIMES DAILY AS NEEDED 24 tablet 2  . megestrol (MEGACE ES) 625 MG/5ML suspension Take 5 mLs (625 mg total) by mouth daily. 150 mL 0  . montelukast (SINGULAIR) 10 MG tablet TAKE 1 TABLET(10 MG) BY MOUTH AT BEDTIME 90 tablet 1  . Multiple Vitamin (MULTIVITAMIN) capsule Take 1 capsule by mouth daily.     Marland Kitchen  ondansetron (ZOFRAN) 8 MG tablet Take 1 tablet (8 mg total) by mouth every 8 (eight) hours as needed for nausea or vomiting. 30 tablet 1  . oxyCODONE (OXY IR/ROXICODONE) 5 MG immediate release tablet Take 1 tablet (5 mg total) by mouth every 8 (eight) hours as needed for severe pain. 45 tablet 0  . regorafenib (STIVARGA) 40 MG tablet Take 1 tablet by mouth daily for first week, then increase to 2 tabs daily for second week, and then increase to 3 tabs daily for third week if tolerated. Take for 3 weeks on, 1 week off. Take with low fat meal. 42 tablet 0  . sacubitril-valsartan (ENTRESTO) 24-26 MG Take 1 tablet by mouth 2 (two) times daily. 180 tablet 3   No current facility-administered medications for this visit.    PHYSICAL EXAMINATION: ECOG PERFORMANCE  STATUS: 2 - Symptomatic, <50% confined to bed  Vitals:   11/26/20 1200  BP: 94/61  Pulse: 87  Resp: 16  Temp: (!) 96.6 F (35.9 C)  SpO2: 100%   Filed Weights   11/26/20 1200  Weight: 105 lb 6.4 oz (47.8 kg)    GENERAL:alert, no distress and comfortable SKIN: skin color, texture, turgor are normal, no rashes or significant lesions EYES: normal, Conjunctiva are pink and non-injected, sclera clear  NECK: supple, thyroid normal size, non-tender, without nodularity LYMPH:  no palpable lymphadenopathy in the cervical, axillary  LUNGS: clear to auscultation and percussion with normal breathing effort HEART: regular rate & rhythm and no murmurs and no lower extremity edema ABDOMEN:abdomen soft, non-tender and normal bowel sounds Musculoskeletal:no cyanosis of digits and no clubbing  NEURO: alert & oriented x 3 with fluent speech, no focal motor/sensory deficits  LABORATORY DATA:  I have reviewed the data as listed CBC Latest Ref Rng & Units 11/26/2020 11/10/2020 10/23/2020  WBC 4.0 - 10.5 K/uL 8.2 6.6 6.1  Hemoglobin 12.0 - 15.0 g/dL 12.4 10.7(L) 10.6(L)  Hematocrit 36 - 46 % 36.6 32.4(L) 31.5(L)  Platelets 150 - 400 K/uL 489(H) 350 313     CMP Latest Ref Rng & Units 11/26/2020 11/10/2020 10/23/2020  Glucose 70 - 99 mg/dL 110(H) 122(H) 113(H)  BUN 8 - 23 mg/dL _0 Creatinine 0.44 - 1.00 mg/dL 1.11(H) 0.92 0.95  Sodium 135 - 145 mmol/L 136 135 139  Potassium 3.5 - 5.1 mmol/L 4.5 4.3 3.8  Chloride 98 - 111 mmol/L 104 102 104  CO2 22 - 32 mmol/L 21(L) 26 29  Calcium 8.9 - 10.3 mg/dL 9.4 9.2 9.7  Total Protein 6.5 - 8.1 g/dL 8.7(H) 7.8 7.4  Total Bilirubin 0.3 - 1.2 mg/dL 0.8 1.0 0.6  Alkaline Phos 38 - 126 U/L 96 81 80  AST 15 - 41 U/L 50(H) 36 39  ALT 0 - 44 U/L _1 RADIOGRAPHIC STUDIES: I have personally reviewed the radiological images as listed and agreed with the findings in the report. No results found.   ASSESSMENT & PLAN:  Cassie Campbell is a  79 y.o. 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 -She is also onZometa q61monthstarting 01/26/19. Switched to monthly Xgeva on 04/03/20 due to kidney function.Given increased bone pain, will reduce to every 3 months from 10/10/20, due to bone pain. -She progressed on second-line Xeloda  based on 04/2020 PET after 4-5 months of treatment and more recent progressed on CAPOX after 5 months of treatment.   -I started her on third-line therapy with Stivarga 3 weeks on/1 week off beginning 11/10/20. She will titrate up withfirst week at $Remov'40mg'Kvdmnt$ , second week at $Remov'80mg'QVmBLa$  and third week at $Remov'120mg'EyIhpW$  daily for the first cycle  -She tolerated first week of Stivarga at $RemoveBef'40mg'zMMwcQECUW$  well with fatigue. She completed only 5 days of $Remov'80mg'XZPTmX$  before stopping pills due to side effects of epistaxis, fatigue, increased SOB, weight loss and diarrhea. I reviewed management with her today. I will stop Stivarga as I do not think she can tolerate this medication. She is agreeable.  -Physical exam unremarkable. Labs reviewed, plt 489K BG 110, Cr 1.11, Protein 8.7, albumin 3, AST 50. Mag normal.  -I briefly discussed oral anti-HER2 Tucantinib or low dose irinotecan as the next treatment options. Her 11/19/20 Echo shows EF at 30-35%. I also discussed palliative care and hospice if she decides not to have more chemo. She will think about her options, but is leaning towards irinotecan.  -F/u in 4 weeks to determine next steps. She is agreeable.    2. Symptom Management: Diarrhea, fatigue, weight loss, Secondary to stivarga  -She has been eating less and has mild weight loss. She has been very fatigued with weakness. She feels dizzy with orthostatic hypotension. She has liquid stool with each meal.  -I  recommend she increase imodium 2 tabs in the AM and then 1 tab with each BM.  -I will start her on short course dexamethasone (once daily for 5 days) to help fatigue and appetite. I recommend she use with PPI. -I recommend she increase Boost intake to 3-4 bottles daily and maintain hydration. I will give IV Fluids today.   3.CAD, S/P CABGX2 on 09/27/2018, EF25-30% -Currently on Plavix.  -f/u with Dr. Claiborne Billings  4. Asthma, dyspnea -Currently on Symbicort, Singulair, and albuterol as needed -f/u with pulmonary, stable, no recent flare. Stable.  5. Mild Anemia -Secondary to chemoandheropen heart surgery -I discussed her anemia may contribute to her mild SOB. -Normal today (11/24/20)   6. Goal of care discussion, Social support , DNR -The patient understands the goal of care is palliative. She agreed to DNR/DNI -She lives alone. Her only close relative is her daughter, but she is not reliable.  -She may consider moving to nursing home if she needs more help at home. I discussed if she is not able to tolerate treatment will stop. She will be eligible for hospice home care.  -She has walker at home if needed. I encouraged her to carry her phone with her at all times.   Plan -gentle IV fluids 361ml NS over 2 hr today  -stop Stivarga due to poor tolerance  -I called in Dexamethasone today for her anorexia and fatigue   -Lab, and F/u with Isabella Bowens and IV Fluids in 2 weeks  -Lab and f/u with me in 4 weeks    No problem-specific Assessment & Plan notes found for this encounter.   No orders of the defined types were placed in this encounter.  All questions were answered. The patient knows to call the clinic with any problems, questions or concerns. No barriers to learning was detected. The total time spent in the appointment was 30 minutes.     Truitt Merle, MD 11/26/2020   I, Joslyn Devon, am acting as scribe for Truitt Merle, MD.   I have reviewed the above documentation for  accuracy and completeness, and I agree with the above.

## 2020-11-26 NOTE — Patient Instructions (Signed)

## 2020-11-27 LAB — CANCER ANTIGEN 19-9: CA 19-9: 4111 U/mL — ABNORMAL HIGH (ref 0–35)

## 2020-12-09 NOTE — Progress Notes (Signed)
[facility]   [phone] [phone]   Clinic Follow up Note   Patient Care Team: [name], Michael B, MD as PCP - General (Pulmonary Disease) Kelly, Thomas A, MD as PCP - Cardiology (Cardiology) 12/10/2020  CHIEF COMPLAINT: Follow-up metastatic gallbladder cancer  SUMMARY OF ONCOLOGIC HISTORY: Oncology History Overview Note  Cancer Staging Gallbladder cancer (HCC) Staging form: Gallbladder, AJCC 8th Edition - Clinical stage from 11/13/2018: Stage IVB (cTX, cN2, pM1) - Signed by Feng, Yan, MD on 12/15/2018     Gallbladder cancer (HCC)  10/04/2018 Pathology Results   10/04/2018 Surgical Pathology Diagnosis 1. Lymph node, biopsy, mediastinal - LYMPH NODE WITH METASTATIC ADENOCARCINOMA. - SEE MICROSCOPIC DESCRIPTION 2. Plaque, coronary artery - CALCIFIED ATHEROSCLEROTIC PLAQUE.   10/14/2018 Miscellaneous   Foundation One:  MSI stable tumor mutation burden 3Muts/mb ERBB2 amplification CCNE1 amplification TP53 mutation(+) CDK6 amplification  HGF amplification    11/13/2018 Cancer Staging   Staging form: Gallbladder, AJCC 8th Edition - Clinical stage from 11/13/2018: Stage IVB (cTX, cN2, pM1) - Signed by Feng, Yan, MD on 12/15/2018   11/17/2018 Imaging   11/17/2018 CT CAP IMPRESSION: 1. 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 This is associated with extensive upper abdominal and retroperitoneal lymphadenopathy, as well as metastatic lymphadenopathy in the posterior mediastinum and left supraclavicular region. There is also a metastatic lesion to the left ischium. 2. 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 3. Aortic atherosclerosis, in addition to left main and 3 vessel coronary artery disease. Status post median sternotomy for CABG including LIMA to the LAD. 4. There are calcifications of the aortic valve. Echocardiographic correlation for evaluation of potential  valvular dysfunction may be warranted if clinically indicated.    11/21/2018 Initial Diagnosis   Gallbladder cancer (HCC)   11/27/2018 Pathology Results   11/27/2018 CA19-9 immunohistochemical stain Per request, a CA19-9 immunohistochemical stain was performed at an outside institution revealing positive staining in the tumor cells.    12/15/2018 - 11/30/2019 Chemotherapy   First line chemo cisplatin and gemcitabine every 2 weeks on 12/15/18. Had 1 month chemo break in April, restarted on 04/20/19. Stopped 11/30/19 due to disease progression.    01/26/2019 -  Chemotherapy   Zometa q3months starting 01/26/19-11/02/19. Switched to monthly Xgeva injections on 04/03/20. Switched to every 3 months from 10/10/20 due to bone pain.    02/20/2019 PET scan   Restaging PET scan:  IMPRESSION: 1. Positive response to therapy at all primary and metastatic sites. Persistent primary and metastatic lesions do have intense hypermetabolic activity albeit reduced. 2. Decrease in size and hypermetabolic activity of mass lesion in the gallbladder fundus. 3. Interval decrease in size and metabolic activity of periportal adenopathy. 4. Decrease in size and metabolic activity of periaortic lymph nodes. 5. Interval decrease in size and metabolic activity of destructive lesion in the LEFT inferior pubic ramus with new sclerosis. 6. No new or progressive disease.   05/29/2019 Imaging   CT CAP W Contrast  IMPRESSION: Known gallbladder adenocarcinoma, difficult to compare to recent PET, improved from prior CT.  Upper abdominal/retroperitoneal lymphadenopathy, slightly improved from prior PET.  Osseous metastasis involving the left inferior pubic ramus, grossly unchanged.  No evidence of metastatic disease in the chest.  Additional ancillary findings as above.   08/22/2019 Imaging   CT CAP W Contrast  IMPRESSION: 1. Stable soft tissue mass involving the gallbladder fundus. 2. Stable mild porta hepatis  and retroperitoneal lymphadenopathy. 3. Stable sclerotic bone metastases. 4. No   new or progressive metastatic disease identified. 5. Stable 2.3 cm left thyroid lobe nodule.   11/14/2019 Imaging   CT Left Hip WO  contrast  IMPRESSION: 1. 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 Associated soft tissue components have progressed, but there is no evidence of pathologic fracture. 2. No acute osseous findings. 3. 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   12/11/2019 PET scan   IMPRESSION: 1. Significant progression of malignancy, with increased activity in the gallbladder mass; considerable increase in size and activity of porta hepatis, retroperitoneal, and upper pelvic adenopathy; significant increase in size and metabolic activity of the left ischial metastatic lesion with extensive extraosseous component; and new mildly hypermetabolic left supraclavicular and thoracic periaortic lymph nodes suspicious for malignant involvement. 2. Faintly increased activity in a left thyroid nodule which was not previously hypermetabolic. Given the relatively low-grade activity, this may be amenable to surveillance, but a significant minority of thyroid nodules with hypermetabolic activity can represent thyroid cancer. 3. Other imaging findings of potential clinical significance: Chronic paranasal sinusitis. Chronic right middle lobe atelectasis. Aortic Atherosclerosis (ICD10-I70.0). Coronary atherosclerosis with cardiomegaly.   12/17/2019 - 01/07/2020 Radiation Therapy   Palliative Radiation to left hip with Dr. Lisbeth Renshaw 12/17/19-01/07/20   01/10/2020 - 04/28/2020 Chemotherapy   Xeloda 1500 mg BID 1 week on/1 week off starting on 01/10/20, she tolerated poorly and developed n/v, weakness, and low po intake. She was instructed to dose reduce to 500 mg BID which she continued into C2. Increased to 1010m Am and 5082mPM on 02/12/20 and increased to 100071mID starting 02/26/20  starting with C4. Last dose taken 04/28/20 for this regimen   04/30/2020 PET scan   IMPRESSION: 1. Increase in size of FDG avid gallbladder tumor. There is also been mild increase in size of FDG avid abdominal nodal metastases. Additionally, there is a new left-sided posterior mediastinal paravertebral FDG avid nodal metastases. 2. Similar appearance of small FDG avid left supraclavicular and subcarinal nodal metastases. Mild decrease in size of large sclerotic metastasis with soft tissue component involving the left acetabulum. 3.  Aortic Atherosclerosis (ICD10-I70.0).   05/06/2020 - 10/29/2020 Chemotherapy   Second-line CAPOX q2weeks with Xeloda 1000m66m the AM and 500mg17mthe PM 1 week on/1 week off on 05/06/20 and Oxaliplatin starting 05/07/20. Due to poor toleration with n/v/d, we dose reduced Oxaliplatin to 40mg/79mtarting with C2 on 05/22/20. D/c due to disease progression. Last Xeloda taken 10/29/20   07/24/2020 PET scan   IMPRESSION: 1. Enlarging gallbladder mass invading the liver with interval slight increased FDG uptake. 2. Slightly improved adenopathy as detailed above. 3. No new sites of metastatic disease are identified in the chest or abdomen. 4. Stable appearing extensive sclerotic lesion involving the left acetabulum. No new sites of osseous disease.   10/20/2020 PET scan   IMPRESSION: 1. Enlarged hepatic mass with mildly worsened adenopathy in the chest, abdomen, and upper pelvis. 2. Similar to minimally increased metastatic lesion of the left ischium. 3. Other imaging findings of potential clinical significance: Aortic Atherosclerosis (ICD10-I70.0). Coronary atherosclerosis. Acute on chronic left sphenoid sinusitis with mild chronic right frontal sinusitis. Uterine fibroids.     11/10/2020 - 11/21/2020 Chemotherapy   -Third-line Stivarga 3 weeks on/1 week off starting 11/10/20. Titrate up 40mg f32mirst week, 80mg th45mcond week then 120mg the17mt week. Stopped  after 5 days of 80mg due [place] tolerance (11/21/20)     CURRENT THERAPY:  1. Xgeva every 3 months, last given 10/10/2020 2.  Stivarga, on hold since 11/21/2020, discontinued 12/8 due to poor tolerance 3.  Supportive care  INTERVAL HISTORY: Ms. Abrigo returns for follow-up as scheduled. She remains functional and up at home.  Energy improved with IV fluids.  Appetite improved with low-dose Decadron which she took for 5 days.  She has rare abdominal discomfort more at night with lying down, Pepcid helps.  She really takes oxycodone maybe 2/month.  Denies nausea or vomiting.  Bowels moving regularly, no bleeding.  Denies any fever, chills, cough, chest pain, or new issues.  She has been thinking about her treatment options and wants to try low-dose irinotecan.   MEDICAL HISTORY:  Past Medical History:  Diagnosis Date  . Allergic rhinitis   . Arthritis   . Asthma    xolair s 8/05 ?11/07; mastered hfa 12/20/08  . Benign positional vertigo   . CAD (coronary artery disease)    a. 09/2014 NSTEMI s/p LHC with sig 2V dz. dLAD diffusely diseased and not suitable for PCI. unsuccessful RCA PCI d/t heavy calcifications  . gallbladder ca dx'd 10/2018  . GERD (gastroesophageal reflux disease)   . Heart attack ([place])    09/2014  . Hyperlipidemia    <130 ldl pos fm hx, bp  . Hypertension   . Osteopenia    dexa 08/22/07 AP spine + 1.1, left femur -1.3, right femur -.8; dexa 10/06/09 +1.6, left femur =1.6, right femur -.  . PONV (postoperative nausea and vomiting)   . Ruptured disc, cervical   . 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 BREAST SURGERY  10-26-10   Rt. breast bx--for microcalcifications in rt. retroareolar region--dx was hyalinized fibroadenoma  . BUNIONECTOMY Bilateral   . CARDIAC CATHETERIZATION     2015  . CATARACT EXTRACTION Right 02/02/2018   Dr Satira Sark  . CATARACT EXTRACTION Left  03/30/2018  . [place] SURGERY  12/01  . CORONARY ARTERY BYPASS GRAFT N/A 10/04/2018   Procedure: CORONARY ARTERY BYPASS GRAFTING (CABG) times 2 using left  Internal mammary artery to LAD, left greater saphenous vein - open harvest.;  Surgeon: [name], MD;  Location: [place];  Service: Open Heart Surgery;  Laterality: N/A;  . IR IMAGING GUIDED PORT INSERTION  12/08/2018  . LEFT HEART CATH AND CORONARY ANGIOGRAPHY N/A 09/27/2018   Procedure: LEFT HEART CATH AND CORONARY ANGIOGRAPHY;  Surgeon: Troy Sine, MD;  Location: [place] [facility];  Service: Cardiovascular;  Laterality: N/A;  . LEFT HEART CATHETERIZATION WITH CORONARY ANGIOGRAM N/A 10/16/2014   Procedure: LEFT HEART CATHETERIZATION WITH CORONARY ANGIOGRAM;  Surgeon: Blane Ohara, MD;  Location: [facility] CATH LAB;  Service: Cardiovascular;  Laterality: N/A;  . Left L4-5 transforaminal lumbar interbody fusion with Depuy cage, rods and screws, local and allograft bone graft, Vivigen; bilateral decompression/partial hemilaminectomy lumbar five-sacral one  07/2017  . PERCUTANEOUS CORONARY ROTOBLATOR INTERVENTION (PCI-R) N/A 10/17/2014   Procedure: PERCUTANEOUS CORONARY ROTOBLATOR INTERVENTION (PCI-R);  Surgeon: Troy Sine, MD;  Location: [facility] CATH LAB;  Service: Cardiovascular;  Laterality: N/A;  . TEE WITHOUT CARDIOVERSION N/A 10/04/2018   Procedure: TRANSESOPHAGEAL ECHOCARDIOGRAM (TEE);  Surgeon: [name], MD;  Location: [place];  Service: Open Heart Surgery;  Laterality: N/A;  . VEIN LIGATION AND STRIPPING Left   . VIDEO BRONCHOSCOPY Bilateral 02/05/2015   Procedure: VIDEO BRONCHOSCOPY WITHOUT FLUORO;  Surgeon: Tanda Rockers, MD;  Location: [facility];  Service: Endoscopy;  Laterality: Bilateral;  . VULVA /PERINEUM BIOPSY  12-30-10   --epidermoid cyst    I have reviewed the social history and family history with the patient and they are unchanged from previous note.  ALLERGIES:  is allergic to fish-derived  products, penicillins, aspirin, brilinta [ticagrelor], codeine phosphate, and diphenhydramine hcl.  MEDICATIONS:  Current Outpatient Medications  Medication Sig Dispense Refill  . albuterol (VENTOLIN HFA) 108 (90 Base) MCG/ACT inhaler INHALE 1 TO 2 PUFFS BY MOUTH EVERY 4 HOURS AS NEEDED FOR WHEEZING 8.5 g 0  . atorvastatin (LIPITOR) 40 MG tablet Take 1 tablet (40 mg total) by mouth daily. 90 tablet 3  . bisoprolol (ZEBETA) 10 MG tablet TAKE 1 TABLET(10 MG) BY MOUTH DAILY 90 tablet 2  . budesonide-formoterol (SYMBICORT) 160-4.5 MCG/ACT inhaler INHALE 2 PUFFS BY MOUTH EVERY 12 HOURS 30.6 g 3  . cetirizine (ZYRTEC) 10 MG tablet Take 10 mg by mouth at bedtime as needed for allergies.    . cholecalciferol 2000 units TABS Take 1 tablet (2,000 Units total) by mouth daily. 30 tablet 3  . clopidogrel (PLAVIX) 75 MG tablet TAKE 1 TABLET(75 MG) BY MOUTH DAILY 90 tablet 3  . dexamethasone (DECADRON) 4 MG tablet Take 1 tablet (4 mg total) by mouth daily. May stop after 4-5 days if appetite and energy improves 10 tablet 0  . dextromethorphan-guaiFENesin (MUCINEX DM) 30-600 MG per 12 hr tablet Take 1-2 tablets by mouth every 12 (twelve) hours as needed for cough (with flutter).     . famotidine (PEPCID) 20 MG tablet Take 20 mg by mouth as needed.     . ferrous sulfate 325 (65 FE) MG tablet Take 650 mg by mouth daily with breakfast.     . fluticasone (FLONASE) 50 MCG/ACT nasal spray Place 1-2 sprays into both nostrils 2 (two) times daily as needed for allergies or rhinitis.    Marland Kitchen lidocaine-prilocaine (EMLA) cream APPLY EXTERNALLY TO THE AFFECTED AREA ONCE DAILY AS DIRECTED 30 g 3  . magic mouthwash SOLN Take 5 mLs by mouth 4 (four) times daily. Leave Benadryl out of compound since pt allergic to it. Use hydrocortisone & nystatin. Swish & swallow or spit for thrush 240 mL 1  . magnesium 30 MG tablet Take 1 tablet (30 mg total) by mouth 2 (two) times daily. 30 tablet 0  . MAGNESIUM-OXIDE 400 (241.3 Mg) MG tablet  TAKE 1 TABLET(400 MG) BY MOUTH THREE TIMES DAILY 90 tablet 2  . meclizine (ANTIVERT) 25 MG tablet TAKE 2 TABLETS BY MOUTH THREE TIMES DAILY AS NEEDED 24 tablet 2  . megestrol (MEGACE ES) 625 MG/5ML suspension Take 5 mLs (625 mg total) by mouth daily. 150 mL 0  . montelukast (SINGULAIR) 10 MG tablet TAKE 1 TABLET(10 MG) BY MOUTH AT BEDTIME 90 tablet 1  . Multiple Vitamin (MULTIVITAMIN) capsule Take 1 capsule by mouth daily.     . ondansetron (ZOFRAN) 8 MG tablet Take 1 tablet (8 mg total) by mouth every 8 (eight) hours as needed for nausea or vomiting. 30 tablet 1  . oxyCODONE (OXY IR/ROXICODONE) 5 MG immediate release tablet Take 1 tablet (5 mg total) by mouth every 8 (eight) hours as needed for severe pain. 45 tablet 0  . prochlorperazine (COMPAZINE) 10 MG tablet Take 1 tablet (10 mg total) by mouth every 6 (six) hours as needed (Nausea or vomiting). 30 tablet 1  . sacubitril-valsartan (ENTRESTO) 24-26 MG Take 1 tablet by mouth 2 (two) times daily. 180 tablet 3   No current facility-administered  medications for this visit.    PHYSICAL EXAMINATION: ECOG PERFORMANCE STATUS: 2 - Symptomatic, <50% confined to bed  Vitals:   12/10/20 0929  BP: 98/61  Pulse: 83  Resp: 15  Temp: (!) 96.8 F (36 C)  SpO2: 100%   Filed Weights   12/10/20 0929  Weight: 105 lb 14.4 oz (48 kg)    GENERAL:alert, no distress and comfortable SKIN: no rash  EYES:  sclera clear NECK: Without mass LYMPH:  no palpable cervical or supraclavicular lymphadenopathy LUNGS: clear with normal breathing effort HEART: regular rate & rhythm, no lower extremity edema ABDOMEN:abdomen soft, non-tender and normal bowel sounds NEURO: alert & oriented x 3 with fluent speech, no focal motor/sensory deficits PAC without erythema  LABORATORY DATA:  I have reviewed the data as listed CBC Latest Ref Rng & Units 12/10/2020 11/26/2020 11/10/2020  WBC 4.0 - 10.5 K/uL 6.8 8.2 6.6  Hemoglobin 12.0 - 15.0 g/dL 11.6(L) 12.4 10.7(L)   Hematocrit 36.0 - 46.0 % 35.1(L) 36.6 32.4(L)  Platelets 150 - 400 K/uL 399 489(H) 350     CMP Latest Ref Rng & Units 12/10/2020 11/26/2020 11/10/2020  Glucose 70 - 99 mg/dL 121(H) 110(H) 122(H)  BUN 8 - 23 mg/dL 12 16 11  Creatinine 0.44 - 1.00 mg/dL 0.95 1.11(H) 0.92  Sodium 135 - 145 mmol/L 140 136 135  Potassium 3.5 - 5.1 mmol/L 4.5 4.5 4.3  Chloride 98 - 111 mmol/L 105 104 102  CO2 22 - 32 mmol/L 24 21(L) 26  Calcium 8.9 - 10.3 mg/dL 10.2 9.4 9.2  Total Protein 6.5 - 8.1 g/dL 7.8 8.7(H) 7.8  Total Bilirubin 0.3 - 1.2 mg/dL 0.5 0.8 1.0  Alkaline Phos 38 - 126 U/L 79 96 81  AST 15 - 41 U/L 44(H) 50(H) 36  ALT 0 - 44 U/L 22 19 13      RADIOGRAPHIC STUDIES: I have personally reviewed the radiological images as listed and agreed with the findings in the report. No results found.   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 -She progressed on first-line cisplatin and gemcitabine and on second line Xeloda. -She began Zometa but ultimately switched to Xgeva due to renal dysfunction. -She started third line Stivarga in 10/2020 but was unable to tolerate dose titration and eventually stopped in 11/2020 -She has been on treatment break with supportive care over the last 4 weeks. -Dr. Feng previously discussed next line treatment with anti-HER-2 tucatinib or low-dose irinotecan, unfortunately her EF remains low at 30-35%.  We also discussed palliative care and hospice if she did not want more treatment. -Patient wants to try low-dose irinotecan starting after the holiday  2. Left leg pain  -PET on 12/22 showed significant increase in size and metabolic activity of the lesion and extraosseous component  -She completedpalliative RT per Dr. Moodyfrom 12/17/19 to 01/08/20 -pain and weakness resolved after radiation,  has stable mild residual stiffness  3.G1CIPN -Developed with cycle 11cisplatin/gemcitabine, mild intermittent tingling to fingertips -no functional difficulties,tuning fork exam is normal -Improved onoral B complex vitamin, continue monitoring -Not discussed  4. CAD, S/P CABGX2 on 09/27/2018, EF 40-45% -On Lipitor, Zebeta, Plavix, Entresto  -EF 11/2020 30-35% with grade 1 diastolic dysfunction.  EF remains low but slightly improved from 12/2019 -Follow-up next week with Dr. Kelly  5. Asthma, dyspnea -Currently on Symbicort, Singulair, and albuterol as needed -f/u   with Dr. Melvyn Novas  -baseline exertional dyspnea remains stable  6.Goal of care discussion -The patient understands the goal of care is palliative. -she is full code for now  7. Mild Anemia -Secondary to chemoand her open heart surgery -takes oral iron few times per week due to constipation -Stable   Disposition: Ms. Whan appears stable.  She remains off treatment.  Energy and appetite improved with IV fluids and Decadron.  Weight is stable.  Labs reviewed.  No new pain or issues.  She does not need supportive care today.  We again reviewed treatment options including anti-HER-2 Tucatinib vs low-dose irinotecan vs observation and supportive care.  She wants to try low-dose irinotecan.  We reviewed potential toxicities including but not limited to fatigue, low appetite, hair loss, diarrhea, cytopenias including neutropenia, anemia, and thrombocytopenia.  She agrees to proceed and gave informed consent.  The treatment goal remains palliative.  Plan to start after the holiday.  We reviewed symptom management.  She will return for follow-up with cycle 1 irinotecan in 2 weeks.  Okay to start Decadron 4 mg once daily 1-2 days before starting chemo for appetite support.  All questions were answered. The patient knows to call the clinic with any problems, questions or concerns. No barriers to learning were detected.  Total encounter time was 30 minutes.      Alla Feeling, NP 12/10/20

## 2020-12-10 ENCOUNTER — Other Ambulatory Visit: Payer: Self-pay

## 2020-12-10 ENCOUNTER — Inpatient Hospital Stay: Payer: Medicare Other

## 2020-12-10 ENCOUNTER — Inpatient Hospital Stay (HOSPITAL_BASED_OUTPATIENT_CLINIC_OR_DEPARTMENT_OTHER): Payer: Medicare Other | Admitting: Nurse Practitioner

## 2020-12-10 ENCOUNTER — Encounter: Payer: Self-pay | Admitting: Nurse Practitioner

## 2020-12-10 VITALS — BP 98/61 | HR 83 | Temp 96.8°F | Resp 15 | Ht 59.0 in | Wt 105.9 lb

## 2020-12-10 DIAGNOSIS — Z7189 Other specified counseling: Secondary | ICD-10-CM

## 2020-12-10 DIAGNOSIS — C23 Malignant neoplasm of gallbladder: Secondary | ICD-10-CM | POA: Diagnosis not present

## 2020-12-10 LAB — CBC WITH DIFFERENTIAL (CANCER CENTER ONLY)
Abs Immature Granulocytes: 0.02 K/uL (ref 0.00–0.07)
Basophils Absolute: 0.1 K/uL (ref 0.0–0.1)
Basophils Relative: 1 %
Eosinophils Absolute: 0.5 K/uL (ref 0.0–0.5)
Eosinophils Relative: 8 %
HCT: 35.1 % — ABNORMAL LOW (ref 36.0–46.0)
Hemoglobin: 11.6 g/dL — ABNORMAL LOW (ref 12.0–15.0)
Immature Granulocytes: 0 %
Lymphocytes Relative: 10 %
Lymphs Abs: 0.7 K/uL (ref 0.7–4.0)
MCH: 32.3 pg (ref 26.0–34.0)
MCHC: 33 g/dL (ref 30.0–36.0)
MCV: 97.8 fL (ref 80.0–100.0)
Monocytes Absolute: 0.6 K/uL (ref 0.1–1.0)
Monocytes Relative: 8 %
Neutro Abs: 5 K/uL (ref 1.7–7.7)
Neutrophils Relative %: 73 %
Platelet Count: 399 K/uL (ref 150–400)
RBC: 3.59 MIL/uL — ABNORMAL LOW (ref 3.87–5.11)
RDW: 16.5 % — ABNORMAL HIGH (ref 11.5–15.5)
WBC Count: 6.8 K/uL (ref 4.0–10.5)
nRBC: 0 % (ref 0.0–0.2)

## 2020-12-10 LAB — CMP (CANCER CENTER ONLY)
ALT: 22 U/L (ref 0–44)
AST: 44 U/L — ABNORMAL HIGH (ref 15–41)
Albumin: 2.8 g/dL — ABNORMAL LOW (ref 3.5–5.0)
Alkaline Phosphatase: 79 U/L (ref 38–126)
Anion gap: 11 (ref 5–15)
BUN: 12 mg/dL (ref 8–23)
CO2: 24 mmol/L (ref 22–32)
Calcium: 10.2 mg/dL (ref 8.9–10.3)
Chloride: 105 mmol/L (ref 98–111)
Creatinine: 0.95 mg/dL (ref 0.44–1.00)
GFR, Estimated: >60 mL/min (ref >60)
Glucose, Bld: 121 mg/dL — ABNORMAL HIGH (ref 70–99)
Potassium: 4.5 mmol/L (ref 3.5–5.1)
Sodium: 140 mmol/L (ref 135–145)
Total Bilirubin: 0.5 mg/dL (ref 0.3–1.2)
Total Protein: 7.8 g/dL (ref 6.5–8.1)

## 2020-12-10 LAB — MAGNESIUM: Magnesium: 1.7 mg/dL (ref 1.7–2.4)

## 2020-12-10 MED ORDER — HEPARIN SOD (PORK) LOCK FLUSH 100 UNIT/ML IV SOLN
500.0000 [IU] | Freq: Once | INTRAVENOUS | Status: AC | PRN
  Administered 2020-12-10: 09:00:00 500 [IU]
  Filled 2020-12-10: qty 5

## 2020-12-10 MED ORDER — SODIUM CHLORIDE 0.9% FLUSH
10.0000 mL | Freq: Once | INTRAVENOUS | Status: AC | PRN
  Administered 2020-12-10: 09:00:00 10 mL
  Filled 2020-12-10: qty 10

## 2020-12-10 MED ORDER — PROCHLORPERAZINE MALEATE 10 MG PO TABS: 10.0000 mg | ORAL_TABLET | Freq: Four times a day (QID) | ORAL | 1 refills | Status: AC | PRN

## 2020-12-15 ENCOUNTER — Other Ambulatory Visit: Payer: Self-pay

## 2020-12-15 MED ORDER — CLOPIDOGREL BISULFATE 75 MG PO TABS: ORAL_TABLET | ORAL | 3 refills | Status: AC

## 2020-12-20 ENCOUNTER — Other Ambulatory Visit: Payer: Self-pay | Admitting: Hematology

## 2020-12-20 NOTE — Progress Notes (Signed)
DISCONTINUE OFF PATHWAY REGIMEN - Other   [id number]:Capecitabine + Oxaliplatin (850/130):   A cycle is every 21 days:     Capecitabine      Oxaliplatin   **Always confirm dose/schedule in your pharmacy ordering system**  REASON: Disease Progression PRIOR TREATMENT: Capecitabine + Oxaliplatin (850/130) TREATMENT RESPONSE: Partial Response (PR)  START OFF PATHWAY REGIMEN - Other   [id number]:Irinotecan 180 mg/m2 q14 Days:   A cycle is every 14 days:     Irinotecan   **Always confirm dose/schedule in your pharmacy ordering system**  Patient Characteristics: Intent of Therapy: Non-Curative / Palliative Intent, Discussed with Patient

## 2020-12-22 NOTE — Progress Notes (Signed)
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 Lymph node, biopsy, mediastinal - LYMPH NODE WITH METASTATIC ADENOCARCINOMA. - SEE MICROSCOPIC DESCRIPTION 2. Plaque, coronary artery - CALCIFIED ATHEROSCLEROTIC PLAQUE.   10/14/2018 Miscellaneous   Foundation One:  MSI stable tumor mutation burden 3Muts/mb ERBB2 amplification CCNE1 amplification TP53 mutation(+) CDK6 amplification  HGF amplification    11/13/2018 Cancer Staging   Staging form: Gallbladder, AJCC 8th Edition - Clinical stage from 11/13/2018: Stage IVB (cTX, cN2, pM1) - Signed by Truitt Merle, MD on 12/15/2018   11/17/2018 Imaging   11/17/2018 CT CAP IMPRESSION: 1. 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 This is associated with extensive upper abdominal and retroperitoneal lymphadenopathy, as well as metastatic lymphadenopathy in the posterior mediastinum and left supraclavicular region. There is also a metastatic lesion to the left ischium. 2. 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 3. Aortic atherosclerosis, in addition to left main and 3 vessel coronary artery disease. Status post median sternotomy for CABG including LIMA to the LAD. 4. There are calcifications of the aortic valve. Echocardiographic correlation for evaluation  of potential valvular dysfunction may be warranted if clinically indicated.    11/21/2018 Initial Diagnosis   Gallbladder cancer ([place])   11/27/2018 Pathology Results   11/27/2018 CA19-9 immunohistochemical stain Per request, a CA19-9 immunohistochemical stain was performed at an outside institution revealing positive staining in the tumor cells.    12/15/2018 - 11/30/2019 Chemotherapy   First line chemo cisplatin and gemcitabine every 2 weeks on 12/15/18. Had 1 month chemo break in April, restarted on 04/20/19. Stopped 11/30/19 due to disease progression.    01/26/2019 -  Chemotherapy   Zometa q35month starting 01/26/19-11/02/19. Switched to monthly Xgeva injections on 04/03/20. Switched to every 3 months from 10/10/20 due to bone pain.    02/20/2019 PET scan   Restaging PET scan:  IMPRESSION: 1. Positive response to therapy at all primary and metastatic sites. Persistent primary and metastatic lesions do have intense hypermetabolic activity albeit reduced. 2. Decrease in size and hypermetabolic activity of mass lesion in the gallbladder fundus. 3. Interval decrease in size and metabolic activity of periportal adenopathy. 4. Decrease in size and metabolic activity of periaortic lymph nodes. 5. Interval decrease in size and metabolic activity of destructive lesion in the LEFT inferior pubic ramus with new sclerosis. 6. No new or progressive disease.   05/29/2019 Imaging   CT CAP W Contrast  IMPRESSION: Known gallbladder adenocarcinoma, difficult to compare to recent PET, improved from prior CT.  Upper abdominal/retroperitoneal lymphadenopathy, slightly improved from prior PET.  Osseous metastasis involving the left inferior pubic ramus, grossly unchanged.  No evidence of metastatic disease in the chest.  Additional ancillary findings as above.   08/22/2019 Imaging   CT CAP W Contrast  IMPRESSION: 1. Stable soft tissue mass involving the gallbladder fundus. 2. Stable mild  porta hepatis and retroperitoneal lymphadenopathy. 3.  Stable sclerotic bone metastases. 4. No new or progressive metastatic disease identified. 5. Stable 2.3 cm left thyroid lobe nodule.   11/14/2019 Imaging   CT Left Hip WO  contrast  IMPRESSION: 1. 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 Associated soft tissue components have progressed, but there is no evidence of pathologic fracture. 2. No acute osseous findings. 3. 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   12/11/2019 PET scan   IMPRESSION: 1. Significant progression of malignancy, with increased activity in the gallbladder mass; considerable increase in size and activity of porta hepatis, retroperitoneal, and upper pelvic adenopathy; significant increase in size and metabolic activity of the left ischial metastatic lesion with extensive extraosseous component; and new mildly hypermetabolic left supraclavicular and thoracic periaortic lymph nodes suspicious for malignant involvement. 2. Faintly increased activity in a left thyroid nodule which was not previously hypermetabolic. Given the relatively low-grade activity, this may be amenable to surveillance, but a significant minority of thyroid nodules with hypermetabolic activity can represent thyroid cancer. 3. Other imaging findings of potential clinical significance: Chronic paranasal sinusitis. Chronic right middle lobe atelectasis. Aortic Atherosclerosis (ICD10-I70.0). Coronary atherosclerosis with cardiomegaly.   12/17/2019 - 01/07/2020 Radiation Therapy   Palliative Radiation to left hip with Dr. Lisbeth Renshaw 12/17/19-01/07/20   01/10/2020 - 04/28/2020 Chemotherapy   Xeloda 1500 mg BID 1 week on/1 week off starting on 01/10/20, she tolerated poorly and developed n/v, weakness, and low po intake. She was instructed to dose reduce to 500 mg BID which she continued into C2. Increased to 1036m Am and 5014mPM on 02/12/20 and increased to 100036mID  starting 02/26/20 starting with C4. Last dose taken 04/28/20 for this regimen   04/30/2020 PET scan   IMPRESSION: 1. Increase in size of FDG avid gallbladder tumor. There is also been mild increase in size of FDG avid abdominal nodal metastases. Additionally, there is a new left-sided posterior mediastinal paravertebral FDG avid nodal metastases. 2. Similar appearance of small FDG avid left supraclavicular and subcarinal nodal metastases. Mild decrease in size of large sclerotic metastasis with soft tissue component involving the left acetabulum. 3.  Aortic Atherosclerosis (ICD10-I70.0).   05/06/2020 - 10/29/2020 Chemotherapy   Second-line CAPOX q2weeks with Xeloda 1000m86m the AM and 500mg61mthe PM 1 week on/1 week off on 05/06/20 and Oxaliplatin starting 05/07/20. Due to poor toleration with n/v/d, we dose reduced Oxaliplatin to 40mg/62mtarting with C2 on 05/22/20. D/c due to disease progression. Last Xeloda taken 10/29/20   07/24/2020 PET scan   IMPRESSION: 1. Enlarging gallbladder mass invading the liver with interval slight increased FDG uptake. 2. Slightly improved adenopathy as detailed above. 3. No new sites of metastatic disease are identified in the chest or abdomen. 4. Stable appearing extensive sclerotic lesion involving the left acetabulum. No new sites of osseous disease.   10/20/2020 PET scan   IMPRESSION: 1. Enlarged hepatic mass with mildly worsened adenopathy in the chest, abdomen, and upper pelvis. 2. Similar to minimally increased metastatic lesion of the left ischium. 3. Other imaging findings of potential clinical significance: Aortic Atherosclerosis (ICD10-I70.0). Coronary atherosclerosis. Acute on chronic left sphenoid sinusitis with mild chronic right frontal sinusitis. Uterine fibroids.     11/10/2020 - 11/21/2020 Chemotherapy   -Third-line Stivarga 3 weeks on/1 week off starting 11/10/20. Titrate up 40mg f56mirst week, 80mg th45mcond week then 120mg the53mt  week. Stopped after 5 days of 80mg due [place] tolerance (11/21/20)   12/24/2020 -  Chemotherapy   Fourth line  Irinotecan q2weeks starting 12/24/20      CURRENT THERAPY:  -Zometa q61monthstarting 01/26/19-11/02/19. Switched to monthly Xgeva injectionson 04/03/20.Switched to every 3 months (12 weeks) from 10/10/20 due to bone pain. -Fourth line Irinotecan q2weeks starting 12/24/20  INTERVAL HISTORY:  Cassie SIMAis here for a follow up. She presents to the clinic alone. She notes she has mid upper abdominal pain when she wakes up. She gets help from Pepcid. Her left hip pain is manageable on Oxycodone 558mBID. She is able to more active off StWaldronShe notes Dr KeClaiborne Billingseduce her heart medication by half dose. She notes on Xgeva she get body aches a day after infusion and will visit for 2-3 days.    REVIEW OF SYSTEMS:   Constitutional: Denies fevers, chills or abnormal weight loss Eyes: Denies blurriness of vision Ears, nose, mouth, throat, and face: Denies mucositis or sore throat Respiratory: Denies cough, dyspnea or wheezes Cardiovascular: Denies palpitation, chest discomfort or lower extremity swelling Gastrointestinal:  Denies nausea, heartburn or change in bowel habits (+) Occasional upper abdominal pain  MSK: (+) Left hip pain controlled  Skin: Denies abnormal skin rashes Lymphatics: Denies new lymphadenopathy or easy bruising Neurological:Denies numbness, tingling or new weaknesses Behavioral/Psych: Mood is stable, no new changes  All other systems were reviewed with the patient and are negative.  MEDICAL HISTORY:  Past Medical History:  Diagnosis Date  . Allergic rhinitis   . Arthritis   . Asthma    xolair s 8/05 ?11/07; mastered hfa 12/20/08  . Benign positional vertigo   . CAD (coronary artery disease)    a. 09/2014 NSTEMI s/p LHC with sig 2V dz. dLAD diffusely diseased and not suitable for PCI. unsuccessful RCA PCI d/t heavy calcifications  . gallbladder ca dx'd  10/2018  . GERD (gastroesophageal reflux disease)   . Heart attack ([place]   09/2014  . Hyperlipidemia    <130 ldl pos fm hx, bp  . Hypertension   . Osteopenia    dexa 08/22/07 AP spine + 1.1, left femur -1.3, right femur -.8; dexa 10/06/09 +1.6, left femur =1.6, right femur -.  . PONV (postoperative nausea and vomiting)   . Ruptured disc, cervical   . 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 BREAST SURGERY  10-26-10   Rt. breast bx--for microcalcifications in rt. retroareolar region--dx was hyalinized fibroadenoma  . BUNIONECTOMY Bilateral   . CARDIAC CATHETERIZATION     2015  . CATARACT EXTRACTION Right 02/02/2018   Dr TaSatira Sark. CATARACT EXTRACTION Left 03/30/2018  . [place]  12[place]  . CORONARY ARTERY BYPASS GRAFT N/A 10/04/2018   Procedure: CORONARY ARTERY BYPASS GRAFTING (CABG) times 2 using left  Internal mammary artery to LAD, left greater saphenous vein - open harvest.;  Surgeon: HeMelrose NakayamaMD;  [facility] Service: Open [facility] Surgery;  Laterality: N/A;  . IR IMAGING GUIDED PORT INSERTION  12/08/2018  . LEFT HEART CATH AND CORONARY ANGIOGRAPHY N/A 09/27/2018   Procedure: LEFT HEART CATH AND CORONARY ANGIOGRAPHY;  Surgeon: KeTroy SineMD;  [facility];  Service: Cardiovascular;  Laterality: N/A;  . LEFT HEART CATHETERIZATION WITH CORONARY ANGIOGRAM N/A 10/16/2014   Procedure: LEFT HEART CATHETERIZATION WITH CORONARY ANGIOGRAM;  Surgeon: MiBlane OharaMD;  [facility];  Service: Cardiovascular;  Laterality: N/A;  . Left L4-5 transforaminal lumbar interbody fusion  with Depuy cage, rods and screws, local and allograft bone graft, Vivigen; bilateral decompression/partial hemilaminectomy lumbar five-sacral one  07/2017  . PERCUTANEOUS CORONARY ROTOBLATOR INTERVENTION (PCI-R) N/A 10/17/2014   Procedure: PERCUTANEOUS  CORONARY ROTOBLATOR INTERVENTION (PCI-R);  Surgeon: Troy Sine, MD;  Location: [facility] CATH LAB;  Service: Cardiovascular;  Laterality: N/A;  . TEE WITHOUT CARDIOVERSION N/A 10/04/2018   Procedure: TRANSESOPHAGEAL ECHOCARDIOGRAM (TEE);  Surgeon: [name], MD;  Location: [facility];  Service: Open Heart Surgery;  Laterality: N/A;  . VEIN LIGATION AND STRIPPING Left   . VIDEO BRONCHOSCOPY Bilateral 02/05/2015   Procedure: VIDEO BRONCHOSCOPY WITHOUT FLUORO;  Surgeon: Tanda Rockers, MD;  Location: [facility]OS[facility];  Service: Endoscopy;  Laterality: Bilateral;  . VULVA /PERINEUM BIOPSY  12-30-10   --epidermoid cyst    I have reviewed the social history and family history with the patient and they are unchanged from previous note.  ALLERGIES:  is allergic to fish-derived products, penicillins, aspirin, brilinta [ticagrelor], codeine phosphate, and diphenhydramine hcl.  MEDICATIONS:  Current Outpatient Medications  Medication Sig Dispense Refill  . albuterol (VENTOLIN HFA) 108 (90 Base) MCG/ACT inhaler INHALE 1 TO 2 PUFFS BY MOUTH EVERY 4 HOURS AS NEEDED FOR WHEEZING 8.5 g 0  . atorvastatin (LIPITOR) 40 MG tablet Take 1 tablet (40 mg total) by mouth daily. 90 tablet 3  . bisoprolol (ZEBETA) 5 MG tablet Take 1 tablet (5 mg total) by mouth daily. 90 tablet 3  . budesonide-formoterol (SYMBICORT) 160-4.5 MCG/ACT inhaler INHALE 2 PUFFS BY MOUTH EVERY 12 HOURS 30.6 g 3  . cetirizine (ZYRTEC) 10 MG tablet Take 10 mg by mouth at bedtime as needed for allergies.    . cholecalciferol 2000 units TABS Take 1 tablet (2,000 Units total) by mouth daily. 30 tablet 3  . clopidogrel (PLAVIX) 75 MG tablet TAKE 1 TABLET(75 MG) BY MOUTH DAILY 90 tablet 3  . dexamethasone (DECADRON) 4 MG tablet Take 1 tablet (4 mg total) by mouth daily. May stop after 4-5 days if appetite and energy improves 10 tablet 0  . dextromethorphan-guaiFENesin (MUCINEX DM) 30-600 MG per 12 hr tablet Take 1-2 tablets by mouth every 12 (twelve)  hours as needed for cough (with flutter).    . famotidine (PEPCID) 20 MG tablet Take 20 mg by mouth as needed.     . ferrous sulfate 325 (65 FE) MG tablet Take 650 mg by mouth daily with breakfast.     . fluticasone (FLONASE) 50 MCG/ACT nasal spray Place 1-2 sprays into both nostrils 2 (two) times daily as needed for allergies or rhinitis.    Marland Kitchen lidocaine-prilocaine (EMLA) cream APPLY EXTERNALLY TO THE AFFECTED AREA ONCE DAILY AS DIRECTED 30 g 3  . magic mouthwash SOLN Take 5 mLs by mouth 4 (four) times daily. Leave Benadryl out of compound since pt allergic to it. Use hydrocortisone & nystatin. Swish & swallow or spit for thrush 240 mL 1  . magnesium 30 MG tablet Take 1 tablet (30 mg total) by mouth 2 (two) times daily. 30 tablet 0  . MAGNESIUM-OXIDE 400 (241.3 Mg) MG tablet TAKE 1 TABLET(400 MG) BY MOUTH THREE TIMES DAILY 90 tablet 2  . meclizine (ANTIVERT) 25 MG tablet TAKE 2 TABLETS BY MOUTH THREE TIMES DAILY AS NEEDED 24 tablet 2  . megestrol (MEGACE ES) 625 MG/5ML suspension Take 5 mLs (625 mg total) by mouth daily. 150 mL 0  . Multiple Vitamin (MULTIVITAMIN) capsule Take 1 capsule by mouth daily.    . ondansetron (ZOFRAN) 8 MG  tablet Take 1 tablet (8 mg total) by mouth every 8 (eight) hours as needed for nausea or vomiting. 30 tablet 1  . oxyCODONE (OXY IR/ROXICODONE) 5 MG immediate release tablet Take 1 tablet (5 mg total) by mouth every 8 (eight) hours as needed for severe pain. 45 tablet 0  . prochlorperazine (COMPAZINE) 10 MG tablet Take 1 tablet (10 mg total) by mouth every 6 (six) hours as needed (Nausea or vomiting). 30 tablet 1  . sacubitril-valsartan (ENTRESTO) 24-26 MG Take 1 tablet by mouth 2 (two) times daily. 180 tablet 3   No current facility-administered medications for this visit.    PHYSICAL EXAMINATION: ECOG PERFORMANCE STATUS: 2 - Symptomatic, <50% confined to bed  Vitals:   12/24/20 1001  BP: 109/66  Pulse: 81  Resp: 17  Temp: (!) 96.8 F (36 C)  SpO2: 100%    Filed Weights   12/24/20 1001  Weight: 105 lb 12.8 oz (48 kg)    Due to COVID19 we will limit examination to appearance. Patient had no complaints.  GENERAL:alert, no distress and comfortable SKIN: skin color normal, no rashes or significant lesions EYES: normal, Conjunctiva are pink and non-injected, sclera clear  NEURO: alert & oriented x 3 with fluent speech   LABORATORY DATA:  I have reviewed the data as listed CBC Latest Ref Rng & Units 12/24/2020 12/10/2020 11/26/2020  WBC 4.0 - 10.5 K/uL 7.7 6.8 8.2  Hemoglobin 12.0 - 15.0 g/dL 11.1(L) 11.6(L) 12.4  Hematocrit 36.0 - 46.0 % 33.5(L) 35.1(L) 36.6  Platelets 150 - 400 K/uL 434(H) 399 489(H)     CMP Latest Ref Rng & Units 12/24/2020 12/10/2020 11/26/2020  Glucose 70 - 99 mg/dL 127(H) 121(H) 110(H)  BUN 8 - 23 mg/dL 15 12 16   Creatinine 0.44 - 1.00 mg/dL 0.97 0.95 1.11(H)  Sodium 135 - 145 mmol/L 136 140 136  Potassium 3.5 - 5.1 mmol/L 4.0 4.5 4.5  Chloride 98 - 111 mmol/L 100 105 104  CO2 22 - 32 mmol/L 25 24 21(L)  Calcium 8.9 - 10.3 mg/dL 9.4 10.2 9.4  Total Protein 6.5 - 8.1 g/dL 7.8 7.8 8.7(H)  Total Bilirubin 0.3 - 1.2 mg/dL 0.5 0.5 0.8  Alkaline Phos 38 - 126 U/L 82 79 96  AST 15 - 41 U/L 40 44(H) 50(H)  ALT 0 - 44 U/L 17 22 19       RADIOGRAPHIC STUDIES: I have personally reviewed the radiological images as listed and agreed with the findings in the report. No results found.   ASSESSMENT & PLAN:  Cassie Campbell is a 80 y.o. 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 -She is also onZometa q47monthstarting 01/26/19. Switched to monthly Xgeva on 04/03/20 due to kidney function.Given increased bone pain, will reduce to every 3 months (q12weeks) from 10/10/20, due  to bone pain. -She progressed on second-line Xeloda based on 04/2020 PET after 4-5 months of treatmentand more recent progressed on CAPOX after 5 months of treatment.  -I started her on third-line therapy with Stivarga 3 weeks on/1 week off beginning 11/10/20. She will titrate up withfirst week at 470m second week at 8065mnd third week at 120m83milyfor the first cycle  -She tolerated first week of Stivarga at 40mg59ml with fatigue. She completed only 5 days of 80mg 27mre stopping pills  due to side effects of epistaxis, fatigue, increased SOB, weight loss and diarrhea. I reviewed management with her today. I will stop Stivarga as I do not think she can tolerate this medication. She is agreeable.  -I previously discussed oral anti-HER2 Tucantinib or low dose irinotecan as the next treatment options. She opted to proceed with irinotecan q2weeks. Plan to start today (12/25/19) -She has been able to recover well of Stavarga. Labs reviewed, Hg 11.1, plt 434K. Overall adequate to proceed with start of Irinotecan today at lower dose. I encouraged her to watch for diarrhea. She can contact clinic for any significant or unexpected side effects.  -F/u in 2 weeks with C2 and Xgeva.  -she does have bone pain after Xgeva injection, will change it to every 3 months    2. Symptom Management: Diarrhea, fatigue, weight loss, Secondary to stivarga  -She has been eating less and has mild weight loss. She has been very fatigued with weakness. She feels dizzy with orthostatic hypotension. She has liquid stool with each meal.  -I recommend she increase imodium 2 tabs in the AM and then 1 tab with each BM.  -I will start her on short course dexamethasone (once daily for 5 days) to help fatigue and appetite. I recommend she use with PPI. -I recommend she increase Boost intake to 3-4 bottles daily and maintain hydration. I will give IV Fluids today.   3.CAD, S/P CABGX2 on 09/27/2018, EF25-30% -Currently on Plavix.   -f/u with Dr. Claiborne Billings  4. Asthma, dyspnea -Currently on Symbicort, Singulair, and albuterol as needed -f/u with pulmonary, stable, no recent flare. Stable.  5. Mild Anemia -Secondary to chemoandheropen heart surgery -I discussed her anemia may contribute to her mild SOB.  6. Goal of care discussion, Social support, DNR -The patient understands the goal of care is palliative. She agreed to DNR/DNI -She lives alone. Her only close relative is her daughter, but she is not reliable.  -She may consider moving to nursing home if she needs more help at home. I discussed if she is not able to tolerate treatment will stop. She will be eligible for hospice home care.  -She has walker at home if needed. I encouraged her to carry her phone with her at all times.   Plan -Labs reviewed and adequate to proceed with C1 Irinotecan today at lower dose  -Lab, flush, F/u and Irinotecan in 2, 4, 6, 8 weeks  -Xgeva in 2 weeks, continue q12weeks   No problem-specific Assessment & Plan notes found for this encounter.   No orders of the defined types were placed in this encounter.  All questions were answered. The patient knows to call the clinic with any problems, questions or concerns. No barriers to learning was detected. The total time spent in the appointment was 30 minutes.     Truitt Merle, MD 12/24/2020   I, Joslyn Devon, am acting as scribe for Truitt Merle, MD.   I have reviewed the above documentation for accuracy and completeness, and I agree with the above.

## 2020-12-23 ENCOUNTER — Telehealth: Payer: Self-pay | Admitting: Cardiovascular Disease

## 2020-12-23 ENCOUNTER — Encounter: Payer: Self-pay | Admitting: Cardiovascular Disease

## 2020-12-23 ENCOUNTER — Ambulatory Visit: Payer: Medicare Other | Admitting: Cardiovascular Disease

## 2020-12-23 ENCOUNTER — Ambulatory Visit (INDEPENDENT_AMBULATORY_CARE_PROVIDER_SITE_OTHER): Payer: Medicare Other | Admitting: Cardiovascular Disease

## 2020-12-23 ENCOUNTER — Other Ambulatory Visit: Payer: Self-pay

## 2020-12-23 VITALS — BP 100/58 | HR 89 | Ht 59.0 in | Wt 105.0 lb

## 2020-12-23 DIAGNOSIS — I2510 Atherosclerotic heart disease of native coronary artery without angina pectoris: Secondary | ICD-10-CM

## 2020-12-23 DIAGNOSIS — I255 Ischemic cardiomyopathy: Secondary | ICD-10-CM

## 2020-12-23 DIAGNOSIS — I5020 Unspecified systolic (congestive) heart failure: Secondary | ICD-10-CM | POA: Diagnosis not present

## 2020-12-23 DIAGNOSIS — Z951 Presence of aortocoronary bypass graft: Secondary | ICD-10-CM | POA: Diagnosis not present

## 2020-12-23 DIAGNOSIS — E785 Hyperlipidemia, unspecified: Secondary | ICD-10-CM

## 2020-12-23 DIAGNOSIS — C23 Malignant neoplasm of gallbladder: Secondary | ICD-10-CM

## 2020-12-23 MED ORDER — BISOPROLOL FUMARATE 5 MG PO TABS: 5.0000 mg | ORAL_TABLET | Freq: Every day | ORAL | 3 refills | Status: AC

## 2020-12-23 NOTE — Telephone Encounter (Signed)
Spoke to patient advised she can purchase knee hi compression hose at any [employer] store.Advised to get minimal compression.She will not need a prescription to buy.Advised to wear during the day and take off at night.

## 2020-12-23 NOTE — Patient Instructions (Signed)
Medication Instructions:  DECREASE your bisoprolol to 5 mg by mouth daily *If you need a refill on your cardiac medications before your next appointment, please call your pharmacy*   Lab Work: None ordered If you have labs (blood work) drawn today and your tests are completely normal, you will receive your results only by: Marland Kitchen MyChart Message (if you have MyChart) OR . A paper copy in the mail If you have any lab test that is abnormal or we need to change your treatment, we will call you to review the results.   Testing/Procedures: None ordered   Follow-Up: At [facility], you and your health needs are our priority.  As part of our continuing mission to provide you with exceptional heart care, we have created designated Provider Care Teams.  These Care Teams include your primary Cardiologist (physician) and Advanced Practice Providers (APPs -  Physician Assistants and Nurse Practitioners) who all work together to provide you with the care you need, when you need it.  We recommend signing up for the patient portal called "MyChart".  Sign up information is provided on this After Visit Summary.  MyChart is used to connect with patients for Virtual Visits (Telemedicine).  Patients are able to view lab/test results, encounter notes, upcoming appointments, etc.  Non-urgent messages can be sent to your provider as well.   To learn more about what you can do with MyChart, go to [email or web address].    Your next appointment:   3 month(s)  The format for your next appointment:   In Person  Provider:   Nicki Guadalajara, MD   Other Instructions None

## 2020-12-23 NOTE — Telephone Encounter (Signed)
Pt called in and stated that she just left the office and Dr Tresa Endo told her to get some compression stocking but she did not have any info about it.  She would like to speak to nurse about getting those stockings   Best number  [phone]

## 2020-12-24 ENCOUNTER — Ambulatory Visit: Payer: Medicare Other | Admitting: Nurse Practitioner

## 2020-12-24 ENCOUNTER — Encounter: Payer: Self-pay | Admitting: Hematology

## 2020-12-24 ENCOUNTER — Inpatient Hospital Stay: Payer: Medicare Other

## 2020-12-24 ENCOUNTER — Telehealth: Payer: Self-pay | Admitting: Hematology

## 2020-12-24 ENCOUNTER — Other Ambulatory Visit: Payer: Self-pay

## 2020-12-24 ENCOUNTER — Inpatient Hospital Stay: Payer: Medicare Other | Admitting: Hematology

## 2020-12-24 ENCOUNTER — Inpatient Hospital Stay: Payer: Medicare Other | Attending: Obstetrics

## 2020-12-24 VITALS — BP 109/66 | HR 81 | Temp 96.8°F | Resp 17 | Ht 59.0 in | Wt 105.8 lb

## 2020-12-24 DIAGNOSIS — C23 Malignant neoplasm of gallbladder: Secondary | ICD-10-CM | POA: Diagnosis present

## 2020-12-24 DIAGNOSIS — Z5111 Encounter for antineoplastic chemotherapy: Secondary | ICD-10-CM | POA: Insufficient documentation

## 2020-12-24 DIAGNOSIS — C778 Secondary and unspecified malignant neoplasm of lymph nodes of multiple regions: Secondary | ICD-10-CM | POA: Insufficient documentation

## 2020-12-24 DIAGNOSIS — D6481 Anemia due to antineoplastic chemotherapy: Secondary | ICD-10-CM | POA: Insufficient documentation

## 2020-12-24 DIAGNOSIS — J45909 Unspecified asthma, uncomplicated: Secondary | ICD-10-CM | POA: Diagnosis not present

## 2020-12-24 DIAGNOSIS — R197 Diarrhea, unspecified: Secondary | ICD-10-CM | POA: Diagnosis not present

## 2020-12-24 DIAGNOSIS — I2510 Atherosclerotic heart disease of native coronary artery without angina pectoris: Secondary | ICD-10-CM | POA: Diagnosis not present

## 2020-12-24 DIAGNOSIS — Z7189 Other specified counseling: Secondary | ICD-10-CM

## 2020-12-24 DIAGNOSIS — C7951 Secondary malignant neoplasm of bone: Secondary | ICD-10-CM | POA: Insufficient documentation

## 2020-12-24 LAB — CMP (CANCER CENTER ONLY)
ALT: 17 U/L (ref 0–44)
AST: 40 U/L (ref 15–41)
Albumin: 2.8 g/dL — ABNORMAL LOW (ref 3.5–5.0)
Alkaline Phosphatase: 82 U/L (ref 38–126)
Anion gap: 11 (ref 5–15)
BUN: 15 mg/dL (ref 8–23)
CO2: 25 mmol/L (ref 22–32)
Calcium: 9.4 mg/dL (ref 8.9–10.3)
Chloride: 100 mmol/L (ref 98–111)
Creatinine: 0.97 mg/dL (ref 0.44–1.00)
GFR, Estimated: 60 mL/min — ABNORMAL LOW (ref >60)
Glucose, Bld: 127 mg/dL — ABNORMAL HIGH (ref 70–99)
Potassium: 4 mmol/L (ref 3.5–5.1)
Sodium: 136 mmol/L (ref 135–145)
Total Bilirubin: 0.5 mg/dL (ref 0.3–1.2)
Total Protein: 7.8 g/dL (ref 6.5–8.1)

## 2020-12-24 LAB — CBC WITH DIFFERENTIAL (CANCER CENTER ONLY)
Abs Immature Granulocytes: 0.04 10*3/uL (ref 0.00–0.07)
Basophils Absolute: 0 10*3/uL (ref 0.0–0.1)
Basophils Relative: 0 %
Eosinophils Absolute: 0 10*3/uL (ref 0.0–0.5)
Eosinophils Relative: 1 %
HCT: 33.5 % — ABNORMAL LOW (ref 36.0–46.0)
Hemoglobin: 11.1 g/dL — ABNORMAL LOW (ref 12.0–15.0)
Immature Granulocytes: 1 %
Lymphocytes Relative: 7 %
Lymphs Abs: 0.5 10*3/uL — ABNORMAL LOW (ref 0.7–4.0)
MCH: 32.1 pg (ref 26.0–34.0)
MCHC: 33.1 g/dL (ref 30.0–36.0)
MCV: 96.8 fL (ref 80.0–100.0)
Monocytes Absolute: 0.5 10*3/uL (ref 0.1–1.0)
Monocytes Relative: 7 %
Neutro Abs: 6.5 10*3/uL (ref 1.7–7.7)
Neutrophils Relative %: 84 %
Platelet Count: 434 10*3/uL — ABNORMAL HIGH (ref 150–400)
RBC: 3.46 MIL/uL — ABNORMAL LOW (ref 3.87–5.11)
RDW: 15.8 % — ABNORMAL HIGH (ref 11.5–15.5)
WBC Count: 7.7 10*3/uL (ref 4.0–10.5)
nRBC: 0 % (ref 0.0–0.2)

## 2020-12-24 LAB — MAGNESIUM: Magnesium: 1.8 mg/dL (ref 1.7–2.4)

## 2020-12-24 MED ORDER — SODIUM CHLORIDE 0.9 % IV SOLN
Freq: Once | INTRAVENOUS | Status: AC
  Filled 2020-12-24: qty 250

## 2020-12-24 MED ORDER — SODIUM CHLORIDE 0.9 % IV SOLN
100.0000 mg/m2 | Freq: Once | INTRAVENOUS | Status: AC
  Administered 2020-12-24: 140 mg via INTRAVENOUS
  Filled 2020-12-24: qty 7

## 2020-12-24 MED ORDER — SODIUM CHLORIDE 0.9% FLUSH
10.0000 mL | INTRAVENOUS | Status: DC | PRN
  Administered 2020-12-24: 10 mL
  Filled 2020-12-24: qty 10

## 2020-12-24 MED ORDER — ATROPINE SULFATE 1 MG/ML IJ SOLN
0.5000 mg | Freq: Once | INTRAMUSCULAR | Status: AC | PRN
  Administered 2020-12-24: 0.5 mg via INTRAVENOUS

## 2020-12-24 MED ORDER — PALONOSETRON HCL INJECTION 0.25 MG/5ML
INTRAVENOUS | Status: AC
  Filled 2020-12-24: qty 5

## 2020-12-24 MED ORDER — SODIUM CHLORIDE 0.9 % IV SOLN
10.0000 mg | Freq: Once | INTRAVENOUS | Status: AC
  Administered 2020-12-24: 10 mg via INTRAVENOUS
  Filled 2020-12-24: qty 10

## 2020-12-24 MED ORDER — HEPARIN SOD (PORK) LOCK FLUSH 100 UNIT/ML IV SOLN
500.0000 [IU] | Freq: Once | INTRAVENOUS | Status: AC | PRN
  Administered 2020-12-24: 500 [IU]
  Filled 2020-12-24: qty 5

## 2020-12-24 MED ORDER — SODIUM CHLORIDE 0.9% FLUSH
10.0000 mL | Freq: Once | INTRAVENOUS | Status: AC | PRN
  Administered 2020-12-24: 10 mL
  Filled 2020-12-24: qty 10

## 2020-12-24 MED ORDER — PALONOSETRON HCL INJECTION 0.25 MG/5ML
0.2500 mg | Freq: Once | INTRAVENOUS | Status: AC
  Administered 2020-12-24: 0.25 mg via INTRAVENOUS

## 2020-12-24 MED ORDER — OXYCODONE HCL 5 MG PO TABS: 5.0000 mg | ORAL_TABLET | Freq: Three times a day (TID) | ORAL | 0 refills | Status: AC | PRN

## 2020-12-24 MED ORDER — ATROPINE SULFATE 1 MG/ML IJ SOLN
INTRAMUSCULAR | Status: AC
  Filled 2020-12-24: qty 1

## 2020-12-24 NOTE — Patient Instructions (Addendum)
[facility] Discharge Instructions for Patients Receiving Chemotherapy  Today you received the following chemotherapy agents: Irinotecan.  To help prevent nausea and vomiting after your treatment, we encourage you to take your nausea medication as directed.   If you develop nausea and vomiting that is not controlled by your nausea medication, call the clinic.   BELOW ARE SYMPTOMS THAT SHOULD BE REPORTED IMMEDIATELY:  *FEVER GREATER THAN 100.5 F  *CHILLS WITH OR WITHOUT FEVER  NAUSEA AND VOMITING THAT IS NOT CONTROLLED WITH YOUR NAUSEA MEDICATION  *UNUSUAL SHORTNESS OF BREATH  *UNUSUAL BRUISING OR BLEEDING  TENDERNESS IN MOUTH AND THROAT WITH OR WITHOUT PRESENCE OF ULCERS  *URINARY PROBLEMS  *BOWEL PROBLEMS  UNUSUAL RASH Items with * indicate a potential emergency and should be followed up as soon as possible.  Feel free to call the clinic should you have any questions or concerns. The clinic phone number is [phone].  Please show the CHEMO ALERT CARD at check-in to the Emergency Department and triage nurse.  Irinotecan injection What is this medicine? IRINOTECAN (ir in oh TEE kan ) is a chemotherapy drug. It is used to treat colon and rectal cancer. This medicine may be used for other purposes; ask your health care provider or pharmacist if you have questions. COMMON BRAND NAME(S): Camptosar What should I tell my health care provider before I take this medicine? They need to know if you have any of these conditions:  dehydration  diarrhea  infection (especially a virus infection such as chickenpox, cold sores, or herpes)  liver disease  low blood counts, like low white cell, platelet, or red cell counts  low levels of calcium, magnesium, or potassium in the blood  recent or ongoing radiation therapy  an unusual or allergic reaction to irinotecan, other medicines, foods, dyes, or preservatives  pregnant or trying to get  pregnant  breast-feeding How should I use this medicine? This drug is given as an infusion into a vein. It is administered in a hospital or clinic by a specially trained health care professional. Talk to your pediatrician regarding the use of this medicine in children. Special care may be needed. Overdosage: If you think you have taken too much of this medicine contact a poison control center or emergency room at once. NOTE: This medicine is only for you. Do not share this medicine with others. What if I miss a dose? It is important not to miss your dose. Call your doctor or health care professional if you are unable to keep an appointment. What may interact with this medicine? This medicine may interact with the following medications:  antiviral medicines for HIV or AIDS  certain antibiotics like rifampin or rifabutin  certain medicines for fungal infections like itraconazole, ketoconazole, posaconazole, and voriconazole  certain medicines for seizures like carbamazepine, phenobarbital, phenotoin  clarithromycin  gemfibrozil  nefazodone  St. John's Wort This list may not describe all possible interactions. Give your health care provider a list of all the medicines, herbs, non-prescription drugs, or dietary supplements you use. Also tell them if you smoke, drink alcohol, or use illegal drugs. Some items may interact with your medicine. What should I watch for while using this medicine? Your condition will be monitored carefully while you are receiving this medicine. You will need important blood work done while you are taking this medicine. This drug may make you feel generally unwell. This is not uncommon, as chemotherapy can affect healthy cells as well as cancer cells. Report   any side effects. Continue your course of treatment even though you feel ill unless your doctor tells you to stop. In some cases, you may be given additional medicines to help with side effects. Follow all  directions for their use. You may get drowsy or dizzy. Do not drive, use machinery, or do anything that needs mental alertness until you know how this medicine affects you. Do not stand or sit up quickly, especially if you are an older patient. This reduces the risk of dizzy or fainting spells. Call your health care professional for advice if you get a fever, chills, or sore throat, or other symptoms of a cold or flu. Do not treat yourself. This medicine decreases your body's ability to fight infections. Try to avoid being around people who are sick. Avoid taking products that contain aspirin, acetaminophen, ibuprofen, naproxen, or ketoprofen unless instructed by your doctor. These medicines may hide a fever. This medicine may increase your risk to bruise or bleed. Call your doctor or health care professional if you notice any unusual bleeding. Be careful brushing and flossing your teeth or using a toothpick because you may get an infection or bleed more easily. If you have any dental work done, tell your dentist you are receiving this medicine. Do not become pregnant while taking this medicine or for 6 months after stopping it. Women should inform their health care professional if they wish to become pregnant or think they might be pregnant. Men should not father a child while taking this medicine and for 3 months after stopping it. There is potential for serious side effects to an unborn child. Talk to your health care professional for more information. Do not breast-feed an infant while taking this medicine or for 7 days after stopping it. This medicine has caused ovarian failure in some women. This medicine may make it more difficult to get pregnant. Talk to your health care professional if you are concerned about your fertility. This medicine has caused decreased sperm counts in some men. This may make it more difficult to father a child. Talk to your health care professional if you are concerned about  your fertility. What side effects may I notice from receiving this medicine? Side effects that you should report to your doctor or health care professional as soon as possible:  allergic reactions like skin rash, itching or hives, swelling of the face, lips, or tongue  chest pain  diarrhea  flushing, runny nose, sweating during infusion  low blood counts - this medicine may decrease the number of white blood cells, red blood cells and platelets. You may be at increased risk for infections and bleeding.  nausea, vomiting  pain, swelling, warmth in the leg  signs of decreased platelets or bleeding - bruising, pinpoint red spots on the skin, black, tarry stools, blood in the urine  signs of infection - fever or chills, cough, sore throat, pain or difficulty passing urine  signs of decreased red blood cells - unusually weak or tired, fainting spells, lightheadedness Side effects that usually do not require medical attention (report to your doctor or health care professional if they continue or are bothersome):  constipation  hair loss  headache  loss of appetite  mouth sores  stomach pain This list may not describe all possible side effects. Call your doctor for medical advice about side effects. You may report side effects to FDA at 1-800-FDA-1088. Where should I keep my medicine? This drug is given in a hospital or clinic  and will not be stored at home. NOTE: This sheet is a summary. It may not cover all possible information. If you have questions about this medicine, talk to your doctor, pharmacist, or health care provider.  2020 [employer] (2019-01-26 10:09:17)

## 2020-12-24 NOTE — Progress Notes (Signed)
Patient ID: Cassie Campbell, female   DOB: 06-07-1941, 80 y.o.   MRN: [id number]     HPI: Cassie Campbell is a 80 y.o. female who presents for a 4 month follow-up cardiology evaluation.  Cassie Campbell has a history of complex CAD and underwent catheterization by Dr. Fletcher Anon with class IV symptoms on 10/16/2014.  She was found to have diffusely diseased LAD, which was not suitable for revascularization.  She had ruled in for non-ST segment elevation myocardial infarction, felt to be due to a severely calcified almost subtotal RCA with severe diffuse calcified disease.  Initial attempt at PCI by Dr. Fletcher Anon was unsuccessful due to the severe calcification.  She was brought back to the laboratory the following day, and I performed a very long attempt at high-speed rotational atherectomy.  Unfortunately, due to the severely stenosed calcified lesion above the acute margin the Roto floppy wire was never be advanced distal enough due to severe disease beyond this site to allow the burr to be inserted to reach the stenosis.  Multiple attempts were made to utilize wire transfer, but even the smallest catheter was never able to pass the stenosis to allow for the Roto floppy wire to be reinserted in exchange for a Fielder XT wire, which was able to cross the stenosis and advanced distally.  Consequently, the procedure was aborted.  She was sent home on increased medication regimen with potential plans for possible follow-up evaluation depending upon symptom status.  Since hospital discharge, she has not experienced any significant recurrent anginal chest pain.  During the hospitalization ranolazine was added initially at 500 mg twice a day to isosorbide mononitrate, amlodipine, and beta blocker therapy.  She has been treated with aspirin and Brilinta for dual antiplatelet therapy. When I saw her for subsequent evaluation I further titrated her ranolazine to 1000 mg twice a day and further titrated her Lopressor to 37.5 mg  twice a day.  Her creatinine remained normal following her contrast load and she was mildly anemic.    She has a history of asthma and is on Symbicort 160-4.5 and Singulair.  She  has a history of hyperlipidemia, currently on Lipitor 80 mg.  She does have GERD for which he takes Pepcid as well as omeprazole.  She developed some increased wheezing on Lopressor and was changed to bisoprolol 5 mg in place of metoprolol.  She has seen Dr. Melvyn Novas for her severe chronic asthma and in the past.  She also was found to have marked atopy with significant elevation of IgG E levels.  She feels that her breathing has improved with the changed to bisoprolol from Lopressor.   In March 2016 she had been remaining stable but  had experienced a 10-15 minute episode of chest pain during the evening which responded to nitroglycerin. When I saw her the following day she had been taking isosorbide 90 mg in the morning and I added isosorbide 30 mg at bedtime to her medical regimen of Ranexa 1000 mg twice a day , amlodipine 10 mg daily, and bisoprolol 5 mg. She denies recurrent chest pain symptomatology since the nocturnal dose of Imdur was added.  She developed a rash and  was advised to stop both Brilinta and Ranexa.  She was started on Plavix. Her rash ultimately resolved.    Her husband passed away in on 2015-12-22.  She has been more active.  She denies nocturnal symptoms.  She was evaluated in the emergency room in January 2017 with right  shoulder discomfort.    She underwent low back surgery in August 2018 by Dr. Louanne Skye. She tolerated surgery well.  A preoperative nuclear study showed an EF of 70% with probable apical soft tissue attenuation.  There was no ischemia.  Last saw her in November 2018 and she was doing well without chest pain or shortness of breath.  Although she has a history of mild asthma.   She was diagnosed with GERD.  She underwent successful cataract surgery right eye in February and left eye in April  2019 by Dr. Satira Sark.  Since I saw her in June 2019 she was seen by Kerin Ransom on September 26, 2018 with a history suggestive of recurrent unstable angina.  She was hospitalized and on September 27, 2018 repeat cardiac catheterization by me was performed.  This revealed severe coronary calcification involving the LAD circumflex and RCA.  There was smooth 20 to 25% distal left main narrowing.  The LAD had 40% proximal calcified stenosis.  The LAD was severely calcified with 95% stenosis in the region of the first diagonal and there was diffuse disease in the midsegment of 50% followed by 90% mid stenosis and 80% distal stenosis.  The circumflex was calcified without high-grade obstructive disease.  The RCA was calcified and was totally occluded just prior to the acute margin.  There was extensive left-to-right collateralization to the distal RCA via the left circulation.  There was low normal LV function with an EF of 50 to 55% with a small region of focal mid mild anterolateral and posterior basal inferior hypocontractility.  Due to the severe calcification CABG revascularization surgery was recommended.  She underwent CABG surgery x2 with her LIMA to her LAD and SVG to the PDA.  However, during surgery she was instantly found to have a mediastinal lymph node which was sent for pathology and unfortunately showed metastatic adenocarcinoma.  She is now being followed by Dr. 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  PET and CT imaging of her chest abdomen and pelvis with contrast showed diffuse adenopathy from supraclavicular to the abdomen with a hypermetabolic mass in the gallbladder and large hypermetabolic bony lesion in the posterior left pelvis.    I saw her on December 06, 2018 in follow-up of her CABG revascularization and oncologic assessments.  She underwent successful Port-A-Cath placement on December 20 and Plavix was held for this procedure.  I recommended that  she undergo an echo Doppler study prior to initiating chemotherapy.  Her echo Doppler study was done on December 11, 2018 and showed an EF of 40 to 45% status post revascularization with residual akinesis of the mid apical anteroseptal, inferoseptal and apical wall.  She had grade 1 diastolic dysfunction.  Tissue Doppler was consistent with high ventricular filling pressures.  She had mildly increased PA pressure 38 mm and abnormal global longitudinal strain pattern.  On December 15, 2018 she initiated her first course of chemotherapy.  She had some mild nausea but otherwise felt well.  She is feeling better.  She is scheduled to have a follow-up oncology evaluation later this week.  From a cardiac perspective, she denies any chest pain or shortness of breath.  She is now able to walk from room to room without her previous dyspnea or chest discomfort.  She feels improved.    I saw her in a telemedicine evaluation in April 2020.  Since her December evaluation she had been undergoing successful chemotherapy every 2 weeks  with cis-platinum and gemcitabine.  She has completed 6 cycles and is currently taking a month off with plans for reinstitution on May 1.  Her most recent PET scan was significantly encouraging and showed significant early benefit with reduction in tumor burden.   I  saw her in November 2020 at which time she denied any recurrent chest pain.  She was admitting to fatigue following her chemotherapy treatments.    She has undergone reevaluation by Dr. Burr Medico her oncologist and her most recent PET scan from December 2020 showed significant progression of malignancy with increased activity and her gallbladder mass, increase in size and activity of the porta hepatis, retroperitoneal, and upper pelvic adenopathy.  There was also significant increase in size and metabolic activity of the left ischial metastatic lesion with extensive extraosseous component and new mildly hypermetabolic left subclavicular  and thoracic periaortic lymph nodes.  As result, she is scheduled to initiate new therapy next week with Herceptin.  Dr. Burr Medico has requested I see the patient prior to initiation of new therapy and obtain an echo Doppler study.    Per Dr. Ernestina Penna request, I saw Ms Essman prior to initiating chemotherapy on January 04, 2020.  At that time, her blood pressure was stable on bisoprolol 10 mg in addition to furosemide 20 mg daily.  She continues to be on Plavix 75 mg daily.  She was not having any anginal symptoms.  She appears euvolemic on exam.  Her asthma was well controlled.  She denied any chest pain or shortness of breath at rest.  At times there was some mild shortness of breath with activity.  Her echo Doppler study on January 07, 2020 now showed significant decline in LV function with EF 25 to 30%, restrictive physiology with grade 3 diastolic dysfunction.  She had initiated her chemotherapy with Xeloda 1 week on and 1 week off but following the first dose she became sick with nausea and vomiting and it was felt that she had taken too high a dose.  Her dose was subsequently reduced from 1500 mg twice a day to 500 mg bid which she has tolerated.    She was evaluated by me on January 18, 2020 in a follow-up telemedicine evaluation subsequent to her echo Doppler study revealing  reduced LV function.  During that evaluation she was feeling well and did not have any anginal symptoms.  I discussed that it was possible that her reduced LV function may be contributed by her initial round of chemotherapy.  She had continued to be on bisoprolol 10 mg daily in addition to furosemide 20 mg.  With her decline in LV function I suggested initiation of low-dose ARB therapy with losartan 25 mg with plans hopefully to transition to [facility] depending upon blood pressure response.  She had become ill following her first dose of Xeloda when her symptoms resolved with dose adjustment to a significantly lower dose.  She was   evaluated by me on February 15, 2020.  Over the prior month she had felt well.  She tolerated losartan and denied any dizziness or chest pain.  Laboratory in February 9 showed a potassium of 4.3, creatinine 0.86.  She was mildly anemic with a hemoglobin of 11.4 hematocrit 34.9.  During that evaluation recommended discontinuance of furosemide as well as losartan and provided her samples with Entresto 24/26 mg twice a day.  She has felt well with therapy.  She was scheduled to see Kristen Altstadt in several weeks to reassess [facility]  and attempt further titration of blood pressure allow.  However blood pressure was too low to warrant increasing dose.  I  saw her in May 2021 at which time she felt better since initiating Entresto.  She denied any increasing shortness of breath.  The past year she has lost approximately 20 pounds.  She denied chest pain PND orthopnea.  She is tolerating her chemotherapeutic regimen.  She denies presyncope or syncope.    I last saw her on August 20, 2020.  At that time she continued to feel well with reference to shortness of breath and she remained on low-dose Entresto.  She denied presyncope or syncope or recurrent anginal symptomatology.    She was on bisoprolol 10 mg daily and Entresto 24/26 mg twice a day, Plavix 75 mg and atorvastatin 40 mg daily.  At that time her blood pressure remains somewhat low and as result further titration of Entresto could not be undertaken.  I had recommended that she undergo a follow-up echo Doppler study since Entresto initiation.  She underwent her echo Doppler study on November 19, 2020.  This showed reduced LV function at 30 to 35% but this was slightly improved from her January 2021 assessment.  There was grade 1 diastolic dysfunction and aortic valve sclerosis without stenosis.  Presently, she has been without chest pain.  At times she admits to dizziness particularly with standing.  She will be seeing Dr. Larena Sox and will be instituting  new infusion therapy.  She denies chest pain.  She is unaware of palpitations.  She presents for reevaluation.  Past Medical History:  Diagnosis Date  . Allergic rhinitis   . Arthritis   . Asthma    xolair s 8/05 ?11/07; mastered hfa 12/20/08  . Benign positional vertigo   . CAD (coronary artery disease)    a. 09/2014 NSTEMI s/p LHC with sig 2V dz. dLAD diffusely diseased and not suitable for PCI. unsuccessful RCA PCI d/t heavy calcifications  . gallbladder ca dx'd 10/2018  . GERD (gastroesophageal reflux disease)   . Heart attack ([place])    09/2014  . Hyperlipidemia    <130 ldl pos fm hx, bp  . Hypertension   . Osteopenia    dexa 08/22/07 AP spine + 1.1, left femur -1.3, right femur -.8; dexa 10/06/09 +1.6, left femur =1.6, right femur -.  . PONV (postoperative nausea and vomiting)   . Ruptured disc, cervical   . 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 BREAST SURGERY  10-26-10   Rt. breast bx--for microcalcifications in rt. retroareolar region--dx was hyalinized fibroadenoma  . BUNIONECTOMY Bilateral   . CARDIAC CATHETERIZATION     2015  . CATARACT EXTRACTION Right 02/02/2018   Dr Satira Sark  . CATARACT EXTRACTION Left 03/30/2018  . [place] SURGERY  12/01  . CORONARY ARTERY BYPASS GRAFT N/A 10/04/2018   Procedure: CORONARY ARTERY BYPASS GRAFTING (CABG) times 2 using left  Internal mammary artery to LAD, left greater saphenous vein - open harvest.;  Surgeon: [name], MD;  Location: [place];  Service: Open Heart Surgery;  Laterality: N/A;  . IR IMAGING GUIDED PORT INSERTION  12/08/2018  . LEFT HEART CATH AND CORONARY ANGIOGRAPHY N/A 09/27/2018   Procedure: LEFT HEART CATH AND CORONARY ANGIOGRAPHY;  Surgeon: Troy Sine, MD;  Location: [place] [facility];  Service: Cardiovascular;  Laterality: N/A;  . LEFT HEART CATHETERIZATION WITH CORONARY ANGIOGRAM N/A  10/16/2014   Procedure: LEFT  HEART CATHETERIZATION WITH CORONARY ANGIOGRAM;  Surgeon: Blane Ohara, MD;  Location: [facility] CATH LAB;  Service: Cardiovascular;  Laterality: N/A;  . Left L4-5 transforaminal lumbar interbody fusion with Depuy cage, rods and screws, local and allograft bone graft, Vivigen; bilateral decompression/partial hemilaminectomy lumbar five-sacral one  07/2017  . PERCUTANEOUS CORONARY ROTOBLATOR INTERVENTION (PCI-R) N/A 10/17/2014   Procedure: PERCUTANEOUS CORONARY ROTOBLATOR INTERVENTION (PCI-R);  Surgeon: Troy Sine, MD;  Location: [facility] CATH LAB;  Service: Cardiovascular;  Laterality: N/A;  . TEE WITHOUT CARDIOVERSION N/A 10/04/2018   Procedure: TRANSESOPHAGEAL ECHOCARDIOGRAM (TEE);  Surgeon: [name], MD;  Location: [place];  Service: Open Heart Surgery;  Laterality: N/A;  . VEIN LIGATION AND STRIPPING Left   . VIDEO BRONCHOSCOPY Bilateral 02/05/2015   Procedure: VIDEO BRONCHOSCOPY WITHOUT FLUORO;  Surgeon: Tanda Rockers, MD;  Location: [facility] ENDOSCOPY;  Service: Endoscopy;  Laterality: Bilateral;  . VULVA /PERINEUM BIOPSY  12-30-10   --epidermoid cyst    Allergies  Allergen Reactions  . Fish-Derived Products Shortness Of Breath, Swelling and Rash  . Penicillins Shortness Of Breath, Swelling and Rash    PATIENT HAS HAD A PCN REACTION WITH IMMEDIATE RASH, FACIAL/TONGUE/THROAT SWELLING, SOB, OR LIGHTHEADEDNESS WITH HYPOTENSION:  #  #  #  YES  #  #  #   Has patient had a PCN reaction causing severe rash involving mucus membranes or skin necrosis: No PATIENT HAS HAD A PCN REACTION THAT REQUIRED HOSPITALIZATION:  #  #  #  YES  #  #  #   Has patient had a PCN reaction occurring within the last 10 years: No    . Aspirin Swelling and Rash    SWELLING REACTION UNSPECIFIED   . Brilinta [Ticagrelor] Rash    Started Brilinta 09/2014 - rash - unsure which it is directly related to  . Codeine Phosphate Nausea Only  . Diphenhydramine Hcl Rash    Current Outpatient Medications  Medication Sig  Dispense Refill  . albuterol (VENTOLIN HFA) 108 (90 Base) MCG/ACT inhaler INHALE 1 TO 2 PUFFS BY MOUTH EVERY 4 HOURS AS NEEDED FOR WHEEZING 8.5 g 0  . atorvastatin (LIPITOR) 40 MG tablet Take 1 tablet (40 mg total) by mouth daily. 90 tablet 3  . budesonide-formoterol (SYMBICORT) 160-4.5 MCG/ACT inhaler INHALE 2 PUFFS BY MOUTH EVERY 12 HOURS 30.6 g 3  . cetirizine (ZYRTEC) 10 MG tablet Take 10 mg by mouth at bedtime as needed for allergies.    . cholecalciferol 2000 units TABS Take 1 tablet (2,000 Units total) by mouth daily. 30 tablet 3  . clopidogrel (PLAVIX) 75 MG tablet TAKE 1 TABLET(75 MG) BY MOUTH DAILY 90 tablet 3  . dexamethasone (DECADRON) 4 MG tablet Take 1 tablet (4 mg total) by mouth daily. May stop after 4-5 days if appetite and energy improves 10 tablet 0  . dextromethorphan-guaiFENesin (MUCINEX DM) 30-600 MG per 12 hr tablet Take 1-2 tablets by mouth every 12 (twelve) hours as needed for cough (with flutter).    . famotidine (PEPCID) 20 MG tablet Take 20 mg by mouth as needed.     . ferrous sulfate 325 (65 FE) MG tablet Take 650 mg by mouth daily with breakfast.     . fluticasone (FLONASE) 50 MCG/ACT nasal spray Place 1-2 sprays into both nostrils 2 (two) times daily as needed for allergies or rhinitis.    Marland Kitchen lidocaine-prilocaine (EMLA) cream APPLY EXTERNALLY TO THE AFFECTED AREA ONCE DAILY AS DIRECTED 30 g 3  .  magic mouthwash SOLN Take 5 mLs by mouth 4 (four) times daily. Leave Benadryl out of compound since pt allergic to it. Use hydrocortisone & nystatin. Swish & swallow or spit for thrush 240 mL 1  . magnesium 30 MG tablet Take 1 tablet (30 mg total) by mouth 2 (two) times daily. 30 tablet 0  . MAGNESIUM-OXIDE 400 (241.3 Mg) MG tablet TAKE 1 TABLET(400 MG) BY MOUTH THREE TIMES DAILY 90 tablet 2  . meclizine (ANTIVERT) 25 MG tablet TAKE 2 TABLETS BY MOUTH THREE TIMES DAILY AS NEEDED 24 tablet 2  . megestrol (MEGACE ES) 625 MG/5ML suspension Take 5 mLs (625 mg total) by mouth daily.  150 mL 0  . Multiple Vitamin (MULTIVITAMIN) capsule Take 1 capsule by mouth daily.    . ondansetron (ZOFRAN) 8 MG tablet Take 1 tablet (8 mg total) by mouth every 8 (eight) hours as needed for nausea or vomiting. 30 tablet 1  . prochlorperazine (COMPAZINE) 10 MG tablet Take 1 tablet (10 mg total) by mouth every 6 (six) hours as needed (Nausea or vomiting). 30 tablet 1  . sacubitril-valsartan (ENTRESTO) 24-26 MG Take 1 tablet by mouth 2 (two) times daily. 180 tablet 3  . bisoprolol (ZEBETA) 5 MG tablet Take 1 tablet (5 mg total) by mouth daily. 90 tablet 3  . oxyCODONE (OXY IR/ROXICODONE) 5 MG immediate release tablet Take 1 tablet (5 mg total) by mouth every 8 (eight) hours as needed for severe pain. 45 tablet 0   No current facility-administered medications for this visit.    Social History   Socioeconomic History  . Marital status: Widowed    Spouse name: Not on file  . Number of children: Not on file  . Years of education: Not on file  . Highest education level: Not on file  Occupational History  . Occupation: retired Tour manager  Tobacco Use  . Smoking status: Never Smoker  . Smokeless tobacco: Never Used  Vaping Use  . Vaping Use: Never used  Substance and Sexual Activity  . Alcohol use: No    Alcohol/week: 0.0 standard drinks  . Drug use: No  . Sexual activity: Not Currently    Partners: Male    Birth control/protection: Post-menopausal  Other Topics Concern  . Not on file  Social History Narrative  . Not on file   Social Determinants of Health   Financial Resource Strain: Not on file  Food Insecurity: Not on file  Transportation Needs: Not on file  Physical Activity: Not on file  Stress: Not on file  Social Connections: Not on file  Intimate Partner Violence: Not on file    Family History  Problem Relation Age of Onset  . Diabetes Mother        AODM  . Stroke Mother   . Hypertension Mother   . Heart disease Father   . Diabetes Sister         AODM  . Hypertension Sister   . 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 Other comprehensive 14 point system review is negative   Physical Exam BP (!) 100/58   Pulse 89   Ht _0  (1.499 m)   Wt 105 lb (47.6 kg)   LMP 12/21/1999   BMI 21.21 kg/m    Repeat blood pressure by me was 104/60 supine and her blood pressure decreased to 88/60 standing.  Wt Readings from Last 3 Encounters:  12/24/20 105 lb 12.8 oz (48 kg)  12/23/20 105 lb (47.6 kg)  12/10/20 105 lb 14.4 oz (48 kg)    BP (!) 100/58   Pulse 89   Ht _1  (1.499 m)   Wt 105 lb (47.6 kg)   LMP 12/21/1999   BMI 21.21 kg/m  General: Alert, oriented, no distress.  Skin: normal turgor, no rashes, warm and dry HEENT: Normocephalic, atraumatic. Pupils equal round and reactive to light; sclera anicteric; extraocular muscles intact;  Nose without nasal septal hypertrophy Mouth/Parynx benign; Mallinpatti scale 2 Neck: No JVD, no carotid bruits; normal carotid upstroke Lungs:  clear to ausculatation and percussion; no wheezing or rales Chest wall: without tenderness to palpitation Heart: PMI not displaced, RRR, s1 s2 normal, 1/6 systolic murmur, no diastolic murmur, no rubs, gallops, thrills, or heaves Abdomen: soft, nontender; no hepatosplenomehaly, BS+; abdominal aorta nontender and not dilated by palpation. Back: no CVA tenderness Pulses 2+ Musculoskeletal: full range of motion, normal strength, no joint deformities Extremities: no clubbing cyanosis or edema, Homan's sign negative  Neurologic: grossly nonfocal; Cranial nerves grossly wnl Psychologic: Normal mood and affect   ECG (independently read by me): Normal sinus rhythm at 89 bpm, short PR interval at 110 ms.  Mild nonspecific ST-T changes.  August 20, 2020 ECG (independently read by me): NSR at 72; RBBB, precordial T wave inversion, QTc 440 msec  Apr 22, 2020 ECG (independently read by me): Normal sinus rhythm at 65 bpm.  T wave abnormality anterolaterally.  Normal intervals.  No ectopy.  January 04, 2020 ECG (independently read by me): Normal sinus rhythm at 65 bpm.  QS complex V1 V2 with T wave abnormality V1 through V5.  Normal intervals.  No ectopy  November 2020 ECG (independently read by me): Sinus rhythm at 89 bpm.  Nonspecific T wave abnormality.  Normal intervals.  No ectopy  January 2020 ECG (independently read by me): Normal sinus rhythm at 78 bpm.  Biatrial enlargement.  QS complex V1 through V3.  Anterolateral T wave abnormality.  December 2019 ECG (independently read by me): Sinus rhythm at 76 bpm.  QS complex anteroseptally.  Lateral T wave abnormality.    June 2019 ECG (independently read by me): Normal sinus rhythm 81 bpm.  Right bundle branch block with repolarization changes.  November 16, 2017 ECG (independently read by me): Normal sinus rhythm at 75 bpm with right bundle branch block.  Left axis deviation.  Q waves in 3 and aVF.  April 2018 ECG (independently read by me):  Normal sinus rhythm at 74 bpm.  Right bundle-branch block with repolarization changes.  October 2017 ECG (independently read by me): Normal sinus rhythm at 68 bpm.  The right bundle branch block with repolarization changes.  April 2017 ECG (independently read by me): Normal sinus rhythm at 70  bpm.  Right bundle-branch block with repolarization changes.  November 2016 ECG (independently read by me): Normal sinus rhythm at 70 bpm.  Right bundle-branch block with repolarization changes.  May 2016 ECG (independently read by me):  Normal sinus rhythm at 65 bpm. Right bundle branch block with repolarization changes.  March 2016 ECG (independently read by me): Normal sinus rhythm at 68 bpm.  No ectopy.  No signal been ST segment changes.  December 2015 ECG (independently read by me): Normal sinus rhythm at 77 bpm.  Normal intervals.  No significant ST changes.  November 2015 ECG (independently read by me):sinus rhythm at 88 bpm.  QTc interval 454 ms.  No significant ST-T changes.  LABS:  BMP Latest Ref Rng & Units 12/24/2020 12/10/2020 11/26/2020  Glucose 70 - 99 mg/dL 127(H) 121(H) 110(H)  BUN 8 - 23 mg/dL _0 Creatinine 0.44 - 1.00 mg/dL 0.97 0.95 1.11(H)  BUN/Creat Ratio 12 - 28 - - -  Sodium 135 - 145 mmol/L 136 140 136  Potassium 3.5 - 5.1 mmol/L 4.0 4.5 4.5  Chloride 98 - 111 mmol/L 100 105 104  CO2 22 - 32 mmol/L 25 24 21(L)  Calcium 8.9 - 10.3 mg/dL 9.4 10.2 9.4    Hepatic Function Latest Ref Rng & Units 12/24/2020 12/10/2020 11/26/2020  Total Protein 6.5 - 8.1 g/dL 7.8 7.8 8.7(H)  Albumin 3.5 - 5.0 g/dL 2.8(L) 2.8(L) 3.0(L)  AST 15 - 41 U/L 40 44(H) 50(H)  ALT 0 - 44 U/L _1 Alk Phosphatase 38 - 126 U/L 82 79 96  Total Bilirubin 0.3 - 1.2 mg/dL 0.5 0.5 0.8  Bilirubin, Direct 0.0 - 0.3 mg/dL - - -    CBC Latest Ref Rng & Units 12/24/2020 12/10/2020 11/26/2020  WBC 4.0 - 10.5 K/uL 7.7 6.8 8.2  Hemoglobin 12.0 - 15.0 g/dL 11.1(L) 11.6(L) 12.4  Hematocrit 36.0 - 46.0 %  33.5(L) 35.1(L) 36.6  Platelets 150 - 400 K/uL 434(H) 399 489(H)   Lab Results  Component Value Date   MCV 96.8 12/24/2020   MCV 97.8 12/10/2020   MCV 97.3 11/26/2020   Lab Results  Component Value Date   TSH 1.360 11/06/2019   Lab Results  Component Value Date   HGBA1C 5.9 (H) 10/04/2018    Lipid Panel     Component Value Date/Time   CHOL 201 (H) 11/06/2019 1010   TRIG 147 11/06/2019 1010   TRIG 85 10/05/2006 1022   HDL 52 11/06/2019 1010   CHOLHDL 3.9 11/06/2019 1010   CHOLHDL 3 03/09/2018 0909   VLDL 21.8 03/09/2018 0909   LDLCALC 123 (H) 11/06/2019 1010   RADIOLOGY: No results found.  IMPRESSION:  1. CAD in native artery   2. S/P CABG (coronary artery bypass graft)   3. HFrEF (heart failure with reduced ejection fraction) ([place])   4. Ischemic cardiomyopathy   5. Hyperlipidemia with target LDL less than 70   6. Metatatic gallbladder cancer [facility])     ASSESSMENT AND PLAN: Cassie Campbell is a 80 year-old African-American female who suffered a NSTEMI in October 2015 which was felt to be due to subtotal high-grade calcified RCA with severe diffuse distal disease beyond her subtotal stenosis.  She also has a severely diffusely diseased mid distal LAD which was not amenable for revascularization.  She was treated medically and did well until October 2019 when she developed recurrent angina consistent with unstable symptoms necessitating repeat catheterization.  She had severely calcified CAD and unsuccessful  CABG revascularization surgery.  She did well from a cardiac surgical standpoint but unfortunately was found to have mediastinal adenopathy and has been diagnosed with metastatic adenocarcinoma of gallbladder cancer etiology.  Her echo Doppler study prior to initiating chemotherapy showed an EF of 40 to 45% with akinesis of the mid apical anteroseptal, inferoseptal, and apical myocardium with anterior hypokinesis.  She had grade 1 diastolic dysfunction and abnormal  tissue Doppler consistent with high ventricular filling pressures.  Systolic peak pressure was elevated at 38 mm.  Global strain was abnormal.  She initiated chemotherapy on December 15, 2018  and tolerated her initial treatment fairly well. She is followed by Dr. Burr Medico and subsequent imaging studies had initially shown a reduction of her tumor burden.  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  When I saw her prior to instituting therapy she appeared euvolemic.  Her  echo in January 2021 showed an EF reduced at 25 to 30% with restrictive physiology, grade 3 diastolic dysfunction.  She has been transitioned off ARB therapy and was started on Entresto 24/26 mg twice a day.  Over the last several visits, her blood pressure has remained on the low side precluding further titration of Entresto.  Her most recent echo Doppler study from November 19, 2020 has shown slight improvement in LV function with EF now at 30 to 35%.  On this study, she was not felt to have restrictive physiology and had grade 1 diastolic dysfunction.  On exam today she is somewhat orthostatic with reference to her blood pressure and she has experienced episodes of dizziness while standing.  Standing blood pressure had dropped to 88/60 today.  I have recommended slight reduction of bisoprolol to 5 mg to see if this causes some improvement.  She is not on any diuretic therapy or any other medication besides Entresto to reduce her blood pressure.  I also have recommended she wear support stockings at 20 to 30 mm of pressure.  She will be initiating new infusion therapy and will see Dr. Annamaria Boots later this week.  She is not having anginal symptoms.  She continues to be on atorvastatin 40 mg for hyperlipidemia.  She continues to be on Plavix and is not on aspirin for CAD.  I will see her in 3 months for follow-up  evaluation.   Troy Sine, MD, [facility]  12/25/2020 8:30 AM

## 2020-12-24 NOTE — Telephone Encounter (Signed)
Scheduled appointments per 1/5 los. Spoke to patient who is aware of appointments dates and times. Gave patient calendar print out.  

## 2020-12-25 ENCOUNTER — Encounter: Payer: Self-pay | Admitting: Cardiovascular Disease

## 2020-12-25 ENCOUNTER — Telehealth: Payer: Self-pay

## 2020-12-25 LAB — CANCER ANTIGEN 19-9: CA 19-9: 10385 U/mL — ABNORMAL HIGH (ref 0–35)

## 2020-12-25 NOTE — Telephone Encounter (Signed)
Cassie Campbell called stating she has abdominal pain.  No diarrhea, n/v.  I instructed her to take oxycodone.  I reviewed interventions should she develop diarrhea.  She will call if she does develop diarrhea.

## 2020-12-26 ENCOUNTER — Telehealth: Payer: Self-pay | Admitting: *Deleted

## 2020-12-26 NOTE — Telephone Encounter (Signed)
Called pt to discuss how she did with her irinotecan.  She reports not problems except appetite not great but this is not new.  She is eating & drinking fluids & denies any cramping/diarrhea.  Discussed protocol for diarrhea & how to take imodium if she has.  She expressed understanding & states she knows how to reach [place].

## 2020-12-26 NOTE — Telephone Encounter (Signed)
-----   Message from Wylene Men, RN sent at 12/24/2020  4:38 PM EST ----- Regarding: FENG 1ST TIME IRINOTECAN Patient received 1st time irinotecan.  Required atropine for stomach discomfort.  Otherwise tolerated well. No other s/s or c/o discomfort or distress.

## 2020-12-27 ENCOUNTER — Other Ambulatory Visit: Payer: Self-pay | Admitting: Physician Assistant

## 2020-12-29 ENCOUNTER — Telehealth: Payer: Self-pay | Admitting: Hematology

## 2020-12-29 NOTE — Telephone Encounter (Signed)
Scheduled appts per 1/5 los. Pt to get updated appt calendar at next visit per appt notes.  °

## 2021-01-01 ENCOUNTER — Other Ambulatory Visit: Payer: Self-pay | Admitting: Hematology

## 2021-01-01 ENCOUNTER — Telehealth: Payer: Self-pay

## 2021-01-01 MED ORDER — DIPHENOXYLATE-ATROPINE 2.5-0.025 MG PO TABS: 1.0000 | ORAL_TABLET | Freq: Four times a day (QID) | ORAL | 0 refills | Status: DC | PRN

## 2021-01-01 NOTE — Telephone Encounter (Signed)
Ms Gail called stating she has had 4 soft BM's this am.  She is taking the imodium 1 tab after each stool. She is asking what else can be done.  She is following low fiber, low fat, dairy free diet.

## 2021-01-07 ENCOUNTER — Telehealth: Payer: Self-pay

## 2021-01-07 NOTE — Telephone Encounter (Signed)
Cassie Campbell called cancelling appointments for tomorrow secondary to the possible inclement weather.

## 2021-01-08 ENCOUNTER — Inpatient Hospital Stay: Payer: Medicare Other

## 2021-01-08 ENCOUNTER — Inpatient Hospital Stay: Payer: Medicare Other | Admitting: Hematology

## 2021-01-16 NOTE — Progress Notes (Signed)
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 Lymph node, biopsy, mediastinal - LYMPH NODE WITH METASTATIC ADENOCARCINOMA. - SEE MICROSCOPIC DESCRIPTION 2. Plaque, coronary artery - CALCIFIED ATHEROSCLEROTIC PLAQUE.   10/14/2018 Miscellaneous   Foundation One:  MSI stable tumor mutation burden 3Muts/mb ERBB2 amplification CCNE1 amplification TP53 mutation(+) CDK6 amplification  HGF amplification    11/13/2018 Cancer Staging   Staging form: Gallbladder, AJCC 8th Edition - Clinical stage from 11/13/2018: Stage IVB (cTX, cN2, pM1) - Signed by Truitt Merle, MD on 12/15/2018   11/17/2018 Imaging   11/17/2018 CT CAP IMPRESSION: 1. 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 This is associated with extensive upper abdominal and retroperitoneal lymphadenopathy, as well as metastatic lymphadenopathy in the posterior mediastinum and left supraclavicular region. There is also a metastatic lesion to the left ischium. 2. 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 3. Aortic atherosclerosis, in addition to left main and 3 vessel coronary artery disease. Status post median sternotomy for CABG including LIMA to the LAD. 4. There are calcifications of the aortic valve. Echocardiographic correlation for evaluation  of potential valvular dysfunction may be warranted if clinically indicated.    11/21/2018 Initial Diagnosis   Gallbladder cancer ([place])   11/27/2018 Pathology Results   11/27/2018 CA19-9 immunohistochemical stain Per request, a CA19-9 immunohistochemical stain was performed at an outside institution revealing positive staining in the tumor cells.    12/15/2018 - 11/30/2019 Chemotherapy   First line chemo cisplatin and gemcitabine every 2 weeks on 12/15/18. Had 1 month chemo break in April, restarted on 04/20/19. Stopped 11/30/19 due to disease progression.    01/26/2019 -  Chemotherapy   Zometa q24month starting 01/26/19-11/02/19. Switched to monthly Xgeva injections on 04/03/20. Switched to every 3 months from 10/10/20 due to bone pain.    02/20/2019 PET scan   Restaging PET scan:  IMPRESSION: 1. Positive response to therapy at all primary and metastatic sites. Persistent primary and metastatic lesions do have intense hypermetabolic activity albeit reduced. 2. Decrease in size and hypermetabolic activity of mass lesion in the gallbladder fundus. 3. Interval decrease in size and metabolic activity of periportal adenopathy. 4. Decrease in size and metabolic activity of periaortic lymph nodes. 5. Interval decrease in size and metabolic activity of destructive lesion in the LEFT inferior pubic ramus with new sclerosis. 6. No new or progressive disease.   05/29/2019 Imaging   CT CAP W Contrast  IMPRESSION: Known gallbladder adenocarcinoma, difficult to compare to recent PET, improved from prior CT.  Upper abdominal/retroperitoneal lymphadenopathy, slightly improved from prior PET.  Osseous metastasis involving the left inferior pubic ramus, grossly unchanged.  No evidence of metastatic disease in the chest.  Additional ancillary findings as above.   08/22/2019 Imaging   CT CAP W Contrast  IMPRESSION: 1. Stable soft tissue mass involving the gallbladder fundus. 2. Stable mild  porta hepatis and retroperitoneal lymphadenopathy. 3.  Stable sclerotic bone metastases. 4. No new or progressive metastatic disease identified. 5. Stable 2.3 cm left thyroid lobe nodule.   11/14/2019 Imaging   CT Left Hip WO  contrast  IMPRESSION: 1. 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 Associated soft tissue components have progressed, but there is no evidence of pathologic fracture. 2. No acute osseous findings. 3. 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   12/11/2019 PET scan   IMPRESSION: 1. Significant progression of malignancy, with increased activity in the gallbladder mass; considerable increase in size and activity of porta hepatis, retroperitoneal, and upper pelvic adenopathy; significant increase in size and metabolic activity of the left ischial metastatic lesion with extensive extraosseous component; and new mildly hypermetabolic left supraclavicular and thoracic periaortic lymph nodes suspicious for malignant involvement. 2. Faintly increased activity in a left thyroid nodule which was not previously hypermetabolic. Given the relatively low-grade activity, this may be amenable to surveillance, but a significant minority of thyroid nodules with hypermetabolic activity can represent thyroid cancer. 3. Other imaging findings of potential clinical significance: Chronic paranasal sinusitis. Chronic right middle lobe atelectasis. Aortic Atherosclerosis (ICD10-I70.0). Coronary atherosclerosis with cardiomegaly.   12/17/2019 - 01/07/2020 Radiation Therapy   Palliative Radiation to left hip with Dr. Lisbeth Renshaw 12/17/19-01/07/20   01/10/2020 - 04/28/2020 Chemotherapy   Xeloda 1500 mg BID 1 week on/1 week off starting on 01/10/20, she tolerated poorly and developed n/v, weakness, and low po intake. She was instructed to dose reduce to 500 mg BID which she continued into C2. Increased to 1029m Am and 5047mPM on 02/12/20 and increased to 100092mID  starting 02/26/20 starting with C4. Last dose taken 04/28/20 for this regimen   04/30/2020 PET scan   IMPRESSION: 1. Increase in size of FDG avid gallbladder tumor. There is also been mild increase in size of FDG avid abdominal nodal metastases. Additionally, there is a new left-sided posterior mediastinal paravertebral FDG avid nodal metastases. 2. Similar appearance of small FDG avid left supraclavicular and subcarinal nodal metastases. Mild decrease in size of large sclerotic metastasis with soft tissue component involving the left acetabulum. 3.  Aortic Atherosclerosis (ICD10-I70.0).   05/06/2020 - 10/29/2020 Chemotherapy   Second-line CAPOX q2weeks with Xeloda 1000m62m the AM and 500mg41mthe PM 1 week on/1 week off on 05/06/20 and Oxaliplatin starting 05/07/20. Due to poor toleration with n/v/d, we dose reduced Oxaliplatin to 40mg/86mtarting with C2 on 05/22/20. D/c due to disease progression. Last Xeloda taken 10/29/20   07/24/2020 PET scan   IMPRESSION: 1. Enlarging gallbladder mass invading the liver with interval slight increased FDG uptake. 2. Slightly improved adenopathy as detailed above. 3. No new sites of metastatic disease are identified in the chest or abdomen. 4. Stable appearing extensive sclerotic lesion involving the left acetabulum. No new sites of osseous disease.   10/20/2020 PET scan   IMPRESSION: 1. Enlarged hepatic mass with mildly worsened adenopathy in the chest, abdomen, and upper pelvis. 2. Similar to minimally increased metastatic lesion of the left ischium. 3. Other imaging findings of potential clinical significance: Aortic Atherosclerosis (ICD10-I70.0). Coronary atherosclerosis. Acute on chronic left sphenoid sinusitis with mild chronic right frontal sinusitis. Uterine fibroids.     11/10/2020 - 11/21/2020 Chemotherapy   -Third-line Stivarga 3 weeks on/1 week off starting 11/10/20. Titrate up 40mg f21mirst week, 80mg th36mcond week then 120mg the15mt  week. Stopped after 5 days of 80mg due [place] tolerance (11/21/20)   12/24/2020 -  Chemotherapy   Fourth line  Irinotecan q2weeks starting 12/24/20      CURRENT THERAPY:  -Zometa q44monthstarting 01/26/19-11/02/19. Switched to monthly Xgeva injectionson 04/03/20.Switched to every 3 months (q12weeks) from 10/10/20 due to bone pain. -Fourth line Irinotecan q2weeks starting 12/24/20  INTERVAL HISTORY:  Cassie OWis here for a follow up. She presents to the clinic alone. She notes she cancelled her appointment last week due to snow and does not feel well today. She notes left leg pain and her lower abdomen hurts every night. She notes she was not able to get lomotil but her diarrhea stopped on its own after 1 week. She had BM 4 times a day at that time and took imodium. She notes her abdominal pain improves with pain medication and eating exacerbated her pain. This has effected her appetite. She notes she overall feels worse since starting this regimen due to her pain in abdomen, leg and fatigue. However, she feels she has recovered since last cycle. She notes she is not interested in Palliative care at this time. She [place]     REVIEW OF SYSTEMS:   Constitutional: Denies fevers, chills (+) Low appetite, weight loss Eyes: Denies blurriness of vision Ears, nose, mouth, throat, and face: Denies mucositis or sore throat Respiratory: Denies cough, dyspnea or wheezes Cardiovascular: Denies palpitation, chest discomfort or lower extremity swelling Gastrointestinal:  Denies nausea, heartburn or change in bowel habits (+) lower abdominal pain, mainly post prandial Skin: Denies abnormal skin rashes MSK: (+) Left leg pain Lymphatics: Denies new lymphadenopathy or easy bruising Neurological:Denies numbness, tingling or new weaknesses Behavioral/Psych: Mood is stable, no new changes  All other systems were reviewed with the patient and are  negative.  MEDICAL HISTORY:  Past Medical History:  Diagnosis Date  . Allergic rhinitis   . Arthritis   . Asthma    xolair s 8/05 ?11/07; mastered hfa 12/20/08  . Benign positional vertigo   . CAD (coronary artery disease)    a. 09/2014 NSTEMI s/p LHC with sig 2V dz. dLAD diffusely diseased and not suitable for PCI. unsuccessful RCA PCI d/t heavy calcifications  . gallbladder ca dx'd 10/2018  . GERD (gastroesophageal reflux disease)   . Heart attack ([place]    09/2014  . Hyperlipidemia    <130 ldl pos fm hx, bp  . Hypertension   . Osteopenia    dexa 08/22/07 AP spine + 1.1, left femur -1.3, right femur -.8; dexa 10/06/09 +1.6, left femur =1.6, right femur -.  . PONV (postoperative nausea and vomiting)   . Ruptured disc, cervical   . 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 BREAST SURGERY  10-26-10   Rt. breast bx--for microcalcifications in rt. retroareolar region--dx was hyalinized fibroadenoma  . BUNIONECTOMY Bilateral   . CARDIAC CATHETERIZATION     2015  . CATARACT EXTRACTION Right 02/02/2018   Dr TSatira Sark . CATARACT EXTRACTION Left 03/30/2018  . [facility]  12/01  . CORONARY ARTERY BYPASS GRAFT N/A 10/04/2018   Procedure: CORONARY ARTERY BYPASS GRAFTING (CABG) times 2 using left  Internal mammary artery to LAD, left greater saphenous vein - open harvest.;  Surgeon: HMelrose Nakayama MD;  [facility]  Service: Open Heart Surgery;  Laterality: N/A;  . IR IMAGING GUIDED PORT INSERTION  12/08/2018  . LEFT HEART CATH AND CORONARY ANGIOGRAPHY N/A 09/27/2018   Procedure: LEFT HEART  CATH AND CORONARY ANGIOGRAPHY;  Surgeon: Troy Sine, MD;  Location: [place] [facility];  Service: Cardiovascular;  Laterality: N/A;  . LEFT HEART CATHETERIZATION WITH CORONARY ANGIOGRAM N/A 10/16/2014   Procedure: LEFT HEART CATHETERIZATION WITH CORONARY ANGIOGRAM;  Surgeon: Blane Ohara, MD;  Location[facility] CATH LAB;  Service: Cardiovascular;  Laterality: N/A;  . Left L4-5 transforaminal lumbar interbody fusion with Depuy cage, rods and screws, local and allograft bone graft, Vivigen; bilateral decompression/partial hemilaminectomy lumbar five-sacral one  07/2017  . PERCUTANEOUS CORONARY ROTOBLATOR INTERVENTION (PCI-R) N/A 10/17/2014   Procedure: PERCUTANEOUS CORONARY ROTOBLATOR INTERVENTION (PCI-R);  Surgeon: Troy Sine, MD;  Location: [facility] CATH LAB;  Service: Cardiovascular;  Laterality: N/A;  . TEE WITHOUT CARDIOVERSION N/A 10/04/2018   Procedure: TRANSESOPHAGEAL ECHOCARDIOGRAM (TEE);  Surgeon: [name], MD;  Location: [place];  Service: Open Heart Surgery;  Laterality: N/A;  . VEIN LIGATION AND STRIPPING Left   . VIDEO BRONCHOSCOPY Bilateral 02/05/2015   Procedure: VIDEO BRONCHOSCOPY WITHOUT FLUORO;  Surgeon: Tanda Rockers, MD;  Location: [facility]OS[facility];  Service: Endoscopy;  Laterality: Bilateral;  . VULVA /PERINEUM BIOPSY  12-30-10   --epidermoid cyst    I have reviewed the social history and family history with the patient and they are unchanged from previous note.  ALLERGIES:  is allergic to fish-derived products, penicillins, aspirin, brilinta [ticagrelor], codeine phosphate, and diphenhydramine hcl.  MEDICATIONS:  Current Outpatient Medications  Medication Sig Dispense Refill  . albuterol (VENTOLIN HFA) 108 (90 Base) MCG/ACT inhaler INHALE 1 TO 2 PUFFS BY MOUTH EVERY 4 HOURS AS NEEDED FOR WHEEZING 8.5 g 0  . atorvastatin (LIPITOR) 40 MG tablet Take 1 tablet (40 mg total) by mouth daily. 90 tablet 3  . bisoprolol (ZEBETA) 10 MG tablet TAKE 1 TABLET(10 MG) BY MOUTH DAILY 90 tablet 2  . bisoprolol (ZEBETA) 5 MG tablet Take 1 tablet (5 mg total) by mouth daily. 90 tablet 3  . budesonide-formoterol (SYMBICORT) 160-4.5 MCG/ACT inhaler INHALE 2 PUFFS BY MOUTH EVERY 12 HOURS 30.6 g 3  . cetirizine (ZYRTEC) 10 MG tablet Take 10 mg by mouth at bedtime as  needed for allergies.    . cholecalciferol 2000 units TABS Take 1 tablet (2,000 Units total) by mouth daily. 30 tablet 3  . clopidogrel (PLAVIX) 75 MG tablet TAKE 1 TABLET(75 MG) BY MOUTH DAILY 90 tablet 3  . dexamethasone (DECADRON) 4 MG tablet Take 1 tablet (4 mg total) by mouth daily. May stop after 4-5 days if appetite and energy improves 10 tablet 0  . dextromethorphan-guaiFENesin (MUCINEX DM) 30-600 MG per 12 hr tablet Take 1-2 tablets by mouth every 12 (twelve) hours as needed for cough (with flutter).    . diphenoxylate-atropine (LOMOTIL) 2.5-0.025 MG tablet Take 1-2 tablets by mouth 4 (four) times daily as needed for diarrhea or loose stools. 60 tablet 0  . famotidine (PEPCID) 20 MG tablet Take 20 mg by mouth as needed.     . ferrous sulfate 325 (65 FE) MG tablet Take 650 mg by mouth daily with breakfast.     . fluticasone (FLONASE) 50 MCG/ACT nasal spray Place 1-2 sprays into both nostrils 2 (two) times daily as needed for allergies or rhinitis.    Marland Kitchen lidocaine-prilocaine (EMLA) cream APPLY EXTERNALLY TO THE AFFECTED AREA ONCE DAILY AS DIRECTED 30 g 3  . magic mouthwash SOLN Take 5 mLs by mouth 4 (four) times daily. Leave Benadryl out of compound since pt allergic to it. Use hydrocortisone & nystatin. Swish &  swallow or spit for thrush 240 mL 1  . magnesium 30 MG tablet Take 1 tablet (30 mg total) by mouth 2 (two) times daily. 30 tablet 0  . MAGNESIUM-OXIDE 400 (241.3 Mg) MG tablet TAKE 1 TABLET(400 MG) BY MOUTH THREE TIMES DAILY 90 tablet 2  . meclizine (ANTIVERT) 25 MG tablet TAKE 2 TABLETS BY MOUTH THREE TIMES DAILY AS NEEDED 24 tablet 2  . megestrol (MEGACE ES) 625 MG/5ML suspension Take 5 mLs (625 mg total) by mouth daily. 150 mL 0  . Multiple Vitamin (MULTIVITAMIN) capsule Take 1 capsule by mouth daily.    . ondansetron (ZOFRAN) 8 MG tablet Take 1 tablet (8 mg total) by mouth every 8 (eight) hours as needed for nausea or vomiting. 30 tablet 1  . oxyCODONE (OXY IR/ROXICODONE) 5 MG  immediate release tablet Take 1 tablet (5 mg total) by mouth every 8 (eight) hours as needed for severe pain. 45 tablet 0  . prochlorperazine (COMPAZINE) 10 MG tablet Take 1 tablet (10 mg total) by mouth every 6 (six) hours as needed (Nausea or vomiting). 30 tablet 1  . sacubitril-valsartan (ENTRESTO) 24-26 MG Take 1 tablet by mouth 2 (two) times daily. 180 tablet 3   No current facility-administered medications for this visit.    PHYSICAL EXAMINATION: ECOG PERFORMANCE STATUS: 2 - Symptomatic, <50% confined to bed  Vitals:   01/21/21 0843  BP: 110/70  Pulse: (!) 103  Resp: 16  Temp: 97.6 F (36.4 C)  SpO2: 100%   Filed Weights   01/21/21 0843  Weight: 103 lb 12.8 oz (47.1 kg)    Due to COVID19 we will limit examination to appearance. Patient had no complaints.  GENERAL:alert, no distress and comfortable SKIN: skin color normal, no rashes or significant lesions EYES: normal, Conjunctiva are pink and non-injected, sclera clear  NEURO: alert & oriented x 3 with fluent speech   LABORATORY DATA:  I have reviewed the data as listed CBC Latest Ref Rng & Units 01/21/2021 12/24/2020 12/10/2020  WBC 4.0 - 10.5 K/uL 10.8(H) 7.7 6.8  Hemoglobin 12.0 - 15.0 g/dL 11.0(L) 11.1(L) 11.6(L)  Hematocrit 36.0 - 46.0 % 32.8(L) 33.5(L) 35.1(L)  Platelets 150 - 400 K/uL 469(H) 434(H) 399     CMP Latest Ref Rng & Units 01/21/2021 12/24/2020 12/10/2020  Glucose 70 - 99 mg/dL 136(H) 127(H) 121(H)  BUN 8 - 23 mg/dL 10 15 12   Creatinine 0.44 - 1.00 mg/dL 0.81 0.97 0.95  Sodium 135 - 145 mmol/L 135 136 140  Potassium 3.5 - 5.1 mmol/L 3.7 4.0 4.5  Chloride 98 - 111 mmol/L 99 100 105  CO2 22 - 32 mmol/L 25 25 24   Calcium 8.9 - 10.3 mg/dL 9.2 9.4 10.2  Total Protein 6.5 - 8.1 g/dL 7.3 7.8 7.8  Total Bilirubin 0.3 - 1.2 mg/dL 1.2 0.5 0.5  Alkaline Phos 38 - 126 U/L 603(H) 82 79  AST 15 - 41 U/L 182(HH) 40 44(H)  ALT 0 - 44 U/L 99(H) 17 22      RADIOGRAPHIC STUDIES: I have personally reviewed the  radiological images as listed and agreed with the findings in the report. No results found.   ASSESSMENT & PLAN:  VIVION ROMANO is a 80 y.o. 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 -She is also onZometa q87monthstarting 01/26/19.  Switched to monthly Xgeva on 04/03/20 due to kidney function.Given increased bone pain, will reduce to every 3 months (q12weeks) from 10/10/20, due to bone pain. -She progressed on second-line Xeloda based on 04/2020 PET after 4-5 months of treatmentand more recent progressed on CAPOX after 5 months of treatment.  -She tried third-linetherapy with Stivarga 3 weeks on/1 week off beginning 11/10/20. She tolerated first week of Stivarga at 51m well with fatigue. She completed only 5 days of 837mbefore stopping pills due to side effects of epistaxis, fatigue, increased SOB, weight loss and diarrhea.  -Given her interested in continuing treatment, I started her on irinotecan q2weeks beginning 12/25/19.  -S/p C1 she had 1 week of diarrhea and moderate fatigue. She was able to recover. However she also notes left leg pain and lower abdominal pain post prandial nightly. I discussed her pain is likely related to her cancer. I will reduce chemo to see if she can continue tolerable treatment to control her disease.  -If her tumor marker does not indicate her responding to treatment I will scan her sooner than standard 2 months.  -I discussed if she is no longer able to tolerate treatment or the benefit of treatment is too low, I will stop treatment. At that time I will recommend Hospice home care. I encouraged her to have palliative care at home, she will think about it  -Labs reviewed, WBC 10.8, Hg 11, plt 469K, ANC 9.3.  Overall adequate to proceed with C1 Irinotecan today  with reduced dose.  -Continue Xgeva injection every 3 months, last in 09/2021, due today  -f/u in 2 weeks before cycle 3 -will f/u CA19.9 closely    2. Symptom Management: Diarrhea, fatigue, weight loss -Symptoms improved off Stivarga, but now moderate on Irinotecan. Will reduce chemo dose.  -She has imodium for diarrhea. I have called in Lomotil (02/10/21).  -Her diarrhea and fatigue was able to recover after 1-2 weeks.  -With recent lower abdominal pain, mainly post prandial, her appetite has decreased again. She also notes left leg pain. I discussed her pain is likely related to her cancer. She can continue PPI and oxycodone as needed.  -She also has megace to help her appetite. High calorie and protein diet with nutritional supplements have been reviewed.  -I discussed the option of palliative home care given she lives alone. She declined for now but will think about it.   3.CAD, S/P CABGX2 on 09/27/2018, EF25-30% -Currently on Plavix.  -f/u with Dr. KeClaiborne Billings4. Asthma, dyspnea -Currently on Symbicort, Singulair, and albuterol as needed -f/u with pulmonary, stable, no recent flare. Stable.  5. Mild Anemia -Secondary to chemoandheropen heart surgery -Mild and stable.   6. Goal of care discussion, Social support, DNR -The patient understands the goal of care is palliative.She agreed to DNR/DNI -She lives alone. Her only close relative is her daughter, but she is not reliable.  -She may consider moving to nursing home if she needs more help at home. I discussed if she is not able to tolerate treatment will stop. She will be eligible for hospice home care at that time.  -She has walker at home if needed. I encouraged her to carry her phone with her at all times.  -I discussed option of palliative home care soon, she declined for now but will think about it.   Plan -I called in lomotil today  --Labs reviewed and  adequate to proceed with C2 Irinotecan today at reduced dose 11062m2  -Lab, flush,  F/u and Irinotecan in 2, 4 weeks  -Xgeva q12weeks, due today  -we discussed home palliative care, she will think about it -I encouraged her to discuss with her daughter    No problem-specific Assessment & Plan notes found for this encounter.   No orders of the defined types were placed in this encounter.  All questions were answered. The patient knows to call the clinic with any problems, questions or concerns. No barriers to learning was detected. The total time spent in the appointment was 30 minutes.     Truitt Merle, MD 01/21/2021   I, Joslyn Devon, am acting as scribe for Truitt Merle, MD.   I have reviewed the above documentation for accuracy and completeness, and I agree with the above.

## 2021-01-20 ENCOUNTER — Other Ambulatory Visit: Payer: Self-pay | Admitting: Internal Medicine

## 2021-01-21 ENCOUNTER — Other Ambulatory Visit: Payer: Self-pay

## 2021-01-21 ENCOUNTER — Telehealth: Payer: Self-pay

## 2021-01-21 ENCOUNTER — Other Ambulatory Visit: Payer: Self-pay | Admitting: Internal Medicine

## 2021-01-21 ENCOUNTER — Inpatient Hospital Stay: Payer: Medicare Other

## 2021-01-21 ENCOUNTER — Inpatient Hospital Stay: Payer: Medicare Other | Attending: Obstetrics

## 2021-01-21 ENCOUNTER — Inpatient Hospital Stay (HOSPITAL_BASED_OUTPATIENT_CLINIC_OR_DEPARTMENT_OTHER): Payer: Medicare Other | Admitting: Hematology

## 2021-01-21 VITALS — HR 93

## 2021-01-21 VITALS — BP 110/70 | HR 103 | Temp 97.6°F | Resp 16 | Ht 59.0 in | Wt 103.8 lb

## 2021-01-21 DIAGNOSIS — Z7189 Other specified counseling: Secondary | ICD-10-CM

## 2021-01-21 DIAGNOSIS — M8580 Other specified disorders of bone density and structure, unspecified site: Secondary | ICD-10-CM | POA: Diagnosis not present

## 2021-01-21 DIAGNOSIS — C7951 Secondary malignant neoplasm of bone: Secondary | ICD-10-CM | POA: Insufficient documentation

## 2021-01-21 DIAGNOSIS — D649 Anemia, unspecified: Secondary | ICD-10-CM | POA: Diagnosis not present

## 2021-01-21 DIAGNOSIS — Z5111 Encounter for antineoplastic chemotherapy: Secondary | ICD-10-CM | POA: Insufficient documentation

## 2021-01-21 DIAGNOSIS — C772 Secondary and unspecified malignant neoplasm of intra-abdominal lymph nodes: Secondary | ICD-10-CM | POA: Diagnosis not present

## 2021-01-21 DIAGNOSIS — C23 Malignant neoplasm of gallbladder: Secondary | ICD-10-CM | POA: Diagnosis present

## 2021-01-21 DIAGNOSIS — I2510 Atherosclerotic heart disease of native coronary artery without angina pectoris: Secondary | ICD-10-CM | POA: Insufficient documentation

## 2021-01-21 LAB — CMP (CANCER CENTER ONLY)
ALT: 99 U/L — ABNORMAL HIGH (ref 0–44)
AST: 182 U/L (ref 15–41)
Albumin: 2.4 g/dL — ABNORMAL LOW (ref 3.5–5.0)
Alkaline Phosphatase: 603 U/L — ABNORMAL HIGH (ref 38–126)
Anion gap: 11 (ref 5–15)
BUN: 10 mg/dL (ref 8–23)
CO2: 25 mmol/L (ref 22–32)
Calcium: 9.2 mg/dL (ref 8.9–10.3)
Chloride: 99 mmol/L (ref 98–111)
Creatinine: 0.81 mg/dL (ref 0.44–1.00)
GFR, Estimated: >60 mL/min (ref >60)
Glucose, Bld: 136 mg/dL — ABNORMAL HIGH (ref 70–99)
Potassium: 3.7 mmol/L (ref 3.5–5.1)
Sodium: 135 mmol/L (ref 135–145)
Total Bilirubin: 1.2 mg/dL (ref 0.3–1.2)
Total Protein: 7.3 g/dL (ref 6.5–8.1)

## 2021-01-21 LAB — CBC WITH DIFFERENTIAL (CANCER CENTER ONLY)
Abs Immature Granulocytes: 0.05 10*3/uL (ref 0.00–0.07)
Basophils Absolute: 0 10*3/uL (ref 0.0–0.1)
Basophils Relative: 0 %
Eosinophils Absolute: 0.3 10*3/uL (ref 0.0–0.5)
Eosinophils Relative: 2 %
HCT: 32.8 % — ABNORMAL LOW (ref 36.0–46.0)
Hemoglobin: 11 g/dL — ABNORMAL LOW (ref 12.0–15.0)
Immature Granulocytes: 1 %
Lymphocytes Relative: 5 %
Lymphs Abs: 0.5 10*3/uL — ABNORMAL LOW (ref 0.7–4.0)
MCH: 31.2 pg (ref 26.0–34.0)
MCHC: 33.5 g/dL (ref 30.0–36.0)
MCV: 92.9 fL (ref 80.0–100.0)
Monocytes Absolute: 0.7 10*3/uL (ref 0.1–1.0)
Monocytes Relative: 6 %
Neutro Abs: 9.3 10*3/uL — ABNORMAL HIGH (ref 1.7–7.7)
Neutrophils Relative %: 86 %
Platelet Count: 469 10*3/uL — ABNORMAL HIGH (ref 150–400)
RBC: 3.53 MIL/uL — ABNORMAL LOW (ref 3.87–5.11)
RDW: 16.3 % — ABNORMAL HIGH (ref 11.5–15.5)
WBC Count: 10.8 10*3/uL — ABNORMAL HIGH (ref 4.0–10.5)
nRBC: 0 % (ref 0.0–0.2)

## 2021-01-21 LAB — MAGNESIUM: Magnesium: 1.5 mg/dL — ABNORMAL LOW (ref 1.7–2.4)

## 2021-01-21 MED ORDER — BUDESONIDE-FORMOTEROL FUMARATE 160-4.5 MCG/ACT IN AERO: INHALATION_SPRAY | RESPIRATORY_TRACT | 3 refills | Status: AC

## 2021-01-21 MED ORDER — ATROPINE SULFATE 1 MG/ML IJ SOLN
0.5000 mg | Freq: Once | INTRAMUSCULAR | Status: AC
  Administered 2021-01-21: 0.5 mg via INTRAVENOUS

## 2021-01-21 MED ORDER — DENOSUMAB 120 MG/1.7ML ~~LOC~~ SOLN
SUBCUTANEOUS | Status: AC
  Filled 2021-01-21: qty 1.7

## 2021-01-21 MED ORDER — IRINOTECAN HCL CHEMO INJECTION 100 MG/5ML
110.0000 mg/m2 | Freq: Once | INTRAVENOUS | Status: DC
Start: 2021-01-21 — End: 2021-01-21

## 2021-01-21 MED ORDER — PALONOSETRON HCL INJECTION 0.25 MG/5ML
0.2500 mg | Freq: Once | INTRAVENOUS | Status: AC
  Administered 2021-01-21: 0.25 mg via INTRAVENOUS

## 2021-01-21 MED ORDER — ATROPINE SULFATE 1 MG/ML IJ SOLN
INTRAMUSCULAR | Status: AC
  Filled 2021-01-21: qty 1

## 2021-01-21 MED ORDER — SODIUM CHLORIDE 0.9 % IV SOLN
80.0000 mg/m2 | Freq: Once | INTRAVENOUS | Status: AC
  Administered 2021-01-21: 120 mg via INTRAVENOUS
  Filled 2021-01-21: qty 6

## 2021-01-21 MED ORDER — SODIUM CHLORIDE 0.9 % IV SOLN
Freq: Once | INTRAVENOUS | Status: AC
  Filled 2021-01-21: qty 250

## 2021-01-21 MED ORDER — SODIUM CHLORIDE 0.9 % IV SOLN
10.0000 mg | Freq: Once | INTRAVENOUS | Status: AC
  Administered 2021-01-21: 10 mg via INTRAVENOUS
  Filled 2021-01-21: qty 10
  Filled 2021-01-21: qty 1

## 2021-01-21 MED ORDER — PALONOSETRON HCL INJECTION 0.25 MG/5ML
INTRAVENOUS | Status: AC
  Filled 2021-01-21: qty 5

## 2021-01-21 MED ORDER — HEPARIN SOD (PORK) LOCK FLUSH 100 UNIT/ML IV SOLN
500.0000 [IU] | Freq: Once | INTRAVENOUS | Status: AC | PRN
  Administered 2021-01-21: 500 [IU]
  Filled 2021-01-21: qty 5

## 2021-01-21 MED ORDER — SODIUM CHLORIDE 0.9% FLUSH
10.0000 mL | INTRAVENOUS | Status: DC | PRN
  Administered 2021-01-21: 3 mL
  Filled 2021-01-21: qty 10

## 2021-01-21 MED ORDER — DIPHENOXYLATE-ATROPINE 2.5-0.025 MG PO TABS: 1.0000 | ORAL_TABLET | Freq: Four times a day (QID) | ORAL | 0 refills | Status: AC | PRN

## 2021-01-21 MED ORDER — DENOSUMAB 120 MG/1.7ML ~~LOC~~ SOLN
120.0000 mg | Freq: Once | SUBCUTANEOUS | Status: AC
  Administered 2021-01-21: 120 mg via SUBCUTANEOUS

## 2021-01-21 NOTE — Telephone Encounter (Signed)
Critical Value: AST 182 Dr Burr Medico notified

## 2021-01-21 NOTE — Progress Notes (Signed)
Confirmed dose reduction due to LFT's to be:  Irinotecan 80 mg/m2 today's dose.  T.O. Dr Dionicia Abler, PharmD 01/21/21 @ [phone]

## 2021-01-21 NOTE — Patient Instructions (Signed)
[facility] Discharge Instructions for Patients Receiving Chemotherapy  Today you received the following chemotherapy agents: Irinotecan.  To help prevent nausea and vomiting after your treatment, we encourage you to take your nausea medication as directed.   If you develop nausea and vomiting that is not controlled by your nausea medication, call the clinic.   BELOW ARE SYMPTOMS THAT SHOULD BE REPORTED IMMEDIATELY:  *FEVER GREATER THAN 100.5 F  *CHILLS WITH OR WITHOUT FEVER  NAUSEA AND VOMITING THAT IS NOT CONTROLLED WITH YOUR NAUSEA MEDICATION  *UNUSUAL SHORTNESS OF BREATH  *UNUSUAL BRUISING OR BLEEDING  TENDERNESS IN MOUTH AND THROAT WITH OR WITHOUT PRESENCE OF ULCERS  *URINARY PROBLEMS  *BOWEL PROBLEMS  UNUSUAL RASH Items with * indicate a potential emergency and should be followed up as soon as possible.  Feel free to call the clinic should you have any questions or concerns. The clinic phone number is [phone].  Please show the [facility] at check-in to the Emergency Department and triage nurse.  Irinotecan injection What is this medicine? IRINOTECAN (ir in oh TEE kan ) is a chemotherapy drug. It is used to treat colon and rectal cancer. This medicine may be used for other purposes; ask your health care provider or pharmacist if you have questions. COMMON BRAND NAME(S): Camptosar What should I tell my health care provider before I take this medicine? They need to know if you have any of these conditions:  dehydration  diarrhea  infection (especially a virus infection such as chickenpox, cold sores, or herpes)  liver disease  low blood counts, like low white cell, platelet, or red cell counts  low levels of calcium, magnesium, or potassium in the blood  recent or ongoing radiation therapy  an unusual or allergic reaction to irinotecan, other medicines, foods, dyes, or preservatives  pregnant or trying to get  pregnant  breast-feeding How should I use this medicine? This drug is given as an infusion into a vein. It is administered in a hospital or clinic by a specially trained health care professional. Talk to your pediatrician regarding the use of this medicine in children. Special care may be needed. Overdosage: If you think you have taken too much of this medicine contact a poison control center or emergency room at once. NOTE: This medicine is only for you. Do not share this medicine with others. What if I miss a dose? It is important not to miss your dose. Call your doctor or health care professional if you are unable to keep an appointment. What may interact with this medicine? This medicine may interact with the following medications:  antiviral medicines for HIV or AIDS  certain antibiotics like rifampin or rifabutin  certain medicines for fungal infections like itraconazole, ketoconazole, posaconazole, and voriconazole  certain medicines for seizures like carbamazepine, phenobarbital, phenotoin  clarithromycin  gemfibrozil  nefazodone  St. John's Wort This list may not describe all possible interactions. Give your health care provider a list of all the medicines, herbs, non-prescription drugs, or dietary supplements you use. Also tell them if you smoke, drink alcohol, or use illegal drugs. Some items may interact with your medicine. What should I watch for while using this medicine? Your condition will be monitored carefully while you are receiving this medicine. You will need important blood work done while you are taking this medicine. This drug may make you feel generally unwell. This is not uncommon, as chemotherapy can affect healthy cells as well as cancer cells. Report  any side effects. Continue your course of treatment even though you feel ill unless your doctor tells you to stop. In some cases, you may be given additional medicines to help with side effects. Follow all  directions for their use. You may get drowsy or dizzy. Do not drive, use machinery, or do anything that needs mental alertness until you know how this medicine affects you. Do not stand or sit up quickly, especially if you are an older patient. This reduces the risk of dizzy or fainting spells. Call your health care professional for advice if you get a fever, chills, or sore throat, or other symptoms of a cold or flu. Do not treat yourself. This medicine decreases your body's ability to fight infections. Try to avoid being around people who are sick. Avoid taking products that contain aspirin, acetaminophen, ibuprofen, naproxen, or ketoprofen unless instructed by your doctor. These medicines may hide a fever. This medicine may increase your risk to bruise or bleed. Call your doctor or health care professional if you notice any unusual bleeding. Be careful brushing and flossing your teeth or using a toothpick because you may get an infection or bleed more easily. If you have any dental work done, tell your dentist you are receiving this medicine. Do not become pregnant while taking this medicine or for 6 months after stopping it. Women should inform their health care professional if they wish to become pregnant or think they might be pregnant. Men should not father a child while taking this medicine and for 3 months after stopping it. There is potential for serious side effects to an unborn child. Talk to your health care professional for more information. Do not breast-feed an infant while taking this medicine or for 7 days after stopping it. This medicine has caused ovarian failure in some women. This medicine may make it more difficult to get pregnant. Talk to your health care professional if you are concerned about your fertility. This medicine has caused decreased sperm counts in some men. This may make it more difficult to father a child. Talk to your health care professional if you are concerned about  your fertility. What side effects may I notice from receiving this medicine? Side effects that you should report to your doctor or health care professional as soon as possible:  allergic reactions like skin rash, itching or hives, swelling of the face, lips, or tongue  chest pain  diarrhea  flushing, runny nose, sweating during infusion  low blood counts - this medicine may decrease the number of white blood cells, red blood cells and platelets. You may be at increased risk for infections and bleeding.  nausea, vomiting  pain, swelling, warmth in the leg  signs of decreased platelets or bleeding - bruising, pinpoint red spots on the skin, black, tarry stools, blood in the urine  signs of infection - fever or chills, cough, sore throat, pain or difficulty passing urine  signs of decreased red blood cells - unusually weak or tired, fainting spells, lightheadedness Side effects that usually do not require medical attention (report to your doctor or health care professional if they continue or are bothersome):  constipation  hair loss  headache  loss of appetite  mouth sores  stomach pain This list may not describe all possible side effects. Call your doctor for medical advice about side effects. You may report side effects to FDA at 1-800-FDA-1088. Where should I keep my medicine? This drug is given in a hospital or clinic  and will not be stored at home. NOTE: This sheet is a summary. It may not cover all possible information. If you have questions about this medicine, talk to your doctor, pharmacist, or health care provider.  2020 [employer] (2019-01-26 10:09:17)

## 2021-01-21 NOTE — Progress Notes (Signed)
Per Dr. Burr Medico, ok to treat with AST 182 and ALT 99.

## 2021-01-22 ENCOUNTER — Telehealth: Payer: Self-pay | Admitting: Hematology

## 2021-01-22 LAB — CANCER ANTIGEN 19-9: CA 19-9: 15134 U/mL — ABNORMAL HIGH (ref 0–35)

## 2021-01-22 NOTE — Telephone Encounter (Signed)
Scheduled appts per 2/2 los. Pt to get updated appt calendar at next visit per appt notes.  

## 2021-01-27 ENCOUNTER — Telehealth: Payer: Self-pay

## 2021-01-27 NOTE — Telephone Encounter (Signed)
I received a phone call today from Ms Cassie Campbell.  She stated that Ms Cassie Campbell passed away on 03-20-2023.  She was found in her home.

## 2021-01-27 NOTE — Progress Notes (Unsigned)
Opened in error

## 2021-01-27 NOTE — Telephone Encounter (Signed)
I am so sorry to hear about this!  Truitt Merle MD

## 2021-01-28 NOTE — Telephone Encounter (Signed)
Very unexpected and sad. Thanks for letting us know. LB

## 2021-02-02 ENCOUNTER — Telehealth: Payer: Self-pay | Admitting: Cardiovascular Disease

## 2021-02-02 NOTE — Telephone Encounter (Signed)
Will route to MD/covering nurse to make aware- Also will route to med records to make aware this will be coming in.

## 2021-02-02 NOTE — Telephone Encounter (Signed)
We will be looking out for the death certificate to come in , Caren Griffins will get Dr. Claiborne Billings to sign it. Thanks 02/02/21 fsw

## 2021-02-02 NOTE — Telephone Encounter (Signed)
Crystal with [employer] needs Dr. Claiborne Billings to sign this patient's death certificate. Please advise.

## 2021-02-04 ENCOUNTER — Other Ambulatory Visit: Payer: Medicare Other

## 2021-02-04 ENCOUNTER — Ambulatory Visit: Payer: Medicare Other

## 2021-02-04 ENCOUNTER — Ambulatory Visit: Payer: Medicare Other | Admitting: Hematology

## 2021-02-05 NOTE — Telephone Encounter (Signed)
[place] will be bringing a copy of the death certificate to the office today

## 2021-02-09 ENCOUNTER — Other Ambulatory Visit: Payer: Self-pay | Admitting: Cardiovascular Disease

## 2021-02-10 NOTE — Telephone Encounter (Signed)
Discussed with Dr Claiborne Billings, needs to get signed from oncologist who saw patient 01/21/21 Given to Caren Griffins to reach out to funeral home

## 2021-02-17 DEATH — deceased

## 2021-02-18 ENCOUNTER — Other Ambulatory Visit: Payer: Medicare Other

## 2021-02-18 ENCOUNTER — Ambulatory Visit: Payer: Medicare Other | Admitting: Hematology

## 2021-02-18 ENCOUNTER — Inpatient Hospital Stay: Payer: Medicare Other

## 2021-02-18 ENCOUNTER — Ambulatory Visit: Payer: Medicare Other

## 2021-04-06 ENCOUNTER — Ambulatory Visit: Payer: Medicare Other | Admitting: Cardiovascular Disease

## 2023-10-03 NOTE — Telephone Encounter (Signed)
error
